# Patient Record
Sex: Female | Born: 1945 | Race: White | Hispanic: No | Marital: Married | Smoking: Current every day smoker
Health system: Southern US, Community
[De-identification: ages and names within clinical notes are randomized; demographics above are authoritative.]

## PROBLEM LIST (undated history)

## (undated) DIAGNOSIS — Z720 Tobacco use: Secondary | ICD-10-CM

## (undated) DIAGNOSIS — M1710 Unilateral primary osteoarthritis, unspecified knee: Secondary | ICD-10-CM

## (undated) DIAGNOSIS — I10 Essential (primary) hypertension: Secondary | ICD-10-CM

## (undated) DIAGNOSIS — R945 Abnormal results of liver function studies: Secondary | ICD-10-CM

## (undated) DIAGNOSIS — I2510 Atherosclerotic heart disease of native coronary artery without angina pectoris: Secondary | ICD-10-CM

## (undated) DIAGNOSIS — Z8701 Personal history of pneumonia (recurrent): Secondary | ICD-10-CM

## (undated) DIAGNOSIS — F1010 Alcohol abuse, uncomplicated: Secondary | ICD-10-CM

## (undated) DIAGNOSIS — I219 Acute myocardial infarction, unspecified: Secondary | ICD-10-CM

## (undated) DIAGNOSIS — M179 Osteoarthritis of knee, unspecified: Secondary | ICD-10-CM

## (undated) DIAGNOSIS — Q396 Congenital diverticulum of esophagus: Secondary | ICD-10-CM

## (undated) DIAGNOSIS — E785 Hyperlipidemia, unspecified: Secondary | ICD-10-CM

## (undated) DIAGNOSIS — J439 Emphysema, unspecified: Secondary | ICD-10-CM

## (undated) DIAGNOSIS — J449 Chronic obstructive pulmonary disease, unspecified: Secondary | ICD-10-CM

## (undated) DIAGNOSIS — M1990 Unspecified osteoarthritis, unspecified site: Secondary | ICD-10-CM

## (undated) DIAGNOSIS — R7989 Other specified abnormal findings of blood chemistry: Secondary | ICD-10-CM

## (undated) DIAGNOSIS — Z8679 Personal history of other diseases of the circulatory system: Secondary | ICD-10-CM

## (undated) DIAGNOSIS — E119 Type 2 diabetes mellitus without complications: Secondary | ICD-10-CM

## (undated) DIAGNOSIS — C50919 Malignant neoplasm of unspecified site of unspecified female breast: Secondary | ICD-10-CM

## (undated) DIAGNOSIS — G459 Transient cerebral ischemic attack, unspecified: Secondary | ICD-10-CM

## (undated) HISTORY — DX: Malignant neoplasm of unspecified site of unspecified female breast: C50.919

## (undated) HISTORY — DX: Hyperlipidemia, unspecified: E78.5

## (undated) HISTORY — DX: Acute myocardial infarction, unspecified: I21.9

## (undated) HISTORY — PX: MASTECTOMY: SHX3

## (undated) HISTORY — DX: Abnormal results of liver function studies: R94.5

## (undated) HISTORY — DX: Osteoarthritis of knee, unspecified: M17.9

## (undated) HISTORY — PX: EYE SURGERY: SHX253

## (undated) HISTORY — DX: Alcohol abuse, uncomplicated: F10.10

## (undated) HISTORY — DX: Essential (primary) hypertension: I10

## (undated) HISTORY — DX: Unspecified osteoarthritis, unspecified site: M19.90

## (undated) HISTORY — DX: Atherosclerotic heart disease of native coronary artery without angina pectoris: I25.10

## (undated) HISTORY — DX: Chronic obstructive pulmonary disease, unspecified: J44.9

## (undated) HISTORY — PX: TONSILLECTOMY AND ADENOIDECTOMY: SUR1326

## (undated) HISTORY — PX: HYSTEROSCOPY: SHX211

## (undated) HISTORY — DX: Tobacco use: Z72.0

## (undated) HISTORY — DX: Other specified abnormal findings of blood chemistry: R79.89

## (undated) HISTORY — DX: Unilateral primary osteoarthritis, unspecified knee: M17.10

---

## 1979-09-16 HISTORY — PX: VULVECTOMY: SHX1086

## 1989-09-15 HISTORY — PX: BREAST SURGERY: SHX581

## 1998-08-15 HISTORY — PX: CORONARY ANGIOPLASTY: SHX604

## 1998-08-19 ENCOUNTER — Encounter: Payer: Self-pay | Admitting: Emergency Medicine

## 1998-08-19 ENCOUNTER — Inpatient Hospital Stay (HOSPITAL_COMMUNITY): Admission: EM | Admit: 1998-08-19 | Discharge: 1998-08-22 | Payer: Self-pay | Admitting: Emergency Medicine

## 1998-09-03 ENCOUNTER — Ambulatory Visit (HOSPITAL_COMMUNITY): Admission: RE | Admit: 1998-09-03 | Discharge: 1998-09-03 | Payer: Self-pay | Admitting: Cardiology

## 1998-09-15 DIAGNOSIS — I219 Acute myocardial infarction, unspecified: Secondary | ICD-10-CM

## 1998-09-15 HISTORY — DX: Acute myocardial infarction, unspecified: I21.9

## 1998-10-19 ENCOUNTER — Inpatient Hospital Stay (HOSPITAL_COMMUNITY): Admission: EM | Admit: 1998-10-19 | Discharge: 1998-10-23 | Payer: Self-pay | Admitting: Emergency Medicine

## 1998-10-19 ENCOUNTER — Encounter: Payer: Self-pay | Admitting: Cardiology

## 1999-01-14 HISTORY — PX: CORONARY ANGIOPLASTY WITH STENT PLACEMENT: SHX49

## 1999-01-25 ENCOUNTER — Observation Stay (HOSPITAL_COMMUNITY): Admission: AD | Admit: 1999-01-25 | Discharge: 1999-01-26 | Payer: Self-pay | Admitting: Cardiovascular Disease

## 1999-07-17 HISTORY — PX: CORONARY ANGIOPLASTY WITH STENT PLACEMENT: SHX49

## 1999-07-19 ENCOUNTER — Ambulatory Visit (HOSPITAL_COMMUNITY): Admission: RE | Admit: 1999-07-19 | Discharge: 1999-07-20 | Payer: Self-pay | Admitting: Cardiology

## 1999-08-27 ENCOUNTER — Encounter (HOSPITAL_COMMUNITY): Admission: RE | Admit: 1999-08-27 | Discharge: 1999-11-25 | Payer: Self-pay | Admitting: Cardiology

## 1999-12-13 ENCOUNTER — Encounter: Admission: RE | Admit: 1999-12-13 | Discharge: 1999-12-13 | Payer: Self-pay | Admitting: Family Medicine

## 1999-12-13 ENCOUNTER — Encounter: Payer: Self-pay | Admitting: Family Medicine

## 2000-02-10 ENCOUNTER — Inpatient Hospital Stay (HOSPITAL_COMMUNITY): Admission: EM | Admit: 2000-02-10 | Discharge: 2000-02-12 | Payer: Self-pay | Admitting: *Deleted

## 2000-02-10 ENCOUNTER — Encounter: Payer: Self-pay | Admitting: *Deleted

## 2000-05-25 ENCOUNTER — Encounter (INDEPENDENT_AMBULATORY_CARE_PROVIDER_SITE_OTHER): Payer: Self-pay

## 2000-05-25 ENCOUNTER — Other Ambulatory Visit: Admission: RE | Admit: 2000-05-25 | Discharge: 2000-05-25 | Payer: Self-pay | Admitting: Obstetrics and Gynecology

## 2000-09-15 HISTORY — PX: PELVIC LAPAROSCOPY: SHX162

## 2001-08-05 ENCOUNTER — Encounter: Payer: Self-pay | Admitting: Family Medicine

## 2001-08-05 ENCOUNTER — Encounter: Admission: RE | Admit: 2001-08-05 | Discharge: 2001-08-05 | Payer: Self-pay | Admitting: Family Medicine

## 2001-08-09 ENCOUNTER — Other Ambulatory Visit: Admission: RE | Admit: 2001-08-09 | Discharge: 2001-08-09 | Payer: Self-pay | Admitting: Obstetrics and Gynecology

## 2001-09-03 ENCOUNTER — Encounter (INDEPENDENT_AMBULATORY_CARE_PROVIDER_SITE_OTHER): Payer: Self-pay

## 2001-09-03 ENCOUNTER — Ambulatory Visit (HOSPITAL_COMMUNITY): Admission: RE | Admit: 2001-09-03 | Discharge: 2001-09-03 | Payer: Self-pay | Admitting: Obstetrics and Gynecology

## 2002-08-18 ENCOUNTER — Inpatient Hospital Stay (HOSPITAL_COMMUNITY): Admission: EM | Admit: 2002-08-18 | Discharge: 2002-08-19 | Payer: Self-pay

## 2002-08-18 ENCOUNTER — Encounter: Payer: Self-pay | Admitting: Emergency Medicine

## 2002-09-29 ENCOUNTER — Encounter: Admission: RE | Admit: 2002-09-29 | Discharge: 2002-09-29 | Payer: Self-pay | Admitting: Obstetrics and Gynecology

## 2002-09-29 ENCOUNTER — Encounter: Payer: Self-pay | Admitting: Obstetrics and Gynecology

## 2004-06-13 ENCOUNTER — Inpatient Hospital Stay (HOSPITAL_COMMUNITY): Admission: EM | Admit: 2004-06-13 | Discharge: 2004-06-17 | Payer: Self-pay | Admitting: Emergency Medicine

## 2004-11-29 ENCOUNTER — Ambulatory Visit (HOSPITAL_COMMUNITY): Admission: RE | Admit: 2004-11-29 | Discharge: 2004-11-29 | Payer: Self-pay | Admitting: Family Medicine

## 2005-01-31 HISTORY — PX: CARDIOVASCULAR STRESS TEST: SHX262

## 2005-04-11 ENCOUNTER — Encounter: Admission: RE | Admit: 2005-04-11 | Discharge: 2005-04-11 | Payer: Self-pay | Admitting: Obstetrics and Gynecology

## 2008-10-20 ENCOUNTER — Inpatient Hospital Stay (HOSPITAL_COMMUNITY): Admission: EM | Admit: 2008-10-20 | Discharge: 2008-10-22 | Payer: Self-pay | Admitting: Emergency Medicine

## 2009-04-04 ENCOUNTER — Inpatient Hospital Stay (HOSPITAL_COMMUNITY): Admission: EM | Admit: 2009-04-04 | Discharge: 2009-04-05 | Payer: Self-pay | Admitting: Emergency Medicine

## 2009-04-05 HISTORY — PX: CARDIAC CATHETERIZATION: SHX172

## 2009-05-30 ENCOUNTER — Inpatient Hospital Stay (HOSPITAL_COMMUNITY): Admission: RE | Admit: 2009-05-30 | Discharge: 2009-05-31 | Payer: Self-pay | Admitting: Orthopedic Surgery

## 2009-09-13 ENCOUNTER — Encounter: Admission: RE | Admit: 2009-09-13 | Discharge: 2009-09-13 | Payer: Self-pay | Admitting: Orthopedic Surgery

## 2009-09-15 HISTORY — PX: OTHER SURGICAL HISTORY: SHX169

## 2010-04-02 ENCOUNTER — Ambulatory Visit: Payer: Self-pay | Admitting: Gynecology

## 2010-04-02 ENCOUNTER — Other Ambulatory Visit: Admission: RE | Admit: 2010-04-02 | Discharge: 2010-04-02 | Payer: Self-pay | Admitting: Gynecology

## 2010-04-02 ENCOUNTER — Encounter: Admission: RE | Admit: 2010-04-02 | Discharge: 2010-04-02 | Payer: Self-pay | Admitting: Family Medicine

## 2010-08-15 HISTORY — PX: TOTAL HIP ARTHROPLASTY: SHX124

## 2010-08-27 ENCOUNTER — Inpatient Hospital Stay (HOSPITAL_COMMUNITY)
Admission: RE | Admit: 2010-08-27 | Discharge: 2010-08-31 | Payer: Self-pay | Source: Home / Self Care | Attending: Orthopedic Surgery | Admitting: Orthopedic Surgery

## 2010-09-24 ENCOUNTER — Inpatient Hospital Stay (HOSPITAL_COMMUNITY)
Admission: EM | Admit: 2010-09-24 | Discharge: 2010-09-25 | Payer: Self-pay | Source: Home / Self Care | Attending: Orthopedic Surgery | Admitting: Orthopedic Surgery

## 2010-09-30 LAB — DIFFERENTIAL
Basophils Absolute: 0.1 10*3/uL (ref 0.0–0.1)
Basophils Relative: 1 % (ref 0–1)
Eosinophils Absolute: 0.1 10*3/uL (ref 0.0–0.7)
Eosinophils Relative: 1 % (ref 0–5)
Lymphocytes Relative: 24 % (ref 12–46)
Lymphs Abs: 2.3 10*3/uL (ref 0.7–4.0)
Monocytes Absolute: 0.9 10*3/uL (ref 0.1–1.0)
Monocytes Relative: 9 % (ref 3–12)
Neutro Abs: 6.2 10*3/uL (ref 1.7–7.7)
Neutrophils Relative %: 65 % (ref 43–77)

## 2010-09-30 LAB — BASIC METABOLIC PANEL
BUN: 6 mg/dL (ref 6–23)
CO2: 25 mEq/L (ref 19–32)
Calcium: 9.5 mg/dL (ref 8.4–10.5)
Chloride: 103 mEq/L (ref 96–112)
Creatinine, Ser: 0.49 mg/dL (ref 0.4–1.2)
GFR calc Af Amer: >60 mL/min (ref >60)
GFR calc non Af Amer: >60 mL/min (ref >60)
Glucose, Bld: 108 mg/dL — ABNORMAL HIGH (ref 70–99)
Potassium: 3.7 mEq/L (ref 3.5–5.1)
Sodium: 137 mEq/L (ref 135–145)

## 2010-09-30 LAB — GLUCOSE, CAPILLARY
Glucose-Capillary: 105 mg/dL — ABNORMAL HIGH (ref 70–99)
Glucose-Capillary: 144 mg/dL — ABNORMAL HIGH (ref 70–99)
Glucose-Capillary: 129 mg/dL — ABNORMAL HIGH (ref 70–99)

## 2010-09-30 LAB — CBC
HCT: 37.9 % (ref 36.0–46.0)
Hemoglobin: 13 g/dL (ref 12.0–15.0)
MCH: 32.3 pg (ref 26.0–34.0)
MCHC: 34.3 g/dL (ref 30.0–36.0)
MCV: 94 fL (ref 78.0–100.0)
Platelets: 259 10*3/uL (ref 150–400)
RBC: 4.03 MIL/uL (ref 3.87–5.11)
RDW: 14 % (ref 11.5–15.5)
WBC: 9.5 10*3/uL (ref 4.0–10.5)

## 2010-09-30 LAB — TYPE AND SCREEN
ABO/RH(D): O POS
Antibody Screen: NEGATIVE

## 2010-10-04 NOTE — Op Note (Signed)
[name], [name]                ACCOUNT NO.:  [id number]  MEDICAL RECORD NO.:  [id number]          PATIENT TYPE:  INP  LOCATION:  5031                         FACILITY:  [facility]  PHYSICIAN:  Alvy Beal, MD    DATE OF BIRTH:  27-Jun-1946  DATE OF PROCEDURE:  05/30/2009 DATE OF DISCHARGE:                              OPERATIVE REPORT  PREOPERATIVE DIAGNOSIS:  Cervical spondylotic radiculopathy.  POSTOPERATIVE DIAGNOSIS:  Cervical spondylotic radiculopathy.  OPERATIVE PROCEDURE:  Anterior cervical corpectomy of C5 with anterior cervical strut graft fusion using fibular allograft and an anterior cervical Synthes vector plate for D6-U4 fusion.  COMPLICATIONS:  None.  Evoked motor and sensory potentials showed some improvement on the left side.  Post decompression, no adverse intraoperative neuro monitoring events with evoked motor sensory and potentials as well as free running EMGs.  FIRST ASSISTANT:  Marlaine Hind.  HISTORY:  [name] is a very pleasant 65 year old woman who has been having progressive neck and radicular right arm pain and weakness.  Clinical and radiographic analysis confirmed the diagnosis of 2-level degenerative disk disease with significant posterior osteophyte behind the vertebral body of C5.  After discussing treatment options, the decision was made to be taken to the operating room for corpectomy. Because of the posterior vertebral location of the bone spur causing the right radicular arm pain, the decision was made to taken for corpectomy rather than diskectomy as I did not feel as though I could adequately decompress the nerve by doing just diskectomies alone.  This was explained to the patient and her family.  Consent was obtained and I did review all appropriate risks, benefits, and alternatives.  OPERATIVE NOTE:  The patient was brought to the operating room, placed supine on the operating table.  After successful induction of  general anesthesia, and an endotracheal intubation, TEDs, SCDs, and Foley were inserted, and all appropriate neuro monitoring devices were applied. The arms were tucked at the side and taped down.  A chin strap was applied.  The neck was prepped and draped in a standard fashion.  An appropriate pre-incision time-out was done confirming patient, procedure, and extremity of pain.  Once this was done, an incision was made.  The left lateral longitudinal incision was made along the medial border of the sternocleidomastoid.  Sharp dissection was carried out down to and through the platysma.  I continued to sharply dissect in the deep cervical fascia, sweeping the omohyoid medially as well as the trachea and esophagus.  I was able to visualize the carotid sheath, protected with finger and continued my sharp dissection to completely expose the prevertebral fascia.  At this point, an appendiceal retractor was placed and retracted the trachea and esophagus medially and I used a Pension scheme manager to remove the remaining prevertebral fascia to expose the anterior longitudinal ligament from the midbody of C4 down to the midbody of C6.  I then placed a needle into the 4-5 disk space, took an intraoperative x-ray and confirmed I was at the appropriate level.  Once this was confirmed, I used a bipolar electrocautery to begin to mobilize the longus colli muscles.  I mobilized them out lateral until I could see the uncovertebral joints at C4-5 and 5-6.  I then placed self- retaining Caspar retractor underneath the longus colli muscles and deflated the endotracheal cuff.  I expanded them to the appropriate width and reinflated the endotracheal cuff.  Distraction pins were placed into the bodies of C4 and C6 and I distracted the overall construct.  I incised the 4-5 disk with a 15- blade scalpel to proceed with my diskectomy.  Using pituitary rongeurs and curettes, and Kerrison rongeurs, I removed the  entire 4-5 disk material.  Once I had the majority of the diskectomy complete, I repeated the same diskectomy at C5-6.  This allowed me to complete the diskectomies above and below my corpectomy level.  I used a double-action Leksell rongeur to remove the bulk of the C5 vertebral body.  Once this was done, I used a high-speed bur to continue to create my channel corpectomy.  I measured using a neuro pattern to ensure I had an adequate width.  Once I had the width, I continued to resect the central portion of the vertebral body creating my channel corpectomy.  Once I was down to the posterior cortex, I proceeded with using a Kerrison rongeur.  At the disk 4-5 disk space, I swept a neuro-hook underneath the endplate of C5 and then placed my #1 Kerrison and began removing the posterior cortex of the C5.  Using technique of sweeping underneath the cortex with a nerve hook and then resecting with a 1-mm Kerrison, I removed the entire posterior cortex of C5.  I then developed a plane underneath the posterior longitudinal ligament and began resecting the posterior longitudinal ligament.  There was significant osteophyte along the right lateral gutter, starting about the midportion of C5 and proceeding inferiorly towards C6.  I resected all of this.  I then visualized the uncovertebral joints and undercut the C5 corpectomy channel, so that I decompressed out laterally as well.  At this point, I could freely sweep a micro nerve hook along the anterior aspect of the thecal sac in the right lateral gutter from C4 down to C6.  Using a similar technique, I resected the posterior longitudinal ligament with 1-mm Kerrison.  I decompressed the left side as well.  At this point, I had a complete decompression from C4-C6 both centrally and in the lateral recess.  I then ran another set of evoked motor sensory potentials as well as EMGs and they were satisfactory.  I then cut and contoured a 22-mm  length fibular strut graft, packed it with the local bone I had harvested from the decompression and contoured it using a high-speed bur.  Once it was properly fashioned, I gently tapped it down into the corpectomy channel. I copiously irrigated with normal saline.  It should be noted that prior to placing the graft, I did ensure had an adequate hemostasis and there was no active bleeding.  Once I had the graft secured, I rechecked my neuro potentials, confirmed satisfactory, no adverse events.  I then removed the distraction as well as the chin strap distraction, and then measured and contoured the anterior cervical plate.  Using 16-mm variable angle screws, I secured the plate to the C4 and C6 vertebral bodies without complication.  I then checked to ensure that the esophagus was not entrapped beneath the plate and it was not.  I then irrigated copiously, returns trachea esophagus to midline, and took final intraoperative AP and lateral fluoro views.  I had an adequate corpectomy.  The graft was well positioned and it was secured.  The hardware was also in adequate position.  At this point with the graft and hardware positioned appropriately, I then closed the platysma using interrupted 2-0 Vicryl sutures and the skin with 3-0 Monocryl.  Steri-Strips and a dry dressing were applied. The patient was extubated, transferred to the PACU without incident.  At the end of the case, all needle and sponge counts were correct.  The patient tolerated the procedure well.    Alvy Beal, MD Electronically Signed   DDB/MEDQ  D:  05/30/2009  T:  05/31/2009  Job:  [id number]  Electronically Signed by Venita Lick MD on 10/03/2010 08:45:48 PM

## 2010-10-06 ENCOUNTER — Encounter: Payer: Self-pay | Admitting: Physical Medicine and Rehabilitation

## 2010-10-07 ENCOUNTER — Ambulatory Visit (HOSPITAL_COMMUNITY)
Admission: EM | Admit: 2010-10-07 | Discharge: 2010-10-08 | Payer: Self-pay | Source: Home / Self Care | Attending: Orthopedic Surgery | Admitting: Orthopedic Surgery

## 2010-10-08 LAB — BASIC METABOLIC PANEL
BUN: 7 mg/dL (ref 6–23)
Chloride: 102 mEq/L (ref 96–112)
Creatinine, Ser: 0.8 mg/dL (ref 0.4–1.2)
GFR calc Af Amer: >60 mL/min (ref >60)
GFR calc non Af Amer: >60 mL/min (ref >60)
Potassium: 3.9 mEq/L (ref 3.5–5.1)

## 2010-10-08 LAB — CBC
MCH: 31.5 pg (ref 26.0–34.0)
MCV: 89.7 fL (ref 78.0–100.0)
Platelets: 321 10*3/uL (ref 150–400)
RBC: 4.45 MIL/uL (ref 3.87–5.11)
RDW: 13.8 % (ref 11.5–15.5)
WBC: 10.2 10*3/uL (ref 4.0–10.5)

## 2010-10-08 LAB — PROTIME-INR
INR: 1.05 (ref 0.00–1.49)
Prothrombin Time: 13.9 seconds (ref 11.6–15.2)

## 2010-10-08 LAB — DIFFERENTIAL
Basophils Relative: 0 % (ref 0–1)
Eosinophils Absolute: 0.1 10*3/uL (ref 0.0–0.7)
Eosinophils Relative: 1 % (ref 0–5)
Lymphs Abs: 1.5 10*3/uL (ref 0.7–4.0)
Neutrophils Relative %: 74 % (ref 43–77)

## 2010-10-14 NOTE — Op Note (Signed)
NAMESONTEE, ROUNDTREE                ACCOUNT NO.:  [id number]  MEDICAL RECORD NO.:  [id number]          PATIENT TYPE:  INP  LOCATION:  1618                         FACILITY:  [facility]  PHYSICIAN:  Georges Lynch. Angelyse Heslin, M.D.DATE OF BIRTH:  [birth year]  DATE OF PROCEDURE: DATE OF DISCHARGE:                              OPERATIVE REPORT   ADDENDUM:  ASSISTANT:  Jene Every, M.D.          ______________________________ Georges Lynch. Darrelyn Hillock, M.D.     RAG/MEDQ  D:  10/07/2010  T:  10/08/2010  Job:  [id number]  Electronically Signed by Ranee Gosselin M.D. on 10/14/2010 08:06:03 AM

## 2010-10-14 NOTE — Op Note (Signed)
NAMEMAYDEAN, [name]                ACCOUNT NO.:  [id number]  MEDICAL RECORD NO.:  [id number]          PATIENT TYPE:  INP  LOCATION:  1618                         FACILITY:  [facility]  PHYSICIAN:  Georges Lynch. 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  OPERATION:  Closed reduction of a dislocated right total hip.  PROCEDURE:  Under general anesthesia, appropriate time-out was carried out.  I marked the appropriate leg in the holding area.  Once we went through the time-out procedure, I did a gentle closed manipulation of the right anterior, dislocated hip.  The hip came back in with ease.  I then placed her knee immobilizer and extension.  AP x-ray of the hip was taken post reduction and showed excellent position.  There were no fractures.  No disturbances of the prosthesis per se.  The patient will be admitted and kept overnight.          ______________________________ Georges Lynch. Darrelyn Hillock, M.D.     RAG/MEDQ  D:  10/07/2010  T:  10/07/2010  Job:  [id number]  cc:   Madlyn Frankel Charlann Boxer, M.D. Fax: [phone]  Electronically Signed by Ranee Gosselin M.D. on 10/14/2010 08:06:01 AM

## 2010-11-25 LAB — SURGICAL PCR SCREEN: MRSA, PCR: NEGATIVE

## 2010-11-25 LAB — CBC
HCT: 25.4 % — ABNORMAL LOW (ref 36.0–46.0)
Hemoglobin: 8.6 g/dL — ABNORMAL LOW (ref 12.0–15.0)
MCH: 31 pg (ref 26.0–34.0)
MCH: 31.7 pg (ref 26.0–34.0)
MCHC: 33.9 g/dL (ref 30.0–36.0)
MCHC: 34.8 g/dL (ref 30.0–36.0)
MCHC: 35.1 g/dL (ref 30.0–36.0)
MCV: 91.7 fL (ref 78.0–100.0)
MCV: 90.3 fL (ref 78.0–100.0)
Platelets: 244 10*3/uL (ref 150–400)
Platelets: 259 10*3/uL (ref 150–400)
RBC: 2.77 MIL/uL — ABNORMAL LOW (ref 3.87–5.11)
RDW: 13.4 % (ref 11.5–15.5)
RDW: 13.4 % (ref 11.5–15.5)

## 2010-11-25 LAB — BASIC METABOLIC PANEL
BUN: 6 mg/dL (ref 6–23)
BUN: 7 mg/dL (ref 6–23)
CO2: 28 mEq/L (ref 19–32)
CO2: 30 mEq/L (ref 19–32)
Calcium: 7.9 mg/dL — ABNORMAL LOW (ref 8.4–10.5)
Calcium: 9.5 mg/dL (ref 8.4–10.5)
Chloride: 97 mEq/L (ref 96–112)
Chloride: 99 mEq/L (ref 96–112)
Creatinine, Ser: 0.64 mg/dL (ref 0.4–1.2)
GFR calc Af Amer: >60 mL/min (ref >60)
GFR calc Af Amer: >60 mL/min (ref >60)
GFR calc non Af Amer: >60 mL/min (ref >60)
Glucose, Bld: 160 mg/dL — ABNORMAL HIGH (ref 70–99)
Glucose, Bld: 157 mg/dL — ABNORMAL HIGH (ref 70–99)
Glucose, Bld: 94 mg/dL (ref 70–99)
Sodium: 130 mEq/L — ABNORMAL LOW (ref 135–145)

## 2010-11-25 LAB — DIFFERENTIAL
Basophils Absolute: 0 10*3/uL (ref 0.0–0.1)
Basophils Relative: 1 % (ref 0–1)
Eosinophils Absolute: 0.2 10*3/uL (ref 0.0–0.7)
Eosinophils Relative: 2 % (ref 0–5)
Lymphs Abs: 2.3 10*3/uL (ref 0.7–4.0)
Neutrophils Relative %: 59 % (ref 43–77)

## 2010-11-25 LAB — PROTIME-INR
INR: 1 (ref 0.00–1.49)
Prothrombin Time: 13.4 seconds (ref 11.6–15.2)

## 2010-11-25 LAB — GLUCOSE, CAPILLARY
Glucose-Capillary: 143 mg/dL — ABNORMAL HIGH (ref 70–99)
Glucose-Capillary: 149 mg/dL — ABNORMAL HIGH (ref 70–99)
Glucose-Capillary: 152 mg/dL — ABNORMAL HIGH (ref 70–99)
Glucose-Capillary: 173 mg/dL — ABNORMAL HIGH (ref 70–99)
Glucose-Capillary: 157 mg/dL — ABNORMAL HIGH (ref 70–99)
Glucose-Capillary: 162 mg/dL — ABNORMAL HIGH (ref 70–99)
Glucose-Capillary: 195 mg/dL — ABNORMAL HIGH (ref 70–99)

## 2010-11-25 LAB — URINE MICROSCOPIC-ADD ON

## 2010-11-25 LAB — URINALYSIS, ROUTINE W REFLEX MICROSCOPIC
Bilirubin Urine: NEGATIVE
Glucose, UA: NEGATIVE mg/dL
Nitrite: NEGATIVE
Specific Gravity, Urine: 1.016 (ref 1.005–1.030)
pH: 7 (ref 5.0–8.0)

## 2010-11-25 LAB — ABO/RH: ABO/RH(D): O POS

## 2010-11-25 LAB — TYPE AND SCREEN: Antibody Screen: NEGATIVE

## 2010-12-09 NOTE — Discharge Summary (Signed)
NAMESHAMARRA, [name]                ACCOUNT NO.:  [id number]  MEDICAL RECORD NO.:  [id number]          PATIENT TYPE:  INP  LOCATION:  1338                         [facility]ACILITY:  [facility]  PHYSICIAN:  Madlyn Frankel. Charlann Boxer, M.D.  DATE OF BIRTH:  1946/06/03  DATE OF ADMISSION:  09/24/2010 DATE OF DISCHARGE:  09/25/2010                              DISCHARGE SUMMARY   ADMITTING HISTORY:  Right hip hematoma with wound drainage.  POSTOPERATIVE DIAGNOSES: 1. Right hip hematoma, following right total hip replacement,     performed on December 13th where she underwent bilateral total     hips. 2. Chronic bronchitis and pneumonia history. 3. History of chronic obstructive pulmonary disease. 4. Hypertension. 5. Coronary artery disease 6. History of myocardial infarction in 2000. 7. History of hypercholesterolemia. 8. Coronary stents. 9. Urinary incontinence. 10.Diabetes. 11.Degenerative disk disease. 12.History of staph and strep infection. 13.History of chronic pain issues.  BRIEF HISTORY:  [name] is a 65 year old female who underwent a bilateral total hip replacements on August 27, 2010.  She had initially done well, but developed some swelling of right thigh with some wound drainage.  She was seen and evaluated in the office and I felt it was important to take her to the operating room for a formal I and D and washing this out to prevent further concerns of infection after reviewing these wrist surgery was scheduled.  HOSPITAL COURSE:  The patient admitted for same-day surgery on September 24, 2010.  She underwent an I and D of the right hip.  Please see dictated operative note for details of the procedure as well as findings.  Postprocedure, she was transferred to the recovery room and then to the orthopedic ward.  On postop day #1, she was already begun to go home.  Her Hemovac and Foley catheter were removed.  Her labs and vital signs were stable.  Her dressing was dry.  She expressed  the desire to go home.  DISCHARGE INSTRUCTIONS:  She should keep her wound dry until followup in 2 weeks.  She will follow up with Dr. Durene Romans, [facility] in 2 weeks for wound check and evaluation.  If any other questions were to come up, she can contact the office.  DISCHARGE MEDICATIONS: 1. Bactrim DS 1 tablet b.i.d. for 2 weeks until followup. 2. Colace 100 mg p.o. b.i.d. for constipation. 3. Oxycodone 5 mg 1-3 tablets every 4 hours as needed for pain. 4. Albuterol 2 puffs every 6 h. as needed. 5. Aspirin 325 mg q.a.m. 6. Diltiazem 180 mg q.a.m. 7. Furosemide 40 mg q.a.m. 8. Gabapentin 500 mg q.6 h. 9. Lantus insulin 25 units subcu daily at bedtime. 10.Losartan 25 mg q.a.m. 11.Metformin 500 mg b.i.d. 12.Metoprolol 100 mg p.o. daily. 13.Morphine sulfate 30 mg q.i.d. as needed. 14.Robaxin 500 mg q.6 as needed for muscle spasm pain. 15.Simvastatin 40 mg nightly. 16.Trilipix 135 mg daily. 17.Xanax 0.25 mg 1 tablet every 8 hours as needed for pain.  Questions were encouraged and was reviewed at the time of her discharge.     Madlyn Frankel Charlann Boxer, M.D.     MDO/MEDQ  D:  12/08/2010  T:  12/09/2010  Job:  [id number]  Electronically Signed by Durene Romans M.D. on 12/09/2010 10:34:00 AM

## 2010-12-20 LAB — BASIC METABOLIC PANEL
BUN: 7 mg/dL (ref 6–23)
Creatinine, Ser: 0.53 mg/dL (ref 0.4–1.2)
GFR calc non Af Amer: >60 mL/min (ref >60)
Glucose, Bld: 126 mg/dL — ABNORMAL HIGH (ref 70–99)
Potassium: 4.5 mEq/L (ref 3.5–5.1)

## 2010-12-20 LAB — GLUCOSE, CAPILLARY
Glucose-Capillary: 148 mg/dL — ABNORMAL HIGH (ref 70–99)
Glucose-Capillary: 162 mg/dL — ABNORMAL HIGH (ref 70–99)
Glucose-Capillary: 114 mg/dL — ABNORMAL HIGH (ref 70–99)
Glucose-Capillary: 132 mg/dL — ABNORMAL HIGH (ref 70–99)

## 2010-12-20 LAB — CBC
HCT: 44.3 % (ref 36.0–46.0)
Platelets: 232 10*3/uL (ref 150–400)
RDW: 13.5 % (ref 11.5–15.5)

## 2010-12-22 LAB — CBC
RBC: 4.93 MIL/uL (ref 3.87–5.11)
WBC: 8.8 10*3/uL (ref 4.0–10.5)

## 2010-12-22 LAB — BASIC METABOLIC PANEL
Calcium: 9.5 mg/dL (ref 8.4–10.5)
Creatinine, Ser: 0.53 mg/dL (ref 0.4–1.2)
GFR calc Af Amer: >60 mL/min (ref >60)
Sodium: 139 mEq/L (ref 135–145)

## 2010-12-22 LAB — POCT CARDIAC MARKERS
CKMB, poc: <1 ng/mL — ABNORMAL LOW (ref 1.0–8.0)
Myoglobin, poc: 36.9 ng/mL (ref 12–200)
Myoglobin, poc: 52.9 ng/mL (ref 12–200)
Troponin i, poc: <0.05 ng/mL (ref 0.00–0.09)

## 2010-12-22 LAB — DIFFERENTIAL
Lymphocytes Relative: 24 % (ref 12–46)
Lymphs Abs: 2.1 10*3/uL (ref 0.7–4.0)
Monocytes Relative: 7 % (ref 3–12)
Neutro Abs: 6 10*3/uL (ref 1.7–7.7)
Neutrophils Relative %: 68 % (ref 43–77)

## 2010-12-22 LAB — GLUCOSE, CAPILLARY
Glucose-Capillary: 133 mg/dL — ABNORMAL HIGH (ref 70–99)
Glucose-Capillary: 145 mg/dL — ABNORMAL HIGH (ref 70–99)
Glucose-Capillary: 175 mg/dL — ABNORMAL HIGH (ref 70–99)

## 2010-12-22 LAB — CK TOTAL AND CKMB (NOT AT ARMC)
CK, MB: 1.1 ng/mL (ref 0.3–4.0)
Relative Index: INVALID (ref 0.0–2.5)
Relative Index: INVALID (ref 0.0–2.5)
Total CK: 87 U/L (ref 7–177)

## 2010-12-22 LAB — APTT: aPTT: 28 seconds (ref 24–37)

## 2010-12-22 LAB — TROPONIN I: Troponin I: 0.01 ng/mL (ref 0.00–0.06)

## 2010-12-22 LAB — PROTIME-INR: INR: 1 (ref 0.00–1.49)

## 2010-12-31 LAB — URINALYSIS, ROUTINE W REFLEX MICROSCOPIC
Bilirubin Urine: NEGATIVE
Glucose, UA: NEGATIVE mg/dL
Ketones, ur: NEGATIVE mg/dL
pH: 6 (ref 5.0–8.0)

## 2010-12-31 LAB — BASIC METABOLIC PANEL
CO2: 25 mEq/L (ref 19–32)
Chloride: 97 mEq/L (ref 96–112)
GFR calc Af Amer: >60 mL/min (ref >60)
Potassium: 3.5 mEq/L (ref 3.5–5.1)
Sodium: 133 mEq/L — ABNORMAL LOW (ref 135–145)

## 2010-12-31 LAB — COMPREHENSIVE METABOLIC PANEL
ALT: 38 U/L — ABNORMAL HIGH (ref 0–35)
AST: 34 U/L (ref 0–37)
Albumin: 3.6 g/dL (ref 3.5–5.2)
CO2: 24 mEq/L (ref 19–32)
Calcium: 9 mg/dL (ref 8.4–10.5)
GFR calc Af Amer: >60 mL/min (ref >60)
Sodium: 136 mEq/L (ref 135–145)
Total Protein: 7 g/dL (ref 6.0–8.3)

## 2010-12-31 LAB — GLUCOSE, CAPILLARY
Glucose-Capillary: 110 mg/dL — ABNORMAL HIGH (ref 70–99)
Glucose-Capillary: 139 mg/dL — ABNORMAL HIGH (ref 70–99)
Glucose-Capillary: 164 mg/dL — ABNORMAL HIGH (ref 70–99)

## 2010-12-31 LAB — HEPATIC FUNCTION PANEL
ALT: 48 U/L — ABNORMAL HIGH (ref 0–35)
AST: 35 U/L (ref 0–37)
Bilirubin, Direct: 0.2 mg/dL (ref 0.0–0.3)

## 2010-12-31 LAB — DIFFERENTIAL
Basophils Relative: 1 % (ref 0–1)
Eosinophils Absolute: 0.3 10*3/uL (ref 0.0–0.7)
Monocytes Absolute: 1.1 10*3/uL — ABNORMAL HIGH (ref 0.1–1.0)
Monocytes Relative: 10 % (ref 3–12)
Neutro Abs: 7.7 10*3/uL (ref 1.7–7.7)

## 2010-12-31 LAB — CBC
Hemoglobin: 14.6 g/dL (ref 12.0–15.0)
MCHC: 34.2 g/dL (ref 30.0–36.0)
MCV: 94.5 fL (ref 78.0–100.0)
RBC: 4.5 MIL/uL (ref 3.87–5.11)

## 2010-12-31 LAB — CK TOTAL AND CKMB (NOT AT ARMC): Relative Index: 1.3 (ref 0.0–2.5)

## 2010-12-31 LAB — CARDIAC PANEL(CRET KIN+CKTOT+MB+TROPI): Relative Index: 1 (ref 0.0–2.5)

## 2011-01-05 ENCOUNTER — Observation Stay (HOSPITAL_COMMUNITY)
Admission: EM | Admit: 2011-01-05 | Discharge: 2011-01-06 | DRG: 561 | Disposition: A | Discharge status: Home / Self Care | Payer: Managed Care, Other (non HMO) | Attending: Orthopedic Surgery | Admitting: Orthopedic Surgery

## 2011-01-05 ENCOUNTER — Emergency Department (HOSPITAL_COMMUNITY): Payer: Managed Care, Other (non HMO)

## 2011-01-05 DIAGNOSIS — I10 Essential (primary) hypertension: Secondary | ICD-10-CM | POA: Diagnosis present

## 2011-01-05 DIAGNOSIS — Y92009 Unspecified place in unspecified non-institutional (private) residence as the place of occurrence of the external cause: Secondary | ICD-10-CM

## 2011-01-05 DIAGNOSIS — T84029A Dislocation of unspecified internal joint prosthesis, initial encounter: Principal | ICD-10-CM | POA: Diagnosis present

## 2011-01-05 DIAGNOSIS — I2510 Atherosclerotic heart disease of native coronary artery without angina pectoris: Secondary | ICD-10-CM | POA: Diagnosis present

## 2011-01-05 DIAGNOSIS — I252 Old myocardial infarction: Secondary | ICD-10-CM

## 2011-01-05 DIAGNOSIS — X500XXA Overexertion from strenuous movement or load, initial encounter: Secondary | ICD-10-CM | POA: Diagnosis present

## 2011-01-05 DIAGNOSIS — J4489 Other specified chronic obstructive pulmonary disease: Secondary | ICD-10-CM | POA: Diagnosis present

## 2011-01-05 DIAGNOSIS — Z79899 Other long term (current) drug therapy: Secondary | ICD-10-CM | POA: Insufficient documentation

## 2011-01-05 DIAGNOSIS — E119 Type 2 diabetes mellitus without complications: Secondary | ICD-10-CM | POA: Diagnosis present

## 2011-01-05 DIAGNOSIS — Z96649 Presence of unspecified artificial hip joint: Secondary | ICD-10-CM

## 2011-01-05 DIAGNOSIS — J449 Chronic obstructive pulmonary disease, unspecified: Secondary | ICD-10-CM | POA: Diagnosis present

## 2011-01-06 ENCOUNTER — Emergency Department (HOSPITAL_COMMUNITY): Payer: Managed Care, Other (non HMO)

## 2011-01-06 LAB — GLUCOSE, CAPILLARY

## 2011-01-21 ENCOUNTER — Other Ambulatory Visit: Payer: Self-pay | Admitting: Cardiology

## 2011-01-21 NOTE — Telephone Encounter (Signed)
escribe medication per fax request  

## 2011-01-24 ENCOUNTER — Telehealth: Payer: Self-pay | Admitting: Cardiology

## 2011-01-24 NOTE — Telephone Encounter (Signed)
Fax: [phone] Ov, cath, stress, echo, ekg

## 2011-01-28 ENCOUNTER — Other Ambulatory Visit: Payer: Self-pay | Admitting: *Deleted

## 2011-01-28 ENCOUNTER — Other Ambulatory Visit: Payer: Self-pay | Admitting: Cardiology

## 2011-01-28 MED ORDER — SIMVASTATIN 10 MG PO TABS: 10.0000 mg | ORAL_TABLET | Freq: Every evening | ORAL | Status: DC

## 2011-01-28 NOTE — Telephone Encounter (Signed)
escribe medication per fax request  

## 2011-01-28 NOTE — H&P (Signed)
NAMEOLEVA, KIPP                ACCOUNT NO.:  [id number]   MEDICAL RECORD NO.:  [id number]          PATIENT TYPE:  INP   LOCATION:  1425                         FACILITY:  [facility]   PHYSICIAN:  Corinna L. Lendell Caprice, MDDATE OF BIRTH:  03/24/1946   DATE OF ADMISSION:  10/20/2008  DATE OF DISCHARGE:                              HISTORY & PHYSICAL   CHIEF COMPLAINT:  Cough, shortness of breath.   HISTORY OF PRESENT ILLNESS:  Ms. [name] is a 65 year old white female  with multiple medical problems including tobacco abuse and COPD who was  sent to the emergency room by Dr. Corliss Blacker with concerns of worsening  pneumonia, hypoxia and possibly unstable angina.  Her oxygen saturations  were 87% in the office.  The patient has been on Avelox for about 5 days  for pneumonia.  She has gotten no better.  She has been using her  nebulizer treatments.  She was still having productive cough.  She also  has been having bilateral arm aching which is similar to what she felt  with previous angina.  She has not taken any nitroglycerin.  She has had  sick contacts.  She has a lot of sinus congestion.  She has already been  seen by Dr. Swazil[place] who has written orders.   PAST MEDICAL HISTORY:  1. Recurrent pneumonia.  2. Continued tobacco abuse.  3. History of coronary artery disease with stent and angioplasty.      Last Cardiolite 2006 normal.  4. COPD.  5. Type 2 diabetes.  6. Dyslipidemia.  7. Anxiety.  8. Chronic pain.  9. History of lobectomy.  10.Status post right mastectomy for cancer.  11.Remote duodenal ulcer.  12.Hypertension.   MEDICATIONS:  1. Lantus 20 units subcutaneously nightly.  2. OxyContin 40 mg 4 times daily.  3. Gabapentin 600 mg 4 times daily.  4. Diltiazem ER 180 mg daily.  5. Metformin 500 mg daily.  6. Metoprolol XL 100 mg daily.  7. Simvastatin 40 mg daily.  8. Albuterol, Atrovent nebulizers as needed.  9. Combivent as needed.  10,  Aspirin 325 mg daily.  1. Avelox  400 mg daily.  2. Advair 250/50 twice daily.   SOCIAL HISTORY:  The patient smokes a pack of cigarettes a day.  She  drinks about three drinks daily.  She is an Airline pilot.  She is married.   FAMILY HISTORY:  Reviewed and as per previous.   REVIEW OF SYSTEMS:  As above, otherwise negative.   PHYSICAL EXAMINATION:  Oxygen saturation 91% on room air, respiratory  rate 18, pulse 75, blood pressure 152/92.  IN GENERAL:  The patient appears in mild to moderate respiratory  distress with frequent wet cough.  HEENT:  Normocephalic, atraumatic.  Pupils equal, round, reactive to  light.  Sclera nonicteric.  Moist mucous membranes.  Oropharynx without  erythema or exudate.  NECK:  Is supple.  No JVD, adenopathy, thyromegaly.  LUNGS:  She has bilateral rhonchi, expiratory wheeze, prolonged  expiratory phase.  CARDIOVASCULAR:  Regular rate and rhythm without murmurs, gallops or  rubs.  ABDOMEN:  Obese, soft, nontender.  GU/RECTAL:  Deferred.  EXTREMITIES:  Trace edema.  NEUROLOGIC:  Alert and oriented.  Cranial nerves and sensorimotor exam  are intact.  PSYCHIATRIC:  The patient is cooperative.  Normal affect.   LABORATORIES:  Her white blood cell count is 10,800 with a normal  differential.  The rest of her CBC is unremarkable.  Complete metabolic  panel significant for an SGOT of 48, alkaline phosphatase 135, otherwise  unremarkable.  Cardiac enzymes negative.  Urinalysis trace blood, 100  proteins, 3-6 red cells, otherwise negative.  EKG shows normal sinus  rhythm with flipped T-waves inferolaterally unchanged from previous,  prolonged QT.  Chest x-ray today shows pneumonia left lower lobe, mild  bronchitic changes, stable mild cardiomegaly.   ASSESSMENT/PLAN:  1. Pneumonia, worsening on outpatient therapy, now with hypoxia:  The      patient will be admitted.  She will get intravenous vancomycin and      cefepime.  Supportive care, nebulizers.  She refuses steroids at      this time  due to swelling and weight gain.  She will get      supplemental oxygen.  Check sputum culture.  2. Chronic obstructive pulmonary disease exacerbation.  See above.  3. Possible unstable angina:  Per Dr. Swazil[place].  Certainly this could be      worsened by the patient's pulmonary issues.  4. Continued tobacco abuse, counseled against.  5. Type 2 diabetes.  Continue outpatient medications.  6. Anxiety give p.r.n. Ativan.  7. Chronic pain.  Continue outpatient regimen.  8. Dyslipidemia.  9. History of right-sided mastectomy.  10.History of lobectomy.  11.History of duodenal ulcer.  12.Hypertension.  Continue outpatient medication.      Corinna L. Lendell Caprice, MD  Electronically Signed     CLS/MEDQ  D:  10/22/2008  T:  10/22/2008  Job:  [id number]   cc:   Pam Drown, M.D.  Fax: [phone]

## 2011-01-28 NOTE — H&P (Signed)
[name], [name]                ACCOUNT NO.:  [id number]   MEDICAL RECORD NO.:  [id number]          PATIENT TYPE:  INP   LOCATION:  3728                         FACILITY:  [facility]   PHYSICIAN:  Vesta Mixer, M.D. DATE OF BIRTH:  21-Nov-1945   DATE OF ADMISSION:  04/04/2009  DATE OF DISCHARGE:                              HISTORY & PHYSICAL   [name] is a 65 year old female with a history of coronary artery  disease, COPD, hyperlipidemia, diabetes mellitus, and chronic back pain.  She presents to the emergency room with 5-day history of right arm pain.   [name] has a history of coronary artery disease.  She had a non-Q-  wave myocardial infarction in 2000.  She presented with bilateral arm  pain at that time.  She had successful PTCA and stenting of her right  coronary artery.  She has overall done fairly well but has had  occasional hospitalizations for chest pain.  She had a similar  hospitalization for chest pain several months ago.  She was supposed to  come to the office for a stress nuclear study, but she failed to call to  make the appointment.   She developed bilateral arm pain last Friday (5 days ago).  The pain  eventually settled into her right arm.  It has been fairly constant.  It  is not worsened with walking up stairs or carrying anything.  It  typically is worsened when she twist her torso and pulls her arm back.  There is some associated diaphoresis and some associated heartburn.  She  denies any syncope.  She has chronic shortness breath, but the shortness  breath is no worse.  The pain has been relatively constant.  It has not  been helped by nitroglycerin.  She has tried several nitroglycerin but  without any significant help.  She has gotten a little bit relief from  morphine here in the emergency room.  The pain is described as a fairly  sharp pain.   CURRENT MEDICATIONS:  1. Zocor 40 mg a day.  2. Lopressor 100 mg a day.  3. Aspirin 81 mg a  day.  4. Nitroglycerin 0.4 mg as needed.  5. Cardizem CD 180 mg a day.  6. Neurontin 600 mg a day.  7. OxyContin 40 mg a day.  8. Insulin (Lantus) 22 units a day, presumably at night.  9. Alprazolam 0.25 mg as needed.  10.Metformin 500 mg twice a day.  11.Lasix 40 mg a day.  12.Potassium chloride 10 mEq a day.   ALLERGIES:  PENICILLIN.   PAST MEDICAL HISTORY:  1. Coronary artery disease.  She is status post PTCA and stenting of      right coronary artery using a 3.0 x 18-mm Tetra stent.  It was      expanded up to 12 atmospheres with very good result.  2. Hyperlipidemia.  3. Diabetes mellitus.  4. COPD.  5. Hypertension.  6. Chronic back pain.  She sees Dr. Ethelene Hal for pain control.   SOCIAL HISTORY:  The patient smokes a pack of cigarettes a day.  She  drinks about 3 alcoholic drinks a night.  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   FAMILY HISTORY:  Positive for cardiac disease.   REVIEW OF SYSTEMS:  Reviewed in the HPI.  All systems were reviewed.  Specifically, she denied any heat or cold intolerance.  She has had a  slow weight gain.  She denies any change of her bowel habits.  She  denies any blood in her urine or blood in her stool.  She denies any  rash or skin nodules.  She denies any problems with her eyes, ears,  nose, and throat.  She denies any problems with her breast or skin.  She  has some chronic shortness of breath.  She denies any dizziness.  She  denies any depression.  She denies any thrush.  She denies any  confusion.  All other systems were reviewed and are negative.   PHYSICAL EXAMINATION:  GENERAL:  She is a middle-aged female in no acute  distress.  She is alert and oriented x3.  Her mood and affect are  normal.  HEENT:  Her heart rate is 76 and blood pressure is 150/80.  HEENT:  Her sclerae are nonicteric.  Her mucous membranes are moist.  NECK:  Supple.  Her carotids are 2+ without bruits.  There is no JVD.  BACK:  Nontender.  EXTREMITIES:   Right shoulder is not particularly tender.  LUNGS:  Clear.  HEART:  Regular rate, S1 and S2.  She has no murmurs, gallops, or rubs.  Her PMI is nondisplaced.  CHEST WALL:  Nontender.  ABDOMEN:  Good bowel sounds.  There is no hepatosplenomegaly.  She has  no guarding or rebound.  There are no bruits.  EXTREMITIES:  No clubbing, cyanosis, or edema.  There is no rash or skin  nodules.  She has good pulses in her legs and arms.  There are no  palpable cords.  NEURO:  Cranial nerves II through XII are intact and motor and sensory  function are intact.  Gait was not assessed.  GYN:  Not performed.   Her EKG reveals normal sinus rhythm.  She has mild T-wave inversions in  the anterolateral leads which is unchanged from her previous EKG earlier  this year.   LABORATORY DATA:  Her CPK-MB is less than 0.01, her troponin is less  than 0.05, and her myoglobin is 52.9.  Her white blood cell count 8.8  and her hemoglobin is 15.8.  Her sodium is 139, potassium is 3.8,  chloride is 104, CO2 is 25, creatinine 0.53, and BUN 6.   Her chest x-ray reveals normal cardiac silhouette.  Her lungs are clear.   [name] presents with right arm pain for the past 5 days.  I have  discussed the case with Dr. Swaz[place] who will prefer that we proceed with  heart catheterization given her history.  In addition, she had a similar  presentation several months ago, but she failed to call to follow up  with her stress test.  We will go and schedule her for heart  catheterization tomorrow.   If the heart catheterization turns up to be unremarkable, then I think  she needs to further evaluation of her arm and shoulder pain from an  orthopedic standpoint.  She will likely need an MRI or similar study to  evaluate her musculoskeletal system.  We will give her morphine and  Percocet for pain.  We will continue with a low-dose  nitroglycerin.  Her enzymes are negative and so thus far I do not think  that there is any  indication to take her urgently to the lab.  Her EKG  certainly is unchanged from previous tracings.  All of her other medical  problems remain fairly stable.      Vesta Mixer, M.D.  Electronically Signed     PJN/MEDQ  D:  04/04/2009  T:  04/05/2009  Job:  [id number]   cc:   Pam Drown, M.D.  Peter M. Swaziland, M.D.

## 2011-01-28 NOTE — Discharge Summary (Signed)
[name], [name]                ACCOUNT NO.:  [id number]   MEDICAL RECORD NO.:  [id number]          PATIENT TYPE:  INP   LOCATION:  1425                         FACILITY:  [facility]   PHYSICIAN:  [name], MDDATE OF BIRTH:  1945/09/22   DATE OF ADMISSION:  10/20/2008  DATE OF DISCHARGE:  10/22/2008                               DISCHARGE SUMMARY   DISCHARGE DIAGNOSES:  1. Left lower lobe pneumonia.  2. Chronic obstructive pulmonary disease exacerbation.  3. Coronary artery disease with stent and multiple interventions.  4. Tobacco abuse, counseled against.  5. Diabetes type 2.  6. Hypertension.  7. Anxiety.  8. Chronic pain.  9. History of right mastectomy secondary to cancer.   DISCHARGE MEDICATIONS:  Stop Avelox. Start doxycycline 100 mg p.o.  b.i.d. until gone.  Suprax 400 mg daily until gone. Tessalon Perles 200  mg every 8 hours as needed for cough. Continue Lantus 20 units  subcutaneously nightly. OxyContin 40 mg four times a day. Gabapentin 600  mg four times a day. Diltiazem ER 180 mg a day, metformin 500 mg a day,  metoprolol XL 100 mg a day.  Simvastatin 40 mg a day. Atrovent,  albuterol nebulizers or inhaler as needed. Aspirin 325 mg a day, Advair  250/50 every 12 hours.   ACTIVITY:  Ad lib.   CONDITION:  Stable.   CONSULTATIONS:  [name].   PROCEDURES:  None.   DIET:  Should be diabetic cardiac.   LABS:  Initial white count was 10,800. Otherwise unremarkable CBC.  Complete metabolic panel significant for a alkaline phosphatase of 135,  SGOT of 48.  Serial cardiac enzymes negative.  Urinalysis showed trace  blood, negative protein, 3-6 white cells.   SPECIAL STUDIES/RADIOLOGY:  Chest x-ray showed left lower lobe  pneumonia, chronic bronchitic changes.  EKG showed normal sinus rhythm  with flipped T-waves inferolaterally, unchanged from previous.   HISTORY AND HOSPITAL COURSE:  [name] is a 65 year old white female  with multiple medical  problems who was sent to the emergency room by Dr.  Corliss [name] who had evaluated the patient in her office.  [name] had  been on Avelox for 5 days, being treated for outpatient pneumonia.  Her  COPD had worsened. She refused steroids.  She was hypoxic in the office  and was having bilateral arm pain which was consistent with anginal  equivalent and felt similar to her previous episodes.  In the emergency  room she had oxygen saturations in the 90's on 2 liters nasal cannula.  She had been seen by [name]. She ruled out for MI. She was started  on vancomycin and cefepime as well as supportive care.  She was also  given nebulizers.  Her diabetes was monitored.  She had no further arm  pain.  At the time of discharge her wheezing is much improved.  She is  no longer hypoxic.  She feels better.  She still has a cough but it is  improved.  She wishes to stop smoking and plans on doing so with  patches. She requests to go back  to work tomorrow which I advised  against.  I have written her a note to release her.   Follow up with [name] or [name], nurse practitioner, in  4 weeks.  She will need a repeat chest x-ray in 4-6 weeks to ensure  resolution.   Total time on the day of discharge is 40 minutes.      Rachel L. Lendell Caprice, MD  Electronically Signed     CLS/MEDQ  D:  10/22/2008  T:  10/22/2008  Job:  [id number]   cc:   [name], M.D.  Fax: [phone]   [name], M.D.  Fax: [phone]

## 2011-01-28 NOTE — Cardiovascular Report (Signed)
[name], [name]                ACCOUNT NO.:  [id number]   MEDICAL RECORD NO.:  [id number]          PATIENT TYPE:  INP   LOCATION:  3728                         FACILITY:  [facility]   PHYSICIAN:  Peter M. Swaziland, M.D.  DATE OF BIRTH:  September 05, 1946   DATE OF PROCEDURE:  04/05/2009  DATE OF DISCHARGE:  04/05/2009                            CARDIAC CATHETERIZATION   INDICATIONS FOR PROCEDURE:  A 65 year old white female with history of  coronary artery disease status post multiple angioplasty procedures of  obtuse marginal branch in 1999.  She has had prior stenting of the right  coronary artery in 2000.  She presents with refractory right shoulder  pain.  She has continued to smoke.   PROCEDURE:  Left heart catheterization, coronary and left ventricular  angiography.   ACCESS:  Via the right femoral artery using standard Seldinger  technique.   EQUIPMENT:  A 6-French 4-cm right and left Judkins catheter, 6-French  pigtail catheter, and 6-French arterial sheath.   MEDICATIONS:  Local anesthesia 1% Xylocaine, Versed 2 mg IV, fentanyl 25  mcg IV.   CONTRAST:  100 mL of Omnipaque.   HEMODYNAMIC DATA:  Aortic pressure is 167/80 with a mean of 114.  Left  ventricular pressure is 165 with an EDP of 14 mmHg.   ANGIOGRAPHIC DATA:  The left coronary artery arises and distributes  normally.  The left main coronary artery is normal.   The left anterior descending artery has minimal irregularities less than  10%.  The first diagonal is also normal.   The left circumflex coronary artery gives rise to a single bifurcating  obtuse marginal branch.  This vessel is widely patent at the prior  angioplasty site with less than 10% irregularity.   The right coronary artery arises and distributes normally.  It is a  dominant vessel.  It has 30% narrowing in the proximal vessel and in the  distal vessel.  There is a stent in the crux which has minor  irregularities less than 20%.   Left  ventricular angiography was performed in the RAO view.  This  demonstrates normal left ventricular size and contractility with normal  systolic function.  Ejection fraction is estimated at 60%.   FINAL INTERPRETATION:  1. Nonobstructive atherosclerotic coronary artery disease.  Continued      excellent patency of prior intervention      sites.  2. Normal left ventricular function.   PLAN:  We would recommend orthopedic evaluation for her ongoing neck and  shoulder pain.           ______________________________  Peter M. Swaziland, M.D.     PMJ/MEDQ  D:  04/05/2009  T:  04/06/2009  Job:  [id number]   cc:   Clovis Riley, PA  Richard D. Ethelene Hal, M.D.

## 2011-01-28 NOTE — Consult Note (Signed)
NAMESTACI, [name] NO.:  [id number]   MEDICAL RECORD NO.:  [id number]          PATIENT TYPE:  INP   LOCATION:  0106                         FACILITY:  [facility]   PHYSICIAN:  Peter M. Swaziland, M.D.  DATE OF BIRTH:  [birth year]   DATE OF CONSULTATION:  10/20/2008  DATE OF DISCHARGE:                                 CONSULTATION   HISTORY OF PRESENT ILLNESS:  Ms. Clendenning is a pleasant 65 year old white  female who is well-known to me.  She is seen at the request of Dr.  Corliss Blacker for evaluation of arm pain consistent with angina pectoris.  The  patient states she became acutely ill this past Thursday with marked  sinus congestion and drainage along with a cough.  She was seen Monday  and was diagnosed with bilateral pneumonia.  She refused hospital  treatment and was given Avelox p.o.  She had been using home nebulizer  treatment as well.  Today, she was seen back in Dr. Darrell Jewel office and  still had a very productive cough, she was hypoxic with a sat of 87% on  room air and it was recommended she be admitted for inpatient therapy.  From a cardiac standpoint, the patient has had recent complaints of arm  pain.  Approximately 10 days ago she had a very stressful situation with  her family and developed acute severe bilateral arm pain; since then she  has had intermittent left arm pain that appears more constant and dull,  she has had no significant chest pain, no nausea, vomiting.  She is  currently pain-free.   PAST MEDICAL HISTORY:  1. She has a history of coronary artery disease and has had multiple      coronary interventions in the past.  She had angioplasty of a      bifurcation OM-1, OM-2 lesion in December 1999 and February 2000.      She had angioplasty of the mid first obtuse marginal vessel in May      2000 and stenting at the crux of the right coronary artery in      November 2000 with a 3.8-mm x 18-mm Tetra stent.  Her last      adenosine Cardiolite study  in May 2006 was normal.  2. COPD.  3. Chronic tobacco abuse.  4. Diabetes mellitus type 2.  5. Dyslipidemia.  6. Anxiety.  7. Status post right mastectomy for cancer in 1993.  8. History of lobectomy.  9. Remote duodenal ulcer.  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   MEDICATIONS:  1. Lantus 20 units subcu daily.  2. OxyContin 40 mg q.i.d.  3. Gabapentin 600 mg q.i.d.  4. Diltiazem ER 180 mg per day.  5. Metformin 500 mg b.i.d.  6. Metoprolol XL 100 mg per day.  7. Simvastatin 40 mg per day.  8. Atrovent and Proventil nebulizer therapy four times daily.  9. Aspirin 325 mg per day.  10.Avelox 400 mg per day.  11.Advair 250/50 q.12 h.   She is allergic to PENICILLIN.   SOCIAL HISTORY:  She is married.  She works as  an Airline pilot.  She has 3  children.  She has been a smoker of 1 pack per day.  She has a history  of moderate alcohol abuse.   FAMILY HISTORY:  Father died at age 98 with myocardial infarction and  stroke.  Mother died of old age.   REVIEW OF SYSTEMS:  The patient denies any recent increase in edema or  orthopnea.  She does have a history of significant edema on prednisone  in the past.  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  She denies any orthopnea or PND.  Her cough has  been productive.  She denies any significant chest pain.  No change in  bowel or bladder habits.  All other systems are reviewed and are  negative.   PHYSICAL EXAMINATION:  The patient is a well-developed white female in  mild respiratory distress.  Blood pressure is 152/92, pulse is 75 and  regular, respirations are 18, sats are 91% on 2 liters nasal cannula.  HEENT:  She is normocephalic, atraumatic.  She wears glasses.  Her  pupils are equal, round and reactive.  Sclerae are clear.  Oropharynx is  clear.  NECK:  Supple without JVD, adenopathy, thyromegaly or bruits.  LUNGS:  Diffuse bilateral coarse rhonchi.  CARDIAC:  A regular rate  and rhythm without murmur, rub, gallop or  click.  ABDOMEN:  Soft, nontender.  EXTREMITIES:  Trace edema.  Pedal pulses are palpable.  NEUROLOGIC:  She is alert and oriented x4.  Cranial nerves II-XII are  intact.  She has no focal findings.   LABORATORY DATA:  Echocardiogram shows normal sinus rhythm with  nonspecific ST abnormality.  Chest x-ray shows left lower lobe  pneumonia.  Urinalysis is negative.  White count 10,800, hemoglobin  14.6, hematocrit 42.5, platelets 248,000.   IMPRESSION:  1. Recent arm pain, consistent with angina pectoris.  Probably      exacerbated by pneumonia and hypoxemia.  2. Left lower lobe pneumonia.  3. Tobacco abuse.  4. Chronic obstructive pulmonary disease.  5. History of alcohol abuse.  6. Diabetes mellitus type 2.  7. Dyslipidemia.   PLAN:  Will obtain serial cardiac enzymes and echocardiogram.  Will need  inpatient therapy to treat her pneumonia and hypoxemia including IV  antibiotics, nebulizer therapy, etc.  Would hold steroids at this point  given her reaction  in the past.  If she rules out for myocardial infarction, I would  postpone further cardiac workup until her pneumonia and hypoxemia have  resolved, but I feel she will require cardiac evaluation in the near  future once her condition is stabilized.           ______________________________  Peter M. Swaziland, M.D.     PMJ/MEDQ  D:  10/20/2008  T:  10/20/2008  Job:  [id number]   cc:   Pam Drown, M.D.  Fax: [phone]   Corinna L. Lendell Caprice, MD

## 2011-01-28 NOTE — Telephone Encounter (Signed)
NOW NEEDS A SCRIPT FOR WHAT EVER YOU WANT HER TO TAKE IN PLACE LIPATOR. [facility] [phone]

## 2011-01-31 NOTE — Discharge Summary (Signed)
NAME:  [name], [name] NO.:  [id number]   MEDICAL RECORD NO.:  [id number]                   PATIENT TYPE:  INP   LOCATION:  2003                                 FACILITY:  [facility]   PHYSICIAN:  Peter M. Swaziland, M.D.               DATE OF BIRTH:  [birth year]   DATE OF ADMISSION:  08/18/2002  DATE OF DISCHARGE:  08/19/2002                                 DISCHARGE SUMMARY   HISTORY OF PRESENT ILLNESS:  The patient is a 65 year old white female with  a history of coronary disease who presents with acute upper respiratory  infection associated with chest pain.  She is admitted to rule out  myocardial infarction.   For details of her Past Medical History, Social History, Family History, and  Physical Examination, please see admission History and Physical.   LABORATORY DATA:  ECG showed normal sinus rhythm.  There was slight ST  segment depression inferolaterally.   Chest x-ray showed no active disease with no acute infiltrates.   White count was 7900, hemoglobin 14.7, hematocrit 43.1, platelets 232,000.  Coags were normal.  Sodium 136, potassium 4.1, chloride 100, CO2 26, BUN 9,  creatinine 0.7, glucose 103, total bilirubin 1.4, SGOT 44, SGPT 41.  All  other chemistries were normal.  CPK was 167 with 1.4 MB, troponin 0.01.  Urinalysis was normal.   HOSPITAL COURSE:  The patient was admitted to telemetry.  She was given a  dose of IV Rocephin 1 g IV.  She was started on Zithromax.  She subsequently  ruled out for myocardial infarction by serial cardiac enzymes.  She had no  further chest pain.  She remained afebrile and hemodynamically stable.  It  was felt that she was stable for discharge the following morning with  continued outpatient antibiotic therapy.   DISCHARGE DIAGNOSES:  1. Acute bronchitis.  2. Chest pain secondary to #1.  3. Atherosclerotic coronary artery disease status post multiple prior     coronary interventions.  4.  Hyperlipidemia.  5. Tobacco abuse.   DISCHARGE MEDICATIONS:  The patient will continue with her prior medications  including:  1. Aspirin 325 mg daily.  2. Lipitor 10 mg per day.  3. Lopressor 50 mg daily.  4. Ranitidine 150 mg daily.  5. Cardizem CD 180 mg daily.  6. Wellbutrin 150 mg daily.  7. Xanax p.r.n.  8. Nitroglycerin p.r.n.  9. Neurontin 300 mg t.i.d.  10.      Zithromax 250 mg daily for the next 4 days.  11.      Humibid LA for cough.   FOLLOW UP:  The patient already has a scheduled appointment with Dr. Swaziland  in one week and will keep that appointment.   DISCHARGE STATUS:  Improved.  Peter M. Swaziland, M.D.    PMJ/MEDQ  D:  08/19/2002  T:  08/19/2002  Job:  [id number]   cc:   Thelma Barge P. Modesto Charon, M.D.  [address]  [place]  [place] [address]  Fax: [phone]

## 2011-01-31 NOTE — H&P (Signed)
[name], [name]                ACCOUNT NO.:  [id number]   MEDICAL RECORD NO.:  [id number]          PATIENT TYPE:  EMS   LOCATION:  ED                           FACILITY:  [facility]   PHYSICIAN:  Deirdre Peer. Polite, M.D. DATE OF BIRTH:  November 19, 1945   DATE OF ADMISSION:  06/13/2004  DATE OF DISCHARGE:                                HISTORY & PHYSICAL   CHIEF COMPLAINT:  Shortness of breath.   HISTORY OF PRESENT ILLNESS:  [name] is a 65 year old female with  extensive past medical history for COPD, coronary artery disease, and  chronic pain who presents to the ED for evaluation of productive cough for  greater than one week and shortness of breath.  The patient had symptoms  greater than one week, as stated above, of shortness of breath and  productive cough, thick sputum, which she could not characterize any better  because she did not examine it.  She did admit to some low-grade temps and  occasional chills and sweats.  Patient has had energy decline since then,  decreased po intake, malaise, and some nondescript chest pain which she took  nitroglycerin for.  Patient saw her primary MD in the walk-in clinic on  Saturday and was recommended to be admitted to the hospital; however, the  patient refused.  The patient was given outpatient antibiotics; however, she  did not improve.  Patient was seen by her primary MD today, was found to  still be short of breath with productive cough and wheeze on exam with  hypoxia, 86% on room air.  It was recommended for the patient to be admitted  to the hospital for further evaluation and treatment.  At the time of my  evaluation, the patient has been seen in [facility] ED with mild-to-  moderate respiratory difficulty and audible expiratory wheeze.  Patient  states that the symptoms have been going on for a week, as stated above, and  just not getting better.  Patient did mention some nondescript complaint of  chest discomfort at rest as well as  exertion.  Because of this discomfort,  she is also taking some sublingual nitroglycerin with some improvement.  The  patient states that she has been compliant with all of her medications.  Does admit to some sick contacts at work.  Admission is deemed necessary for  further evaluation and treatment.   PAST MEDICAL HISTORY:  Significant for COPD, tobacco abuse, coronary artery  disease, status post MI in 1998.  Her cardiologist is Dr. Peter Swaziland.  Patient states that she has had multiple angioplasties, approximately five,  and a stent x1.  Also, past medical history includes hypertension, high  cholesterol, borderline diabetes, tobacco use, breast CA, status post right  mastectomy at age 15.  Denies any chemo or radiation treatments.  Patient  also admits to partial lobectomy in her 30s.  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  Patient also has intermittent  chronic diarrhea, which she states is related to stress.   MEDICATIONS:  1.  OxyContin 20 mg  t.i.d.  2.  Oxycodone 15 mg q.4h. p.r.n.  3.  Xanax p.r.n.  4.  Biaxin, which is a new medicine since last Saturday.  5.  Lipitor 10 mg q.d.  6.  Neurontin 300 mg t.i.d.  7.  Cardia 180 mg q.d.  8.  Zantac 150 mg q.d.  9.  Xanax 0.25 p.r.n.  10. Aspirin 325 mg q.d.  11. Lopressor 50 mg q.d.  12. Sublingual nitroglycerin p.r.n.   SOCIAL HISTORY:  Positive for tobacco one pack per day since age 11.  Positive alcohol, two drinks per night, bourbon and Coke.  Denies any drugs.   PAST SURGICAL HISTORY:  As stated in the past medical history.  In addition,  it includes three C-sections, tonsillectomy at age 37.   ALLERGIES:  The patient describes an allergy to penicillin, which causes a  rash.   REVIEW OF SYSTEMS:  Positive for diarrhea, which she thinks is stress-  related.  Fever x3 days.  Positive productive cough x1 week.  Occasional  sweats.  Please see HPI for further review of systems.    FAMILY HISTORY:  Mother with diabetes.  Father deceased with MI and CVA.  Patient is without brothers or sisters.   PHYSICAL EXAMINATION:  VITAL SIGNS:  Temp 99.5, BP 155/85, pulse 104,  respiratory rate 18, satting 89%.  GENERAL:  Patient is in mild-to-moderate respiratory distress with audible  expiratory wheeze with sats fluctuating from 88-89% on 2 liters.  HEENT:  Anicteric sclerae.  Dry oral mucosa.  NECK:  No nodes.  Positive JVD to the angle of the jaw at 30 degrees.  LUNGS:  Clear air movement bilaterally with expiratory wheeze and bilateral  rhonchi.  HEART:  Regular S1 and S2.  No S3 appreciated.  ABDOMEN:  Soft and nontender.  No hepatosplenomegaly.  EXTREMITIES:  No clubbing, cyanosis or edema.  RECTAL:  Deferred.  NEUROLOGIC:  Nonfocal.   DATA:  Chest x-ray shows cardiomegaly with no edema.   CBC:  White count 10.8, hemoglobin 13.6, platelets 278, neutrophil count  80%.  BMET essentially within normal limits.  Potassium 3.6, creatinine 0.7.  UA shows rare bacteria.  Leukocyte esterase negative.  Nitrite negative.  Specific gravity 1.007.  CK, troponin I, and BNP are pending at the time of  this dictation.   EKG:  Normal sinus rhythm with diffuse T wave changes.   ABG pending at the time of this dictation.   ASSESSMENT:  1.  Chronic obstructive pulmonary disease exacerbation.  2.  Hypoxia. The differential diagnosis includes edema versus      hypoventilation secondary to narcotics versus chronic obstructive      pulmonary disease exacerbation, most likely a combination of all.  3.  Chest pain, nonspecific with occasional nitroglycerin use.  4.  History of coronary artery disease, status post myocardial infarction in      the past.  Status post angioplasty x5.  Status post stent x1.  5.  Hypertension.  6.  High cholesterol.  7.  Borderline diabetes.  8.  Breast carcinoma, status post right mastectomy.  Patient denies any     radiation therapy or chemotherapy.  Had  a mastectomy at age 28.  19.  Tobacco use.  10. Status post lobectomy for cancer.  11. Chronic back pain.  12. Chronic diarrhea.  13. Daily alcohol use.   Recommend patient be admitted to the stepdown unit for evaluation and  treatment of COPD exacerbation.  Patient will be treated with antibiotics,  O2,  nebs, steroids, and will obtain an ABG and follow with a chest x-ray.  As the patient had nonspecific chest pain, will order serial cardiac  enzymes.  Most likely her symptoms are on the basis of pain related to  excessive coughing from COPD, +/- the hypoxia aggravating her underlying  angina.  Patient currently is on Lopressor; however, since she has an  expiratory wheeze, will hold her beta blocker at this time.  Patient also  has daily alcohol consumption; therefore, will provide DVT prophylaxis.  Will make further recommendations at the review of the above studies.     Joen Laura   RDP/MEDQ  D:  06/13/2004  T:  06/13/2004  Job:  [id number]   cc:   Maryla Morrow. Modesto Charon, M.D.  [address]  [place]  [place] [address]  Fax: [phone]

## 2011-01-31 NOTE — H&P (Signed)
NAME:  [name], [name] NO.:  [id number]   MEDICAL RECORD NO.:  [id number]                   PATIENT TYPE:  INP   LOCATION:  2003                                 FACILITY:  [facility]   PHYSICIAN:  Peter M. Swaziland, M.D.               DATE OF BIRTH:  03-06-1946   DATE OF ADMISSION:  08/18/2002  DATE OF DISCHARGE:                                HISTORY & PHYSICAL   HISTORY OF PRESENT ILLNESS:  The patient is a 65 year old white female with  a history of coronary artery disease, chronic tobacco abuse who presents  with an upper respiratory infection and chest pain. The patient has had  infrequent symptoms of angina over the past six months. These are described  as an aching in her arms and relieved with nitroglycerin. Since this Monday,  she has developed an upper respiratory infection with severe cough,  productive of thick sputum, sinus congestion, headache and chills. Yesterday  evening she  developed severe substernal chest pain described as mid  substernal pain with heaviness. She took nitroglycerin with some relief, but  her pain persisted for four to five hours. Today she has had some very  minimal chest pain but still has a severe cough. She is also upset that a  coworker died unexpectedly at their office this morning.   PAST MEDICAL HISTORY:  1. Atherosclerotic coronary artery disease, status post multiple coronary     interventions. She is status post angioplasty of the left circumflex     coronary artery x 3. She also had a stent to the right coronary artery     last in November 2000.  2. History of breast cancer, status post right mastectomy.  3. Status post lobectomy.  4. Hyperlipidemia.  5. Remote duodenal ulcer.  6. Status post tonsillectomy and adenoidectomy.   ALLERGIES:  PENICILLIN.   MEDICATIONS:  1. Lipitor 10 mg q.d.  2. Lopressor 50 mg q.d.  3. Aspirin q.d.  4. Wellbutrin 150 mg q.d.  5. Ranitidine 150 mg q.d.  6. Cardizem CD  180 mg q.d.  7. Neurontin 300 mg t.i.d.   SOCIAL HISTORY:  The patient is married. She has children. She smokes one  and a half packs per day. 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   FAMILY HISTORY:  Her father died at age 7 of myocardial infarction and  stroke. Her mother had a history of diabetes. She has no siblings.   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 Other review of systems is negative.   PHYSICAL EXAMINATION:  GENERAL:  The patient is an acutely ill appearing  white female.  VITAL SIGNS:  Blood pressure 168/78, pulse 102, respirations 20, temperature  97.1.  HEENT:  Reveals clear sinus drainage. Oropharynx is clear. She has no  jugular venous  distention or  bruits.  LUNGS:  Reveal coarse rhonchi on the left.  CARDIAC:  Reveals regular rate and rhythm without murmurs, rubs, gallops.  ABDOMEN:  Soft, nontender without masses or bruits.  EXTREMITIES:  Without edema or phlebitis.  NEUROLOGIC:  Intact.   LABORATORY DATA:  An electrocardiogram shows normal sinus rhythm with mild  ST depression inferolaterally.   IMPRESSION:  1. Upper respiratory infection, rule out pneumonia.  2. Chest pain, rule out myocardial infarction, probably precipitated by     upper respiratory infection.  3. Atherosclerotic coronary artery disease, status post multiple coronary     interventions.  4. Hyperlipidemia.  5. History of breast cancer.   PLAN:  Obtain routine labs and a chest x-ray. The patient will be admitted  to telemetry. Will obtain serial cardiac enzymes. The patient will be begun  on antibiotic therapy for a URI.                                                Peter M. Swaziland, M.D.    PMJ/MEDQ  D:  08/18/2002  T:  08/18/2002  Job:  [id number]   cc:   Thelma Barge P. Modesto Charon, M.D.  [address]  [place]  [place] [address]  Fax: [phone]

## 2011-01-31 NOTE — H&P (Signed)
[place]  Patient:    [name]                        MRN: [id number] Adm. Date:  [id number] Attending:  [place], Peter Manning CC:         Redmond Baseman, M.D.                         History and Physical  CHIEF COMPLAINT:  Arm pain.  HISTORY OF PRESENT ILLNESS:  [name] is a 65 year old white female with a history of tobacco abuse, hypercholesterolemia, and a family history of coronary disease.  She was admitted in 12/99 with a non-Q-wave myocardial infarction.  A  subsequent cardiac catheterization demonstrated two-vessel obstructive coronary  artery disease.  There was a complex high-grade stenosis in the left circumflex  with bifurcation in the first marginal vessel.  The right coronary artery was also a small-caliber vessel and had 50-60% stenosis.  We proceeded with complex angioplasty of the left circumflex marginal bifurcation with a good result. The patient has been angina-free until last night when she developed bilateral arm pain.  This was relieved incompletely with nitroglycerin and she has taken multiple nitroglycerins since last night without complete relief.  She denies any chest ain or shortness of breath.  She denies any diaphoresis.  Her ECG today demonstrates inferolateral ST depression consistent with ischemia and she is admitted for unstable angina pectoris.  PAST SURGICAL HISTORY:  She has had a previous right mastectomy for breast cancer in 1993.  Previous vulvectomy.  Status post T&A.  PAST MEDICAL HISTORY:  She has a history of hypercholesterolemia and asthma. She has a history of rosacea.  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  She has a remote history of duodenal ulcer.  She had gestational diabetes with one pregnancy and toxemia with another.  CURRENT MEDICATIONS: 1. Tetracycline 500 mg daily. 2. Lipitor 10 mg daily. 3. Lopressor 50 mg b.i.d. 4. Aspirin daily. 5. Nitroglycerin  p.r.n. 6. Wellbutrin 150 mg twice a day.  ALLERGIES:  She is allergic to penicillin.  SOCIAL HISTORY:  The patient is married.  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  She has three children.  She continues to smoke, but is down to one-half  pack per day.  She denies alcohol use currently, but has a remote history of alcohol abuse.  FAMILY HISTORY:  Her father died at age 87 with myocardial infarction and stroke. her mother is age 46 and has diabetes.  She has no siblings.  REVIEW OF SYSTEMS:  Otherwise as per HPI.  PHYSICAL EXAMINATION:  The patient is a well-developed white female in mild distress.  WEIGHT:  193 pounds.  VITAL SIGNS:  The blood pressure is 130/84 and pulse 70 and regular.  HEENT:  Unremarkable.  She wears glasses.  Pupils equal, round, and reactive. he conjunctivae are clear.  The oropharynx is clear.  NECK:  Supple without JVD, adenopathy, thyromegaly, or bruits.  LUNGS:  Clear.  CARDIAC:  Exam reveals a regular rate and rhythm without murmurs, rubs, gallops, or clicks.  CHEST:  Old right mastectomy scar.  ABDOMEN:  Soft and nontender without hepatosplenomegaly, masses, or bruits.  EXTREMITIES:  Without edema.  Pulses are 2+ and symmetric.  There are no bruits.  NEUROLOGIC:  Exam is intact.  LABORATORY DATA:  The ECG shows a normal sinus rhythm with LVH.  There is ST depression inferolaterally consistent with ischemia.  IMPRESSION: 1. Unstable angina, probable restenosis, status post previous angioplasty of the    left circumflex obtuse marginal bifurcation. 2. Tobacco abuse. 3. Previous non-Q-wave myocardial infarction. 4. Hypercholesterolemia. 5. Family history of coronary artery disease.  PLAN:  The patient will be admitted and started on IV nitroglycerin and heparin. She will continue aspirin and Lopressor.  Will rule out for myocardial infarction. Repeat cardiac catheterization on Monday. DD:  10/19/98 TD:  10/19/98 Job:  [id number]

## 2011-01-31 NOTE — H&P (Signed)
[place]. [facility]  Patient:    [name], LONG                       MRN: [id number] Adm. Date:  [id number] Attending:  [place], Peter Manning CC:         Peter M. [place], M.D.                         History and Physical  REASON FOR ADMISSION:  Chest pain.  HISTORY:  This 65 year old female has a prior history of known coronary artery disease.  She has had previous subendocardial infarctions and had three angioplasties in the circumflex system in December 1999, February 2000 and May of 2000.  In November of 2000, she had recurrent chest discomfort and was found to have a patent circumflex site but a progression of stenosis in the right coronary artery which was stented with a 3.0 x 18.0-mm Tetra stent.  She reportedly had done well since then but reported that she continued to smoke and was not exercising.  On Friday, she developed recurrent chest discomfort described as sharp, but also described as pressure-type pain and took several nitroglycerin.  She was urged to seek care and actually called Dr. Mervyn Gay nurse but did not come in.  On Saturday, she had recurrent pain while working in the [employer].  One of her colleagues did an EKG and urged her to come in but she did not seek attention, had recurrent pain over the weekend requiring nitroglycerin.  She had recurrent chest discomfort this afternoon, late in the afternoon, and called after hours and was advised to come to the emergency room.  She describes it as pressure-type chest discomfort with radiation down the arm.  She is admitted for cardiac evaluation and to rule out unstable angina or MI.  PAST HISTORY:  Remarkable for hyperlipidemia, rosacea, history of duodenal ulcer in the remote past, history of gestational diabetes.  There is no history of hypertension.  PREVIOUS SURGERY:  Right mastectomy for breast cancer in 1993.  Previous lobectomy.  Previous  tonsillectomy.  ALLERGIES:  PENICILLIN.  CURRENT MEDICATIONS: 1. Lipitor 10 mg daily. 2. Lopressor 50 mg b.i.d. 3. Aspirin daily. 4. Wellbutrin 150 mg daily. 5. Cardizem CD 180 mg daily.  FAMILY HISTORY:  Her family history is recorded in old records; it is reviewed and is unchanged.  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  She is currently now smoking a pack of cigarettes per day and denies alcohol.  REVIEW OF SYSTEMS:  No significant situational stress particularly involved her work.  She has had right upper quadrant and flank pain recently and has had abdominal ultrasounds and has been told that she does not have gallstones. She has a remote history of GI bleeding.  She has known rosacea in the past. She has not had TIAs or claudication.  PHYSICAL EXAMINATION:  GENERAL:  She is an anxious female who is slightly obese.  VITAL SIGNS:  Her blood pressure is 130/80.  Her pulse was 70.  SKIN:  Warm and dry.  HEENT:  She wears glasses and is unremarkable.  NECK:  There is no thyromegaly or carotid bruits.  LUNGS:  Clear to A&P.  CARDIOVASCULAR:  Normal S1 and S2.  No S3 or murmur.  ABDOMEN:  Obese, soft and nontender.  There is no mass or  organomegaly noted.  EXTREMITIES:  Femoral pulses are 2+ bilaterally without bruit.  Posterior tibials are 2+.  There is no edema present.  LABORATORY AND X-RAY FINDINGS:  Twelve-lead ECG shows ST depression in the lateral leads which is worse since the last ECG in November of 2000.  Chest x-ray is unremarkable.  IMPRESSION: 1. Unstable angina pectoris in a patient with known coronary artery disease. 2. Multiple percutaneous interventions, with three interventions in the    circumflex and one in the right coronary artery in the past year and a    half. 3. Ongoing tobacco abuse. 4. Hyperlipidemia, under treatment. 5. Anxiety and depression. 6. Previous history of  breast cancer with mastectomy.  RECOMMENDATIONS:  Admit and begin IV heparin and IV nitroglycerin, begin Plavix.  Likely will require repeat catheterization and possible angioplasty by Dr. Swaziland and will keep n.p.o. after midnight for this. DD:  02/10/00 TD:  02/11/00 Job: [id number]

## 2011-01-31 NOTE — Discharge Summary (Signed)
NAMEHADLI, PLETCHER                ACCOUNT NO.:  [id number]   MEDICAL RECORD NO.:  [id number]          PATIENT TYPE:  INP   LOCATION:  3728                         FACILITY:  [facility]   PHYSICIAN:  Peter M. Swaziland, M.D.  DATE OF BIRTH:  1946/01/20   DATE OF ADMISSION:  04/04/2009  DATE OF DISCHARGE:  04/05/2009                               DISCHARGE SUMMARY   HISTORY OF PRESENT ILLNESS:  [name] is a 65 year old white female  with known history of coronary artery disease, hyperlipidemia, diabetes,  and COPD.  She was admitted with refractory right shoulder pain, was  somewhat similar to prior anginal symptoms.  She had some indigestion  symptoms.  She had no shortness of breath.  She did not get relief with  sublingual nitroglycerin.  She was admitted for further evaluation and  management.   For details of her past medical history, social history, family history,  and physical exam, please see admission history and physical.   LABORATORY DATA:  Her ECG showed normal sinus rhythm with T-wave  inversion in the anterior leads that was unchanged from prior ECGs.  Chest x-ray showed no active disease.  White count was 8800, hemoglobin  15.8, hematocrit 45.6, platelets 227,000.  Coags were normal.  Glucose  was 160.  Sodium 139, potassium 3.8, chloride 104, CO2 25, BUN of 6,  creatinine of 0.53.  Serial cardiac enzymes were negative x4.   HOSPITAL COURSE:  The patient was admitted to telemetry monitoring.  She  was treated with analgesics with IV morphine and then oral Percocet.  As  noted, she ruled out for myocardial infarction by serial cardiac  enzymes.  Given the uncertainty of her diagnosis, we proceeded with a  diagnostic cardiac catheterization on April 05, 2009.  This demonstrated  mild nonobstructive coronary disease.  Previous intervention sites were  still widely patent.  Ejection fraction was 60%.  Based on these  findings, it was felt that her symptoms were more  musculoskeletal and  probably related to cervical disk disease.  It was recommended that she  have a followup with Orthopedics as an outpatient.  She was continued on  her prior medical therapy.   DISCHARGE DIAGNOSES:  1. Refractory right shoulder pain, musculoskeletal.  2. Coronary artery disease status post multiple interventions in the      past, now with continued patency.  3. Insulin-dependent diabetes mellitus.  4. Hypertension.  5. Chronic back pain.  6. Hypercholesterolemia.  7. Tobacco abuse.   DISCHARGE MEDICATIONS:  1. Potassium 10 mEq daily.  2. Lasix 40 mEq daily.  3. Metoprolol 100 mg daily.  4. Simvastatin 40 mg daily.  5. Cartia XT 180 mg daily.  6. Gabapentin 600 mg q.i.d.  7. Metformin 500 mg 2 tablets daily.  This will be held for 48 hours      post cath.  8. Oxycodone ER 40 mg 4 times daily.  9. Alprazolam 0.25 mg q.8 h. as needed.  10.Lantus insulin 22 units subcu daily.   DISCHARGE STATUS:  Improved.           ______________________________  Peter M. [place]il[place], M.D.     PMJ/MEDQ  D:  04/26/2009  T:  04/26/2009  Job:  [id number]   cc:   Pam Drown, M.D.  Richard D. Ethelene Hal, M.D.

## 2011-01-31 NOTE — Op Note (Signed)
[facility]  Patient:    [name], [name] Visit Number: [id number] MRN: [id number]          Service Type: DSU Location: [facility] Attending Physician:  Wandalee Ferdinand Dictated by:   Rudy Jew Ashley Royalty, M.D. Proc. Date: 09/03/01 Admit Date:  09/03/2001 Discharge Date: 09/03/2001                             Operative Report  PREOPERATIVE DIAGNOSES:       1. Right adnexal mass.                               2. Intrauterine polyp versus fibroid.                               3. Abnormal uterine bleeding.  POSTOPERATIVE DIAGNOSES:      1. Right adnexal mass, pathology pending.                               2. Extensive intra-abdominal adhesions.                               3. Uterine polyp.                               4. Possible uterine fibroids, pathology pending.  PROCEDURES:                   1. Diagnostic/operative hysteroscopy.                               2. Dilatation and curettage.                               3. Lysis of adhesions.                               4. Oophorectomy.                               5. Subtotal salpingectomy.  SURGEON:                      Rudy Jew. Ashley Royalty, M.D.  ANESTHESIA:                   General.  ESTIMATED BLOOD LOSS:         50 cc.  COMPLICATIONS:                None.  PACKS AND DRAINS:             None.  DESCRIPTION OF PROCEDURE:     The patient was taken to the operating room and placed in the dorsal supine position.  After adequate general anesthesia was administered, she was placed in the lithotomy position, prepped and draped in the usual manner for abdominal and vaginal surgery.  A posterior weighted retractor was placed per vagina.  The anterior lip of  the cervix was grasped with a single-tooth tenaculum.  The uterus was gently sounded to approximately 8 cm.  The cervix was then serially dilated with Shawnie Pons dilators to size #29 [place].  The resectoscope was then placed into the uterine  cavity using sorbitol as the distention medium.  Immediately upon entry into the uterine cavity, there was a pedunculated fibroid versus polyp originating from the anterior inferior uterine surface.  Using the resectoscope at 90 watts cutting wave form, this was easily excised.  The remainder of the uterine cavity was thoroughly inspected.  There were some apparent submucosal fibroids in the left uterine cornu.  These were excised with the resectoscope and submitted separately to pathology for histologic studies.  Curettage was then performed.  First, a four-quadrant technique was used with a medium-sized curet.  Then, a therapeutic technique was used.  The uterine curettings were submitted as a third specimen to histology.  The resectoscope was once again placed.  Any small bleeders were easily controlled with the coagulation wave form at 70 watts of power.  Hemostasis was noted and this portion of the procedure terminated.  A Jelcho uterine manipulator was then placed per cervix and held in place with a tenaculum. The bladder was drained with a red rubber catheter.  Attention was then turned to the laparoscopic portion of the procedure.  A 1.5 cm infraumbilical incision was made in the longitudinal plane.  The size 10/11 disposable laparoscopic trocar was then placed into the abdominal cavity.  Placement of the laparoscope immediately revealed numerous adhesions, apparently from the omentum to the anterior abdominal wall.  Once the operator was sure we were in the abdominal cavity, CO2 pneumoperitoneum was created and maintained throughout the procedure.  The lesions were sufficiently extensive that the pelvis could not consistently be visualized until numerous adhesiolysis had been performed, which required approximately 30-40 minutes. Each pedicle was controlled with bipolar cautery and lysed with the scissors, taking careful attention to avoid injury to any bowel or other vital  organs. Once the pelvis was visualized, a thorough survey was performed.  The uterus was normal size, shape and contour without evidence of any fibroids or endometriosis.  The left ovary and fallopian tube were normal.  The right ovary was quite enlarged relative to the left.  It was probably 4-5 cm in diameter.  The fallopian tube appeared partially interrupted in appearance, consistent with previous tubal sterilization procedure.  Peritoneal washings were obtained using the Nezhat suction irrigator.  It should be mentioned that 5 mm ports were placed in the left and right lower quadrants respectively in order to aid in the diagnostic and operative portion of the procedure.  Next, using the tripolar coagulation device, the right ovary and distal portion of the fallopian tube were removed from their blood supply (infundibulopelvic ligament).  The right adnexa was then cut free and allowed to fall into the cul-de-sac for later removal.  There was still a small segment of proximal fallopian tube left.  However, as mentioned above, it appeared to have been previously ligated and, thus, was not felt to be worthy of removal.  The incision in the right lower quadrant was extended in order to accommodate the surgical specimen.  It was placed in an Endopouch bag.  Part of the cystic contents were aspirated in order to diminish the volume.  This was accomplished while it was in the bag and resulted in no spillage intraperitoneally.  The specimen was then successful removed through the anterior abdominal  wall.  It was submitted to pathology for histologic studies.  Hemostasis was noted.  At this point, the patient was felt to have benefited maximally from the surgical procedure.  The abdominal instruments were removed.  The pneumoperitoneum was evacuated.  The fascia in the right lower quadrant was closed with 0 Vicryl in a running fashion.  The fascia elsewhere was closed with 0 Vicryl in  interrupted fashion.  The skin was closed with 3-0 chromic in subcuticular fashion.  The patient was then taken to the recovery room in satisfactory condition.  Dictated by:   Rudy Jew Ashley Royalty, M.D. Attending Physician:  Wandalee Ferdinand DD:  09/06/01 TD:  09/07/01 Job: [id number]

## 2011-01-31 NOTE — Cardiovascular Report (Signed)
[place]  Patient:    [name], [name]                       MRN: [id number] Proc. Date: 02/11/00 Adm. Date:  [id number] Disc. Date: [id number] Attending:  [place], Peter Manning CC:         Redmond Baseman, M.D.                        Cardiac Catheterization  INDICATIONS FOR PROCEDURE:  The patient is a ______-year-old white female with a history of coronary artery disease, status post previous angioplasty of an obtuse marginal bifurcation lesion and previous stenting of the crux in the right coronary artery.  She presents with refractory chest pain which appears to largely be stress related.  ACCESS:  Via the right femoral artery using the standard Seldinger technique.  EQUIPMENT:  A 6 French 4 cm right and left Judkins catheter, 6 French pigtail catheter, 6 French arterial sheath.  MEDICATIONS:  Local anesthesia with 1% Xylocaine.  CONTRAST:  Omnipaque 110 cc.  HEMODYNAMIC DATA:  Aortic pressure is 148/79 with a mean of 101 mmHg.  Left ventricular pressure is 146 with an EDP of 18 mmHg.  ANGIOGRAPHIC DATA:  Left coronary artery:  The left coronary artery arises and distributes normally.  Left main:  The left main coronary artery is normal.  Left anterior descending:  The left anterior descending artery has minor wall irregularities in the mid vessel, less than 20%.  Left circumflex:  The left circumflex coronary artery has minor wall irregularities.  At the bifurcation of the first and second obtuse marginal vessels there is less than 20% residual stenosis at the site of the previous angioplasty.  Right coronary artery:  The right coronary artery arises and distributes normally.  There are diffuse wall irregularities up to 20% with a 30% narrowing just proximal to the prior stent.  The stented segment at the crux is widely patent.  LEFT VENTRICULAR ANGIOGRAPHY:  The left ventricular angiography demonstrates normal left  ventricular size and contractility with no segmental wall motion abnormalities.  Ejection fraction is estimated at 65%.  There is mild mitral valve prolapse without regurgitation.  FINAL INTERPRETATION: 1. Nonobstructive atherosclerotic coronary artery disease.  There is continued    long-term patency at the previously angioplastied site at the first and    second obtuse marginal vessel bifurcation.  There is also continued    long-term patency of the stent in the right coronary artery. 2. Normal left ventricular function. 3. Mild mitral valve prolapse.  PLAN:  Continue medical therapy. DD:  02/12/00 TD:  02/13/00 Job: [id number]

## 2011-01-31 NOTE — H&P (Signed)
[facility]  Patient:    [name], [name] Visit Number: [id number] MRN: [id number]          Service Type: DSU Location: [facility] Attending Physician:  Wandalee Ferdinand Dictated by:   Rudy Jew Ashley Royalty, M.D. Admit Date:  09/03/2001 Discharge Date: 09/03/2001                           History and Physical  HISTORY OF PRESENT ILLNESS:   This is a 65 year old gravida 3, para 3 with a long history of an apparent uterine fibroid and recent abnormal uterine bleeding.  She has also been found on recent ultrasound to have a right adnexal cyst, which has been persistent.  She presents for diagnostic/operative hysteroscopy and diagnostic/operative laparoscopy with removal of right ovarian cyst.  MEDICATIONS:                  Tetracycline, ______ Lipitor, ______, Wellbutrin, aspirin, and nitroglycerin p.r.n.  PAST MEDICAL HISTORY:         Atherosclerotic heart disease, hypertension, hypercholesterolemia, depression, rosacea, history of right breast cancer, and right mastectomy.  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  ALLERGY - PENICILLIN.  FAMILY HISTORY:               Positive for hypertension, diabetes and breast cancer.  SOCIAL HISTORY:               The patient denies use of significant alcohol. She is a smoker.  REVIEW OF SYSTEMS:            Noncontributory.  PHYSICAL EXAMINATION:  GENERAL:                      Well-developed, well-nourished pleasant white female in no acute distress.  Afebrile, vital signs stable.  CHEST/LUNGS:                  Clear.  CARDIAC:                      Regular rate and rhythm without murmurs, gallops or rubs.  BREAST EXAMINATION:           Deferred.  ABDOMEN:                      Soft, nontender, without masses or organomegaly. Bowel sounds were active.  MUSCULOSKELETAL:              Reveals  full range of motion without edema, cyanosis or CVA tenderness.  PELVIC EXAMINATION:           Deferred until examination under anesthesia.  IMPRESSION:                    1. Right adnexal cyst.                                2. Intrauterine polyp versus fibroid.                                3. Abnormal uterine bleeding.  4. Atherosclerotic heart disease.                                5. History of right breast carcinoma.                                6. Status post right mastectomy.                                7. History of CIS of the vulva.                                8. Rosacea.                                9. Hypertension.                               10. Hypercholesterolemia.                               11. Depression.                               12. History of noncompliant with GYN care.  PLAN:                          1. Diagnostic/operative laparoscopy with                                   ovarian cystectomy or right                                   salpingo-oophorectomy.                                2. Diagnostic/operative hysteroscopy.                                3. Dilatation and curettage.                                Risks, benefits, complications, and the alternatives were fully discussed with the patient.  She states, she understands and accepts.  Questions were invited and answered. Dictated by:   Rudy Jew Ashley Royalty, M.D. Attending Physician:  Wandalee Ferdinand DD:  09/06/01 TD:  09/06/01 Job: [id number]

## 2011-01-31 NOTE — Discharge Summary (Signed)
[name], [name]                ACCOUNT NO.:  [id number]   MEDICAL RECORD NO.:  [id number]          PATIENT TYPE:  INP   LOCATION:  0372                         FACILITY:  [facility]   PHYSICIAN:  Melissa L. Ladona Ridgel, MD  DATE OF BIRTH:  Aug 02, 1946   DATE OF ADMISSION:  06/13/2004  DATE OF DISCHARGE:  06/17/2004                                 DISCHARGE SUMMARY   DISCHARGE DIAGNOSES:  1.  Exacerbation of chronic obstructive pulmonary disease, with pneumonia      bilaterally in the upper lung fields.  The patient is improved, but not      over the acute phase of her illness at this time.  However, she is      insistent on discharging to home.  We will therefore attempt to      accommodate her with the provision that she follow closely with her      primary care physician and that she continue with home nebulizer      treatments and home oxygen until cleared by her family physician.  2.  Diabetes.  The patient is having steroid-induced hyperglycemia, but at      baseline is showing an elevated hemoglobin A1c suggestive of glucose      intolerance.  Her hemoglobin A1c during this admission was 6.5.  She      will be discharged to home on Glucotrol 5 mg daily; metformin 500 mg      daily; which needs to be restarted on June 19, 2004.  The Glucophage      was held because of intravenous contrast given for her computed      tomography scan yesterday.  She also will be provided with Lantus 15      units subcutaneous at bedtime to be self-administered by the patient,      and she will be provided with a glucose meter form so that she can      purchase her Glucometer.  She is to check her blood sugars before meals      and at bedtime, and to record those results for Dr. Modesto Charon.  She is to      call Dr. Nash Dimmer office if her blood sugars are greater than 300 or less      than 80.  She will be given instructions that if her blood sugar is less      than 70 to hold her Glucotrol and Lantus and drink  some juice and eat a      snack as well as recheck her sugars after the snack to ensure that she      has adequately responded.  3.  Cardiovascular.  The patient has a history of coronary artery disease,      treated by Dr. Peter Swaziland.  At present, she continues on her Cardizem      180 mg daily.  I have resumed her Lopressor at 12.5 mg b.i.d. out of      concern for exacerbation of her underlying lung disease.  She appears no      worse on the low-dose  Lopressor.  The patient can have Dr. Swaziland up-      titrate her Lopressor at a later time when she is feeling improved.  She      will continue on her Lipitor 10 mg at bedtime and her aspirin 325 mg      once daily.  4.  Chronic pain.  The patient will continue with her home doses of      OxyContin 20 mg t.i.d. and 15 mg of oxycodone immediate release q.4 h.      p.r.n., as well as Neurontin 600 mg t.i.d.  5.  Gastrointestinal prophylaxis.  Will resume with Zantac 150 mg daily      while on prednisone, and thereafter according to her previous schedule.  6.  Oral yeast infection.  Has resolved status post Diflucan 100 mg p.o.      daily x 3 days.  We will discontinue this at this time and have her      follow up with Dr. Modesto Charon for any further symptomatology.  7.  Anxiety.  The patient will resume her Xanax p.r.n.   MEDICATIONS AT THE TIME OF DISCHARGE:  1.  The patient will be instructed to discontinue the Biaxin started as an      outpatient.  2.  Moxifloxacin 400 mg p.o. daily x10 more days.  3.  Prednisone 60 mg p.o. daily until decreased by Dr. Modesto Charon.  Dr. Modesto Charon will      need to schedule a taper of this after seeing her.  I would recommend a      very slow taper.  4.  Albuterol 2.5 mg nebulizer q.4 h. and q.2 h. p.r.n.  5.  Atrovent 0.5 mg nebulizer q.4 h.  6.  Oxygen 2 liters continuous until discontinued by Dr. Modesto Charon.  7.  Lantus 15 units subcutaneous at bedtime.  8.  Glucotrol 5 mg p.o. q.a.m.  9.  Metformin 500 mg p.o. daily at  dinnertime, and this should be held until      June 19, 2004, at which time she can restart that, since she has had      IV contrast with her CT.  10. Lipitor 10 mg at bedtime.  11. Neurontin 600 mg p.o. t.i.d.  12. Lopressor 12.5 mg b.i.d.  13. Zantac 150 mg p.o. daily.  14. Aspirin 325 mg daily.  15. Cardia 180 mg daily.  16. Humibid 600 mg p.o. b.i.d.  17. Oxycodone 20 mg t.i.d.  18. Oxycodone immediate release 15 mg q.4 h. p.r.n.  19. Pain management as stated will be with her Neurontin, OxyContin, and      oxycodone, as previously prescribed by Dr. Ethelene Hal.   ACTIVITY:  The patient will not be cleared to return to work until she sees  Dr. Modesto Charon.  She is to use continuous O2 until discontinued by Dr. Modesto Charon, and  follow the prescribed nebulizer treatments as well as continue with her  prednisone and antibiotic therapy.   DIET:  The patient will be given information on a carb-modified medium diet,  and will be given an outpatient diabetic management consult.   WOUND CARE:  Not applicable.   SPECIAL INSTRUCTIONS:  She is to call Dr. Modesto Charon if her blood sugars go  greater than 300 or less than 80.  If they are less than 70, she is to eat a  snack and drink a glass of juice.   FOLLOWUP:  With Dr. Modesto Charon on Wednesday to check her diabetes, follow up her  prednisone, nebulizers, and oxygen therapy, and potentially clear her for  work.   HISTORY OF PRESENT ILLNESS:  The patient is a 65 year old white female with  an extensive past medical history for COPD, coronary artery disease, and  chronic pain, who presented to the emergency room with a productive cough  for greater than a week and an increase in shortness of breath.  The patient  had been evaluated as an outpatient and started on Biaxin, but her symptoms  became more prominent to the point where she could not breathe.  She was admitted to the telemetry floor, where she was found to have a saturation of  86% on room air.   She  was started on O2, steroids, and antibiotics.  It was noted that she had  hyperglycemia likely related to her steroids, but a hemoglobin A1c confirmed  probably glucose intolerance at baseline.  She was therefore started on oral  hypoglycemics as well as Lantus insulin.  The plan is for hopefully the  Lantus to be discontinued when she is off the steroids, with further diet  management and possibly just oral agents at baseline.  During the course of  the hospital stay, the patient continued to exhibit significant wheezing and  rhonchi.  She was found to be desaturated into the low 80s with exertion on  the day prior to discharge and was convinced to stay in the hospital for  further treatment.  A CT of the chest was obtained in order to rule out  possible PE since she did continue to have significant hypoxia and shortness  of breath.  The chest CT revealed bilateral upper lobe infiltrates and  bilateral lower lobe atelectasis, but no evidence for PE.  She therefore was  continued on the current regime of steroids, antibiotics, and pulmonary  toilette, with nebulizers q.4 h.  Despite continued dyspnea on exertion and  persistent wheezing, the patient is insistent on being discharged to home.  She has been quite unrealistic about her current pulmonary symptoms and is  bound and determined to return to work as soon as possible despite the  request to allow her lungs to heal and her symptoms to improve before  returning.  Educational materials were provided to the patient with regard  to her COPD and diabetes, and smoking cessation information was provided, as  was a stern warning that if she continued to smoke that she would most  likely return to the hospital emergently.   On the day of discharge, the patient's vital signs were stable.  Her  temperature was 97.3; blood pressure 131/69; pulse 69; respiratory rate 20.  She is 93% on 4 liters.  Her glucoses have ranged from 186 to a high of  283.  Generally, she is well developed, well nourished, in no acute distress at  rest, but with significant dyspnea on exertion.  Normocephalic, atraumatic.  Pupils are equal, round, reactive to light.  Extraocular muscles are intact.  Mucous membranes are moist.  Her neck is supple.  There is no JVD, no lymph  nodes.  Her chest reveals scattered rhonchi and end-expiratory wheezes, with  decreased air entry.  Cardiovascular is regular rate and rhythm.  Positive  S1, S2.  No S3, S4.  No murmurs, rubs, or gallops.  Abdomen is soft,  nontender, nondistended, with positive bowel sounds.  Extremities showed 2+  pulses, with no clubbing, cyanosis, or edema.  Neurologically, she is  nonfocal.  However, her judgment is at times impaired  by her desire to be  independent.   PERTINENT LABORATORY VALUES DURING THE COURSE OF THIS HOSPITAL STAY: Negative blood cultures x2 sets.  Mild oropharyngeal candidiasis has been  treated with oral Diflucan.  Her glycosylated hemoglobin was 6.5, slightly  elevated.  Her BNP during the hospital stay was 50.3.  Her last CBC revealed  a white count of 15.6, with a hemoglobin of 13.2, hematocrit 37.7, and  platelets 341.  Her most recent chemistry revealed BUN 12, creatinine 0.6.  Her LFTs revealed AST 23, ALT 18, alk phos 158, and total bilirubin 0.8.  Urinalysis was negative.  Cardiac enzymes x 1 were also negative.   At this time, this patient is deemed in fair condition, insisting upon  discharge to home.  We have therefore provided her with the most aggressive  home therapy that can be provided as an outpatient in order to assist her in  recovering.  We have warned her about the risks for possible return and have  educated her on her disease processes and the signs and symptoms that should  alert her to call her primary care physician.  At this time, the patient  will be discharged to home in fair condition, with followup to Dr. Modesto Charon on  Wednesday.      Meli   MLT/MEDQ  D:  06/17/2004  T:  06/18/2004  Job:  [id number]   cc:   Maryla Morrow. Modesto Charon, M.D.  [address]  [place]  [place] [address]  Fax: [phone]   Peter M. Swaziland, M.D.  [address], [address]  [place], [place] [address]  Fax: [phone]

## 2011-02-04 ENCOUNTER — Telehealth: Payer: Self-pay | Admitting: Cardiology

## 2011-02-04 ENCOUNTER — Other Ambulatory Visit: Payer: Self-pay | Admitting: Orthopedic Surgery

## 2011-02-04 ENCOUNTER — Encounter (HOSPITAL_COMMUNITY): Payer: Managed Care, Other (non HMO)

## 2011-02-04 LAB — PROTIME-INR: Prothrombin Time: 13.5 seconds (ref 11.6–15.2)

## 2011-02-04 LAB — DIFFERENTIAL
Basophils Relative: 0 % (ref 0–1)
Basophils Relative: 0 % (ref 0–1)
Eosinophils Absolute: 0.2 10*3/uL (ref 0.0–0.7)
Eosinophils Absolute: 0.2 10*3/uL (ref 0.0–0.7)
Eosinophils Relative: 2 % (ref 0–5)
Lymphs Abs: 2.5 10*3/uL (ref 0.7–4.0)
Lymphs Abs: 2.5 10*3/uL (ref 0.7–4.0)
Monocytes Absolute: 1 10*3/uL (ref 0.1–1.0)
Monocytes Relative: 11 % (ref 3–12)
Monocytes Relative: 11 % (ref 3–12)
Neutro Abs: 5.5 10*3/uL (ref 1.7–7.7)
Neutrophils Relative %: 59 % (ref 43–77)

## 2011-02-04 LAB — CBC
Hemoglobin: 14.2 g/dL (ref 12.0–15.0)
MCH: 28.9 pg (ref 26.0–34.0)
MCHC: 33.6 g/dL (ref 30.0–36.0)
MCV: 85.9 fL (ref 78.0–100.0)

## 2011-02-04 LAB — URINALYSIS, ROUTINE W REFLEX MICROSCOPIC
Bilirubin Urine: NEGATIVE
Glucose, UA: NEGATIVE mg/dL
Hgb urine dipstick: NEGATIVE
Ketones, ur: NEGATIVE mg/dL
Protein, ur: NEGATIVE mg/dL
Urobilinogen, UA: 0.2 mg/dL (ref 0.0–1.0)

## 2011-02-04 LAB — BASIC METABOLIC PANEL
CO2: 30 mEq/L (ref 19–32)
Chloride: 97 mEq/L (ref 96–112)
GFR calc Af Amer: >60 mL/min (ref >60)
Glucose, Bld: 69 mg/dL — ABNORMAL LOW (ref 70–99)
Potassium: 3.8 mEq/L (ref 3.5–5.1)
Sodium: 137 mEq/L (ref 135–145)

## 2011-02-04 LAB — SURGICAL PCR SCREEN: MRSA, PCR: NEGATIVE

## 2011-02-11 ENCOUNTER — Inpatient Hospital Stay (HOSPITAL_COMMUNITY): Payer: Managed Care, Other (non HMO)

## 2011-02-11 ENCOUNTER — Inpatient Hospital Stay (HOSPITAL_COMMUNITY)
Admission: RE | Admit: 2011-02-11 | Discharge: 2011-02-13 | DRG: 468 | Disposition: A | Discharge status: Home Health | Payer: Managed Care, Other (non HMO) | Source: Ambulatory Visit | Attending: Orthopedic Surgery | Admitting: Orthopedic Surgery

## 2011-02-11 DIAGNOSIS — I2510 Atherosclerotic heart disease of native coronary artery without angina pectoris: Secondary | ICD-10-CM | POA: Diagnosis present

## 2011-02-11 DIAGNOSIS — Z01812 Encounter for preprocedural laboratory examination: Secondary | ICD-10-CM

## 2011-02-11 DIAGNOSIS — G8929 Other chronic pain: Secondary | ICD-10-CM | POA: Diagnosis present

## 2011-02-11 DIAGNOSIS — I10 Essential (primary) hypertension: Secondary | ICD-10-CM | POA: Diagnosis present

## 2011-02-11 DIAGNOSIS — E119 Type 2 diabetes mellitus without complications: Secondary | ICD-10-CM | POA: Diagnosis present

## 2011-02-11 DIAGNOSIS — J449 Chronic obstructive pulmonary disease, unspecified: Secondary | ICD-10-CM | POA: Diagnosis present

## 2011-02-11 DIAGNOSIS — M549 Dorsalgia, unspecified: Secondary | ICD-10-CM | POA: Diagnosis present

## 2011-02-11 DIAGNOSIS — Z96649 Presence of unspecified artificial hip joint: Secondary | ICD-10-CM

## 2011-02-11 DIAGNOSIS — T84029A Dislocation of unspecified internal joint prosthesis, initial encounter: Principal | ICD-10-CM | POA: Diagnosis present

## 2011-02-11 DIAGNOSIS — J4489 Other specified chronic obstructive pulmonary disease: Secondary | ICD-10-CM | POA: Diagnosis present

## 2011-02-11 DIAGNOSIS — F17200 Nicotine dependence, unspecified, uncomplicated: Secondary | ICD-10-CM | POA: Diagnosis present

## 2011-02-11 DIAGNOSIS — F341 Dysthymic disorder: Secondary | ICD-10-CM | POA: Diagnosis present

## 2011-02-11 DIAGNOSIS — Z9861 Coronary angioplasty status: Secondary | ICD-10-CM

## 2011-02-11 DIAGNOSIS — Y831 Surgical operation with implant of artificial internal device as the cause of abnormal reaction of the patient, or of later complication, without mention of misadventure at the time of the procedure: Secondary | ICD-10-CM | POA: Diagnosis present

## 2011-02-11 LAB — GLUCOSE, CAPILLARY
Glucose-Capillary: 103 mg/dL — ABNORMAL HIGH (ref 70–99)
Glucose-Capillary: 104 mg/dL — ABNORMAL HIGH (ref 70–99)
Glucose-Capillary: 132 mg/dL — ABNORMAL HIGH (ref 70–99)

## 2011-02-11 LAB — TYPE AND SCREEN: ABO/RH(D): O POS

## 2011-02-11 NOTE — H&P (Signed)
[name], [name]                ACCOUNT NO.:  [id number]  MEDICAL RECORD NO.:  [id number]           PATIENT TYPE:  O  LOCATION:  PADM                         FACILITY:  [facility]  PHYSICIAN:  Madlyn Frankel. Charlann Boxer, M.D.  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  HISTORY OF PRESENT ILLNESS:  This a 65 year old lady with a history of bilateral total hip arthroplasty with the right hip having multiple dislocations since the surgery.  After discussion of treatment, benefits, risks and options with Dr. Charlann Boxer, the patient is now scheduled for total hip arthroplasty revision on the right.  Note that she is not a candidate for tranexamic acid, will not receive that in preop and that she will be given her prescriptions for aspirin 325, 1 p.o. b.i.d. postop for DVT prophylaxis, Robaxin, iron, MiraLax, and Colace.  PAST MEDICAL HISTORY:  Drug allergies to PENICILLIN with rash.  CURRENT MEDICATIONS: 1. Nitroglycerin 0.4 mg as needed p.r.n. 2. Alprazolam 0.25 mg as needed q.6 h. p.r.n. 3. Metoprolol 100 mg 1 p.o. daily. 4. Losartan 25 mg 1 p.o. daily. 5. Lasix 40 mg 1 p.o. daily. 6. Simvastatin 10 mg 1 p.o. daily. 7. Trilipix DR 135 mg 1 p.o. daily. 8. Diltiazem 24-hour ER 180 mg 1 daily. 9. Gabapentin 600 mg 1 q.i.d. 10.Metformin 500 mg 1 b.i.d. 11.Aspirin 325 mg 1 daily. 12.Lantus insulin sliding scale. 13.Oxycodone 15 mg. 14.Morphine 30 mg.  SERIOUS MEDICAL ILLNESSES:  Coronary artery disease, hypertension, hyperlipidemia, diabetes, and chronic pain from her back.  PREVIOUS SURGERIES:  Tonsillectomy, vulvectomy, C-section x3, heart cath, heart stent, cervical fusion, bilateral total hips, thyroid excision, and right ovary excision.  FAMILY HISTORY:  Positive for diabetes.  SOCIAL HISTORY:  The patient  is married.  She is an Airline pilot.  She has 3 drinks a day and smokes 1 pack per day.  REVIEW OF SYSTEMS:  CENTRAL NERVOUS SYSTEM:  Negative for headache, blurred vision, or dizziness.  Positive for chronic pain secondary to her back.  PULMONARY: Positive for COPD with emphysema and chronic bronchitis.  CARDIOVASCULAR:  Positive for coronary artery disease, hypercholesterolemia, history of MI in 1998, and a history of congestive heart failure.  GU:  Positive for urinary tract difficulty.  GI: Negative for ulcers or hepatitis.  MUSCULOSKELETAL:  Positive as in HPI.  PHYSICAL EXAMINATION:  VITAL SIGNS:  BP 130/78, respirations 16, pulse 78 and regular. GENERAL APPEARANCE:  This is a well-developed, well-nourished lady in no acute distress. HEENT:  Head normocephalic.  Nose patent.  Ears patent.  Pupils are equal, round, and reactive to light.  Throat without injection. NECK:  Supple without adenopathy.  Carotids 2+ without bruit. CHEST:  Clear to auscultation.  No rales or rhonchi.  Respirations 16. HEART:  Regular rate and rhythm at 68 beats per minute without murmur. ABDOMEN:  Soft with active bowel sounds.  No masses or organomegaly. NEUROLOGIC:  The patient is alert and oriented to time, place, and person.  Cranial nerves II-XII grossly intact. EXTREMITIES:  Right hip with decreased range of motion secondary to history of dislocations.  Neurovascular status intact.  Note that the patient had a sebaceous cyst on her right posterior thorax.  This was I&D'd by her family doctor.  It is currently healing. There is no evidence of infection.  The wound is not closed completely. We will need to check this prior to surgery.  ASSESSMENT:  Status post right total hip arthroplasty with multiple dislocations.  Plan is total hip revision on the right.     Jaquelyn Bitter. Chabon, P.A.   ______________________________ Madlyn Frankel Charlann Boxer, M.D.    SJC/MEDQ  D:  02/05/2011  T:  02/05/2011  Job:   [id number]  Electronically Signed by Jodene Nam P.A. on 02/07/2011 08:07:13 AM Electronically Signed by Durene Romans M.D. on 02/11/2011 08:53:42 AM

## 2011-02-12 LAB — BASIC METABOLIC PANEL
BUN: 5 mg/dL — ABNORMAL LOW (ref 6–23)
CO2: 26 mEq/L (ref 19–32)
Calcium: 8.3 mg/dL — ABNORMAL LOW (ref 8.4–10.5)
Creatinine, Ser: <0.47 mg/dL (ref 0.4–1.2)
Glucose, Bld: 110 mg/dL — ABNORMAL HIGH (ref 70–99)
Sodium: 137 mEq/L (ref 135–145)

## 2011-02-12 LAB — GLUCOSE, CAPILLARY: Glucose-Capillary: 123 mg/dL — ABNORMAL HIGH (ref 70–99)

## 2011-02-12 LAB — CBC
HCT: 38.6 % (ref 36.0–46.0)
Hemoglobin: 12.6 g/dL (ref 12.0–15.0)
MCH: 28.7 pg (ref 26.0–34.0)
MCHC: 32.6 g/dL (ref 30.0–36.0)
MCV: 87.9 fL (ref 78.0–100.0)
RDW: 16.4 % — ABNORMAL HIGH (ref 11.5–15.5)

## 2011-02-13 LAB — BASIC METABOLIC PANEL
BUN: 4 mg/dL — ABNORMAL LOW (ref 6–23)
Chloride: 99 mEq/L (ref 96–112)
GFR calc Af Amer: >60 mL/min (ref >60)
GFR calc non Af Amer: >60 mL/min (ref >60)
Glucose, Bld: 101 mg/dL — ABNORMAL HIGH (ref 70–99)
Potassium: 4.1 mEq/L (ref 3.5–5.1)

## 2011-02-13 LAB — CBC
MCHC: 32.9 g/dL (ref 30.0–36.0)
MCV: 88.3 fL (ref 78.0–100.0)
Platelets: 221 10*3/uL (ref 150–400)
RBC: 4.2 MIL/uL (ref 3.87–5.11)
RDW: 16.5 % — ABNORMAL HIGH (ref 11.5–15.5)
WBC: 10.5 10*3/uL (ref 4.0–10.5)

## 2011-02-13 LAB — GLUCOSE, CAPILLARY: Glucose-Capillary: 111 mg/dL — ABNORMAL HIGH (ref 70–99)

## 2011-02-16 NOTE — Op Note (Signed)
[name], [name]                ACCOUNT NO.:  [id number]  MEDICAL RECORD NO.:  [id number]           PATIENT TYPE:  I  LOCATION:  1617                         FACILITY:  [facility]  PHYSICIAN:  Madlyn Frankel. Charlann Boxer, M.D.  DATE OF BIRTH:  [birth year]  DATE OF PROCEDURE:  02/11/2011 DATE OF DISCHARGE:                              OPERATIVE REPORT   PREOPERATIVE DIAGNOSIS:  Right total hip replacement instability.  POSTOPERATIVE DIAGNOSIS:  Right total hip replacement instability.  PROCEDURE:  Revision right total hip with conversion to a size 56 Pinnacle Gription cup with a 36 +4 neutral liner and a 36 +5 Delta ceramic ball with inner sleeve.  SURGEON:  Madlyn Frankel. Charlann Boxer, M.D.  ASSISTANT:  Jaquelyn Bitter. Chabon, P.A.C.  ANESTHESIA:  General.  BLOOD LOSS:  About 200 cc.  DRAINS:  None.  COMPLICATIONS:  None.  SPECIMENS:  None.  INDICATIONS FOR PROCEDURE:  [name] is a 65 year old female who underwent bilateral total hip replacement back in 2011.  Unfortunately in the early postop, she was noted to have a dislocation and had one other dislocation.  After reviewing her current situation, it was uncertain, she appeared to have a good abduction, however, and forward flexion cup appeared to be changed in the intraoperative observation.  Given her instability and her age, I wished to go back and did primary revision as oppose to any other alternatives particularly focussed on the acetabular shell.  After reviewing risks and benefits, consent was obtained for benefit of improvement of stability, pain relief and functional improvement.  PROCEDURE IN DETAIL:  The patient was brought to operative theater. Once adequate anesthesia, preoperative antibiotics, vancomycin administered due to recent sebaceous cyst excision with diagnosis of MRSA plan.  The patient was positioned into the left lateral decubitus position with the right side up.  The right hip was prepped and draped in  sterile fashion.  Time-out was performed identifying the patient, planned procedure and the extremity.  An incision was made over the old incision excising old scar.  Sharp dissection was carried down to the iliotibial band.  There were no signs infection in the superficial layers.  As I entered the joint capsule and pseudocapsule, it appeared to be significantly scarred in with normal synovial fluid upon entry of the joint space.  Following exposure, initial debridement of pseudocapsular tissues allowed for exposure, the hip was dislocated.  It appeared to be a definite change in cup orientation from what I could remember as the cup at this point was no longer flexed beneath the anterior rim but instead was relatively neutral with regards to its forward flexion.  The hip was dislocated, the femoral head was removed after elevating soft tissues off the ilium, the trunnion was placed proximal and retractors placed.  The previous liner was removed followed by removal of the cancellous iliac screw.  There was no evidence any metal wear. At this point, I reapplied the liner and then used a cup extractor set from in [employer] and removed the old shell which is a  54 mm shell.  After visually inspecting the cup, there was very little  bone loss, however, I reamed up to 55 reamer to place the 56 cup.  I was able to ream a little bit more medial.  A 56 Gription cup was chosen, it was impacted with a curve impactor.  At this point, it was definitely beneath the anterior rim of wall anteriorly about 5 mm, a portion of the cup superolateral.  I placed 2 cancellous screws to initially support this Gription cup and then chose a 36 neutral +4 liner.  I removed the 36 +1.5 ball in an effort to provide stability.  I did trial with 36 +5 ball, finding combined anteversion at this time and restored back to normal which was about 45-50 degrees.  There was no evidence of any impingement or subluxation.   Given these findings, I chose a 36 +5 Delta ceramic ball with TS inner sleeve liner.  The 36 ball was impacted on to clean and dry trunnion and the hip was reduced.  We irrigated the hip throughout the case and again at this point.  There was no real capsular tissue to reapproximate, so I just laid it over the top, placed a medium Hemovac drain deep and then reapproximated the iliotibial band and gluteal fascia using #1 Vicryl. The remainder of the wound was closed with 2-0 Vicryl and running 4-0 Monocryl.  Hip was cleaned, dried and dressed sterilely with Dermabond and Aquacel dressing, drain site dressed separately.  She was then brought to recovery room, extubated in stable condition with a knee immobilizer placed.     Madlyn Frankel Charlann Boxer, M.D.     MDO/MEDQ  D:  02/11/2011  T:  02/11/2011  Job:  [id number]  Electronically Signed by Durene Romans M.D. on 02/16/2011 04:25:52 PM

## 2011-02-16 NOTE — Discharge Summary (Signed)
[name], [name]                ACCOUNT NO.:  [id number]  MEDICAL RECORD NO.:  [id number]           PATIENT TYPE:  I  LOCATION:  1617                         FACILITY:  [facility]  PHYSICIAN:  Madlyn Frankel. Charlann Boxer, M.D.  DATE OF BIRTH:  [birth year]  DATE OF ADMISSION:  02/11/2011 DATE OF DISCHARGE:  02/13/2011                              DISCHARGE SUMMARY   ADMITTING DIAGNOSIS:  Status post right total hip replacement and instability.  DISCHARGE DIAGNOSES: 1. Instability right total hip replacement, status post revision right     total hip replacement, Feb 11, 2011. 2. Anxiety, depression. 3. Hypertension. 4. Chronic back pain. 5. Hyperlipidemia. 6. Coronary artery disease. 7. Diabetes, on multiple medications.  ADMITTING HISTORY:  [name] is a 65 year old female with a history of bilateral total hip replacement, done in December 2011.  She, unfortunately, had early dislocation.  She dislocated at the top and based on this history and discussion, did not want to have it occur any more.  Her radiographs revealed concerns of potential movement of her acetabular shell in relation to the initial surgical procedure.  Her left hip remained stable.  After reviewing risks and benefits of the planned procedure which was going to be an isolated acetabular revision, consent was obtained.  HOSPITAL COURSE:  The patient admitted for same-day surgery on Feb 11, 2011.  Please see dictated operative note for full details of the procedure.  She underwent a successful right total hip revision for acetabular cup orientation.  After routine stay in the recovery room, she was transferred to orthopedic ward.  Postoperative day #1, her Foley catheter and Hemovac drain was removed.  Actually, her IV was removed at that time as well. Other than some pain issues initially, she settled down about day #2, actually ready for discharge.  On postoperative day 1, she was noted have a hematocrit of 38.6.   On day #2, it was 37.1.  Her electrolytes remained stable.  Her right hip wound was clean and dry.  She was seen and evaluated by Physical Therapy, designated to be weightbearing as tolerated with posterior hip precautions. Postoperative radiographs appeared good.  DISCHARGE CONDITION:  Stable.  DISCHARGE INSTRUCTIONS:  The patient will be discharged to home health physical therapy to work on range of motion, strengthening, gait training, posterior hip precautions to be mindful.  We will see her back in the office in 2 to 3 weeks for clinical followup.  She may shower as long as she keeps her wound dry.  She can have regular diet other than her diabetic management.  DISCHARGE MEDICATIONS: 1. Aspirin 325 mg b.i.d. for 30 days. 2. Iron 325 mg 2 to 3 times a day for couple of weeks only. 3. Oxycodone 10 mg 1 to 2 tablets every 4 to 6 hours as needed for     pain. 4. MiraLax 17 g p.o. daily as needed for constipation while on pain     medicine. 5. Albuterol 2 puffs every 6 hours as needed for shortness of breath. 6. Biotene 1 tablet daily. 7. Colace 100 mg b.i.d. as noted. 8. Diltiazem 180 mg  q.a.m. 9. Doxycycline 100 mg which she finished preoperatively. 10.Furosemide 40 mg q.a.m. 11.Gabapentin 600 mg 4 times daily. 12.Lantus 25 mg subcutaneous q.h.s. 13.Losartan 25 mg q.a.m. 14.Metformin 500 mg b.i.d. 15.Robaxin 500 mg q.6h. as needed for muscle spasm pain. 16.Metoprolol 100 mg daily. 17.Morphine 30 mg q.i.d. as needed. 18.Nitroglycerin sublingual 0.4 mg as needed. 19.Trilipix 135 mg p.o. q.a.m. 20.Xanax 0.25 mg q.8h. as needed for anxiety.     Madlyn Frankel Charlann Boxer, M.D.     MDO/MEDQ  D:  02/13/2011  T:  02/13/2011  Job:  [id number]  Electronically Signed by Durene Romans M.D. on 02/16/2011 04:25:55 PM

## 2011-04-03 ENCOUNTER — Other Ambulatory Visit: Payer: Self-pay | Admitting: Cardiology

## 2011-04-03 DIAGNOSIS — E7800 Pure hypercholesterolemia, unspecified: Secondary | ICD-10-CM

## 2011-04-03 MED ORDER — ALPRAZOLAM 0.25 MG PO TABS: ORAL_TABLET | ORAL | Status: DC

## 2011-04-03 NOTE — Telephone Encounter (Signed)
Called needing a refill of Alprazolam .25mg  filled at [facility] at [facility] [phone]. Please call back feel free to leave a message, or, send an email. I have pulled the chart.

## 2011-04-03 NOTE — Telephone Encounter (Signed)
Called requesting refill on alprazolam. Also wants to get lab work when comes in in Sept. Scheduled her labs for 05/30/11

## 2011-04-03 NOTE — Telephone Encounter (Signed)
escribe medication per fax request  

## 2011-04-15 NOTE — Telephone Encounter (Signed)
No note necessary for this encounter. °

## 2011-04-23 ENCOUNTER — Other Ambulatory Visit: Payer: Self-pay | Admitting: Cardiology

## 2011-04-23 NOTE — Telephone Encounter (Signed)
escribe medication per fax request  

## 2011-05-14 ENCOUNTER — Other Ambulatory Visit: Payer: Self-pay | Admitting: Family Medicine

## 2011-05-14 ENCOUNTER — Telehealth: Payer: Self-pay | Admitting: Cardiology

## 2011-05-14 DIAGNOSIS — Z9011 Acquired absence of right breast and nipple: Secondary | ICD-10-CM

## 2011-05-14 DIAGNOSIS — Z1231 Encounter for screening mammogram for malignant neoplasm of breast: Secondary | ICD-10-CM

## 2011-05-14 MED ORDER — ALPRAZOLAM 0.25 MG PO TABS: ORAL_TABLET | ORAL | Status: DC

## 2011-05-14 NOTE — Telephone Encounter (Signed)
Left leg and foot are swelling and pt is at work.  Pt would like to speak to nurse.  Chart in box.  Please call pt back.

## 2011-05-14 NOTE — Telephone Encounter (Signed)
Called stating that (L) foot and ankle are swelling; only slightly in (R). Is taking Lasix 40 mg daily. Per Dr. [place] may take 80 mg  X 3 days. Is scheduled to see Dr. [place] 9/14. Also wants 90 day supply on Alprazolam 0/25 sent to [facility]. Will call in.

## 2011-05-20 ENCOUNTER — Encounter: Payer: Self-pay | Admitting: Cardiology

## 2011-05-30 ENCOUNTER — Other Ambulatory Visit (INDEPENDENT_AMBULATORY_CARE_PROVIDER_SITE_OTHER): Payer: Managed Care, Other (non HMO) | Admitting: *Deleted

## 2011-05-30 ENCOUNTER — Ambulatory Visit
Admission: RE | Admit: 2011-05-30 | Discharge: 2011-05-30 | Disposition: A | Discharge status: Home / Self Care | Payer: Managed Care, Other (non HMO) | Source: Ambulatory Visit | Attending: Family Medicine | Admitting: Family Medicine

## 2011-05-30 ENCOUNTER — Ambulatory Visit (INDEPENDENT_AMBULATORY_CARE_PROVIDER_SITE_OTHER): Payer: Managed Care, Other (non HMO) | Admitting: Cardiology

## 2011-05-30 ENCOUNTER — Encounter: Payer: Self-pay | Admitting: Cardiology

## 2011-05-30 DIAGNOSIS — E119 Type 2 diabetes mellitus without complications: Secondary | ICD-10-CM

## 2011-05-30 DIAGNOSIS — I10 Essential (primary) hypertension: Secondary | ICD-10-CM

## 2011-05-30 DIAGNOSIS — E7800 Pure hypercholesterolemia, unspecified: Secondary | ICD-10-CM

## 2011-05-30 DIAGNOSIS — J441 Chronic obstructive pulmonary disease with (acute) exacerbation: Secondary | ICD-10-CM | POA: Insufficient documentation

## 2011-05-30 DIAGNOSIS — J449 Chronic obstructive pulmonary disease, unspecified: Secondary | ICD-10-CM | POA: Insufficient documentation

## 2011-05-30 DIAGNOSIS — I2510 Atherosclerotic heart disease of native coronary artery without angina pectoris: Secondary | ICD-10-CM

## 2011-05-30 DIAGNOSIS — Z9011 Acquired absence of right breast and nipple: Secondary | ICD-10-CM

## 2011-05-30 DIAGNOSIS — Z1231 Encounter for screening mammogram for malignant neoplasm of breast: Secondary | ICD-10-CM

## 2011-05-30 DIAGNOSIS — F17200 Nicotine dependence, unspecified, uncomplicated: Secondary | ICD-10-CM | POA: Insufficient documentation

## 2011-05-30 DIAGNOSIS — R079 Chest pain, unspecified: Secondary | ICD-10-CM

## 2011-05-30 DIAGNOSIS — Z794 Long term (current) use of insulin: Secondary | ICD-10-CM | POA: Insufficient documentation

## 2011-05-30 DIAGNOSIS — E785 Hyperlipidemia, unspecified: Secondary | ICD-10-CM | POA: Insufficient documentation

## 2011-05-30 LAB — HEPATIC FUNCTION PANEL
ALT: 10 U/L (ref 0–35)
AST: 17 U/L (ref 0–37)
Bilirubin, Direct: 0.1 mg/dL (ref 0.0–0.3)
Total Bilirubin: 0.6 mg/dL (ref 0.3–1.2)
Total Protein: 7.1 g/dL (ref 6.0–8.3)

## 2011-05-30 LAB — LIPID PANEL
Cholesterol: 164 mg/dL (ref 0–200)
LDL Cholesterol: 90 mg/dL (ref 0–99)
Total CHOL/HDL Ratio: 3
Triglycerides: 86 mg/dL (ref 0.0–149.0)

## 2011-05-30 LAB — BASIC METABOLIC PANEL
CO2: 30 mEq/L (ref 19–32)
Chloride: 97 mEq/L (ref 96–112)
Potassium: 4 mEq/L (ref 3.5–5.1)

## 2011-05-30 MED ORDER — NITROGLYCERIN 0.4 MG SL SUBL: 0.4000 mg | SUBLINGUAL_TABLET | SUBLINGUAL | Status: DC | PRN

## 2011-05-30 NOTE — Assessment & Plan Note (Signed)
Chemistries today are normal. Her lipid parameters look fairly good. We will continue with her current medical therapy. Encouraged a heart healthy diet.

## 2011-05-30 NOTE — Assessment & Plan Note (Signed)
We discussed smoking cessation strategies for 5-10 minutes. She reports that her husband is willing to quit if she does. She feels that she is under too much stress to quit at this time.

## 2011-05-30 NOTE — Assessment & Plan Note (Signed)
Blood pressure control is good on her current therapy.

## 2011-05-30 NOTE — Progress Notes (Signed)
[name] Date of Birth: Jan 07, 1946   History of Present Illness: [name] is seen today for yearly followup. She has a history of coronary disease and has had multiple angioplasty procedures of the first obtuse marginal vessel. She is status post stenting of the right coronary using a 3.0 x 18 mm Tetra stent in 2000. Her last cardiac catheterization in July of 2010 showed nonobstructive disease. She has been hospitalized numerous times over the past year for hip problems. She underwent bilateral total hip replacements last December. She then developed a severe hematoma in her right hip that required evacuation. Her hip then dislocated and she had to have revision of her right hip in May of 2012. She still has significant back pain from severe osteoarthritis. She continues to smoke. This past Thursday while driving she developed an episode of severe chest pain. As is described as a sharp midsternal pain associated with shortness of breath. She took 4 sublingual nitroglycerin and I nerve pill and her pain resolved in about 30 minutes. Yesterday again she was driving at the end of the day and had similar pain. She has had some chronic swelling in her feet which has improved recently.  Current Outpatient Prescriptions on File Prior to Visit  Medication Sig Dispense Refill  . albuterol (PROVENTIL) (2.5 MG/3ML) 0.083% nebulizer solution Take 2.5 mg by nebulization every 6 (six) hours as needed.        . ALPRAZolam (XANAX) 0.25 MG tablet Take one tablet daily or as needed  90 tablet  3  . aspirin 81 MG tablet Take 325 mg by mouth daily.       Marland Kitchen BIOTIN PO Take by mouth.        . diltiazem (CARDIZEM CD) 180 MG 24 hr capsule TAKE ONE CAPSULE EVERY DAY  90 capsule  3  . furosemide (LASIX) 40 MG tablet TAKE 1 TABLET EVERY DAY  90 tablet  3  . gabapentin (NEURONTIN) 600 MG tablet Take 600 mg by mouth daily.       . insulin glargine (LANTUS) 100 UNIT/ML injection Inject 100 Units into the skin at bedtime.         Marland Kitchen losartan (COZAAR) 25 MG tablet TAKE 1 TABLET EVERY DAY  90 tablet  3  . metFORMIN (GLUCOPHAGE) 500 MG tablet Take 500 mg by mouth 2 (two) times daily with a meal.        . metoprolol (LOPRESSOR) 100 MG tablet TAKE 1 TABLET EVERY DAY  90 tablet  3  . morphine (KADIAN) 30 MG 24 hr capsule Take 30 mg by mouth daily.        . potassium chloride (KLOR-CON) 10 MEQ CR tablet Take 10 mEq by mouth daily.        . simvastatin (ZOCOR) 10 MG tablet Take 1 tablet (10 mg total) by mouth every evening.  90 tablet  3  . TRILIPIX 135 MG capsule TAKE ONE CAPSULE AT BEDTIME  90 capsule  3  . DISCONTD: nitroGLYCERIN (NITROSTAT) 0.4 MG SL tablet Place 0.4 mg under the tongue as needed.          Allergies  Allergen Reactions  . Penicillins     Past Medical History  Diagnosis Date  . Heart attack 2000  . Diabetes mellitus   . COPD (chronic obstructive pulmonary disease)   . Tobacco dependence   . Hyperlipidemia   . Breast cancer   . OA (osteoarthritis of spine)   . Hypertension   .  Coronary artery disease   . Asthma     Past Surgical History  Procedure Date  . Breast surgery 1991    right mastectomy  . Vulvectomy 1981    partial  . Neck fusion   . Pelvic laparoscopy 2002    RSO  . Cesarean section '78, '80, '81    x 3  . Hysteroscopy     D & C  . Cardiac catheterization 04/05/2009    EF 60%  . Lobectomy   . Tonsillectomy and adenoidectomy   . Mastectomy   . Coronary angioplasty 08/1998    x2 OF A BIFURCATION OM-1, OM-2 LESION  . Coronary angioplasty with stent placement 01/1999    MID FIRST OBTUSE MARGINAL VESSEL  . Coronary angioplasty with stent placement 07/1999    STENTING AT THE CRUX OF THE RIGHT CORONARY ARTERY WITH A 3.8MM X TETRA STENT  . Cardiovascular stress test 01/31/2005    EF 58%    History  Smoking status  . Current Everyday Smoker  Smokeless tobacco  . Not on file    History  Alcohol Use  . Yes    Family History  Problem Relation Age of Onset    . Diabetes Mother   . Hypertension Father   . Heart disease Father   . Heart attack Father   . Stroke Father     Review of Systems: The review of systems is positive for right hip discomfort. She has chronic back pain. She has a chronic cough and dyspnea related to COPD. She uses an inhaler fairly frequently. All other systems were reviewed and are negative.  Physical Exam: BP 126/78  Pulse 68  Ht 5\' 5"  (1.651 m)  Wt 163 lb 12.8 oz (74.299 kg)  BMI 27.26 kg/m2 She is a pleasant white female who appears much older than her stated age. She is normocephalic, atraumatic. Pupils are equal round and reactive to light and accommodation. Oropharynx is clear. Neck is supple without JVD, adenopathy, thyromegaly, or bruits. Lungs reveal scattered rhonchi which clear somewhat with coughing. Cardiac exam reveals a regular rate and rhythm without significant gallop, murmur, or click. Abdomen is obese and nontender. Her pedal pulses are good. She has 1+ edema. Skin is warm and dry. She is alert and oriented x3. Cranial nerves II through XII are intact. LABORATORY DATA: ECG today demonstrates normal sinus rhythm with T-wave inversions in leads 3, aVF, and V1 through V4. These changes are actually less pronounced compared to her old ECG.  Assessment / Plan:

## 2011-05-30 NOTE — Patient Instructions (Signed)
We will schedule you for a Lexiscan cardiolite (nuclear stress test)  Continue your current medications.  We will call with the results of your lab work.  Stop smoking.

## 2011-05-30 NOTE — Assessment & Plan Note (Signed)
She has experienced 2 episodes of chest pain recently with atypical features. Her ECG today shows no acute changes. It has been 2 years since her cardiac catheterization which showed nonobstructive disease. She does have a history of multiple interventions in the past. We'll schedule her for a Lexiscan Myoview study to further evaluate her discomfort.

## 2011-06-03 ENCOUNTER — Encounter: Payer: Self-pay | Admitting: Cardiology

## 2011-06-19 ENCOUNTER — Telehealth: Payer: Self-pay | Admitting: *Deleted

## 2011-06-19 ENCOUNTER — Telehealth: Payer: Self-pay | Admitting: Cardiology

## 2011-06-19 NOTE — Telephone Encounter (Signed)
Dr. Zachery Dauer spoke w/Dr. [place] today re: abn EKG. Ms. Stolar is in her office c/o chest pain. When seen by [place] 9/14 she was to call back to schedule myoview stress but she has not called. Dr. Swaziland advised Dr. Zachery Dauer to tell her to call back for [place] to schedule Myoview.

## 2011-06-19 NOTE — Telephone Encounter (Signed)
Pt is calling about stress test

## 2011-06-20 ENCOUNTER — Telehealth: Payer: Self-pay | Admitting: *Deleted

## 2011-06-20 NOTE — Telephone Encounter (Signed)
Called wanting to schedule stress test, no order in system,  please call as soon as possible.

## 2011-06-23 ENCOUNTER — Telehealth: Payer: Self-pay | Admitting: Cardiology

## 2011-06-23 NOTE — Telephone Encounter (Signed)
Was seen in Dr. Tommas Olp office Friday w/Chest pain. Dr. Zachery Dauer sent EKG over for Dr. [place] to evaluate. He spoke w/Dr. Zachery Dauer and was decided that she needs to get stress test. Lm on Friday for Ms.  Maule to call. She called back today and will schedule.

## 2011-06-23 NOTE — Telephone Encounter (Signed)
Pt returning your call

## 2011-06-24 ENCOUNTER — Other Ambulatory Visit (HOSPITAL_COMMUNITY): Payer: Managed Care, Other (non HMO) | Admitting: Radiology

## 2011-06-24 ENCOUNTER — Ambulatory Visit (HOSPITAL_COMMUNITY): Payer: Managed Care, Other (non HMO) | Attending: Cardiology | Admitting: Radiology

## 2011-06-24 DIAGNOSIS — R079 Chest pain, unspecified: Secondary | ICD-10-CM | POA: Insufficient documentation

## 2011-06-24 DIAGNOSIS — E119 Type 2 diabetes mellitus without complications: Secondary | ICD-10-CM

## 2011-06-24 DIAGNOSIS — I2510 Atherosclerotic heart disease of native coronary artery without angina pectoris: Secondary | ICD-10-CM

## 2011-06-24 DIAGNOSIS — R0609 Other forms of dyspnea: Secondary | ICD-10-CM

## 2011-06-24 MED ORDER — TECHNETIUM TC 99M TETROFOSMIN IV KIT
11.0000 | PACK | Freq: Once | INTRAVENOUS | Status: AC | PRN
  Administered 2011-06-24: 11 via INTRAVENOUS

## 2011-06-24 MED ORDER — REGADENOSON 0.4 MG/5ML IV SOLN
0.4000 mg | Freq: Once | INTRAVENOUS | Status: AC
  Administered 2011-06-24: 0.4 mg via INTRAVENOUS

## 2011-06-24 MED ORDER — TECHNETIUM TC 99M TETROFOSMIN IV KIT
33.0000 | PACK | Freq: Once | INTRAVENOUS | Status: AC | PRN
  Administered 2011-06-24: 33 via INTRAVENOUS

## 2011-06-24 NOTE — Progress Notes (Signed)
[facility] SITE 3 NUCLEAR MED [address] [place] [address] [phone]  Cardiology Nuclear Med Study  [name] is a 65 y.o. female [id number] 04-10-1946   Nuclear Med Background Indication for Stress Test:  Evaluation for Ischemia, PTCA/Stent Patency  History:  COPD, Emphysema and '99 PTCA-OM; '00 MI>Stent-RCA; '06 GEX:BMWUXL, EF=58%; '10 Cath:Patent PCI's with N/O CAD, EF=60% Cardiac Risk Factors: Family History - CAD, Hypertension, IDDM Type 2, Lipids, Smoker and TIA  Symptoms:  Chest Pain (last episode of chest discomfort was last week), Diaphoresis, DOE?SOB, Palpitations, Rapid HR    Nuclear Pre-Procedure Caffeine/Decaff Intake:  None NPO After: 9:00pm   Lungs:  Clear.  O2 SAT 95% on RA. IV 0.9% NS with Angio Cath:  20g  IV Site: L Antecubital x 1, tolerated well IV Started by:  Irean Hong, RN  Chest Size (in):  44 Cup Size: C  Height: 5\' 5"  (1.651 m)  Weight:  169 lb (76.658 kg)  BMI:  Body mass index is 28.12 kg/(m^2). Tech Comments:  Metoprolol held this a.m.m.  No insulin or metformin today    Nuclear Med Study 1 or 2 day study: 1 day  Stress Test Type:  Eugenie Birks  Reading MD: Arvilla Meres, MD  Order Authorizing Provider:  Peter [place], MD  Resting Radionuclide: Technetium 66m Tetrofosmin  Resting Radionuclide Dose: 11 mCi   Stress Radionuclide:  Technetium 76m Tetrofosmin  Stress Radionuclide Dose: 33 mCi           Stress Protocol Rest HR: 82 Stress HR: 109  Rest BP: 129/77 Stress BP: 145/73  Exercise Time (min): n/a METS: n/a   Predicted Max HR: 157 bpm % Max HR: 69.43 bpm Rate Pressure Product: 24401   Dose of Adenosine (mg):  n/a Dose of Lexiscan: 0.4 mg  Dose of Atropine (mg): n/a Dose of Dobutamine: n/a mcg/kg/min (at max HR)  Stress Test Technologist: Smiley Houseman, CMA-N  Nuclear Technologist:  Domenic Polite, CNMT     Rest Procedure:  Myocardial perfusion imaging was performed at rest 45 minutes following  the intravenous administration of Technetium 63m Tetrofosmin.  Rest ECG: Nonspecific ST-T wave changes.  Stress Procedure:  The patient received IV Lexiscan 0.4 mg over 15-seconds.  Technetium 65m Tetrofosmin injected at 30-seconds.  There were more diffuse T-wave changes with Lexiscan.  She did c/o chest pressure with infusion.  Quantitative spect images were obtained after a 45 minute delay.  Stress ECG: No significant change from baseline ECG  QPS Raw Data Images:  Normal; no motion artifact; normal heart/lung ratio. Stress Images:  Normal homogeneous uptake in all areas of the myocardium. Rest Images:  Normal homogeneous uptake in all areas of the myocardium. Subtraction (SDS):  Normal Transient Ischemic Dilatation (Normal <1.22):  1.04 Lung/Heart Ratio (Normal <0.45):  .31  Quantitative Gated Spect Images QGS EDV:  90 ml QGS ESV:  27 ml QGS cine images:  NL LV Function; NL Wall Motion QGS EF: 69%  Impression Exercise Capacity:  Lexiscan with no exercise. BP Response:  n/a Clinical Symptoms:  n/a ECG Impression:  No significant ECG changes with Lexiscan. Comparison with Prior Nuclear Study: No images to compare  Overall Impression:  Normal stress nuclear study.    Arvilla Meres

## 2011-06-26 ENCOUNTER — Telehealth: Payer: Self-pay | Admitting: *Deleted

## 2011-06-26 NOTE — Telephone Encounter (Signed)
Lm that Myoview stress was nml. Will send to Dr. Zachery Dauer

## 2011-06-26 NOTE — Telephone Encounter (Signed)
Message copied by Lorayne Bender on Thu Jun 26, 2011 10:30 AM ------      Message from: [place], PETER M      Created: Wed Jun 25, 2011 12:32 PM       Normal nuclear study. Good news. Cc: Dr. Zachery Dauer.      Theron Arista [place]

## 2011-08-01 ENCOUNTER — Other Ambulatory Visit: Payer: Self-pay | Admitting: Family Medicine

## 2011-08-01 DIAGNOSIS — R918 Other nonspecific abnormal finding of lung field: Secondary | ICD-10-CM

## 2011-08-06 ENCOUNTER — Ambulatory Visit
Admission: RE | Admit: 2011-08-06 | Discharge: 2011-08-06 | Disposition: A | Discharge status: Home / Self Care | Payer: Managed Care, Other (non HMO) | Source: Ambulatory Visit | Attending: Family Medicine | Admitting: Family Medicine

## 2011-08-06 DIAGNOSIS — R918 Other nonspecific abnormal finding of lung field: Secondary | ICD-10-CM

## 2011-08-06 MED ORDER — IOHEXOL 300 MG/ML  SOLN
75.0000 mL | Freq: Once | INTRAMUSCULAR | Status: AC | PRN
  Administered 2011-08-06: 75 mL via INTRAVENOUS

## 2011-08-14 ENCOUNTER — Encounter: Payer: Self-pay | Admitting: Internal Medicine

## 2011-08-14 ENCOUNTER — Encounter: Payer: Self-pay | Admitting: Cardiology

## 2011-08-15 ENCOUNTER — Ambulatory Visit (INDEPENDENT_AMBULATORY_CARE_PROVIDER_SITE_OTHER): Payer: Managed Care, Other (non HMO) | Admitting: Internal Medicine

## 2011-08-15 ENCOUNTER — Encounter: Payer: Self-pay | Admitting: Internal Medicine

## 2011-08-15 DIAGNOSIS — I10 Essential (primary) hypertension: Secondary | ICD-10-CM

## 2011-08-15 DIAGNOSIS — F17200 Nicotine dependence, unspecified, uncomplicated: Secondary | ICD-10-CM

## 2011-08-15 DIAGNOSIS — J9819 Other pulmonary collapse: Secondary | ICD-10-CM

## 2011-08-15 DIAGNOSIS — J449 Chronic obstructive pulmonary disease, unspecified: Secondary | ICD-10-CM

## 2011-08-15 DIAGNOSIS — J9811 Atelectasis: Secondary | ICD-10-CM

## 2011-08-15 MED ORDER — BUDESONIDE-FORMOTEROL FUMARATE 160-4.5 MCG/ACT IN AERO: INHALATION_SPRAY | RESPIRATORY_TRACT | Status: DC

## 2011-08-15 MED ORDER — NEBIVOLOL HCL 20 MG PO TABS: ORAL_TABLET | ORAL | Status: DC

## 2011-08-15 NOTE — Progress Notes (Signed)
Subjective:    Patient ID: [name], female    DOB: Jul 19, 1946, 65 y.o.   MRN: [id number]  HPI  36 yowf smoker with dx copd underwent bilateral  hip surgery Dec 2011  and did so well able to do rehab at home and recovered x not able to lie flat because of chronic "wheezing" and hip pain  Then much worse since  06/2011 with persistent cough and congestion so referred to pulmonary clinic 08/15/2011  by Dr Narda Rutherford  08/15/2011 1st pulmonary eval in [facility] persisent"worse than usual congestion" with  cough x around 6 weeks abrupt onset/ persistent initially with chills,  Clear thick mucus assoc with sob to point where can't do steps without stopping at top started neb  budesonide but stopped duoneb.  rx with levaquin.  No overt sinus symptoms or HB or h/o asthma/ allergies - confused with names of meds and instructions re start/ stopping meds.  Not sure saba hfa helping  Also denies any obvious fluctuation of symptoms with weather or environmental changes or other aggravating or alleviating factors except as outlined above     Review of Systems  Constitutional: Positive for unexpected weight change. Negative for fever.  HENT: Negative for ear pain, nosebleeds, congestion, sore throat, rhinorrhea, sneezing, trouble swallowing, dental problem, postnasal drip and sinus pressure.   Eyes: Negative for redness and itching.  Respiratory: Positive for cough and shortness of breath. Negative for chest tightness and wheezing.   Cardiovascular: Positive for chest pain and palpitations. Negative for leg swelling.  Gastrointestinal: Negative for nausea and vomiting.  Genitourinary: Negative for dysuria.  Musculoskeletal: Negative for joint swelling.  Skin: Negative for rash.  Neurological: Negative for headaches.  Hematological: Does not bruise/bleed easily.  Psychiatric/Behavioral: Negative for dysphoric mood. The patient is not nervous/anxious.        Objective:   Physical Exam 08/15/2011   168  HEENT mild turbinate edema.  Oropharynx no thrush or excess pnd or cobblestoning.  No JVD or cervical adenopathy. Mild accessory muscle hypertrophy. Trachea midline, nl thryroid. Chest was hyperinflated by percussion with diminished breath sounds and moderate increased exp time without wheeze. Hoover sign positive at mid inspiration. Regular rate and rhythm without murmur gallop or rub or increase P2 or edema.  Abd: no hsm, nl excursion. Ext warm without cyanosis or clubbing.    Ct chest 08/06/11 Left lobe air space disease is associated with volume loss and  inspissated material within the left lower lobe bronchus. Findings  suggest possible aspiration pneumonia. No definite central lung  mass is demonstrated.  2. Solitary irregular 5 mm right upper lobe nodule was not clearly  demonstrated on the prior CT but may have been obscured by  multifocal ground-glass opacities.  3. No evidence metastatic breast cancer.      Assessment & Plan:

## 2011-08-15 NOTE — Patient Instructions (Addendum)
Symbicort 160 Take 2 puffs first thing in am and then another 2 puffs about 12 hours later.   If needed for short of breath ok to use the nebulizer at home and proventil and atrovent as needed out of the house up to every 4 hours  Work on inhaler technique:  relax and gently blow all the way out then take a nice smooth deep breath back in, triggering the inhaler at same time you start breathing in.  Hold for up to 5 seconds if you can.  Rinse and gargle with water when done   If your mouth or throat starts to bother you,   I suggest you time the inhaler to your dental care and after using the inhaler(s) brush teeth and tongue with a baking soda containing toothpaste and when you rinse this out, gargle with it first to see if this helps your mouth and throat.     Mucinex 600 mg 2 every 12 hours as needed for cough and congestion  Ok to stop budesonide   Stop lopressor and start bystolic 20 mg one daily and if too strong stop cardizem (option to cut the bystolic in half)   Please schedule a follow up office visit in 2 weeks, sooner if needed  With CXR

## 2011-08-16 DIAGNOSIS — J9811 Atelectasis: Secondary | ICD-10-CM | POA: Insufficient documentation

## 2011-08-16 HISTORY — PX: JOINT REPLACEMENT: SHX530

## 2011-08-16 NOTE — Assessment & Plan Note (Signed)
Strongly prefer in this setting: Bystolic, the most beta -1  selective Beta blocker available in sample form, with bisoprolol the most selective generic choice  on the market.  

## 2011-08-16 NOTE — Assessment & Plan Note (Addendum)
DDX of  difficult airways managment all start with A and  include Adherence, Ace Inhibitors, Acid Reflux, Active Sinus Disease, Alpha 1 Antitripsin deficiency, Anxiety masquerading as Airways dz,  ABPA,  allergy(esp in young), Aspiration (esp in elderly), Adverse effects of DPI,  Active smokers, plus two Bs  = Bronchiectasis and Beta blocker use..and one C= CHF   Adherence is always the initial "prime suspect" and is a multilayered concern that requires a "trust but verify" approach in every patient - starting with knowing how to use medications, especially inhalers, correctly, keeping up with refills and understanding the fundamental difference between maintenance and prns vs those medications only taken for a very short course and then stopped and not refilled. She really is struggling with relatively simple recs which I reviewed in writing   The proper method of use, as well as anticipated side effects, of this metered-dose inhaler are discussed and demonstrated to the patient. Improved to 75% with coaching  Active smoking a major concern, discussed separately.  ? Beta blocker an effect > may be an issue as she is on very high doses. Strongly prefer in this setting: Bystolic, the most beta -1  selective Beta blocker available in sample form, with bisoprolol the most selective generic choice  on the market.

## 2011-08-16 NOTE — Assessment & Plan Note (Signed)
Clearly also seen on plain cxr s def lung mass so ok to rx by treating the airway mechanics first with close f/u to r/o endobronchial tumor  Discussed in detail all the  indications, usual  risks and alternatives  relative to the benefits with patient who agrees to proceed with conservative f/u for now (placed in tickle file)

## 2011-08-16 NOTE — Assessment & Plan Note (Signed)
I emphasized that although we never turn away smokers from the pulmonary clinic, we do ask that they understand that the recommendations that we make  won't work nearly as well in the presence of continued cigarette exposure.  In fact, we may very well  reach a point where we can't promise to help the patient if he/she can't quit smoking. (We can and will promise to try to help, we just can't promise what we recommend will really work)  

## 2011-09-05 ENCOUNTER — Ambulatory Visit (INDEPENDENT_AMBULATORY_CARE_PROVIDER_SITE_OTHER): Payer: Managed Care, Other (non HMO) | Admitting: Internal Medicine

## 2011-09-05 ENCOUNTER — Ambulatory Visit (INDEPENDENT_AMBULATORY_CARE_PROVIDER_SITE_OTHER)
Admission: RE | Admit: 2011-09-05 | Discharge: 2011-09-05 | Disposition: A | Discharge status: Home / Self Care | Payer: Managed Care, Other (non HMO) | Source: Ambulatory Visit | Attending: Internal Medicine | Admitting: Internal Medicine

## 2011-09-05 ENCOUNTER — Encounter: Payer: Self-pay | Admitting: Internal Medicine

## 2011-09-05 VITALS — BP 120/70 | HR 64 | Temp 98.1°F | Ht 60.0 in | Wt 170.4 lb

## 2011-09-05 DIAGNOSIS — J9819 Other pulmonary collapse: Secondary | ICD-10-CM

## 2011-09-05 DIAGNOSIS — F17200 Nicotine dependence, unspecified, uncomplicated: Secondary | ICD-10-CM

## 2011-09-05 DIAGNOSIS — J9811 Atelectasis: Secondary | ICD-10-CM

## 2011-09-05 DIAGNOSIS — J449 Chronic obstructive pulmonary disease, unspecified: Secondary | ICD-10-CM

## 2011-09-05 DIAGNOSIS — I10 Essential (primary) hypertension: Secondary | ICD-10-CM

## 2011-09-05 MED ORDER — CLONIDINE HCL 0.2 MG PO TABS: 0.2000 mg | ORAL_TABLET | Freq: Two times a day (BID) | ORAL | Status: DC

## 2011-09-05 MED ORDER — BISOPROLOL FUMARATE 10 MG PO TABS: 10.0000 mg | ORAL_TABLET | Freq: Every day | ORAL | Status: DC

## 2011-09-05 NOTE — Progress Notes (Signed)
Patient ID: [name], female   DOB: [birth year], 65 y.o.   MRN: [id number]  Subjective:    Patient ID: [name], female    DOB: 1946-06-07, 65 y.o.   MRN: [id number]  HPI  66 yowf smoker with dx copd underwent bilateral  hip surgery Dec 2011  and did so well able to do rehab at home and recovered x not able to lie flat because of chronic "wheezing" and hip pain  Then much worse since  06/2011 with persistent cough and congestion so referred to pulmonary clinic 08/15/2011  by Dr Narda Rutherford  08/15/2011 1st pulmonary eval in [facility] persisent"worse than usual congestion" with  cough x around 6 weeks abrupt onset/ persistent initially with chills,  Clear thick mucus assoc with sob to point where can't do steps without stopping at top started neb  budesonide but stopped duoneb.  rx with levaquin.  No overt sinus symptoms or HB or h/o asthma/ allergies - confused with names of meds and instructions re start/ stopping meds.  Not sure saba hfa helping rec Symbicort 160 Take 2 puffs first thing in am and then another 2 puffs about 12 hours later.  If needed for short of breath ok to use the nebulizer at home and proventil and atrovent as needed out of the house up to every 4 hours Work on inhaler technique:    Mucinex 600 mg 2 every 12 hours as needed for cough and congestion Ok to stop budesonide  Stop lopressor and start bystolic 20 mg one daily and if too strong stop cardizem (option to cut the bystolic in half)   follow up office visit in 2 weeks, sooner if needed  With CXR    09/05/2011 f/u ov/Wert cc  Mucus white thinner, much better breathing, no purulent sputum not using any neb and rarely hfa saba daytime.  Sleeping ok without nocturnal  or early am exacerbation  of respiratory  c/o's or need for noct saba. Also denies any obvious fluctuation of symptoms with weather or environmental changes or other aggravating or alleviating factors except as outlined above   ROS  At present neg for   any significant sore throat, dysphagia, itching, sneezing,  nasal congestion or excess/ purulent secretions,  fever, chills, sweats, unintended wt loss, pleuritic or exertional cp, hempoptysis, orthopnea pnd or leg swelling.  Also denies presyncope, palpitations, heartburn, abdominal pain, nausea, vomiting, diarrhea  or change in bowel or urinary habits, dysuria,hematuria,  rash, arthralgias, visual complaints, headache, numbness weakness or ataxia.                  Objective:   Physical Exam 08/15/2011  168  > 09/05/2011  170 HEENT mild turbinate edema.  Oropharynx no thrush or excess pnd or cobblestoning.  No JVD or cervical adenopathy. Mild accessory muscle hypertrophy. Trachea midline, nl thryroid. Chest was hyperinflated by percussion with diminished breath sounds and moderate increased exp time without wheeze. Aeration L base appears good, no localized wheezes or rhonchi. Hoover sign positive at end inspiration. Regular rate and rhythm without murmur gallop or rub or increase P2 or edema.  Abd: no hsm, nl excursion. Ext warm without cyanosis or clubbing.    Ct chest 08/06/11 Left lobe air space disease is associated with volume loss and  inspissated material within the left lower lobe bronchus. Findings  suggest possible aspiration pneumonia. No definite central lung  mass is demonstrated.  2. Solitary irregular 5 mm right upper lobe nodule was  not clearly  demonstrated on the prior CT but may have been obscured by  multifocal ground-glass opacities.  3. No evidence metastatic breast cancer.  CXR  09/05/2011 :  Comparison: Chest CT 08/06/2011 and earlier.  Findings: Since 07/17/2011, bibasilar ventilation has significantly improved. There is minimal residual streaky opacity at the left base. Stable cardiomegaly and mediastinal contours. No pneumothorax, pulmonary edema, pleural effusion, or areas of worsening opacity. Postoperative changes to the right axilla and chest wall.  Stable visualized osseous structures.  IMPRESSION: Mild residual basilar opacity, otherwise significantly improved radiographically since 07/17/2011.       Assessment & Plan:

## 2011-09-05 NOTE — Patient Instructions (Signed)
Stop bystolic   Start clonidine 0.2 mg twice daily and see if you notice better pain control and less need for oxycodone  Bisoprolol 10 mg once daily  If strong stop the cardizem  Stop smoking before it stops you  Please schedule a follow up office visit in 4 weeks, sooner if needed with pft's

## 2011-09-06 NOTE — Assessment & Plan Note (Signed)
No localized rhonchi in LLL and almost complete resolution by plain film so likely this was a mucus plug

## 2011-09-06 NOTE — Assessment & Plan Note (Signed)
Complicated by acquired mucociliary dysfunction and recent LLL atx in setting of exacerbation, resolved  Using symbicort better, needs pft's next

## 2011-09-06 NOTE — Assessment & Plan Note (Signed)
COPD appears Better on more specific beta blocker but concerned re cost of bystolic so try combination of clonidine and bisoprolol, both generics

## 2011-09-06 NOTE — Assessment & Plan Note (Signed)
>   3 min devoted to effects of smoking  I reviewed the Flethcher curve with patient that basically indicates  if you quit smoking when your best day FEV1 is still well preserved (which I hope to prove is the case with her) it is highly unlikely you will progress to severe disease and informed the patient there was no medication on the market that has proven to change the curve or the likelihood of progression.  Therefore stopping smoking and maintaining abstinence is the most important aspect of care, not choice of inhalers or for that matter, doctors.

## 2011-09-17 NOTE — Progress Notes (Signed)
Quick Note:  Spoke with pt and notified of results per Dr. Wert. Pt verbalized understanding and denied any questions.  ______ 

## 2011-11-05 ENCOUNTER — Other Ambulatory Visit: Payer: Self-pay | Admitting: Physical Medicine and Rehabilitation

## 2011-11-05 DIAGNOSIS — E041 Nontoxic single thyroid nodule: Secondary | ICD-10-CM

## 2011-11-07 ENCOUNTER — Ambulatory Visit
Admission: RE | Admit: 2011-11-07 | Discharge: 2011-11-07 | Disposition: A | Discharge status: Home / Self Care | Payer: Managed Care, Other (non HMO) | Source: Ambulatory Visit | Attending: Physical Medicine and Rehabilitation | Admitting: Physical Medicine and Rehabilitation

## 2011-11-07 DIAGNOSIS — E041 Nontoxic single thyroid nodule: Secondary | ICD-10-CM

## 2012-01-14 HISTORY — PX: JOINT REPLACEMENT: SHX530

## 2012-01-20 ENCOUNTER — Other Ambulatory Visit: Payer: Self-pay | Admitting: *Deleted

## 2012-01-20 MED ORDER — SIMVASTATIN 10 MG PO TABS: 10.0000 mg | ORAL_TABLET | Freq: Every evening | ORAL | Status: DC

## 2012-04-09 ENCOUNTER — Other Ambulatory Visit: Payer: Self-pay | Admitting: *Deleted

## 2012-04-09 MED ORDER — DILTIAZEM HCL ER COATED BEADS 180 MG PO CP24: 180.0000 mg | ORAL_CAPSULE | Freq: Every day | ORAL | Status: DC

## 2012-06-22 ENCOUNTER — Other Ambulatory Visit: Payer: Self-pay

## 2012-06-22 MED ORDER — LOSARTAN POTASSIUM 25 MG PO TABS: 25.0000 mg | ORAL_TABLET | Freq: Every day | ORAL | Status: DC

## 2012-12-02 ENCOUNTER — Telehealth: Payer: Self-pay | Admitting: Internal Medicine

## 2012-12-02 NOTE — Telephone Encounter (Signed)
I did not call her We have not even seen her since 2012!!! ? Is she needing something?? LMTCB

## 2012-12-03 NOTE — Telephone Encounter (Signed)
I spoke with pt. She stated she needed nothing and she was fine. She stated she had VM to call leslie. But since we didn't call her then nothing was needed. Will sign off message

## 2014-07-12 ENCOUNTER — Ambulatory Visit (INDEPENDENT_AMBULATORY_CARE_PROVIDER_SITE_OTHER): Payer: BC Managed Care – PPO | Admitting: Cardiology

## 2014-07-12 ENCOUNTER — Ambulatory Visit: Payer: Managed Care, Other (non HMO) | Admitting: Cardiology

## 2014-07-12 ENCOUNTER — Encounter: Payer: Self-pay | Admitting: Cardiology

## 2014-07-12 VITALS — BP 106/64 | HR 59 | Ht 62.0 in | Wt 171.6 lb

## 2014-07-12 DIAGNOSIS — F17200 Nicotine dependence, unspecified, uncomplicated: Secondary | ICD-10-CM

## 2014-07-12 DIAGNOSIS — I10 Essential (primary) hypertension: Secondary | ICD-10-CM

## 2014-07-12 DIAGNOSIS — I25118 Atherosclerotic heart disease of native coronary artery with other forms of angina pectoris: Secondary | ICD-10-CM

## 2014-07-12 DIAGNOSIS — J449 Chronic obstructive pulmonary disease, unspecified: Secondary | ICD-10-CM

## 2014-07-12 DIAGNOSIS — R600 Localized edema: Secondary | ICD-10-CM | POA: Insufficient documentation

## 2014-07-12 DIAGNOSIS — R609 Edema, unspecified: Secondary | ICD-10-CM

## 2014-07-12 MED ORDER — CLONIDINE HCL 0.2 MG PO TABS: 0.1000 mg | ORAL_TABLET | Freq: Every day | ORAL | Status: DC

## 2014-07-12 MED ORDER — FUROSEMIDE 40 MG PO TABS: 40.0000 mg | ORAL_TABLET | Freq: Two times a day (BID) | ORAL | Status: DC

## 2014-07-12 NOTE — Progress Notes (Signed)
[name] Date of Birth: 1946/04/27   History of Present Illness: [name] is seen to reestablish cardiac care at the request of Dr. Modesto Charon. She was last seen in September 2012. She is a 68 yo WF with a history of coronary disease and has had multiple angioplasty procedures of the first obtuse marginal vessel. She is status post stenting of the right coronary using a 3.0 x 18 mm Tetra stent in 2000. Her last cardiac catheterization in July of 2010 showed nonobstructive disease. Her last myoview study in 2012 was normal. She has a history of tobacco and Etoh abuse. She has chronic depression.   Her major complaint today is of swelling in her legs. This began following complex bilateral hip surgery in 2012. It has progressed this past year. She is on lasix 40 mg daily. She wears support hose. She has worn Una boots and had ACE wraps as well with only transient improvement. She has chronic SOB. She does complain of chest pain monthly when she is under stress. She does complain of intermittent blacking out and falls. She has difficulty walking. She reports she is smoking one PPD and drinking 2 half gallons of hard liquor a week.   Current Outpatient Prescriptions on File Prior to Visit  Medication Sig Dispense Refill  . albuterol (PROVENTIL HFA;VENTOLIN HFA) 108 (90 BASE) MCG/ACT inhaler Inhale 2 puffs into the lungs every 6 (six) hours as needed.        Marland Kitchen albuterol (PROVENTIL) (2.5 MG/3ML) 0.083% nebulizer solution Take 2.5 mg by nebulization every 6 (six) hours as needed.        . ALPRAZolam (XANAX) 0.25 MG tablet Take one tablet daily or as needed  90 tablet  3  . BIOTIN PO Take 1 tablet by mouth daily.       Marland Kitchen gabapentin (NEURONTIN) 600 MG tablet Take 600 mg by mouth 3 (three) times daily.       . Ipratropium Bromide HFA (ATROVENT HFA IN) Inhale 17 mcg into the lungs. At bedtime      . losartan (COZAAR) 25 MG tablet Take 1 tablet (25 mg total) by mouth daily.  30 tablet  0  . morphine (KADIAN) 30  MG 24 hr capsule Take 30 mg by mouth daily.        . nicotine (NICODERM CQ - DOSED IN MG/24 HOURS) 21 mg/24hr patch Place 1 patch onto the skin daily.        . nitroGLYCERIN (NITROSTAT) 0.4 MG SL tablet Place 1 tablet (0.4 mg total) under the tongue as needed.  90 tablet  6  . oxyCODONE (ROXICODONE) 15 MG immediate release tablet Take 15 mg by mouth every 6 (six) hours as needed.        . potassium chloride (KLOR-CON) 10 MEQ CR tablet Take 10 mEq by mouth daily.        . bisoprolol (ZEBETA) 10 MG tablet Take 1 tablet (10 mg total) by mouth daily.  30 tablet  11  . simvastatin (ZOCOR) 10 MG tablet Take 1 tablet (10 mg total) by mouth every evening.  90 tablet  1   No current facility-administered medications on file prior to visit.    Allergies  Allergen Reactions  . Penicillins   . Zithromax [Azithromycin Dihydrate]     ORAL ULCERS     Past Medical History  Diagnosis Date  . Myocardial infarction 2000  . Diabetes mellitus type 2, insulin dependent   . COPD (chronic obstructive pulmonary disease)   .  Hyperlipidemia   . Breast cancer   . OA (osteoarthritis) of knee   . Hypertension   . Coronary artery disease   . Asthma   . Elevated LFTs   . ETOH abuse   . Tobacco abuse   . Osteoarthritis     Past Surgical History  Procedure Laterality Date  . Breast surgery  1991    right mastectomy  . Vulvectomy  1981    partial  . Neck fusion    . Pelvic laparoscopy  2002    RSO  . Cesarean section  '78, '80, '81    x 3  . Hysteroscopy      D & C  . Cardiac catheterization  04/05/2009    EF 60%  . Lobectomy    . Tonsillectomy and adenoidectomy    . Mastectomy    . Coronary angioplasty  08/1998    x2 OF A BIFURCATION OM-1, OM-2 LESION  . Coronary angioplasty with stent placement  01/1999    MID FIRST OBTUSE MARGINAL VESSEL  . Coronary angioplasty with stent placement  07/1999    STENTING AT THE CRUX OF THE RIGHT CORONARY ARTERY WITH A 3.8MM X TETRA STENT  .  Cardiovascular stress test  01/31/2005    EF 58%  . Total hip arthroplasty  08/2010    bilat    History  Smoking status  . Current Every Day Smoker -- 1.00 packs/day for 15 years  . Types: Cigarettes  Smokeless tobacco  . Never Used    History  Alcohol Use  . Yes    Comment: 1 gallon/week    Family History  Problem Relation Age of Onset  . Diabetes Mother   . Hypertension Father   . Heart disease Father   . Heart attack Father   . Stroke Father     Review of Systems: The review of systems is positive for  chronic back pain. She has  chronic  COPD. She is upset over being laid off at work.  All other systems were reviewed and are negative.  Physical Exam: BP 106/64  Pulse 59  Ht 5\' 2"  (1.575 m)  Wt 171 lb 9.6 oz (77.837 kg)  BMI 31.38 kg/m2 She is a depressed white female who appears much older than her stated age. She is normocephalic, atraumatic. Pupils are equal round and reactive to light and accommodation. Oropharynx is clear. Neck is supple without JVD, adenopathy, thyromegaly, or bruits. Lungs are clear.  Cardiac exam reveals a regular rate and rhythm without significant gallop, murmur, or click. Abdomen is obese and nontender. Her pedal pulses are good. She has 2-3+ edema to the upper thighs with support hose in place. Skin is warm and dry. She is alert and oriented x3. Cranial nerves II through XII are intact. Mood is depressed.  LABORATORY DATA: ECG today demonstrates normal sinus rhythm with ST-T wave changes consistent with inferior and anterior ischemia. These changes are increased from Sept. 2012.I have personally reviewed and interpreted this study.  Labs dated 06/23/14 reviewed and scanned into chart.  BNP 208 BUN 8. Creatinine 0.57. Electrolytes normal. ALP-338, AST-125, ALT 49 TSH 2.13 Chol-147, trig-234, HDL 43, LDL 57   Assessment / Plan: 1. Chronic lower extremity edema. Since this began following bilateral hip surgery this likely represents  chronic venous insufficiency. With her COPD and chronic tobacco abuse she may also have right heart failure with cor pulmonale. She has chronic Etoh abuse with elevated LFT so is at increased risk  of cirrhosis. Protein stores are adequate so she probably does not have nephrotic syndrome. I recommend increasing lasix to 40 mg bid. Sodium restriction. Continue support hose and elevation of feet when possible. May need intermittent ACE wraps when support hose fail. Will obtain Echo to assess LV/RV function and estimate pulmonary pressures.   2. CAD s/p multiple interventions as noted above. Current class 2 angina. Continue beta blocker and statin. Recommend she resume ASA 81 mg daily. Will update a Lexiscan myoview.  3. Tobacco abuse. Recommend cessation  4. Etoh abuse. Recommend cessation.   5. COPD  6. HTN. BP is low now. Since we are increasing lasix will reduce Clonidine to 0.1 mg daily. May need to adjust meds further.   7. Hyperlipidemia. Cholesterol under good control but elevated triglycerides. Needs to eat a healthier diet and lose weight.  We will follow up in 4 weeks.

## 2014-07-12 NOTE — Patient Instructions (Addendum)
Reduce clonidine to 0.1 mg daily  Increase lasix (furosemide) to 40 mg twice a day   You should take a baby ASA daily  We will schedule you for a nuclear stress test and an Echocardiogram  You need to quit smoking and quit drinking alcohol.

## 2014-07-20 ENCOUNTER — Other Ambulatory Visit: Payer: Self-pay | Admitting: Cardiology

## 2014-07-20 MED ORDER — NITROGLYCERIN 0.4 MG SL SUBL: 0.4000 mg | SUBLINGUAL_TABLET | SUBLINGUAL | Status: DC | PRN

## 2014-07-20 NOTE — Telephone Encounter (Signed)
Pt called in stating that she needs a new prescription for  NTG called in to the [facility] on [address]. Please call  Thanks

## 2014-07-24 ENCOUNTER — Ambulatory Visit: Payer: Managed Care, Other (non HMO) | Admitting: Cardiology

## 2014-07-25 ENCOUNTER — Telehealth (HOSPITAL_COMMUNITY): Payer: Self-pay

## 2014-07-25 NOTE — Telephone Encounter (Signed)
Encounter complete. 

## 2014-07-27 ENCOUNTER — Ambulatory Visit (HOSPITAL_BASED_OUTPATIENT_CLINIC_OR_DEPARTMENT_OTHER)
Admission: RE | Admit: 2014-07-27 | Discharge: 2014-07-27 | Disposition: A | Discharge status: Home / Self Care | Payer: BC Managed Care – PPO | Source: Ambulatory Visit | Attending: Cardiology | Admitting: Cardiology

## 2014-07-27 ENCOUNTER — Ambulatory Visit (HOSPITAL_COMMUNITY)
Admission: RE | Admit: 2014-07-27 | Discharge: 2014-07-27 | Disposition: A | Discharge status: Home / Self Care | Payer: BC Managed Care – PPO | Source: Ambulatory Visit | Attending: Cardiology | Admitting: Cardiology

## 2014-07-27 DIAGNOSIS — J45909 Unspecified asthma, uncomplicated: Secondary | ICD-10-CM | POA: Diagnosis not present

## 2014-07-27 DIAGNOSIS — E669 Obesity, unspecified: Secondary | ICD-10-CM | POA: Diagnosis not present

## 2014-07-27 DIAGNOSIS — F17210 Nicotine dependence, cigarettes, uncomplicated: Secondary | ICD-10-CM | POA: Diagnosis not present

## 2014-07-27 DIAGNOSIS — Z8673 Personal history of transient ischemic attack (TIA), and cerebral infarction without residual deficits: Secondary | ICD-10-CM | POA: Insufficient documentation

## 2014-07-27 DIAGNOSIS — I252 Old myocardial infarction: Secondary | ICD-10-CM | POA: Insufficient documentation

## 2014-07-27 DIAGNOSIS — J439 Emphysema, unspecified: Secondary | ICD-10-CM | POA: Insufficient documentation

## 2014-07-27 DIAGNOSIS — R42 Dizziness and giddiness: Secondary | ICD-10-CM | POA: Diagnosis present

## 2014-07-27 DIAGNOSIS — J449 Chronic obstructive pulmonary disease, unspecified: Secondary | ICD-10-CM | POA: Diagnosis not present

## 2014-07-27 DIAGNOSIS — F17200 Nicotine dependence, unspecified, uncomplicated: Secondary | ICD-10-CM

## 2014-07-27 DIAGNOSIS — E119 Type 2 diabetes mellitus without complications: Secondary | ICD-10-CM | POA: Insufficient documentation

## 2014-07-27 DIAGNOSIS — Z95818 Presence of other cardiac implants and grafts: Secondary | ICD-10-CM | POA: Insufficient documentation

## 2014-07-27 DIAGNOSIS — I10 Essential (primary) hypertension: Secondary | ICD-10-CM | POA: Diagnosis present

## 2014-07-27 DIAGNOSIS — R609 Edema, unspecified: Secondary | ICD-10-CM

## 2014-07-27 DIAGNOSIS — R600 Localized edema: Secondary | ICD-10-CM

## 2014-07-27 DIAGNOSIS — I059 Rheumatic mitral valve disease, unspecified: Secondary | ICD-10-CM

## 2014-07-27 DIAGNOSIS — I2510 Atherosclerotic heart disease of native coronary artery without angina pectoris: Secondary | ICD-10-CM | POA: Insufficient documentation

## 2014-07-27 DIAGNOSIS — R55 Syncope and collapse: Secondary | ICD-10-CM | POA: Diagnosis present

## 2014-07-27 DIAGNOSIS — Z794 Long term (current) use of insulin: Secondary | ICD-10-CM | POA: Insufficient documentation

## 2014-07-27 DIAGNOSIS — I25118 Atherosclerotic heart disease of native coronary artery with other forms of angina pectoris: Secondary | ICD-10-CM

## 2014-07-27 MED ORDER — TECHNETIUM TC 99M SESTAMIBI GENERIC - CARDIOLITE
30.0000 | Freq: Once | INTRAVENOUS | Status: AC | PRN
  Administered 2014-07-27: 30 via INTRAVENOUS

## 2014-07-27 MED ORDER — AMINOPHYLLINE 25 MG/ML IV SOLN
75.0000 mg | Freq: Once | INTRAVENOUS | Status: AC
  Administered 2014-07-27: 75 mg via INTRAVENOUS

## 2014-07-27 MED ORDER — REGADENOSON 0.4 MG/5ML IV SOLN
0.4000 mg | Freq: Once | INTRAVENOUS | Status: AC
  Administered 2014-07-27: 0.4 mg via INTRAVENOUS

## 2014-07-27 MED ORDER — TECHNETIUM TC 99M SESTAMIBI GENERIC - CARDIOLITE
10.0000 | Freq: Once | INTRAVENOUS | Status: AC | PRN
  Administered 2014-07-27: 10 via INTRAVENOUS

## 2014-07-27 NOTE — Progress Notes (Signed)
2D Echo Performed 07/27/2014    [name], RCS

## 2014-07-27 NOTE — Procedures (Addendum)
[facility] [address] [place] [address] [phone]  Cardiology Nuclear Med Study  [name] is a 68 y.o. female     MRN : [id number]     DOB: Jan 18, 1946  Procedure Date: 07/27/2014  Nuclear Med Background Indication for Stress Test:  Stent Patency, PTCA Patency and Abnormal EKG History:  Asthma, COPD, Emphysema and CAD;MI-2000;STENT/PTCA-1999 AND 2000;CATH IN 2010-NONOBSTRUCTIVE DISEASE;No prior NUC MPI for comparison. Cardiac Risk Factors: Family History - CAD, Hypertension, IDDM Type 2, Lipids, Obesity, Smoker and TIA  Symptoms:  Dizziness, DOE, Fatigue, Light-Headedness, Palpitations, SOB and Syncope   Nuclear Pre-Procedure Caffeine/Decaff Intake:  9:00pm NPO After: 7:00am   IV Site: L Forearm  IV 0.9% NS with Angio Cath:  22g  Chest Size (in):  n/a IV Started by: Rolene Course, RN  Height: 5\' 2"  (1.575 m)  Cup Size: C;Left breast only;Pt is s/p R mastectomy;NO BP and NO STICK in Right arm.  BMI:  Body mass index is 31.27 kg/(m^2). Weight:  171 lb (77.565 kg)   Tech Comments:  n/a    Nuclear Med Study 1 or 2 day study: 1 day  Stress Test Type:  Pat[place]  Order Authorizing Provider:  Peter Martinique, MD   Resting Radionuclide: Technetium 37m Sestamibi  Resting Radionuclide Dose: 10.3 mCi   Stress Radionuclide:  Technetium 52m Sestamibi  Stress Radionuclide Dose: 30.1 mCi           Stress Protocol Rest HR:61 Stress HR:76  Rest BP:112/60 Stress BP: 135/72  Exercise Time (min): n/a METS: n/a          Dose of Adenosine (mg):  n/a Dose of Lexiscan: 0.4 mg  Dose of Atropine (mg): n/a Dose of Dobutamine: n/a mcg/kg/min (at max HR)  Stress Test Technologist: Mellody Memos, CCT Nuclear Technologist: Otho Perl, CNMT   Rest Procedure:  Myocardial perfusion imaging was performed at rest 45 minutes following the intravenous administration of Technetium 45m Sestamibi. Stress Procedure:  The patient received  IV Lexiscan 0.4 mg over 15-seconds.  Technetium 64m Sestamibi injected IV at 30-seconds.  Patient experienced shortness of breath and was administered 75 mg of Aminophylline IV. There were no significant changes with Lexiscan.  Quantitative spect images were obtained after a 45 minute delay.  Transient Ischemic Dilatation (Normal <1.22):  0.96 QGS EDV:  76 ml QGS ESV:  26 ml LV Ejection Fraction: 66%        Rest ECG: NSR, inferolateral TWI, prolonged QT.  Stress ECG: Uninteretable due to baseline changes  QPS Raw Data Images:  There is interference from nuclear activity from structures below the diaphragm. This does not affect the ability to read the study. Stress Images:  Normal homogeneous uptake in all areas of the myocardium. Rest Images:  Normal homogeneous uptake in all areas of the myocardium. Subtraction (SDS):  No evidence of ischemia.  Impression Exercise Capacity:  Lexiscan with no exercise. BP Response:  Normal blood pressure response. Clinical Symptoms:  There is dyspnea. ECG Impression:  Uninterpretable due to baseline changes. Comparison with Prior Nuclear Study: Compared to 06/24/11, no significant change.  Overall Impression:  Normal stress nuclear study.  LV Wall Motion:  NL LV Function; NL Wall Motion   Kirk Ruths, MD  07/27/2014 12:14 PM

## 2014-08-07 ENCOUNTER — Encounter: Payer: Self-pay | Admitting: Cardiology

## 2014-08-07 ENCOUNTER — Ambulatory Visit (INDEPENDENT_AMBULATORY_CARE_PROVIDER_SITE_OTHER): Payer: BC Managed Care – PPO | Admitting: Cardiology

## 2014-08-07 VITALS — BP 97/70 | HR 83 | Ht 62.0 in | Wt 150.9 lb

## 2014-08-07 DIAGNOSIS — F1010 Alcohol abuse, uncomplicated: Secondary | ICD-10-CM | POA: Insufficient documentation

## 2014-08-07 DIAGNOSIS — I2510 Atherosclerotic heart disease of native coronary artery without angina pectoris: Secondary | ICD-10-CM

## 2014-08-07 DIAGNOSIS — R609 Edema, unspecified: Secondary | ICD-10-CM

## 2014-08-07 DIAGNOSIS — J449 Chronic obstructive pulmonary disease, unspecified: Secondary | ICD-10-CM

## 2014-08-07 DIAGNOSIS — I10 Essential (primary) hypertension: Secondary | ICD-10-CM

## 2014-08-07 DIAGNOSIS — F17200 Nicotine dependence, unspecified, uncomplicated: Secondary | ICD-10-CM

## 2014-08-07 DIAGNOSIS — R600 Localized edema: Secondary | ICD-10-CM

## 2014-08-07 MED ORDER — METOLAZONE 2.5 MG PO TABS: 2.5000 mg | ORAL_TABLET | Freq: Every day | ORAL | Status: DC

## 2014-08-07 NOTE — Assessment & Plan Note (Signed)
Coronary disease with multiple interventions. Class II angina though no current pain her Lexiscan Myoview was negative for ischemia and normal LV function.

## 2014-08-07 NOTE — Patient Instructions (Signed)
DECREASE Zebeta to 5mg  daily.  If BP stays below 105/70 then stop.  STOP Clonidine.  Add Metalozone 2.5mg  30 minute prior to Furosemide in the morning x 4 days then take on Mondays/Wednesdays/Fridays only.  INCREASE Potassium to 24meq on the days you take Metalozone.  Your physician recommends that you schedule a follow-up appointment in: Rachel Kicks, NP in December after seeing Dr. Jacelyn Grip.

## 2014-08-07 NOTE — Assessment & Plan Note (Signed)
Have asked her to stop drinking concerned this edema is related to her drinking.

## 2014-08-07 NOTE — Assessment & Plan Note (Signed)
Patient is hypotensive currently I am stopping her clonidine, and decreased zebeta to 5 mg daily.

## 2014-08-07 NOTE — Assessment & Plan Note (Signed)
As previously she's been instructed to stop smoking.

## 2014-08-07 NOTE — Assessment & Plan Note (Signed)
Wheezes on exam today otherwise stable.

## 2014-08-07 NOTE — Assessment & Plan Note (Addendum)
Chronic and increasing, Lasix may have helped some at twice a day. She does not stick to a low salt diet which she admits to again asked to decrease her salt intake in the meantime I'll add Zaroxolyn 2.5 mg prior to her morning Lasix for 4 days to see if this helps or edema.  Concerned this is related to possible cirrhosis. She'll follow-up with her primary care for further instructions on the edema. She is wearing her support stockings today

## 2014-08-07 NOTE — Progress Notes (Signed)
08/07/2014   PCP: Anthoney Harada, MD   Chief Complaint  Patient presents with  . Follow-up    pt denies chest pain and sob. pt states that she does experience swelling in her hands and feet.    Primary Cardiologist:Dr. P. Martinique   HPI:  68 yo WF with a history of coronary disease and has had multiple angioplasty procedures of the first obtuse marginal vessel. She is status post stenting of the right coronary using a 3.0 x 18 mm Tetra stent in 2000. Her last cardiac catheterization in July of 2010 showed nonobstructive disease. Her myoview study in 2012 was normal. She has a history of tobacco and Etoh abuse. She has chronic depression.  Her major complaint in OCt.was of swelling in her legs. This began following complex bilateral hip surgery in 2012. It has progressed this past year. She is on lasix 40 mg daily. She wears support hose. She has worn Una boots and had ACE wraps as well with only transient improvement. She has chronic SOB. She does complain of chest pain monthly when she is under stress. She does complain of intermittent blacking out and falls. She has difficulty walking. She reports she is smoking one PPD and drinking 2 half gallons of hard liquor a week.   Dr. P. Martinique increased her dose of lasix decreased her clonidine and today her BP is low at 97 systolic.  She continues with swelling.  Her echo was normal with EF of 65-70% and her nuc study was normal.   She is to see her PCP in a week.      Allergies  Allergen Reactions  . Penicillins   . Zithromax [Azithromycin Dihydrate]     ORAL ULCERS     Current Outpatient Prescriptions  Medication Sig Dispense Refill  . albuterol (PROVENTIL HFA;VENTOLIN HFA) 108 (90 BASE) MCG/ACT inhaler Inhale 2 puffs into the lungs every 6 (six) hours as needed.      Marland Kitchen albuterol (PROVENTIL) (2.5 MG/3ML) 0.083% nebulizer solution Take 2.5 mg by nebulization every 6 (six) hours as needed.      . ALPRAZolam  (XANAX) 0.25 MG tablet Take one tablet daily or as needed 90 tablet 3  . BIOTIN PO Take 1 tablet by mouth daily.     . bisoprolol (ZEBETA) 10 MG tablet Take 5 mg by mouth daily.  0  . diclofenac (VOLTAREN) 75 MG EC tablet Take 1 tablet by mouth 2 (two) times daily as needed.     . furosemide (LASIX) 40 MG tablet Take 1 tablet (40 mg total) by mouth 2 (two) times daily. 180 tablet 3  . gabapentin (NEURONTIN) 600 MG tablet Take 600 mg by mouth 3 (three) times daily.     . Ipratropium Bromide HFA (ATROVENT HFA IN) Inhale 17 mcg into the lungs. At bedtime    . JANUVIA 25 MG tablet Take 1 tablet by mouth daily.    Marland Kitchen losartan (COZAAR) 25 MG tablet Take 1 tablet (25 mg total) by mouth daily. 30 tablet 0  . morphine (KADIAN) 30 MG 24 hr capsule Take 30 mg by mouth daily.      Marland Kitchen morphine (MS CONTIN) 30 MG 12 hr tablet   0  . nicotine (NICODERM CQ - DOSED IN MG/24 HOURS) 21 mg/24hr patch Place 1 patch onto the skin daily.      . nitroGLYCERIN (NITROSTAT) 0.4 MG SL tablet Place 1 tablet (0.4 mg total) under the tongue  as needed. 90 tablet 6  . oxyCODONE (ROXICODONE) 15 MG immediate release tablet Take 15 mg by mouth every 6 (six) hours as needed.      . potassium chloride (KLOR-CON) 10 MEQ CR tablet Take 10-20 mEq by mouth daily. Take 26meq on the days you take Metolazone.    . metolazone (ZAROXOLYN) 2.5 MG tablet Take 1 tablet (2.5 mg total) by mouth daily. 30 tablet 5  . simvastatin (ZOCOR) 10 MG tablet Take 1 tablet (10 mg total) by mouth every evening. 90 tablet 1   No current facility-administered medications for this visit.    Past Medical History  Diagnosis Date  . Myocardial infarction 2000  . Diabetes mellitus type 2, insulin dependent   . COPD (chronic obstructive pulmonary disease)   . Hyperlipidemia   . Breast cancer   . OA (osteoarthritis) of knee   . Hypertension   . Coronary artery disease   . Asthma   . Elevated LFTs   . ETOH abuse   . Tobacco abuse   . Osteoarthritis      Past Surgical History  Procedure Laterality Date  . Breast surgery  1991    right mastectomy  . Vulvectomy  1981    partial  . Neck fusion    . Pelvic laparoscopy  2002    RSO  . Cesarean section  '78, '80, '81    x 3  . Hysteroscopy      D & C  . Cardiac catheterization  04/05/2009    EF 60%  . Lobectomy    . Tonsillectomy and adenoidectomy    . Mastectomy    . Coronary angioplasty  08/1998    x2 OF A BIFURCATION OM-1, OM-2 LESION  . Coronary angioplasty with stent placement  01/1999    MID FIRST OBTUSE MARGINAL VESSEL  . Coronary angioplasty with stent placement  07/1999    STENTING AT THE CRUX OF THE RIGHT CORONARY ARTERY WITH A 3.8MM X 18MM TETRA STENT  . Cardiovascular stress test  01/31/2005    EF 58%  . Total hip arthroplasty  08/2010    bilat    MAY:OKHTXHF:SF colds or fevers, + weight loss Skin:no rashes + ulcers and petechial bruising on her arms and legs HEENT:no blurred vision, no congestion CV:see HPI PUL:see HPI GI:no diarrhea constipation or melena, no indigestion GU:no hematuria, no dysuria MS:no joint pain, no claudication Neuro:no syncope, no lightheadedness Endo:+ diabetes -stable , no thyroid disease  Wt Readings from Last 3 Encounters:  08/07/14 150 lb 14.4 oz (68.448 kg)  07/27/14 171 lb (77.565 kg)  07/12/14 171 lb 9.6 oz (77.837 kg)    PHYSICAL EXAM BP 97/70 mmHg  Pulse 83  Ht 5\' 2"  (1.575 m)  Wt 150 lb 14.4 oz (68.448 kg)  BMI 27.59 kg/m2 General:Pleasant affect, NAD Skin:Warm and dry, brisk capillary refill HEENT:normocephalic, sclera clear, mucus membranes moist Neck:supple, no JVD, no bruits  Heart:S1S2 RRR without murmur, gallup, rub or click Lungs:without rales, rhonchi, Occ. wheezes SEL:TRVU, non tender, + BS, do not palpate liver spleen or masses Ext: 3+ lower ext edema,  2+ radial pulses Neuro:alert and oriented X 3, MAE, follows commands, + facial symmetry   ASSESSMENT AND PLAN Edema extremities Chronic and  increasing, Lasix may have helped some at twice a day. She does not stick to a low salt diet which she admits to again asked to decrease her salt intake in the meantime I'll add Zaroxolyn 2.5 mg prior to her morning Lasix  for 4 days to see if this helps or edema.  Concerned this is related to possible cirrhosis. She'll follow-up with her primary care for further instructions on the edema. She is wearing her support stockings today  Hypertension Patient is hypotensive currently I am stopping her clonidine, and decreased zebeta to 5 mg daily.    Coronary artery disease Coronary disease with multiple interventions. Class II angina though no current pain her Lexiscan Myoview was negative for ischemia and normal LV function.  COPD (chronic obstructive pulmonary disease) Wheezes on exam today otherwise stable.  ETOH abuse Have asked her to stop drinking concerned this edema is related to her drinking.  Tobacco dependence As previously she's been instructed to stop smoking.  pt to follow up in 3 weeks.    I would like to have had patient get a basic metabolic panel but she stated she recently had through  Dr. Jacelyn Grip we have called his office for labs.

## 2014-08-09 ENCOUNTER — Other Ambulatory Visit: Payer: Self-pay | Admitting: *Deleted

## 2014-08-09 MED ORDER — METOLAZONE 2.5 MG PO TABS: 2.5000 mg | ORAL_TABLET | Freq: Every day | ORAL | Status: DC

## 2014-08-16 ENCOUNTER — Telehealth: Payer: Self-pay | Admitting: Cardiology

## 2014-08-16 NOTE — Telephone Encounter (Signed)
Close encounter 

## 2014-08-26 ENCOUNTER — Emergency Department (HOSPITAL_COMMUNITY): Payer: BC Managed Care – PPO

## 2014-08-26 ENCOUNTER — Inpatient Hospital Stay (HOSPITAL_COMMUNITY)
Admission: EM | Admit: 2014-08-26 | Discharge: 2014-09-02 | DRG: 871 | Disposition: A | Discharge status: Home Health | Payer: BC Managed Care – PPO | Attending: Internal Medicine | Admitting: Internal Medicine

## 2014-08-26 ENCOUNTER — Encounter (HOSPITAL_COMMUNITY): Payer: Self-pay | Admitting: *Deleted

## 2014-08-26 DIAGNOSIS — N390 Urinary tract infection, site not specified: Secondary | ICD-10-CM | POA: Diagnosis present

## 2014-08-26 DIAGNOSIS — F1120 Opioid dependence, uncomplicated: Secondary | ICD-10-CM | POA: Diagnosis present

## 2014-08-26 DIAGNOSIS — J90 Pleural effusion, not elsewhere classified: Secondary | ICD-10-CM | POA: Diagnosis not present

## 2014-08-26 DIAGNOSIS — F329 Major depressive disorder, single episode, unspecified: Secondary | ICD-10-CM | POA: Diagnosis present

## 2014-08-26 DIAGNOSIS — B9620 Unspecified Escherichia coli [E. coli] as the cause of diseases classified elsewhere: Secondary | ICD-10-CM | POA: Diagnosis present

## 2014-08-26 DIAGNOSIS — I248 Other forms of acute ischemic heart disease: Secondary | ICD-10-CM | POA: Diagnosis present

## 2014-08-26 DIAGNOSIS — J189 Pneumonia, unspecified organism: Secondary | ICD-10-CM | POA: Diagnosis not present

## 2014-08-26 DIAGNOSIS — J811 Chronic pulmonary edema: Secondary | ICD-10-CM | POA: Diagnosis not present

## 2014-08-26 DIAGNOSIS — R6521 Severe sepsis with septic shock: Secondary | ICD-10-CM | POA: Diagnosis present

## 2014-08-26 DIAGNOSIS — Z853 Personal history of malignant neoplasm of breast: Secondary | ICD-10-CM

## 2014-08-26 DIAGNOSIS — E440 Moderate protein-calorie malnutrition: Secondary | ICD-10-CM | POA: Insufficient documentation

## 2014-08-26 DIAGNOSIS — R579 Shock, unspecified: Secondary | ICD-10-CM

## 2014-08-26 DIAGNOSIS — Z955 Presence of coronary angioplasty implant and graft: Secondary | ICD-10-CM | POA: Diagnosis not present

## 2014-08-26 DIAGNOSIS — I2510 Atherosclerotic heart disease of native coronary artery without angina pectoris: Secondary | ICD-10-CM | POA: Diagnosis present

## 2014-08-26 DIAGNOSIS — Z96643 Presence of artificial hip joint, bilateral: Secondary | ICD-10-CM | POA: Diagnosis present

## 2014-08-26 DIAGNOSIS — I5033 Acute on chronic diastolic (congestive) heart failure: Secondary | ICD-10-CM | POA: Diagnosis present

## 2014-08-26 DIAGNOSIS — Z6828 Body mass index (BMI) 28.0-28.9, adult: Secondary | ICD-10-CM | POA: Diagnosis not present

## 2014-08-26 DIAGNOSIS — Z794 Long term (current) use of insulin: Secondary | ICD-10-CM | POA: Diagnosis not present

## 2014-08-26 DIAGNOSIS — F17210 Nicotine dependence, cigarettes, uncomplicated: Secondary | ICD-10-CM | POA: Diagnosis present

## 2014-08-26 DIAGNOSIS — Z452 Encounter for adjustment and management of vascular access device: Secondary | ICD-10-CM | POA: Diagnosis not present

## 2014-08-26 DIAGNOSIS — R5383 Other fatigue: Secondary | ICD-10-CM | POA: Diagnosis not present

## 2014-08-26 DIAGNOSIS — E876 Hypokalemia: Secondary | ICD-10-CM | POA: Diagnosis present

## 2014-08-26 DIAGNOSIS — F1020 Alcohol dependence, uncomplicated: Secondary | ICD-10-CM | POA: Diagnosis present

## 2014-08-26 DIAGNOSIS — I4891 Unspecified atrial fibrillation: Secondary | ICD-10-CM

## 2014-08-26 DIAGNOSIS — R531 Weakness: Secondary | ICD-10-CM

## 2014-08-26 DIAGNOSIS — J9601 Acute respiratory failure with hypoxia: Secondary | ICD-10-CM | POA: Diagnosis present

## 2014-08-26 DIAGNOSIS — E785 Hyperlipidemia, unspecified: Secondary | ICD-10-CM | POA: Diagnosis present

## 2014-08-26 DIAGNOSIS — R918 Other nonspecific abnormal finding of lung field: Secondary | ICD-10-CM | POA: Diagnosis not present

## 2014-08-26 DIAGNOSIS — I517 Cardiomegaly: Secondary | ICD-10-CM | POA: Diagnosis not present

## 2014-08-26 DIAGNOSIS — D638 Anemia in other chronic diseases classified elsewhere: Secondary | ICD-10-CM | POA: Diagnosis present

## 2014-08-26 DIAGNOSIS — E1165 Type 2 diabetes mellitus with hyperglycemia: Secondary | ICD-10-CM | POA: Diagnosis present

## 2014-08-26 DIAGNOSIS — Z981 Arthrodesis status: Secondary | ICD-10-CM | POA: Diagnosis not present

## 2014-08-26 DIAGNOSIS — E873 Alkalosis: Secondary | ICD-10-CM | POA: Diagnosis present

## 2014-08-26 DIAGNOSIS — C50919 Malignant neoplasm of unspecified site of unspecified female breast: Secondary | ICD-10-CM | POA: Diagnosis not present

## 2014-08-26 DIAGNOSIS — Z7982 Long term (current) use of aspirin: Secondary | ICD-10-CM | POA: Diagnosis not present

## 2014-08-26 DIAGNOSIS — J9690 Respiratory failure, unspecified, unspecified whether with hypoxia or hypercapnia: Secondary | ICD-10-CM | POA: Diagnosis not present

## 2014-08-26 DIAGNOSIS — R29898 Other symptoms and signs involving the musculoskeletal system: Secondary | ICD-10-CM

## 2014-08-26 DIAGNOSIS — E119 Type 2 diabetes mellitus without complications: Secondary | ICD-10-CM

## 2014-08-26 DIAGNOSIS — E871 Hypo-osmolality and hyponatremia: Secondary | ICD-10-CM | POA: Diagnosis not present

## 2014-08-26 DIAGNOSIS — J449 Chronic obstructive pulmonary disease, unspecified: Secondary | ICD-10-CM | POA: Diagnosis present

## 2014-08-26 DIAGNOSIS — A419 Sepsis, unspecified organism: Secondary | ICD-10-CM

## 2014-08-26 DIAGNOSIS — I480 Paroxysmal atrial fibrillation: Secondary | ICD-10-CM | POA: Diagnosis present

## 2014-08-26 DIAGNOSIS — M6281 Muscle weakness (generalized): Secondary | ICD-10-CM | POA: Diagnosis not present

## 2014-08-26 DIAGNOSIS — N179 Acute kidney failure, unspecified: Secondary | ICD-10-CM | POA: Diagnosis not present

## 2014-08-26 DIAGNOSIS — G9340 Encephalopathy, unspecified: Secondary | ICD-10-CM

## 2014-08-26 DIAGNOSIS — G9341 Metabolic encephalopathy: Secondary | ICD-10-CM | POA: Diagnosis present

## 2014-08-26 DIAGNOSIS — I10 Essential (primary) hypertension: Secondary | ICD-10-CM | POA: Diagnosis present

## 2014-08-26 DIAGNOSIS — G8929 Other chronic pain: Secondary | ICD-10-CM | POA: Diagnosis present

## 2014-08-26 DIAGNOSIS — J9811 Atelectasis: Secondary | ICD-10-CM | POA: Diagnosis not present

## 2014-08-26 DIAGNOSIS — Z880 Allergy status to penicillin: Secondary | ICD-10-CM

## 2014-08-26 LAB — COMPREHENSIVE METABOLIC PANEL
ALBUMIN: 2.5 g/dL — AB (ref 3.5–5.2)
ALT: 28 U/L (ref 0–35)
ANION GAP: 14 (ref 5–15)
AST: 90 U/L — ABNORMAL HIGH (ref 0–37)
Alkaline Phosphatase: 368 U/L — ABNORMAL HIGH (ref 39–117)
BUN: 26 mg/dL — AB (ref 6–23)
CO2: 41 mEq/L (ref 19–32)
CREATININE: 1.82 mg/dL — AB (ref 0.50–1.10)
Calcium: 8.5 mg/dL (ref 8.4–10.5)
Chloride: 66 mEq/L — ABNORMAL LOW (ref 96–112)
GFR calc Af Amer: 32 mL/min — ABNORMAL LOW (ref >90)
GFR calc non Af Amer: 28 mL/min — ABNORMAL LOW (ref >90)
Glucose, Bld: 138 mg/dL — ABNORMAL HIGH (ref 70–99)
Sodium: 121 mEq/L — CL (ref 137–147)
Total Bilirubin: 0.7 mg/dL (ref 0.3–1.2)
Total Protein: 7 g/dL (ref 6.0–8.3)

## 2014-08-26 LAB — CBC WITH DIFFERENTIAL/PLATELET
BASOS ABS: 0 10*3/uL (ref 0.0–0.1)
BASOS PCT: 0 % (ref 0–1)
Eosinophils Absolute: 0.2 10*3/uL (ref 0.0–0.7)
Eosinophils Relative: 1 % (ref 0–5)
HCT: 31.1 % — ABNORMAL LOW (ref 36.0–46.0)
HEMOGLOBIN: 11.1 g/dL — AB (ref 12.0–15.0)
Lymphocytes Relative: 9 % — ABNORMAL LOW (ref 12–46)
Lymphs Abs: 1.3 10*3/uL (ref 0.7–4.0)
MCH: 36.2 pg — ABNORMAL HIGH (ref 26.0–34.0)
MCHC: 35.7 g/dL (ref 30.0–36.0)
MCV: 101.3 fL — ABNORMAL HIGH (ref 78.0–100.0)
MONO ABS: 1.1 10*3/uL — AB (ref 0.1–1.0)
Monocytes Relative: 8 % (ref 3–12)
NEUTROS ABS: 11.5 10*3/uL — AB (ref 1.7–7.7)
NEUTROS PCT: 82 % — AB (ref 43–77)
Platelets: 265 10*3/uL (ref 150–400)
RBC: 3.07 MIL/uL — ABNORMAL LOW (ref 3.87–5.11)
RDW: 13 % (ref 11.5–15.5)
WBC: 14.1 10*3/uL — ABNORMAL HIGH (ref 4.0–10.5)

## 2014-08-26 LAB — URINALYSIS, ROUTINE W REFLEX MICROSCOPIC
Bilirubin Urine: NEGATIVE
Glucose, UA: NEGATIVE mg/dL
Ketones, ur: NEGATIVE mg/dL
NITRITE: NEGATIVE
PROTEIN: NEGATIVE mg/dL
Specific Gravity, Urine: 1.007 (ref 1.005–1.030)
Urobilinogen, UA: 0.2 mg/dL (ref 0.0–1.0)
pH: 6.5 (ref 5.0–8.0)

## 2014-08-26 LAB — PROTIME-INR
INR: 0.94 (ref 0.00–1.49)
PROTHROMBIN TIME: 12.6 s (ref 11.6–15.2)

## 2014-08-26 LAB — URINE MICROSCOPIC-ADD ON

## 2014-08-26 LAB — MRSA PCR SCREENING: MRSA BY PCR: NEGATIVE

## 2014-08-26 LAB — MAGNESIUM: MAGNESIUM: 1.7 mg/dL (ref 1.5–2.5)

## 2014-08-26 MED ORDER — POTASSIUM CHLORIDE 10 MEQ/100ML IV SOLN
10.0000 meq | INTRAVENOUS | Status: AC
  Administered 2014-08-26 (×4): 10 meq via INTRAVENOUS
  Filled 2014-08-26 (×4): qty 100

## 2014-08-26 MED ORDER — MORPHINE SULFATE ER 30 MG PO CP24: 30.0000 mg | ORAL_CAPSULE | Freq: Every day | ORAL | Status: DC

## 2014-08-26 MED ORDER — SIMVASTATIN 10 MG PO TABS
10.0000 mg | ORAL_TABLET | Freq: Every evening | ORAL | Status: DC
  Administered 2014-08-26 – 2014-09-01 (×7): 10 mg via ORAL
  Filled 2014-08-26 (×8): qty 1

## 2014-08-26 MED ORDER — OXYCODONE HCL 5 MG PO TABS
15.0000 mg | ORAL_TABLET | Freq: Four times a day (QID) | ORAL | Status: DC | PRN
  Administered 2014-08-26: 15 mg via ORAL
  Filled 2014-08-26: qty 3

## 2014-08-26 MED ORDER — SODIUM CHLORIDE 0.9 % IJ SOLN
3.0000 mL | Freq: Two times a day (BID) | INTRAMUSCULAR | Status: DC
  Administered 2014-08-29: 22:00:00 via INTRAVENOUS
  Administered 2014-08-30: 3 mL via INTRAVENOUS
  Administered 2014-08-31: 11:00:00 via INTRAVENOUS
  Administered 2014-08-31 – 2014-09-01 (×2): 3 mL via INTRAVENOUS

## 2014-08-26 MED ORDER — POTASSIUM CHLORIDE IN NACL 20-0.9 MEQ/L-% IV SOLN
INTRAVENOUS | Status: DC
  Administered 2014-08-26: 23:00:00 via INTRAVENOUS
  Filled 2014-08-26: qty 1000

## 2014-08-26 MED ORDER — INSULIN ASPART 100 UNIT/ML ~~LOC~~ SOLN
0.0000 [IU] | Freq: Three times a day (TID) | SUBCUTANEOUS | Status: DC
  Administered 2014-08-27: 3 [IU] via SUBCUTANEOUS

## 2014-08-26 MED ORDER — IPRATROPIUM BROMIDE 0.02 % IN SOLN: 0.5000 mg | Freq: Four times a day (QID) | RESPIRATORY_TRACT | Status: DC | PRN

## 2014-08-26 MED ORDER — ALPRAZOLAM 0.25 MG PO TABS: 0.2500 mg | ORAL_TABLET | Freq: Every day | ORAL | Status: DC | PRN

## 2014-08-26 MED ORDER — SODIUM CHLORIDE 0.9 % IV SOLN
Freq: Once | INTRAVENOUS | Status: AC
  Administered 2014-08-26: 20:00:00 via INTRAVENOUS

## 2014-08-26 MED ORDER — BISOPROLOL FUMARATE 5 MG PO TABS
5.0000 mg | ORAL_TABLET | Freq: Every day | ORAL | Status: DC
  Filled 2014-08-26: qty 1

## 2014-08-26 MED ORDER — POTASSIUM CHLORIDE CRYS ER 20 MEQ PO TBCR
40.0000 meq | EXTENDED_RELEASE_TABLET | Freq: Once | ORAL | Status: DC
  Filled 2014-08-26: qty 2

## 2014-08-26 MED ORDER — LINAGLIPTIN 5 MG PO TABS
5.0000 mg | ORAL_TABLET | Freq: Every day | ORAL | Status: DC
  Filled 2014-08-26: qty 1

## 2014-08-26 MED ORDER — ALBUTEROL SULFATE (2.5 MG/3ML) 0.083% IN NEBU: 2.5000 mg | INHALATION_SOLUTION | Freq: Four times a day (QID) | RESPIRATORY_TRACT | Status: DC | PRN

## 2014-08-26 MED ORDER — POTASSIUM CHLORIDE IN NACL 20-0.9 MEQ/L-% IV SOLN
Freq: Once | INTRAVENOUS | Status: DC
  Filled 2014-08-26: qty 1000

## 2014-08-26 MED ORDER — INSULIN ASPART 100 UNIT/ML ~~LOC~~ SOLN: 0.0000 [IU] | Freq: Every day | SUBCUTANEOUS | Status: DC

## 2014-08-26 MED ORDER — ACETAMINOPHEN 650 MG RE SUPP
650.0000 mg | Freq: Four times a day (QID) | RECTAL | Status: DC | PRN
Start: 2014-08-26 — End: 2014-08-27

## 2014-08-26 MED ORDER — NITROGLYCERIN 0.4 MG SL SUBL: 0.4000 mg | SUBLINGUAL_TABLET | SUBLINGUAL | Status: DC | PRN

## 2014-08-26 MED ORDER — IPRATROPIUM BROMIDE HFA 17 MCG/ACT IN AERS: 2.0000 | INHALATION_SPRAY | Freq: Four times a day (QID) | RESPIRATORY_TRACT | Status: DC | PRN

## 2014-08-26 MED ORDER — ALBUTEROL SULFATE HFA 108 (90 BASE) MCG/ACT IN AERS: 2.0000 | INHALATION_SPRAY | Freq: Four times a day (QID) | RESPIRATORY_TRACT | Status: DC | PRN

## 2014-08-26 MED ORDER — LEVOFLOXACIN IN D5W 500 MG/100ML IV SOLN
500.0000 mg | INTRAVENOUS | Status: DC
Start: 2014-08-26 — End: 2014-08-27
  Administered 2014-08-26: 500 mg via INTRAVENOUS
  Filled 2014-08-26: qty 100

## 2014-08-26 MED ORDER — NICOTINE 21 MG/24HR TD PT24
21.0000 mg | MEDICATED_PATCH | TRANSDERMAL | Status: DC
  Administered 2014-08-27 – 2014-09-01 (×6): 21 mg via TRANSDERMAL
  Filled 2014-08-26 (×8): qty 1

## 2014-08-26 MED ORDER — GABAPENTIN 300 MG PO CAPS
600.0000 mg | ORAL_CAPSULE | Freq: Three times a day (TID) | ORAL | Status: DC
  Administered 2014-08-26: 600 mg via ORAL
  Filled 2014-08-26: qty 2

## 2014-08-26 MED ORDER — MORPHINE SULFATE ER 30 MG PO TBCR
30.0000 mg | EXTENDED_RELEASE_TABLET | Freq: Four times a day (QID) | ORAL | Status: DC
  Administered 2014-08-26: 30 mg via ORAL
  Filled 2014-08-26: qty 1

## 2014-08-26 MED ORDER — ACETAMINOPHEN 325 MG PO TABS: 650.0000 mg | ORAL_TABLET | Freq: Four times a day (QID) | ORAL | Status: DC | PRN

## 2014-08-26 NOTE — ED Notes (Signed)
Pt in from home. Progressive weakening of L hand and arm over last week. Sts this morning she woke up and she had almost no use of her L hand. Pt is weaker in her L hand with grips, but able to lift arm, some drift present. Legs equal, face equal. No slurred speech.

## 2014-08-26 NOTE — ED Notes (Signed)
Pt is aware that a urine sample is needed.  

## 2014-08-26 NOTE — H&P (Signed)
[name] is an 68 y.o. female.    Yaakov Guthrie (pcp)  Chief Complaint: hypokalemia HPI:  68 yo female with dm2, hypertension, Copd, not on home o2 apparently c/o weakness in the left arm for about the past week,  But was worse today.  Pt therefore presented to the ED and was noted to have severe hypokalemia.  CT scan brain negative.  Pt also noted to be hyponatremic and with acute renal failure. Pt notes slight numbness, tingling in the 1st and 2nd finger left hand.  Pt notes that her bp medication was adjusted somewhat recently.  Pt taken off clonidine and placed on another medication.  Pt denies nsaid use. Pt will be admitted for hypokalemia, hyponatremia, and acute renal failure.   Past Medical History  Diagnosis Date  . Myocardial infarction 2000  . Diabetes mellitus type 2, insulin dependent   . COPD (chronic obstructive pulmonary disease)   . Hyperlipidemia   . Breast cancer   . OA (osteoarthritis) of knee   . Hypertension   . Coronary artery disease   . Asthma   . Elevated LFTs   . ETOH abuse   . Tobacco abuse   . Osteoarthritis     Past Surgical History  Procedure Laterality Date  . Breast surgery  1991    right mastectomy  . Vulvectomy  1981    partial  . Neck fusion    . Pelvic laparoscopy  2002    RSO  . Cesarean section  '78, '80, '81    x 3  . Hysteroscopy      D & C  . Cardiac catheterization  04/05/2009    EF 60%  . Lobectomy    . Tonsillectomy and adenoidectomy    . Mastectomy    . Coronary angioplasty  08/1998    x2 OF A BIFURCATION OM-1, OM-2 LESION  . Coronary angioplasty with stent placement  01/1999    MID FIRST OBTUSE MARGINAL VESSEL  . Coronary angioplasty with stent placement  07/1999    STENTING AT THE CRUX OF THE RIGHT CORONARY ARTERY WITH A 3.8MM X 18MM TETRA STENT  . Cardiovascular stress test  01/31/2005    EF 58%  . Total hip arthroplasty  08/2010    bilat    Family History  Problem Relation Age of Onset  . Diabetes Mother   .  Hypertension Father   . Heart disease Father   . Heart attack Father   . Stroke Father    Social History:  reports that she has been smoking Cigarettes.  She has a 15 pack-year smoking history. She has never used smokeless tobacco. She reports that she drinks alcohol. She reports that she does not use illicit drugs.  Allergies:  Allergies  Allergen Reactions  . Penicillins   . Zithromax [Azithromycin Dihydrate]     ORAL ULCERS      (Not in a hospital admission)  Results for orders placed or performed during the hospital encounter of 08/26/14 (from the past 48 hour(s))  CBC WITH DIFFERENTIAL     Status: Abnormal   Collection Time: 08/26/14  4:25 PM  Result Value Ref Range   WBC 14.1 (H) 4.0 - 10.5 K/uL   RBC 3.07 (L) 3.87 - 5.11 MIL/uL   Hemoglobin 11.1 (L) 12.0 - 15.0 g/dL   HCT 31.1 (L) 36.0 - 46.0 %   MCV 101.3 (H) 78.0 - 100.0 fL   MCH 36.2 (H) 26.0 - 34.0 pg   MCHC  35.7 30.0 - 36.0 g/dL   RDW 13.0 11.5 - 15.5 %   Platelets 265 150 - 400 K/uL   Neutrophils Relative % 82 (H) 43 - 77 %   Neutro Abs 11.5 (H) 1.7 - 7.7 K/uL   Lymphocytes Relative 9 (L) 12 - 46 %   Lymphs Abs 1.3 0.7 - 4.0 K/uL   Monocytes Relative 8 3 - 12 %   Monocytes Absolute 1.1 (H) 0.1 - 1.0 K/uL   Eosinophils Relative 1 0 - 5 %   Eosinophils Absolute 0.2 0.0 - 0.7 K/uL   Basophils Relative 0 0 - 1 %   Basophils Absolute 0.0 0.0 - 0.1 K/uL  Comprehensive metabolic panel     Status: Abnormal   Collection Time: 08/26/14  4:25 PM  Result Value Ref Range   Sodium 121 (LL) 137 - 147 mEq/L    Comment: CRITICAL RESULT CALLED TO, READ BACK BY AND VERIFIED WITH: H WORKMAN 1723 ON 12.12.2015 BY NBROOKS    Potassium <2.2 (LL) 3.7 - 5.3 mEq/L    Comment: REPEATED TO VERIFY CRITICAL RESULT CALLED TO, READ BACK BY AND VERIFIED WITH: H WORKMAN AT 1723 ON 12.12.2015 BY NBROOKS    Chloride 66 (L) 96 - 112 mEq/L   CO2 41 (HH) 19 - 32 mEq/L    Comment: CRITICAL RESULT CALLED TO, READ BACK BY AND VERIFIED  WITH: H WORKMAN AT 1723 ON 12.12.2015 BY NBROOKS    Glucose, Bld 138 (H) 70 - 99 mg/dL   BUN 26 (H) 6 - 23 mg/dL   Creatinine, Ser 1.82 (H) 0.50 - 1.10 mg/dL   Calcium 8.5 8.4 - 10.5 mg/dL   Total Protein 7.0 6.0 - 8.3 g/dL   Albumin 2.5 (L) 3.5 - 5.2 g/dL   AST 90 (H) 0 - 37 U/L   ALT 28 0 - 35 U/L   Alkaline Phosphatase 368 (H) 39 - 117 U/L   Total Bilirubin 0.7 0.3 - 1.2 mg/dL   GFR calc non Af Amer 28 (L) >90 mL/min   GFR calc Af Amer 32 (L) >90 mL/min    Comment: (NOTE) The eGFR has been calculated using the CKD EPI equation. This calculation has not been validated in all clinical situations. eGFR's persistently <90 mL/min signify possible Chronic Kidney Disease.    Anion gap 14 5 - 15  Protime-INR     Status: None   Collection Time: 08/26/14  4:25 PM  Result Value Ref Range   Prothrombin Time 12.6 11.6 - 15.2 seconds   INR 0.94 0.00 - 1.49  Magnesium     Status: None   Collection Time: 08/26/14  4:25 PM  Result Value Ref Range   Magnesium 1.7 1.5 - 2.5 mg/dL  Urinalysis, Routine w reflex microscopic     Status: Abnormal   Collection Time: 08/26/14  6:36 PM  Result Value Ref Range   Color, Urine YELLOW YELLOW   APPearance CLOUDY (A) CLEAR   Specific Gravity, Urine 1.007 1.005 - 1.030   pH 6.5 5.0 - 8.0   Glucose, UA NEGATIVE NEGATIVE mg/dL   Hgb urine dipstick LARGE (A) NEGATIVE   Bilirubin Urine NEGATIVE NEGATIVE   Ketones, ur NEGATIVE NEGATIVE mg/dL   Protein, ur NEGATIVE NEGATIVE mg/dL   Urobilinogen, UA 0.2 0.0 - 1.0 mg/dL   Nitrite NEGATIVE NEGATIVE   Leukocytes, UA MODERATE (A) NEGATIVE  Urine microscopic-add on     Status: Abnormal   Collection Time: 08/26/14  6:36 PM  Result Value Ref Range     Squamous Epithelial / LPF RARE RARE   WBC, UA TOO NUMEROUS TO COUNT <3 WBC/hpf   RBC / HPF 0-2 <3 RBC/hpf   Bacteria, UA MANY (A) RARE   Ct Head Wo Contrast  08/26/2014   CLINICAL DATA:  Progressive left upper extremity weakness over the past 1 week.  EXAM: CT  HEAD WITHOUT CONTRAST  TECHNIQUE: Contiguous axial images were obtained from the base of the skull through the vertex without intravenous contrast.  COMPARISON:  None.  FINDINGS: Mild age related cortical atrophy. Ventricular system normal in size and appearance for age. No mass lesion. No midline shift. No acute hemorrhage or hematoma. No extra-axial fluid collections. No evidence of acute infarction.  No skull fracture or other focal osseous abnormality involving the skull. Visualized paranasal sinuses, bilateral mastoid air cells and bilateral middle ear cavities well-aerated. Mild bilateral carotid siphon atherosclerosis.  IMPRESSION: 1. No acute intracranial abnormality. 2. Mild age related cortical atrophy.   Electronically Signed   By: Evangeline Dakin M.D.   On: 08/26/2014 17:04    Review of Systems  Constitutional: Negative for fever, chills, weight loss, malaise/fatigue and diaphoresis.  HENT: Negative for congestion, ear discharge, ear pain, hearing loss, nosebleeds, sore throat and tinnitus.   Eyes: Negative for blurred vision, double vision, photophobia, pain, discharge and redness.  Respiratory: Negative for cough, hemoptysis, sputum production, shortness of breath, wheezing and stridor.   Cardiovascular: Negative for chest pain, palpitations, orthopnea, claudication, leg swelling and PND.  Gastrointestinal: Negative for heartburn, nausea, vomiting, abdominal pain, diarrhea, constipation, blood in stool and melena.  Genitourinary: Negative for dysuria, urgency, frequency, hematuria and flank pain.  Musculoskeletal: Negative for myalgias, back pain, joint pain, falls and neck pain.  Skin: Negative for itching and rash.  Neurological: Negative for dizziness, tingling, tremors, sensory change, speech change, focal weakness, seizures, weakness and headaches.  Endo/Heme/Allergies: Negative for environmental allergies and polydipsia. Does not bruise/bleed easily.  Psychiatric/Behavioral:  Negative for depression, suicidal ideas, hallucinations and substance abuse. The patient is not nervous/anxious and does not have insomnia.     Blood pressure 96/58, pulse 61, temperature 98.6 F (37 C), temperature source Oral, resp. rate 16, SpO2 94 %. Physical Exam  Constitutional: She is oriented to person, place, and time. She appears well-developed and well-nourished.  HENT:  Head: Normocephalic and atraumatic.  Eyes: Conjunctivae and EOM are normal. Pupils are equal, round, and reactive to light.  Neck: Normal range of motion. Neck supple. No JVD present. No tracheal deviation present. No thyromegaly present.  Cardiovascular: Normal rate and regular rhythm.  Exam reveals no gallop and no friction rub.   No murmur heard. Respiratory: Effort normal and breath sounds normal. No respiratory distress. She has no wheezes. She has no rales.  GI: Soft. Bowel sounds are normal. She exhibits no distension and no mass. There is no tenderness. There is no rebound and no guarding.  Musculoskeletal: Normal range of motion. She exhibits no edema or tenderness.  Lymphadenopathy:    She has no cervical adenopathy.  Neurological: She is alert and oriented to person, place, and time. She has normal reflexes. No cranial nerve deficit. Coordination normal.  Skin: Skin is warm and dry. No rash noted. No erythema. No pallor.  Psychiatric: She has a normal mood and affect. Her behavior is normal. Judgment and thought content normal.     Assessment/Plan Weakness secondary to hypokalemia,  hyponatremia Check  MRI brain r/o cva Hydrate gently with normal saline Replete potassium  Hyponatremia Check cortisol,  tsh, serum osm, urine sodium urine osm  Hypokalemia Check magnesium Replete  ARF Urine sodium, urine eosinophils, urine creatinine Urinalysis hdyrate with normal saline Stop nsaids Stop arb Stop diuretic  Dm2 fsbs ac and qhs,  iss    Verdie Barrows 08/26/2014, 7:29 PM

## 2014-08-26 NOTE — ED Provider Notes (Signed)
CSN: [id number]     Arrival date & time 08/26/14  1532 History   First MD Initiated Contact with Patient 08/26/14 1544     Chief Complaint  Patient presents with  . Weakness      Patient is a 68 y.o. female presenting with weakness. The history is provided by the patient.  Weakness This is a new problem. Episode onset: One week ago. The problem occurs constantly. The problem has been gradually worsening. Pertinent negatives include no chest pain and no headaches. Nothing aggravates the symptoms. Nothing relieves the symptoms.   Ms. Whitson presents for evaluation of left upper extremity weakness. She has had about a week of progressive weakness in the left upper extremity and hands. This morning when she woke up she's been unable to use the left hand. She also feels numb in that hand. She reports a week of tinnitus. She also has weight loss over the last few weeks with decreased appetite and difficulty with eating. She denies any fevers, vomiting, dysuria. She has no history of similar symptoms previously. Symptoms are moderate, constant, worsening.  Past Medical History  Diagnosis Date  . Myocardial infarction 2000  . Diabetes mellitus type 2, insulin dependent   . COPD (chronic obstructive pulmonary disease)   . Hyperlipidemia   . Breast cancer   . OA (osteoarthritis) of knee   . Hypertension   . Coronary artery disease   . Asthma   . Elevated LFTs   . ETOH abuse   . Tobacco abuse   . Osteoarthritis    Past Surgical History  Procedure Laterality Date  . Breast surgery  1991    right mastectomy  . Vulvectomy  1981    partial  . Neck fusion    . Pelvic laparoscopy  2002    RSO  . Cesarean section  '78, '80, '81    x 3  . Hysteroscopy      D & C  . Cardiac catheterization  04/05/2009    EF 60%  . Lobectomy    . Tonsillectomy and adenoidectomy    . Mastectomy    . Coronary angioplasty  08/1998    x2 OF A BIFURCATION OM-1, OM-2 LESION  . Coronary angioplasty with stent  placement  01/1999    MID FIRST OBTUSE MARGINAL VESSEL  . Coronary angioplasty with stent placement  07/1999    STENTING AT THE CRUX OF THE RIGHT CORONARY ARTERY WITH A 3.8MM X 18MM TETRA STENT  . Cardiovascular stress test  01/31/2005    EF 58%  . Total hip arthroplasty  08/2010    bilat   Family History  Problem Relation Age of Onset  . Diabetes Mother   . Hypertension Father   . Heart disease Father   . Heart attack Father   . Stroke Father    History  Substance Use Topics  . Smoking status: Current Every Day Smoker -- 1.00 packs/day for 15 years    Types: Cigarettes  . Smokeless tobacco: Never Used  . Alcohol Use: Yes     Comment: 1 gallon/week   OB History    No data available     Review of Systems  Cardiovascular: Negative for chest pain.       Chronic lower extremity edema  Neurological: Positive for weakness. Negative for headaches.  All other systems reviewed and are negative.     Allergies  Penicillins and Zithromax  Home Medications   Prior to Admission medications   Medication Sig  Start Date End Date Taking? Authorizing Provider  albuterol (PROVENTIL HFA;VENTOLIN HFA) 108 (90 BASE) MCG/ACT inhaler Inhale 2 puffs into the lungs every 6 (six) hours as needed.      Historical Provider, MD  albuterol (PROVENTIL) (2.5 MG/3ML) 0.083% nebulizer solution Take 2.5 mg by nebulization every 6 (six) hours as needed.      Historical Provider, MD  ALPRAZolam Duanne Moron) 0.25 MG tablet Take one tablet daily or as needed 05/14/11   Peter M Martinique, MD  BIOTIN PO Take 1 tablet by mouth daily.     Historical Provider, MD  bisoprolol (ZEBETA) 10 MG tablet Take 5 mg by mouth daily. 05/24/14   Historical Provider, MD  diclofenac (VOLTAREN) 75 MG EC tablet Take 1 tablet by mouth 2 (two) times daily as needed.  06/26/14   Historical Provider, MD  furosemide (LASIX) 40 MG tablet Take 1 tablet (40 mg total) by mouth 2 (two) times daily. 07/12/14   Peter M Martinique, MD  gabapentin  (NEURONTIN) 600 MG tablet Take 600 mg by mouth 3 (three) times daily.     Historical Provider, MD  Ipratropium Bromide HFA (ATROVENT HFA IN) Inhale 17 mcg into the lungs. At bedtime    Historical Provider, MD  JANUVIA 25 MG tablet Take 1 tablet by mouth daily. 06/12/14   Historical Provider, MD  losartan (COZAAR) 25 MG tablet Take 1 tablet (25 mg total) by mouth daily. 06/22/12   Peter M Martinique, MD  metolazone (ZAROXOLYN) 2.5 MG tablet Take 1 tablet (2.5 mg total) by mouth daily. 08/09/14   Isaiah Serge, NP  morphine (KADIAN) 30 MG 24 hr capsule Take 30 mg by mouth daily.      Historical Provider, MD  morphine (MS CONTIN) 30 MG 12 hr tablet  06/26/14   Historical Provider, MD  nicotine (NICODERM CQ - DOSED IN MG/24 HOURS) 21 mg/24hr patch Place 1 patch onto the skin daily.      Historical Provider, MD  nitroGLYCERIN (NITROSTAT) 0.4 MG SL tablet Place 1 tablet (0.4 mg total) under the tongue as needed. 07/20/14   Peter M Martinique, MD  oxyCODONE (ROXICODONE) 15 MG immediate release tablet Take 15 mg by mouth every 6 (six) hours as needed.      Historical Provider, MD  potassium chloride (KLOR-CON) 10 MEQ CR tablet Take 10-20 mEq by mouth daily. Take 72meq on the days you take Metolazone.    Historical Provider, MD  simvastatin (ZOCOR) 10 MG tablet Take 1 tablet (10 mg total) by mouth every evening. 01/20/12 01/19/13  Peter M Martinique, MD   BP 98/64 mmHg  Pulse 70  Temp(Src) 98.6 F (37 C) (Oral)  Resp 18  SpO2 90% Physical Exam  Constitutional: She is oriented to person, place, and time. She appears well-developed and well-nourished.  HENT:  Head: Normocephalic and atraumatic.  Cardiovascular: Normal rate and regular rhythm.   No murmur heard. Pulmonary/Chest: Effort normal.  Rhonchi bilaterally no respiratory distress  Abdominal: Soft. There is no tenderness. There is no rebound and no guarding.  Musculoskeletal: She exhibits no tenderness.  3-4+ pitting edema in bilateral lower extremities   Neurological: She is alert and oriented to person, place, and time.  No facial asymmetry. 3-4/5 grip strength in LUE. 5/5 grip strength in RUE.  5/5 strength of proximal upper extremity muscles.  Weakness on extension of left wrist.  5/5 strength in BLE. Sensation to light touch intact in all four extremities.    Skin: Skin is warm and dry.  Psychiatric: She has a normal mood and affect. Her behavior is normal.  Nursing note and vitals reviewed.   ED Course  Procedures (including critical care time) Labs Review Labs Reviewed  URINALYSIS, ROUTINE W REFLEX MICROSCOPIC - Abnormal; Notable for the following:    APPearance CLOUDY (*)    Hgb urine dipstick LARGE (*)    Leukocytes, UA MODERATE (*)    All other components within normal limits  CBC WITH DIFFERENTIAL - Abnormal; Notable for the following:    WBC 14.1 (*)    RBC 3.07 (*)    Hemoglobin 11.1 (*)    HCT 31.1 (*)    MCV 101.3 (*)    MCH 36.2 (*)    Neutrophils Relative % 82 (*)    Neutro Abs 11.5 (*)    Lymphocytes Relative 9 (*)    Monocytes Absolute 1.1 (*)    All other components within normal limits  COMPREHENSIVE METABOLIC PANEL - Abnormal; Notable for the following:    Sodium 121 (*)    Potassium <2.2 (*)    Chloride 66 (*)    CO2 41 (*)    Glucose, Bld 138 (*)    BUN 26 (*)    Creatinine, Ser 1.82 (*)    Albumin 2.5 (*)    AST 90 (*)    Alkaline Phosphatase 368 (*)    GFR calc non Af Amer 28 (*)    GFR calc Af Amer 32 (*)    All other components within normal limits  URINE MICROSCOPIC-ADD ON - Abnormal; Notable for the following:    Bacteria, UA MANY (*)    All other components within normal limits  MRSA PCR SCREENING  PROTIME-INR  MAGNESIUM  HEMOGLOBIN A1C  TSH  CORTISOL  OSMOLALITY  SODIUM, URINE, RANDOM  CALCIUM / CREATININE RATIO, URINE  OSMOLALITY, URINE  CBC WITH DIFFERENTIAL  COMPREHENSIVE METABOLIC PANEL  BASIC METABOLIC PANEL  CYTOLOGY - NON PAP    Imaging Review Ct Head Wo  Contrast  08/26/2014   CLINICAL DATA:  Progressive left upper extremity weakness over the past 1 week.  EXAM: CT HEAD WITHOUT CONTRAST  TECHNIQUE: Contiguous axial images were obtained from the base of the skull through the vertex without intravenous contrast.  COMPARISON:  None.  FINDINGS: Mild age related cortical atrophy. Ventricular system normal in size and appearance for age. No mass lesion. No midline shift. No acute hemorrhage or hematoma. No extra-axial fluid collections. No evidence of acute infarction.  No skull fracture or other focal osseous abnormality involving the skull. Visualized paranasal sinuses, bilateral mastoid air cells and bilateral middle ear cavities well-aerated. Mild bilateral carotid siphon atherosclerosis.  IMPRESSION: 1. No acute intracranial abnormality. 2. Mild age related cortical atrophy.   Electronically Signed   By: Evangeline Dakin M.D.   On: 08/26/2014 17:04     EKG Interpretation   Date/Time:  Saturday August 26 2014 16:06:34 EST Ventricular Rate:  64 PR Interval:  137 QRS Duration: 102 QT Interval:  499 QTC Calculation: 515 R Axis:   60 Text Interpretation:  Sinus rhythm Atrial premature complex Low voltage,  precordial leads Repol abnrm suggests ischemia, diffuse leads Prolonged QT  interval Baseline wander in lead(s) V6 Confirmed by Hazle Coca [phone]) on  08/26/2014 4:17:21 PM      MDM   Final diagnoses:  Hypokalemia  Hyponatremia  Acute renal failure, unspecified acute renal failure type  Left arm weakness    Patient presents for evaluation of progressive left upper extremity weakness.  She also endorses decreased by mouth intake as well as weight loss. Pt with significant leg edema, but concerned for dehydration based on hx.  Clinical picture is not consistent with acute CVA, symptoms have been ongoing for a week. BMP is significant for acute renal insufficiency with hyponatremia hypokalemia and hypochloremia. Patient started on gentle  fluid hydration and potassium supplementation. Discussed with hospitalist regarding admission for further management.    Quintella Reichert, MD 08/26/14 2159

## 2014-08-27 ENCOUNTER — Inpatient Hospital Stay (HOSPITAL_COMMUNITY): Payer: BC Managed Care – PPO

## 2014-08-27 DIAGNOSIS — E119 Type 2 diabetes mellitus without complications: Secondary | ICD-10-CM

## 2014-08-27 DIAGNOSIS — N179 Acute kidney failure, unspecified: Secondary | ICD-10-CM

## 2014-08-27 DIAGNOSIS — Z794 Long term (current) use of insulin: Secondary | ICD-10-CM

## 2014-08-27 DIAGNOSIS — A419 Sepsis, unspecified organism: Secondary | ICD-10-CM

## 2014-08-27 DIAGNOSIS — I10 Essential (primary) hypertension: Secondary | ICD-10-CM

## 2014-08-27 DIAGNOSIS — I4891 Unspecified atrial fibrillation: Secondary | ICD-10-CM

## 2014-08-27 DIAGNOSIS — N390 Urinary tract infection, site not specified: Secondary | ICD-10-CM | POA: Diagnosis present

## 2014-08-27 DIAGNOSIS — I480 Paroxysmal atrial fibrillation: Secondary | ICD-10-CM

## 2014-08-27 DIAGNOSIS — R579 Shock, unspecified: Secondary | ICD-10-CM

## 2014-08-27 DIAGNOSIS — G9340 Encephalopathy, unspecified: Secondary | ICD-10-CM

## 2014-08-27 LAB — COMPREHENSIVE METABOLIC PANEL
ALK PHOS: 297 U/L — AB (ref 39–117)
ALT: 23 U/L (ref 0–35)
ANION GAP: 13 (ref 5–15)
AST: 75 U/L — ABNORMAL HIGH (ref 0–37)
Albumin: 1.9 g/dL — ABNORMAL LOW (ref 3.5–5.2)
BILIRUBIN TOTAL: 0.5 mg/dL (ref 0.3–1.2)
BUN: 23 mg/dL (ref 6–23)
CO2: 41 meq/L — AB (ref 19–32)
Calcium: 8 mg/dL — ABNORMAL LOW (ref 8.4–10.5)
Chloride: 73 mEq/L — ABNORMAL LOW (ref 96–112)
Creatinine, Ser: 1.3 mg/dL — ABNORMAL HIGH (ref 0.50–1.10)
GFR, EST AFRICAN AMERICAN: 48 mL/min — AB (ref >90)
GFR, EST NON AFRICAN AMERICAN: 42 mL/min — AB (ref >90)
GLUCOSE: 90 mg/dL (ref 70–99)
POTASSIUM: 2.4 meq/L — AB (ref 3.7–5.3)
SODIUM: 127 meq/L — AB (ref 137–147)
Total Protein: 5.8 g/dL — ABNORMAL LOW (ref 6.0–8.3)

## 2014-08-27 LAB — BASIC METABOLIC PANEL
ANION GAP: 21 — AB (ref 5–15)
ANION GAP: 12 (ref 5–15)
Anion gap: 9 (ref 5–15)
BUN: 21 mg/dL (ref 6–23)
BUN: 15 mg/dL (ref 6–23)
BUN: 11 mg/dL (ref 6–23)
CALCIUM: 7.7 mg/dL — AB (ref 8.4–10.5)
CHLORIDE: 77 meq/L — AB (ref 96–112)
CHLORIDE: 81 meq/L — AB (ref 96–112)
CO2: 33 mEq/L — ABNORMAL HIGH (ref 19–32)
CO2: 39 meq/L — AB (ref 19–32)
CO2: 35 mEq/L — ABNORMAL HIGH (ref 19–32)
CREATININE: 0.74 mg/dL (ref 0.50–1.10)
Calcium: 7.7 mg/dL — ABNORMAL LOW (ref 8.4–10.5)
Calcium: 8 mg/dL — ABNORMAL LOW (ref 8.4–10.5)
Chloride: 82 mEq/L — ABNORMAL LOW (ref 96–112)
Creatinine, Ser: 1.12 mg/dL — ABNORMAL HIGH (ref 0.50–1.10)
Creatinine, Ser: 0.6 mg/dL (ref 0.50–1.10)
GFR calc Af Amer: 58 mL/min — ABNORMAL LOW (ref >90)
GFR calc Af Amer: >90 mL/min (ref >90)
GFR calc Af Amer: >90 mL/min (ref >90)
GFR, EST NON AFRICAN AMERICAN: 50 mL/min — AB (ref >90)
GFR, EST NON AFRICAN AMERICAN: 87 mL/min — AB (ref >90)
GLUCOSE: 147 mg/dL — AB (ref 70–99)
Glucose, Bld: 84 mg/dL (ref 70–99)
Glucose, Bld: 220 mg/dL — ABNORMAL HIGH (ref 70–99)
POTASSIUM: 2.5 meq/L — AB (ref 3.7–5.3)
POTASSIUM: 3 meq/L — AB (ref 3.7–5.3)
Potassium: 2.6 mEq/L — CL (ref 3.7–5.3)
SODIUM: 131 meq/L — AB (ref 137–147)
SODIUM: 128 meq/L — AB (ref 137–147)
Sodium: 130 mEq/L — ABNORMAL LOW (ref 137–147)

## 2014-08-27 LAB — HEMOGLOBIN A1C
Hgb A1c MFr Bld: 5.5 % (ref <5.7)
MEAN PLASMA GLUCOSE: 111 mg/dL (ref <117)

## 2014-08-27 LAB — CBC WITH DIFFERENTIAL/PLATELET
Basophils Absolute: 0 10*3/uL (ref 0.0–0.1)
Basophils Relative: 0 % (ref 0–1)
EOS ABS: 0.2 10*3/uL (ref 0.0–0.7)
Eosinophils Relative: 2 % (ref 0–5)
HCT: 27.5 % — ABNORMAL LOW (ref 36.0–46.0)
HEMOGLOBIN: 9.7 g/dL — AB (ref 12.0–15.0)
LYMPHS ABS: 1.5 10*3/uL (ref 0.7–4.0)
LYMPHS PCT: 15 % (ref 12–46)
MCH: 35.5 pg — AB (ref 26.0–34.0)
MCHC: 35.3 g/dL (ref 30.0–36.0)
MCV: 100.7 fL — AB (ref 78.0–100.0)
Monocytes Absolute: 0.7 10*3/uL (ref 0.1–1.0)
Monocytes Relative: 7 % (ref 3–12)
NEUTROS PCT: 76 % (ref 43–77)
Neutro Abs: 7.4 10*3/uL (ref 1.7–7.7)
PLATELETS: 212 10*3/uL (ref 150–400)
RBC: 2.73 MIL/uL — ABNORMAL LOW (ref 3.87–5.11)
RDW: 13.1 % (ref 11.5–15.5)
WBC: 9.8 10*3/uL (ref 4.0–10.5)

## 2014-08-27 LAB — GLUCOSE, CAPILLARY
GLUCOSE-CAPILLARY: 46 mg/dL — AB (ref 70–99)
Glucose-Capillary: 78 mg/dL (ref 70–99)
Glucose-Capillary: 112 mg/dL — ABNORMAL HIGH (ref 70–99)
Glucose-Capillary: 119 mg/dL — ABNORMAL HIGH (ref 70–99)
Glucose-Capillary: 225 mg/dL — ABNORMAL HIGH (ref 70–99)
Glucose-Capillary: 220 mg/dL — ABNORMAL HIGH (ref 70–99)

## 2014-08-27 LAB — BLOOD GAS, ARTERIAL
ACID-BASE EXCESS: 12.4 mmol/L — AB (ref 0.0–2.0)
Acid-Base Excess: 14.7 mmol/L — ABNORMAL HIGH (ref 0.0–2.0)
BICARBONATE: 39.5 meq/L — AB (ref 20.0–24.0)
Bicarbonate: 36.7 mEq/L — ABNORMAL HIGH (ref 20.0–24.0)
Drawn by: 257701
Drawn by: 257701
FIO2: 1 %
FIO2: 1 %
O2 Saturation: 93.8 %
O2 Saturation: 89.3 %
PATIENT TEMPERATURE: 98.6
PH ART: 7.52 — AB (ref 7.350–7.450)
Patient temperature: 98.6
TCO2: 35.9 mmol/L (ref 0–100)
TCO2: 32 mmol/L (ref 0–100)
pCO2 arterial: 48.7 mmHg — ABNORMAL HIGH (ref 35.0–45.0)
pCO2 arterial: 44.3 mmHg (ref 35.0–45.0)
pH, Arterial: 7.529 — ABNORMAL HIGH (ref 7.350–7.450)
pO2, Arterial: 68.9 mmHg — ABNORMAL LOW (ref 80.0–100.0)
pO2, Arterial: 56.7 mmHg — ABNORMAL LOW (ref 80.0–100.0)

## 2014-08-27 LAB — TROPONIN I
TROPONIN I: 0.53 ng/mL — AB (ref <0.30)
TROPONIN I: 1.2 ng/mL — AB (ref <0.30)

## 2014-08-27 LAB — CALCIUM / CREATININE RATIO, URINE
Calcium, Ur: <1 mg/dL
Creatinine, Urine: 29.4 mg/dL

## 2014-08-27 LAB — OSMOLALITY, URINE: Osmolality, Ur: 213 mOsm/kg — ABNORMAL LOW (ref 390–1090)

## 2014-08-27 LAB — CORTISOL: CORTISOL PLASMA: 16.3 ug/dL

## 2014-08-27 LAB — PROCALCITONIN: Procalcitonin: 0.57 ng/mL

## 2014-08-27 LAB — LACTIC ACID, PLASMA
LACTIC ACID, VENOUS: 1.5 mmol/L (ref 0.5–2.2)
Lactic Acid, Venous: 2.7 mmol/L — ABNORMAL HIGH (ref 0.5–2.2)

## 2014-08-27 LAB — STREP PNEUMONIAE URINARY ANTIGEN: STREP PNEUMO URINARY ANTIGEN: NEGATIVE

## 2014-08-27 LAB — TSH: TSH: 2.12 u[IU]/mL (ref 0.350–4.500)

## 2014-08-27 LAB — SODIUM, URINE, RANDOM: Sodium, Ur: 65 mEq/L

## 2014-08-27 LAB — OSMOLALITY: OSMOLALITY: 257 mosm/kg — AB (ref 275–300)

## 2014-08-27 LAB — PRO B NATRIURETIC PEPTIDE: PRO B NATRI PEPTIDE: 5589 pg/mL — AB (ref 0–125)

## 2014-08-27 LAB — MAGNESIUM
Magnesium: 1.5 mg/dL (ref 1.5–2.5)
Magnesium: 1.8 mg/dL (ref 1.5–2.5)

## 2014-08-27 LAB — PHOSPHORUS: Phosphorus: 3.4 mg/dL (ref 2.3–4.6)

## 2014-08-27 MED ORDER — POTASSIUM CHLORIDE 10 MEQ/100ML IV SOLN: 10.0000 meq | INTRAVENOUS | Status: DC

## 2014-08-27 MED ORDER — MAGNESIUM SULFATE 2 GM/50ML IV SOLN
2.0000 g | Freq: Once | INTRAVENOUS | Status: AC
  Administered 2014-08-27: 2 g via INTRAVENOUS
  Filled 2014-08-27: qty 50

## 2014-08-27 MED ORDER — POTASSIUM CHLORIDE CRYS ER 20 MEQ PO TBCR
40.0000 meq | EXTENDED_RELEASE_TABLET | Freq: Once | ORAL | Status: AC
  Administered 2014-08-27: 40 meq via ORAL
  Filled 2014-08-27: qty 2

## 2014-08-27 MED ORDER — POTASSIUM CHLORIDE CRYS ER 20 MEQ PO TBCR
40.0000 meq | EXTENDED_RELEASE_TABLET | ORAL | Status: AC
Start: 2014-08-27 — End: 2014-08-27
  Administered 2014-08-27 (×3): 40 meq via ORAL
  Filled 2014-08-27 (×3): qty 2

## 2014-08-27 MED ORDER — VITAMINS A & D EX OINT
TOPICAL_OINTMENT | CUTANEOUS | Status: AC
  Administered 2014-08-27: 20:00:00
  Filled 2014-08-27: qty 5

## 2014-08-27 MED ORDER — LEVOFLOXACIN IN D5W 500 MG/100ML IV SOLN
500.0000 mg | INTRAVENOUS | Status: DC
  Administered 2014-08-27 – 2014-08-28 (×2): 500 mg via INTRAVENOUS
  Filled 2014-08-27 (×2): qty 100

## 2014-08-27 MED ORDER — INSULIN ASPART 100 UNIT/ML ~~LOC~~ SOLN
0.0000 [IU] | SUBCUTANEOUS | Status: DC
  Administered 2014-08-27: 5 [IU] via SUBCUTANEOUS
  Administered 2014-08-28: 3 [IU] via SUBCUTANEOUS
  Administered 2014-08-28: 5 [IU] via SUBCUTANEOUS
  Administered 2014-08-28: 3 [IU] via SUBCUTANEOUS
  Administered 2014-08-28 – 2014-08-29 (×3): 2 [IU] via SUBCUTANEOUS
  Administered 2014-08-29: 3 [IU] via SUBCUTANEOUS

## 2014-08-27 MED ORDER — FENTANYL CITRATE 0.05 MG/ML IJ SOLN: 12.5000 ug | INTRAMUSCULAR | Status: DC | PRN

## 2014-08-27 MED ORDER — SODIUM CHLORIDE 0.9 % IV BOLUS (SEPSIS)
1000.0000 mL | Freq: Once | INTRAVENOUS | Status: AC
  Administered 2014-08-27: 1000 mL via INTRAVENOUS

## 2014-08-27 MED ORDER — POTASSIUM CHLORIDE 10 MEQ/50ML IV SOLN
10.0000 meq | INTRAVENOUS | Status: AC
  Administered 2014-08-27 (×3): 10 meq via INTRAVENOUS
  Filled 2014-08-27 (×3): qty 50

## 2014-08-27 MED ORDER — POTASSIUM CHLORIDE 10 MEQ/100ML IV SOLN
10.0000 meq | INTRAVENOUS | Status: DC
  Administered 2014-08-27: 10 meq via INTRAVENOUS
  Filled 2014-08-27: qty 100

## 2014-08-27 MED ORDER — CETYLPYRIDINIUM CHLORIDE 0.05 % MT LIQD
7.0000 mL | Freq: Two times a day (BID) | OROMUCOSAL | Status: DC
  Administered 2014-08-27 – 2014-09-02 (×11): 7 mL via OROMUCOSAL

## 2014-08-27 MED ORDER — POTASSIUM CHLORIDE 10 MEQ/50ML IV SOLN
10.0000 meq | INTRAVENOUS | Status: AC
  Administered 2014-08-27 – 2014-08-28 (×6): 10 meq via INTRAVENOUS
  Filled 2014-08-27 (×4): qty 50

## 2014-08-27 MED ORDER — MORPHINE SULFATE ER 30 MG PO TBCR: 30.0000 mg | EXTENDED_RELEASE_TABLET | Freq: Two times a day (BID) | ORAL | Status: DC

## 2014-08-27 MED ORDER — SODIUM CHLORIDE 0.9 % IV BOLUS (SEPSIS)
500.0000 mL | Freq: Once | INTRAVENOUS | Status: AC
Start: 2014-08-27 — End: 2014-08-27
  Administered 2014-08-27: 500 mL via INTRAVENOUS

## 2014-08-27 MED ORDER — INFLUENZA VAC SPLIT QUAD 0.5 ML IM SUSY
0.5000 mL | PREFILLED_SYRINGE | INTRAMUSCULAR | Status: AC
  Administered 2014-08-28: 0.5 mL via INTRAMUSCULAR
  Filled 2014-08-27 (×2): qty 0.5

## 2014-08-27 MED ORDER — PHENYLEPHRINE HCL 10 MG/ML IJ SOLN
0.0000 ug/min | INTRAVENOUS | Status: DC
  Administered 2014-08-27: 120 ug/min via INTRAVENOUS
  Filled 2014-08-27: qty 4

## 2014-08-27 MED ORDER — POTASSIUM CHLORIDE 10 MEQ/50ML IV SOLN
INTRAVENOUS | Status: AC
  Filled 2014-08-27: qty 50

## 2014-08-27 MED ORDER — POTASSIUM CHLORIDE IN NACL 40-0.9 MEQ/L-% IV SOLN
INTRAVENOUS | Status: DC
  Administered 2014-08-27: 100 mL/h via INTRAVENOUS
  Filled 2014-08-27 (×2): qty 1000

## 2014-08-27 MED ORDER — NOREPINEPHRINE BITARTRATE 1 MG/ML IV SOLN
2.0000 ug/min | INTRAVENOUS | Status: DC
  Administered 2014-08-27: 6 ug/min via INTRAVENOUS
  Administered 2014-08-27: 12 ug/min via INTRAVENOUS
  Administered 2014-08-28: 10 ug/min via INTRAVENOUS
  Filled 2014-08-27 (×4): qty 4

## 2014-08-27 MED ORDER — PNEUMOCOCCAL VAC POLYVALENT 25 MCG/0.5ML IJ INJ
0.5000 mL | INJECTION | INTRAMUSCULAR | Status: DC
  Filled 2014-08-27 (×2): qty 0.5

## 2014-08-27 MED ORDER — SODIUM CHLORIDE 0.9 % IV BOLUS (SEPSIS): 1000.0000 mL | Freq: Once | INTRAVENOUS | Status: AC

## 2014-08-27 MED ORDER — SODIUM CHLORIDE 0.9 % IJ SOLN
10.0000 mL | Freq: Two times a day (BID) | INTRAMUSCULAR | Status: DC
  Administered 2014-08-29 – 2014-09-01 (×6): 10 mL

## 2014-08-27 MED ORDER — PHENYLEPHRINE HCL 10 MG/ML IJ SOLN
0.0000 ug/min | INTRAMUSCULAR | Status: DC
  Administered 2014-08-27: 60 ug/min via INTRAVENOUS
  Administered 2014-08-27: 20 ug/min via INTRAVENOUS
  Administered 2014-08-27: 40 ug/min via INTRAVENOUS
  Filled 2014-08-27 (×2): qty 1

## 2014-08-27 MED ORDER — FENTANYL CITRATE 0.05 MG/ML IJ SOLN: 25.0000 ug | INTRAMUSCULAR | Status: DC | PRN

## 2014-08-27 MED ORDER — FENTANYL CITRATE 0.05 MG/ML IJ SOLN
12.5000 ug | INTRAMUSCULAR | Status: DC | PRN
  Administered 2014-08-27: 12.5 ug via INTRAVENOUS
  Administered 2014-08-28 – 2014-08-30 (×18): 50 ug via INTRAVENOUS
  Administered 2014-08-30 (×2): 12.5 ug via INTRAVENOUS
  Administered 2014-08-30 (×2): 50 ug via INTRAVENOUS
  Filled 2014-08-27 (×24): qty 2

## 2014-08-27 MED ORDER — SODIUM CHLORIDE 0.9 % IJ SOLN
10.0000 mL | INTRAMUSCULAR | Status: DC | PRN
  Administered 2014-09-02: 10 mL
  Filled 2014-08-27: qty 40

## 2014-08-27 MED ORDER — SODIUM CHLORIDE 0.9 % IV SOLN: INTRAVENOUS | Status: DC

## 2014-08-27 MED ORDER — IPRATROPIUM-ALBUTEROL 0.5-2.5 (3) MG/3ML IN SOLN
3.0000 mL | Freq: Four times a day (QID) | RESPIRATORY_TRACT | Status: DC
  Administered 2014-08-27 – 2014-08-30 (×14): 3 mL via RESPIRATORY_TRACT
  Filled 2014-08-27 (×14): qty 3

## 2014-08-27 MED ORDER — HYDROCORTISONE NA SUCCINATE PF 100 MG IJ SOLR
50.0000 mg | Freq: Four times a day (QID) | INTRAMUSCULAR | Status: DC
  Administered 2014-08-27 – 2014-08-28 (×3): 50 mg via INTRAVENOUS
  Filled 2014-08-27 (×3): qty 2

## 2014-08-27 MED ORDER — VANCOMYCIN HCL IN DEXTROSE 1-5 GM/200ML-% IV SOLN
1000.0000 mg | Freq: Two times a day (BID) | INTRAVENOUS | Status: DC
  Administered 2014-08-27 – 2014-08-29 (×5): 1000 mg via INTRAVENOUS
  Filled 2014-08-27 (×5): qty 200

## 2014-08-27 MED ORDER — SODIUM CHLORIDE 0.9 % IV SOLN
500.0000 mg | Freq: Three times a day (TID) | INTRAVENOUS | Status: AC
  Administered 2014-08-27 – 2014-08-30 (×11): 500 mg via INTRAVENOUS
  Filled 2014-08-27 (×11): qty 500

## 2014-08-27 NOTE — Consult Note (Addendum)
PULMONARY / CRITICAL CARE MEDICINE   Name: ERNELL SKALSKI MRN: [id number] DOB: [birth year]   PCP Redmond Baseman, MD LOS 1 days  ADMISSION DATE:  08/26/2014 CONSULTATION DATE:  08/27/14  REFERRING MD :  Dr Onalee Hua Tat of [facility]  CHIEF COMPLAINT:  Circulatory shock  INITIAL PRESENTATION: see HPI   SIGNIFICANT EVENTS: 08/26/2014 - admit 08/27/14 - PCCM consult for pressor need    HISTORY OF PRESENT ILLNESS:  Hx no avaialble from patient and discerned from chart  68 yo female with dm2, hypertension, Copd, chronic pain (MS contin 30mg  bid, neurotiin 600 tid, diclofenac, oxycodone prn) not on home o2 apparently c/o weakness in the left arm for about one week prior to admission but worse on day of admit 08/26/2014.  Pt therefore presented to the ED and was noted to have severe hypokalemia. CT scan brain negative. Pt also noted to be hyponatremic and with acute renal failure. Pt notes slight numbness, tingling in the 1st and 2nd finger left hand. Pt notes that her bp medication was adjusted somewhat recently. Pt taken off clonidine and placed on another medication. Pt denies nsaid use. Pt  admitted for hypokalemia, hyponatremia, and acute renal failure on 08/26/2014. Subsequently becamse hypotensive needing Neosynephrine (despite 4L fluids)and also hypoxemic needing NRB with CXR showing LLL consolidation and UA suggesting UTI. She also had transint A Fib. On 08/27/14 due to continued pressors PCCM consulted   PAST MEDICAL HISTORY :   has a past medical history of Myocardial infarction (2000); Diabetes mellitus type 2, insulin dependent; COPD (chronic obstructive pulmonary disease); Hyperlipidemia; Breast cancer; OA (osteoarthritis) of knee; Hypertension; Coronary artery disease; Asthma; Elevated LFTs; ETOH abuse; Tobacco abuse; and Osteoarthritis.  has past surgical history that includes Breast surgery (1991); Vulvectomy (1981); neck fusion; Pelvic laparoscopy (2002); Cesarean section  ('78, '80, '81); Hysteroscopy; Cardiac catheterization (04/05/2009); Lobectomy; Tonsillectomy and adenoidectomy; Mastectomy; Coronary angioplasty (08/1998); Coronary angioplasty with stent (01/1999); Coronary angioplasty with stent (07/1999); Cardiovascular stress test (01/31/2005); and Total hip arthroplasty (08/2010). Prior to Admission medications   Medication Sig Start Date End Date Taking? Authorizing Provider  albuterol (PROVENTIL HFA;VENTOLIN HFA) 108 (90 BASE) MCG/ACT inhaler Inhale 2-4 puffs into the lungs every 6 (six) hours as needed for wheezing (wheezing).    Yes Historical Provider, MD  BIOTIN PO Take 1 tablet by mouth daily.    Yes Historical Provider, MD  bisoprolol (ZEBETA) 10 MG tablet Take 5 mg by mouth daily. 05/24/14  Yes Historical Provider, MD  diclofenac (VOLTAREN) 75 MG EC tablet Take 1 tablet by mouth 2 (two) times daily as needed for mild pain or moderate pain (pain).  06/26/14  Yes Historical Provider, MD  furosemide (LASIX) 40 MG tablet Take 1 tablet (40 mg total) by mouth 2 (two) times daily. 07/12/14  Yes Peter M [place], MD  gabapentin (NEURONTIN) 600 MG tablet Take 600 mg by mouth 3 (three) times daily.    Yes Historical Provider, MD  Ipratropium Bromide HFA (ATROVENT HFA IN) Inhale 17 mcg into the lungs at bedtime.    Yes Historical Provider, MD  JANUVIA 25 MG tablet Take 1 tablet by mouth daily. 06/12/14  Yes Historical Provider, MD  losartan (COZAAR) 25 MG tablet Take 1 tablet (25 mg total) by mouth daily. 06/22/12  Yes Peter M [place], MD  metolazone (ZAROXOLYN) 2.5 MG tablet Take 1 tablet (2.5 mg total) by mouth daily. Patient taking differently: Take 2.5 mg by mouth every other day.  08/09/14  Yes Leone Brand, NP  morphine (MS CONTIN) 30 MG 12 hr tablet Take 30 mg by mouth 4 (four) times daily.  06/26/14  Yes Historical Provider, MD  nicotine (NICODERM CQ - DOSED IN MG/24 HOURS) 21 mg/24hr patch Place 1 patch onto the skin daily.     Yes Historical Provider, MD   oxyCODONE (ROXICODONE) 15 MG immediate release tablet Take 15 mg by mouth every 6 (six) hours as needed.     Yes Historical Provider, MD  simvastatin (ZOCOR) 10 MG tablet Take 10 mg by mouth every evening.   Yes Historical Provider, MD  albuterol (PROVENTIL) (2.5 MG/3ML) 0.083% nebulizer solution Take 2.5 mg by nebulization every 6 (six) hours as needed.      Historical Provider, MD  ALPRAZolam Prudy Feeler) 0.25 MG tablet Take one tablet daily or as needed 05/14/11   Peter M [place], MD  morphine (KADIAN) 30 MG 24 hr capsule Take 30 mg by mouth daily.      Historical Provider, MD  nitroGLYCERIN (NITROSTAT) 0.4 MG SL tablet Place 1 tablet (0.4 mg total) under the tongue as needed. 07/20/14   Peter M [place], MD  simvastatin (ZOCOR) 10 MG tablet Take 1 tablet (10 mg total) by mouth every evening. Patient not taking: Reported on 08/26/2014 01/20/12 01/19/13  Peter M [place], MD   Allergies  Allergen Reactions  . Penicillins   . Zithromax [Azithromycin Dihydrate]     ORAL ULCERS     FAMILY HISTORY:  indicated that her mother is alive. She indicated that her father is deceased.  SOCIAL HISTORY:  reports that she has been smoking Cigarettes.  She has a 15 pack-year smoking history. She has never used smokeless tobacco. She reports that she drinks alcohol. She reports that she does not use illicit drugs.  REVIEW OF SYSTEMS:   unelicitable due to circulatory shock and altered mental status.   SUBJECTIVE:   VITAL SIGNS: Temp:  [97.6 F (36.4 C)-98.6 F (37 C)] 98.3 F (36.8 C) (12/13 1130) Pulse Rate:  [40-128] 72 (12/13 1342) Resp:  [12-29] 22 (12/13 1342) BP: (61-114)/(28-69) 114/69 mmHg (12/13 1331) SpO2:  [84 %-100 %] 91 % (12/13 1342) FiO2 (%):  [100 %] 100 % (12/13 0930) Weight:  [69.1 kg (152 lb 5.4 oz)-72.8 kg (160 lb 7.9 oz)] 72.8 kg (160 lb 7.9 oz) (12/13 0400) HEMODYNAMICS:   VENTILATOR SETTINGS: Vent Mode:  [-]  FiO2 (%):  [100 %] 100 % INTAKE / OUTPUT:  Intake/Output Summary (Last  24 hours) at 08/27/14 1355 Last data filed at 08/27/14 1328  Gross per 24 hour  Intake 5248.9 ml  Output   5900 ml  Net -651.1 ml    PHYSICAL EXAMINATION: General:  Chronically ill looking deconditioned female. Looks much older than stated age Neuro:  Currently drowsy. RASS -2 equivalent. RN says she is sleeping HEENT:  Supple neck Cardiovascular:  S1S2+. No murmurs Lungs:  On O2. Mild Increased WOB Abdomen:  Soft, non tender, no organomegaly Musculoskeletal:  Chronic pedal edema + Skin:  Left forearm bruised +  LABS:  PULMONARY  Recent Labs Lab 08/27/14 0844  PHART 7.520*  PCO2ART 48.7*  PO2ART 68.9*  HCO3 39.5*  TCO2 35.9  O2SAT 93.8    CBC  Recent Labs Lab 08/26/14 1625 08/27/14 0134  HGB 11.1* 9.7*  HCT 31.1* 27.5*  WBC 14.1* 9.8  PLT 265 212    COAGULATION  Recent Labs Lab 08/26/14 1625  INR 0.94    CARDIAC    Recent Labs Lab 08/27/14 0445 08/27/14 1011  TROPONINI <0.30 0.53*   No results for input(s): PROBNP in the last 168 hours.   CHEMISTRY  Recent Labs Lab 08/26/14 1625 08/27/14 0134 08/27/14 0410 08/27/14 1011 08/27/14 1200  NA 121* 127* 131* 130*  --   K <2.2* 2.4* 2.5* 2.6*  --   CL 66* 73* 77* 82*  --   CO2 41* 41* 33* 39*  --   GLUCOSE 138* 90 84 147*  --   BUN 26* 23 21 15   --   CREATININE 1.82* 1.30* 1.12* 0.74  --   CALCIUM 8.5 8.0* 7.7* 7.7*  --   MG 1.7 1.5  --  1.8  --   PHOS  --   --   --   --  3.4   Estimated Creatinine Clearance: 66.2 mL/min (by C-G formula based on Cr of 0.74).   LIVER  Recent Labs Lab 08/26/14 1625 08/27/14 0134  AST 90* 75*  ALT 28 23  ALKPHOS 368* 297*  BILITOT 0.7 0.5  PROT 7.0 5.8*  ALBUMIN 2.5* 1.9*  INR 0.94  --      INFECTIOUS  Recent Labs Lab 08/27/14 0445 08/27/14 1315  LATICACIDVEN 2.7* 1.5  PROCALCITON 0.57  --      ENDOCRINE CBG (last 3)   Recent Labs  08/26/14 2330 08/27/14 0844 08/27/14 1204  GLUCAP 78 112* 119*         IMAGING  x48h Ct Head Wo Contrast   08/26/2014   CLINICAL DATA:  Progressive left upper extremity weakness over the past 1 week.  EXAM: CT HEAD WITHOUT CONTRAST  TECHNIQUE: Contiguous axial images were obtained from the base of the skull through the vertex without intravenous contrast.  COMPARISON:  None.  FINDINGS: Mild age related cortical atrophy. Ventricular system normal in size and appearance for age. No mass lesion. No midline shift. No acute hemorrhage or hematoma. No extra-axial fluid collections. No evidence of acute infarction.  No skull fracture or other focal osseous abnormality involving the skull. Visualized paranasal sinuses, bilateral mastoid air cells and bilateral middle ear cavities well-aerated. Mild bilateral carotid siphon atherosclerosis.  IMPRESSION: 1. No acute intracranial abnormality. 2. Mild age related cortical atrophy.   Electronically Signed   By: Hulan Saas M.D.   On: 08/26/2014 17:04   Dg Chest Port 1 View  08/27/2014   CLINICAL DATA:  New left-sided PICC line.  EXAM: PORTABLE CHEST - 1 VIEW  COMPARISON:  08/27/2014  FINDINGS: Left-sided PICC line has been placed, tip overlying the level of the superior vena cava. There is increasing infiltrate in the left lung base. No evidence for pneumothorax. Persistent interstitial edema. Again noted is left posterior ninth rib fracture. No pneumothorax.  IMPRESSION: 1. Interval placement of left-sided PICC line. 2. Developing left lower lobe infiltrate.   Electronically Signed   By: Rosalie Gums M.D.   On: 08/27/2014 10:43   Dg Chest Port 1 View  08/27/2014   CLINICAL DATA:  68 year old female with respiratory failure  EXAM: PORTABLE CHEST - 1 VIEW  COMPARISON:  Prior chest x-ray 06/23/2014  FINDINGS: Patchy airspace infiltrate in the left mid lung and left retrocardiac region concerning for multifocal pneumonia. Stable mild cardiomegaly. Atherosclerotic calcification present in the transverse aorta. Difficult to exclude a small left  pleural effusion. No pneumothorax. Background of mild pulmonary vascular congestion bordering on edema. Surgical clips noted in the right axilla. No acute osseous abnormality.  IMPRESSION: 1. Patchy interstitial and airspace opacities in the left mid and lower lung  concerning for pneumonia. 2. Background of superimposed pulmonary vascular congestion bordering on mild interstitial edema. 3. Stable cardiomegaly. 4. Aortic atherosclerosis.   Electronically Signed   By: Malachy Moan M.D.   On: 08/27/2014 08:52        ASSESSMENT / PLAN:  PULMONARY OETT  A: Acute Hypoxemic REspioratory Failure in setting of LLL consolidation, sepsis picture and also Chronic opioid intake P:   o2 for pulse ox > 88% Bipap if awake enough Intubate if worse Check abg stat NEbs  CARDIOVASCULAR CVL LUE PICC + A: Circulatory shock, Likely septic Possible steress NSTEMI P:  Abx per ID section Chagne neo to levophed - map goal > 65 Hydrocort stress dose steroids Rule out MI - get ECHO and cycle enzymes  RENAL A:  AKI at admit 08/26/2014 in settin of ARB, NSAID, gabapentin, MS contin (latter 2 contribue to worsening encephalopathy) - resolved 08/27/14 with fluids  P:   No NSAID No ARB Hold off neurontin and MS Contin  GASTROINTESTINAL A:  Need for NPO due to mental status P:   NPO  HEMATOLOGIC A:   Anemia of chronic and critical illlness   P:  - PRBC for hgb </= 6.9gm%    - exceptions are   -  if ACS susepcted/confirmed then transfuse for hgb </= 8.0gm%,  or    -  active bleeding with hemodynamic instability, then transfuse regardless of hemoglobin value   At at all times try to transfuse 1 unit prbc as possible with exception of active hemorrhage    INFECTIOUS  A: High Pre-test prob septic shock due to PNA and /or UTI P:   Anti-infectives    Start     Dose/Rate Route Frequency Ordered Stop   08/27/14 2200  levofloxacin (LEVAQUIN) IVPB 500 mg     500 mg100 mL/hr over 60 Minutes  Intravenous Every 24 hours 08/27/14 0847     08/27/14 1000  vancomycin (VANCOCIN) IVPB 1000 mg/200 mL premix     1,000 mg200 mL/hr over 60 Minutes Intravenous Every 12 hours 08/27/14 0847     08/27/14 0900  imipenem-cilastatin (PRIMAXIN) 500 mg in sodium chloride 0.9 % 100 mL IVPB     500 mg200 mL/hr over 30 Minutes Intravenous Every 8 hours 08/27/14 0824     08/26/14 2300  levofloxacin (LEVAQUIN) IVPB 500 mg  Status:  Discontinued     500 mg100 mL/hr over 60 Minutes Intravenous Every 48 hours 08/26/14 2241 08/27/14 0847       ENDOCRINE A:  *Dm-2  P:   ICU hyperglycemia protocl  NEUROLOGIC A:  Acute Encephalopathy - currently RASS -2, Compounded by renal failure, circulatory shock, MS contin/gabapentin in setting of renal failure  P:   Hold off ambien, MS contin, neurontin Fentanyl prn only  RASS goal: 0 to -2 IF needed benzo IV prn Avoid benzo/opioid withdrawal MRI brain per [facility]   FAMILY  - Updates: none at bedside 08/27/14  - Inter-disciplinary family meet or Palliative Care meeting due by:  09/02/14   CODE    Code Status Orders        Start     Ordered   08/26/14 2146  Full code   Continuous     08/26/14 2145      GLOBAL PCCM and [facility] will co-manage through 08/28/14 atleast     The patient is critically ill with multiple organ systems failure and requires high complexity decision making for assessment and support, frequent evaluation and titration of therapies, application  of advanced monitoring technologies and extensive interpretation of multiple databases.   Critical Care Time devoted to patient care services described in this note is  60  Minutes. This time reflects time of care of this signee Dr Kalman Shan. This critical care time does not reflect procedure time, or teaching time or supervisory time of PA/NP/Med student/Med Resident etc but could involve care discussion time    Dr. Kalman Shan, M.D., [facility].C.P Pulmonary and Critical Care  Medicine Staff Physician [facility] Pager: [phone], If no answer or between  15:00h - 7:00h: call [phone]  08/27/2014 2:20 PM

## 2014-08-27 NOTE — Progress Notes (Signed)
RN requesting to change phenylephrine drip from single to quad strength and she's aware to change Alaris pump settings.  Thanks, Garnet Sierras, PharmD 08/27/2014

## 2014-08-27 NOTE — Progress Notes (Signed)
eLink Physician-Brief Progress Note Patient Name: [name] DOB: Feb 04, 1946 MRN: [id number]   Date of Service  08/27/2014  HPI/Events of Note  hypokalemia  eICU Interventions  replaced     Intervention Category Minor Interventions: Electrolytes abnormality - evaluation and management  Mauri Brooklyn, P 08/27/2014, 6:46 PM

## 2014-08-27 NOTE — Progress Notes (Signed)
Pt comfortable.  Resting, awakens easily.  Denies chest pain, shortness breath, nausea, vomiting  Current vitals--HR 90-110, RR 20--BP101/68--100% on NRB CV IRRR Lung--scattered rales Abd--soft/+BS  ABG--7.52/48/68/39 on NRB (was on it approx 5-10 min prior to ABG) CXR--LLL, LML opacity  vanco and imipenem added Blood cultures done as ordered  Suspect pnemonia, UTI both contributing to sepsis  DTat

## 2014-08-27 NOTE — Consult Note (Signed)
Reason for Consult: atrial fibrillation  Requesting Physician: Tat  Cardiologist: [place]  HPI: This is a 68 y.o. female with a past medical history significant for coronary disease (multiple angioplasty procedures of the first OM, stent RCA in 2000, cath 2010 with nonobstructive disease, normal perfusion study 11/15, normal LV systolic and diastolic function echo 35/59). Mild type 2 DM. She has a history of tobacco and Etoh abuse. She has chronic depression.   She has had problems with progressively worsening leg edema for the last three years, recently severe, despite diuretics (furosemide + metolazone) and support hose. Despite her negative cardiac workup, she has periodic chest discomfort related to emotional stress and chronic dyspnea. Recently her BP has been lower, leading to reduction in chronic clonidine therapy.   She presented with profound hypokalemia (K<2.2), hyponatremia (Na 121), acute renal failure (creat 1.8) and hypotension, still with marked bilateral leg edema to the thighs. She is being treating for sepsis presumed secondary to UTI and is on two IV pressors. She has received lots of IV fluids, but is only 2.7 L positive since admission due to abundant urine output. She reports marked dyspnea at rest, but is able to speak in uninterrupted sentences. Her CXR shows interstitial edema and a developing left lung infiltrate. ABG shows hypoxemia, hypercapnia and metabolic alkalosis.  At 3 AM she had abrupt onset of paroxysmal atrial fibrillation, with mildly elevated ventricular rate, that resolved spontaneously around 9AM. She has no history of arrhythmia and was oblivious to it while it was ongoing. Her K was 2.6 this morning. Mg 1.8.  Creatinine is back down to 0.74. Albumin is 1.5-1.9. Normal PT and bilirubin.  Her ECG shows marked widespread ST depression (present on 07/12/14 ECG as well) and marked QT prolongation. Cardiac troponin was normal on admission and has  risen a small amount this AM.  PMHx:  Past Medical History  Diagnosis Date  . Myocardial infarction 2000  . Diabetes mellitus type 2, insulin dependent   . COPD (chronic obstructive pulmonary disease)   . Hyperlipidemia   . Breast cancer   . OA (osteoarthritis) of knee   . Hypertension   . Coronary artery disease   . Asthma   . Elevated LFTs   . ETOH abuse   . Tobacco abuse   . Osteoarthritis    Past Surgical History  Procedure Laterality Date  . Breast surgery  1991    right mastectomy  . Vulvectomy  1981    partial  . Neck fusion    . Pelvic laparoscopy  2002    RSO  . Cesarean section  '78, '80, '81    x 3  . Hysteroscopy      D & C  . Cardiac catheterization  04/05/2009    EF 60%  . Lobectomy    . Tonsillectomy and adenoidectomy    . Mastectomy    . Coronary angioplasty  08/1998    x2 OF A BIFURCATION OM-1, OM-2 LESION  . Coronary angioplasty with stent placement  01/1999    MID FIRST OBTUSE MARGINAL VESSEL  . Coronary angioplasty with stent placement  07/1999    STENTING AT THE CRUX OF THE RIGHT CORONARY ARTERY WITH A 3.8MM X 18MM TETRA STENT  . Cardiovascular stress test  01/31/2005    EF 58%  . Total hip arthroplasty  08/2010    bilat    FAMHx: Family History  Problem Relation Age of Onset  . Diabetes Mother   .  Hypertension Father   . Heart disease Father   . Heart attack Father   . Stroke Father     SOCHx:  reports that she has been smoking Cigarettes.  She has a 15 pack-year smoking history. She has never used smokeless tobacco. She reports that she drinks alcohol. She reports that she does not use illicit drugs.  ALLERGIES: Allergies  Allergen Reactions  . Penicillins   . Zithromax [Azithromycin Dihydrate]     ORAL ULCERS     ROS: The patient specifically denies any chest pain at rest or with exertion, syncope, palpitations, focal neurological deficits, intermittent claudication, lower extremity edema, unexplained weight gain, cough,  hemoptysis or wheezing.  The patient also denies abdominal pain, nausea, vomiting, dysphagia, diarrhea, constipation, polyuria, polydipsia, dysuria, hematuria, frequency, urgency, abnormal bleeding or bruising, fever, chills, unexpected weight changes, mood swings, change in skin or hair texture, change in voice quality, auditory or visual problems, allergic reactions or rashes, new musculoskeletal complaints other than usual "aches and pains".   HOME MEDICATIONS: Prescriptions prior to admission  Medication Sig Dispense Refill Last Dose  . albuterol (PROVENTIL HFA;VENTOLIN HFA) 108 (90 BASE) MCG/ACT inhaler Inhale 2-4 puffs into the lungs every 6 (six) hours as needed for wheezing (wheezing).    08/25/2014 at Unknown time  . BIOTIN PO Take 1 tablet by mouth daily.    Past Month at Unknown time  . bisoprolol (ZEBETA) 10 MG tablet Take 5 mg by mouth daily.  0 08/26/2014 at 0930  . diclofenac (VOLTAREN) 75 MG EC tablet Take 1 tablet by mouth 2 (two) times daily as needed for mild pain or moderate pain (pain).    08/26/2014 at Unknown time  . furosemide (LASIX) 40 MG tablet Take 1 tablet (40 mg total) by mouth 2 (two) times daily. 180 tablet 3 08/26/2014 at Unknown time  . gabapentin (NEURONTIN) 600 MG tablet Take 600 mg by mouth 3 (three) times daily.    08/26/2014 at Unknown time  . Ipratropium Bromide HFA (ATROVENT HFA IN) Inhale 17 mcg into the lungs at bedtime.    08/25/2014 at Unknown time  . JANUVIA 25 MG tablet Take 1 tablet by mouth daily.   08/26/2014 at Unknown time  . losartan (COZAAR) 25 MG tablet Take 1 tablet (25 mg total) by mouth daily. 30 tablet 0 08/26/2014 at Unknown time  . metolazone (ZAROXOLYN) 2.5 MG tablet Take 1 tablet (2.5 mg total) by mouth daily. (Patient taking differently: Take 2.5 mg by mouth every other day. ) 90 tablet 2 08/25/2014 at Unknown time  . morphine (MS CONTIN) 30 MG 12 hr tablet Take 30 mg by mouth 4 (four) times daily.   0 08/26/2014 at Unknown time  .  nicotine (NICODERM CQ - DOSED IN MG/24 HOURS) 21 mg/24hr patch Place 1 patch onto the skin daily.     08/26/2014 at Unknown time  . oxyCODONE (ROXICODONE) 15 MG immediate release tablet Take 15 mg by mouth every 6 (six) hours as needed.     08/26/2014 at Unknown time  . simvastatin (ZOCOR) 10 MG tablet Take 10 mg by mouth every evening.   08/26/2014 at Unknown time  . albuterol (PROVENTIL) (2.5 MG/3ML) 0.083% nebulizer solution Take 2.5 mg by nebulization every 6 (six) hours as needed.     unknown at unknown time  . ALPRAZolam (XANAX) 0.25 MG tablet Take one tablet daily or as needed 90 tablet 3 unknown at unknown time  . morphine (KADIAN) 30 MG 24 hr capsule Take  30 mg by mouth daily.     unknown at unknown time  . nitroGLYCERIN (NITROSTAT) 0.4 MG SL tablet Place 1 tablet (0.4 mg total) under the tongue as needed. 90 tablet 6 unknown at unknown time  . simvastatin (ZOCOR) 10 MG tablet Take 1 tablet (10 mg total) by mouth every evening. (Patient not taking: Reported on 08/26/2014) 90 tablet 1     HOSPITAL MEDICATIONS: I have reviewed the patient's current medications. Prior to Admission:  Prescriptions prior to admission  Medication Sig Dispense Refill Last Dose  . albuterol (PROVENTIL HFA;VENTOLIN HFA) 108 (90 BASE) MCG/ACT inhaler Inhale 2-4 puffs into the lungs every 6 (six) hours as needed for wheezing (wheezing).    08/25/2014 at Unknown time  . BIOTIN PO Take 1 tablet by mouth daily.    Past Month at Unknown time  . bisoprolol (ZEBETA) 10 MG tablet Take 5 mg by mouth daily.  0 08/26/2014 at 0930  . diclofenac (VOLTAREN) 75 MG EC tablet Take 1 tablet by mouth 2 (two) times daily as needed for mild pain or moderate pain (pain).    08/26/2014 at Unknown time  . furosemide (LASIX) 40 MG tablet Take 1 tablet (40 mg total) by mouth 2 (two) times daily. 180 tablet 3 08/26/2014 at Unknown time  . gabapentin (NEURONTIN) 600 MG tablet Take 600 mg by mouth 3 (three) times daily.    08/26/2014 at  Unknown time  . Ipratropium Bromide HFA (ATROVENT HFA IN) Inhale 17 mcg into the lungs at bedtime.    08/25/2014 at Unknown time  . JANUVIA 25 MG tablet Take 1 tablet by mouth daily.   08/26/2014 at Unknown time  . losartan (COZAAR) 25 MG tablet Take 1 tablet (25 mg total) by mouth daily. 30 tablet 0 08/26/2014 at Unknown time  . metolazone (ZAROXOLYN) 2.5 MG tablet Take 1 tablet (2.5 mg total) by mouth daily. (Patient taking differently: Take 2.5 mg by mouth every other day. ) 90 tablet 2 08/25/2014 at Unknown time  . morphine (MS CONTIN) 30 MG 12 hr tablet Take 30 mg by mouth 4 (four) times daily.   0 08/26/2014 at Unknown time  . nicotine (NICODERM CQ - DOSED IN MG/24 HOURS) 21 mg/24hr patch Place 1 patch onto the skin daily.     08/26/2014 at Unknown time  . oxyCODONE (ROXICODONE) 15 MG immediate release tablet Take 15 mg by mouth every 6 (six) hours as needed.     08/26/2014 at Unknown time  . simvastatin (ZOCOR) 10 MG tablet Take 10 mg by mouth every evening.   08/26/2014 at Unknown time  . albuterol (PROVENTIL) (2.5 MG/3ML) 0.083% nebulizer solution Take 2.5 mg by nebulization every 6 (six) hours as needed.     unknown at unknown time  . ALPRAZolam (XANAX) 0.25 MG tablet Take one tablet daily or as needed 90 tablet 3 unknown at unknown time  . morphine (KADIAN) 30 MG 24 hr capsule Take 30 mg by mouth daily.     unknown at unknown time  . nitroGLYCERIN (NITROSTAT) 0.4 MG SL tablet Place 1 tablet (0.4 mg total) under the tongue as needed. 90 tablet 6 unknown at unknown time  . simvastatin (ZOCOR) 10 MG tablet Take 1 tablet (10 mg total) by mouth every evening. (Patient not taking: Reported on 08/26/2014) 90 tablet 1    Scheduled: . antiseptic oral rinse  7 mL Mouth Rinse BID  . hydrocortisone sod succinate (SOLU-CORTEF) inj  50 mg Intravenous Q6H  . imipenem-cilastatin  500 mg Intravenous Q8H  . [START ON 08/28/2014] Influenza vac split quadrivalent PF  0.5 mL Intramuscular Tomorrow-1000  .  insulin aspart  0-5 Units Subcutaneous QHS  . insulin aspart  0-9 Units Subcutaneous TID WC  . ipratropium-albuterol  3 mL Nebulization Q6H  . levofloxacin (LEVAQUIN) IV  500 mg Intravenous Q24H  . nicotine  21 mg Transdermal Q24H  . [START ON 08/28/2014] pneumococcal 23 valent vaccine  0.5 mL Intramuscular Tomorrow-1000  . simvastatin  10 mg Oral QPM  . sodium chloride  10-40 mL Intracatheter Q12H  . sodium chloride  3 mL Intravenous Q12H  . vancomycin  1,000 mg Intravenous Q12H   Continuous: . norepinephrine (LEVOPHED) Adult infusion 10 mcg/min (08/27/14 1436)    VITALS: Blood pressure 128/87, pulse 68, temperature 98.3 F (36.8 C), temperature source Oral, resp. rate 18, height 5\' 3"  (1.6 m), weight 160 lb 7.9 oz (72.8 kg), SpO2 94 %.  PHYSICAL EXAM:  General: Alert, oriented x3, no distress Head: no evidence of trauma, PERRL, EOMI, no exophtalmos or lid lag, no myxedema, no xanthelasma; normal ears, nose and oropharynx Neck: 4-5 cm jugular venous pulsations and no hepatojugular reflux; brisk carotid pulses without delay and no carotid bruits Chest: clear to auscultation, no signs of consolidation by percussion or palpation, normal fremitus, symmetrical and full respiratory excursions Cardiovascular: normal position and quality of the apical impulse, regular rhythm, normal first heart sound and normal second heart sound, no rubs or gallops, no murmur Abdomen: no tenderness or distention, no masses by palpation, no abnormal pulsatility or arterial bruits, normal bowel sounds, no hepatosplenomegaly Extremities: no clubbing, cyanosis;  3-4+ pitting edema to the thighs; 2+ radial, ulnar and brachial pulses bilaterally; 2+ right femoral, posterior tibial and dorsalis pedis pulses; 2+ left femoral, posterior tibial and dorsalis pedis pulses; no subclavian or femoral bruits Neurological: grossly nonfocal   LABS  CBC  Recent Labs  08/26/14 1625 08/27/14 0134  WBC 14.1* 9.8    NEUTROABS 11.5* 7.4  HGB 11.1* 9.7*  HCT 31.1* 27.5*  MCV 101.3* 100.7*  PLT 265 867   Basic Metabolic Panel  Recent Labs  08/27/14 0134 08/27/14 0410 08/27/14 1011 08/27/14 1200  NA 127* 131* 130*  --   K 2.4* 2.5* 2.6*  --   CL 73* 77* 82*  --   CO2 41* 33* 39*  --   GLUCOSE 90 84 147*  --   BUN 23 21 15   --   CREATININE 1.30* 1.12* 0.74  --   CALCIUM 8.0* 7.7* 7.7*  --   MG 1.5  --  1.8  --   PHOS  --   --   --  3.4   Liver Function Tests  Recent Labs  08/26/14 1625 08/27/14 0134  AST 90* 75*  ALT 28 23  ALKPHOS 368* 297*  BILITOT 0.7 0.5  PROT 7.0 5.8*  ALBUMIN 2.5* 1.9*   No results for input(s): LIPASE, AMYLASE in the last 72 hours. Cardiac Enzymes  Recent Labs  08/27/14 0445 08/27/14 1011  TROPONINI <0.30 0.53*     Recent Labs  08/26/14 2256  TSH 2.120      IMAGING: Ct Head Wo Contrast  08/26/2014   CLINICAL DATA:  Progressive left upper extremity weakness over the past 1 week.  EXAM: CT HEAD WITHOUT CONTRAST  TECHNIQUE: Contiguous axial images were obtained from the base of the skull through the vertex without intravenous contrast.  COMPARISON:  None.  FINDINGS: Mild age related cortical atrophy.  Ventricular system normal in size and appearance for age. No mass lesion. No midline shift. No acute hemorrhage or hematoma. No extra-axial fluid collections. No evidence of acute infarction.  No skull fracture or other focal osseous abnormality involving the skull. Visualized paranasal sinuses, bilateral mastoid air cells and bilateral middle ear cavities well-aerated. Mild bilateral carotid siphon atherosclerosis.  IMPRESSION: 1. No acute intracranial abnormality. 2. Mild age related cortical atrophy.   Electronically Signed   By: Evangeline Dakin M.D.   On: 08/26/2014 17:04   Dg Chest Port 1 View  08/27/2014   CLINICAL DATA:  New left-sided PICC line.  EXAM: PORTABLE CHEST - 1 VIEW  COMPARISON:  08/27/2014  FINDINGS: Left-sided PICC line has been  placed, tip overlying the level of the superior vena cava. There is increasing infiltrate in the left lung base. No evidence for pneumothorax. Persistent interstitial edema. Again noted is left posterior ninth rib fracture. No pneumothorax.  IMPRESSION: 1. Interval placement of left-sided PICC line. 2. Developing left lower lobe infiltrate.   Electronically Signed   By: Shon Hale M.D.   On: 08/27/2014 10:43   Dg Chest Port 1 View  08/27/2014   CLINICAL DATA:  68 year old female with respiratory failure  EXAM: PORTABLE CHEST - 1 VIEW  COMPARISON:  Prior chest x-ray 06/23/2014  FINDINGS: Patchy airspace infiltrate in the left mid lung and left retrocardiac region concerning for multifocal pneumonia. Stable mild cardiomegaly. Atherosclerotic calcification present in the transverse aorta. Difficult to exclude a small left pleural effusion. No pneumothorax. Background of mild pulmonary vascular congestion bordering on edema. Surgical clips noted in the right axilla. No acute osseous abnormality.  IMPRESSION: 1. Patchy interstitial and airspace opacities in the left mid and lower lung concerning for pneumonia. 2. Background of superimposed pulmonary vascular congestion bordering on mild interstitial edema. 3. Stable cardiomegaly. 4. Aortic atherosclerosis.   Electronically Signed   By: Jacqulynn Cadet M.D.   On: 08/27/2014 08:52    ECG: NSR to AF, back to NSR  marked widespread ST depression (present on 07/12/14 ECG as well) and marked QT prolongation  TELEMETRY: AF with mild RVR 3-9 AM today, otherwise NSR, occasional PACs  IMPRESSION/RECOMMENDATION: 1. PAF - During critical illness and severe electrolyte abnormalities, not necessarily likely to become a chronic problem. Episode was brief and self terminated. Would not start anticoagulants unless it returns. Recent workup negative for structural heart disease 2. Abnormal cTrop I - marginal increase, not surprising in a patient with CAD and profound  hypotension requiring pressors. Suspect demand ischemia 3. Severe edema - appears to be noncardiac. Consider further w/u for liver disease. 4. CAD s/p remote PCI, normal recent nuclear stress test 5. COPD - ABG suggests chronic hypoventilation with metabolic compensation; possible left lung pneumonia, on broad antibiotics. This appears a more likely cause of sepsis     Time Spent Directly with Patient: 60 minutes  Sanda Klein, MD, [facility] [phone] office [phone] pager   08/27/2014, 2:57 PM

## 2014-08-27 NOTE — Progress Notes (Signed)
CRITICAL VALUE ALERT  Critical value received: troponin 0.53, K+ 2.6  Date of notification:  08/27/2014  Time of notification:  11:44  Critical value read back:yes  Nurse who received alert: Lacinda Axon RN  MD notified (1st page):  Dr. Carles Collet  Time of first page:  11:45    Responding MD:  Dr. Carles Collet  Time MD responded:  11:49

## 2014-08-27 NOTE — Progress Notes (Signed)
ANTIBIOTIC CONSULT NOTE - INITIAL  Pharmacy Consult for levofloxacin Indication: UTI  Allergies  Allergen Reactions  . Penicillins   . Zithromax [Azithromycin Dihydrate]     ORAL ULCERS     Patient Measurements: Height: 5\' 3"  (160 cm) Weight: 160 lb 7.9 oz (72.8 kg) IBW/kg (Calculated) : 52.4 Adjusted Body Weight:   Vital Signs: Temp: 97.6 F (36.4 C) (12/13 0400) Temp Source: Oral (12/13 0400) BP: 114/46 mmHg (12/13 0530) Pulse Rate: 47 (12/13 0530) Intake/Output from previous day: 12/12 0701 - 12/13 0700 In: 2900 [I.V.:800; IV Piggyback:2100] Out: 2550 [Urine:2550] Intake/Output from this shift: Total I/O In: 2900 [I.V.:800; IV Piggyback:2100] Out: 2050 [Urine:2050]  Labs:  Recent Labs  08/26/14 1625 08/27/14 0134  WBC 14.1* 9.8  HGB 11.1* 9.7*  PLT 265 212  CREATININE 1.82* 1.30*   Estimated Creatinine Clearance: 40.7 mL/min (by C-G formula based on Cr of 1.3). No results for input(s): VANCOTROUGH, VANCOPEAK, VANCORANDOM, GENTTROUGH, GENTPEAK, GENTRANDOM, TOBRATROUGH, TOBRAPEAK, TOBRARND, AMIKACINPEAK, AMIKACINTROU, AMIKACIN in the last 72 hours.   Microbiology: Recent Results (from the past 720 hour(s))  MRSA PCR Screening     Status: None   Collection Time: 08/26/14  9:40 PM  Result Value Ref Range Status   MRSA by PCR NEGATIVE NEGATIVE Final    Comment:        The GeneXpert MRSA Assay (FDA approved for NASAL specimens only), is one component of a comprehensive MRSA colonization surveillance program. It is not intended to diagnose MRSA infection nor to guide or monitor treatment for MRSA infections.     Medical History: Past Medical History  Diagnosis Date  . Myocardial infarction 2000  . Diabetes mellitus type 2, insulin dependent   . COPD (chronic obstructive pulmonary disease)   . Hyperlipidemia   . Breast cancer   . OA (osteoarthritis) of knee   . Hypertension   . Coronary artery disease   . Asthma   . Elevated LFTs   . ETOH  abuse   . Tobacco abuse   . Osteoarthritis     Medications:  Anti-infectives    Start     Dose/Rate Route Frequency Ordered Stop   08/26/14 2300  levofloxacin (LEVAQUIN) IVPB 500 mg     500 mg100 mL/hr over 60 Minutes Intravenous Every 48 hours 08/26/14 2241       Assessment: Patient with UTI.  Patient with poor renal function.  Goal of Therapy:  Levofloxacin dosed based on patient weight and renal function   Plan:  Follow up culture results  Levofloxacin 500mg  iv q48hr  [name] 08/27/2014,6:02 AM

## 2014-08-27 NOTE — Progress Notes (Signed)
ANTIBIOTIC CONSULT NOTE - INITIAL  Pharmacy Consult for Levofloxacin, Vancomycin Indication: UTI, sepsis  Allergies  Allergen Reactions  . Penicillins   . Zithromax [Azithromycin Dihydrate]     ORAL ULCERS     Patient Measurements: Height: 5\' 3"  (160 cm) Weight: 160 lb 7.9 oz (72.8 kg) IBW/kg (Calculated) : 52.4   Vital Signs: Temp: 97.6 F (36.4 C) (12/13 0400) Temp Source: Oral (12/13 0400) BP: 85/51 mmHg (12/13 0800) Pulse Rate: 128 (12/13 0800) Intake/Output from previous day: 12/12 0701 - 12/13 0700 In: 2426 [I.V.:1155; IV Piggyback:2100] Out: 2550 [Urine:2550] Intake/Output from this shift: Total I/O In: 245 [I.V.:245] Out: -   Labs:  Recent Labs  08/26/14 1625 08/27/14 0134 08/27/14 0410  WBC 14.1* 9.8  --   HGB 11.1* 9.7*  --   PLT 265 212  --   CREATININE 1.82* 1.30* 1.12*   Estimated Creatinine Clearance: 47.3 mL/min (by C-G formula based on Cr of 1.12). No results for input(s): VANCOTROUGH, VANCOPEAK, VANCORANDOM, GENTTROUGH, GENTPEAK, GENTRANDOM, TOBRATROUGH, TOBRAPEAK, TOBRARND, AMIKACINPEAK, AMIKACINTROU, AMIKACIN in the last 72 hours.   Microbiology: Recent Results (from the past 720 hour(s))  MRSA PCR Screening     Status: None   Collection Time: 08/26/14  9:40 PM  Result Value Ref Range Status   MRSA by PCR NEGATIVE NEGATIVE Final    Comment:        The GeneXpert MRSA Assay (FDA approved for NASAL specimens only), is one component of a comprehensive MRSA colonization surveillance program. It is not intended to diagnose MRSA infection nor to guide or monitor treatment for MRSA infections.     Medical History: Past Medical History  Diagnosis Date  . Myocardial infarction 2000  . Diabetes mellitus type 2, insulin dependent   . COPD (chronic obstructive pulmonary disease)   . Hyperlipidemia   . Breast cancer   . OA (osteoarthritis) of knee   . Hypertension   . Coronary artery disease   . Asthma   . Elevated LFTs   . ETOH  abuse   . Tobacco abuse   . Osteoarthritis     Medications:  Anti-infectives    Start     Dose/Rate Route Frequency Ordered Stop   08/27/14 0900  imipenem-cilastatin (PRIMAXIN) 500 mg in sodium chloride 0.9 % 100 mL IVPB     500 mg200 mL/hr over 30 Minutes Intravenous Every 8 hours 08/27/14 0824     08/26/14 2300  levofloxacin (LEVAQUIN) IVPB 500 mg     500 mg100 mL/hr over 60 Minutes Intravenous Every 48 hours 08/26/14 2241       Assessment: 68yo F admitted w/ L arm weakness, numbness and tingling in fingers. Found to have severe hypokalemia, hyponatremia and ARF. Started on Levaquin for possible UTI per pharmacy protocol. Hypotension requiring pressors. Antibiotics broadened for suspected sepsis.  12/13 >> Levofloxacin >> 12/13 >> Primaxin>> 12/13 >> Vanc >>  Tmax: AF WBCs: improved overnight to wnl Renal: SCr 1.12, improving with hydration, CrCl 47CG, 56N  / blood x 2:  12/12 urine: sent  Goal of Therapy:  Vanc trough 15-20mg /L Appropriate antibiotic dosing for renal function; eradication of infection  Plan:  Start Vancomycin 1g IV q12h. Change Levaquin to 500mg  IV q24h. Agree with Primaxin 500mg  IV q8h. Measure Vanc trough at steady state. Follow up renal fxn and culture results.  Romeo Rabon, PharmD, pager [phone]. 08/27/2014,8:46 AM.

## 2014-08-27 NOTE — Progress Notes (Signed)
Peripherally Inserted Central Catheter/Midline Placement  The IV Nurse has discussed with the patient and/or persons authorized to consent for the patient, the purpose of this procedure and the potential benefits and risks involved with this procedure.  The benefits include less needle sticks, lab draws from the catheter and patient may be discharged home with the catheter.  Risks include, but not limited to, infection, bleeding, blood clot (thrombus formation), and puncture of an artery; nerve damage and irregular heat beat.  Alternatives to this procedure were also discussed.  PICC/Midline Placement Documentation  PICC Triple Lumen 08/27/14 PICC Left Brachial 39 cm 0 cm (Active)  Indication for Insertion or Continuance of Line Vasoactive infusions 08/27/2014 10:07 AM  Exposed Catheter (cm) 0 cm 08/27/2014 10:07 AM  Site Assessment Clean;Intact;Dry 08/27/2014 10:07 AM  Lumen #1 Status Flushed;Saline locked;Blood return noted 08/27/2014 10:07 AM  Lumen #2 Status Flushed;Saline locked;Blood return noted 08/27/2014 10:07 AM  Lumen #3 Status Flushed;Saline locked;Blood return noted 08/27/2014 10:07 AM  Dressing Type Transparent 08/27/2014 10:07 AM  Dressing Status Clean;Dry;Intact;Antimicrobial disc in place 08/27/2014 10:07 AM  Line Care Connections checked and tightened 08/27/2014 10:07 AM  Dressing Intervention New dressing 08/27/2014 10:07 AM  Dressing Change Due 09/03/14 08/27/2014 10:07 AM       Rolena Infante 08/27/2014, 10:08 AM

## 2014-08-27 NOTE — Progress Notes (Signed)
eLink Physician-Brief Progress Note Patient Name: [name] DOB: 1946/07/16 MRN: [id number]   Date of Service  08/27/2014  HPI/Events of Note  Shock with presumed sepsis. Will give more volume,  Start phenylephrine via piv if necessary  eICU Interventions       Intervention Category Major Interventions: Shock - evaluation and management  Macgregor Aeschliman S. 08/27/2014, 3:54 AM

## 2014-08-27 NOTE — Progress Notes (Signed)
PROGRESS NOTE  [name] WJX:[id number] DOB: [birth year] DOA: 08/26/2014 PCP: Redmond Baseman, MD  Brief history 68 year old female with history of coronary artery disease, diabetes mellitus type 2, COPD, hyperlipidemia, tobacco use presented to the emergency department on 08/26/2014 because of left approximate weakness and dysesthesias. The patient was found to be hypotensive with acute kidney injury. In addition, she was hyponatremic with a sodium of 121 and hypokalemic with potassium of less than 2.2. The patient was admitted to the ICU where the patient continued to be hypotensive. The patient was started on Neo-Synephrine after she received over 4 L of normal saline. The patient subsequently developed hypoxemia and was placed on a nonrebreather. Assessment/Plan: Sepsis -Likely due to urinary source -The patient received a dose of levofloxacin on 08/26/2014 @2340  -Unfortunately, blood cultures were not previously obtained -Blood cultures 2 sets -Broaden antibiotic coverage with vancomycin and imipenem as the patient has penicillin allergy -I have consulted critical care--spoke with Dr. Marchelle Gearing -Procalcitonin 0.57 Acute respiratory failure -Suspect due to volume overload -I have ordered ABG and chest x-ray -Patient was placed on nonrebreather -d/c IVF Atrial fibrillation with RVR -Likely due to the patient's sepsis -07/27/2014 Lexiscan was normal with normal EF -07/27/2014 echocardiogram EF 65-70 percent -HR 120-130s -I have consulted cardiology--spoke with Dr. Johney Frame AKI -due to sepsis and hemodynamic changes -improved with IVF -monitor electrolyes Hyponatremia -due to volume depletion and zaroxolyn -pt was also on lasix -all diuretics on hold -stop NS for now to avoid risk of osmotic demyelination syndrome as the patient's sodium has increased from 121-131 -recheck BMP later today Hypokalemia -Replete Diabetes mellitus type II -NovoLog sliding  scale Left arm weakness -MRI brain when the patient is clinically stable -This has been present for one week prior to admission Chronic pain with chronic opioid dependence -Decrease opioids, but were not discontinued altogether to avoid withdrawal   Family Communication:   Pt at beside Disposition Plan:   Home when medically stable     Antibiotics:  Levofloxacin 08/26/2014>>>  Vancomycin 08/27/2014  Imipenem 08/27/2014>>>    Procedures/Studies: Ct Head Wo Contrast  08/26/2014   CLINICAL DATA:  Progressive left upper extremity weakness over the past 1 week.  EXAM: CT HEAD WITHOUT CONTRAST  TECHNIQUE: Contiguous axial images were obtained from the base of the skull through the vertex without intravenous contrast.  COMPARISON:  None.  FINDINGS: Mild age related cortical atrophy. Ventricular system normal in size and appearance for age. No mass lesion. No midline shift. No acute hemorrhage or hematoma. No extra-axial fluid collections. No evidence of acute infarction.  No skull fracture or other focal osseous abnormality involving the skull. Visualized paranasal sinuses, bilateral mastoid air cells and bilateral middle ear cavities well-aerated. Mild bilateral carotid siphon atherosclerosis.  IMPRESSION: 1. No acute intracranial abnormality. 2. Mild age related cortical atrophy.   Electronically Signed   By: Hulan Saas M.D.   On: 08/26/2014 17:04         Subjective: Patient is awake and alert but pleasantly confused. She is not aware that she is in the hospital and does not know why she is in the hospital. She presently denies any fevers, chills, chest pain, short of breath, nausea, vomiting, abdominal pain. No diarrhea.  Objective: Filed Vitals:   08/27/14 0530 08/27/14 0600 08/27/14 0700 08/27/14 0800  BP: 114/46 103/48 94/59 85/51   Pulse: 47 76 94 128  Temp:      TempSrc:  Resp: 18 15 14 15   Height:      Weight:      SpO2: 84% 98% 91% 94%    Intake/Output  Summary (Last 24 hours) at 08/27/14 0830 Last data filed at 08/27/14 0800  Gross per 24 hour  Intake   3500 ml  Output   2550 ml  Net    950 ml   Weight change:  Exam:   General:  Pt is alert, follows commands appropriately  HEENT: No icterus, No thrush,  [place]/AT  Cardiovascular: IRRR, S1/S2, no rubs, no gallops  Respiratory: Bibasilar rales. No wheezing.  Abdomen: Soft/+BS, non tender, non distended, no guarding  Extremities: 1+LE edema, No lymphangitis, No petechiae, No rashes, no synovitis  Data Reviewed: Basic Metabolic Panel:  Recent Labs Lab 08/26/14 1625 08/27/14 0134  NA 121* 127*  K <2.2* 2.4*  CL 66* 73*  CO2 41* 41*  GLUCOSE 138* 90  BUN 26* 23  CREATININE 1.82* 1.30*  CALCIUM 8.5 8.0*  MG 1.7 1.5   Liver Function Tests:  Recent Labs Lab 08/26/14 1625 08/27/14 0134  AST 90* 75*  ALT 28 23  ALKPHOS 368* 297*  BILITOT 0.7 0.5  PROT 7.0 5.8*  ALBUMIN 2.5* 1.9*   No results for input(s): LIPASE, AMYLASE in the last 168 hours. No results for input(s): AMMONIA in the last 168 hours. CBC:  Recent Labs Lab 08/26/14 1625 08/27/14 0134  WBC 14.1* 9.8  NEUTROABS 11.5* 7.4  HGB 11.1* 9.7*  HCT 31.1* 27.5*  MCV 101.3* 100.7*  PLT 265 212   Cardiac Enzymes:  Recent Labs Lab 08/27/14 0445  TROPONINI <0.30   BNP: Invalid input(s): POCBNP CBG:  Recent Labs Lab 08/26/14 2328 08/26/14 2330  GLUCAP 46* 78    Recent Results (from the past 240 hour(s))  MRSA PCR Screening     Status: None   Collection Time: 08/26/14  9:40 PM  Result Value Ref Range Status   MRSA by PCR NEGATIVE NEGATIVE Final    Comment:        The GeneXpert MRSA Assay (FDA approved for NASAL specimens only), is one component of a comprehensive MRSA colonization surveillance program. It is not intended to diagnose MRSA infection nor to guide or monitor treatment for MRSA infections.      Scheduled Meds: . antiseptic oral rinse  7 mL Mouth Rinse BID  .  imipenem-cilastatin  500 mg Intravenous Q8H  . [START ON 08/28/2014] Influenza vac split quadrivalent PF  0.5 mL Intramuscular Tomorrow-1000  . insulin aspart  0-5 Units Subcutaneous QHS  . insulin aspart  0-9 Units Subcutaneous TID WC  . levofloxacin (LEVAQUIN) IV  500 mg Intravenous Q48H  . morphine  30 mg Oral 4 times per day  . nicotine  21 mg Transdermal Q24H  . [START ON 08/28/2014] pneumococcal 23 valent vaccine  0.5 mL Intramuscular Tomorrow-1000  . potassium chloride  40 mEq Oral Q4H  . simvastatin  10 mg Oral QPM  . sodium chloride  3 mL Intravenous Q12H   Continuous Infusions: . phenylephrine (NEO-SYNEPHRINE) Adult infusion 80 mcg/min (08/27/14 0800)     Chiann Goffredo, DO  [facility] Pager [phone]  If 7PM-7AM, please contact night-coverage [email or web address] Password [email or web address] 08/27/2014, 8:30 AM   LOS: 1 day

## 2014-08-28 ENCOUNTER — Inpatient Hospital Stay (HOSPITAL_COMMUNITY): Payer: BC Managed Care – PPO

## 2014-08-28 DIAGNOSIS — I5031 Acute diastolic (congestive) heart failure: Secondary | ICD-10-CM

## 2014-08-28 DIAGNOSIS — I379 Nonrheumatic pulmonary valve disorder, unspecified: Secondary | ICD-10-CM

## 2014-08-28 DIAGNOSIS — J9690 Respiratory failure, unspecified, unspecified whether with hypoxia or hypercapnia: Secondary | ICD-10-CM | POA: Insufficient documentation

## 2014-08-28 DIAGNOSIS — J9601 Acute respiratory failure with hypoxia: Secondary | ICD-10-CM

## 2014-08-28 LAB — GLUCOSE, CAPILLARY
GLUCOSE-CAPILLARY: 168 mg/dL — AB (ref 70–99)
GLUCOSE-CAPILLARY: 139 mg/dL — AB (ref 70–99)
GLUCOSE-CAPILLARY: 107 mg/dL — AB (ref 70–99)
Glucose-Capillary: 176 mg/dL — ABNORMAL HIGH (ref 70–99)
Glucose-Capillary: 223 mg/dL — ABNORMAL HIGH (ref 70–99)
Glucose-Capillary: 97 mg/dL (ref 70–99)

## 2014-08-28 LAB — CBC WITH DIFFERENTIAL/PLATELET
BASOS ABS: 0 10*3/uL (ref 0.0–0.1)
Basophils Relative: 0 % (ref 0–1)
EOS PCT: 0 % (ref 0–5)
Eosinophils Absolute: 0 10*3/uL (ref 0.0–0.7)
HCT: 28 % — ABNORMAL LOW (ref 36.0–46.0)
Hemoglobin: 9.8 g/dL — ABNORMAL LOW (ref 12.0–15.0)
Lymphocytes Relative: 6 % — ABNORMAL LOW (ref 12–46)
Lymphs Abs: 0.7 10*3/uL (ref 0.7–4.0)
MCH: 36 pg — ABNORMAL HIGH (ref 26.0–34.0)
MCHC: 35 g/dL (ref 30.0–36.0)
MCV: 102.9 fL — AB (ref 78.0–100.0)
Monocytes Absolute: 0.6 10*3/uL (ref 0.1–1.0)
Monocytes Relative: 5 % (ref 3–12)
NEUTROS ABS: 11.3 10*3/uL — AB (ref 1.7–7.7)
Neutrophils Relative %: 89 % — ABNORMAL HIGH (ref 43–77)
Platelets: 267 10*3/uL (ref 150–400)
RBC: 2.72 MIL/uL — ABNORMAL LOW (ref 3.87–5.11)
RDW: 13.3 % (ref 11.5–15.5)
WBC: 12.7 10*3/uL — AB (ref 4.0–10.5)

## 2014-08-28 LAB — CORTISOL: CORTISOL PLASMA: 20.7 ug/dL

## 2014-08-28 LAB — BASIC METABOLIC PANEL
Anion gap: 8 (ref 5–15)
BUN: 8 mg/dL (ref 6–23)
CALCIUM: 8.5 mg/dL (ref 8.4–10.5)
CO2: 36 meq/L — AB (ref 19–32)
Chloride: 87 mEq/L — ABNORMAL LOW (ref 96–112)
Creatinine, Ser: 0.54 mg/dL (ref 0.50–1.10)
GFR calc Af Amer: >90 mL/min (ref >90)
GFR calc non Af Amer: >90 mL/min (ref >90)
Glucose, Bld: 162 mg/dL — ABNORMAL HIGH (ref 70–99)
Potassium: 3.5 mEq/L — ABNORMAL LOW (ref 3.7–5.3)
SODIUM: 131 meq/L — AB (ref 137–147)

## 2014-08-28 LAB — PRO B NATRIURETIC PEPTIDE: PRO B NATRI PEPTIDE: 8165 pg/mL — AB (ref 0–125)

## 2014-08-28 LAB — PHOSPHORUS: PHOSPHORUS: 1.9 mg/dL — AB (ref 2.3–4.6)

## 2014-08-28 LAB — MAGNESIUM: MAGNESIUM: 1.9 mg/dL (ref 1.5–2.5)

## 2014-08-28 LAB — PROCALCITONIN: PROCALCITONIN: 0.28 ng/mL

## 2014-08-28 LAB — LACTIC ACID, PLASMA: Lactic Acid, Venous: 1.8 mmol/L (ref 0.5–2.2)

## 2014-08-28 MED ORDER — FUROSEMIDE 10 MG/ML IJ SOLN
40.0000 mg | Freq: Two times a day (BID) | INTRAMUSCULAR | Status: DC
  Administered 2014-08-28 – 2014-08-29 (×4): 40 mg via INTRAVENOUS
  Filled 2014-08-28 (×4): qty 4

## 2014-08-28 MED ORDER — MORPHINE SULFATE ER 30 MG PO TBCR
30.0000 mg | EXTENDED_RELEASE_TABLET | Freq: Two times a day (BID) | ORAL | Status: DC
Start: 2014-08-28 — End: 2014-08-29
  Administered 2014-08-28 (×2): 30 mg via ORAL
  Filled 2014-08-28 (×2): qty 1

## 2014-08-28 MED ORDER — POTASSIUM CHLORIDE CRYS ER 20 MEQ PO TBCR
20.0000 meq | EXTENDED_RELEASE_TABLET | Freq: Three times a day (TID) | ORAL | Status: DC
  Administered 2014-08-28 – 2014-08-29 (×6): 20 meq via ORAL
  Filled 2014-08-28 (×6): qty 1

## 2014-08-28 MED ORDER — OXYCODONE HCL 5 MG PO TABS
5.0000 mg | ORAL_TABLET | Freq: Four times a day (QID) | ORAL | Status: DC | PRN
  Administered 2014-08-28 – 2014-08-30 (×4): 5 mg via ORAL
  Filled 2014-08-28 (×4): qty 1

## 2014-08-28 MED ORDER — ASPIRIN 325 MG PO TABS
325.0000 mg | ORAL_TABLET | Freq: Every day | ORAL | Status: DC
  Administered 2014-08-28 – 2014-09-02 (×6): 325 mg via ORAL
  Filled 2014-08-28 (×6): qty 1

## 2014-08-28 NOTE — Progress Notes (Signed)
PROGRESS NOTE  [name]:[id number] DOB: 1945-12-03 DOA: 08/26/2014 PCP: Redmond Baseman, MD   Brief history 68 year old female with history of coronary artery disease, diabetes mellitus type 2, COPD, hyperlipidemia, tobacco use presented to the emergency department on 08/26/2014 because of left approximate weakness and dysesthesias. The patient's husband states that the patient has been confused and having hallucinations for 2 weeks prior to admission with associated generalized weakness and worsening dyspnea on exertion for the period of time.  He stated that the patient was very resistant to see a physician or go to the hospital, until she developed left upper extremity weakness. The patient was found to be hypotensive with acute kidney injury. In addition, she was hyponatremic with a sodium of 121 and hypokalemic with potassium of less than 2.2. The patient was admitted to the ICU where the patient continued to be hypotensive. The patient was started on Neo-Synephrine after she received over 4 L of normal saline. The patient subsequently developed hypoxemia and was placed on a nonrebreather.  Critical care medicine due to the patient's acute medical illness and need for pressor support. Cardiology was consulted due to the patient's history of ischemic heart disease and paroxysmal mitral fibrillation. Assessment/Plan: Sepsis -Likely due to pneumonia and urinary source -The patient received a dose of levofloxacin on 08/26/2014 @2340  -Unfortunately, blood cultures were not previously obtained -08/27/2014 Blood cultures 2 sets--negative so far -08/27/2014 Broaden antibiotic coverage with vancomycin and imipenem as the patient has penicillin allergy -Follow up urine culture -appreciate CCM -Procalcitonin 0.57-->0.28 -Patient remains on Levophed Acute respiratory failure -due to pneumonia -Concerned about volume overload now-->start lasix -she had received >4L  fluid -Patient remains nonrebreather -d/c IVF Pneumonia -IV abx as discussed, narrow coverage pending culture data -pulm hygiene -bronchodilators Atrial fibrillation with RVR -Likely due to the patient's sepsis -07/27/2014 Lexiscan was normal with normal EF -07/27/2014 echocardiogram EF 65-70 percent -Appreciate cardiology followup -Plans noted for repeat echocardiogram Elevated Troponin -likely demand ischemia -cardiology following AKI -due to sepsis and hemodynamic changes -improved with IVF -monitor electrolyes Hyponatremia -due to volume depletion and zaroxolyn -pt was also on lasix -all diuretics on hold -stop NS for now to avoid risk of osmotic demyelination syndrome as the patient's sodium has increased from 121-131 Hypokalemia -Repleted Diabetes mellitus type II -NovoLog sliding scale -08/26/2014 hemoglobin A1c 5.5 Left arm weakness -MRI brain when the patient is clinically stable -This has been present for one week prior to admission -Start aspirin Chronic pain with chronic opioid dependence -Decrease opioids, but were not discontinued altogether to avoid withdrawal and pt still with poorly controlled pain -restart MS Contin at lower dose and oxyIR at lower dose--discussed with husband and pt that BP is limiting opioid use -Changed to fentanyl IV Leukocytosis -Patient is presently on stress steroids  Family Communication: Pt at beside Disposition Plan: Home when medically stable     Antibiotics:  Levofloxacin 08/26/2014>>>  Vancomycin 08/27/2014  Imipenem 08/27/2014>>>      Procedures/Studies: Ct Head Wo Contrast  08/26/2014   CLINICAL DATA:  Progressive left upper extremity weakness over the past 1 week.  EXAM: CT HEAD WITHOUT CONTRAST  TECHNIQUE: Contiguous axial images were obtained from the base of the skull through the vertex without intravenous contrast.  COMPARISON:  None.  FINDINGS: Mild age related cortical atrophy. Ventricular  system normal in size and appearance for age. No mass lesion. No midline shift. No acute hemorrhage or hematoma. No extra-axial fluid  collections. No evidence of acute infarction.  No skull fracture or other focal osseous abnormality involving the skull. Visualized paranasal sinuses, bilateral mastoid air cells and bilateral middle ear cavities well-aerated. Mild bilateral carotid siphon atherosclerosis.  IMPRESSION: 1. No acute intracranial abnormality. 2. Mild age related cortical atrophy.   Electronically Signed   By: Hulan Saas M.D.   On: 08/26/2014 17:04   Dg Chest Port 1 View  08/27/2014   CLINICAL DATA:  New left-sided PICC line.  EXAM: PORTABLE CHEST - 1 VIEW  COMPARISON:  08/27/2014  FINDINGS: Left-sided PICC line has been placed, tip overlying the level of the superior vena cava. There is increasing infiltrate in the left lung base. No evidence for pneumothorax. Persistent interstitial edema. Again noted is left posterior ninth rib fracture. No pneumothorax.  IMPRESSION: 1. Interval placement of left-sided PICC line. 2. Developing left lower lobe infiltrate.   Electronically Signed   By: Rosalie Gums M.D.   On: 08/27/2014 10:43   Dg Chest Port 1 View  08/27/2014   CLINICAL DATA:  68 year old female with respiratory failure  EXAM: PORTABLE CHEST - 1 VIEW  COMPARISON:  Prior chest x-ray 06/23/2014  FINDINGS: Patchy airspace infiltrate in the left mid lung and left retrocardiac region concerning for multifocal pneumonia. Stable mild cardiomegaly. Atherosclerotic calcification present in the transverse aorta. Difficult to exclude a small left pleural effusion. No pneumothorax. Background of mild pulmonary vascular congestion bordering on edema. Surgical clips noted in the right axilla. No acute osseous abnormality.  IMPRESSION: 1. Patchy interstitial and airspace opacities in the left mid and lower lung concerning for pneumonia. 2. Background of superimposed pulmonary vascular congestion bordering  on mild interstitial edema. 3. Stable cardiomegaly. 4. Aortic atherosclerosis.   Electronically Signed   By: Malachy Moan M.D.   On: 08/27/2014 08:52         Subjective: Patient complains of chest discomfort or shortness of breath. Breathing is about same as yesterday. Denies any headache, nausea, vomiting, diarrhea, abdominal pain, fevers, chills  Objective: Filed Vitals:   08/28/14 0600 08/28/14 0630 08/28/14 0700 08/28/14 0800  BP: 116/66 109/60 115/79 116/71  Pulse: 70 66 70 79  Temp:      TempSrc:      Resp: 26 17 21 31   Height:      Weight:      SpO2: 100% 96% 99% 93%    Intake/Output Summary (Last 24 hours) at 08/28/14 2725 Last data filed at 08/28/14 0800  Gross per 24 hour  Intake 3492.11 ml  Output   3630 ml  Net -137.89 ml   Weight change:  Exam:   General:  Pt is alert, follows commands appropriately, not in acute distress  HEENT: No icterus, No thrush,  [place]/AT  Cardiovascular: RRR, S1/S2, no rubs, no gallops  Respiratory: Bilateral crackles, left greater than right. No wheezing. Good air movement  Abdomen: Soft/+BS, non tender, non distended, no guarding  Extremities: 1+LE edema, No lymphangitis, No petechiae, No rashes, no synovitis  Data Reviewed: Basic Metabolic Panel:  Recent Labs Lab 08/26/14 1625 08/27/14 0134 08/27/14 0410 08/27/14 1011 08/27/14 1200 08/27/14 1739 08/28/14 0630  NA 121* 127* 131* 130*  --  128* 131*  K <2.2* 2.4* 2.5* 2.6*  --  3.0* 3.5*  CL 66* 73* 77* 82*  --  81* 87*  CO2 41* 41* 33* 39*  --  35* 36*  GLUCOSE 138* 90 84 147*  --  220* 162*  BUN 26* 23 21 15   --  11 8  CREATININE 1.82* 1.30* 1.12* 0.74  --  0.60 0.54  CALCIUM 8.5 8.0* 7.7* 7.7*  --  8.0* 8.5  MG 1.7 1.5  --  1.8  --   --  1.9  PHOS  --   --   --   --  3.4  --  1.9*   Liver Function Tests:  Recent Labs Lab 08/26/14 1625 08/27/14 0134  AST 90* 75*  ALT 28 23  ALKPHOS 368* 297*  BILITOT 0.7 0.5  PROT 7.0 5.8*  ALBUMIN 2.5* 1.9*    No results for input(s): LIPASE, AMYLASE in the last 168 hours. No results for input(s): AMMONIA in the last 168 hours. CBC:  Recent Labs Lab 08/26/14 1625 08/27/14 0134 08/28/14 0530  WBC 14.1* 9.8 12.7*  NEUTROABS 11.5* 7.4 11.3*  HGB 11.1* 9.7* 9.8*  HCT 31.1* 27.5* 28.0*  MCV 101.3* 100.7* 102.9*  PLT 265 212 267   Cardiac Enzymes:  Recent Labs Lab 08/27/14 0445 08/27/14 1011 08/27/14 1611  TROPONINI <0.30 0.53* 1.20*   BNP: Invalid input(s): POCBNP CBG:  Recent Labs Lab 08/27/14 1715 08/27/14 2100 08/28/14 0029 08/28/14 0519 08/28/14 0814  GLUCAP 225* 220* 223* 176* 168*    Recent Results (from the past 240 hour(s))  MRSA PCR Screening     Status: None   Collection Time: 08/26/14  9:40 PM  Result Value Ref Range Status   MRSA by PCR NEGATIVE NEGATIVE Final    Comment:        The GeneXpert MRSA Assay (FDA approved for NASAL specimens only), is one component of a comprehensive MRSA colonization surveillance program. It is not intended to diagnose MRSA infection nor to guide or monitor treatment for MRSA infections.      Scheduled Meds: . antiseptic oral rinse  7 mL Mouth Rinse BID  . furosemide  40 mg Intravenous BID  . hydrocortisone sod succinate (SOLU-CORTEF) inj  50 mg Intravenous Q6H  . imipenem-cilastatin  500 mg Intravenous Q8H  . Influenza vac split quadrivalent PF  0.5 mL Intramuscular Tomorrow-1000  . insulin aspart  0-15 Units Subcutaneous 6 times per day  . ipratropium-albuterol  3 mL Nebulization Q6H  . levofloxacin (LEVAQUIN) IV  500 mg Intravenous Q24H  . nicotine  21 mg Transdermal Q24H  . pneumococcal 23 valent vaccine  0.5 mL Intramuscular Tomorrow-1000  . potassium chloride  20 mEq Oral TID  . simvastatin  10 mg Oral QPM  . sodium chloride  10-40 mL Intracatheter Q12H  . sodium chloride  3 mL Intravenous Q12H  . vancomycin  1,000 mg Intravenous Q12H   Continuous Infusions: . norepinephrine (LEVOPHED) Adult infusion 5  mcg/min (08/28/14 0800)     Lizbett Garciagarcia, DO  [facility] Pager [phone]  If 7PM-7AM, please contact night-coverage [email or web address] Password [email or web address] 08/28/2014, 9:24 AM   LOS: 2 days

## 2014-08-28 NOTE — Progress Notes (Signed)
  Echocardiogram 2D Echocardiogram has been performed.  [name] FRANCES 08/28/2014, 2:10 PM

## 2014-08-28 NOTE — Progress Notes (Signed)
PULMONARY / CRITICAL CARE MEDICINE   Name: [name] MRN: [id number] DOB: 09/30/1945   PCP Redmond Baseman, MD LOS 2 days  ADMISSION DATE:  08/26/2014 CONSULTATION DATE:  08/27/14  REFERRING MD :  Dr Onalee Hua Tat of [facility]  CHIEF COMPLAINT:  Circulatory shock  INITIAL PRESENTATION:  68 yo female with dm2, hypertension, Copd, chronic pain (MS contin 30mg  bid, neurotiin 600 tid, diclofenac, oxycodone prn) not on home o2 apparently c/o weakness in the left arm for about one week prior to admission but worse on day of admit 08/26/2014.  Pt therefore presented to the ED and was noted to have severe hypokalemia. CT scan brain negative. Pt also noted to be hyponatremic and with acute renal failure. Pt notes slight numbness, tingling in the 1st and 2nd finger left hand. Pt notes that her bp medication was adjusted somewhat recently. Pt taken off clonidine and placed on another medication. Pt denies nsaid use. Pt  admitted for hypokalemia, hyponatremia, and acute renal failure on 08/26/2014. Subsequently becamse hypotensive needing Neosynephrine (despite 4L fluids)and also hypoxemic needing NRB with CXR showing LLL consolidation and UA suggesting UTI. She also had transint A Fib. On 08/27/14 due to continued pressors PCCM consulted   SIGNIFICANT EVENTS: 08/26/2014 - admit 08/27/14 - PCCM consult for pressor need   SUBJECTIVE:  Feels a little better  VITAL SIGNS: Temp:  [97.9 F (36.6 C)-98.7 F (37.1 C)] 97.9 F (36.6 C) (12/14 0400) Pulse Rate:  [64-99] 79 (12/14 0800) Resp:  [13-34] 31 (12/14 0800) BP: (81-141)/(44-110) 116/71 mmHg (12/14 0800) SpO2:  [84 %-100 %] 93 % (12/14 0800) FiO2 (%):  [75 %-100 %] 100 % (12/14 0800) HEMODYNAMICS:   VENTILATOR SETTINGS: Vent Mode:  [-]  FiO2 (%):  [75 %-100 %] 100 % INTAKE / OUTPUT:  Intake/Output Summary (Last 24 hours) at 08/28/14 0936 Last data filed at 08/28/14 0800  Gross per 24 hour  Intake 3492.11 ml  Output   3630 ml   Net -137.89 ml    PHYSICAL EXAMINATION: General:  Chronically ill looking deconditioned female. Looks much older than stated age Neuro:  Currently awake and appropriate. C/o chronic pain HEENT:  Supple neck Cardiovascular:  S1S2+. No murmurs Lungs:  On high flow O2. Decreased on left, w/ L>R rhonchi  Abdomen:  Soft, non tender, no organomegaly Musculoskeletal:  Chronic pedal edema + Skin:  Left forearm bruised +  LABS:  PULMONARY  Recent Labs Lab 08/27/14 0844 08/27/14 1500  PHART 7.520* 7.529*  PCO2ART 48.7* 44.3  PO2ART 68.9* 56.7*  HCO3 39.5* 36.7*  TCO2 35.9 32.0  O2SAT 93.8 89.3    CBC  Recent Labs Lab 08/26/14 1625 08/27/14 0134 08/28/14 0530  HGB 11.1* 9.7* 9.8*  HCT 31.1* 27.5* 28.0*  WBC 14.1* 9.8 12.7*  PLT 265 212 267    COAGULATION  Recent Labs Lab 08/26/14 1625  INR 0.94    CARDIAC    Recent Labs Lab 08/27/14 0445 08/27/14 1011 08/27/14 1611  TROPONINI <0.30 0.53* 1.20*    Recent Labs Lab 08/27/14 1611 08/28/14 0530  PROBNP 5589.0* 8165.0*   CHEMISTRY  Recent Labs Lab 08/26/14 1625 08/27/14 0134 08/27/14 0410 08/27/14 1011 08/27/14 1200 08/27/14 1739 08/28/14 0630  NA 121* 127* 131* 130*  --  128* 131*  K <2.2* 2.4* 2.5* 2.6*  --  3.0* 3.5*  CL 66* 73* 77* 82*  --  81* 87*  CO2 41* 41* 33* 39*  --  35* 36*  GLUCOSE 138* 90 84 147*  --  220* 162*  BUN 26* 23 21 15   --  11 8  CREATININE 1.82* 1.30* 1.12* 0.74  --  0.60 0.54  CALCIUM 8.5 8.0* 7.7* 7.7*  --  8.0* 8.5  MG 1.7 1.5  --  1.8  --   --  1.9  PHOS  --   --   --   --  3.4  --  1.9*   Estimated Creatinine Clearance: 66.2 mL/min (by C-G formula based on Cr of 0.54).   LIVER  Recent Labs Lab 08/26/14 1625 08/27/14 0134  AST 90* 75*  ALT 28 23  ALKPHOS 368* 297*  BILITOT 0.7 0.5  PROT 7.0 5.8*  ALBUMIN 2.5* 1.9*  INR 0.94  --     INFECTIOUS  Recent Labs Lab 08/27/14 0445 08/27/14 1315 08/28/14 0530  LATICACIDVEN 2.7* 1.5 1.8  PROCALCITON  0.57  --  0.28   ENDOCRINE CBG (last 3)   Recent Labs  08/28/14 0029 08/28/14 0519 08/28/14 0814  GLUCAP 223* 176* 168*    IMAGING x48h Ct Head Wo Contrast  08/26/2014   CLINICAL DATA:  Progressive left upper extremity weakness over the past 1 week.  EXAM: CT HEAD WITHOUT CONTRAST  TECHNIQUE: Contiguous axial images were obtained from the base of the skull through the vertex without intravenous contrast.  COMPARISON:  None.  FINDINGS: Mild age related cortical atrophy. Ventricular system normal in size and appearance for age. No mass lesion. No midline shift. No acute hemorrhage or hematoma. No extra-axial fluid collections. No evidence of acute infarction.  No skull fracture or other focal osseous abnormality involving the skull. Visualized paranasal sinuses, bilateral mastoid air cells and bilateral middle ear cavities well-aerated. Mild bilateral carotid siphon atherosclerosis.  IMPRESSION: 1. No acute intracranial abnormality. 2. Mild age related cortical atrophy.   Electronically Signed   By: Hulan Saas M.D.   On: 08/26/2014 17:04   Dg Chest Port 1 View  08/27/2014   CLINICAL DATA:  New left-sided PICC line.  EXAM: PORTABLE CHEST - 1 VIEW  COMPARISON:  08/27/2014  FINDINGS: Left-sided PICC line has been placed, tip overlying the level of the superior vena cava. There is increasing infiltrate in the left lung base. No evidence for pneumothorax. Persistent interstitial edema. Again noted is left posterior ninth rib fracture. No pneumothorax.  IMPRESSION: 1. Interval placement of left-sided PICC line. 2. Developing left lower lobe infiltrate.   Electronically Signed   By: Rosalie Gums M.D.   On: 08/27/2014 10:43   Dg Chest Port 1 View  08/27/2014   CLINICAL DATA:  68 year old female with respiratory failure  EXAM: PORTABLE CHEST - 1 VIEW  COMPARISON:  Prior chest x-ray 06/23/2014  FINDINGS: Patchy airspace infiltrate in the left mid lung and left retrocardiac region concerning for  multifocal pneumonia. Stable mild cardiomegaly. Atherosclerotic calcification present in the transverse aorta. Difficult to exclude a small left pleural effusion. No pneumothorax. Background of mild pulmonary vascular congestion bordering on edema. Surgical clips noted in the right axilla. No acute osseous abnormality.  IMPRESSION: 1. Patchy interstitial and airspace opacities in the left mid and lower lung concerning for pneumonia. 2. Background of superimposed pulmonary vascular congestion bordering on mild interstitial edema. 3. Stable cardiomegaly. 4. Aortic atherosclerosis.   Electronically Signed   By: Malachy Moan M.D.   On: 08/27/2014 08:52     ASSESSMENT / PLAN:  PULMONARY OETT  A:  Acute Hypoxemic Respioratory Failure in setting of LLL consolidation, sepsis picture and also Chronic opioid intake  P:   o2 for pulse ox > 88% Add flutter valve/IS and mobilize Bipap PRN Careful w/ narcs See ID section  Repeat CXR 12/15  CARDIOVASCULAR CVL LUE PICC + A:  Circulatory shock, Likely septic-->off pressors now as of 12/14 Possible steress NSTEMI P:  Abx per ID section Wean levophed for SBP goal > 90  Hydrocort stress dose steroids; d/c as cort > 20  Cards following  Get CVP from PICC Careful w/ diuretics-->will get CVP if low will d/c current lasix order.   RENAL A:   AKI at admit 08/26/2014 in settin of ARB, NSAID, gabapentin, MS contin (latter 2 contribue to worsening encephalopathy) - resolved 08/27/14 with fluids Hypokalemia Hypophosphatemia  P:   No NSAID No ARB Hold off neurontin and MS Contin Replaced K Will replace PO4  GASTROINTESTINAL A:  Need for NPO due to mental status P:   NPO  HEMATOLOGIC A:   Anemia of chronic and critical illlness   P:  - PRBC for hgb </= 6.9gm%   - exceptions are  if ACS susepcted/confirmed then transfuse for hgb </= 8.0gm%,  or   active bleeding with hemodynamic instability, then transfuse regardless of hemoglobin value  At at all times try to transfuse 1 unit prbc as possible with exception of active hemorrhage    INFECTIOUS A:  septic shock due to PNA and +/- UTI P:    primaxin 12/13>>> levaquin 12/12>>> vanc 12/13>>> BCX2 12/13>>> UC 12/2>>>  ENDOCRINE A:  *Dm-2  P:   ICU hyperglycemia protocl  NEUROLOGIC A:   Acute Encephalopathy - currently RASS -2, Compounded by renal failure, circulatory shock, MS contin/gabapentin in setting of renal failure  P:   Hold off ambien, MS contin, neurontin Fentanyl prn only  RASS goal: 0 to -2 IF needed benzo IV prn Avoid benzo/opioid withdrawal MRI brain per [facility]   FAMILY  - Updates: none at bedside 08/27/14  - Inter-disciplinary family meet or Palliative Care meeting due by:  09/02/14   CODE    Code Status Orders        Start     Ordered   08/26/14 2146  Full code   Continuous     08/26/14 2145       NP SUMMARY STATEMENT Feels better. She is off pressors. Still remarkably decreased on the left and requiring high FIO2. Will push pulmonary hygiene, cont current abx and repeat CXR in am. Might need to consider CT of chest at some point if high O2 requirements persist. Will check CVP now. If CVP low, risk of going back on pressors would be increased and chance of sig resp improvement low.   30 minutes critical care time   Simonne Martinet ACNP-BC [facility] Pulmonary/Critical Care Pager # [phone] OR # [phone] if no answer    08/28/2014 9:36 AM

## 2014-08-28 NOTE — Progress Notes (Signed)
    Subjective:  Continues to complain of constant chest discomfort and shortness of breath.  Objective:  Vital Signs in the last 24 hours: Temp:  [97.9 F (36.6 C)-98.7 F (37.1 C)] 97.9 F (36.6 C) (12/14 0400) Pulse Rate:  [64-123] 66 (12/14 0630) Resp:  [13-34] 17 (12/14 0630) BP: (81-141)/(44-110) 109/60 mmHg (12/14 0630) SpO2:  [84 %-100 %] 96 % (12/14 0630) FiO2 (%):  [75 %-100 %] 75 % (12/13 1607)  Intake/Output from previous day: 12/13 0701 - 12/14 0700 In: 3890.7 [P.O.:120; I.V.:1870.7; IV Piggyback:1900] Out: 4630 [Urine:4630]  Physical Exam: Pt is alert and oriented, NAD, on oxygen nonrebreather HEENT: normal Neck: JVP - normal Lungs: Coarse breath sounds bilaterally CV: RRR without murmur or gallop Abd: soft, NT, Positive BS Ext: Diffuse edema Skin: warm/dry no rash   Lab Results:  Recent Labs  08/27/14 0134 08/28/14 0530  WBC 9.8 12.7*  HGB 9.7* 9.8*  PLT 212 267    Recent Labs  08/27/14 1739 08/28/14 0630  NA 128* 131*  K 3.0* 3.5*  CL 81* 87*  CO2 35* 36*  GLUCOSE 220* 162*  BUN 11 8  CREATININE 0.60 0.54    Recent Labs  08/27/14 1011 08/27/14 1611  TROPONINI 0.53* 1.20*    Cardiac Studies: 2-D echocardiogram: Study Conclusions  - Left ventricle: Systolic function was vigorous. The estimated ejection fraction was in the range of 65% to 70%. Wall motion was normal; there were no regional wall motion abnormalities. The deceleration time of the early transmitral flow velocity was normal. The pulmonary vein flow pattern was normal. Left ventricular diastolic function parameters were normal. - Aortic valve: Structurally normal valve. Trileaflet. Transvalvular velocity was within the normal range. There was no stenosis. There was no significant regurgitation. - Aortic root: The aortic root was normal in size. - Ascending aorta: The ascending aorta was normal in size. - Mitral valve: Calcified annulus. Mildly  thickened leaflets . There was mild regurgitation. Valve area by continuity equation (using LVOT flow): 2.75 cm^2. - Left atrium: The atrium was mildly dilated. - Tricuspid valve: Structurally normal valve. There was mild regurgitation. - Pulmonic valve: There was trivial regurgitation. - Inferior vena cava: The vessel was normal in size. The respirophasic diameter changes were in the normal range (= 50%), consistent with normal central venous pressure.   Tele: Normal sinus rhythm without significant arrhythmia  Assessment/Plan:  1. Acute hypoxemic respiratory failure, likely secondary to pneumonia 2. Shock, septic 3. Coronary artery disease status post multiple PCI procedures with elevated troponin in the setting of acute medical illness, likely demand ischemia 4. Paroxysmal atrial fibrillation, now maintaining sinus rhythm. 5. Acute diastolic heart failure  Lab data and radiographic data reviewed. Yesterday's chest x-ray demonstrated a developing pulmonary and full trait concerning for pneumonia. Will order a repeat chest x-ray today in the setting of continued hypoxemia. Will also review her echocardiogram when done. An echo last month was essentially within normal limits, but now with evidence of demand ischemia and elevated troponin this should be rechecked. I think she should be started back on diuretic therapy with her marginal respiratory situation and elevated proBNP. Electrolytes will have to be watched carefully. Will continue to follow.  Sherren Mocha, M.D. 08/28/2014, 8:14 AM

## 2014-08-28 NOTE — Progress Notes (Signed)
Nutrition Brief Note  Malnutrition Screening Tool result is inaccurate.  RD corrected screen. Please consult if nutrition needs are identified.  Dalesha Stanback F Danyela Posas MS RD LDN Clinical Dietitian Pager:[phone]  

## 2014-08-29 ENCOUNTER — Inpatient Hospital Stay (HOSPITAL_COMMUNITY): Payer: BC Managed Care – PPO

## 2014-08-29 DIAGNOSIS — J189 Pneumonia, unspecified organism: Secondary | ICD-10-CM

## 2014-08-29 DIAGNOSIS — E440 Moderate protein-calorie malnutrition: Secondary | ICD-10-CM | POA: Insufficient documentation

## 2014-08-29 DIAGNOSIS — A4151 Sepsis due to Escherichia coli [E. coli]: Secondary | ICD-10-CM

## 2014-08-29 DIAGNOSIS — N390 Urinary tract infection, site not specified: Secondary | ICD-10-CM

## 2014-08-29 LAB — BASIC METABOLIC PANEL
Anion gap: 11 (ref 5–15)
BUN: 5 mg/dL — ABNORMAL LOW (ref 6–23)
CO2: 36 mEq/L — ABNORMAL HIGH (ref 19–32)
Calcium: 8.8 mg/dL (ref 8.4–10.5)
Chloride: 86 mEq/L — ABNORMAL LOW (ref 96–112)
Creatinine, Ser: 0.44 mg/dL — ABNORMAL LOW (ref 0.50–1.10)
Glucose, Bld: 84 mg/dL (ref 70–99)
POTASSIUM: 3 meq/L — AB (ref 3.7–5.3)
SODIUM: 133 meq/L — AB (ref 137–147)

## 2014-08-29 LAB — GLUCOSE, CAPILLARY
GLUCOSE-CAPILLARY: 149 mg/dL — AB (ref 70–99)
GLUCOSE-CAPILLARY: 107 mg/dL — AB (ref 70–99)
GLUCOSE-CAPILLARY: 122 mg/dL — AB (ref 70–99)
GLUCOSE-CAPILLARY: 91 mg/dL (ref 70–99)
Glucose-Capillary: 178 mg/dL — ABNORMAL HIGH (ref 70–99)
Glucose-Capillary: 86 mg/dL (ref 70–99)
Glucose-Capillary: 93 mg/dL (ref 70–99)

## 2014-08-29 LAB — CBC WITH DIFFERENTIAL/PLATELET
Basophils Absolute: 0 10*3/uL (ref 0.0–0.1)
Basophils Relative: 0 % (ref 0–1)
Eosinophils Absolute: 0 10*3/uL (ref 0.0–0.7)
Eosinophils Relative: 0 % (ref 0–5)
HEMATOCRIT: 28 % — AB (ref 36.0–46.0)
Hemoglobin: 9.5 g/dL — ABNORMAL LOW (ref 12.0–15.0)
Lymphocytes Relative: 10 % — ABNORMAL LOW (ref 12–46)
Lymphs Abs: 1.2 10*3/uL (ref 0.7–4.0)
MCH: 35.2 pg — ABNORMAL HIGH (ref 26.0–34.0)
MCHC: 33.9 g/dL (ref 30.0–36.0)
MCV: 103.7 fL — ABNORMAL HIGH (ref 78.0–100.0)
MONOS PCT: 8 % (ref 3–12)
Monocytes Absolute: 1 10*3/uL (ref 0.1–1.0)
NEUTROS PCT: 82 % — AB (ref 43–77)
Neutro Abs: 9.7 10*3/uL — ABNORMAL HIGH (ref 1.7–7.7)
Platelets: 257 10*3/uL (ref 150–400)
RBC: 2.7 MIL/uL — ABNORMAL LOW (ref 3.87–5.11)
RDW: 13.5 % (ref 11.5–15.5)
WBC: 12 10*3/uL — ABNORMAL HIGH (ref 4.0–10.5)

## 2014-08-29 LAB — URINE CULTURE: Colony Count: >=100000

## 2014-08-29 LAB — LEGIONELLA ANTIGEN, URINE

## 2014-08-29 LAB — PROCALCITONIN: Procalcitonin: <0.1 ng/mL

## 2014-08-29 LAB — PHOSPHORUS: PHOSPHORUS: 2.4 mg/dL (ref 2.3–4.6)

## 2014-08-29 LAB — VANCOMYCIN, TROUGH: VANCOMYCIN TR: 14.9 ug/mL (ref 10.0–20.0)

## 2014-08-29 LAB — MAGNESIUM: Magnesium: 1.4 mg/dL — ABNORMAL LOW (ref 1.5–2.5)

## 2014-08-29 MED ORDER — LORAZEPAM 2 MG/ML IJ SOLN: 1.0000 mg | Freq: Four times a day (QID) | INTRAMUSCULAR | Status: DC | PRN

## 2014-08-29 MED ORDER — LORAZEPAM 1 MG PO TABS
1.0000 mg | ORAL_TABLET | Freq: Four times a day (QID) | ORAL | Status: DC | PRN
  Administered 2014-08-29: 1 mg via ORAL
  Filled 2014-08-29: qty 1

## 2014-08-29 MED ORDER — VANCOMYCIN HCL 10 G IV SOLR
1250.0000 mg | Freq: Two times a day (BID) | INTRAVENOUS | Status: DC
  Administered 2014-08-29: 1250 mg via INTRAVENOUS
  Filled 2014-08-29 (×2): qty 1250

## 2014-08-29 MED ORDER — ADULT MULTIVITAMIN W/MINERALS CH
1.0000 | ORAL_TABLET | Freq: Every day | ORAL | Status: DC
  Administered 2014-08-29 – 2014-09-02 (×5): 1 via ORAL
  Filled 2014-08-29 (×5): qty 1

## 2014-08-29 MED ORDER — MAGNESIUM SULFATE 2 GM/50ML IV SOLN
2.0000 g | Freq: Once | INTRAVENOUS | Status: AC
  Administered 2014-08-29: 2 g via INTRAVENOUS
  Filled 2014-08-29: qty 50

## 2014-08-29 MED ORDER — THIAMINE HCL 100 MG/ML IJ SOLN
100.0000 mg | Freq: Every day | INTRAMUSCULAR | Status: DC
  Administered 2014-08-30: 100 mg via INTRAVENOUS
  Filled 2014-08-29: qty 1
  Filled 2014-08-29: qty 2

## 2014-08-29 MED ORDER — MORPHINE SULFATE ER 30 MG PO TBCR
30.0000 mg | EXTENDED_RELEASE_TABLET | Freq: Three times a day (TID) | ORAL | Status: DC
  Administered 2014-08-29 – 2014-09-02 (×12): 30 mg via ORAL
  Filled 2014-08-29 (×12): qty 1

## 2014-08-29 MED ORDER — LEVOFLOXACIN IN D5W 750 MG/150ML IV SOLN
750.0000 mg | INTRAVENOUS | Status: DC
  Administered 2014-08-29 – 2014-08-31 (×3): 750 mg via INTRAVENOUS
  Filled 2014-08-29 (×4): qty 150

## 2014-08-29 MED ORDER — VITAMIN B-1 100 MG PO TABS
100.0000 mg | ORAL_TABLET | Freq: Every day | ORAL | Status: DC
  Administered 2014-08-29 – 2014-09-02 (×4): 100 mg via ORAL
  Filled 2014-08-29 (×4): qty 1

## 2014-08-29 MED ORDER — FOLIC ACID 1 MG PO TABS
1.0000 mg | ORAL_TABLET | Freq: Every day | ORAL | Status: DC
  Administered 2014-08-29 – 2014-09-02 (×5): 1 mg via ORAL
  Filled 2014-08-29 (×5): qty 1

## 2014-08-29 NOTE — Progress Notes (Signed)
PULMONARY / CRITICAL CARE MEDICINE   Name: [name] MRN: [id number] DOB: 04-Jun-1946   PCP Redmond Baseman, MD LOS 3 days  ADMISSION DATE:  08/26/2014 CONSULTATION DATE:  08/27/14  REFERRING MD :  Dr Onalee Hua Tat of [facility]  CHIEF COMPLAINT:  Circulatory shock  INITIAL PRESENTATION:  68 yo female with dm2, hypertension, Copd, chronic pain (MS contin 30mg  bid, neurotiin 600 tid, diclofenac, oxycodone prn) not on home o2 apparently c/o weakness in the left arm for about one week prior to admission but worse on day of admit 08/26/2014.  Pt therefore presented to the ED and was noted to have severe hypokalemia. CT scan brain negative. Pt also noted to be hyponatremic and with acute renal failure. Pt notes slight numbness, tingling in the 1st and 2nd finger left hand. Pt notes that her bp medication was adjusted somewhat recently. Pt taken off clonidine and placed on another medication. Pt denies nsaid use. Pt  admitted for hypokalemia, hyponatremia, and acute renal failure on 08/26/2014. Subsequently becamse hypotensive needing Neosynephrine (despite 4L fluids)and also hypoxemic needing NRB with CXR showing LLL consolidation and UA suggesting UTI. She also had transint A Fib. On 08/27/14 due to continued pressors PCCM consulted   SIGNIFICANT EVENTS: 08/26/2014 - admit 08/27/14 - PCCM consult for pressor need   SUBJECTIVE:  Feels a little better  VITAL SIGNS: Temp:  [97.4 F (36.3 C)-99.9 F (37.7 C)] 98.1 F (36.7 C) (12/15 0800) Pulse Rate:  [70-94] 92 (12/15 1000) Resp:  [17-28] 23 (12/15 1000) BP: (79-123)/(52-106) 115/93 mmHg (12/15 0900) SpO2:  [88 %-100 %] 88 % (12/15 1001) FiO2 (%):  [100 %] 100 % (12/15 1001) Weight:  [72.576 kg (160 lb)] 72.576 kg (160 lb) (12/14 1140) HEMODYNAMICS: CVP:  [3 mmHg-20 mmHg] 3 mmHg VENTILATOR SETTINGS: Vent Mode:  [-]  FiO2 (%):  [100 %] 100 % INTAKE / OUTPUT:  Intake/Output Summary (Last 24 hours) at 08/29/14 1133 Last data  filed at 08/29/14 1044  Gross per 24 hour  Intake   1160 ml  Output   1425 ml  Net   -265 ml    PHYSICAL EXAMINATION: General:  Chronically ill looking deconditioned female. Looks much older than stated age Neuro:  Currently awake and appropriate. C/o chronic pain HEENT:  Supple neck Cardiovascular:  S1S2+. No murmurs Lungs:  On high flow O2. Decreased on left, w/ L>R rhonchi  Abdomen:  Soft, non tender, no organomegaly Musculoskeletal:  Chronic pedal edema + Skin:  Left forearm bruised +  LABS:  PULMONARY  Recent Labs Lab 08/27/14 0844 08/27/14 1500  PHART 7.520* 7.529*  PCO2ART 48.7* 44.3  PO2ART 68.9* 56.7*  HCO3 39.5* 36.7*  TCO2 35.9 32.0  O2SAT 93.8 89.3    CBC  Recent Labs Lab 08/27/14 0134 08/28/14 0530 08/29/14 0510  HGB 9.7* 9.8* 9.5*  HCT 27.5* 28.0* 28.0*  WBC 9.8 12.7* 12.0*  PLT 212 267 257    COAGULATION  Recent Labs Lab 08/26/14 1625  INR 0.94    CARDIAC    Recent Labs Lab 08/27/14 0445 08/27/14 1011 08/27/14 1611  TROPONINI <0.30 0.53* 1.20*    Recent Labs Lab 08/27/14 1611 08/28/14 0530  PROBNP 5589.0* 8165.0*   CHEMISTRY  Recent Labs Lab 08/26/14 1625 08/27/14 0134 08/27/14 0410 08/27/14 1011 08/27/14 1200 08/27/14 1739 08/28/14 0630 08/29/14 0510  NA 121* 127* 131* 130*  --  128* 131* 133*  K <2.2* 2.4* 2.5* 2.6*  --  3.0* 3.5* 3.0*  CL 66* 73* 77*  82*  --  81* 87* 86*  CO2 41* 41* 33* 39*  --  35* 36* 36*  GLUCOSE 138* 90 84 147*  --  220* 162* 84  BUN 26* 23 21 15   --  11 8 5*  CREATININE 1.82* 1.30* 1.12* 0.74  --  0.60 0.54 0.44*  CALCIUM 8.5 8.0* 7.7* 7.7*  --  8.0* 8.5 8.8  MG 1.7 1.5  --  1.8  --   --  1.9 1.4*  PHOS  --   --   --   --  3.4  --  1.9* 2.4   Estimated Creatinine Clearance: 66.2 mL/min (by C-G formula based on Cr of 0.44).   LIVER  Recent Labs Lab 08/26/14 1625 08/27/14 0134  AST 90* 75*  ALT 28 23  ALKPHOS 368* 297*  BILITOT 0.7 0.5  PROT 7.0 5.8*  ALBUMIN 2.5* 1.9*   INR 0.94  --     INFECTIOUS  Recent Labs Lab 08/27/14 0445 08/27/14 1315 08/28/14 0530 08/29/14 0510  LATICACIDVEN 2.7* 1.5 1.8  --   PROCALCITON 0.57  --  0.28 <0.10   ENDOCRINE CBG (last 3)   Recent Labs  08/28/14 2344 08/29/14 0523 08/29/14 0821  GLUCAP 178* 86 93    IMAGING x48h Mr Brain Wo Contrast  08/28/2014   CLINICAL DATA:  Breast cancer. Increased lethargy. Difficulty holding still. Weakness of the left arm.  EXAM: MRI HEAD WITHOUT CONTRAST  TECHNIQUE: Multiplanar, multiecho pulse sequences of the brain and surrounding structures were obtained without intravenous contrast.  COMPARISON:  CT head without contrast to 08/26/2014.  FINDINGS: The study is mildly degraded by patient motion. No acute cortical infarct, hemorrhage, or mass lesion is present. Mild generalized atrophy is within normal limits for age. No significant white matter disease is evident. Flow is present in the major intracranial arteries.  No significant extraaxial fluid collection is present. The patient is status post bilateral lens replacements. The paranasal sinuses and mastoid air cells are clear.  IMPRESSION: 1. Normal MRI appearance of the brain for age.   Electronically Signed   By: Gennette Pac M.D.   On: 08/28/2014 12:39   Dg Chest Port 1 View  08/29/2014   CLINICAL DATA:  Pneumonia  EXAM: PORTABLE CHEST - 1 VIEW  COMPARISON:  Portable chest x-ray of August 27, 2014 at 10:11 a.m.  FINDINGS: There has been progressive opacification of the inferior 1/2 of the left hemi thorax with opacification of the left heart border and the left hemidiaphragm. Patchy interstitial densities in the mid and lower right lung are more conspicuous today. The pulmonary vascularity is not engorged. The left sided PICC line tip projects over the midportion of the SVC. There is no pneumothorax. Pleural fluid is likely present on the left. There is an un healed fracture of the posterior aspect of the left eighth rib.  There are surgical clips in the right axillary region.  IMPRESSION: Worsening of left lower lobe atelectasis or pneumonia over the past 2 days. Patchy increased density has developed in the right lung as well.   Electronically Signed   By: David  [place]   On: 08/29/2014 07:47     ASSESSMENT / PLAN: RESOLVED ISSUES Resolved septic shock Resolved AKI  ACTIVE ISSUES Acute Hypoxemic Respioratory Failure in setting of LLL PNA (NOS) E coli UTI  Possible stress NSTEMI Hypokalemia Anemia of chronic and critical illlness Acute Encephalopathy - currently RASS -2, Compounded by renal failure, circulatory shock, MS contin/gabapentin in setting  of renal failure. Now concerned about w/d  Dm-2  Discussion  Still requiring high FIO2. Has persistent dense LLL changes c/w PNA. On bedside US there is small effusion, but don't think this is large enough to give therapeutic benefit from a breathing stand-point for thora.  Plan:   o2 for pulse ox > 88% cont flutter valve/IS and mobilize Bipap PRN Careful w/ narcs Cont current abx;   primaxin 12/13>>>  levaquin 12/12>>>  vanc 12/13>>> Consider CT imaging of chest. Will d/w Dr Marchelle Gearing Other issues re: encephalopathy, electrolyte derangements, and NSTEMI per primary and cardiology services   Simonne Martinet ACNP-BC [facility] Pulmonary/Critical Care Pager # [phone] OR # [phone] if no answer    08/29/2014 11:33 AM

## 2014-08-29 NOTE — Progress Notes (Signed)
    Subjective:  Feeling a little better today. Still short of breath but improving. Chest pain with coughing.  Objective:  Vital Signs in the last 24 hours: Temp:  [96.9 F (36.1 C)-99.9 F (37.7 C)] 97.4 F (36.3 C) (12/15 0000) Pulse Rate:  [66-91] 91 (12/15 0100) Resp:  [17-31] 26 (12/15 0100) BP: (79-123)/(52-106) 122/106 mmHg (12/15 0100) SpO2:  [93 %-100 %] 99 % (12/15 0137) FiO2 (%):  [100 %] 100 % (12/15 0137) Weight:  [160 lb (72.576 kg)] 160 lb (72.576 kg) (12/14 1140)  Intake/Output from previous day: 12/14 0701 - 12/15 0700 In: 1224 [I.V.:424; IV Piggyback:800] Out: 975 [Urine:975]  Physical Exam: Pt is alert and oriented, in mild respiratory distress on a nonrebreather HEENT: normal Neck: JVP - elevated Lungs: Markedly diminished on the left, good air movement on the right CV: RRR without murmur or gallop Abd: soft, NT, Positive BS, no hepatomegaly Ext: 1+ pretibial edema Skin: warm/dry no rash   Lab Results:  Recent Labs  08/28/14 0530 08/29/14 0510  WBC 12.7* 12.0*  HGB 9.8* 9.5*  PLT 267 257    Recent Labs  08/28/14 0630 08/29/14 0510  NA 131* 133*  K 3.5* 3.0*  CL 87* 86*  CO2 36* 36*  GLUCOSE 162* 84  BUN 8 5*  CREATININE 0.54 0.44*    Recent Labs  08/27/14 1011 08/27/14 1611  TROPONINI 0.53* 1.20*    Tele: Personally reviewed, normal sinus rhythm  2-D echocardiogram: Study Conclusions - Left ventricle: The cavity size was normal. Wall thickness was increased in a pattern of mild LVH. Systolic function was normal. The estimated ejection fraction was in the range of 60% to 65%. - Mitral valve: Poorly visualized in apical views. MAC wtih restricted posterior leaflet motion and likely moderate MR. - Left atrium: The atrium was mildly dilated. - Pulmonary arteries: PA peak pressure: 39 mm Hg (S). - Pericardium, extracardiac: Likely large epicardial fat pad and small apical effusion Consider CT to further assess  pericardial space. - Impressions: Overall image quality is poor with non diagnostic apical views.  Impressions:  - Overall image quality is poor with non diagnostic apical views.  Assessment/Plan:  1. Acute hypoxemic respiratory failure, likely secondary to pneumonia 2. Shock, septic 3. Coronary artery disease status post multiple PCI procedures with elevated troponin in the setting of acute medical illness, likely demand ischemia 4. Paroxysmal atrial fibrillation, now maintaining sinus rhythm. 5. Acute diastolic heart failure 6. Hypokalemia 7. Hypomagnesemia  The patient shows modest improvement today. Still with markedly diminished breath sounds on the left. May be best to hold diuretics considering her electrolyte abnormalities and change diuretics to when necessary use to aim for net even fluid balance. Otherwise management per medicine and CCM teams. Replete electrolytes per protocol.  Sherren Mocha, M.D. 08/29/2014, 6:27 AM

## 2014-08-29 NOTE — Progress Notes (Addendum)
ANTIBIOTIC CONSULT NOTE - FOLLOW-UP  Pharmacy Consult for Levofloxacin, Vancomycin Indication: UTI, Sepsis, Pneumonia  Allergies  Allergen Reactions  . Penicillins   . Zithromax [Azithromycin Dihydrate]     ORAL ULCERS     Patient Measurements: Height: 5\' 3"  (160 cm) Weight: 160 lb 7.9 oz (72.8 kg) IBW/kg (Calculated) : 52.4   Vital Signs: Temp: 98.1 F (36.7 C) (12/15 0800) Temp Source: Axillary (12/15 0800) BP: 115/93 mmHg (12/15 0900) Pulse Rate: 92 (12/15 1000) Intake/Output from previous day: 12/14 0701 - 12/15 0700 In: 1244 [I.V.:444; IV Piggyback:800] Out: 975 [Urine:975] Intake/Output from this shift: Total I/O In: 360 [I.V.:60; IV Piggyback:300] Out: 450 [Urine:450]  Labs:  Recent Labs  08/26/14 2334 08/27/14 0134  08/27/14 1739 08/28/14 0530 08/28/14 0630 08/29/14 0510  WBC  --  9.8  --   --  12.7*  --  12.0*  HGB  --  9.7*  --   --  9.8*  --  9.5*  PLT  --  212  --   --  267  --  257  LABCREA 29.4  --   --   --   --   --   --   CREATININE  --  1.30*  < > 0.60  --  0.54 0.44*  < > = values in this interval not displayed. Estimated Creatinine Clearance: 66.2 mL/min (by C-G formula based on Cr of 0.44).  Recent Labs  08/29/14 0930  [place] 14.9     Microbiology: Recent Results (from the past 720 hour(s))  MRSA PCR Screening     Status: None   Collection Time: 08/26/14  9:40 PM  Result Value Ref Range Status   MRSA by PCR NEGATIVE NEGATIVE Final    Comment:        The GeneXpert MRSA Assay (FDA approved for NASAL specimens only), is one component of a comprehensive MRSA colonization surveillance program. It is not intended to diagnose MRSA infection nor to guide or monitor treatment for MRSA infections.   Culture, Urine     Status: None (Preliminary result)   Collection Time: 08/26/14 11:34 PM  Result Value Ref Range Status   Specimen Description URINE, RANDOM  Final   Special Requests NONE  Final   Culture  Setup Time   Final     08/27/2014 11:19 Performed at [place]   Final    >=100,000 COLONIES/ML Performed at [facility]    Culture   Final    ESCHERICHIA COLI Performed at [facility]    Report Status PENDING  Incomplete  Culture, blood (routine x 2)     Status: None (Preliminary result)   Collection Time: 08/27/14  8:30 AM  Result Value Ref Range Status   Specimen Description BLOOD LEFT HAND  Final   Special Requests BOTTLES DRAWN AEROBIC ONLY 1 CC  Final   Culture  Setup Time   Final    08/27/2014 19:25 Performed at [facility]    Culture   Final           BLOOD CULTURE RECEIVED NO GROWTH TO DATE CULTURE WILL BE HELD FOR 5 DAYS BEFORE ISSUING A FINAL NEGATIVE REPORT Note: Culture results may be compromised due to an inadequate volume of blood received in culture bottles. Performed at [facility]    Report Status PENDING  Incomplete  Culture, blood (routine x 2)     Status: None (Preliminary result)   Collection Time:  08/27/14  8:37 AM  Result Value Ref Range Status   Specimen Description BLOOD LEFT HAND  Final   Special Requests BOTTLES DRAWN AEROBIC ONLY 4CC  Final   Culture  Setup Time   Final    08/27/2014 19:25 Performed at [facility]    Culture   Final           BLOOD CULTURE RECEIVED NO GROWTH TO DATE CULTURE WILL BE HELD FOR 5 DAYS BEFORE ISSUING A FINAL NEGATIVE REPORT Performed at [facility]    Report Status PENDING  Incomplete    Medical History: Past Medical History  Diagnosis Date  . Myocardial infarction 2000  . Diabetes mellitus type 2, insulin dependent   . COPD (chronic obstructive pulmonary disease)   . Hyperlipidemia   . Breast cancer   . OA (osteoarthritis) of knee   . Hypertension   . Coronary artery disease   . Asthma   . Elevated LFTs   . ETOH abuse   . Tobacco abuse   . Osteoarthritis     Medications:  Assessment: 68yo F admitted w/ L arm weakness, numbness and  tingling in fingers. Found to have severe hypokalemia, hyponatremia and ARF. Started on Levaquin for possible UTI per pharmacy protocol. Hypotension requiring pressors. Antibiotics broadened for suspected sepsis and pneumonia, with Vancomycin per pharmacy and Primaxin per MD.  12/12 >> Levofloxacin >> 12/13 >> Primaxin>> 12/13 >> Vancomycin >>  Tmax: 99.9 WBCs: elevated, 12K Renal: SCr improved to 0.44, CrCl 66 mL/min Vancomycin trough level 14.9 mcg/mL  12/12 urine: >=100,000 colonies/mL E.coli, pansensitive 12/12 MRSA PCR: negative 12/13 blood x 2: NGTD  Goal of Therapy:  Vancomycin trough level 15-20 mcg/mL Appropriate antibiotic dosing for renal function Eradication of infection  Plan:   Vancomycin trough level just slightly below goal, so will increase Vancomycin dose slightly to 1250 mg IV q12h.  Plan for Vancomycin trough level at new steady state, pending duration of therapy.  Change Levaquin to 750mg  IV q24h for PNA dosing, improved renal function.  Continue Primaxin dosing per MD.  Follow renal function, cultures, clinical course.   Lindell Spar, PharmD, BCPS Pager: [phone] 08/29/2014 12:58 PM

## 2014-08-29 NOTE — Progress Notes (Signed)
INITIAL NUTRITION ASSESSMENT  DOCUMENTATION CODES Per approved criteria  -Non-severe (moderate) malnutrition in the context of chronic illness  Pt meets criteria for moderate MALNUTRITION in the context of chronic illlness as evidenced by PO intake < 75% for one month, ~5% body weight loss in one month.   INTERVENTION: -Recommend Ensure Complete once daily -Recommend multivitamin -Encourage intake as tolerated -RD to continue to monitor  NUTRITION DIAGNOSIS: Increased nutrient needs (protein) related to inadequate oral intake as evidenced by decub ulcers.   Goal: Pt to meet >/= 90% of their estimated nutrition needs    Monitor:  Total protein/energy intake, labs, weight, skin integerity  Reason for Assessment: Low Braden  68 y.o. female  Admitting Dx: <principal problem not specified>  ASSESSMENT: 68 yo female with dm2, hypertension, Copd, not on home o2 apparently c/o weakness in the left arm for about the past week, But was worse today. Pt therefore presented to the ED and was noted to have severe hypokalemia  -Pt reported decreased appetite for past several weeks. Diet recall indicated pt consuming one meal daily  + one Ensure supplement. Endorsed 10 lb unintentional wt loss in past one month (5.8% body weight loss, significant for time frame) -Has hx of ETOH abuse, per MD, pt consuming 4 cups of vodka daily. Recommend multivitamin -Pt NPO upon admit d/t respiratory distress, advanced to Heart healthy on 12/15. Currently tolerating non-rebreather mask. Declined breakfast or lunch when RD offered to assist in ordering meals -Provided pt with Ensure supplement with RN approval -No muscle wasting or subcutaneous fat loss noted, pt with +3 edema in RLE and LLE extremities -Low K on admit, improving with replacement -Phos WNL, Mg low; receiving replacement -CBGs WNL   Height: Ht Readings from Last 1 Encounters:  08/26/14 5\' 3"  (1.6 m)    Weight: Wt Readings from Last  1 Encounters:  08/27/14 160 lb 7.9 oz (72.8 kg)    Ideal Body Weight: 115 lb  % Ideal Body Weight: 72%  Wt Readings from Last 10 Encounters:  08/27/14 160 lb 7.9 oz (72.8 kg)  08/28/14 160 lb (72.576 kg)  08/07/14 150 lb 14.4 oz (68.448 kg)  07/27/14 171 lb (77.565 kg)  07/12/14 171 lb 9.6 oz (77.837 kg)  09/05/11 170 lb 6.4 oz (77.293 kg)  08/15/11 168 lb 9.6 oz (76.476 kg)  06/24/11 169 lb (76.658 kg)  05/30/11 163 lb 12.8 oz (74.299 kg)    Usual Body Weight: 145 lb per pt report, previous med record indicate weight ~ 170 lb  % Usual Body Weight: 94%  BMI:  Body mass index is 28.44 kg/(m^2).  Estimated Nutritional Needs: Kcal: 1450-1650 Protein: 70-80 gram Fluid: >/= 1500 ml daily  Skin:  stg 2 pressure ulcers on elbow and sacrum  Diet Order: Diet Heart  EDUCATION NEEDS: -No education needs identified at this time   Intake/Output Summary (Last 24 hours) at 08/29/14 1113 Last data filed at 08/29/14 1044  Gross per 24 hour  Intake   1160 ml  Output   1425 ml  Net   -265 ml    Last BM: PTA   Labs:   Recent Labs Lab 08/27/14 1011 08/27/14 1200 08/27/14 1739 08/28/14 0630 08/29/14 0510  NA 130*  --  128* 131* 133*  K 2.6*  --  3.0* 3.5* 3.0*  CL 82*  --  81* 87* 86*  CO2 39*  --  35* 36* 36*  BUN 15  --  11 8 5*  CREATININE 0.74  --  0.60 0.54 0.44*  CALCIUM 7.7*  --  8.0* 8.5 8.8  MG 1.8  --   --  1.9 1.4*  PHOS  --  3.4  --  1.9* 2.4  GLUCOSE 147*  --  220* 162* 84    CBG (last 3)   Recent Labs  08/28/14 2344 08/29/14 0523 08/29/14 0821  GLUCAP 178* 86 93    Scheduled Meds: . antiseptic oral rinse  7 mL Mouth Rinse BID  . aspirin  325 mg Oral Daily  . folic acid  1 mg Oral Daily  . furosemide  40 mg Intravenous BID  . imipenem-cilastatin  500 mg Intravenous Q8H  . insulin aspart  0-15 Units Subcutaneous 6 times per day  . ipratropium-albuterol  3 mL Nebulization Q6H  . levofloxacin (LEVAQUIN) IV  500 mg Intravenous Q24H  .  magnesium sulfate 1 - 4 g bolus IVPB  2 g Intravenous Once  . morphine  30 mg Oral 3 times per day  . multivitamin with minerals  1 tablet Oral Daily  . nicotine  21 mg Transdermal Q24H  . pneumococcal 23 valent vaccine  0.5 mL Intramuscular Tomorrow-1000  . potassium chloride  20 mEq Oral TID  . simvastatin  10 mg Oral QPM  . sodium chloride  10-40 mL Intracatheter Q12H  . sodium chloride  3 mL Intravenous Q12H  . thiamine  100 mg Oral Daily   Or  . thiamine  100 mg Intravenous Daily  . vancomycin  1,000 mg Intravenous Q12H    Continuous Infusions: . norepinephrine (LEVOPHED) Adult infusion Stopped (08/28/14 1000)    Past Medical History  Diagnosis Date  . Myocardial infarction 2000  . Diabetes mellitus type 2, insulin dependent   . COPD (chronic obstructive pulmonary disease)   . Hyperlipidemia   . Breast cancer   . OA (osteoarthritis) of knee   . Hypertension   . Coronary artery disease   . Asthma   . Elevated LFTs   . ETOH abuse   . Tobacco abuse   . Osteoarthritis     Past Surgical History  Procedure Laterality Date  . Breast surgery  1991    right mastectomy  . Vulvectomy  1981    partial  . Neck fusion    . Pelvic laparoscopy  2002    RSO  . Cesarean section  '78, '80, '81    x 3  . Hysteroscopy      D & C  . Cardiac catheterization  04/05/2009    EF 60%  . Lobectomy    . Tonsillectomy and adenoidectomy    . Mastectomy    . Coronary angioplasty  08/1998    x2 OF A BIFURCATION OM-1, OM-2 LESION  . Coronary angioplasty with stent placement  01/1999    MID FIRST OBTUSE MARGINAL VESSEL  . Coronary angioplasty with stent placement  07/1999    STENTING AT THE CRUX OF THE RIGHT CORONARY ARTERY WITH A 3.8MM X 18MM TETRA STENT  . Cardiovascular stress test  01/31/2005    EF 58%  . Total hip arthroplasty  08/2010    bilat    Atlee Abide MS RD LDN Clinical Dietitian [phone]

## 2014-08-29 NOTE — Progress Notes (Signed)
PROGRESS NOTE  [name] AOZ:[id number] DOB: October 06, 1945 DOA: 08/26/2014 PCP: Redmond Baseman, MD  Brief history 68 year old female with history of coronary artery disease, diabetes mellitus type 2, COPD, hyperlipidemia, tobacco use presented to the emergency department on 08/26/2014 because of left approximate weakness and dysesthesias. The patient's husband states that the patient has been confused and having hallucinations for 2 weeks prior to admission with associated generalized weakness and worsening dyspnea on exertion for the period of time. He stated that the patient was very resistant to see a physician or go to the hospital, until she developed left upper extremity weakness. The patient was found to be hypotensive with acute kidney injury. In addition, she was hyponatremic with a sodium of 121 and hypokalemic with potassium of less than 2.2. The patient was admitted to the ICU where the patient continued to be hypotensive. The patient was started on Neo-Synephrine after she received over 4 L of normal saline. The patient subsequently developed hypoxemia and was placed on a nonrebreather. Critical care medicine due to the patient's acute medical illness and need for pressor support. Cardiology was consulted due to the patient's history of ischemic heart disease and paroxysmal mitral fibrillation. Assessment/Plan: Sepsis -Likely due to pneumonia and urinary source -The patient received a dose of levofloxacin on 08/26/2014 @2340  -Unfortunately, blood cultures were not previously obtained -08/27/2014 Blood cultures 2 sets--negative so far -08/27/2014 Broaden antibiotic coverage with vancomycin and imipenem as the patient has penicillin allergy -urine culture-->EColi -appreciate CCM -Procalcitonin 0.57-->0.28--><0.10 -Patient remains on Levophed Acute respiratory failure -due to pneumonia and CHF -Concerned about volume overload now-->started lasix 08/28/14-->neg 4.6L  in past 48 hours -she had received >4L fluid -Patient remains nonrebreather -d/c IVF Pneumonia -IV abx as discussed, narrow coverage pending culture data -pulm hygiene -bronchodilators Acute Diastolic CHF -neg 4.6L in past 24 hours -daily weights - 08/28/2014 echo EF 60-65% - continue IV furosemide, monitor BMP Atrial fibrillation with RVR -Likely due to the patient's sepsis -now back in sinus -07/27/2014 Lexiscan was normal with normal EF -07/27/2014 echocardiogram EF 65-70 percent -Appreciate cardiology followup -Plans noted for repeat echocardiogram Elevated Troponin -likely demand ischemia -cardiology following AKI -due to sepsis and hemodynamic changes -improved with IVF -monitor electrolyes Alcohol Dependence - patient drank "half gallon" vodka every week x 1-80yrs -past few weeks--"4 cups" vodka daily -CIWA protocol Hyponatremia -due to volume depletion and zaroxolyn initially -pt was also on lasix prior to admission -stopped NS for now to avoid risk of osmotic demyelination syndrome as the patient's sodium has increased from 121-131 Hypokalemia -Repleted Diabetes mellitus type II -NovoLog sliding scale -08/26/2014 hemoglobin A1c 5.5 Left arm weakness -MRI brain--negative for acute findings -This has been present for one week prior to admission -Start aspirin Chronic pain with chronic opioid dependence -Decrease opioids, but were not discontinued altogether to avoid withdrawal and pt still with poorly controlled pain -restart MS Contin at lower dose and oxyIR at lower dose--discussed with husband and pt that BP is limiting opioid use -Changed to fentanyl IV Leukocytosis -Patient is presently on stress steroids  Family Communication: Husband updated at beside Disposition Plan: Home when medically stable   Procedures/Studies: Ct Head Wo Contrast  08/26/2014   CLINICAL DATA:  Progressive left upper extremity weakness over the past 1 week.  EXAM: CT  HEAD WITHOUT CONTRAST  TECHNIQUE: Contiguous axial images were obtained from the base of the skull through the vertex without intravenous contrast.  COMPARISON:  None.  FINDINGS: Mild age related cortical atrophy. Ventricular system normal in size and appearance for age. No mass lesion. No midline shift. No acute hemorrhage or hematoma. No extra-axial fluid collections. No evidence of acute infarction.  No skull fracture or other focal osseous abnormality involving the skull. Visualized paranasal sinuses, bilateral mastoid air cells and bilateral middle ear cavities well-aerated. Mild bilateral carotid siphon atherosclerosis.  IMPRESSION: 1. No acute intracranial abnormality. 2. Mild age related cortical atrophy.   Electronically Signed   By: Hulan Saas M.D.   On: 08/26/2014 17:04   Mr Brain Wo Contrast  08/28/2014   CLINICAL DATA:  Breast cancer. Increased lethargy. Difficulty holding still. Weakness of the left arm.  EXAM: MRI HEAD WITHOUT CONTRAST  TECHNIQUE: Multiplanar, multiecho pulse sequences of the brain and surrounding structures were obtained without intravenous contrast.  COMPARISON:  CT head without contrast to 08/26/2014.  FINDINGS: The study is mildly degraded by patient motion. No acute cortical infarct, hemorrhage, or mass lesion is present. Mild generalized atrophy is within normal limits for age. No significant white matter disease is evident. Flow is present in the major intracranial arteries.  No significant extraaxial fluid collection is present. The patient is status post bilateral lens replacements. The paranasal sinuses and mastoid air cells are clear.  IMPRESSION: 1. Normal MRI appearance of the brain for age.   Electronically Signed   By: Gennette Pac M.D.   On: 08/28/2014 12:39   Dg Chest Port 1 View  08/29/2014   CLINICAL DATA:  Pneumonia  EXAM: PORTABLE CHEST - 1 VIEW  COMPARISON:  Portable chest x-ray of August 27, 2014 at 10:11 a.m.  FINDINGS: There has been  progressive opacification of the inferior 1/2 of the left hemi thorax with opacification of the left heart border and the left hemidiaphragm. Patchy interstitial densities in the mid and lower right lung are more conspicuous today. The pulmonary vascularity is not engorged. The left sided PICC line tip projects over the midportion of the SVC. There is no pneumothorax. Pleural fluid is likely present on the left. There is an un healed fracture of the posterior aspect of the left eighth rib. There are surgical clips in the right axillary region.  IMPRESSION: Worsening of left lower lobe atelectasis or pneumonia over the past 2 days. Patchy increased density has developed in the right lung as well.   Electronically Signed   By: Aadil Sur  [place]   On: 08/29/2014 07:47   Dg Chest Port 1 View  08/27/2014   CLINICAL DATA:  New left-sided PICC line.  EXAM: PORTABLE CHEST - 1 VIEW  COMPARISON:  08/27/2014  FINDINGS: Left-sided PICC line has been placed, tip overlying the level of the superior vena cava. There is increasing infiltrate in the left lung base. No evidence for pneumothorax. Persistent interstitial edema. Again noted is left posterior ninth rib fracture. No pneumothorax.  IMPRESSION: 1. Interval placement of left-sided PICC line. 2. Developing left lower lobe infiltrate.   Electronically Signed   By: Rosalie Gums M.D.   On: 08/27/2014 10:43   Dg Chest Port 1 View  08/27/2014   CLINICAL DATA:  68 year old female with respiratory failure  EXAM: PORTABLE CHEST - 1 VIEW  COMPARISON:  Prior chest x-ray 06/23/2014  FINDINGS: Patchy airspace infiltrate in the left mid lung and left retrocardiac region concerning for multifocal pneumonia. Stable mild cardiomegaly. Atherosclerotic calcification present in the transverse aorta. Difficult to exclude a small left pleural effusion. No pneumothorax. Background of mild pulmonary vascular congestion  bordering on edema. Surgical clips noted in the right axilla. No acute  osseous abnormality.  IMPRESSION: 1. Patchy interstitial and airspace opacities in the left mid and lower lung concerning for pneumonia. 2. Background of superimposed pulmonary vascular congestion bordering on mild interstitial edema. 3. Stable cardiomegaly. 4. Aortic atherosclerosis.   Electronically Signed   By: Malachy Moan M.D.   On: 08/27/2014 08:52         Subjective:  patient is breathing better. Denies any fevers, chills, chest pain, nausea, vomiting, diarrhea. She complains of chronic back pain.  Objective: Filed Vitals:   08/29/14 0700 08/29/14 0800 08/29/14 0831 08/29/14 0900  BP: 123/68 116/79  115/93  Pulse: 88 74  94  Temp:  98.1 F (36.7 C)    TempSrc:  Axillary    Resp: 28 19  17   Height:      Weight:      SpO2: 95% 100% 98% 91%    Intake/Output Summary (Last 24 hours) at 08/29/14 1022 Last data filed at 08/29/14 0900  Gross per 24 hour  Intake   1140 ml  Output   1225 ml  Net    -85 ml   Weight change:  Exam:   General:  Pt is alert, follows commands appropriately, not in acute distress  HEENT: No icterus, No thrush,  [place]/AT  Cardiovascular: RRR, S1/S2, no rubs, no gallops  Respiratory:  Bilateral rales, right greater than left. No wheezing.  Abdomen: Soft/+BS, non tender, non distended, no guarding  Extremities: 1+LE edema, No lymphangitis, No petechiae, No rashes, no synovitis  Data Reviewed: Basic Metabolic Panel:  Recent Labs Lab 08/26/14 1625 08/27/14 0134 08/27/14 0410 08/27/14 1011 08/27/14 1200 08/27/14 1739 08/28/14 0630 08/29/14 0510  NA 121* 127* 131* 130*  --  128* 131* 133*  K <2.2* 2.4* 2.5* 2.6*  --  3.0* 3.5* 3.0*  CL 66* 73* 77* 82*  --  81* 87* 86*  CO2 41* 41* 33* 39*  --  35* 36* 36*  GLUCOSE 138* 90 84 147*  --  220* 162* 84  BUN 26* 23 21 15   --  11 8 5*  CREATININE 1.82* 1.30* 1.12* 0.74  --  0.60 0.54 0.44*  CALCIUM 8.5 8.0* 7.7* 7.7*  --  8.0* 8.5 8.8  MG 1.7 1.5  --  1.8  --   --  1.9 1.4*  PHOS  --    --   --   --  3.4  --  1.9* 2.4   Liver Function Tests:  Recent Labs Lab 08/26/14 1625 08/27/14 0134  AST 90* 75*  ALT 28 23  ALKPHOS 368* 297*  BILITOT 0.7 0.5  PROT 7.0 5.8*  ALBUMIN 2.5* 1.9*   No results for input(s): LIPASE, AMYLASE in the last 168 hours. No results for input(s): AMMONIA in the last 168 hours. CBC:  Recent Labs Lab 08/26/14 1625 08/27/14 0134 08/28/14 0530 08/29/14 0510  WBC 14.1* 9.8 12.7* 12.0*  NEUTROABS 11.5* 7.4 11.3* 9.7*  HGB 11.1* 9.7* 9.8* 9.5*  HCT 31.1* 27.5* 28.0* 28.0*  MCV 101.3* 100.7* 102.9* 103.7*  PLT 265 212 267 257   Cardiac Enzymes:  Recent Labs Lab 08/27/14 0445 08/27/14 1011 08/27/14 1611  TROPONINI <0.30 0.53* 1.20*   BNP: Invalid input(s): POCBNP CBG:  Recent Labs Lab 08/28/14 1709 08/28/14 2025 08/28/14 2344 08/29/14 0523 08/29/14 0821  GLUCAP 97 107* 178* 86 93    Recent Results (from the past 240 hour(s))  MRSA PCR Screening  Status: None   Collection Time: 08/26/14  9:40 PM  Result Value Ref Range Status   MRSA by PCR NEGATIVE NEGATIVE Final    Comment:        The GeneXpert MRSA Assay (FDA approved for NASAL specimens only), is one component of a comprehensive MRSA colonization surveillance program. It is not intended to diagnose MRSA infection nor to guide or monitor treatment for MRSA infections.   Culture, Urine     Status: None (Preliminary result)   Collection Time: 08/26/14 11:34 PM  Result Value Ref Range Status   Specimen Description URINE, RANDOM  Final   Special Requests NONE  Final   Culture  Setup Time   Final    08/27/2014 11:19 Performed at [facility] Count   Final    >=100,000 COLONIES/ML Performed at [facility]    Culture   Final    ESCHERICHIA COLI Performed at [facility]    Report Status PENDING  Incomplete  Culture, blood (routine x 2)     Status: None (Preliminary result)   Collection Time: 08/27/14  8:30 AM    Result Value Ref Range Status   Specimen Description BLOOD LEFT HAND  Final   Special Requests BOTTLES DRAWN AEROBIC ONLY 1 CC  Final   Culture  Setup Time   Final    08/27/2014 19:25 Performed at [facility]    Culture   Final           BLOOD CULTURE RECEIVED NO GROWTH TO DATE CULTURE WILL BE HELD FOR 5 DAYS BEFORE ISSUING A FINAL NEGATIVE REPORT Note: Culture results may be compromised due to an inadequate volume of blood received in culture bottles. Performed at [facility]    Report Status PENDING  Incomplete  Culture, blood (routine x 2)     Status: None (Preliminary result)   Collection Time: 08/27/14  8:37 AM  Result Value Ref Range Status   Specimen Description BLOOD LEFT HAND  Final   Special Requests BOTTLES DRAWN AEROBIC ONLY 4CC  Final   Culture  Setup Time   Final    08/27/2014 19:25 Performed at [facility]    Culture   Final           BLOOD CULTURE RECEIVED NO GROWTH TO DATE CULTURE WILL BE HELD FOR 5 DAYS BEFORE ISSUING A FINAL NEGATIVE REPORT Performed at [facility]    Report Status PENDING  Incomplete     Scheduled Meds: . antiseptic oral rinse  7 mL Mouth Rinse BID  . aspirin  325 mg Oral Daily  . furosemide  40 mg Intravenous BID  . imipenem-cilastatin  500 mg Intravenous Q8H  . insulin aspart  0-15 Units Subcutaneous 6 times per day  . ipratropium-albuterol  3 mL Nebulization Q6H  . levofloxacin (LEVAQUIN) IV  500 mg Intravenous Q24H  . morphine  30 mg Oral Q12H  . nicotine  21 mg Transdermal Q24H  . pneumococcal 23 valent vaccine  0.5 mL Intramuscular Tomorrow-1000  . potassium chloride  20 mEq Oral TID  . simvastatin  10 mg Oral QPM  . sodium chloride  10-40 mL Intracatheter Q12H  . sodium chloride  3 mL Intravenous Q12H  . vancomycin  1,000 mg Intravenous Q12H   Continuous Infusions: . norepinephrine (LEVOPHED) Adult infusion Stopped (08/28/14 1000)     Izamar Linden, DO  [facility] Pager  [phone]  If 7PM-7AM, please contact night-coverage [email or web address]  Password [email or web address] 08/29/2014, 10:22 AM   LOS: 3 days

## 2014-08-30 DIAGNOSIS — N179 Acute kidney failure, unspecified: Secondary | ICD-10-CM | POA: Insufficient documentation

## 2014-08-30 DIAGNOSIS — E440 Moderate protein-calorie malnutrition: Secondary | ICD-10-CM

## 2014-08-30 LAB — GLUCOSE, CAPILLARY
GLUCOSE-CAPILLARY: 89 mg/dL (ref 70–99)
GLUCOSE-CAPILLARY: 96 mg/dL (ref 70–99)
Glucose-Capillary: 99 mg/dL (ref 70–99)
Glucose-Capillary: 105 mg/dL — ABNORMAL HIGH (ref 70–99)
Glucose-Capillary: 109 mg/dL — ABNORMAL HIGH (ref 70–99)

## 2014-08-30 LAB — CBC WITH DIFFERENTIAL/PLATELET
BASOS ABS: 0 10*3/uL (ref 0.0–0.1)
Basophils Relative: 0 % (ref 0–1)
Eosinophils Absolute: 0.1 10*3/uL (ref 0.0–0.7)
Eosinophils Relative: 1 % (ref 0–5)
HCT: 24.3 % — ABNORMAL LOW (ref 36.0–46.0)
Hemoglobin: 8.6 g/dL — ABNORMAL LOW (ref 12.0–15.0)
LYMPHS ABS: 1.3 10*3/uL (ref 0.7–4.0)
Lymphocytes Relative: 14 % (ref 12–46)
MCH: 36.3 pg — ABNORMAL HIGH (ref 26.0–34.0)
MCHC: 35.4 g/dL (ref 30.0–36.0)
MCV: 102.5 fL — ABNORMAL HIGH (ref 78.0–100.0)
Monocytes Absolute: 0.8 10*3/uL (ref 0.1–1.0)
Monocytes Relative: 9 % (ref 3–12)
NEUTROS ABS: 7.1 10*3/uL (ref 1.7–7.7)
NEUTROS PCT: 76 % (ref 43–77)
PLATELETS: 205 10*3/uL (ref 150–400)
RBC: 2.37 MIL/uL — ABNORMAL LOW (ref 3.87–5.11)
RDW: 13.5 % (ref 11.5–15.5)
WBC: 9.3 10*3/uL (ref 4.0–10.5)

## 2014-08-30 LAB — BASIC METABOLIC PANEL
ANION GAP: 9 (ref 5–15)
BUN: 5 mg/dL — ABNORMAL LOW (ref 6–23)
CHLORIDE: 88 meq/L — AB (ref 96–112)
CO2: 36 mEq/L — ABNORMAL HIGH (ref 19–32)
Calcium: 8.1 mg/dL — ABNORMAL LOW (ref 8.4–10.5)
Creatinine, Ser: 0.42 mg/dL — ABNORMAL LOW (ref 0.50–1.10)
GFR calc non Af Amer: >90 mL/min (ref >90)
Glucose, Bld: 89 mg/dL (ref 70–99)
Potassium: 2.3 mEq/L — CL (ref 3.7–5.3)
Sodium: 133 mEq/L — ABNORMAL LOW (ref 137–147)

## 2014-08-30 LAB — POTASSIUM: Potassium: 2.8 mEq/L — CL (ref 3.7–5.3)

## 2014-08-30 LAB — PHOSPHORUS: Phosphorus: 3.3 mg/dL (ref 2.3–4.6)

## 2014-08-30 LAB — MAGNESIUM: MAGNESIUM: 1.5 mg/dL (ref 1.5–2.5)

## 2014-08-30 MED ORDER — IPRATROPIUM-ALBUTEROL 0.5-2.5 (3) MG/3ML IN SOLN
3.0000 mL | Freq: Three times a day (TID) | RESPIRATORY_TRACT | Status: DC
  Administered 2014-08-31 – 2014-09-01 (×6): 3 mL via RESPIRATORY_TRACT
  Filled 2014-08-30 (×6): qty 3

## 2014-08-30 MED ORDER — POTASSIUM CHLORIDE 10 MEQ/100ML IV SOLN
10.0000 meq | INTRAVENOUS | Status: DC
  Administered 2014-08-30 (×4): 10 meq via INTRAVENOUS
  Filled 2014-08-30: qty 100

## 2014-08-30 MED ORDER — POTASSIUM CHLORIDE CRYS ER 20 MEQ PO TBCR
40.0000 meq | EXTENDED_RELEASE_TABLET | Freq: Three times a day (TID) | ORAL | Status: DC
  Administered 2014-08-30 (×3): 40 meq via ORAL
  Filled 2014-08-30 (×3): qty 2

## 2014-08-30 MED ORDER — POTASSIUM CHLORIDE 10 MEQ/100ML IV SOLN
10.0000 meq | Freq: Once | INTRAVENOUS | Status: AC
  Administered 2014-08-30: 10 meq via INTRAVENOUS

## 2014-08-30 MED ORDER — OXYCODONE HCL 5 MG PO TABS
15.0000 mg | ORAL_TABLET | Freq: Four times a day (QID) | ORAL | Status: DC | PRN
  Administered 2014-08-30 – 2014-09-02 (×12): 15 mg via ORAL
  Filled 2014-08-30 (×12): qty 3

## 2014-08-30 MED ORDER — MAGNESIUM SULFATE 2 GM/50ML IV SOLN
2.0000 g | Freq: Once | INTRAVENOUS | Status: AC
  Administered 2014-08-30: 2 g via INTRAVENOUS
  Filled 2014-08-30: qty 50

## 2014-08-30 MED ORDER — POTASSIUM CHLORIDE 10 MEQ/100ML IV SOLN: 10.0000 meq | INTRAVENOUS | Status: DC

## 2014-08-30 MED ORDER — POTASSIUM CHLORIDE 10 MEQ/100ML IV SOLN
10.0000 meq | INTRAVENOUS | Status: AC
Start: 2014-08-30 — End: 2014-08-30
  Administered 2014-08-30 (×4): 10 meq via INTRAVENOUS
  Filled 2014-08-30 (×4): qty 100

## 2014-08-30 NOTE — Progress Notes (Signed)
    Subjective:  Feeling better. Breathing improved. No chest pain.  Objective:  Vital Signs in the last 24 hours: Temp:  [98.1 F (36.7 C)-98.8 F (37.1 C)] 98.8 F (37.1 C) (12/16 0400) Pulse Rate:  [60-100] 68 (12/16 0600) Resp:  [14-34] 17 (12/16 0600) BP: (103-125)/(49-93) 112/57 mmHg (12/16 0600) SpO2:  [83 %-100 %] 100 % (12/16 0600) FiO2 (%):  [100 %] 100 % (12/15 1100) Weight:  [146 lb 6.2 oz (66.4 kg)] 146 lb 6.2 oz (66.4 kg) (12/16 0400)  Intake/Output from previous day: 12/15 0701 - 12/16 0700 In: 1590 [P.O.:50; I.V.:440; IV Piggyback:1100] Out: 4290 [Urine:4290]  Physical Exam: Pt is alert and oriented, NAD HEENT: normal Neck: JVP - normal Lungs: decreased BS on left CV: RRR without murmur or gallop Abd: soft, NT, Positive BS, no hepatomegaly Ext: 1+ edema, improved from previous exams Skin: warm/dry no rash   Lab Results:  Recent Labs  08/29/14 0510 08/30/14 0400  WBC 12.0* 9.3  HGB 9.5* 8.6*  PLT 257 205    Recent Labs  08/29/14 0510 08/30/14 0400  NA 133* 133*  K 3.0* 2.3*  CL 86* 88*  CO2 36* 36*  GLUCOSE 84 89  BUN 5* 5*  CREATININE 0.44* 0.42*    Recent Labs  08/27/14 1011 08/27/14 1611  TROPONINI 0.53* 1.20*   Tele: Sinus rhythm, personally reviewed.   Assessment/Plan:  1. Acute hypoxemic respiratory failure, likely secondary to pneumonia and diastoilc CHF - improving 2. Shock, septic 3. Coronary artery disease status post multiple PCI procedures with elevated troponin in the setting of acute medical illness, likely demand ischemia 4. Paroxysmal atrial fibrillation, now maintaining sinus rhythm. 5. Acute diastolic heart failure, improved 6. Hypokalemia 7. Hypomagnesemia  Pt's volume status improved, diuresed over last 24 hours with > 4L urine output. However, now with marked hypokalemia. Would hold lasix today and resume 40 mg oral lasix daily tomorrow. Replete K and Mg. Otherwise appears stable and seems to be improving.  Much more comfortable now on O2 per nasal cannula.   Sherren Mocha, M.D. 08/30/2014, 7:25 AM

## 2014-08-30 NOTE — Plan of Care (Signed)
Problem: Phase I Progression Outcomes Goal: Voiding-avoid urinary catheter unless indicated Outcome: Progressing Pt refused to have foley d/c ,reminded of increased risk for UTI . Dr Dyann Kief notifed.

## 2014-08-30 NOTE — Progress Notes (Signed)
PROGRESS NOTE  [name] ION:[id number] DOB: 13-Dec-1945 DOA: 08/26/2014 PCP: Redmond Baseman, MD  Brief history 68 year old female with history of coronary artery disease, diabetes mellitus type 2, COPD, hyperlipidemia, tobacco use presented to the emergency department on 08/26/2014 because of left approximate weakness and dysesthesias. The patient's husband states that the patient has been confused and having hallucinations for 2 weeks prior to admission with associated generalized weakness and worsening dyspnea on exertion for the period of time. He stated that the patient was very resistant to see a physician or go to the hospital, until she developed left upper extremity weakness. The patient was found to be hypotensive with acute kidney injury. In addition, she was hyponatremic with a sodium of 121 and hypokalemic with potassium of less than 2.2. The patient was admitted to the ICU where the patient continued to be hypotensive. The patient was started on Neo-Synephrine after she received over 4 L of normal saline. The patient subsequently developed hypoxemia and was placed on a nonrebreather. Critical care medicine due to the patient's acute medical illness and need for pressor support. Cardiology was consulted due to the patient's history of ischemic heart disease and paroxysmal mitral fibrillation.  Assessment/Plan: Sepsis -Likely due to pneumonia and UTI -The patient received a dose of levofloxacin on 08/26/2014 @2340  -Unfortunately, blood cultures were not previously obtained -08/27/2014 Blood cultures 2 sets--negative so far -urine culture-->positive for E. Coli -appreciate CCM inputs and recommendations  -Procalcitonin 0.57-->0.28--><0.10 -Patient is now off Levophed and with good BP -will narrow antibiotics to levaquin now base on culture data   Acute respiratory failure -due to pneumonia and acute on chronic CHF -significantly improved with  diuresis -continue pulmonary hygiene and oxygen supplementation -mild effusion on CXR but not enough for thoracentesis to be done  Pneumonia -IV abx as discussed, narrow coverage pending culture data -continue pulm hygiene -continue bronchodilators  Acute on chronic Diastolic CHF -neg >4.6L in past 36 hours -continue daily weights; strict I's and O's and low sodium diet -will continue diuresis as recommended by cardiology -replete electrolytes as needed  Atrial fibrillation with RVR -Likely due to the patient's sepsis -now back in sinus -07/27/2014 Lexiscan was normal with normal EF -07/27/2014 echocardiogram EF 65-70 percent -Appreciate cardiology followup -will follow results of repeated echocardiogram  Elevated Troponin -likely demand ischemia -cardiology following; will follow rec's  AKI -due to UTI, hypotension associated with sepsis and hemodynamic changes -improved with IVF -will monitor electrolyes  Alcohol Dependence - patient drank "half gallon" vodka every week x 1-53yrs -past few weeks--"4 cups" vodka daily -continue CIWA protocol  Hyponatremia -due to volume depletion and zaroxolyn initially -pt was also on lasix prior to admission -will monitor  Hypokalemia -will replete as needed  Diabetes mellitus type II -NovoLog sliding scale -08/26/2014 hemoglobin A1c 5.5  Left arm weakness -MRI brain--negative for acute findings -continue ASA  Chronic pain with chronic opioid dependence -Continue MS Contin at lower dose and oxyIR at lower dose--discussed with husband and pt that BP is limiting opioid use -fentanyl disocntinued  Leukocytosis -WBC's down to normal range now -no fever -will monitor  Family Communication: Husband updated at beside Disposition Plan: Home when medically stable   Procedures/Studies: Ct Head Wo Contrast  08/26/2014   CLINICAL DATA:  Progressive left upper extremity weakness over the past 1 week.  EXAM: CT HEAD  WITHOUT CONTRAST  TECHNIQUE: Contiguous axial images were obtained from the base of the skull through  the vertex without intravenous contrast.  COMPARISON:  None.  FINDINGS: Mild age related cortical atrophy. Ventricular system normal in size and appearance for age. No mass lesion. No midline shift. No acute hemorrhage or hematoma. No extra-axial fluid collections. No evidence of acute infarction.  No skull fracture or other focal osseous abnormality involving the skull. Visualized paranasal sinuses, bilateral mastoid air cells and bilateral middle ear cavities well-aerated. Mild bilateral carotid siphon atherosclerosis.  IMPRESSION: 1. No acute intracranial abnormality. 2. Mild age related cortical atrophy.   Electronically Signed   By: Hulan Saas M.D.   On: 08/26/2014 17:04   Ct Chest Wo Contrast  08/29/2014   CLINICAL DATA:  Persistent cough, shortness of breath.  EXAM: CT CHEST WITHOUT CONTRAST  TECHNIQUE: Multidetector CT imaging of the chest was performed following the standard protocol without IV contrast.  COMPARISON:  Radiographs of same date ; CT scan of August 06, 2011.  FINDINGS: No pneumothorax is noted. Moderate left pleural effusion is noted with associated atelectasis of the left lower lobe. Multiple focal airspace opacities are noted throughout both lungs most consistent with multifocal pneumonia. This is most prominently seen posteriorly in the right lower lobe. Left perihilar airspace opacity is noted in the left upper lobe with associated air bronchograms most consistent with perihilar pneumonia. Coronary artery calcifications are noted. Minimal pericardial effusion is noted. Minimally enlarged precarinal lymph node is noted which is not significantly changed compared to prior exam and therefore most likely reactive in etiology. Atherosclerotic calcifications of thoracic aorta are noted. No definite aneurysm is noted. Left-sided PICC line is noted with distal tip at the cavoatrial  junction. Old left rib fractures are noted.  Abnormal low density is noted in portions of the right hepatic lobe which may represent fatty infiltration, but CT scan of the liver with contrast administration is recommended for further evaluation. The remaining portion of the visualized upper abdomen appears normal.  IMPRESSION: Moderate left pleural effusion is noted with associated atelectasis of the left lower lobe.  Multiple focal airspace opacities are noted throughout both lungs most consistent with multifocal pneumonia. This is most prominently seen in the right lower lobe. Also noted is perihilar airspace opacity in the left upper lobe with associated air bronchograms also consistent with pneumonia. Followup CT scan in 2-3 weeks following antibiotic treatment for pneumonia is recommended to ensure resolution and rule out underlying neoplasm.  Abnormal segmental low density is noted in the right hepatic lobe which may simply represent fatty infiltration, but further evaluation with CT scan with contrast is recommended to rule out neoplasm or other pathology.   Electronically Signed   By: Roque Lias M.D.   On: 08/29/2014 18:06   Mr Brain Wo Contrast  08/28/2014   CLINICAL DATA:  Breast cancer. Increased lethargy. Difficulty holding still. Weakness of the left arm.  EXAM: MRI HEAD WITHOUT CONTRAST  TECHNIQUE: Multiplanar, multiecho pulse sequences of the brain and surrounding structures were obtained without intravenous contrast.  COMPARISON:  CT head without contrast to 08/26/2014.  FINDINGS: The study is mildly degraded by patient motion. No acute cortical infarct, hemorrhage, or mass lesion is present. Mild generalized atrophy is within normal limits for age. No significant white matter disease is evident. Flow is present in the major intracranial arteries.  No significant extraaxial fluid collection is present. The patient is status post bilateral lens replacements. The paranasal sinuses and mastoid air  cells are clear.  IMPRESSION: 1. Normal MRI appearance of the brain for age.  Electronically Signed   By: Gennette Pac M.D.   On: 08/28/2014 12:39   Dg Chest Port 1 View  08/29/2014   CLINICAL DATA:  Pneumonia  EXAM: PORTABLE CHEST - 1 VIEW  COMPARISON:  Portable chest x-ray of August 27, 2014 at 10:11 a.m.  FINDINGS: There has been progressive opacification of the inferior 1/2 of the left hemi thorax with opacification of the left heart border and the left hemidiaphragm. Patchy interstitial densities in the mid and lower right lung are more conspicuous today. The pulmonary vascularity is not engorged. The left sided PICC line tip projects over the midportion of the SVC. There is no pneumothorax. Pleural fluid is likely present on the left. There is an un healed fracture of the posterior aspect of the left eighth rib. There are surgical clips in the right axillary region.  IMPRESSION: Worsening of left lower lobe atelectasis or pneumonia over the past 2 days. Patchy increased density has developed in the right lung as well.   Electronically Signed   By: David  [place]   On: 08/29/2014 07:47   Dg Chest Port 1 View  08/27/2014   CLINICAL DATA:  New left-sided PICC line.  EXAM: PORTABLE CHEST - 1 VIEW  COMPARISON:  08/27/2014  FINDINGS: Left-sided PICC line has been placed, tip overlying the level of the superior vena cava. There is increasing infiltrate in the left lung base. No evidence for pneumothorax. Persistent interstitial edema. Again noted is left posterior ninth rib fracture. No pneumothorax.  IMPRESSION: 1. Interval placement of left-sided PICC line. 2. Developing left lower lobe infiltrate.   Electronically Signed   By: Rosalie Gums M.D.   On: 08/27/2014 10:43   Dg Chest Port 1 View  08/27/2014   CLINICAL DATA:  68 year old female with respiratory failure  EXAM: PORTABLE CHEST - 1 VIEW  COMPARISON:  Prior chest x-ray 06/23/2014  FINDINGS: Patchy airspace infiltrate in the left mid lung and  left retrocardiac region concerning for multifocal pneumonia. Stable mild cardiomegaly. Atherosclerotic calcification present in the transverse aorta. Difficult to exclude a small left pleural effusion. No pneumothorax. Background of mild pulmonary vascular congestion bordering on edema. Surgical clips noted in the right axilla. No acute osseous abnormality.  IMPRESSION: 1. Patchy interstitial and airspace opacities in the left mid and lower lung concerning for pneumonia. 2. Background of superimposed pulmonary vascular congestion bordering on mild interstitial edema. 3. Stable cardiomegaly. 4. Aortic atherosclerosis.   Electronically Signed   By: Malachy Moan M.D.   On: 08/27/2014 08:52     Subjective: Patient is breathing better. Denies any fevers and CP.  Objective: Filed Vitals:   08/30/14 0800 08/30/14 0834 08/30/14 0856 08/30/14 1000  BP: 118/70   100/75  Pulse: 79  83 82  Temp:      TempSrc:      Resp: 16  19 17   Height:      Weight:      SpO2: 99% 100% 100% 98%    Intake/Output Summary (Last 24 hours) at 08/30/14 1049 Last data filed at 08/30/14 1000  Gross per 24 hour  Intake   1700 ml  Output   3140 ml  Net  -1440 ml   Weight change:  Exam:   General:  Pt is alert, follows commands appropriately, not in acute distress; breathing easier. No fever  HEENT: No icterus, No thrush,  [place]/AT  Cardiovascular: RRR, S1/S2, no rubs, no gallops  Respiratory:  Bilateral rales, right greater than left. No wheezing.  Abdomen: Soft/+BS, non tender, non distended, no guarding  Extremities: trace to 1+LE edema, No lymphangitis, No petechiae, No rashes, no synovitis  Data Reviewed: Basic Metabolic Panel:  Recent Labs Lab 08/27/14 0134  08/27/14 1011 08/27/14 1200 08/27/14 1739 08/28/14 0630 08/29/14 0510 08/30/14 0400  NA 127*  < > 130*  --  128* 131* 133* 133*  K 2.4*  < > 2.6*  --  3.0* 3.5* 3.0* 2.3*  CL 73*  < > 82*  --  81* 87* 86* 88*  CO2 41*  < > 39*  --   35* 36* 36* 36*  GLUCOSE 90  < > 147*  --  220* 162* 84 89  BUN 23  < > 15  --  11 8 5* 5*  CREATININE 1.30*  < > 0.74  --  0.60 0.54 0.44* 0.42*  CALCIUM 8.0*  < > 7.7*  --  8.0* 8.5 8.8 8.1*  MG 1.5  --  1.8  --   --  1.9 1.4* 1.5  PHOS  --   --   --  3.4  --  1.9* 2.4 3.3  < > = values in this interval not displayed. Liver Function Tests:  Recent Labs Lab 08/26/14 1625 08/27/14 0134  AST 90* 75*  ALT 28 23  ALKPHOS 368* 297*  BILITOT 0.7 0.5  PROT 7.0 5.8*  ALBUMIN 2.5* 1.9*   CBC:  Recent Labs Lab 08/26/14 1625 08/27/14 0134 08/28/14 0530 08/29/14 0510 08/30/14 0400  WBC 14.1* 9.8 12.7* 12.0* 9.3  NEUTROABS 11.5* 7.4 11.3* 9.7* 7.1  HGB 11.1* 9.7* 9.8* 9.5* 8.6*  HCT 31.1* 27.5* 28.0* 28.0* 24.3*  MCV 101.3* 100.7* 102.9* 103.7* 102.5*  PLT 265 212 267 257 205   Cardiac Enzymes:  Recent Labs Lab 08/27/14 0445 08/27/14 1011 08/27/14 1611  TROPONINI <0.30 0.53* 1.20*   BNP: Invalid input(s): POCBNP CBG:  Recent Labs Lab 08/29/14 1604 08/29/14 1953 08/29/14 2318 08/30/14 0315 08/30/14 0756  GLUCAP 107* 122* 91 89 96    Recent Results (from the past 240 hour(s))  MRSA PCR Screening     Status: None   Collection Time: 08/26/14  9:40 PM  Result Value Ref Range Status   MRSA by PCR NEGATIVE NEGATIVE Final    Comment:        The GeneXpert MRSA Assay (FDA approved for NASAL specimens only), is one component of a comprehensive MRSA colonization surveillance program. It is not intended to diagnose MRSA infection nor to guide or monitor treatment for MRSA infections.   Culture, Urine     Status: None   Collection Time: 08/26/14 11:34 PM  Result Value Ref Range Status   Specimen Description URINE, RANDOM  Final   Special Requests NONE  Final   Culture  Setup Time   Final    08/27/2014 11:19 Performed at [facility] Count   Final    >=100,000 COLONIES/ML Performed at [facility]    Culture   Final     ESCHERICHIA COLI Performed at [facility]    Report Status 08/29/2014 FINAL  Final   Organism ID, Bacteria ESCHERICHIA COLI  Final      Susceptibility   Escherichia coli - MIC*    AMPICILLIN 8 SENSITIVE Sensitive     CEFAZOLIN <=4 SENSITIVE Sensitive     CEFTRIAXONE <=1 SENSITIVE Sensitive     CIPROFLOXACIN <=0.25 SENSITIVE Sensitive     GENTAMICIN <=1 SENSITIVE Sensitive  LEVOFLOXACIN <=0.12 SENSITIVE Sensitive     NITROFURANTOIN <=16 SENSITIVE Sensitive     TOBRAMYCIN <=1 SENSITIVE Sensitive     TRIMETH/SULFA <=20 SENSITIVE Sensitive     PIP/TAZO <=4 SENSITIVE Sensitive     * ESCHERICHIA COLI  Culture, blood (routine x 2)     Status: None (Preliminary result)   Collection Time: 08/27/14  8:30 AM  Result Value Ref Range Status   Specimen Description BLOOD LEFT HAND  Final   Special Requests BOTTLES DRAWN AEROBIC ONLY 1 CC  Final   Culture  Setup Time   Final    08/27/2014 19:25 Performed at [facility]    Culture   Final           BLOOD CULTURE RECEIVED NO GROWTH TO DATE CULTURE WILL BE HELD FOR 5 DAYS BEFORE ISSUING A FINAL NEGATIVE REPORT Note: Culture results may be compromised due to an inadequate volume of blood received in culture bottles. Performed at [facility]    Report Status PENDING  Incomplete  Culture, blood (routine x 2)     Status: None (Preliminary result)   Collection Time: 08/27/14  8:37 AM  Result Value Ref Range Status   Specimen Description BLOOD LEFT HAND  Final   Special Requests BOTTLES DRAWN AEROBIC ONLY 4CC  Final   Culture  Setup Time   Final    08/27/2014 19:25 Performed at [facility]    Culture   Final           BLOOD CULTURE RECEIVED NO GROWTH TO DATE CULTURE WILL BE HELD FOR 5 DAYS BEFORE ISSUING A FINAL NEGATIVE REPORT Performed at [facility]    Report Status PENDING  Incomplete     Scheduled Meds: . antiseptic oral rinse  7 mL Mouth Rinse BID  . aspirin  325 mg Oral Daily  . folic  acid  1 mg Oral Daily  . imipenem-cilastatin  500 mg Intravenous Q8H  . insulin aspart  0-15 Units Subcutaneous 6 times per day  . ipratropium-albuterol  3 mL Nebulization Q6H  . levofloxacin (LEVAQUIN) IV  750 mg Intravenous Q24H  . morphine  30 mg Oral 3 times per day  . multivitamin with minerals  1 tablet Oral Daily  . nicotine  21 mg Transdermal Q24H  . pneumococcal 23 valent vaccine  0.5 mL Intramuscular Tomorrow-1000  . potassium chloride  40 mEq Oral TID  . simvastatin  10 mg Oral QPM  . sodium chloride  10-40 mL Intracatheter Q12H  . sodium chloride  3 mL Intravenous Q12H  . thiamine  100 mg Oral Daily   Or  . thiamine  100 mg Intravenous Daily   Continuous Infusions: . norepinephrine (LEVOPHED) Adult infusion Stopped (08/28/14 1000)     Time: >30 minutes   Vassie Loll, MD [facility] Pager [phone]  If 7PM-7AM, please contact night-coverage [email or web address] Password [email or web address] 08/30/2014, 10:49 AM   LOS: 4 days

## 2014-08-30 NOTE — Progress Notes (Signed)
PULMONARY / CRITICAL CARE MEDICINE   Name: [name] MRN: [id number] DOB: 1946-03-06   PCP Redmond Baseman, MD LOS 4 days  ADMISSION DATE:  08/26/2014 CONSULTATION DATE:  08/27/14  REFERRING MD :  Dr Onalee Hua Tat of [facility]  CHIEF COMPLAINT:  Circulatory shock  INITIAL PRESENTATION:  68 yo female with dm2, hypertension, Copd, chronic pain (MS contin 30mg  bid, neurotiin 600 tid, diclofenac, oxycodone prn) not on home o2 apparently c/o weakness in the left arm for about one week prior to admission but worse on day of admit 08/26/2014.  Pt therefore presented to the ED and was noted to have severe hypokalemia. CT scan brain negative. Pt also noted to be hyponatremic and with acute renal failure. Pt notes slight numbness, tingling in the 1st and 2nd finger left hand. Pt notes that her bp medication was adjusted somewhat recently. Pt taken off clonidine and placed on another medication. Pt denies nsaid use. Pt  admitted for hypokalemia, hyponatremia, and acute renal failure on 08/26/2014. Subsequently becamse hypotensive needing Neosynephrine (despite 4L fluids)and also hypoxemic needing NRB with CXR showing LLL consolidation and UA suggesting UTI. She also had transint A Fib. On 08/27/14 due to continued pressors PCCM consulted   SIGNIFICANT EVENTS: 08/26/2014 - admit 08/27/14 - PCCM consult for pressor need   SUBJECTIVE:  Feels  better  VITAL SIGNS: Temp:  [97.8 F (36.6 C)-98.8 F (37.1 C)] 97.8 F (36.6 C) (12/16 0700) Pulse Rate:  [60-100] 83 (12/16 0856) Resp:  [14-34] 19 (12/16 0856) BP: (103-125)/(49-83) 118/70 mmHg (12/16 0800) SpO2:  [83 %-100 %] 100 % (12/16 0856) FiO2 (%):  [100 %] 100 % (12/15 1100) Weight:  [66.4 kg (146 lb 6.2 oz)] 66.4 kg (146 lb 6.2 oz) (12/16 0400) 4 liters   CVP:  [0 mmHg-9 mmHg] 0 mmHg INTAKE / OUTPUT:  Intake/Output Summary (Last 24 hours) at 08/30/14 6440 Last data filed at 08/30/14 0800  Gross per 24 hour  Intake   1840 ml   Output   3240 ml  Net  -1400 ml    PHYSICAL EXAMINATION: General:  Chronically ill looking deconditioned female. Looks much older than stated age, looks better,. No distress  Neuro:  Currently awake and appropriate. C/o chronic pain HEENT:  Supple neck Cardiovascular:  S1S2+. No murmurs Lungs:  No accessory muscle use. Decreased on left Abdomen:  Soft, non tender, no organomegaly Musculoskeletal:  Chronic pedal edema + Skin:  Left forearm bruised +  LABS:  PULMONARY  Recent Labs Lab 08/27/14 0844 08/27/14 1500  PHART 7.520* 7.529*  PCO2ART 48.7* 44.3  PO2ART 68.9* 56.7*  HCO3 39.5* 36.7*  TCO2 35.9 32.0  O2SAT 93.8 89.3    CBC  Recent Labs Lab 08/28/14 0530 08/29/14 0510 08/30/14 0400  HGB 9.8* 9.5* 8.6*  HCT 28.0* 28.0* 24.3*  WBC 12.7* 12.0* 9.3  PLT 267 257 205    COAGULATION  Recent Labs Lab 08/26/14 1625  INR 0.94    CARDIAC    Recent Labs Lab 08/27/14 0445 08/27/14 1011 08/27/14 1611  TROPONINI <0.30 0.53* 1.20*    Recent Labs Lab 08/27/14 1611 08/28/14 0530  PROBNP 5589.0* 8165.0*   CHEMISTRY  Recent Labs Lab 08/27/14 0134  08/27/14 1011 08/27/14 1200 08/27/14 1739 08/28/14 0630 08/29/14 0510 08/30/14 0400  NA 127*  < > 130*  --  128* 131* 133* 133*  K 2.4*  < > 2.6*  --  3.0* 3.5* 3.0* 2.3*  CL 73*  < > 82*  --  81* 87* 86* 88*  CO2 41*  < > 39*  --  35* 36* 36* 36*  GLUCOSE 90  < > 147*  --  220* 162* 84 89  BUN 23  < > 15  --  11 8 5* 5*  CREATININE 1.30*  < > 0.74  --  0.60 0.54 0.44* 0.42*  CALCIUM 8.0*  < > 7.7*  --  8.0* 8.5 8.8 8.1*  MG 1.5  --  1.8  --   --  1.9 1.4* 1.5  PHOS  --   --   --  3.4  --  1.9* 2.4 3.3  < > = values in this interval not displayed. Estimated Creatinine Clearance: 63.3 mL/min (by C-G formula based on Cr of 0.42).   LIVER  Recent Labs Lab 08/26/14 1625 08/27/14 0134  AST 90* 75*  ALT 28 23  ALKPHOS 368* 297*  BILITOT 0.7 0.5  PROT 7.0 5.8*  ALBUMIN 2.5* 1.9*  INR 0.94   --     INFECTIOUS  Recent Labs Lab 08/27/14 0445 08/27/14 1315 08/28/14 0530 08/29/14 0510  LATICACIDVEN 2.7* 1.5 1.8  --   PROCALCITON 0.57  --  0.28 <0.10   ENDOCRINE CBG (last 3)   Recent Labs  08/29/14 2318 08/30/14 0315 08/30/14 0756  GLUCAP 91 89 96    IMAGING x48h Ct Chest Wo Contrast  08/29/2014   CLINICAL DATA:  Persistent cough, shortness of breath.  EXAM: CT CHEST WITHOUT CONTRAST  TECHNIQUE: Multidetector CT imaging of the chest was performed following the standard protocol without IV contrast.  COMPARISON:  Radiographs of same date ; CT scan of August 06, 2011.  FINDINGS: No pneumothorax is noted. Moderate left pleural effusion is noted with associated atelectasis of the left lower lobe. Multiple focal airspace opacities are noted throughout both lungs most consistent with multifocal pneumonia. This is most prominently seen posteriorly in the right lower lobe. Left perihilar airspace opacity is noted in the left upper lobe with associated air bronchograms most consistent with perihilar pneumonia. Coronary artery calcifications are noted. Minimal pericardial effusion is noted. Minimally enlarged precarinal lymph node is noted which is not significantly changed compared to prior exam and therefore most likely reactive in etiology. Atherosclerotic calcifications of thoracic aorta are noted. No definite aneurysm is noted. Left-sided PICC line is noted with distal tip at the cavoatrial junction. Old left rib fractures are noted.  Abnormal low density is noted in portions of the right hepatic lobe which may represent fatty infiltration, but CT scan of the liver with contrast administration is recommended for further evaluation. The remaining portion of the visualized upper abdomen appears normal.  IMPRESSION: Moderate left pleural effusion is noted with associated atelectasis of the left lower lobe.  Multiple focal airspace opacities are noted throughout both lungs most  consistent with multifocal pneumonia. This is most prominently seen in the right lower lobe. Also noted is perihilar airspace opacity in the left upper lobe with associated air bronchograms also consistent with pneumonia. Followup CT scan in 2-3 weeks following antibiotic treatment for pneumonia is recommended to ensure resolution and rule out underlying neoplasm.  Abnormal segmental low density is noted in the right hepatic lobe which may simply represent fatty infiltration, but further evaluation with CT scan with contrast is recommended to rule out neoplasm or other pathology.   Electronically Signed   By: Roque Lias M.D.   On: 08/29/2014 18:06   Mr Brain Wo Contrast  08/28/2014   CLINICAL DATA:  Breast cancer. Increased lethargy. Difficulty holding still. Weakness of the left arm.  EXAM: MRI HEAD WITHOUT CONTRAST  TECHNIQUE: Multiplanar, multiecho pulse sequences of the brain and surrounding structures were obtained without intravenous contrast.  COMPARISON:  CT head without contrast to 08/26/2014.  FINDINGS: The study is mildly degraded by patient motion. No acute cortical infarct, hemorrhage, or mass lesion is present. Mild generalized atrophy is within normal limits for age. No significant white matter disease is evident. Flow is present in the major intracranial arteries.  No significant extraaxial fluid collection is present. The patient is status post bilateral lens replacements. The paranasal sinuses and mastoid air cells are clear.  IMPRESSION: 1. Normal MRI appearance of the brain for age.   Electronically Signed   By: Gennette Pac M.D.   On: 08/28/2014 12:39   Dg Chest Port 1 View  08/29/2014   CLINICAL DATA:  Pneumonia  EXAM: PORTABLE CHEST - 1 VIEW  COMPARISON:  Portable chest x-ray of August 27, 2014 at 10:11 a.m.  FINDINGS: There has been progressive opacification of the inferior 1/2 of the left hemi thorax with opacification of the left heart border and the left hemidiaphragm. Patchy  interstitial densities in the mid and lower right lung are more conspicuous today. The pulmonary vascularity is not engorged. The left sided PICC line tip projects over the midportion of the SVC. There is no pneumothorax. Pleural fluid is likely present on the left. There is an un healed fracture of the posterior aspect of the left eighth rib. There are surgical clips in the right axillary region.  IMPRESSION: Worsening of left lower lobe atelectasis or pneumonia over the past 2 days. Patchy increased density has developed in the right lung as well.   Electronically Signed   By: David  [place]   On: 08/29/2014 07:47     ASSESSMENT / PLAN: RESOLVED ISSUES Resolved septic shock Resolved AKI  ACTIVE ISSUES Acute Hypoxemic Respioratory Failure in setting of LLL PNA (NOS) E coli UTI  Possible stress NSTEMI Hypokalemia Anemia of chronic and critical illlness Acute Encephalopathy: improved Dm-2  Discussion  Persistent dense LLL changes c/w PNA and ATX w/ small effusion. She is turing the corner Plan:   o2 for pulse ox > 88% cont flutter valve/IS and mobilize Bipap PRN Careful w/ narcs Cont current abx;   primaxin 12/13>>>  levaquin 12/12>>>  vanc 12/13>>>12/16 Other issues re: encephalopathy, electrolyte derangements, and NSTEMI per primary and cardiology services   PCCM will s/o   Simonne Martinet ACNP-BC [facility] Pulmonary/Critical Care Pager # [phone] OR # [phone] if no answer    08/30/2014 9:03 AM

## 2014-08-31 LAB — BASIC METABOLIC PANEL
Anion gap: 11 (ref 5–15)
BUN: 6 mg/dL (ref 6–23)
CALCIUM: 8.1 mg/dL — AB (ref 8.4–10.5)
CO2: 30 mEq/L (ref 19–32)
Chloride: 90 mEq/L — ABNORMAL LOW (ref 96–112)
Creatinine, Ser: 0.41 mg/dL — ABNORMAL LOW (ref 0.50–1.10)
GFR calc Af Amer: >90 mL/min (ref >90)
GFR calc non Af Amer: >90 mL/min (ref >90)
GLUCOSE: 97 mg/dL (ref 70–99)
Potassium: 3.3 mEq/L — ABNORMAL LOW (ref 3.7–5.3)
Sodium: 131 mEq/L — ABNORMAL LOW (ref 137–147)

## 2014-08-31 LAB — GLUCOSE, CAPILLARY
GLUCOSE-CAPILLARY: 109 mg/dL — AB (ref 70–99)
GLUCOSE-CAPILLARY: 91 mg/dL (ref 70–99)
GLUCOSE-CAPILLARY: 116 mg/dL — AB (ref 70–99)
Glucose-Capillary: 104 mg/dL — ABNORMAL HIGH (ref 70–99)
Glucose-Capillary: 140 mg/dL — ABNORMAL HIGH (ref 70–99)

## 2014-08-31 LAB — MAGNESIUM: Magnesium: 1.7 mg/dL (ref 1.5–2.5)

## 2014-08-31 LAB — PHOSPHORUS: Phosphorus: 3 mg/dL (ref 2.3–4.6)

## 2014-08-31 MED ORDER — POTASSIUM CHLORIDE CRYS ER 20 MEQ PO TBCR
40.0000 meq | EXTENDED_RELEASE_TABLET | Freq: Four times a day (QID) | ORAL | Status: DC
  Administered 2014-08-31 – 2014-09-02 (×8): 40 meq via ORAL
  Filled 2014-08-31 (×9): qty 2

## 2014-08-31 MED ORDER — INSULIN ASPART 100 UNIT/ML ~~LOC~~ SOLN
0.0000 [IU] | Freq: Three times a day (TID) | SUBCUTANEOUS | Status: DC
  Administered 2014-08-31 – 2014-09-01 (×3): 2 [IU] via SUBCUTANEOUS

## 2014-08-31 MED ORDER — MAGNESIUM OXIDE 400 (241.3 MG) MG PO TABS
400.0000 mg | ORAL_TABLET | Freq: Every day | ORAL | Status: DC
  Administered 2014-08-31 – 2014-09-02 (×3): 400 mg via ORAL
  Filled 2014-08-31 (×3): qty 1

## 2014-08-31 MED ORDER — FUROSEMIDE 40 MG PO TABS
40.0000 mg | ORAL_TABLET | Freq: Two times a day (BID) | ORAL | Status: DC
  Administered 2014-08-31 – 2014-09-02 (×5): 40 mg via ORAL
  Filled 2014-08-31 (×5): qty 1

## 2014-08-31 NOTE — Progress Notes (Signed)
    Subjective:  No chest pain. Breathing continues to improve.   Objective:  Vital Signs in the last 24 hours: Temp:  [98.1 F (36.7 C)-98.4 F (36.9 C)] 98.3 F (36.8 C) (12/17 0421) Pulse Rate:  [69-83] 79 (12/16 2001) Resp:  [14-19] 19 (12/17 0600) BP: (100-121)/(50-97) 121/64 mmHg (12/17 0600) SpO2:  [96 %-100 %] 99 % (12/17 0757)  Intake/Output from previous day: 12/16 0701 - 12/17 0700 In: 1410 [P.O.:300; IV Piggyback:900] Out: 1945 [Urine:1945]  Physical Exam: Pt is alert and oriented, chronically ill appearing in NAD HEENT: normal Neck: JVP - normal Lungs: diminished on the left CV: RRR without murmur or gallop Abd: soft, NT, Positive BS, no hepatomegaly Ext: trace edema Skin: warm/dry no rash   Lab Results:  Recent Labs  08/29/14 0510 08/30/14 0400  WBC 12.0* 9.3  HGB 9.5* 8.6*  PLT 257 205    Recent Labs  08/30/14 0400 08/30/14 1345 08/31/14 0430  NA 133*  --  131*  K 2.3* 2.8* 3.3*  CL 88*  --  90*  CO2 36*  --  30  GLUCOSE 89  --  97  BUN 5*  --  6  CREATININE 0.42*  --  0.41*   No results for input(s): TROPONINI in the last 72 hours.  Invalid input(s): CK, MB  Tele: Sinus rhythm  Assessment/Plan:  1. Acute hypoxemic respiratory failure, likely secondary to pneumonia and diastoilc CHF - improving 2. Shock, septic - resolved 3. Coronary artery disease status post multiple PCI procedures with elevated troponin in the setting of acute medical illness, likely demand ischemia 4. Paroxysmal atrial fibrillation, now maintaining sinus rhythm. 5. Acute diastolic heart failure, improved 6. Hypokalemia 7. Hypomagnesemia  Discussed plan with patient and Dr Dyann Kief. She is going to transfer out of ICU today. Agree that it is reasonable to start her back on oral furosemide today. Appears stable from cardiac perspective. Will sign off please call if questions. thx  Sherren Mocha, M.D. 08/31/2014, 8:17 AM

## 2014-08-31 NOTE — Progress Notes (Addendum)
Pt stated she is missing a gray henley shirt and a prosthetic bra worth $500. ED CN called and made aware to search for any belongings left behind of the above items. [employer] and bedroom slippers in belonging bag at bedside.Husband called pt, could not locate the above items at home. Also called security to check for items and informed they do not accept soiled linens/clothes if any left behind.

## 2014-08-31 NOTE — Evaluation (Signed)
Physical Therapy Evaluation Patient Details Name: KARISA HACKBARTH MRN: [id number] DOB: 10/23/1945 Today's Date: 08/31/2014   History of Present Illness  68 year old female with history of coronary artery disease, diabetes mellitus type 2, COPD, hyperlipidemia, tobacco use admitted on 08/26/2014 with left approximate weakness and dysesthesias. Pt admitted for sepsis likely due to pneumonia and UTI and acute respiratory failure.    Clinical Impression  Pt admitted with above diagnosis. Pt currently with functional limitations due to the deficits listed below (see PT Problem List).  Pt only felt able to tolerate short distance ambulation today however anticipate pt to progress to d/c home with spouse.  Pt reports hx of THR with a couple dislocations thereafter as well as back surgery. Pt will benefit from skilled PT to increase their independence and safety with mobility to allow discharge to the venue listed below.       Follow Up Recommendations Home health PT;Supervision for mobility/OOB    Equipment Recommendations  None recommended by PT    Recommendations for Other Services       Precautions / Restrictions Precautions Precautions: Fall Restrictions Weight Bearing Restrictions: No      Mobility  Bed Mobility Overal bed mobility: Needs Assistance Bed Mobility: Supine to Sit;Sit to Supine     Supine to sit: Supervision Sit to supine: Supervision   General bed mobility comments: supervision for lines  Transfers Overall transfer level: Needs assistance Equipment used: Rolling walker (2 wheeled) Transfers: Sit to/from Stand Sit to Stand: Min assist         General transfer comment: assist to rise and steady  Ambulation/Gait Ambulation/Gait assistance: Min assist Ambulation Distance (Feet): 20 Feet Assistive device: Rolling walker (2 wheeled) Gait Pattern/deviations: Step-through pattern;Trunk flexed;Decreased stride length     General Gait Details: assist due to  weakness, pt reports fatigue and back pain limiting distance at this time  Stairs            Wheelchair Mobility    Modified Rankin (Stroke Patients Only)       Balance                                             Pertinent Vitals/Pain Pain Assessment: 0-10 Pain Score: 7  Pain Location: back Pain Descriptors / Indicators: Sore Pain Intervention(s): Repositioned;Monitored during session;Limited activity within patient's tolerance (has kpad in bed)    Home Living Family/patient expects to be discharged to:: Private residence Living Arrangements: Spouse/significant other   Type of Home: House       Home Layout: One level Home Equipment: Environmental consultant - 2 wheels;Cane - single point      Prior Function Level of Independence: Independent               Hand Dominance        Extremity/Trunk Assessment   Upper Extremity Assessment: LUE deficits/detail       LUE Deficits / Details: observed functional L UE weakness   Lower Extremity Assessment: Generalized weakness         Communication   Communication: No difficulties  Cognition Arousal/Alertness: Awake/alert Behavior During Therapy: WFL for tasks assessed/performed Overall Cognitive Status: Within Functional Limits for tasks assessed                      General Comments      Exercises  Assessment/Plan    PT Assessment    PT Diagnosis Difficulty walking;Generalized weakness   PT Problem List    PT Treatment Interventions     PT Goals (Current goals can be found in the Care Plan section) Acute Rehab PT Goals PT Goal Formulation: With patient Time For Goal Achievement: 09/14/14 Potential to Achieve Goals: Good    Frequency     Barriers to discharge        Co-evaluation               End of Session Equipment Utilized During Treatment: Gait belt Activity Tolerance: Patient limited by fatigue;Patient limited by pain Patient left: in bed;with call  bell/phone within reach           Time: 0920-0935 PT Time Calculation (min) (ACUTE ONLY): 15 min   Charges:   PT Evaluation $Initial PT Evaluation Tier I: 1 Procedure PT Treatments $Gait Training: 8-22 mins   PT G Codes:          Mieczyslaw Stamas,KATHrine E 08/31/2014, 11:36 AM Zenovia Jarred, PT, DPT 08/31/2014 Pager: [phone]

## 2014-08-31 NOTE — Progress Notes (Signed)
PROGRESS NOTE  [name] ZOX:[id number] DOB: September 13, 1946 DOA: 08/26/2014 PCP: Redmond Baseman, MD  Brief history 68 year old female with history of coronary artery disease, diabetes mellitus type 2, COPD, hyperlipidemia, tobacco use presented to the emergency department on 08/26/2014 because of left approximate weakness and dysesthesias. The patient's husband states that the patient has been confused and having hallucinations for 2 weeks prior to admission with associated generalized weakness and worsening dyspnea on exertion for the period of time. He stated that the patient was very resistant to see a physician or go to the hospital, until she developed left upper extremity weakness. The patient was found to be hypotensive with acute kidney injury. In addition, she was hyponatremic with a sodium of 121 and hypokalemic with potassium of less than 2.2. The patient was admitted to the ICU where the patient continued to be hypotensive. The patient was started on Neo-Synephrine after she received over 4 L of normal saline. The patient subsequently developed hypoxemia and was placed on a nonrebreather. Critical care medicine due to the patient's acute medical illness and need for pressor support. Cardiology was consulted due to the patient's history of ischemic heart disease and paroxysmal mitral fibrillation.  Assessment/Plan: Sepsis -Likely due to pneumonia and UTI -The patient received a dose of levofloxacin on 08/26/2014 @2340  -Unfortunately, blood cultures were not previously obtained -08/27/2014 Blood cultures 2 sets--negative so far -urine culture-->positive for E. Coli -appreciate CCM inputs and recommendations  -Procalcitonin 0.57-->0.28--><0.10 -Patient is BP remains stable despite no pressors -will continue narrow antibiotics to levaquin now base on culture data  -will transfer to telemetry bed  Acute respiratory failure -due to pneumonia and acute on chronic  CHF -significantly improved with diuresis -continue pulmonary hygiene and oxygen supplementation -mild effusion on CXR but not enough for thoracentesis to be done  Pneumonia -IV abx as discussed, narrow coverage pending culture data -continue pulm hygiene -continue bronchodilators  Acute on chronic Diastolic CHF -neg >4.6L in past 36 hours -continue daily weights; strict I's and O's and low sodium diet -will continue diuresis as recommended by cardiology -replete electrolytes as needed  Atrial fibrillation with RVR -Likely due to the patient's sepsis -now back in sinus -07/27/2014 Lexiscan was normal with normal EF -07/27/2014 echocardiogram EF 65-70 percent -Appreciate cardiology followup -will follow results of repeated echocardiogram  Elevated Troponin -likely demand ischemia -cardiology following; will follow rec's  AKI -due to UTI, hypotension associated with sepsis and hemodynamic changes -improved with IVF and stable with diuretics -will monitor electrolyes  Alcohol Dependence - patient drank "half gallon" vodka every week x 1-85yrs -past few weeks--"4 cups" vodka daily -continue CIWA protocol  Hyponatremia -due to volume depletion and diuretics initially -will monitor  Hypokalemia -will continue repletion as needed  Diabetes mellitus type II -continue NovoLog sliding scale -08/26/2014 hemoglobin A1c 5.5  Left arm weakness -MRI brain--negative for acute findings -continue ASA  Chronic pain with chronic opioid dependence -Continue MS Contin q8h TID and oxyIR for breakthrough -fentanyl discontinued; will maximize oral agents for pain control  Leukocytosis -WBC's down to normal range now -no fever -will continue monitoring  Family Communication: Husband updated at beside Disposition Plan: Home when medically stable   Procedures/Studies: Ct Head Wo Contrast  08/26/2014   CLINICAL DATA:  Progressive left upper extremity weakness over the past  1 week.  EXAM: CT HEAD WITHOUT CONTRAST  TECHNIQUE: Contiguous axial images were obtained from the base of the skull through the  vertex without intravenous contrast.  COMPARISON:  None.  FINDINGS: Mild age related cortical atrophy. Ventricular system normal in size and appearance for age. No mass lesion. No midline shift. No acute hemorrhage or hematoma. No extra-axial fluid collections. No evidence of acute infarction.  No skull fracture or other focal osseous abnormality involving the skull. Visualized paranasal sinuses, bilateral mastoid air cells and bilateral middle ear cavities well-aerated. Mild bilateral carotid siphon atherosclerosis.  IMPRESSION: 1. No acute intracranial abnormality. 2. Mild age related cortical atrophy.   Electronically Signed   By: Hulan Saas M.D.   On: 08/26/2014 17:04   Ct Chest Wo Contrast  08/29/2014   CLINICAL DATA:  Persistent cough, shortness of breath.  EXAM: CT CHEST WITHOUT CONTRAST  TECHNIQUE: Multidetector CT imaging of the chest was performed following the standard protocol without IV contrast.  COMPARISON:  Radiographs of same date ; CT scan of August 06, 2011.  FINDINGS: No pneumothorax is noted. Moderate left pleural effusion is noted with associated atelectasis of the left lower lobe. Multiple focal airspace opacities are noted throughout both lungs most consistent with multifocal pneumonia. This is most prominently seen posteriorly in the right lower lobe. Left perihilar airspace opacity is noted in the left upper lobe with associated air bronchograms most consistent with perihilar pneumonia. Coronary artery calcifications are noted. Minimal pericardial effusion is noted. Minimally enlarged precarinal lymph node is noted which is not significantly changed compared to prior exam and therefore most likely reactive in etiology. Atherosclerotic calcifications of thoracic aorta are noted. No definite aneurysm is noted. Left-sided PICC line is noted with distal  tip at the cavoatrial junction. Old left rib fractures are noted.  Abnormal low density is noted in portions of the right hepatic lobe which may represent fatty infiltration, but CT scan of the liver with contrast administration is recommended for further evaluation. The remaining portion of the visualized upper abdomen appears normal.  IMPRESSION: Moderate left pleural effusion is noted with associated atelectasis of the left lower lobe.  Multiple focal airspace opacities are noted throughout both lungs most consistent with multifocal pneumonia. This is most prominently seen in the right lower lobe. Also noted is perihilar airspace opacity in the left upper lobe with associated air bronchograms also consistent with pneumonia. Followup CT scan in 2-3 weeks following antibiotic treatment for pneumonia is recommended to ensure resolution and rule out underlying neoplasm.  Abnormal segmental low density is noted in the right hepatic lobe which may simply represent fatty infiltration, but further evaluation with CT scan with contrast is recommended to rule out neoplasm or other pathology.   Electronically Signed   By: Roque Lias M.D.   On: 08/29/2014 18:06   Mr Brain Wo Contrast  08/28/2014   CLINICAL DATA:  Breast cancer. Increased lethargy. Difficulty holding still. Weakness of the left arm.  EXAM: MRI HEAD WITHOUT CONTRAST  TECHNIQUE: Multiplanar, multiecho pulse sequences of the brain and surrounding structures were obtained without intravenous contrast.  COMPARISON:  CT head without contrast to 08/26/2014.  FINDINGS: The study is mildly degraded by patient motion. No acute cortical infarct, hemorrhage, or mass lesion is present. Mild generalized atrophy is within normal limits for age. No significant white matter disease is evident. Flow is present in the major intracranial arteries.  No significant extraaxial fluid collection is present. The patient is status post bilateral lens replacements. The paranasal  sinuses and mastoid air cells are clear.  IMPRESSION: 1. Normal MRI appearance of the brain for age.  Electronically Signed   By: Gennette Pac M.D.   On: 08/28/2014 12:39   Dg Chest Port 1 View  08/29/2014   CLINICAL DATA:  Pneumonia  EXAM: PORTABLE CHEST - 1 VIEW  COMPARISON:  Portable chest x-ray of August 27, 2014 at 10:11 a.m.  FINDINGS: There has been progressive opacification of the inferior 1/2 of the left hemi thorax with opacification of the left heart border and the left hemidiaphragm. Patchy interstitial densities in the mid and lower right lung are more conspicuous today. The pulmonary vascularity is not engorged. The left sided PICC line tip projects over the midportion of the SVC. There is no pneumothorax. Pleural fluid is likely present on the left. There is an un healed fracture of the posterior aspect of the left eighth rib. There are surgical clips in the right axillary region.  IMPRESSION: Worsening of left lower lobe atelectasis or pneumonia over the past 2 days. Patchy increased density has developed in the right lung as well.   Electronically Signed   By: David  [place]   On: 08/29/2014 07:47   Dg Chest Port 1 View  08/27/2014   CLINICAL DATA:  New left-sided PICC line.  EXAM: PORTABLE CHEST - 1 VIEW  COMPARISON:  08/27/2014  FINDINGS: Left-sided PICC line has been placed, tip overlying the level of the superior vena cava. There is increasing infiltrate in the left lung base. No evidence for pneumothorax. Persistent interstitial edema. Again noted is left posterior ninth rib fracture. No pneumothorax.  IMPRESSION: 1. Interval placement of left-sided PICC line. 2. Developing left lower lobe infiltrate.   Electronically Signed   By: Rosalie Gums M.D.   On: 08/27/2014 10:43   Dg Chest Port 1 View  08/27/2014   CLINICAL DATA:  68 year old female with respiratory failure  EXAM: PORTABLE CHEST - 1 VIEW  COMPARISON:  Prior chest x-ray 06/23/2014  FINDINGS: Patchy airspace infiltrate in  the left mid lung and left retrocardiac region concerning for multifocal pneumonia. Stable mild cardiomegaly. Atherosclerotic calcification present in the transverse aorta. Difficult to exclude a small left pleural effusion. No pneumothorax. Background of mild pulmonary vascular congestion bordering on edema. Surgical clips noted in the right axilla. No acute osseous abnormality.  IMPRESSION: 1. Patchy interstitial and airspace opacities in the left mid and lower lung concerning for pneumonia. 2. Background of superimposed pulmonary vascular congestion bordering on mild interstitial edema. 3. Stable cardiomegaly. 4. Aortic atherosclerosis.   Electronically Signed   By: Malachy Moan M.D.   On: 08/27/2014 08:52     Subjective: Patient is afebrile, no CP, no palpitations. Endorses breathing is much better.  Objective: Filed Vitals:   08/31/14 0421 08/31/14 0600 08/31/14 0757 08/31/14 0800  BP:  121/64  129/67  Pulse:      Temp: 98.3 F (36.8 C)   97.4 F (36.3 C)  TempSrc: Oral   Oral  Resp:  19  23  Height:      Weight:      SpO2:  100% 99% 100%    Intake/Output Summary (Last 24 hours) at 08/31/14 1022 Last data filed at 08/31/14 0700  Gross per 24 hour  Intake   1040 ml  Output   1845 ml  Net   -805 ml   Weight change:  Exam:   General:  Pt is alert, follows commands appropriately, not in acute distress; breathing a lot easier. No fever  HEENT: No icterus, No thrush,  [place]/AT  Cardiovascular: RRR, S1/S2, no rubs, no gallops  Respiratory:  Bilateral rales, right greater than left. No wheezing.  Abdomen: Soft/+BS, non tender, non distended, no guarding  Extremities: trace to 1+LE edema, No lymphangitis, No petechiae, No rashes, no synovitis  Data Reviewed: Basic Metabolic Panel:  Recent Labs Lab 08/27/14 1011 08/27/14 1200 08/27/14 1739 08/28/14 0630 08/29/14 0510 08/30/14 0400 08/30/14 1345 08/31/14 0430  NA 130*  --  128* 131* 133* 133*  --  131*  K 2.6*   --  3.0* 3.5* 3.0* 2.3* 2.8* 3.3*  CL 82*  --  81* 87* 86* 88*  --  90*  CO2 39*  --  35* 36* 36* 36*  --  30  GLUCOSE 147*  --  220* 162* 84 89  --  97  BUN 15  --  11 8 5* 5*  --  6  CREATININE 0.74  --  0.60 0.54 0.44* 0.42*  --  0.41*  CALCIUM 7.7*  --  8.0* 8.5 8.8 8.1*  --  8.1*  MG 1.8  --   --  1.9 1.4* 1.5  --  1.7  PHOS  --  3.4  --  1.9* 2.4 3.3  --  3.0   Liver Function Tests:  Recent Labs Lab 08/26/14 1625 08/27/14 0134  AST 90* 75*  ALT 28 23  ALKPHOS 368* 297*  BILITOT 0.7 0.5  PROT 7.0 5.8*  ALBUMIN 2.5* 1.9*   CBC:  Recent Labs Lab 08/26/14 1625 08/27/14 0134 08/28/14 0530 08/29/14 0510 08/30/14 0400  WBC 14.1* 9.8 12.7* 12.0* 9.3  NEUTROABS 11.5* 7.4 11.3* 9.7* 7.1  HGB 11.1* 9.7* 9.8* 9.5* 8.6*  HCT 31.1* 27.5* 28.0* 28.0* 24.3*  MCV 101.3* 100.7* 102.9* 103.7* 102.5*  PLT 265 212 267 257 205   Cardiac Enzymes:  Recent Labs Lab 08/27/14 0445 08/27/14 1011 08/27/14 1611  TROPONINI <0.30 0.53* 1.20*   BNP: Invalid input(s): POCBNP CBG:  Recent Labs Lab 08/30/14 1221 08/30/14 1647 08/30/14 2014 08/30/14 2315 08/31/14 0419  GLUCAP 99 105* 109* 109* 91    Recent Results (from the past 240 hour(s))  MRSA PCR Screening     Status: None   Collection Time: 08/26/14  9:40 PM  Result Value Ref Range Status   MRSA by PCR NEGATIVE NEGATIVE Final    Comment:        The GeneXpert MRSA Assay (FDA approved for NASAL specimens only), is one component of a comprehensive MRSA colonization surveillance program. It is not intended to diagnose MRSA infection nor to guide or monitor treatment for MRSA infections.   Culture, Urine     Status: None   Collection Time: 08/26/14 11:34 PM  Result Value Ref Range Status   Specimen Description URINE, RANDOM  Final   Special Requests NONE  Final   Culture  Setup Time   Final    08/27/2014 11:19 Performed at [facility] Count   Final    >=100,000 COLONIES/ML Performed at  [facility]    Culture   Final    ESCHERICHIA COLI Performed at [facility]    Report Status 08/29/2014 FINAL  Final   Organism ID, Bacteria ESCHERICHIA COLI  Final      Susceptibility   Escherichia coli - MIC*    AMPICILLIN 8 SENSITIVE Sensitive     CEFAZOLIN <=4 SENSITIVE Sensitive     CEFTRIAXONE <=1 SENSITIVE Sensitive     CIPROFLOXACIN <=0.25 SENSITIVE Sensitive     GENTAMICIN <=1 SENSITIVE Sensitive  LEVOFLOXACIN <=0.12 SENSITIVE Sensitive     NITROFURANTOIN <=16 SENSITIVE Sensitive     TOBRAMYCIN <=1 SENSITIVE Sensitive     TRIMETH/SULFA <=20 SENSITIVE Sensitive     PIP/TAZO <=4 SENSITIVE Sensitive     * ESCHERICHIA COLI  Culture, blood (routine x 2)     Status: None (Preliminary result)   Collection Time: 08/27/14  8:30 AM  Result Value Ref Range Status   Specimen Description BLOOD LEFT HAND  Final   Special Requests BOTTLES DRAWN AEROBIC ONLY 1 CC  Final   Culture  Setup Time   Final    08/27/2014 19:25 Performed at [facility]    Culture   Final           BLOOD CULTURE RECEIVED NO GROWTH TO DATE CULTURE WILL BE HELD FOR 5 DAYS BEFORE ISSUING A FINAL NEGATIVE REPORT Note: Culture results may be compromised due to an inadequate volume of blood received in culture bottles. Performed at [facility]    Report Status PENDING  Incomplete  Culture, blood (routine x 2)     Status: None (Preliminary result)   Collection Time: 08/27/14  8:37 AM  Result Value Ref Range Status   Specimen Description BLOOD LEFT HAND  Final   Special Requests BOTTLES DRAWN AEROBIC ONLY 4CC  Final   Culture  Setup Time   Final    08/27/2014 19:25 Performed at [facility]    Culture   Final           BLOOD CULTURE RECEIVED NO GROWTH TO DATE CULTURE WILL BE HELD FOR 5 DAYS BEFORE ISSUING A FINAL NEGATIVE REPORT Performed at [facility]    Report Status PENDING  Incomplete     Scheduled Meds: . antiseptic oral rinse  7 mL Mouth Rinse  BID  . aspirin  325 mg Oral Daily  . folic acid  1 mg Oral Daily  . furosemide  40 mg Oral BID  . insulin aspart  0-15 Units Subcutaneous TID AC & HS  . ipratropium-albuterol  3 mL Nebulization TID  . levofloxacin (LEVAQUIN) IV  750 mg Intravenous Q24H  . magnesium oxide  400 mg Oral Daily  . morphine  30 mg Oral 3 times per day  . multivitamin with minerals  1 tablet Oral Daily  . nicotine  21 mg Transdermal Q24H  . pneumococcal 23 valent vaccine  0.5 mL Intramuscular Tomorrow-1000  . potassium chloride  40 mEq Oral QID  . simvastatin  10 mg Oral QPM  . sodium chloride  10-40 mL Intracatheter Q12H  . sodium chloride  3 mL Intravenous Q12H  . thiamine  100 mg Oral Daily   Or  . thiamine  100 mg Intravenous Daily   Continuous Infusions:     Time: >30 minutes   Vassie Loll, MD [facility] Pager [phone]  If 7PM-7AM, please contact night-coverage [email or web address] Password [email or web address] 08/31/2014, 10:22 AM   LOS: 5 days

## 2014-09-01 LAB — BASIC METABOLIC PANEL
ANION GAP: 12 (ref 5–15)
BUN: 6 mg/dL (ref 6–23)
CHLORIDE: 94 meq/L — AB (ref 96–112)
CO2: 27 meq/L (ref 19–32)
Calcium: 7.9 mg/dL — ABNORMAL LOW (ref 8.4–10.5)
Creatinine, Ser: 0.44 mg/dL — ABNORMAL LOW (ref 0.50–1.10)
GFR calc non Af Amer: >90 mL/min (ref >90)
Glucose, Bld: 105 mg/dL — ABNORMAL HIGH (ref 70–99)
POTASSIUM: 3.4 meq/L — AB (ref 3.7–5.3)
Sodium: 133 mEq/L — ABNORMAL LOW (ref 137–147)

## 2014-09-01 LAB — GLUCOSE, CAPILLARY
GLUCOSE-CAPILLARY: 126 mg/dL — AB (ref 70–99)
Glucose-Capillary: 120 mg/dL — ABNORMAL HIGH (ref 70–99)
Glucose-Capillary: 93 mg/dL (ref 70–99)
Glucose-Capillary: 105 mg/dL — ABNORMAL HIGH (ref 70–99)

## 2014-09-01 LAB — MAGNESIUM: MAGNESIUM: 1.5 mg/dL (ref 1.5–2.5)

## 2014-09-01 MED ORDER — LEVOFLOXACIN 750 MG PO TABS
750.0000 mg | ORAL_TABLET | Freq: Every day | ORAL | Status: DC
  Administered 2014-09-01: 750 mg via ORAL
  Filled 2014-09-01 (×2): qty 1

## 2014-09-01 MED ORDER — GABAPENTIN 100 MG PO CAPS
100.0000 mg | ORAL_CAPSULE | Freq: Three times a day (TID) | ORAL | Status: DC
  Administered 2014-09-01 – 2014-09-02 (×2): 100 mg via ORAL
  Filled 2014-09-01 (×4): qty 1

## 2014-09-01 MED ORDER — IPRATROPIUM-ALBUTEROL 0.5-2.5 (3) MG/3ML IN SOLN: 3.0000 mL | Freq: Four times a day (QID) | RESPIRATORY_TRACT | Status: DC | PRN

## 2014-09-01 MED ORDER — IPRATROPIUM BROMIDE 0.02 % IN SOLN: 0.5000 mg | Freq: Every day | RESPIRATORY_TRACT | Status: DC

## 2014-09-01 NOTE — Progress Notes (Signed)
ANTIBIOTIC CONSULT NOTE - FOLLOW-UP  Pharmacy Consult for Levofloxacin, Vancomycin Indication: Sepsis, Pneumonia, UTI  Allergies  Allergen Reactions  . Penicillins   . Zithromax [Azithromycin Dihydrate]     ORAL ULCERS     Patient Measurements: Height: 5\' 3"  (160 cm) Weight: 146 lb 6.2 oz (66.4 kg) IBW/kg (Calculated) : 52.4   Vital Signs: Temp: 97.7 F (36.5 C) (12/18 0800) Temp Source: Oral (12/18 0800) BP: 105/68 mmHg (12/18 0600) Intake/Output from previous day: 12/17 0701 - 12/18 0700 In: 970 [P.O.:550; IV Piggyback:150] Out: 8127 [Urine:1815] Intake/Output from this shift:    Labs:  Recent Labs  08/30/14 0400 08/31/14 0430 09/01/14 0409  WBC 9.3  --   --   HGB 8.6*  --   --   PLT 205  --   --   CREATININE 0.42* 0.41* 0.44*   Estimated Creatinine Clearance: 63.3 mL/min (by C-G formula based on Cr of 0.44).  Recent Labs  08/29/14 0930  VANCOTROUGH 14.9      Medical History: Past Medical History  Diagnosis Date  . Myocardial infarction 2000  . Diabetes mellitus type 2, insulin dependent   . COPD (chronic obstructive pulmonary disease)   . Hyperlipidemia   . Breast cancer   . OA (osteoarthritis) of knee   . Hypertension   . Coronary artery disease   . Asthma   . Elevated LFTs   . ETOH abuse   . Tobacco abuse   . Osteoarthritis    Microbiology: 12/12 urine: >=100,000 colonies/mL E.coli, pansensitive 12/12 MRSA PCR: negative 12/13 blood x 2: NGTD    Anti-infectives:  12/12 >> Levofloxacin >> 12/13 >> Primaxin>> 12/16 12/13 >> Vancomycin >> 12/16  Assessment: 68yo F admitted w/ L arm weakness, numbness and tingling in fingers. Found to have severe hypokalemia, hyponatremia and ARF. Started on Levaquin for possible UTI per pharmacy protocol. Hypotension requiring pressors. Antibiotics broadened for suspected sepsis and pneumonia, with Vancomycin per pharmacy and Primaxin per MD, subsequently de-escalated to Levaquin monotherapy after  urine culture grew E.Coli, pan-susceptible.  Today, 12/18: D#7 Levaquin 750 mg IV q24h Afebrile WBC improved to WNL on 12/16 SCr low, stable. Est CrCl > 50 mL/min.  Goal of Therapy:  Appropriate antibiotic dosing for indication and renal function; eradication of infection.   Plan:  1. Continue present Levaquin dosage. 2. Await word on planned duration of therapy. 3. Consider switch to PO route.  Clayburn Pert, PharmD, BCPS Pager: [phone] 09/01/2014  8:57 AM

## 2014-09-01 NOTE — Progress Notes (Addendum)
PROGRESS NOTE  [name] BJY:[id number] DOB: [birth year] DOA: 08/26/2014 PCP: Redmond Baseman, MD  Brief history 68 year old female with history of coronary artery disease, diabetes mellitus type 2, COPD, hyperlipidemia, tobacco use presented to the emergency department on 08/26/2014 because of left approximate weakness and dysesthesias. The patient's husband states that the patient has been confused and having hallucinations for 2 weeks prior to admission with associated generalized weakness and worsening dyspnea on exertion for the period of time. He stated that the patient was very resistant to see a physician or go to the hospital, until she developed left upper extremity weakness. The patient was found to be hypotensive with acute kidney injury. In addition, she was hyponatremic with a sodium of 121 and hypokalemic with potassium of less than 2.2. The patient was admitted to the ICU where the patient continued to be hypotensive. The patient was started on Neo-Synephrine after she received over 4 L of normal saline. The patient subsequently developed hypoxemia and was placed on a nonrebreather. Critical care medicine due to the patient's acute medical illness and need for pressor support. Cardiology was consulted due to the patient's history of ischemic heart disease and paroxysmal mitral fibrillation with elevated troponin. Significant improvement with diuresis, abx's and pulmonary hygiene. Will transfer to floor and transition treatment to PO. If remains stable will d/c home with [facility] services.  Assessment/Plan: Sepsis -Likely due to pneumonia and UTI -08/27/2014 Blood cultures 2 sets--negative so far -urine culture-->positive for E. Coli -appreciate CCM inputs and recommendations  -no fever and WBC's WNL -Patient is BP remains stable despite no pressors -will continue narrowed antibiotic therapy and complete 10 days total (on levaquin; day 6/10 -will transfer to telemetry  bed, transition abx's and rest of treatment to PO if well tolerated will d/c home tomorrow (12/19)  Acute respiratory failure -due to pneumonia and acute on chronic CHF -significantly improved in her breathing with diuresis -continue pulmonary hygiene and oxygen supplementation -mild effusion on CXR but not enough for thoracentesis to be done -now off oxygen supplementation  Pneumonia -continue pulm hygiene -continue bronchodilators -will complete a total of 10 days of abx's with levaquin  Acute on chronic Diastolic CHF -good urine output with oral agents -continue daily weights; strict I's and O's and low sodium diet -will continue diuresis as recommended by cardiology; lasix 40mg  po BID -replete electrolytes as needed  Atrial fibrillation with RVR -Likely due to patient's sepsis -now back in sinus -07/27/2014 Lexiscan was normal with normal EF -07/27/2014 echocardiogram EF 65-70 percent -Appreciate cardiology assistance and recommendations -repeated 2-D echo: with preserved EF; poor apical views, mild LVH and moderate MR  Elevated Troponin -likely demand ischemia -cardiology has now sign off and will follow as an outpatient (plan is for follow up 2 weeks after discharge)  AKI -due to UTI, hypotension associated with sepsis and hemodynamic changes -improved with IVF and stable with ongoing diuretic therapy -will monitor electrolytes -BMET in am  Alcohol Dependence - patient drank "half gallon" vodka every week x 1-56yrs -past few weeks--"4 cups" vodka daily -CIWA to be discontinued today -no withdrawal appreciated -continue thiamine and folic acid  Hyponatremia -due to volume depletion and diuretics initially -will monitor  Hypokalemia/hypomagnesemia -will continue repletion as needed -will benefit of daily supplementation at discharge -potassium and Magox  Diabetes mellitus type II -continue NovoLog sliding scale while inpatient -hemoglobin A1c 5.5 during this  admission -low carbohydrates diet at discharge to control diabetes  Left arm weakness/TIA -MRI brain--negative for acute findings -continue ASA for secondary prevention of potential TIA  Chronic pain with chronic opioid dependence -Continue MS Contin q8h TID and oxyIR for breakthrough -will maximize oral agents for pain control -will start neurontin 100mg  TID -slowly resume home analgesic regimen (the less the better)  Leukocytosis -WBC's down to normal range now -no fever -will continue monitoring  Protein calorie malnutrition (moderate) -will continue feeding supplements -patient encourage to increase PO intake  Physical deconditioning/weakness: -Will arrange for [facility] services at discharge -patient refusing any inpatient rehabilitation service.  Family Communication: no family at bedside during this visit Disposition Plan: Home when medically stable (most likely on 12/19)   Procedures/Studies: Ct Head Wo Contrast  08/26/2014   CLINICAL DATA:  Progressive left upper extremity weakness over the past 1 week.  EXAM: CT HEAD WITHOUT CONTRAST  TECHNIQUE: Contiguous axial images were obtained from the base of the skull through the vertex without intravenous contrast.  COMPARISON:  None.  FINDINGS: Mild age related cortical atrophy. Ventricular system normal in size and appearance for age. No mass lesion. No midline shift. No acute hemorrhage or hematoma. No extra-axial fluid collections. No evidence of acute infarction.  No skull fracture or other focal osseous abnormality involving the skull. Visualized paranasal sinuses, bilateral mastoid air cells and bilateral middle ear cavities well-aerated. Mild bilateral carotid siphon atherosclerosis.  IMPRESSION: 1. No acute intracranial abnormality. 2. Mild age related cortical atrophy.   Electronically Signed   By: Hulan Saas M.D.   On: 08/26/2014 17:04   Ct Chest Wo Contrast  08/29/2014   CLINICAL DATA:  Persistent cough,  shortness of breath.  EXAM: CT CHEST WITHOUT CONTRAST  TECHNIQUE: Multidetector CT imaging of the chest was performed following the standard protocol without IV contrast.  COMPARISON:  Radiographs of same date ; CT scan of August 06, 2011.  FINDINGS: No pneumothorax is noted. Moderate left pleural effusion is noted with associated atelectasis of the left lower lobe. Multiple focal airspace opacities are noted throughout both lungs most consistent with multifocal pneumonia. This is most prominently seen posteriorly in the right lower lobe. Left perihilar airspace opacity is noted in the left upper lobe with associated air bronchograms most consistent with perihilar pneumonia. Coronary artery calcifications are noted. Minimal pericardial effusion is noted. Minimally enlarged precarinal lymph node is noted which is not significantly changed compared to prior exam and therefore most likely reactive in etiology. Atherosclerotic calcifications of thoracic aorta are noted. No definite aneurysm is noted. Left-sided PICC line is noted with distal tip at the cavoatrial junction. Old left rib fractures are noted.  Abnormal low density is noted in portions of the right hepatic lobe which may represent fatty infiltration, but CT scan of the liver with contrast administration is recommended for further evaluation. The remaining portion of the visualized upper abdomen appears normal.  IMPRESSION: Moderate left pleural effusion is noted with associated atelectasis of the left lower lobe.  Multiple focal airspace opacities are noted throughout both lungs most consistent with multifocal pneumonia. This is most prominently seen in the right lower lobe. Also noted is perihilar airspace opacity in the left upper lobe with associated air bronchograms also consistent with pneumonia. Followup CT scan in 2-3 weeks following antibiotic treatment for pneumonia is recommended to ensure resolution and rule out underlying neoplasm.  Abnormal  segmental low density is noted in the right hepatic lobe which may simply represent fatty infiltration, but further evaluation with CT scan with contrast is  recommended to rule out neoplasm or other pathology.   Electronically Signed   By: Roque Lias M.D.   On: 08/29/2014 18:06   Mr Brain Wo Contrast  08/28/2014   CLINICAL DATA:  Breast cancer. Increased lethargy. Difficulty holding still. Weakness of the left arm.  EXAM: MRI HEAD WITHOUT CONTRAST  TECHNIQUE: Multiplanar, multiecho pulse sequences of the brain and surrounding structures were obtained without intravenous contrast.  COMPARISON:  CT head without contrast to 08/26/2014.  FINDINGS: The study is mildly degraded by patient motion. No acute cortical infarct, hemorrhage, or mass lesion is present. Mild generalized atrophy is within normal limits for age. No significant white matter disease is evident. Flow is present in the major intracranial arteries.  No significant extraaxial fluid collection is present. The patient is status post bilateral lens replacements. The paranasal sinuses and mastoid air cells are clear.  IMPRESSION: 1. Normal MRI appearance of the brain for age.   Electronically Signed   By: Gennette Pac M.D.   On: 08/28/2014 12:39   Dg Chest Port 1 View  08/29/2014   CLINICAL DATA:  Pneumonia  EXAM: PORTABLE CHEST - 1 VIEW  COMPARISON:  Portable chest x-ray of August 27, 2014 at 10:11 a.m.  FINDINGS: There has been progressive opacification of the inferior 1/2 of the left hemi thorax with opacification of the left heart border and the left hemidiaphragm. Patchy interstitial densities in the mid and lower right lung are more conspicuous today. The pulmonary vascularity is not engorged. The left sided PICC line tip projects over the midportion of the SVC. There is no pneumothorax. Pleural fluid is likely present on the left. There is an un healed fracture of the posterior aspect of the left eighth rib. There are surgical clips in  the right axillary region.  IMPRESSION: Worsening of left lower lobe atelectasis or pneumonia over the past 2 days. Patchy increased density has developed in the right lung as well.   Electronically Signed   By: David  [place]   On: 08/29/2014 07:47   Dg Chest Port 1 View  08/27/2014   CLINICAL DATA:  New left-sided PICC line.  EXAM: PORTABLE CHEST - 1 VIEW  COMPARISON:  08/27/2014  FINDINGS: Left-sided PICC line has been placed, tip overlying the level of the superior vena cava. There is increasing infiltrate in the left lung base. No evidence for pneumothorax. Persistent interstitial edema. Again noted is left posterior ninth rib fracture. No pneumothorax.  IMPRESSION: 1. Interval placement of left-sided PICC line. 2. Developing left lower lobe infiltrate.   Electronically Signed   By: Rosalie Gums M.D.   On: 08/27/2014 10:43   Dg Chest Port 1 View  08/27/2014   CLINICAL DATA:  68 year old female with respiratory failure  EXAM: PORTABLE CHEST - 1 VIEW  COMPARISON:  Prior chest x-ray 06/23/2014  FINDINGS: Patchy airspace infiltrate in the left mid lung and left retrocardiac region concerning for multifocal pneumonia. Stable mild cardiomegaly. Atherosclerotic calcification present in the transverse aorta. Difficult to exclude a small left pleural effusion. No pneumothorax. Background of mild pulmonary vascular congestion bordering on edema. Surgical clips noted in the right axilla. No acute osseous abnormality.  IMPRESSION: 1. Patchy interstitial and airspace opacities in the left mid and lower lung concerning for pneumonia. 2. Background of superimposed pulmonary vascular congestion bordering on mild interstitial edema. 3. Stable cardiomegaly. 4. Aortic atherosclerosis.   Electronically Signed   By: Malachy Moan M.D.   On: 08/27/2014 08:52  Subjective: Patient feeling at her best today. Denies CP, SOB, nausea, vomiting and fever/chills. No oxygen supplementation. AAOX3  Objective: Filed  Vitals:   09/01/14 1348 09/01/14 1527 09/01/14 2037 09/01/14 2120  BP: 113/81   101/57  Pulse: 113   97  Temp: 98.2 F (36.8 C)   99 F (37.2 C)  TempSrc: Oral   Oral  Resp: 20   19  Height: 5\' 2"  (1.575 m)     Weight: 69.4 kg (153 lb)     SpO2: 91% 91% 92% 94%    Intake/Output Summary (Last 24 hours) at 09/01/14 2137 Last data filed at 09/01/14 1700  Gross per 24 hour  Intake    540 ml  Output    865 ml  Net   -325 ml   Weight change:  Exam:   General:  Pt is alert, follows commands appropriately, not in acute distress; great improvement in breathing. No requiring oxygen supplementation  HEENT: No icterus, No thrush,  [place]/AT; no JVD  Cardiovascular: RRR, S1/S2, no rubs, no gallops  Respiratory:  Good air movement, no frank crackles; no wheezing. Positive Bilateral rhonchi   Abdomen: Soft/+BS, non tender, non distended, no guarding  Extremities: trace LE edema, No lymphangitis, No petechiae, No rashes, no synovitis  Data Reviewed: Basic Metabolic Panel:  Recent Labs Lab 08/27/14 1200  08/28/14 0630 08/29/14 0510 08/30/14 0400 08/30/14 1345 08/31/14 0430 09/01/14 0409  NA  --   < > 131* 133* 133*  --  131* 133*  K  --   < > 3.5* 3.0* 2.3* 2.8* 3.3* 3.4*  CL  --   < > 87* 86* 88*  --  90* 94*  CO2  --   < > 36* 36* 36*  --  30 27  GLUCOSE  --   < > 162* 84 89  --  97 105*  BUN  --   < > 8 5* 5*  --  6 6  CREATININE  --   < > 0.54 0.44* 0.42*  --  0.41* 0.44*  CALCIUM  --   < > 8.5 8.8 8.1*  --  8.1* 7.9*  MG  --   --  1.9 1.4* 1.5  --  1.7 1.5  PHOS 3.4  --  1.9* 2.4 3.3  --  3.0  --   < > = values in this interval not displayed. Liver Function Tests:  Recent Labs Lab 08/26/14 1625 08/27/14 0134  AST 90* 75*  ALT 28 23  ALKPHOS 368* 297*  BILITOT 0.7 0.5  PROT 7.0 5.8*  ALBUMIN 2.5* 1.9*   CBC:  Recent Labs Lab 08/26/14 1625 08/27/14 0134 08/28/14 0530 08/29/14 0510 08/30/14 0400  WBC 14.1* 9.8 12.7* 12.0* 9.3  NEUTROABS 11.5* 7.4 11.3*  9.7* 7.1  HGB 11.1* 9.7* 9.8* 9.5* 8.6*  HCT 31.1* 27.5* 28.0* 28.0* 24.3*  MCV 101.3* 100.7* 102.9* 103.7* 102.5*  PLT 265 212 267 257 205   Cardiac Enzymes:  Recent Labs Lab 08/27/14 0445 08/27/14 1011 08/27/14 1611  TROPONINI <0.30 0.53* 1.20*   BNP: Invalid input(s): POCBNP CBG:  Recent Labs Lab 08/31/14 1942 09/01/14 0939 09/01/14 1200 09/01/14 1715 09/01/14 2118  GLUCAP 140* 120* 93 105* 126*    Recent Results (from the past 240 hour(s))  MRSA PCR Screening     Status: None   Collection Time: 08/26/14  9:40 PM  Result Value Ref Range Status   MRSA by PCR NEGATIVE NEGATIVE Final    Comment:  The GeneXpert MRSA Assay (FDA approved for NASAL specimens only), is one component of a comprehensive MRSA colonization surveillance program. It is not intended to diagnose MRSA infection nor to guide or monitor treatment for MRSA infections.   Culture, Urine     Status: None   Collection Time: 08/26/14 11:34 PM  Result Value Ref Range Status   Specimen Description URINE, RANDOM  Final   Special Requests NONE  Final   Culture  Setup Time   Final    08/27/2014 11:19 Performed at [facility] Count   Final    >=100,000 COLONIES/ML Performed at [facility]    Culture   Final    ESCHERICHIA COLI Performed at [facility]    Report Status 08/29/2014 FINAL  Final   Organism ID, Bacteria ESCHERICHIA COLI  Final      Susceptibility   Escherichia coli - MIC*    AMPICILLIN 8 SENSITIVE Sensitive     CEFAZOLIN <=4 SENSITIVE Sensitive     CEFTRIAXONE <=1 SENSITIVE Sensitive     CIPROFLOXACIN <=0.25 SENSITIVE Sensitive     GENTAMICIN <=1 SENSITIVE Sensitive     LEVOFLOXACIN <=0.12 SENSITIVE Sensitive     NITROFURANTOIN <=16 SENSITIVE Sensitive     TOBRAMYCIN <=1 SENSITIVE Sensitive     TRIMETH/SULFA <=20 SENSITIVE Sensitive     PIP/TAZO <=4 SENSITIVE Sensitive     * ESCHERICHIA COLI  Culture, blood (routine x 2)      Status: None (Preliminary result)   Collection Time: 08/27/14  8:30 AM  Result Value Ref Range Status   Specimen Description BLOOD LEFT HAND  Final   Special Requests BOTTLES DRAWN AEROBIC ONLY 1 CC  Final   Culture  Setup Time   Final    08/27/2014 19:25 Performed at [facility]    Culture   Final           BLOOD CULTURE RECEIVED NO GROWTH TO DATE CULTURE WILL BE HELD FOR 5 DAYS BEFORE ISSUING A FINAL NEGATIVE REPORT Note: Culture results may be compromised due to an inadequate volume of blood received in culture bottles. Performed at [facility]    Report Status PENDING  Incomplete  Culture, blood (routine x 2)     Status: None (Preliminary result)   Collection Time: 08/27/14  8:37 AM  Result Value Ref Range Status   Specimen Description BLOOD LEFT HAND  Final   Special Requests BOTTLES DRAWN AEROBIC ONLY 4CC  Final   Culture  Setup Time   Final    08/27/2014 19:25 Performed at [facility]    Culture   Final           BLOOD CULTURE RECEIVED NO GROWTH TO DATE CULTURE WILL BE HELD FOR 5 DAYS BEFORE ISSUING A FINAL NEGATIVE REPORT Performed at [facility]    Report Status PENDING  Incomplete     Scheduled Meds: . antiseptic oral rinse  7 mL Mouth Rinse BID  . aspirin  325 mg Oral Daily  . folic acid  1 mg Oral Daily  . furosemide  40 mg Oral BID  . insulin aspart  0-15 Units Subcutaneous TID AC & HS  . ipratropium-albuterol  3 mL Nebulization TID  . levofloxacin  750 mg Oral Daily  . magnesium oxide  400 mg Oral Daily  . morphine  30 mg Oral 3 times per day  . multivitamin with minerals  1 tablet Oral Daily  . nicotine  21 mg Transdermal Q24H  . potassium chloride  40 mEq Oral QID  . simvastatin  10 mg Oral QPM  . sodium chloride  10-40 mL Intracatheter Q12H  . sodium chloride  3 mL Intravenous Q12H  . thiamine  100 mg Oral Daily   Or  . thiamine  100 mg Intravenous Daily   Continuous Infusions:     Time: 30 minutes   Vassie Loll, MD [facility] Pager [phone]  If 7PM-7AM, please contact night-coverage [email or web address] Password [email or web address] 09/01/2014, 9:37 PM   LOS: 6 days

## 2014-09-01 NOTE — Progress Notes (Signed)
UR COMPLETED  

## 2014-09-02 LAB — CULTURE, BLOOD (ROUTINE X 2)
Culture: NO GROWTH
Culture: NO GROWTH

## 2014-09-02 LAB — CBC
HEMATOCRIT: 27.7 % — AB (ref 36.0–46.0)
HEMOGLOBIN: 9.4 g/dL — AB (ref 12.0–15.0)
MCH: 35.3 pg — ABNORMAL HIGH (ref 26.0–34.0)
MCHC: 33.9 g/dL (ref 30.0–36.0)
MCV: 104.1 fL — AB (ref 78.0–100.0)
Platelets: 263 10*3/uL (ref 150–400)
RBC: 2.66 MIL/uL — AB (ref 3.87–5.11)
RDW: 13.5 % (ref 11.5–15.5)
WBC: 9.5 10*3/uL (ref 4.0–10.5)

## 2014-09-02 LAB — BASIC METABOLIC PANEL
Anion gap: 10 (ref 5–15)
BUN: 7 mg/dL (ref 6–23)
CHLORIDE: 95 meq/L — AB (ref 96–112)
CO2: 24 meq/L (ref 19–32)
Calcium: 8.2 mg/dL — ABNORMAL LOW (ref 8.4–10.5)
Creatinine, Ser: 0.43 mg/dL — ABNORMAL LOW (ref 0.50–1.10)
GFR calc Af Amer: >90 mL/min (ref >90)
GFR calc non Af Amer: >90 mL/min (ref >90)
Glucose, Bld: 128 mg/dL — ABNORMAL HIGH (ref 70–99)
Potassium: 4.3 mEq/L (ref 3.7–5.3)
Sodium: 129 mEq/L — ABNORMAL LOW (ref 137–147)

## 2014-09-02 LAB — GLUCOSE, CAPILLARY: Glucose-Capillary: 116 mg/dL — ABNORMAL HIGH (ref 70–99)

## 2014-09-02 MED ORDER — LEVOFLOXACIN 750 MG PO TABS: 750.0000 mg | ORAL_TABLET | Freq: Every day | ORAL | Status: DC

## 2014-09-02 MED ORDER — ASPIRIN EC 81 MG PO TBEC: 81.0000 mg | DELAYED_RELEASE_TABLET | Freq: Every day | ORAL | Status: DC

## 2014-09-02 NOTE — Progress Notes (Addendum)
CARE MANAGEMENT NOTE 09/02/2014  Patient:  [name], [name]   Account Number:  [id number]  Date Initiated:  08/31/2014  Documentation initiated by:  Edwyna Shell  Subjective/Objective Assessment:   68 yo female admitted with sepsis secondary to UTI and Pna     Action/Plan:   discharge planning   Anticipated DC Date:  09/03/2014   Anticipated DC Plan:  [place]  CM consult      [facility] Choice  HOME HEALTH   Choice offered to / List presented to:  C-1 Patient        [place] arranged  HH-1 RN  [place] PT      [place].   Status of service:  Completed, signed off Medicare Important Message given?  YES (If response is "NO", the following Medicare IM given date fields will be blank) Date Medicare IM given:  09/01/2014 Medicare IM given by:  Olga Coaster Date Additional Medicare IM given:  09/02/2014 Additional Medicare IM given by:  Jonnie Finner  Discharge Disposition:  [place]  Per UR Regulation:    If discussed at Long Length of Stay Meetings, dates discussed:    Comments:   09/02/2014 11:36 AM  NCM spoke to pt and offered choice for [facility]. Pt requested [facility] for HH. States she has RW at home.Jonnie Finner RN CCM Case Mgmt phone [phone]      08/31/14 Edwyna Shell RN BSN CM [phone] CM will continue to follow

## 2014-09-02 NOTE — Progress Notes (Signed)
Medicare Important Message given? YES  (If response is "NO", the following Medicare IM given date fields will be blank)  Date Medicare IM given:  09/02/2014 Medicare IM given by: Tru Leopard  

## 2014-09-02 NOTE — Discharge Summary (Signed)
Physician Discharge Summary  [name] YQM:[id number] DOB: 1946-08-29 DOA: 08/26/2014  PCP: Redmond Baseman, MD  Admit date: 08/26/2014 Discharge date: 09/02/2014  Recommendations for Outpatient Follow-up:  1. Pt will need to follow up with PCP in 2 weeks post discharge 2. Please obtain BMP on 09/05/14  Discharge Diagnoses:  Sepsis -Ldue to pneumonia and UTI -The patient received a dose of levofloxacin on 08/26/2014 @2340  -Unfortunately, blood cultures were not previously obtained -08/27/2014 Blood cultures 2 sets--negative so far -urine culture-->positive for E. Coli -appreciate CCM inputs and recommendations  -Procalcitonin 0.57-->0.28--><0.10 -Patient is BP remains stable despite no pressors -will continue narrow antibiotics to levaquin now base on culture data  -will transfer to telemetry bed  Acute respiratory failure -due to pneumonia and acute on chronic CHF -significantly improved with diuresis -continue pulmonary hygiene and oxygen supplementation -mild effusion on CXR but not enough for thoracentesis to be done -The patient was weaned  to room air and remained stable  Pneumonia -IV abx as discussed, narrow coverage pending culture data -continue pulm hygiene -continue bronchodilators -Intravenous vancomycin and imipenem were changed to po levofloxacin monotherapy and pt remained clinically stablepatient will go home with 4 additional days of oral levofloxacin which will complete 10 days of antibiotics total.    Acute on chronic Diastolic CHF -neg >4.6L in past 36 hours, thereafter, the patient was maintained on a negative fluid balance  -continue daily weights; strict I's and O's and low sodium diet -will continue diuresis as recommended by cardiology -replete electrolytes as needed -Repeat BMP 09/05/2014   Atrial fibrillation with RVR -Likely due to the patient's sepsis -now back in sinus -07/27/2014 Lexiscan was normal with normal  EF -07/27/2014 echocardiogram EF 65-70 percent -Appreciate cardiology followup -08/28/2014 echocardiogram EF 60-65% -Continue aspirin 81 mg daily after d/c  hypertension -The patient remained off of her Zebeta and losartan during the entire hospitalization -Her blood pressure and heart rate remained stable off of the medications  -She will not be discharged on any antihypertensive medications  -She will follow-up with her primary care provider for a follow-up to determine the  future need for antihypertensive meds   Elevated Troponin -likely demand ischemia -cardiology following; will follow rec's  AKI -due to UTI +hypotension associated with sepsis and hemodynamic changes -improved with IVF and stable with diuretics -Serum creatinine 0.43 on the day of discharge   Alcohol Dependence - patient drank "half gallon" vodka every week x 1-66yrs -past few weeks--"4 cups" vodka daily -continue CIWA protocol  Hyponatremia -due to volume depletion and diuretics initially -will monitor -BMP within one week after d/c  Hypokalemia -will continue repletion as needed  Diabetes mellitus type II -continue NovoLog sliding scale -08/26/2014 hemoglobin A1c 5.5 -She will go home on her previous regimen of  Januvia  Left arm weakness -MRI brain--negative for acute findings -continue ASA  Chronic pain with chronic opioid dependence -Continue MS Contin q8h TID and oxyIR for breakthrough -fentanyl discontinued; will maximize oral agents for pain control -follow up with pain management physician after d/c  Leukocytosis -WBC's down to normal range now -no fever -BP stable  Discharge Condition: stable  Disposition: home  Diet:carb modified Wt Readings from Last 3 Encounters:  09/02/14 69 kg (152 lb 1.9 oz)  08/28/14 72.576 kg (160 lb)  08/07/14 68.448 kg (150 lb 14.4 oz)    History of present illness:  68 year old female with history of coronary artery disease, diabetes mellitus  type 2, COPD, hyperlipidemia, tobacco use presented to the emergency  department on 08/26/2014 because of left approximate weakness and dysesthesias. The patient's husband states that the patient has been confused and having hallucinations for 2 weeks prior to admission with associated generalized weakness and worsening dyspnea on exertion for the period of time. He stated that the patient was very resistant to see a physician or go to the hospital, until she developed left upper extremity weakness. The patient was found to be hypotensive with acute kidney injury. In addition, she was hyponatremic with a sodium of 121 and hypokalemic with potassium of less than 2.2. The patient was admitted to the ICU where the patient continued to be hypotensive. The patient was started on Neo-Synephrine after she received over 4 L of normal saline. The patient subsequently developed hypoxemia and was placed on a nonrebreather. Critical care medicine due to the patient's acute medical illness and need for pressor support. Cardiology was consulted due to the patient's history of ischemic heart disease and paroxysmal atriall fibrillation. Repeat echocardiogram was obtained and showed EF 60-65%. The patient's atrial fibrillation improved with treatment of her sepsis. The patient was started on intravenous vancomycin and imipenem along with levofloxacin. She was de-escalated to po levofloxacin. The patient remained hemodynamically stable. She was weaned to room air. The patient will go home with levofloxacin for 4 more days which will complete 10 days of antibiotic therapy.    Consultants: CCM Cardiology  Discharge Exam: Filed Vitals:   09/02/14 0608  BP: 101/64  Pulse: 84  Temp: 98.6 F (37 C)  Resp: 18   Filed Vitals:   09/01/14 1527 09/01/14 2037 09/01/14 2120 09/02/14 0608  BP:   101/57 101/64  Pulse:   97 84  Temp:   99 F (37.2 C) 98.6 F (37 C)  TempSrc:   Oral Oral  Resp:   19 18  Height:        Weight:    69 kg (152 lb 1.9 oz)  SpO2: 91% 92% 94% 97%   General: A&O x 3, NAD, pleasant, cooperative Cardiovascular: RRR, no rub, no gallop, no S3 Respiratory: left basilar crackles greater than right basilar crackles. No wheezing. There are movement Abdomen:soft, nontender, nondistended, positive bowel sounds Extremities: 1+LE  edema, No lymphangitis, no petechiae  Discharge Instructions      Discharge Instructions    Diet - low sodium heart healthy    Complete by:  As directed      Increase activity slowly    Complete by:  As directed             Medication List    STOP taking these medications        bisoprolol 10 MG tablet  Commonly known as:  ZEBETA     losartan 25 MG tablet  Commonly known as:  COZAAR      TAKE these medications        albuterol (2.5 MG/3ML) 0.083% nebulizer solution  Commonly known as:  PROVENTIL  Take 2.5 mg by nebulization every 6 (six) hours as needed.     albuterol 108 (90 BASE) MCG/ACT inhaler  Commonly known as:  PROVENTIL HFA;VENTOLIN HFA  Inhale 2-4 puffs into the lungs every 6 (six) hours as needed for wheezing (wheezing).     ALPRAZolam 0.25 MG tablet  Commonly known as:  XANAX  Take one tablet daily or as needed     aspirin EC 81 MG tablet  Take 1 tablet (81 mg total) by mouth daily.     ATROVENT HFA IN  Inhale 17 mcg into the lungs at bedtime.     BIOTIN PO  Take 1 tablet by mouth daily.     diclofenac 75 MG EC tablet  Commonly known as:  VOLTAREN  Take 1 tablet by mouth 2 (two) times daily as needed for mild pain or moderate pain (pain).     furosemide 40 MG tablet  Commonly known as:  LASIX  Take 1 tablet (40 mg total) by mouth 2 (two) times daily.     gabapentin 600 MG tablet  Commonly known as:  NEURONTIN  Take 600 mg by mouth 3 (three) times daily.     JANUVIA 25 MG tablet  Generic drug:  sitaGLIPtin  Take 1 tablet by mouth daily.     levofloxacin 750 MG tablet  Commonly known as:  LEVAQUIN  Take 1  tablet (750 mg total) by mouth daily.     metolazone 2.5 MG tablet  Commonly known as:  ZAROXOLYN  Take 1 tablet (2.5 mg total) by mouth daily.     morphine 30 MG 24 hr capsule  Commonly known as:  KADIAN  Take 30 mg by mouth daily.     morphine 30 MG 12 hr tablet  Commonly known as:  MS CONTIN  Take 30 mg by mouth 4 (four) times daily.     nicotine 21 mg/24hr patch  Commonly known as:  NICODERM CQ - dosed in mg/24 hours  Place 1 patch onto the skin daily.     nitroGLYCERIN 0.4 MG SL tablet  Commonly known as:  NITROSTAT  Place 1 tablet (0.4 mg total) under the tongue as needed.     oxyCODONE 15 MG immediate release tablet  Commonly known as:  ROXICODONE  Take 15 mg by mouth every 6 (six) hours as needed.     simvastatin 10 MG tablet  Commonly known as:  ZOCOR  Take 10 mg by mouth every evening.     simvastatin 10 MG tablet  Commonly known as:  ZOCOR  Take 1 tablet (10 mg total) by mouth every evening.         The results of significant diagnostics from this hospitalization (including imaging, microbiology, ancillary and laboratory) are listed below for reference.    Significant Diagnostic Studies: Ct Head Wo Contrast  08/26/2014   CLINICAL DATA:  Progressive left upper extremity weakness over the past 1 week.  EXAM: CT HEAD WITHOUT CONTRAST  TECHNIQUE: Contiguous axial images were obtained from the base of the skull through the vertex without intravenous contrast.  COMPARISON:  None.  FINDINGS: Mild age related cortical atrophy. Ventricular system normal in size and appearance for age. No mass lesion. No midline shift. No acute hemorrhage or hematoma. No extra-axial fluid collections. No evidence of acute infarction.  No skull fracture or other focal osseous abnormality involving the skull. Visualized paranasal sinuses, bilateral mastoid air cells and bilateral middle ear cavities well-aerated. Mild bilateral carotid siphon atherosclerosis.  IMPRESSION: 1. No acute  intracranial abnormality. 2. Mild age related cortical atrophy.   Electronically Signed   By: Hulan Saas M.D.   On: 08/26/2014 17:04   Ct Chest Wo Contrast  08/29/2014   CLINICAL DATA:  Persistent cough, shortness of breath.  EXAM: CT CHEST WITHOUT CONTRAST  TECHNIQUE: Multidetector CT imaging of the chest was performed following the standard protocol without IV contrast.  COMPARISON:  Radiographs of same date ; CT scan of August 06, 2011.  FINDINGS: No pneumothorax is noted. Moderate left pleural effusion is  noted with associated atelectasis of the left lower lobe. Multiple focal airspace opacities are noted throughout both lungs most consistent with multifocal pneumonia. This is most prominently seen posteriorly in the right lower lobe. Left perihilar airspace opacity is noted in the left upper lobe with associated air bronchograms most consistent with perihilar pneumonia. Coronary artery calcifications are noted. Minimal pericardial effusion is noted. Minimally enlarged precarinal lymph node is noted which is not significantly changed compared to prior exam and therefore most likely reactive in etiology. Atherosclerotic calcifications of thoracic aorta are noted. No definite aneurysm is noted. Left-sided PICC line is noted with distal tip at the cavoatrial junction. Old left rib fractures are noted.  Abnormal low density is noted in portions of the right hepatic lobe which may represent fatty infiltration, but CT scan of the liver with contrast administration is recommended for further evaluation. The remaining portion of the visualized upper abdomen appears normal.  IMPRESSION: Moderate left pleural effusion is noted with associated atelectasis of the left lower lobe.  Multiple focal airspace opacities are noted throughout both lungs most consistent with multifocal pneumonia. This is most prominently seen in the right lower lobe. Also noted is perihilar airspace opacity in the left upper lobe with  associated air bronchograms also consistent with pneumonia. Followup CT scan in 2-3 weeks following antibiotic treatment for pneumonia is recommended to ensure resolution and rule out underlying neoplasm.  Abnormal segmental low density is noted in the right hepatic lobe which may simply represent fatty infiltration, but further evaluation with CT scan with contrast is recommended to rule out neoplasm or other pathology.   Electronically Signed   By: Roque Lias M.D.   On: 08/29/2014 18:06   Mr Brain Wo Contrast  08/28/2014   CLINICAL DATA:  Breast cancer. Increased lethargy. Difficulty holding still. Weakness of the left arm.  EXAM: MRI HEAD WITHOUT CONTRAST  TECHNIQUE: Multiplanar, multiecho pulse sequences of the brain and surrounding structures were obtained without intravenous contrast.  COMPARISON:  CT head without contrast to 08/26/2014.  FINDINGS: The study is mildly degraded by patient motion. No acute cortical infarct, hemorrhage, or mass lesion is present. Mild generalized atrophy is within normal limits for age. No significant white matter disease is evident. Flow is present in the major intracranial arteries.  No significant extraaxial fluid collection is present. The patient is status post bilateral lens replacements. The paranasal sinuses and mastoid air cells are clear.  IMPRESSION: 1. Normal MRI appearance of the brain for age.   Electronically Signed   By: Gennette Pac M.D.   On: 08/28/2014 12:39   Dg Chest Port 1 View  08/29/2014   CLINICAL DATA:  Pneumonia  EXAM: PORTABLE CHEST - 1 VIEW  COMPARISON:  Portable chest x-ray of August 27, 2014 at 10:11 a.m.  FINDINGS: There has been progressive opacification of the inferior 1/2 of the left hemi thorax with opacification of the left heart border and the left hemidiaphragm. Patchy interstitial densities in the mid and lower right lung are more conspicuous today. The pulmonary vascularity is not engorged. The left sided PICC line tip  projects over the midportion of the SVC. There is no pneumothorax. Pleural fluid is likely present on the left. There is an un healed fracture of the posterior aspect of the left eighth rib. There are surgical clips in the right axillary region.  IMPRESSION: Worsening of left lower lobe atelectasis or pneumonia over the past 2 days. Patchy increased density has developed in the right  lung as well.   Electronically Signed   By: Emit Kuenzel  [place]   On: 08/29/2014 07:47   Dg Chest Port 1 View  08/27/2014   CLINICAL DATA:  New left-sided PICC line.  EXAM: PORTABLE CHEST - 1 VIEW  COMPARISON:  08/27/2014  FINDINGS: Left-sided PICC line has been placed, tip overlying the level of the superior vena cava. There is increasing infiltrate in the left lung base. No evidence for pneumothorax. Persistent interstitial edema. Again noted is left posterior ninth rib fracture. No pneumothorax.  IMPRESSION: 1. Interval placement of left-sided PICC line. 2. Developing left lower lobe infiltrate.   Electronically Signed   By: Rosalie Gums M.D.   On: 08/27/2014 10:43   Dg Chest Port 1 View  08/27/2014   CLINICAL DATA:  68 year old female with respiratory failure  EXAM: PORTABLE CHEST - 1 VIEW  COMPARISON:  Prior chest x-ray 06/23/2014  FINDINGS: Patchy airspace infiltrate in the left mid lung and left retrocardiac region concerning for multifocal pneumonia. Stable mild cardiomegaly. Atherosclerotic calcification present in the transverse aorta. Difficult to exclude a small left pleural effusion. No pneumothorax. Background of mild pulmonary vascular congestion bordering on edema. Surgical clips noted in the right axilla. No acute osseous abnormality.  IMPRESSION: 1. Patchy interstitial and airspace opacities in the left mid and lower lung concerning for pneumonia. 2. Background of superimposed pulmonary vascular congestion bordering on mild interstitial edema. 3. Stable cardiomegaly. 4. Aortic atherosclerosis.   Electronically Signed    By: Malachy Moan M.D.   On: 08/27/2014 08:52     Microbiology: Recent Results (from the past 240 hour(s))  MRSA PCR Screening     Status: None   Collection Time: 08/26/14  9:40 PM  Result Value Ref Range Status   MRSA by PCR NEGATIVE NEGATIVE Final    Comment:        The GeneXpert MRSA Assay (FDA approved for NASAL specimens only), is one component of a comprehensive MRSA colonization surveillance program. It is not intended to diagnose MRSA infection nor to guide or monitor treatment for MRSA infections.   Culture, Urine     Status: None   Collection Time: 08/26/14 11:34 PM  Result Value Ref Range Status   Specimen Description URINE, RANDOM  Final   Special Requests NONE  Final   Culture  Setup Time   Final    08/27/2014 11:19 Performed at [facility] Count   Final    >=100,000 COLONIES/ML Performed at [facility]    Culture   Final    ESCHERICHIA COLI Performed at [facility]    Report Status 08/29/2014 FINAL  Final   Organism ID, Bacteria ESCHERICHIA COLI  Final      Susceptibility   Escherichia coli - MIC*    AMPICILLIN 8 SENSITIVE Sensitive     CEFAZOLIN <=4 SENSITIVE Sensitive     CEFTRIAXONE <=1 SENSITIVE Sensitive     CIPROFLOXACIN <=0.25 SENSITIVE Sensitive     GENTAMICIN <=1 SENSITIVE Sensitive     LEVOFLOXACIN <=0.12 SENSITIVE Sensitive     NITROFURANTOIN <=16 SENSITIVE Sensitive     TOBRAMYCIN <=1 SENSITIVE Sensitive     TRIMETH/SULFA <=20 SENSITIVE Sensitive     PIP/TAZO <=4 SENSITIVE Sensitive     * ESCHERICHIA COLI  Culture, blood (routine x 2)     Status: None (Preliminary result)   Collection Time: 08/27/14  8:30 AM  Result Value Ref Range Status   Specimen Description BLOOD LEFT HAND  Final   Special Requests BOTTLES DRAWN AEROBIC ONLY 1 CC  Final   Culture  Setup Time   Final    08/27/2014 19:25 Performed at [facility]    Culture   Final           BLOOD CULTURE RECEIVED NO GROWTH TO  DATE CULTURE WILL BE HELD FOR 5 DAYS BEFORE ISSUING A FINAL NEGATIVE REPORT Note: Culture results may be compromised due to an inadequate volume of blood received in culture bottles. Performed at [facility]    Report Status PENDING  Incomplete  Culture, blood (routine x 2)     Status: None (Preliminary result)   Collection Time: 08/27/14  8:37 AM  Result Value Ref Range Status   Specimen Description BLOOD LEFT HAND  Final   Special Requests BOTTLES DRAWN AEROBIC ONLY 4CC  Final   Culture  Setup Time   Final    08/27/2014 19:25 Performed at [facility]    Culture   Final           BLOOD CULTURE RECEIVED NO GROWTH TO DATE CULTURE WILL BE HELD FOR 5 DAYS BEFORE ISSUING A FINAL NEGATIVE REPORT Performed at [facility]    Report Status PENDING  Incomplete     Labs: Basic Metabolic Panel:  Recent Labs Lab 08/27/14 1200  08/28/14 0630 08/29/14 0510 08/30/14 0400  08/31/14 0430 09/01/14 0409 09/02/14 0538  NA  --   < > 131* 133* 133*  --  131* 133* 129*  K  --   < > 3.5* 3.0* 2.3*  < > 3.3* 3.4* 4.3  CL  --   < > 87* 86* 88*  --  90* 94* 95*  CO2  --   < > 36* 36* 36*  --  30 27 24   GLUCOSE  --   < > 162* 84 89  --  97 105* 128*  BUN  --   < > 8 5* 5*  --  6 6 7   CREATININE  --   < > 0.54 0.44* 0.42*  --  0.41* 0.44* 0.43*  CALCIUM  --   < > 8.5 8.8 8.1*  --  8.1* 7.9* 8.2*  MG  --   --  1.9 1.4* 1.5  --  1.7 1.5  --   PHOS 3.4  --  1.9* 2.4 3.3  --  3.0  --   --   < > = values in this interval not displayed. Liver Function Tests:  Recent Labs Lab 08/26/14 1625 08/27/14 0134  AST 90* 75*  ALT 28 23  ALKPHOS 368* 297*  BILITOT 0.7 0.5  PROT 7.0 5.8*  ALBUMIN 2.5* 1.9*   No results for input(s): LIPASE, AMYLASE in the last 168 hours. No results for input(s): AMMONIA in the last 168 hours. CBC:  Recent Labs Lab 08/26/14 1625 08/27/14 0134 08/28/14 0530 08/29/14 0510 08/30/14 0400 09/02/14 0538  WBC 14.1* 9.8 12.7* 12.0* 9.3 9.5    NEUTROABS 11.5* 7.4 11.3* 9.7* 7.1  --   HGB 11.1* 9.7* 9.8* 9.5* 8.6* 9.4*  HCT 31.1* 27.5* 28.0* 28.0* 24.3* 27.7*  MCV 101.3* 100.7* 102.9* 103.7* 102.5* 104.1*  PLT 265 212 267 257 205 263   Cardiac Enzymes:  Recent Labs Lab 08/27/14 0445 08/27/14 1011 08/27/14 1611  TROPONINI <0.30 0.53* 1.20*   BNP: Invalid input(s): POCBNP CBG:  Recent Labs Lab 09/01/14 0939 09/01/14 1200 09/01/14 1715 09/01/14 2118 09/02/14 0724  GLUCAP 120* 93  105* 126* 116*    Time coordinating discharge:  Greater than 30 minutes  Signed:  Chrisie Jankovich, DO [facility] Pager: [phone] 09/02/2014, 10:31 AM

## 2014-09-05 DIAGNOSIS — L89152 Pressure ulcer of sacral region, stage 2: Secondary | ICD-10-CM | POA: Diagnosis not present

## 2014-09-05 DIAGNOSIS — L97429 Non-pressure chronic ulcer of left heel and midfoot with unspecified severity: Secondary | ICD-10-CM | POA: Diagnosis not present

## 2014-09-05 DIAGNOSIS — E119 Type 2 diabetes mellitus without complications: Secondary | ICD-10-CM | POA: Diagnosis not present

## 2014-09-05 DIAGNOSIS — J189 Pneumonia, unspecified organism: Secondary | ICD-10-CM | POA: Diagnosis not present

## 2014-09-05 DIAGNOSIS — I10 Essential (primary) hypertension: Secondary | ICD-10-CM | POA: Diagnosis not present

## 2014-09-05 DIAGNOSIS — Z4800 Encounter for change or removal of nonsurgical wound dressing: Secondary | ICD-10-CM | POA: Diagnosis not present

## 2014-09-05 DIAGNOSIS — I5033 Acute on chronic diastolic (congestive) heart failure: Secondary | ICD-10-CM | POA: Diagnosis not present

## 2014-09-05 DIAGNOSIS — I4891 Unspecified atrial fibrillation: Secondary | ICD-10-CM | POA: Diagnosis not present

## 2014-09-06 DIAGNOSIS — E119 Type 2 diabetes mellitus without complications: Secondary | ICD-10-CM | POA: Diagnosis not present

## 2014-09-06 DIAGNOSIS — I5033 Acute on chronic diastolic (congestive) heart failure: Secondary | ICD-10-CM | POA: Diagnosis not present

## 2014-09-06 DIAGNOSIS — L89152 Pressure ulcer of sacral region, stage 2: Secondary | ICD-10-CM | POA: Diagnosis not present

## 2014-09-06 DIAGNOSIS — J189 Pneumonia, unspecified organism: Secondary | ICD-10-CM | POA: Diagnosis not present

## 2014-09-06 DIAGNOSIS — I4891 Unspecified atrial fibrillation: Secondary | ICD-10-CM | POA: Diagnosis not present

## 2014-09-06 DIAGNOSIS — I10 Essential (primary) hypertension: Secondary | ICD-10-CM | POA: Diagnosis not present

## 2014-09-12 DIAGNOSIS — I10 Essential (primary) hypertension: Secondary | ICD-10-CM | POA: Diagnosis not present

## 2014-09-12 DIAGNOSIS — I5033 Acute on chronic diastolic (congestive) heart failure: Secondary | ICD-10-CM | POA: Diagnosis not present

## 2014-09-12 DIAGNOSIS — E119 Type 2 diabetes mellitus without complications: Secondary | ICD-10-CM | POA: Diagnosis not present

## 2014-09-12 DIAGNOSIS — I4891 Unspecified atrial fibrillation: Secondary | ICD-10-CM | POA: Diagnosis not present

## 2014-09-12 DIAGNOSIS — J189 Pneumonia, unspecified organism: Secondary | ICD-10-CM | POA: Diagnosis not present

## 2014-09-12 DIAGNOSIS — L89152 Pressure ulcer of sacral region, stage 2: Secondary | ICD-10-CM | POA: Diagnosis not present

## 2014-09-13 ENCOUNTER — Ambulatory Visit: Payer: BC Managed Care – PPO | Admitting: Cardiology

## 2014-09-14 DIAGNOSIS — L89152 Pressure ulcer of sacral region, stage 2: Secondary | ICD-10-CM | POA: Diagnosis not present

## 2014-09-14 DIAGNOSIS — I10 Essential (primary) hypertension: Secondary | ICD-10-CM | POA: Diagnosis not present

## 2014-09-14 DIAGNOSIS — I4891 Unspecified atrial fibrillation: Secondary | ICD-10-CM | POA: Diagnosis not present

## 2014-09-14 DIAGNOSIS — I5033 Acute on chronic diastolic (congestive) heart failure: Secondary | ICD-10-CM | POA: Diagnosis not present

## 2014-09-14 DIAGNOSIS — E119 Type 2 diabetes mellitus without complications: Secondary | ICD-10-CM | POA: Diagnosis not present

## 2014-09-14 DIAGNOSIS — J189 Pneumonia, unspecified organism: Secondary | ICD-10-CM | POA: Diagnosis not present

## 2014-09-18 ENCOUNTER — Ambulatory Visit: Payer: Managed Care, Other (non HMO) | Admitting: Cardiology

## 2014-09-18 DIAGNOSIS — E119 Type 2 diabetes mellitus without complications: Secondary | ICD-10-CM | POA: Diagnosis not present

## 2014-09-18 DIAGNOSIS — I5033 Acute on chronic diastolic (congestive) heart failure: Secondary | ICD-10-CM | POA: Diagnosis not present

## 2014-09-18 DIAGNOSIS — I4891 Unspecified atrial fibrillation: Secondary | ICD-10-CM | POA: Diagnosis not present

## 2014-09-18 DIAGNOSIS — I10 Essential (primary) hypertension: Secondary | ICD-10-CM | POA: Diagnosis not present

## 2014-09-18 DIAGNOSIS — L89152 Pressure ulcer of sacral region, stage 2: Secondary | ICD-10-CM | POA: Diagnosis not present

## 2014-09-18 DIAGNOSIS — J189 Pneumonia, unspecified organism: Secondary | ICD-10-CM | POA: Diagnosis not present

## 2014-09-19 ENCOUNTER — Ambulatory Visit: Payer: BC Managed Care – PPO | Admitting: Cardiology

## 2014-09-20 ENCOUNTER — Ambulatory Visit (INDEPENDENT_AMBULATORY_CARE_PROVIDER_SITE_OTHER): Payer: BLUE CROSS/BLUE SHIELD | Admitting: Cardiology

## 2014-09-20 ENCOUNTER — Encounter: Payer: Self-pay | Admitting: Cardiology

## 2014-09-20 VITALS — BP 94/67 | HR 94 | Ht 62.0 in | Wt 139.0 lb

## 2014-09-20 DIAGNOSIS — Z79899 Other long term (current) drug therapy: Secondary | ICD-10-CM

## 2014-09-20 DIAGNOSIS — A4151 Sepsis due to Escherichia coli [E. coli]: Secondary | ICD-10-CM

## 2014-09-20 DIAGNOSIS — R609 Edema, unspecified: Secondary | ICD-10-CM

## 2014-09-20 DIAGNOSIS — I480 Paroxysmal atrial fibrillation: Secondary | ICD-10-CM | POA: Insufficient documentation

## 2014-09-20 DIAGNOSIS — I10 Essential (primary) hypertension: Secondary | ICD-10-CM

## 2014-09-20 DIAGNOSIS — R778 Other specified abnormalities of plasma proteins: Secondary | ICD-10-CM | POA: Insufficient documentation

## 2014-09-20 DIAGNOSIS — J189 Pneumonia, unspecified organism: Secondary | ICD-10-CM

## 2014-09-20 DIAGNOSIS — F17200 Nicotine dependence, unspecified, uncomplicated: Secondary | ICD-10-CM

## 2014-09-20 DIAGNOSIS — R7989 Other specified abnormal findings of blood chemistry: Secondary | ICD-10-CM

## 2014-09-20 DIAGNOSIS — R600 Localized edema: Secondary | ICD-10-CM

## 2014-09-20 NOTE — Assessment & Plan Note (Signed)
Has decreased to half a ppd and is to stop in 2 days.

## 2014-09-20 NOTE — Assessment & Plan Note (Addendum)
Secondary to demand ischemia during hospitalization for sepsis with episode of PAF, neg. Stress test in 07/2014 with normal EF.  Monitor chest pain.

## 2014-09-20 NOTE — Assessment & Plan Note (Signed)
Recently treated -CT of chest- radiology recommends follow up with contrast to rule out neoplasm and ensure resolution of PNA, also she had low density in Rt hepatic lobe which may be fatty infiltrate but radiology recommended follow up to rule out neoplasm.  Will leave to Dr. Jacelyn Grip.

## 2014-09-20 NOTE — Assessment & Plan Note (Signed)
Post hospital visit, with PNA and UTI.  Now much improved.  To see PCP on Friday.

## 2014-09-20 NOTE — Progress Notes (Signed)
09/20/2014   PCP: Anthoney Harada, MD   Chief Complaint  Patient presents with  . Hospitalization Follow-up    Hosp. follow up, Pt. experiencing chest pain and SOB, Pt. denies all other symptoms.    Primary Cardiologist:Dr. P. Martinique    HPI:  69 yo WF with a history of coronary disease and has had multiple angioplasty procedures of the first obtuse marginal vessel. She is status post stenting of the right coronary using a 3.0 x 18 mm Tetra stent in 2000. Her last cardiac catheterization in July of 2010 showed nonobstructive disease. Her myoview study in 07/2014 was normal. She has a history of tobacco and Etoh abuse. She has chronic depression. Her last Echo 08/2014 with EF 60-65% moderate MR, PA peak pressure 39 mm Hg overall poor quality.  Her major complaint in Oct.was of swelling in her legs. This began following complex bilateral hip surgery in 2012. It has progressed this past year. She is on lasix 40 mg daily. She wears support hose. She has worn Una boots and had ACE wraps as well with only transient improvement. She has chronic SOB. She had  complained of chest pain monthly when she is under stress. She had difficulty walking prior to hospitalization and this continues.   Pt is here today for follow up after hospitalization in Dec. 2015 for sepsis due to PNA and UTI.  During this acute episode she had rapid atrial fib and mild elevation of troponin at 1.20.  Prior to discharge she was in SR, it was felt no anticoagulation was warranted for brief episode of PAF with acute illness.  Her troponin elevation was felt to be due to demand ischemia.   Today she reports one episode of chest pain relief with NTG since discharge.  No SOB and her edema is much improved.  At discharge her Na was 129. She is to see PCP on Friday and plans to have labs checked at that time.   Her LFTs at least AST was 75 during hospitalization.  Alk phos 297. Her K+ was 2.4.   She reports she  is smoking half PPD with plans to stop in 2 days.  She does not use nicoderm patch and smoke at same time.  She has stopped ETOH down from 2 half gallons of hard liquor a week.   She was congratulated. And encouraged to completley stop smoking in 2 days as planned.  She is back on most meds she was on prior to hospitalization.  He BP is low today though no lightheadedness or dizziness.    Allergies  Allergen Reactions  . Penicillins   . Zithromax [Azithromycin Dihydrate]     ORAL ULCERS     Current Outpatient Prescriptions  Medication Sig Dispense Refill  . albuterol (PROVENTIL HFA;VENTOLIN HFA) 108 (90 BASE) MCG/ACT inhaler Inhale 2-4 puffs into the lungs every 6 (six) hours as needed for wheezing (wheezing).     Marland Kitchen albuterol (PROVENTIL) (2.5 MG/3ML) 0.083% nebulizer solution Take 2.5 mg by nebulization every 6 (six) hours as needed.      . ALPRAZolam (XANAX) 0.25 MG tablet Take one tablet daily or as needed 90 tablet 3  . aspirin EC 81 MG tablet Take 1 tablet (81 mg total) by mouth daily. 30 tablet 0  . bisoprolol (ZEBETA) 10 MG tablet Take 10 mg by mouth every other day.     . diclofenac (VOLTAREN) 75 MG EC tablet Take 1 tablet by  mouth 2 (two) times daily as needed for mild pain or moderate pain (pain).     . furosemide (LASIX) 40 MG tablet Take 1 tablet (40 mg total) by mouth 2 (two) times daily. 180 tablet 3  . gabapentin (NEURONTIN) 600 MG tablet Take 600 mg by mouth 3 (three) times daily.     . Ipratropium Bromide HFA (ATROVENT HFA IN) Inhale 17 mcg into the lungs at bedtime.     Marland Kitchen JANUVIA 25 MG tablet Take 1 tablet by mouth daily.    Marland Kitchen losartan (COZAAR) 25 MG tablet Take 25 mg by mouth daily.  1  . morphine (KADIAN) 30 MG 24 hr capsule Take 30 mg by mouth daily.      Marland Kitchen morphine (MS CONTIN) 30 MG 12 hr tablet Take 30 mg by mouth 4 (four) times daily.   0  . nicotine (NICODERM CQ - DOSED IN MG/24 HOURS) 21 mg/24hr patch Place 1 patch onto the skin daily.      . nitroGLYCERIN  (NITROSTAT) 0.4 MG SL tablet Place 1 tablet (0.4 mg total) under the tongue as needed. 90 tablet 6  . oxyCODONE (ROXICODONE) 15 MG immediate release tablet Take 15 mg by mouth every 6 (six) hours as needed.      . simvastatin (ZOCOR) 10 MG tablet Take 10 mg by mouth every evening.     No current facility-administered medications for this visit.    Past Medical History  Diagnosis Date  . Myocardial infarction 2000  . Diabetes mellitus type 2, insulin dependent   . COPD (chronic obstructive pulmonary disease)   . Hyperlipidemia   . Breast cancer   . OA (osteoarthritis) of knee   . Hypertension   . Coronary artery disease   . Asthma   . Elevated LFTs   . ETOH abuse   . Tobacco abuse   . Osteoarthritis     Past Surgical History  Procedure Laterality Date  . Breast surgery  1991    right mastectomy  . Vulvectomy  1981    partial  . Neck fusion    . Pelvic laparoscopy  2002    RSO  . Cesarean section  '78, '80, '81    x 3  . Hysteroscopy      D & C  . Cardiac catheterization  04/05/2009    EF 60%  . Lobectomy    . Tonsillectomy and adenoidectomy    . Mastectomy    . Coronary angioplasty  08/1998    x2 OF A BIFURCATION OM-1, OM-2 LESION  . Coronary angioplasty with stent placement  01/1999    MID FIRST OBTUSE MARGINAL VESSEL  . Coronary angioplasty with stent placement  07/1999    STENTING AT THE CRUX OF THE RIGHT CORONARY ARTERY WITH A 3.8MM X 18MM TETRA STENT  . Cardiovascular stress test  01/31/2005    EF 58%  . Total hip arthroplasty  08/2010    bilat    GYI:RSWNIOE:VO colds or fevers,  weight loss of 13 pounds at least -with the sepsis Skin:no rashes or ulcers HEENT:no blurred vision, no congestion CV:see HPI PUL:see HPI GI:no diarrhea constipation or melena, no indigestion GU:no hematuria, no dysuria MS:no joint pain, no claudication Neuro:no syncope, no lightheadedness Endo:+ diabetes, no thyroid disease  Wt Readings from Last 3 Encounters:  09/20/14 139  lb (63.05 kg)  09/02/14 152 lb 1.9 oz (69 kg)  08/28/14 160 lb (72.576 kg)    PHYSICAL EXAM BP 94/67 mmHg  Pulse 94  Ht _0  (1.575 m)  Wt 139 lb (63.05 kg)  BMI 25.42 kg/m2 General:Pleasant affect, NAD Skin:Warm and dry, brisk capillary refill HEENT:normocephalic, sclera clear, mucus membranes moist Neck:supple, no JVD, no bruits  Heart:S1S2 RRR without murmur, gallup, rub or click Lungs:clear without rales, rhonchi, or wheezes QWQ:VLDK, non tender, + BS, do not palpate liver spleen or masses Ext:1+ lower ext edema mid calf down -support stockings in place, 2+ radial pulses Neuro:alert and oriented X 3, MAE, follows commands, + facial symmetry   ASSESSMENT AND PLAN Sepsis Post hospital visit, with PNA and UTI.  Now much improved.  To see PCP on Friday.  PAF (paroxysmal atrial fibrillation) During acute illness, maintaining SR, no need for anticoagulation at this point.  Will follow up with Dr. P. Martinique in 6 weeks.  Elevated troponin Secondary to demand ischemia during hospitalization for sepsis with episode of PAF, neg. Stress test in 07/2014 with normal EF.  Monitor chest pain.  Hypertension Now on zebeta, pt take 10 mg every other day.  Her BP is low today and I am stopping metolazone- recent hypokalemia with metolazone.  She is on cozaar 25 mg daily as well- this may need to be stopped.       Tobacco dependence Has decreased to half a ppd and is to stop in 2 days.  Edema extremities Continues but much improved  Pneumonia Recently treated -CT of chest- radiology recommends follow up with contrast to rule out neoplasm and ensure resolution of PNA, also she had low density in Rt hepatic lobe which may be fatty infiltrate but radiology recommended follow up to rule out neoplasm.  Will leave to Dr. Jacelyn Grip.

## 2014-09-20 NOTE — Assessment & Plan Note (Signed)
Continues but much improved

## 2014-09-20 NOTE — Patient Instructions (Addendum)
Weigh daily Call [phone] if weight climbs more than 3 pounds in a day or 5 pounds in a week. No salt to very little salt in your diet.  No more than 2000 mg in a day. Call if increased shortness of breath or increased swelling.   stop smoking as planned  Your physician has recommended you make the following change in your medication: Stop Zaroxolyn  Your physician has recommended you make the following change in your medication: [place] recommends that you schedule a follow-up appointment in: Keep appointment with Dr Martinique 11/02/2014

## 2014-09-20 NOTE — Assessment & Plan Note (Signed)
Now on zebeta, pt take 10 mg every other day.  Her BP is low today and I am stopping metolazone- recent hypokalemia with metolazone.  She is on cozaar 25 mg daily as well- this may need to be stopped.

## 2014-09-20 NOTE — Assessment & Plan Note (Signed)
During acute illness, maintaining SR, no need for anticoagulation at this point.  Will follow up with Dr. P. Martinique in 6 weeks.

## 2014-09-21 DIAGNOSIS — E119 Type 2 diabetes mellitus without complications: Secondary | ICD-10-CM | POA: Diagnosis not present

## 2014-09-21 DIAGNOSIS — L89152 Pressure ulcer of sacral region, stage 2: Secondary | ICD-10-CM | POA: Diagnosis not present

## 2014-09-21 DIAGNOSIS — I4891 Unspecified atrial fibrillation: Secondary | ICD-10-CM | POA: Diagnosis not present

## 2014-09-21 DIAGNOSIS — I5033 Acute on chronic diastolic (congestive) heart failure: Secondary | ICD-10-CM | POA: Diagnosis not present

## 2014-09-21 DIAGNOSIS — I10 Essential (primary) hypertension: Secondary | ICD-10-CM | POA: Diagnosis not present

## 2014-09-21 DIAGNOSIS — J189 Pneumonia, unspecified organism: Secondary | ICD-10-CM | POA: Diagnosis not present

## 2014-09-29 ENCOUNTER — Other Ambulatory Visit: Payer: Self-pay

## 2014-09-29 DIAGNOSIS — Z1231 Encounter for screening mammogram for malignant neoplasm of breast: Secondary | ICD-10-CM

## 2014-10-10 ENCOUNTER — Ambulatory Visit
Admission: RE | Admit: 2014-10-10 | Discharge: 2014-10-10 | Disposition: A | Discharge status: Home / Self Care | Payer: BLUE CROSS/BLUE SHIELD | Source: Ambulatory Visit

## 2014-10-10 DIAGNOSIS — Z1231 Encounter for screening mammogram for malignant neoplasm of breast: Secondary | ICD-10-CM

## 2014-10-11 ENCOUNTER — Encounter: Payer: Self-pay | Admitting: Cardiology

## 2014-11-02 ENCOUNTER — Ambulatory Visit: Payer: Medicare Other | Admitting: Cardiology

## 2014-11-28 DIAGNOSIS — G894 Chronic pain syndrome: Secondary | ICD-10-CM | POA: Diagnosis not present

## 2014-11-28 DIAGNOSIS — M5136 Other intervertebral disc degeneration, lumbar region: Secondary | ICD-10-CM | POA: Diagnosis not present

## 2014-12-28 DIAGNOSIS — D2372 Other benign neoplasm of skin of left lower limb, including hip: Secondary | ICD-10-CM | POA: Diagnosis not present

## 2014-12-28 DIAGNOSIS — M2012 Hallux valgus (acquired), left foot: Secondary | ICD-10-CM | POA: Diagnosis not present

## 2014-12-28 DIAGNOSIS — M79672 Pain in left foot: Secondary | ICD-10-CM | POA: Diagnosis not present

## 2014-12-28 DIAGNOSIS — M79671 Pain in right foot: Secondary | ICD-10-CM | POA: Diagnosis not present

## 2014-12-28 DIAGNOSIS — M2011 Hallux valgus (acquired), right foot: Secondary | ICD-10-CM | POA: Diagnosis not present

## 2014-12-28 DIAGNOSIS — L603 Nail dystrophy: Secondary | ICD-10-CM | POA: Diagnosis not present

## 2014-12-28 DIAGNOSIS — E1051 Type 1 diabetes mellitus with diabetic peripheral angiopathy without gangrene: Secondary | ICD-10-CM | POA: Diagnosis not present

## 2014-12-28 DIAGNOSIS — I739 Peripheral vascular disease, unspecified: Secondary | ICD-10-CM | POA: Diagnosis not present

## 2015-01-04 DIAGNOSIS — E119 Type 2 diabetes mellitus without complications: Secondary | ICD-10-CM | POA: Diagnosis not present

## 2015-01-04 DIAGNOSIS — I10 Essential (primary) hypertension: Secondary | ICD-10-CM | POA: Diagnosis not present

## 2015-01-04 DIAGNOSIS — F17200 Nicotine dependence, unspecified, uncomplicated: Secondary | ICD-10-CM | POA: Diagnosis not present

## 2015-01-04 DIAGNOSIS — F419 Anxiety disorder, unspecified: Secondary | ICD-10-CM | POA: Diagnosis not present

## 2015-01-04 DIAGNOSIS — M2042 Other hammer toe(s) (acquired), left foot: Secondary | ICD-10-CM | POA: Diagnosis not present

## 2015-01-04 DIAGNOSIS — M40209 Unspecified kyphosis, site unspecified: Secondary | ICD-10-CM | POA: Diagnosis not present

## 2015-01-04 DIAGNOSIS — I2510 Atherosclerotic heart disease of native coronary artery without angina pectoris: Secondary | ICD-10-CM | POA: Diagnosis not present

## 2015-01-04 DIAGNOSIS — R600 Localized edema: Secondary | ICD-10-CM | POA: Diagnosis not present

## 2015-01-04 DIAGNOSIS — J449 Chronic obstructive pulmonary disease, unspecified: Secondary | ICD-10-CM | POA: Diagnosis not present

## 2015-01-11 DIAGNOSIS — M2011 Hallux valgus (acquired), right foot: Secondary | ICD-10-CM | POA: Diagnosis not present

## 2015-01-16 DIAGNOSIS — M5137 Other intervertebral disc degeneration, lumbosacral region: Secondary | ICD-10-CM | POA: Diagnosis not present

## 2015-01-16 DIAGNOSIS — M4184 Other forms of scoliosis, thoracic region: Secondary | ICD-10-CM | POA: Diagnosis not present

## 2015-01-16 DIAGNOSIS — M545 Low back pain: Secondary | ICD-10-CM | POA: Diagnosis not present

## 2015-01-16 DIAGNOSIS — M546 Pain in thoracic spine: Secondary | ICD-10-CM | POA: Diagnosis not present

## 2015-01-23 ENCOUNTER — Ambulatory Visit (INDEPENDENT_AMBULATORY_CARE_PROVIDER_SITE_OTHER): Payer: Medicare Other | Admitting: Cardiology

## 2015-01-23 ENCOUNTER — Encounter: Payer: Self-pay | Admitting: Cardiology

## 2015-01-23 VITALS — BP 138/68 | HR 66 | Ht 62.0 in | Wt 138.3 lb

## 2015-01-23 DIAGNOSIS — I2510 Atherosclerotic heart disease of native coronary artery without angina pectoris: Secondary | ICD-10-CM

## 2015-01-23 DIAGNOSIS — I10 Essential (primary) hypertension: Secondary | ICD-10-CM | POA: Diagnosis not present

## 2015-01-23 DIAGNOSIS — J449 Chronic obstructive pulmonary disease, unspecified: Secondary | ICD-10-CM

## 2015-01-23 DIAGNOSIS — I480 Paroxysmal atrial fibrillation: Secondary | ICD-10-CM | POA: Diagnosis not present

## 2015-01-23 NOTE — Patient Instructions (Signed)
Continue your current therapy  I encourage you to quit smoking  I will see you in 6 months.

## 2015-01-23 NOTE — Progress Notes (Signed)
01/23/2015   PCP: Anthoney Harada, MD   Chief Complaint  Patient presents with  . Follow-up    took 2 nitro5/8/16 arm pain,bilateral leg swelling    Primary Cardiologist:Dr. P. Martinique    HPI:  69 yo WF with a history of coronary disease and has had multiple angioplasty procedures of the first obtuse marginal vessel. She is status post stenting of the right coronary using a 3.0 x 18 mm Tetra stent in 2000. Her last cardiac catheterization in July of 2010 showed nonobstructive disease. Her myoview study in 07/2014 was normal. She has a history of tobacco and Etoh abuse. She has chronic depression. Her last Echo 08/2014 with EF 60-65% moderate MR, PA peak pressure 39 mm Hg overall poor quality.  Her major complaint in Oct.was of swelling in her legs. This began following complex bilateral hip surgery in 2012. It had progressed this past year. She is on lasix  daily. She wears support hose. She has worn Una boots and had ACE wraps as well with some improvement. She has chronic SOB.   She was hospitalized  in Dec. 2015 for sepsis due to PNA and UTI.  During this acute episode she had rapid atrial fib and mild elevation of troponin at 1.20.  Prior to discharge she was in SR, it was felt no anticoagulation was warranted for brief episode of PAF with acute illness.  Her troponin elevation was felt to be due to demand ischemia.   Since that time she has done much better. She reports complete abstinence from Etoh. She lost her job and her stress level is lower. She is still smoking. Recently her daughter passed away from illness related to drug abuse. Jeani Hawking is still depressed from all this. Notes " blank spaces" in her memory from when she was septic. Edema is doing pretty well now. Occasional aching in her arms unrelated to activity.   Allergies  Allergen Reactions  . Penicillins   . Zithromax [Azithromycin Dihydrate]     ORAL ULCERS     Current Outpatient Prescriptions    Medication Sig Dispense Refill  . albuterol (PROVENTIL HFA;VENTOLIN HFA) 108 (90 BASE) MCG/ACT inhaler Inhale 2-4 puffs into the lungs every 6 (six) hours as needed for wheezing (wheezing).     Marland Kitchen albuterol (PROVENTIL) (2.5 MG/3ML) 0.083% nebulizer solution Take 2.5 mg by nebulization every 6 (six) hours as needed.      . ALPRAZolam (XANAX) 0.25 MG tablet Take one tablet daily or as needed 90 tablet 3  . aspirin EC 81 MG tablet Take 1 tablet (81 mg total) by mouth daily. 30 tablet 0  . bisoprolol (ZEBETA) 10 MG tablet Take 5 mg by mouth daily.     . diclofenac (VOLTAREN) 75 MG EC tablet Take 1 tablet by mouth 2 (two) times daily as needed for mild pain or moderate pain (pain).     . furosemide (LASIX) 40 MG tablet Take 1 tablet (40 mg total) by mouth 2 (two) times daily. 180 tablet 3  . gabapentin (NEURONTIN) 600 MG tablet Take 600 mg by mouth 3 (three) times daily.     . Ipratropium Bromide HFA (ATROVENT HFA IN) Inhale 17 mcg into the lungs at bedtime.     Marland Kitchen JANUVIA 25 MG tablet Take 1 tablet by mouth daily.    Marland Kitchen losartan (COZAAR) 25 MG tablet Take 25 mg by mouth daily.  1  . morphine (KADIAN) 30 MG 24 hr capsule  Take 30 mg by mouth daily.      Marland Kitchen morphine (MS CONTIN) 30 MG 12 hr tablet Take 30 mg by mouth 4 (four) times daily.   0  . nicotine (NICODERM CQ - DOSED IN MG/24 HOURS) 21 mg/24hr patch Place 1 patch onto the skin daily.      . nitroGLYCERIN (NITROSTAT) 0.4 MG SL tablet Place 1 tablet (0.4 mg total) under the tongue as needed. 90 tablet 6  . oxyCODONE (ROXICODONE) 15 MG immediate release tablet Take 15 mg by mouth every 6 (six) hours as needed.      . simvastatin (ZOCOR) 10 MG tablet Take 10 mg by mouth every evening.     No current facility-administered medications for this visit.    Past Medical History  Diagnosis Date  . Myocardial infarction 2000  . Diabetes mellitus type 2, insulin dependent   . COPD (chronic obstructive pulmonary disease)   . Hyperlipidemia   . Breast  cancer   . OA (osteoarthritis) of knee   . Hypertension   . Coronary artery disease   . Asthma   . Elevated LFTs   . ETOH abuse   . Tobacco abuse   . Osteoarthritis     Past Surgical History  Procedure Laterality Date  . Breast surgery  1991    right mastectomy  . Vulvectomy  1981    partial  . Neck fusion    . Pelvic laparoscopy  2002    RSO  . Cesarean section  '78, '80, '81    x 3  . Hysteroscopy      D & C  . Cardiac catheterization  04/05/2009    EF 60%  . Lobectomy    . Tonsillectomy and adenoidectomy    . Mastectomy    . Coronary angioplasty  08/1998    x2 OF A BIFURCATION OM-1, OM-2 LESION  . Coronary angioplasty with stent placement  01/1999    MID FIRST OBTUSE MARGINAL VESSEL  . Coronary angioplasty with stent placement  07/1999    STENTING AT THE CRUX OF THE RIGHT CORONARY ARTERY WITH A 3.8MM X 18MM TETRA STENT  . Cardiovascular stress test  01/31/2005    EF 58%  . Total hip arthroplasty  08/2010    bilat    ROS:As noted in HPI. All other systems are reviewed and are negative.   Wt Readings from Last 3 Encounters:  01/23/15 138 lb 4.8 oz (62.732 kg)  09/20/14 139 lb (63.05 kg)  09/02/14 152 lb 1.9 oz (69 kg)    PHYSICAL EXAM BP 138/68 mmHg  Pulse 66  Ht 5\' 2"  (1.575 m)  Wt 138 lb 4.8 oz (62.732 kg)  BMI 25.29 kg/m2 General:Pleasant affect, NAD Skin:Warm and dry, brisk capillary refill HEENT:normocephalic, sclera clear, mucus membranes moist Neck:supple, no JVD, no bruits  Heart:S1S2 RRR without murmur, gallup, rub or click Lungs:clear without rales, rhonchi, or wheezes WYO:VZCH, non tender, + BS, do not palpate liver spleen or masses Ext:1+ lower ext edema mid calf down  2+ radial pulses  Neuro:alert and oriented X 3, MAE, follows commands, + facial symmetry   ASSESSMENT AND PLAN 1. Lower extremity edema. Stable on current lasix dose. Will continue. Recommend sodium restriction. Wear support hose.  2. CAD- normal myoview in October 2015. No  active angina. Continue medical therapy  3. HTN well controlled  4. Paroxysmal Afib- only noted during episode of sepsis. Will follow for recurrence. Would not commit to long term anticoagulation at this time.  5. Tobacco abuse. Encourage smoking cessation.  6. Etoh abuse. Encourage continued abstinence.   7. Depression  8. COPD  9. Hyperlipidemia

## 2015-02-08 DIAGNOSIS — D2371 Other benign neoplasm of skin of right lower limb, including hip: Secondary | ICD-10-CM | POA: Diagnosis not present

## 2015-03-06 DIAGNOSIS — J449 Chronic obstructive pulmonary disease, unspecified: Secondary | ICD-10-CM | POA: Diagnosis not present

## 2015-03-06 DIAGNOSIS — R600 Localized edema: Secondary | ICD-10-CM | POA: Diagnosis not present

## 2015-03-06 DIAGNOSIS — I2510 Atherosclerotic heart disease of native coronary artery without angina pectoris: Secondary | ICD-10-CM | POA: Diagnosis not present

## 2015-03-06 DIAGNOSIS — M40209 Unspecified kyphosis, site unspecified: Secondary | ICD-10-CM | POA: Diagnosis not present

## 2015-03-06 DIAGNOSIS — E119 Type 2 diabetes mellitus without complications: Secondary | ICD-10-CM | POA: Diagnosis not present

## 2015-03-06 DIAGNOSIS — I10 Essential (primary) hypertension: Secondary | ICD-10-CM | POA: Diagnosis not present

## 2015-03-06 DIAGNOSIS — J209 Acute bronchitis, unspecified: Secondary | ICD-10-CM | POA: Diagnosis not present

## 2015-03-06 DIAGNOSIS — R0989 Other specified symptoms and signs involving the circulatory and respiratory systems: Secondary | ICD-10-CM | POA: Diagnosis not present

## 2015-03-06 DIAGNOSIS — M542 Cervicalgia: Secondary | ICD-10-CM | POA: Diagnosis not present

## 2015-03-06 DIAGNOSIS — Z794 Long term (current) use of insulin: Secondary | ICD-10-CM | POA: Diagnosis not present

## 2015-03-08 DIAGNOSIS — L603 Nail dystrophy: Secondary | ICD-10-CM | POA: Diagnosis not present

## 2015-03-08 DIAGNOSIS — E1051 Type 1 diabetes mellitus with diabetic peripheral angiopathy without gangrene: Secondary | ICD-10-CM | POA: Diagnosis not present

## 2015-03-08 DIAGNOSIS — L84 Corns and callosities: Secondary | ICD-10-CM | POA: Diagnosis not present

## 2015-03-08 DIAGNOSIS — I739 Peripheral vascular disease, unspecified: Secondary | ICD-10-CM | POA: Diagnosis not present

## 2015-03-09 DIAGNOSIS — G894 Chronic pain syndrome: Secondary | ICD-10-CM | POA: Diagnosis not present

## 2015-03-09 DIAGNOSIS — M5136 Other intervertebral disc degeneration, lumbar region: Secondary | ICD-10-CM | POA: Diagnosis not present

## 2015-03-09 DIAGNOSIS — M546 Pain in thoracic spine: Secondary | ICD-10-CM | POA: Diagnosis not present

## 2015-03-09 DIAGNOSIS — M542 Cervicalgia: Secondary | ICD-10-CM | POA: Diagnosis not present

## 2015-03-12 ENCOUNTER — Other Ambulatory Visit: Payer: Self-pay

## 2015-03-13 ENCOUNTER — Emergency Department (HOSPITAL_COMMUNITY): Payer: Medicare Other

## 2015-03-13 ENCOUNTER — Encounter (HOSPITAL_COMMUNITY): Payer: Self-pay | Admitting: Emergency Medicine

## 2015-03-13 ENCOUNTER — Emergency Department (HOSPITAL_COMMUNITY)
Admission: EM | Admit: 2015-03-13 | Discharge: 2015-03-13 | Disposition: A | Discharge status: Home / Self Care | Payer: Medicare Other | Attending: Emergency Medicine | Admitting: Emergency Medicine

## 2015-03-13 DIAGNOSIS — M25551 Pain in right hip: Secondary | ICD-10-CM | POA: Diagnosis not present

## 2015-03-13 DIAGNOSIS — S73004A Unspecified dislocation of right hip, initial encounter: Secondary | ICD-10-CM

## 2015-03-13 DIAGNOSIS — Z9861 Coronary angioplasty status: Secondary | ICD-10-CM | POA: Diagnosis not present

## 2015-03-13 DIAGNOSIS — Z79899 Other long term (current) drug therapy: Secondary | ICD-10-CM | POA: Insufficient documentation

## 2015-03-13 DIAGNOSIS — I252 Old myocardial infarction: Secondary | ICD-10-CM | POA: Insufficient documentation

## 2015-03-13 DIAGNOSIS — J449 Chronic obstructive pulmonary disease, unspecified: Secondary | ICD-10-CM | POA: Diagnosis not present

## 2015-03-13 DIAGNOSIS — Z720 Tobacco use: Secondary | ICD-10-CM | POA: Diagnosis not present

## 2015-03-13 DIAGNOSIS — E785 Hyperlipidemia, unspecified: Secondary | ICD-10-CM | POA: Diagnosis not present

## 2015-03-13 DIAGNOSIS — Z7982 Long term (current) use of aspirin: Secondary | ICD-10-CM | POA: Diagnosis not present

## 2015-03-13 DIAGNOSIS — T84020A Dislocation of internal right hip prosthesis, initial encounter: Secondary | ICD-10-CM | POA: Diagnosis present

## 2015-03-13 DIAGNOSIS — M179 Osteoarthritis of knee, unspecified: Secondary | ICD-10-CM | POA: Diagnosis not present

## 2015-03-13 DIAGNOSIS — Z96641 Presence of right artificial hip joint: Secondary | ICD-10-CM | POA: Diagnosis not present

## 2015-03-13 DIAGNOSIS — I2510 Atherosclerotic heart disease of native coronary artery without angina pectoris: Secondary | ICD-10-CM | POA: Diagnosis not present

## 2015-03-13 DIAGNOSIS — Z880 Allergy status to penicillin: Secondary | ICD-10-CM | POA: Diagnosis not present

## 2015-03-13 DIAGNOSIS — T148 Other injury of unspecified body region: Secondary | ICD-10-CM | POA: Diagnosis not present

## 2015-03-13 DIAGNOSIS — Y831 Surgical operation with implant of artificial internal device as the cause of abnormal reaction of the patient, or of later complication, without mention of misadventure at the time of the procedure: Secondary | ICD-10-CM | POA: Diagnosis not present

## 2015-03-13 DIAGNOSIS — W19XXXA Unspecified fall, initial encounter: Secondary | ICD-10-CM

## 2015-03-13 DIAGNOSIS — Z853 Personal history of malignant neoplasm of breast: Secondary | ICD-10-CM | POA: Insufficient documentation

## 2015-03-13 DIAGNOSIS — T84020D Dislocation of internal right hip prosthesis, subsequent encounter: Secondary | ICD-10-CM | POA: Diagnosis not present

## 2015-03-13 DIAGNOSIS — I10 Essential (primary) hypertension: Secondary | ICD-10-CM | POA: Insufficient documentation

## 2015-03-13 MED ORDER — PROPOFOL 10 MG/ML IV BOLUS
0.5000 mg/kg | Freq: Once | INTRAVENOUS | Status: AC
  Administered 2015-03-13: 80 mg via INTRAVENOUS
  Filled 2015-03-13: qty 1

## 2015-03-13 MED ORDER — FENTANYL CITRATE (PF) 100 MCG/2ML IJ SOLN
50.0000 ug | INTRAMUSCULAR | Status: DC | PRN
  Administered 2015-03-13: 50 ug via INTRAVENOUS
  Filled 2015-03-13: qty 2

## 2015-03-13 MED ORDER — ONDANSETRON HCL 4 MG/2ML IJ SOLN
4.0000 mg | Freq: Once | INTRAMUSCULAR | Status: AC
  Administered 2015-03-13: 4 mg via INTRAVENOUS
  Filled 2015-03-13: qty 2

## 2015-03-13 NOTE — Discharge Instructions (Signed)
Try to avoid flexing your hip.   Follow up with Dr. Alvan Dame.

## 2015-03-13 NOTE — ED Notes (Signed)
Per EMS: pt has right hip deformity, pt has artificial hip(head of femur), has previous dislocation due to size.

## 2015-03-13 NOTE — ED Provider Notes (Signed)
CSN: [id number]     Arrival date & time 03/13/15  1233 History   First MD Initiated Contact with Patient 03/13/15 1257     Chief Complaint  Patient presents with  . 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 She is happened to her twice before. Last time was 2 years ago. States she was using a manicure device to remove skin off of her foot. She her hip flexed up onto a table. When she went to put it down it would not go when she felt pain. She presents here with her hip flexed internally rotated and shortened consistent with a posterior hip dislocation.  Past Medical History  Diagnosis Date  . Myocardial infarction 2000  . Diabetes mellitus type 2, insulin dependent   . COPD (chronic obstructive pulmonary disease)   . Hyperlipidemia   . Breast cancer   . OA (osteoarthritis) of knee   . Hypertension   . Coronary artery disease   . Asthma   . Elevated LFTs   . ETOH abuse   . Tobacco abuse   . Osteoarthritis    Past Surgical History  Procedure Laterality Date  . Breast surgery  1991    right mastectomy  . Vulvectomy  1981    partial  . Neck fusion    . Pelvic laparoscopy  2002    RSO  . Cesarean section  '78, '80, '81    x 3  . Hysteroscopy      D & C  . Cardiac catheterization  04/05/2009    EF 60%  . Lobectomy    . Tonsillectomy and adenoidectomy    . Mastectomy    . Coronary angioplasty  08/1998    x2 OF A BIFURCATION OM-1, OM-2 LESION  . Coronary angioplasty with stent placement  01/1999    MID FIRST OBTUSE MARGINAL VESSEL  . Coronary angioplasty with stent placement  07/1999    STENTING AT THE CRUX OF THE RIGHT CORONARY ARTERY WITH A 3.8MM X 18MM TETRA STENT  . Cardiovascular stress test  01/31/2005    EF 58%  . Total hip arthroplasty  08/2010    bilat   Family History  Problem Relation Age of Onset  . Diabetes Mother   . Hypertension Father   . Heart disease Father   . Heart attack Father   . Stroke Father     History  Substance Use Topics  . Smoking status: Current Every Day Smoker -- 1.00 packs/day for 15 years    Types: Cigarettes  . Smokeless tobacco: Never Used  . Alcohol Use: Yes     Comment: 1 gallon/week   OB History    No data available     Review of Systems  Constitutional: Negative for fever, chills, diaphoresis, appetite change and fatigue.  HENT: Negative for mouth sores, sore throat and trouble swallowing.   Eyes: Negative for visual disturbance.  Respiratory: Negative for cough, chest tightness, shortness of breath and wheezing.   Cardiovascular: Negative for chest pain.  Gastrointestinal: Negative for nausea, vomiting, abdominal pain, diarrhea and abdominal distention.  Endocrine: Negative for polydipsia, polyphagia and polyuria.  Genitourinary: Negative for dysuria, frequency and hematuria.  Musculoskeletal: Negative for gait problem.       Right hip pain.  Skin: Negative for color change, pallor and rash.  Neurological: Negative for dizziness, syncope, light-headedness and headaches.  Hematological: Does not bruise/bleed easily.  Psychiatric/Behavioral: Negative for behavioral  problems and confusion.      Allergies  Penicillins and Zithromax  Home Medications   Prior to Admission medications   Medication Sig Start Date End Date Taking? Authorizing Provider  albuterol (PROVENTIL HFA;VENTOLIN HFA) 108 (90 BASE) MCG/ACT inhaler Inhale 2-4 puffs into the lungs every 6 (six) hours as needed for wheezing (wheezing).     Historical Provider, MD  albuterol (PROVENTIL) (2.5 MG/3ML) 0.083% nebulizer solution Take 2.5 mg by nebulization every 6 (six) hours as needed.      Historical Provider, MD  ALPRAZolam Duanne Moron) 0.25 MG tablet Take one tablet daily or as needed 05/14/11   Peter M Martinique, MD  aspirin EC 81 MG tablet Take 1 tablet (81 mg total) by mouth daily. 09/02/14   Orson Eva, MD  bisoprolol (ZEBETA) 10 MG tablet Take 5 mg by mouth daily.  08/25/14   Historical  Provider, MD  diclofenac (VOLTAREN) 75 MG EC tablet Take 1 tablet by mouth 2 (two) times daily as needed for mild pain or moderate pain (pain).  06/26/14   Historical Provider, MD  furosemide (LASIX) 40 MG tablet Take 1 tablet (40 mg total) by mouth 2 (two) times daily. 07/12/14   Peter M Martinique, MD  gabapentin (NEURONTIN) 600 MG tablet Take 600 mg by mouth 3 (three) times daily.     Historical Provider, MD  Ipratropium Bromide HFA (ATROVENT HFA IN) Inhale 17 mcg into the lungs at bedtime.     Historical Provider, MD  JANUVIA 25 MG tablet Take 1 tablet by mouth daily. 06/12/14   Historical Provider, MD  losartan (COZAAR) 25 MG tablet Take 25 mg by mouth daily. 08/09/14   Historical Provider, MD  morphine (KADIAN) 30 MG 24 hr capsule Take 30 mg by mouth daily.      Historical Provider, MD  morphine (MS CONTIN) 30 MG 12 hr tablet Take 30 mg by mouth 4 (four) times daily.  06/26/14   Historical Provider, MD  nicotine (NICODERM CQ - DOSED IN MG/24 HOURS) 21 mg/24hr patch Place 1 patch onto the skin daily.      Historical Provider, MD  nitroGLYCERIN (NITROSTAT) 0.4 MG SL tablet Place 1 tablet (0.4 mg total) under the tongue as needed. 07/20/14   Peter M Martinique, MD  oxyCODONE (ROXICODONE) 15 MG immediate release tablet Take 15 mg by mouth every 6 (six) hours as needed.      Historical Provider, MD  simvastatin (ZOCOR) 10 MG tablet Take 10 mg by mouth every evening.    Historical Provider, MD   BP 109/55 mmHg  Pulse 75  Temp(Src) 98.4 F (36.9 C) (Oral)  Resp 13  Wt 138 lb 9.7 oz (62.87 kg)  SpO2 96% Physical Exam  Constitutional: She is oriented to person, place, and time. She appears well-developed and well-nourished. No distress.  HENT:  Head: Normocephalic.  Eyes: Conjunctivae are normal. Pupils are equal, round, and reactive to light. No scleral icterus.  Neck: Normal range of motion. Neck supple. No thyromegaly present.  Cardiovascular: Normal rate and regular rhythm.  Exam reveals no gallop  and no friction rub.   No murmur heard. Pulmonary/Chest: Effort normal and breath sounds normal. No respiratory distress. She has no wheezes. She has no rales.  Abdominal: Soft. Bowel sounds are normal. She exhibits no distension. There is no tenderness. There is no rebound.  Musculoskeletal: Normal range of motion.       Legs: Neurological: She is alert and oriented to person, place, and time.  Skin: Skin  is warm and dry. No rash noted.  Psychiatric: She has a normal mood and affect. Her behavior is normal.    ED Course  Procedures (including critical care time) Labs Review Labs Reviewed - No data to display  Imaging Review Dg Hip Port Unilat With Pelvis 1v Right  03/13/2015   CLINICAL DATA:  Reduction of total hip prosthesis dislocation on right  EXAM: RIGHT HIP  1 VIEW PORTABLE  COMPARISON:  Study obtained earlier in the day  FINDINGS: Frontal right hip image obtained. The prosthetic components of the right total hip prosthesis appear well seated on this single view. The dislocation appears to have been reduced successfully. No fracture or dislocation apparent on this single view.  IMPRESSION: Apparent reduction of total hip prosthesis dislocation with prosthesis appearing intact on this single view. No acute fracture or dislocation apparent currently.   Electronically Signed   By: Lowella Grip III M.D.   On: 03/13/2015 14:49   Dg Hip Unilat  With Pelvis 2-3 Views Right  03/13/2015   CLINICAL DATA:  Right hip injury.  Pain.  Prior dislocations.  EXAM: RIGHT HIP (WITH PELVIS) 2-3 VIEWS  COMPARISON:  1. 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  The femoral head component of the right total hip prosthesis is proximally dislocated 5.5 cm with respect to the acetabular shell.  I do not observe an associated fracture.  IMPRESSION: 1. Dislocated right total hip prosthesis, with femoral head component 5.5 cm proximal to the acetabular shell.   Electronically Signed   By:  Van Clines M.D.   On: 03/13/2015 13:50     EKG Interpretation None      MDM   Final diagnoses:  Fall  Hip dislocation, right    Procedural sedation Performed by: Lolita Patella Consent: Verbal consent obtained. Risks and benefits: risks, benefits and alternatives were discussed Required items: required blood products, implants, devices, and special equipment available Patient identity confirmed: arm band and provided demographic data Time out: Immediately prior to procedure a "time out" was called to verify the correct patient, procedure, equipment, support staff and site/side marked as required.  Sedation type: moderate (conscious) sedation NPO time confirmed and considedered  Sedatives: PROPOFOL  Physician Time at Bedside: 20  Vitals: Vital signs were monitored during sedation. Cardiac Monitor, pulse oximeter Patient tolerance: Patient tolerated the procedure well with no immediate complications. Comments: Pt with uneventful recovered. Returned to pre-procedural sedation baseline  Reduction of dislocation Date/Time: 3:02 PM Performed by: Lolita Patella Authorized by: Lolita Patella Consent: Verbal consent obtained. Risks and benefits: risks, benefits and alternatives were discussed Consent given by: patient Required items: required blood products, implants, devices, and special equipment available Time out: Immediately prior to procedure a "time out" was called to verify the correct patient, procedure, equipment, support staff and site/side marked as required.  Patient sedated:   Vitals: Vital signs were monitored during sedation. Patient tolerance: Patient tolerated the procedure well with no immediate complications. Joint: Right hip Reduction technique: Flexion, adduction.     Patient awakens quickly from sedation. Actually is ambulated in the emergency room. Plan is discharged home. Follow with her orthopedic surgeon Dr. Alvan Dame.    Tanna Furry, MD 03/13/15 [phone]

## 2015-03-16 DIAGNOSIS — S73004A Unspecified dislocation of right hip, initial encounter: Secondary | ICD-10-CM | POA: Diagnosis not present

## 2015-03-16 DIAGNOSIS — M25551 Pain in right hip: Secondary | ICD-10-CM | POA: Diagnosis not present

## 2015-03-16 DIAGNOSIS — Z96641 Presence of right artificial hip joint: Secondary | ICD-10-CM | POA: Diagnosis not present

## 2015-03-20 DIAGNOSIS — F17200 Nicotine dependence, unspecified, uncomplicated: Secondary | ICD-10-CM | POA: Diagnosis not present

## 2015-03-20 DIAGNOSIS — J449 Chronic obstructive pulmonary disease, unspecified: Secondary | ICD-10-CM | POA: Diagnosis not present

## 2015-03-20 DIAGNOSIS — I2510 Atherosclerotic heart disease of native coronary artery without angina pectoris: Secondary | ICD-10-CM | POA: Diagnosis not present

## 2015-03-20 DIAGNOSIS — E119 Type 2 diabetes mellitus without complications: Secondary | ICD-10-CM | POA: Diagnosis not present

## 2015-03-20 DIAGNOSIS — Z794 Long term (current) use of insulin: Secondary | ICD-10-CM | POA: Diagnosis not present

## 2015-03-20 DIAGNOSIS — J189 Pneumonia, unspecified organism: Secondary | ICD-10-CM | POA: Diagnosis not present

## 2015-03-20 DIAGNOSIS — M542 Cervicalgia: Secondary | ICD-10-CM | POA: Diagnosis not present

## 2015-03-20 DIAGNOSIS — I10 Essential (primary) hypertension: Secondary | ICD-10-CM | POA: Diagnosis not present

## 2015-03-21 ENCOUNTER — Ambulatory Visit
Admission: RE | Admit: 2015-03-21 | Discharge: 2015-03-21 | Disposition: A | Discharge status: Home / Self Care | Payer: Medicare Other | Source: Ambulatory Visit | Attending: Family Medicine | Admitting: Family Medicine

## 2015-03-21 ENCOUNTER — Other Ambulatory Visit: Payer: Self-pay | Admitting: Family Medicine

## 2015-03-21 DIAGNOSIS — J189 Pneumonia, unspecified organism: Secondary | ICD-10-CM | POA: Diagnosis not present

## 2015-03-21 DIAGNOSIS — R059 Cough, unspecified: Secondary | ICD-10-CM

## 2015-03-21 DIAGNOSIS — R05 Cough: Secondary | ICD-10-CM

## 2015-03-26 ENCOUNTER — Emergency Department (HOSPITAL_COMMUNITY): Payer: Medicare Other

## 2015-03-26 ENCOUNTER — Emergency Department (HOSPITAL_COMMUNITY)
Admission: EM | Admit: 2015-03-26 | Discharge: 2015-03-26 | Disposition: A | Discharge status: Home / Self Care | Payer: Medicare Other | Attending: Emergency Medicine | Admitting: Emergency Medicine

## 2015-03-26 ENCOUNTER — Encounter (HOSPITAL_COMMUNITY): Payer: Self-pay | Admitting: Family Medicine

## 2015-03-26 DIAGNOSIS — Z792 Long term (current) use of antibiotics: Secondary | ICD-10-CM | POA: Diagnosis not present

## 2015-03-26 DIAGNOSIS — Y831 Surgical operation with implant of artificial internal device as the cause of abnormal reaction of the patient, or of later complication, without mention of misadventure at the time of the procedure: Secondary | ICD-10-CM | POA: Insufficient documentation

## 2015-03-26 DIAGNOSIS — M1710 Unilateral primary osteoarthritis, unspecified knee: Secondary | ICD-10-CM | POA: Diagnosis not present

## 2015-03-26 DIAGNOSIS — Z853 Personal history of malignant neoplasm of breast: Secondary | ICD-10-CM | POA: Diagnosis not present

## 2015-03-26 DIAGNOSIS — Y9289 Other specified places as the place of occurrence of the external cause: Secondary | ICD-10-CM | POA: Insufficient documentation

## 2015-03-26 DIAGNOSIS — E119 Type 2 diabetes mellitus without complications: Secondary | ICD-10-CM | POA: Insufficient documentation

## 2015-03-26 DIAGNOSIS — S79911A Unspecified injury of right hip, initial encounter: Secondary | ICD-10-CM | POA: Diagnosis present

## 2015-03-26 DIAGNOSIS — Z880 Allergy status to penicillin: Secondary | ICD-10-CM | POA: Insufficient documentation

## 2015-03-26 DIAGNOSIS — T84020A Dislocation of internal right hip prosthesis, initial encounter: Secondary | ICD-10-CM | POA: Diagnosis not present

## 2015-03-26 DIAGNOSIS — Y9389 Activity, other specified: Secondary | ICD-10-CM | POA: Diagnosis not present

## 2015-03-26 DIAGNOSIS — Y998 Other external cause status: Secondary | ICD-10-CM | POA: Diagnosis not present

## 2015-03-26 DIAGNOSIS — Z79899 Other long term (current) drug therapy: Secondary | ICD-10-CM | POA: Insufficient documentation

## 2015-03-26 DIAGNOSIS — J449 Chronic obstructive pulmonary disease, unspecified: Secondary | ICD-10-CM | POA: Diagnosis not present

## 2015-03-26 DIAGNOSIS — S73004A Unspecified dislocation of right hip, initial encounter: Secondary | ICD-10-CM

## 2015-03-26 DIAGNOSIS — Z96641 Presence of right artificial hip joint: Secondary | ICD-10-CM | POA: Diagnosis not present

## 2015-03-26 DIAGNOSIS — Z720 Tobacco use: Secondary | ICD-10-CM | POA: Diagnosis not present

## 2015-03-26 DIAGNOSIS — I10 Essential (primary) hypertension: Secondary | ICD-10-CM | POA: Diagnosis not present

## 2015-03-26 DIAGNOSIS — I252 Old myocardial infarction: Secondary | ICD-10-CM | POA: Diagnosis not present

## 2015-03-26 DIAGNOSIS — Z9861 Coronary angioplasty status: Secondary | ICD-10-CM | POA: Diagnosis not present

## 2015-03-26 DIAGNOSIS — I2510 Atherosclerotic heart disease of native coronary artery without angina pectoris: Secondary | ICD-10-CM | POA: Diagnosis not present

## 2015-03-26 DIAGNOSIS — T148 Other injury of unspecified body region: Secondary | ICD-10-CM | POA: Diagnosis not present

## 2015-03-26 DIAGNOSIS — X58XXXA Exposure to other specified factors, initial encounter: Secondary | ICD-10-CM | POA: Diagnosis not present

## 2015-03-26 DIAGNOSIS — Z9889 Other specified postprocedural states: Secondary | ICD-10-CM | POA: Diagnosis not present

## 2015-03-26 DIAGNOSIS — Z7982 Long term (current) use of aspirin: Secondary | ICD-10-CM | POA: Insufficient documentation

## 2015-03-26 DIAGNOSIS — M25551 Pain in right hip: Secondary | ICD-10-CM | POA: Diagnosis not present

## 2015-03-26 DIAGNOSIS — E785 Hyperlipidemia, unspecified: Secondary | ICD-10-CM | POA: Diagnosis not present

## 2015-03-26 DIAGNOSIS — Z471 Aftercare following joint replacement surgery: Secondary | ICD-10-CM | POA: Diagnosis not present

## 2015-03-26 DIAGNOSIS — Z96642 Presence of left artificial hip joint: Secondary | ICD-10-CM | POA: Diagnosis not present

## 2015-03-26 LAB — CBC WITH DIFFERENTIAL/PLATELET
BASOS PCT: 1 % (ref 0–1)
Basophils Absolute: 0.1 10*3/uL (ref 0.0–0.1)
EOS PCT: 3 % (ref 0–5)
Eosinophils Absolute: 0.3 10*3/uL (ref 0.0–0.7)
HCT: 32.1 % — ABNORMAL LOW (ref 36.0–46.0)
Hemoglobin: 11.1 g/dL — ABNORMAL LOW (ref 12.0–15.0)
LYMPHS ABS: 1.7 10*3/uL (ref 0.7–4.0)
Lymphocytes Relative: 18 % (ref 12–46)
MCH: 28.4 pg (ref 26.0–34.0)
MCHC: 34.6 g/dL (ref 30.0–36.0)
MCV: 82.1 fL (ref 78.0–100.0)
MONO ABS: 1.1 10*3/uL — AB (ref 0.1–1.0)
MONOS PCT: 11 % (ref 3–12)
NEUTROS ABS: 6.5 10*3/uL (ref 1.7–7.7)
Neutrophils Relative %: 67 % (ref 43–77)
PLATELETS: 355 10*3/uL (ref 150–400)
RBC: 3.91 MIL/uL (ref 3.87–5.11)
RDW: 16 % — ABNORMAL HIGH (ref 11.5–15.5)
WBC: 9.7 10*3/uL (ref 4.0–10.5)

## 2015-03-26 LAB — BASIC METABOLIC PANEL
Anion gap: 9 (ref 5–15)
BUN: 16 mg/dL (ref 6–20)
CO2: 27 mmol/L (ref 22–32)
Calcium: 8.6 mg/dL — ABNORMAL LOW (ref 8.9–10.3)
Chloride: 93 mmol/L — ABNORMAL LOW (ref 101–111)
Creatinine, Ser: 0.73 mg/dL (ref 0.44–1.00)
GFR calc Af Amer: >60 mL/min (ref >60)
Glucose, Bld: 105 mg/dL — ABNORMAL HIGH (ref 65–99)
POTASSIUM: 4 mmol/L (ref 3.5–5.1)
SODIUM: 129 mmol/L — AB (ref 135–145)

## 2015-03-26 MED ORDER — PROPOFOL 10 MG/ML IV BOLUS
30.0000 mg | INTRAVENOUS | Status: AC | PRN
  Administered 2015-03-26 (×3): 30 mg via INTRAVENOUS
  Filled 2015-03-26: qty 1

## 2015-03-26 MED ORDER — HYDROMORPHONE HCL 1 MG/ML IJ SOLN
0.5000 mg | INTRAMUSCULAR | Status: DC | PRN
  Administered 2015-03-26: 0.5 mg via INTRAVENOUS
  Filled 2015-03-26: qty 1

## 2015-03-26 NOTE — ED Notes (Signed)
Patient transported to X-ray 

## 2015-03-26 NOTE — Discharge Instructions (Signed)
Hip Dislocation A hip dislocation happens when your thigh bone (the ball) separates from your hip bone (the socket). Hip dislocation is an emergency. If you think you have a hip dislocation and cannot move your leg, get help right away. Do not try to move.  Your doctor will put your thigh bone back in the joint (the ball back in the socket). Sometimes this can be done without surgery. Surgery may be needed if blood vessels or nerves are damaged, or if the ball cannot be put back into the socket by hand. HOME CARE  Rest your injured joint. Do not move it.  Avoid the activity that caused your injury.  Put ice on your injured joint for 1 to 2 days or as told by your doctor.  Put ice in a plastic bag.  Place a towel between your skin and the bag.  Leave the ice on for 15 to 20 minutes, every 2 hours while you are awake.  Use crutches or a walker as told by your doctor.  Exercise your hip and leg as told by your doctor.  Only take medicines as told by your doctor. GET HELP RIGHT AWAY IF:  Your pain gets worse, not better.  You feel like your hip has dislocated again. MAKE SURE YOU:  Understand these instructions.  Will watch your condition.  Will get help right away if you are not doing well or get worse. Document Released: 04/30/2011 Document Revised: 11/24/2011 Document Reviewed: 04/30/2011 [facility] Patient Information 2015 [place], [place]. This information is not intended to replace advice given to you by your health care provider. Make sure you discuss any questions you have with your health care provider.

## 2015-03-26 NOTE — ED Notes (Signed)
Patient is from home and transported via [facility]. Pt has a history of bilateral hip replacements. Pt was in her bathroom, turned, and felt her right hip "pop" out. Per EMS, patients right extremity is internally rotated with great distal pulses. Pt has been given FENTANYL 250 mcq via IV en route for pain.

## 2015-03-26 NOTE — ED Provider Notes (Signed)
CSN: [id number]     Arrival date & time 03/26/15  2024 History   First MD Initiated Contact with Patient 03/26/15 2029     Chief Complaint  Patient presents with  . Hip Pain   Patient is a 69 y.o. female presenting with hip pain. The history is provided by the patient.  Hip Pain This is a recurrent problem. The current episode started less than 1 hour ago. The problem occurs constantly. The problem has not changed since onset.Pertinent negatives include no abdominal pain, no headaches and no shortness of breath. Exacerbated by: any movement. Nothing relieves the symptoms.   patient has a history of recurrent hip dislocation. Patient was actually seen in the emergency room on June 28 for a similar episode. She was diagnosed with a hip dislocation and had the hip reduced in the emergency department. Patient states today she was just bending over putting some cream on her leg when she felt her right hip pop out. The patient is having severe pain in her hip. She is unable to stand. EMS was called. She was given 250 g of fentanyl IV during transport to the emergency department. She continues to have pain.  Past Medical History  Diagnosis Date  . Myocardial infarction 2000  . Diabetes mellitus type 2, insulin dependent   . COPD (chronic obstructive pulmonary disease)   . Hyperlipidemia   . Breast cancer   . OA (osteoarthritis) of knee   . Hypertension   . Coronary artery disease   . Asthma   . Elevated LFTs   . ETOH abuse   . Tobacco abuse   . Osteoarthritis    Past Surgical History  Procedure Laterality Date  . Breast surgery  1991    right mastectomy  . Vulvectomy  1981    partial  . Neck fusion    . Pelvic laparoscopy  2002    RSO  . Cesarean section  '78, '80, '81    x 3  . Hysteroscopy      D & C  . Cardiac catheterization  04/05/2009    EF 60%  . Lobectomy    . Tonsillectomy and adenoidectomy    . Mastectomy    . Coronary angioplasty  08/1998    x2 OF A BIFURCATION OM-1,  OM-2 LESION  . Coronary angioplasty with stent placement  01/1999    MID FIRST OBTUSE MARGINAL VESSEL  . Coronary angioplasty with stent placement  07/1999    STENTING AT THE CRUX OF THE RIGHT CORONARY ARTERY WITH A 3.8MM X 18MM TETRA STENT  . Cardiovascular stress test  01/31/2005    EF 58%  . Total hip arthroplasty  08/2010    bilat   Family History  Problem Relation Age of Onset  . Diabetes Mother   . Hypertension Father   . Heart disease Father   . Heart attack Father   . Stroke Father    History  Substance Use Topics  . Smoking status: Current Every Day Smoker -- 0.75 packs/day for 15 years    Types: Cigarettes  . Smokeless tobacco: Never Used  . Alcohol Use: No   OB History    No data available     Review of Systems  Respiratory: Negative for shortness of breath.   Gastrointestinal: Negative for abdominal pain.  Neurological: Negative for headaches.  All other systems reviewed and are negative.     Allergies  Zithromax and Penicillins  Home Medications   Prior to Admission medications  Medication Sig Start Date End Date Taking? Authorizing Provider  albuterol (PROVENTIL HFA;VENTOLIN HFA) 108 (90 BASE) MCG/ACT inhaler Inhale 2-4 puffs into the lungs every 6 (six) hours as needed for wheezing (wheezing).    Yes Historical Provider, MD  albuterol (PROVENTIL) (2.5 MG/3ML) 0.083% nebulizer solution Take 2.5 mg by nebulization every 6 (six) hours as needed.     Yes Historical Provider, MD  ALPRAZolam Duanne Moron) 0.25 MG tablet Take one tablet daily or as needed 05/14/11  Yes Peter M Martinique, MD  aspirin EC 81 MG tablet Take 1 tablet (81 mg total) by mouth daily. 09/02/14  Yes Orson Eva, MD  diclofenac (VOLTAREN) 75 MG EC tablet Take 1 tablet by mouth 2 (two) times daily as needed for mild pain or moderate pain (pain).  06/26/14  Yes Historical Provider, MD  furosemide (LASIX) 40 MG tablet Take 1 tablet (40 mg total) by mouth 2 (two) times daily. Patient taking differently:  Take 40 mg by mouth daily.  07/12/14  Yes Peter M Martinique, MD  gabapentin (NEURONTIN) 600 MG tablet Take 600 mg by mouth 3 (three) times daily.    Yes Historical Provider, MD  JANUVIA 25 MG tablet Take 12.5 mg by mouth daily.  06/12/14  Yes Historical Provider, MD  levofloxacin (LEVAQUIN) 500 MG tablet Take 500 mg by mouth daily. 03/20/15  Yes Historical Provider, MD  morphine (MS CONTIN) 30 MG 12 hr tablet Take 30 mg by mouth 4 (four) times daily.  06/26/14  Yes Historical Provider, MD  nitroGLYCERIN (NITROSTAT) 0.4 MG SL tablet Place 1 tablet (0.4 mg total) under the tongue as needed. 07/20/14  Yes Peter M Martinique, MD  oxyCODONE (ROXICODONE) 15 MG immediate release tablet Take 15 mg by mouth every 6 (six) hours as needed.     Yes Historical Provider, MD  simvastatin (ZOCOR) 10 MG tablet Take 10 mg by mouth every evening.   Yes Historical Provider, MD  Ipratropium Bromide HFA (ATROVENT HFA IN) Inhale 17 mcg into the lungs at bedtime.     Historical Provider, MD   BP 113/52 mmHg  Pulse 80  Temp(Src) 98.3 F (36.8 C) (Oral)  Resp 21  Ht 5\' 2"  (1.575 m)  Wt 133 lb (60.328 kg)  BMI 24.32 kg/m2  SpO2 100% Physical Exam  Constitutional: She appears well-developed and well-nourished. No distress.  HENT:  Head: Normocephalic and atraumatic.  Right Ear: External ear normal.  Left Ear: External ear normal.  Mouth/Throat: No oropharyngeal exudate.  Eyes: Conjunctivae are normal. Right eye exhibits no discharge. Left eye exhibits no discharge. No scleral icterus.  Neck: Neck supple. No tracheal deviation present.  Cardiovascular: Normal rate, regular rhythm and normal heart sounds.   Pulmonary/Chest: Effort normal and breath sounds normal. No stridor. No respiratory distress.  Musculoskeletal: She exhibits tenderness. She exhibits no edema.       Right hip: She exhibits tenderness, bony tenderness and deformity.  Neurological: She is alert. Cranial nerve deficit: no gross deficits.  Skin: Skin is  warm and dry. No rash noted.  Psychiatric: She has a normal mood and affect.  Nursing note and vitals reviewed.   ED Course  ORTHOPEDIC INJURY TREATMENT Date/Time: 03/26/2015 10:58 PM Performed by: Dorie Rank Authorized by: Dorie Rank Consent: Verbal consent obtained. Written consent obtained. Risks and benefits: risks, benefits and alternatives were discussed Consent given by: patient Patient identity confirmed: verbally with patient Time out: Immediately prior to procedure a "time out" was called to verify the correct patient, procedure, equipment,  support staff and site/side marked as required. Injury location: upper leg Location details: right upper leg Injury type: dislocation Pre-procedure neurovascular assessment: neurovascularly intact Pre-procedure distal perfusion: normal Pre-procedure neurological function: normal Pre-procedure range of motion: reduced Patient sedated: yes Sedation type: moderate (conscious) sedation Sedatives: propofol Sedation start date/time: 06/30/2015 10:59 PM Sedation end date/time: 03/26/2015 10:45 AM Manipulation performed: yes Immobilization: knee imobilizer. Post-procedure neurovascular assessment: post-procedure neurovascularly intact Post-procedure distal perfusion: normal Post-procedure neurological function: normal Post-procedure range of motion: normal Patient tolerance: Patient tolerated the procedure well with no immediate complications Comments: Dr Mingo Amber assisted with the procedure.   (including critical care time) Labs Review Labs Reviewed  BASIC METABOLIC PANEL - Abnormal; Notable for the following:    Sodium 129 (*)    Chloride 93 (*)    Glucose, Bld 105 (*)    Calcium 8.6 (*)    All other components within normal limits  CBC WITH DIFFERENTIAL/PLATELET - Abnormal; Notable for the following:    Hemoglobin 11.1 (*)    HCT 32.1 (*)    RDW 16.0 (*)    Monocytes Absolute 1.1 (*)    All other components within normal limits     Imaging Review Dg Pelvis Portable  03/26/2015   CLINICAL DATA:  69 year old female with possible right hip dislocation.  EXAM: PORTABLE PELVIS 1-2 VIEWS  COMPARISON:  Radiograph dated 02/11/2011  FINDINGS: There bilateral total hip arthroplasties. There is no evidence of acute fracture or dislocation. There is degenerative changes of the lower lumbar spine. Constipation.  IMPRESSION: Bilateral hip arthroplasties.  No acute fracture or dislocation.   Electronically Signed   By: Anner Crete M.D.   On: 03/26/2015 22:39   Dg Hip Unilat With Pelvis 2-3 Views Right  03/26/2015   CLINICAL DATA:  Right hip pain and deformity. Right hip prosthesis with previous dislocations.  EXAM: DG HIP (WITH OR WITHOUT PELVIS) 2-3V RIGHT  COMPARISON:  03/13/2015  FINDINGS: Right total hip arthroplasty using non cemented femoral component. Screw fixations of the acetabular component. There is superior and lateral dislocation of the femoral prosthesis with respect to the acetabular cup. Steep appearing acetabular angle. No acute fracture or dislocation is appreciated. Left total hip arthroplasty is also present and appears intact.  IMPRESSION: Dislocation of right hip arthroplasty.   Electronically Signed   By: Lucienne Capers M.D.   On: 03/26/2015 21:30      MDM   Final diagnoses:  Hip dislocation, right, initial encounter   Pt successfully reduced.  Feels much better.  Ready to go home. Dc home with knee imobilizer.  Follow up with Dr Jacqulyn Bath, MD 03/26/15 [phone]

## 2015-03-28 DIAGNOSIS — S73004A Unspecified dislocation of right hip, initial encounter: Secondary | ICD-10-CM | POA: Diagnosis not present

## 2015-04-04 DIAGNOSIS — F17200 Nicotine dependence, unspecified, uncomplicated: Secondary | ICD-10-CM | POA: Diagnosis not present

## 2015-04-04 DIAGNOSIS — M542 Cervicalgia: Secondary | ICD-10-CM | POA: Diagnosis not present

## 2015-04-04 DIAGNOSIS — J189 Pneumonia, unspecified organism: Secondary | ICD-10-CM | POA: Diagnosis not present

## 2015-04-04 DIAGNOSIS — J449 Chronic obstructive pulmonary disease, unspecified: Secondary | ICD-10-CM | POA: Diagnosis not present

## 2015-04-04 DIAGNOSIS — M40209 Unspecified kyphosis, site unspecified: Secondary | ICD-10-CM | POA: Diagnosis not present

## 2015-04-04 DIAGNOSIS — I10 Essential (primary) hypertension: Secondary | ICD-10-CM | POA: Diagnosis not present

## 2015-04-04 DIAGNOSIS — E119 Type 2 diabetes mellitus without complications: Secondary | ICD-10-CM | POA: Diagnosis not present

## 2015-04-04 DIAGNOSIS — I2510 Atherosclerotic heart disease of native coronary artery without angina pectoris: Secondary | ICD-10-CM | POA: Diagnosis not present

## 2015-04-17 DIAGNOSIS — R938 Abnormal findings on diagnostic imaging of other specified body structures: Secondary | ICD-10-CM | POA: Diagnosis not present

## 2015-04-17 DIAGNOSIS — R1310 Dysphagia, unspecified: Secondary | ICD-10-CM | POA: Diagnosis not present

## 2015-04-17 DIAGNOSIS — M542 Cervicalgia: Secondary | ICD-10-CM | POA: Diagnosis not present

## 2015-04-17 DIAGNOSIS — I2510 Atherosclerotic heart disease of native coronary artery without angina pectoris: Secondary | ICD-10-CM | POA: Diagnosis not present

## 2015-04-17 DIAGNOSIS — J189 Pneumonia, unspecified organism: Secondary | ICD-10-CM | POA: Diagnosis not present

## 2015-04-17 DIAGNOSIS — F17200 Nicotine dependence, unspecified, uncomplicated: Secondary | ICD-10-CM | POA: Diagnosis not present

## 2015-04-17 DIAGNOSIS — R600 Localized edema: Secondary | ICD-10-CM | POA: Diagnosis not present

## 2015-04-17 DIAGNOSIS — I10 Essential (primary) hypertension: Secondary | ICD-10-CM | POA: Diagnosis not present

## 2015-04-17 DIAGNOSIS — E119 Type 2 diabetes mellitus without complications: Secondary | ICD-10-CM | POA: Diagnosis not present

## 2015-04-18 ENCOUNTER — Telehealth: Payer: Self-pay | Admitting: Cardiology

## 2015-04-19 ENCOUNTER — Other Ambulatory Visit: Payer: Self-pay | Admitting: Family Medicine

## 2015-04-19 DIAGNOSIS — J189 Pneumonia, unspecified organism: Secondary | ICD-10-CM

## 2015-04-19 NOTE — Telephone Encounter (Signed)
Close encounter 

## 2015-04-25 ENCOUNTER — Inpatient Hospital Stay: Admission: RE | Admit: 2015-04-25 | Payer: Medicare Other | Source: Ambulatory Visit

## 2015-04-26 ENCOUNTER — Emergency Department (HOSPITAL_COMMUNITY): Payer: Medicare Other | Admitting: Anesthesiology

## 2015-04-26 ENCOUNTER — Emergency Department (HOSPITAL_COMMUNITY): Payer: Medicare Other

## 2015-04-26 ENCOUNTER — Encounter (HOSPITAL_COMMUNITY): Payer: Self-pay | Admitting: Emergency Medicine

## 2015-04-26 ENCOUNTER — Inpatient Hospital Stay (HOSPITAL_COMMUNITY)
Admission: EM | Admit: 2015-04-26 | Discharge: 2015-05-06 | DRG: 495 | Disposition: A | Discharge status: Home Health | Payer: Medicare Other | Attending: Internal Medicine | Admitting: Internal Medicine

## 2015-04-26 ENCOUNTER — Encounter (HOSPITAL_COMMUNITY)
Admission: EM | Disposition: A | Discharge status: Home Health | Payer: Self-pay | Source: Home / Self Care | Attending: Internal Medicine

## 2015-04-26 ENCOUNTER — Inpatient Hospital Stay: Admit: 2015-04-26 | Payer: Self-pay | Admitting: Orthopedic Surgery

## 2015-04-26 DIAGNOSIS — K828 Other specified diseases of gallbladder: Secondary | ICD-10-CM | POA: Diagnosis not present

## 2015-04-26 DIAGNOSIS — E46 Unspecified protein-calorie malnutrition: Secondary | ICD-10-CM | POA: Diagnosis not present

## 2015-04-26 DIAGNOSIS — K223 Perforation of esophagus: Secondary | ICD-10-CM | POA: Insufficient documentation

## 2015-04-26 DIAGNOSIS — G894 Chronic pain syndrome: Secondary | ICD-10-CM | POA: Diagnosis present

## 2015-04-26 DIAGNOSIS — Z853 Personal history of malignant neoplasm of breast: Secondary | ICD-10-CM

## 2015-04-26 DIAGNOSIS — Z8249 Family history of ischemic heart disease and other diseases of the circulatory system: Secondary | ICD-10-CM | POA: Diagnosis not present

## 2015-04-26 DIAGNOSIS — I2510 Atherosclerotic heart disease of native coronary artery without angina pectoris: Secondary | ICD-10-CM | POA: Diagnosis present

## 2015-04-26 DIAGNOSIS — D649 Anemia, unspecified: Secondary | ICD-10-CM | POA: Diagnosis present

## 2015-04-26 DIAGNOSIS — E876 Hypokalemia: Secondary | ICD-10-CM | POA: Diagnosis present

## 2015-04-26 DIAGNOSIS — L0211 Cutaneous abscess of neck: Secondary | ICD-10-CM

## 2015-04-26 DIAGNOSIS — Z01811 Encounter for preprocedural respiratory examination: Secondary | ICD-10-CM

## 2015-04-26 DIAGNOSIS — Y95 Nosocomial condition: Secondary | ICD-10-CM | POA: Diagnosis present

## 2015-04-26 DIAGNOSIS — K2289 Other specified disease of esophagus: Secondary | ICD-10-CM

## 2015-04-26 DIAGNOSIS — M4642 Discitis, unspecified, cervical region: Secondary | ICD-10-CM | POA: Diagnosis present

## 2015-04-26 DIAGNOSIS — I252 Old myocardial infarction: Secondary | ICD-10-CM

## 2015-04-26 DIAGNOSIS — T84020A Dislocation of internal right hip prosthesis, initial encounter: Secondary | ICD-10-CM | POA: Diagnosis present

## 2015-04-26 DIAGNOSIS — M542 Cervicalgia: Secondary | ICD-10-CM

## 2015-04-26 DIAGNOSIS — Z794 Long term (current) use of insulin: Secondary | ICD-10-CM | POA: Diagnosis not present

## 2015-04-26 DIAGNOSIS — R1319 Other dysphagia: Secondary | ICD-10-CM

## 2015-04-26 DIAGNOSIS — E785 Hyperlipidemia, unspecified: Secondary | ICD-10-CM | POA: Diagnosis not present

## 2015-04-26 DIAGNOSIS — I10 Essential (primary) hypertension: Secondary | ICD-10-CM | POA: Diagnosis not present

## 2015-04-26 DIAGNOSIS — Z452 Encounter for adjustment and management of vascular access device: Secondary | ICD-10-CM | POA: Diagnosis not present

## 2015-04-26 DIAGNOSIS — J390 Retropharyngeal and parapharyngeal abscess: Secondary | ICD-10-CM | POA: Diagnosis not present

## 2015-04-26 DIAGNOSIS — Z418 Encounter for other procedures for purposes other than remedying health state: Secondary | ICD-10-CM

## 2015-04-26 DIAGNOSIS — Z434 Encounter for attention to other artificial openings of digestive tract: Secondary | ICD-10-CM | POA: Diagnosis not present

## 2015-04-26 DIAGNOSIS — K810 Acute cholecystitis: Secondary | ICD-10-CM

## 2015-04-26 DIAGNOSIS — K819 Cholecystitis, unspecified: Secondary | ICD-10-CM

## 2015-04-26 DIAGNOSIS — J449 Chronic obstructive pulmonary disease, unspecified: Secondary | ICD-10-CM | POA: Diagnosis present

## 2015-04-26 DIAGNOSIS — L0291 Cutaneous abscess, unspecified: Secondary | ICD-10-CM

## 2015-04-26 DIAGNOSIS — J441 Chronic obstructive pulmonary disease with (acute) exacerbation: Secondary | ICD-10-CM | POA: Diagnosis present

## 2015-04-26 DIAGNOSIS — E119 Type 2 diabetes mellitus without complications: Secondary | ICD-10-CM

## 2015-04-26 DIAGNOSIS — M25551 Pain in right hip: Secondary | ICD-10-CM | POA: Diagnosis not present

## 2015-04-26 DIAGNOSIS — E871 Hypo-osmolality and hyponatremia: Secondary | ICD-10-CM | POA: Diagnosis not present

## 2015-04-26 DIAGNOSIS — M4622 Osteomyelitis of vertebra, cervical region: Secondary | ICD-10-CM

## 2015-04-26 DIAGNOSIS — T8463XA Infection and inflammatory reaction due to internal fixation device of spine, initial encounter: Secondary | ICD-10-CM | POA: Diagnosis not present

## 2015-04-26 DIAGNOSIS — R634 Abnormal weight loss: Secondary | ICD-10-CM | POA: Insufficient documentation

## 2015-04-26 DIAGNOSIS — T84020D Dislocation of internal right hip prosthesis, subsequent encounter: Secondary | ICD-10-CM | POA: Diagnosis not present

## 2015-04-26 DIAGNOSIS — Z419 Encounter for procedure for purposes other than remedying health state, unspecified: Secondary | ICD-10-CM

## 2015-04-26 DIAGNOSIS — M47812 Spondylosis without myelopathy or radiculopathy, cervical region: Secondary | ICD-10-CM | POA: Diagnosis not present

## 2015-04-26 DIAGNOSIS — F17200 Nicotine dependence, unspecified, uncomplicated: Secondary | ICD-10-CM | POA: Diagnosis not present

## 2015-04-26 DIAGNOSIS — T84029D Dislocation of unspecified internal joint prosthesis, subsequent encounter: Secondary | ICD-10-CM | POA: Diagnosis not present

## 2015-04-26 DIAGNOSIS — S73004A Unspecified dislocation of right hip, initial encounter: Secondary | ICD-10-CM | POA: Diagnosis not present

## 2015-04-26 DIAGNOSIS — J853 Abscess of mediastinum: Secondary | ICD-10-CM

## 2015-04-26 DIAGNOSIS — K228 Other specified diseases of esophagus: Secondary | ICD-10-CM | POA: Diagnosis present

## 2015-04-26 DIAGNOSIS — R1314 Dysphagia, pharyngoesophageal phase: Secondary | ICD-10-CM | POA: Diagnosis present

## 2015-04-26 DIAGNOSIS — T148 Other injury of unspecified body region: Secondary | ICD-10-CM | POA: Diagnosis not present

## 2015-04-26 DIAGNOSIS — J189 Pneumonia, unspecified organism: Secondary | ICD-10-CM

## 2015-04-26 DIAGNOSIS — Z880 Allergy status to penicillin: Secondary | ICD-10-CM | POA: Diagnosis not present

## 2015-04-26 DIAGNOSIS — Z96641 Presence of right artificial hip joint: Secondary | ICD-10-CM | POA: Diagnosis not present

## 2015-04-26 DIAGNOSIS — Y838 Other surgical procedures as the cause of abnormal reaction of the patient, or of later complication, without mention of misadventure at the time of the procedure: Secondary | ICD-10-CM | POA: Diagnosis present

## 2015-04-26 DIAGNOSIS — Z472 Encounter for removal of internal fixation device: Secondary | ICD-10-CM | POA: Diagnosis not present

## 2015-04-26 DIAGNOSIS — K838 Other specified diseases of biliary tract: Secondary | ICD-10-CM | POA: Diagnosis not present

## 2015-04-26 DIAGNOSIS — K633 Ulcer of intestine: Secondary | ICD-10-CM | POA: Diagnosis not present

## 2015-04-26 DIAGNOSIS — R05 Cough: Secondary | ICD-10-CM | POA: Diagnosis not present

## 2015-04-26 DIAGNOSIS — R1310 Dysphagia, unspecified: Secondary | ICD-10-CM | POA: Diagnosis not present

## 2015-04-26 DIAGNOSIS — Z881 Allergy status to other antibiotic agents status: Secondary | ICD-10-CM | POA: Diagnosis not present

## 2015-04-26 DIAGNOSIS — M4802 Spinal stenosis, cervical region: Secondary | ICD-10-CM | POA: Diagnosis not present

## 2015-04-26 DIAGNOSIS — M4640 Discitis, unspecified, site unspecified: Secondary | ICD-10-CM

## 2015-04-26 DIAGNOSIS — T84029A Dislocation of unspecified internal joint prosthesis, initial encounter: Secondary | ICD-10-CM

## 2015-04-26 DIAGNOSIS — F17210 Nicotine dependence, cigarettes, uncomplicated: Secondary | ICD-10-CM | POA: Diagnosis present

## 2015-04-26 DIAGNOSIS — Z96649 Presence of unspecified artificial hip joint: Secondary | ICD-10-CM

## 2015-04-26 DIAGNOSIS — B9689 Other specified bacterial agents as the cause of diseases classified elsewhere: Secondary | ICD-10-CM | POA: Diagnosis not present

## 2015-04-26 DIAGNOSIS — G061 Intraspinal abscess and granuloma: Secondary | ICD-10-CM | POA: Diagnosis not present

## 2015-04-26 DIAGNOSIS — S73001A Unspecified subluxation of right hip, initial encounter: Secondary | ICD-10-CM | POA: Diagnosis not present

## 2015-04-26 DIAGNOSIS — T849XXD Unspecified complication of internal orthopedic prosthetic device, implant and graft, subsequent encounter: Secondary | ICD-10-CM | POA: Diagnosis not present

## 2015-04-26 DIAGNOSIS — R000 Tachycardia, unspecified: Secondary | ICD-10-CM | POA: Diagnosis not present

## 2015-04-26 DIAGNOSIS — Z9889 Other specified postprocedural states: Secondary | ICD-10-CM | POA: Diagnosis not present

## 2015-04-26 DIAGNOSIS — Z5181 Encounter for therapeutic drug level monitoring: Secondary | ICD-10-CM | POA: Diagnosis not present

## 2015-04-26 DIAGNOSIS — Z9011 Acquired absence of right breast and nipple: Secondary | ICD-10-CM | POA: Diagnosis not present

## 2015-04-26 HISTORY — PX: HIP CLOSED REDUCTION: SHX983

## 2015-04-26 LAB — CBC WITH DIFFERENTIAL/PLATELET
Basophils Absolute: 0 10*3/uL (ref 0.0–0.1)
Basophils Relative: 0 % (ref 0–1)
Eosinophils Absolute: 0 10*3/uL (ref 0.0–0.7)
Eosinophils Relative: 0 % (ref 0–5)
HCT: 30.6 % — ABNORMAL LOW (ref 36.0–46.0)
Hemoglobin: 10 g/dL — ABNORMAL LOW (ref 12.0–15.0)
Lymphocytes Relative: 4 % — ABNORMAL LOW (ref 12–46)
Lymphs Abs: 0.5 10*3/uL — ABNORMAL LOW (ref 0.7–4.0)
MCH: 27.4 pg (ref 26.0–34.0)
MCHC: 32.7 g/dL (ref 30.0–36.0)
MCV: 83.8 fL (ref 78.0–100.0)
Monocytes Absolute: 1.3 10*3/uL — ABNORMAL HIGH (ref 0.1–1.0)
Monocytes Relative: 10 % (ref 3–12)
Neutro Abs: 11 10*3/uL — ABNORMAL HIGH (ref 1.7–7.7)
Neutrophils Relative %: 86 % — ABNORMAL HIGH (ref 43–77)
Platelets: 232 10*3/uL (ref 150–400)
RBC: 3.65 MIL/uL — ABNORMAL LOW (ref 3.87–5.11)
RDW: 14.8 % (ref 11.5–15.5)
WBC: 12.8 10*3/uL — ABNORMAL HIGH (ref 4.0–10.5)

## 2015-04-26 LAB — URINALYSIS, ROUTINE W REFLEX MICROSCOPIC
Glucose, UA: NEGATIVE mg/dL
Ketones, ur: NEGATIVE mg/dL
Nitrite: NEGATIVE
PH: 6 (ref 5.0–8.0)
Protein, ur: 100 mg/dL — AB
Specific Gravity, Urine: 1.02 (ref 1.005–1.030)
Urobilinogen, UA: 1 mg/dL (ref 0.0–1.0)

## 2015-04-26 LAB — BASIC METABOLIC PANEL
ANION GAP: 10 (ref 5–15)
BUN: 18 mg/dL (ref 6–20)
CO2: 30 mmol/L (ref 22–32)
Calcium: 8.4 mg/dL — ABNORMAL LOW (ref 8.9–10.3)
Chloride: 89 mmol/L — ABNORMAL LOW (ref 101–111)
Creatinine, Ser: 1.12 mg/dL — ABNORMAL HIGH (ref 0.44–1.00)
GFR calc Af Amer: 58 mL/min — ABNORMAL LOW (ref >60)
GFR, EST NON AFRICAN AMERICAN: 50 mL/min — AB (ref >60)
Glucose, Bld: 154 mg/dL — ABNORMAL HIGH (ref 65–99)
Potassium: 3.2 mmol/L — ABNORMAL LOW (ref 3.5–5.1)
SODIUM: 129 mmol/L — AB (ref 135–145)

## 2015-04-26 LAB — GLUCOSE, CAPILLARY: Glucose-Capillary: 107 mg/dL — ABNORMAL HIGH (ref 65–99)

## 2015-04-26 LAB — URINE MICROSCOPIC-ADD ON

## 2015-04-26 LAB — I-STAT CG4 LACTIC ACID, ED: Lactic Acid, Venous: 1.3 mmol/L (ref 0.5–2.0)

## 2015-04-26 SURGERY — CLOSED REDUCTION, HIP
Anesthesia: General | Site: Hip | Laterality: Right

## 2015-04-26 MED ORDER — CIPROFLOXACIN IN D5W 200 MG/100ML IV SOLN
200.0000 mg | Freq: Two times a day (BID) | INTRAVENOUS | Status: DC
  Administered 2015-04-27: 200 mg via INTRAVENOUS
  Filled 2015-04-26 (×2): qty 100

## 2015-04-26 MED ORDER — PROPOFOL 10 MG/ML IV BOLUS
INTRAVENOUS | Status: DC | PRN
  Administered 2015-04-26: 140 mg via INTRAVENOUS

## 2015-04-26 MED ORDER — TIZANIDINE HCL 4 MG PO TABS
4.0000 mg | ORAL_TABLET | Freq: Three times a day (TID) | ORAL | Status: DC | PRN
  Administered 2015-04-27: 4 mg via ORAL
  Filled 2015-04-26 (×2): qty 1

## 2015-04-26 MED ORDER — LIDOCAINE HCL 4 % MT SOLN: OROMUCOSAL | Status: DC | PRN

## 2015-04-26 MED ORDER — ASPIRIN EC 81 MG PO TBEC
81.0000 mg | DELAYED_RELEASE_TABLET | Freq: Every day | ORAL | Status: DC
  Administered 2015-04-27: 81 mg via ORAL
  Filled 2015-04-26: qty 1

## 2015-04-26 MED ORDER — DEXTROSE 5 % IV SOLN
2.0000 g | Freq: Three times a day (TID) | INTRAVENOUS | Status: DC
  Administered 2015-04-27 – 2015-04-28 (×5): 2 g via INTRAVENOUS
  Filled 2015-04-26 (×6): qty 2

## 2015-04-26 MED ORDER — GABAPENTIN 300 MG PO CAPS
600.0000 mg | ORAL_CAPSULE | Freq: Three times a day (TID) | ORAL | Status: DC
  Administered 2015-04-27 – 2015-05-02 (×18): 600 mg via ORAL
  Filled 2015-04-26 (×32): qty 2

## 2015-04-26 MED ORDER — FUROSEMIDE 40 MG PO TABS: 40.0000 mg | ORAL_TABLET | Freq: Two times a day (BID) | ORAL | Status: DC

## 2015-04-26 MED ORDER — FENTANYL CITRATE (PF) 100 MCG/2ML IJ SOLN: 25.0000 ug | INTRAMUSCULAR | Status: DC | PRN

## 2015-04-26 MED ORDER — SUCCINYLCHOLINE CHLORIDE 20 MG/ML IJ SOLN
INTRAMUSCULAR | Status: DC | PRN
  Administered 2015-04-26: 100 mg via INTRAVENOUS

## 2015-04-26 MED ORDER — VANCOMYCIN HCL 500 MG IV SOLR
500.0000 mg | Freq: Two times a day (BID) | INTRAVENOUS | Status: DC
  Filled 2015-04-26: qty 500

## 2015-04-26 MED ORDER — SIMVASTATIN 10 MG PO TABS
10.0000 mg | ORAL_TABLET | Freq: Every evening | ORAL | Status: DC
  Administered 2015-04-27 – 2015-05-02 (×6): 10 mg via ORAL
  Filled 2015-04-26 (×7): qty 1

## 2015-04-26 MED ORDER — ALBUTEROL SULFATE (2.5 MG/3ML) 0.083% IN NEBU: 3.0000 mL | INHALATION_SOLUTION | Freq: Four times a day (QID) | RESPIRATORY_TRACT | Status: DC | PRN

## 2015-04-26 MED ORDER — METOCLOPRAMIDE HCL 5 MG PO TABS
5.0000 mg | ORAL_TABLET | Freq: Three times a day (TID) | ORAL | Status: DC | PRN
  Filled 2015-04-26: qty 2

## 2015-04-26 MED ORDER — PROPOFOL 10 MG/ML IV BOLUS
0.5000 mg/kg | Freq: Once | INTRAVENOUS | Status: DC
  Filled 2015-04-26: qty 1

## 2015-04-26 MED ORDER — OXYCODONE HCL 5 MG/5ML PO SOLN: 5.0000 mg | Freq: Once | ORAL | Status: DC | PRN

## 2015-04-26 MED ORDER — ALBUMIN HUMAN 5 % IV SOLN
12.5000 g | Freq: Once | INTRAVENOUS | Status: AC
  Administered 2015-04-26: 12.5 g via INTRAVENOUS

## 2015-04-26 MED ORDER — ACETAMINOPHEN 325 MG PO TABS
650.0000 mg | ORAL_TABLET | Freq: Four times a day (QID) | ORAL | Status: DC | PRN
  Administered 2015-05-01 – 2015-05-02 (×2): 650 mg via ORAL
  Filled 2015-04-26 (×2): qty 2

## 2015-04-26 MED ORDER — DEXTROSE 5 % IV SOLN
2.0000 g | Freq: Once | INTRAVENOUS | Status: AC
  Administered 2015-04-26: 2 g via INTRAVENOUS
  Filled 2015-04-26: qty 2

## 2015-04-26 MED ORDER — SODIUM CHLORIDE 0.9 % IV BOLUS (SEPSIS)
1000.0000 mL | INTRAVENOUS | Status: AC
  Administered 2015-04-26 (×2): 1000 mL via INTRAVENOUS

## 2015-04-26 MED ORDER — LIDOCAINE HCL (CARDIAC) 20 MG/ML IV SOLN
INTRAVENOUS | Status: AC
  Filled 2015-04-26: qty 10

## 2015-04-26 MED ORDER — ALBUMIN HUMAN 5 % IV SOLN
INTRAVENOUS | Status: AC
  Filled 2015-04-26: qty 250

## 2015-04-26 MED ORDER — ACETAMINOPHEN 650 MG RE SUPP: 650.0000 mg | Freq: Four times a day (QID) | RECTAL | Status: DC | PRN

## 2015-04-26 MED ORDER — DICLOFENAC SODIUM 75 MG PO TBEC
75.0000 mg | DELAYED_RELEASE_TABLET | Freq: Two times a day (BID) | ORAL | Status: DC | PRN
Start: 2015-04-26 — End: 2015-05-03
  Administered 2015-04-27 – 2015-04-29 (×2): 75 mg via ORAL
  Filled 2015-04-26 (×4): qty 1

## 2015-04-26 MED ORDER — LIDOCAINE VISCOUS 2 % MT SOLN
15.0000 mL | Freq: Once | OROMUCOSAL | Status: DC
Start: 2015-04-27 — End: 2015-05-03
  Filled 2015-04-26 (×2): qty 15

## 2015-04-26 MED ORDER — DEXTROSE 5 % IV SOLN
1.0000 g | Freq: Three times a day (TID) | INTRAVENOUS | Status: DC
  Filled 2015-04-26: qty 1

## 2015-04-26 MED ORDER — ACETAMINOPHEN 650 MG RE SUPP
650.0000 mg | Freq: Once | RECTAL | Status: AC
  Administered 2015-04-26: 650 mg via RECTAL
  Filled 2015-04-26: qty 1

## 2015-04-26 MED ORDER — POTASSIUM CHLORIDE IN NACL 20-0.9 MEQ/L-% IV SOLN
INTRAVENOUS | Status: AC
  Administered 2015-04-27 – 2015-04-28 (×2): via INTRAVENOUS
  Filled 2015-04-26 (×4): qty 1000

## 2015-04-26 MED ORDER — VANCOMYCIN HCL IN DEXTROSE 1-5 GM/200ML-% IV SOLN
1000.0000 mg | Freq: Once | INTRAVENOUS | Status: AC
  Administered 2015-04-26: 1000 mg via INTRAVENOUS
  Filled 2015-04-26: qty 200

## 2015-04-26 MED ORDER — OXYCODONE HCL 5 MG PO TABS: 15.0000 mg | ORAL_TABLET | Freq: Once | ORAL | Status: DC | PRN

## 2015-04-26 MED ORDER — PROPOFOL 10 MG/ML IV BOLUS
INTRAVENOUS | Status: AC
  Filled 2015-04-26: qty 20

## 2015-04-26 MED ORDER — PHENYLEPHRINE HCL 10 MG/ML IJ SOLN
INTRAMUSCULAR | Status: DC | PRN
  Administered 2015-04-26 (×2): 80 ug via INTRAVENOUS
  Administered 2015-04-26: 120 ug via INTRAVENOUS

## 2015-04-26 MED ORDER — ALBUTEROL SULFATE (2.5 MG/3ML) 0.083% IN NEBU: 2.5000 mg | INHALATION_SOLUTION | Freq: Four times a day (QID) | RESPIRATORY_TRACT | Status: DC | PRN

## 2015-04-26 MED ORDER — MORPHINE SULFATE ER 30 MG PO TBCR
30.0000 mg | EXTENDED_RELEASE_TABLET | Freq: Four times a day (QID) | ORAL | Status: DC
  Administered 2015-04-27: 30 mg via ORAL
  Filled 2015-04-26: qty 1

## 2015-04-26 MED ORDER — ONDANSETRON HCL 4 MG/2ML IJ SOLN
4.0000 mg | Freq: Four times a day (QID) | INTRAMUSCULAR | Status: DC | PRN
  Administered 2015-05-03: 4 mg via INTRAVENOUS

## 2015-04-26 MED ORDER — LIDOCAINE HCL (CARDIAC) 20 MG/ML IV SOLN
INTRAVENOUS | Status: DC | PRN
  Administered 2015-04-26: 100 mg via INTRATRACHEAL

## 2015-04-26 MED ORDER — IOHEXOL 300 MG/ML  SOLN
75.0000 mL | Freq: Once | INTRAMUSCULAR | Status: AC | PRN
  Administered 2015-04-26: 80 mL via INTRAVENOUS

## 2015-04-26 MED ORDER — ONDANSETRON HCL 4 MG PO TABS: 4.0000 mg | ORAL_TABLET | Freq: Four times a day (QID) | ORAL | Status: DC | PRN

## 2015-04-26 MED ORDER — METOCLOPRAMIDE HCL 5 MG/ML IJ SOLN: 5.0000 mg | Freq: Three times a day (TID) | INTRAMUSCULAR | Status: DC | PRN

## 2015-04-26 MED ORDER — ENOXAPARIN SODIUM 30 MG/0.3ML ~~LOC~~ SOLN
30.0000 mg | SUBCUTANEOUS | Status: DC
  Administered 2015-04-27 – 2015-04-28 (×2): 30 mg via SUBCUTANEOUS
  Filled 2015-04-26 (×2): qty 0.3

## 2015-04-26 MED ORDER — ALPRAZOLAM 0.25 MG PO TABS
0.2500 mg | ORAL_TABLET | Freq: Every day | ORAL | Status: DC | PRN
  Administered 2015-04-29 – 2015-05-01 (×3): 0.25 mg via ORAL
  Filled 2015-04-26 (×5): qty 1

## 2015-04-26 MED ORDER — IPRATROPIUM BROMIDE 0.02 % IN SOLN: 2.5000 mL | Freq: Every day | RESPIRATORY_TRACT | Status: DC

## 2015-04-26 MED ORDER — ONDANSETRON HCL 4 MG/2ML IJ SOLN: 4.0000 mg | Freq: Once | INTRAMUSCULAR | Status: DC | PRN

## 2015-04-26 MED ORDER — ONDANSETRON HCL 4 MG/2ML IJ SOLN: 4.0000 mg | Freq: Four times a day (QID) | INTRAMUSCULAR | Status: DC | PRN

## 2015-04-26 MED ORDER — LINAGLIPTIN 5 MG PO TABS
5.0000 mg | ORAL_TABLET | Freq: Every day | ORAL | Status: DC
  Administered 2015-04-27: 5 mg via ORAL
  Filled 2015-04-26 (×2): qty 1

## 2015-04-26 MED ORDER — LOSARTAN POTASSIUM 25 MG PO TABS
25.0000 mg | ORAL_TABLET | Freq: Every day | ORAL | Status: DC
  Administered 2015-04-27 – 2015-05-02 (×6): 25 mg via ORAL
  Filled 2015-04-26 (×7): qty 1

## 2015-04-26 MED ORDER — LACTATED RINGERS IV SOLN
INTRAVENOUS | Status: DC | PRN
  Administered 2015-04-26: 19:00:00 via INTRAVENOUS

## 2015-04-26 MED ORDER — OXYCODONE HCL 5 MG PO TABS
5.0000 mg | ORAL_TABLET | Freq: Four times a day (QID) | ORAL | Status: DC | PRN
  Administered 2015-04-27 – 2015-05-01 (×10): 5 mg via ORAL
  Filled 2015-04-26 (×12): qty 1

## 2015-04-26 SURGICAL SUPPLY — 3 items
IMMOBILIZER KNEE 20 (SOFTGOODS) ×3
IMMOBILIZER KNEE 20 THIGH 36 (SOFTGOODS) IMPLANT
PILLOW ABDUCTION HIP (SOFTGOODS) ×2 IMPLANT

## 2015-04-26 NOTE — ED Notes (Signed)
Patient to OR

## 2015-04-26 NOTE — ED Notes (Signed)
Bed: YG47 Expected date:  Expected time:  Means of arrival:  Comments: EMS/elderly/R hip dislocation

## 2015-04-26 NOTE — Progress Notes (Signed)
Dr Olevia Bowens spoke with pt's husband via phone.

## 2015-04-26 NOTE — Brief Op Note (Signed)
04/26/2015  8:06 PM  PATIENT:  Eunice Blase  69 y.o. female  PRE-OPERATIVE DIAGNOSIS:  Right hip dislocation  POST-OPERATIVE DIAGNOSIS:  Right hip dislocation  PROCEDURE:  Procedure(s): CLOSED REDUCTION HIP (Right)  SURGEON:  Surgeon(s) and Role:    * Melina Schools, MD - Primary  PHYSICIAN ASSISTANT:   ASSISTANTS: none   ANESTHESIA:   general  EBL:     BLOOD ADMINISTERED:none  DRAINS: none   LOCAL MEDICATIONS USED:  NONE  SPECIMEN:  No Specimen  DISPOSITION OF SPECIMEN:  N/A  COUNTS:  YES  TOURNIQUET:  * No tourniquets in log *  DICTATION: .Other Dictation: Dictation Number [id number]  PLAN OF CARE: Admit to inpatient   PATIENT DISPOSITION:  PACU - hemodynamically stable.

## 2015-04-26 NOTE — ED Provider Notes (Signed)
CSN: [id number]     Arrival date & time 04/26/15  1534 History   First MD Initiated Contact with Patient 04/26/15 1538     Chief Complaint  Patient presents with  . Hip Pain     (Consider location/radiation/quality/duration/timing/severity/associated sxs/prior Treatment) HPI   69 year old female with hx of recurrent R prosthetic hip dislocation, most recent was 03/26/2015 which was reduced successfully in ER.  Pt brought here via EMS from home for another R hip dislocation.  Hx difficult to obtain as pt is drowsy 2/2 receiving 176mcg of Fentanyl PTA.  According to EMS, pt bend over shaving when her R hip spontaneously dislocated.  This is her 3 dislocation this month.  She also has an elevated temp of 102.9.  Pt sts she recently diagnosed with PNA, currently taking Levaquin.  Still endorse cough.      Past Medical History  Diagnosis Date  . Myocardial infarction 2000  . Diabetes mellitus type 2, insulin dependent   . COPD (chronic obstructive pulmonary disease)   . Hyperlipidemia   . Breast cancer   . OA (osteoarthritis) of knee   . Hypertension   . Coronary artery disease   . Asthma   . Elevated LFTs   . ETOH abuse   . Tobacco abuse   . Osteoarthritis    Past Surgical History  Procedure Laterality Date  . Breast surgery  1991    right mastectomy  . Vulvectomy  1981    partial  . Neck fusion    . Pelvic laparoscopy  2002    RSO  . Cesarean section  '78, '80, '81    x 3  . Hysteroscopy      D & C  . Cardiac catheterization  04/05/2009    EF 60%  . Lobectomy    . Tonsillectomy and adenoidectomy    . Mastectomy    . Coronary angioplasty  08/1998    x2 OF A BIFURCATION OM-1, OM-2 LESION  . Coronary angioplasty with stent placement  01/1999    MID FIRST OBTUSE MARGINAL VESSEL  . Coronary angioplasty with stent placement  07/1999    STENTING AT THE CRUX OF THE RIGHT CORONARY ARTERY WITH A 3.8MM X 18MM TETRA STENT  . Cardiovascular stress test  01/31/2005    EF 58%  .  Total hip arthroplasty  08/2010    bilat   Family History  Problem Relation Age of Onset  . Diabetes Mother   . Hypertension Father   . Heart disease Father   . Heart attack Father   . Stroke Father    Social History  Substance Use Topics  . Smoking status: Current Every Day Smoker -- 0.75 packs/day for 15 years    Types: Cigarettes  . Smokeless tobacco: Never Used  . Alcohol Use: No   OB History    No data available     Review of Systems  Unable to perform ROS: Mental status change      Allergies  Zithromax and Penicillins  Home Medications   Prior to Admission medications   Medication Sig Start Date End Date Taking? Authorizing Provider  albuterol (PROVENTIL HFA;VENTOLIN HFA) 108 (90 BASE) MCG/ACT inhaler Inhale 2-4 puffs into the lungs every 6 (six) hours as needed for wheezing (wheezing).     Historical Provider, MD  albuterol (PROVENTIL) (2.5 MG/3ML) 0.083% nebulizer solution Take 2.5 mg by nebulization every 6 (six) hours as needed.      Historical Provider, MD  ALPRAZolam (  XANAX) 0.25 MG tablet Take one tablet daily or as needed 05/14/11   Peter M Martinique, MD  aspirin EC 81 MG tablet Take 1 tablet (81 mg total) by mouth daily. 09/02/14   Orson Eva, MD  diclofenac (VOLTAREN) 75 MG EC tablet Take 1 tablet by mouth 2 (two) times daily as needed for mild pain or moderate pain (pain).  06/26/14   Historical Provider, MD  furosemide (LASIX) 40 MG tablet Take 1 tablet (40 mg total) by mouth 2 (two) times daily. Patient taking differently: Take 40 mg by mouth daily.  07/12/14   Peter M Martinique, MD  gabapentin (NEURONTIN) 600 MG tablet Take 600 mg by mouth 3 (three) times daily.     Historical Provider, MD  Ipratropium Bromide HFA (ATROVENT HFA IN) Inhale 17 mcg into the lungs at bedtime.     Historical Provider, MD  JANUVIA 25 MG tablet Take 12.5 mg by mouth daily.  06/12/14   Historical Provider, MD  levofloxacin (LEVAQUIN) 500 MG tablet Take 500 mg by mouth daily. 03/20/15    Historical Provider, MD  morphine (MS CONTIN) 30 MG 12 hr tablet Take 30 mg by mouth 4 (four) times daily.  06/26/14   Historical Provider, MD  nitroGLYCERIN (NITROSTAT) 0.4 MG SL tablet Place 1 tablet (0.4 mg total) under the tongue as needed. 07/20/14   Peter M Martinique, MD  oxyCODONE (ROXICODONE) 15 MG immediate release tablet Take 15 mg by mouth every 6 (six) hours as needed.      Historical Provider, MD  simvastatin (ZOCOR) 10 MG tablet Take 10 mg by mouth every evening.    Historical Provider, MD   There were no vitals taken for this visit. Physical Exam  Constitutional: She appears well-developed and well-nourished. No distress.  Caucasian elderly female, somnolence but arousable.    HENT:  Head: Atraumatic.  Eyes: Conjunctivae are normal.  Neck: Neck supple.  Cardiovascular:  Tachycardia, no M/R/G  Pulmonary/Chest:  Poor respiratory effort, scattered rhonchi without obvious rales.  Difficult to assess due to positioning.    Musculoskeletal: She exhibits tenderness (R hip:  Internally rotated and leg is shortening.  exquisite tenderness to lateral hip on palpation.  Sensation intact throughout major nerve distribution.  cap refills brisk.  Pedal pulse intact.  2+ pitting edema to R leg.  ).  Neurological: She is alert.  Skin: No rash noted.  Psychiatric: She has a normal mood and affect.  Nursing note and vitals reviewed.   ED Course  Procedures (including critical care time)  R hip dislocation from bending over to shave approximately 1 hr ago.  Will benefit from reduction attempt in ER.  Pt however is mildly hypoxic and somnolence 2/2 current pna and also received Fentanyl PTA.  Supplemental O2 given, pt placed on monitor.  Work up initiated.  Care discussed with Dr. Mingo Amber.  4:36 PM Dr. Mingo Amber has seen and evaluate pt, and recommend obtain Chest CT for further evaluation of her lung infection.  Labs ordered.  Pt will also receive antipyretic for her fever once hip is relocate.     5:13 PM Sepsis protocol initiated as patient meets SIRS criteria. She has penicillin allergies, antibiotic of choice would be vancomycin and aztreonam, pharmacy to dose.  Will consult ortho  Husband is at bedside, who report that pt has been somnolence and labored breathing for the past week.  She also is more confused even prior to administration of Fentanyl by EMS PTA.  She is currently on Levaquin.  5:39 PM I have consulted with oncall orthopedist, Dr. Jerene Pitch who request for medicine to admit pt and he will see pt in the ER.    Sepsis - Repeat Assessment  Performed at:    1802  Vitals     Blood pressure 102/48, pulse 89, temperature 102.9 F (39.4 C), temperature source Oral, resp. rate 20, height 5\' 5"  (1.651 m), weight 133 lb (60.328 kg), SpO2 100 %.  Heart:     Tachycardic  Lungs:    Rhonchi  Capillary Refill:   <2 sec  Peripheral Pulse:   Radial pulse palpable  Skin:     Normal Color  6:33 PM Appreciate consultation from [facility] Dr. Olevia Bowens who agrees to see pt in ER and will admit for further care.  Pt likely benefit step down bed.    CRITICAL CARE Performed by: Domenic Moras Total critical care time: 1 hr Critical care time was exclusive of separately billable procedures and treating other patients. Critical care was necessary to treat or prevent imminent or life-threatening deterioration. Critical care was time spent personally by me on the following activities: development of treatment plan with patient and/or surrogate as well as nursing, discussions with consultants, evaluation of patient's response to treatment, examination of patient, obtaining history from patient or surrogate, ordering and performing treatments and interventions, ordering and review of laboratory studies, ordering and review of radiographic studies, pulse oximetry and re-evaluation of patient's condition.    Labs Review Labs Reviewed  CBC WITH DIFFERENTIAL/PLATELET - Abnormal;  Notable for the following:    WBC 12.8 (*)    RBC 3.65 (*)    Hemoglobin 10.0 (*)    HCT 30.6 (*)    Neutrophils Relative % 86 (*)    Neutro Abs 11.0 (*)    Lymphocytes Relative 4 (*)    Lymphs Abs 0.5 (*)    Monocytes Absolute 1.3 (*)    All other components within normal limits  BASIC METABOLIC PANEL - Abnormal; Notable for the following:    Sodium 129 (*)    Potassium 3.2 (*)    Chloride 89 (*)    Glucose, Bld 154 (*)    Creatinine, Ser 1.12 (*)    Calcium 8.4 (*)    GFR calc non Af Amer 50 (*)    GFR calc Af Amer 58 (*)    All other components within normal limits  URINALYSIS, ROUTINE W REFLEX MICROSCOPIC (NOT AT [place]) - Abnormal; Notable for the following:    Color, Urine AMBER (*)    APPearance CLOUDY (*)    Hgb urine dipstick MODERATE (*)    Bilirubin Urine SMALL (*)    Protein, ur 100 (*)    Leukocytes, UA TRACE (*)    All other components within normal limits  URINE MICROSCOPIC-ADD ON - Abnormal; Notable for the following:    Squamous Epithelial / LPF FEW (*)    Casts HYALINE CASTS (*)    All other components within normal limits  CULTURE, BLOOD (ROUTINE X 2)  CULTURE, BLOOD (ROUTINE X 2)  URINE CULTURE  I-STAT CG4 LACTIC ACID, ED    Imaging Review Dg Pelvis Portable  04/26/2015   CLINICAL DATA:  Right hip dislocation.  EXAM: PORTABLE PELVIS 1-2 VIEWS  COMPARISON:  03/26/2015  FINDINGS: The right hip prosthesis is dislocated with the femoral component displacing superior to the acetabular component. The prosthetic components remain well seated. There is no acute fracture.  Left hip prosthesis is well-seated and aligned.  Bones are demineralized.  IMPRESSION: Dislocated right hip prosthesis. No prosthetic loosening or evidence of a fracture.   Electronically Signed   By: Lajean Manes M.D.   On: 04/26/2015 17:00     EKG Interpretation   Date/Time:  Thursday April 26 2015 17:25:27 EDT Ventricular Rate:  91 PR Interval:  119 QRS Duration: 89 QT Interval:   411 QTC Calculation: 506 R Axis:   26 Text Interpretation:  Sinus rhythm Borderline short PR interval Borderline  low voltage, extremity leads Nonspecific T abnormalities, lateral leads  Prolonged QT interval No significant change since last tracing Confirmed  by Mingo Amber  MD, BLAIR ([id number]) on 04/26/2015 6:07:16 PM      MDM   Final diagnoses:  Hip dislocation, right, initial encounter  HCAP (healthcare-associated pneumonia)   BP 105/51 mmHg  Pulse 78  Temp(Src) 102.9 F (39.4 C) (Oral)  Resp 10  Ht 5\' 5"  (1.651 m)  Wt 133 lb (60.328 kg)  BMI 22.13 kg/m2  SpO2 99%  I have reviewed nursing notes and vital signs. I personally viewed the imaging tests through PACS system and agrees with radiologist's intepretation I reviewed available ER/hospitalization records through the EMR     Domenic Moras, PA-C 04/28/15 [place], MD 05/01/15 2314

## 2015-04-26 NOTE — Anesthesia Postprocedure Evaluation (Signed)
  Anesthesia Post-op Note  Patient: [name]  Procedure(s) Performed: Procedure(s) (LRB): CLOSED REDUCTION HIP (Right)  Patient Location: PACU  Anesthesia Type: General  Level of Consciousness: awake and alert   Airway and Oxygen Therapy: Patient Spontanous Breathing  Post-op Pain: mild  Post-op Assessment: Post-op Vital signs reviewed, Patient's Cardiovascular Status Stable, Respiratory Function Stable, Patent Airway and No signs of Nausea or vomiting  Last Vitals:  Filed Vitals:   04/26/15 2100  BP: 98/55  Pulse: 69  Temp:   Resp: 17    Post-op Vital Signs: stable   Complications: No apparent anesthesia complications

## 2015-04-26 NOTE — Progress Notes (Addendum)
Dr Olevia Bowens at bedside. States pt will need a step down bed

## 2015-04-26 NOTE — ED Notes (Signed)
Patient here from home with complaints of right hip dislocation. States that she was shaving her legs with right hip popped out of place. 142mcg Fent given. Hx of same. 3 dislocations this month.

## 2015-04-26 NOTE — H&P (Signed)
[facility] History and Physical  [name] IHK:[id number] DOB: [birth year] DOA: 04/26/2015  Referring physician: Fayrene Helper, PA-C PCP: Redmond Baseman, MD   Chief Complaint: Hip pain.  HPI: [name] is a 69 y.o. female with a past medical history of CAD, status post MI, insulin requiring type 2 diabetes, COPD, tobacco abuse disorder, EtOH abuse, hypertension, hyperlipidemia, history of breast cancer who was brought to the emergency department after having hip pain secondary to her third prosthetic hip joint dislocation in the recent past. As per husband, she was trying to shave her legs when the dislocation happened. He also states that for the past week or so she hasn't been feeling well, has had fatigue, chills, decreased appetite, cough and was noticed to have a picture of 102.54F. She has been recently treated for pneumonia and was taking Levaquin.  Workup in the ER has revealed also a mediastinal abscess, lung infiltrates, gallbladder and biliary tree distention, leukocytosis, hyponatremia, hypokalemia.   When seen in PACU patient was in no acute distress and mildly confused. She she stated that she couldn't remember all the details and she asked me to talk to her husband to provide details of her history.  Review of Systems:  Unable to fully evaluate due to the patient's sedation. History was taken via phone from her husband.  Past Medical History  Diagnosis Date  . Myocardial infarction 2000  . Diabetes mellitus type 2, insulin dependent   . COPD (chronic obstructive pulmonary disease)   . Hyperlipidemia   . OA (osteoarthritis) of knee   . Hypertension   . Coronary artery disease   . Asthma   . Elevated LFTs   . ETOH abuse   . Tobacco abuse   . Osteoarthritis   . Breast cancer    Past Surgical History  Procedure Laterality Date  . Breast surgery  1991    right mastectomy  . Vulvectomy  1981    partial  . Neck fusion    . Pelvic laparoscopy   2002    RSO  . Cesarean section  '78, '80, '81    x 3  . Hysteroscopy      D & C  . Cardiac catheterization  04/05/2009    EF 60%  . Lobectomy    . Tonsillectomy and adenoidectomy    . Mastectomy    . Coronary angioplasty  08/1998    x2 OF A BIFURCATION OM-1, OM-2 LESION  . Coronary angioplasty with stent placement  01/1999    MID FIRST OBTUSE MARGINAL VESSEL  . Coronary angioplasty with stent placement  07/1999    STENTING AT THE CRUX OF THE RIGHT CORONARY ARTERY WITH A 3.8MM X TETRA STENT  . Cardiovascular stress test  01/31/2005    EF 58%  . Total hip arthroplasty  08/2010    bilat   Social History:  reports that she has been smoking Cigarettes.  She has a 11.25 pack-year smoking history. She has never used smokeless tobacco. She reports that she does not drink alcohol or use illicit drugs.  Allergies  Allergen Reactions  . Zithromax [Azithromycin Dihydrate]     ORAL ULCERS   . Penicillins Rash    Family History  Problem Relation Age of Onset  . Diabetes Mother   . Hypertension Father   . Heart disease Father   . Heart attack Father   . Stroke Father     Prior to Admission medications   Medication Sig Start Date  End Date Taking? Authorizing Provider  albuterol (PROVENTIL HFA;VENTOLIN HFA) 108 (90 BASE) MCG/ACT inhaler Inhale 2-4 puffs into the lungs every 6 (six) hours as needed for wheezing (wheezing).    Yes Historical Provider, MD  albuterol (PROVENTIL) (2.5 MG/3ML) 0.083% nebulizer solution Take 2.5 mg by nebulization every 6 (six) hours as needed.     Yes Historical Provider, MD  ALPRAZolam Prudy Feeler) 0.25 MG tablet Take one tablet daily or as needed Patient taking differently: Take 0.25 mg by mouth daily as needed for anxiety. Take one tablet daily or as needed 05/14/11  Yes Peter M [place], MD  aspirin EC 81 MG tablet Take 1 tablet (81 mg total) by mouth daily. 09/02/14  Yes Catarina Hartshorn, MD  diclofenac (VOLTAREN) 75 MG EC tablet Take 75 mg by mouth 2 (two) times  daily as needed for mild pain or moderate pain (pain).  06/26/14  Yes Historical Provider, MD  furosemide (LASIX) 40 MG tablet Take 1 tablet (40 mg total) by mouth 2 (two) times daily. Patient taking differently: Take 40 mg by mouth daily.  07/12/14  Yes Peter M [place], MD  gabapentin (NEURONTIN) 600 MG tablet Take 600 mg by mouth 3 (three) times daily.    Yes Historical Provider, MD  Ipratropium Bromide HFA (ATROVENT HFA IN) Inhale 17 mcg into the lungs at bedtime.    Yes Historical Provider, MD  JANUVIA 25 MG tablet Take 12.5 mg by mouth daily.  06/12/14  Yes Historical Provider, MD  losartan (COZAAR) 25 MG tablet Take 25 mg by mouth daily. 04/04/15  Yes Historical Provider, MD  morphine (MS CONTIN) 30 MG 12 hr tablet Take 30 mg by mouth 4 (four) times daily.  06/26/14  Yes Historical Provider, MD  oxyCODONE (ROXICODONE) 15 MG immediate release tablet Take 15 mg by mouth every 6 (six) hours as needed for pain.    Yes Historical Provider, MD  simvastatin (ZOCOR) 10 MG tablet Take 10 mg by mouth every evening.   Yes Historical Provider, MD  tiZANidine (ZANAFLEX) 4 MG tablet Take 4 mg by mouth every 8 (eight) hours as needed for muscle spasms.  04/01/15  Yes Historical Provider, MD  nitroGLYCERIN (NITROSTAT) 0.4 MG SL tablet Place 1 tablet (0.4 mg total) under the tongue as needed. Patient not taking: Reported on 04/26/2015 07/20/14   Peter M [place], MD   Physical Exam: Filed Vitals:   04/26/15 2130 04/26/15 2145 04/26/15 2200 04/26/15 2215  BP: 84/43 87/42 79/44  87/41  Pulse: 74 66 68 70  Temp:      TempSrc:      Resp: 15 18 13 16   Height:      Weight:      SpO2: 91% 94% 96% 95%    Wt Readings from Last 3 Encounters:  04/26/15 60.328 kg (133 lb)  03/26/15 60.328 kg (133 lb)  03/13/15 62.87 kg (138 lb 9.7 oz)    General:  Appears calm and comfortable Eyes: PERRL, normal lids, irises & conjunctiva ENT: grossly normal hearing, lips & tongue are dry Neck: no LAD, masses or  thyromegaly Cardiovascular: RRR, no m/r/g. No LE edema. Telemetry: SR, no arrhythmias  Respiratory: Mild wheezing and rhonchi's bilaterally Abdomen: soft, ntnd Skin: no rash or induration seen on limited exam Musculoskeletal: Bandages and dressings in place. Psychiatric: Mildly sedated from anesthesia given for prosthetic hip joint dislocation reduction. Mildly confused. Neurologic: Seems to be non-focal, but unable to fully evaluate.           Labs on  Admission:  Basic Metabolic Panel:  Recent Labs Lab 04/26/15 1633  NA 129*  K 3.2*  CL 89*  CO2 30  GLUCOSE 154*  BUN 18  CREATININE 1.12*  CALCIUM 8.4*   Liver Function Tests: No results for input(s): AST, ALT, ALKPHOS, BILITOT, PROT, ALBUMIN in the last 168 hours. No results for input(s): LIPASE, AMYLASE in the last 168 hours. No results for input(s): AMMONIA in the last 168 hours. CBC:  Recent Labs Lab 04/26/15 1633  WBC 12.8*  NEUTROABS 11.0*  HGB 10.0*  HCT 30.6*  MCV 83.8  PLT 232    ProBNP (last 3 results)  Recent Labs  08/27/14 1611 08/28/14 0530  PROBNP 5589.0* 8165.0*    CBG:  Recent Labs Lab 04/26/15 2101  GLUCAP 107*    Radiological Exams on Admission: Ct Chest W Contrast  04/26/2015   CLINICAL DATA:  Fever and cough.  Evaluate for pneumonia.  EXAM: CT CHEST WITH CONTRAST  TECHNIQUE: Multidetector CT imaging of the chest was performed during intravenous contrast administration.  CONTRAST:  80mL OMNIPAQUE IOHEXOL 300 MG/ML  SOLN  COMPARISON:  Chest radiograph 04/04/2015.  Chest CT 08/29/2014  FINDINGS: There is abnormal soft tissue in the lower neck along the left side of the thyroid gland and surrounding the upper esophagus. There is a small focal low-density structure along the left anterior aspect of C7 and T1. This collection has a small focus of gas within it and measures 1.1 x 0.9 x 2.0 cm. This collection is compatible with a soft tissue abscess. There is no significant fat plane between  the proximal esophagus and the soft tissue abscess. Soft tissues in the left subcarinal region and distal esophageal region are indistinct. Difficult to exclude inflammatory changes around the distal esophageal region, particularly on sequence 2, image 31.  There are coronary artery calcifications. The gallbladder is distended and there is at least mild intrahepatic biliary dilatation. Limited evaluation of the anterior right liver due to artifact and motion. There may be abnormal enhancement along the anterior right hepatic lobe. In addition, there are slightly prominent lymph nodes in the upper abdomen and the left periaortic region. Some of these nodes were present on the exam from 2012.  The trachea and mainstem bronchi are patent. There appears chronic or recurrent filling defects in the left lower lobe bronchus and left lower lobe bronchi. There is chronic volume loss in the left lower lobe. Chronic patchy parenchymal densities in left lower lobe. Linear densities in medial right lower lobe could represent scar or atelectasis. There is a chronic of sub solid nodular structure in the anterior right upper lobe on sequence 4, image 18 measuring 5 mm. This has not significantly changed since 2012.  There is an anterior surgical plate in the cervical spine extending down to C6. There is no evidence for bone destruction involving the lower cervical spine or upper thoracic spine adjacent to the area of inflammation and abscess collection.  IMPRESSION: There is abnormal soft tissue in the lower neck and upper posterior mediastinum which likely represents edema and an inflammatory process. There is a small air-fluid collection along the left side of the cervical-thoracic junction which is compatible with an abscess. There is no clear evidence for bone destruction. The source for this inflammatory process is unclear. Upper esophagus is poorly characterized.  There are endobronchial filling defects in the left lower lobe  airways. Similar findings were present on prior examinations and this could represent an area of recurrent mucous plugging  or aspiration. There is some chronic volume loss in the left lower lobe.  The soft tissue along the mid and distal esophagus is indistinct. Difficult to exclude an inflammatory process in this region.  Gallbladder distension with at least mild intrahepatic biliary dilatation. Difficult to exclude abnormal enhancement along the anterior right hepatic lobe. This area could be further characterized with ultrasound.  Critical Value/emergent results were called by telephone at the time of interpretation on 04/26/2015 at 7:47 pm to Dr. Elwin Mocha , who verbally acknowledged these results.   Electronically Signed   By: Richarda Overlie M.D.   On: 04/26/2015 20:03   Dg Pelvis Portable  04/26/2015   CLINICAL DATA:  Right hip dislocation.  EXAM: PORTABLE PELVIS 1-2 VIEWS  COMPARISON:  03/26/2015  FINDINGS: The right hip prosthesis is dislocated with the femoral component displacing superior to the acetabular component. The prosthetic components remain well seated. There is no acute fracture.  Left hip prosthesis is well-seated and aligned.  Bones are demineralized.  IMPRESSION: Dislocated right hip prosthesis. No prosthetic loosening or evidence of a fracture.   Electronically Signed   By: Amie Portland M.D.   On: 04/26/2015 17:00   Dg Hip Port Unilat With Pelvis 1v Right  04/26/2015   CLINICAL DATA:  69 year old female with right hip arthroplasty complicated by recent right hip dislocation status post right hip reduction.  EXAM: DG HIP (WITH OR WITHOUT PELVIS) 1V PORT RIGHT  COMPARISON:  04/26/2015.  FINDINGS: Postoperative changes of right total hip arthroplasty again noted. The prosthetic femoral head has been successfully relocated within the prosthetic acetabulum. No periprosthetic fractures are identified. Iodinated contrast material in the urinary bladder incidentally noted, presumably related to  recent contrast enhanced chest CT.  IMPRESSION: 1. Successful relocation of right hip, without acute complicating features.   Electronically Signed   By: Trudie Reed M.D.   On: 04/26/2015 20:19    EKG: Independently reviewed. Vent. rate 91 BPM PR interval 119 ms QRS duration 89 ms QT/QTc 411/506 ms P-R-T axes 37 26 -60 Sinus rhythm Borderline short PR interval Borderline low voltage, extremity leads Nonspecific T abnormalities, lateral leads Prolonged QT interval  Assessment/Plan Principal Problem:   Healthcare-associated pneumonia Supplemental oxygen. Broad-spectrum IV antibiotics.  Follow-up blood and sputum cultures.  Active Problems:   COPD (chronic obstructive pulmonary disease) Continue bronchodilators, supplemental oxygen. Pulmicort via nebulizer twice a day.    Hyperlipidemia Continue simvastatin. Check LFTs.    Hypertension Continue losartan. Monitor blood pressure.    Hypokalemia Replacing. Follow-up potassium levels.   Hyponatremia Continue IV hydration. Monitor sodium levels. Hold furosemide.    Recurrent dislocation of hip joint prosthesis   Hip dislocation, right Continue care as per orthopedic surgery.    Cholecystitis, acute Check right upper quadrant ultrasound. Broad-spectrum antibiotic coverage.    Mediastinal abscess Continue broad-spectrum antibiotic coverage. I would like to thank Dr.Karol [facility] for his input.   Venita Lick, MD was consulted by the emergency department and perform reduction of her right hip dislocation.  Flo Shanks, MD was consulted by the emergency department. Please follow-up his official consultation.  Code Status: Full code. DVT Prophylaxis: Lovenox SQ. Family Communication:  Zelenak,William Spouse [phone] Preferred contact number.   [phone]  Disposition Plan: Admit to stepdown for closer monitoring and treatment of multiple infections.  Time spent: Over 100 minutes.  Bobette Mo [facility] Pager [phone].

## 2015-04-26 NOTE — Anesthesia Preprocedure Evaluation (Addendum)
Anesthesia Evaluation  Patient identified by MRN, date of birth, ID band Patient awake    Reviewed: Allergy & Precautions, H&P , NPO status , Patient's Chart, lab work & pertinent test results  History of Anesthesia Complications Negative for: history of anesthetic complications  Airway Mallampati: II  TM Distance: >3 FB Neck ROM: full    Dental no notable dental hx.    Pulmonary COPD COPD inhaler, Current Smoker,  breath sounds clear to auscultation  Pulmonary exam normal       Cardiovascular hypertension, + CAD, + Past MI and + Cardiac Stents (patient with multiple heart stents, all patent per cath 2010, stress test negative 07/2014, cardiology office visit note with no new changes done 5/16, echo with normal EF, mild-mod MR) Normal cardiovascular examRhythm:regular Rate:Normal     Neuro/Psych PSYCHIATRIC DISORDERS negative neurological ROS     GI/Hepatic negative GI ROS, (+)     substance abuse  ,   Endo/Other  negative endocrine ROSdiabetes  Renal/GU negative Renal ROS     Musculoskeletal   Abdominal   Peds  Hematology negative hematology ROS (+)   Anesthesia Other Findings Significant chronic pain history with MS Contin, oxycodone  NPO appropriate since yesterday, SaO2 100% on 3L [place] currently  Reproductive/Obstetrics negative OB ROS                            Anesthesia Physical Anesthesia Plan  ASA: III and emergent  Anesthesia Plan: General   Post-op Pain Management:    Induction: Intravenous, Rapid sequence and Cricoid pressure planned  Airway Management Planned: Oral ETT  Additional Equipment:   Intra-op Plan:   Post-operative Plan: Extubation in OR  Informed Consent: I have reviewed the patients History and Physical, chart, labs and discussed the procedure including the risks, benefits and alternatives for the proposed anesthesia with the patient or authorized  representative who has indicated his/her understanding and acceptance.     Plan Discussed with: Anesthesiologist, CRNA and Surgeon  Anesthesia Plan Comments: (Given urgent need of case per surgeon will proceed with case with ETT and muscle paralysis to facilitate closed reduction)        Anesthesia Quick Evaluation

## 2015-04-26 NOTE — Progress Notes (Signed)
ANTIBIOTIC CONSULT NOTE - INITIAL  Pharmacy Consult for vancomycin/aztreonam Indication: Pneumonia  Allergies  Allergen Reactions  . Zithromax [Azithromycin Dihydrate]     ORAL ULCERS   . Penicillins Rash    Patient Measurements: Height: 5\' 5"  (165.1 cm) Weight: 133 lb (60.328 kg) IBW/kg (Calculated) : 57 Adjusted Body Weight:   Vital Signs: Temp: 102.9 F (39.4 C) (08/11 1545) Temp Source: Oral (08/11 1545) BP: 107/39 mmHg (08/11 1804) Pulse Rate: 89 (08/11 1804) Intake/Output from previous day:   Intake/Output from this shift:    Labs:  Recent Labs  04/26/15 1633  WBC 12.8*  HGB 10.0*  PLT 232  CREATININE 1.12*   Estimated Creatinine Clearance: 43.9 mL/min (by C-G formula based on Cr of 1.12). No results for input(s): VANCOTROUGH, VANCOPEAK, VANCORANDOM, GENTTROUGH, GENTPEAK, GENTRANDOM, TOBRATROUGH, TOBRAPEAK, TOBRARND, AMIKACINPEAK, AMIKACINTROU, AMIKACIN in the last 72 hours.   Microbiology: No results found for this or any previous visit (from the past 720 hour(s)).  Medical History: Past Medical History  Diagnosis Date  . Myocardial infarction 2000  . Diabetes mellitus type 2, insulin dependent   . COPD (chronic obstructive pulmonary disease)   . Hyperlipidemia   . OA (osteoarthritis) of knee   . Hypertension   . Coronary artery disease   . Asthma   . Elevated LFTs   . ETOH abuse   . Tobacco abuse   . Osteoarthritis   . Breast cancer    Assessment: [name] presents with dislocated hip and fever.  Per ED notes taking levofloxacin for pneumonia.  Since she is remains febrile w/ labored breathing, antibiotics escalated to vancomycin/aztreonam although no obvious risk factors for HCAP.    8/11 >> vancomycin  >> 8/11 >> aztreonam >>    8/11 blood: 8/11 urine:   Renal: SCr is elevated above baseline  WBC mildly elevated + fever  Dose changes/levels:  Goal of Therapy:  Vancomycin trough level 15-20 mcg/ml  Plan:   Vancomycin 1gm IV x 1  then 500mg  IV q12h  Monitor renal function trend and adjust dose as indicated  Check trough if remains on vancomycin   Aztreonam 1gm IV q8h  De-escalate when appropriate per cultures and clinical status  Doreene Eland, PharmD, BCPS.   Pager: [phone] 04/26/2015,6:16 PM

## 2015-04-26 NOTE — Transfer of Care (Signed)
Immediate Anesthesia Transfer of Care Note  Patient: [name]  Procedure(s) Performed: Procedure(s): CLOSED REDUCTION HIP (Right)  Patient Location: PACU  Anesthesia Type:General  Level of Consciousness: awake, sedated and responds to stimulation  Airway & Oxygen Therapy: Patient Spontanous Breathing and Patient connected to face mask oxygen  Post-op Assessment: Report given to RN, Post -op Vital signs reviewed and stable and Blood pressure low (85 systolic) as noted preop.  Post vital signs: Reviewed  Last Vitals:  Filed Vitals:   04/26/15 1839  BP: 105/51  Pulse: 78  Temp:   Resp: 10    Complications: No apparent anesthesia complications

## 2015-04-26 NOTE — Consult Note (Signed)
Anthoney Harada, MD Chief Complaint: Right hip dislocation History: Patient with atraumatic right hip dislocation.  Previous episode 6/28 and 7/11.  No evidence of femur fracture.  Unable to ambulate with gross deformity of the right hip.    Past Medical History  Diagnosis Date  . Myocardial infarction 2000  . Diabetes mellitus type 2, insulin dependent   . COPD (chronic obstructive pulmonary disease)   . Hyperlipidemia   . OA (osteoarthritis) of knee   . Hypertension   . Coronary artery disease   . Asthma   . Elevated LFTs   . ETOH abuse   . Tobacco abuse   . Osteoarthritis   . Breast cancer     Allergies  Allergen Reactions  . Zithromax [Azithromycin Dihydrate]     ORAL ULCERS   . Penicillins Rash    No current facility-administered medications on file prior to encounter.   Current Outpatient Prescriptions on File Prior to Encounter  Medication Sig Dispense Refill  . albuterol (PROVENTIL HFA;VENTOLIN HFA) 108 (90 BASE) MCG/ACT inhaler Inhale 2-4 puffs into the lungs every 6 (six) hours as needed for wheezing (wheezing).     Marland Kitchen albuterol (PROVENTIL) (2.5 MG/3ML) 0.083% nebulizer solution Take 2.5 mg by nebulization every 6 (six) hours as needed.      . ALPRAZolam (XANAX) 0.25 MG tablet Take one tablet daily or as needed (Patient taking differently: Take 0.25 mg by mouth daily as needed for anxiety. Take one tablet daily or as needed) 90 tablet 3  . aspirin EC 81 MG tablet Take 1 tablet (81 mg total) by mouth daily. 30 tablet 0  . diclofenac (VOLTAREN) 75 MG EC tablet Take 75 mg by mouth 2 (two) times daily as needed for mild pain or moderate pain (pain).     . furosemide (LASIX) 40 MG tablet Take 1 tablet (40 mg total) by mouth 2 (two) times daily. (Patient taking differently: Take 40 mg by mouth daily. ) 180 tablet 3  . gabapentin (NEURONTIN) 600 MG tablet Take 600 mg by mouth 3 (three) times daily.     . Ipratropium Bromide HFA (ATROVENT HFA IN) Inhale 17 mcg into the  lungs at bedtime.     Marland Kitchen JANUVIA 25 MG tablet Take 12.5 mg by mouth daily.     Marland Kitchen morphine (MS CONTIN) 30 MG 12 hr tablet Take 30 mg by mouth 4 (four) times daily.   0  . oxyCODONE (ROXICODONE) 15 MG immediate release tablet Take 15 mg by mouth every 6 (six) hours as needed for pain.     . simvastatin (ZOCOR) 10 MG tablet Take 10 mg by mouth every evening.    . nitroGLYCERIN (NITROSTAT) 0.4 MG SL tablet Place 1 tablet (0.4 mg total) under the tongue as needed. (Patient not taking: Reported on 04/26/2015) 90 tablet 6    Physical Exam: Filed Vitals:   04/26/15 1839  BP: 105/51  Pulse: 78  Temp:   Resp: 10   Patient is A+OX3 - sleeping No CP/SOB - currently on nasal O2 4L Abd soft/NT 2+ DP/PT pulses Compartments soft/NT NO deformity of femur/tibia.  Knee -NT no swelling Obvious posterior superior dislocation of the right hip.  No laceration/abrasion noted EHL/TA/GA intact.  Sensation to LT intact thoughout  Image: Dg Pelvis Portable  04/26/2015   CLINICAL DATA:  Right hip dislocation.  EXAM: PORTABLE PELVIS 1-2 VIEWS  COMPARISON:  03/26/2015  FINDINGS: The right hip prosthesis is dislocated with the femoral component displacing superior to the acetabular component.  The prosthetic components remain well seated. There is no acute fracture.  Left hip prosthesis is well-seated and aligned.  Bones are demineralized.  IMPRESSION: Dislocated right hip prosthesis. No prosthetic loosening or evidence of a fracture.   Electronically Signed   By: Lajean Manes M.D.   On: 04/26/2015 17:00    A/P: Patient bent over today and felt a pop in the right hip.  Positive defomity and inability to ambulate.  Seen in ER and diagnosed with hip dislocation.  Patient with recurrent right hip dislocation.  Patient with COPD/empysema and SOB at present.   Neurologically intact with adequate pulses.  No gross deformity of the femur.  Compartments are soft and non-tender. Given pulmonary issues I think it is in her best  interest to perform reduction in the OR.  Patient can be adequately sedated and monitored.  Spoke with medical team for admission given pulmonary issues.   Explained risks/benefits to the patient and all questions addressed.   Will inform Dr Alvan Dame so he can evaluate her in the AM.

## 2015-04-26 NOTE — Anesthesia Procedure Notes (Signed)
Procedure Name: Intubation Date/Time: 04/26/2015 7:49 PM Performed by: Lollie Sails Pre-anesthesia Checklist: Patient identified, Emergency Drugs available, Suction available, Patient being monitored and Timeout performed Patient Re-evaluated:Patient Re-evaluated prior to inductionOxygen Delivery Method: Circle system utilized Preoxygenation: Pre-oxygenation with 100% oxygen Intubation Type: IV induction Ventilation: Mask ventilation without difficulty Laryngoscope Size: Miller and 2 Grade View: Grade I Tube type: Oral Tube size: 7.5 mm Number of attempts: 1 Airway Equipment and Method: Stylet Placement Confirmation: ETT inserted through vocal cords under direct vision,  positive ETCO2 and breath sounds checked- equal and bilateral Secured at: 21 cm Tube secured with: Tape Dental Injury: Teeth and Oropharynx as per pre-operative assessment

## 2015-04-26 NOTE — Progress Notes (Deleted)
Patient ID: [name], female   DOB: 1946/01/07, 69 y.o.   MRN: [id number] placeholder

## 2015-04-27 ENCOUNTER — Inpatient Hospital Stay (HOSPITAL_COMMUNITY): Payer: Medicare Other

## 2015-04-27 ENCOUNTER — Encounter (HOSPITAL_COMMUNITY): Payer: Self-pay | Admitting: Radiology

## 2015-04-27 DIAGNOSIS — J449 Chronic obstructive pulmonary disease, unspecified: Secondary | ICD-10-CM

## 2015-04-27 DIAGNOSIS — J189 Pneumonia, unspecified organism: Secondary | ICD-10-CM

## 2015-04-27 DIAGNOSIS — J853 Abscess of mediastinum: Secondary | ICD-10-CM

## 2015-04-27 LAB — CBC WITH DIFFERENTIAL/PLATELET
BASOS ABS: 0 10*3/uL (ref 0.0–0.1)
BASOS PCT: 0 % (ref 0–1)
Eosinophils Absolute: 0 10*3/uL (ref 0.0–0.7)
Eosinophils Relative: 0 % (ref 0–5)
HCT: 27.5 % — ABNORMAL LOW (ref 36.0–46.0)
Hemoglobin: 8.9 g/dL — ABNORMAL LOW (ref 12.0–15.0)
Lymphocytes Relative: 11 % — ABNORMAL LOW (ref 12–46)
Lymphs Abs: 1.1 10*3/uL (ref 0.7–4.0)
MCH: 27.1 pg (ref 26.0–34.0)
MCHC: 32.4 g/dL (ref 30.0–36.0)
MCV: 83.6 fL (ref 78.0–100.0)
MONO ABS: 1 10*3/uL (ref 0.1–1.0)
Monocytes Relative: 11 % (ref 3–12)
NEUTROS PCT: 78 % — AB (ref 43–77)
Neutro Abs: 7.3 10*3/uL (ref 1.7–7.7)
PLATELETS: 181 10*3/uL (ref 150–400)
RBC: 3.29 MIL/uL — AB (ref 3.87–5.11)
RDW: 14.7 % (ref 11.5–15.5)
WBC: 9.4 10*3/uL (ref 4.0–10.5)

## 2015-04-27 LAB — STREP PNEUMONIAE URINARY ANTIGEN: Strep Pneumo Urinary Antigen: NEGATIVE

## 2015-04-27 LAB — MAGNESIUM: Magnesium: 1.5 mg/dL — ABNORMAL LOW (ref 1.7–2.4)

## 2015-04-27 LAB — COMPREHENSIVE METABOLIC PANEL
ALBUMIN: 2.3 g/dL — AB (ref 3.5–5.0)
ALK PHOS: 87 U/L (ref 38–126)
ALT: 9 U/L — AB (ref 14–54)
AST: 16 U/L (ref 15–41)
Anion gap: 7 (ref 5–15)
BUN: 12 mg/dL (ref 6–20)
CO2: 29 mmol/L (ref 22–32)
Calcium: 7.9 mg/dL — ABNORMAL LOW (ref 8.9–10.3)
Chloride: 97 mmol/L — ABNORMAL LOW (ref 101–111)
Creatinine, Ser: 0.55 mg/dL (ref 0.44–1.00)
GFR calc non Af Amer: >60 mL/min (ref >60)
GLUCOSE: 110 mg/dL — AB (ref 65–99)
POTASSIUM: 2.9 mmol/L — AB (ref 3.5–5.1)
SODIUM: 133 mmol/L — AB (ref 135–145)
Total Bilirubin: 0.8 mg/dL (ref 0.3–1.2)
Total Protein: 5.8 g/dL — ABNORMAL LOW (ref 6.5–8.1)

## 2015-04-27 LAB — PHOSPHORUS: Phosphorus: 2.3 mg/dL — ABNORMAL LOW (ref 2.5–4.6)

## 2015-04-27 LAB — GLUCOSE, CAPILLARY
GLUCOSE-CAPILLARY: 102 mg/dL — AB (ref 65–99)
GLUCOSE-CAPILLARY: 85 mg/dL (ref 65–99)
Glucose-Capillary: 97 mg/dL (ref 65–99)
Glucose-Capillary: 101 mg/dL — ABNORMAL HIGH (ref 65–99)

## 2015-04-27 LAB — MRSA PCR SCREENING: MRSA BY PCR: NEGATIVE

## 2015-04-27 LAB — HIV ANTIBODY (ROUTINE TESTING W REFLEX): HIV Screen 4th Generation wRfx: NONREACTIVE

## 2015-04-27 MED ORDER — CIPROFLOXACIN IN D5W 400 MG/200ML IV SOLN
400.0000 mg | Freq: Two times a day (BID) | INTRAVENOUS | Status: DC
  Administered 2015-04-27 – 2015-04-28 (×2): 400 mg via INTRAVENOUS
  Filled 2015-04-27 (×2): qty 200

## 2015-04-27 MED ORDER — IPRATROPIUM-ALBUTEROL 0.5-2.5 (3) MG/3ML IN SOLN
3.0000 mL | Freq: Four times a day (QID) | RESPIRATORY_TRACT | Status: DC
  Administered 2015-04-27 – 2015-04-29 (×6): 3 mL via RESPIRATORY_TRACT
  Filled 2015-04-27 (×7): qty 3

## 2015-04-27 MED ORDER — IPRATROPIUM BROMIDE 0.02 % IN SOLN: 2.5000 mL | Freq: Four times a day (QID) | RESPIRATORY_TRACT | Status: DC | PRN

## 2015-04-27 MED ORDER — VANCOMYCIN HCL 500 MG IV SOLR
500.0000 mg | Freq: Two times a day (BID) | INTRAVENOUS | Status: DC
  Administered 2015-04-27 – 2015-04-28 (×4): 500 mg via INTRAVENOUS
  Filled 2015-04-27 (×4): qty 500

## 2015-04-27 MED ORDER — IOHEXOL 300 MG/ML  SOLN
100.0000 mL | Freq: Once | INTRAMUSCULAR | Status: AC | PRN
  Administered 2015-04-27: 100 mL via INTRAVENOUS

## 2015-04-27 MED ORDER — GADOBENATE DIMEGLUMINE 529 MG/ML IV SOLN
15.0000 mL | Freq: Once | INTRAVENOUS | Status: AC | PRN
  Administered 2015-04-27: 12 mL via INTRAVENOUS

## 2015-04-27 MED ORDER — MORPHINE SULFATE ER 30 MG PO TBCR
30.0000 mg | EXTENDED_RELEASE_TABLET | Freq: Two times a day (BID) | ORAL | Status: DC
  Administered 2015-04-28 – 2015-05-02 (×10): 30 mg via ORAL
  Filled 2015-04-27 (×11): qty 1

## 2015-04-27 MED ORDER — INSULIN ASPART 100 UNIT/ML ~~LOC~~ SOLN
0.0000 [IU] | SUBCUTANEOUS | Status: DC
  Administered 2015-04-30: 2 [IU] via SUBCUTANEOUS

## 2015-04-27 MED ORDER — POTASSIUM CHLORIDE 10 MEQ/100ML IV SOLN
10.0000 meq | INTRAVENOUS | Status: AC
  Administered 2015-04-27 (×4): 10 meq via INTRAVENOUS
  Filled 2015-04-27 (×4): qty 100

## 2015-04-27 MED ORDER — MAGNESIUM SULFATE 2 GM/50ML IV SOLN
2.0000 g | Freq: Once | INTRAVENOUS | Status: AC
  Administered 2015-04-27: 2 g via INTRAVENOUS
  Filled 2015-04-27: qty 50

## 2015-04-27 MED ORDER — IOHEXOL 300 MG/ML  SOLN: 150.0000 mL | Freq: Once | INTRAMUSCULAR | Status: DC | PRN

## 2015-04-27 NOTE — Progress Notes (Signed)
PT/OT  Cancellation Note  Patient Details Name: [name] MRN: [id number] DOB: 07-29-1946   Cancelled Treatment:    Reason Eval/Treat Not Completed: Patient not medically ready; Pt just in OR this am for closed reduction hip so will defer OT/PT evals until next day; Will also need clarification of WBing status and any hip precautions; Thank you.    Kenyon Ana, PT Pager: [phone] 04/27/2015  Kenyon Ana 04/27/2015, 11:10 AM  Lesle Chris, OTR/L [phone] 04/27/2015

## 2015-04-27 NOTE — Consult Note (Signed)
[name], G[place]hman 69 y.o., female [id number]     Chief Complaint: swallowing difficulty  HPI: 69 yo wf, DM, Smoker. Hx ant cervical fusion 2010.  Presented to ED yest with hip pain and a dislocated hip prosthesis which was replaced under anesthesia by Dr. Rolena Infante. CT chest at time of admission showed pneumonitis, possibly aspiration.  Also a small enhancing rim loculation lateral to T1 on LEFT c/w abscess.  Febrile, suspected gall bladder disease.    In f/u to CT findings, CT neck performed this AM showing fluid and gas in the prevertebral space from C3 down.  2 level fusion plate.  Severe degenerative arthritis of cervical spine.  Possible osteomyelitis of cervical spine.  Narrowing of spinal canal.  Ba swallow could not be performed based on her dysphagia.  On questioning, pt has had some neck pain, choking and difficulty eating ongoing 3-4 weeks.  No awareness of fever.  No trauma to neck or throat.  PMH: Past Medical History  Diagnosis Date  . Myocardial infarction 2000  . Diabetes mellitus type 2, insulin dependent   . COPD (chronic obstructive pulmonary disease)   . Hyperlipidemia   . OA (osteoarthritis) of knee   . Hypertension   . Coronary artery disease   . Asthma   . Elevated LFTs   . ETOH abuse   . Tobacco abuse   . Osteoarthritis   . Breast cancer     Surg Hx: Past Surgical History  Procedure Laterality Date  . Breast surgery  1991    right mastectomy  . Vulvectomy  1981    partial  . Neck fusion    . Pelvic laparoscopy  2002    RSO  . Cesarean section  '78, '80, '81    x 3  . Hysteroscopy      D & C  . Cardiac catheterization  04/05/2009    EF 60%  . Lobectomy    . Tonsillectomy and adenoidectomy    . Mastectomy    . Coronary angioplasty  08/1998    x2 OF A BIFURCATION OM-1, OM-2 LESION  . Coronary angioplasty with stent placement  01/1999    MID FIRST OBTUSE MARGINAL VESSEL  . Coronary angioplasty with stent placement  07/1999    STENTING AT THE CRUX OF  THE RIGHT CORONARY ARTERY WITH A 3.8MM X 18MM TETRA STENT  . Cardiovascular stress test  01/31/2005    EF 58%  . Total hip arthroplasty  08/2010    bilat    FHx:   Family History  Problem Relation Age of Onset  . Diabetes Mother   . Hypertension Father   . Heart disease Father   . Heart attack Father   . Stroke Father    SocHx:  reports that she has been smoking Cigarettes.  She has a 11.25 pack-year smoking history. She has never used smokeless tobacco. She reports that she does not drink alcohol or use illicit drugs.  ALLERGIES:  Allergies  Allergen Reactions  . Zithromax [Azithromycin Dihydrate]     ORAL ULCERS   . Penicillins Rash    Medications Prior to Admission  Medication Sig Dispense Refill  . albuterol (PROVENTIL HFA;VENTOLIN HFA) 108 (90 BASE) MCG/ACT inhaler Inhale 2-4 puffs into the lungs every 6 (six) hours as needed for wheezing (wheezing).     Marland Kitchen albuterol (PROVENTIL) (2.5 MG/3ML) 0.083% nebulizer solution Take 2.5 mg by nebulization every 6 (six) hours as needed.      . ALPRAZolam (XANAX) 0.25 MG  tablet Take one tablet daily or as needed (Patient taking differently: Take 0.25 mg by mouth daily as needed for anxiety. Take one tablet daily or as needed) 90 tablet 3  . aspirin EC 81 MG tablet Take 1 tablet (81 mg total) by mouth daily. 30 tablet 0  . diclofenac (VOLTAREN) 75 MG EC tablet Take 75 mg by mouth 2 (two) times daily as needed for mild pain or moderate pain (pain).     . furosemide (LASIX) 40 MG tablet Take 1 tablet (40 mg total) by mouth 2 (two) times daily. (Patient taking differently: Take 40 mg by mouth daily. ) 180 tablet 3  . gabapentin (NEURONTIN) 600 MG tablet Take 600 mg by mouth 3 (three) times daily.     . Ipratropium Bromide HFA (ATROVENT HFA IN) Inhale 17 mcg into the lungs at bedtime.     Marland Kitchen JANUVIA 25 MG tablet Take 12.5 mg by mouth daily.     Marland Kitchen losartan (COZAAR) 25 MG tablet Take 25 mg by mouth daily.  1  . morphine (MS CONTIN) 30 MG 12 hr  tablet Take 30 mg by mouth 4 (four) times daily.   0  . oxyCODONE (ROXICODONE) 15 MG immediate release tablet Take 15 mg by mouth every 6 (six) hours as needed for pain.     . simvastatin (ZOCOR) 10 MG tablet Take 10 mg by mouth every evening.    Marland Kitchen tiZANidine (ZANAFLEX) 4 MG tablet Take 4 mg by mouth every 8 (eight) hours as needed for muscle spasms.   0  . nitroGLYCERIN (NITROSTAT) 0.4 MG SL tablet Place 1 tablet (0.4 mg total) under the tongue as needed. (Patient not taking: Reported on 04/26/2015) 90 tablet 6    Results for orders placed or performed during the hospital encounter of 04/26/15 (from the past 48 hour(s))  CBC with Differential/Platelet     Status: Abnormal   Collection Time: 04/26/15  4:33 PM  Result Value Ref Range   WBC 12.8 (H) 4.0 - 10.5 K/uL   RBC 3.65 (L) 3.87 - 5.11 MIL/uL   Hemoglobin 10.0 (L) 12.0 - 15.0 g/dL   HCT 30.6 (L) 36.0 - 46.0 %   MCV 83.8 78.0 - 100.0 fL   MCH 27.4 26.0 - 34.0 pg   MCHC 32.7 30.0 - 36.0 g/dL   RDW 14.8 11.5 - 15.5 %   Platelets 232 150 - 400 K/uL   Neutrophils Relative % 86 (H) 43 - 77 %   Neutro Abs 11.0 (H) 1.7 - 7.7 K/uL   Lymphocytes Relative 4 (L) 12 - 46 %   Lymphs Abs 0.5 (L) 0.7 - 4.0 K/uL   Monocytes Relative 10 3 - 12 %   Monocytes Absolute 1.3 (H) 0.1 - 1.0 K/uL   Eosinophils Relative 0 0 - 5 %   Eosinophils Absolute 0.0 0.0 - 0.7 K/uL   Basophils Relative 0 0 - 1 %   Basophils Absolute 0.0 0.0 - 0.1 K/uL  Basic metabolic panel     Status: Abnormal   Collection Time: 04/26/15  4:33 PM  Result Value Ref Range   Sodium 129 (L) 135 - 145 mmol/L   Potassium 3.2 (L) 3.5 - 5.1 mmol/L   Chloride 89 (L) 101 - 111 mmol/L   CO2 30 22 - 32 mmol/L   Glucose, Bld 154 (H) 65 - 99 mg/dL   BUN 18 6 - 20 mg/dL   Creatinine, Ser 1.12 (H) 0.44 - 1.00 mg/dL   Calcium 8.4 (  L) 8.9 - 10.3 mg/dL   GFR calc non Af Amer 50 (L) >60 mL/min   GFR calc Af Amer 58 (L) >60 mL/min    Comment: (NOTE) The eGFR has been calculated using the CKD  EPI equation. This calculation has not been validated in all clinical situations. eGFR's persistently <60 mL/min signify possible Chronic Kidney Disease.    Anion gap 10 5 - 15  I-Stat CG4 Lactic Acid, ED  (not at  [facility])     Status: None   Collection Time: 04/26/15  5:40 PM  Result Value Ref Range   Lactic Acid, Venous 1.30 0.5 - 2.0 mmol/L  Urinalysis, Routine w reflex microscopic (not at [facility])     Status: Abnormal   Collection Time: 04/26/15  5:48 PM  Result Value Ref Range   Color, Urine AMBER (A) YELLOW    Comment: BIOCHEMICALS MAY BE AFFECTED BY COLOR   APPearance CLOUDY (A) CLEAR   Specific Gravity, Urine 1.020 1.005 - 1.030   pH 6.0 5.0 - 8.0   Glucose, UA NEGATIVE NEGATIVE mg/dL   Hgb urine dipstick MODERATE (A) NEGATIVE   Bilirubin Urine SMALL (A) NEGATIVE   Ketones, ur NEGATIVE NEGATIVE mg/dL   Protein, ur 100 (A) NEGATIVE mg/dL   Urobilinogen, UA 1.0 0.0 - 1.0 mg/dL   Nitrite NEGATIVE NEGATIVE   Leukocytes, UA TRACE (A) NEGATIVE  Urine culture     Status: None (Preliminary result)   Collection Time: 04/26/15  5:48 PM  Result Value Ref Range   Specimen Description URINE, CLEAN CATCH    Special Requests NONE    Culture      NO GROWTH < 24 HOURS Performed at [facility]    Report Status PENDING   Urine microscopic-add on     Status: Abnormal   Collection Time: 04/26/15  5:48 PM  Result Value Ref Range   Squamous Epithelial / LPF FEW (A) RARE   WBC, UA 0-2 <3 WBC/hpf   RBC / HPF 3-6 <3 RBC/hpf   Casts HYALINE CASTS (A) NEGATIVE   Urine-Other AMORPHOUS URATES/PHOSPHATES   Glucose, capillary     Status: Abnormal   Collection Time: 04/26/15  9:01 PM  Result Value Ref Range   Glucose-Capillary 107 (H) 65 - 99 mg/dL   Comment 1 Notify RN   MRSA PCR Screening     Status: None   Collection Time: 04/27/15 12:25 AM  Result Value Ref Range   MRSA by PCR NEGATIVE NEGATIVE    Comment:        The GeneXpert MRSA Assay (FDA approved for NASAL specimens only),  is one component of a comprehensive MRSA colonization surveillance program. It is not intended to diagnose MRSA infection nor to guide or monitor treatment for MRSA infections.   Strep pneumoniae urinary antigen     Status: None   Collection Time: 04/27/15 12:31 AM  Result Value Ref Range   Strep Pneumo Urinary Antigen NEGATIVE NEGATIVE    Comment:        Infection due to S. pneumoniae cannot be absolutely ruled out since the antigen present may be below the detection limit of the test. Performed at [facility]     Status: Abnormal   Collection Time: 04/27/15  3:20 AM  Result Value Ref Range   WBC 9.4 4.0 - 10.5 K/uL   RBC 3.29 (L) 3.87 - 5.11 MIL/uL   Hemoglobin 8.9 (L) 12.0 - 15.0 g/dL   HCT 27.5 (L)  36.0 - 46.0 %   MCV 83.6 78.0 - 100.0 fL   MCH 27.1 26.0 - 34.0 pg   MCHC 32.4 30.0 - 36.0 g/dL   RDW 14.7 11.5 - 15.5 %   Platelets 181 150 - 400 K/uL   Neutrophils Relative % 78 (H) 43 - 77 %   Neutro Abs 7.3 1.7 - 7.7 K/uL   Lymphocytes Relative 11 (L) 12 - 46 %   Lymphs Abs 1.1 0.7 - 4.0 K/uL   Monocytes Relative 11 3 - 12 %   Monocytes Absolute 1.0 0.1 - 1.0 K/uL   Eosinophils Relative 0 0 - 5 %   Eosinophils Absolute 0.0 0.0 - 0.7 K/uL   Basophils Relative 0 0 - 1 %   Basophils Absolute 0.0 0.0 - 0.1 K/uL  Comprehensive metabolic panel     Status: Abnormal   Collection Time: 04/27/15  3:20 AM  Result Value Ref Range   Sodium 133 (L) 135 - 145 mmol/L   Potassium 2.9 (L) 3.5 - 5.1 mmol/L   Chloride 97 (L) 101 - 111 mmol/L   CO2 29 22 - 32 mmol/L   Glucose, Bld 110 (H) 65 - 99 mg/dL   BUN 12 6 - 20 mg/dL   Creatinine, Ser 0.55 0.44 - 1.00 mg/dL   Calcium 7.9 (L) 8.9 - 10.3 mg/dL   Total Protein 5.8 (L) 6.5 - 8.1 g/dL   Albumin 2.3 (L) 3.5 - 5.0 g/dL   AST 16 15 - 41 U/L   ALT 9 (L) 14 - 54 U/L   Alkaline Phosphatase 87 38 - 126 U/L   Total Bilirubin 0.8 0.3 - 1.2 mg/dL   GFR calc non Af Amer >60 >60 mL/min   GFR calc Af Amer  >60 >60 mL/min    Comment: (NOTE) The eGFR has been calculated using the CKD EPI equation. This calculation has not been validated in all clinical situations. eGFR's persistently <60 mL/min signify possible Chronic Kidney Disease.    Anion gap 7 5 - 15  Magnesium     Status: Abnormal   Collection Time: 04/27/15  3:20 AM  Result Value Ref Range   Magnesium 1.5 (L) 1.7 - 2.4 mg/dL  Phosphorus     Status: Abnormal   Collection Time: 04/27/15  3:20 AM  Result Value Ref Range   Phosphorus 2.3 (L) 2.5 - 4.6 mg/dL  Glucose, capillary     Status: None   Collection Time: 04/27/15  8:06 AM  Result Value Ref Range   Glucose-Capillary 97 65 - 99 mg/dL   Comment 1 Notify RN    Comment 2 Document in Chart    Ct Soft Tissue Neck W Contrast  04/27/2015   CLINICAL DATA:  Paravertebral abscess. Abnormal CT chest yesterday. Cervical fusion 2010  EXAM: CT NECK WITH CONTRAST  TECHNIQUE: Multidetector CT imaging of the neck was performed using the standard protocol following the bolus administration of intravenous contrast.  CONTRAST:  156m OMNIPAQUE IOHEXOL 300 MG/ML  SOLN  COMPARISON:  CT chest 04/26/2015  FINDINGS: Pharynx and larynx: Normal tongue and oropharynx. Normal larynx. Extensive prevertebral soft tissue swelling and gas bubbles. Prevertebral swelling and fluid begins at approximately C2 and extends to T1. Rim enhancing fluid collection to the left of T1 compatible with abscess as noted on chest CT.  Salivary glands: Negative  Thyroid: Negative  Lymph nodes: Negative for adenopathy.  Vascular: Carotid artery patent bilaterally with mild atherosclerotic disease. Jugular vein patent bilaterally without thrombus.  Limited intracranial:  Negative  Visualized orbits: Not imaged  Mastoids and visualized paranasal sinuses: Clear  Skeleton: ACDF C4 through C6. Anterior plate in good position. Solid fusion with strut graft. No bony destruction at these levels. There is spondylosis at C4-5 causing spinal and  foraminal stenosis. There is also foraminal encroachment bilaterally at C5-6 due to spurring.  4 mm retrolisthesis C3-4 with disc degeneration and spurring. Severe spinal stenosis. Endplates are irregular. Osteomyelitis and discitis not excluded at this level as a cause for infection. Cervical spine MRI would be helpful to evaluate for spinal infection and to exclude epidural abscess.  Upper chest: 6 mm right upper lobe nodule again noted and unchanged. Small left pleural effusion.  IMPRESSION: Extensive prevertebral edema and fluid mixed with gas bubbles. This is anterior to anterior plate fusion of C4 through C7. This is most likely arising from the spine. The infection could be arising from an infected fusion plate or possibly from discitis and osteomyelitis at C3-4. There is endplate irregularity at C3-4 with prominent posterior osteophyte formation. There is severe spinal stenosis at C3-4 and moderate to severe spinal stenosis at C4-5 due to spurring. 15 mm abscess to the left of T1.  MRI cervical spine without and with contrast suggested to exclude osteomyelitis of the spine and rule out epidural abscess.  These results were called by telephone at the time of interpretation on 04/27/2015 at 9:18 am to Dr. Jodi Marble , who verbally acknowledged these results.   Electronically Signed   By: Franchot Gallo M.D.   On: 04/27/2015 09:08   Ct Chest W Contrast  04/26/2015   CLINICAL DATA:  Fever and cough.  Evaluate for pneumonia.  EXAM: CT CHEST WITH CONTRAST  TECHNIQUE: Multidetector CT imaging of the chest was performed during intravenous contrast administration.  CONTRAST:  68m OMNIPAQUE IOHEXOL 300 MG/ML  SOLN  COMPARISON:  Chest radiograph 04/04/2015.  Chest CT 08/29/2014  FINDINGS: There is abnormal soft tissue in the lower neck along the left side of the thyroid gland and surrounding the upper esophagus. There is a small focal low-density structure along the left anterior aspect of C7 and T1. This  collection has a small focus of gas within it and measures 1.1 x 0.9 x 2.0 cm. This collection is compatible with a soft tissue abscess. There is no significant fat plane between the proximal esophagus and the soft tissue abscess. Soft tissues in the left subcarinal region and distal esophageal region are indistinct. Difficult to exclude inflammatory changes around the distal esophageal region, particularly on sequence 2, image 31.  There are coronary artery calcifications. The gallbladder is distended and there is at least mild intrahepatic biliary dilatation. Limited evaluation of the anterior right liver due to artifact and motion. There may be abnormal enhancement along the anterior right hepatic lobe. In addition, there are slightly prominent lymph nodes in the upper abdomen and the left periaortic region. Some of these nodes were present on the exam from 2012.  The trachea and mainstem bronchi are patent. There appears chronic or recurrent filling defects in the left lower lobe bronchus and left lower lobe bronchi. There is chronic volume loss in the left lower lobe. Chronic patchy parenchymal densities in left lower lobe. Linear densities in medial right lower lobe could represent scar or atelectasis. There is a chronic of sub solid nodular structure in the anterior right upper lobe on sequence 4, image 18 measuring 5 mm. This has not significantly changed since 2012.  There is an anterior  surgical plate in the cervical spine extending down to C6. There is no evidence for bone destruction involving the lower cervical spine or upper thoracic spine adjacent to the area of inflammation and abscess collection.  IMPRESSION: There is abnormal soft tissue in the lower neck and upper posterior mediastinum which likely represents edema and an inflammatory process. There is a small air-fluid collection along the left side of the cervical-thoracic junction which is compatible with an abscess. There is no clear evidence  for bone destruction. The source for this inflammatory process is unclear. Upper esophagus is poorly characterized.  There are endobronchial filling defects in the left lower lobe airways. Similar findings were present on prior examinations and this could represent an area of recurrent mucous plugging or aspiration. There is some chronic volume loss in the left lower lobe.  The soft tissue along the mid and distal esophagus is indistinct. Difficult to exclude an inflammatory process in this region.  Gallbladder distension with at least mild intrahepatic biliary dilatation. Difficult to exclude abnormal enhancement along the anterior right hepatic lobe. This area could be further characterized with ultrasound.  Critical Value/emergent results were called by telephone at the time of interpretation on 04/26/2015 at 7:47 pm to Dr. Evelina Bucy , who verbally acknowledged these results.   Electronically Signed   By: Markus Daft M.D.   On: 04/26/2015 20:03   Dg Pelvis Portable  04/26/2015   CLINICAL DATA:  Right hip dislocation.  EXAM: PORTABLE PELVIS 1-2 VIEWS  COMPARISON:  03/26/2015  FINDINGS: The right hip prosthesis is dislocated with the femoral component displacing superior to the acetabular component. The prosthetic components remain well seated. There is no acute fracture.  Left hip prosthesis is well-seated and aligned.  Bones are demineralized.  IMPRESSION: Dislocated right hip prosthesis. No prosthetic loosening or evidence of a fracture.   Electronically Signed   By: Lajean Manes M.D.   On: 04/26/2015 17:00   Dg Hip Port Unilat With Pelvis 1v Right  04/26/2015   CLINICAL DATA:  69 year old female with right hip arthroplasty complicated by recent right hip dislocation status post right hip reduction.  EXAM: DG HIP (WITH OR WITHOUT PELVIS) 1V PORT RIGHT  COMPARISON:  04/26/2015.  FINDINGS: Postoperative changes of right total hip arthroplasty again noted. The prosthetic femoral head has been successfully  relocated within the prosthetic acetabulum. No periprosthetic fractures are identified. Iodinated contrast material in the urinary bladder incidentally noted, presumably related to recent contrast enhanced chest CT.  IMPRESSION: 1. Successful relocation of right hip, without acute complicating features.   Electronically Signed   By: Vinnie Langton M.D.   On: 04/26/2015 20:19   US Abdomen Limited Ruq  04/27/2015   CLINICAL DATA:  Cholecystitis, incidental finding of gallbladder distension on recent emergency room workup  EXAM: US ABDOMEN LIMITED - RIGHT UPPER QUADRANT  COMPARISON:  None.  FINDINGS: Gallbladder:  Distended gallbladder. Echogenic foci along the gallbladder wall without shadowing which may reflect gallbladder sludge. Unable to place the patient in decubitus position. No gallbladder wall thickening or pericholecystic fluid. No sonographic Murphy sign noted.  Common bile duct:  Diameter: 5.7 mm  Liver:  No focal hepatic mass. Mild intrahepatic biliary ductal dilatation. Small amount of perihepatic fluid. Otherwise normal hepatic echogenicity.  IMPRESSION: 1. Gallbladder distention and mild intrahepatic biliary ductal dilatation. No sonographic findings to suggest acute cholecystitis. 2. Small amount of echogenic material in the gallbladder dependently which may reflect gallbladder sludge. 3. Small amount of perihepatic fluid.  Electronically Signed   By: Kathreen Devoid   On: 04/27/2015 09:35      Blood pressure 95/45, pulse 59, temperature 98.1 F (36.7 C), temperature source Oral, resp. rate 15, height '5\' 2"'  (1.575 m), weight 59.3 kg (130 lb 11.7 oz), SpO2 98 %.  PHYSICAL EXAM: Overall appearance: pale, tired. Easily arousable.  Mentally somewhat slow and history difficult to obtain.  Breathing comfortable through nose.  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  Head:  NCAT  Ears: skin flakes in canal AU Nose: clear Oral Cavity:  Upper and lower plates.   Oral Pharynx/Hypopharynx/Larynx:   Oropharynx clear.  Per flexible laryngoscopy, NP clear.  OP clear.  Vocal cords mobile with intact supraglottis and OK airway.  Prominence of retropharyngeal tissue impinging on arytenoids. Neuro:  Grossly intact Neck:  Old well healed LEFT ACDF scar.  Abnormal absence of laryngeal crepitus.  No subQ emphysema.  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  Concern for possible osteomyelitis or even involvement of spinal canal.  Cannot assess for possible esophageal perforation.  This could certainly explain fever,  Recurrent aspiration.  I have discussed with Dr. Rolena Infante who performed the ACDF.  We have ordered a STAT MRI cervical spine.  Will need antibiotic management, and probably neck exploration with wide drainage and possibly removal of fusion plate.  We will be available to assist Orthopedists with neck exploration as needed.  Should have esophagoscopy at time of neck exploration to evaluate integrity of esophagus in absence of info from swallowing x-rays.  Jodi Marble 03/06/6332, 12:29 PM

## 2015-04-27 NOTE — Progress Notes (Signed)
Patient states she has not been using an mdi at home, but does use a nebulizer scheduled with both albuterol and ipatroprium bromide. However, she does not wish to take any breathing medication at this time. When asked if she feels the need for BD, she states "No, I just want to find my glasses so I can see and get some sleep". RT protocol assessment complete. Orders changed/placed according to patient home regimen and acuity score. RT will continue to follow.

## 2015-04-27 NOTE — Progress Notes (Signed)
[facility] PROGRESS NOTE  [name] ZDG:[id number] DOB: October 01, 1945 DOA: 04/26/2015  PCP: Redmond Baseman, MD  Brief HPI: 69 year old Caucasian female with a past medical history of coronary artery disease, insulin-dependent diabetes, COPD, history of breast cancer presented with right hip pain. She was noted to have a right hip prosthesis dislocation. She was also noted to have a fever. She was recently treated for pneumonia with Levaquin. CT scan of the chest revealed fluid collection in the lower neck and upper chest area. Patient was hospitalized for further management.  Past medical history:  Past Medical History  Diagnosis Date  . Myocardial infarction 2000  . Diabetes mellitus type 2, insulin dependent   . COPD (chronic obstructive pulmonary disease)   . Hyperlipidemia   . OA (osteoarthritis) of knee   . Hypertension   . Coronary artery disease   . Asthma   . Elevated LFTs   . ETOH abuse   . Tobacco abuse   . Osteoarthritis   . Breast cancer     Consultants: Orthopedics, ENT  Procedures: Closed reduction of the right hip dislocation 8/12  Antibiotics: Vancomycin, aztreonam, ciprofloxacin 8/11  Subjective: Patient complains of generalized weakness. Right hip pain is present but better. Denies any chest pain. No shortness of breath.  Objective: Vital Signs  Filed Vitals:   04/27/15 0400 04/27/15 0500 04/27/15 0600 04/27/15 0700  BP: 89/42 93/44 105/37 106/48  Pulse: 67 67 80 73  Temp:      TempSrc:      Resp: 17 16 21 21   Height:      Weight:      SpO2: 97% 98% 92% 88%    Intake/Output Summary (Last 24 hours) at 04/27/15 0753 Last data filed at 04/27/15 5956  Gross per 24 hour  Intake 2118.75 ml  Output    800 ml  Net 1318.75 ml   Filed Weights   04/26/15 1624 04/27/15 0000  Weight: 60.328 kg (133 lb) 59.3 kg (130 lb 11.7 oz)    General appearance: Sleepy but arousable..In no distress. Head: Normocephalic, without obvious  abnormality, atraumatic Resp: Diminished air entry at the bases. No definite crackles or wheezing. Cardio: regular rate and rhythm, S1, S2 normal, no murmur, click, rub or gallop GI: soft, non-tender; bowel sounds normal; no masses,  no organomegaly Extremities: extremities normal, atraumatic, no cyanosis or edema Pulses: 2+ and symmetric Neurologic: No focal deficits  Lab Results:  Basic Metabolic Panel:  Recent Labs Lab 04/26/15 1633 04/27/15 0320  NA 129* 133*  K 3.2* 2.9*  CL 89* 97*  CO2 30 29  GLUCOSE 154* 110*  BUN 18 12  CREATININE 1.12* 0.55  CALCIUM 8.4* 7.9*  MG  --  1.5*  PHOS  --  2.3*   Liver Function Tests:  Recent Labs Lab 04/27/15 0320  AST 16  ALT 9*  ALKPHOS 87  BILITOT 0.8  PROT 5.8*  ALBUMIN 2.3*   CBC:  Recent Labs Lab 04/26/15 1633 04/27/15 0320  WBC 12.8* 9.4  NEUTROABS 11.0* 7.3  HGB 10.0* 8.9*  HCT 30.6* 27.5*  MCV 83.8 83.6  PLT 232 181    CBG:  Recent Labs Lab 04/26/15 2101  GLUCAP 107*    Recent Results (from the past 240 hour(s))  MRSA PCR Screening     Status: None   Collection Time: 04/27/15 12:25 AM  Result Value Ref Range Status   MRSA by PCR NEGATIVE NEGATIVE Final    Comment:  The GeneXpert MRSA Assay (FDA approved for NASAL specimens only), is one component of a comprehensive MRSA colonization surveillance program. It is not intended to diagnose MRSA infection nor to guide or monitor treatment for MRSA infections.       Studies/Results: Ct Chest W Contrast  04/26/2015   CLINICAL DATA:  Fever and cough.  Evaluate for pneumonia.  EXAM: CT CHEST WITH CONTRAST  TECHNIQUE: Multidetector CT imaging of the chest was performed during intravenous contrast administration.  CONTRAST:  80mL OMNIPAQUE IOHEXOL 300 MG/ML  SOLN  COMPARISON:  Chest radiograph 04/04/2015.  Chest CT 08/29/2014  FINDINGS: There is abnormal soft tissue in the lower neck along the left side of the thyroid gland and surrounding the  upper esophagus. There is a small focal low-density structure along the left anterior aspect of C7 and T1. This collection has a small focus of gas within it and measures 1.1 x 0.9 x 2.0 cm. This collection is compatible with a soft tissue abscess. There is no significant fat plane between the proximal esophagus and the soft tissue abscess. Soft tissues in the left subcarinal region and distal esophageal region are indistinct. Difficult to exclude inflammatory changes around the distal esophageal region, particularly on sequence 2, image 31.  There are coronary artery calcifications. The gallbladder is distended and there is at least mild intrahepatic biliary dilatation. Limited evaluation of the anterior right liver due to artifact and motion. There may be abnormal enhancement along the anterior right hepatic lobe. In addition, there are slightly prominent lymph nodes in the upper abdomen and the left periaortic region. Some of these nodes were present on the exam from 2012.  The trachea and mainstem bronchi are patent. There appears chronic or recurrent filling defects in the left lower lobe bronchus and left lower lobe bronchi. There is chronic volume loss in the left lower lobe. Chronic patchy parenchymal densities in left lower lobe. Linear densities in medial right lower lobe could represent scar or atelectasis. There is a chronic of sub solid nodular structure in the anterior right upper lobe on sequence 4, image 18 measuring 5 mm. This has not significantly changed since 2012.  There is an anterior surgical plate in the cervical spine extending down to C6. There is no evidence for bone destruction involving the lower cervical spine or upper thoracic spine adjacent to the area of inflammation and abscess collection.  IMPRESSION: There is abnormal soft tissue in the lower neck and upper posterior mediastinum which likely represents edema and an inflammatory process. There is a small air-fluid collection along  the left side of the cervical-thoracic junction which is compatible with an abscess. There is no clear evidence for bone destruction. The source for this inflammatory process is unclear. Upper esophagus is poorly characterized.  There are endobronchial filling defects in the left lower lobe airways. Similar findings were present on prior examinations and this could represent an area of recurrent mucous plugging or aspiration. There is some chronic volume loss in the left lower lobe.  The soft tissue along the mid and distal esophagus is indistinct. Difficult to exclude an inflammatory process in this region.  Gallbladder distension with at least mild intrahepatic biliary dilatation. Difficult to exclude abnormal enhancement along the anterior right hepatic lobe. This area could be further characterized with ultrasound.  Critical Value/emergent results were called by telephone at the time of interpretation on 04/26/2015 at 7:47 pm to Dr. Elwin Mocha , who verbally acknowledged these results.   Electronically Signed  By: Richarda Overlie M.D.   On: 04/26/2015 20:03   Dg Pelvis Portable  04/26/2015   CLINICAL DATA:  Right hip dislocation.  EXAM: PORTABLE PELVIS 1-2 VIEWS  COMPARISON:  03/26/2015  FINDINGS: The right hip prosthesis is dislocated with the femoral component displacing superior to the acetabular component. The prosthetic components remain well seated. There is no acute fracture.  Left hip prosthesis is well-seated and aligned.  Bones are demineralized.  IMPRESSION: Dislocated right hip prosthesis. No prosthetic loosening or evidence of a fracture.   Electronically Signed   By: Amie Portland M.D.   On: 04/26/2015 17:00   Dg Hip Port Unilat With Pelvis 1v Right  04/26/2015   CLINICAL DATA:  69 year old female with right hip arthroplasty complicated by recent right hip dislocation status post right hip reduction.  EXAM: DG HIP (WITH OR WITHOUT PELVIS) 1V PORT RIGHT  COMPARISON:  04/26/2015.  FINDINGS:  Postoperative changes of right total hip arthroplasty again noted. The prosthetic femoral head has been successfully relocated within the prosthetic acetabulum. No periprosthetic fractures are identified. Iodinated contrast material in the urinary bladder incidentally noted, presumably related to recent contrast enhanced chest CT.  IMPRESSION: 1. Successful relocation of right hip, without acute complicating features.   Electronically Signed   By: Trudie Reed M.D.   On: 04/26/2015 20:19    Medications:  Scheduled: . aspirin EC  81 mg Oral Daily  . aztreonam  2 g Intravenous Q8H  . ciprofloxacin  400 mg Intravenous Q12H  . enoxaparin (LOVENOX) injection  30 mg Subcutaneous Q24H  . gabapentin  600 mg Oral TID  . ipratropium-albuterol  3 mL Nebulization QID  . lidocaine  15 mL Mouth/Throat Once  . linagliptin  5 mg Oral Daily  . losartan  25 mg Oral Daily  . morphine  30 mg Oral Q12H  . simvastatin  10 mg Oral QPM  . vancomycin  500 mg Intravenous Q12H   Continuous: . 0.9 % NaCl with KCl 20 mEq / L 125 mL/hr at 04/27/15 1610   RUE:[id number] **OR** acetaminophen, albuterol, ALPRAZolam, diclofenac, iohexol, ipratropium, metoCLOPramide **OR** metoCLOPramide (REGLAN) injection, ondansetron **OR** ondansetron (ZOFRAN) IV, ondansetron **OR** ondansetron (ZOFRAN) IV, oxyCODONE, tiZANidine  Assessment/Plan:  Principal Problem:   Healthcare-associated pneumonia Active Problems:   COPD (chronic obstructive pulmonary disease)   Hyperlipidemia   Hypertension   Hypokalemia   Hyponatremia   Recurrent dislocation of hip joint prosthesis   Hip dislocation, right   Cholecystitis, acute   Mediastinal abscess    Mediastinal abscess ENT has been consulted. CT scan of the neck has been ordered. Continue broad-spectrum antibiotics. Blood cultures are pending. Further management depending on imaging studies.  Possible healthcare associated pneumonia Continue broad-spectrum antibiotics.  Follow-up culture reports.  Dislocation of the right hip prosthesis Seen by orthopedics. She had a closed reduction this morning. Management per orthopedics.  Abnormal appearing gallbladder on CT scan LFTs are unremarkable. Patient is nontender in the right upper quadrant. Ultrasound is pending.  Hyponatremia, hypokalemia, hypomagnesemia Continue IV fluids. Replace potassium and magnesium. Repeat in the morning. Holding her diuretics.  Normocytic anemia Continue to monitor hemoglobin. Some drop in the hemoglobin is likely due to dilution.  History of coronary artery disease Stable. Continue to monitor.  History of COPD Continue nebulizer treatments. Inhalers.  Questionable history of chronic pain Noted to be on long-acting narcotics at home. Since she is drowsy. We will cut back on the dose.  History of diabetes mellitus type 2 Sliding scale insulin  coverage. Hold her oral agents.   DVT Prophylaxis: Lovenox    Code Status: Full code  Family Communication: Discussed with the patient. No family at bedside.  Disposition Plan: Will remain in step down for now.    LOS: 1 day   [facility]  [facility] Pager [phone] 04/27/2015, 7:53 AM  If 7PM-7AM, please contact night-coverage at [email or web address], password [facility]

## 2015-04-27 NOTE — Op Note (Signed)
[name], [name]                ACCOUNT NO.:  [id number]  MEDICAL RECORD NO.:  [id number]  LOCATION:  3825                         FACILITY:  [facility]  PHYSICIAN:  Kostas Marrow D. Rolena Infante, M.D. DATE OF BIRTH:  12-Mar-1946  DATE OF PROCEDURE:  04/26/2015 DATE OF DISCHARGE:                              OPERATIVE REPORT   PREOPERATIVE DIAGNOSIS:  Right hip prosthesis dislocation.  POSTOPERATIVE DIAGNOSIS:  Right hip prosthesis dislocation.  OPERATIVE PROCEDURE:  Closed reduction under anesthesia.  COMPLICATIONS:  None.  CONDITION:  Stable.  HISTORY:  This is a very pleasant 69 year old woman who presented to the ER after sustaining her third atraumatic hip dislocation.  It was consented and taken to the operating room for closed reduction.  OPERATIVE NOTE:  The patient was brought to the operating room and placed supine in the stretcher.  After successful induction of general anesthesia and endotracheal intubation, time-out was taken confirming patient, procedure and all other pertinent important data.  A gentle closed reduction maneuver was then performed by myself with an audible reduction.  The hip was then gently ranged and was noted to be stable. A knee immobilizer was then applied, and AP and lateral x-ray were taken in the OR.  These were both satisfactory for reduction.  The patient was then extubated and transferred to the PACU without incident.     Amylynn Fano D. Rolena Infante, M.D.     DDB/MEDQ  D:  04/26/2015  T:  04/27/2015  Job:  [id number]

## 2015-04-27 NOTE — Progress Notes (Signed)
Initial Nutrition Assessment  DOCUMENTATION CODES:   Not applicable  INTERVENTION:  - Will monitor for needs with diet advancement  NUTRITION DIAGNOSIS:   Inadequate oral intake related to inability to eat as evidenced by NPO status.  GOAL:   Patient will meet greater than or equal to 90% of their needs  MONITOR:   Diet advancement, Weight trends, Labs, I & O's  REASON FOR ASSESSMENT:   Malnutrition Screening Tool  ASSESSMENT:   69 y.o. female with a past medical history of CAD, status post MI, insulin requiring type 2 diabetes, COPD, tobacco abuse disorder, EtOH abuse, hypertension, hyperlipidemia, history of breast cancer who was brought to the emergency department after having hip pain secondary to her third prosthetic hip joint dislocation in the recent past. As per husband, she was trying to shave her legs when the dislocation happened. He also states that for the past week or so she hasn't been feeling well, has had fatigue, chills, decreased appetite, cough and was noticed to have a picture of 102.13F.   Pt seen for MST. BMI indicates normal weight status. Pt NPO s/p hip fixation last night. Pt sleeping at time of visit with no family present; did not feel it was necessary to awake pt at this time.  Per weight hx review, pt has lost 8 lbs (6% body weight) in the past 2.5-2 months which is significant for time frame. Unable to perform physical assessment at this time. H&P does state decreased appetite PTA so will need to ask pt about duration and severity of this at follow-up.  Unable to meet needs. Medications reviewed. Labs reviewed; CBGs: 97 and 107 mg/dL, Na: 133 mmol/L, K: 2.9 mmol/L, Cl: 97 mmol/L, Ca: 7.9 mg/dL, Phos: 2.3 mg/dL, Mg: 1.5 mg/dL.    Diet Order:  Diet NPO time specified Except for: Sips with Meds  Skin:  Reviewed, no issues  Last BM:  8/12  Height:   Ht Readings from Last 1 Encounters:  04/27/15 5\' 2"  (1.575 m)    Weight:   Wt Readings from  Last 1 Encounters:  04/27/15 130 lb 11.7 oz (59.3 kg)    Ideal Body Weight:  50 kg (kg)  BMI:  Body mass index is 23.91 kg/(m^2).  Estimated Nutritional Needs:   Kcal:  6761-9509  Protein:  70-80 grams  Fluid:  2-2.2 L/day  EDUCATION NEEDS:   No education needs identified at this time     Jarome Matin, RD, LDN Inpatient Clinical Dietitian Pager # [phone] After hours/weekend pager # [phone]

## 2015-04-27 NOTE — Progress Notes (Signed)
Subjective: Procedure(s) (LRB): CLOSED REDUCTION HIP (Right) 1 Day Post-Op  Patient reports pain as 3 on 0-10 scale.  Reports none arm pain  Objective: Vital signs in last 24 hours: Temp:  [97.8 F (36.6 C)-98.7 F (37.1 C)] 97.8 F (36.6 C) (08/12 1600) Pulse Rate:  [53-88] 57 (08/12 1300) Resp:  [10-25] 18 (08/12 1700) BP: (79-137)/(37-76) 100/46 mmHg (08/12 1700) SpO2:  [88 %-100 %] 95 % (08/12 1300) Weight:  [59.3 kg (130 lb 11.7 oz)] 59.3 kg (130 lb 11.7 oz) (08/12 0000)  Intake/Output from previous day: 08/11 0701 - 08/12 0700 In: 2218.8 [I.V.:1468.8; IV Piggyback:250] Out: 800 [Urine:800]  Labs:  Recent Labs  04/26/15 1633 04/27/15 0320  WBC 12.8* 9.4  RBC 3.65* 3.29*  HCT 30.6* 27.5*  PLT 232 181    Recent Labs  04/26/15 1633 04/27/15 0320  NA 129* 133*  K 3.2* 2.9*  CL 89* 97*  CO2 30 29  BUN 18 12  CREATININE 1.12* 0.55  GLUCOSE 154* 110*  CALCIUM 8.4* 7.9*   No results for input(s): LABPT, INR in the last 72 hours.  Physical Exam: Minimal neck pain with palpation and range of motion Difficulty swallowing which has worsened over last few weeks Well healed cervical scar from previous corpectomy in 2010 Neg. Babinski, hoffman, clonus Intact 5/5 EHL/TA/GA ( unable to ambulate second to hip dislocation and reduction) FROM UE - no focal motor weakness noted Sensation to LT intact in UE   Assessment/Plan: 1. CT cervical Spine noted: possible osteo of C3/4 with abscess running C2-T1.    Etiology of infection unknown.  Patient has solid fusion C4-6.   2. XHB:ZJIRCVELF prevertebral soft tissue edema with gas bubbles in the tissues consistent with infection. 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 Enhancement extends into the posterior elements of C3 which may also be infected. There is ventral epidural enhancement which may be due to abscess/ phlegmon. There is severe spinal stenosis and cord compression at C3-4. Cervical  fusion at C4-5 and C5-6. Edema and enhancement extends into the C4 vertebral body which could be infected. Evidence of abnormal enhancement at C5 or C6. Moderate to severe spinal stenosis at C4-5 and mild to moderate spinal stenosis at C5-6.  15 mm epidural abscess to the left of T1, better seen on the prior CT. Reviewed with radiologist.  Agree with findings  Plan:  Although patient has abscess she does not appear septic at this time.  I do agree that removal of the plate would aid in recovery as it can serve as a nidus for infection.  However I am concerned abut her ability to tolerate such an extensive surgical procedure.  She does not demonstrate evidence clinically for myelopathy or progressive neuro deficits that would warrant urgent decompression.   I think the best course of action is IV Abx's thru the weekend and repeat MRI on Monday.   If she decompensates she would need to be transferred to [facility] for surgery so I would recommend that she be transffered now to [facility] since she is stable. If surgery needed it would involve removal of hardware via anterior approach and drainage of the abscess.  At that time I would have ENT evaluate the esophagus  She may also require a posterior decompression and fusion to address the C3/4 discitis.  I would be hesitant to address the 3/4 disease with ACDF having removed a plate for abscess. Will discuss with medical team.   Melina Schools, MD  [place] [phone]

## 2015-04-28 DIAGNOSIS — B9689 Other specified bacterial agents as the cause of diseases classified elsewhere: Secondary | ICD-10-CM

## 2015-04-28 DIAGNOSIS — K223 Perforation of esophagus: Secondary | ICD-10-CM | POA: Insufficient documentation

## 2015-04-28 DIAGNOSIS — M4622 Osteomyelitis of vertebra, cervical region: Secondary | ICD-10-CM | POA: Diagnosis present

## 2015-04-28 DIAGNOSIS — L0211 Cutaneous abscess of neck: Secondary | ICD-10-CM

## 2015-04-28 DIAGNOSIS — E119 Type 2 diabetes mellitus without complications: Secondary | ICD-10-CM

## 2015-04-28 DIAGNOSIS — G061 Intraspinal abscess and granuloma: Secondary | ICD-10-CM

## 2015-04-28 DIAGNOSIS — M4802 Spinal stenosis, cervical region: Secondary | ICD-10-CM

## 2015-04-28 DIAGNOSIS — R1310 Dysphagia, unspecified: Secondary | ICD-10-CM

## 2015-04-28 DIAGNOSIS — R634 Abnormal weight loss: Secondary | ICD-10-CM | POA: Insufficient documentation

## 2015-04-28 DIAGNOSIS — Z794 Long term (current) use of insulin: Secondary | ICD-10-CM

## 2015-04-28 DIAGNOSIS — Z96641 Presence of right artificial hip joint: Secondary | ICD-10-CM

## 2015-04-28 DIAGNOSIS — L0291 Cutaneous abscess, unspecified: Secondary | ICD-10-CM | POA: Insufficient documentation

## 2015-04-28 DIAGNOSIS — M4642 Discitis, unspecified, cervical region: Secondary | ICD-10-CM

## 2015-04-28 DIAGNOSIS — M24451 Recurrent dislocation, right hip: Secondary | ICD-10-CM

## 2015-04-28 DIAGNOSIS — R1319 Other dysphagia: Secondary | ICD-10-CM

## 2015-04-28 LAB — COMPREHENSIVE METABOLIC PANEL
ALK PHOS: 111 U/L (ref 38–126)
ALT: 10 U/L — ABNORMAL LOW (ref 14–54)
ANION GAP: 9 (ref 5–15)
AST: 14 U/L — AB (ref 15–41)
Albumin: 2.1 g/dL — ABNORMAL LOW (ref 3.5–5.0)
BUN: 8 mg/dL (ref 6–20)
CALCIUM: 8.1 mg/dL — AB (ref 8.9–10.3)
CO2: 25 mmol/L (ref 22–32)
Chloride: 100 mmol/L — ABNORMAL LOW (ref 101–111)
Creatinine, Ser: 0.39 mg/dL — ABNORMAL LOW (ref 0.44–1.00)
GFR calc Af Amer: >60 mL/min (ref >60)
GFR calc non Af Amer: >60 mL/min (ref >60)
Glucose, Bld: 109 mg/dL — ABNORMAL HIGH (ref 65–99)
POTASSIUM: 3.2 mmol/L — AB (ref 3.5–5.1)
Sodium: 134 mmol/L — ABNORMAL LOW (ref 135–145)
Total Bilirubin: 0.9 mg/dL (ref 0.3–1.2)
Total Protein: 5.8 g/dL — ABNORMAL LOW (ref 6.5–8.1)

## 2015-04-28 LAB — CBC
HEMATOCRIT: 29.3 % — AB (ref 36.0–46.0)
Hemoglobin: 9.9 g/dL — ABNORMAL LOW (ref 12.0–15.0)
MCH: 28 pg (ref 26.0–34.0)
MCHC: 33.8 g/dL (ref 30.0–36.0)
MCV: 83 fL (ref 78.0–100.0)
PLATELETS: 231 10*3/uL (ref 150–400)
RBC: 3.53 MIL/uL — ABNORMAL LOW (ref 3.87–5.11)
RDW: 14.8 % (ref 11.5–15.5)
WBC: 7.8 10*3/uL (ref 4.0–10.5)

## 2015-04-28 LAB — C DIFFICILE QUICK SCREEN W PCR REFLEX
C DIFFICILE (CDIFF) TOXIN: NEGATIVE
C Diff antigen: NEGATIVE
C Diff interpretation: NEGATIVE

## 2015-04-28 LAB — GLUCOSE, CAPILLARY
GLUCOSE-CAPILLARY: 87 mg/dL (ref 65–99)
GLUCOSE-CAPILLARY: 87 mg/dL (ref 65–99)
Glucose-Capillary: 77 mg/dL (ref 65–99)
Glucose-Capillary: 84 mg/dL (ref 65–99)
Glucose-Capillary: 86 mg/dL (ref 65–99)
Glucose-Capillary: 87 mg/dL (ref 65–99)
Glucose-Capillary: 93 mg/dL (ref 65–99)

## 2015-04-28 LAB — URINE CULTURE: Culture: NO GROWTH

## 2015-04-28 LAB — VANCOMYCIN, TROUGH: Vancomycin Tr: 4 ug/mL — ABNORMAL LOW (ref 10.0–20.0)

## 2015-04-28 LAB — MAGNESIUM: MAGNESIUM: 1.7 mg/dL (ref 1.7–2.4)

## 2015-04-28 MED ORDER — VANCOMYCIN HCL IN DEXTROSE 750-5 MG/150ML-% IV SOLN
750.0000 mg | Freq: Three times a day (TID) | INTRAVENOUS | Status: DC
  Administered 2015-04-29 – 2015-05-01 (×7): 750 mg via INTRAVENOUS
  Filled 2015-04-28 (×11): qty 150

## 2015-04-28 MED ORDER — SODIUM CHLORIDE 0.9 % IV SOLN
1.0000 g | Freq: Three times a day (TID) | INTRAVENOUS | Status: DC
  Administered 2015-04-28 – 2015-05-01 (×9): 1 g via INTRAVENOUS
  Filled 2015-04-28 (×11): qty 1

## 2015-04-28 MED ORDER — POTASSIUM CHLORIDE IN NACL 20-0.9 MEQ/L-% IV SOLN
INTRAVENOUS | Status: DC
  Administered 2015-04-28 – 2015-04-30 (×4): via INTRAVENOUS
  Administered 2015-05-01: 75 mL/h via INTRAVENOUS
  Administered 2015-05-01: 09:00:00 via INTRAVENOUS
  Administered 2015-05-01: 50 mL/h via INTRAVENOUS
  Filled 2015-04-28 (×6): qty 1000

## 2015-04-28 MED ORDER — POTASSIUM CHLORIDE 10 MEQ/100ML IV SOLN
10.0000 meq | INTRAVENOUS | Status: AC
  Administered 2015-04-28 (×4): 10 meq via INTRAVENOUS
  Filled 2015-04-28 (×4): qty 100

## 2015-04-28 MED ORDER — VANCOMYCIN HCL 500 MG IV SOLR
500.0000 mg | Freq: Once | INTRAVENOUS | Status: AC
  Administered 2015-04-28: 500 mg via INTRAVENOUS
  Filled 2015-04-28: qty 500

## 2015-04-28 NOTE — Progress Notes (Signed)
[facility] PROGRESS NOTE  [name] YQM:[id number] DOB: Apr 27, 1946 DOA: 04/26/2015  PCP: Redmond Baseman, MD  Brief HPI: 69 year old Caucasian female with a past medical history of coronary artery disease, insulin-dependent diabetes, COPD, history of breast cancer presented with right hip pain. She was noted to have a right hip prosthesis dislocation. She was also noted to have a fever. She was recently treated for pneumonia with Levaquin. CT scan of the chest revealed fluid collection in the lower neck and upper chest area. Patient was hospitalized for further management. She had a closed reduction of her hip dislocation. MRI of the cervical spine revealed osteomyelitis of her cervical spine with some gas bubbles.  Past medical history:  Past Medical History  Diagnosis Date  . Myocardial infarction 2000  . Diabetes mellitus type 2, insulin dependent   . COPD (chronic obstructive pulmonary disease)   . Hyperlipidemia   . OA (osteoarthritis) of knee   . Hypertension   . Coronary artery disease   . Asthma   . Elevated LFTs   . ETOH abuse   . Tobacco abuse   . Osteoarthritis   . Breast cancer     Consultants: Orthopedics, ENT  Procedures: Closed reduction of the right hip dislocation 8/12  Antibiotics: Vancomycin, aztreonam, ciprofloxacin 8/11  Subjective: Patient feels about the same. Denies any worsening of her neck pain. She does have chronic low back pain. Able to move her extremities. Denies nausea, vomiting. No cough, no shortness of breath or chest pain currently.   Objective: Vital Signs  Filed Vitals:   04/28/15 0312 04/28/15 0400 04/28/15 0500 04/28/15 0600  BP:  135/52 133/56 130/56  Pulse:  59 64 66  Temp: 98.7 F (37.1 C)     TempSrc: Oral     Resp:  22 23 23   Height:      Weight:      SpO2:  92% 93% 92%    Intake/Output Summary (Last 24 hours) at 04/28/15 0742 Last data filed at 04/28/15 0700  Gross per 24 hour  Intake   3000 ml    Output   2660 ml  Net    340 ml   Filed Weights   04/26/15 1624 04/27/15 0000  Weight: 60.328 kg (133 lb) 59.3 kg (130 lb 11.7 oz)    General appearance: Awake and alert. In no distress. Resp: Diminished air entry at the bases. No definite crackles or wheezing. Cardio: regular rate and rhythm, S1, S2 normal, no murmur, click, rub or gallop GI: soft, non-tender; bowel sounds normal; no masses,  no organomegaly Extremities: extremities normal, atraumatic, no cyanosis or edema Pulses: 2+ and symmetric Neurologic: No focal deficits. Moving all her extremities.  Lab Results:  Basic Metabolic Panel:  Recent Labs Lab 04/26/15 1633 04/27/15 0320 04/28/15 0350  NA 129* 133* 134*  K 3.2* 2.9* 3.2*  CL 89* 97* 100*  CO2 30 29 25   GLUCOSE 154* 110* 109*  BUN 18 12 8   CREATININE 1.12* 0.55 0.39*  CALCIUM 8.4* 7.9* 8.1*  MG  --  1.5* 1.7  PHOS  --  2.3*  --    Liver Function Tests:  Recent Labs Lab 04/27/15 0320 04/28/15 0350  AST 16 14*  ALT 9* 10*  ALKPHOS 87 111  BILITOT 0.8 0.9  PROT 5.8* 5.8*  ALBUMIN 2.3* 2.1*   CBC:  Recent Labs Lab 04/26/15 1633 04/27/15 0320 04/28/15 0350  WBC 12.8* 9.4 7.8  NEUTROABS 11.0* 7.3  --   HGB  10.0* 8.9* 9.9*  HCT 30.6* 27.5* 29.3*  MCV 83.8 83.6 83.0  PLT 232 181 231    CBG:  Recent Labs Lab 04/27/15 1220 04/27/15 1609 04/27/15 1942 04/27/15 2351 04/28/15 0311  GLUCAP 102* 101* 85 77 87    Recent Results (from the past 240 hour(s))  Blood Culture (routine x 2)     Status: None (Preliminary result)   Collection Time: 04/26/15  5:27 PM  Result Value Ref Range Status   Specimen Description BLOOD LEFT FOREARM  Final   Special Requests BOTTLES DRAWN AEROBIC AND ANAEROBIC 5CC  Final   Culture   Final    NO GROWTH < 24 HOURS Performed at [facility]    Report Status PENDING  Incomplete  Blood Culture (routine x 2)     Status: None (Preliminary result)   Collection Time: 04/26/15  5:29 PM  Result Value  Ref Range Status   Specimen Description BLOOD LEFT ANTECUBITAL  Final   Special Requests BOTTLES DRAWN AEROBIC AND ANAEROBIC 5CC  Final   Culture   Final    NO GROWTH < 24 HOURS Performed at [facility]    Report Status PENDING  Incomplete  Urine culture     Status: None (Preliminary result)   Collection Time: 04/26/15  5:48 PM  Result Value Ref Range Status   Specimen Description URINE, CLEAN CATCH  Final   Special Requests NONE  Final   Culture   Final    NO GROWTH < 24 HOURS Performed at [facility]    Report Status PENDING  Incomplete  MRSA PCR Screening     Status: None   Collection Time: 04/27/15 12:25 AM  Result Value Ref Range Status   MRSA by PCR NEGATIVE NEGATIVE Final    Comment:        The GeneXpert MRSA Assay (FDA approved for NASAL specimens only), is one component of a comprehensive MRSA colonization surveillance program. It is not intended to diagnose MRSA infection nor to guide or monitor treatment for MRSA infections.   C difficile quick scan w PCR reflex     Status: None   Collection Time: 04/28/15  3:10 AM  Result Value Ref Range Status   C Diff antigen NEGATIVE NEGATIVE Final   C Diff toxin NEGATIVE NEGATIVE Final   C Diff interpretation Negative for toxigenic C. difficile  Final      Studies/Results: Ct Soft Tissue Neck W Contrast  04/27/2015   CLINICAL DATA:  Paravertebral abscess. Abnormal CT chest yesterday. Cervical fusion 2010  EXAM: CT NECK WITH CONTRAST  TECHNIQUE: Multidetector CT imaging of the neck was performed using the standard protocol following the bolus administration of intravenous contrast.  CONTRAST:  OMNIPAQUE IOHEXOL 300 MG/ML  SOLN  COMPARISON:  CT chest 04/26/2015  FINDINGS: Pharynx and larynx: Normal tongue and oropharynx. Normal larynx. Extensive prevertebral soft tissue swelling and gas bubbles. Prevertebral swelling and fluid begins at approximately C2 and extends to T1. Rim enhancing fluid collection  to the left of T1 compatible with abscess as noted on chest CT.  Salivary glands: Negative  Thyroid: Negative  Lymph nodes: Negative for adenopathy.  Vascular: Carotid artery patent bilaterally with mild atherosclerotic disease. Jugular vein patent bilaterally without thrombus.  Limited intracranial: Negative  Visualized orbits: Not imaged  Mastoids and visualized paranasal sinuses: Clear  Skeleton: ACDF C4 through C6. Anterior plate in good position. Solid fusion with strut graft. No bony destruction at these levels. There is spondylosis  at C4-5 causing spinal and foraminal stenosis. There is also foraminal encroachment bilaterally at C5-6 due to spurring.  4 mm retrolisthesis C3-4 with disc degeneration and spurring. Severe spinal stenosis. Endplates are irregular. Osteomyelitis and discitis not excluded at this level as a cause for infection. Cervical spine MRI would be helpful to evaluate for spinal infection and to exclude epidural abscess.  Upper chest: 6 mm right upper lobe nodule again noted and unchanged. Small left pleural effusion.  IMPRESSION: Extensive prevertebral edema and fluid mixed with gas bubbles. This is anterior to anterior plate fusion of C4 through C7. This is most likely arising from the spine. The infection could be arising from an infected fusion plate or possibly from discitis and osteomyelitis at C3-4. There is endplate irregularity at C3-4 with prominent posterior osteophyte formation. There is severe spinal stenosis at C3-4 and moderate to severe spinal stenosis at C4-5 due to spurring. 15 mm abscess to the left of T1.  MRI cervical spine without and with contrast suggested to exclude osteomyelitis of the spine and rule out epidural abscess.  These results were called by telephone at the time of interpretation on 04/27/2015 at 9:18 am to Dr. Flo Shanks , who verbally acknowledged these results.   Electronically Signed   By: Marlan Palau M.D.   On: 04/27/2015 09:08   Ct Chest W  Contrast  04/26/2015   CLINICAL DATA:  Fever and cough.  Evaluate for pneumonia.  EXAM: CT CHEST WITH CONTRAST  TECHNIQUE: Multidetector CT imaging of the chest was performed during intravenous contrast administration.  CONTRAST:  80mL OMNIPAQUE IOHEXOL 300 MG/ML  SOLN  COMPARISON:  Chest radiograph 04/04/2015.  Chest CT 08/29/2014  FINDINGS: There is abnormal soft tissue in the lower neck along the left side of the thyroid gland and surrounding the upper esophagus. There is a small focal low-density structure along the left anterior aspect of C7 and T1. This collection has a small focus of gas within it and measures 1.1 x 0.9 x 2.0 cm. This collection is compatible with a soft tissue abscess. There is no significant fat plane between the proximal esophagus and the soft tissue abscess. Soft tissues in the left subcarinal region and distal esophageal region are indistinct. Difficult to exclude inflammatory changes around the distal esophageal region, particularly on sequence 2, image 31.  There are coronary artery calcifications. The gallbladder is distended and there is at least mild intrahepatic biliary dilatation. Limited evaluation of the anterior right liver due to artifact and motion. There may be abnormal enhancement along the anterior right hepatic lobe. In addition, there are slightly prominent lymph nodes in the upper abdomen and the left periaortic region. Some of these nodes were present on the exam from 2012.  The trachea and mainstem bronchi are patent. There appears chronic or recurrent filling defects in the left lower lobe bronchus and left lower lobe bronchi. There is chronic volume loss in the left lower lobe. Chronic patchy parenchymal densities in left lower lobe. Linear densities in medial right lower lobe could represent scar or atelectasis. There is a chronic of sub solid nodular structure in the anterior right upper lobe on sequence 4, image 18 measuring 5 mm. This has not significantly  changed since 2012.  There is an anterior surgical plate in the cervical spine extending down to C6. There is no evidence for bone destruction involving the lower cervical spine or upper thoracic spine adjacent to the area of inflammation and abscess collection.  IMPRESSION: There  is abnormal soft tissue in the lower neck and upper posterior mediastinum which likely represents edema and an inflammatory process. There is a small air-fluid collection along the left side of the cervical-thoracic junction which is compatible with an abscess. There is no clear evidence for bone destruction. The source for this inflammatory process is unclear. Upper esophagus is poorly characterized.  There are endobronchial filling defects in the left lower lobe airways. Similar findings were present on prior examinations and this could represent an area of recurrent mucous plugging or aspiration. There is some chronic volume loss in the left lower lobe.  The soft tissue along the mid and distal esophagus is indistinct. Difficult to exclude an inflammatory process in this region.  Gallbladder distension with at least mild intrahepatic biliary dilatation. Difficult to exclude abnormal enhancement along the anterior right hepatic lobe. This area could be further characterized with ultrasound.  Critical Value/emergent results were called by telephone at the time of interpretation on 04/26/2015 at 7:47 pm to Dr. Elwin Mocha , who verbally acknowledged these results.   Electronically Signed   By: Richarda Overlie M.D.   On: 04/26/2015 20:03   Mr Cervical Spine W Wo Contrast  04/27/2015   CLINICAL DATA:  Neck abscess. Dysphagia. Rule out discitis and osteomyelitis. Cervical fusion 2010.  EXAM: MRI CERVICAL SPINE WITHOUT AND WITH CONTRAST  TECHNIQUE: Multiplanar and multiecho pulse sequences of the cervical spine, to include the craniocervical junction and cervicothoracic junction, were obtained according to standard protocol without and with  intravenous contrast.  CONTRAST:  12mL MULTIHANCE GADOBENATE DIMEGLUMINE 529 MG/ML IV SOLN  COMPARISON:  CT neck 04/27/2015  FINDINGS: The patient was not able to hold still. Image quality degraded by moderate motion.  ACDF with anterior plate C4 through C6. Fusion appears solid. No significant enhancement related to the fusion. Moderate to severe spinal stenosis at C4-5 due to spurring. Marked foraminal encroachment bilaterally at C4-5. Mild to moderate spinal and moderate foraminal stenosis bilaterally at C5-6.  There is abnormal bone marrow edema and enhancement throughout C3 and C4. The disc space is ill-defined and appears eroded. There is enhancement of the ventral epidural soft tissues which may represent infection/epidural abscess. There is posterior osteophyte formation and severe spinal stenosis with cord compression. Possible mild cord edema. There is also edema and enhancement involving the posterior elements of C3 which may be related to infection as well.  Extensive prevertebral soft tissue swelling from C2 through T1. Gas bubbles are noted in the prevertebral tissues on CT. 15 mm epidural abscess is present to the left of T1, better seen on the prior CT.  IMPRESSION: Extensive prevertebral soft tissue edema with gas bubbles in the tissues consistent with infection. 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 Enhancement extends into the posterior elements of C3 which may also be infected. There is ventral epidural enhancement which may be due to abscess/ phlegmon. There is severe spinal stenosis and cord compression at C3-4. Cervical fusion at C4-5 and C5-6. Edema and enhancement extends into the C4 vertebral body which could be infected. Evidence of abnormal enhancement at C5 or C6. Moderate to severe spinal stenosis at C4-5 and mild to moderate spinal stenosis at C5-6.  15 mm epidural abscess to the left of T1, better seen on the prior CT.  These results were called by telephone at the  time of interpretation on 04/27/2015 at 3:00 pm to Dr. Flo Shanks , who verbally acknowledged these results.   Electronically Signed  By: Marlan Palau M.D.   On: 04/27/2015 15:03   Dg Pelvis Portable  04/26/2015   CLINICAL DATA:  Right hip dislocation.  EXAM: PORTABLE PELVIS 1-2 VIEWS  COMPARISON:  03/26/2015  FINDINGS: The right hip prosthesis is dislocated with the femoral component displacing superior to the acetabular component. The prosthetic components remain well seated. There is no acute fracture.  Left hip prosthesis is well-seated and aligned.  Bones are demineralized.  IMPRESSION: Dislocated right hip prosthesis. No prosthetic loosening or evidence of a fracture.   Electronically Signed   By: Amie Portland M.D.   On: 04/26/2015 17:00   Dg Hip Port Unilat With Pelvis 1v Right  04/26/2015   CLINICAL DATA:  69 year old female with right hip arthroplasty complicated by recent right hip dislocation status post right hip reduction.  EXAM: DG HIP (WITH OR WITHOUT PELVIS) 1V PORT RIGHT  COMPARISON:  04/26/2015.  FINDINGS: Postoperative changes of right total hip arthroplasty again noted. The prosthetic femoral head has been successfully relocated within the prosthetic acetabulum. No periprosthetic fractures are identified. Iodinated contrast material in the urinary bladder incidentally noted, presumably related to recent contrast enhanced chest CT.  IMPRESSION: 1. Successful relocation of right hip, without acute complicating features.   Electronically Signed   By: Trudie Reed M.D.   On: 04/26/2015 20:19   US Abdomen Limited Ruq  04/27/2015   CLINICAL DATA:  Cholecystitis, incidental finding of gallbladder distension on recent emergency room workup  EXAM: US ABDOMEN LIMITED - RIGHT UPPER QUADRANT  COMPARISON:  None.  FINDINGS: Gallbladder:  Distended gallbladder. Echogenic foci along the gallbladder wall without shadowing which may reflect gallbladder sludge. Unable to place the patient in  decubitus position. No gallbladder wall thickening or pericholecystic fluid. No sonographic Murphy sign noted.  Common bile duct:  Diameter: 5.7 mm  Liver:  No focal hepatic mass. Mild intrahepatic biliary ductal dilatation. Small amount of perihepatic fluid. Otherwise normal hepatic echogenicity.  IMPRESSION: 1. Gallbladder distention and mild intrahepatic biliary ductal dilatation. No sonographic findings to suggest acute cholecystitis. 2. Small amount of echogenic material in the gallbladder dependently which may reflect gallbladder sludge. 3. Small amount of perihepatic fluid.   Electronically Signed   By: Elige Ko   On: 04/27/2015 09:35    Medications:  Scheduled: . aztreonam  2 g Intravenous Q8H  . ciprofloxacin  400 mg Intravenous Q12H  . enoxaparin (LOVENOX) injection  30 mg Subcutaneous Q24H  . gabapentin  600 mg Oral TID  . insulin aspart  0-15 Units Subcutaneous 6 times per day  . ipratropium-albuterol  3 mL Nebulization QID  . lidocaine  15 mL Mouth/Throat Once  . losartan  25 mg Oral Daily  . morphine  30 mg Oral Q12H  . potassium chloride  10 mEq Intravenous Q1 Hr x 4  . simvastatin  10 mg Oral QPM  . vancomycin  500 mg Intravenous Q12H   Continuous: . 0.9 % NaCl with KCl 20 mEq / L 125 mL/hr at 04/28/15 0700   ZOX:[id number] **OR** acetaminophen, albuterol, ALPRAZolam, diclofenac, iohexol, ipratropium, metoCLOPramide **OR** metoCLOPramide (REGLAN) injection, ondansetron **OR** ondansetron (ZOFRAN) IV, ondansetron **OR** ondansetron (ZOFRAN) IV, oxyCODONE, tiZANidine  Assessment/Plan:  Principal Problem:   Healthcare-associated pneumonia Active Problems:   Diabetes mellitus type 2, insulin dependent   COPD (chronic obstructive pulmonary disease)   Hyperlipidemia   Hypertension   Hypokalemia   Hyponatremia   Recurrent dislocation of hip joint prosthesis   Hip dislocation, right   Cholecystitis, acute  Mediastinal abscess    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 Dr. Shon Baton performed her cervical surgery in the past. Had a long discussion with him yesterday regarding further management. Please see his note from yesterday. He would like to avoid an extensive surgical procedure if patient improves with conservative management. There is no urgent indication for surgery, as the patient does not have any neurological deficits. Discussed with ID, Dr. Daiva Eves. He will consult on the patient today. Patient is hemodynamically stable at this time. Further management per orthopedics. Esophageal integrity will need to be addressed. If surgery is needed, this will need to be done at [facility].  Possible healthcare associated pneumonia Continue broad-spectrum antibiotics. Follow-up culture reports.  Dislocation of the right hip prosthesis Seen by orthopedics. She had a closed reduction on the morning of 8/12. Management per orthopedics. Denies any pain.  Abnormal appearing gallbladder on CT scan LFTs are unremarkable. Patient continues to be nontender in the right upper quadrant. Ultrasound is as above. Gallbladder distention and minimal ductal dilatation was appreciated. In view of the fact that her LFTs are unremarkable and the patient is asymptomatic, do not anticipate further evaluation currently. This can be addressed in the future with outpatient general surgery follow-up.  Hyponatremia, hypokalemia, hypomagnesemia Electrolytes have improved. Replace potassium today. Magnesium is better today. Holding her diuretics.  Normocytic anemia Continue to monitor hemoglobin. Some drop in the hemoglobin is likely due to dilution.  History of coronary artery disease Stable. Continue to monitor.  History of COPD Continue nebulizer treatments. Inhalers.  Questionable history of chronic pain Noted to be on long-acting narcotics at home. Since she was drowsy her dose of narcotics was  reduced.  History of diabetes mellitus type 2 Sliding scale insulin coverage. Hold her oral agents.   DVT Prophylaxis: Lovenox    Code Status: Full code  Family Communication: Discussed with the patient. No family at bedside.  Disposition Plan: Will remain in step down for now.    LOS: 2 days   [facility]  [facility] Pager [phone] 04/28/2015, 7:42 AM  If 7PM-7AM, please contact night-coverage at [email or web address], password [facility]

## 2015-04-28 NOTE — Evaluation (Signed)
Occupational Therapy Evaluation Patient Details Name: [name] MRN: [id number] DOB: October 30, 1945 Today's Date: 04/28/2015    History of Present Illness  [name] is a 69 y.o. 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 Shon Baton;  past medical history of CAD, status post MI, insulin requiring type 2 diabetes, COPD, tobacco abuse disorder, EtOH abuse, hypertension, hyperlipidemia, history of breast cancer    Clinical Impression   Pt sat up at EOB and encouraged to then transfer into chair to sit up but pt declined. She did stand to have periarea cleaned but declined to attempt wiping herself at this time. Will follow on acute to progress ADL independence for return home with spouse.     Follow Up Recommendations  Home health OT;Supervision/Assistance - 24 hour    Equipment Recommendations  3 in 1 bedside comode    Recommendations for Other Services       Precautions / Restrictions Precautions Precautions: Back;Posterior Hip Required Braces or Orthoses: Knee Immobilizer - Right Restrictions RLE Weight Bearing: Weight bearing as tolerated      Mobility Bed Mobility Overal bed mobility: Needs Assistance Bed Mobility: Supine to Sit;Sit to Supine     Supine to sit: +2 for safety/equipment;Min assist Sit to supine: +2 for safety/equipment;Min assist   General bed mobility comments: for trunk and RLE  Transfers Overall transfer level: Needs assistance Equipment used: Rolling walker (2 wheeled) Transfers: Sit to/from Stand Sit to Stand: Min assist;+2 safety/equipment         General transfer comment: multi-modal cues for safety, hand placement, stands with trunk flexed; pt declines getting to chair    Balance Overall balance assessment: Needs assistance           Standing  balance-Leahy Scale: Poor                              ADL Overall ADL's : Needs assistance/impaired Eating/Feeding: Independent;Bed level   Grooming: Wash/dry hands;Set up;Bed level   Upper Body Bathing: Min guard;Sitting   Lower Body Bathing: Maximal assistance;+2 for safety/equipment;Sit to/from stand   Upper Body Dressing : Minimal assistance;Sitting   Lower Body Dressing: +2 for safety/equipment;Total assistance;Sit to/from stand Lower Body Dressing Details (indicate cue type and reason): +2 min asisst for sit to stand component. Toilet Transfer: +2 for safety/equipment;Minimal Production designer, theatre/television/film Details (indicate cue type and reason): sit to stand only. pt declined getting up to chair despite multiple attempts to encourage up to chair.  Toileting- Clothing Manipulation and Hygiene: +2 for safety/equipment;Total assistance;Sit to/from stand         General ADL Comments: Pt stating she was hurting and declined up to the chair. Did sit EOB and started to stand without giving notice. She did want to have periarea cleaned so assisted pt with washing posterior periarea. Encouraged pt to try to wash some on her own but pt declined this so total assist for any clothing manipulation or hygiene. reviewed THPs with pt and pt able to verbalize them back to therapists. She does have a reacher and long handled sponge.      Vision     Perception     Praxis      Pertinent Vitals/Pain Pain Assessment: 0-10 Pain Score: 7  Pain Location: back Pain  Descriptors / Indicators: Sore Pain Intervention(s): Limited activity within patient's tolerance;Monitored during session;Premedicated before session;Repositioned     Hand Dominance     Extremity/Trunk Assessment Upper Extremity Assessment Upper Extremity Assessment: Overall WFL for tasks assessed          Communication Communication Communication: No difficulties   Cognition Arousal/Alertness:  Awake/alert Behavior During Therapy: WFL for tasks assessed/performed Overall Cognitive Status: Within Functional Limits for tasks assessed                     General Comments       Exercises       Shoulder Instructions      Home Living Family/patient expects to be discharged to:: Private residence Living Arrangements: Spouse/significant other   Type of Home: House Home Access: Stairs to enter [employer] of Steps: 1   Home Layout: One level     Bathroom Shower/Tub: Producer, television/film/video: Standard     Home Equipment: Environmental consultant - 2 wheels;Bedside commode;Shower seat - built Designer, fashion/clothing: Long-handled sponge;Reacher        Prior Functioning/Environment Level of Independence: Needs assistance    ADL's / Homemaking Assistance Needed: assist with cooking, laundry        OT Diagnosis: Generalized weakness   OT Problem List: Decreased strength;Decreased knowledge of use of DME or AE   OT Treatment/Interventions: Self-care/ADL training;Patient/family education;Therapeutic activities;DME and/or AE instruction    OT Goals(Current goals can be found in the care plan section) Acute Rehab OT Goals Patient Stated Goal: none stated. OT Goal Formulation: With patient Time For Goal Achievement: 05/12/15 Potential to Achieve Goals: Good  OT Frequency: Min 2X/week   Barriers to D/C:            Co-evaluation PT/OT/SLP Co-Evaluation/Treatment: Yes   PT goals addressed during session: Mobility/safety with mobility;Proper use of DME OT goals addressed during session: ADL's and self-care;Proper use of Adaptive equipment and DME      End of Session Equipment Utilized During Treatment: Rolling walker;Right knee immobilizer  Activity Tolerance: Patient limited by pain Patient left: in bed;with call bell/phone within reach   Time: 1145-1205 OT Time Calculation (min): 20 min Charges:  OT General Charges $OT Visit:  1 Procedure OT Evaluation $Initial OT Evaluation Tier I: 1 Procedure G-Codes:    Lennox Laity  [phone] 04/28/2015, 4:08 PM

## 2015-04-28 NOTE — Evaluation (Signed)
Physical Therapy Evaluation Patient Details Name: [name] MRN: [id number] DOB: 10/28/1945 Today's Date: 04/28/2015   History of Present Illness   [name] is a 69 y.o. 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 Shon Baton;  past medical history of CAD, status post MI, insulin requiring type 2 diabetes, COPD, tobacco abuse disorder, EtOH abuse, hypertension, hyperlipidemia, history of breast cancer   Clinical Impression  Pt admitted with above diagnosis. Pt currently with functional limitations due to the deficits listed below (see PT Problem List).  Pt will benefit from skilled PT to increase their independence and safety with mobility to allow discharge to the venue listed below. D/C plan Depending on LOS and hospital course, however at this time feel pt will be able to D/C home with HHPT and husband support      Follow Up Recommendations Home health PT    Equipment Recommendations  None recommended by PT    Recommendations for Other Services       Precautions / Restrictions Precautions Precautions: Back;Posterior Hip Required Braces or Orthoses: Knee Immobilizer - Right Restrictions Weight Bearing Restrictions: Yes RLE Weight Bearing: Weight bearing as tolerated      Mobility  Bed Mobility Overal bed mobility: Needs Assistance Bed Mobility: Supine to Sit;Sit to Supine     Supine to sit: +2 for safety/equipment;Min assist Sit to supine: +2 for safety/equipment;Min assist   General bed mobility comments: for trunk and RLE  Transfers Overall transfer level: Needs assistance Equipment used: Rolling walker (2 wheeled) Transfers: Sit to/from Stand Sit to Stand: Min assist;+2 safety/equipment         General transfer comment: multi-modal cues for safety, hand placement, stands  with trunk flexed; pt declines getting to chair  Ambulation/Gait                Stairs            Wheelchair Mobility    Modified Rankin (Stroke Patients Only)       Balance Overall balance assessment: Needs assistance           Standing balance-Leahy Scale: Poor                               Pertinent Vitals/Pain Pain Assessment: 0-10 Pain Score: 7  Pain Location: back Pain Descriptors / Indicators: Sore Pain Intervention(s): Limited activity within patient's tolerance;Monitored during session;Premedicated before session;Repositioned    Home Living Family/patient expects to be discharged to:: Private residence Living Arrangements: Spouse/significant other   Type of Home: House Home Access: Stairs to enter   Secretary/administrator of Steps: 1 Home Layout: One level Home Equipment: Environmental consultant - 2 wheels;Bedside commode;Shower seat - built in      Prior Function Level of Independence: Needs assistance      ADL's / Homemaking Assistance Needed: assist with cooking, Print production planner Dominance        Extremity/Trunk Assessment   Upper Extremity Assessment: Defer to OT evaluation           Lower Extremity Assessment: RLE deficits/detail RLE Deficits / Details: ankle WFL       Communication   Communication: No difficulties  Cognition Arousal/Alertness: Awake/alert Behavior During Therapy: WFL for tasks assessed/performed Overall  Cognitive Status: Within Functional Limits for tasks assessed                      General Comments      Exercises        Assessment/Plan    PT Assessment    PT Diagnosis Difficulty walking   PT Problem List    PT Treatment Interventions     PT Goals (Current goals can be found in the Care Plan section)      Frequency     Barriers to discharge        Co-evaluation PT/OT/SLP Co-Evaluation/Treatment: Yes   PT goals addressed during session: Mobility/safety with  mobility;Proper use of DME         End of Session     Patient left: in bed;with call bell/phone within reach Nurse Communication: Mobility status         Time: 1914-7829 PT Time Calculation (min) (ACUTE ONLY): 22 min   Charges:   PT Evaluation $Initial PT Evaluation Tier I: 1 Procedure     PT G CodesDrucilla Chalet May 02, 2015, 1:34 PM

## 2015-04-28 NOTE — Consult Note (Signed)
[place] [facility]    Date of Admission:  04/26/2015  Date of Consult:  04/28/2015  Reason for Consult: diskitis ane epidural abscess  Referring Physician: Dr. Maryland Pink   HPI: [name] is an 69 y.o. female. 69 y.o. female with a past medical history of CAD,insulin requiring type 2 diabetes, COPD,  breast cancer, Cervical spine fusion, THA on the left  who was brought to the emergency department after having hip pain secondary to her third prosthetic hip joint dislocation in the recent past. As per husband, she was trying to shave her legs when the dislocation happened. He also states that for the past week or so she hasn't been feeling well, has had fatigue, chills, decreased appetite, cough and was noticed to have a picture of 102.9  Since admission she has had a CT scan of the chest and MRI which shows:  Extensive prevertebral soft tissue edema with gas bubbles in the tissues consistent with infection. 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 Enhancement extends into the posterior elements of C3 which may also be infected. There is ventral epidural enhancement which may be due to abscess/ phlegmon. There is severe spinal stenosis and cord compression at C3-4. Cervical fusion at C4-5 and C5-6. Edema and enhancement extends into the C4 vertebral body which could be infected. Evidence of abnormal enhancement at C5 or C6. Moderate to severe spinal stenosis at C4-5 and mild to moderate spinal stenosis at C5-6.  15 mm epidural abscess to the left of T1, better seen on the prior CT.   She has had her hip His location reduced and she has been seen in consultation by Dr. Rolena Infante and Dr. Erik Obey. Joint ENT and Spinal surgery had been contemplated but is not currently being pursued.   There is legitimate concern that the patient may have an esophageal perforation.  She tells me that she has lost over 70 pounds in the last year and has had  dysphagia at going back to November having difficulty swallowing her gabapentin pills for example.     Past Medical History  Diagnosis Date  . Myocardial infarction 2000  . Diabetes mellitus type 2, insulin dependent   . COPD (chronic obstructive pulmonary disease)   . Hyperlipidemia   . OA (osteoarthritis) of knee   . Hypertension   . Coronary artery disease   . Asthma   . Elevated LFTs   . ETOH abuse   . Tobacco abuse   . Osteoarthritis   . Breast cancer     Past Surgical History  Procedure Laterality Date  . Breast surgery  1991    right mastectomy  . Vulvectomy  1981    partial  . Neck fusion  2011  . Pelvic laparoscopy  2002    RSO  . Cesarean section  '78, '80, '81    x 3  . Hysteroscopy      D & C  . Cardiac catheterization  04/05/2009    EF 60%  . Lobectomy    . Tonsillectomy and adenoidectomy    . Mastectomy    . Coronary angioplasty  08/1998    x2 OF A BIFURCATION OM-1, OM-2 LESION  . Coronary angioplasty with stent placement  01/1999    MID FIRST OBTUSE MARGINAL VESSEL  . Coronary angioplasty with stent placement  07/1999    STENTING AT THE CRUX OF THE RIGHT CORONARY ARTERY WITH A 3.8MM X 18MM TETRA STENT  . Cardiovascular stress  test  01/31/2005    EF 58%  . Total hip arthroplasty  08/2010    bilat  . Hip closed reduction Right 04/26/2015    Procedure: CLOSED REDUCTION HIP;  Surgeon: Melina Schools, MD;  Location: [facility];  Service: Orthopedics;  Laterality: Right;  ergies:   Allergies  Allergen Reactions  . Zithromax [Azithromycin Dihydrate]     ORAL ULCERS   . Penicillins Rash     Medications: I have reviewed patients current medications as documented in Epic Anti-infectives    Start     Dose/Rate Route Frequency Ordered Stop   04/28/15 1400  meropenem (MERREM) 1 g in sodium chloride 0.9 % 100 mL IVPB     1 g 200 mL/hr over 30 Minutes Intravenous Every 8 hours 04/28/15 1312     04/27/15 1400  ciprofloxacin (CIPRO) IVPB 400 mg  Status:   Discontinued     400 mg 200 mL/hr over 60 Minutes Intravenous Every 12 hours 04/27/15 0528 04/28/15 1302   04/27/15 0600  vancomycin (VANCOCIN) 500 mg in sodium chloride 0.9 % 100 mL IVPB  Status:  Discontinued     500 mg 100 mL/hr over 60 Minutes Intravenous Every 12 hours 04/26/15 1827 04/26/15 2358   04/27/15 0600  aztreonam (AZACTAM) 1 g in dextrose 5 % 50 mL IVPB  Status:  Discontinued     1 g 100 mL/hr over 30 Minutes Intravenous 3 times per day 04/26/15 1827 04/26/15 2358   04/27/15 0600  vancomycin (VANCOCIN) 500 mg in sodium chloride 0.9 % 100 mL IVPB     500 mg 100 mL/hr over 60 Minutes Intravenous Every 12 hours 04/27/15 0026     04/27/15 0200  aztreonam (AZACTAM) 2 g in dextrose 5 % 50 mL IVPB  Status:  Discontinued     2 g 100 mL/hr over 30 Minutes Intravenous Every 8 hours 04/26/15 2358 04/28/15 1302   04/27/15 0000  ciprofloxacin (CIPRO) IVPB 200 mg  Status:  Discontinued     200 mg 100 mL/hr over 60 Minutes Intravenous Every 12 hours 04/26/15 2358 04/27/15 0528   04/26/15 1715  aztreonam (AZACTAM) 2 g in dextrose 5 % 50 mL IVPB     2 g 100 mL/hr over 30 Minutes Intravenous  Once 04/26/15 1711 04/26/15 1838   04/26/15 1715  vancomycin (VANCOCIN) IVPB 1000 mg/200 mL premix     1,000 mg 200 mL/hr over 60 Minutes Intravenous  Once 04/26/15 1711 04/26/15 1838      Social History:  reports that she has been smoking Cigarettes.  She has a 11.25 pack-year smoking history. She has never used smokeless tobacco. She reports that she does not drink alcohol or use illicit drugs.  Family History  Problem Relation Age of Onset  . Diabetes Mother   . Hypertension Father   . Heart disease Father   . Heart attack Father   . Stroke Father     As in HPI and primary teams notes otherwise 12 point review of systems is negative  Blood pressure 156/79, pulse 80, temperature 98.2 F (36.8 C), temperature source Axillary, resp. rate 26, height _0  (1.575 m), weight 130 lb 11.7 oz  (59.3 kg), SpO2 95 %. General: Alert and awake,not in any acute distress. HEENT: anicteric sclera, pupils reactive to light and accommodation, EOMI, surgical scar is clean CVregular rate, normal r,  no murmur rubs or gallops Chest: clear to auscultation bilaterally, no wheezing, rales or rhonchi Abdomen: soft nontender, nondistended, normal bowel sounds,  Extremities: no  clubbing or edema noted bilaterally Skin: no rashes Neuro: nonfocal, strength and sensation intact   Results for orders placed or performed during the hospital encounter of 04/26/15 (from the past 48 hour(s))  CBC with Differential/Platelet     Status: Abnormal   Collection Time: 04/26/15  4:33 PM  Result Value Ref Range   WBC 12.8 (H) 4.0 - 10.5 K/uL   RBC 3.65 (L) 3.87 - 5.11 MIL/uL   Hemoglobin 10.0 (L) 12.0 - 15.0 g/dL   HCT 30.6 (L) 36.0 - 46.0 %   MCV 83.8 78.0 - 100.0 fL   MCH 27.4 26.0 - 34.0 pg   MCHC 32.7 30.0 - 36.0 g/dL   RDW 14.8 11.5 - 15.5 %   Platelets 232 150 - 400 K/uL   Neutrophils Relative % 86 (H) 43 - 77 %   Neutro Abs 11.0 (H) 1.7 - 7.7 K/uL   Lymphocytes Relative 4 (L) 12 - 46 %   Lymphs Abs 0.5 (L) 0.7 - 4.0 K/uL   Monocytes Relative 10 3 - 12 %   Monocytes Absolute 1.3 (H) 0.1 - 1.0 K/uL   Eosinophils Relative 0 0 - 5 %   Eosinophils Absolute 0.0 0.0 - 0.7 K/uL   Basophils Relative 0 0 - 1 %   Basophils Absolute 0.0 0.0 - 0.1 K/uL  Basic metabolic panel     Status: Abnormal   Collection Time: 04/26/15  4:33 PM  Result Value Ref Range   Sodium 129 (L) 135 - 145 mmol/L   Potassium 3.2 (L) 3.5 - 5.1 mmol/L   Chloride 89 (L) 101 - 111 mmol/L   CO2 30 22 - 32 mmol/L   Glucose, Bld 154 (H) 65 - 99 mg/dL   BUN 18 6 - 20 mg/dL   Creatinine, Ser 1.12 (H) 0.44 - 1.00 mg/dL   Calcium 8.4 (L) 8.9 - 10.3 mg/dL   GFR calc non Af Amer 50 (L) >60 mL/min   GFR calc Af Amer 58 (L) >60 mL/min    Comment: (NOTE) The eGFR has been calculated using the CKD EPI equation. This calculation has not  been validated in all clinical situations. eGFR's persistently <60 mL/min signify possible Chronic Kidney Disease.    Anion gap 10 5 - 15  Blood Culture (routine x 2)     Status: None (Preliminary result)   Collection Time: 04/26/15  5:27 PM  Result Value Ref Range   Specimen Description BLOOD LEFT FOREARM    Special Requests BOTTLES DRAWN AEROBIC AND ANAEROBIC 5CC    Culture      NO GROWTH 2 DAYS Performed at [facility]    Report Status PENDING   Blood Culture (routine x 2)     Status: None (Preliminary result)   Collection Time: 04/26/15  5:29 PM  Result Value Ref Range   Specimen Description BLOOD LEFT ANTECUBITAL    Special Requests BOTTLES DRAWN AEROBIC AND ANAEROBIC 5CC    Culture      NO GROWTH 2 DAYS Performed at [facility]    Report Status PENDING   I-Stat CG4 Lactic Acid, ED  (not at  [facility])     Status: None   Collection Time: 04/26/15  5:40 PM  Result Value Ref Range   Lactic Acid, Venous 1.30 0.5 - 2.0 mmol/L  Urinalysis, Routine w reflex microscopic (not at [facility])     Status: Abnormal   Collection Time: 04/26/15  5:48 PM  Result Value Ref Range  Color, Urine AMBER (A) YELLOW    Comment: BIOCHEMICALS MAY BE AFFECTED BY COLOR   APPearance CLOUDY (A) CLEAR   Specific Gravity, Urine 1.020 1.005 - 1.030   pH 6.0 5.0 - 8.0   Glucose, UA NEGATIVE NEGATIVE mg/dL   Hgb urine dipstick MODERATE (A) NEGATIVE   Bilirubin Urine SMALL (A) NEGATIVE   Ketones, ur NEGATIVE NEGATIVE mg/dL   Protein, ur 100 (A) NEGATIVE mg/dL   Urobilinogen, UA 1.0 0.0 - 1.0 mg/dL   Nitrite NEGATIVE NEGATIVE   Leukocytes, UA TRACE (A) NEGATIVE  Urine culture     Status: None   Collection Time: 04/26/15  5:48 PM  Result Value Ref Range   Specimen Description URINE, CLEAN CATCH    Special Requests NONE    Culture      NO GROWTH 2 DAYS Performed at [facility]    Report Status 04/28/2015 FINAL   Urine microscopic-add on     Status: Abnormal   Collection Time:  04/26/15  5:48 PM  Result Value Ref Range   Squamous Epithelial / LPF FEW (A) RARE   WBC, UA 0-2 <3 WBC/hpf   RBC / HPF 3-6 <3 RBC/hpf   Casts HYALINE CASTS (A) NEGATIVE   Urine-Other AMORPHOUS URATES/PHOSPHATES   Glucose, capillary     Status: Abnormal   Collection Time: 04/26/15  9:01 PM  Result Value Ref Range   Glucose-Capillary 107 (H) 65 - 99 mg/dL   Comment 1 Notify RN   MRSA PCR Screening     Status: None   Collection Time: 04/27/15 12:25 AM  Result Value Ref Range   MRSA by PCR NEGATIVE NEGATIVE    Comment:        The GeneXpert MRSA Assay (FDA approved for NASAL specimens only), is one component of a comprehensive MRSA colonization surveillance program. It is not intended to diagnose MRSA infection nor to guide or monitor treatment for MRSA infections.   Strep pneumoniae urinary antigen     Status: None   Collection Time: 04/27/15 12:31 AM  Result Value Ref Range   Strep Pneumo Urinary Antigen NEGATIVE NEGATIVE    Comment:        Infection due to S. pneumoniae cannot be absolutely ruled out since the antigen present may be below the detection limit of the test. Performed at [facility]   HIV antibody     Status: None   Collection Time: 04/27/15  3:20 AM  Result Value Ref Range   HIV Screen 4th Generation wRfx Non Reactive Non Reactive    Comment: (NOTE) Performed At: [facility] [place], [place] [id number] Lindon Romp MD NM:[id number]   CBC WITH DIFFERENTIAL     Status: Abnormal   Collection Time: 04/27/15  3:20 AM  Result Value Ref Range   WBC 9.4 4.0 - 10.5 K/uL   RBC 3.29 (L) 3.87 - 5.11 MIL/uL   Hemoglobin 8.9 (L) 12.0 - 15.0 g/dL   HCT 27.5 (L) 36.0 - 46.0 %   MCV 83.6 78.0 - 100.0 fL   MCH 27.1 26.0 - 34.0 pg   MCHC 32.4 30.0 - 36.0 g/dL   RDW 14.7 11.5 - 15.5 %   Platelets 181 150 - 400 K/uL   Neutrophils Relative % 78 (H) 43 - 77 %   Neutro Abs 7.3 1.7 - 7.7 K/uL   Lymphocytes Relative 11 (L) 12 - 46 %    Lymphs Abs 1.1 0.7 - 4.0 K/uL   Monocytes  Relative 11 3 - 12 %   Monocytes Absolute 1.0 0.1 - 1.0 K/uL   Eosinophils Relative 0 0 - 5 %   Eosinophils Absolute 0.0 0.0 - 0.7 K/uL   Basophils Relative 0 0 - 1 %   Basophils Absolute 0.0 0.0 - 0.1 K/uL  Comprehensive metabolic panel     Status: Abnormal   Collection Time: 04/27/15  3:20 AM  Result Value Ref Range   Sodium 133 (L) 135 - 145 mmol/L   Potassium 2.9 (L) 3.5 - 5.1 mmol/L   Chloride 97 (L) 101 - 111 mmol/L   CO2 29 22 - 32 mmol/L   Glucose, Bld 110 (H) 65 - 99 mg/dL   BUN 12 6 - 20 mg/dL   Creatinine, Ser 0.55 0.44 - 1.00 mg/dL   Calcium 7.9 (L) 8.9 - 10.3 mg/dL   Total Protein 5.8 (L) 6.5 - 8.1 g/dL   Albumin 2.3 (L) 3.5 - 5.0 g/dL   AST 16 15 - 41 U/L   ALT 9 (L) 14 - 54 U/L   Alkaline Phosphatase 87 38 - 126 U/L   Total Bilirubin 0.8 0.3 - 1.2 mg/dL   GFR calc non Af Amer >60 >60 mL/min   GFR calc Af Amer >60 >60 mL/min    Comment: (NOTE) The eGFR has been calculated using the CKD EPI equation. This calculation has not been validated in all clinical situations. eGFR's persistently <60 mL/min signify possible Chronic Kidney Disease.    Anion gap 7 5 - 15  Magnesium     Status: Abnormal   Collection Time: 04/27/15  3:20 AM  Result Value Ref Range   Magnesium 1.5 (L) 1.7 - 2.4 mg/dL  Phosphorus     Status: Abnormal   Collection Time: 04/27/15  3:20 AM  Result Value Ref Range   Phosphorus 2.3 (L) 2.5 - 4.6 mg/dL  Glucose, capillary     Status: None   Collection Time: 04/27/15  8:06 AM  Result Value Ref Range   Glucose-Capillary 97 65 - 99 mg/dL   Comment 1 Notify RN    Comment 2 Document in Chart   Glucose, capillary     Status: Abnormal   Collection Time: 04/27/15 12:20 PM  Result Value Ref Range   Glucose-Capillary 102 (H) 65 - 99 mg/dL  Glucose, capillary     Status: Abnormal   Collection Time: 04/27/15  4:09 PM  Result Value Ref Range   Glucose-Capillary 101 (H) 65 - 99 mg/dL   Comment 1 Notify RN      Comment 2 Document in Chart   Glucose, capillary     Status: None   Collection Time: 04/27/15  7:42 PM  Result Value Ref Range   Glucose-Capillary 85 65 - 99 mg/dL   Comment 1 Notify RN   Glucose, capillary     Status: None   Collection Time: 04/27/15 11:51 PM  Result Value Ref Range   Glucose-Capillary 77 65 - 99 mg/dL  C difficile quick scan w PCR reflex     Status: None   Collection Time: 04/28/15  3:10 AM  Result Value Ref Range   C Diff antigen NEGATIVE NEGATIVE   C Diff toxin NEGATIVE NEGATIVE   C Diff interpretation Negative for toxigenic C. difficile   Glucose, capillary     Status: None   Collection Time: 04/28/15  3:11 AM  Result Value Ref Range   Glucose-Capillary 87 65 - 99 mg/dL  CBC     Status: Abnormal  Collection Time: 04/28/15  3:50 AM  Result Value Ref Range   WBC 7.8 4.0 - 10.5 K/uL   RBC 3.53 (L) 3.87 - 5.11 MIL/uL   Hemoglobin 9.9 (L) 12.0 - 15.0 g/dL   HCT 29.3 (L) 36.0 - 46.0 %   MCV 83.0 78.0 - 100.0 fL   MCH 28.0 26.0 - 34.0 pg   MCHC 33.8 30.0 - 36.0 g/dL   RDW 14.8 11.5 - 15.5 %   Platelets 231 150 - 400 K/uL  Comprehensive metabolic panel     Status: Abnormal   Collection Time: 04/28/15  3:50 AM  Result Value Ref Range   Sodium 134 (L) 135 - 145 mmol/L   Potassium 3.2 (L) 3.5 - 5.1 mmol/L   Chloride 100 (L) 101 - 111 mmol/L   CO2 25 22 - 32 mmol/L   Glucose, Bld 109 (H) 65 - 99 mg/dL   BUN 8 6 - 20 mg/dL   Creatinine, Ser 0.39 (L) 0.44 - 1.00 mg/dL   Calcium 8.1 (L) 8.9 - 10.3 mg/dL   Total Protein 5.8 (L) 6.5 - 8.1 g/dL   Albumin 2.1 (L) 3.5 - 5.0 g/dL   AST 14 (L) 15 - 41 U/L   ALT 10 (L) 14 - 54 U/L   Alkaline Phosphatase 111 38 - 126 U/L   Total Bilirubin 0.9 0.3 - 1.2 mg/dL   GFR calc non Af Amer >60 >60 mL/min   GFR calc Af Amer >60 >60 mL/min    Comment: (NOTE) The eGFR has been calculated using the CKD EPI equation. This calculation has not been validated in all clinical situations. eGFR's persistently <60 mL/min signify  possible Chronic Kidney Disease.    Anion gap 9 5 - 15  Magnesium     Status: None   Collection Time: 04/28/15  3:50 AM  Result Value Ref Range   Magnesium 1.7 1.7 - 2.4 mg/dL  Glucose, capillary     Status: None   Collection Time: 04/28/15  7:37 AM  Result Value Ref Range   Glucose-Capillary 84 65 - 99 mg/dL   Comment 1 Notify RN    Comment 2 Document in Chart   Glucose, capillary     Status: None   Collection Time: 04/28/15 12:07 PM  Result Value Ref Range   Glucose-Capillary 93 65 - 99 mg/dL   Comment 1 Notify RN    Comment 2 Document in Chart    _0 (sdes,specrequest,cult,reptstatus)   ) Recent Results (from the past 720 hour(s))  Blood Culture (routine x 2)     Status: None (Preliminary result)   Collection Time: 04/26/15  5:27 PM  Result Value Ref Range Status   Specimen Description BLOOD LEFT FOREARM  Final   Special Requests BOTTLES DRAWN AEROBIC AND ANAEROBIC 5CC  Final   Culture   Final    NO GROWTH 2 DAYS Performed at [facility]    Report Status PENDING  Incomplete  Blood Culture (routine x 2)     Status: None (Preliminary result)   Collection Time: 04/26/15  5:29 PM  Result Value Ref Range Status   Specimen Description BLOOD LEFT ANTECUBITAL  Final   Special Requests BOTTLES DRAWN AEROBIC AND ANAEROBIC 5CC  Final   Culture   Final    NO GROWTH 2 DAYS Performed at [facility]    Report Status PENDING  Incomplete  Urine culture     Status: None   Collection Time: 04/26/15  5:48 PM  Result Value  Ref Range Status   Specimen Description URINE, CLEAN CATCH  Final   Special Requests NONE  Final   Culture   Final    NO GROWTH 2 DAYS Performed at [facility]    Report Status 04/28/2015 FINAL  Final  MRSA PCR Screening     Status: None   Collection Time: 04/27/15 12:25 AM  Result Value Ref Range Status   MRSA by PCR NEGATIVE NEGATIVE Final    Comment:        The GeneXpert MRSA Assay (FDA approved for NASAL  specimens only), is one component of a comprehensive MRSA colonization surveillance program. It is not intended to diagnose MRSA infection nor to guide or monitor treatment for MRSA infections.   C difficile quick scan w PCR reflex     Status: None   Collection Time: 04/28/15  3:10 AM  Result Value Ref Range Status   C Diff antigen NEGATIVE NEGATIVE Final   C Diff toxin NEGATIVE NEGATIVE Final   C Diff interpretation Negative for toxigenic C. difficile  Final     Impression/Recommendation  Principal Problem:   Osteomyelitis of cervical spine Active Problems:   Diabetes mellitus type 2, insulin dependent   COPD (chronic obstructive pulmonary disease)   Hyperlipidemia   Hypertension   Hypokalemia   Hyponatremia   Recurrent dislocation of hip joint prosthesis   Hip dislocation, right   Healthcare-associated pneumonia   Cholecystitis, acute   Mediastinal abscess   [name] is a 69 y.o. 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 Surgical approach is not currently being pursued and we have been asked to assist in providing an P By the therapy for this patient  #1 Cervical discitis with Stenosis and epidural abscess:  This is going to be a VERY challenging case to cure medically as well especially if she still has an esophageal perforation that can seed bacteria continuously into her cervical space  Is she going to not be allowed to eat to reduce risk of flora getting into this space?  I can try to treat her with broad spectrum antibiotics in hopes of improving her cervical discitis and epidural abscess but again worry about the possibility that she has a perforation that we'll continue to provide bacteria to this site.  She had his childhood allergy to penicillin which causes a rash but cannot recall she has had penicillin's or syphilis borne since then. She is currently on vancomycin and  aztreonam. I will change her to vancomycin and meropenem which will give [place] even broader coverage and improve her anaerobic coverage  I would plan on giving her 8 weeks of IV anti-biotic's with follow-up imaging but again I have concern that she is going to fail this approach  #2 70 pound weight loss: potentially due to esophageal problems given the fact that she has had dysphagia since November 2015.  #3 prosthetic hip with recurrent dislocations: Could this prosthetic hip be infected may want to consider imaging the hip with MRI versus CT scan to ensure that it is not also a nidus of infection or site that is become seeded through her blood.  #4 Goals of care: I would also consider a palliative care consult given the challenges that this patient is going to be facing.   04/28/2015, 3:10 PM   Thank you so much for this interesting consult  [place] for [place] [phone] (pager)  [phone] (office) 04/28/2015, 3:10 PM  Alcide Evener 04/28/2015, 3:10 PM

## 2015-04-28 NOTE — Progress Notes (Signed)
Subjective: [place] pain   Objective: Vital signs in last 24 hours: Temp:  [97.7 F (36.5 C)-98.7 F (37.1 C)] 98.7 F (37.1 C) (08/13 0312) Pulse Rate:  [52-74] 66 (08/13 0600) Resp:  [12-26] 23 (08/13 0600) BP: (80-135)/(40-74) 130/56 mmHg (08/13 0600) SpO2:  [89 %-98 %] 92 % (08/13 0600)  Intake/Output from previous day: 08/12 0701 - 08/13 0700 In: 3000 [I.V.:2250; IV Piggyback:750] Out: 2660 [Urine:2660] Intake/Output this shift:     Recent Labs  04/26/15 1633 04/27/15 0320 04/28/15 0350  HGB 10.0* 8.9* 9.9*    Recent Labs  04/27/15 0320 04/28/15 0350  WBC 9.4 7.8  RBC 3.29* 3.53*  HCT 27.5* 29.3*  PLT 181 231    Recent Labs  04/27/15 0320 04/28/15 0350  NA 133* 134*  K 2.9* 3.2*  CL 97* 100*  CO2 29 25  BUN 12 8  CREATININE 0.55 0.39*  GLUCOSE 110* 109*  CALCIUM 7.9* 8.1*   No results for input(s): LABPT, INR in the last 72 hours.  Neurologically intact Sensation intact distally Compartment soft Hip located. Assessment/Plan: Discitis C34 with abscess C2-T1 stable. Discussed with Dr. Rolena Infante. Continue IV Ab observation. Repeat MRI Monday.   Vienne Corcoran C 04/28/2015, 8:15 AM

## 2015-04-28 NOTE — Progress Notes (Signed)
PHARMACIST - PHYSICIAN COMMUNICATION CONCERNING:  Vancomycin  69 yoF on IV antibiotics for diskitis and epidural abscess. Please see pharmacy note written earlier today by Dia Sitter, PharmD for more details.    Vancomycin trough checked tonight, not quite at Css yet however trough is very subtherapeutic @ 4 mcg/ml (goal 15-20 mcg/ml) on vancomycin 500mg  IV q12h.  Verified trough with lab.  No issues per RN. RN already started next dose of 500mg .    RECOMMENDATION: Additional vancomycin 500mg  IV x 1 to make total dose tonight  = 1000mg .  Vancomycin 750mg  IV q8h starting with next dose.  Check another trough at C[facility].   Ralene Bathe, PharmD, BCPS 04/28/2015, 6:20 PM  Pager: [phone]

## 2015-04-28 NOTE — Progress Notes (Addendum)
ANTIBIOTIC CONSULT NOTE - INITIAL  Pharmacy Consult for vancomycin Indication: diskitis, epidural abscess, PNA  Allergies  Allergen Reactions  . Zithromax [Azithromycin Dihydrate]     ORAL ULCERS   . Penicillins Rash    Patient Measurements: Height: 5\' 2"  (157.5 cm) Weight: 130 lb 11.7 oz (59.3 kg) IBW/kg (Calculated) : 50.1 Adjusted Body Weight:   Vital Signs: Temp: 98.1 F (36.7 C) (08/13 0800) Temp Source: Oral (08/13 0800) BP: 144/54 mmHg (08/13 0800) Pulse Rate: 82 (08/13 0800) Intake/Output from previous day: 08/12 0701 - 08/13 0700 In: 3000 [I.V.:2250; IV Piggyback:750] Out: 2660 [Urine:2660] Intake/Output from this shift: Total I/O In: 125 [I.V.:125] Out: 350 [Urine:350]  Labs:  Recent Labs  04/26/15 1633 04/27/15 0320 04/28/15 0350  WBC 12.8* 9.4 7.8  HGB 10.0* 8.9* 9.9*  PLT 232 181 231  CREATININE 1.12* 0.55 0.39*   Estimated Creatinine Clearance: 54 mL/min (by C-G formula based on Cr of 0.39). No results for input(s): VANCOTROUGH, VANCOPEAK, VANCORANDOM, GENTTROUGH, GENTPEAK, GENTRANDOM, TOBRATROUGH, TOBRAPEAK, TOBRARND, AMIKACINPEAK, AMIKACINTROU, AMIKACIN in the last 72 hours.   Microbiology: Recent Results (from the past 720 hour(s))  Blood Culture (routine x 2)     Status: None (Preliminary result)   Collection Time: 04/26/15  5:27 PM  Result Value Ref Range Status   Specimen Description BLOOD LEFT FOREARM  Final   Special Requests BOTTLES DRAWN AEROBIC AND ANAEROBIC 5CC  Final   Culture   Final    NO GROWTH < 24 HOURS Performed at [facility]    Report Status PENDING  Incomplete  Blood Culture (routine x 2)     Status: None (Preliminary result)   Collection Time: 04/26/15  5:29 PM  Result Value Ref Range Status   Specimen Description BLOOD LEFT ANTECUBITAL  Final   Special Requests BOTTLES DRAWN AEROBIC AND ANAEROBIC 5CC  Final   Culture   Final    NO GROWTH < 24 HOURS Performed at [facility]    Report Status  PENDING  Incomplete  Urine culture     Status: None (Preliminary result)   Collection Time: 04/26/15  5:48 PM  Result Value Ref Range Status   Specimen Description URINE, CLEAN CATCH  Final   Special Requests NONE  Final   Culture   Final    NO GROWTH < 24 HOURS Performed at [facility]    Report Status PENDING  Incomplete  MRSA PCR Screening     Status: None   Collection Time: 04/27/15 12:25 AM  Result Value Ref Range Status   MRSA by PCR NEGATIVE NEGATIVE Final    Comment:        The GeneXpert MRSA Assay (FDA approved for NASAL specimens only), is one component of a comprehensive MRSA colonization surveillance program. It is not intended to diagnose MRSA infection nor to guide or monitor treatment for MRSA infections.   C difficile quick scan w PCR reflex     Status: None   Collection Time: 04/28/15  3:10 AM  Result Value Ref Range Status   C Diff antigen NEGATIVE NEGATIVE Final   C Diff toxin NEGATIVE NEGATIVE Final   C Diff interpretation Negative for toxigenic C. difficile  Final    Medical History: Past Medical History  Diagnosis Date  . Myocardial infarction 2000  . Diabetes mellitus type 2, insulin dependent   . COPD (chronic obstructive pulmonary disease)   . Hyperlipidemia   . OA (osteoarthritis) of knee   . Hypertension   .  Coronary artery disease   . Asthma   . Elevated LFTs   . ETOH abuse   . Tobacco abuse   . Osteoarthritis   . Breast cancer    Assessment: 39 YOF presents with dislocated hip and fever. S/p R hip closed reduction on 8/11.  Antibiotics were initiated an admit for PNA.  Neck MRI on 8/12 showed diskitis and epidural abscess.  Plan for possible transfer to [facility] in case surgery is needed.  To continue current abx per othro.  Today, 04/28/2015:  Afebrile  Wbc down wnl  Scr down 0.39 (crcl~54)  8/11 >> vancomycin  Rx>> 8/11 >> aztreonam >>   8/12 >> cipro >>  8/11 blood x2:ngtd 8/11 urine:  ngtd 8/12 strep urine:  neg 8/13 cdiff antigen and toxin both negative   Goal of Therapy:  Vancomycin trough level 15-20 mcg/ml  Plan:   Continue Vancomycin 500mg  IV q12h.  Will check vancomycin trough level with dose tonight to assess current regimen.  Continue Aztreonam 1gm IV q8h and cipro 400 mg IV q12h  f/u cultures, renal function and clinical status  Dia Sitter, PharmD, BCPS 04/28/2015 9:23 AM   Adden:  To start meropenem (per ID).  Will start meropenem 1gm IV q8h.  With rash documented with PCN, will monitor closely for s/s of allergic rxn to carbapenem. Dia Sitter, PharmD, BCPS 04/28/2015 1:10 PM

## 2015-04-29 LAB — GLUCOSE, CAPILLARY
GLUCOSE-CAPILLARY: 97 mg/dL (ref 65–99)
GLUCOSE-CAPILLARY: 120 mg/dL — AB (ref 65–99)
GLUCOSE-CAPILLARY: 108 mg/dL — AB (ref 65–99)
Glucose-Capillary: 89 mg/dL (ref 65–99)
Glucose-Capillary: 103 mg/dL — ABNORMAL HIGH (ref 65–99)

## 2015-04-29 LAB — CBC
HCT: 30.2 % — ABNORMAL LOW (ref 36.0–46.0)
HEMOGLOBIN: 10.2 g/dL — AB (ref 12.0–15.0)
MCH: 27.3 pg (ref 26.0–34.0)
MCHC: 33.8 g/dL (ref 30.0–36.0)
MCV: 81 fL (ref 78.0–100.0)
Platelets: 256 10*3/uL (ref 150–400)
RBC: 3.73 MIL/uL — ABNORMAL LOW (ref 3.87–5.11)
RDW: 14.6 % (ref 11.5–15.5)
WBC: 8.2 10*3/uL (ref 4.0–10.5)

## 2015-04-29 LAB — BASIC METABOLIC PANEL
ANION GAP: 10 (ref 5–15)
Anion gap: 12 (ref 5–15)
CO2: 23 mmol/L (ref 22–32)
CO2: 25 mmol/L (ref 22–32)
CREATININE: 0.31 mg/dL — AB (ref 0.44–1.00)
Calcium: 7.9 mg/dL — ABNORMAL LOW (ref 8.9–10.3)
Calcium: 7.9 mg/dL — ABNORMAL LOW (ref 8.9–10.3)
Chloride: 96 mmol/L — ABNORMAL LOW (ref 101–111)
Chloride: 97 mmol/L — ABNORMAL LOW (ref 101–111)
Creatinine, Ser: 0.33 mg/dL — ABNORMAL LOW (ref 0.44–1.00)
GFR calc Af Amer: >60 mL/min (ref >60)
GLUCOSE: 91 mg/dL (ref 65–99)
Glucose, Bld: 137 mg/dL — ABNORMAL HIGH (ref 65–99)
Potassium: 2.8 mmol/L — ABNORMAL LOW (ref 3.5–5.1)
Potassium: 3.6 mmol/L (ref 3.5–5.1)
SODIUM: 132 mmol/L — AB (ref 135–145)
Sodium: 131 mmol/L — ABNORMAL LOW (ref 135–145)

## 2015-04-29 LAB — C-REACTIVE PROTEIN: CRP: 18.3 mg/dL — AB (ref <1.0)

## 2015-04-29 LAB — PREALBUMIN: Prealbumin: 6.3 mg/dL — ABNORMAL LOW (ref 18–38)

## 2015-04-29 LAB — HIV ANTIBODY (ROUTINE TESTING W REFLEX): HIV SCREEN 4TH GENERATION: NONREACTIVE

## 2015-04-29 LAB — SEDIMENTATION RATE: SED RATE: 85 mm/h — AB (ref 0–22)

## 2015-04-29 MED ORDER — POTASSIUM CHLORIDE CRYS ER 20 MEQ PO TBCR
40.0000 meq | EXTENDED_RELEASE_TABLET | ORAL | Status: AC
  Administered 2015-04-29 (×2): 40 meq via ORAL
  Filled 2015-04-29 (×2): qty 2

## 2015-04-29 MED ORDER — SACCHAROMYCES BOULARDII 250 MG PO CAPS
250.0000 mg | ORAL_CAPSULE | Freq: Two times a day (BID) | ORAL | Status: DC
  Administered 2015-04-29 – 2015-05-02 (×8): 250 mg via ORAL
  Filled 2015-04-29 (×11): qty 1

## 2015-04-29 MED ORDER — LOPERAMIDE HCL 2 MG PO CAPS
4.0000 mg | ORAL_CAPSULE | Freq: Once | ORAL | Status: AC
  Administered 2015-04-29: 4 mg via ORAL
  Filled 2015-04-29: qty 2

## 2015-04-29 MED ORDER — POTASSIUM CHLORIDE 10 MEQ/100ML IV SOLN
10.0000 meq | INTRAVENOUS | Status: DC
  Administered 2015-04-29: 10 meq via INTRAVENOUS
  Filled 2015-04-29: qty 100

## 2015-04-29 MED ORDER — ENOXAPARIN SODIUM 40 MG/0.4ML ~~LOC~~ SOLN
40.0000 mg | SUBCUTANEOUS | Status: DC
  Administered 2015-04-29 – 2015-05-02 (×4): 40 mg via SUBCUTANEOUS
  Filled 2015-04-29 (×4): qty 0.4

## 2015-04-29 NOTE — Progress Notes (Addendum)
[place] for Infectious Disease    Subjective: No new complaints    Antibiotics:  Anti-infectives    Start     Dose/Rate Route Frequency Ordered Stop   04/29/15 0200  vancomycin (VANCOCIN) IVPB 750 mg/150 ml premix     750 mg 150 mL/hr over 60 Minutes Intravenous Every 8 hours 04/28/15 1816     04/28/15 1900  vancomycin (VANCOCIN) 500 mg in sodium chloride 0.9 % 100 mL IVPB     500 mg 100 mL/hr over 60 Minutes Intravenous  Once 04/28/15 1815 04/28/15 1940   04/28/15 1400  meropenem (MERREM) 1 g in sodium chloride 0.9 % 100 mL IVPB     1 g 200 mL/hr over 30 Minutes Intravenous Every 8 hours 04/28/15 1312     04/27/15 1400  ciprofloxacin (CIPRO) IVPB 400 mg  Status:  Discontinued     400 mg 200 mL/hr over 60 Minutes Intravenous Every 12 hours 04/27/15 0528 04/28/15 1302   04/27/15 0600  vancomycin (VANCOCIN) 500 mg in sodium chloride 0.9 % 100 mL IVPB  Status:  Discontinued     500 mg 100 mL/hr over 60 Minutes Intravenous Every 12 hours 04/26/15 1827 04/26/15 2358   04/27/15 0600  aztreonam (AZACTAM) 1 g in dextrose 5 % 50 mL IVPB  Status:  Discontinued     1 g 100 mL/hr over 30 Minutes Intravenous 3 times per day 04/26/15 1827 04/26/15 2358   04/27/15 0600  vancomycin (VANCOCIN) 500 mg in sodium chloride 0.9 % 100 mL IVPB  Status:  Discontinued     500 mg 100 mL/hr over 60 Minutes Intravenous Every 12 hours 04/27/15 0026 04/28/15 1815   04/27/15 0200  aztreonam (AZACTAM) 2 g in dextrose 5 % 50 mL IVPB  Status:  Discontinued     2 g 100 mL/hr over 30 Minutes Intravenous Every 8 hours 04/26/15 2358 04/28/15 1302   04/27/15 0000  ciprofloxacin (CIPRO) IVPB 200 mg  Status:  Discontinued     200 mg 100 mL/hr over 60 Minutes Intravenous Every 12 hours 04/26/15 2358 04/27/15 0528   04/26/15 1715  aztreonam (AZACTAM) 2 g in dextrose 5 % 50 mL IVPB     2 g 100 mL/hr over 30 Minutes Intravenous  Once 04/26/15 1711 04/26/15 1838   04/26/15 1715  vancomycin  (VANCOCIN) IVPB 1000 mg/200 mL premix     1,000 mg 200 mL/hr over 60 Minutes Intravenous  Once 04/26/15 1711 04/26/15 1838      Medications: Scheduled Meds: . enoxaparin (LOVENOX) injection  40 mg Subcutaneous Q24H  . gabapentin  600 mg Oral TID  . insulin aspart  0-15 Units Subcutaneous 6 times per day  . lidocaine  15 mL Mouth/Throat Once  . losartan  25 mg Oral Daily  . meropenem (MERREM) IV  1 g Intravenous Q8H  . morphine  30 mg Oral Q12H  . saccharomyces boulardii  250 mg Oral BID  . simvastatin  10 mg Oral QPM  . vancomycin  750 mg Intravenous Q8H   Continuous Infusions: . 0.9 % NaCl with KCl 20 mEq / L 75 mL/hr at 04/29/15 1450   PRN Meds:.acetaminophen **OR** acetaminophen, albuterol, ALPRAZolam, diclofenac, iohexol, ipratropium, metoCLOPramide **OR** metoCLOPramide (REGLAN) injection, ondansetron **OR** ondansetron (ZOFRAN) IV, ondansetron **OR** ondansetron (ZOFRAN) IV, oxyCODONE, tiZANidine    Objective: Weight change:   Intake/Output Summary (Last 24 hours) at 04/29/15 1922 Last data filed at 04/29/15 1900  Gross per 24 hour  Intake   2845 ml  Output   3700 ml  Net   -855 ml   Blood pressure 155/74, pulse 65, temperature 98.2 F (36.8 C), temperature source Oral, resp. rate 23, height 5\' 2"  (1.575 m), weight 130 lb 11.7 oz (59.3 kg), SpO2 96 %. Temp:  [97.7 F (36.5 C)-98.9 F (37.2 C)] 98.2 F (36.8 C) (08/14 1630) Pulse Rate:  [39-113] 65 (08/14 1900) Resp:  [15-27] 23 (08/14 1900) BP: (134-172)/(52-103) 155/74 mmHg (08/14 1900) SpO2:  [92 %-100 %] 96 % (08/14 1900)  Physical Exam: General: Alert and awake,not in any acute distress. HEENT: anicteric sclera, pupils reactive to light and accommodation, EOMI, surgical scar is clean CVregular rate, normal r, no murmur rubs or gallops Chest: clear to auscultation bilaterally, no wheezing, rales or rhonchi Abdomen: soft nontender, nondistended, normal bowel sounds, Extremities: no clubbing or edema  noted bilaterally Skin: no rashes Neuro: nonfocal, strength and sensation intact   CBC: CBC Latest Ref Rng 04/29/2015 04/28/2015 04/27/2015  WBC 4.0 - 10.5 K/uL 8.2 7.8 9.4  Hemoglobin 12.0 - 15.0 g/dL 10.2(L) 9.9(L) 8.9(L)  Hematocrit 36.0 - 46.0 % 30.2(L) 29.3(L) 27.5(L)  Platelets 150 - 400 K/uL 256 231 181      BMET  Recent Labs  04/29/15 0335 04/29/15 1810  NA 131* 132*  K 2.8* 3.6  CL 96* 97*  CO2 23 25  GLUCOSE 91 137*  BUN <5* <5*  CREATININE 0.31* 0.33*  CALCIUM 7.9* 7.9*     Liver Panel   Recent Labs  04/27/15 0320 04/28/15 0350  PROT 5.8* 5.8*  ALBUMIN 2.3* 2.1*  AST 16 14*  ALT 9* 10*  ALKPHOS 87 111  BILITOT 0.8 0.9       Sedimentation Rate  Recent Labs  04/29/15 0335  ESRSEDRATE 85*   C-Reactive Protein  Recent Labs  04/29/15 0310  CRP 18.3*    Micro Results: Recent Results (from the past 720 hour(s))  Blood Culture (routine x 2)     Status: None (Preliminary result)   Collection Time: 04/26/15  5:27 PM  Result Value Ref Range Status   Specimen Description BLOOD LEFT FOREARM  Final   Special Requests BOTTLES DRAWN AEROBIC AND ANAEROBIC 5CC  Final   Culture   Final    NO GROWTH 3 DAYS Performed at [facility]    Report Status PENDING  Incomplete  Blood Culture (routine x 2)     Status: None (Preliminary result)   Collection Time: 04/26/15  5:29 PM  Result Value Ref Range Status   Specimen Description BLOOD LEFT ANTECUBITAL  Final   Special Requests BOTTLES DRAWN AEROBIC AND ANAEROBIC 5CC  Final   Culture   Final    NO GROWTH 3 DAYS Performed at [facility]    Report Status PENDING  Incomplete  Urine culture     Status: None   Collection Time: 04/26/15  5:48 PM  Result Value Ref Range Status   Specimen Description URINE, CLEAN CATCH  Final   Special Requests NONE  Final   Culture   Final    NO GROWTH 2 DAYS Performed at [facility]    Report Status 04/28/2015 FINAL  Final  MRSA PCR  Screening     Status: None   Collection Time: 04/27/15 12:25 AM  Result Value Ref Range Status   MRSA by PCR NEGATIVE NEGATIVE Final    Comment:        The GeneXpert  MRSA Assay (FDA approved for NASAL specimens only), is one component of a comprehensive MRSA colonization surveillance program. It is not intended to diagnose MRSA infection nor to guide or monitor treatment for MRSA infections.   C difficile quick scan w PCR reflex     Status: None   Collection Time: 04/28/15  3:10 AM  Result Value Ref Range Status   C Diff antigen NEGATIVE NEGATIVE Final   C Diff toxin NEGATIVE NEGATIVE Final   C Diff interpretation Negative for toxigenic C. difficile  Final    Studies/Results: No results found.    Assessment/Plan:  Principal Problem:   Osteomyelitis of cervical spine Active Problems:   Diabetes mellitus type 2, insulin dependent   COPD (chronic obstructive pulmonary disease)   Hyperlipidemia   Hypertension   Hypokalemia   Hyponatremia   Recurrent dislocation of hip joint prosthesis   Hip dislocation, right   Healthcare-associated pneumonia   Cholecystitis, acute   Mediastinal abscess   Abscess   Abscess of neck   Esophageal perforation   Loss of weight   Dysphagia, pharyngoesophageal phase    [name] is a 69 y.o. 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 Surgical approach is not currently being pursued and we have been asked to assist in providing antimicrobial therapy for this patient  #1 Cervical discitis with Stenosis and epidural abscess:  This is going to be a VERY challenging case to cure medically as well especially if she still has an esophageal perforation that can seed bacteria continuously into her cervical space  MRI pending for tomorrow  I would plan on giving her 8 weeks of IV anti-biotic's with follow-up imaging but again I have concern that she is  going to fail this approach  #2 70 pound weight loss: potentially due to esophageal problems given the fact that she has had dysphagia since November 2015.  #3 prosthetic hip with recurrent dislocations: Could this prosthetic hip be infected may want to consider imaging the hip with MRI versus CT scan to ensure that it is not also a nidus of infection or site that is become seeded through her blood.  Would consider after obtaining the MRI the C spine  #4 Goals of care: I would also consider a palliative care consult given the challenges that this patient is going to be facing.  Dr. Megan Salon is here tomorrow and I will be back on Tuesday.   LOS: 3 days   Alcide Evener 04/29/2015, 7:22 PM

## 2015-04-29 NOTE — Progress Notes (Signed)
[facility] PROGRESS NOTE  UVA BORSETH ZOX:[id number] DOB: [birth year] DOA: 04/26/2015  PCP: Redmond Baseman, MD  Brief HPI: 69 year old Caucasian female with a past medical history of coronary artery disease, insulin-dependent diabetes, COPD, history of breast cancer presented with right hip pain. She was noted to have a right hip prosthesis dislocation. She was also noted to have a fever. She was recently treated for pneumonia with Levaquin. CT scan of the chest revealed fluid collection in the lower neck and upper chest area. Patient was hospitalized for further management. She had a closed reduction of her hip dislocation. MRI of the cervical spine revealed osteomyelitis of her cervical spine with some gas bubbles.  Past medical history:  Past Medical History  Diagnosis Date  . Myocardial infarction 2000  . Diabetes mellitus type 2, insulin dependent   . COPD (chronic obstructive pulmonary disease)   . Hyperlipidemia   . OA (osteoarthritis) of knee   . Hypertension   . Coronary artery disease   . Asthma   . Elevated LFTs   . ETOH abuse   . Tobacco abuse   . Osteoarthritis   . Breast cancer     Consultants: Orthopedics, ENT, infectious diseases  Procedures: Closed reduction of the right hip dislocation 8/12  Antibiotics: Vancomycin, aztreonam, ciprofloxacin 8/11--8/13 Vancomycin and Meropenem 8/14  Subjective: Patient feels well. No worsening of pain in the neck or lower back. Tolerating a liquid diet.   Objective: Vital Signs  Filed Vitals:   04/29/15 0300 04/29/15 0400 04/29/15 0500 04/29/15 0600  BP: 156/70 144/63 135/63 159/73  Pulse: 65 63 60 59  Temp:  98.9 F (37.2 C)    TempSrc:  Oral    Resp: 24 19 18 17   Height:      Weight:      SpO2: 97% 96% 98% 97%    Intake/Output Summary (Last 24 hours) at 04/29/15 0722 Last data filed at 04/29/15 0657  Gross per 24 hour  Intake 2783.75 ml  Output   2450 ml  Net 333.75 ml   Filed Weights   04/26/15 1624 04/27/15 0000  Weight: 60.328 kg (133 lb) 59.3 kg (130 lb 11.7 oz)    General appearance: Awake and alert. In no distress. Resp: Improved air entry bilaterally. No definite crackles or wheezing. Cardio: regular rate and rhythm, S1, S2 normal, no murmur, click, rub or gallop GI: soft, non-tender; bowel sounds normal; no masses,  no organomegaly Extremities: extremities normal, atraumatic, no cyanosis or edema Pulses: 2+ and symmetric Neurologic: No focal deficits. Moving all her extremities.   Lab Results:  Basic Metabolic Panel:  Recent Labs Lab 04/26/15 1633 04/27/15 0320 04/28/15 0350 04/29/15 0335  NA 129* 133* 134* 131*  K 3.2* 2.9* 3.2* 2.8*  CL 89* 97* 100* 96*  CO2 30 29 25 23   GLUCOSE 154* 110* 109* 91  BUN 18 12 8  <5*  CREATININE 1.12* 0.55 0.39* 0.31*  CALCIUM 8.4* 7.9* 8.1* 7.9*  MG  --  1.5* 1.7  --   PHOS  --  2.3*  --   --    Liver Function Tests:  Recent Labs Lab 04/27/15 0320 04/28/15 0350  AST 16 14*  ALT 9* 10*  ALKPHOS 87 111  BILITOT 0.8 0.9  PROT 5.8* 5.8*  ALBUMIN 2.3* 2.1*   CBC:  Recent Labs Lab 04/26/15 1633 04/27/15 0320 04/28/15 0350 04/29/15 0335  WBC 12.8* 9.4 7.8 8.2  NEUTROABS 11.0* 7.3  --   --   HGB  10.0* 8.9* 9.9* 10.2*  HCT 30.6* 27.5* 29.3* 30.2*  MCV 83.8 83.6 83.0 81.0  PLT 232 181 231 256    CBG:  Recent Labs Lab 04/28/15 1207 04/28/15 1556 04/28/15 2014 04/28/15 2330 04/29/15 0353  GLUCAP 93 87 86 87 89    Recent Results (from the past 240 hour(s))  Blood Culture (routine x 2)     Status: None (Preliminary result)   Collection Time: 04/26/15  5:27 PM  Result Value Ref Range Status   Specimen Description BLOOD LEFT FOREARM  Final   Special Requests BOTTLES DRAWN AEROBIC AND ANAEROBIC 5CC  Final   Culture   Final    NO GROWTH 2 DAYS Performed at [facility]    Report Status PENDING  Incomplete  Blood Culture (routine x 2)     Status: None (Preliminary result)   Collection  Time: 04/26/15  5:29 PM  Result Value Ref Range Status   Specimen Description BLOOD LEFT ANTECUBITAL  Final   Special Requests BOTTLES DRAWN AEROBIC AND ANAEROBIC 5CC  Final   Culture   Final    NO GROWTH 2 DAYS Performed at [facility]    Report Status PENDING  Incomplete  Urine culture     Status: None   Collection Time: 04/26/15  5:48 PM  Result Value Ref Range Status   Specimen Description URINE, CLEAN CATCH  Final   Special Requests NONE  Final   Culture   Final    NO GROWTH 2 DAYS Performed at [facility]    Report Status 04/28/2015 FINAL  Final  MRSA PCR Screening     Status: None   Collection Time: 04/27/15 12:25 AM  Result Value Ref Range Status   MRSA by PCR NEGATIVE NEGATIVE Final    Comment:        The GeneXpert MRSA Assay (FDA approved for NASAL specimens only), is one component of a comprehensive MRSA colonization surveillance program. It is not intended to diagnose MRSA infection nor to guide or monitor treatment for MRSA infections.   C difficile quick scan w PCR reflex     Status: None   Collection Time: 04/28/15  3:10 AM  Result Value Ref Range Status   C Diff antigen NEGATIVE NEGATIVE Final   C Diff toxin NEGATIVE NEGATIVE Final   C Diff interpretation Negative for toxigenic C. difficile  Final      Studies/Results: Ct Soft Tissue Neck W Contrast  04/27/2015   CLINICAL DATA:  Paravertebral abscess. Abnormal CT chest yesterday. Cervical fusion 2010  EXAM: CT NECK WITH CONTRAST  TECHNIQUE: Multidetector CT imaging of the neck was performed using the standard protocol following the bolus administration of intravenous contrast.  CONTRAST:  OMNIPAQUE IOHEXOL 300 MG/ML  SOLN  COMPARISON:  CT chest 04/26/2015  FINDINGS: Pharynx and larynx: Normal tongue and oropharynx. Normal larynx. Extensive prevertebral soft tissue swelling and gas bubbles. Prevertebral swelling and fluid begins at approximately C2 and extends to T1. Rim enhancing  fluid collection to the left of T1 compatible with abscess as noted on chest CT.  Salivary glands: Negative  Thyroid: Negative  Lymph nodes: Negative for adenopathy.  Vascular: Carotid artery patent bilaterally with mild atherosclerotic disease. Jugular vein patent bilaterally without thrombus.  Limited intracranial: Negative  Visualized orbits: Not imaged  Mastoids and visualized paranasal sinuses: Clear  Skeleton: ACDF C4 through C6. Anterior plate in good position. Solid fusion with strut graft. No bony destruction at these levels. There is spondylosis  at C4-5 causing spinal and foraminal stenosis. There is also foraminal encroachment bilaterally at C5-6 due to spurring.  4 mm retrolisthesis C3-4 with disc degeneration and spurring. Severe spinal stenosis. Endplates are irregular. Osteomyelitis and discitis not excluded at this level as a cause for infection. Cervical spine MRI would be helpful to evaluate for spinal infection and to exclude epidural abscess.  Upper chest: 6 mm right upper lobe nodule again noted and unchanged. Small left pleural effusion.  IMPRESSION: Extensive prevertebral edema and fluid mixed with gas bubbles. This is anterior to anterior plate fusion of C4 through C7. This is most likely arising from the spine. The infection could be arising from an infected fusion plate or possibly from discitis and osteomyelitis at C3-4. There is endplate irregularity at C3-4 with prominent posterior osteophyte formation. There is severe spinal stenosis at C3-4 and moderate to severe spinal stenosis at C4-5 due to spurring. 15 mm abscess to the left of T1.  MRI cervical spine without and with contrast suggested to exclude osteomyelitis of the spine and rule out epidural abscess.  These results were called by telephone at the time of interpretation on 04/27/2015 at 9:18 am to Dr. Flo Shanks , who verbally acknowledged these results.   Electronically Signed   By: Marlan Palau M.D.   On: 04/27/2015 09:08     Mr Cervical Spine W Wo Contrast  04/27/2015   CLINICAL DATA:  Neck abscess. Dysphagia. Rule out discitis and osteomyelitis. Cervical fusion 2010.  EXAM: MRI CERVICAL SPINE WITHOUT AND WITH CONTRAST  TECHNIQUE: Multiplanar and multiecho pulse sequences of the cervical spine, to include the craniocervical junction and cervicothoracic junction, were obtained according to standard protocol without and with intravenous contrast.  CONTRAST:  12mL MULTIHANCE GADOBENATE DIMEGLUMINE 529 MG/ML IV SOLN  COMPARISON:  CT neck 04/27/2015  FINDINGS: The patient was not able to hold still. Image quality degraded by moderate motion.  ACDF with anterior plate C4 through C6. Fusion appears solid. No significant enhancement related to the fusion. Moderate to severe spinal stenosis at C4-5 due to spurring. Marked foraminal encroachment bilaterally at C4-5. Mild to moderate spinal and moderate foraminal stenosis bilaterally at C5-6.  There is abnormal bone marrow edema and enhancement throughout C3 and C4. The disc space is ill-defined and appears eroded. There is enhancement of the ventral epidural soft tissues which may represent infection/epidural abscess. There is posterior osteophyte formation and severe spinal stenosis with cord compression. Possible mild cord edema. There is also edema and enhancement involving the posterior elements of C3 which may be related to infection as well.  Extensive prevertebral soft tissue swelling from C2 through T1. Gas bubbles are noted in the prevertebral tissues on CT. 15 mm epidural abscess is present to the left of T1, better seen on the prior CT.  IMPRESSION: Extensive prevertebral soft tissue edema with gas bubbles in the tissues consistent with infection. 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 Enhancement extends into the posterior elements of C3 which may also be infected. There is ventral epidural enhancement which may be due to abscess/ phlegmon. There is severe spinal  stenosis and cord compression at C3-4. Cervical fusion at C4-5 and C5-6. Edema and enhancement extends into the C4 vertebral body which could be infected. Evidence of abnormal enhancement at C5 or C6. Moderate to severe spinal stenosis at C4-5 and mild to moderate spinal stenosis at C5-6.  15 mm epidural abscess to the left of T1, better seen on the prior CT.  These results were called by telephone at the time of interpretation on 04/27/2015 at 3:00 pm to Dr. Flo Shanks , who verbally acknowledged these results.   Electronically Signed   By: Marlan Palau M.D.   On: 04/27/2015 15:03   US Abdomen Limited Ruq  04/27/2015   CLINICAL DATA:  Cholecystitis, incidental finding of gallbladder distension on recent emergency room workup  EXAM: US ABDOMEN LIMITED - RIGHT UPPER QUADRANT  COMPARISON:  None.  FINDINGS: Gallbladder:  Distended gallbladder. Echogenic foci along the gallbladder wall without shadowing which may reflect gallbladder sludge. Unable to place the patient in decubitus position. No gallbladder wall thickening or pericholecystic fluid. No sonographic Murphy sign noted.  Common bile duct:  Diameter: 5.7 mm  Liver:  No focal hepatic mass. Mild intrahepatic biliary ductal dilatation. Small amount of perihepatic fluid. Otherwise normal hepatic echogenicity.  IMPRESSION: 1. Gallbladder distention and mild intrahepatic biliary ductal dilatation. No sonographic findings to suggest acute cholecystitis. 2. Small amount of echogenic material in the gallbladder dependently which may reflect gallbladder sludge. 3. Small amount of perihepatic fluid.   Electronically Signed   By: Elige Ko   On: 04/27/2015 09:35    Medications:  Scheduled: . enoxaparin (LOVENOX) injection  30 mg Subcutaneous Q24H  . gabapentin  600 mg Oral TID  . insulin aspart  0-15 Units Subcutaneous 6 times per day  . ipratropium-albuterol  3 mL Nebulization QID  . lidocaine  15 mL Mouth/Throat Once  . loperamide  4 mg Oral Once  .  losartan  25 mg Oral Daily  . meropenem (MERREM) IV  1 g Intravenous Q8H  . morphine  30 mg Oral Q12H  . potassium chloride  10 mEq Intravenous Q1 Hr x 4  . saccharomyces boulardii  250 mg Oral BID  . simvastatin  10 mg Oral QPM  . vancomycin  750 mg Intravenous Q8H   Continuous: . 0.9 % NaCl with KCl 20 mEq / L 75 mL/hr at 04/29/15 0050   ONG:[id number] **OR** acetaminophen, albuterol, ALPRAZolam, diclofenac, iohexol, ipratropium, metoCLOPramide **OR** metoCLOPramide (REGLAN) injection, ondansetron **OR** ondansetron (ZOFRAN) IV, ondansetron **OR** ondansetron (ZOFRAN) IV, oxyCODONE, tiZANidine  Assessment/Plan:  Principal Problem:   Osteomyelitis of cervical spine Active Problems:   Diabetes mellitus type 2, insulin dependent   COPD (chronic obstructive pulmonary disease)   Hyperlipidemia   Hypertension   Hypokalemia   Hyponatremia   Recurrent dislocation of hip joint prosthesis   Hip dislocation, right   Healthcare-associated pneumonia   Cholecystitis, acute   Mediastinal abscess   Abscess   Abscess of neck   Esophageal perforation   Loss of weight   Dysphagia, pharyngoesophageal phase    Cervical spine osteomyelitis with prevertebral soft tissue edema and gas bubbles  Orthopedics is following. Dr. Shon Baton performed her cervical surgery in the past. He would like to avoid an extensive surgical procedure if patient improves with conservative management. Patient seen by infectious disease. Antibiotics have been adjusted. Patient is hemodynamically stable at this time. Further management per orthopedics. Esophageal integrity will need to be addressed. Esophagogram has been ordered for tomorrow. If surgery is needed, this will need to be done at [facility].  Possible healthcare associated pneumonia Continue broad-spectrum antibiotics. Follow-up culture reports.  Dislocation of the right hip prosthesis Seen by orthopedics. She had a closed reduction on the morning of 8/12.  Management per orthopedics. Denies any pain. ID has recommended to consider MRI of the hip in case that is the source of infection. Will  discuss with Dr. Shon Baton tomorrow. Her hip replacement was in 2013, per patient.  Abnormal appearing gallbladder on CT scan LFTs are unremarkable. Patient remains nontender in the right upper quadrant. Ultrasound is as above. Gallbladder distention and minimal ductal dilatation was appreciated. But no cholecystitis identified. In view of the fact that her LFTs are unremarkable and the patient is asymptomatic, do not anticipate further evaluation currently. This can be addressed in the future with outpatient general surgery follow-up.  Hyponatremia, hypokalemia, hypomagnesemia Electrolytes have improved. Replace potassium. Holding her diuretics.  Normocytic anemia Continue to monitor hemoglobin. Some drop in the hemoglobin is likely due to dilution.  History of coronary artery disease Stable. Continue to monitor.  History of COPD Continue nebulizer treatments. Inhalers.  Questionable history of chronic pain Noted to be on long-acting narcotics at home. Since she was drowsy initially, her dose of narcotics was reduced.  History of diabetes mellitus type 2 Sliding scale insulin coverage. Holding her oral agents.   DVT Prophylaxis: Lovenox    Code Status: Full code  Family Communication: Discussed with the patient. No family at bedside.  Disposition Plan: Will remain in step down for now.    LOS: 3 days   [facility]  [facility] Pager [phone] 04/29/2015, 7:22 AM  If 7PM-7AM, please contact night-coverage at [email or web address], password [email or web address]

## 2015-04-29 NOTE — Progress Notes (Addendum)
Pt aware of nebs being changed to PRN only. Pt on room air, cl/dim-bs, last chest x-ray 04/26/15 clear, pt has no wob, pt takes nebs prn only at home.

## 2015-04-29 NOTE — Progress Notes (Signed)
Subjective: Minimal pain   Objective: Vital signs in last 24 hours: Temp:  [97.7 F (36.5 C)-98.9 F (37.2 C)] 97.7 F (36.5 C) (08/14 0750) Pulse Rate:  [39-87] 39 (08/14 0800) Resp:  [15-31] 21 (08/14 0800) BP: (131-172)/(56-103) 172/103 mmHg (08/14 0800) SpO2:  [91 %-99 %] 92 % (08/14 0800)  Intake/Output from previous day: 08/13 0701 - 08/14 0700 In: 2783.8 [I.V.:1683.8; IV Piggyback:1100] Out: 2450 [Urine:2450] Intake/Output this shift: Total I/O In: 295 [P.O.:120; I.V.:75; IV Piggyback:100] Out: 800 [Urine:800]   Recent Labs  04/26/15 1633 04/27/15 0320 04/28/15 0350 04/29/15 0335  HGB 10.0* 8.9* 9.9* 10.2*    Recent Labs  04/28/15 0350 04/29/15 0335  WBC 7.8 8.2  RBC 3.53* 3.73*  HCT 29.3* 30.2*  PLT 231 256    Recent Labs  04/28/15 0350 04/29/15 0335  NA 134* 131*  K 3.2* 2.8*  CL 100* 96*  CO2 25 23  BUN 8 <5*  CREATININE 0.39* 0.31*  GLUCOSE 109* 91  CALCIUM 8.1* 7.9*   No results for input(s): LABPT, INR in the last 72 hours.  Neurologically intact Sensation intact distally Compartment soft Hip located. Assessment/Plan: Discitis C34 with abscess C2-T1 stable. Plan Dr. Rolena Infante. Continue IV Ab observation. Repeat MRI Monday.   Therisa Mennella, Horald Pollen 04/29/2015, 9:13 AM

## 2015-04-30 ENCOUNTER — Inpatient Hospital Stay (HOSPITAL_COMMUNITY): Payer: Medicare Other

## 2015-04-30 LAB — GLUCOSE, CAPILLARY
GLUCOSE-CAPILLARY: 113 mg/dL — AB (ref 65–99)
Glucose-Capillary: 124 mg/dL — ABNORMAL HIGH (ref 65–99)
Glucose-Capillary: 121 mg/dL — ABNORMAL HIGH (ref 65–99)
Glucose-Capillary: 109 mg/dL — ABNORMAL HIGH (ref 65–99)
Glucose-Capillary: 121 mg/dL — ABNORMAL HIGH (ref 65–99)

## 2015-04-30 LAB — LEGIONELLA ANTIGEN, URINE

## 2015-04-30 LAB — VANCOMYCIN, TROUGH: VANCOMYCIN TR: 18 ug/mL (ref 10.0–20.0)

## 2015-04-30 LAB — BASIC METABOLIC PANEL
Anion gap: 8 (ref 5–15)
CALCIUM: 8.3 mg/dL — AB (ref 8.9–10.3)
CO2: 26 mmol/L (ref 22–32)
CREATININE: 0.32 mg/dL — AB (ref 0.44–1.00)
Chloride: 99 mmol/L — ABNORMAL LOW (ref 101–111)
GFR calc Af Amer: >60 mL/min (ref >60)
GLUCOSE: 130 mg/dL — AB (ref 65–99)
POTASSIUM: 3.5 mmol/L (ref 3.5–5.1)
SODIUM: 133 mmol/L — AB (ref 135–145)

## 2015-04-30 LAB — HEMOGLOBIN A1C
HEMOGLOBIN A1C: 5.8 % — AB (ref 4.8–5.6)
Mean Plasma Glucose: 120 mg/dL

## 2015-04-30 LAB — MAGNESIUM: MAGNESIUM: 1.6 mg/dL — AB (ref 1.7–2.4)

## 2015-04-30 MED ORDER — IOHEXOL 300 MG/ML  SOLN
50.0000 mL | Freq: Once | INTRAMUSCULAR | Status: DC | PRN
  Administered 2015-04-30: 50 mL via ORAL
  Filled 2015-04-30: qty 50

## 2015-04-30 MED ORDER — INSULIN ASPART 100 UNIT/ML ~~LOC~~ SOLN
0.0000 [IU] | Freq: Three times a day (TID) | SUBCUTANEOUS | Status: DC
  Administered 2015-04-30 – 2015-05-02 (×4): 2 [IU] via SUBCUTANEOUS

## 2015-04-30 MED ORDER — GADOBENATE DIMEGLUMINE 529 MG/ML IV SOLN
15.0000 mL | Freq: Once | INTRAVENOUS | Status: AC | PRN
  Administered 2015-04-30: 11 mL via INTRAVENOUS

## 2015-04-30 NOTE — Progress Notes (Signed)
[facility] PROGRESS NOTE  [name] WGN:[id number] DOB: May 26, 1946 DOA: 04/26/2015  PCP: Redmond Baseman, MD  Brief HPI: 69 year old Caucasian female with a past medical history of coronary artery disease, insulin-dependent diabetes, COPD, history of breast cancer presented with right hip pain. She was noted to have a right hip prosthesis dislocation. She was also noted to have a fever. She was recently treated for pneumonia with Levaquin. CT scan of the chest revealed fluid collection in the lower neck and upper chest area. Patient was hospitalized for further management. She had a closed reduction of her hip dislocation. MRI of the cervical spine revealed osteomyelitis of her cervical spine with some gas bubbles.  Past medical history:  Past Medical History  Diagnosis Date  . Myocardial infarction 2000  . Diabetes mellitus type 2, insulin dependent   . COPD (chronic obstructive pulmonary disease)   . Hyperlipidemia   . OA (osteoarthritis) of knee   . Hypertension   . Coronary artery disease   . Asthma   . Elevated LFTs   . ETOH abuse   . Tobacco abuse   . Osteoarthritis   . Breast cancer     Consultants: Orthopedics, ENT, infectious diseases  Procedures: Closed reduction of the right hip dislocation 8/12  Antibiotics: Vancomycin, aztreonam, ciprofloxacin 8/11--8/13 Vancomycin and Meropenem 8/14  Subjective: Patient feels well. Wants to go home. No worsening of pain in the neck or lower back. Tolerating liquid diet.   Objective: Vital Signs  Filed Vitals:   04/30/15 0200 04/30/15 0400 04/30/15 0450 04/30/15 0600  BP: 164/71 173/73 156/77 147/76  Pulse: 57 68 70 59  Temp:  97.6 F (36.4 C)    TempSrc:  Oral    Resp: 18 19 17 17   Height:      Weight:  55.9 kg (123 lb 3.8 oz)    SpO2: 95% 94% 95% 98%    Intake/Output Summary (Last 24 hours) at 04/30/15 0735 Last data filed at 04/30/15 0630  Gross per 24 hour  Intake   3085 ml  Output   5125  ml  Net  -2040 ml   Filed Weights   04/26/15 1624 04/27/15 0000 04/30/15 0400  Weight: 60.328 kg (133 lb) 59.3 kg (130 lb 11.7 oz) 55.9 kg (123 lb 3.8 oz)    General appearance: Awake and alert. In no distress. Resp: Good air entry bilaterally. No definite crackles or wheezing. Cardio: regular rate and rhythm, S1, S2 normal, no murmur, click, rub or gallop GI: soft, non-tender; bowel sounds normal; no masses,  no organomegaly Neurologic: No focal deficits. Moving all her extremities.   Lab Results:  Basic Metabolic Panel:  Recent Labs Lab 04/27/15 0320 04/28/15 0350 04/29/15 0335 04/29/15 1810 04/30/15 0405  NA 133* 134* 131* 132* 133*  K 2.9* 3.2* 2.8* 3.6 3.5  CL 97* 100* 96* 97* 99*  CO2 29 25 23 25 26   GLUCOSE 110* 109* 91 137* 130*  BUN 12 8 <5* <5* <5*  CREATININE 0.55 0.39* 0.31* 0.33* 0.32*  CALCIUM 7.9* 8.1* 7.9* 7.9* 8.3*  MG 1.5* 1.7  --   --  1.6*  PHOS 2.3*  --   --   --   --    Liver Function Tests:  Recent Labs Lab 04/27/15 0320 04/28/15 0350  AST 16 14*  ALT 9* 10*  ALKPHOS 87 111  BILITOT 0.8 0.9  PROT 5.8* 5.8*  ALBUMIN 2.3* 2.1*   CBC:  Recent Labs Lab 04/26/15 1633 04/27/15 0320  04/28/15 0350 04/29/15 0335  WBC 12.8* 9.4 7.8 8.2  NEUTROABS 11.0* 7.3  --   --   HGB 10.0* 8.9* 9.9* 10.2*  HCT 30.6* 27.5* 29.3* 30.2*  MCV 83.8 83.6 83.0 81.0  PLT 232 181 231 256    CBG:  Recent Labs Lab 04/29/15 0751 04/29/15 1216 04/29/15 1621 04/29/15 2029 04/30/15 0109  GLUCAP 97 103* 120* 108* 113*    Recent Results (from the past 240 hour(s))  Blood Culture (routine x 2)     Status: None (Preliminary result)   Collection Time: 04/26/15  5:27 PM  Result Value Ref Range Status   Specimen Description BLOOD LEFT FOREARM  Final   Special Requests BOTTLES DRAWN AEROBIC AND ANAEROBIC 5CC  Final   Culture   Final    NO GROWTH 3 DAYS Performed at [facility]    Report Status PENDING  Incomplete  Blood Culture (routine x 2)      Status: None (Preliminary result)   Collection Time: 04/26/15  5:29 PM  Result Value Ref Range Status   Specimen Description BLOOD LEFT ANTECUBITAL  Final   Special Requests BOTTLES DRAWN AEROBIC AND ANAEROBIC 5CC  Final   Culture   Final    NO GROWTH 3 DAYS Performed at [facility]    Report Status PENDING  Incomplete  Urine culture     Status: None   Collection Time: 04/26/15  5:48 PM  Result Value Ref Range Status   Specimen Description URINE, CLEAN CATCH  Final   Special Requests NONE  Final   Culture   Final    NO GROWTH 2 DAYS Performed at [facility]    Report Status 04/28/2015 FINAL  Final  MRSA PCR Screening     Status: None   Collection Time: 04/27/15 12:25 AM  Result Value Ref Range Status   MRSA by PCR NEGATIVE NEGATIVE Final    Comment:        The GeneXpert MRSA Assay (FDA approved for NASAL specimens only), is one component of a comprehensive MRSA colonization surveillance program. It is not intended to diagnose MRSA infection nor to guide or monitor treatment for MRSA infections.   C difficile quick scan w PCR reflex     Status: None   Collection Time: 04/28/15  3:10 AM  Result Value Ref Range Status   C Diff antigen NEGATIVE NEGATIVE Final   C Diff toxin NEGATIVE NEGATIVE Final   C Diff interpretation Negative for toxigenic C. difficile  Final      Studies/Results: No results found.  Medications:  Scheduled: . enoxaparin (LOVENOX) injection  40 mg Subcutaneous Q24H  . gabapentin  600 mg Oral TID  . insulin aspart  0-15 Units Subcutaneous 6 times per day  . lidocaine  15 mL Mouth/Throat Once  . losartan  25 mg Oral Daily  . meropenem (MERREM) IV  1 g Intravenous Q8H  . morphine  30 mg Oral Q12H  . saccharomyces boulardii  250 mg Oral BID  . simvastatin  10 mg Oral QPM  . vancomycin  750 mg Intravenous Q8H   Continuous: . 0.9 % NaCl with KCl 20 mEq / L 75 mL/hr at 04/29/15 1450   WUJ:[id number] **OR** acetaminophen,  albuterol, ALPRAZolam, diclofenac, iohexol, ipratropium, metoCLOPramide **OR** metoCLOPramide (REGLAN) injection, ondansetron **OR** ondansetron (ZOFRAN) IV, ondansetron **OR** ondansetron (ZOFRAN) IV, oxyCODONE, tiZANidine  Assessment/Plan:  Principal Problem:   Osteomyelitis of cervical spine Active Problems:   Diabetes mellitus type 2, insulin dependent  COPD (chronic obstructive pulmonary disease)   Hyperlipidemia   Hypertension   Hypokalemia   Hyponatremia   Recurrent dislocation of hip joint prosthesis   Hip dislocation, right   Healthcare-associated pneumonia   Cholecystitis, acute   Mediastinal abscess   Abscess   Abscess of neck   Esophageal perforation   Loss of weight   Dysphagia, pharyngoesophageal phase    Cervical spine osteomyelitis with prevertebral soft tissue edema and gas bubbles  Orthopedics is following. Dr. Shon Baton performed her cervical surgery in the past. He would like to avoid an extensive surgical procedure if patient improves with conservative management. Patient seen by infectious disease. Antibiotics have been adjusted. Patient is hemodynamically stable at this time. Further management per orthopedics. Esophageal integrity will need to be addressed. Esophagogram has been ordered. If surgery is needed, this will need to be done at [facility].  Possible healthcare associated pneumonia Continue broad-spectrum antibiotics. Blood cultures negative so far.  Dislocation of the right hip prosthesis Seen by orthopedics. She had a closed reduction on the morning of 8/12. Management per orthopedics. Denies any pain. ID has recommended to consider MRI of the hip in case that is the source of infection. Will discuss with Dr. Shon Baton. Her hip replacement was in 2013, per patient.  Abnormal appearing gallbladder on CT scan LFTs are unremarkable. Patient remains nontender in the right upper quadrant. Ultrasound is as above. Gallbladder distention and minimal ductal  dilatation was appreciated. But no cholecystitis identified. In view of the fact that her LFTs are unremarkable and the patient is asymptomatic, do not anticipate further evaluation currently. This can be addressed in the future with outpatient general surgery follow-up.  Hyponatremia, hypokalemia, hypomagnesemia Electrolytes have improved. Holding her diuretics.  Normocytic anemia Continue to monitor hemoglobin. Some drop in the hemoglobin is likely due to dilution.  History of coronary artery disease Stable. Continue to monitor.  History of COPD Continue nebulizer treatments. Inhalers.  History of chronic low back pain Noted to be on long-acting narcotics at home. Since she was drowsy initially, her dose of narcotics was reduced. Pain is reasonably well controlled.  History of diabetes mellitus type 2 Sliding scale insulin coverage. Holding her oral agents.   DVT Prophylaxis: Lovenox    Code Status: Full code  Family Communication: Discussed with the patient. No family at bedside.  Disposition Plan: Await MRI. Await orthopedics input. Patient remains stable. If no surgery is planned, she could be transferred to the floor.    LOS: 4 days   [facility]  [facility] Pager [phone] 04/30/2015, 7:35 AM  If 7PM-7AM, please contact night-coverage at [email or web address], password [facility]

## 2015-04-30 NOTE — Progress Notes (Signed)
    Subjective: 4 Days Post-Op Procedure(s) (LRB): CLOSED REDUCTION HIP (Right) Patient reports pain as 3 on 0-10 scale.   Denies CP or SOB.  Voiding without difficulty. Positive flatus. Objective: Vital signs in last 24 hours: Temp:  [97.6 F (36.4 C)-98.4 F (36.9 C)] 97.8 F (36.6 C) (08/15 1526) Pulse Rate:  [57-80] 70 (08/15 1526) Resp:  [14-29] 20 (08/15 1526) BP: (133-173)/(62-109) 156/79 mmHg (08/15 1526) SpO2:  [94 %-99 %] 99 % (08/15 1526) Weight:  [55.9 kg (123 lb 3.8 oz)] 55.9 kg (123 lb 3.8 oz) (08/15 0400)  Intake/Output from previous day: 08/14 0701 - 08/15 0700 In: 3085 [P.O.:660; I.V.:1575; IV Piggyback:850] Out: 4239 [Urine:5125] Intake/Output this shift: Total I/O In: 895 [P.O.:240; I.V.:405; IV Piggyback:250] Out: 700 [Urine:700]  Labs:  Recent Labs  04/28/15 0350 04/29/15 0335  HGB 9.9* 10.2*    Recent Labs  04/28/15 0350 04/29/15 0335  WBC 7.8 8.2  RBC 3.53* 3.73*  HCT 29.3* 30.2*  PLT 231 256    Recent Labs  04/29/15 1810 04/30/15 0405  NA 132* 133*  K 3.6 3.5  CL 97* 99*  CO2 25 26  BUN <5* <5*  CREATININE 0.33* 0.32*  GLUCOSE 137* 130*  CALCIUM 7.9* 8.3*   No results for input(s): LABPT, INR in the last 72 hours.  Physical Exam: Neurologically intact Intact pulses distally Incision: no drainage No cellulitis present  Assessment/Plan: 4 Days Post-Op Procedure(s) (LRB): CLOSED REDUCTION HIP (Right) MRI reveiwed - no significant change/progression Will plan on removal of anterior cervical plate and drainage of abscess this thrusday.   Will transfer to [facility] Wednesday for surgery Thrusday AM Will continue to monitor exam Will also arrange for intra-op ENT eval on Thrusday.  Melina Schools D for Dr. Melina Schools [facility] [phone] 04/30/2015, 5:13 PM

## 2015-04-30 NOTE — Progress Notes (Signed)
Pharmacy - Antibiotic dosing  Assessment: 69yoF on Vancomycin per pharmacy for osteomyelitis of the spine.  See full note from Graylin Shiver, [facility] for full details  Vancomycin trough 18 mcg/mL on 750 mg q8 hr Renal function stable  Plan: Continue vancomycin 750 mg IV q8 hr Continue meropenem as ordered  Reuel Boom, PharmD, BCPS Pager: [phone] 04/30/2015, 7:34 PM

## 2015-04-30 NOTE — Progress Notes (Addendum)
Patient ID: [name], female   DOB: [birth year], 69 y.o.   MRN: [id number]         [facility]    Date of Admission:  04/26/2015           Day 5 vancomycin        Day 3 meropenem  Principal Problem:   Osteomyelitis of cervical spine Active Problems:   Mediastinal abscess   Abscess of neck   Recurrent dislocation of hip joint prosthesis   Dysphagia, pharyngoesophageal phase   Diabetes mellitus type 2, insulin dependent   COPD (chronic obstructive pulmonary disease)   Hyperlipidemia   Hypertension   Hypokalemia   Hyponatremia   Hip dislocation, right   Healthcare-associated pneumonia   Cholecystitis, acute   Esophageal perforation   Loss of weight   . enoxaparin (LOVENOX) injection  40 mg Subcutaneous Q24H  . gabapentin  600 mg Oral TID  . insulin aspart  0-15 Units Subcutaneous 6 times per day  . lidocaine  15 mL Mouth/Throat Once  . losartan  25 mg Oral Daily  . meropenem (MERREM) IV  1 g Intravenous Q8H  . morphine  30 mg Oral Q12H  . saccharomyces boulardii  250 mg Oral BID  . simvastatin  10 mg Oral QPM  . vancomycin  750 mg Intravenous Q8H    OBJECTIVE: Filed Vitals:   04/30/15 0400 04/30/15 0450 04/30/15 0600 04/30/15 0800  BP: 173/73 156/77 147/76 160/82  Pulse: 68 70 59 61  Temp: 97.6 F (36.4 C)   97.8 F (36.6 C)  TempSrc: Oral   Oral  Resp: 19 17 17 14   Height:      Weight: 123 lb 3.8 oz (55.9 kg)     SpO2: 94% 95% 98% 96%   Body mass index is 22.53 kg/(m^2).  Ms. Revoir is currently out of her room for repeat MRI scan  Lab Results Lab Results  Component Value Date   WBC 8.2 04/29/2015   HGB 10.2* 04/29/2015   HCT 30.2* 04/29/2015   MCV 81.0 04/29/2015   PLT 256 04/29/2015    Lab Results  Component Value Date   CREATININE 0.32* 04/30/2015   BUN <5* 04/30/2015   NA 133* 04/30/2015   K 3.5 04/30/2015   CL 99* 04/30/2015   CO2 26 04/30/2015    Lab Results  Component Value Date   ALT 10* 04/28/2015     AST 14* 04/28/2015   ALKPHOS 111 04/28/2015   BILITOT 0.9 04/28/2015     Microbiology: Recent Results (from the past 240 hour(s))  Blood Culture (routine x 2)     Status: None (Preliminary result)   Collection Time: 04/26/15  5:27 PM  Result Value Ref Range Status   Specimen Description BLOOD LEFT FOREARM  Final   Special Requests BOTTLES DRAWN AEROBIC AND ANAEROBIC 5CC  Final   Culture   Final    NO GROWTH 3 DAYS Performed at [facility]    Report Status PENDING  Incomplete  Blood Culture (routine x 2)     Status: None (Preliminary result)   Collection Time: 04/26/15  5:29 PM  Result Value Ref Range Status   Specimen Description BLOOD LEFT ANTECUBITAL  Final   Special Requests BOTTLES DRAWN AEROBIC AND ANAEROBIC 5CC  Final   Culture   Final    NO GROWTH 3 DAYS Performed at [facility]    Report Status PENDING  Incomplete  Urine culture  Status: None   Collection Time: 04/26/15  5:48 PM  Result Value Ref Range Status   Specimen Description URINE, CLEAN CATCH  Final   Special Requests NONE  Final   Culture   Final    NO GROWTH 2 DAYS Performed at [facility]    Report Status 04/28/2015 FINAL  Final  MRSA PCR Screening     Status: None   Collection Time: 04/27/15 12:25 AM  Result Value Ref Range Status   MRSA by PCR NEGATIVE NEGATIVE Final    Comment:        The GeneXpert MRSA Assay (FDA approved for NASAL specimens only), is one component of a comprehensive MRSA colonization surveillance program. It is not intended to diagnose MRSA infection nor to guide or monitor treatment for MRSA infections.   C difficile quick scan w PCR reflex     Status: None   Collection Time: 04/28/15  3:10 AM  Result Value Ref Range Status   C Diff antigen NEGATIVE NEGATIVE Final   C Diff toxin NEGATIVE NEGATIVE Final   C Diff interpretation Negative for toxigenic C. difficile  Final    Ms. Haessly has cervical spine infection with a small abscess at  the level of T1. Given that this probably occurred from an esophageal perforation near her cervical fixation hardware I would continue broad empiric therapy with vancomycin and meropenem.    Cliffton Asters, MD [facility] [phone] pager   [phone] cell 04/30/2015, 10:43 AM

## 2015-04-30 NOTE — Care Management Important Message (Signed)
Important Message  Patient Details  Name: JERRIANN SCHROM MRN: [id number] Date of Birth: 1946/01/05   Medicare Important Message Given:  Yes-second notification given    Shelda Altes 04/30/2015, 4:28 [place] Message  Patient Details  Name: MELDA MERMELSTEIN MRN: [id number] Date of Birth: June 30, 1946   Medicare Important Message Given:  Yes-second notification given    Shelda Altes 04/30/2015, 4:28 PM

## 2015-04-30 NOTE — Progress Notes (Signed)
ANTIBIOTIC CONSULT NOTE   Pharmacy Consult for Vancomycin & Meropenem Indication: discitis, epidural abscess, PNA  Allergies  Allergen Reactions  . Zithromax [Azithromycin Dihydrate]     ORAL ULCERS   . Penicillins Rash   Patient Measurements: Height: 5\' 2"  (157.5 cm) Weight: 123 lb 3.8 oz (55.9 kg) IBW/kg (Calculated) : 50.1  Vital Signs: Temp: 97.8 F (36.6 C) (08/15 0800) Temp Source: Oral (08/15 0800) BP: 160/82 mmHg (08/15 0800) Pulse Rate: 61 (08/15 0800) Intake/Output from previous day: 08/14 0701 - 08/15 0700 In: 3085 [P.O.:660; I.V.:1575; IV Piggyback:850] Out: 5125 [Urine:5125] Intake/Output from this shift:    Labs:  Recent Labs  04/28/15 0350 04/29/15 0335 04/29/15 1810 04/30/15 0405  WBC 7.8 8.2  --   --   HGB 9.9* 10.2*  --   --   PLT 231 256  --   --   CREATININE 0.39* 0.31* 0.33* 0.32*   Estimated Creatinine Clearance: 54 mL/min (by C-G formula based on Cr of 0.32).  Recent Labs  04/28/15 1720  [place] 4*    Microbiology: Recent Results (from the past 720 hour(s))  Blood Culture (routine x 2)     Status: None (Preliminary result)   Collection Time: 04/26/15  5:27 PM  Result Value Ref Range Status   Specimen Description BLOOD LEFT FOREARM  Final   Special Requests BOTTLES DRAWN AEROBIC AND ANAEROBIC 5CC  Final   Culture   Final    NO GROWTH 3 DAYS Performed at [facility]    Report Status PENDING  Incomplete  Blood Culture (routine x 2)     Status: None (Preliminary result)   Collection Time: 04/26/15  5:29 PM  Result Value Ref Range Status   Specimen Description BLOOD LEFT ANTECUBITAL  Final   Special Requests BOTTLES DRAWN AEROBIC AND ANAEROBIC 5CC  Final   Culture   Final    NO GROWTH 3 DAYS Performed at [facility]    Report Status PENDING  Incomplete  Urine culture     Status: None   Collection Time: 04/26/15  5:48 PM  Result Value Ref Range Status   Specimen Description URINE, CLEAN CATCH  Final    Special Requests NONE  Final   Culture   Final    NO GROWTH 2 DAYS Performed at [facility]    Report Status 04/28/2015 FINAL  Final  MRSA PCR Screening     Status: None   Collection Time: 04/27/15 12:25 AM  Result Value Ref Range Status   MRSA by PCR NEGATIVE NEGATIVE Final    Comment:        The GeneXpert MRSA Assay (FDA approved for NASAL specimens only), is one component of a comprehensive MRSA colonization surveillance program. It is not intended to diagnose MRSA infection nor to guide or monitor treatment for MRSA infections.   C difficile quick scan w PCR reflex     Status: None   Collection Time: 04/28/15  3:10 AM  Result Value Ref Range Status   C Diff antigen NEGATIVE NEGATIVE Final   C Diff toxin NEGATIVE NEGATIVE Final   C Diff interpretation Negative for toxigenic C. difficile  Final   Medical History: Past Medical History  Diagnosis Date  . Myocardial infarction 2000  . Diabetes mellitus type 2, insulin dependent   . COPD (chronic obstructive pulmonary disease)   . Hyperlipidemia   . OA (osteoarthritis) of knee   . Hypertension   . Coronary artery disease   . Asthma   .  Elevated LFTs   . ETOH abuse   . Tobacco abuse   . Osteoarthritis   . Breast cancer    Assessment: 32 YOF presents with dislocated hip and fever. S/p R hip closed reduction on 8/11.  Antibiotics were initiated an admit for PNA.  Neck MRI on 8/12 showed diskitis and epidural abscess.  Plan for possible transfer to [facility] in case surgery is needed.  To continue current abx per othro.  Today, 04/30/2015:  Afebrile  Wbc down to wnl  Scr down 0.32 (crcl~54)  8/11 >> vancomycin  >> 8/13 >> Meropenem >> 8/11 >> aztreonam >>  8/13 8/12 >> cipro >> 8/13  8/11 blood x2:ngtd 8/11 urine:  ng-final 8/12 strep urine: neg 8/13 cdiff antigen and toxin both negative   Vanc trough 8/13 4 mcg/ml on 500mg  q12, increased to 750mg  q8h  Meropenem 1gm q8hr   Goal of Therapy:   Vancomycin trough level 15-20 mcg/ml  Plan:   Repeat Vanc trough this afternoon  No change Meropenem  Minda Ditto PharmD Pager [phone] 04/30/2015, 10:34 AM

## 2015-04-30 NOTE — Progress Notes (Signed)
Physical Therapy Treatment Patient Details Name: [name] MRN: [id number] DOB: 1946/05/08 Today's Date: 04/30/2015    History of Present Illness  [name] is a 69 y.o. 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 Shon Baton;  past medical history of CAD, status post MI, insulin requiring type 2 diabetes, COPD, tobacco abuse disorder, EtOH abuse, hypertension, hyperlipidemia, history of breast cancer     PT Comments    Noted increased pt motivation and activity tolerance but pt requiring frequent cues for THP and with poor compliance to same.  Follow Up Recommendations  Home health PT     Equipment Recommendations  None recommended by PT    Recommendations for Other Services OT consult     Precautions / Restrictions Precautions Precautions: Back;Posterior Hip Precaution Comments: Pt aware of THP but demonstrating min attempts to comply with same. Required Braces or Orthoses: Knee Immobilizer - Right Knee Immobilizer - Right: On at all times Restrictions Weight Bearing Restrictions: Yes RLE Weight Bearing: Weight bearing as tolerated    Mobility  Bed Mobility Overal bed mobility: Needs Assistance Bed Mobility: Supine to Sit     Supine to sit: Min assist;+2 for safety/equipment     General bed mobility comments: min assist for R LE and constant VC for compliance with THP  Transfers Overall transfer level: Needs assistance Equipment used: Rolling walker (2 wheeled) Transfers: Sit to/from Stand Sit to Stand: Min assist;+2 safety/equipment         General transfer comment: multi-modal cues for safety, THP and hand placement, stands with trunk flexed  Ambulation/Gait Ambulation/Gait assistance: Min assist;+2 safety/equipment Ambulation Distance (Feet): 74 Feet Assistive device:  Rolling walker (2 wheeled) Gait Pattern/deviations: Step-through pattern;Decreased step length - right;Decreased step length - left;Shuffle;Trunk flexed     General Gait Details: cues for posture and position from [employer]            Wheelchair Mobility    Modified Rankin (Stroke Patients Only)       Balance                                    Cognition Arousal/Alertness: Awake/alert Behavior During Therapy: WFL for tasks assessed/performed Overall Cognitive Status: Within Functional Limits for tasks assessed                      Exercises      General Comments        Pertinent Vitals/Pain Pain Assessment: 0-10 Pain Score: 6  Pain Location: back/hip with movement Pain Descriptors / Indicators: Sore Pain Intervention(s): Limited activity within patient's tolerance;Monitored during session    Home Living                      Prior Function            PT Goals (current goals can now be found in the care plan section) Acute Rehab PT Goals Patient Stated Goal: Home Progress towards PT goals: Progressing toward goals    Frequency  Min 3X/week    PT Plan Current plan remains appropriate    Co-evaluation             End of Session Equipment Utilized During  Treatment: Oxygen Activity Tolerance: Patient limited by fatigue Patient left: in chair;with call bell/phone within reach     Time: 0835-0858 PT Time Calculation (min) (ACUTE ONLY): 23 min  Charges:  $Gait Training: 23-37 mins                    G Codes:      Bogdan Vivona 24-May-2015, 11:33 AM

## 2015-04-30 NOTE — Assessment & Plan Note (Signed)
[name] has cervical spine infection with a small abscess at the level of T1. Given that this probably occurred from an esophageal perforation near her cervical fixation hardware I would continue broad empiric therapy with vancomycin and meropenem.

## 2015-05-01 ENCOUNTER — Inpatient Hospital Stay (HOSPITAL_COMMUNITY): Payer: Medicare Other

## 2015-05-01 DIAGNOSIS — M4640 Discitis, unspecified, site unspecified: Secondary | ICD-10-CM | POA: Insufficient documentation

## 2015-05-01 DIAGNOSIS — L0291 Cutaneous abscess, unspecified: Secondary | ICD-10-CM | POA: Insufficient documentation

## 2015-05-01 DIAGNOSIS — J189 Pneumonia, unspecified organism: Secondary | ICD-10-CM | POA: Insufficient documentation

## 2015-05-01 LAB — BASIC METABOLIC PANEL
ANION GAP: 8 (ref 5–15)
BUN: 5 mg/dL — AB (ref 6–20)
CALCIUM: 8.2 mg/dL — AB (ref 8.9–10.3)
CO2: 26 mmol/L (ref 22–32)
Chloride: 99 mmol/L — ABNORMAL LOW (ref 101–111)
Creatinine, Ser: 0.33 mg/dL — ABNORMAL LOW (ref 0.44–1.00)
GFR calc Af Amer: >60 mL/min (ref >60)
GFR calc non Af Amer: >60 mL/min (ref >60)
GLUCOSE: 111 mg/dL — AB (ref 65–99)
Potassium: 3.4 mmol/L — ABNORMAL LOW (ref 3.5–5.1)
Sodium: 133 mmol/L — ABNORMAL LOW (ref 135–145)

## 2015-05-01 LAB — GLUCOSE, CAPILLARY
GLUCOSE-CAPILLARY: 104 mg/dL — AB (ref 65–99)
GLUCOSE-CAPILLARY: 133 mg/dL — AB (ref 65–99)
GLUCOSE-CAPILLARY: 127 mg/dL — AB (ref 65–99)
Glucose-Capillary: 121 mg/dL — ABNORMAL HIGH (ref 65–99)

## 2015-05-01 LAB — CULTURE, BLOOD (ROUTINE X 2)
CULTURE: NO GROWTH
CULTURE: NO GROWTH

## 2015-05-01 LAB — CBC
HEMATOCRIT: 34.5 % — AB (ref 36.0–46.0)
Hemoglobin: 11.5 g/dL — ABNORMAL LOW (ref 12.0–15.0)
MCH: 27 pg (ref 26.0–34.0)
MCHC: 33.3 g/dL (ref 30.0–36.0)
MCV: 81 fL (ref 78.0–100.0)
Platelets: 291 10*3/uL (ref 150–400)
RBC: 4.26 MIL/uL (ref 3.87–5.11)
RDW: 14.9 % (ref 11.5–15.5)
WBC: 11 10*3/uL — ABNORMAL HIGH (ref 4.0–10.5)

## 2015-05-01 MED ORDER — IOHEXOL 300 MG/ML  SOLN: 150.0000 mL | Freq: Once | INTRAMUSCULAR | Status: DC | PRN

## 2015-05-01 MED ORDER — OXYCODONE HCL 5 MG PO TABS
5.0000 mg | ORAL_TABLET | Freq: Four times a day (QID) | ORAL | Status: DC | PRN
  Administered 2015-05-01 – 2015-05-02 (×6): 10 mg via ORAL
  Filled 2015-05-01 (×6): qty 2

## 2015-05-01 MED ORDER — POTASSIUM CHLORIDE CRYS ER 20 MEQ PO TBCR
40.0000 meq | EXTENDED_RELEASE_TABLET | Freq: Once | ORAL | Status: AC
  Administered 2015-05-01: 40 meq via ORAL
  Filled 2015-05-01: qty 2

## 2015-05-01 NOTE — Progress Notes (Signed)
OT Cancellation Note  Patient Details Name: [name] MRN: [id number] DOB: 10-07-1945   Cancelled Treatment:    Reason Eval/Treat Not Completed: Other (comment) Pt refused OOB at this time. She states she is uncomfortable in the bed but when offered to be assisted OOB to chair, pt stating, "I will be uncomfortable there too." Despite encouragement, pt repeatedly stating "not right now" for OOB activity.  [place], [place] 05/01/2015, 9:13 AM

## 2015-05-01 NOTE — Progress Notes (Signed)
[place] [facility]    Subjective: Wants to go home  Antibiotics:  Anti-infectives    Start     Dose/Rate Route Frequency Ordered Stop   04/29/15 0200  vancomycin (VANCOCIN) IVPB 750 mg/150 ml premix  Status:  Discontinued     750 mg 150 mL/hr over 60 Minutes Intravenous Every 8 hours 04/28/15 1816 05/01/15 1217   04/28/15 1900  vancomycin (VANCOCIN) 500 mg in sodium chloride 0.9 % 100 mL IVPB     500 mg 100 mL/hr over 60 Minutes Intravenous  Once 04/28/15 1815 04/28/15 1940   04/28/15 1400  meropenem (MERREM) 1 g in sodium chloride 0.9 % 100 mL IVPB  Status:  Discontinued     1 g 200 mL/hr over 30 Minutes Intravenous Every 8 hours 04/28/15 1312 05/01/15 1217   04/27/15 1400  ciprofloxacin (CIPRO) IVPB 400 mg  Status:  Discontinued     400 mg 200 mL/hr over 60 Minutes Intravenous Every 12 hours 04/27/15 0528 04/28/15 1302   04/27/15 0600  vancomycin (VANCOCIN) 500 mg in sodium chloride 0.9 % 100 mL IVPB  Status:  Discontinued     500 mg 100 mL/hr over 60 Minutes Intravenous Every 12 hours 04/26/15 1827 04/26/15 2358   04/27/15 0600  aztreonam (AZACTAM) 1 g in dextrose 5 % 50 mL IVPB  Status:  Discontinued     1 g 100 mL/hr over 30 Minutes Intravenous 3 times per day 04/26/15 1827 04/26/15 2358   04/27/15 0600  vancomycin (VANCOCIN) 500 mg in sodium chloride 0.9 % 100 mL IVPB  Status:  Discontinued     500 mg 100 mL/hr over 60 Minutes Intravenous Every 12 hours 04/27/15 0026 04/28/15 1815   04/27/15 0200  aztreonam (AZACTAM) 2 g in dextrose 5 % 50 mL IVPB  Status:  Discontinued     2 g 100 mL/hr over 30 Minutes Intravenous Every 8 hours 04/26/15 2358 04/28/15 1302   04/27/15 0000  ciprofloxacin (CIPRO) IVPB 200 mg  Status:  Discontinued     200 mg 100 mL/hr over 60 Minutes Intravenous Every 12 hours 04/26/15 2358 04/27/15 0528   04/26/15 1715  aztreonam (AZACTAM) 2 g in dextrose 5 % 50 mL IVPB     2 g 100 mL/hr over 30 Minutes Intravenous   Once 04/26/15 1711 04/26/15 1838   04/26/15 1715  vancomycin (VANCOCIN) IVPB 1000 mg/200 mL premix     1,000 mg 200 mL/hr over 60 Minutes Intravenous  Once 04/26/15 1711 04/26/15 1838      Medications: Scheduled Meds: . enoxaparin (LOVENOX) injection  40 mg Subcutaneous Q24H  . gabapentin  600 mg Oral TID  . insulin aspart  0-15 Units Subcutaneous TID WC  . lidocaine  15 mL Mouth/Throat Once  . losartan  25 mg Oral Daily  . morphine  30 mg Oral Q12H  . potassium chloride  40 mEq Oral Once  . saccharomyces boulardii  250 mg Oral BID  . simvastatin  10 mg Oral QPM   Continuous Infusions: . 0.9 % NaCl with KCl 20 mEq / L 75 mL/hr at 05/01/15 0836   PRN Meds:.acetaminophen **OR** acetaminophen, albuterol, ALPRAZolam, diclofenac, iohexol, ipratropium, metoCLOPramide **OR** metoCLOPramide (REGLAN) injection, ondansetron **OR** ondansetron (ZOFRAN) IV, ondansetron **OR** ondansetron (ZOFRAN) IV, oxyCODONE, tiZANidine    Objective: Weight change:   Intake/Output Summary (Last 24 hours) at 05/01/15 1217 Last data filed at 05/01/15 0951  Gross per 24 hour  Intake 1991.25 ml  Output   2150 ml  Net -158.75 ml   Blood pressure 151/87, pulse 70, temperature 97.7 F (36.5 C), temperature source Oral, resp. rate 16, height 5\' 2"  (1.575 m), weight 123 lb 3.8 oz (55.9 kg), SpO2 96 %. Temp:  [97.7 F (36.5 C)-98.3 F (36.8 C)] 97.7 F (36.5 C) (08/16 0523) Pulse Rate:  [70-80] 70 (08/16 0523) Resp:  [16-20] 16 (08/16 0523) BP: (147-157)/(79-109) 151/87 mmHg (08/16 0523) SpO2:  [96 %-99 %] 96 % (08/16 0523)  Physical Exam: General: Alert and awake,not in any acute distress. HEENT: anicteric sclera, pupils reactive to light and accommodation, EOMI, surgical scar is clean CVregular rate, normal r, no murmur rubs or gallops Chest: clear to auscultation bilaterally, no wheezing, rales or rhonchi Abdomen: soft nontender, nondistended, normal bowel sounds, Extremities: no clubbing or  edema noted bilaterally Skin: no rashes Neuro: nonfocal, strength and sensation intact   CBC: CBC Latest Ref Rng 05/01/2015 04/29/2015 04/28/2015  WBC 4.0 - 10.5 K/uL 11.0(H) 8.2 7.8  Hemoglobin 12.0 - 15.0 g/dL 11.5(L) 10.2(L) 9.9(L)  Hematocrit 36.0 - 46.0 % 34.5(L) 30.2(L) 29.3(L)  Platelets 150 - 400 K/uL 291 256 231      BMET  Recent Labs  04/30/15 0405 05/01/15 0516  NA 133* 133*  K 3.5 3.4*  CL 99* 99*  CO2 26 26  GLUCOSE 130* 111*  BUN <5* 5*  CREATININE 0.32* 0.33*  CALCIUM 8.3* 8.2*     Liver Panel  No results for input(s): PROT, ALBUMIN, AST, ALT, ALKPHOS, BILITOT, BILIDIR, IBILI in the last 72 hours.     Sedimentation Rate  Recent Labs  04/29/15 0335  ESRSEDRATE 85*   C-Reactive Protein  Recent Labs  04/29/15 0310  CRP 18.3*    Micro Results: Recent Results (from the past 720 hour(s))  Blood Culture (routine x 2)     Status: None (Preliminary result)   Collection Time: 04/26/15  5:27 PM  Result Value Ref Range Status   Specimen Description BLOOD LEFT FOREARM  Final   Special Requests BOTTLES DRAWN AEROBIC AND ANAEROBIC 5CC  Final   Culture   Final    NO GROWTH 4 DAYS Performed at [facility]    Report Status PENDING  Incomplete  Blood Culture (routine x 2)     Status: None (Preliminary result)   Collection Time: 04/26/15  5:29 PM  Result Value Ref Range Status   Specimen Description BLOOD LEFT ANTECUBITAL  Final   Special Requests BOTTLES DRAWN AEROBIC AND ANAEROBIC 5CC  Final   Culture   Final    NO GROWTH 4 DAYS Performed at [facility]    Report Status PENDING  Incomplete  Urine culture     Status: None   Collection Time: 04/26/15  5:48 PM  Result Value Ref Range Status   Specimen Description URINE, CLEAN CATCH  Final   Special Requests NONE  Final   Culture   Final    NO GROWTH 2 DAYS Performed at [facility]    Report Status 04/28/2015 FINAL  Final  MRSA PCR Screening     Status: None    Collection Time: 04/27/15 12:25 AM  Result Value Ref Range Status   MRSA by PCR NEGATIVE NEGATIVE Final    Comment:        The GeneXpert MRSA Assay (FDA approved for NASAL specimens only), is one component of a comprehensive MRSA colonization surveillance program. It is not intended to diagnose MRSA infection  nor to guide or monitor treatment for MRSA infections.   C difficile quick scan w PCR reflex     Status: None   Collection Time: 04/28/15  3:10 AM  Result Value Ref Range Status   C Diff antigen NEGATIVE NEGATIVE Final   C Diff toxin NEGATIVE NEGATIVE Final   C Diff interpretation Negative for toxigenic C. difficile  Final    Studies/Results: Mr Cervical Spine W Wo Contrast  04/30/2015   CLINICAL DATA:  Followup C3-4 infection  EXAM: MRI CERVICAL SPINE WITHOUT AND WITH CONTRAST  TECHNIQUE: Multiplanar and multiecho pulse sequences of the cervical spine, to include the craniocervical junction and cervicothoracic junction, were obtained according to standard protocol without and with intravenous contrast.  CONTRAST:  27mL MULTIHANCE GADOBENATE DIMEGLUMINE 529 MG/ML IV SOLN  COMPARISON:  MRI 3 days ago.  CT 3 days ago.  FINDINGS: No change by imaging at this point. The foramen magnum is widely patent. C1-2 and C2-3 are unremarkable.  C3-4: Findings consistent with discitis osteomyelitis at this level. Retrolisthesis of 2 mm. Endplate osteophytes. Narrowing of the canal with AP diameter of 5 mm. Effacement of the subarachnoid space surrounding the cord. Slight deformity of the cord. No cord edema. Some epidural enhancement at this level consistent with epidural inflammation but no evidence of compressive epidural abscess or fluid collection.  Previous fusion from C4 through C6 with strut graft. Sufficient patency of the canal through that region. No epidural collection.  C6-7: Facet degeneration with 1 or 2 mm of anterolisthesis. No compressive canal stenosis. Mild foraminal narrowing  bilaterally.  C7-T1: Abnormal edema and enhancement of the C7 vertebral body worrisome for osteomyelitis at this level as well. Abnormal fluid anterior and to the left of the C7 vertebral body consistent with adjacent soft tissue abscess.  IMPRESSION: No worrisome intraspinal change since the study of 3 days ago. Continuing evidence of discitis osteomyelitis at C3-4. Spinal canal narrowing with an AP diameter of 5 mm. To some deformity of the cord without cord edema. Epidural enhancement without evidence of drainable epidural collection.  Abnormal edema and enhancement of the C7 vertebral body consistent with osteomyelitis of that level as well.  Abnormal edema in the retropharyngeal tissues including a fluid collection anterior and to the left of the C7 vertebral body as seen previously.   Electronically Signed   By: Nelson Chimes M.D.   On: 04/30/2015 10:56   Dg Esophagus W/water Sol Cm  04/30/2015   CLINICAL DATA:  Postop from cervical spine fusion. Severe neck pain and cervical spine osteomyelitis. Evaluate for esophageal perforation.  EXAM: ESOPHOGRAM/BARIUM SWALLOW  TECHNIQUE: Single contrast examination was performed using 50 mL water-soluble Omnipaque 300.  FLUOROSCOPY TIME:  Fluoroscopy Time:  2 minutes 52 seconds  Number of Acquired Images:  7  COMPARISON:  None.  FINDINGS: Exam was technically difficult due to severe patient pain, limited mobility, and limited ability to cooperate with positioning.  Anterior cervical spine fixation hardware is noted. There is no evidence of esophageal contrast leak or perforation. No evidence of esophageal obstruction or dilatation.  IMPRESSION: Technically difficult exam. No evidence of esophageal leak or perforation.   Electronically Signed   By: Earle Gell M.D.   On: 04/30/2015 12:34      Assessment/Plan:  Principal Problem:   Osteomyelitis of cervical spine Active Problems:   Diabetes mellitus type 2, insulin dependent   COPD (chronic obstructive  pulmonary disease)   Hyperlipidemia   Hypertension   Hypokalemia   Hyponatremia  Recurrent dislocation of hip joint prosthesis   Hip dislocation, right   Healthcare-associated pneumonia   Cholecystitis, acute   Mediastinal abscess   Abscess of neck   Esophageal perforation   Loss of weight   Dysphagia, pharyngoesophageal phase    [name] is a 69 y.o. 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   #1 Cervical discitis with Stenosis and epidural abscess:  MRI without changes  [facility] TAKING THE PATIENT TO THE OR ON Thursday AT [facility]  She is to have gastrografin study   I WILL STOP HER EXTREMELY BROAD SPECTRUM ANTIBIOTICS FOR NOW TO GIVE [place] A CHANCE AT ISOLATING AN ORGANISM FROM DEEP CULTURES FROM HER ABSCESS AND TISSUES, DISK, HARDWARE ON Thursday IN 48 HOURS  We can resume them immediately postop    #2 70 pound weight loss: potentially due to esophageal problems given the fact that she has had dysphagia since November 2015.  #3 prosthetic hip with recurrent dislocations: Could this prosthetic hip be infected may want to consider imaging the hip with MRI versus CT scan to ensure that it is not also a nidus of infection or site that is become seeded through her blood.  Bigger issue obviously the major problem with her neck, will consider this after she gets through that surgery   Dr. Megan Salon is covering at [facility] so I will add her to his list and tell him she is coming once she is transferred.    LOS: 5 days   Alcide Evener 05/01/2015, 12:17 PM

## 2015-05-01 NOTE — Progress Notes (Signed)
05/01/2015 9:44 AM  [name] [id number]      Temp:  [97.7 F (36.5 C)-98.3 F (36.8 C)] 97.7 F (36.5 C) (08/16 0523) Pulse Rate:  [70-80] 70 (08/16 0523) Resp:  [16-20] 16 (08/16 0523) BP: (133-157)/(62-109) 151/87 mmHg (08/16 0523) SpO2:  [96 %-99 %] 96 % (08/16 0523),     Intake/Output Summary (Last 24 hours) at 05/01/15 0944 Last data filed at 05/01/15 0529  Gross per 24 hour  Intake   2010 ml  Output   2200 ml  Net   -190 ml    Results for orders placed or performed during the hospital encounter of 04/26/15 (from the past 24 hour(s))  Glucose, capillary     Status: Abnormal   Collection Time: 04/30/15 12:20 PM  Result Value Ref Range   Glucose-Capillary 109 (H) 65 - 99 mg/dL  Vancomycin, trough     Status: None   Collection Time: 04/30/15  5:15 PM  Result Value Ref Range   Vancomycin Tr 18 10.0 - 20.0 ug/mL  Glucose, capillary     Status: Abnormal   Collection Time: 04/30/15 10:06 PM  Result Value Ref Range   Glucose-Capillary 121 (H) 65 - 99 mg/dL  CBC     Status: Abnormal   Collection Time: 05/01/15  5:16 AM  Result Value Ref Range   WBC 11.0 (H) 4.0 - 10.5 K/uL   RBC 4.26 3.87 - 5.11 MIL/uL   Hemoglobin 11.5 (L) 12.0 - 15.0 g/dL   HCT 34.5 (L) 36.0 - 46.0 %   MCV 81.0 78.0 - 100.0 fL   MCH 27.0 26.0 - 34.0 pg   MCHC 33.3 30.0 - 36.0 g/dL   RDW 14.9 11.5 - 15.5 %   Platelets 291 150 - 400 K/uL  Basic metabolic panel     Status: Abnormal   Collection Time: 05/01/15  5:16 AM  Result Value Ref Range   Sodium 133 (L) 135 - 145 mmol/L   Potassium 3.4 (L) 3.5 - 5.1 mmol/L   Chloride 99 (L) 101 - 111 mmol/L   CO2 26 22 - 32 mmol/L   Glucose, Bld 111 (H) 65 - 99 mg/dL   BUN 5 (L) 6 - 20 mg/dL   Creatinine, Ser 0.33 (L) 0.44 - 1.00 mg/dL   Calcium 8.2 (L) 8.9 - 10.3 mg/dL   GFR calc non Af Amer >60 >60 mL/min   GFR calc Af Amer >60 >60 mL/min   Anion gap 8 5 - 15  Glucose, capillary     Status: Abnormal   Collection Time: 05/01/15  7:38 AM  Result  Value Ref Range   Glucose-Capillary 121 (H) 65 - 99 mg/dL    SUBJECTIVE:  Swallowing better.  No neck pain,  Would like to go home briefly before surgery on Thursday.  OBJECTIVE:  No change in neck exam.  Much more mentally alert and conversant.  Breathing easily. Voice clear.  IMPRESSION:  Satisfactory check  PLAN:  Will re-attempt gastrografin swallow now that she is taking better po intake.  She would like to go home today, but I did not promise this for her.    I had independently planned esophagoscopy on Thursday.  Dr. Rolena Infante is planning neck exploration with removal of cervical anterior fusion plate on Thursday.  Will coordinate with him.  Discussed with patient.    I would have expected her to be much sicker a long time ago if she had a frank esophageal fistula.  If there is a walled  off loculation, then the esophagoscopy could open this up.  This is not important if we are already going to be in her neck and can achieve adequate external drainage.    Jodi Marble

## 2015-05-01 NOTE — Progress Notes (Signed)
[facility] PROGRESS NOTE  [name] OZH:[id number] DOB: December 27, 1945 DOA: 04/26/2015  PCP: Redmond Baseman, MD  Brief HPI: 69 year old Caucasian female with a past medical history of coronary artery disease, insulin-dependent diabetes, COPD, history of breast cancer presented with right hip pain. She was noted to have a right hip prosthesis dislocation. She was also noted to have a fever. She was recently treated for pneumonia with Levaquin. CT scan of the chest revealed fluid collection in the lower neck and upper chest area. Patient was hospitalized for further management. She had a closed reduction of her hip dislocation. MRI of the cervical spine revealed osteomyelitis of her cervical spine with gas bubbles.  Past medical history:  Past Medical History  Diagnosis Date  . Myocardial infarction 2000  . Diabetes mellitus type 2, insulin dependent   . COPD (chronic obstructive pulmonary disease)   . Hyperlipidemia   . OA (osteoarthritis) of knee   . Hypertension   . Coronary artery disease   . Asthma   . Elevated LFTs   . ETOH abuse   . Tobacco abuse   . Osteoarthritis   . Breast cancer     Consultants: Orthopedics, ENT, infectious diseases  Procedures: Closed reduction of the right hip dislocation 8/12  Antibiotics: Vancomycin, aztreonam, ciprofloxacin 8/11--8/13 Vancomycin and Meropenem 8/14  Subjective: Patient continues to feel well. Does have some stiffness in her neck which she states is chronic. She would like to go home. Explained to her the need for hospitalization.  Objective: Vital Signs  Filed Vitals:   04/30/15 1400 04/30/15 1526 04/30/15 2131 05/01/15 0523  BP: 157/109 156/79 147/89 151/87  Pulse: 80 70 70 70  Temp:  97.8 F (36.6 C) 98.3 F (36.8 C) 97.7 F (36.5 C)  TempSrc:  Oral Oral Oral  Resp: 18 20 18 16   Height:      Weight:      SpO2: 97% 99% 97% 96%    Intake/Output Summary (Last 24 hours) at 05/01/15 1035 Last data  filed at 05/01/15 0951  Gross per 24 hour  Intake 1991.25 ml  Output   2500 ml  Net -508.75 ml   Filed Weights   04/26/15 1624 04/27/15 0000 04/30/15 0400  Weight: 60.328 kg (133 lb) 59.3 kg (130 lb 11.7 oz) 55.9 kg (123 lb 3.8 oz)    General appearance: Awake and alert. In no distress. Resp: Good air entry bilaterally. No definite crackles or wheezing. Cardio: regular rate and rhythm, S1, S2 normal, no murmur, click, rub or gallop GI: soft, non-tender; bowel sounds normal; no masses,  no organomegaly Neurologic: No focal deficits. Moving all her extremities   Lab Results:  Basic Metabolic Panel:  Recent Labs Lab 04/27/15 0320 04/28/15 0350 04/29/15 0335 04/29/15 1810 04/30/15 0405 05/01/15 0516  NA 133* 134* 131* 132* 133* 133*  K 2.9* 3.2* 2.8* 3.6 3.5 3.4*  CL 97* 100* 96* 97* 99* 99*  CO2 29 25 23 25 26 26   GLUCOSE 110* 109* 91 137* 130* 111*  BUN 12 8 <5* <5* <5* 5*  CREATININE 0.55 0.39* 0.31* 0.33* 0.32* 0.33*  CALCIUM 7.9* 8.1* 7.9* 7.9* 8.3* 8.2*  MG 1.5* 1.7  --   --  1.6*  --   PHOS 2.3*  --   --   --   --   --    Liver Function Tests:  Recent Labs Lab 04/27/15 0320 04/28/15 0350  AST 16 14*  ALT 9* 10*  ALKPHOS 87 111  BILITOT 0.8 0.9  PROT 5.8* 5.8*  ALBUMIN 2.3* 2.1*   CBC:  Recent Labs Lab 04/26/15 1633 04/27/15 0320 04/28/15 0350 04/29/15 0335 05/01/15 0516  WBC 12.8* 9.4 7.8 8.2 11.0*  NEUTROABS 11.0* 7.3  --   --   --   HGB 10.0* 8.9* 9.9* 10.2* 11.5*  HCT 30.6* 27.5* 29.3* 30.2* 34.5*  MCV 83.8 83.6 83.0 81.0 81.0  PLT 232 181 231 256 291    CBG:  Recent Labs Lab 04/30/15 0446 04/30/15 0758 04/30/15 1220 04/30/15 2206 05/01/15 0738  GLUCAP 124* 121* 109* 121* 121*    Recent Results (from the past 240 hour(s))  Blood Culture (routine x 2)     Status: None (Preliminary result)   Collection Time: 04/26/15  5:27 PM  Result Value Ref Range Status   Specimen Description BLOOD LEFT FOREARM  Final   Special Requests  BOTTLES DRAWN AEROBIC AND ANAEROBIC 5CC  Final   Culture   Final    NO GROWTH 4 DAYS Performed at [facility]    Report Status PENDING  Incomplete  Blood Culture (routine x 2)     Status: None (Preliminary result)   Collection Time: 04/26/15  5:29 PM  Result Value Ref Range Status   Specimen Description BLOOD LEFT ANTECUBITAL  Final   Special Requests BOTTLES DRAWN AEROBIC AND ANAEROBIC 5CC  Final   Culture   Final    NO GROWTH 4 DAYS Performed at [facility]    Report Status PENDING  Incomplete  Urine culture     Status: None   Collection Time: 04/26/15  5:48 PM  Result Value Ref Range Status   Specimen Description URINE, CLEAN CATCH  Final   Special Requests NONE  Final   Culture   Final    NO GROWTH 2 DAYS Performed at [facility]    Report Status 04/28/2015 FINAL  Final  MRSA PCR Screening     Status: None   Collection Time: 04/27/15 12:25 AM  Result Value Ref Range Status   MRSA by PCR NEGATIVE NEGATIVE Final    Comment:        The GeneXpert MRSA Assay (FDA approved for NASAL specimens only), is one component of a comprehensive MRSA colonization surveillance program. It is not intended to diagnose MRSA infection nor to guide or monitor treatment for MRSA infections.   C difficile quick scan w PCR reflex     Status: None   Collection Time: 04/28/15  3:10 AM  Result Value Ref Range Status   C Diff antigen NEGATIVE NEGATIVE Final   C Diff toxin NEGATIVE NEGATIVE Final   C Diff interpretation Negative for toxigenic C. difficile  Final      Studies/Results: Mr Cervical Spine W Wo Contrast  04/30/2015   CLINICAL DATA:  Followup C3-4 infection  EXAM: MRI CERVICAL SPINE WITHOUT AND WITH CONTRAST  TECHNIQUE: Multiplanar and multiecho pulse sequences of the cervical spine, to include the craniocervical junction and cervicothoracic junction, were obtained according to standard protocol without and with intravenous contrast.  CONTRAST:  11mL  MULTIHANCE GADOBENATE DIMEGLUMINE 529 MG/ML IV SOLN  COMPARISON:  MRI 3 days ago.  CT 3 days ago.  FINDINGS: No change by imaging at this point. The foramen magnum is widely patent. C1-2 and C2-3 are unremarkable.  C3-4: Findings consistent with discitis osteomyelitis at this level. Retrolisthesis of 2 mm. Endplate osteophytes. Narrowing of the canal with AP diameter of 5 mm. Effacement of the subarachnoid space  surrounding the cord. Slight deformity of the cord. No cord edema. Some epidural enhancement at this level consistent with epidural inflammation but no evidence of compressive epidural abscess or fluid collection.  Previous fusion from C4 through C6 with strut graft. Sufficient patency of the canal through that region. No epidural collection.  C6-7: Facet degeneration with 1 or 2 mm of anterolisthesis. No compressive canal stenosis. Mild foraminal narrowing bilaterally.  C7-T1: Abnormal edema and enhancement of the C7 vertebral body worrisome for osteomyelitis at this level as well. Abnormal fluid anterior and to the left of the C7 vertebral body consistent with adjacent soft tissue abscess.  IMPRESSION: No worrisome intraspinal change since the study of 3 days ago. Continuing evidence of discitis osteomyelitis at C3-4. Spinal canal narrowing with an AP diameter of 5 mm. To some deformity of the cord without cord edema. Epidural enhancement without evidence of drainable epidural collection.  Abnormal edema and enhancement of the C7 vertebral body consistent with osteomyelitis of that level as well.  Abnormal edema in the retropharyngeal tissues including a fluid collection anterior and to the left of the C7 vertebral body as seen previously.   Electronically Signed   By: Paulina Fusi M.D.   On: 04/30/2015 10:56   Dg Esophagus W/water Sol Cm  04/30/2015   CLINICAL DATA:  Postop from cervical spine fusion. Severe neck pain and cervical spine osteomyelitis. Evaluate for esophageal perforation.  EXAM:  ESOPHOGRAM/BARIUM SWALLOW  TECHNIQUE: Single contrast examination was performed using 50 mL water-soluble Omnipaque 300.  FLUOROSCOPY TIME:  Fluoroscopy Time:  2 minutes 52 seconds  Number of Acquired Images:  7  COMPARISON:  None.  FINDINGS: Exam was technically difficult due to severe patient pain, limited mobility, and limited ability to cooperate with positioning.  Anterior cervical spine fixation hardware is noted. There is no evidence of esophageal contrast leak or perforation. No evidence of esophageal obstruction or dilatation.  IMPRESSION: Technically difficult exam. No evidence of esophageal leak or perforation.   Electronically Signed   By: Myles Rosenthal M.D.   On: 04/30/2015 12:34    Medications:  Scheduled: . enoxaparin (LOVENOX) injection  40 mg Subcutaneous Q24H  . gabapentin  600 mg Oral TID  . insulin aspart  0-15 Units Subcutaneous TID WC  . lidocaine  15 mL Mouth/Throat Once  . losartan  25 mg Oral Daily  . meropenem (MERREM) IV  1 g Intravenous Q8H  . morphine  30 mg Oral Q12H  . saccharomyces boulardii  250 mg Oral BID  . simvastatin  10 mg Oral QPM  . vancomycin  750 mg Intravenous Q8H   Continuous: . 0.9 % NaCl with KCl 20 mEq / L 75 mL/hr at 05/01/15 4696   EXB:[id number] **OR** acetaminophen, albuterol, ALPRAZolam, diclofenac, ipratropium, metoCLOPramide **OR** metoCLOPramide (REGLAN) injection, ondansetron **OR** ondansetron (ZOFRAN) IV, ondansetron **OR** ondansetron (ZOFRAN) IV, oxyCODONE, tiZANidine  Assessment/Plan:  Principal Problem:   Osteomyelitis of cervical spine Active Problems:   Diabetes mellitus type 2, insulin dependent   COPD (chronic obstructive pulmonary disease)   Hyperlipidemia   Hypertension   Hypokalemia   Hyponatremia   Recurrent dislocation of hip joint prosthesis   Hip dislocation, right   Healthcare-associated pneumonia   Cholecystitis, acute   Mediastinal abscess   Abscess of neck   Esophageal perforation   Loss of weight    Dysphagia, pharyngoesophageal phase    Cervical spine osteomyelitis with prevertebral soft tissue edema and gas bubbles  Orthopedics is following. Dr. Shon Baton performed her cervical  surgery in the past. Initially he wanted to avoid an extensive surgical procedure if patient improves with conservative management. Patient seen by infectious disease. Antibiotics have been adjusted. Patient is hemodynamically stable at this time. Repeat MRI shows similar findings. It is unlikely that the infection will improve just with antibiotics. The plan is for surgical intervention on Thursday 8/18. Esophagogram does not show any obvious perforation.  Possible healthcare associated pneumonia Continue broad-spectrum antibiotics. Blood cultures negative so far.  Dislocation of the right hip prosthesis Seen by orthopedics. She had a closed reduction on the morning of 8/12. Management per orthopedics. Denies any pain. ID has recommended to consider MRI of the hip in case that is the source of infection. They will address this after surgery. Her hip replacement was in 2013, per patient.  Abnormal appearing gallbladder on CT scan LFTs are unremarkable. Patient remains nontender in the right upper quadrant. Ultrasound is as above. Gallbladder distention and minimal ductal dilatation was appreciated. But no cholecystitis identified. In view of the fact that her LFTs are unremarkable and the patient is asymptomatic, do not anticipate further evaluation currently. This can be addressed in the future with outpatient general surgery follow-up.  Hyponatremia, hypokalemia, hypomagnesemia  or place, potassiumHolding her diuretics.  Normocytic anemia Continue to monitor hemoglobin. Some drop in the hemoglobin is likely due to dilution.  History of coronary artery disease Stable. Continue to monitor.  History of COPD Continue nebulizer treatments. Inhalers.  History of chronic low back pain Noted to be on long-acting  narcotics at home. Since she was drowsy initially, her dose of narcotics was reduced.  Her pain has gotten worse in the last 24 hours. Adjust her pain medications. Watch for somnolence.  History of diabetes mellitus type 2 Sliding scale insulin coverage. Holding her oral agents.   DVT Prophylaxis: Lovenox    Code Status: Full code  Family Communication: Discussed with the patient.  Discussed with her husband over the phone Disposition Plan:  Patient will need to be transferred to [facility] on 8/17. Orthopedics plans surgery on 8/18.    LOS: 5 days   [facility]  [facility] Pager [phone] 05/01/2015, 10:35 AM  If 7PM-7AM, please contact night-coverage at [email or web address], password [facility]

## 2015-05-02 ENCOUNTER — Other Ambulatory Visit: Payer: Medicare Other

## 2015-05-02 DIAGNOSIS — K819 Cholecystitis, unspecified: Secondary | ICD-10-CM | POA: Insufficient documentation

## 2015-05-02 DIAGNOSIS — K2289 Other specified disease of esophagus: Secondary | ICD-10-CM | POA: Insufficient documentation

## 2015-05-02 DIAGNOSIS — K228 Other specified diseases of esophagus: Secondary | ICD-10-CM | POA: Insufficient documentation

## 2015-05-02 LAB — BASIC METABOLIC PANEL
ANION GAP: 5 (ref 5–15)
BUN: 7 mg/dL (ref 6–20)
CHLORIDE: 101 mmol/L (ref 101–111)
CO2: 27 mmol/L (ref 22–32)
Calcium: 8.4 mg/dL — ABNORMAL LOW (ref 8.9–10.3)
Creatinine, Ser: 0.35 mg/dL — ABNORMAL LOW (ref 0.44–1.00)
GFR calc Af Amer: >60 mL/min (ref >60)
GLUCOSE: 106 mg/dL — AB (ref 65–99)
POTASSIUM: 4 mmol/L (ref 3.5–5.1)
Sodium: 133 mmol/L — ABNORMAL LOW (ref 135–145)

## 2015-05-02 LAB — CBC
HEMATOCRIT: 33.7 % — AB (ref 36.0–46.0)
HEMOGLOBIN: 11 g/dL — AB (ref 12.0–15.0)
MCH: 26.8 pg (ref 26.0–34.0)
MCHC: 32.6 g/dL (ref 30.0–36.0)
MCV: 82.2 fL (ref 78.0–100.0)
Platelets: 294 10*3/uL (ref 150–400)
RBC: 4.1 MIL/uL (ref 3.87–5.11)
RDW: 14.9 % (ref 11.5–15.5)
WBC: 9.9 10*3/uL (ref 4.0–10.5)

## 2015-05-02 LAB — MAGNESIUM: Magnesium: 1.7 mg/dL (ref 1.7–2.4)

## 2015-05-02 LAB — GLUCOSE, CAPILLARY
GLUCOSE-CAPILLARY: 110 mg/dL — AB (ref 65–99)
Glucose-Capillary: 136 mg/dL — ABNORMAL HIGH (ref 65–99)
Glucose-Capillary: 109 mg/dL — ABNORMAL HIGH (ref 65–99)

## 2015-05-02 NOTE — Progress Notes (Signed)
[facility] PROGRESS NOTE  [name] ZOX:[id number] DOB: Feb 22, 1946 DOA: 04/26/2015  PCP: Redmond Baseman, MD  Brief HPI: 69 year old Caucasian female with a past medical history of coronary artery disease, insulin-dependent diabetes, COPD, history of breast cancer presented with right hip pain. She was noted to have a right hip prosthesis dislocation. She was also noted to have a fever. She was recently treated for pneumonia with Levaquin. CT scan of the chest revealed fluid collection in the lower neck and upper chest area. Patient was hospitalized for further management. She had a closed reduction of her hip dislocation. MRI of the cervical spine revealed osteomyelitis of her cervical spine with gas bubbles.  Past medical history:  Past Medical History  Diagnosis Date  . Myocardial infarction 2000  . Diabetes mellitus type 2, insulin dependent   . COPD (chronic obstructive pulmonary disease)   . Hyperlipidemia   . OA (osteoarthritis) of knee   . Hypertension   . Coronary artery disease   . Asthma   . Elevated LFTs   . ETOH abuse   . Tobacco abuse   . Osteoarthritis   . Breast cancer     Consultants: Orthopedics, ENT, infectious diseases  Procedures: Closed reduction of the right hip dislocation 8/12  Antibiotics: Vancomycin, aztreonam, ciprofloxacin 8/11--8/13 Vancomycin and Meropenem 8/14 Off abx since 8/16  Subjective: Denies pain, wanting to eat a regular meal, denies dysphagia, awaiting for transfer to [place]  Objective: Vital Signs  Filed Vitals:   05/01/15 0523 05/01/15 1407 05/01/15 2231 05/02/15 0653  BP: 151/87 140/77 144/75 136/70  Pulse: 70 77 76 65  Temp: 97.7 F (36.5 C) 97.7 F (36.5 C) 98.3 F (36.8 C) 97.5 F (36.4 C)  TempSrc: Oral Oral Oral Oral  Resp: 16 18 16 16   Height:      Weight:      SpO2: 96% 97% 97% 98%    Intake/Output Summary (Last 24 hours) at 05/02/15 1400 Last data filed at 05/02/15 0951  Gross per 24  hour  Intake 1683.75 ml  Output   1500 ml  Net 183.75 ml   Filed Weights   04/26/15 1624 04/27/15 0000 04/30/15 0400  Weight: 60.328 kg (133 lb) 59.3 kg (130 lb 11.7 oz) 55.9 kg (123 lb 3.8 oz)    General appearance: Awake and alert. In no distress. Resp: Good air entry bilaterally. No definite crackles or wheezing. Cardio: regular rate and rhythm, S1, S2 normal, no murmur, click, rub or gallop GI: soft, non-tender; bowel sounds normal; no masses,  no organomegaly Neurologic: No focal deficits. Moving all her extremities   Lab Results:  Basic Metabolic Panel:  Recent Labs Lab 04/27/15 0320 04/28/15 0350 04/29/15 0335 04/29/15 1810 04/30/15 0405 05/01/15 0516 05/02/15 0541  NA 133* 134* 131* 132* 133* 133* 133*  K 2.9* 3.2* 2.8* 3.6 3.5 3.4* 4.0  CL 97* 100* 96* 97* 99* 99* 101  CO2 29 25 23 25 26 26 27   GLUCOSE 110* 109* 91 137* 130* 111* 106*  BUN 12 8 <5* <5* <5* 5* 7  CREATININE 0.55 0.39* 0.31* 0.33* 0.32* 0.33* 0.35*  CALCIUM 7.9* 8.1* 7.9* 7.9* 8.3* 8.2* 8.4*  MG 1.5* 1.7  --   --  1.6*  --  1.7  PHOS 2.3*  --   --   --   --   --   --    Liver Function Tests:  Recent Labs Lab 04/27/15 0320 04/28/15 0350  AST 16 14*  ALT  9* 10*  ALKPHOS 87 111  BILITOT 0.8 0.9  PROT 5.8* 5.8*  ALBUMIN 2.3* 2.1*   CBC:  Recent Labs Lab 04/26/15 1633 04/27/15 0320 04/28/15 0350 04/29/15 0335 05/01/15 0516 05/02/15 0541  WBC 12.8* 9.4 7.8 8.2 11.0* 9.9  NEUTROABS 11.0* 7.3  --   --   --   --   HGB 10.0* 8.9* 9.9* 10.2* 11.5* 11.0*  HCT 30.6* 27.5* 29.3* 30.2* 34.5* 33.7*  MCV 83.8 83.6 83.0 81.0 81.0 82.2  PLT 232 181 231 256 291 294    CBG:  Recent Labs Lab 05/01/15 1141 05/01/15 1633 05/01/15 2300 05/02/15 0743 05/02/15 1137  GLUCAP 104* 133* 127* 136* 110*    Recent Results (from the past 240 hour(s))  Blood Culture (routine x 2)     Status: None   Collection Time: 04/26/15  5:27 PM  Result Value Ref Range Status   Specimen Description BLOOD  LEFT FOREARM  Final   Special Requests BOTTLES DRAWN AEROBIC AND ANAEROBIC 5CC  Final   Culture   Final    NO GROWTH 5 DAYS Performed at [facility]    Report Status 05/01/2015 FINAL  Final  Blood Culture (routine x 2)     Status: None   Collection Time: 04/26/15  5:29 PM  Result Value Ref Range Status   Specimen Description BLOOD LEFT ANTECUBITAL  Final   Special Requests BOTTLES DRAWN AEROBIC AND ANAEROBIC 5CC  Final   Culture   Final    NO GROWTH 5 DAYS Performed at [facility]    Report Status 05/01/2015 FINAL  Final  Urine culture     Status: None   Collection Time: 04/26/15  5:48 PM  Result Value Ref Range Status   Specimen Description URINE, CLEAN CATCH  Final   Special Requests NONE  Final   Culture   Final    NO GROWTH 2 DAYS Performed at [facility]    Report Status 04/28/2015 FINAL  Final  MRSA PCR Screening     Status: None   Collection Time: 04/27/15 12:25 AM  Result Value Ref Range Status   MRSA by PCR NEGATIVE NEGATIVE Final    Comment:        The GeneXpert MRSA Assay (FDA approved for NASAL specimens only), is one component of a comprehensive MRSA colonization surveillance program. It is not intended to diagnose MRSA infection nor to guide or monitor treatment for MRSA infections.   C difficile quick scan w PCR reflex     Status: None   Collection Time: 04/28/15  3:10 AM  Result Value Ref Range Status   C Diff antigen NEGATIVE NEGATIVE Final   C Diff toxin NEGATIVE NEGATIVE Final   C Diff interpretation Negative for toxigenic C. difficile  Final      Studies/Results: No results found.  Medications:  Scheduled: . gabapentin  600 mg Oral TID  . insulin aspart  0-15 Units Subcutaneous TID WC  . lidocaine  15 mL Mouth/Throat Once  . losartan  25 mg Oral Daily  . morphine  30 mg Oral Q12H  . saccharomyces boulardii  250 mg Oral BID  . simvastatin  10 mg Oral QPM   Continuous:   WUJ:[id number] **OR** [DISCONTINUED]  acetaminophen, albuterol, ALPRAZolam, diclofenac, iohexol, ipratropium, metoCLOPramide **OR** metoCLOPramide (REGLAN) injection, ondansetron **OR** ondansetron (ZOFRAN) IV, ondansetron **OR** ondansetron (ZOFRAN) IV, oxyCODONE, tiZANidine  Assessment/Plan:  Principal Problem:   Osteomyelitis of cervical spine Active Problems:   Diabetes mellitus type  2, insulin dependent   COPD (chronic obstructive pulmonary disease)   Hyperlipidemia   Hypertension   Hypokalemia   Hyponatremia   Recurrent dislocation of hip joint prosthesis   Hip dislocation, right   Healthcare-associated pneumonia   Cholecystitis, acute   Mediastinal abscess   Abscess of neck   Esophageal perforation   Loss of weight   Dysphagia, pharyngoesophageal phase   Abscess   Discitis   HCAP (healthcare-associated pneumonia)    Cervical spine osteomyelitis with prevertebral soft tissue edema and gas bubbles  Orthopedics is following. Dr. Shon Baton performed her cervical surgery in the past. Initially he wanted to avoid an extensive surgical procedure if patient improves with conservative management. Patient seen by infectious disease. Antibiotics have been adjusted. Patient is hemodynamically stable at this time. Repeat MRI shows similar findings. It is unlikely that the infection will improve just with antibiotics. abx stopped on 8/16 by infectious disease in hoping to obtain higher biopsy yield The plan is for patient to transfer to [place] on 8/17 and surgical intervention on Thursday 8/18. Esophagogram does not show any obvious perforation.  Possible healthcare associated pneumonia Was on broad-spectrum antibiotics. Blood cultures negative so far. No fever, no cough, no leukocytosis, off abx since 8/16  Dislocation of the right hip prosthesis Seen by orthopedics. She had a closed reduction on the morning of 8/12. Management per orthopedics. Denies any pain. ID has recommended to consider MRI of the hip in case that is  the source of infection. They will address this after surgery. Her hip replacement was in 2013, per patient.  Abnormal appearing gallbladder on CT scan LFTs are unremarkable. Patient remains nontender in the right upper quadrant. Ultrasound is as above. Gallbladder distention and minimal ductal dilatation was appreciated. But no cholecystitis identified. In view of the fact that her LFTs are unremarkable and the patient is asymptomatic, do not anticipate further evaluation currently. This can be addressed in the future with outpatient general surgery follow-up.  Hyponatremia, hypokalemia, hypomagnesemia  or place, potassiumHolding her diuretics.  Normocytic anemia Continue to monitor hemoglobin. Some drop in the hemoglobin is likely due to dilution.  History of coronary artery disease Stable. Continue to monitor.  History of COPD Continue nebulizer treatments. Inhalers.  History of chronic low back pain Noted to be on long-acting narcotics at home. Since she was drowsy initially, her dose of narcotics was reduced.  Her pain has gotten worse in the last 24 hours. Adjust her pain medications. Watch for somnolence.  History of diabetes mellitus type 2 Sliding scale insulin coverage. Holding her oral agents.  Chronic history of swallowing difficulty: Patient passed swallow eval, wanting to eat regular meals, diet orderedx1, npo after midnight for anticipated surgery on 8/18. ENT following No evidence of esophageal leak or Perforation. On DG esophagus   DVT Prophylaxis: Lovenox    Code Status: Full code  Family Communication: Discussed with the patient.  Discussed with her husband over the phone by Dr. Rito Ehrlich. Disposition Plan:  to be transferred to [facility] on 8/17. Orthopedics plans surgery on 8/18.    LOS: 6 days   Roxana Lai  [facility] Pager [phone] 05/02/2015, 2:00 PM  If 7PM-7AM, please contact night-coverage at [email or web address], password [facility]

## 2015-05-02 NOTE — Progress Notes (Signed)
Occupational Therapy Treatment Patient Details Name: [name] MRN: [id number] DOB: April 11, 1946 Today's Date: 05/02/2015    History of present illness  [name] is a 69 y.o. 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 Shon Baton;  past medical history of CAD, status post MI, insulin requiring type 2 diabetes, COPD, tobacco abuse disorder, EtOH abuse, hypertension, hyperlipidemia, history of breast cancer    OT comments  Reviewed THPs and pt had already done all bathing and grooming but did agree to OOB to practice functional transfers. Pt doing well overal with ADL but does needs min verbal cues for THPs. Pt supposed to transfer to [facility] today. Will continue to follow for acute OT.   Follow Up Recommendations  Home health OT;Supervision/Assistance - 24 hour    Equipment Recommendations  None recommended by OT (pt states she does have her 3in1)    Recommendations for Other Services      Precautions / Restrictions Precautions Precautions: Back;Posterior Hip Precaution Comments: reviewed 3/3 precautions.  Required Braces or Orthoses: Knee Immobilizer - Right Knee Immobilizer - Right: On at all times Restrictions RLE Weight Bearing: Weight bearing as tolerated       Mobility Bed Mobility Overal bed mobility: Needs Assistance Bed Mobility: Supine to Sit     Supine to sit: Min guard     General bed mobility comments: cues for THPs.  Transfers Overall transfer level: Needs assistance Equipment used: Rolling walker (2 wheeled) Transfers: Sit to/from Stand Sit to Stand: Min guard         General transfer comment: cues for THPs and hand placement. cues to extend R LE out in front of her more.    Balance                                   ADL                           Toilet Transfer: Ambulation;RW;Min guard             General ADL Comments: pt stating she had already washed up and performed grooming but did agree to OOB as she states she was feeling stiff in the bed. Reviewed THPs as pt needing cues to extend R LE out in front more before standing. pt did well with walker sequence.       Vision                     Perception     Praxis      Cognition   Behavior During Therapy: WFL for tasks assessed/performed Overall Cognitive Status: Within Functional Limits for tasks assessed                       Extremity/Trunk Assessment               Exercises     Shoulder Instructions       General Comments      Pertinent Vitals/ Pain       Pain Assessment: 0-10 Pain Score: 5  Pain Location: R hip, calf. Pain Descriptors / Indicators: Sore Pain Intervention(s): Repositioned  Home Living  Prior Functioning/Environment              Frequency Min 2X/week     Progress Toward Goals  OT Goals(current goals can now be found in the care plan section)  Progress towards OT goals: Progressing toward goals     Plan Discharge plan remains appropriate    Co-evaluation                 End of Session Equipment Utilized During Treatment: Rolling walker;Right knee immobilizer   Activity Tolerance Patient tolerated treatment well   Patient Left in bed;with call bell/phone within reach   Nurse Communication          Time: 1040-1100 OT Time Calculation (min): 20 min  Charges: OT General Charges $OT Visit: 1 Procedure OT Treatments $Therapeutic Activity: 8-22 mins  Lennox Laity  [phone] 05/02/2015, 11:11 AM

## 2015-05-02 NOTE — Plan of Care (Signed)
Problem: Phase I Progression Outcomes Goal: OOB as tolerated unless otherwise ordered Outcome: Progressing Pt ambulated with PT in hallway with rolling walker assist

## 2015-05-02 NOTE — Progress Notes (Signed)
    Subjective: 6 Days Post-Op Procedure(s) (LRB): CLOSED REDUCTION HIP (Right) Patient reports pain as 2 on 0-10 scale.   Denies CP or SOB.  Voiding without difficulty. Positive flatus. Objective: Vital signs in last 24 hours: Temp:  [97.5 F (36.4 C)-98.3 F (36.8 C)] 97.5 F (36.4 C) (08/17 0653) Pulse Rate:  [65-77] 65 (08/17 0653) Resp:  [16-18] 16 (08/17 0653) BP: (136-144)/(70-77) 136/70 mmHg (08/17 0653) SpO2:  [97 %-98 %] 98 % (08/17 0653)  Intake/Output from previous day: 08/16 0701 - 08/17 0700 In: 1683.8 [P.O.:720; I.V.:963.8] Out: 3000 [Urine:3000] Intake/Output this shift: Total I/O In: 240 [P.O.:240] Out: 400 [Urine:400]  Labs:  Recent Labs  05/01/15 0516 05/02/15 0541  HGB 11.5* 11.0*    Recent Labs  05/01/15 0516 05/02/15 0541  WBC 11.0* 9.9  RBC 4.26 4.10  HCT 34.5* 33.7*  PLT 291 294    Recent Labs  05/01/15 0516 05/02/15 0541  NA 133* 133*  K 3.4* 4.0  CL 99* 101  CO2 26 27  BUN 5* 7  CREATININE 0.33* 0.35*  GLUCOSE 111* 106*  CALCIUM 8.2* 8.4*   No results for input(s): LABPT, INR in the last 72 hours.  Physical Exam: Neurologically intact Intact pulses distally No cellulitis present Compartment soft negative babinski/hoffman/clonus  Ambulating   Assessment/Plan: 6 Days Post-Op Procedure(s) (LRB): CLOSED REDUCTION HIP (Right) Plan on I&D and removal of hardware in the AM Lovenox held - continue teds/scd's ENT is aware  Transfer pending  Ally Knodel D for Dr. Melina Schools [facility] [phone] 05/02/2015, 12:57 PM

## 2015-05-02 NOTE — Progress Notes (Signed)
[place] for Infectious Disease    Subjective: Wants to go home  Antibiotics:  Anti-infectives    Start     Dose/Rate Route Frequency Ordered Stop   04/29/15 0200  vancomycin (VANCOCIN) IVPB 750 mg/150 ml premix  Status:  Discontinued     750 mg 150 mL/hr over 60 Minutes Intravenous Every 8 hours 04/28/15 1816 05/01/15 1217   04/28/15 1900  vancomycin (VANCOCIN) 500 mg in sodium chloride 0.9 % 100 mL IVPB     500 mg 100 mL/hr over 60 Minutes Intravenous  Once 04/28/15 1815 04/28/15 1940   04/28/15 1400  meropenem (MERREM) 1 g in sodium chloride 0.9 % 100 mL IVPB  Status:  Discontinued     1 g 200 mL/hr over 30 Minutes Intravenous Every 8 hours 04/28/15 1312 05/01/15 1217   04/27/15 1400  ciprofloxacin (CIPRO) IVPB 400 mg  Status:  Discontinued     400 mg 200 mL/hr over 60 Minutes Intravenous Every 12 hours 04/27/15 0528 04/28/15 1302   04/27/15 0600  vancomycin (VANCOCIN) 500 mg in sodium chloride 0.9 % 100 mL IVPB  Status:  Discontinued     500 mg 100 mL/hr over 60 Minutes Intravenous Every 12 hours 04/26/15 1827 04/26/15 2358   04/27/15 0600  aztreonam (AZACTAM) 1 g in dextrose 5 % 50 mL IVPB  Status:  Discontinued     1 g 100 mL/hr over 30 Minutes Intravenous 3 times per day 04/26/15 1827 04/26/15 2358   04/27/15 0600  vancomycin (VANCOCIN) 500 mg in sodium chloride 0.9 % 100 mL IVPB  Status:  Discontinued     500 mg 100 mL/hr over 60 Minutes Intravenous Every 12 hours 04/27/15 0026 04/28/15 1815   04/27/15 0200  aztreonam (AZACTAM) 2 g in dextrose 5 % 50 mL IVPB  Status:  Discontinued     2 g 100 mL/hr over 30 Minutes Intravenous Every 8 hours 04/26/15 2358 04/28/15 1302   04/27/15 0000  ciprofloxacin (CIPRO) IVPB 200 mg  Status:  Discontinued     200 mg 100 mL/hr over 60 Minutes Intravenous Every 12 hours 04/26/15 2358 04/27/15 0528   04/26/15 1715  aztreonam (AZACTAM) 2 g in dextrose 5 % 50 mL IVPB     2 g 100 mL/hr over 30 Minutes Intravenous   Once 04/26/15 1711 04/26/15 1838   04/26/15 1715  vancomycin (VANCOCIN) IVPB 1000 mg/200 mL premix     1,000 mg 200 mL/hr over 60 Minutes Intravenous  Once 04/26/15 1711 04/26/15 1838      Medications: Scheduled Meds: . gabapentin  600 mg Oral TID  . insulin aspart  0-15 Units Subcutaneous TID WC  . lidocaine  15 mL Mouth/Throat Once  . losartan  25 mg Oral Daily  . morphine  30 mg Oral Q12H  . saccharomyces boulardii  250 mg Oral BID  . simvastatin  10 mg Oral QPM   Continuous Infusions:   PRN Meds:.acetaminophen **OR** [DISCONTINUED] acetaminophen, albuterol, ALPRAZolam, diclofenac, iohexol, ipratropium, metoCLOPramide **OR** metoCLOPramide (REGLAN) injection, ondansetron **OR** ondansetron (ZOFRAN) IV, ondansetron **OR** ondansetron (ZOFRAN) IV, oxyCODONE, tiZANidine    Objective: Weight change:   Intake/Output Summary (Last 24 hours) at 05/02/15 1722 Last data filed at 05/02/15 1300  Gross per 24 hour  Intake 1683.75 ml  Output   2100 ml  Net -416.25 ml   Blood pressure 130/76, pulse 71, temperature 98.1 F (36.7 C), temperature source Oral, resp. rate 15, height  5\' 2"  (1.575 m), weight 123 lb 3.8 oz (55.9 kg), SpO2 95 %. Temp:  [97.5 F (36.4 C)-98.3 F (36.8 C)] 98.1 F (36.7 C) (08/17 1431) Pulse Rate:  [65-76] 71 (08/17 1431) Resp:  [15-16] 15 (08/17 1431) BP: (130-144)/(70-76) 130/76 mmHg (08/17 1431) SpO2:  [95 %-98 %] 95 % (08/17 1431)  Physical Exam: General: Alert and awake,not in any acute distress. HEENT: anicteric sclera, pupils reactive to light and accommodation, EOMI, surgical scar is clean CVregular rate, normal r, no murmur rubs or gallops Chest: clear to auscultation bilaterally, no wheezing, rales or rhonchi Abdomen: soft nontender, nondistended, normal bowel sounds, Extremities: no clubbing or edema noted bilaterally Skin: no rashes Neuro: nonfocal, strength and sensation intact   CBC: CBC Latest Ref Rng 05/02/2015 05/01/2015 04/29/2015    WBC 4.0 - 10.5 K/uL 9.9 11.0(H) 8.2  Hemoglobin 12.0 - 15.0 g/dL 11.0(L) 11.5(L) 10.2(L)  Hematocrit 36.0 - 46.0 % 33.7(L) 34.5(L) 30.2(L)  Platelets 150 - 400 K/uL 294 291 256      BMET  Recent Labs  05/01/15 0516 05/02/15 0541  NA 133* 133*  K 3.4* 4.0  CL 99* 101  CO2 26 27  GLUCOSE 111* 106*  BUN 5* 7  CREATININE 0.33* 0.35*  CALCIUM 8.2* 8.4*     Liver Panel  No results for input(s): PROT, ALBUMIN, AST, ALT, ALKPHOS, BILITOT, BILIDIR, IBILI in the last 72 hours.     Sedimentation Rate No results for input(s): ESRSEDRATE in the last 72 hours. C-Reactive Protein No results for input(s): CRP in the last 72 hours.  Micro Results: Recent Results (from the past 720 hour(s))  Blood Culture (routine x 2)     Status: None   Collection Time: 04/26/15  5:27 PM  Result Value Ref Range Status   Specimen Description BLOOD LEFT FOREARM  Final   Special Requests BOTTLES DRAWN AEROBIC AND ANAEROBIC 5CC  Final   Culture   Final    NO GROWTH 5 DAYS Performed at [facility]    Report Status 05/01/2015 FINAL  Final  Blood Culture (routine x 2)     Status: None   Collection Time: 04/26/15  5:29 PM  Result Value Ref Range Status   Specimen Description BLOOD LEFT ANTECUBITAL  Final   Special Requests BOTTLES DRAWN AEROBIC AND ANAEROBIC 5CC  Final   Culture   Final    NO GROWTH 5 DAYS Performed at [facility]    Report Status 05/01/2015 FINAL  Final  Urine culture     Status: None   Collection Time: 04/26/15  5:48 PM  Result Value Ref Range Status   Specimen Description URINE, CLEAN CATCH  Final   Special Requests NONE  Final   Culture   Final    NO GROWTH 2 DAYS Performed at [facility]    Report Status 04/28/2015 FINAL  Final  MRSA PCR Screening     Status: None   Collection Time: 04/27/15 12:25 AM  Result Value Ref Range Status   MRSA by PCR NEGATIVE NEGATIVE Final    Comment:        The GeneXpert MRSA Assay (FDA approved for NASAL  specimens only), is one component of a comprehensive MRSA colonization surveillance program. It is not intended to diagnose MRSA infection nor to guide or monitor treatment for MRSA infections.   C difficile quick scan w PCR reflex     Status: None   Collection Time: 04/28/15  3:10 AM  Result Value  Ref Range Status   C Diff antigen NEGATIVE NEGATIVE Final   C Diff toxin NEGATIVE NEGATIVE Final   C Diff interpretation Negative for toxigenic C. difficile  Final    Studies/Results: No results found.    Assessment/Plan:  Principal Problem:   Osteomyelitis of cervical spine Active Problems:   Diabetes mellitus type 2, insulin dependent   COPD (chronic obstructive pulmonary disease)   Hyperlipidemia   Hypertension   Hypokalemia   Hyponatremia   Recurrent dislocation of hip joint prosthesis   Hip dislocation, right   Healthcare-associated pneumonia   Cholecystitis, acute   Mediastinal abscess   Abscess of neck   Esophageal perforation   Loss of weight   Dysphagia, pharyngoesophageal phase   Abscess   Discitis   HCAP (healthcare-associated pneumonia)    [name] is a 69 y.o. 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   #1 Cervical discitis with Stenosis and epidural abscess:  MRI without changes  [facility] TAKING THE PATIENT TO THE OR ON Thursday AT [facility]  She is to have gastrografin study   I STOPPED HER EXTREMELY BROAD SPECTRUM ANTIBIOTICS FOR NOW TO GIVE [place] A CHANCE AT ISOLATING AN ORGANISM FROM DEEP CULTURES FROM HER ABSCESS AND TISSUES, DISK, HARDWARE ON Thursday IN 48 HOURS  We can resume them immediately postop  NOTE ANESTHESIA PLEASE DO NOT GIVE PRE-OPERATIVE ABX, GIVE ABX AFTER CULTURES ARE OBTAINED BY ORTHO AND ENT    #2 70 pound weight loss: potentially due to esophageal problems given the fact that she has had dysphagia since  November 2015.  #3 prosthetic hip with recurrent dislocations: Could this prosthetic hip be infected may want to consider imaging the hip with MRI versus CT scan to ensure that it is not also a nidus of infection or site that is become seeded through her blood.  Bigger issue obviously the major problem with her neck, will consider this after she gets through that surgery   Dr. Megan Salon is covering at [facility] so I will add her to his list and tell him she is coming once she is transferred.    LOS: 6 days   [name] 05/02/2015, 5:22 PM

## 2015-05-02 NOTE — Evaluation (Signed)
Clinical/Bedside Swallow Evaluation Patient Details  Name: [name] MRN: [id number] Date of Birth: 1946-07-11  Today's Date: 05/02/2015 Time: SLP Start Time (ACUTE ONLY): 1141 SLP Stop Time (ACUTE ONLY): 1220 SLP Time Calculation (min) (ACUTE ONLY): 39 min  Past Medical History:  Past Medical History  Diagnosis Date  . Myocardial infarction 2000  . Diabetes mellitus type 2, insulin dependent   . COPD (chronic obstructive pulmonary disease)   . Hyperlipidemia   . OA (osteoarthritis) of knee   . Hypertension   . Coronary artery disease   . Asthma   . Elevated LFTs   . ETOH abuse   . Tobacco abuse   . Osteoarthritis   . Breast cancer    Past Surgical History:  Past Surgical History  Procedure Laterality Date  . Breast surgery  1991    right mastectomy  . Vulvectomy  1981    partial  . Neck fusion  2011  . Pelvic laparoscopy  2002    RSO  . Cesarean section  '78, '80, '81    x 3  . Hysteroscopy      D & C  . Cardiac catheterization  04/05/2009    EF 60%  . Lobectomy    . Tonsillectomy and adenoidectomy    . Mastectomy    . Coronary angioplasty  08/1998    x2 OF A BIFURCATION OM-1, OM-2 LESION  . Coronary angioplasty with stent placement  01/1999    MID FIRST OBTUSE MARGINAL VESSEL  . Coronary angioplasty with stent placement  07/1999    STENTING AT THE CRUX OF THE RIGHT CORONARY ARTERY WITH A 3.8MM X TETRA STENT  . Cardiovascular stress test  01/31/2005    EF 58%  . Total hip arthroplasty  08/2010    bilat  . Hip closed reduction Right 04/26/2015    Procedure: CLOSED REDUCTION HIP;  Surgeon: Venita Lick, MD;  Location: [facility];  Service: Orthopedics;  Laterality: Right;   HPI:      Assessment / Plan / Recommendation Clinical Impression  Pt with functional swallow based on clinical swallow evaluation, negative CN exam.  No indication of pharyngeal resdiuals nor aspiration noted.  Pt reports dysphagia to large pills lodging in her pharynx at times that  she has to propel into mouth to reswallow.  ? If cervical hardware impacts solid clearance.  Advised pt to compensation strategies for pill dysphagia.   Recommend regular/thin diet as pt reports swallow ability to be at baseline. Pt manages well by using caution with bread, cutting some large pills in half and masticating well.    Provided pt with written and verbal compensation strategies to mitigate her dysphagia due to her COPD and ACDF.   SlP to sign off as all education completed and pt at this time managing her chronic dysphagia.  Thanks for this order.     Aspiration Risk  Mild    Diet Recommendation Age appropriate regular solids;Thin   Medication Administration:  (defer to pt) Compensations: Slow rate;Small sips/bites    Other  Recommendations Oral Care Recommendations: Oral care BID   Follow Up Recommendations       Frequency and Duration        Pertinent Vitals/Pain Afebrile, decreased      Swallow Study Prior Functional Status   see HHX    General Date of Onset: 05/02/15 Type of Study: Bedside swallow evaluation Diet Prior to this Study: Thin liquids (full liquids) Temperature Spikes Noted: No Respiratory Status: Room  air History of Recent Intubation: No Behavior/Cognition: Alert;Cooperative;Pleasant mood Oral Cavity - Dentition: Edentulous (has dentures) Self-Feeding Abilities: Able to feed self Patient Positioning: Upright in bed Baseline Vocal Quality: Normal Volitional Cough: Strong Volitional Swallow: Able to elicit    Oral/Motor/Sensory Function Overall Oral Motor/Sensory Function: Appears within functional limits for tasks assessed   Ice Chips Ice chips: Not tested   Thin Liquid Thin Liquid: Within functional limits Presentation: Straw    Nectar Thick Nectar Thick Liquid: Not tested   Honey Thick Honey Thick Liquid: Not tested   Puree Puree: Within functional limits Presentation: Self Fed;Spoon   Solid   GO    Solid: Within functional  limits Presentation: Self Fed;Spoon       Donavan Burnet, MS [facility] SLP [phone]

## 2015-05-03 ENCOUNTER — Encounter (HOSPITAL_COMMUNITY)
Admission: EM | Disposition: A | Discharge status: Home Health | Payer: Self-pay | Source: Home / Self Care | Attending: Internal Medicine

## 2015-05-03 ENCOUNTER — Inpatient Hospital Stay (HOSPITAL_COMMUNITY): Payer: Medicare Other

## 2015-05-03 ENCOUNTER — Encounter (HOSPITAL_COMMUNITY): Payer: Self-pay | Admitting: Anesthesiology

## 2015-05-03 ENCOUNTER — Inpatient Hospital Stay (HOSPITAL_COMMUNITY): Payer: Medicare Other | Admitting: Anesthesiology

## 2015-05-03 DIAGNOSIS — T84029D Dislocation of unspecified internal joint prosthesis, subsequent encounter: Secondary | ICD-10-CM

## 2015-05-03 DIAGNOSIS — M4622 Osteomyelitis of vertebra, cervical region: Secondary | ICD-10-CM

## 2015-05-03 DIAGNOSIS — E785 Hyperlipidemia, unspecified: Secondary | ICD-10-CM

## 2015-05-03 DIAGNOSIS — E871 Hypo-osmolality and hyponatremia: Secondary | ICD-10-CM

## 2015-05-03 DIAGNOSIS — Z9889 Other specified postprocedural states: Secondary | ICD-10-CM

## 2015-05-03 HISTORY — PX: RIGID ESOPHAGOSCOPY: SHX5226

## 2015-05-03 HISTORY — PX: HARDWARE REMOVAL: SHX979

## 2015-05-03 HISTORY — PX: INCISION AND DRAINAGE ABSCESS: SHX5864

## 2015-05-03 HISTORY — PX: DIRECT LARYNGOSCOPY: SHX5326

## 2015-05-03 LAB — GRAM STAIN

## 2015-05-03 LAB — GLUCOSE, CAPILLARY
GLUCOSE-CAPILLARY: 109 mg/dL — AB (ref 65–99)
Glucose-Capillary: 119 mg/dL — ABNORMAL HIGH (ref 65–99)
Glucose-Capillary: 99 mg/dL (ref 65–99)
Glucose-Capillary: 138 mg/dL — ABNORMAL HIGH (ref 65–99)
Glucose-Capillary: 111 mg/dL — ABNORMAL HIGH (ref 65–99)
Glucose-Capillary: 111 mg/dL — ABNORMAL HIGH (ref 65–99)

## 2015-05-03 LAB — SURGICAL PCR SCREEN
MRSA, PCR: NEGATIVE
Staphylococcus aureus: NEGATIVE

## 2015-05-03 SURGERY — LARYNGOSCOPY, DIRECT
Anesthesia: General

## 2015-05-03 SURGERY — INCISION AND DRAINAGE, ABSCESS
Anesthesia: General | Site: Neck

## 2015-05-03 MED ORDER — PHENOL 1.4 % MT LIQD
1.0000 | OROMUCOSAL | Status: DC | PRN
  Administered 2015-05-03: 1 via OROMUCOSAL
  Filled 2015-05-03: qty 177

## 2015-05-03 MED ORDER — LACTATED RINGERS IV SOLN
INTRAVENOUS | Status: DC
  Administered 2015-05-03 – 2015-05-04 (×4): via INTRAVENOUS

## 2015-05-03 MED ORDER — INSULIN ASPART 100 UNIT/ML ~~LOC~~ SOLN
0.0000 [IU] | SUBCUTANEOUS | Status: DC
  Administered 2015-05-04: 1 [IU] via SUBCUTANEOUS
  Administered 2015-05-05 – 2015-05-06 (×2): 2 [IU] via SUBCUTANEOUS

## 2015-05-03 MED ORDER — ONDANSETRON HCL 4 MG/2ML IJ SOLN
4.0000 mg | INTRAMUSCULAR | Status: DC | PRN
  Administered 2015-05-04: 4 mg via INTRAVENOUS

## 2015-05-03 MED ORDER — MORPHINE SULFATE (PF) 4 MG/ML IV SOLN
4.0000 mg | INTRAVENOUS | Status: DC | PRN
  Administered 2015-05-03 – 2015-05-05 (×10): 4 mg via INTRAVENOUS
  Filled 2015-05-03 (×10): qty 1

## 2015-05-03 MED ORDER — LACTATED RINGERS IV SOLN
INTRAVENOUS | Status: DC | PRN
  Administered 2015-05-03: 07:00:00 via INTRAVENOUS

## 2015-05-03 MED ORDER — LIDOCAINE HCL (CARDIAC) 20 MG/ML IV SOLN
INTRAVENOUS | Status: AC
  Filled 2015-05-03: qty 5

## 2015-05-03 MED ORDER — SODIUM CHLORIDE 0.9 % IJ SOLN
INTRAMUSCULAR | Status: AC
  Filled 2015-05-03: qty 10

## 2015-05-03 MED ORDER — ROCURONIUM BROMIDE 50 MG/5ML IV SOLN
INTRAVENOUS | Status: AC
  Filled 2015-05-03: qty 1

## 2015-05-03 MED ORDER — BUPIVACAINE-EPINEPHRINE 0.25% -1:200000 IJ SOLN
INTRAMUSCULAR | Status: DC | PRN
  Administered 2015-05-03: 7 mL

## 2015-05-03 MED ORDER — SUCCINYLCHOLINE CHLORIDE 20 MG/ML IJ SOLN
INTRAMUSCULAR | Status: AC
  Filled 2015-05-03: qty 1

## 2015-05-03 MED ORDER — ONDANSETRON HCL 4 MG/2ML IJ SOLN
INTRAMUSCULAR | Status: AC
  Filled 2015-05-03: qty 2

## 2015-05-03 MED ORDER — GLYCOPYRROLATE 0.2 MG/ML IJ SOLN
INTRAMUSCULAR | Status: AC
  Filled 2015-05-03: qty 2

## 2015-05-03 MED ORDER — FENTANYL CITRATE (PF) 100 MCG/2ML IJ SOLN
INTRAMUSCULAR | Status: DC | PRN
  Administered 2015-05-03: 100 ug via INTRAVENOUS
  Administered 2015-05-03 (×7): 50 ug via INTRAVENOUS

## 2015-05-03 MED ORDER — BUPIVACAINE-EPINEPHRINE (PF) 0.25% -1:200000 IJ SOLN
INTRAMUSCULAR | Status: AC
  Filled 2015-05-03: qty 30

## 2015-05-03 MED ORDER — PHENYLEPHRINE HCL 10 MG/ML IJ SOLN
10.0000 mg | INTRAVENOUS | Status: DC | PRN
  Administered 2015-05-03: 50 ug/min via INTRAVENOUS

## 2015-05-03 MED ORDER — FENTANYL CITRATE (PF) 250 MCG/5ML IJ SOLN
INTRAMUSCULAR | Status: AC
  Filled 2015-05-03: qty 5

## 2015-05-03 MED ORDER — OXYCODONE HCL 5 MG PO TABS: 10.0000 mg | ORAL_TABLET | ORAL | Status: DC | PRN

## 2015-05-03 MED ORDER — MIDAZOLAM HCL 2 MG/2ML IJ SOLN
INTRAMUSCULAR | Status: AC
  Filled 2015-05-03: qty 4

## 2015-05-03 MED ORDER — SODIUM CHLORIDE 0.9 % IJ SOLN: 3.0000 mL | INTRAMUSCULAR | Status: DC | PRN

## 2015-05-03 MED ORDER — PROPOFOL 10 MG/ML IV BOLUS
INTRAVENOUS | Status: DC | PRN
  Administered 2015-05-03: 170 mg via INTRAVENOUS

## 2015-05-03 MED ORDER — ENOXAPARIN SODIUM 40 MG/0.4ML ~~LOC~~ SOLN
40.0000 mg | SUBCUTANEOUS | Status: DC
  Administered 2015-05-05 – 2015-05-06 (×2): 40 mg via SUBCUTANEOUS
  Filled 2015-05-03 (×2): qty 0.4

## 2015-05-03 MED ORDER — EPHEDRINE SULFATE 50 MG/ML IJ SOLN
INTRAMUSCULAR | Status: DC | PRN
  Administered 2015-05-03 (×2): 5 mg via INTRAVENOUS

## 2015-05-03 MED ORDER — VANCOMYCIN HCL IN DEXTROSE 1-5 GM/200ML-% IV SOLN
1000.0000 mg | INTRAVENOUS | Status: AC
  Administered 2015-05-03: 1000 mg via INTRAVENOUS
  Filled 2015-05-03: qty 200

## 2015-05-03 MED ORDER — 0.9 % SODIUM CHLORIDE (POUR BTL) OPTIME
TOPICAL | Status: DC | PRN
  Administered 2015-05-03: 4000 mL

## 2015-05-03 MED ORDER — SODIUM CHLORIDE 0.9 % IJ SOLN
3.0000 mL | Freq: Two times a day (BID) | INTRAMUSCULAR | Status: DC
  Administered 2015-05-05: 3 mL via INTRAVENOUS

## 2015-05-03 MED ORDER — PROPOFOL 10 MG/ML IV BOLUS
INTRAVENOUS | Status: AC
  Filled 2015-05-03: qty 20

## 2015-05-03 MED ORDER — CHLORHEXIDINE GLUCONATE 0.12% ORAL RINSE (MEDLINE KIT): 5.0000 mL | Freq: Four times a day (QID) | OROMUCOSAL | Status: DC

## 2015-05-03 MED ORDER — MIDAZOLAM HCL 5 MG/5ML IJ SOLN
INTRAMUSCULAR | Status: DC | PRN
  Administered 2015-05-03 (×2): 1 mg via INTRAVENOUS

## 2015-05-03 MED ORDER — METHOCARBAMOL 500 MG PO TABS: 500.0000 mg | ORAL_TABLET | Freq: Four times a day (QID) | ORAL | Status: DC | PRN

## 2015-05-03 MED ORDER — MORPHINE SULFATE (PF) 2 MG/ML IV SOLN
1.0000 mg | INTRAVENOUS | Status: DC | PRN
  Administered 2015-05-03: 4 mg via INTRAVENOUS
  Filled 2015-05-03: qty 2

## 2015-05-03 MED ORDER — SODIUM CHLORIDE 0.9 % IV SOLN
250.0000 mL | INTRAVENOUS | Status: DC
  Administered 2015-05-03: 250 mL via INTRAVENOUS

## 2015-05-03 MED ORDER — EPHEDRINE SULFATE 50 MG/ML IJ SOLN
INTRAMUSCULAR | Status: AC
  Filled 2015-05-03: qty 1

## 2015-05-03 MED ORDER — ROCURONIUM BROMIDE 100 MG/10ML IV SOLN
INTRAVENOUS | Status: DC | PRN
  Administered 2015-05-03: 40 mg via INTRAVENOUS

## 2015-05-03 MED ORDER — OXYMETAZOLINE HCL 0.05 % NA SOLN
NASAL | Status: AC
  Filled 2015-05-03: qty 15

## 2015-05-03 MED ORDER — SODIUM CHLORIDE 0.9 % IV SOLN
1.0000 g | Freq: Three times a day (TID) | INTRAVENOUS | Status: DC
  Administered 2015-05-03 – 2015-05-06 (×10): 1 g via INTRAVENOUS
  Filled 2015-05-03 (×16): qty 1

## 2015-05-03 MED ORDER — THROMBIN 20000 UNITS EX SOLR
CUTANEOUS | Status: AC
  Filled 2015-05-03: qty 20000

## 2015-05-03 MED ORDER — LIDOCAINE HCL (CARDIAC) 20 MG/ML IV SOLN
INTRAVENOUS | Status: DC | PRN
  Administered 2015-05-03: 70 mg via INTRAVENOUS

## 2015-05-03 MED ORDER — VANCOMYCIN HCL IN DEXTROSE 750-5 MG/150ML-% IV SOLN
750.0000 mg | Freq: Three times a day (TID) | INTRAVENOUS | Status: DC
  Administered 2015-05-03 – 2015-05-06 (×9): 750 mg via INTRAVENOUS
  Filled 2015-05-03 (×10): qty 150

## 2015-05-03 MED ORDER — NEOSTIGMINE METHYLSULFATE 10 MG/10ML IV SOLN
INTRAVENOUS | Status: AC
  Filled 2015-05-03: qty 1

## 2015-05-03 MED ORDER — GLYCOPYRROLATE 0.2 MG/ML IJ SOLN
INTRAMUSCULAR | Status: DC | PRN
  Administered 2015-05-03: 0.4 mg via INTRAVENOUS

## 2015-05-03 MED ORDER — PHENYLEPHRINE 40 MCG/ML (10ML) SYRINGE FOR IV PUSH (FOR BLOOD PRESSURE SUPPORT)
PREFILLED_SYRINGE | INTRAVENOUS | Status: AC
  Filled 2015-05-03: qty 10

## 2015-05-03 MED ORDER — HYDROMORPHONE HCL 1 MG/ML IJ SOLN: 0.2500 mg | INTRAMUSCULAR | Status: DC | PRN

## 2015-05-03 MED ORDER — MENTHOL 3 MG MT LOZG
1.0000 | LOZENGE | OROMUCOSAL | Status: DC | PRN
Start: 2015-05-03 — End: 2015-05-06

## 2015-05-03 MED ORDER — DEXTROSE 5 % IV SOLN
500.0000 mg | Freq: Four times a day (QID) | INTRAVENOUS | Status: DC | PRN
  Administered 2015-05-05: 500 mg via INTRAVENOUS
  Filled 2015-05-03 (×3): qty 5

## 2015-05-03 SURGICAL SUPPLY — 112 items
ADH SKN CLS APL DERMABOND .7 (GAUZE/BANDAGES/DRESSINGS) ×2
BALLN PULM 15 16.5 18 X 75CM (BALLOONS)
BALLN PULM 15 16.5 18X75 (BALLOONS)
BALLOON PULM 15 16.5 18X75 (BALLOONS) IMPLANT
BLADE SURG 15 STRL LF DISP TIS (BLADE) IMPLANT
BLADE SURG 15 STRL SS (BLADE)
BUR EGG ELITE 4.0 (BURR) IMPLANT
BUR EGG ELITE 4.0MM (BURR)
BUR MATCHSTICK NEURO 3.0 LAGG (BURR) ×4 IMPLANT
CANISTER SUCTION 2500CC (MISCELLANEOUS) ×4 IMPLANT
CLOSURE STERI-STRIP 1/2X4 (GAUZE/BANDAGES/DRESSINGS) ×1
CLOSURE WOUND 1/2 X4 (GAUZE/BANDAGES/DRESSINGS) ×1
CLSR STERI-STRIP ANTIMIC 1/2X4 (GAUZE/BANDAGES/DRESSINGS) ×3 IMPLANT
CONT SPEC 4OZ CLIKSEAL STRL BL (MISCELLANEOUS) ×8 IMPLANT
CORDS BIPOLAR (ELECTRODE) ×4 IMPLANT
COVER MAYO STAND STRL (DRAPES) ×8 IMPLANT
COVER SURGICAL LIGHT HANDLE (MISCELLANEOUS) ×4 IMPLANT
COVER TABLE BACK 60X90 (DRAPES) ×4 IMPLANT
CRADLE DONUT ADULT HEAD (MISCELLANEOUS) IMPLANT
CUFF TOURNIQUET SINGLE 34IN LL (TOURNIQUET CUFF) IMPLANT
CUFF TOURNIQUET SINGLE 44IN (TOURNIQUET CUFF) IMPLANT
DERMABOND ADVANCED (GAUZE/BANDAGES/DRESSINGS) ×2
DERMABOND ADVANCED .7 DNX12 (GAUZE/BANDAGES/DRESSINGS) ×2 IMPLANT
DRAIN CHANNEL 15F RND FF W/TCR (WOUND CARE) ×4 IMPLANT
DRAPE POUCH INSTRU U-SHP 10X18 (DRAPES) ×4 IMPLANT
DRAPE PROXIMA HALF (DRAPES) ×8 IMPLANT
DRAPE SURG 17X23 STRL (DRAPES) ×12 IMPLANT
DRAPE TABLE COVER HEAVY DUTY (DRAPES) ×4 IMPLANT
DRAPE U-SHAPE 47X51 STRL (DRAPES) ×4 IMPLANT
DRSG ADAPTIC 3X8 NADH LF (GAUZE/BANDAGES/DRESSINGS) ×4 IMPLANT
DRSG MEPILEX BORDER 4X8 (GAUZE/BANDAGES/DRESSINGS) ×4 IMPLANT
DRSG OPSITE 6X11 MED (GAUZE/BANDAGES/DRESSINGS) ×4 IMPLANT
DURAPREP 26ML APPLICATOR (WOUND CARE) ×4 IMPLANT
ELECT BLADE 4.0 EZ CLEAN MEGAD (MISCELLANEOUS)
ELECT BLADE 6.5 EXT (BLADE) ×4 IMPLANT
ELECT CAUTERY BLADE 6.4 (BLADE) ×4 IMPLANT
ELECT PENCIL ROCKER SW 15FT (MISCELLANEOUS) ×4 IMPLANT
ELECT REM PT RETURN 9FT ADLT (ELECTROSURGICAL) ×4
ELECTRODE BLDE 4.0 EZ CLN MEGD (MISCELLANEOUS) IMPLANT
ELECTRODE REM PT RTRN 9FT ADLT (ELECTROSURGICAL) ×2 IMPLANT
EVACUATOR 1/8 PVC DRAIN (DRAIN) IMPLANT
EVACUATOR SILICONE 100CC (DRAIN) ×4 IMPLANT
GAUZE SPONGE 4X4 12PLY STRL (GAUZE/BANDAGES/DRESSINGS) ×8 IMPLANT
GLOVE BIOGEL PI IND STRL 8 (GLOVE) ×2 IMPLANT
GLOVE BIOGEL PI IND STRL 8.5 (GLOVE) ×2 IMPLANT
GLOVE BIOGEL PI INDICATOR 8 (GLOVE) ×2
GLOVE BIOGEL PI INDICATOR 8.5 (GLOVE) ×2
GLOVE ECLIPSE 8.0 STRL XLNG CF (GLOVE) ×4 IMPLANT
GLOVE ORTHO TXT STRL SZ7.5 (GLOVE) ×4 IMPLANT
GLOVE SS BIOGEL STRL SZ 8 (GLOVE) ×2 IMPLANT
GLOVE SS BIOGEL STRL SZ 8.5 (GLOVE) ×2 IMPLANT
GLOVE SUPERSENSE BIOGEL SZ 8 (GLOVE) ×2
GLOVE SUPERSENSE BIOGEL SZ 8.5 (GLOVE) ×2
GOWN BRE IMP SLV AUR LG STRL (GOWN DISPOSABLE) IMPLANT
GOWN STRL REUS W/ TWL LRG LVL3 (GOWN DISPOSABLE) ×2 IMPLANT
GOWN STRL REUS W/ TWL XL LVL3 (GOWN DISPOSABLE) ×4 IMPLANT
GOWN STRL REUS W/TWL 2XL LVL3 (GOWN DISPOSABLE) ×8 IMPLANT
GOWN STRL REUS W/TWL LRG LVL3 (GOWN DISPOSABLE) ×4
GOWN STRL REUS W/TWL XL LVL3 (GOWN DISPOSABLE) ×8
GUARD TEETH (MISCELLANEOUS) ×4 IMPLANT
KIT BASIN OR (CUSTOM PROCEDURE TRAY) ×8 IMPLANT
KIT POSITION SURG JACKSON T1 (MISCELLANEOUS) ×4 IMPLANT
KIT ROOM TURNOVER OR (KITS) ×8 IMPLANT
MARKER SKIN DUAL TIP RULER LAB (MISCELLANEOUS) ×4 IMPLANT
NDL HYPO 25GX1X1/2 BEV (NEEDLE) IMPLANT
NDL SPNL 18GX3.5 QUINCKE PK (NEEDLE) ×4 IMPLANT
NEEDLE 22X1 1/2 (OR ONLY) (NEEDLE) ×4 IMPLANT
NEEDLE HYPO 25GX1X1/2 BEV (NEEDLE) IMPLANT
NEEDLE SPNL 18GX3.5 QUINCKE PK (NEEDLE) ×8 IMPLANT
NS IRRIG 1000ML POUR BTL (IV SOLUTION) ×8 IMPLANT
PACK LAMINECTOMY ORTHO (CUSTOM PROCEDURE TRAY) ×4 IMPLANT
PACK UNIVERSAL I (CUSTOM PROCEDURE TRAY) ×4 IMPLANT
PAD ARMBOARD 7.5X6 YLW CONV (MISCELLANEOUS) ×16 IMPLANT
PATTIES SURGICAL .5 X.5 (GAUZE/BANDAGES/DRESSINGS) IMPLANT
PATTIES SURGICAL .5 X1 (DISPOSABLE) ×4 IMPLANT
PATTIES SURGICAL .5 X3 (DISPOSABLE) IMPLANT
PATTIES SURGICAL 1X1 (DISPOSABLE) IMPLANT
SOLUTION ANTI FOG 6CC (MISCELLANEOUS) IMPLANT
SPECIMEN JAR SMALL (MISCELLANEOUS) ×4 IMPLANT
SPONGE GAUZE 4X4 12PLY STER LF (GAUZE/BANDAGES/DRESSINGS) ×2 IMPLANT
SPONGE INTESTINAL PEANUT (DISPOSABLE) IMPLANT
SPONGE LAP 4X18 X RAY DECT (DISPOSABLE) ×8 IMPLANT
SPONGE SURGIFOAM ABS GEL 100 (HEMOSTASIS) ×4 IMPLANT
STRIP CLOSURE SKIN 1/2X4 (GAUZE/BANDAGES/DRESSINGS) ×3 IMPLANT
SURGIFLO W/THROMBIN 8M KIT (HEMOSTASIS) IMPLANT
SURGILUBE 2OZ TUBE FLIPTOP (MISCELLANEOUS) ×4 IMPLANT
SUT BONE WAX W31G (SUTURE) ×4 IMPLANT
SUT ETHILON 3 0 FSL (SUTURE) IMPLANT
SUT MON AB 3-0 SH 27 (SUTURE) ×4
SUT MON AB 3-0 SH27 (SUTURE) ×2 IMPLANT
SUT PDS AB 1 CTX 36 (SUTURE) ×4 IMPLANT
SUT VIC AB 0 CT1 27 (SUTURE) ×4
SUT VIC AB 0 CT1 27XBRD ANBCTR (SUTURE) ×2 IMPLANT
SUT VIC AB 0 CTB1 27 (SUTURE) ×8 IMPLANT
SUT VIC AB 1 CT1 27 (SUTURE) ×4
SUT VIC AB 1 CT1 27XBRD ANBCTR (SUTURE) ×2 IMPLANT
SUT VIC AB 1 CTX 36 (SUTURE) ×8
SUT VIC AB 1 CTX36XBRD ANBCTR (SUTURE) ×4 IMPLANT
SUT VIC AB 2-0 CT1 18 (SUTURE) ×4 IMPLANT
SUT VIC AB 3-0 X1 27 (SUTURE) IMPLANT
SYR BULB IRRIGATION 50ML (SYRINGE) ×4 IMPLANT
SYR CONTROL 10ML LL (SYRINGE) ×4 IMPLANT
SYR INFLATE BILIARY GAUGE (MISCELLANEOUS) IMPLANT
TAPE CLOTH SURG 4X10 WHT LF (GAUZE/BANDAGES/DRESSINGS) ×2 IMPLANT
TOWEL OR 17X24 6PK STRL BLUE (TOWEL DISPOSABLE) ×12 IMPLANT
TOWEL OR 17X26 10 PK STRL BLUE (TOWEL DISPOSABLE) ×4 IMPLANT
TRAP SPECIMEN MUCOUS 40CC (MISCELLANEOUS) IMPLANT
TRAY FOLEY CATH 16FRSI W/METER (SET/KITS/TRAYS/PACK) ×4 IMPLANT
TUBE CONNECTING 12'X1/4 (SUCTIONS) ×1
TUBE CONNECTING 12X1/4 (SUCTIONS) ×3 IMPLANT
WATER STERILE IRR 1000ML POUR (IV SOLUTION) ×8 IMPLANT
YANKAUER SUCT BULB TIP NO VENT (SUCTIONS) ×4 IMPLANT

## 2015-05-03 NOTE — Anesthesia Preprocedure Evaluation (Signed)
Anesthesia Evaluation  Patient identified by MRN, date of birth, ID band Patient awake    Reviewed: Allergy & Precautions, NPO status , Patient's Chart, lab work & pertinent test results  Airway Mallampati: II  TM Distance: >3 FB Neck ROM: Full    Dental   Pulmonary asthma , pneumonia -, Current Smoker,  breath sounds clear to auscultation        Cardiovascular hypertension, + CAD and + Past MI Rhythm:Regular Rate:Normal     Neuro/Psych    GI/Hepatic negative GI ROS, Neg liver ROS,   Endo/Other  diabetes  Renal/GU Renal disease     Musculoskeletal   Abdominal   Peds  Hematology   Anesthesia Other Findings   Reproductive/Obstetrics                             Anesthesia Physical Anesthesia Plan  ASA: III  Anesthesia Plan: General   Post-op Pain Management:    Induction: Intravenous  Airway Management Planned: Oral ETT  Additional Equipment:   Intra-op Plan:   Post-operative Plan: Possible Post-op intubation/ventilation  Informed Consent: I have reviewed the patients History and Physical, chart, labs and discussed the procedure including the risks, benefits and alternatives for the proposed anesthesia with the patient or authorized representative who has indicated his/her understanding and acceptance.   Dental advisory given  Plan Discussed with: CRNA, Anesthesiologist and Surgeon  Anesthesia Plan Comments:         Anesthesia Quick Evaluation

## 2015-05-03 NOTE — Op Note (Signed)
05/03/2015  8:09 AM    [name]  [id number]   Pre-Op Dx:  Cervical osteomyelitis  Post-op Dx: same plus esophageal fistula  Proc: Direct Laryngoscopy , Esophagoscopy  Surg:  Jodi Marble T MD  Anes:  GOT  EBL:  none  Comp:  none  Findings:   Prominent retropharyngeal tissues.  Nl larynx.  Tight cricopharyngeus.  Posterior wall perforation at C5-6 level with palpable fusion plate.  Probably 5 mm maximal diameter.  Some chronic granulation tissue.  No frank pus identified.  Nl cervical esophagus below that level  Procedure: With the patient in a comfortable supine position, general orotracheal anesthesia was induced without difficulty. At an appropriate level, the table was turned 90 patient placed in slight reverse Trendelenburg. A clean preparation and draping was accomplished in the standard fashion.  A rubber tooth guard was used to protect the upper alveolus. Taking care to protect the gums and the endotracheal tube, the direct laryngoscope was introduced and examination of the pharynx and larynx was performed with the findings as described above. The laryngoscope was removed.  The cervical esophagoscope was lubricated and passed the level of cricopharyngeus and with gentle pressure, passed through the cricopharyngeus and down the esophagus. Apart from narrowing at the cricopharyngeus level, no immediately evident lesions. Upon withdrawing the scope slowly, some apparent granulation tissue on the posterior pharyngeal wall just beneath the cricopharyngeus was noted.  With further exploration, including mechanical probing, the fusion plate was palpated. The exact size of the defect was difficult to determine.  Dispo:   Per Dr. Rolena Infante after neck exploration and removal of fusion plate.  Plan: Liberal suction drainage.   Npo x 3-4 weeks.  A Gastrostomy tube is probably her best alimentation route.  Continue antibiotics. Would consider repeat gastrografin swallow in 4 weeks  depending on how she is doing.    Tyson Alias MD

## 2015-05-03 NOTE — Progress Notes (Addendum)
ANTIBIOTIC CONSULT NOTE - INITIAL  Pharmacy Consult for Vancomycin Indication: cervical abscess  Allergies  Allergen Reactions  . Zithromax [Azithromycin Dihydrate]     ORAL ULCERS   . Penicillins Rash    Patient Measurements: Height: 5\' 2"  (157.5 cm) Weight: 123 lb 3.8 oz (55.9 kg) IBW/kg (Calculated) : 50.1  Vital Signs: Temp: 97.9 F (36.6 C) (08/18 1100) BP: 108/56 mmHg (08/18 1100) Pulse Rate: 77 (08/18 1100) Intake/Output from previous day: 08/17 0701 - 08/18 0700 In: 480 [P.O.:480] Out: 2900 [Urine:2900] Intake/Output from this shift: Total I/O In: 1120 [I.V.:1120] Out: 550 [Urine:550]  Labs:  Recent Labs  05/01/15 0516 05/02/15 0541  WBC 11.0* 9.9  HGB 11.5* 11.0*  PLT 291 294  CREATININE 0.33* 0.35*   Estimated Creatinine Clearance: 54 mL/min (by C-G formula based on Cr of 0.35).  Recent Labs  04/30/15 1715  [place] 18     Microbiology: Recent Results (from the past 720 hour(s))  Blood Culture (routine x 2)     Status: None   Collection Time: 04/26/15  5:27 PM  Result Value Ref Range Status   Specimen Description BLOOD LEFT FOREARM  Final   Special Requests BOTTLES DRAWN AEROBIC AND ANAEROBIC 5CC  Final   Culture   Final    NO GROWTH 5 DAYS Performed at [facility]    Report Status 05/01/2015 FINAL  Final  Blood Culture (routine x 2)     Status: None   Collection Time: 04/26/15  5:29 PM  Result Value Ref Range Status   Specimen Description BLOOD LEFT ANTECUBITAL  Final   Special Requests BOTTLES DRAWN AEROBIC AND ANAEROBIC 5CC  Final   Culture   Final    NO GROWTH 5 DAYS Performed at [facility]    Report Status 05/01/2015 FINAL  Final  Urine culture     Status: None   Collection Time: 04/26/15  5:48 PM  Result Value Ref Range Status   Specimen Description URINE, CLEAN CATCH  Final   Special Requests NONE  Final   Culture   Final    NO GROWTH 2 DAYS Performed at [facility]    Report Status  04/28/2015 FINAL  Final  MRSA PCR Screening     Status: None   Collection Time: 04/27/15 12:25 AM  Result Value Ref Range Status   MRSA by PCR NEGATIVE NEGATIVE Final    Comment:        The GeneXpert MRSA Assay (FDA approved for NASAL specimens only), is one component of a comprehensive MRSA colonization surveillance program. It is not intended to diagnose MRSA infection nor to guide or monitor treatment for MRSA infections.   C difficile quick scan w PCR reflex     Status: None   Collection Time: 04/28/15  3:10 AM  Result Value Ref Range Status   C Diff antigen NEGATIVE NEGATIVE Final   C Diff toxin NEGATIVE NEGATIVE Final   C Diff interpretation Negative for toxigenic C. difficile  Final  Surgical pcr screen     Status: None   Collection Time: 05/03/15  5:17 AM  Result Value Ref Range Status   MRSA, PCR NEGATIVE NEGATIVE Final   Staphylococcus aureus NEGATIVE NEGATIVE Final    Comment:        The Xpert SA Assay (FDA approved for NASAL specimens in patients over 46 years of age), is one component of a comprehensive surveillance program.  Test performance has been validated by [facility] for patients greater  than or equal to 86 year old. It is not intended to diagnose infection nor to guide or monitor treatment.   Gram stain     Status: None   Collection Time: 05/03/15  8:36 AM  Result Value Ref Range Status   Specimen Description ABSCESS  Final   Special Requests SPEC A RETAINED HARDWARE AND CERVICAL ABSCESS  Final   Gram Stain   Final    FEW WBC PRESENT,BOTH PMN AND MONONUCLEAR NO ORGANISMS SEEN    Report Status 05/03/2015 FINAL  Final  Gram stain     Status: None   Collection Time: 05/03/15  8:42 AM  Result Value Ref Range Status   Specimen Description ABSCESS  Final   Special Requests SPEC B NECK ABCESS C3  Final   Gram Stain   Final    FEW WBC PRESENT,BOTH PMN AND MONONUCLEAR NO ORGANISMS SEEN    Report Status 05/03/2015 FINAL  Final    Medical  History: Past Medical History  Diagnosis Date  . Myocardial infarction 2000  . Diabetes mellitus type 2, insulin dependent   . COPD (chronic obstructive pulmonary disease)   . Hyperlipidemia   . OA (osteoarthritis) of knee   . Hypertension   . Coronary artery disease   . Asthma   . Elevated LFTs   . ETOH abuse   . Tobacco abuse   . Osteoarthritis   . Breast cancer     Medications:  Scheduled:  . [START ON 05/04/2015] enoxaparin (LOVENOX) injection  40 mg Subcutaneous Q24H  . sodium chloride  3 mL Intravenous Q12H   Infusions:  . sodium chloride    . lactated ringers 85 mL/hr at 05/03/15 1106   Assessment: 69 yo F transferred from [facility] for OR with exploration of cervical abscess.  Pt was previously on Vancomycin and Merrem prior to transfer.  ID consulted on the patient and recommended discontinuing broad spectrum abx pre-op in hopes of obtaining cx in OR 8/18.  Per their note, abx to be restarted pot-op.  Vancomycin has been reordered.  Will follow-up with ID re: Merrem.  Previously, patient had a therapeutic Vancomycin level on 750 mg IV q8h.  Of note, Vancomycin 1gm IV was given in OR today at 0900.   Goal of Therapy:  Vancomycin trough level 15-20 mcg/ml  Plan:  Restart Vancomycin 750 mg IV q8h - next dose at 1700 Follow up renal function, cx data, and clinical progress. Follow up restart of [place], Pharm.D., BCPS Clinical Pharmacist Pager [phone] 05/03/2015 11:41 AM   Paged Dr. Megan Salon.  Will restart Merrem 1gm IV q8h as well.  [employer], Pharm.D., BCPS Clinical Pharmacist Pager [phone] 05/03/2015 12:41 PM

## 2015-05-03 NOTE — Anesthesia Postprocedure Evaluation (Signed)
  Anesthesia Post-op Note  Patient: [name]  Procedure(s) Performed: Procedure(s): INCISION AND DRAINAGE CERVICAL  ABSCESS AND REMOVAL OF HARDWARE (N/A) HARDWARE REMOVAL (N/A) RIGID ESOPHAGOSCOPY (N/A) DIRECT LARYNGOSCOPY (N/A)  Patient Location: PACU  Anesthesia Type:General  Level of Consciousness: awake  Airway and Oxygen Therapy: Patient Spontanous Breathing  Post-op Pain: mild  Post-op Assessment: Post-op Vital signs reviewed LLE Motor Response: Purposeful movement, Responds to commands LLE Sensation: Full sensation RLE Motor Response: Purposeful movement, Responds to commands RLE Sensation: Full sensation      Post-op Vital Signs: Reviewed  Last Vitals:  Filed Vitals:   05/03/15 1146  BP: 97/55  Pulse: 73  Temp: 36.7 C  Resp: 16    Complications: No apparent anesthesia complications

## 2015-05-03 NOTE — Progress Notes (Signed)
Orthopedic Tech Progress Note Patient Details:  [name] 07/25/1946 [id number]  Ortho Devices Type of Ortho Device: Soft collar Ortho Device/Splint Interventions: Application   [name], Rachel Parma 05/03/2015, 11:17 AM

## 2015-05-03 NOTE — Progress Notes (Signed)
    Subjective: Day of Surgery Procedure(s) (LRB): INCISION AND DRAINAGE CERVICAL  ABSCESS AND REMOVAL OF HARDWARE (N/A) HARDWARE REMOVAL (N/A) RIGID ESOPHAGOSCOPY (N/A) DIRECT LARYNGOSCOPY (N/A) Patient reports pain as 2 on 0-10 scale.   Denies CP or SOB.  Voiding without difficulty. Positive flatus. Objective: Vital signs in last 24 hours: Temp:  [98 F (36.7 C)-98.6 F (37 C)] 98 F (36.7 C) (08/18 0547) Pulse Rate:  [70-74] 74 (08/18 0547) Resp:  [15] 15 (08/18 0547) BP: (130-139)/(68-78) 131/68 mmHg (08/18 0547) SpO2:  [93 %-96 %] 93 % (08/18 0547)  Intake/Output from previous day: 08/17 0701 - 08/18 0700 In: 480 [P.O.:480] Out: 2900 [Urine:2900] Intake/Output this shift:    Labs:  Recent Labs  05/01/15 0516 05/02/15 0541  HGB 11.5* 11.0*    Recent Labs  05/01/15 0516 05/02/15 0541  WBC 11.0* 9.9  RBC 4.26 4.10  HCT 34.5* 33.7*  PLT 291 294    Recent Labs  05/01/15 0516 05/02/15 0541  NA 133* 133*  K 3.4* 4.0  CL 99* 101  CO2 26 27  BUN 5* 7  CREATININE 0.33* 0.35*  GLUCOSE 111* 106*  CALCIUM 8.2* 8.4*   No results for input(s): LABPT, INR in the last 72 hours.  Physical Exam: Neurologically intact Intact pulses distally Compartment soft  Assessment/Plan: Day of Surgery Procedure(s) (LRB): INCISION AND DRAINAGE CERVICAL  ABSCESS AND REMOVAL OF HARDWARE (N/A) HARDWARE REMOVAL (N/A) RIGID ESOPHAGOSCOPY (N/A) DIRECT LARYNGOSCOPY (N/A) Plan on hardware removal and drain placement Consent obtained  Trellis Vanoverbeke D for Dr. Melina Schools [facility] [phone] 05/03/2015, 7:22 AM

## 2015-05-03 NOTE — Anesthesia Procedure Notes (Signed)
Procedure Name: Intubation Date/Time: 05/03/2015 7:47 AM Performed by: Rebekah Chesterfield L Pre-anesthesia Checklist: Patient identified, Emergency Drugs available, Suction available, Patient being monitored and Timeout performed Patient Re-evaluated:Patient Re-evaluated prior to inductionOxygen Delivery Method: Circle system utilized Preoxygenation: Pre-oxygenation with 100% oxygen Intubation Type: IV induction Ventilation: Mask ventilation without difficulty Laryngoscope Size: Mac and 3 Grade View: Grade I Tube type: Oral Tube size: 7.0 mm Number of attempts: 1 Airway Equipment and Method: Stylet Placement Confirmation: ETT inserted through vocal cords under direct vision and breath sounds checked- equal and bilateral Secured at: 20 cm Tube secured with: Tape Dental Injury: Teeth and Oropharynx as per pre-operative assessment

## 2015-05-03 NOTE — Transfer of Care (Signed)
Immediate Anesthesia Transfer of Care Note  Patient: [name]  Procedure(s) Performed: Procedure(s): INCISION AND DRAINAGE CERVICAL  ABSCESS AND REMOVAL OF HARDWARE (N/A) HARDWARE REMOVAL (N/A) RIGID ESOPHAGOSCOPY (N/A) DIRECT LARYNGOSCOPY (N/A)  Patient Location: PACU  Anesthesia Type:General  Level of Consciousness: awake, alert , oriented and patient cooperative  Airway & Oxygen Therapy: Patient Spontanous Breathing and Patient connected to nasal cannula oxygen  Post-op Assessment: Report given to RN, Post -op Vital signs reviewed and stable and Patient moving all extremities  Post vital signs: Reviewed and stable  Last Vitals:  Filed Vitals:   05/03/15 1015  BP: 94/54  Pulse: 74  Temp:   Resp: 14    Complications: No apparent anesthesia complications

## 2015-05-03 NOTE — BH Specialist Note (Signed)
Report to Ginger RN She is aware pt NPO & Lovenox not to start till tomorrow .

## 2015-05-03 NOTE — Consult Note (Signed)
[facility] Surgery Consult Note  [name] [birth year]  [id number].    Requesting MD: Dr. Shon Baton Chief Complaint/Reason for Consult: Need for gastrostomy tube, esophageal fistula  HPI:  69 y/o very depressed smoking white female with PMH CAD, IDDM, COPD, breast CA who was admitted to [facility] after atraumatic right hip dislocations on 04/26/15, this has happened on multiple other occasions.  She was reduced in the OR on 04/27/15.  She apparently had an anterior cervical fusion in 2010 and has been having some dysphagia over the last year and thus a consult to Dr. Lazarus Salines.  A CT scan and subsequent MRI's of the neck was performed which showed abscess in the prevertebral space of her cervical spine and likely osteomyelitis.  Today she underwent hardware removal, drainage of cervical abscess, rigid esophagoscopy and direct laryngoscopy.  She was found to have an esophageal tear/fistula (5mm) intra-operatively today.  We were asked to see her about a gastrostomy tube placement for feeding as she needs to be NPO until the esophagus heals (estimated at least 1 month).  Dr. Lazarus Salines has recommended checking a swallow study in 1 month to check for continued leak.  She apparently has complained of fatigue, chills, anorexia, cough, dysphagia, and fevers over the last week.  She denies any h/o gastrointestinal problems.  She has had 3 vertical incision c-sections.  No other abdominal surgeries.     ROS: All systems reviewed and otherwise negative except for as above  Family History  Problem Relation Age of Onset  . Diabetes Mother   . Hypertension Father   . Heart disease Father   . Heart attack Father   . Stroke Father     Past Medical History  Diagnosis Date  . Myocardial infarction 2000  . Diabetes mellitus type 2, insulin dependent   . COPD (chronic obstructive pulmonary disease)   . Hyperlipidemia   . OA (osteoarthritis) of knee   . Hypertension   . Coronary artery disease   . Asthma   .  Elevated LFTs   . ETOH abuse   . Tobacco abuse   . Osteoarthritis   . Breast cancer     Past Surgical History  Procedure Laterality Date  . Breast surgery  1991    right mastectomy  . Vulvectomy  1981    partial  . Neck fusion  2011  . Pelvic laparoscopy  2002    RSO  . Cesarean section  '78, '80, '81    x 3  . Hysteroscopy      D & C  . Cardiac catheterization  04/05/2009    EF 60%  . Lobectomy    . Tonsillectomy and adenoidectomy    . Mastectomy    . Coronary angioplasty  08/1998    x2 OF A BIFURCATION OM-1, OM-2 LESION  . Coronary angioplasty with stent placement  01/1999    MID FIRST OBTUSE MARGINAL VESSEL  . Coronary angioplasty with stent placement  07/1999    STENTING AT THE CRUX OF THE RIGHT CORONARY ARTERY WITH A 3.8MM X TETRA STENT  . Cardiovascular stress test  01/31/2005    EF 58%  . Total hip arthroplasty  08/2010    bilat  . Hip closed reduction Right 04/26/2015    Procedure: CLOSED REDUCTION HIP;  Surgeon: Venita Lick, MD;  Location: [facility] ORS;  Service: Orthopedics;  Laterality: Right;    Social History:  reports that she has been smoking Cigarettes.  She has a 11.25 pack-year smoking  history. She has never used smokeless tobacco. She reports that she does not drink alcohol or use illicit drugs.  Allergies:  Allergies  Allergen Reactions  . Zithromax [Azithromycin Dihydrate]     ORAL ULCERS   . Penicillins Rash    Medications Prior to Admission  Medication Sig Dispense Refill  . albuterol (PROVENTIL HFA;VENTOLIN HFA) 108 (90 BASE) MCG/ACT inhaler Inhale 2-4 puffs into the lungs every 6 (six) hours as needed for wheezing (wheezing).     Marland Kitchen albuterol (PROVENTIL) (2.5 MG/3ML) 0.083% nebulizer solution Take 2.5 mg by nebulization every 6 (six) hours as needed.      . ALPRAZolam (XANAX) 0.25 MG tablet Take one tablet daily or as needed (Patient taking differently: Take 0.25 mg by mouth daily as needed for anxiety. Take one tablet daily or as needed) 90  tablet 3  . aspirin EC 81 MG tablet Take 1 tablet (81 mg total) by mouth daily. 30 tablet 0  . diclofenac (VOLTAREN) 75 MG EC tablet Take 75 mg by mouth 2 (two) times daily as needed for mild pain or moderate pain (pain).     . furosemide (LASIX) 40 MG tablet Take 1 tablet (40 mg total) by mouth 2 (two) times daily. (Patient taking differently: Take 40 mg by mouth daily. ) 180 tablet 3  . gabapentin (NEURONTIN) 600 MG tablet Take 600 mg by mouth 3 (three) times daily.     . Ipratropium Bromide HFA (ATROVENT HFA IN) Inhale 17 mcg into the lungs at bedtime.     Marland Kitchen JANUVIA 25 MG tablet Take 12.5 mg by mouth daily.     Marland Kitchen losartan (COZAAR) 25 MG tablet Take 25 mg by mouth daily.  1  . morphine (MS CONTIN) 30 MG 12 hr tablet Take 30 mg by mouth 4 (four) times daily.   0  . oxyCODONE (ROXICODONE) 15 MG immediate release tablet Take 15 mg by mouth every 6 (six) hours as needed for pain.     . simvastatin (ZOCOR) 10 MG tablet Take 10 mg by mouth every evening.    Marland Kitchen tiZANidine (ZANAFLEX) 4 MG tablet Take 4 mg by mouth every 8 (eight) hours as needed for muscle spasms.   0  . nitroGLYCERIN (NITROSTAT) 0.4 MG SL tablet Place 1 tablet (0.4 mg total) under the tongue as needed. (Patient not taking: Reported on 04/26/2015) 90 tablet 6    Blood pressure 97/55, pulse 73, temperature 98.1 F (36.7 C), temperature source Oral, resp. rate 16, height 5\' 2"  (1.575 m), weight 55.9 kg (123 lb 3.8 oz), SpO2 100 %. Physical Exam: General: WD, malnourished (123lbs) white female who is laying in bed in NAD, tearful, depressed HEENT: head is normocephalic, soft neck collar in place.  Sclera are noninjected.  PERRL.  Ears and nose without any masses or lesions.  Mouth is pink and moist. Heart: regular, rate, and rhythm.  No obvious murmurs, gallops, or rubs noted.  Palpable pedal pulses bilaterally Lungs: CTAB, no wheezes, rhonchi, or rales noted.  Respiratory effort nonlabored, good effort Abd: soft, NT/ND, +BS, no masses,  hernias, or organomegaly, low vertical midline incisions well healed, don't feel a hernia (3 c-sections) MS: all 4 extremities are symmetrical with no cyanosis, clubbing, or edema.  Right knee in immobilizer. Skin: warm and dry with no masses, lesions, or rashes Psych: A&Ox3, very depressed keeps making gestures of a gun to her head and saying "just shoot me" "I've had it up to here".   Results for orders placed  or performed during the hospital encounter of 04/26/15 (from the past 48 hour(s))  Glucose, capillary     Status: Abnormal   Collection Time: 05/01/15  4:33 PM  Result Value Ref Range   Glucose-Capillary 133 (H) 65 - 99 mg/dL  Glucose, capillary     Status: Abnormal   Collection Time: 05/01/15 11:00 PM  Result Value Ref Range   Glucose-Capillary 127 (H) 65 - 99 mg/dL  Basic metabolic panel     Status: Abnormal   Collection Time: 05/02/15  5:41 AM  Result Value Ref Range   Sodium 133 (L) 135 - 145 mmol/L   Potassium 4.0 3.5 - 5.1 mmol/L   Chloride 101 101 - 111 mmol/L   CO2 27 22 - 32 mmol/L   Glucose, Bld 106 (H) 65 - 99 mg/dL   BUN 7 6 - 20 mg/dL   Creatinine, Ser 3.87 (L) 0.44 - 1.00 mg/dL   Calcium 8.4 (L) 8.9 - 10.3 mg/dL   GFR calc non Af Amer >60 >60 mL/min   GFR calc Af Amer >60 >60 mL/min    Comment: (NOTE) The eGFR has been calculated using the CKD EPI equation. This calculation has not been validated in all clinical situations. eGFR's persistently <60 mL/min signify possible Chronic Kidney Disease.    Anion gap 5 5 - 15  CBC     Status: Abnormal   Collection Time: 05/02/15  5:41 AM  Result Value Ref Range   WBC 9.9 4.0 - 10.5 K/uL   RBC 4.10 3.87 - 5.11 MIL/uL   Hemoglobin 11.0 (L) 12.0 - 15.0 g/dL   HCT 56.4 (L) 33.2 - 95.1 %   MCV 82.2 78.0 - 100.0 fL   MCH 26.8 26.0 - 34.0 pg   MCHC 32.6 30.0 - 36.0 g/dL   RDW 88.4 16.6 - 06.3 %   Platelets 294 150 - 400 K/uL  Magnesium     Status: None   Collection Time: 05/02/15  5:41 AM  Result Value Ref Range    Magnesium 1.7 1.7 - 2.4 mg/dL  Glucose, capillary     Status: Abnormal   Collection Time: 05/02/15  7:43 AM  Result Value Ref Range   Glucose-Capillary 136 (H) 65 - 99 mg/dL  Glucose, capillary     Status: Abnormal   Collection Time: 05/02/15 11:37 AM  Result Value Ref Range   Glucose-Capillary 110 (H) 65 - 99 mg/dL  Glucose, capillary     Status: Abnormal   Collection Time: 05/02/15 10:49 PM  Result Value Ref Range   Glucose-Capillary 109 (H) 65 - 99 mg/dL   Comment 1 Notify RN   Surgical pcr screen     Status: None   Collection Time: 05/03/15  5:17 AM  Result Value Ref Range   MRSA, PCR NEGATIVE NEGATIVE   Staphylococcus aureus NEGATIVE NEGATIVE    Comment:        The Xpert SA Assay (FDA approved for NASAL specimens in patients over 2 years of age), is one component of a comprehensive surveillance program.  Test performance has been validated by [facility] for patients greater than or equal to 60 year old. It is not intended to diagnose infection nor to guide or monitor treatment.   Glucose, capillary     Status: None   Collection Time: 05/03/15  6:13 AM  Result Value Ref Range   Glucose-Capillary 99 65 - 99 mg/dL   Comment 1 Notify RN   Gram stain     Status:  None   Collection Time: 05/03/15  8:36 AM  Result Value Ref Range   Specimen Description ABSCESS    Special Requests SPEC A RETAINED HARDWARE AND CERVICAL ABSCESS    Gram Stain      FEW WBC PRESENT,BOTH PMN AND MONONUCLEAR NO ORGANISMS SEEN    Report Status 05/03/2015 FINAL   Gram stain     Status: None   Collection Time: 05/03/15  8:42 AM  Result Value Ref Range   Specimen Description ABSCESS    Special Requests SPEC B NECK ABCESS C3    Gram Stain      FEW WBC PRESENT,BOTH PMN AND MONONUCLEAR NO ORGANISMS SEEN    Report Status 05/03/2015 FINAL   Gram stain     Status: None   Collection Time: 05/03/15  9:04 AM  Result Value Ref Range   Specimen Description TISSUE    Special Requests PHLEGMA  FROM BEHIND EXPLATED PLATE    Gram Stain      FEW WBC PRESENT,BOTH PMN AND MONONUCLEAR NO ORGANISMS SEEN    Report Status 05/03/2015 FINAL   Glucose, capillary     Status: Abnormal   Collection Time: 05/03/15 11:33 AM  Result Value Ref Range   Glucose-Capillary 138 (H) 65 - 99 mg/dL   Comment 1 Repeat Test    Comment 2 Document in Chart    Dg Cervical Spine 1 View  05/03/2015   CLINICAL DATA:  Cervical abscess and hardware removal  EXAM: DG CERVICAL SPINE - 1 VIEW  COMPARISON:  MRI cervical spine dated 04/30/2015  FINDINGS: Single lateral view of the cervical spine.  Surgical clamp is partially obscured by the patient's shoulders and likely marks the upper thoracic spine, possibly the inferior margin of T1, although poorly visualized.  The C2 through C7 vertebral bodies are marked on the intraoperative image.  IMPRESSION: Intraoperative cervical localization as above.   Electronically Signed   By: Charline Bills M.D.   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 Corliss Skains the timing of surgery and whether he will take the approach of laparoscopic vs open.  Possibly tomorrow.  Already NPO, hold blood thinners after MN. -She is aggreable to surgery -Plan per Dr. Lazarus Salines is to be NPO for 1 month, then swallow eval to check for leak.  Will need tube feedings for at least 1 month.   -Patient want to know if procedure will be covered, have placed consult for case management.  Multiple medical problems - per Dr. Camie Patience, [facility] Surgery 05/03/2015, 1:47 PM Pager: [phone]

## 2015-05-03 NOTE — Brief Op Note (Signed)
04/26/2015 - 05/03/2015  9:51 AM  PATIENT:  [name]  69 y.o. female  PRE-OPERATIVE DIAGNOSIS:  RETAINED HARDWARD AND CERVICAL ABCESS  POST-OPERATIVE DIAGNOSIS:  RETAINED HARDWARD AND CERVICAL ABCESS  PROCEDURE:  Procedure(s): INCISION AND DRAINAGE CERVICAL  ABSCESS AND REMOVAL OF HARDWARE (N/A) HARDWARE REMOVAL (N/A) RIGID ESOPHAGOSCOPY (N/A) DIRECT LARYNGOSCOPY (N/A)  SURGEON:  Surgeon(s) and Role: Panel 1:    * Melina Schools, MD - Primary  Panel 2:    * Jodi Marble, MD - Primary  PHYSICIAN ASSISTANT:   ASSISTANTS: none   ANESTHESIA:   general  EBL:     BLOOD ADMINISTERED:none  DRAINS: 2 drains in the neck    LOCAL MEDICATIONS USED:  MARCAINE     SPECIMEN:  Source of Specimen:  neck  DISPOSITION OF SPECIMEN:  Microbiology  COUNTS:  YES  TOURNIQUET:  * No tourniquets in log *  DICTATION: .Other Dictation: Dictation Number [id number]  PLAN OF CARE: Admit to inpatient   PATIENT DISPOSITION:  PACU - hemodynamically stable.

## 2015-05-03 NOTE — Progress Notes (Signed)
Patient ID: [name], female   DOB: 26-Dec-1945, 69 y.o.   MRN: [id number]         [facility]    Date of Admission:  04/26/2015   Total days of antibiotics 8          Principal Problem:   Osteomyelitis of cervical spine Active Problems:   Mediastinal abscess   Abscess of neck   Recurrent dislocation of hip joint prosthesis   Dysphagia, pharyngoesophageal phase   Diabetes mellitus type 2, insulin dependent   COPD (chronic obstructive pulmonary disease)   Tobacco dependence   Hyperlipidemia   Hypertension   Hypokalemia   Hyponatremia   Hip dislocation, right   Healthcare-associated pneumonia   Cholecystitis, acute   Esophageal perforation   Loss of weight   Abscess   Discitis   HCAP (healthcare-associated pneumonia)   Cholecystitis   Esophageal fistula   . [START ON 05/04/2015] enoxaparin (LOVENOX) injection  40 mg Subcutaneous Q24H  . insulin aspart  0-9 Units Subcutaneous Q4H  . meropenem (MERREM) IV  1 g Intravenous Q8H  . sodium chloride  3 mL Intravenous Q12H  . vancomycin  750 mg Intravenous Q8H    Subjective: She is complaining of pain. She's had very poor appetite and has lost 70 pounds over the past year. He states he is feeling a little bit better since admission. The time of surgery this morning and an esophageal perforation was noted posteriorly over her cervical fixation plate. She then underwent incision and drainage of an abscess and removal of all hardware. No organisms were seen on the operative Gram stain.  Review of Systems: Pertinent items are noted in HPI.  Past Medical History  Diagnosis Date  . Myocardial infarction 2000  . Diabetes mellitus type 2, insulin dependent   . COPD (chronic obstructive pulmonary disease)   . Hyperlipidemia   . OA (osteoarthritis) of knee   . Hypertension   . Coronary artery disease   . Asthma   . Elevated LFTs   . ETOH abuse   . Tobacco abuse   . Osteoarthritis   . Breast  cancer     Social History  Substance Use Topics  . Smoking status: Current Every Day Smoker -- 0.75 packs/day for 15 years    Types: Cigarettes  . Smokeless tobacco: Never Used  . Alcohol Use: No    Family History  Problem Relation Age of Onset  . Diabetes Mother   . Hypertension Father   . Heart disease Father   . Heart attack Father   . Stroke Father    Allergies  Allergen Reactions  . Zithromax [Azithromycin Dihydrate]     ORAL ULCERS   . Penicillins Rash    OBJECTIVE: Filed Vitals:   05/03/15 1030 05/03/15 1045 05/03/15 1100 05/03/15 1146  BP: 91/57 101/56 108/56 97/55  Pulse: 74 70 77 73  Temp:   97.9 F (36.6 C) 98.1 F (36.7 C)  TempSrc:      Resp: 16 13 16 16   Height:      Weight:      SpO2: 98% 98% 99% 100%   Body mass index is 22.53 kg/(m^2).  General: she is thin and weak and somewhat frustrated Lungs: clear Cor: regular S1 and S2 with no murmurs Abdomen: soft and nontender   Lab Results Lab Results  Component Value Date   WBC 9.9 05/02/2015   HGB 11.0* 05/02/2015   HCT 33.7* 05/02/2015   MCV 82.2  05/02/2015   PLT 294 05/02/2015    Lab Results  Component Value Date   CREATININE 0.35* 05/02/2015   BUN 7 05/02/2015   NA 133* 05/02/2015   K 4.0 05/02/2015   CL 101 05/02/2015   CO2 27 05/02/2015    Lab Results  Component Value Date   ALT 10* 04/28/2015   AST 14* 04/28/2015   ALKPHOS 111 04/28/2015   BILITOT 0.9 04/28/2015     Microbiology: Recent Results (from the past 240 hour(s))  Blood Culture (routine x 2)     Status: None   Collection Time: 04/26/15  5:27 PM  Result Value Ref Range Status   Specimen Description BLOOD LEFT FOREARM  Final   Special Requests BOTTLES DRAWN AEROBIC AND ANAEROBIC 5CC  Final   Culture   Final    NO GROWTH 5 DAYS Performed at [facility]    Report Status 05/01/2015 FINAL  Final  Blood Culture (routine x 2)     Status: None   Collection Time: 04/26/15  5:29 PM  Result Value Ref Range  Status   Specimen Description BLOOD LEFT ANTECUBITAL  Final   Special Requests BOTTLES DRAWN AEROBIC AND ANAEROBIC 5CC  Final   Culture   Final    NO GROWTH 5 DAYS Performed at [facility]    Report Status 05/01/2015 FINAL  Final  Urine culture     Status: None   Collection Time: 04/26/15  5:48 PM  Result Value Ref Range Status   Specimen Description URINE, CLEAN CATCH  Final   Special Requests NONE  Final   Culture   Final    NO GROWTH 2 DAYS Performed at [facility]    Report Status 04/28/2015 FINAL  Final  MRSA PCR Screening     Status: None   Collection Time: 04/27/15 12:25 AM  Result Value Ref Range Status   MRSA by PCR NEGATIVE NEGATIVE Final    Comment:        The GeneXpert MRSA Assay (FDA approved for NASAL specimens only), is one component of a comprehensive MRSA colonization surveillance program. It is not intended to diagnose MRSA infection nor to guide or monitor treatment for MRSA infections.   C difficile quick scan w PCR reflex     Status: None   Collection Time: 04/28/15  3:10 AM  Result Value Ref Range Status   C Diff antigen NEGATIVE NEGATIVE Final   C Diff toxin NEGATIVE NEGATIVE Final   C Diff interpretation Negative for toxigenic C. difficile  Final  Surgical pcr screen     Status: None   Collection Time: 05/03/15  5:17 AM  Result Value Ref Range Status   MRSA, PCR NEGATIVE NEGATIVE Final   Staphylococcus aureus NEGATIVE NEGATIVE Final    Comment:        The Xpert SA Assay (FDA approved for NASAL specimens in patients over 2 years of age), is one component of a comprehensive surveillance program.  Test performance has been validated by [facility] for patients greater than or equal to 29 year old. It is not intended to diagnose infection nor to guide or monitor treatment.   Gram stain     Status: None   Collection Time: 05/03/15  8:36 AM  Result Value Ref Range Status   Specimen Description ABSCESS  Final   Special  Requests SPEC A RETAINED HARDWARE AND CERVICAL ABSCESS  Final   Gram Stain   Final    FEW WBC PRESENT,BOTH  PMN AND MONONUCLEAR NO ORGANISMS SEEN    Report Status 05/03/2015 FINAL  Final  Gram stain     Status: None   Collection Time: 05/03/15  8:42 AM  Result Value Ref Range Status   Specimen Description ABSCESS  Final   Special Requests SPEC B NECK ABCESS C3  Final   Gram Stain   Final    FEW WBC PRESENT,BOTH PMN AND MONONUCLEAR NO ORGANISMS SEEN    Report Status 05/03/2015 FINAL  Final  Gram stain     Status: None   Collection Time: 05/03/15  9:04 AM  Result Value Ref Range Status   Specimen Description TISSUE  Final   Special Requests PHLEGMA FROM BEHIND EXPLATED PLATE  Final   Gram Stain   Final    FEW WBC PRESENT,BOTH PMN AND MONONUCLEAR NO ORGANISMS SEEN    Report Status 05/03/2015 FINAL  Final   Assessment: She cervical spine infection and abscess related to her esophageal perforation. Her vancomycin and meropenem have been restarted postoperatively pending the results of operative cultures.  Plan: 1. Continue antibiotics pending operative culture results  Cliffton Asters, MD [facility] [phone] pager   [phone] cell 05/03/2015, 5:30 PM

## 2015-05-03 NOTE — Progress Notes (Signed)
PROGRESS NOTE  [name] OZH:[id number] DOB: May 29, 1946 DOA: 04/26/2015 PCP: Redmond Baseman, MD  Brief History 69 year old Caucasian female with a past medical history of coronary artery disease, insulin-dependent diabetes, COPD, history of breast cancer presented with right hip pain. She was noted to have a right hip prosthesis dislocation. She was also noted to have a fever. She was recently treated for pneumonia with Levaquin. CT scan of the chest revealed fluid collection in the lower neck and upper chest area. Patient was hospitalized for further management. She had a closed reduction of her hip dislocation. 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 There was concern for esophageal perforation. ENT was consulted. Orthopedics has been involved. The patient was transferred to [facility] for removal of hardware, irrigation and debridement and esophagoscopy. Assessment/Plan: Spondylodiskitis of cervical spine -04/30/15 MR of C-spine--diskitis and osteo of C3-4 and abnormal edema of retropharyngeal tissues with fluid collection anterior to C7 05/03/15--I&D with removal of hardware--Dr. Sheela Stack -abx stopped prior to surgery to maximize cultures-->restart vanc and merrem post op -abx per ID -anticipate 6-8 weeks IV abx -await final culture data Esophageal perforation -appreciate Dr. Lazarus Salines -8/816 esophagascopy--83mm posterior wall perforation -npo for 1 month -plans for PEG -general surgery consulted Dislocation of the right hip prosthesis -Seen by orthopedics.  -had a closed reduction on the morning of 8/12. Management per orthopedics.  -ID has recommended to consider MRI of the hip in case that is the source of infection. They will address this after surgery. Her hip replacement was in 2013, per patient.  Abnormal appearing gallbladder on CT  scan LFTs are unremarkable. Patient remains nontender in the right upper quadrant. Ultrasound is as above. Gallbladder distention and minimal ductal dilatation was appreciated. But no cholecystitis identified. In view of the fact that her LFTs are unremarkable and the patient is asymptomatic, do not anticipate further evaluation currently. This can be addressed in the future with outpatient general surgery follow-up.  Hyponatremia,  -chronic dating back to 08/2014--127-132 -monitor off diuretics Hypertension -Hold losartan secondary to soft blood pressure - Hold furosemide secondary soft blood pressure  hypokalemia, hypomagnesemia -repleted  Normocytic anemia Continue to monitor hemoglobin. Some drop in the hemoglobin is likely due to dilution. -baseline Hgb 10-11  History of coronary artery disease Stable. Continue to monitor.  History of COPD/tobacco abuse Continue nebulizer treatments. Inhalers. -Presently stable on 2 L nasal cannula -Pulmonary hygiene -Tobacco cessation discussed  History of chronic low back pain Noted to be on long-acting narcotics at home. Since she was drowsy initially, her dose of narcotics was reduced. Her pain has gotten worse in the last 24 hours. -Adjust her pain medications to IV until PEG is placed  History of diabetes mellitus type 2 -Sliding scale insulin coverage. Holding her oral agents.     Family Communication:   Pt at beside Disposition Plan:   Home when medically stable       Procedures/Studies: Dg Cervical Spine 1 View  05/03/2015   CLINICAL DATA:  Cervical abscess and hardware removal  EXAM: DG CERVICAL SPINE - 1 VIEW  COMPARISON:  MRI cervical spine dated 04/30/2015  FINDINGS: Single lateral view of the cervical spine.  Surgical clamp is partially obscured by the patient's shoulders and likely marks the upper thoracic spine, possibly the inferior margin of T1, although poorly visualized.  The  C2 through C7 vertebral bodies are  marked on the intraoperative image.  IMPRESSION: Intraoperative cervical localization as above.   Electronically Signed   By: Charline Bills M.D.   On: 05/03/2015 09:52   Ct Soft Tissue Neck W Contrast  04/27/2015   CLINICAL DATA:  Paravertebral abscess. Abnormal CT chest yesterday. Cervical fusion 2010  EXAM: CT NECK WITH CONTRAST  TECHNIQUE: Multidetector CT imaging of the neck was performed using the standard protocol following the bolus administration of intravenous contrast.  CONTRAST:  OMNIPAQUE IOHEXOL 300 MG/ML  SOLN  COMPARISON:  CT chest 04/26/2015  FINDINGS: Pharynx and larynx: Normal tongue and oropharynx. Normal larynx. Extensive prevertebral soft tissue swelling and gas bubbles. Prevertebral swelling and fluid begins at approximately C2 and extends to T1. Rim enhancing fluid collection to the left of T1 compatible with abscess as noted on chest CT.  Salivary glands: Negative  Thyroid: Negative  Lymph nodes: Negative for adenopathy.  Vascular: Carotid artery patent bilaterally with mild atherosclerotic disease. Jugular vein patent bilaterally without thrombus.  Limited intracranial: Negative  Visualized orbits: Not imaged  Mastoids and visualized paranasal sinuses: Clear  Skeleton: ACDF C4 through C6. Anterior plate in good position. Solid fusion with strut graft. No bony destruction at these levels. There is spondylosis at C4-5 causing spinal and foraminal stenosis. There is also foraminal encroachment bilaterally at C5-6 due to spurring.  4 mm retrolisthesis C3-4 with disc degeneration and spurring. Severe spinal stenosis. Endplates are irregular. Osteomyelitis and discitis not excluded at this level as a cause for infection. Cervical spine MRI would be helpful to evaluate for spinal infection and to exclude epidural abscess.  Upper chest: 6 mm right upper lobe nodule again noted and unchanged. Small left pleural effusion.  IMPRESSION: Extensive prevertebral edema and fluid mixed with gas  bubbles. This is anterior to anterior plate fusion of C4 through C7. This is most likely arising from the spine. The infection could be arising from an infected fusion plate or possibly from discitis and osteomyelitis at C3-4. There is endplate irregularity at C3-4 with prominent posterior osteophyte formation. There is severe spinal stenosis at C3-4 and moderate to severe spinal stenosis at C4-5 due to spurring. 15 mm abscess to the left of T1.  MRI cervical spine without and with contrast suggested to exclude osteomyelitis of the spine and rule out epidural abscess.  These results were called by telephone at the time of interpretation on 04/27/2015 at 9:18 am to Dr. Flo Shanks , who verbally acknowledged these results.   Electronically Signed   By: Marlan Palau M.D.   On: 04/27/2015 09:08   Ct Chest W Contrast  04/26/2015   CLINICAL DATA:  Fever and cough.  Evaluate for pneumonia.  EXAM: CT CHEST WITH CONTRAST  TECHNIQUE: Multidetector CT imaging of the chest was performed during intravenous contrast administration.  CONTRAST:  80mL OMNIPAQUE IOHEXOL 300 MG/ML  SOLN  COMPARISON:  Chest radiograph 04/04/2015.  Chest CT 08/29/2014  FINDINGS: There is abnormal soft tissue in the lower neck along the left side of the thyroid gland and surrounding the upper esophagus. There is a small focal low-density structure along the left anterior aspect of C7 and T1. This collection has a small focus of gas within it and measures 1.1 x 0.9 x 2.0 cm. This collection is compatible with a soft tissue abscess. There is no significant fat plane between the proximal esophagus and the soft tissue abscess. Soft tissues in the left subcarinal region and distal  esophageal region are indistinct. Difficult to exclude inflammatory changes around the distal esophageal region, particularly on sequence 2, image 31.  There are coronary artery calcifications. The gallbladder is distended and there is at least mild intrahepatic biliary  dilatation. Limited evaluation of the anterior right liver due to artifact and motion. There may be abnormal enhancement along the anterior right hepatic lobe. In addition, there are slightly prominent lymph nodes in the upper abdomen and the left periaortic region. Some of these nodes were present on the exam from 2012.  The trachea and mainstem bronchi are patent. There appears chronic or recurrent filling defects in the left lower lobe bronchus and left lower lobe bronchi. There is chronic volume loss in the left lower lobe. Chronic patchy parenchymal densities in left lower lobe. Linear densities in medial right lower lobe could represent scar or atelectasis. There is a chronic of sub solid nodular structure in the anterior right upper lobe on sequence 4, image 18 measuring 5 mm. This has not significantly changed since 2012.  There is an anterior surgical plate in the cervical spine extending down to C6. There is no evidence for bone destruction involving the lower cervical spine or upper thoracic spine adjacent to the area of inflammation and abscess collection.  IMPRESSION: There is abnormal soft tissue in the lower neck and upper posterior mediastinum which likely represents edema and an inflammatory process. There is a small air-fluid collection along the left side of the cervical-thoracic junction which is compatible with an abscess. There is no clear evidence for bone destruction. The source for this inflammatory process is unclear. Upper esophagus is poorly characterized.  There are endobronchial filling defects in the left lower lobe airways. Similar findings were present on prior examinations and this could represent an area of recurrent mucous plugging or aspiration. There is some chronic volume loss in the left lower lobe.  The soft tissue along the mid and distal esophagus is indistinct. Difficult to exclude an inflammatory process in this region.  Gallbladder distension with at least mild  intrahepatic biliary dilatation. Difficult to exclude abnormal enhancement along the anterior right hepatic lobe. This area could be further characterized with ultrasound.  Critical Value/emergent results were called by telephone at the time of interpretation on 04/26/2015 at 7:47 pm to Dr. Elwin Mocha , who verbally acknowledged these results.   Electronically Signed   By: Richarda Overlie M.D.   On: 04/26/2015 20:03   Mr Cervical Spine W Wo Contrast  04/30/2015   CLINICAL DATA:  Followup C3-4 infection  EXAM: MRI CERVICAL SPINE WITHOUT AND WITH CONTRAST  TECHNIQUE: Multiplanar and multiecho pulse sequences of the cervical spine, to include the craniocervical junction and cervicothoracic junction, were obtained according to standard protocol without and with intravenous contrast.  CONTRAST:  11mL MULTIHANCE GADOBENATE DIMEGLUMINE 529 MG/ML IV SOLN  COMPARISON:  MRI 3 days ago.  CT 3 days ago.  FINDINGS: No change by imaging at this point. The foramen magnum is widely patent. C1-2 and C2-3 are unremarkable.  C3-4: Findings consistent with discitis osteomyelitis at this level. Retrolisthesis of 2 mm. Endplate osteophytes. Narrowing of the canal with AP diameter of 5 mm. Effacement of the subarachnoid space surrounding the cord. Slight deformity of the cord. No cord edema. Some epidural enhancement at this level consistent with epidural inflammation but no evidence of compressive epidural abscess or fluid collection.  Previous fusion from C4 through C6 with strut graft. Sufficient patency of the canal through that region. No epidural  collection.  C6-7: Facet degeneration with 1 or 2 mm of anterolisthesis. No compressive canal stenosis. Mild foraminal narrowing bilaterally.  C7-T1: Abnormal edema and enhancement of the C7 vertebral body worrisome for osteomyelitis at this level as well. Abnormal fluid anterior and to the left of the C7 vertebral body consistent with adjacent soft tissue abscess.  IMPRESSION: No worrisome  intraspinal change since the study of 3 days ago. Continuing evidence of discitis osteomyelitis at C3-4. Spinal canal narrowing with an AP diameter of 5 mm. To some deformity of the cord without cord edema. Epidural enhancement without evidence of drainable epidural collection.  Abnormal edema and enhancement of the C7 vertebral body consistent with osteomyelitis of that level as well.  Abnormal edema in the retropharyngeal tissues including a fluid collection anterior and to the left of the C7 vertebral body as seen previously.   Electronically Signed   By: Paulina Fusi M.D.   On: 04/30/2015 10:56   Mr Cervical Spine W Wo Contrast  04/27/2015   CLINICAL DATA:  Neck abscess. Dysphagia. Rule out discitis and osteomyelitis. Cervical fusion 2010.  EXAM: MRI CERVICAL SPINE WITHOUT AND WITH CONTRAST  TECHNIQUE: Multiplanar and multiecho pulse sequences of the cervical spine, to include the craniocervical junction and cervicothoracic junction, were obtained according to standard protocol without and with intravenous contrast.  CONTRAST:  12mL MULTIHANCE GADOBENATE DIMEGLUMINE 529 MG/ML IV SOLN  COMPARISON:  CT neck 04/27/2015  FINDINGS: The patient was not able to hold still. Image quality degraded by moderate motion.  ACDF with anterior plate C4 through C6. Fusion appears solid. No significant enhancement related to the fusion. Moderate to severe spinal stenosis at C4-5 due to spurring. Marked foraminal encroachment bilaterally at C4-5. Mild to moderate spinal and moderate foraminal stenosis bilaterally at C5-6.  There is abnormal bone marrow edema and enhancement throughout C3 and C4. The disc space is ill-defined and appears eroded. There is enhancement of the ventral epidural soft tissues which may represent infection/epidural abscess. There is posterior osteophyte formation and severe spinal stenosis with cord compression. Possible mild cord edema. There is also edema and enhancement involving the posterior  elements of C3 which may be related to infection as well.  Extensive prevertebral soft tissue swelling from C2 through T1. Gas bubbles are noted in the prevertebral tissues on CT. 15 mm epidural abscess is present to the left of T1, better seen on the prior CT.  IMPRESSION: Extensive prevertebral soft tissue edema with gas bubbles in the tissues consistent with infection. 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 Enhancement extends into the posterior elements of C3 which may also be infected. There is ventral epidural enhancement which may be due to abscess/ phlegmon. There is severe spinal stenosis and cord compression at C3-4. Cervical fusion at C4-5 and C5-6. Edema and enhancement extends into the C4 vertebral body which could be infected. Evidence of abnormal enhancement at C5 or C6. Moderate to severe spinal stenosis at C4-5 and mild to moderate spinal stenosis at C5-6.  15 mm epidural abscess to the left of T1, better seen on the prior CT.  These results were called by telephone at the time of interpretation on 04/27/2015 at 3:00 pm to Dr. Flo Shanks , who verbally acknowledged these results.   Electronically Signed   By: Marlan Palau M.D.   On: 04/27/2015 15:03   Dg Pelvis Portable  04/26/2015   CLINICAL DATA:  Right hip dislocation.  EXAM: PORTABLE PELVIS 1-2 VIEWS  COMPARISON:  03/26/2015  FINDINGS: The right hip prosthesis is dislocated with the femoral component displacing superior to the acetabular component. The prosthetic components remain well seated. There is no acute fracture.  Left hip prosthesis is well-seated and aligned.  Bones are demineralized.  IMPRESSION: Dislocated right hip prosthesis. No prosthetic loosening or evidence of a fracture.   Electronically Signed   By: Amie Portland M.D.   On: 04/26/2015 17:00   Dg Hip Port Unilat With Pelvis 1v Right  04/26/2015   CLINICAL DATA:  69 year old female with right hip arthroplasty complicated by recent right hip  dislocation status post right hip reduction.  EXAM: DG HIP (WITH OR WITHOUT PELVIS) 1V PORT RIGHT  COMPARISON:  04/26/2015.  FINDINGS: Postoperative changes of right total hip arthroplasty again noted. The prosthetic femoral head has been successfully relocated within the prosthetic acetabulum. No periprosthetic fractures are identified. Iodinated contrast material in the urinary bladder incidentally noted, presumably related to recent contrast enhanced chest CT.  IMPRESSION: 1. Successful relocation of right hip, without acute complicating features.   Electronically Signed   By: Trudie Reed M.D.   On: 04/26/2015 20:19   US Abdomen Limited Ruq  04/27/2015   CLINICAL DATA:  Cholecystitis, incidental finding of gallbladder distension on recent emergency room workup  EXAM: US ABDOMEN LIMITED - RIGHT UPPER QUADRANT  COMPARISON:  None.  FINDINGS: Gallbladder:  Distended gallbladder. Echogenic foci along the gallbladder wall without shadowing which may reflect gallbladder sludge. Unable to place the patient in decubitus position. No gallbladder wall thickening or pericholecystic fluid. No sonographic Murphy sign noted.  Common bile duct:  Diameter: 5.7 mm  Liver:  No focal hepatic mass. Mild intrahepatic biliary ductal dilatation. Small amount of perihepatic fluid. Otherwise normal hepatic echogenicity.  IMPRESSION: 1. Gallbladder distention and mild intrahepatic biliary ductal dilatation. No sonographic findings to suggest acute cholecystitis. 2. Small amount of echogenic material in the gallbladder dependently which may reflect gallbladder sludge. 3. Small amount of perihepatic fluid.   Electronically Signed   By: Elige Ko   On: 04/27/2015 09:35   Dg Esophagus W/water Sol Cm  04/30/2015   CLINICAL DATA:  Postop from cervical spine fusion. Severe neck pain and cervical spine osteomyelitis. Evaluate for esophageal perforation.  EXAM: ESOPHOGRAM/BARIUM SWALLOW  TECHNIQUE: Single contrast examination was  performed using 50 mL water-soluble Omnipaque 300.  FLUOROSCOPY TIME:  Fluoroscopy Time:  2 minutes 52 seconds  Number of Acquired Images:  7  COMPARISON:  None.  FINDINGS: Exam was technically difficult due to severe patient pain, limited mobility, and limited ability to cooperate with positioning.  Anterior cervical spine fixation hardware is noted. There is no evidence of esophageal contrast leak or perforation. No evidence of esophageal obstruction or dilatation.  IMPRESSION: Technically difficult exam. No evidence of esophageal leak or perforation.   Electronically Signed   By: Myles Rosenthal M.D.   On: 04/30/2015 12:34         Subjective: Patient complains of low back pain as well as neck pain. Denies any fevers, chills, chest pain, short of breath, nausea, vomiting, diarrhea, abdominal pain. No dysuria or hematuria. No rashes.  Objective: Filed Vitals:   05/03/15 1030 05/03/15 1045 05/03/15 1100 05/03/15 1146  BP: 91/57 101/56 108/56 97/55  Pulse: 74 70 77 73  Temp:   97.9 F (36.6 C) 98.1 F (36.7 C)  TempSrc:      Resp: 16 13 16 16   Height:      Weight:  SpO2: 98% 98% 99% 100%    Intake/Output Summary (Last 24 hours) at 05/03/15 1406 Last data filed at 05/03/15 1104  Gross per 24 hour  Intake   1120 ml  Output   2350 ml  Net  -1230 ml   Weight change:  Exam:   General:  Pt is alert, follows commands appropriately, not in acute distress  HEENT: No icterus, No thrush, No neck mass, [place]/AT  Cardiovascular: RRR, S1/S2, no rubs, no gallops  Respiratory: Bibasilar rales. No wheezing.  Abdomen: Soft/+BS, non tender, non distended, no guarding  Extremities: No edema, No lymphangitis, No petechiae, No rashes, no synovitis  Data Reviewed: Basic Metabolic Panel:  Recent Labs Lab 04/27/15 0320 04/28/15 0350 04/29/15 0335 04/29/15 1810 04/30/15 0405 05/01/15 0516 05/02/15 0541  NA 133* 134* 131* 132* 133* 133* 133*  K 2.9* 3.2* 2.8* 3.6 3.5 3.4* 4.0  CL 97*  100* 96* 97* 99* 99* 101  CO2 29 25 23 25 26 26 27   GLUCOSE 110* 109* 91 137* 130* 111* 106*  BUN 12 8 <5* <5* <5* 5* 7  CREATININE 0.55 0.39* 0.31* 0.33* 0.32* 0.33* 0.35*  CALCIUM 7.9* 8.1* 7.9* 7.9* 8.3* 8.2* 8.4*  MG 1.5* 1.7  --   --  1.6*  --  1.7  PHOS 2.3*  --   --   --   --   --   --    Liver Function Tests:  Recent Labs Lab 04/27/15 0320 04/28/15 0350  AST 16 14*  ALT 9* 10*  ALKPHOS 87 111  BILITOT 0.8 0.9  PROT 5.8* 5.8*  ALBUMIN 2.3* 2.1*   No results for input(s): LIPASE, AMYLASE in the last 168 hours. No results for input(s): AMMONIA in the last 168 hours. CBC:  Recent Labs Lab 04/26/15 1633 04/27/15 0320 04/28/15 0350 04/29/15 0335 05/01/15 0516 05/02/15 0541  WBC 12.8* 9.4 7.8 8.2 11.0* 9.9  NEUTROABS 11.0* 7.3  --   --   --   --   HGB 10.0* 8.9* 9.9* 10.2* 11.5* 11.0*  HCT 30.6* 27.5* 29.3* 30.2* 34.5* 33.7*  MCV 83.8 83.6 83.0 81.0 81.0 82.2  PLT 232 181 231 256 291 294   Cardiac Enzymes: No results for input(s): CKTOTAL, CKMB, CKMBINDEX, TROPONINI in the last 168 hours. BNP: Invalid input(s): POCBNP CBG:  Recent Labs Lab 05/02/15 0743 05/02/15 1137 05/02/15 2249 05/03/15 0613 05/03/15 1133  GLUCAP 136* 110* 109* 99 138*    Recent Results (from the past 240 hour(s))  Blood Culture (routine x 2)     Status: None   Collection Time: 04/26/15  5:27 PM  Result Value Ref Range Status   Specimen Description BLOOD LEFT FOREARM  Final   Special Requests BOTTLES DRAWN AEROBIC AND ANAEROBIC 5CC  Final   Culture   Final    NO GROWTH 5 DAYS Performed at [facility]    Report Status 05/01/2015 FINAL  Final  Blood Culture (routine x 2)     Status: None   Collection Time: 04/26/15  5:29 PM  Result Value Ref Range Status   Specimen Description BLOOD LEFT ANTECUBITAL  Final   Special Requests BOTTLES DRAWN AEROBIC AND ANAEROBIC 5CC  Final   Culture   Final    NO GROWTH 5 DAYS Performed at [facility]    Report Status  05/01/2015 FINAL  Final  Urine culture     Status: None   Collection Time: 04/26/15  5:48 PM  Result Value Ref Range Status  Specimen Description URINE, CLEAN CATCH  Final   Special Requests NONE  Final   Culture   Final    NO GROWTH 2 DAYS Performed at [facility]    Report Status 04/28/2015 FINAL  Final  MRSA PCR Screening     Status: None   Collection Time: 04/27/15 12:25 AM  Result Value Ref Range Status   MRSA by PCR NEGATIVE NEGATIVE Final    Comment:        The GeneXpert MRSA Assay (FDA approved for NASAL specimens only), is one component of a comprehensive MRSA colonization surveillance program. It is not intended to diagnose MRSA infection nor to guide or monitor treatment for MRSA infections.   C difficile quick scan w PCR reflex     Status: None   Collection Time: 04/28/15  3:10 AM  Result Value Ref Range Status   C Diff antigen NEGATIVE NEGATIVE Final   C Diff toxin NEGATIVE NEGATIVE Final   C Diff interpretation Negative for toxigenic C. difficile  Final  Surgical pcr screen     Status: None   Collection Time: 05/03/15  5:17 AM  Result Value Ref Range Status   MRSA, PCR NEGATIVE NEGATIVE Final   Staphylococcus aureus NEGATIVE NEGATIVE Final    Comment:        The Xpert SA Assay (FDA approved for NASAL specimens in patients over 80 years of age), is one component of a comprehensive surveillance program.  Test performance has been validated by [facility] for patients greater than or equal to 50 year old. It is not intended to diagnose infection nor to guide or monitor treatment.   Gram stain     Status: None   Collection Time: 05/03/15  8:36 AM  Result Value Ref Range Status   Specimen Description ABSCESS  Final   Special Requests SPEC A RETAINED HARDWARE AND CERVICAL ABSCESS  Final   Gram Stain   Final    FEW WBC PRESENT,BOTH PMN AND MONONUCLEAR NO ORGANISMS SEEN    Report Status 05/03/2015 FINAL  Final  Gram stain     Status: None    Collection Time: 05/03/15  8:42 AM  Result Value Ref Range Status   Specimen Description ABSCESS  Final   Special Requests SPEC B NECK ABCESS C3  Final   Gram Stain   Final    FEW WBC PRESENT,BOTH PMN AND MONONUCLEAR NO ORGANISMS SEEN    Report Status 05/03/2015 FINAL  Final  Gram stain     Status: None   Collection Time: 05/03/15  9:04 AM  Result Value Ref Range Status   Specimen Description TISSUE  Final   Special Requests PHLEGMA FROM BEHIND EXPLATED PLATE  Final   Gram Stain   Final    FEW WBC PRESENT,BOTH PMN AND MONONUCLEAR NO ORGANISMS SEEN    Report Status 05/03/2015 FINAL  Final     Scheduled Meds: . [START ON 05/04/2015] enoxaparin (LOVENOX) injection  40 mg Subcutaneous Q24H  . meropenem (MERREM) IV  1 g Intravenous Q8H  . sodium chloride  3 mL Intravenous Q12H  . vancomycin  750 mg Intravenous Q8H   Continuous Infusions: . sodium chloride 250 mL (05/03/15 1244)  . lactated ringers 85 mL/hr at 05/03/15 1106     Deshia Vanderhoof, DO  [facility] Pager [phone]  If 7PM-7AM, please contact night-coverage [email or web address] Password [email or web address] 05/03/2015, 2:06 PM   LOS: 7 days

## 2015-05-04 ENCOUNTER — Inpatient Hospital Stay (HOSPITAL_COMMUNITY): Payer: Medicare Other | Admitting: Anesthesiology

## 2015-05-04 ENCOUNTER — Encounter (HOSPITAL_COMMUNITY)
Admission: EM | Disposition: A | Discharge status: Home Health | Payer: Self-pay | Source: Home / Self Care | Attending: Internal Medicine

## 2015-05-04 ENCOUNTER — Encounter (HOSPITAL_COMMUNITY): Payer: Self-pay | Admitting: Orthopedic Surgery

## 2015-05-04 DIAGNOSIS — K223 Perforation of esophagus: Secondary | ICD-10-CM

## 2015-05-04 DIAGNOSIS — Z931 Gastrostomy status: Secondary | ICD-10-CM

## 2015-05-04 DIAGNOSIS — L0211 Cutaneous abscess of neck: Secondary | ICD-10-CM

## 2015-05-04 DIAGNOSIS — I10 Essential (primary) hypertension: Secondary | ICD-10-CM

## 2015-05-04 HISTORY — PX: GASTROSTOMY: SHX5249

## 2015-05-04 LAB — GLUCOSE, CAPILLARY
GLUCOSE-CAPILLARY: 102 mg/dL — AB (ref 65–99)
GLUCOSE-CAPILLARY: 122 mg/dL — AB (ref 65–99)
Glucose-Capillary: 144 mg/dL — ABNORMAL HIGH (ref 65–99)
Glucose-Capillary: 111 mg/dL — ABNORMAL HIGH (ref 65–99)
Glucose-Capillary: 103 mg/dL — ABNORMAL HIGH (ref 65–99)
Glucose-Capillary: 101 mg/dL — ABNORMAL HIGH (ref 65–99)
Glucose-Capillary: 118 mg/dL — ABNORMAL HIGH (ref 65–99)

## 2015-05-04 LAB — CBC
HEMATOCRIT: 32.9 % — AB (ref 36.0–46.0)
HEMOGLOBIN: 10.9 g/dL — AB (ref 12.0–15.0)
MCH: 27 pg (ref 26.0–34.0)
MCHC: 33.1 g/dL (ref 30.0–36.0)
MCV: 81.6 fL (ref 78.0–100.0)
Platelets: 313 10*3/uL (ref 150–400)
RBC: 4.03 MIL/uL (ref 3.87–5.11)
RDW: 15.3 % (ref 11.5–15.5)
WBC: 10.8 10*3/uL — AB (ref 4.0–10.5)

## 2015-05-04 LAB — BASIC METABOLIC PANEL
ANION GAP: 9 (ref 5–15)
BUN: 6 mg/dL (ref 6–20)
CO2: 23 mmol/L (ref 22–32)
Calcium: 8.3 mg/dL — ABNORMAL LOW (ref 8.9–10.3)
Chloride: 98 mmol/L — ABNORMAL LOW (ref 101–111)
Creatinine, Ser: 0.38 mg/dL — ABNORMAL LOW (ref 0.44–1.00)
GFR calc Af Amer: >60 mL/min (ref >60)
Glucose, Bld: 112 mg/dL — ABNORMAL HIGH (ref 65–99)
POTASSIUM: 3.7 mmol/L (ref 3.5–5.1)
SODIUM: 130 mmol/L — AB (ref 135–145)

## 2015-05-04 SURGERY — INSERTION OF GASTROSTOMY TUBE
Anesthesia: General | Site: Abdomen

## 2015-05-04 MED ORDER — HYDROMORPHONE HCL 1 MG/ML IJ SOLN
INTRAMUSCULAR | Status: AC
  Filled 2015-05-04: qty 1

## 2015-05-04 MED ORDER — BUPIVACAINE-EPINEPHRINE 0.25% -1:200000 IJ SOLN
INTRAMUSCULAR | Status: DC | PRN
  Administered 2015-05-04: 20 mL

## 2015-05-04 MED ORDER — SODIUM CHLORIDE 0.9 % IJ SOLN
INTRAMUSCULAR | Status: AC
  Filled 2015-05-04: qty 20

## 2015-05-04 MED ORDER — LIDOCAINE HCL (CARDIAC) 20 MG/ML IV SOLN
INTRAVENOUS | Status: AC
  Filled 2015-05-04: qty 5

## 2015-05-04 MED ORDER — HYDRALAZINE HCL 20 MG/ML IJ SOLN
10.0000 mg | Freq: Four times a day (QID) | INTRAMUSCULAR | Status: DC | PRN
  Administered 2015-05-04: 10 mg via INTRAVENOUS
  Filled 2015-05-04: qty 1

## 2015-05-04 MED ORDER — FENTANYL 25 MCG/HR TD PT72
25.0000 ug | MEDICATED_PATCH | TRANSDERMAL | Status: DC
  Administered 2015-05-04: 25 ug via TRANSDERMAL
  Filled 2015-05-04 (×2): qty 1

## 2015-05-04 MED ORDER — FENTANYL CITRATE (PF) 250 MCG/5ML IJ SOLN
INTRAMUSCULAR | Status: AC
  Filled 2015-05-04: qty 5

## 2015-05-04 MED ORDER — SODIUM CHLORIDE 0.9 % IJ SOLN: 10.0000 mL | INTRAMUSCULAR | Status: DC | PRN

## 2015-05-04 MED ORDER — MIDAZOLAM HCL 2 MG/2ML IJ SOLN
INTRAMUSCULAR | Status: AC
  Filled 2015-05-04: qty 4

## 2015-05-04 MED ORDER — FENTANYL CITRATE (PF) 100 MCG/2ML IJ SOLN
INTRAMUSCULAR | Status: DC | PRN
  Administered 2015-05-04: 100 ug via INTRAVENOUS
  Administered 2015-05-04: 150 ug via INTRAVENOUS
  Administered 2015-05-04: 50 ug via INTRAVENOUS
  Administered 2015-05-04 (×2): 100 ug via INTRAVENOUS

## 2015-05-04 MED ORDER — ROCURONIUM BROMIDE 100 MG/10ML IV SOLN
INTRAVENOUS | Status: DC | PRN
  Administered 2015-05-04: 20 mg via INTRAVENOUS

## 2015-05-04 MED ORDER — GLYCOPYRROLATE 0.2 MG/ML IJ SOLN
INTRAMUSCULAR | Status: DC | PRN
  Administered 2015-05-04: 0.4 mg via INTRAVENOUS

## 2015-05-04 MED ORDER — BUPIVACAINE-EPINEPHRINE (PF) 0.25% -1:200000 IJ SOLN
INTRAMUSCULAR | Status: AC
  Filled 2015-05-04: qty 30

## 2015-05-04 MED ORDER — PROPOFOL 10 MG/ML IV BOLUS
INTRAVENOUS | Status: AC
  Filled 2015-05-04: qty 20

## 2015-05-04 MED ORDER — NEOSTIGMINE METHYLSULFATE 10 MG/10ML IV SOLN
INTRAVENOUS | Status: DC | PRN
  Administered 2015-05-04: 3 mg via INTRAVENOUS

## 2015-05-04 MED ORDER — HYDROMORPHONE HCL 1 MG/ML IJ SOLN
0.2500 mg | INTRAMUSCULAR | Status: DC | PRN
  Administered 2015-05-04 (×6): 0.5 mg via INTRAVENOUS

## 2015-05-04 MED ORDER — LIDOCAINE HCL (CARDIAC) 20 MG/ML IV SOLN
INTRAVENOUS | Status: DC | PRN
  Administered 2015-05-04: 60 mg via INTRAVENOUS

## 2015-05-04 MED ORDER — ONDANSETRON HCL 4 MG/2ML IJ SOLN
INTRAMUSCULAR | Status: AC
  Filled 2015-05-04: qty 2

## 2015-05-04 MED ORDER — PROPOFOL 10 MG/ML IV BOLUS
INTRAVENOUS | Status: DC | PRN
  Administered 2015-05-04: 150 mg via INTRAVENOUS

## 2015-05-04 MED ORDER — 0.9 % SODIUM CHLORIDE (POUR BTL) OPTIME
TOPICAL | Status: DC | PRN
  Administered 2015-05-04: 1000 mL

## 2015-05-04 MED ORDER — PROMETHAZINE HCL 25 MG/ML IJ SOLN
6.2500 mg | INTRAMUSCULAR | Status: DC | PRN
Start: 2015-05-04 — End: 2015-05-04

## 2015-05-04 MED ORDER — MIDAZOLAM HCL 5 MG/5ML IJ SOLN
INTRAMUSCULAR | Status: DC | PRN
  Administered 2015-05-04: 2 mg via INTRAVENOUS

## 2015-05-04 MED ORDER — SODIUM CHLORIDE 0.9 % IJ SOLN
10.0000 mL | Freq: Two times a day (BID) | INTRAMUSCULAR | Status: DC
  Administered 2015-05-05: 10 mL

## 2015-05-04 SURGICAL SUPPLY — 43 items
BLADE SURG ROTATE 9660 (MISCELLANEOUS) IMPLANT
CANISTER SUCTION 2500CC (MISCELLANEOUS) ×3 IMPLANT
CHLORAPREP W/TINT 26ML (MISCELLANEOUS) ×3 IMPLANT
DRAPE LAPAROSCOPIC ABDOMINAL (DRAPES) ×3 IMPLANT
DRAPE UTILITY XL STRL (DRAPES) ×6 IMPLANT
DRAPE WARM FLUID 44X44 (DRAPE) ×1 IMPLANT
ELECT BLADE 6.5 EXT (BLADE) IMPLANT
ELECT CAUTERY BLADE 6.4 (BLADE) ×3 IMPLANT
ELECT REM PT RETURN 9FT ADLT (ELECTROSURGICAL) ×3
ELECTRODE REM PT RTRN 9FT ADLT (ELECTROSURGICAL) ×1 IMPLANT
GAUZE SPONGE 4X4 12PLY STRL (GAUZE/BANDAGES/DRESSINGS) ×3 IMPLANT
GLOVE BIO SURGEON STRL SZ7 (GLOVE) ×6 IMPLANT
GLOVE BIOGEL PI IND STRL 7.5 (GLOVE) ×1 IMPLANT
GLOVE BIOGEL PI INDICATOR 7.5 (GLOVE) ×6
GLOVE ECLIPSE 7.5 STRL STRAW (GLOVE) ×4 IMPLANT
GOWN STRL REUS W/ TWL LRG LVL3 (GOWN DISPOSABLE) ×2 IMPLANT
GOWN STRL REUS W/TWL LRG LVL3 (GOWN DISPOSABLE) ×9
KIT BASIN OR (CUSTOM PROCEDURE TRAY) ×3 IMPLANT
KIT ROOM TURNOVER OR (KITS) ×3 IMPLANT
LIGASURE SHORT ATLAS 20CM (ELECTROSURGICAL) ×3 IMPLANT
NDL HYPO 25GX1X1/2 BEV (NEEDLE) IMPLANT
NEEDLE HYPO 25GX1X1/2 BEV (NEEDLE) ×3 IMPLANT
NS IRRIG 1000ML POUR BTL (IV SOLUTION) ×6 IMPLANT
PACK GENERAL/GYN (CUSTOM PROCEDURE TRAY) ×3 IMPLANT
PAD ARMBOARD 7.5X6 YLW CONV (MISCELLANEOUS) ×6 IMPLANT
SPONGE GAUZE 4X4 12PLY STER LF (GAUZE/BANDAGES/DRESSINGS) ×2 IMPLANT
SPONGE LAP 18X18 X RAY DECT (DISPOSABLE) IMPLANT
STAPLER VISISTAT 35W (STAPLE) ×3 IMPLANT
SUCTION POOLE TIP (SUCTIONS) ×1 IMPLANT
SUT PDS AB 1 TP1 54 (SUTURE) IMPLANT
SUT PDS AB 1 TP1 96 (SUTURE) IMPLANT
SUT SILK 2 0 SH CR/8 (SUTURE) IMPLANT
SUT SILK 3 0 SH CR/8 (SUTURE) ×4 IMPLANT
SUT VICRYL 3 0 TIE (SUTURE) ×3 IMPLANT
SYR CONTROL 10ML LL (SYRINGE) ×2 IMPLANT
SYRINGE 20CC LL (MISCELLANEOUS) ×2 IMPLANT
TAPE CLOTH SURG 4X10 WHT LF (GAUZE/BANDAGES/DRESSINGS) ×2 IMPLANT
TOWEL OR 17X24 6PK STRL BLUE (TOWEL DISPOSABLE) ×1 IMPLANT
TOWEL OR 17X26 10 PK STRL BLUE (TOWEL DISPOSABLE) ×3 IMPLANT
TRAY FOLEY CATH 16FRSI W/METER (SET/KITS/TRAYS/PACK) IMPLANT
TUBE GASTROSTOMY 18F (CATHETERS) ×2 IMPLANT
WATER STERILE IRR 1000ML POUR (IV SOLUTION) ×3 IMPLANT
YANKAUER SUCT BULB TIP NO VENT (SUCTIONS) ×3 IMPLANT

## 2015-05-04 NOTE — Progress Notes (Signed)
BP on left leg caused pt pain therefore nurse tech refused to re-take.  179/65

## 2015-05-04 NOTE — Progress Notes (Addendum)
Patient ID: [name], female   DOB: 11-04-1945, 69 y.o.   MRN: [id number]    Subjective: Day of Surgery Procedure(s) (LRB): OPEN GASTROSTOMY WITH TUBE PLACEMENT (N/A) Patient reports pain as 2 on 0-10 scale.   Denies CP or SOB.  Voiding without difficulty. Positive flatus. Objective: Vital signs in last 24 hours: Temp:  [96.9 F (36.1 C)-98.6 F (37 C)] 96.9 F (36.1 C) (08/19 1059) Pulse Rate:  [65-88] 75 (08/19 1113) Resp:  [16-20] 19 (08/19 1113) BP: (112-140)/(58-67) 128/59 mmHg (08/19 1100) SpO2:  [94 %-100 %] 100 % (08/19 1113)  Intake/Output from previous day: 08/18 0701 - 08/19 0700 In: 2148.6 [I.V.:1798.6; IV Piggyback:350] Out: 1150 [Urine:1150] Intake/Output this shift: Total I/O In: 500 [I.V.:500] Out: -   Labs:  Recent Labs  05/02/15 0541 05/04/15 0410  HGB 11.0* 10.9*    Recent Labs  05/02/15 0541 05/04/15 0410  WBC 9.9 10.8*  RBC 4.10 4.03  HCT 33.7* 32.9*  PLT 294 313    Recent Labs  05/02/15 0541 05/04/15 0410  NA 133* 130*  K 4.0 3.7  CL 101 98*  CO2 27 23  BUN 7 6  CREATININE 0.35* 0.38*  GLUCOSE 106* 112*  CALCIUM 8.4* 8.3*   No results for input(s): LABPT, INR in the last 72 hours.  Physical Exam: Neurologically intact Sensation intact distally Incision: dressing C/D/I and no drainage Compartment soft  Assessment/Plan: Day of Surgery Procedure(s) (LRB): OPEN GASTROSTOMY WITH TUBE PLACEMENT (N/A) Up with therapy  The pt had a Gtube placed today Dietician has ordered nutrition Very low drainage from drain from incision in the neck Will order PICC line placement   [place]o, Darla Lesches for Dr. Melina Schools [facility] [phone] 05/04/2015, 12:10 PM    Doing well overall Minimal drainage noted  - consider removing drain tomorrow if nio output Possible d/c tomorrow if cleared by medicine/surgery Will set up [facility] nsg/pt  Will need iv abx per ID team

## 2015-05-04 NOTE — Care Management (Signed)
Utilization review completed. Devlin Mcveigh, RN Case Manager [phone]. 

## 2015-05-04 NOTE — Care Management Note (Signed)
Case Management Note  Patient Details  Name: [name] MRN: [id number] Date of Birth: Sep 20, 1945  Subjective/Objective:   69 yr old female s/p I & D of cervical abscess with drain placement, s/p placement of gastrostomy tube.                Action/Plan:  Case manager spoke with patient concerning home health and DME needs at discharge. Patient states she uses [place]. Referral called to [facility], [facility] Liaison. Patient states she has a rolling walker and 3in1. Case manager discussed need for [facility], she will receive PICC line for IV antibiotic infusion and gastrostomy tube for feedings. Tube feedings may be started over the weekend. Case manager spoke with Carolynn Sayers, [place] [facility] Specialist concerning patient's needs. Case manager will continue to monitor.   Expected Discharge Date:   (unknown)               Expected Discharge Plan:   Home with Home Health  In-House Referral:  NA  Discharge planning Services  CM Consult  Post Acute Care Choice:  Home Health RN, PT/OT Choice offered to:  Patient  DME Arranged:  N/A DME Agency:  NA  HH Arranged:  PT HH Agency:  [place]  Status of Service:  In process, will continue to follow  Medicare Important Message Given:  Yes-second notification given Date Medicare IM Given:    Medicare IM give by:    Date Additional Medicare IM Given:    Additional Medicare Important Message give by:     If discussed at [place] of Stay Meetings, dates discussed:    Additional Comments:  Ninfa Meeker, RN 05/04/2015, 4:07 PM

## 2015-05-04 NOTE — Progress Notes (Signed)
PT Cancellation Note  Patient Details Name: TRENIYAH LYNN MRN: [id number] DOB: 1945/12/08   Cancelled Treatment:    Reason Eval/Treat Not Completed: Patient at procedure or test/unavailable. Patient leaving for procedure, not available for session.    Cassell Clement, PT, CSCS Pager [phone] Office [phone]  05/04/2015, 8:50 AM

## 2015-05-04 NOTE — Anesthesia Preprocedure Evaluation (Signed)
Anesthesia Evaluation  Patient identified by MRN, date of birth, ID band Patient awake    Reviewed: Allergy & Precautions, NPO status , Patient's Chart, lab work & pertinent test results  Airway Mallampati: II  TM Distance: >3 FB Neck ROM: Full    Dental   Pulmonary asthma , pneumonia -, COPDCurrent Smoker,  breath sounds clear to auscultation        Cardiovascular hypertension, Pt. on medications + CAD and + Past MI Rhythm:Regular Rate:Normal     Neuro/Psych negative neurological ROS     GI/Hepatic negative GI ROS, Neg liver ROS,   Endo/Other  diabetes, Type 2  Renal/GU Renal disease     Musculoskeletal  (+) Arthritis -,   Abdominal   Peds  Hematology negative hematology ROS (+)   Anesthesia Other Findings   Reproductive/Obstetrics                             Anesthesia Physical  Anesthesia Plan  ASA: III  Anesthesia Plan: General   Post-op Pain Management:    Induction: Intravenous  Airway Management Planned: Oral ETT  Additional Equipment:   Intra-op Plan:   Post-operative Plan: Possible Post-op intubation/ventilation  Informed Consent: I have reviewed the patients History and Physical, chart, labs and discussed the procedure including the risks, benefits and alternatives for the proposed anesthesia with the patient or authorized representative who has indicated his/her understanding and acceptance.   Dental advisory given  Plan Discussed with: CRNA, Anesthesiologist and Surgeon  Anesthesia Plan Comments:         Anesthesia Quick Evaluation

## 2015-05-04 NOTE — Transfer of Care (Signed)
Immediate Anesthesia Transfer of Care Note  Patient: [name]  Procedure(s) Performed: Procedure(s): OPEN GASTROSTOMY WITH TUBE PLACEMENT (N/A)  Patient Location: PACU  Anesthesia Type:General  Level of Consciousness: awake, alert , oriented and patient cooperative  Airway & Oxygen Therapy: Patient Spontanous Breathing and Patient connected to nasal cannula oxygen  Post-op Assessment: Report given to RN, Post -op Vital signs reviewed and stable and Patient moving all extremities  Post vital signs: Reviewed and stable  Last Vitals:  Filed Vitals:   05/04/15 1059  BP:   Pulse:   Temp: 36.1 C  Resp:     Complications: No apparent anesthesia complications

## 2015-05-04 NOTE — Anesthesia Postprocedure Evaluation (Signed)
  Anesthesia Post-op Note  Patient: [name]  Procedure(s) Performed: Procedure(s): OPEN GASTROSTOMY WITH TUBE PLACEMENT (N/A)  Patient Location: PACU  Anesthesia Type:General  Level of Consciousness: awake and alert   Airway and Oxygen Therapy: Patient Spontanous Breathing  Post-op Pain: none  Post-op Assessment: Post-op Vital signs reviewed LLE Motor Response: Purposeful movement, Responds to commands LLE Sensation: Full sensation RLE Motor Response: Purposeful movement, Responds to commands RLE Sensation: Full sensation      Post-op Vital Signs: Reviewed  Last Vitals:  Filed Vitals:   05/04/15 1310  BP: 141/52  Pulse: 60  Temp: 36.7 C  Resp: 18    Complications: No apparent anesthesia complications

## 2015-05-04 NOTE — Progress Notes (Signed)
Report to [facility] RN as primary caregiver.

## 2015-05-04 NOTE — Progress Notes (Signed)
PT Cancellation Note  Patient Details Name: [name] MRN: [id number] DOB: December 20, 1945   Cancelled Treatment:    Reason Eval/Treat Not Completed: Patient declined, no reason specified . Patient stating that she does not want to do any therapy today too tired and in too much pain. Patient unwilling to attempt any mobility or bed exercises. Will continue to follow as able.   Cassell Clement, PT, CSCS Pager [phone] Office [phone]  05/04/2015, 2:57 PM

## 2015-05-04 NOTE — Anesthesia Procedure Notes (Signed)
Procedure Name: Intubation Date/Time: 05/04/2015 10:07 AM Performed by: Rebekah Chesterfield L Pre-anesthesia Checklist: Patient identified, Emergency Drugs available, Suction available, Patient being monitored and Timeout performed Patient Re-evaluated:Patient Re-evaluated prior to inductionOxygen Delivery Method: Circle system utilized Preoxygenation: Pre-oxygenation with 100% oxygen Intubation Type: IV induction Ventilation: Mask ventilation without difficulty Laryngoscope Size: Mac and 3 Grade View: Grade I Tube type: Oral Tube size: 7.5 mm Number of attempts: 1 Airway Equipment and Method: Stylet Placement Confirmation: ETT inserted through vocal cords under direct vision,  breath sounds checked- equal and bilateral and positive ETCO2 Secured at: 20 cm Tube secured with: Tape Dental Injury: Teeth and Oropharynx as per pre-operative assessment

## 2015-05-04 NOTE — Op Note (Signed)
[name], [name]                ACCOUNT NO.:  [id number]  MEDICAL RECORD NO.:  [id number]  LOCATION:  [id number]                        FACILITY:  [facility]  PHYSICIAN:  Taven Strite D. Shon Baton, M.D. DATE OF BIRTH:  [birth year]  DATE OF PROCEDURE:  05/03/2015 DATE OF DISCHARGE:                              OPERATIVE REPORT   PREOPERATIVE DIAGNOSIS:  Cervical abscess.  POSTOPERATIVE DIAGNOSIS:  Cervical abscess.  OPERATIVE PROCEDURE: 1. Removal of anterior cervical plate. 2. Exploration of spinal fusion. 3. Incision and drainage of cervical abscess with placement of drains.  COMPLICATIONS:  None.  HISTORY:  This is a very pleasant 69 year old woman, who 6 years ago had a C5 corpectomy with 4 to 6 instrumented fusion.  The patient subsequently did well and was discharged from my care.  She states that she was having some difficulty swallowing for about the last 2-3 months and ultimately presented to the emergency room with a hip dislocation. I reduced this and subsequently because of the concern for pneumonia and given her COPD, I ordered a CT scan.  On the CT scan of her chest, there was identifying of an abscess at C7-T1, so cervical CT scan was done, which further demonstrated the cervical abscess with diskitis at L3-L4. Subsequent MRI showed an epidural phlegmon, no epidural abscess though. As a result, we elected to take her to the operating room for the aforementioned procedure.  Dr. Lazarus Salines from ENT did do a preoperative rigid scope evaluation of the esophagus and found a defect in the esophagus most likely secondary to erosion from the plate.  Please refer to Dr. Raye Sorrow dictation for specifics on his procedure with his findings.  OPERATIVE NOTE:  The patient was brought to the operating room, placed supine on the operating table.  After successful induction of general anesthesia and endotracheal intubation, TEDs, SCDs were applied and Dr. Lazarus Salines performed his rigid endoscopy of  the esophagus.  Once this was completed, the patient was then repositioned for anterior cervical surgery.  The neck was prepped and draped in a standard fashion.  A time- out was taken to confirm patient, procedure, and all other pertinent important data.  Once this was done, a right-sided Smith-Robinson approach was taken to the cervical spine.  A longitudinal incision was made along the medial border of sternocleidomastoid.  A sharp dissection was carried out down to the platysma.  The platysma was divided, and I sharply dissected along the medial border of the sternocleidomastoid. The external jugular was very prominent and this was protected.  I continued my dissection down.  I identified the omohyoid.  I did not feel as though this was required to be sacrificed for visualization.  I continued the dissection sharply down.  I identified the carotid sheath and protected it with a finger.  I then identified the esophagus and began using Kittner  dissectors to mobilize it over to the left side.  I then started down at C7 and proceeded in a cephalad manner.  I mobilized the esophagus and then visualized the scar tissue that had formed over the plate.  Once I had the esophagus mobilized, I retracted it using handheld retractors only.  I then incised  the overlying scar, which was completely intact and exposed the plate.  There was no migration of the screws.  Once I had complete plate exposed, I sequentially removed each of the screws.  All the screws actually had good purchase.  There was no gross loosening of the plate.  I then debrided some of the adherent phlegmon and sent this to Microbiology.  I also debrided down and seated to about the level of T2-T1 and took cultures at the lower end of the cervical spine and at the upper end of the cervical spine.  Once the plate was out, I exposed the fusion, and it was solid to direct palpation.  At this point, I then pulse lavaged the neck with  low pressure with 3 L. I then placed a drain coming out  superiorly, going down towards the C7- T1 area, and I put a second drain going up towards the C2-C3 region. The esophagus was returned to the midline, and I closed the platysma with interrupted 2-0 Vicryl sutures and a 3-0 Prolene for the skin. Both drains were sutured in and connected to the Hemovac.  The patient was then ultimately extubated and transferred to PACU without incident.  At the end the case, all needle and sponge counts were correct.     Newell Frater D. Shon Baton, M.D.     DDB/MEDQ  D:  05/03/2015  T:  05/04/2015  Job:  [id number]

## 2015-05-04 NOTE — Progress Notes (Signed)
PROGRESS NOTE  CHANIN CLOUGHERTY VWU:[id number] DOB: 04/17/1946 DOA: 04/26/2015 PCP: Redmond Baseman, MD Brief History 69 year old Caucasian female with a past medical history of coronary artery disease, insulin-dependent diabetes, COPD, history of breast cancer presented with right hip pain. She was noted to have a right hip prosthesis dislocation. She was also noted to have a fever. She was recently treated for pneumonia with Levaquin. CT scan of the chest revealed fluid collection in the lower neck and upper chest area. Patient was hospitalized for further management. She had a closed reduction of her hip dislocation. 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 There was concern for esophageal perforation. ENT was consulted. Orthopedics has been involved. The patient was transferred to [facility] for removal of hardware, irrigation and debridement and esophagoscopy. Assessment/Plan: Spondylodiskitis of cervical spine -04/30/15 MR of C-spine--diskitis and osteo of C3-4 and abnormal edema of retropharyngeal tissues with fluid collection anterior to C7 05/03/15--I&D with removal of hardware--Dr. Sheela Stack -abx stopped prior to surgery to maximize cultures-->restart vanc and merrem post op -abx per ID -anticipate 6-8 weeks IV abx -await final culture data Esophageal perforation -appreciate Dr. Lazarus Salines -8/816 esophagascopy--40mm posterior wall perforation -npo for at least 1 month, then repeat esophagram -PEG placed on 05/04/15 -Appreciate general surgery  Dislocation of the right hip prosthesis -Seen by orthopedics.  -had a closed reduction on the morning of 8/12. Management per orthopedics.  -ID has recommended to consider MRI of the hip in case that is the source of infection. They will address this after surgery. Her hip replacement was in 2013, per  patient. chronic low back pain/Chronic Pain Syndrome -Noted to be on long-acting narcotics at home.  -will not be able to use MS Contin with PEG -05/04/15--I spoke with her pain management team, Allen Derry, PA-C-->he recommended start Fentanyl 25 mcg transdermal -scheduled follow up appointment on 05/08/15 at 145PM with Dr. Sheran Luz who is the patient's pain management physician  -Adjust her pain medications to IV until PEG able to be used Abnormal appearing gallbladder on CT scan LFTs are unremarkable. Patient remains nontender in the right upper quadrant. Ultrasound is as above. Gallbladder distention and minimal ductal dilatation was appreciated. But no cholecystitis identified. In view of the fact that her LFTs are unremarkable and the patient is asymptomatic, do not anticipate further evaluation currently. This can be addressed in the future with outpatient general surgery follow-up.  Hyponatremia,  -chronic dating back to 08/2014--127-132 -monitor off diuretics Hypertension -Hold losartan secondary to soft blood pressure - Hold furosemide secondary soft blood pressure  hypokalemia, hypomagnesemia -repleted  Normocytic anemia Continue to monitor hemoglobin. Some drop in the hemoglobin is likely due to dilution. -baseline Hgb 10-11  History of coronary artery disease Stable. Continue to monitor. -continue ASA  History of COPD/tobacco abuse Continue nebulizer treatments. Inhalers. -Presently stable on 2 L nasal cannula -Pulmonary hygiene -Tobacco cessation discussed  History of diabetes mellitus type 2 -Sliding scale insulin coverage. Holding her oral agents.  DISPOSITION--CANNOT GO HOME UNTIL FINAL SUSCEPTIBILITY FROM CULTURES RETURN AND PATIENT IS TOLERATING TUBE FEED AT GOAL RATE -ANTICIPATE SUN OR MON. DISCHARGE             Procedures/Studies: Dg Cervical Spine 1 View  05/03/2015   CLINICAL DATA:  Cervical abscess and hardware removal  EXAM: DG CERVICAL  SPINE - 1  VIEW  COMPARISON:  MRI cervical spine dated 04/30/2015  FINDINGS: Single lateral view of the cervical spine.  Surgical clamp is partially obscured by the patient's shoulders and likely marks the upper thoracic spine, possibly the inferior margin of T1, although poorly visualized.  The C2 through C7 vertebral bodies are marked on the intraoperative image.  IMPRESSION: Intraoperative cervical localization as above.   Electronically Signed   By: Charline Bills M.D.   On: 05/03/2015 09:52   Ct Soft Tissue Neck W Contrast  04/27/2015   CLINICAL DATA:  Paravertebral abscess. Abnormal CT chest yesterday. Cervical fusion 2010  EXAM: CT NECK WITH CONTRAST  TECHNIQUE: Multidetector CT imaging of the neck was performed using the standard protocol following the bolus administration of intravenous contrast.  CONTRAST:  OMNIPAQUE IOHEXOL 300 MG/ML  SOLN  COMPARISON:  CT chest 04/26/2015  FINDINGS: Pharynx and larynx: Normal tongue and oropharynx. Normal larynx. Extensive prevertebral soft tissue swelling and gas bubbles. Prevertebral swelling and fluid begins at approximately C2 and extends to T1. Rim enhancing fluid collection to the left of T1 compatible with abscess as noted on chest CT.  Salivary glands: Negative  Thyroid: Negative  Lymph nodes: Negative for adenopathy.  Vascular: Carotid artery patent bilaterally with mild atherosclerotic disease. Jugular vein patent bilaterally without thrombus.  Limited intracranial: Negative  Visualized orbits: Not imaged  Mastoids and visualized paranasal sinuses: Clear  Skeleton: ACDF C4 through C6. Anterior plate in good position. Solid fusion with strut graft. No bony destruction at these levels. There is spondylosis at C4-5 causing spinal and foraminal stenosis. There is also foraminal encroachment bilaterally at C5-6 due to spurring.  4 mm retrolisthesis C3-4 with disc degeneration and spurring. Severe spinal stenosis. Endplates are irregular. Osteomyelitis and  discitis not excluded at this level as a cause for infection. Cervical spine MRI would be helpful to evaluate for spinal infection and to exclude epidural abscess.  Upper chest: 6 mm right upper lobe nodule again noted and unchanged. Small left pleural effusion.  IMPRESSION: Extensive prevertebral edema and fluid mixed with gas bubbles. This is anterior to anterior plate fusion of C4 through C7. This is most likely arising from the spine. The infection could be arising from an infected fusion plate or possibly from discitis and osteomyelitis at C3-4. There is endplate irregularity at C3-4 with prominent posterior osteophyte formation. There is severe spinal stenosis at C3-4 and moderate to severe spinal stenosis at C4-5 due to spurring. 15 mm abscess to the left of T1.  MRI cervical spine without and with contrast suggested to exclude osteomyelitis of the spine and rule out epidural abscess.  These results were called by telephone at the time of interpretation on 04/27/2015 at 9:18 am to Dr. Flo Shanks , who verbally acknowledged these results.   Electronically Signed   By: Marlan Palau M.D.   On: 04/27/2015 09:08   Ct Chest W Contrast  04/26/2015   CLINICAL DATA:  Fever and cough.  Evaluate for pneumonia.  EXAM: CT CHEST WITH CONTRAST  TECHNIQUE: Multidetector CT imaging of the chest was performed during intravenous contrast administration.  CONTRAST:  80mL OMNIPAQUE IOHEXOL 300 MG/ML  SOLN  COMPARISON:  Chest radiograph 04/04/2015.  Chest CT 08/29/2014  FINDINGS: There is abnormal soft tissue in the lower neck along the left side of the thyroid gland and surrounding the upper esophagus. There is a small focal low-density structure along the left anterior aspect of C7 and T1. This collection has a small focus  of gas within it and measures 1.1 x 0.9 x 2.0 cm. This collection is compatible with a soft tissue abscess. There is no significant fat plane between the proximal esophagus and the soft tissue abscess.  Soft tissues in the left subcarinal region and distal esophageal region are indistinct. Difficult to exclude inflammatory changes around the distal esophageal region, particularly on sequence 2, image 31.  There are coronary artery calcifications. The gallbladder is distended and there is at least mild intrahepatic biliary dilatation. Limited evaluation of the anterior right liver due to artifact and motion. There may be abnormal enhancement along the anterior right hepatic lobe. In addition, there are slightly prominent lymph nodes in the upper abdomen and the left periaortic region. Some of these nodes were present on the exam from 2012.  The trachea and mainstem bronchi are patent. There appears chronic or recurrent filling defects in the left lower lobe bronchus and left lower lobe bronchi. There is chronic volume loss in the left lower lobe. Chronic patchy parenchymal densities in left lower lobe. Linear densities in medial right lower lobe could represent scar or atelectasis. There is a chronic of sub solid nodular structure in the anterior right upper lobe on sequence 4, image 18 measuring 5 mm. This has not significantly changed since 2012.  There is an anterior surgical plate in the cervical spine extending down to C6. There is no evidence for bone destruction involving the lower cervical spine or upper thoracic spine adjacent to the area of inflammation and abscess collection.  IMPRESSION: There is abnormal soft tissue in the lower neck and upper posterior mediastinum which likely represents edema and an inflammatory process. There is a small air-fluid collection along the left side of the cervical-thoracic junction which is compatible with an abscess. There is no clear evidence for bone destruction. The source for this inflammatory process is unclear. Upper esophagus is poorly characterized.  There are endobronchial filling defects in the left lower lobe airways. Similar findings were present on prior  examinations and this could represent an area of recurrent mucous plugging or aspiration. There is some chronic volume loss in the left lower lobe.  The soft tissue along the mid and distal esophagus is indistinct. Difficult to exclude an inflammatory process in this region.  Gallbladder distension with at least mild intrahepatic biliary dilatation. Difficult to exclude abnormal enhancement along the anterior right hepatic lobe. This area could be further characterized with ultrasound.  Critical Value/emergent results were called by telephone at the time of interpretation on 04/26/2015 at 7:47 pm to Dr. Elwin Mocha , who verbally acknowledged these results.   Electronically Signed   By: Richarda Overlie M.D.   On: 04/26/2015 20:03   Mr Cervical Spine W Wo Contrast  04/30/2015   CLINICAL DATA:  Followup C3-4 infection  EXAM: MRI CERVICAL SPINE WITHOUT AND WITH CONTRAST  TECHNIQUE: Multiplanar and multiecho pulse sequences of the cervical spine, to include the craniocervical junction and cervicothoracic junction, were obtained according to standard protocol without and with intravenous contrast.  CONTRAST:  11mL MULTIHANCE GADOBENATE DIMEGLUMINE 529 MG/ML IV SOLN  COMPARISON:  MRI 3 days ago.  CT 3 days ago.  FINDINGS: No change by imaging at this point. The foramen magnum is widely patent. C1-2 and C2-3 are unremarkable.  C3-4: Findings consistent with discitis osteomyelitis at this level. Retrolisthesis of 2 mm. Endplate osteophytes. Narrowing of the canal with AP diameter of 5 mm. Effacement of the subarachnoid space surrounding the cord. Slight deformity of  the cord. No cord edema. Some epidural enhancement at this level consistent with epidural inflammation but no evidence of compressive epidural abscess or fluid collection.  Previous fusion from C4 through C6 with strut graft. Sufficient patency of the canal through that region. No epidural collection.  C6-7: Facet degeneration with 1 or 2 mm of anterolisthesis. No  compressive canal stenosis. Mild foraminal narrowing bilaterally.  C7-T1: Abnormal edema and enhancement of the C7 vertebral body worrisome for osteomyelitis at this level as well. Abnormal fluid anterior and to the left of the C7 vertebral body consistent with adjacent soft tissue abscess.  IMPRESSION: No worrisome intraspinal change since the study of 3 days ago. Continuing evidence of discitis osteomyelitis at C3-4. Spinal canal narrowing with an AP diameter of 5 mm. To some deformity of the cord without cord edema. Epidural enhancement without evidence of drainable epidural collection.  Abnormal edema and enhancement of the C7 vertebral body consistent with osteomyelitis of that level as well.  Abnormal edema in the retropharyngeal tissues including a fluid collection anterior and to the left of the C7 vertebral body as seen previously.   Electronically Signed   By: Paulina Fusi M.D.   On: 04/30/2015 10:56   Mr Cervical Spine W Wo Contrast  04/27/2015   CLINICAL DATA:  Neck abscess. Dysphagia. Rule out discitis and osteomyelitis. Cervical fusion 2010.  EXAM: MRI CERVICAL SPINE WITHOUT AND WITH CONTRAST  TECHNIQUE: Multiplanar and multiecho pulse sequences of the cervical spine, to include the craniocervical junction and cervicothoracic junction, were obtained according to standard protocol without and with intravenous contrast.  CONTRAST:  12mL MULTIHANCE GADOBENATE DIMEGLUMINE 529 MG/ML IV SOLN  COMPARISON:  CT neck 04/27/2015  FINDINGS: The patient was not able to hold still. Image quality degraded by moderate motion.  ACDF with anterior plate C4 through C6. Fusion appears solid. No significant enhancement related to the fusion. Moderate to severe spinal stenosis at C4-5 due to spurring. Marked foraminal encroachment bilaterally at C4-5. Mild to moderate spinal and moderate foraminal stenosis bilaterally at C5-6.  There is abnormal bone marrow edema and enhancement throughout C3 and C4. The disc space is  ill-defined and appears eroded. There is enhancement of the ventral epidural soft tissues which may represent infection/epidural abscess. There is posterior osteophyte formation and severe spinal stenosis with cord compression. Possible mild cord edema. There is also edema and enhancement involving the posterior elements of C3 which may be related to infection as well.  Extensive prevertebral soft tissue swelling from C2 through T1. Gas bubbles are noted in the prevertebral tissues on CT. 15 mm epidural abscess is present to the left of T1, better seen on the prior CT.  IMPRESSION: Extensive prevertebral soft tissue edema with gas bubbles in the tissues consistent with infection. 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 Enhancement extends into the posterior elements of C3 which may also be infected. There is ventral epidural enhancement which may be due to abscess/ phlegmon. There is severe spinal stenosis and cord compression at C3-4. Cervical fusion at C4-5 and C5-6. Edema and enhancement extends into the C4 vertebral body which could be infected. Evidence of abnormal enhancement at C5 or C6. Moderate to severe spinal stenosis at C4-5 and mild to moderate spinal stenosis at C5-6.  15 mm epidural abscess to the left of T1, better seen on the prior CT.  These results were called by telephone at the time of interpretation on 04/27/2015 at 3:00 pm to Dr. Flo Shanks ,  who verbally acknowledged these results.   Electronically Signed   By: Marlan Palau M.D.   On: 04/27/2015 15:03   Dg Pelvis Portable  04/26/2015   CLINICAL DATA:  Right hip dislocation.  EXAM: PORTABLE PELVIS 1-2 VIEWS  COMPARISON:  03/26/2015  FINDINGS: The right hip prosthesis is dislocated with the femoral component displacing superior to the acetabular component. The prosthetic components remain well seated. There is no acute fracture.  Left hip prosthesis is well-seated and aligned.  Bones are demineralized.  IMPRESSION:  Dislocated right hip prosthesis. No prosthetic loosening or evidence of a fracture.   Electronically Signed   By: Amie Portland M.D.   On: 04/26/2015 17:00   Dg Hip Port Unilat With Pelvis 1v Right  04/26/2015   CLINICAL DATA:  69 year old female with right hip arthroplasty complicated by recent right hip dislocation status post right hip reduction.  EXAM: DG HIP (WITH OR WITHOUT PELVIS) 1V PORT RIGHT  COMPARISON:  04/26/2015.  FINDINGS: Postoperative changes of right total hip arthroplasty again noted. The prosthetic femoral head has been successfully relocated within the prosthetic acetabulum. No periprosthetic fractures are identified. Iodinated contrast material in the urinary bladder incidentally noted, presumably related to recent contrast enhanced chest CT.  IMPRESSION: 1. Successful relocation of right hip, without acute complicating features.   Electronically Signed   By: Trudie Reed M.D.   On: 04/26/2015 20:19   US Abdomen Limited Ruq  04/27/2015   CLINICAL DATA:  Cholecystitis, incidental finding of gallbladder distension on recent emergency room workup  EXAM: US ABDOMEN LIMITED - RIGHT UPPER QUADRANT  COMPARISON:  None.  FINDINGS: Gallbladder:  Distended gallbladder. Echogenic foci along the gallbladder wall without shadowing which may reflect gallbladder sludge. Unable to place the patient in decubitus position. No gallbladder wall thickening or pericholecystic fluid. No sonographic Murphy sign noted.  Common bile duct:  Diameter: 5.7 mm  Liver:  No focal hepatic mass. Mild intrahepatic biliary ductal dilatation. Small amount of perihepatic fluid. Otherwise normal hepatic echogenicity.  IMPRESSION: 1. Gallbladder distention and mild intrahepatic biliary ductal dilatation. No sonographic findings to suggest acute cholecystitis. 2. Small amount of echogenic material in the gallbladder dependently which may reflect gallbladder sludge. 3. Small amount of perihepatic fluid.   Electronically Signed    By: Elige Ko   On: 04/27/2015 09:35   Dg Esophagus W/water Sol Cm  04/30/2015   CLINICAL DATA:  Postop from cervical spine fusion. Severe neck pain and cervical spine osteomyelitis. Evaluate for esophageal perforation.  EXAM: ESOPHOGRAM/BARIUM SWALLOW  TECHNIQUE: Single contrast examination was performed using 50 mL water-soluble Omnipaque 300.  FLUOROSCOPY TIME:  Fluoroscopy Time:  2 minutes 52 seconds  Number of Acquired Images:  7  COMPARISON:  None.  FINDINGS: Exam was technically difficult due to severe patient pain, limited mobility, and limited ability to cooperate with positioning.  Anterior cervical spine fixation hardware is noted. There is no evidence of esophageal contrast leak or perforation. No evidence of esophageal obstruction or dilatation.  IMPRESSION: Technically difficult exam. No evidence of esophageal leak or perforation.   Electronically Signed   By: Myles Rosenthal M.D.   On: 04/30/2015 12:34         Subjective: Patient is complaining of back pain and abdominal pain at the gastrostomy tube site. Denies any chest pain, shortness breath, vomiting, diarrhea, dysuria, hematuria, headache. Denies any fevers or chills.  Objective: Filed Vitals:   05/04/15 1100 05/04/15 1113 05/04/15 1200 05/04/15 1310  BP: 128/59  141/52  Pulse: 88 75  60  Temp:   96.4 F (35.8 C) 98 F (36.7 C)  TempSrc:    Oral  Resp: 20 19  18   Height:      Weight:      SpO2: 94% 100%  100%    Intake/Output Summary (Last 24 hours) at 05/04/15 1509 Last data filed at 05/04/15 1230  Gross per 24 hour  Intake 1528.58 ml  Output    300 ml  Net 1228.58 ml   Weight change:  Exam:   General:  Pt is alert, follows commands appropriately, not in acute distress  HEENT: No icterus, No thrush, No neck mass, /AT  Cardiovascular: RRR, S1/S2, no rubs, no gallops  Respiratory: Bibasilar rales. No wheezing. Good air movement.  Abdomen: Soft/+BS, non tender, non distended, no guarding; no  hepatosplenomegaly.  Extremities: No edema, No lymphangitis, No petechiae, No rashes, no synovitis; no cyanosis or clubbing  Data Reviewed: Basic Metabolic Panel:  Recent Labs Lab 04/28/15 0350  04/29/15 1810 04/30/15 0405 05/01/15 0516 05/02/15 0541 05/04/15 0410  NA 134*  < > 132* 133* 133* 133* 130*  K 3.2*  < > 3.6 3.5 3.4* 4.0 3.7  CL 100*  < > 97* 99* 99* 101 98*  CO2 25  < > 25 26 26 27 23   GLUCOSE 109*  < > 137* 130* 111* 106* 112*  BUN 8  < > <5* <5* 5* 7 6  CREATININE 0.39*  < > 0.33* 0.32* 0.33* 0.35* 0.38*  CALCIUM 8.1*  < > 7.9* 8.3* 8.2* 8.4* 8.3*  MG 1.7  --   --  1.6*  --  1.7  --   < > = values in this interval not displayed. Liver Function Tests:  Recent Labs Lab 04/28/15 0350  AST 14*  ALT 10*  ALKPHOS 111  BILITOT 0.9  PROT 5.8*  ALBUMIN 2.1*   No results for input(s): LIPASE, AMYLASE in the last 168 hours. No results for input(s): AMMONIA in the last 168 hours. CBC:  Recent Labs Lab 04/28/15 0350 04/29/15 0335 05/01/15 0516 05/02/15 0541 05/04/15 0410  WBC 7.8 8.2 11.0* 9.9 10.8*  HGB 9.9* 10.2* 11.5* 11.0* 10.9*  HCT 29.3* 30.2* 34.5* 33.7* 32.9*  MCV 83.0 81.0 81.0 82.2 81.6  PLT 231 256 291 294 313   Cardiac Enzymes: No results for input(s): CKTOTAL, CKMB, CKMBINDEX, TROPONINI in the last 168 hours. BNP: Invalid input(s): POCBNP CBG:  Recent Labs Lab 05/04/15 0227 05/04/15 0607 05/04/15 0846 05/04/15 1113 05/04/15 1227  GLUCAP 144* 102* 111* 122* 103*    Recent Results (from the past 240 hour(s))  Blood Culture (routine x 2)     Status: None   Collection Time: 04/26/15  5:27 PM  Result Value Ref Range Status   Specimen Description BLOOD LEFT FOREARM  Final   Special Requests BOTTLES DRAWN AEROBIC AND ANAEROBIC 5CC  Final   Culture   Final    NO GROWTH 5 DAYS Performed at [facility]    Report Status 05/01/2015 FINAL  Final  Blood Culture (routine x 2)     Status: None   Collection Time: 04/26/15  5:29 PM   Result Value Ref Range Status   Specimen Description BLOOD LEFT ANTECUBITAL  Final   Special Requests BOTTLES DRAWN AEROBIC AND ANAEROBIC 5CC  Final   Culture   Final    NO GROWTH 5 DAYS Performed at [facility]    Report Status 05/01/2015 FINAL  Final  Urine culture     Status: None   Collection Time: 04/26/15  5:48 PM  Result Value Ref Range Status   Specimen Description URINE, CLEAN CATCH  Final   Special Requests NONE  Final   Culture   Final    NO GROWTH 2 DAYS Performed at [facility]    Report Status 04/28/2015 FINAL  Final  MRSA PCR Screening     Status: None   Collection Time: 04/27/15 12:25 AM  Result Value Ref Range Status   MRSA by PCR NEGATIVE NEGATIVE Final    Comment:        The GeneXpert MRSA Assay (FDA approved for NASAL specimens only), is one component of a comprehensive MRSA colonization surveillance program. It is not intended to diagnose MRSA infection nor to guide or monitor treatment for MRSA infections.   C difficile quick scan w PCR reflex     Status: None   Collection Time: 04/28/15  3:10 AM  Result Value Ref Range Status   C Diff antigen NEGATIVE NEGATIVE Final   C Diff toxin NEGATIVE NEGATIVE Final   C Diff interpretation Negative for toxigenic C. difficile  Final  Surgical pcr screen     Status: None   Collection Time: 05/03/15  5:17 AM  Result Value Ref Range Status   MRSA, PCR NEGATIVE NEGATIVE Final   Staphylococcus aureus NEGATIVE NEGATIVE Final    Comment:        The Xpert SA Assay (FDA approved for NASAL specimens in patients over 66 years of age), is one component of a comprehensive surveillance program.  Test performance has been validated by [facility] for patients greater than or equal to 60 year old. It is not intended to diagnose infection nor to guide or monitor treatment.   Anaerobic culture     Status: None (Preliminary result)   Collection Time: 05/03/15  8:36 AM  Result Value Ref Range Status     Specimen Description ABSCESS  Final   Special Requests SPEC A RETAINED HARDWARE AND CERVICAL ABSCESS  Final   Gram Stain PENDING  Incomplete   Culture   Final    NO ANAEROBES ISOLATED; CULTURE IN PROGRESS FOR 5 DAYS Performed at [facility]    Report Status PENDING  Incomplete  Gram stain     Status: None   Collection Time: 05/03/15  8:36 AM  Result Value Ref Range Status   Specimen Description ABSCESS  Final   Special Requests SPEC A RETAINED HARDWARE AND CERVICAL ABSCESS  Final   Gram Stain   Final    FEW WBC PRESENT,BOTH PMN AND MONONUCLEAR NO ORGANISMS SEEN    Report Status 05/03/2015 FINAL  Final  Culture, routine-abscess     Status: None (Preliminary result)   Collection Time: 05/03/15  8:36 AM  Result Value Ref Range Status   Specimen Description ABSCESS  Final   Special Requests SPEC A RETAINED HARDWARE AND CERVICAL ABCESS  Final   Gram Stain   Final    RARE WBC PRESENT,BOTH PMN AND MONONUCLEAR NO SQUAMOUS EPITHELIAL CELLS SEEN NO ORGANISMS SEEN Performed at [facility]    Culture   Final    NO GROWTH 1 DAY Performed at [facility]    Report Status PENDING  Incomplete  Anaerobic culture     Status: None (Preliminary result)   Collection Time: 05/03/15  8:42 AM  Result Value Ref Range Status   Specimen Description ABSCESS  Final  Special Requests SPEC B NECK ABCESS C3  Final   Gram Stain   Final    NO WBC SEEN NO SQUAMOUS EPITHELIAL CELLS SEEN NO ORGANISMS SEEN Performed at [facility]    Culture   Final    NO ANAEROBES ISOLATED; CULTURE IN PROGRESS FOR 5 DAYS Performed at [facility]    Report Status PENDING  Incomplete  Gram stain     Status: None   Collection Time: 05/03/15  8:42 AM  Result Value Ref Range Status   Specimen Description ABSCESS  Final   Special Requests SPEC B NECK ABCESS C3  Final   Gram Stain   Final    FEW WBC PRESENT,BOTH PMN AND MONONUCLEAR NO ORGANISMS SEEN    Report Status  05/03/2015 FINAL  Final  Culture, routine-abscess     Status: None (Preliminary result)   Collection Time: 05/03/15  8:42 AM  Result Value Ref Range Status   Specimen Description ABSCESS  Final   Special Requests SPEC B NECK ABCESS C3  Final   Gram Stain   Final    NO WBC SEEN NO SQUAMOUS EPITHELIAL CELLS SEEN NO ORGANISMS SEEN Performed at [facility]    Culture   Final    NO GROWTH 1 DAY Performed at [facility]    Report Status PENDING  Incomplete  Gram stain     Status: None   Collection Time: 05/03/15  9:04 AM  Result Value Ref Range Status   Specimen Description TISSUE  Final   Special Requests PHLEGMA FROM BEHIND EXPLATED PLATE  Final   Gram Stain   Final    FEW WBC PRESENT,BOTH PMN AND MONONUCLEAR NO ORGANISMS SEEN    Report Status 05/03/2015 FINAL  Final  Anaerobic culture     Status: None (Preliminary result)   Collection Time: 05/03/15  9:05 AM  Result Value Ref Range Status   Specimen Description TISSUE  Final   Special Requests PHLEGMA FROM BEHIND EXPLATED PLATE  Final   Gram Stain PENDING  Incomplete   Culture   Final    NO ANAEROBES ISOLATED; CULTURE IN PROGRESS FOR 5 DAYS Performed at [facility]    Report Status PENDING  Incomplete  Tissue culture     Status: None (Preliminary result)   Collection Time: 05/03/15  9:05 AM  Result Value Ref Range Status   Specimen Description TISSUE  Final   Special Requests PHLEGMA FROM BEHIND EXPLATED PLATE  Final   Gram Stain   Final    NO WBC SEEN NO ORGANISMS SEEN Performed at [facility]    Culture   Final    NO GROWTH 1 DAY Performed at [facility]    Report Status PENDING  Incomplete     Scheduled Meds: . enoxaparin (LOVENOX) injection  40 mg Subcutaneous Q24H  . HYDROmorphone      . HYDROmorphone      . HYDROmorphone      . insulin aspart  0-9 Units Subcutaneous Q4H  . meropenem (MERREM) IV  1 g Intravenous Q8H  . sodium chloride  3 mL Intravenous Q12H    . vancomycin  750 mg Intravenous Q8H   Continuous Infusions: . sodium chloride 250 mL (05/03/15 1244)  . lactated ringers 10 mL/hr at 05/04/15 0912     Lamis Behrmann, DO  [facility] Pager [phone]  If 7PM-7AM, please contact night-coverage [email or web address] Password [email or web address] 05/04/2015, 3:09 PM   LOS: 8 days

## 2015-05-04 NOTE — Progress Notes (Addendum)
Nutrition Follow-up  DOCUMENTATION CODES:   Not applicable  INTERVENTION:    Once G-tube ready to be used, recommend Jevity 1.2 formula -- initiate at 25 ml/hr and increase by 10 ml every 4 hours to goal rate of 55 ml/hr   TF regimen to provide 1584 kcals, 73 gm protein, 1065 ml of free water  NUTRITION DIAGNOSIS:   Inadequate oral intake related to inability to eat as evidenced by NPO status, ongoing  GOAL:   Patient will meet greater than or equal to 90% of their needs, currently unmet  MONITOR:   TF tolerance, Labs, Weight trends, I & O's  ASSESSMENT:   69 y.o. female with a past medical history of CAD, status post MI, insulin requiring type 2 diabetes, COPD, tobacco abuse disorder, EtOH abuse, hypertension, hyperlipidemia, history of breast cancer who was brought to the emergency department after having hip pain secondary to her third prosthetic hip joint dislocation in the recent past. As per husband, she was trying to shave her legs when the dislocation happened. He also states that for the past week or so she hasn't been feeling well, has had fatigue, chills, decreased appetite, cough and was noticed to have a picture of 102.25F.   Pt s/p procedure 8/19: OPEN GASTROSTOMY TUBE PLACEMENT  Pt s/p procedures 8/18: INCISION AND DRAINAGE CERVICALABSCESS AND REMOVAL OF HARDWARE  HARDWARE REMOVAL  RIGID ESOPHAGOSCOPY  DIRECT LARYNGOSCOPY  Pt currently in PACU.  S/p bedside swallow evaluation 8/17.  Pt with functional swallow.  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  Plan is for pt to remain NPO x 1 month.  G-tube placed for nutrition support.  RD unable to complete Nutrition Focused Physical Exam at this time.  Diet Order:  Diet NPO time specified  Skin:  Reviewed, no issues  Last BM:  8/19  Height:   Ht Readings from Last 1 Encounters:  04/27/15 5\' 2"  (1.575 m)    Weight:   Wt Readings from Last 1 Encounters:   04/30/15 123 lb 3.8 oz (55.9 kg)    Ideal Body Weight:  50 kg (kg)  BMI:  Body mass index is 22.53 kg/(m^2).  Estimated Nutritional Needs:   Kcal:  1500-1700  Protein:  70-80 gm  Fluid:  1.5-1.7 L  EDUCATION NEEDS:   No education needs identified at this time  Arthur Holms, RD, LDN Pager #: [phone] After-Hours Pager #: [phone]

## 2015-05-04 NOTE — Progress Notes (Signed)
Patient ID: [name], female   DOB: [birth year], 69 y.o.   MRN: [id number]         [facility]    Date of Admission:  04/26/2015   Total days of antibiotics 9          Principal Problem:   Osteomyelitis of cervical spine Active Problems:   Mediastinal abscess   Abscess of neck   Recurrent dislocation of hip joint prosthesis   Dysphagia, pharyngoesophageal phase   Diabetes mellitus type 2, insulin dependent   COPD (chronic obstructive pulmonary disease)   Tobacco dependence   Hyperlipidemia   Hypertension   Hypokalemia   Hyponatremia   Hip dislocation, right   Healthcare-associated pneumonia   Cholecystitis, acute   Esophageal perforation   Loss of weight   Abscess   Discitis   HCAP (healthcare-associated pneumonia)   Cholecystitis   Esophageal fistula   . enoxaparin (LOVENOX) injection  40 mg Subcutaneous Q24H  . fentaNYL  25 mcg Transdermal Q72H  . HYDROmorphone      . HYDROmorphone      . HYDROmorphone      . insulin aspart  0-9 Units Subcutaneous Q4H  . meropenem (MERREM) IV  1 g Intravenous Q8H  . sodium chloride  3 mL Intravenous Q12H  . vancomycin  750 mg Intravenous Q8H    SUBJECTIVE: She is feeling better with less pain.  Review of Systems: Pertinent items are noted in HPI.  Past Medical History  Diagnosis Date  . Myocardial infarction 2000  . Diabetes mellitus type 2, insulin dependent   . COPD (chronic obstructive pulmonary disease)   . Hyperlipidemia   . OA (osteoarthritis) of knee   . Hypertension   . Coronary artery disease   . Asthma   . Elevated LFTs   . ETOH abuse   . Tobacco abuse   . Osteoarthritis   . Breast cancer     Social History  Substance Use Topics  . Smoking status: Current Every Day Smoker -- 0.75 packs/day for 15 years    Types: Cigarettes  . Smokeless tobacco: Never Used  . Alcohol Use: No    Family History  Problem Relation Age of Onset  . Diabetes Mother   . Hypertension  Father   . Heart disease Father   . Heart attack Father   . Stroke Father    Allergies  Allergen Reactions  . Zithromax [Azithromycin Dihydrate]     ORAL ULCERS   . Penicillins Rash    OBJECTIVE: Filed Vitals:   05/04/15 1100 05/04/15 1113 05/04/15 1200 05/04/15 1310  BP: 128/59   141/52  Pulse: 88 75  60  Temp:   96.4 F (35.8 C) 98 F (36.7 C)  TempSrc:    Oral  Resp: 20 19  18   Height:      Weight:      SpO2: 94% 100%  100%   Body mass index is 22.53 kg/(m^2).  General: She is in no distress. Soft cervical collar in place Abdomen: G-tube placed today  Lab Results Lab Results  Component Value Date   WBC 10.8* 05/04/2015   HGB 10.9* 05/04/2015   HCT 32.9* 05/04/2015   MCV 81.6 05/04/2015   PLT 313 05/04/2015    Lab Results  Component Value Date   CREATININE 0.38* 05/04/2015   BUN 6 05/04/2015   NA 130* 05/04/2015   K 3.7 05/04/2015   CL 98* 05/04/2015   CO2 23 05/04/2015  Lab Results  Component Value Date   ALT 10* 04/28/2015   AST 14* 04/28/2015   ALKPHOS 111 04/28/2015   BILITOT 0.9 04/28/2015     Microbiology: Recent Results (from the past 240 hour(s))  Blood Culture (routine x 2)     Status: None   Collection Time: 04/26/15  5:27 PM  Result Value Ref Range Status   Specimen Description BLOOD LEFT FOREARM  Final   Special Requests BOTTLES DRAWN AEROBIC AND ANAEROBIC 5CC  Final   Culture   Final    NO GROWTH 5 DAYS Performed at [facility]    Report Status 05/01/2015 FINAL  Final  Blood Culture (routine x 2)     Status: None   Collection Time: 04/26/15  5:29 PM  Result Value Ref Range Status   Specimen Description BLOOD LEFT ANTECUBITAL  Final   Special Requests BOTTLES DRAWN AEROBIC AND ANAEROBIC 5CC  Final   Culture   Final    NO GROWTH 5 DAYS Performed at [facility]    Report Status 05/01/2015 FINAL  Final  Urine culture     Status: None   Collection Time: 04/26/15  5:48 PM  Result Value Ref Range Status    Specimen Description URINE, CLEAN CATCH  Final   Special Requests NONE  Final   Culture   Final    NO GROWTH 2 DAYS Performed at [facility]    Report Status 04/28/2015 FINAL  Final  MRSA PCR Screening     Status: None   Collection Time: 04/27/15 12:25 AM  Result Value Ref Range Status   MRSA by PCR NEGATIVE NEGATIVE Final    Comment:        The GeneXpert MRSA Assay (FDA approved for NASAL specimens only), is one component of a comprehensive MRSA colonization surveillance program. It is not intended to diagnose MRSA infection nor to guide or monitor treatment for MRSA infections.   C difficile quick scan w PCR reflex     Status: None   Collection Time: 04/28/15  3:10 AM  Result Value Ref Range Status   C Diff antigen NEGATIVE NEGATIVE Final   C Diff toxin NEGATIVE NEGATIVE Final   C Diff interpretation Negative for toxigenic C. difficile  Final  Surgical pcr screen     Status: None   Collection Time: 05/03/15  5:17 AM  Result Value Ref Range Status   MRSA, PCR NEGATIVE NEGATIVE Final   Staphylococcus aureus NEGATIVE NEGATIVE Final    Comment:        The Xpert SA Assay (FDA approved for NASAL specimens in patients over 48 years of age), is one component of a comprehensive surveillance program.  Test performance has been validated by [facility] for patients greater than or equal to 73 year old. It is not intended to diagnose infection nor to guide or monitor treatment.   Anaerobic culture     Status: None (Preliminary result)   Collection Time: 05/03/15  8:36 AM  Result Value Ref Range Status   Specimen Description ABSCESS  Final   Special Requests SPEC A RETAINED HARDWARE AND CERVICAL ABSCESS  Final   Gram Stain PENDING  Incomplete   Culture   Final    NO ANAEROBES ISOLATED; CULTURE IN PROGRESS FOR 5 DAYS Performed at [facility]    Report Status PENDING  Incomplete  Gram stain     Status: None   Collection Time: 05/03/15  8:36 AM  Result  Value Ref  Range Status   Specimen Description ABSCESS  Final   Special Requests SPEC A RETAINED HARDWARE AND CERVICAL ABSCESS  Final   Gram Stain   Final    FEW WBC PRESENT,BOTH PMN AND MONONUCLEAR NO ORGANISMS SEEN    Report Status 05/03/2015 FINAL  Final  Culture, routine-abscess     Status: None (Preliminary result)   Collection Time: 05/03/15  8:36 AM  Result Value Ref Range Status   Specimen Description ABSCESS  Final   Special Requests SPEC A RETAINED HARDWARE AND CERVICAL ABCESS  Final   Gram Stain   Final    RARE WBC PRESENT,BOTH PMN AND MONONUCLEAR NO SQUAMOUS EPITHELIAL CELLS SEEN NO ORGANISMS SEEN Performed at [facility]    Culture   Final    NO GROWTH 1 DAY Performed at [facility]    Report Status PENDING  Incomplete  Anaerobic culture     Status: None (Preliminary result)   Collection Time: 05/03/15  8:42 AM  Result Value Ref Range Status   Specimen Description ABSCESS  Final   Special Requests SPEC B NECK ABCESS C3  Final   Gram Stain   Final    NO WBC SEEN NO SQUAMOUS EPITHELIAL CELLS SEEN NO ORGANISMS SEEN Performed at [facility]    Culture   Final    NO ANAEROBES ISOLATED; CULTURE IN PROGRESS FOR 5 DAYS Performed at [facility]    Report Status PENDING  Incomplete  Gram stain     Status: None   Collection Time: 05/03/15  8:42 AM  Result Value Ref Range Status   Specimen Description ABSCESS  Final   Special Requests SPEC B NECK ABCESS C3  Final   Gram Stain   Final    FEW WBC PRESENT,BOTH PMN AND MONONUCLEAR NO ORGANISMS SEEN    Report Status 05/03/2015 FINAL  Final  Culture, routine-abscess     Status: None (Preliminary result)   Collection Time: 05/03/15  8:42 AM  Result Value Ref Range Status   Specimen Description ABSCESS  Final   Special Requests SPEC B NECK ABCESS C3  Final   Gram Stain   Final    NO WBC SEEN NO SQUAMOUS EPITHELIAL CELLS SEEN NO ORGANISMS SEEN Performed at [facility]     Culture   Final    NO GROWTH 1 DAY Performed at [facility]    Report Status PENDING  Incomplete  Gram stain     Status: None   Collection Time: 05/03/15  9:04 AM  Result Value Ref Range Status   Specimen Description TISSUE  Final   Special Requests PHLEGMA FROM BEHIND EXPLATED PLATE  Final   Gram Stain   Final    FEW WBC PRESENT,BOTH PMN AND MONONUCLEAR NO ORGANISMS SEEN    Report Status 05/03/2015 FINAL  Final  Anaerobic culture     Status: None (Preliminary result)   Collection Time: 05/03/15  9:05 AM  Result Value Ref Range Status   Specimen Description TISSUE  Final   Special Requests PHLEGMA FROM BEHIND EXPLATED PLATE  Final   Gram Stain PENDING  Incomplete   Culture   Final    NO ANAEROBES ISOLATED; CULTURE IN PROGRESS FOR 5 DAYS Performed at [facility]    Report Status PENDING  Incomplete  Tissue culture     Status: None (Preliminary result)   Collection Time: 05/03/15  9:05 AM  Result Value Ref Range Status   Specimen Description TISSUE  Final  Special Requests PHLEGMA FROM BEHIND EXPLATED PLATE  Final   Gram Stain   Final    NO WBC SEEN NO ORGANISMS SEEN Performed at [facility]    Culture   Final    NO GROWTH 1 DAY Performed at [facility]    Report Status PENDING  Incomplete     ASSESSMENT: She has cervical spine infection related to her esophageal perforation. Operative cultures are negative at 24 hours and may remain falsely negative due to prior antibiotics therapy.  PLAN: 1. Continue vancomycin and meropenem pending final culture results 2. PICC placement ordered 3. I will arrange follow-up in my clinic 4. Please call me for any infectious disease questions this weekend  Cliffton Asters, MD [facility] for Infectious Disease [facility] [phone] pager   [phone] cell 05/04/2015, 4:42 PM

## 2015-05-04 NOTE — Progress Notes (Signed)
Peripherally Inserted Central Catheter/Midline Placement  The IV Nurse has discussed with the patient and/or persons authorized to consent for the patient, the purpose of this procedure and the potential benefits and risks involved with this procedure.  The benefits include less needle sticks, lab draws from the catheter and patient may be discharged home with the catheter.  Risks include, but not limited to, infection, bleeding, blood clot (thrombus formation), and puncture of an artery; nerve damage and irregular heat beat.  Alternatives to this procedure were also discussed.  PICC/Midline Placement Documentation  PICC / Midline Single Lumen 05/04/15 PICC Left Brachial 40 cm 0 cm (Active)  Indication for Insertion or Continuance of Line Home intravenous therapies (PICC only) 05/04/2015  6:15 PM  Exposed Catheter (cm) 0 cm 05/04/2015  6:15 PM  Site Assessment Clean;Dry;Intact 05/04/2015  6:15 PM  Line Status Flushed;Saline locked;Blood return noted 05/04/2015  6:15 PM  Dressing Type Transparent 05/04/2015  6:15 PM  Dressing Status Clean;Dry;Intact 05/04/2015  6:15 PM  Dressing Change Due 05/11/15 05/04/2015  6:15 PM       [facility] Payment 05/04/2015, 6:17 PM

## 2015-05-04 NOTE — Progress Notes (Signed)
Called report to Short stay

## 2015-05-04 NOTE — Progress Notes (Signed)
[facility]  Patient Status: New pt for [facility] this readmission    [facility] is providing the following services: [facility],. PT/OT, [facility] for home IV ABX.  [facility] hospital infusion coordinator called and spoke with husband to review POC for home IV ABX to ensure pt's husband is willign and able caregiver. Husband engaged by phone during [place] review and feels he will be able to support the IV ABX at home as well as enteral feedings with education.  [facility] is prepared to take [name] home when ordered by MD.   [facility] pharmacy will need IV ABX orders at DC to provide for home.    If patient discharges after hours, please call [phone].   [name] 05/04/2015, 6:08 PM

## 2015-05-04 NOTE — Progress Notes (Signed)
Patient ID: [name], female   DOB: 1945/11/12, 69 y.o.   MRN: [id number]  Patient will have open placement of gastrostomy tube this morning.  She has been NPO.  Consent is signed.  She is on therapeutic antibiotics.  [name]. [name], MD, [facility] Surgery  General/ Trauma Surgery  05/04/2015 8:04 AM

## 2015-05-04 NOTE — Op Note (Signed)
Preop diagnosis: Esophageal fistula; need for feeding access Postop diagnosis: Same Procedure performed: Open gastrostomy tube placement Surgeon:Rakeem Colley K. Anesthesia: Gen. endotracheal Indications: This is a 69 year old female who has had multiple medical surgical issues who recently was discovered to have a small esophageal fistula from infected cervical hardware. The hardware was removed yesterday. We are asked to place a feeding tube to allow access for feeding for the next several months while this esophageal fistula heals.  Description of procedure: The patient brought to the operating room and placed in a supine position on the operating room table. After an adequate level of general anesthesia was obtained, the patient's abdomen was prepped with ChloraPrep and draped sterile fashion. Timeout was taken to ensure the proper patient proper procedure. We made a three-inch vertical midline incision. Dissection was carried down to the fascia. When the peritoneal cavity sharply. The stomach was immediately visualized. This was quite mobile. We selected a point on anterior wall of the mid portion of the stomach. We placed 2 concentric 3-0 silk pressure and sutures. We made a small cruciate incision in the left upper quadrant of the skin and brought in an 18 [place] feeding tube. The gastrotomy was opened and we inserted the feeding tube. The balloon was inflated. We tied down the 2 pursestring sutures. The stomach was pulled up to the abdominal wall was reinforced with 2 interrupted 3-0 silk sutures. The plastic flange of the tube was pulled down to create mild tension. We irrigated the gastrostomy to and was confirmed to be within the stomach. We then reapproximated the fascia with multiple interrupted 0 Novafil sutures. Staples were used to close the skin. The feeding tube was placed to straight drain on a Foley bag. Patient was then asked patent brought to recovery was stable condition. All sponge,  initially, and needle counts are correct.  [name]. [name], MD, [facility] Surgery  General/ Trauma Surgery  05/04/2015 10:59 AM

## 2015-05-05 DIAGNOSIS — F17200 Nicotine dependence, unspecified, uncomplicated: Secondary | ICD-10-CM

## 2015-05-05 DIAGNOSIS — E876 Hypokalemia: Secondary | ICD-10-CM

## 2015-05-05 LAB — CBC
HEMATOCRIT: 33.8 % — AB (ref 36.0–46.0)
HEMOGLOBIN: 11.1 g/dL — AB (ref 12.0–15.0)
MCH: 26.7 pg (ref 26.0–34.0)
MCHC: 32.8 g/dL (ref 30.0–36.0)
MCV: 81.4 fL (ref 78.0–100.0)
Platelets: 362 10*3/uL (ref 150–400)
RBC: 4.15 MIL/uL (ref 3.87–5.11)
RDW: 15.4 % (ref 11.5–15.5)
WBC: 9.7 10*3/uL (ref 4.0–10.5)

## 2015-05-05 LAB — BASIC METABOLIC PANEL
Anion gap: 8 (ref 5–15)
CHLORIDE: 96 mmol/L — AB (ref 101–111)
CO2: 28 mmol/L (ref 22–32)
CREATININE: 0.38 mg/dL — AB (ref 0.44–1.00)
Calcium: 8.2 mg/dL — ABNORMAL LOW (ref 8.9–10.3)
GFR calc Af Amer: >60 mL/min (ref >60)
GFR calc non Af Amer: >60 mL/min (ref >60)
Glucose, Bld: 122 mg/dL — ABNORMAL HIGH (ref 65–99)
POTASSIUM: 3.1 mmol/L — AB (ref 3.5–5.1)
SODIUM: 132 mmol/L — AB (ref 135–145)

## 2015-05-05 LAB — MAGNESIUM: MAGNESIUM: 1.8 mg/dL (ref 1.7–2.4)

## 2015-05-05 LAB — GLUCOSE, CAPILLARY
GLUCOSE-CAPILLARY: 150 mg/dL — AB (ref 65–99)
GLUCOSE-CAPILLARY: 119 mg/dL — AB (ref 65–99)
GLUCOSE-CAPILLARY: 152 mg/dL — AB (ref 65–99)
GLUCOSE-CAPILLARY: 151 mg/dL — AB (ref 65–99)
Glucose-Capillary: 125 mg/dL — ABNORMAL HIGH (ref 65–99)
Glucose-Capillary: 134 mg/dL — ABNORMAL HIGH (ref 65–99)

## 2015-05-05 MED ORDER — JEVITY 1.2 CAL PO LIQD
1000.0000 mL | ORAL | Status: DC
  Filled 2015-05-05: qty 1000

## 2015-05-05 MED ORDER — HYDRALAZINE HCL 20 MG/ML IJ SOLN: 10.0000 mg | Freq: Four times a day (QID) | INTRAMUSCULAR | Status: DC | PRN

## 2015-05-05 MED ORDER — POTASSIUM CHLORIDE 20 MEQ/15ML (10%) PO SOLN
40.0000 meq | Freq: Once | ORAL | Status: AC
  Administered 2015-05-05: 40 meq
  Filled 2015-05-05: qty 30

## 2015-05-05 MED ORDER — LOSARTAN POTASSIUM 25 MG PO TABS
25.0000 mg | ORAL_TABLET | Freq: Every day | ORAL | Status: DC
  Administered 2015-05-05 – 2015-05-06 (×2): 25 mg
  Filled 2015-05-05 (×2): qty 1

## 2015-05-05 MED ORDER — OXYCODONE HCL 5 MG/5ML PO SOLN: 5.0000 mg | ORAL | Status: DC | PRN

## 2015-05-05 MED ORDER — OXYCODONE HCL 5 MG/5ML PO SOLN
15.0000 mg | ORAL | Status: DC | PRN
  Administered 2015-05-05 – 2015-05-06 (×6): 15 mg
  Filled 2015-05-05 (×6): qty 15

## 2015-05-05 MED ORDER — ASPIRIN 81 MG PO CHEW
81.0000 mg | CHEWABLE_TABLET | Freq: Every day | ORAL | Status: DC
  Administered 2015-05-05 – 2015-05-06 (×2): 81 mg
  Filled 2015-05-05 (×2): qty 1

## 2015-05-05 MED ORDER — VITAL HIGH PROTEIN PO LIQD: 1000.0000 mL | ORAL | Status: DC

## 2015-05-05 MED ORDER — POTASSIUM CHLORIDE 10 MEQ/100ML IV SOLN
10.0000 meq | Freq: Once | INTRAVENOUS | Status: AC
  Administered 2015-05-05: 10 meq via INTRAVENOUS
  Filled 2015-05-05: qty 100

## 2015-05-05 MED ORDER — JEVITY 1.2 CAL PO LIQD
1000.0000 mL | ORAL | Status: DC
  Administered 2015-05-05: 1000 mL
  Filled 2015-05-05 (×2): qty 1000

## 2015-05-05 NOTE — Care Management Note (Signed)
Case Management Note  Patient Details  Name: [name] MRN: [id number] Date of Birth: 08-19-1946  Subjective/Objective:                   Open gastrostomy tube placement/R hip dislocation/osteomylitis Action/Plan:  Discharge planning Expected Discharge Date:   (unknown)               Expected Discharge Plan:  [place]  In-House Referral:  NA  Discharge planning Services  CM Consult  Post Acute Care Choice:  Home Health Choice offered to:  Patient  DME Arranged:  IV pump/equipment DME Agency:  [place] Arranged:  PT, RN [facility] Agency:  [facility]  Status of Service:  In process, will continue to follow  Medicare Important Message Given:  Yes-second notification given Date Medicare IM Given:    Medicare IM give by:    Date Additional Medicare IM Given:    Additional Medicare Important Message give by:     If discussed at [place] of Stay Meetings, dates discussed:    Additional Comments: CM has requested HHRN/PT orders with Face to Face, tube feeding orders and IV ABX orders to arrange for [facility] services.  Will continue to monitor. Dellie Catholic, RN 05/05/2015, 10:33 AM

## 2015-05-05 NOTE — Progress Notes (Signed)
Pt refuse to wear SCD and C-collar, education gave to patient and she did not want to change her desicion.

## 2015-05-05 NOTE — Progress Notes (Signed)
D/c Planning: 1. Start tube feeds today and liquid pain meds. If tolerating then plan on d/c Sunday 2. Spoke with Dr. Megan Salon - plan on continuing current IV Abx regimen.  Will make adjustments depending on  cx's 3. Will f/u with me in 1 week for wound check 4. Completed face-face for necessary HHS

## 2015-05-05 NOTE — Progress Notes (Signed)
Patient ID: [name], female   DOB: 1945-10-22, 69 y.o.   MRN: [id number] Subjective: POD # 2 from I&D neck abscess removal of deep implants, POD # 1 from G-tube placement     Patient reports pain as mild but just uncomfortable, low back pain.  Ready to go home.  No major complaints  Objective:   VITALS:   Filed Vitals:   05/05/15 0518  BP: 165/76  Pulse: 72  Temp: 97.3 F (36.3 C)  Resp: 16    Neurovascular intact Incision: dressing C/D/I   Due to no drainage from cervical drains, I removed them this am and redressed wound  LABS  Recent Labs  05/04/15 0410 05/05/15 0500  HGB 10.9* 11.1*  HCT 32.9* 33.8*  WBC 10.8* 9.7  PLT 313 362     Recent Labs  05/04/15 0410 05/05/15 0500  NA 130* 132*  K 3.7 3.1*  BUN 6 <5*  CREATININE 0.38* 0.38*  GLUCOSE 112* 122*    No results for input(s): LABPT, INR in the last 72 hours.   Assessment/Plan: POD # 2 from I&D neck abscess removal of deep implants, POD # 1 from G-tube placement   Discharge home with home health possibly today Have discussed with Dr. Rolena Infante re drain and he has indicated that he may be by to check on her this am prior to discharge  Needs instruction on tube feeding and home IV antibiotic instruction  Follow up plans per Dr. Rolena Infante Wound care and C-collar care per [facility]

## 2015-05-05 NOTE — Evaluation (Signed)
Occupational Therapy Evaluation Patient Details Name: [name] MRN: [id number] DOB: 1945/12/24 Today's Date: 05/05/2015    History of Present Illness OPEN GASTROSTOMY WITH TUBE PLACEMENT. Recent prosthetic hip joint dislocation, discitis C3-4, abcess C2-T11, closed reduction per Dr Shon Baton 04/26/15.   Clinical Impression   Pt with decline in function and safety with ADLs and ADL mobility with decreased strength, balance and endurance. Pt would benefit from acute OT services to address impairments to increase level of function and safety    Follow Up Recommendations  Home health OT;Supervision/Assistance - 24 hour    Equipment Recommendations  None recommended by OT    Recommendations for Other Services       Precautions / Restrictions Precautions Precautions: Back;Posterior Hip;Cervical Precaution Comments: pt unable to recall all hip, cervical, back precutions. Reviewed all precautions with pt Required Braces or Orthoses: Knee Immobilizer - Right Knee Immobilizer - Right: On at all times Restrictions RLE Weight Bearing: Weight bearing as tolerated      Mobility Bed Mobility Overal bed mobility: Needs Assistance Bed Mobility: Supine to Sit;Sit to Supine     Supine to sit: Min assist Sit to supine: Min assist   General bed mobility comments: cues for THPs. Min A with LEs  Transfers Overall transfer level: Needs assistance Equipment used: Rolling walker (2 wheeled) Transfers: Sit to/from Stand           General transfer comment: cues for THPs and hand placement. cues for correct hand placement, speed of pace    Balance Overall balance assessment: Needs assistance Sitting-balance support: No upper extremity supported;Feet supported Sitting balance-Leahy Scale: Good     Standing balance support: During functional activity;Bilateral upper extremity supported Standing balance-Leahy Scale: Fair                              ADL        Grooming: Wash/dry hands;Set up;Supervision/safety;Standing   Upper Body Bathing: Sitting;Set up;Supervision/ safety   Lower Body Bathing: Maximal assistance;Sit to/from stand   Upper Body Dressing : Minimal assistance;Sitting   Lower Body Dressing: Total assistance;Sit to/from stand   Toilet Transfer: Ambulation;RW;Min guard;Cueing for Chief of Staff Details (indicate cue type and reason): pt impulsive, requires cues for correct hand placement and safe technique using RW Toileting- Clothing Manipulation and Hygiene: Sit to/from stand;Min guard               Vision  wears glasses, no change from baseline   Perception Perception Perception Tested?: No   Praxis Praxis Praxis tested?: Not tested    Pertinent Vitals/Pain Pain Score: 6  Pain Location: back, R hip Pain Descriptors / Indicators: Aching;Sore Pain Intervention(s): Repositioned;Monitored during session     Hand Dominance  R handed   Extremity/Trunk Assessment Upper Extremity Assessment Upper Extremity Assessment: Overall WFL for tasks assessed   Lower Extremity Assessment Lower Extremity Assessment: Defer to PT evaluation   Cervical / Trunk Assessment Cervical / Trunk Assessment: Normal   Communication Communication Communication: No difficulties   Cognition Arousal/Alertness: Awake/alert Behavior During Therapy: WFL for tasks assessed/performed Overall Cognitive Status: Within Functional Limits for tasks assessed                     General Comments   Requires encouragement                 Home Living Family/patient expects to be discharged to:: Private residence Living Arrangements: Spouse/significant other  Type of Home: House Home Access: Stairs to enter     Home Layout: One level     Bathroom Shower/Tub: Producer, television/film/video: Standard     Home Equipment: Environmental consultant - 2 wheels;Bedside commode;Shower seat - built Associate Professor: Long-handled sponge;Reacher        Prior Functioning/Environment Level of Independence: Needs assistance    ADL's / Homemaking Assistance Needed: assist with cooking, laundry        OT Diagnosis: Generalized weakness   OT Problem List: Decreased strength;Decreased knowledge of use of DME or AE   OT Treatment/Interventions: Self-care/ADL training;Patient/family education;Therapeutic activities;DME and/or AE instruction    OT Goals(Current goals can be found in the care plan section) Acute Rehab OT Goals Patient Stated Goal: Home OT Goal Formulation: With patient Time For Goal Achievement: 05/12/15 Potential to Achieve Goals: Good ADL Goals Pt Will Perform Lower Body Bathing: with min assist;with adaptive equipment Pt Will Perform Lower Body Dressing: with min assist;with caregiver independent in assisting;with adaptive equipment Pt Will Transfer to Toilet: with min guard assist;with supervision;ambulating;regular height toilet Pt Will Perform Toileting - Clothing Manipulation and hygiene: with min guard assist;with supervision;sit to/from stand  OT Frequency: Min 2X/week   Barriers to D/C: Other (comment)  pt states that her husbanc and son will provide 24 hour sup and assist. Dr Shon Baton in to see pt duirn session and states that she may be able to d/c home this afternoon                    End of Session Equipment Utilized During Treatment: Rolling walker;Right knee immobilizer;Cervical collar  Activity Tolerance: Patient tolerated treatment well Patient left: in bed;with call bell/phone within reach   Time: 1121-1146 OT Time Calculation (min): 25 min Charges:  OT General Charges $OT Re-eval: 1 Procedure OT Treatments $Therapeutic Activity: 8-22 mins G-Codes:    Galen Manila 05/05/2015, 12:11 PM

## 2015-05-05 NOTE — Progress Notes (Signed)
1 Day Post-Op  Subjective: Patient is eager to go home, but she is still using IV pain medication along with her Fentanyl patch.  She has not yet begun tube feeds via G-tube.   The cervical drains were removed by Ortho BM yesterday   Objective: Vital signs in last 24 hours: Temp:  [96.4 F (35.8 C)-98.5 F (36.9 C)] 97.3 F (36.3 C) (08/20 0518) Pulse Rate:  [60-88] 72 (08/20 0518) Resp:  [16-20] 16 (08/20 0518) BP: (128-179)/(52-76) 165/76 mmHg (08/20 0518) SpO2:  [94 %-100 %] 97 % (08/20 0518) Last BM Date: 05/04/15  Intake/Output from previous day: 08/19 0701 - 08/20 0700 In: 500 [I.V.:500] Out: 555 [Urine:250; Drains:305] Intake/Output this shift: Total I/O In: 0  Out: 200 [Urine:200]  General appearance: alert, cooperative and no distress GI: soft, incisional tenderness; g-tube site OK with greenish drainage in the bag Incision c/d/i  Lab Results:   Recent Labs  05/04/15 0410 05/05/15 0500  WBC 10.8* 9.7  HGB 10.9* 11.1*  HCT 32.9* 33.8*  PLT 313 362   BMET  Recent Labs  05/04/15 0410 05/05/15 0500  NA 130* 132*  K 3.7 3.1*  CL 98* 96*  CO2 23 28  GLUCOSE 112* 122*  BUN 6 <5*  CREATININE 0.38* 0.38*  CALCIUM 8.3* 8.2*   PT/INR No results for input(s): LABPROT, INR in the last 72 hours. ABG No results for input(s): PHART, HCO3 in the last 72 hours.  Invalid input(s): PCO2, PO2  Studies/Results: No results found.  Anti-infectives: Anti-infectives    Start     Dose/Rate Route Frequency Ordered Stop   05/03/15 1700  vancomycin (VANCOCIN) IVPB 750 mg/150 ml premix     750 mg 150 mL/hr over 60 Minutes Intravenous Every 8 hours 05/03/15 1142     05/03/15 1400  meropenem (MERREM) 1 g in sodium chloride 0.9 % 100 mL IVPB     1 g 200 mL/hr over 30 Minutes Intravenous Every 8 hours 05/03/15 1241     05/03/15 0745  vancomycin (VANCOCIN) IVPB 1000 mg/200 mL premix     1,000 mg 200 mL/hr over 60 Minutes Intravenous To Surgery 05/03/15 0740  05/03/15 1005   04/29/15 0200  vancomycin (VANCOCIN) IVPB 750 mg/150 ml premix  Status:  Discontinued     750 mg 150 mL/hr over 60 Minutes Intravenous Every 8 hours 04/28/15 1816 05/01/15 1217   04/28/15 1900  vancomycin (VANCOCIN) 500 mg in sodium chloride 0.9 % 100 mL IVPB     500 mg 100 mL/hr over 60 Minutes Intravenous  Once 04/28/15 1815 04/28/15 1940   04/28/15 1400  meropenem (MERREM) 1 g in sodium chloride 0.9 % 100 mL IVPB  Status:  Discontinued     1 g 200 mL/hr over 30 Minutes Intravenous Every 8 hours 04/28/15 1312 05/01/15 1217   04/27/15 1400  ciprofloxacin (CIPRO) IVPB 400 mg  Status:  Discontinued     400 mg 200 mL/hr over 60 Minutes Intravenous Every 12 hours 04/27/15 0528 04/28/15 1302   04/27/15 0600  vancomycin (VANCOCIN) 500 mg in sodium chloride 0.9 % 100 mL IVPB  Status:  Discontinued     500 mg 100 mL/hr over 60 Minutes Intravenous Every 12 hours 04/26/15 1827 04/26/15 2358   04/27/15 0600  aztreonam (AZACTAM) 1 g in dextrose 5 % 50 mL IVPB  Status:  Discontinued     1 g 100 mL/hr over 30 Minutes Intravenous 3 times per day 04/26/15 1827 04/26/15 2358   04/27/15 0600  vancomycin (VANCOCIN) 500 mg in sodium chloride 0.9 % 100 mL IVPB  Status:  Discontinued     500 mg 100 mL/hr over 60 Minutes Intravenous Every 12 hours 04/27/15 0026 04/28/15 1815   04/27/15 0200  aztreonam (AZACTAM) 2 g in dextrose 5 % 50 mL IVPB  Status:  Discontinued     2 g 100 mL/hr over 30 Minutes Intravenous Every 8 hours 04/26/15 2358 04/28/15 1302   04/27/15 0000  ciprofloxacin (CIPRO) IVPB 200 mg  Status:  Discontinued     200 mg 100 mL/hr over 60 Minutes Intravenous Every 12 hours 04/26/15 2358 04/27/15 0528   04/26/15 1715  aztreonam (AZACTAM) 2 g in dextrose 5 % 50 mL IVPB     2 g 100 mL/hr over 30 Minutes Intravenous  Once 04/26/15 1711 04/26/15 1838   04/26/15 1715  vancomycin (VANCOCIN) IVPB 1000 mg/200 mL premix     1,000 mg 200 mL/hr over 60 Minutes Intravenous  Once 04/26/15  1711 04/26/15 1838      Assessment/Plan: s/p Procedure(s): OPEN GASTROSTOMY WITH TUBE PLACEMENT (N/A) May initiate tube feeds today.  Tube feed rate/ schedule to be determined by nutrition and primary team  Staples need to be removed in 12-14 days.  This can be done by [facility] or in my office.  She should call for an appointment. Pain regimen to be determined by primary team.  She has fentanyl patch but consider liquid pain medicine via G-tube PRN.   LOS: 9 days    Sally Reimers K. 05/05/2015

## 2015-05-05 NOTE — Progress Notes (Signed)
Physical Therapy Treatment Patient Details Name: [name] MRN: [id number] DOB: 1946-07-15 Today's Date: 05/05/2015    History of Present Illness Open gastrostomy w/ tube placement. Recent prosthetic hip joint dislocation, discitis C3-4, abcess C2-T11, closed reduction per Dr Shon Baton 04/26/15.  past medical history of CAD, status post MI, insulin requiring type 2 diabetes, COPD, tobacco abuse disorder, EtOH abuse, hypertension, hyperlipidemia, history of breast cancer     PT Comments    Ms. Bernabei was agitated this session about not being able to go home tomorrow but was willing to work w/ therapy and ambulated in room.  Requested not to leave room as she was tired.  Pt recalled 3/3 hip precautions.  Pt will benefit from continued skilled PT services to increase functional independence and safety.   Follow Up Recommendations  Home health PT     Equipment Recommendations  None recommended by PT    Recommendations for Other Services       Precautions / Restrictions Precautions Precautions: Back;Posterior Hip;Cervical Precaution Comments: pt unable to recall all hip, cervical, back  Required Braces or Orthoses: Knee Immobilizer - Right Knee Immobilizer - Right: On at all times (in effort to prevent future hip dislocation) Restrictions Weight Bearing Restrictions: Yes RLE Weight Bearing: Weight bearing as tolerated    Mobility  Bed Mobility Overal bed mobility: Modified Independent Bed Mobility: Supine to Sit     Supine to sit: Modified independent (Device/Increase time);HOB elevated Sit to supine: Min assist   General bed mobility comments: Education on log roll technique to protect cervical spine and g tube placement.  Pt verbalized understanding.  Transfers Overall transfer level: Needs assistance Equipment used: Rolling walker (2 wheeled) Transfers: Sit to/from Stand Sit to Stand: Min guard         General transfer comment: Pt eager to "get therapy done" and  requires cues to slow down.    Ambulation/Gait Ambulation/Gait assistance: Min guard Ambulation Distance (Feet): 40 Feet Assistive device: Rolling walker (2 wheeled) Gait Pattern/deviations: Step-through pattern;Antalgic;Trunk flexed   Gait velocity interpretation: Below normal speed for age/gender General Gait Details: Pt requires cues to keep RW only 1 ft in front of her as she has tendency to push it too far away, leading to trunk flexion.     Stairs Stairs:  (Says she learned technique previously, no need to review)          Wheelchair Mobility    Modified Rankin (Stroke Patients Only)       Balance Overall balance assessment: Needs assistance Sitting-balance support: No upper extremity supported;Feet supported Sitting balance-Leahy Scale: Good     Standing balance support: During functional activity;Bilateral upper extremity supported Standing balance-Leahy Scale: Fair                      Cognition Arousal/Alertness: Awake/alert Behavior During Therapy: Agitated;Impulsive ("I'm not upset w/ you, I'm just tired") Overall Cognitive Status: Within Functional Limits for tasks assessed                      Exercises Total Joint Exercises Ankle Circles/Pumps: AROM;Both;15 reps;Seated Quad Sets: Strengthening;Both;10 reps;Seated Hip ABduction/ADduction: AROM;Right;5 reps;Seated Long Arc Quad: AROM;Left;5 reps;Seated    General Comments        Pertinent Vitals/Pain Pain Assessment: 0-10 Pain Score: 6  Pain Location: back and g tube insertion site Pain Descriptors / Indicators: Aching Pain Intervention(s): Limited activity within patient's tolerance;Monitored during session;Repositioned    Home Living Family/patient expects to  be discharged to:: Private residence Living Arrangements: Spouse/significant other   Type of Home: House Home Access: Stairs to enter   Home Layout: One level Home Equipment: Environmental consultant - 2 wheels;Bedside  commode;Shower seat - built Scientist, clinical (histocompatibility and immunogenetics)      Prior Function Level of Independence: Needs assistance    ADL's / Homemaking Assistance Needed: assist with cooking, laundry     PT Goals (current goals can now be found in the care plan section) Acute Rehab PT Goals Patient Stated Goal: to go home Progress towards PT goals: Progressing toward goals    Frequency  Min 3X/week    PT Plan Current plan remains appropriate    Co-evaluation             End of Session Equipment Utilized During Treatment: Right knee immobilizer Activity Tolerance: Treatment limited secondary to agitation Patient left: in chair;with call bell/phone within reach     Time: 1153-1212 PT Time Calculation (min) (ACUTE ONLY): 19 min  Charges:  $Gait Training: 8-22 mins                    G Codes:      Michail Jewels PT, [place] [phone] Pager: [phone] 05/05/2015, 12:50 PM

## 2015-05-05 NOTE — Progress Notes (Signed)
ANTIBIOTIC CONSULT NOTE - FOLLOW UP  Pharmacy Consult for vancomycin and meropenem Indication: cervical diskitis and osteomyelitis  Allergies  Allergen Reactions  . Zithromax [Azithromycin Dihydrate]     ORAL ULCERS   . Penicillins Rash    Patient Measurements: Height: 5\' 2"  (157.5 cm) Weight: 123 lb 3.8 oz (55.9 kg) IBW/kg (Calculated) : 50.1  Vital Signs: Temp: 97.3 F (36.3 C) (08/20 0518) Temp Source: Oral (08/20 0518) BP: 165/76 mmHg (08/20 0518) Pulse Rate: 72 (08/20 0518) Intake/Output from previous day: 08/19 0701 - 08/20 0700 In: 500 [I.V.:500] Out: 555 [Urine:250; Drains:305] Intake/Output from this shift:    Labs:  Recent Labs  05/04/15 0410 05/05/15 0500  WBC 10.8* 9.7  HGB 10.9* 11.1*  PLT 313 362  CREATININE 0.38* 0.38*   Estimated Creatinine Clearance: 54 mL/min (by C-G formula based on Cr of 0.38). No results for input(s): VANCOTROUGH, VANCOPEAK, VANCORANDOM, GENTTROUGH, GENTPEAK, GENTRANDOM, TOBRATROUGH, TOBRAPEAK, TOBRARND, AMIKACINPEAK, AMIKACINTROU, AMIKACIN in the last 72 hours.   Microbiology: Recent Results (from the past 720 hour(s))  Blood Culture (routine x 2)     Status: None   Collection Time: 04/26/15  5:27 PM  Result Value Ref Range Status   Specimen Description BLOOD LEFT FOREARM  Final   Special Requests BOTTLES DRAWN AEROBIC AND ANAEROBIC 5CC  Final   Culture   Final    NO GROWTH 5 DAYS Performed at [facility]    Report Status 05/01/2015 FINAL  Final  Blood Culture (routine x 2)     Status: None   Collection Time: 04/26/15  5:29 PM  Result Value Ref Range Status   Specimen Description BLOOD LEFT ANTECUBITAL  Final   Special Requests BOTTLES DRAWN AEROBIC AND ANAEROBIC 5CC  Final   Culture   Final    NO GROWTH 5 DAYS Performed at [facility]    Report Status 05/01/2015 FINAL  Final  Urine culture     Status: None   Collection Time: 04/26/15  5:48 PM  Result Value Ref Range Status   Specimen  Description URINE, CLEAN CATCH  Final   Special Requests NONE  Final   Culture   Final    NO GROWTH 2 DAYS Performed at [facility]    Report Status 04/28/2015 FINAL  Final  MRSA PCR Screening     Status: None   Collection Time: 04/27/15 12:25 AM  Result Value Ref Range Status   MRSA by PCR NEGATIVE NEGATIVE Final    Comment:        The GeneXpert MRSA Assay (FDA approved for NASAL specimens only), is one component of a comprehensive MRSA colonization surveillance program. It is not intended to diagnose MRSA infection nor to guide or monitor treatment for MRSA infections.   C difficile quick scan w PCR reflex     Status: None   Collection Time: 04/28/15  3:10 AM  Result Value Ref Range Status   C Diff antigen NEGATIVE NEGATIVE Final   C Diff toxin NEGATIVE NEGATIVE Final   C Diff interpretation Negative for toxigenic C. difficile  Final  Surgical pcr screen     Status: None   Collection Time: 05/03/15  5:17 AM  Result Value Ref Range Status   MRSA, PCR NEGATIVE NEGATIVE Final   Staphylococcus aureus NEGATIVE NEGATIVE Final    Comment:        The Xpert SA Assay (FDA approved for NASAL specimens in patients over 65 years of age), is one component of a  comprehensive surveillance program.  Test performance has been validated by [facility] for patients greater than or equal to 61 year old. It is not intended to diagnose infection nor to guide or monitor treatment.   Anaerobic culture     Status: None (Preliminary result)   Collection Time: 05/03/15  8:36 AM  Result Value Ref Range Status   Specimen Description ABSCESS  Final   Special Requests SPEC A RETAINED HARDWARE AND CERVICAL ABSCESS  Final   Gram Stain PENDING  Incomplete   Culture   Final    NO ANAEROBES ISOLATED; CULTURE IN PROGRESS FOR 5 DAYS Performed at [facility]    Report Status PENDING  Incomplete  Gram stain     Status: None   Collection Time: 05/03/15  8:36 AM  Result Value Ref  Range Status   Specimen Description ABSCESS  Final   Special Requests SPEC A RETAINED HARDWARE AND CERVICAL ABSCESS  Final   Gram Stain   Final    FEW WBC PRESENT,BOTH PMN AND MONONUCLEAR NO ORGANISMS SEEN    Report Status 05/03/2015 FINAL  Final  Culture, routine-abscess     Status: None (Preliminary result)   Collection Time: 05/03/15  8:36 AM  Result Value Ref Range Status   Specimen Description ABSCESS  Final   Special Requests SPEC A RETAINED HARDWARE AND CERVICAL ABCESS  Final   Gram Stain   Final    RARE WBC PRESENT,BOTH PMN AND MONONUCLEAR NO SQUAMOUS EPITHELIAL CELLS SEEN NO ORGANISMS SEEN Performed at [facility]    Culture   Final    NO GROWTH 1 DAY Performed at [facility]    Report Status PENDING  Incomplete  Anaerobic culture     Status: None (Preliminary result)   Collection Time: 05/03/15  8:42 AM  Result Value Ref Range Status   Specimen Description ABSCESS  Final   Special Requests SPEC B NECK ABCESS C3  Final   Gram Stain   Final    NO WBC SEEN NO SQUAMOUS EPITHELIAL CELLS SEEN NO ORGANISMS SEEN Performed at [facility]    Culture   Final    NO ANAEROBES ISOLATED; CULTURE IN PROGRESS FOR 5 DAYS Performed at [facility]    Report Status PENDING  Incomplete  Gram stain     Status: None   Collection Time: 05/03/15  8:42 AM  Result Value Ref Range Status   Specimen Description ABSCESS  Final   Special Requests SPEC B NECK ABCESS C3  Final   Gram Stain   Final    FEW WBC PRESENT,BOTH PMN AND MONONUCLEAR NO ORGANISMS SEEN    Report Status 05/03/2015 FINAL  Final  Culture, routine-abscess     Status: None (Preliminary result)   Collection Time: 05/03/15  8:42 AM  Result Value Ref Range Status   Specimen Description ABSCESS  Final   Special Requests SPEC B NECK ABCESS C3  Final   Gram Stain   Final    NO WBC SEEN NO SQUAMOUS EPITHELIAL CELLS SEEN NO ORGANISMS SEEN Performed at [facility]    Culture    Final    Culture reincubated for better growth Performed at [facility]    Report Status PENDING  Incomplete  Gram stain     Status: None   Collection Time: 05/03/15  9:04 AM  Result Value Ref Range Status   Specimen Description TISSUE  Final   Special Requests [place]  Final   Gram Stain   Final    FEW WBC PRESENT,BOTH PMN AND MONONUCLEAR NO ORGANISMS SEEN    Report Status 05/03/2015 FINAL  Final  Anaerobic culture     Status: None (Preliminary result)   Collection Time: 05/03/15  9:05 AM  Result Value Ref Range Status   Specimen Description TISSUE  Final   Special Requests PHLEGMA FROM BEHIND EXPLATED PLATE  Final   Gram Stain PENDING  Incomplete   Culture   Final    NO ANAEROBES ISOLATED; CULTURE IN PROGRESS FOR 5 DAYS Performed at [facility]    Report Status PENDING  Incomplete  Tissue culture     Status: None (Preliminary result)   Collection Time: 05/03/15  9:05 AM  Result Value Ref Range Status   Specimen Description TISSUE  Final   Special Requests PHLEGMA FROM BEHIND EXPLATED PLATE  Final   Gram Stain   Final    NO WBC SEEN NO ORGANISMS SEEN Performed at [facility]    Culture   Final    NO GROWTH 2 DAYS Performed at [facility]    Report Status PENDING  Incomplete    Anti-infectives    Start     Dose/Rate Route Frequency Ordered Stop   05/03/15 1700  vancomycin (VANCOCIN) IVPB 750 mg/150 ml premix     750 mg 150 mL/hr over 60 Minutes Intravenous Every 8 hours 05/03/15 1142     05/03/15 1400  meropenem (MERREM) 1 g in sodium chloride 0.9 % 100 mL IVPB     1 g 200 mL/hr over 30 Minutes Intravenous Every 8 hours 05/03/15 1241     05/03/15 0745  vancomycin (VANCOCIN) IVPB 1000 mg/200 mL premix     1,000 mg 200 mL/hr over 60 Minutes Intravenous To Surgery 05/03/15 0740 05/03/15 1005   04/29/15 0200  vancomycin (VANCOCIN) IVPB 750 mg/150 ml premix  Status:  Discontinued     750 mg 150 mL/hr  over 60 Minutes Intravenous Every 8 hours 04/28/15 1816 05/01/15 1217   04/28/15 1900  vancomycin (VANCOCIN) 500 mg in sodium chloride 0.9 % 100 mL IVPB     500 mg 100 mL/hr over 60 Minutes Intravenous  Once 04/28/15 1815 04/28/15 1940   04/28/15 1400  meropenem (MERREM) 1 g in sodium chloride 0.9 % 100 mL IVPB  Status:  Discontinued     1 g 200 mL/hr over 30 Minutes Intravenous Every 8 hours 04/28/15 1312 05/01/15 1217   04/27/15 1400  ciprofloxacin (CIPRO) IVPB 400 mg  Status:  Discontinued     400 mg 200 mL/hr over 60 Minutes Intravenous Every 12 hours 04/27/15 0528 04/28/15 1302   04/27/15 0600  vancomycin (VANCOCIN) 500 mg in sodium chloride 0.9 % 100 mL IVPB  Status:  Discontinued     500 mg 100 mL/hr over 60 Minutes Intravenous Every 12 hours 04/26/15 1827 04/26/15 2358   04/27/15 0600  aztreonam (AZACTAM) 1 g in dextrose 5 % 50 mL IVPB  Status:  Discontinued     1 g 100 mL/hr over 30 Minutes Intravenous 3 times per day 04/26/15 1827 04/26/15 2358   04/27/15 0600  vancomycin (VANCOCIN) 500 mg in sodium chloride 0.9 % 100 mL IVPB  Status:  Discontinued     500 mg 100 mL/hr over 60 Minutes Intravenous Every 12 hours 04/27/15 0026 04/28/15 1815   04/27/15 0200  aztreonam (AZACTAM) 2 g in dextrose 5 % 50 mL IVPB  Status:  Discontinued  2 g 100 mL/hr over 30 Minutes Intravenous Every 8 hours 04/26/15 2358 04/28/15 1302   04/27/15 0000  ciprofloxacin (CIPRO) IVPB 200 mg  Status:  Discontinued     200 mg 100 mL/hr over 60 Minutes Intravenous Every 12 hours 04/26/15 2358 04/27/15 0528   04/26/15 1715  aztreonam (AZACTAM) 2 g in dextrose 5 % 50 mL IVPB     2 g 100 mL/hr over 30 Minutes Intravenous  Once 04/26/15 1711 04/26/15 1838   04/26/15 1715  vancomycin (VANCOCIN) IVPB 1000 mg/200 mL premix     1,000 mg 200 mL/hr over 60 Minutes Intravenous  Once 04/26/15 1711 04/26/15 1838      Assessment: 69 yo F transferred from [facility] for OR with exploration of cervical abscess. Pt was  previously on Vancomycin and Merrem prior to transfer. ID consulted on the patient and recommended discontinuing broad spectrum abx pre-op in hopes of obtaining cx in OR 8/18. Per their note, abx to be restarted post-op.   8/11 >> vancomycin >> 8/16, restart 8/18 >  8/11 >> aztreonam >> 8/13  8/12 >> cipro >> 8/13  8/13>> merrem >> 8/16; restart 8/18 >   8/11 blood x 2: NGF  8/11 urine: NGF  8/12 MRSA PCR: neg  8/12 strep pneumo/legionella urine ag: neg  8/13 cdiff antigen and toxin both neg   Dose changes/levels:  8/13 VT = 4 (on 500mg  q12h), increase to 750mg  q8h  8/15 VT = 18 on 750 q8h (SCr 0.32)  Goal of Therapy:  Vancomycin trough level 15-20 mcg/ml Eradication of infection  Plan:  - Continue vancomycin 750 mg IV q8h - Continue meropenem 1 g IV q8h - Recommend HH check a vancomycin trough in the next couple days to assess the q8h regimen  [facility], Economy.D., BCPS Clinical Pharmacist Pager: [phone] 05/05/2015 9:03 AM

## 2015-05-05 NOTE — Clinical Social Work Note (Signed)
CSW consult acknowledged:  Clinical Education officer, museum received consult for SNF placement. PT currently recommending Home Health. Clinical Social Worker will sign off for now as social work intervention is no longer needed. Please consult [place] again if new need arises.  Glendon Axe, MSW, LCSWA [phone] 05/05/2015 10:14 AM

## 2015-05-05 NOTE — Progress Notes (Signed)
PROGRESS NOTE  [name] KGM:[id number] DOB: 08-23-1946 DOA: 04/26/2015 PCP: Redmond Baseman, MD   Brief History 69 year old Caucasian female with a past medical history of coronary artery disease, insulin-dependent diabetes, COPD, history of breast cancer presented with right hip pain. She was noted to have a right hip prosthesis dislocation. She was also noted to have a fever. She was recently treated for pneumonia with Levaquin. CT scan of the chest revealed fluid collection in the lower neck and upper chest area. Patient was hospitalized for further management. She had a closed reduction of her hip dislocation. 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 There was concern for esophageal perforation. ENT was consulted. Orthopedics has been involved. The patient was transferred to [facility] for removal of hardware, irrigation and debridement and esophagoscopy. Assessment/Plan: Spondylodiskitis of cervical spine -04/30/15 MR of C-spine--diskitis and osteo of C3-4 and abnormal edema of retropharyngeal tissues with fluid collection anterior to C7 05/03/15--I&D with removal of hardware--Dr. Sheela Stack -abx stopped prior to surgery to maximize cultures-->restart vanc and merrem post op -abx per ID -anticipate 6-8 weeks IV abx -case discussed with Dr. Shon Baton -await final culture data -EXPLAINED TO PT ONE OF HER SURGICAL CULTURES MAY HAVE GROWTH WHICH MAY HELP TAILOR HER ABX--SHE WANTS TO GO HOME 8/21 REGARDLESS OF HER CULTURES--SHE DOES NOT WANT TO WAIT Esophageal perforation -appreciate Dr. Lazarus Salines -8/816 esophagascopy--73mm posterior wall perforation -npo for at least 1 month, then repeat esophagram -PEG placed on 05/04/15 -Appreciate general surgery--case discussed with Dr. Corliss Skains  Dislocation of the right hip prosthesis -Seen by orthopedics.  -had a  closed reduction on the morning of 8/12. Management per orthopedics.  -ID has recommended to consider MRI of the hip in case that is the source of infection. They will address this after surgery. Her hip replacement was in 2013, per patient. chronic low back pain/Chronic Pain Syndrome -Noted to be on long-acting narcotics at home.  -will not be able to use MS Contin with PEG -05/04/15--I spoke with her pain management team, Allen Derry, PA-C-->he recommended start Fentanyl 25 mcg transdermal -scheduled follow up appointment on 05/08/15 at 145PM with Dr. Sheran Luz who is the patient's pain management physician -D/C IV Morphine -use oxycodone liquid during the hospitalization--she will have to crush oxyIR tabs when she goes home Abnormal appearing gallbladder on CT scan LFTs are unremarkable. Patient remains nontender in the right upper quadrant. Ultrasound is as above. Gallbladder distention and minimal ductal dilatation was appreciated. But no cholecystitis identified. In view of the fact that her LFTs are unremarkable and the patient is asymptomatic, do not anticipate further evaluation currently. This can be addressed in the future with outpatient general surgery follow-up.  Hyponatremia,  -chronic dating back to 08/2014--127-132 -monitor off diuretics Hypertension -restart losartan per PEG - Hold furosemide  -elevation partly from pain hypokalemia, hypomagnesemia -repleted  Normocytic anemia Continue to monitor hemoglobin. Some drop in the hemoglobin is likely due to dilution. -baseline Hgb 10-11  History of coronary artery disease Stable. Continue to monitor. -continue ASA  History of COPD/tobacco abuse Continue nebulizer treatments. Inhalers. -Presently stable on 2 L nasal cannula -Pulmonary hygiene -Tobacco cessation discussed  History of diabetes mellitus type 2 -Sliding scale insulin coverage. Holding her oral agents. -CBGs controlled  DISPOSITION--CANNOT GO HOME  UNTIL FINAL SUSCEPTIBILITY FROM CULTURES RETURN AND PATIENT IS TOLERATING  TUBE FEED AT GOAL RATE -ANTICIPATE SUN OR MON. DISCHARGE        Procedures/Studies: Dg Cervical Spine 1 View  05/03/2015   CLINICAL DATA:  Cervical abscess and hardware removal  EXAM: DG CERVICAL SPINE - 1 VIEW  COMPARISON:  MRI cervical spine dated 04/30/2015  FINDINGS: Single lateral view of the cervical spine.  Surgical clamp is partially obscured by the patient's shoulders and likely marks the upper thoracic spine, possibly the inferior margin of T1, although poorly visualized.  The C2 through C7 vertebral bodies are marked on the intraoperative image.  IMPRESSION: Intraoperative cervical localization as above.   Electronically Signed   By: Charline Bills M.D.   On: 05/03/2015 09:52   Ct Soft Tissue Neck W Contrast  04/27/2015   CLINICAL DATA:  Paravertebral abscess. Abnormal CT chest yesterday. Cervical fusion 2010  EXAM: CT NECK WITH CONTRAST  TECHNIQUE: Multidetector CT imaging of the neck was performed using the standard protocol following the bolus administration of intravenous contrast.  CONTRAST:  OMNIPAQUE IOHEXOL 300 MG/ML  SOLN  COMPARISON:  CT chest 04/26/2015  FINDINGS: Pharynx and larynx: Normal tongue and oropharynx. Normal larynx. Extensive prevertebral soft tissue swelling and gas bubbles. Prevertebral swelling and fluid begins at approximately C2 and extends to T1. Rim enhancing fluid collection to the left of T1 compatible with abscess as noted on chest CT.  Salivary glands: Negative  Thyroid: Negative  Lymph nodes: Negative for adenopathy.  Vascular: Carotid artery patent bilaterally with mild atherosclerotic disease. Jugular vein patent bilaterally without thrombus.  Limited intracranial: Negative  Visualized orbits: Not imaged  Mastoids and visualized paranasal sinuses: Clear  Skeleton: ACDF C4 through C6. Anterior plate in good position. Solid fusion with strut graft. No bony destruction at  these levels. There is spondylosis at C4-5 causing spinal and foraminal stenosis. There is also foraminal encroachment bilaterally at C5-6 due to spurring.  4 mm retrolisthesis C3-4 with disc degeneration and spurring. Severe spinal stenosis. Endplates are irregular. Osteomyelitis and discitis not excluded at this level as a cause for infection. Cervical spine MRI would be helpful to evaluate for spinal infection and to exclude epidural abscess.  Upper chest: 6 mm right upper lobe nodule again noted and unchanged. Small left pleural effusion.  IMPRESSION: Extensive prevertebral edema and fluid mixed with gas bubbles. This is anterior to anterior plate fusion of C4 through C7. This is most likely arising from the spine. The infection could be arising from an infected fusion plate or possibly from discitis and osteomyelitis at C3-4. There is endplate irregularity at C3-4 with prominent posterior osteophyte formation. There is severe spinal stenosis at C3-4 and moderate to severe spinal stenosis at C4-5 due to spurring. 15 mm abscess to the left of T1.  MRI cervical spine without and with contrast suggested to exclude osteomyelitis of the spine and rule out epidural abscess.  These results were called by telephone at the time of interpretation on 04/27/2015 at 9:18 am to Dr. Flo Shanks , who verbally acknowledged these results.   Electronically Signed   By: Marlan Palau M.D.   On: 04/27/2015 09:08   Ct Chest W Contrast  04/26/2015   CLINICAL DATA:  Fever and cough.  Evaluate for pneumonia.  EXAM: CT CHEST WITH CONTRAST  TECHNIQUE: Multidetector CT imaging of the chest was performed during intravenous contrast administration.  CONTRAST:  80mL OMNIPAQUE IOHEXOL 300 MG/ML  SOLN  COMPARISON:  Chest radiograph 04/04/2015.  Chest CT 08/29/2014  FINDINGS: There is  abnormal soft tissue in the lower neck along the left side of the thyroid gland and surrounding the upper esophagus. There is a small focal low-density  structure along the left anterior aspect of C7 and T1. This collection has a small focus of gas within it and measures 1.1 x 0.9 x 2.0 cm. This collection is compatible with a soft tissue abscess. There is no significant fat plane between the proximal esophagus and the soft tissue abscess. Soft tissues in the left subcarinal region and distal esophageal region are indistinct. Difficult to exclude inflammatory changes around the distal esophageal region, particularly on sequence 2, image 31.  There are coronary artery calcifications. The gallbladder is distended and there is at least mild intrahepatic biliary dilatation. Limited evaluation of the anterior right liver due to artifact and motion. There may be abnormal enhancement along the anterior right hepatic lobe. In addition, there are slightly prominent lymph nodes in the upper abdomen and the left periaortic region. Some of these nodes were present on the exam from 2012.  The trachea and mainstem bronchi are patent. There appears chronic or recurrent filling defects in the left lower lobe bronchus and left lower lobe bronchi. There is chronic volume loss in the left lower lobe. Chronic patchy parenchymal densities in left lower lobe. Linear densities in medial right lower lobe could represent scar or atelectasis. There is a chronic of sub solid nodular structure in the anterior right upper lobe on sequence 4, image 18 measuring 5 mm. This has not significantly changed since 2012.  There is an anterior surgical plate in the cervical spine extending down to C6. There is no evidence for bone destruction involving the lower cervical spine or upper thoracic spine adjacent to the area of inflammation and abscess collection.  IMPRESSION: There is abnormal soft tissue in the lower neck and upper posterior mediastinum which likely represents edema and an inflammatory process. There is a small air-fluid collection along the left side of the cervical-thoracic junction  which is compatible with an abscess. There is no clear evidence for bone destruction. The source for this inflammatory process is unclear. Upper esophagus is poorly characterized.  There are endobronchial filling defects in the left lower lobe airways. Similar findings were present on prior examinations and this could represent an area of recurrent mucous plugging or aspiration. There is some chronic volume loss in the left lower lobe.  The soft tissue along the mid and distal esophagus is indistinct. Difficult to exclude an inflammatory process in this region.  Gallbladder distension with at least mild intrahepatic biliary dilatation. Difficult to exclude abnormal enhancement along the anterior right hepatic lobe. This area could be further characterized with ultrasound.  Critical Value/emergent results were called by telephone at the time of interpretation on 04/26/2015 at 7:47 pm to Dr. Elwin Mocha , who verbally acknowledged these results.   Electronically Signed   By: Richarda Overlie M.D.   On: 04/26/2015 20:03   Mr Cervical Spine W Wo Contrast  04/30/2015   CLINICAL DATA:  Followup C3-4 infection  EXAM: MRI CERVICAL SPINE WITHOUT AND WITH CONTRAST  TECHNIQUE: Multiplanar and multiecho pulse sequences of the cervical spine, to include the craniocervical junction and cervicothoracic junction, were obtained according to standard protocol without and with intravenous contrast.  CONTRAST:  11mL MULTIHANCE GADOBENATE DIMEGLUMINE 529 MG/ML IV SOLN  COMPARISON:  MRI 3 days ago.  CT 3 days ago.  FINDINGS: No change by imaging at this point. The foramen magnum is widely patent.  C1-2 and C2-3 are unremarkable.  C3-4: Findings consistent with discitis osteomyelitis at this level. Retrolisthesis of 2 mm. Endplate osteophytes. Narrowing of the canal with AP diameter of 5 mm. Effacement of the subarachnoid space surrounding the cord. Slight deformity of the cord. No cord edema. Some epidural enhancement at this level  consistent with epidural inflammation but no evidence of compressive epidural abscess or fluid collection.  Previous fusion from C4 through C6 with strut graft. Sufficient patency of the canal through that region. No epidural collection.  C6-7: Facet degeneration with 1 or 2 mm of anterolisthesis. No compressive canal stenosis. Mild foraminal narrowing bilaterally.  C7-T1: Abnormal edema and enhancement of the C7 vertebral body worrisome for osteomyelitis at this level as well. Abnormal fluid anterior and to the left of the C7 vertebral body consistent with adjacent soft tissue abscess.  IMPRESSION: No worrisome intraspinal change since the study of 3 days ago. Continuing evidence of discitis osteomyelitis at C3-4. Spinal canal narrowing with an AP diameter of 5 mm. To some deformity of the cord without cord edema. Epidural enhancement without evidence of drainable epidural collection.  Abnormal edema and enhancement of the C7 vertebral body consistent with osteomyelitis of that level as well.  Abnormal edema in the retropharyngeal tissues including a fluid collection anterior and to the left of the C7 vertebral body as seen previously.   Electronically Signed   By: Paulina Fusi M.D.   On: 04/30/2015 10:56   Mr Cervical Spine W Wo Contrast  04/27/2015   CLINICAL DATA:  Neck abscess. Dysphagia. Rule out discitis and osteomyelitis. Cervical fusion 2010.  EXAM: MRI CERVICAL SPINE WITHOUT AND WITH CONTRAST  TECHNIQUE: Multiplanar and multiecho pulse sequences of the cervical spine, to include the craniocervical junction and cervicothoracic junction, were obtained according to standard protocol without and with intravenous contrast.  CONTRAST:  12mL MULTIHANCE GADOBENATE DIMEGLUMINE 529 MG/ML IV SOLN  COMPARISON:  CT neck 04/27/2015  FINDINGS: The patient was not able to hold still. Image quality degraded by moderate motion.  ACDF with anterior plate C4 through C6. Fusion appears solid. No significant enhancement  related to the fusion. Moderate to severe spinal stenosis at C4-5 due to spurring. Marked foraminal encroachment bilaterally at C4-5. Mild to moderate spinal and moderate foraminal stenosis bilaterally at C5-6.  There is abnormal bone marrow edema and enhancement throughout C3 and C4. The disc space is ill-defined and appears eroded. There is enhancement of the ventral epidural soft tissues which may represent infection/epidural abscess. There is posterior osteophyte formation and severe spinal stenosis with cord compression. Possible mild cord edema. There is also edema and enhancement involving the posterior elements of C3 which may be related to infection as well.  Extensive prevertebral soft tissue swelling from C2 through T1. Gas bubbles are noted in the prevertebral tissues on CT. 15 mm epidural abscess is present to the left of T1, better seen on the prior CT.  IMPRESSION: Extensive prevertebral soft tissue edema with gas bubbles in the tissues consistent with infection. 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 Enhancement extends into the posterior elements of C3 which may also be infected. There is ventral epidural enhancement which may be due to abscess/ phlegmon. There is severe spinal stenosis and cord compression at C3-4. Cervical fusion at C4-5 and C5-6. Edema and enhancement extends into the C4 vertebral body which could be infected. Evidence of abnormal enhancement at C5 or C6. Moderate to severe spinal stenosis at C4-5 and mild to moderate  spinal stenosis at C5-6.  15 mm epidural abscess to the left of T1, better seen on the prior CT.  These results were called by telephone at the time of interpretation on 04/27/2015 at 3:00 pm to Dr. Flo Shanks , who verbally acknowledged these results.   Electronically Signed   By: Marlan Palau M.D.   On: 04/27/2015 15:03   Dg Pelvis Portable  04/26/2015   CLINICAL DATA:  Right hip dislocation.  EXAM: PORTABLE PELVIS 1-2 VIEWS  COMPARISON:   03/26/2015  FINDINGS: The right hip prosthesis is dislocated with the femoral component displacing superior to the acetabular component. The prosthetic components remain well seated. There is no acute fracture.  Left hip prosthesis is well-seated and aligned.  Bones are demineralized.  IMPRESSION: Dislocated right hip prosthesis. No prosthetic loosening or evidence of a fracture.   Electronically Signed   By: Amie Portland M.D.   On: 04/26/2015 17:00   Dg Hip Port Unilat With Pelvis 1v Right  04/26/2015   CLINICAL DATA:  69 year old female with right hip arthroplasty complicated by recent right hip dislocation status post right hip reduction.  EXAM: DG HIP (WITH OR WITHOUT PELVIS) 1V PORT RIGHT  COMPARISON:  04/26/2015.  FINDINGS: Postoperative changes of right total hip arthroplasty again noted. The prosthetic femoral head has been successfully relocated within the prosthetic acetabulum. No periprosthetic fractures are identified. Iodinated contrast material in the urinary bladder incidentally noted, presumably related to recent contrast enhanced chest CT.  IMPRESSION: 1. Successful relocation of right hip, without acute complicating features.   Electronically Signed   By: Trudie Reed M.D.   On: 04/26/2015 20:19   US Abdomen Limited Ruq  04/27/2015   CLINICAL DATA:  Cholecystitis, incidental finding of gallbladder distension on recent emergency room workup  EXAM: US ABDOMEN LIMITED - RIGHT UPPER QUADRANT  COMPARISON:  None.  FINDINGS: Gallbladder:  Distended gallbladder. Echogenic foci along the gallbladder wall without shadowing which may reflect gallbladder sludge. Unable to place the patient in decubitus position. No gallbladder wall thickening or pericholecystic fluid. No sonographic Murphy sign noted.  Common bile duct:  Diameter: 5.7 mm  Liver:  No focal hepatic mass. Mild intrahepatic biliary ductal dilatation. Small amount of perihepatic fluid. Otherwise normal hepatic echogenicity.  IMPRESSION:  1. Gallbladder distention and mild intrahepatic biliary ductal dilatation. No sonographic findings to suggest acute cholecystitis. 2. Small amount of echogenic material in the gallbladder dependently which may reflect gallbladder sludge. 3. Small amount of perihepatic fluid.   Electronically Signed   By: Elige Ko   On: 04/27/2015 09:35   Dg Esophagus W/water Sol Cm  04/30/2015   CLINICAL DATA:  Postop from cervical spine fusion. Severe neck pain and cervical spine osteomyelitis. Evaluate for esophageal perforation.  EXAM: ESOPHOGRAM/BARIUM SWALLOW  TECHNIQUE: Single contrast examination was performed using 50 mL water-soluble Omnipaque 300.  FLUOROSCOPY TIME:  Fluoroscopy Time:  2 minutes 52 seconds  Number of Acquired Images:  7  COMPARISON:  None.  FINDINGS: Exam was technically difficult due to severe patient pain, limited mobility, and limited ability to cooperate with positioning.  Anterior cervical spine fixation hardware is noted. There is no evidence of esophageal contrast leak or perforation. No evidence of esophageal obstruction or dilatation.  IMPRESSION: Technically difficult exam. No evidence of esophageal leak or perforation.   Electronically Signed   By: Myles Rosenthal M.D.   On: 04/30/2015 12:34         Subjective: Patient continues to complain of back pain  but slightly better than yesterday. Denies any fevers, chills, chest pain control is breath, nausea, vomiting, diarrhea. She is passing flatus.  Objective: Filed Vitals:   05/04/15 1200 05/04/15 1310 05/04/15 2002 05/05/15 0518  BP:  141/52 179/65 165/76  Pulse:  60 62 72  Temp: 96.4 F (35.8 C) 98 F (36.7 C) 98.5 F (36.9 C) 97.3 F (36.3 C)  TempSrc:  Oral Oral Oral  Resp:  18 17 16   Height:      Weight:      SpO2:  100% 98% 97%    Intake/Output Summary (Last 24 hours) at 05/05/15 1809 Last data filed at 05/05/15 1300  Gross per 24 hour  Intake      0 ml  Output    805 ml  Net   -805 ml   Weight change:    Exam:   General:  Pt is alert, follows commands appropriately, not in acute distress  HEENT: No icterus, No thrush, No neck mass, [place]/AT  Cardiovascular: RRR, S1/S2, no rubs, no gallops  Respiratory: CTA bilaterally, no wheezing, no crackles, no rhonchi  Abdomen: Soft/+BS, non tender, non distended, no guarding; no hepatosplenomegaly  Extremities: No edema, No lymphangitis, No petechiae, No rashes, no synovitis; no cyanosis or clubbing  Data Reviewed: Basic Metabolic Panel:  Recent Labs Lab 04/30/15 0405 05/01/15 0516 05/02/15 0541 05/04/15 0410 05/05/15 0500  NA 133* 133* 133* 130* 132*  K 3.5 3.4* 4.0 3.7 3.1*  CL 99* 99* 101 98* 96*  CO2 26 26 27 23 28   GLUCOSE 130* 111* 106* 112* 122*  BUN <5* 5* 7 6 <5*  CREATININE 0.32* 0.33* 0.35* 0.38* 0.38*  CALCIUM 8.3* 8.2* 8.4* 8.3* 8.2*  MG 1.6*  --  1.7  --  1.8   Liver Function Tests: No results for input(s): AST, ALT, ALKPHOS, BILITOT, PROT, ALBUMIN in the last 168 hours. No results for input(s): LIPASE, AMYLASE in the last 168 hours. No results for input(s): AMMONIA in the last 168 hours. CBC:  Recent Labs Lab 04/29/15 0335 05/01/15 0516 05/02/15 0541 05/04/15 0410 05/05/15 0500  WBC 8.2 11.0* 9.9 10.8* 9.7  HGB 10.2* 11.5* 11.0* 10.9* 11.1*  HCT 30.2* 34.5* 33.7* 32.9* 33.8*  MCV 81.0 81.0 82.2 81.6 81.4  PLT 256 291 294 313 362   Cardiac Enzymes: No results for input(s): CKTOTAL, CKMB, CKMBINDEX, TROPONINI in the last 168 hours. BNP: Invalid input(s): POCBNP CBG:  Recent Labs Lab 05/04/15 2132 05/05/15 0217 05/05/15 0552 05/05/15 1154 05/05/15 1530  GLUCAP 118* 150* 125* 134* 119*    Recent Results (from the past 240 hour(s))  Blood Culture (routine x 2)     Status: None   Collection Time: 04/26/15  5:27 PM  Result Value Ref Range Status   Specimen Description BLOOD LEFT FOREARM  Final   Special Requests BOTTLES DRAWN AEROBIC AND ANAEROBIC 5CC  Final   Culture   Final    NO GROWTH 5  DAYS Performed at [facility]    Report Status 05/01/2015 FINAL  Final  Blood Culture (routine x 2)     Status: None   Collection Time: 04/26/15  5:29 PM  Result Value Ref Range Status   Specimen Description BLOOD LEFT ANTECUBITAL  Final   Special Requests BOTTLES DRAWN AEROBIC AND ANAEROBIC 5CC  Final   Culture   Final    NO GROWTH 5 DAYS Performed at [facility]    Report Status 05/01/2015 FINAL  Final  Urine culture  Status: None   Collection Time: 04/26/15  5:48 PM  Result Value Ref Range Status   Specimen Description URINE, CLEAN CATCH  Final   Special Requests NONE  Final   Culture   Final    NO GROWTH 2 DAYS Performed at [facility]    Report Status 04/28/2015 FINAL  Final  MRSA PCR Screening     Status: None   Collection Time: 04/27/15 12:25 AM  Result Value Ref Range Status   MRSA by PCR NEGATIVE NEGATIVE Final    Comment:        The GeneXpert MRSA Assay (FDA approved for NASAL specimens only), is one component of a comprehensive MRSA colonization surveillance program. It is not intended to diagnose MRSA infection nor to guide or monitor treatment for MRSA infections.   C difficile quick scan w PCR reflex     Status: None   Collection Time: 04/28/15  3:10 AM  Result Value Ref Range Status   C Diff antigen NEGATIVE NEGATIVE Final   C Diff toxin NEGATIVE NEGATIVE Final   C Diff interpretation Negative for toxigenic C. difficile  Final  Surgical pcr screen     Status: None   Collection Time: 05/03/15  5:17 AM  Result Value Ref Range Status   MRSA, PCR NEGATIVE NEGATIVE Final   Staphylococcus aureus NEGATIVE NEGATIVE Final    Comment:        The Xpert SA Assay (FDA approved for NASAL specimens in patients over 19 years of age), is one component of a comprehensive surveillance program.  Test performance has been validated by [facility] for patients greater than or equal to 54 year old. It is not intended to diagnose infection  nor to guide or monitor treatment.   Anaerobic culture     Status: None (Preliminary result)   Collection Time: 05/03/15  8:36 AM  Result Value Ref Range Status   Specimen Description ABSCESS  Final   Special Requests SPEC A RETAINED HARDWARE AND CERVICAL ABSCESS  Final   Gram Stain PENDING  Incomplete   Culture   Final    NO ANAEROBES ISOLATED; CULTURE IN PROGRESS FOR 5 DAYS Performed at [facility]    Report Status PENDING  Incomplete  Gram stain     Status: None   Collection Time: 05/03/15  8:36 AM  Result Value Ref Range Status   Specimen Description ABSCESS  Final   Special Requests SPEC A RETAINED HARDWARE AND CERVICAL ABSCESS  Final   Gram Stain   Final    FEW WBC PRESENT,BOTH PMN AND MONONUCLEAR NO ORGANISMS SEEN    Report Status 05/03/2015 FINAL  Final  Culture, routine-abscess     Status: None (Preliminary result)   Collection Time: 05/03/15  8:36 AM  Result Value Ref Range Status   Specimen Description ABSCESS  Final   Special Requests SPEC A RETAINED HARDWARE AND CERVICAL ABCESS  Final   Gram Stain   Final    RARE WBC PRESENT,BOTH PMN AND MONONUCLEAR NO SQUAMOUS EPITHELIAL CELLS SEEN NO ORGANISMS SEEN Performed at [facility]    Culture   Final    NO GROWTH 1 DAY Performed at [facility]    Report Status PENDING  Incomplete  Anaerobic culture     Status: None (Preliminary result)   Collection Time: 05/03/15  8:42 AM  Result Value Ref Range Status   Specimen Description ABSCESS  Final   Special Requests SPEC B NECK ABCESS C3  Final  Gram Stain   Final    NO WBC SEEN NO SQUAMOUS EPITHELIAL CELLS SEEN NO ORGANISMS SEEN Performed at [facility]    Culture   Final    NO ANAEROBES ISOLATED; CULTURE IN PROGRESS FOR 5 DAYS Performed at [facility]    Report Status PENDING  Incomplete  Gram stain     Status: None   Collection Time: 05/03/15  8:42 AM  Result Value Ref Range Status   Specimen Description ABSCESS   Final   Special Requests SPEC B NECK ABCESS C3  Final   Gram Stain   Final    FEW WBC PRESENT,BOTH PMN AND MONONUCLEAR NO ORGANISMS SEEN    Report Status 05/03/2015 FINAL  Final  Culture, routine-abscess     Status: None (Preliminary result)   Collection Time: 05/03/15  8:42 AM  Result Value Ref Range Status   Specimen Description ABSCESS  Final   Special Requests SPEC B NECK ABCESS C3  Final   Gram Stain   Final    NO WBC SEEN NO SQUAMOUS EPITHELIAL CELLS SEEN NO ORGANISMS SEEN Performed at [facility]    Culture   Final    Culture reincubated for better growth Performed at [facility]    Report Status PENDING  Incomplete  Gram stain     Status: None   Collection Time: 05/03/15  9:04 AM  Result Value Ref Range Status   Specimen Description TISSUE  Final   Special Requests PHLEGMA FROM BEHIND EXPLATED PLATE  Final   Gram Stain   Final    FEW WBC PRESENT,BOTH PMN AND MONONUCLEAR NO ORGANISMS SEEN    Report Status 05/03/2015 FINAL  Final  Anaerobic culture     Status: None (Preliminary result)   Collection Time: 05/03/15  9:05 AM  Result Value Ref Range Status   Specimen Description TISSUE  Final   Special Requests PHLEGMA FROM BEHIND EXPLATED PLATE  Final   Gram Stain PENDING  Incomplete   Culture   Final    NO ANAEROBES ISOLATED; CULTURE IN PROGRESS FOR 5 DAYS Performed at [facility]    Report Status PENDING  Incomplete  Tissue culture     Status: None (Preliminary result)   Collection Time: 05/03/15  9:05 AM  Result Value Ref Range Status   Specimen Description TISSUE  Final   Special Requests PHLEGMA FROM BEHIND EXPLATED PLATE  Final   Gram Stain   Final    NO WBC SEEN NO ORGANISMS SEEN Performed at [facility]    Culture   Final    NO GROWTH 2 DAYS Performed at [facility]    Report Status PENDING  Incomplete     Scheduled Meds: . enoxaparin (LOVENOX) injection  40 mg Subcutaneous Q24H  . fentaNYL  25  mcg Transdermal Q72H  . insulin aspart  0-9 Units Subcutaneous Q4H  . meropenem (MERREM) IV  1 g Intravenous Q8H  . sodium chloride  10-40 mL Intracatheter Q12H  . sodium chloride  3 mL Intravenous Q12H  . vancomycin  750 mg Intravenous Q8H   Continuous Infusions: . sodium chloride 250 mL (05/03/15 1244)  . feeding supplement (JEVITY 1.2 CAL) 1,000 mL (05/05/15 1347)  . lactated ringers 10 mL/hr at 05/04/15 0912     Annabell Oconnor, DO  [facility] Pager [phone]  If 7PM-7AM, please contact night-coverage [email or web address] Password [email or web address] 05/05/2015, 6:09 PM   LOS: 9 days

## 2015-05-05 NOTE — Progress Notes (Signed)
05/05/2015 12:25 PM  Jeanie Sewer [phone]  Post-Op Day 2    Temp:  [97.3 F (36.3 C)-98.5 F (36.9 C)] 97.3 F (36.3 C) (08/20 0518) Pulse Rate:  [60-72] 72 (08/20 0518) Resp:  [16-18] 16 (08/20 0518) BP: (141-179)/(52-76) 165/76 mmHg (08/20 0518) SpO2:  [97 %-100 %] 97 % (08/20 0518),     Intake/Output Summary (Last 24 hours) at 05/05/15 1225 Last data filed at 05/05/15 1120  Gross per 24 hour  Intake      0 ml  Output    955 ml  Net   -955 ml    Results for orders placed or performed during the hospital encounter of 04/26/15 (from the past 24 hour(s))  Glucose, capillary     Status: Abnormal   Collection Time: 05/04/15 12:27 PM  Result Value Ref Range   Glucose-Capillary 103 (H) 65 - 99 mg/dL  Glucose, capillary     Status: Abnormal   Collection Time: 05/04/15  4:46 PM  Result Value Ref Range   Glucose-Capillary 101 (H) 65 - 99 mg/dL  Glucose, capillary     Status: Abnormal   Collection Time: 05/04/15  9:32 PM  Result Value Ref Range   Glucose-Capillary 118 (H) 65 - 99 mg/dL  Glucose, capillary     Status: Abnormal   Collection Time: 05/05/15  2:17 AM  Result Value Ref Range   Glucose-Capillary 150 (H) 65 - 99 mg/dL  CBC     Status: Abnormal   Collection Time: 05/05/15  5:00 AM  Result Value Ref Range   WBC 9.7 4.0 - 10.5 K/uL   RBC 4.15 3.87 - 5.11 MIL/uL   Hemoglobin 11.1 (L) 12.0 - 15.0 g/dL   HCT 33.8 (L) 36.0 - 46.0 %   MCV 81.4 78.0 - 100.0 fL   MCH 26.7 26.0 - 34.0 pg   MCHC 32.8 30.0 - 36.0 g/dL   RDW 15.4 11.5 - 15.5 %   Platelets 362 150 - 400 K/uL  Basic metabolic panel     Status: Abnormal   Collection Time: 05/05/15  5:00 AM  Result Value Ref Range   Sodium 132 (L) 135 - 145 mmol/L   Potassium 3.1 (L) 3.5 - 5.1 mmol/L   Chloride 96 (L) 101 - 111 mmol/L   CO2 28 22 - 32 mmol/L   Glucose, Bld 122 (H) 65 - 99 mg/dL   BUN <5 (L) 6 - 20 mg/dL   Creatinine, Ser 0.38 (L) 0.44 - 1.00 mg/dL   Calcium 8.2 (L) 8.9 - 10.3 mg/dL   GFR calc non  Af Amer >60 >60 mL/min   GFR calc Af Amer >60 >60 mL/min   Anion gap 8 5 - 15  Magnesium     Status: None   Collection Time: 05/05/15  5:00 AM  Result Value Ref Range   Magnesium 1.8 1.7 - 2.4 mg/dL  Glucose, capillary     Status: Abnormal   Collection Time: 05/05/15  5:52 AM  Result Value Ref Range   Glucose-Capillary 125 (H) 65 - 99 mg/dL  Glucose, capillary     Status: Abnormal   Collection Time: 05/05/15 11:54 AM  Result Value Ref Range   Glucose-Capillary 134 (H) 65 - 99 mg/dL    SUBJECTIVE:  Some neck pain, belly pain. No breathing difficulty.  Voice strong  OBJECTIVE:  Soft collar in place.  Drains out.   Voice good.  IMPRESSION:  Satisfactory check  PLAN:  OK with discharge per Dr. Rolena Infante and ID.  Recommend npo at least 1 mo.  Peridex oral swish/spit qid x 1 mo. Recheck my office 2-3 weeks.    Jodi Marble

## 2015-05-05 NOTE — Progress Notes (Addendum)
Called Dr. [name] regarding to the status of G tube drainage bag. He said it is Ok to D/C the drainage on day two postop. Pt will start tube feeding once the supplies and feeding supplement send up.

## 2015-05-06 ENCOUNTER — Encounter (HOSPITAL_COMMUNITY): Payer: Self-pay | Admitting: Surgery

## 2015-05-06 DIAGNOSIS — M4622 Osteomyelitis of vertebra, cervical region: Secondary | ICD-10-CM | POA: Diagnosis not present

## 2015-05-06 DIAGNOSIS — T849XXD Unspecified complication of internal orthopedic prosthetic device, implant and graft, subsequent encounter: Secondary | ICD-10-CM | POA: Diagnosis not present

## 2015-05-06 DIAGNOSIS — J853 Abscess of mediastinum: Secondary | ICD-10-CM | POA: Diagnosis not present

## 2015-05-06 DIAGNOSIS — Z452 Encounter for adjustment and management of vascular access device: Secondary | ICD-10-CM | POA: Diagnosis not present

## 2015-05-06 DIAGNOSIS — T84020D Dislocation of internal right hip prosthesis, subsequent encounter: Secondary | ICD-10-CM | POA: Diagnosis not present

## 2015-05-06 DIAGNOSIS — Z434 Encounter for attention to other artificial openings of digestive tract: Secondary | ICD-10-CM | POA: Diagnosis not present

## 2015-05-06 LAB — TISSUE CULTURE
Culture: NO GROWTH
Gram Stain: NONE SEEN

## 2015-05-06 LAB — CULTURE, ROUTINE-ABSCESS: GRAM STAIN: NONE SEEN

## 2015-05-06 LAB — BASIC METABOLIC PANEL
ANION GAP: 5 (ref 5–15)
BUN: <5 mg/dL — ABNORMAL LOW (ref 6–20)
CHLORIDE: 97 mmol/L — AB (ref 101–111)
CO2: 31 mmol/L (ref 22–32)
Calcium: 8.5 mg/dL — ABNORMAL LOW (ref 8.9–10.3)
Creatinine, Ser: 0.35 mg/dL — ABNORMAL LOW (ref 0.44–1.00)
GFR calc non Af Amer: >60 mL/min (ref >60)
Glucose, Bld: 164 mg/dL — ABNORMAL HIGH (ref 65–99)
POTASSIUM: 3.2 mmol/L — AB (ref 3.5–5.1)
Sodium: 133 mmol/L — ABNORMAL LOW (ref 135–145)

## 2015-05-06 LAB — GLUCOSE, CAPILLARY
GLUCOSE-CAPILLARY: 155 mg/dL — AB (ref 65–99)
GLUCOSE-CAPILLARY: 145 mg/dL — AB (ref 65–99)
Glucose-Capillary: 116 mg/dL — ABNORMAL HIGH (ref 65–99)
Glucose-Capillary: 129 mg/dL — ABNORMAL HIGH (ref 65–99)
Glucose-Capillary: 145 mg/dL — ABNORMAL HIGH (ref 65–99)

## 2015-05-06 LAB — MAGNESIUM: Magnesium: 1.9 mg/dL (ref 1.7–2.4)

## 2015-05-06 MED ORDER — HEPARIN SOD (PORK) LOCK FLUSH 100 UNIT/ML IV SOLN
250.0000 [IU] | INTRAVENOUS | Status: AC | PRN
  Administered 2015-05-06: 250 [IU]

## 2015-05-06 MED ORDER — JEVITY 1.2 CAL PO LIQD: ORAL | Status: DC

## 2015-05-06 MED ORDER — FREE WATER: 60.0000 mL | Freq: Three times a day (TID) | Status: DC

## 2015-05-06 MED ORDER — SODIUM CHLORIDE 0.9 % IV SOLN: 1.0000 g | Freq: Three times a day (TID) | INTRAVENOUS | Status: DC

## 2015-05-06 MED ORDER — FENTANYL 25 MCG/HR TD PT72: 25.0000 ug | MEDICATED_PATCH | TRANSDERMAL | Status: DC

## 2015-05-06 NOTE — Progress Notes (Signed)
   Subjective: 2 Days Post-Op Procedure(s) (LRB): OPEN GASTROSTOMY WITH TUBE PLACEMENT (N/A) Patient reports pain as mild.   P  Objective: Vital signs in last 24 hours: Temp:  [97.3 F (36.3 C)-98.3 F (36.8 C)] 97.3 F (36.3 C) (08/21 0626) Pulse Rate:  [69-74] 69 (08/21 0626) Resp:  [17-18] 18 (08/21 0626) BP: (155-162)/(80-81) 162/80 mmHg (08/21 0626) SpO2:  [96 %-99 %] 99 % (08/21 0626) Weight:  [57.3 kg (126 lb 5.2 oz)] 57.3 kg (126 lb 5.2 oz) (08/21 0219)  Intake/Output from previous day:  Intake/Output Summary (Last 24 hours) at 05/06/15 0922 Last data filed at 05/05/15 2301  Gross per 24 hour  Intake     13 ml  Output    200 ml  Net   -187 ml     Labs:  Recent Labs  05/04/15 0410 05/05/15 0500  HGB 10.9* 11.1*    Recent Labs  05/04/15 0410 05/05/15 0500  WBC 10.8* 9.7  RBC 4.03 4.15  HCT 32.9* 33.8*  PLT 313 362    Recent Labs  05/05/15 0500 05/06/15 0527  NA 132* 133*  K 3.1* 3.2*  CL 96* 97*  CO2 28 31  BUN <5* <5*  CREATININE 0.38* 0.35*  GLUCOSE 122* 164*  CALCIUM 8.2* 8.5*    EXAM General - Patient is Alert and Oriented Extremity - Neurologically intact Neurovascular intact Dressing/Incision - clean, dry, no drainage Motor Function - intact, moving hands and fingers well on exam.   Past Medical History  Diagnosis Date  . Myocardial infarction 2000  . Diabetes mellitus type 2, insulin dependent   . COPD (chronic obstructive pulmonary disease)   . Hyperlipidemia   . OA (osteoarthritis) of knee   . Hypertension   . Coronary artery disease   . Asthma   . Elevated LFTs   . ETOH abuse   . Tobacco abuse   . Osteoarthritis   . Breast cancer     Assessment/Plan: 2 Days Post-Op Procedure(s) (LRB): OPEN GASTROSTOMY WITH TUBE PLACEMENT (N/A) Principal Problem:   Osteomyelitis of cervical spine Active Problems:   Diabetes mellitus type 2, insulin dependent   COPD (chronic obstructive pulmonary disease)   Tobacco  dependence   Hyperlipidemia   Hypertension   Hypokalemia   Hyponatremia   Recurrent dislocation of hip joint prosthesis   Hip dislocation, right   Healthcare-associated pneumonia   Cholecystitis, acute   Mediastinal abscess   Abscess of neck   Esophageal perforation   Loss of weight   Dysphagia, pharyngoesophageal phase   Abscess   Discitis   HCAP (healthcare-associated pneumonia)   Cholecystitis   Esophageal fistula  Estimated body mass index is 23.1 kg/(m^2) as calculated from the following:   Height as of this encounter: 5\' 2"  (1.575 m).   Weight as of this encounter: 57.3 kg (126 lb 5.2 oz).   DC home when ready with [place], PA-C Orthopaedic Surgery 05/06/2015, 9:22 AM

## 2015-05-06 NOTE — Progress Notes (Signed)
2 Days Post-Op  Subjective: Tolerating tube feeds at 55 ml/hr Minimal abdominal pain  Objective: Vital signs in last 24 hours: Temp:  [97.3 F (36.3 C)-98.3 F (36.8 C)] 97.3 F (36.3 C) (08/21 0626) Pulse Rate:  [69-74] 69 (08/21 0626) Resp:  [17-18] 18 (08/21 0626) BP: (155-162)/(80-81) 162/80 mmHg (08/21 0626) SpO2:  [96 %-99 %] 99 % (08/21 0626) Weight:  [57.3 kg (126 lb 5.2 oz)] 57.3 kg (126 lb 5.2 oz) (08/21 0219) Last BM Date: 05/04/15  Intake/Output from previous day: 08/20 0701 - 08/21 0700 In: 13 [I.V.:13] Out: 500 [Urine:400; Drains:100] Intake/Output this shift:    General appearance: alert, cooperative and no distress GI: soft, minimal abdominal tenderness at incision  Lab Results:   Recent Labs  05/04/15 0410 05/05/15 0500  WBC 10.8* 9.7  HGB 10.9* 11.1*  HCT 32.9* 33.8*  PLT 313 362   BMET  Recent Labs  05/05/15 0500 05/06/15 0527  NA 132* 133*  K 3.1* 3.2*  CL 96* 97*  CO2 28 31  GLUCOSE 122* 164*  BUN <5* <5*  CREATININE 0.38* 0.35*  CALCIUM 8.2* 8.5*   PT/INR No results for input(s): LABPROT, INR in the last 72 hours. ABG No results for input(s): PHART, HCO3 in the last 72 hours.  Invalid input(s): PCO2, PO2  Studies/Results: No results found.  Anti-infectives: Anti-infectives    Start     Dose/Rate Route Frequency Ordered Stop   05/03/15 1700  vancomycin (VANCOCIN) IVPB 750 mg/150 ml premix     750 mg 150 mL/hr over 60 Minutes Intravenous Every 8 hours 05/03/15 1142     05/03/15 1400  meropenem (MERREM) 1 g in sodium chloride 0.9 % 100 mL IVPB     1 g 200 mL/hr over 30 Minutes Intravenous Every 8 hours 05/03/15 1241     05/03/15 0745  vancomycin (VANCOCIN) IVPB 1000 mg/200 mL premix     1,000 mg 200 mL/hr over 60 Minutes Intravenous To Surgery 05/03/15 0740 05/03/15 1005   04/29/15 0200  vancomycin (VANCOCIN) IVPB 750 mg/150 ml premix  Status:  Discontinued     750 mg 150 mL/hr over 60 Minutes Intravenous Every 8 hours  04/28/15 1816 05/01/15 1217   04/28/15 1900  vancomycin (VANCOCIN) 500 mg in sodium chloride 0.9 % 100 mL IVPB     500 mg 100 mL/hr over 60 Minutes Intravenous  Once 04/28/15 1815 04/28/15 1940   04/28/15 1400  meropenem (MERREM) 1 g in sodium chloride 0.9 % 100 mL IVPB  Status:  Discontinued     1 g 200 mL/hr over 30 Minutes Intravenous Every 8 hours 04/28/15 1312 05/01/15 1217   04/27/15 1400  ciprofloxacin (CIPRO) IVPB 400 mg  Status:  Discontinued     400 mg 200 mL/hr over 60 Minutes Intravenous Every 12 hours 04/27/15 0528 04/28/15 1302   04/27/15 0600  vancomycin (VANCOCIN) 500 mg in sodium chloride 0.9 % 100 mL IVPB  Status:  Discontinued     500 mg 100 mL/hr over 60 Minutes Intravenous Every 12 hours 04/26/15 1827 04/26/15 2358   04/27/15 0600  aztreonam (AZACTAM) 1 g in dextrose 5 % 50 mL IVPB  Status:  Discontinued     1 g 100 mL/hr over 30 Minutes Intravenous 3 times per day 04/26/15 1827 04/26/15 2358   04/27/15 0600  vancomycin (VANCOCIN) 500 mg in sodium chloride 0.9 % 100 mL IVPB  Status:  Discontinued     500 mg 100 mL/hr over 60 Minutes Intravenous Every  12 hours 04/27/15 0026 04/28/15 1815   04/27/15 0200  aztreonam (AZACTAM) 2 g in dextrose 5 % 50 mL IVPB  Status:  Discontinued     2 g 100 mL/hr over 30 Minutes Intravenous Every 8 hours 04/26/15 2358 04/28/15 1302   04/27/15 0000  ciprofloxacin (CIPRO) IVPB 200 mg  Status:  Discontinued     200 mg 100 mL/hr over 60 Minutes Intravenous Every 12 hours 04/26/15 2358 04/27/15 0528   04/26/15 1715  aztreonam (AZACTAM) 2 g in dextrose 5 % 50 mL IVPB     2 g 100 mL/hr over 30 Minutes Intravenous  Once 04/26/15 1711 04/26/15 1838   04/26/15 1715  vancomycin (VANCOCIN) IVPB 1000 mg/200 mL premix     1,000 mg 200 mL/hr over 60 Minutes Intravenous  Once 04/26/15 1711 04/26/15 1838      Assessment/Plan: s/p Procedure(s): OPEN GASTROSTOMY WITH TUBE PLACEMENT (N/A) OK for discharge from surgical standpoint if all HHN  arrangements have been made.  Primary team can confer with nutrition to see if she is going to be on continuous feeds vs. Cyclic boluses. Follow-up in 2 weeks for staple removal.  LOS: 10 days    Brownie Gockel K. 05/06/2015

## 2015-05-06 NOTE — Care Management Note (Signed)
Case Management Note  Patient Details  Name: [name] MRN: [id number] Date of Birth: Oct 16, 1945  Subjective/Objective:                   Open gastrostomy tube placement/R hip dislocation/osteomylitis Action/Plan: Discharge planning  Expected Discharge Date:  05/06/15               Expected Discharge Plan:  [place]  In-House Referral:  NA  Discharge planning Services  CM Consult  Post Acute Care Choice:  Home Health Choice offered to:  Patient  DME Arranged:  IV pump/equipment DME Agency:  [place]:  PT, RN [facility] Agency:  [place]  Status of Service:  Completed, signed off  Medicare Important Message Given:  Yes-second notification given Date Medicare IM Given:    Medicare IM give by:    Date Additional Medicare IM Given:    Additional Medicare Important Message give by:     If discussed at [place] of Stay Meetings, dates discussed:    Additional Comments: CM met with pt to confirm address and contact information and to confirm [place] this evening.  CM copied and then faxed [facility] feeding supply/order and IV ABX orders to [facility] pharmacy with request for [facility] this evening at 20:00 for both tube feedings and IV ABX administration.  [facility] rep, Tiffany aware and arranging [place].  No other CM needs were communicated. Dellie Catholic, RN 05/06/2015, 2:54 PM

## 2015-05-06 NOTE — Progress Notes (Addendum)
Occupational Therapy Treatment Patient Details Name: [name] MRN: [id number] DOB: 07-15-1946 Today's Date: 05/06/2015    History of present illness [name] is a 69 y.o. female who was brought to the emergency department after having hip pain secondary to her third prosthetic hip joint dislocation in the recent past, discitis C3-4 and abcess C2 - T1 (s/p plate removal, I&D, and drain placements. Open gastrostomy w/ tube placement 05/04/15 due to esophageal fistula. PMHx: of CAD, status post MI, DM 2 diabetes, COPD, tobacco abuse disorder, EtOH abuse, hypertension, hyperlipidemia, history of breast cancer.     OT comments  This 69 yo female admitted and underwent above presents to acute OT with making progress with BADLs. Needs continued follow up of OT at home. Pt showing some safety issues here in hospital with trying to maneuver around with RW and 2 IV poles--luckily she more than likely will not have an IV pole at home or if she does, maybe only 1 for tube feeds.  Follow Up Recommendations  Home health OT;Supervision/Assistance - 24 hour    Equipment Recommendations  None recommended by OT       Precautions / Restrictions Precautions Precautions: Posterior Hip;Cervical Required Braces or Orthoses: Knee Immobilizer - Right Knee Immobilizer - Right: On at all times (to help prevent further dislocations) Restrictions Weight Bearing Restrictions: No RLE Weight Bearing: Weight bearing as tolerated       Mobility Bed Mobility Overal bed mobility: Needs Assistance Bed Mobility: Supine to Sit;Sit to Supine     Supine to sit: Supervision Sit to supine: Supervision      Transfers Overall transfer level: Needs assistance Equipment used: Rolling walker (2 wheeled) Transfers: Sit to/from Stand Sit to Stand: Supervision                  ADL Overall ADL's : Needs assistance/impaired                         Toilet Transfer:  Supervision/safety;Ambulation;RW;BSC (in bathroom)   Toileting- Clothing Manipulation and Hygiene: Supervision/safety;Sit to/from stand                          Cognition   Behavior During Therapy: Agitated;Impulsive (agitated due to having called for A x2 and no one came to A her to bathroom) Overall Cognitive Status:  (Decreased safety awareness in regards to getting up to bathroom with RW and 2 IV poles)                                    Pertinent Vitals/ Pain       Pain Assessment: No/denies pain  Home Living Family/patient expects to be discharged to:: Private residence Living Arrangements: Spouse/significant other   Type of Home: House Home Access: Stairs to enter Secretary/administrator of Steps: 1                                  Frequency Min 2X/week     Progress Toward Goals  OT Goals(current goals can now be found in the care plan section)  Progress towards OT goals: Progressing toward goals     Plan Discharge plan remains appropriate       End of Session Equipment Utilized During Treatment: Rolling walker;Right knee immobilizer (pt reported  she did not need to have cervical collar on)   Activity Tolerance Patient tolerated treatment well   Patient Left in bed;with call bell/phone within reach (could not get bed alarm to set)   Nurse Communication  (NT as well: found pt up in doorway of bathroom trying to manage 2 IV poles and her RW due to saying that she called 2 times and no one came to help; At end of session bed alarm not set due to could not get it to set (tried unplugging and plugging bed back in and still could not get it to work). I made NT and nurse (Ginger) aware.        Time: 4098-1191 OT Time Calculation (min): 10 min  Charges: OT General Charges $OT Visit: 1 Procedure OT Treatments $Self Care/Home Management : 8-22 mins  Evette Georges [phone] 05/06/2015, 9:02 AM

## 2015-05-06 NOTE — Discharge Summary (Signed)
Physician Discharge Summary  ANALY STADLER PPI:[id number] DOB: Jul 24, 1946 DOA: 04/26/2015  PCP: Redmond Baseman, MD  Admit date: 04/26/2015 Discharge date: 05/06/2015  Recommendations for Outpatient Follow-up:  1. Pt will need to follow up with PCP in 2 weeks post discharge 2. Please obtain BMP and CBC every 7 days x 6 weeks starting 05/09/15  Discharge Diagnoses:  Spondylodiskitis of cervical spine -04/30/15 MR of C-spine--diskitis and osteo of C3-4 and abnormal edema of retropharyngeal tissues with fluid collection anterior to C7 05/03/15--I&D with removal of hardware--Dr. Sheela Stack -abx stopped prior to surgery to maximize cultures-->restart vanc and merrem post op -abx per ID -anticipate 6-8 weeks IV abx -case discussed with Dr. Shon Baton -await final culture--polymicrobial without MRSA -d/c vanco -home with Merrem 1 gram TID through 06/14/15 which is 6 weeks from date of I&D--follow up Dr. Orvan Falconer 3-4 weeks Esophageal perforation -appreciate Dr. Lazarus Salines -8/816 esophagascopy--12mm posterior wall perforation -npo for at least 1 month, then repeat esophagram -PEG placed on 05/04/15 -Appreciate general surgery--case discussed with Dr. Corliss Skains  -Jevity 1.2 was started and increased to the patient's goal rate. The patient tolerated it -She will go home with Jevity 1.2---2 cans TID with free water 60cc before and after each 2 cans TID Dislocation of the right hip prosthesis -Seen by orthopedics.  -had a closed reduction on the morning of 8/12. Management per orthopedics.   chronic low back pain/Chronic Pain Syndrome -Noted to be on long-acting narcotics at home.  -will not be able to use MS Contin with PEG -05/04/15--I spoke with her pain management team, Allen Derry, PA-C-->he recommended start Fentanyl 25 mcg transdermal -scheduled follow up appointment on 05/08/15 at 145PM with Dr. Sheran Luz who is the patient's pain management physician -D/C IV Morphine -use oxycodone  liquid during the hospitalization--she will have to crush oxyIR tabs when she goes home Abnormal appearing gallbladder on CT scan LFTs are unremarkable. Patient remains nontender in the right upper quadrant. Ultrasound is as above. Gallbladder distention and minimal ductal dilatation was appreciated. But no cholecystitis identified. In view of the fact that her LFTs are unremarkable and the patient is asymptomatic, do not anticipate further evaluation currently. This can be addressed in the future with outpatient general surgery follow-up.  Hyponatremia,  -chronic dating back to 08/2014--127-132 -monitor off diuretics Remained stable throughout the hospitalization- Hypertension -restart losartan per PEG - Hold furosemide--restart after discharge -elevation partly from pain hypokalemia, hypomagnesemia -repleted  Normocytic anemia Continue to monitor hemoglobin. Some drop in the hemoglobin is likely due to dilution. -baseline Hgb 10-11  History of coronary artery disease Stable. Continue to monitor. -continue ASA  History of COPD/tobacco abuse Continue nebulizer treatments. Inhalers. -Presently stable on 2 L nasal cannula -Pulmonary hygiene -Tobacco cessation discussed  History of diabetes mellitus type 2 -Sliding scale insulin coverage. Holding her oral agents. -CBGs controlled -Restart Januvia after discharge  Discharge Condition: Stable  Disposition: Home Follow-up Information    Follow up with [facility].   Why:  Someone from [facility] will contact you concerning start date for therapy.   Contact information:   [address] [place] [address] [phone]       Follow up with Wynona Luna., MD. Schedule an appointment as soon as possible for a visit in 2 weeks.   Specialty:  General Surgery   Why:  For suture removal, For wound re-check   Contact information:   [address] [place]  [address] [phone]  Follow up with RAMOS,RICHARD D, MD.   Specialty:  Physical Medicine and Rehabilitation   Why:  05/08/15@145PM    Contact information:   [address] [place] [address] [phone]       Follow up with Cliffton Asters, MD In 3 weeks.   Specialty:  Infectious Diseases   Contact information:   [address] [place] [address] [phone]       Follow up with Flo Shanks, MD In 3 weeks.   Specialty:  Otolaryngology   Contact information:   [address] [place] [address] [phone]       Diet:  Jevity 1.2--2 cans TID Wt Readings from Last 3 Encounters:  05/06/15 57.3 kg (126 lb 5.2 oz)  03/26/15 60.328 kg (133 lb)  03/13/15 62.87 kg (138 lb 9.7 oz)    History of present illness:  69 year old Caucasian female with a past medical history of coronary artery disease, insulin-dependent diabetes, COPD, history of breast cancer presented with right hip pain. She was noted to have a right hip prosthesis dislocation. She was also noted to have a fever. She was recently treated for pneumonia with Levaquin. CT scan of the chest revealed fluid collection in the lower neck and upper chest area. Patient was hospitalized for further management. She had a closed reduction of her hip dislocation. 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 There was concern for esophageal perforation. ENT was consulted. Orthopedics has been involved. The patient was transferred to [facility] for removal of hardware, irrigation and debridement and esophagoscopy.  The esophagoscopy revealed esophageal perforation. The patient was made nothing by mouth. Gastrostomy tube was placed. The patient will remain nothing by mouth for at least 1 month with repeat esophagram to reevaluate at that time. The patient will go  home with enteral feedings through her gastrostomy tube. Cultures were polymicrobial from surgery. Vancomycin was stopped. The patient was continued on meropenem. The patient will continue on IV antibiotics through her PICC line through 06/14/2015 which is 6 weeks from the date of debridement and removal of hardware.   Consultants: ENT--[facility] ID--[facility] Deno Lunger [facility]rie [facility]  Discharge Exam: Filed Vitals:   05/06/15 0626  BP: 162/80  Pulse: 69  Temp: 97.3 F (36.3 C)  Resp: 18   Filed Vitals:   05/05/15 1924 05/05/15 2237 05/06/15 0219 05/06/15 0626  BP: 155/81   162/80  Pulse: 74   69  Temp: 98.3 F (36.8 C)   97.3 F (36.3 C)  TempSrc: Oral   Oral  Resp: 17   18  Height:      Weight:  57.3 kg (126 lb 5.2 oz) 57.3 kg (126 lb 5.2 oz)   SpO2: 96%   99%   General: A&O x 3, NAD, pleasant, cooperative Cardiovascular: RRR, no rub, no gallop, no S3 Respiratory: CTAB, no wheeze, no rhonchi Abdomen:soft, nontender, nondistended, positive bowel sounds Extremities: No edema, No lymphangitis, no petechiae  Discharge Instructions      Discharge Instructions    Diet - low sodium heart healthy    Complete by:  As directed      Discharge instructions    Complete by:  As directed   Tube feeding--Jevity--give 2 cans through tube 3 times daily Free water--give 60 mL water before Jevity AND after Jevity--3 times daily Follow up with Dr. Ethelene Hal 05/08/15 @  145PM     Increase activity slowly    Complete by:  As directed             Medication List    STOP taking these medications        diclofenac 75 MG EC tablet  Commonly known as:  VOLTAREN     morphine 30 MG 12 hr tablet  Commonly known as:  MS CONTIN     nitroGLYCERIN 0.4 MG SL tablet  Commonly known as:  NITROSTAT      TAKE these medications        albuterol (2.5 MG/3ML) 0.083% nebulizer solution  Commonly known as:  PROVENTIL  Take 2.5 mg by nebulization every 6 (six) hours as needed.      albuterol 108 (90 BASE) MCG/ACT inhaler  Commonly known as:  PROVENTIL HFA;VENTOLIN HFA  Inhale 2-4 puffs into the lungs every 6 (six) hours as needed for wheezing (wheezing).     ALPRAZolam 0.25 MG tablet  Commonly known as:  XANAX  Take one tablet daily or as needed     aspirin EC 81 MG tablet  Take 1 tablet (81 mg total) by mouth daily.     ATROVENT HFA IN  Inhale 17 mcg into the lungs at bedtime.     feeding supplement (JEVITY 1.2 CAL) Liqd  Give 2 cans three times daily via PEG     fentaNYL 25 MCG/HR patch  Commonly known as:  DURAGESIC - dosed mcg/hr  Place 1 patch (25 mcg total) onto the skin every 3 (three) days.     free water Soln  Place 60 mLs into feeding tube every 8 (eight) hours. Flush BEFORE AND AFTER EACH JEVITY ADMINISTRATION     furosemide 40 MG tablet  Commonly known as:  LASIX  Take 1 tablet (40 mg total) by mouth 2 (two) times daily.     gabapentin 600 MG tablet  Commonly known as:  NEURONTIN  Take 600 mg by mouth 3 (three) times daily.     JANUVIA 25 MG tablet  Generic drug:  sitaGLIPtin  Take 12.5 mg by mouth daily.     losartan 25 MG tablet  Commonly known as:  COZAAR  Take 25 mg by mouth daily.     meropenem 1 g in sodium chloride 0.9 % 100 mL  Inject 1 g into the vein every 8 (eight) hours. Last dose on 06/14/15     oxyCODONE 15 MG immediate release tablet  Commonly known as:  ROXICODONE  Take 15 mg by mouth every 6 (six) hours as needed for pain.     simvastatin 10 MG tablet  Commonly known as:  ZOCOR  Take 10 mg by mouth every evening.     tiZANidine 4 MG tablet  Commonly known as:  ZANAFLEX  Take 4 mg by mouth every 8 (eight) hours as needed for muscle spasms.         The results of significant diagnostics from this hospitalization (including imaging, microbiology, ancillary and laboratory) are listed below for reference.    Significant Diagnostic Studies: Dg Cervical Spine 1 View  05/03/2015   CLINICAL DATA:  Cervical  abscess and hardware removal  EXAM: DG CERVICAL SPINE - 1 VIEW  COMPARISON:  MRI cervical spine dated 04/30/2015  FINDINGS: Single lateral view of the cervical spine.  Surgical clamp is partially obscured by the patient's shoulders and likely marks the upper thoracic spine, possibly the inferior margin of T1, although poorly visualized.  The C2  through C7 vertebral bodies are marked on the intraoperative image.  IMPRESSION: Intraoperative cervical localization as above.   Electronically Signed   By: Charline Bills M.D.   On: 05/03/2015 09:52   Ct Soft Tissue Neck W Contrast  04/27/2015   CLINICAL DATA:  Paravertebral abscess. Abnormal CT chest yesterday. Cervical fusion 2010  EXAM: CT NECK WITH CONTRAST  TECHNIQUE: Multidetector CT imaging of the neck was performed using the standard protocol following the bolus administration of intravenous contrast.  CONTRAST:  OMNIPAQUE IOHEXOL 300 MG/ML  SOLN  COMPARISON:  CT chest 04/26/2015  FINDINGS: Pharynx and larynx: Normal tongue and oropharynx. Normal larynx. Extensive prevertebral soft tissue swelling and gas bubbles. Prevertebral swelling and fluid begins at approximately C2 and extends to T1. Rim enhancing fluid collection to the left of T1 compatible with abscess as noted on chest CT.  Salivary glands: Negative  Thyroid: Negative  Lymph nodes: Negative for adenopathy.  Vascular: Carotid artery patent bilaterally with mild atherosclerotic disease. Jugular vein patent bilaterally without thrombus.  Limited intracranial: Negative  Visualized orbits: Not imaged  Mastoids and visualized paranasal sinuses: Clear  Skeleton: ACDF C4 through C6. Anterior plate in good position. Solid fusion with strut graft. No bony destruction at these levels. There is spondylosis at C4-5 causing spinal and foraminal stenosis. There is also foraminal encroachment bilaterally at C5-6 due to spurring.  4 mm retrolisthesis C3-4 with disc degeneration and spurring. Severe spinal  stenosis. Endplates are irregular. Osteomyelitis and discitis not excluded at this level as a cause for infection. Cervical spine MRI would be helpful to evaluate for spinal infection and to exclude epidural abscess.  Upper chest: 6 mm right upper lobe nodule again noted and unchanged. Small left pleural effusion.  IMPRESSION: Extensive prevertebral edema and fluid mixed with gas bubbles. This is anterior to anterior plate fusion of C4 through C7. This is most likely arising from the spine. The infection could be arising from an infected fusion plate or possibly from discitis and osteomyelitis at C3-4. There is endplate irregularity at C3-4 with prominent posterior osteophyte formation. There is severe spinal stenosis at C3-4 and moderate to severe spinal stenosis at C4-5 due to spurring. 15 mm abscess to the left of T1.  MRI cervical spine without and with contrast suggested to exclude osteomyelitis of the spine and rule out epidural abscess.  These results were called by telephone at the time of interpretation on 04/27/2015 at 9:18 am to Dr. Flo Shanks , who verbally acknowledged these results.   Electronically Signed   By: Marlan Palau M.D.   On: 04/27/2015 09:08   Ct Chest W Contrast  04/26/2015   CLINICAL DATA:  Fever and cough.  Evaluate for pneumonia.  EXAM: CT CHEST WITH CONTRAST  TECHNIQUE: Multidetector CT imaging of the chest was performed during intravenous contrast administration.  CONTRAST:  80mL OMNIPAQUE IOHEXOL 300 MG/ML  SOLN  COMPARISON:  Chest radiograph 04/04/2015.  Chest CT 08/29/2014  FINDINGS: There is abnormal soft tissue in the lower neck along the left side of the thyroid gland and surrounding the upper esophagus. There is a small focal low-density structure along the left anterior aspect of C7 and T1. This collection has a small focus of gas within it and measures 1.1 x 0.9 x 2.0 cm. This collection is compatible with a soft tissue abscess. There is no significant fat plane between  the proximal esophagus and the soft tissue abscess. Soft tissues in the left subcarinal region and distal esophageal  region are indistinct. Difficult to exclude inflammatory changes around the distal esophageal region, particularly on sequence 2, image 31.  There are coronary artery calcifications. The gallbladder is distended and there is at least mild intrahepatic biliary dilatation. Limited evaluation of the anterior right liver due to artifact and motion. There may be abnormal enhancement along the anterior right hepatic lobe. In addition, there are slightly prominent lymph nodes in the upper abdomen and the left periaortic region. Some of these nodes were present on the exam from 2012.  The trachea and mainstem bronchi are patent. There appears chronic or recurrent filling defects in the left lower lobe bronchus and left lower lobe bronchi. There is chronic volume loss in the left lower lobe. Chronic patchy parenchymal densities in left lower lobe. Linear densities in medial right lower lobe could represent scar or atelectasis. There is a chronic of sub solid nodular structure in the anterior right upper lobe on sequence 4, image 18 measuring 5 mm. This has not significantly changed since 2012.  There is an anterior surgical plate in the cervical spine extending down to C6. There is no evidence for bone destruction involving the lower cervical spine or upper thoracic spine adjacent to the area of inflammation and abscess collection.  IMPRESSION: There is abnormal soft tissue in the lower neck and upper posterior mediastinum which likely represents edema and an inflammatory process. There is a small air-fluid collection along the left side of the cervical-thoracic junction which is compatible with an abscess. There is no clear evidence for bone destruction. The source for this inflammatory process is unclear. Upper esophagus is poorly characterized.  There are endobronchial filling defects in the left lower lobe  airways. Similar findings were present on prior examinations and this could represent an area of recurrent mucous plugging or aspiration. There is some chronic volume loss in the left lower lobe.  The soft tissue along the mid and distal esophagus is indistinct. Difficult to exclude an inflammatory process in this region.  Gallbladder distension with at least mild intrahepatic biliary dilatation. Difficult to exclude abnormal enhancement along the anterior right hepatic lobe. This area could be further characterized with ultrasound.  Critical Value/emergent results were called by telephone at the time of interpretation on 04/26/2015 at 7:47 pm to Dr. Elwin Mocha , who verbally acknowledged these results.   Electronically Signed   By: Richarda Overlie M.D.   On: 04/26/2015 20:03   Mr Cervical Spine W Wo Contrast  04/30/2015   CLINICAL DATA:  Followup C3-4 infection  EXAM: MRI CERVICAL SPINE WITHOUT AND WITH CONTRAST  TECHNIQUE: Multiplanar and multiecho pulse sequences of the cervical spine, to include the craniocervical junction and cervicothoracic junction, were obtained according to standard protocol without and with intravenous contrast.  CONTRAST:  11mL MULTIHANCE GADOBENATE DIMEGLUMINE 529 MG/ML IV SOLN  COMPARISON:  MRI 3 days ago.  CT 3 days ago.  FINDINGS: No change by imaging at this point. The foramen magnum is widely patent. C1-2 and C2-3 are unremarkable.  C3-4: Findings consistent with discitis osteomyelitis at this level. Retrolisthesis of 2 mm. Endplate osteophytes. Narrowing of the canal with AP diameter of 5 mm. Effacement of the subarachnoid space surrounding the cord. Slight deformity of the cord. No cord edema. Some epidural enhancement at this level consistent with epidural inflammation but no evidence of compressive epidural abscess or fluid collection.  Previous fusion from C4 through C6 with strut graft. Sufficient patency of the canal through that region. No epidural collection.  C6-7: Facet  degeneration with 1 or 2 mm of anterolisthesis. No compressive canal stenosis. Mild foraminal narrowing bilaterally.  C7-T1: Abnormal edema and enhancement of the C7 vertebral body worrisome for osteomyelitis at this level as well. Abnormal fluid anterior and to the left of the C7 vertebral body consistent with adjacent soft tissue abscess.  IMPRESSION: No worrisome intraspinal change since the study of 3 days ago. Continuing evidence of discitis osteomyelitis at C3-4. Spinal canal narrowing with an AP diameter of 5 mm. To some deformity of the cord without cord edema. Epidural enhancement without evidence of drainable epidural collection.  Abnormal edema and enhancement of the C7 vertebral body consistent with osteomyelitis of that level as well.  Abnormal edema in the retropharyngeal tissues including a fluid collection anterior and to the left of the C7 vertebral body as seen previously.   Electronically Signed   By: Paulina Fusi M.D.   On: 04/30/2015 10:56   Mr Cervical Spine W Wo Contrast  04/27/2015   CLINICAL DATA:  Neck abscess. Dysphagia. Rule out discitis and osteomyelitis. Cervical fusion 2010.  EXAM: MRI CERVICAL SPINE WITHOUT AND WITH CONTRAST  TECHNIQUE: Multiplanar and multiecho pulse sequences of the cervical spine, to include the craniocervical junction and cervicothoracic junction, were obtained according to standard protocol without and with intravenous contrast.  CONTRAST:  12mL MULTIHANCE GADOBENATE DIMEGLUMINE 529 MG/ML IV SOLN  COMPARISON:  CT neck 04/27/2015  FINDINGS: The patient was not able to hold still. Image quality degraded by moderate motion.  ACDF with anterior plate C4 through C6. Fusion appears solid. No significant enhancement related to the fusion. Moderate to severe spinal stenosis at C4-5 due to spurring. Marked foraminal encroachment bilaterally at C4-5. Mild to moderate spinal and moderate foraminal stenosis bilaterally at C5-6.  There is abnormal bone marrow edema and  enhancement throughout C3 and C4. The disc space is ill-defined and appears eroded. There is enhancement of the ventral epidural soft tissues which may represent infection/epidural abscess. There is posterior osteophyte formation and severe spinal stenosis with cord compression. Possible mild cord edema. There is also edema and enhancement involving the posterior elements of C3 which may be related to infection as well.  Extensive prevertebral soft tissue swelling from C2 through T1. Gas bubbles are noted in the prevertebral tissues on CT. 15 mm epidural abscess is present to the left of T1, better seen on the prior CT.  IMPRESSION: Extensive prevertebral soft tissue edema with gas bubbles in the tissues consistent with infection. 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 Enhancement extends into the posterior elements of C3 which may also be infected. There is ventral epidural enhancement which may be due to abscess/ phlegmon. There is severe spinal stenosis and cord compression at C3-4. Cervical fusion at C4-5 and C5-6. Edema and enhancement extends into the C4 vertebral body which could be infected. Evidence of abnormal enhancement at C5 or C6. Moderate to severe spinal stenosis at C4-5 and mild to moderate spinal stenosis at C5-6.  15 mm epidural abscess to the left of T1, better seen on the prior CT.  These results were called by telephone at the time of interpretation on 04/27/2015 at 3:00 pm to Dr. Flo Shanks , who verbally acknowledged these results.   Electronically Signed   By: Marlan Palau M.D.   On: 04/27/2015 15:03   Dg Pelvis Portable  04/26/2015   CLINICAL DATA:  Right hip dislocation.  EXAM: PORTABLE PELVIS 1-2 VIEWS  COMPARISON:  03/26/2015  FINDINGS: The right hip prosthesis is dislocated with the femoral component displacing superior to the acetabular component. The prosthetic components remain well seated. There is no acute fracture.  Left hip prosthesis is well-seated and  aligned.  Bones are demineralized.  IMPRESSION: Dislocated right hip prosthesis. No prosthetic loosening or evidence of a fracture.   Electronically Signed   By: Amie Portland M.D.   On: 04/26/2015 17:00   Dg Hip Port Unilat With Pelvis 1v Right  04/26/2015   CLINICAL DATA:  69 year old female with right hip arthroplasty complicated by recent right hip dislocation status post right hip reduction.  EXAM: DG HIP (WITH OR WITHOUT PELVIS) 1V PORT RIGHT  COMPARISON:  04/26/2015.  FINDINGS: Postoperative changes of right total hip arthroplasty again noted. The prosthetic femoral head has been successfully relocated within the prosthetic acetabulum. No periprosthetic fractures are identified. Iodinated contrast material in the urinary bladder incidentally noted, presumably related to recent contrast enhanced chest CT.  IMPRESSION: 1. Successful relocation of right hip, without acute complicating features.   Electronically Signed   By: Trudie Reed M.D.   On: 04/26/2015 20:19   US Abdomen Limited Ruq  04/27/2015   CLINICAL DATA:  Cholecystitis, incidental finding of gallbladder distension on recent emergency room workup  EXAM: US ABDOMEN LIMITED - RIGHT UPPER QUADRANT  COMPARISON:  None.  FINDINGS: Gallbladder:  Distended gallbladder. Echogenic foci along the gallbladder wall without shadowing which may reflect gallbladder sludge. Unable to place the patient in decubitus position. No gallbladder wall thickening or pericholecystic fluid. No sonographic Murphy sign noted.  Common bile duct:  Diameter: 5.7 mm  Liver:  No focal hepatic mass. Mild intrahepatic biliary ductal dilatation. Small amount of perihepatic fluid. Otherwise normal hepatic echogenicity.  IMPRESSION: 1. Gallbladder distention and mild intrahepatic biliary ductal dilatation. No sonographic findings to suggest acute cholecystitis. 2. Small amount of echogenic material in the gallbladder dependently which may reflect gallbladder sludge. 3. Small  amount of perihepatic fluid.   Electronically Signed   By: Elige Ko   On: 04/27/2015 09:35   Dg Esophagus W/water Sol Cm  04/30/2015   CLINICAL DATA:  Postop from cervical spine fusion. Severe neck pain and cervical spine osteomyelitis. Evaluate for esophageal perforation.  EXAM: ESOPHOGRAM/BARIUM SWALLOW  TECHNIQUE: Single contrast examination was performed using 50 mL water-soluble Omnipaque 300.  FLUOROSCOPY TIME:  Fluoroscopy Time:  2 minutes 52 seconds  Number of Acquired Images:  7  COMPARISON:  None.  FINDINGS: Exam was technically difficult due to severe patient pain, limited mobility, and limited ability to cooperate with positioning.  Anterior cervical spine fixation hardware is noted. There is no evidence of esophageal contrast leak or perforation. No evidence of esophageal obstruction or dilatation.  IMPRESSION: Technically difficult exam. No evidence of esophageal leak or perforation.   Electronically Signed   By: Myles Rosenthal M.D.   On: 04/30/2015 12:34     Microbiology: Recent Results (from the past 240 hour(s))  Blood Culture (routine x 2)     Status: None   Collection Time: 04/26/15  5:27 PM  Result Value Ref Range Status   Specimen Description BLOOD LEFT FOREARM  Final   Special Requests BOTTLES DRAWN AEROBIC AND ANAEROBIC 5CC  Final   Culture   Final    NO GROWTH 5 DAYS Performed at [facility]    Report Status 05/01/2015 FINAL  Final  Blood Culture (routine x 2)     Status: None   Collection Time: 04/26/15  5:29  PM  Result Value Ref Range Status   Specimen Description BLOOD LEFT ANTECUBITAL  Final   Special Requests BOTTLES DRAWN AEROBIC AND ANAEROBIC 5CC  Final   Culture   Final    NO GROWTH 5 DAYS Performed at [facility]    Report Status 05/01/2015 FINAL  Final  Urine culture     Status: None   Collection Time: 04/26/15  5:48 PM  Result Value Ref Range Status   Specimen Description URINE, CLEAN CATCH  Final   Special Requests NONE  Final    Culture   Final    NO GROWTH 2 DAYS Performed at [facility]    Report Status 04/28/2015 FINAL  Final  MRSA PCR Screening     Status: None   Collection Time: 04/27/15 12:25 AM  Result Value Ref Range Status   MRSA by PCR NEGATIVE NEGATIVE Final    Comment:        The GeneXpert MRSA Assay (FDA approved for NASAL specimens only), is one component of a comprehensive MRSA colonization surveillance program. It is not intended to diagnose MRSA infection nor to guide or monitor treatment for MRSA infections.   C difficile quick scan w PCR reflex     Status: None   Collection Time: 04/28/15  3:10 AM  Result Value Ref Range Status   C Diff antigen NEGATIVE NEGATIVE Final   C Diff toxin NEGATIVE NEGATIVE Final   C Diff interpretation Negative for toxigenic C. difficile  Final  Surgical pcr screen     Status: None   Collection Time: 05/03/15  5:17 AM  Result Value Ref Range Status   MRSA, PCR NEGATIVE NEGATIVE Final   Staphylococcus aureus NEGATIVE NEGATIVE Final    Comment:        The Xpert SA Assay (FDA approved for NASAL specimens in patients over 74 years of age), is one component of a comprehensive surveillance program.  Test performance has been validated by [facility] for patients greater than or equal to 6 year old. It is not intended to diagnose infection nor to guide or monitor treatment.   Anaerobic culture     Status: None (Preliminary result)   Collection Time: 05/03/15  8:36 AM  Result Value Ref Range Status   Specimen Description ABSCESS  Final   Special Requests SPEC A RETAINED HARDWARE AND CERVICAL ABSCESS  Final   Gram Stain PENDING  Incomplete   Culture   Final    NO ANAEROBES ISOLATED; CULTURE IN PROGRESS FOR 5 DAYS Performed at [facility]    Report Status PENDING  Incomplete  Gram stain     Status: None   Collection Time: 05/03/15  8:36 AM  Result Value Ref Range Status   Specimen Description ABSCESS  Final   Special Requests  SPEC A RETAINED HARDWARE AND CERVICAL ABSCESS  Final   Gram Stain   Final    FEW WBC PRESENT,BOTH PMN AND MONONUCLEAR NO ORGANISMS SEEN    Report Status 05/03/2015 FINAL  Final  Culture, routine-abscess     Status: None   Collection Time: 05/03/15  8:36 AM  Result Value Ref Range Status   Specimen Description ABSCESS  Final   Special Requests SPEC A RETAINED HARDWARE AND CERVICAL ABCESS  Final   Gram Stain   Final    RARE WBC PRESENT,BOTH PMN AND MONONUCLEAR NO SQUAMOUS EPITHELIAL CELLS SEEN NO ORGANISMS SEEN Performed at [facility]   Final  MULTIPLE ORGANISMS PRESENT, NONE PREDOMINANT Note: NO STAPHYLOCOCCUS AUREUS ISOLATED NO GROUP A STREP (S.PYOGENES) ISOLATED Performed at [facility]    Report Status 05/06/2015 FINAL  Final  Anaerobic culture     Status: None (Preliminary result)   Collection Time: 05/03/15  8:42 AM  Result Value Ref Range Status   Specimen Description ABSCESS  Final   Special Requests SPEC B NECK ABCESS C3  Final   Gram Stain   Final    NO WBC SEEN NO SQUAMOUS EPITHELIAL CELLS SEEN NO ORGANISMS SEEN Performed at [facility]    Culture   Final    NO ANAEROBES ISOLATED; CULTURE IN PROGRESS FOR 5 DAYS Performed at [facility]    Report Status PENDING  Incomplete  Gram stain     Status: None   Collection Time: 05/03/15  8:42 AM  Result Value Ref Range Status   Specimen Description ABSCESS  Final   Special Requests SPEC B NECK ABCESS C3  Final   Gram Stain   Final    FEW WBC PRESENT,BOTH PMN AND MONONUCLEAR NO ORGANISMS SEEN    Report Status 05/03/2015 FINAL  Final  Culture, routine-abscess     Status: None   Collection Time: 05/03/15  8:42 AM  Result Value Ref Range Status   Specimen Description ABSCESS  Final   Special Requests SPEC B NECK ABCESS C3  Final   Gram Stain   Final    NO WBC SEEN NO SQUAMOUS EPITHELIAL CELLS SEEN NO ORGANISMS SEEN Performed at [facility]    Culture    Final    MULTIPLE ORGANISMS PRESENT, NONE PREDOMINANT Note: NO STAPHYLOCOCCUS AUREUS ISOLATED NO GROUP A STREP (S.PYOGENES) ISOLATED Performed at [facility]    Report Status 05/06/2015 FINAL  Final  Gram stain     Status: None   Collection Time: 05/03/15  9:04 AM  Result Value Ref Range Status   Specimen Description TISSUE  Final   Special Requests PHLEGMA FROM BEHIND EXPLATED PLATE  Final   Gram Stain   Final    FEW WBC PRESENT,BOTH PMN AND MONONUCLEAR NO ORGANISMS SEEN    Report Status 05/03/2015 FINAL  Final  Anaerobic culture     Status: None (Preliminary result)   Collection Time: 05/03/15  9:05 AM  Result Value Ref Range Status   Specimen Description TISSUE  Final   Special Requests PHLEGMA FROM BEHIND EXPLATED PLATE  Final   Gram Stain PENDING  Incomplete   Culture   Final    NO ANAEROBES ISOLATED; CULTURE IN PROGRESS FOR 5 DAYS Performed at [facility]    Report Status PENDING  Incomplete  Tissue culture     Status: None   Collection Time: 05/03/15  9:05 AM  Result Value Ref Range Status   Specimen Description TISSUE  Final   Special Requests PHLEGMA FROM BEHIND EXPLATED PLATE  Final   Gram Stain   Final    NO WBC SEEN NO ORGANISMS SEEN Performed at [facility]    Culture   Final    NO GROWTH 3 DAYS Performed at [facility]    Report Status 05/06/2015 FINAL  Final     Labs: Basic Metabolic Panel:  Recent Labs Lab 04/30/15 0405 05/01/15 0516 05/02/15 0541 05/04/15 0410 05/05/15 0500 05/06/15 0527  NA 133* 133* 133* 130* 132* 133*  K 3.5 3.4* 4.0 3.7 3.1* 3.2*  CL 99* 99* 101 98* 96* 97*  CO2 26 26  27 23 28 31   GLUCOSE 130* 111* 106* 112* 122* 164*  BUN <5* 5* 7 6 <5* <5*  CREATININE 0.32* 0.33* 0.35* 0.38* 0.38* 0.35*  CALCIUM 8.3* 8.2* 8.4* 8.3* 8.2* 8.5*  MG 1.6*  --  1.7  --  1.8 1.9   Liver Function Tests: No results for input(s): AST, ALT, ALKPHOS, BILITOT, PROT, ALBUMIN in the last 168 hours. No  results for input(s): LIPASE, AMYLASE in the last 168 hours. No results for input(s): AMMONIA in the last 168 hours. CBC:  Recent Labs Lab 05/01/15 0516 05/02/15 0541 05/04/15 0410 05/05/15 0500  WBC 11.0* 9.9 10.8* 9.7  HGB 11.5* 11.0* 10.9* 11.1*  HCT 34.5* 33.7* 32.9* 33.8*  MCV 81.0 82.2 81.6 81.4  PLT 291 294 313 362   Cardiac Enzymes: No results for input(s): CKTOTAL, CKMB, CKMBINDEX, TROPONINI in the last 168 hours. BNP: Invalid input(s): POCBNP CBG:  Recent Labs Lab 05/05/15 2235 05/06/15 0024 05/06/15 0631 05/06/15 0825 05/06/15 1141  GLUCAP 151* 116* 155* 145* 145*    Time coordinating discharge:  Greater than 30 minutes  Signed:  Vishwa Dais, DO [facility] Pager: [phone] 05/06/2015, 1:07 PM

## 2015-05-07 DIAGNOSIS — J853 Abscess of mediastinum: Secondary | ICD-10-CM | POA: Diagnosis not present

## 2015-05-07 DIAGNOSIS — Z452 Encounter for adjustment and management of vascular access device: Secondary | ICD-10-CM | POA: Diagnosis not present

## 2015-05-07 DIAGNOSIS — Z434 Encounter for attention to other artificial openings of digestive tract: Secondary | ICD-10-CM | POA: Diagnosis not present

## 2015-05-07 DIAGNOSIS — M4622 Osteomyelitis of vertebra, cervical region: Secondary | ICD-10-CM | POA: Diagnosis not present

## 2015-05-07 DIAGNOSIS — T849XXD Unspecified complication of internal orthopedic prosthetic device, implant and graft, subsequent encounter: Secondary | ICD-10-CM | POA: Diagnosis not present

## 2015-05-07 DIAGNOSIS — T84020D Dislocation of internal right hip prosthesis, subsequent encounter: Secondary | ICD-10-CM | POA: Diagnosis not present

## 2015-05-08 DIAGNOSIS — G894 Chronic pain syndrome: Secondary | ICD-10-CM | POA: Diagnosis not present

## 2015-05-08 DIAGNOSIS — M5136 Other intervertebral disc degeneration, lumbar region: Secondary | ICD-10-CM | POA: Diagnosis not present

## 2015-05-08 DIAGNOSIS — M542 Cervicalgia: Secondary | ICD-10-CM | POA: Diagnosis not present

## 2015-05-08 DIAGNOSIS — Z79891 Long term (current) use of opiate analgesic: Secondary | ICD-10-CM | POA: Diagnosis not present

## 2015-05-08 LAB — ANAEROBIC CULTURE
GRAM STAIN: NONE SEEN
Gram Stain: NONE SEEN
Gram Stain: NONE SEEN

## 2015-05-09 DIAGNOSIS — M4622 Osteomyelitis of vertebra, cervical region: Secondary | ICD-10-CM | POA: Diagnosis not present

## 2015-05-09 DIAGNOSIS — J853 Abscess of mediastinum: Secondary | ICD-10-CM | POA: Diagnosis not present

## 2015-05-09 DIAGNOSIS — T849XXD Unspecified complication of internal orthopedic prosthetic device, implant and graft, subsequent encounter: Secondary | ICD-10-CM | POA: Diagnosis not present

## 2015-05-09 DIAGNOSIS — Z452 Encounter for adjustment and management of vascular access device: Secondary | ICD-10-CM | POA: Diagnosis not present

## 2015-05-09 DIAGNOSIS — E119 Type 2 diabetes mellitus without complications: Secondary | ICD-10-CM | POA: Diagnosis not present

## 2015-05-09 DIAGNOSIS — J189 Pneumonia, unspecified organism: Secondary | ICD-10-CM | POA: Diagnosis not present

## 2015-05-09 DIAGNOSIS — I4891 Unspecified atrial fibrillation: Secondary | ICD-10-CM | POA: Diagnosis not present

## 2015-05-09 DIAGNOSIS — T84020D Dislocation of internal right hip prosthesis, subsequent encounter: Secondary | ICD-10-CM | POA: Diagnosis not present

## 2015-05-09 DIAGNOSIS — Z434 Encounter for attention to other artificial openings of digestive tract: Secondary | ICD-10-CM | POA: Diagnosis not present

## 2015-05-09 DIAGNOSIS — I5033 Acute on chronic diastolic (congestive) heart failure: Secondary | ICD-10-CM | POA: Diagnosis not present

## 2015-05-10 DIAGNOSIS — T849XXD Unspecified complication of internal orthopedic prosthetic device, implant and graft, subsequent encounter: Secondary | ICD-10-CM | POA: Diagnosis not present

## 2015-05-10 DIAGNOSIS — M4622 Osteomyelitis of vertebra, cervical region: Secondary | ICD-10-CM | POA: Diagnosis not present

## 2015-05-10 DIAGNOSIS — Z434 Encounter for attention to other artificial openings of digestive tract: Secondary | ICD-10-CM | POA: Diagnosis not present

## 2015-05-10 DIAGNOSIS — J853 Abscess of mediastinum: Secondary | ICD-10-CM | POA: Diagnosis not present

## 2015-05-10 DIAGNOSIS — T84020D Dislocation of internal right hip prosthesis, subsequent encounter: Secondary | ICD-10-CM | POA: Diagnosis not present

## 2015-05-10 DIAGNOSIS — Z452 Encounter for adjustment and management of vascular access device: Secondary | ICD-10-CM | POA: Diagnosis not present

## 2015-05-11 DIAGNOSIS — T84020D Dislocation of internal right hip prosthesis, subsequent encounter: Secondary | ICD-10-CM | POA: Diagnosis not present

## 2015-05-11 DIAGNOSIS — M4622 Osteomyelitis of vertebra, cervical region: Secondary | ICD-10-CM | POA: Diagnosis not present

## 2015-05-11 DIAGNOSIS — Z452 Encounter for adjustment and management of vascular access device: Secondary | ICD-10-CM | POA: Diagnosis not present

## 2015-05-11 DIAGNOSIS — T849XXD Unspecified complication of internal orthopedic prosthetic device, implant and graft, subsequent encounter: Secondary | ICD-10-CM | POA: Diagnosis not present

## 2015-05-11 DIAGNOSIS — Z434 Encounter for attention to other artificial openings of digestive tract: Secondary | ICD-10-CM | POA: Diagnosis not present

## 2015-05-11 DIAGNOSIS — J853 Abscess of mediastinum: Secondary | ICD-10-CM | POA: Diagnosis not present

## 2015-05-14 ENCOUNTER — Other Ambulatory Visit (HOSPITAL_COMMUNITY)
Admission: RE | Admit: 2015-05-14 | Discharge: 2015-05-14 | Disposition: A | Discharge status: Home / Self Care | Payer: Medicare Other | Source: Other Acute Inpatient Hospital | Attending: Cardiology | Admitting: Cardiology

## 2015-05-14 DIAGNOSIS — Z029 Encounter for administrative examinations, unspecified: Secondary | ICD-10-CM | POA: Diagnosis present

## 2015-05-14 DIAGNOSIS — J853 Abscess of mediastinum: Secondary | ICD-10-CM | POA: Diagnosis not present

## 2015-05-14 DIAGNOSIS — Z434 Encounter for attention to other artificial openings of digestive tract: Secondary | ICD-10-CM | POA: Diagnosis not present

## 2015-05-14 DIAGNOSIS — Z452 Encounter for adjustment and management of vascular access device: Secondary | ICD-10-CM | POA: Diagnosis not present

## 2015-05-14 DIAGNOSIS — M4622 Osteomyelitis of vertebra, cervical region: Secondary | ICD-10-CM | POA: Diagnosis not present

## 2015-05-14 DIAGNOSIS — T84020D Dislocation of internal right hip prosthesis, subsequent encounter: Secondary | ICD-10-CM | POA: Diagnosis not present

## 2015-05-14 DIAGNOSIS — T849XXD Unspecified complication of internal orthopedic prosthetic device, implant and graft, subsequent encounter: Secondary | ICD-10-CM | POA: Diagnosis not present

## 2015-05-14 LAB — BASIC METABOLIC PANEL
Anion gap: 5 (ref 5–15)
BUN: 16 mg/dL (ref 6–20)
CHLORIDE: 95 mmol/L — AB (ref 101–111)
CO2: 31 mmol/L (ref 22–32)
CREATININE: 0.31 mg/dL — AB (ref 0.44–1.00)
Calcium: 8.3 mg/dL — ABNORMAL LOW (ref 8.9–10.3)
Glucose, Bld: 136 mg/dL — ABNORMAL HIGH (ref 65–99)
Potassium: 4.6 mmol/L (ref 3.5–5.1)
SODIUM: 131 mmol/L — AB (ref 135–145)

## 2015-05-14 LAB — CBC
HCT: 30.6 % — ABNORMAL LOW (ref 36.0–46.0)
Hemoglobin: 10 g/dL — ABNORMAL LOW (ref 12.0–15.0)
MCH: 27.8 pg (ref 26.0–34.0)
MCHC: 32.7 g/dL (ref 30.0–36.0)
MCV: 85 fL (ref 78.0–100.0)
PLATELETS: 291 10*3/uL (ref 150–400)
RBC: 3.6 MIL/uL — AB (ref 3.87–5.11)
RDW: 16.1 % — ABNORMAL HIGH (ref 11.5–15.5)
WBC: 7.8 10*3/uL (ref 4.0–10.5)

## 2015-05-15 DIAGNOSIS — T84020D Dislocation of internal right hip prosthesis, subsequent encounter: Secondary | ICD-10-CM | POA: Diagnosis not present

## 2015-05-15 DIAGNOSIS — Z434 Encounter for attention to other artificial openings of digestive tract: Secondary | ICD-10-CM | POA: Diagnosis not present

## 2015-05-15 DIAGNOSIS — Z452 Encounter for adjustment and management of vascular access device: Secondary | ICD-10-CM | POA: Diagnosis not present

## 2015-05-15 DIAGNOSIS — J853 Abscess of mediastinum: Secondary | ICD-10-CM | POA: Diagnosis not present

## 2015-05-15 DIAGNOSIS — T849XXD Unspecified complication of internal orthopedic prosthetic device, implant and graft, subsequent encounter: Secondary | ICD-10-CM | POA: Diagnosis not present

## 2015-05-15 DIAGNOSIS — M4622 Osteomyelitis of vertebra, cervical region: Secondary | ICD-10-CM | POA: Diagnosis not present

## 2015-05-16 DIAGNOSIS — T849XXD Unspecified complication of internal orthopedic prosthetic device, implant and graft, subsequent encounter: Secondary | ICD-10-CM | POA: Diagnosis not present

## 2015-05-16 DIAGNOSIS — Z434 Encounter for attention to other artificial openings of digestive tract: Secondary | ICD-10-CM | POA: Diagnosis not present

## 2015-05-16 DIAGNOSIS — T84020D Dislocation of internal right hip prosthesis, subsequent encounter: Secondary | ICD-10-CM | POA: Diagnosis not present

## 2015-05-16 DIAGNOSIS — Z452 Encounter for adjustment and management of vascular access device: Secondary | ICD-10-CM | POA: Diagnosis not present

## 2015-05-16 DIAGNOSIS — J853 Abscess of mediastinum: Secondary | ICD-10-CM | POA: Diagnosis not present

## 2015-05-16 DIAGNOSIS — M4622 Osteomyelitis of vertebra, cervical region: Secondary | ICD-10-CM | POA: Diagnosis not present

## 2015-05-17 DIAGNOSIS — T84020D Dislocation of internal right hip prosthesis, subsequent encounter: Secondary | ICD-10-CM | POA: Diagnosis not present

## 2015-05-17 DIAGNOSIS — J853 Abscess of mediastinum: Secondary | ICD-10-CM | POA: Diagnosis not present

## 2015-05-17 DIAGNOSIS — I739 Peripheral vascular disease, unspecified: Secondary | ICD-10-CM | POA: Diagnosis not present

## 2015-05-17 DIAGNOSIS — Z452 Encounter for adjustment and management of vascular access device: Secondary | ICD-10-CM | POA: Diagnosis not present

## 2015-05-17 DIAGNOSIS — M4622 Osteomyelitis of vertebra, cervical region: Secondary | ICD-10-CM | POA: Diagnosis not present

## 2015-05-17 DIAGNOSIS — T849XXD Unspecified complication of internal orthopedic prosthetic device, implant and graft, subsequent encounter: Secondary | ICD-10-CM | POA: Diagnosis not present

## 2015-05-17 DIAGNOSIS — L603 Nail dystrophy: Secondary | ICD-10-CM | POA: Diagnosis not present

## 2015-05-17 DIAGNOSIS — L84 Corns and callosities: Secondary | ICD-10-CM | POA: Diagnosis not present

## 2015-05-17 DIAGNOSIS — Z434 Encounter for attention to other artificial openings of digestive tract: Secondary | ICD-10-CM | POA: Diagnosis not present

## 2015-05-17 DIAGNOSIS — E1051 Type 1 diabetes mellitus with diabetic peripheral angiopathy without gangrene: Secondary | ICD-10-CM | POA: Diagnosis not present

## 2015-05-18 DIAGNOSIS — Z4789 Encounter for other orthopedic aftercare: Secondary | ICD-10-CM | POA: Diagnosis not present

## 2015-05-18 DIAGNOSIS — T84020D Dislocation of internal right hip prosthesis, subsequent encounter: Secondary | ICD-10-CM | POA: Diagnosis not present

## 2015-05-18 DIAGNOSIS — J853 Abscess of mediastinum: Secondary | ICD-10-CM | POA: Diagnosis not present

## 2015-05-18 DIAGNOSIS — Z452 Encounter for adjustment and management of vascular access device: Secondary | ICD-10-CM | POA: Diagnosis not present

## 2015-05-18 DIAGNOSIS — Z434 Encounter for attention to other artificial openings of digestive tract: Secondary | ICD-10-CM | POA: Diagnosis not present

## 2015-05-18 DIAGNOSIS — M4622 Osteomyelitis of vertebra, cervical region: Secondary | ICD-10-CM | POA: Diagnosis not present

## 2015-05-18 DIAGNOSIS — T849XXD Unspecified complication of internal orthopedic prosthetic device, implant and graft, subsequent encounter: Secondary | ICD-10-CM | POA: Diagnosis not present

## 2015-05-21 DIAGNOSIS — J853 Abscess of mediastinum: Secondary | ICD-10-CM | POA: Diagnosis not present

## 2015-05-21 DIAGNOSIS — T84020D Dislocation of internal right hip prosthesis, subsequent encounter: Secondary | ICD-10-CM | POA: Diagnosis not present

## 2015-05-21 DIAGNOSIS — Z434 Encounter for attention to other artificial openings of digestive tract: Secondary | ICD-10-CM | POA: Diagnosis not present

## 2015-05-21 DIAGNOSIS — T849XXD Unspecified complication of internal orthopedic prosthetic device, implant and graft, subsequent encounter: Secondary | ICD-10-CM | POA: Diagnosis not present

## 2015-05-21 DIAGNOSIS — Z452 Encounter for adjustment and management of vascular access device: Secondary | ICD-10-CM | POA: Diagnosis not present

## 2015-05-21 DIAGNOSIS — M4622 Osteomyelitis of vertebra, cervical region: Secondary | ICD-10-CM | POA: Diagnosis not present

## 2015-05-23 DIAGNOSIS — Z434 Encounter for attention to other artificial openings of digestive tract: Secondary | ICD-10-CM | POA: Diagnosis not present

## 2015-05-23 DIAGNOSIS — T84020D Dislocation of internal right hip prosthesis, subsequent encounter: Secondary | ICD-10-CM | POA: Diagnosis not present

## 2015-05-23 DIAGNOSIS — T849XXD Unspecified complication of internal orthopedic prosthetic device, implant and graft, subsequent encounter: Secondary | ICD-10-CM | POA: Diagnosis not present

## 2015-05-23 DIAGNOSIS — Z452 Encounter for adjustment and management of vascular access device: Secondary | ICD-10-CM | POA: Diagnosis not present

## 2015-05-23 DIAGNOSIS — J853 Abscess of mediastinum: Secondary | ICD-10-CM | POA: Diagnosis not present

## 2015-05-23 DIAGNOSIS — M4622 Osteomyelitis of vertebra, cervical region: Secondary | ICD-10-CM | POA: Diagnosis not present

## 2015-05-24 ENCOUNTER — Telehealth: Payer: Self-pay | Admitting: *Deleted

## 2015-05-24 DIAGNOSIS — E46 Unspecified protein-calorie malnutrition: Secondary | ICD-10-CM | POA: Diagnosis not present

## 2015-05-24 DIAGNOSIS — E119 Type 2 diabetes mellitus without complications: Secondary | ICD-10-CM | POA: Diagnosis not present

## 2015-05-24 DIAGNOSIS — E782 Mixed hyperlipidemia: Secondary | ICD-10-CM | POA: Diagnosis not present

## 2015-05-24 DIAGNOSIS — T84020D Dislocation of internal right hip prosthesis, subsequent encounter: Secondary | ICD-10-CM | POA: Diagnosis not present

## 2015-05-24 DIAGNOSIS — T849XXD Unspecified complication of internal orthopedic prosthetic device, implant and graft, subsequent encounter: Secondary | ICD-10-CM | POA: Diagnosis not present

## 2015-05-24 DIAGNOSIS — J449 Chronic obstructive pulmonary disease, unspecified: Secondary | ICD-10-CM | POA: Diagnosis not present

## 2015-05-24 DIAGNOSIS — R740 Nonspecific elevation of levels of transaminase and lactic acid dehydrogenase [LDH]: Secondary | ICD-10-CM | POA: Diagnosis not present

## 2015-05-24 DIAGNOSIS — M4622 Osteomyelitis of vertebra, cervical region: Secondary | ICD-10-CM | POA: Diagnosis not present

## 2015-05-24 DIAGNOSIS — M869 Osteomyelitis, unspecified: Secondary | ICD-10-CM | POA: Diagnosis not present

## 2015-05-24 DIAGNOSIS — K228 Other specified diseases of esophagus: Secondary | ICD-10-CM | POA: Diagnosis not present

## 2015-05-24 DIAGNOSIS — F17200 Nicotine dependence, unspecified, uncomplicated: Secondary | ICD-10-CM | POA: Diagnosis not present

## 2015-05-24 DIAGNOSIS — Z452 Encounter for adjustment and management of vascular access device: Secondary | ICD-10-CM | POA: Diagnosis not present

## 2015-05-24 DIAGNOSIS — Z434 Encounter for attention to other artificial openings of digestive tract: Secondary | ICD-10-CM | POA: Diagnosis not present

## 2015-05-24 DIAGNOSIS — J853 Abscess of mediastinum: Secondary | ICD-10-CM | POA: Diagnosis not present

## 2015-05-24 DIAGNOSIS — Z931 Gastrostomy status: Secondary | ICD-10-CM | POA: Diagnosis not present

## 2015-05-24 DIAGNOSIS — I2510 Atherosclerotic heart disease of native coronary artery without angina pectoris: Secondary | ICD-10-CM | POA: Diagnosis not present

## 2015-05-24 NOTE — Telephone Encounter (Signed)
Patient would like to know if [facility] nurses can relay her test results, as she was recently told that they were not allowed to do so.  This RN advised the patient that she should certainly ask for those results, and she could also ask for those results from either her [facility] contact or from the [facility] pharmacy.  Patient verbalized understanding. She also requested that her labs be forwarded to her PCP, Dr. Jacelyn Grip.  RN relayed that request to Jackelyn Poling at the [place]. Landis Gandy, RN

## 2015-05-25 DIAGNOSIS — Z471 Aftercare following joint replacement surgery: Secondary | ICD-10-CM | POA: Diagnosis not present

## 2015-05-25 DIAGNOSIS — M25551 Pain in right hip: Secondary | ICD-10-CM | POA: Diagnosis not present

## 2015-05-25 DIAGNOSIS — Z452 Encounter for adjustment and management of vascular access device: Secondary | ICD-10-CM | POA: Diagnosis not present

## 2015-05-25 DIAGNOSIS — M4622 Osteomyelitis of vertebra, cervical region: Secondary | ICD-10-CM | POA: Diagnosis not present

## 2015-05-25 DIAGNOSIS — T849XXD Unspecified complication of internal orthopedic prosthetic device, implant and graft, subsequent encounter: Secondary | ICD-10-CM | POA: Diagnosis not present

## 2015-05-25 DIAGNOSIS — Z434 Encounter for attention to other artificial openings of digestive tract: Secondary | ICD-10-CM | POA: Diagnosis not present

## 2015-05-25 DIAGNOSIS — T84020D Dislocation of internal right hip prosthesis, subsequent encounter: Secondary | ICD-10-CM | POA: Diagnosis not present

## 2015-05-25 DIAGNOSIS — Z96641 Presence of right artificial hip joint: Secondary | ICD-10-CM | POA: Diagnosis not present

## 2015-05-25 DIAGNOSIS — S73004D Unspecified dislocation of right hip, subsequent encounter: Secondary | ICD-10-CM | POA: Diagnosis not present

## 2015-05-25 DIAGNOSIS — J853 Abscess of mediastinum: Secondary | ICD-10-CM | POA: Diagnosis not present

## 2015-05-28 DIAGNOSIS — T84020D Dislocation of internal right hip prosthesis, subsequent encounter: Secondary | ICD-10-CM | POA: Diagnosis not present

## 2015-05-28 DIAGNOSIS — M4622 Osteomyelitis of vertebra, cervical region: Secondary | ICD-10-CM | POA: Diagnosis not present

## 2015-05-28 DIAGNOSIS — J853 Abscess of mediastinum: Secondary | ICD-10-CM | POA: Diagnosis not present

## 2015-05-28 DIAGNOSIS — Z452 Encounter for adjustment and management of vascular access device: Secondary | ICD-10-CM | POA: Diagnosis not present

## 2015-05-28 DIAGNOSIS — T849XXD Unspecified complication of internal orthopedic prosthetic device, implant and graft, subsequent encounter: Secondary | ICD-10-CM | POA: Diagnosis not present

## 2015-05-28 DIAGNOSIS — Z434 Encounter for attention to other artificial openings of digestive tract: Secondary | ICD-10-CM | POA: Diagnosis not present

## 2015-05-29 DIAGNOSIS — T84020D Dislocation of internal right hip prosthesis, subsequent encounter: Secondary | ICD-10-CM | POA: Diagnosis not present

## 2015-05-29 DIAGNOSIS — J853 Abscess of mediastinum: Secondary | ICD-10-CM | POA: Diagnosis not present

## 2015-05-29 DIAGNOSIS — Z434 Encounter for attention to other artificial openings of digestive tract: Secondary | ICD-10-CM | POA: Diagnosis not present

## 2015-05-29 DIAGNOSIS — M4622 Osteomyelitis of vertebra, cervical region: Secondary | ICD-10-CM | POA: Diagnosis not present

## 2015-05-29 DIAGNOSIS — T849XXD Unspecified complication of internal orthopedic prosthetic device, implant and graft, subsequent encounter: Secondary | ICD-10-CM | POA: Diagnosis not present

## 2015-05-29 DIAGNOSIS — Z452 Encounter for adjustment and management of vascular access device: Secondary | ICD-10-CM | POA: Diagnosis not present

## 2015-06-01 DIAGNOSIS — Z434 Encounter for attention to other artificial openings of digestive tract: Secondary | ICD-10-CM | POA: Diagnosis not present

## 2015-06-01 DIAGNOSIS — T849XXD Unspecified complication of internal orthopedic prosthetic device, implant and graft, subsequent encounter: Secondary | ICD-10-CM | POA: Diagnosis not present

## 2015-06-01 DIAGNOSIS — J853 Abscess of mediastinum: Secondary | ICD-10-CM | POA: Diagnosis not present

## 2015-06-01 DIAGNOSIS — M4622 Osteomyelitis of vertebra, cervical region: Secondary | ICD-10-CM | POA: Diagnosis not present

## 2015-06-01 DIAGNOSIS — T148 Other injury of unspecified body region: Secondary | ICD-10-CM | POA: Diagnosis not present

## 2015-06-01 DIAGNOSIS — Z452 Encounter for adjustment and management of vascular access device: Secondary | ICD-10-CM | POA: Diagnosis not present

## 2015-06-01 DIAGNOSIS — S0181XD Laceration without foreign body of other part of head, subsequent encounter: Secondary | ICD-10-CM | POA: Diagnosis not present

## 2015-06-01 DIAGNOSIS — T84020D Dislocation of internal right hip prosthesis, subsequent encounter: Secondary | ICD-10-CM | POA: Diagnosis not present

## 2015-06-04 DIAGNOSIS — J853 Abscess of mediastinum: Secondary | ICD-10-CM | POA: Diagnosis not present

## 2015-06-04 DIAGNOSIS — M4622 Osteomyelitis of vertebra, cervical region: Secondary | ICD-10-CM | POA: Diagnosis not present

## 2015-06-04 DIAGNOSIS — T84020D Dislocation of internal right hip prosthesis, subsequent encounter: Secondary | ICD-10-CM | POA: Diagnosis not present

## 2015-06-04 DIAGNOSIS — Z452 Encounter for adjustment and management of vascular access device: Secondary | ICD-10-CM | POA: Diagnosis not present

## 2015-06-04 DIAGNOSIS — Z434 Encounter for attention to other artificial openings of digestive tract: Secondary | ICD-10-CM | POA: Diagnosis not present

## 2015-06-04 DIAGNOSIS — T849XXD Unspecified complication of internal orthopedic prosthetic device, implant and graft, subsequent encounter: Secondary | ICD-10-CM | POA: Diagnosis not present

## 2015-06-05 DIAGNOSIS — Z452 Encounter for adjustment and management of vascular access device: Secondary | ICD-10-CM | POA: Diagnosis not present

## 2015-06-05 DIAGNOSIS — Z434 Encounter for attention to other artificial openings of digestive tract: Secondary | ICD-10-CM | POA: Diagnosis not present

## 2015-06-05 DIAGNOSIS — T84020D Dislocation of internal right hip prosthesis, subsequent encounter: Secondary | ICD-10-CM | POA: Diagnosis not present

## 2015-06-05 DIAGNOSIS — J853 Abscess of mediastinum: Secondary | ICD-10-CM | POA: Diagnosis not present

## 2015-06-05 DIAGNOSIS — T849XXD Unspecified complication of internal orthopedic prosthetic device, implant and graft, subsequent encounter: Secondary | ICD-10-CM | POA: Diagnosis not present

## 2015-06-05 DIAGNOSIS — M4622 Osteomyelitis of vertebra, cervical region: Secondary | ICD-10-CM | POA: Diagnosis not present

## 2015-06-06 ENCOUNTER — Ambulatory Visit (INDEPENDENT_AMBULATORY_CARE_PROVIDER_SITE_OTHER): Payer: Medicare Other | Admitting: Infectious Disease

## 2015-06-06 VITALS — BP 114/72 | HR 78 | Temp 98.2°F | Ht 63.5 in | Wt 119.0 lb

## 2015-06-06 DIAGNOSIS — G062 Extradural and subdural abscess, unspecified: Secondary | ICD-10-CM

## 2015-06-06 DIAGNOSIS — M25551 Pain in right hip: Secondary | ICD-10-CM

## 2015-06-06 DIAGNOSIS — K223 Perforation of esophagus: Secondary | ICD-10-CM

## 2015-06-06 DIAGNOSIS — T84029D Dislocation of unspecified internal joint prosthesis, subsequent encounter: Secondary | ICD-10-CM

## 2015-06-06 DIAGNOSIS — L0211 Cutaneous abscess of neck: Secondary | ICD-10-CM

## 2015-06-06 DIAGNOSIS — Z23 Encounter for immunization: Secondary | ICD-10-CM

## 2015-06-06 DIAGNOSIS — J853 Abscess of mediastinum: Secondary | ICD-10-CM

## 2015-06-06 DIAGNOSIS — R579 Shock, unspecified: Secondary | ICD-10-CM | POA: Diagnosis not present

## 2015-06-06 DIAGNOSIS — M4622 Osteomyelitis of vertebra, cervical region: Secondary | ICD-10-CM | POA: Diagnosis not present

## 2015-06-06 DIAGNOSIS — M4643 Discitis, unspecified, cervicothoracic region: Secondary | ICD-10-CM

## 2015-06-06 DIAGNOSIS — I2510 Atherosclerotic heart disease of native coronary artery without angina pectoris: Secondary | ICD-10-CM | POA: Diagnosis not present

## 2015-06-06 DIAGNOSIS — Z96649 Presence of unspecified artificial hip joint: Secondary | ICD-10-CM

## 2015-06-06 NOTE — Progress Notes (Signed)
Chief complaint: spot on surgical wound where she has drainage Subjective:    Patient ID: [name], female    DOB: 02-02-1946, 69 y.o.   MRN: [id number]  HPI  69 y.o. 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 Initially surgery was not going to be performed and we were consulted and placed the patient on broad spectrum vancomycin and meropenem. However ultimately surgery WAS pursued and I stopped her abx 48 hours prior to   joint Surgery b Dr. Rolena Infante and Dr. Erik Obey with : Direct Laryngoscopy , Esophagoscopy 1. Removal of anterior cervical plate. 2. Exploration of spinal fusion. 3. Incision and drainage of cervical abscess with placement of drains.  Cultures were taken and grew mx organisms but no staph or strep and no predominant species. Dr. Megan Salon narrowed the pt to meropenem. Pt had feeding tube placed and is NPO and is on course to finish 8 weeks of postoperative abx on October 12th.  She denies much neck pain but never had much in first place.  She has complained of an area on her neck where she has had drainage and which PA from [facility] termed a cyst. She has not yet seen Dr. Erik Obey for evaluation of this area.  She still needs evaluation of her recurrent hip dislocation and I worry about infection there as well  Past Medical History  Diagnosis Date  . Myocardial infarction 2000  . Diabetes mellitus type 2, insulin dependent   . COPD (chronic obstructive pulmonary disease)   . Hyperlipidemia   . OA (osteoarthritis) of knee   . Hypertension   . Coronary artery disease   . Asthma   . Elevated LFTs   . ETOH abuse   . Tobacco abuse   . Osteoarthritis   . Breast cancer    Past Surgical History  Procedure Laterality Date  . Breast surgery  1991    right mastectomy  . Vulvectomy  1981    partial  . Neck fusion  2011   . Pelvic laparoscopy  2002    RSO  . Cesarean section  '78, '80, '81    x 3  . Hysteroscopy      D & C  . Cardiac catheterization  04/05/2009    EF 60%  . Lobectomy    . Tonsillectomy and adenoidectomy    . Mastectomy    . Coronary angioplasty  08/1998    x2 OF A BIFURCATION OM-1, OM-2 LESION  . Coronary angioplasty with stent placement  01/1999    MID FIRST OBTUSE MARGINAL VESSEL  . Coronary angioplasty with stent placement  07/1999    STENTING AT THE CRUX OF THE RIGHT CORONARY ARTERY WITH A 3.8MM X 18MM TETRA STENT  . Cardiovascular stress test  01/31/2005    EF 58%  . Total hip arthroplasty  08/2010    bilat  . Hip closed reduction Right 04/26/2015    Procedure: CLOSED REDUCTION HIP;  Surgeon: Melina Schools, MD;  Location: [facility];  Service: Orthopedics;  Laterality: Right;  . Incision and drainage abscess N/A 05/03/2015    Procedure: INCISION AND DRAINAGE CERVICAL  ABSCESS AND REMOVAL OF HARDWARE;  Surgeon: Melina Schools, MD;  Location: [place];  Service: Orthopedics;  Laterality: N/A;  . Hardware removal N/A 05/03/2015    Procedure: HARDWARE REMOVAL;  Surgeon: Melina Schools, MD;  Location: [place];  Service: Orthopedics;  Laterality:  Chief complaint: spot on surgical wound where she has drainage Subjective:    Patient ID: [name], female    DOB: 03-14-1946, 69 y.o.   MRN: [id number]  HPI  69 y.o. 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 Initially surgery was not going to be performed and we were consulted and placed the patient on broad spectrum vancomycin and meropenem. However ultimately surgery WAS pursued and I stopped her abx 48 hours prior to   joint Surgery b Dr. Rolena Infante and Dr. Erik Obey with : Direct Laryngoscopy , Esophagoscopy 1. Removal of anterior cervical plate. 2. Exploration of spinal fusion. 3. Incision and drainage of cervical abscess with placement of drains.  Cultures were taken and grew mx organisms but no staph or strep and no predominant species. Dr. Megan Salon narrowed the pt to meropenem. Pt had feeding tube placed and is NPO and is on course to finish 8 weeks of postoperative abx on October 12th.  She denies much neck pain but never had much in first place.  She has complained of an area on her neck where she has had drainage and which PA from [facility] termed a cyst. She has not yet seen Dr. Erik Obey for evaluation of this area.  She still needs evaluation of her recurrent hip dislocation and I worry about infection there as well  Past Medical History  Diagnosis Date  . Myocardial infarction 2000  . Diabetes mellitus type 2, insulin dependent   . COPD (chronic obstructive pulmonary disease)   . Hyperlipidemia   . OA (osteoarthritis) of knee   . Hypertension   . Coronary artery disease   . Asthma   . Elevated LFTs   . ETOH abuse   . Tobacco abuse   . Osteoarthritis   . Breast cancer    Past Surgical History  Procedure Laterality Date  . Breast surgery  1991    right mastectomy  . Vulvectomy  1981    partial  . Neck fusion  2011   . Pelvic laparoscopy  2002    RSO  . Cesarean section  '78, '80, '81    x 3  . Hysteroscopy      D & C  . Cardiac catheterization  04/05/2009    EF 60%  . Lobectomy    . Tonsillectomy and adenoidectomy    . Mastectomy    . Coronary angioplasty  08/1998    x2 OF A BIFURCATION OM-1, OM-2 LESION  . Coronary angioplasty with stent placement  01/1999    MID FIRST OBTUSE MARGINAL VESSEL  . Coronary angioplasty with stent placement  07/1999    STENTING AT THE CRUX OF THE RIGHT CORONARY ARTERY WITH A 3.8MM X 18MM TETRA STENT  . Cardiovascular stress test  01/31/2005    EF 58%  . Total hip arthroplasty  08/2010    bilat  . Hip closed reduction Right 04/26/2015    Procedure: CLOSED REDUCTION HIP;  Surgeon: Melina Schools, MD;  Location: [facility];  Service: Orthopedics;  Laterality: Right;  . Incision and drainage abscess N/A 05/03/2015    Procedure: INCISION AND DRAINAGE CERVICAL  ABSCESS AND REMOVAL OF HARDWARE;  Surgeon: Melina Schools, MD;  Location: [place];  Service: Orthopedics;  Laterality: N/A;  . Hardware removal N/A 05/03/2015    Procedure: HARDWARE REMOVAL;  Surgeon: Melina Schools, MD;  Location: [place];  Service: Orthopedics;  Laterality:  Chief complaint: spot on surgical wound where she has drainage Subjective:    Patient ID: [name], female    DOB: 03-14-1946, 69 y.o.   MRN: [id number]  HPI  69 y.o. 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 Initially surgery was not going to be performed and we were consulted and placed the patient on broad spectrum vancomycin and meropenem. However ultimately surgery WAS pursued and I stopped her abx 48 hours prior to   joint Surgery b Dr. Rolena Infante and Dr. Erik Obey with : Direct Laryngoscopy , Esophagoscopy 1. Removal of anterior cervical plate. 2. Exploration of spinal fusion. 3. Incision and drainage of cervical abscess with placement of drains.  Cultures were taken and grew mx organisms but no staph or strep and no predominant species. Dr. Megan Salon narrowed the pt to meropenem. Pt had feeding tube placed and is NPO and is on course to finish 8 weeks of postoperative abx on October 12th.  She denies much neck pain but never had much in first place.  She has complained of an area on her neck where she has had drainage and which PA from [facility] termed a cyst. She has not yet seen Dr. Erik Obey for evaluation of this area.  She still needs evaluation of her recurrent hip dislocation and I worry about infection there as well  Past Medical History  Diagnosis Date  . Myocardial infarction 2000  . Diabetes mellitus type 2, insulin dependent   . COPD (chronic obstructive pulmonary disease)   . Hyperlipidemia   . OA (osteoarthritis) of knee   . Hypertension   . Coronary artery disease   . Asthma   . Elevated LFTs   . ETOH abuse   . Tobacco abuse   . Osteoarthritis   . Breast cancer    Past Surgical History  Procedure Laterality Date  . Breast surgery  1991    right mastectomy  . Vulvectomy  1981    partial  . Neck fusion  2011   . Pelvic laparoscopy  2002    RSO  . Cesarean section  '78, '80, '81    x 3  . Hysteroscopy      D & C  . Cardiac catheterization  04/05/2009    EF 60%  . Lobectomy    . Tonsillectomy and adenoidectomy    . Mastectomy    . Coronary angioplasty  08/1998    x2 OF A BIFURCATION OM-1, OM-2 LESION  . Coronary angioplasty with stent placement  01/1999    MID FIRST OBTUSE MARGINAL VESSEL  . Coronary angioplasty with stent placement  07/1999    STENTING AT THE CRUX OF THE RIGHT CORONARY ARTERY WITH A 3.8MM X 18MM TETRA STENT  . Cardiovascular stress test  01/31/2005    EF 58%  . Total hip arthroplasty  08/2010    bilat  . Hip closed reduction Right 04/26/2015    Procedure: CLOSED REDUCTION HIP;  Surgeon: Melina Schools, MD;  Location: [facility];  Service: Orthopedics;  Laterality: Right;  . Incision and drainage abscess N/A 05/03/2015    Procedure: INCISION AND DRAINAGE CERVICAL  ABSCESS AND REMOVAL OF HARDWARE;  Surgeon: Melina Schools, MD;  Location: [place];  Service: Orthopedics;  Laterality: N/A;  . Hardware removal N/A 05/03/2015    Procedure: HARDWARE REMOVAL;  Surgeon: Melina Schools, MD;  Location: [place];  Service: Orthopedics;  Laterality:

## 2015-06-07 ENCOUNTER — Telehealth: Payer: Self-pay | Admitting: *Deleted

## 2015-06-07 NOTE — Telephone Encounter (Signed)
Patient's MRI scheduled for 10/13, follow up with Dr. Tommy Medal 10/19. RN contacted Dr. Noreene Filbert office, they will work her in 9/23 4:10 for evaluation of her incision drainage. Pt's husband notified, accepted appointment. Landis Gandy, RN

## 2015-06-07 NOTE — Telephone Encounter (Signed)
[name] we need to extend her IV antibiotics until she has had the repeat MRI and has seen me

## 2015-06-08 DIAGNOSIS — Z9889 Other specified postprocedural states: Secondary | ICD-10-CM | POA: Diagnosis not present

## 2015-06-08 DIAGNOSIS — Z434 Encounter for attention to other artificial openings of digestive tract: Secondary | ICD-10-CM | POA: Diagnosis not present

## 2015-06-08 DIAGNOSIS — M4622 Osteomyelitis of vertebra, cervical region: Secondary | ICD-10-CM | POA: Diagnosis not present

## 2015-06-08 DIAGNOSIS — T84020D Dislocation of internal right hip prosthesis, subsequent encounter: Secondary | ICD-10-CM | POA: Diagnosis not present

## 2015-06-08 DIAGNOSIS — Z0189 Encounter for other specified special examinations: Secondary | ICD-10-CM | POA: Diagnosis not present

## 2015-06-08 DIAGNOSIS — T849XXD Unspecified complication of internal orthopedic prosthetic device, implant and graft, subsequent encounter: Secondary | ICD-10-CM | POA: Diagnosis not present

## 2015-06-08 DIAGNOSIS — J853 Abscess of mediastinum: Secondary | ICD-10-CM | POA: Diagnosis not present

## 2015-06-08 DIAGNOSIS — Z452 Encounter for adjustment and management of vascular access device: Secondary | ICD-10-CM | POA: Diagnosis not present

## 2015-06-08 NOTE — Telephone Encounter (Signed)
Perfect

## 2015-06-08 NOTE — Telephone Encounter (Signed)
Verbal order to Vinnie Level at [place] to continue IV antibiotics, PICC care, labs through 10/19 (her appointment with you). Landis Gandy, RN

## 2015-06-11 DIAGNOSIS — Z434 Encounter for attention to other artificial openings of digestive tract: Secondary | ICD-10-CM | POA: Diagnosis not present

## 2015-06-11 DIAGNOSIS — T849XXD Unspecified complication of internal orthopedic prosthetic device, implant and graft, subsequent encounter: Secondary | ICD-10-CM | POA: Diagnosis not present

## 2015-06-11 DIAGNOSIS — Z452 Encounter for adjustment and management of vascular access device: Secondary | ICD-10-CM | POA: Diagnosis not present

## 2015-06-11 DIAGNOSIS — J853 Abscess of mediastinum: Secondary | ICD-10-CM | POA: Diagnosis not present

## 2015-06-11 DIAGNOSIS — T84020D Dislocation of internal right hip prosthesis, subsequent encounter: Secondary | ICD-10-CM | POA: Diagnosis not present

## 2015-06-11 DIAGNOSIS — M4622 Osteomyelitis of vertebra, cervical region: Secondary | ICD-10-CM | POA: Diagnosis not present

## 2015-06-12 DIAGNOSIS — Z434 Encounter for attention to other artificial openings of digestive tract: Secondary | ICD-10-CM | POA: Diagnosis not present

## 2015-06-12 DIAGNOSIS — Z452 Encounter for adjustment and management of vascular access device: Secondary | ICD-10-CM | POA: Diagnosis not present

## 2015-06-12 DIAGNOSIS — T84020D Dislocation of internal right hip prosthesis, subsequent encounter: Secondary | ICD-10-CM | POA: Diagnosis not present

## 2015-06-12 DIAGNOSIS — M4622 Osteomyelitis of vertebra, cervical region: Secondary | ICD-10-CM | POA: Diagnosis not present

## 2015-06-12 DIAGNOSIS — T849XXD Unspecified complication of internal orthopedic prosthetic device, implant and graft, subsequent encounter: Secondary | ICD-10-CM | POA: Diagnosis not present

## 2015-06-12 DIAGNOSIS — J853 Abscess of mediastinum: Secondary | ICD-10-CM | POA: Diagnosis not present

## 2015-06-18 ENCOUNTER — Ambulatory Visit: Payer: Self-pay | Admitting: Physician Assistant

## 2015-06-18 DIAGNOSIS — T84020D Dislocation of internal right hip prosthesis, subsequent encounter: Secondary | ICD-10-CM | POA: Diagnosis not present

## 2015-06-18 DIAGNOSIS — Z452 Encounter for adjustment and management of vascular access device: Secondary | ICD-10-CM | POA: Diagnosis not present

## 2015-06-18 DIAGNOSIS — T849XXD Unspecified complication of internal orthopedic prosthetic device, implant and graft, subsequent encounter: Secondary | ICD-10-CM | POA: Diagnosis not present

## 2015-06-18 DIAGNOSIS — Z434 Encounter for attention to other artificial openings of digestive tract: Secondary | ICD-10-CM | POA: Diagnosis not present

## 2015-06-18 DIAGNOSIS — M4622 Osteomyelitis of vertebra, cervical region: Secondary | ICD-10-CM | POA: Diagnosis not present

## 2015-06-18 DIAGNOSIS — J853 Abscess of mediastinum: Secondary | ICD-10-CM | POA: Diagnosis not present

## 2015-06-18 DIAGNOSIS — Z4789 Encounter for other orthopedic aftercare: Secondary | ICD-10-CM | POA: Diagnosis not present

## 2015-06-19 ENCOUNTER — Encounter (HOSPITAL_COMMUNITY): Payer: Self-pay | Admitting: *Deleted

## 2015-06-19 DIAGNOSIS — Z452 Encounter for adjustment and management of vascular access device: Secondary | ICD-10-CM | POA: Diagnosis not present

## 2015-06-19 DIAGNOSIS — T84020D Dislocation of internal right hip prosthesis, subsequent encounter: Secondary | ICD-10-CM | POA: Diagnosis not present

## 2015-06-19 DIAGNOSIS — T849XXD Unspecified complication of internal orthopedic prosthetic device, implant and graft, subsequent encounter: Secondary | ICD-10-CM | POA: Diagnosis not present

## 2015-06-19 DIAGNOSIS — Z434 Encounter for attention to other artificial openings of digestive tract: Secondary | ICD-10-CM | POA: Diagnosis not present

## 2015-06-19 DIAGNOSIS — J853 Abscess of mediastinum: Secondary | ICD-10-CM | POA: Diagnosis not present

## 2015-06-19 DIAGNOSIS — M4622 Osteomyelitis of vertebra, cervical region: Secondary | ICD-10-CM | POA: Diagnosis not present

## 2015-06-19 MED ORDER — VANCOMYCIN HCL IN DEXTROSE 1-5 GM/200ML-% IV SOLN
1000.0000 mg | INTRAVENOUS | Status: AC
  Administered 2015-06-20: 1000 mg via INTRAVENOUS
  Filled 2015-06-19: qty 200

## 2015-06-20 ENCOUNTER — Ambulatory Visit (HOSPITAL_COMMUNITY): Payer: Medicare Other

## 2015-06-20 ENCOUNTER — Ambulatory Visit (HOSPITAL_COMMUNITY): Payer: Medicare Other | Admitting: Anesthesiology

## 2015-06-20 ENCOUNTER — Observation Stay (HOSPITAL_COMMUNITY)
Admission: RE | Admit: 2015-06-20 | Discharge: 2015-06-21 | Disposition: A | Discharge status: Home / Self Care | Payer: Medicare Other | Source: Ambulatory Visit | Attending: Orthopedic Surgery | Admitting: Orthopedic Surgery

## 2015-06-20 ENCOUNTER — Encounter (HOSPITAL_COMMUNITY)
Admission: RE | Disposition: A | Discharge status: Home / Self Care | Payer: Self-pay | Source: Ambulatory Visit | Attending: Orthopedic Surgery

## 2015-06-20 ENCOUNTER — Other Ambulatory Visit: Payer: Self-pay | Admitting: Otolaryngology

## 2015-06-20 ENCOUNTER — Encounter (HOSPITAL_COMMUNITY): Payer: Self-pay | Admitting: *Deleted

## 2015-06-20 DIAGNOSIS — I2510 Atherosclerotic heart disease of native coronary artery without angina pectoris: Secondary | ICD-10-CM | POA: Diagnosis not present

## 2015-06-20 DIAGNOSIS — Z01818 Encounter for other preprocedural examination: Secondary | ICD-10-CM

## 2015-06-20 DIAGNOSIS — Z7982 Long term (current) use of aspirin: Secondary | ICD-10-CM | POA: Diagnosis not present

## 2015-06-20 DIAGNOSIS — I252 Old myocardial infarction: Secondary | ICD-10-CM | POA: Insufficient documentation

## 2015-06-20 DIAGNOSIS — I10 Essential (primary) hypertension: Secondary | ICD-10-CM | POA: Insufficient documentation

## 2015-06-20 DIAGNOSIS — Z96641 Presence of right artificial hip joint: Secondary | ICD-10-CM | POA: Diagnosis not present

## 2015-06-20 DIAGNOSIS — F17200 Nicotine dependence, unspecified, uncomplicated: Secondary | ICD-10-CM | POA: Diagnosis not present

## 2015-06-20 DIAGNOSIS — Z87891 Personal history of nicotine dependence: Secondary | ICD-10-CM | POA: Diagnosis not present

## 2015-06-20 DIAGNOSIS — K223 Perforation of esophagus: Secondary | ICD-10-CM

## 2015-06-20 DIAGNOSIS — M869 Osteomyelitis, unspecified: Secondary | ICD-10-CM | POA: Diagnosis not present

## 2015-06-20 DIAGNOSIS — M25551 Pain in right hip: Secondary | ICD-10-CM | POA: Diagnosis not present

## 2015-06-20 DIAGNOSIS — T814XXA Infection following a procedure, initial encounter: Principal | ICD-10-CM | POA: Insufficient documentation

## 2015-06-20 DIAGNOSIS — K2289 Other specified disease of esophagus: Secondary | ICD-10-CM

## 2015-06-20 DIAGNOSIS — Z981 Arthrodesis status: Secondary | ICD-10-CM | POA: Diagnosis not present

## 2015-06-20 DIAGNOSIS — M4642 Discitis, unspecified, cervical region: Secondary | ICD-10-CM | POA: Diagnosis not present

## 2015-06-20 DIAGNOSIS — S1120XA Unspecified open wound of pharynx and cervical esophagus, initial encounter: Secondary | ICD-10-CM | POA: Diagnosis not present

## 2015-06-20 DIAGNOSIS — Z955 Presence of coronary angioplasty implant and graft: Secondary | ICD-10-CM | POA: Diagnosis not present

## 2015-06-20 DIAGNOSIS — T798XXA Other early complications of trauma, initial encounter: Secondary | ICD-10-CM | POA: Diagnosis present

## 2015-06-20 DIAGNOSIS — K228 Other specified diseases of esophagus: Secondary | ICD-10-CM

## 2015-06-20 DIAGNOSIS — J449 Chronic obstructive pulmonary disease, unspecified: Secondary | ICD-10-CM | POA: Diagnosis not present

## 2015-06-20 DIAGNOSIS — J45909 Unspecified asthma, uncomplicated: Secondary | ICD-10-CM | POA: Diagnosis not present

## 2015-06-20 DIAGNOSIS — F17210 Nicotine dependence, cigarettes, uncomplicated: Secondary | ICD-10-CM | POA: Diagnosis not present

## 2015-06-20 DIAGNOSIS — Y838 Other surgical procedures as the cause of abnormal reaction of the patient, or of later complication, without mention of misadventure at the time of the procedure: Secondary | ICD-10-CM | POA: Diagnosis not present

## 2015-06-20 DIAGNOSIS — E119 Type 2 diabetes mellitus without complications: Secondary | ICD-10-CM | POA: Diagnosis not present

## 2015-06-20 DIAGNOSIS — Z79891 Long term (current) use of opiate analgesic: Secondary | ICD-10-CM | POA: Insufficient documentation

## 2015-06-20 DIAGNOSIS — Z79899 Other long term (current) drug therapy: Secondary | ICD-10-CM | POA: Diagnosis not present

## 2015-06-20 DIAGNOSIS — L0211 Cutaneous abscess of neck: Secondary | ICD-10-CM | POA: Diagnosis not present

## 2015-06-20 DIAGNOSIS — M1990 Unspecified osteoarthritis, unspecified site: Secondary | ICD-10-CM | POA: Diagnosis not present

## 2015-06-20 HISTORY — PX: APPLICATION OF WOUND VAC: SHX5189

## 2015-06-20 LAB — CBC
HEMATOCRIT: 37.4 % (ref 36.0–46.0)
Hemoglobin: 12.8 g/dL (ref 12.0–15.0)
MCH: 28.4 pg (ref 26.0–34.0)
MCHC: 34.2 g/dL (ref 30.0–36.0)
MCV: 82.9 fL (ref 78.0–100.0)
Platelets: 309 10*3/uL (ref 150–400)
RBC: 4.51 MIL/uL (ref 3.87–5.11)
RDW: 16 % — AB (ref 11.5–15.5)
WBC: 10.7 10*3/uL — ABNORMAL HIGH (ref 4.0–10.5)

## 2015-06-20 LAB — BASIC METABOLIC PANEL
Anion gap: 11 (ref 5–15)
BUN: 18 mg/dL (ref 6–20)
CO2: 25 mmol/L (ref 22–32)
Calcium: 9.4 mg/dL (ref 8.9–10.3)
Chloride: 97 mmol/L — ABNORMAL LOW (ref 101–111)
Creatinine, Ser: 0.56 mg/dL (ref 0.44–1.00)
GFR calc Af Amer: >60 mL/min (ref >60)
GLUCOSE: 151 mg/dL — AB (ref 65–99)
POTASSIUM: 4.2 mmol/L (ref 3.5–5.1)
Sodium: 133 mmol/L — ABNORMAL LOW (ref 135–145)

## 2015-06-20 LAB — GLUCOSE, CAPILLARY: Glucose-Capillary: 156 mg/dL — ABNORMAL HIGH (ref 65–99)

## 2015-06-20 SURGERY — CERVICAL WOUND DEBRIDEMENT
Anesthesia: General | Site: Neck

## 2015-06-20 MED ORDER — MORPHINE SULFATE (PF) 2 MG/ML IV SOLN: 1.0000 mg | INTRAVENOUS | Status: DC | PRN

## 2015-06-20 MED ORDER — METHOCARBAMOL 500 MG PO TABS: 500.0000 mg | ORAL_TABLET | Freq: Four times a day (QID) | ORAL | Status: DC | PRN

## 2015-06-20 MED ORDER — ACETAMINOPHEN 325 MG PO TABS: 650.0000 mg | ORAL_TABLET | ORAL | Status: DC | PRN

## 2015-06-20 MED ORDER — HYDROMORPHONE HCL 1 MG/ML IJ SOLN
INTRAMUSCULAR | Status: AC
  Filled 2015-06-20: qty 1

## 2015-06-20 MED ORDER — EPHEDRINE SULFATE 50 MG/ML IJ SOLN
INTRAMUSCULAR | Status: DC | PRN
  Administered 2015-06-20: 10 mg via INTRAVENOUS
  Administered 2015-06-20: 5 mg via INTRAVENOUS

## 2015-06-20 MED ORDER — SUCCINYLCHOLINE CHLORIDE 20 MG/ML IJ SOLN
INTRAMUSCULAR | Status: DC | PRN
  Administered 2015-06-20: 100 mg via INTRAVENOUS

## 2015-06-20 MED ORDER — PROMETHAZINE HCL 25 MG/ML IJ SOLN
INTRAMUSCULAR | Status: AC
  Filled 2015-06-20: qty 1

## 2015-06-20 MED ORDER — FUROSEMIDE 40 MG PO TABS
40.0000 mg | ORAL_TABLET | Freq: Every day | ORAL | Status: DC
  Filled 2015-06-20: qty 1

## 2015-06-20 MED ORDER — GABAPENTIN 600 MG PO TABS
600.0000 mg | ORAL_TABLET | Freq: Four times a day (QID) | ORAL | Status: DC
  Administered 2015-06-20 – 2015-06-21 (×2): 600 mg via ORAL
  Filled 2015-06-20 (×5): qty 1

## 2015-06-20 MED ORDER — SODIUM CHLORIDE 0.9 % IJ SOLN: 3.0000 mL | INTRAMUSCULAR | Status: DC | PRN

## 2015-06-20 MED ORDER — PROMETHAZINE HCL 25 MG/ML IJ SOLN
6.2500 mg | INTRAMUSCULAR | Status: DC | PRN
  Administered 2015-06-20: 6.25 mg via INTRAVENOUS

## 2015-06-20 MED ORDER — JEVITY 1.2 CAL PO LIQD
237.0000 mL | ORAL | Status: DC
  Administered 2015-06-21: 237 mL via ORAL
  Filled 2015-06-20: qty 237

## 2015-06-20 MED ORDER — 0.9 % SODIUM CHLORIDE (POUR BTL) OPTIME
TOPICAL | Status: DC | PRN
  Administered 2015-06-20: 1000 mL

## 2015-06-20 MED ORDER — ONDANSETRON HCL 4 MG/2ML IJ SOLN
INTRAMUSCULAR | Status: AC
  Filled 2015-06-20: qty 2

## 2015-06-20 MED ORDER — ONDANSETRON HCL 4 MG/2ML IJ SOLN: 4.0000 mg | INTRAMUSCULAR | Status: DC | PRN

## 2015-06-20 MED ORDER — ALBUTEROL SULFATE (2.5 MG/3ML) 0.083% IN NEBU: 3.0000 mL | INHALATION_SOLUTION | Freq: Four times a day (QID) | RESPIRATORY_TRACT | Status: DC | PRN

## 2015-06-20 MED ORDER — FENTANYL CITRATE (PF) 250 MCG/5ML IJ SOLN
INTRAMUSCULAR | Status: AC
  Filled 2015-06-20: qty 5

## 2015-06-20 MED ORDER — LINAGLIPTIN 5 MG PO TABS
5.0000 mg | ORAL_TABLET | Freq: Every day | ORAL | Status: DC
  Administered 2015-06-21: 5 mg via ORAL
  Filled 2015-06-20: qty 1

## 2015-06-20 MED ORDER — METHOCARBAMOL 1000 MG/10ML IJ SOLN
500.0000 mg | Freq: Four times a day (QID) | INTRAVENOUS | Status: DC | PRN
  Filled 2015-06-20: qty 5

## 2015-06-20 MED ORDER — VANCOMYCIN HCL 500 MG IV SOLR
500.0000 mg | Freq: Two times a day (BID) | INTRAVENOUS | Status: DC
  Administered 2015-06-21: 500 mg via INTRAVENOUS
  Filled 2015-06-20 (×3): qty 500

## 2015-06-20 MED ORDER — FENTANYL 25 MCG/HR TD PT72
25.0000 ug | MEDICATED_PATCH | TRANSDERMAL | Status: DC
  Filled 2015-06-20: qty 1

## 2015-06-20 MED ORDER — ACETAMINOPHEN 650 MG RE SUPP: 650.0000 mg | RECTAL | Status: DC | PRN

## 2015-06-20 MED ORDER — OXYCODONE HCL 5 MG PO TABS
15.0000 mg | ORAL_TABLET | Freq: Four times a day (QID) | ORAL | Status: DC | PRN
  Administered 2015-06-20 – 2015-06-21 (×3): 15 mg via ORAL
  Filled 2015-06-20 (×3): qty 3

## 2015-06-20 MED ORDER — ONDANSETRON HCL 4 MG/2ML IJ SOLN
INTRAMUSCULAR | Status: DC | PRN
  Administered 2015-06-20: 4 mg via INTRAVENOUS

## 2015-06-20 MED ORDER — FENTANYL CITRATE (PF) 250 MCG/5ML IJ SOLN
INTRAMUSCULAR | Status: DC | PRN
  Administered 2015-06-20 (×2): 25 ug via INTRAVENOUS
  Administered 2015-06-20 (×2): 50 ug via INTRAVENOUS

## 2015-06-20 MED ORDER — HYDROMORPHONE HCL 1 MG/ML IJ SOLN
0.2500 mg | INTRAMUSCULAR | Status: DC | PRN
  Administered 2015-06-20: 1 mg via INTRAVENOUS

## 2015-06-20 MED ORDER — ROCURONIUM BROMIDE 50 MG/5ML IV SOLN
INTRAVENOUS | Status: AC
  Filled 2015-06-20: qty 1

## 2015-06-20 MED ORDER — PHENOL 1.4 % MT LIQD
1.0000 | OROMUCOSAL | Status: DC | PRN
  Filled 2015-06-20: qty 177

## 2015-06-20 MED ORDER — PROPOFOL 10 MG/ML IV BOLUS
INTRAVENOUS | Status: AC
  Filled 2015-06-20: qty 20

## 2015-06-20 MED ORDER — ASPIRIN EC 81 MG PO TBEC
81.0000 mg | DELAYED_RELEASE_TABLET | Freq: Every day | ORAL | Status: DC
  Administered 2015-06-20 – 2015-06-21 (×2): 81 mg via ORAL
  Filled 2015-06-20 (×2): qty 1

## 2015-06-20 MED ORDER — ALPRAZOLAM 0.25 MG PO TABS: 0.2500 mg | ORAL_TABLET | Freq: Every day | ORAL | Status: DC | PRN

## 2015-06-20 MED ORDER — GLYCOPYRROLATE 0.2 MG/ML IJ SOLN
INTRAMUSCULAR | Status: AC
  Filled 2015-06-20: qty 1

## 2015-06-20 MED ORDER — ARTIFICIAL TEARS OP OINT
TOPICAL_OINTMENT | OPHTHALMIC | Status: AC
  Filled 2015-06-20: qty 3.5

## 2015-06-20 MED ORDER — MENTHOL 3 MG MT LOZG
1.0000 | LOZENGE | OROMUCOSAL | Status: DC | PRN
  Filled 2015-06-20: qty 9

## 2015-06-20 MED ORDER — LIDOCAINE HCL (CARDIAC) 20 MG/ML IV SOLN
INTRAVENOUS | Status: DC | PRN
  Administered 2015-06-20: 80 mg via INTRAVENOUS

## 2015-06-20 MED ORDER — DOCUSATE SODIUM 100 MG PO CAPS
100.0000 mg | ORAL_CAPSULE | Freq: Two times a day (BID) | ORAL | Status: DC
  Filled 2015-06-20 (×2): qty 1

## 2015-06-20 MED ORDER — LOSARTAN POTASSIUM 25 MG PO TABS
25.0000 mg | ORAL_TABLET | Freq: Every day | ORAL | Status: DC
  Administered 2015-06-21: 25 mg via ORAL
  Filled 2015-06-20 (×2): qty 1

## 2015-06-20 MED ORDER — LACTATED RINGERS IV SOLN
INTRAVENOUS | Status: DC
  Administered 2015-06-20 (×2): via INTRAVENOUS

## 2015-06-20 MED ORDER — FREE WATER
60.0000 mL | Freq: Three times a day (TID) | Status: DC
  Administered 2015-06-20 – 2015-06-21 (×2): 60 mL

## 2015-06-20 MED ORDER — LIDOCAINE HCL (CARDIAC) 20 MG/ML IV SOLN
INTRAVENOUS | Status: AC
  Filled 2015-06-20: qty 5

## 2015-06-20 MED ORDER — PROPOFOL 10 MG/ML IV BOLUS
INTRAVENOUS | Status: DC | PRN
  Administered 2015-06-20: 150 mg via INTRAVENOUS

## 2015-06-20 MED ORDER — MIDAZOLAM HCL 2 MG/2ML IJ SOLN
INTRAMUSCULAR | Status: AC
  Filled 2015-06-20: qty 4

## 2015-06-20 MED ORDER — SODIUM CHLORIDE 0.9 % IJ SOLN: 3.0000 mL | Freq: Two times a day (BID) | INTRAMUSCULAR | Status: DC

## 2015-06-20 MED ORDER — PHENYLEPHRINE HCL 10 MG/ML IJ SOLN
INTRAMUSCULAR | Status: DC | PRN
  Administered 2015-06-20: 80 ug via INTRAVENOUS
  Administered 2015-06-20: 40 ug via INTRAVENOUS
  Administered 2015-06-20: 80 ug via INTRAVENOUS

## 2015-06-20 MED ORDER — SODIUM CHLORIDE 0.9 % IV SOLN
250.0000 mL | INTRAVENOUS | Status: DC
  Administered 2015-06-21: 250 mL via INTRAVENOUS

## 2015-06-20 MED ORDER — ALBUTEROL SULFATE (2.5 MG/3ML) 0.083% IN NEBU: 2.5000 mg | INHALATION_SOLUTION | Freq: Four times a day (QID) | RESPIRATORY_TRACT | Status: DC | PRN

## 2015-06-20 SURGICAL SUPPLY — 55 items
BLADE SURG ROTATE 9660 (MISCELLANEOUS) IMPLANT
CANISTER SUCTION 2500CC (MISCELLANEOUS) ×3 IMPLANT
CLOSURE STERI-STRIP 1/2X4 (GAUZE/BANDAGES/DRESSINGS)
CLSR STERI-STRIP ANTIMIC 1/2X4 (GAUZE/BANDAGES/DRESSINGS) ×1 IMPLANT
COVER SURGICAL LIGHT HANDLE (MISCELLANEOUS) ×6 IMPLANT
CRADLE DONUT ADULT HEAD (MISCELLANEOUS) ×3 IMPLANT
DRAPE SURG 17X23 STRL (DRAPES) ×3 IMPLANT
DRAPE U-SHAPE 47X51 STRL (DRAPES) ×3 IMPLANT
DRSG MEPILEX BORDER 4X4 (GAUZE/BANDAGES/DRESSINGS) ×1 IMPLANT
DURAPREP 26ML APPLICATOR (WOUND CARE) ×3 IMPLANT
ELECT COATED BLADE 2.86 ST (ELECTRODE) ×3 IMPLANT
ELECT PENCIL ROCKER SW 15FT (MISCELLANEOUS) ×3 IMPLANT
ELECT REM PT RETURN 9FT ADLT (ELECTROSURGICAL) ×3
ELECTRODE REM PT RTRN 9FT ADLT (ELECTROSURGICAL) ×1 IMPLANT
GLOVE BIO SURGEON STRL SZ 6.5 (GLOVE) ×1 IMPLANT
GLOVE BIO SURGEONS STRL SZ 6.5 (GLOVE) ×1
GLOVE BIOGEL PI IND STRL 6.5 (GLOVE) IMPLANT
GLOVE BIOGEL PI IND STRL 8 (GLOVE) ×1 IMPLANT
GLOVE BIOGEL PI IND STRL 8.5 (GLOVE) ×1 IMPLANT
GLOVE BIOGEL PI INDICATOR 6.5 (GLOVE) ×2
GLOVE BIOGEL PI INDICATOR 8 (GLOVE)
GLOVE BIOGEL PI INDICATOR 8.5 (GLOVE)
GLOVE ORTHO TXT STRL SZ7.5 (GLOVE) ×1 IMPLANT
GLOVE SS BIOGEL STRL SZ 8.5 (GLOVE) ×1 IMPLANT
GLOVE SUPERSENSE BIOGEL SZ 8.5 (GLOVE) ×2
GOWN STRL REUS W/ TWL XL LVL3 (GOWN DISPOSABLE) ×1 IMPLANT
GOWN STRL REUS W/TWL 2XL LVL3 (GOWN DISPOSABLE) ×4 IMPLANT
GOWN STRL REUS W/TWL XL LVL3 (GOWN DISPOSABLE) ×3
KIT BASIN OR (CUSTOM PROCEDURE TRAY) ×3 IMPLANT
KIT PREVENA INCISION MGT 13 (CANNISTER) ×3 IMPLANT
KIT ROOM TURNOVER OR (KITS) ×3 IMPLANT
LIQUID BAND (GAUZE/BANDAGES/DRESSINGS) ×2 IMPLANT
NDL SPNL 18GX3.5 QUINCKE PK (NEEDLE) ×1 IMPLANT
NEEDLE SPNL 18GX3.5 QUINCKE PK (NEEDLE) IMPLANT
NS IRRIG 1000ML POUR BTL (IV SOLUTION) ×3 IMPLANT
PACK ORTHO CERVICAL (CUSTOM PROCEDURE TRAY) ×1 IMPLANT
PACK UNIVERSAL I (CUSTOM PROCEDURE TRAY) ×3 IMPLANT
PAD ARMBOARD 7.5X6 YLW CONV (MISCELLANEOUS) ×6 IMPLANT
PATTIES SURGICAL .25X.25 (GAUZE/BANDAGES/DRESSINGS) IMPLANT
PREVENA INCISION MGT 90 150 (MISCELLANEOUS) ×2 IMPLANT
RESTRAINT LIMB HOLDER UNIV (RESTRAINTS) ×1 IMPLANT
SPONGE SURGIFOAM ABS GEL 100 (HEMOSTASIS) ×1 IMPLANT
SUT BONE WAX W31G (SUTURE) ×3 IMPLANT
SUT MON AB 3-0 SH 27 (SUTURE) ×3
SUT MON AB 3-0 SH27 (SUTURE) ×1 IMPLANT
SUT PDS AB 3-0 SH 27 (SUTURE) ×2 IMPLANT
SUT SILK 2 0 (SUTURE)
SUT SILK 2-0 18XBRD TIE 12 (SUTURE) IMPLANT
SUT VIC AB 2-0 CT1 18 (SUTURE) ×3 IMPLANT
SYR BULB IRRIGATION 50ML (SYRINGE) ×3 IMPLANT
SYR CONTROL 10ML LL (SYRINGE) ×3 IMPLANT
TAPE CLOTH 4X10 WHT NS (GAUZE/BANDAGES/DRESSINGS) ×3 IMPLANT
TOWEL OR 17X24 6PK STRL BLUE (TOWEL DISPOSABLE) ×3 IMPLANT
TOWEL OR 17X26 10 PK STRL BLUE (TOWEL DISPOSABLE) ×3 IMPLANT
WATER STERILE IRR 1000ML POUR (IV SOLUTION) ×3 IMPLANT

## 2015-06-20 NOTE — Anesthesia Procedure Notes (Signed)
Procedure Name: Intubation Date/Time: 06/20/2015 3:09 PM Performed by: Shirlyn Goltz Pre-anesthesia Checklist: Patient identified, Emergency Drugs available, Suction available, Patient being monitored and Timeout performed Patient Re-evaluated:Patient Re-evaluated prior to inductionOxygen Delivery Method: Circle system utilized Preoxygenation: Pre-oxygenation with 100% oxygen Intubation Type: IV induction Ventilation: Mask ventilation without difficulty Laryngoscope Size: Mac and 3 Grade View: Grade I Tube type: Oral Tube size: 7.0 mm Number of attempts: 1 Airway Equipment and Method: Stylet Placement Confirmation: ETT inserted through vocal cords under direct vision,  positive ETCO2 and breath sounds checked- equal and bilateral Secured at: 21 cm Tube secured with: Tape Dental Injury: Teeth and Oropharynx as per pre-operative assessment

## 2015-06-20 NOTE — Progress Notes (Signed)
Orthopedic Tech Progress Note Patient Details:  [name] [birth year] [id number] Applied Velcro knee immobilizer to RLE. Ortho Devices Type of Ortho Device: Knee Immobilizer Ortho Device/Splint Location: RLE Ortho Device/Splint Interventions: Application   Darrol Poke 06/20/2015, 4:31 PM

## 2015-06-20 NOTE — Anesthesia Preprocedure Evaluation (Addendum)
Anesthesia Evaluation  Patient identified by MRN, date of birth, ID band Patient awake    Reviewed: Allergy & Precautions, NPO status , Patient's Chart, lab work & pertinent test results  Airway Mallampati: II  TM Distance: >3 FB Neck ROM: Limited    Dental no notable dental hx.    Pulmonary COPD,  COPD inhaler, Current Smoker,    Pulmonary exam normal breath sounds clear to auscultation       Cardiovascular hypertension, + CAD, + Past MI and + Cardiac Stents  Normal cardiovascular exam Rhythm:Regular Rate:Normal     Neuro/Psych negative neurological ROS  negative psych ROS   GI/Hepatic negative GI ROS, (+)     substance abuse  alcohol use, Claims no alcohol in several months   Endo/Other  diabetes, Insulin Dependent  Renal/GU negative Renal ROS  negative genitourinary   Musculoskeletal negative musculoskeletal ROS (+)   Abdominal   Peds negative pediatric ROS (+)  Hematology negative hematology ROS (+)   Anesthesia Other Findings   Reproductive/Obstetrics negative OB ROS                            Anesthesia Physical Anesthesia Plan  ASA: III  Anesthesia Plan: General   Post-op Pain Management:    Induction: Intravenous  Airway Management Planned: Oral ETT  Additional Equipment:   Intra-op Plan:   Post-operative Plan: Extubation in OR  Informed Consent: I have reviewed the patients History and Physical, chart, labs and discussed the procedure including the risks, benefits and alternatives for the proposed anesthesia with the patient or authorized representative who has indicated his/her understanding and acceptance.   Dental advisory given  Plan Discussed with: CRNA and Surgeon  Anesthesia Plan Comments:         Anesthesia Quick Evaluation

## 2015-06-20 NOTE — Anesthesia Postprocedure Evaluation (Signed)
  Anesthesia Post-op Note  Patient: [name]  Procedure(s) Performed: Procedure(s): REPEAT IRRIGATION AND DEBRIDEMENT OF CERVICAL SPINE (N/A) APPLICATION OF INCISIONAL WOUND VAC (N/A)  Patient Location: PACU  Anesthesia Type: General   Level of Consciousness: awake, alert  and oriented  Airway and Oxygen Therapy: Patient Spontanous Breathing  Post-op Pain: mild  Post-op Assessment: Post-op Vital signs reviewed  Post-op Vital Signs: Reviewed  Last Vitals:  Filed Vitals:   06/20/15 1715  BP:   Pulse: 69  Temp:   Resp: 13    Complications: No apparent anesthesia complications

## 2015-06-20 NOTE — H&P (Signed)
History of Present Illness  The patient is a 69 year old female presenting for a post-operative visit. Patient is 6 weeks (and 3 days) postop following removee cervical plate, I & D abcess, fusion (on or about 05/04/15).Overall the patient feels that they are slightly worse ("I saw Dr Erik Obey on Friday and he squezzed it and got something out of it. He said Dr Rolena Infante should have a look at it"). There has been no post operative pain. The patient does report pain, tenderness (at the inferior aspect of the incision) and numbness (all finger tips from the mid IP joints to the tips), while the patient does not report pain with range of motion, wound dehiscence, drainage, fever, chills, redness or pain in the calf The patient does indicate that these symptoms are gradually worsening. Pain medications include: pain patch, Oxycontin, Neurontin . The patient is currently 100 percent weightbearing with the assistance of: cane. The patient is using a knee imobilizer.  Additional reasons for visit:  Transition into care is described as the following: The patient is transitioning into care from another physician (Dr Erik Obey saw her on 1/61/09 for a concern with her surgical site) .  Allergies PENICILLIN10/20/2004  Family History  Cerebrovascular Accident Father. Diabetes Mellitus Mother. Drug / Alcohol Addiction Father. Heart Disease Father. Hypertension Father. None09/10/2014 (Marked as Inactive)  Social History  Tobacco use Current every day smoker. 10/01/2013: smoke(d) 3/4 pack(s) per day No alcohol use Children 3 Current drinker 10/01/2013: Currently drinks hard liquor less than 5 times per week Current work status working full time Exercise Exercises never History of drug/alcohol rehab Living situation live with spouse Marital status married No history of drug/alcohol rehab Number of flights of stairs before winded less than 1 Under pain contract  Medication History   Duragesic-25 (25MCG/HR Patch 72HR, 1 (one) Patch 72HR Transdermal q 3 days, Taken starting 05/18/2015) Active. Neurontin (600MG  Tablet, 1 Oral 1 QID, Taken starting 04/26/2015) Active. Roxicodone (15MG  Tablet, 1 Oral four times daily, as needed, Taken starting 04/18/2015) Active. Januvia (25MG  Tablet, Oral) Active. CloNIDine HCl (0.2MG  Tablet, Oral) Active. Aspirin (81MG  Tablet, 1 (one) Oral) Active. Aspirin (325MG  Tablet, 1 Oral) Active. Nitroglycerin (0.4MG  Tab Sublingual, Sublingual) Active. (prn) Proventil HFA (108 (90 Base)MCG/ACT Aerosol Soln, Inhalation) Active. Simvastatin (10MG  Tablet, Oral) Active. Furosemide (40MG  Tablet, Oral) Active. Losartan Potassium (25MG  Tablet, Oral) Active. ALPRAZolam (0.25MG  Tablet, Oral) Active. (prn) Medications Reconciled   Objective Transcription She presents today for followup. We are now status post an I and D of her wound infection and removal of plate for esophageal perforation. On the inferior aspect of the incision, there is still some purulent drainage. She states Dr. Erik Obey had already done a bedside I and D twice and expressed pus, and I have expressed pus today. While she has had no fevers or chills, I am concerned about the recurrence of the abscess. No SOB/CP Tolerating liquids Abd soft/nt  Neuro intact No fevers/chills   Assessment & Plan  Given that I think the best course of action is simply returning to the OR this Wednesday to reopen the wound, wash it out, close it and apply incisional VAC to help try and get this to heal uneventfully. I have talked with the patient and her husband. They are aware of the risks which include infection, bleeding, nerve damage, death, stroke, paralysis, failure to heal, need for further surgery, ongoing or worse pain. At this point, though I think the best course of action is washing it out, cleaning  the environment, and then placing an incisional VAC. We plan on doing this on this  Wednesday in the operating room. More than likely she will spend the night, go home the next day.

## 2015-06-20 NOTE — Transfer of Care (Signed)
Immediate Anesthesia Transfer of Care Note  Patient: [name]  Procedure(s) Performed: Procedure(s): REPEAT IRRIGATION AND DEBRIDEMENT OF CERVICAL SPINE (N/A) APPLICATION OF INCISIONAL WOUND VAC (N/A)  Patient Location: PACU  Anesthesia Type:General  Level of Consciousness: awake, alert , oriented and patient cooperative  Airway & Oxygen Therapy: Patient Spontanous Breathing and Patient connected to face mask oxygen  Post-op Assessment: Report given to RN, Post -op Vital signs reviewed and stable, Patient moving all extremities, Patient moving all extremities X 4 and Patient able to stick tongue midline  Post vital signs: Reviewed and stable  Last Vitals:  Filed Vitals:   06/20/15 1238  BP: 155/76  Pulse: 126  Temp: 36.8 C  Resp: 20    Complications: No apparent anesthesia complications

## 2015-06-20 NOTE — Progress Notes (Signed)
ANTIBIOTIC CONSULT NOTE - INITIAL  Pharmacy Consult for Vancomycin Indication: surgical prophylaxis  Allergies  Allergen Reactions  . Penicillins Rash  . Zithromax [Azithromycin Dihydrate] Other (See Comments)    ORAL ULCERS     Patient Measurements: Height: 5' 3.5" (161.3 cm) Weight: 119 lb (53.978 kg) IBW/kg (Calculated) : 53.55 Adjusted Body Weight:   Vital Signs: Temp: 98.3 F (36.8 C) (10/05 1841) Temp Source: Oral (10/05 1238) BP: 129/77 mmHg (10/05 1841) Pulse Rate: 80 (10/05 1841) Intake/Output from previous day:   Intake/Output from this shift: Total I/O In: 1000 [I.V.:1000] Out: -   Labs:  Recent Labs  06/20/15 1247  WBC 10.7*  HGB 12.8  PLT 309  CREATININE 0.56   Estimated Creatinine Clearance: 57.7 mL/min (by C-G formula based on Cr of 0.56). No results for input(s): VANCOTROUGH, VANCOPEAK, VANCORANDOM, GENTTROUGH, GENTPEAK, GENTRANDOM, TOBRATROUGH, TOBRAPEAK, TOBRARND, AMIKACINPEAK, AMIKACINTROU, AMIKACIN in the last 72 hours.   Microbiology: No results found for this or any previous visit (from the past 720 hour(s)).  Medical History: Past Medical History  Diagnosis Date  . Myocardial infarction ([place]) 2000  . Diabetes mellitus type 2, insulin dependent ([place])   . COPD (chronic obstructive pulmonary disease) ([place])   . Hyperlipidemia   . OA (osteoarthritis) of knee   . Hypertension   . Coronary artery disease   . Asthma   . Elevated LFTs   . ETOH abuse   . Tobacco abuse   . Osteoarthritis   . Breast cancer ([place])     Medications:  Prescriptions prior to admission  Medication Sig Dispense Refill Last Dose  . albuterol (PROVENTIL HFA;VENTOLIN HFA) 108 (90 BASE) MCG/ACT inhaler Inhale 2 puffs into the lungs every 6 (six) hours as needed for wheezing.    Past Week at Unknown time  . ALPRAZolam (XANAX) 0.25 MG tablet Take one tablet daily or as needed (Patient taking differently: Take 0.25 mg by mouth daily as needed for anxiety. ) 90 tablet  3 06/19/2015 at Unknown time  . aspirin EC 81 MG tablet Take 1 tablet (81 mg total) by mouth daily. 30 tablet 0 Past Week at Unknown time  . diclofenac (VOLTAREN) 75 MG EC tablet Take 75 mg by mouth daily as needed for mild pain.    Past Week at Unknown time  . fentaNYL (DURAGESIC - DOSED MCG/HR) 25 MCG/HR patch Place 1 patch (25 mcg total) onto the skin every 3 (three) days. 5 patch 0 06/20/2015 at 0730  . furosemide (LASIX) 40 MG tablet Take 1 tablet (40 mg total) by mouth 2 (two) times daily. (Patient taking differently: Take 40 mg by mouth daily. ) 180 tablet 3 06/19/2015 at Unknown time  . gabapentin (NEURONTIN) 600 MG tablet Take 600 mg by mouth 4 (four) times daily.    06/20/2015 at 1115  . JANUVIA 25 MG tablet Take 12.5 mg by mouth daily.    06/20/2015 at 0700  . loperamide (IMODIUM) 2 MG capsule Take 2 mg by mouth 4 (four) times daily. With feedins  [id number]   06/20/2015 at 0700  . losartan (COZAAR) 25 MG tablet Take 25 mg by mouth daily.   1 06/19/2015 at Unknown time  . meropenem 1 g in sodium chloride 0.9 % 100 mL Inject 1 g into the vein every 8 (eight) hours. Last dose on 06/14/15 120 vial 0 06/20/2015 at 0730  . Nutritional Supplements (FEEDING SUPPLEMENT, JEVITY 1.2 CAL,) LIQD Give 2 cans three times daily via PEG (Patient taking differently: Take 237  mLs by mouth See admin instructions. Give 2 cans three times daily via PEG) 237 mL 99 06/19/2015 at Unknown time  . oxyCODONE (ROXICODONE) 15 MG immediate release tablet Take 15 mg by mouth every 6 (six) hours as needed for pain.    06/20/2015 at 1130  . tiZANidine (ZANAFLEX) 4 MG tablet Take 4 mg by mouth every 8 (eight) hours as needed for muscle spasms.   0 Past Week at Unknown time  . Water For Irrigation, Sterile (FREE WATER) SOLN Place 60 mLs into feeding tube every 8 (eight) hours. Flush BEFORE AND AFTER EACH JEVITY ADMINISTRATION 1000 mL 99 06/19/2015 at Unknown time  . albuterol (PROVENTIL) (2.5 MG/3ML) 0.083% nebulizer solution  Take 2.5 mg by nebulization every 6 (six) hours as needed for wheezing or shortness of breath.    More than a month at Unknown time   Scheduled:  . aspirin EC  81 mg Oral Daily  . docusate sodium  100 mg Oral BID  . feeding supplement (JEVITY 1.2 CAL)  237 mL Oral See admin instructions  . fentaNYL  25 mcg Transdermal Q72H  . free water  60 mL Per Tube 3 times per day  . furosemide  40 mg Oral Daily  . gabapentin  600 mg Oral QID  . HYDROmorphone      . linagliptin  5 mg Oral Daily  . losartan  25 mg Oral Daily  . promethazine      . sodium chloride  3 mL Intravenous Q12H   Infusions:  . sodium chloride    . lactated ringers 10 mL/hr at 06/20/15 1309   Assessment: 69yo female here for I&D of cervical spine and application of wound vac. Pharmacy is consulted to dose vancomycin for surgical prophylaxis. Pt is afebrile, WBC 10.7, sCr 0.56  Pt received vancomycin 1g IV once pre-op.  Goal of Therapy:  Vancomycin trough level 15-20 mcg/ml  Plan:  Vancomycin 500mg  IV q12h Measure antibiotic drug levels at steady state Follow up culture results, renal function and clinical course  Andrey Cota. Diona Foley, PharmD Clinical Pharmacist Pager [phone] 06/20/2015,6:58 PM

## 2015-06-20 NOTE — Brief Op Note (Signed)
06/20/2015  4:10 PM  PATIENT:  [name]  69 y.o. female  PRE-OPERATIVE DIAGNOSIS:  WOUND INFECTION AND ESOPHOGEAL PERFORALS  POST-OPERATIVE DIAGNOSIS:  WOUND INFECTION AND ESOPHOGEAL PERFORALS  PROCEDURE:  Procedure(s): REPEAT IRRIGATION AND DEBRIDEMENT OF CERVICAL SPINE (N/A) APPLICATION OF INCISIONAL WOUND VAC (N/A)  SURGEON:  Surgeon(s) and Role:    * Melina Schools, MD - Primary  PHYSICIAN ASSISTANT:   ASSISTANTS: Carmen Mayo   ANESTHESIA:   general  EBL:  Total I/O In: 1000 [I.V.:1000] Out: -   BLOOD ADMINISTERED:none  DRAINS: none   LOCAL MEDICATIONS USED:  NONE  SPECIMEN:  No Specimen  DISPOSITION OF SPECIMEN:  N/A  COUNTS:  YES  TOURNIQUET:  * No tourniquets in log *  DICTATION: .Other Dictation: Dictation Number [id number]  PLAN OF CARE: Admit for overnight observation  PATIENT DISPOSITION:  PACU - hemodynamically stable.

## 2015-06-20 NOTE — Progress Notes (Signed)
Dr. Kalman Shan called and informed of abnormal chest xray in July, repeat ordered for today.

## 2015-06-21 ENCOUNTER — Encounter (HOSPITAL_COMMUNITY): Payer: Self-pay | Admitting: Orthopedic Surgery

## 2015-06-21 ENCOUNTER — Observation Stay (HOSPITAL_COMMUNITY): Payer: Medicare Other

## 2015-06-21 DIAGNOSIS — F17210 Nicotine dependence, cigarettes, uncomplicated: Secondary | ICD-10-CM | POA: Diagnosis not present

## 2015-06-21 DIAGNOSIS — Z79891 Long term (current) use of opiate analgesic: Secondary | ICD-10-CM | POA: Diagnosis not present

## 2015-06-21 DIAGNOSIS — J449 Chronic obstructive pulmonary disease, unspecified: Secondary | ICD-10-CM | POA: Diagnosis not present

## 2015-06-21 DIAGNOSIS — T814XXA Infection following a procedure, initial encounter: Secondary | ICD-10-CM | POA: Diagnosis not present

## 2015-06-21 DIAGNOSIS — Z79899 Other long term (current) drug therapy: Secondary | ICD-10-CM | POA: Diagnosis not present

## 2015-06-21 DIAGNOSIS — Z7982 Long term (current) use of aspirin: Secondary | ICD-10-CM | POA: Diagnosis not present

## 2015-06-21 DIAGNOSIS — K223 Perforation of esophagus: Secondary | ICD-10-CM | POA: Diagnosis not present

## 2015-06-21 LAB — BASIC METABOLIC PANEL
Anion gap: 5 (ref 5–15)
BUN: 11 mg/dL (ref 6–20)
CALCIUM: 8.6 mg/dL — AB (ref 8.9–10.3)
CO2: 28 mmol/L (ref 22–32)
CREATININE: 0.4 mg/dL — AB (ref 0.44–1.00)
Chloride: 101 mmol/L (ref 101–111)
GFR calc Af Amer: >60 mL/min (ref >60)
GLUCOSE: 105 mg/dL — AB (ref 65–99)
Potassium: 4 mmol/L (ref 3.5–5.1)
Sodium: 134 mmol/L — ABNORMAL LOW (ref 135–145)

## 2015-06-21 MED ORDER — SODIUM CHLORIDE 0.9 % IV SOLN
1.0000 g | Freq: Three times a day (TID) | INTRAVENOUS | Status: DC
  Administered 2015-06-21: 1 g via INTRAVENOUS
  Filled 2015-06-21 (×3): qty 1

## 2015-06-21 MED ORDER — IOHEXOL 300 MG/ML  SOLN
150.0000 mL | Freq: Once | INTRAMUSCULAR | Status: DC | PRN
  Administered 2015-06-21: 50 mL via ORAL
  Filled 2015-06-21: qty 150

## 2015-06-21 MED ORDER — HEPARIN SOD (PORK) LOCK FLUSH 100 UNIT/ML IV SOLN
250.0000 [IU] | INTRAVENOUS | Status: AC | PRN
  Administered 2015-06-21: 250 [IU]

## 2015-06-21 MED ORDER — DOCUSATE SODIUM 100 MG PO CAPS: 100.0000 mg | ORAL_CAPSULE | Freq: Two times a day (BID) | ORAL | Status: DC

## 2015-06-21 NOTE — Op Note (Signed)
NAMEOUIDA, ABEYTA                ACCOUNT NO.:  [id number]  MEDICAL RECORD NO.:  [id number]  LOCATION:  5N06C                        FACILITY:  [facility]  PHYSICIAN:  Brittanni Cariker D. Rolena Infante, M.D. DATE OF BIRTH:  [birth year]  DATE OF PROCEDURE:  06/20/2015 DATE OF DISCHARGE:                              OPERATIVE REPORT   PREOPERATIVE DIAGNOSIS:  Wound infection, cervical spine.  POSTOPERATIVE DIAGNOSIS:  Wound infection, cervical spine.  PROCEDURE:  Incision and drainage of right wound with application of incisional wound VAC.  COMPLICATIONS:  None.  CONDITION:  Stable.  HISTORY:  This is a very pleasant 69 year old woman, who 6 years ago had a multi-level ACDF and then most recently was noted to have esophageal perforation with cervical paraspinal abscess.  The patient underwent removal of hardware and I and D.  Postoperatively, she has had some superficial purulent drainage from the wound.  As a result of this, I elected to take her back to the operating room simply to do I and D of the wound.  All appropriate risks, benefits, and alternatives were explained to the patient.  Consent was obtained.  FIRST ASSISTANT:  [place], [place]  OPERATIVE NOTE:  The patient was brought to the operating room, placed supine on the operating table.  After successful induction of general anesthesia and endotracheal intubation, TEDs, SCDs were applied and the anterior cervical spine was prepped and draped in a standard fashion. Time-out was taken confirming patient, procedure, and all other pertinent important data.  Once this was completed, the previous right- sided __________ longitudinal incision was re-incised.  The Monocryl was removed and the wound edges were freshened.  I dissected down to the platysma and gently incised the platysma and there was no purulent drainage coming up from the deep cervical or prevertebral fascia.  At this point, I then irrigated the wound copiously with normal  saline and then closed the wound with a running 3-0 PDS suture.  I then applied an incisional wound VAC.  This was then connected to the suction and it was functioning without difficulty.  The patient was then extubated, transferred to PACU without incident.  At the end of the case, all needle and sponge counts were correct.  She remained neurologically intact.     Arnoldo Hildreth D. Rolena Infante, M.D.    DDB/MEDQ  D:  06/20/2015  T:  06/21/2015  Job:  [id number]

## 2015-06-21 NOTE — Progress Notes (Signed)
[facility]  Patient Status: Active pt with [facility] prior to this admission  [facility] is providing the following services:  HHRN and [facility] for home IV ABX.  [facility] hospital team will follow pt while inpatient and support DC plan as ordered.  If patient discharges after hours, please call [phone].   [name] 06/21/2015, 9:03 AM

## 2015-06-21 NOTE — Progress Notes (Addendum)
Patient ID: [name], female   DOB: 23-Dec-1945, 69 y.o.   MRN: [id number]    Subjective: 1 Day Post-Op Procedure(s) (LRB): REPEAT IRRIGATION AND DEBRIDEMENT OF CERVICAL SPINE (N/A) APPLICATION OF INCISIONAL WOUND VAC (N/A) Patient reports pain as 2 on 0-10 scale.   Denies CP or SOB.  Voiding without difficulty. Positive flatus. Objective: Vital signs in last 24 hours: Temp:  [97.5 F (36.4 C)-98.3 F (36.8 C)] 98.2 F (36.8 C) (10/06 0444) Pulse Rate:  [55-126] 57 (10/06 0444) Resp:  [13-20] 18 (10/06 0444) BP: (103-155)/(48-92) 127/62 mmHg (10/06 0444) SpO2:  [93 %-97 %] 94 % (10/06 0444) Weight:  [53.978 kg (119 lb)] 53.978 kg (119 lb) (10/05 1259)  Intake/Output from previous day: 10/05 0701 - 10/06 0700 In: 1517.5 [I.V.:1155.4; NG/GT:262.1; IV Piggyback:100] Out: -  Intake/Output this shift:    Labs:  Recent Labs  06/20/15 1247  HGB 12.8    Recent Labs  06/20/15 1247  WBC 10.7*  RBC 4.51  HCT 37.4  PLT 309    Recent Labs  06/20/15 1247  NA 133*  K 4.2  CL 97*  CO2 25  BUN 18  CREATININE 0.56  GLUCOSE 151*  CALCIUM 9.4   No results for input(s): LABPT, INR in the last 72 hours.  Physical Exam: Neurologically intact ABD soft Neurovascular intact Sensation intact distally Dorsiflexion/Plantar flexion intact Incision: dressing C/D/I and pt wearing a wound vac Compartment soft  Assessment/Plan: 1 Day Post-Op Procedure(s) (LRB): REPEAT IRRIGATION AND DEBRIDEMENT OF CERVICAL SPINE (N/A) APPLICATION OF INCISIONAL WOUND VAC (N/A) Up with therapy  Continue to wear knee immobilizer - pt has history of right hip dislocation Pt will have a barium swallow test this am Up with PT Continue clear liquid diet Consider DC home today - will Discuss with Dr. Jalene Mullet, Darla Lesches for Dr. Melina Schools [facility] [phone] 06/21/2015, 7:39 AM  Spoke with Dr Erik Obey - negative water soluble swallow study.  recommend  barium swallow as it is more sensitive test for leak  Plan on d/c to home following test

## 2015-06-21 NOTE — Progress Notes (Signed)
OT Cancellation Note  Patient Details Name: [name] MRN: [id number] DOB: Nov 06, 1945   Cancelled Treatment:    Reason Eval/Treat Not Completed: OT screened, no needs identified, will sign off. Pt declined acute OT services, reports that she has been getting [facility] for some time now and was d/c from them this past Tuesday. Pt reports that she has no need for OT services at this time and feels comfortable completing ADLs and functional mobility upon d/c home with assist from her husband. Will sign off from an OT standpoint at this time. Please re-consult OT if change in medical status occurs. Thank you for this referral.   Binnie Kand M.S., OTR/L Pager: [phone]  06/21/2015, 1:49 PM

## 2015-06-21 NOTE — Evaluation (Signed)
Physical Therapy Evaluation Patient Details Name: [name] MRN: [id number] DOB: Sep 02, 1946 Today's Date: 06/21/2015   History of Present Illness  Pt is a 69 y/o F who is 6 wks postop removal of cervical plate, I and D abscess, and fusion. Pt had I & D on 06/20/15 and wound vac placement.  Pt's PMH includes h/o Rt hip dislocation (wears Rt KI), MI, DM II, COPD, HTN, CAD, asthma, Elevated LFTs, ETOH abuse, breast cancer.   Clinical Impression  Pt admitted with above diagnosis. Pt currently with functional limitations due to the deficits listed below (see PT Problem List). [name] is refusing HHPT at d/c but will benefit from skilled PT to increase their independence and safety with mobility to allow discharge to the venue listed below. She demonstrates mild instability w/ standing and staggering Lt and Rt while "couch surfing" ambulating in hallway.      Follow Up Recommendations Home health PT;Supervision for mobility/OOB    Equipment Recommendations  None recommended by PT    Recommendations for Other Services       Precautions / Restrictions Precautions Precautions: Fall;Posterior Hip Precaution Booklet Issued: No Precaution Comments: Pt able to recall 3/3 hip precautions w/o assist Required Braces or Orthoses: Knee Immobilizer - Right Knee Immobilizer - Right: On except when in CPM Restrictions Weight Bearing Restrictions: No      Mobility  Bed Mobility Overal bed mobility: Modified Independent             General bed mobility comments: HOB elevated and pt requires increased time. Cues provided for technique to avoid twisting or arching neck.  Transfers Overall transfer level: Needs assistance Equipment used: None Transfers: Sit to/from Stand Sit to Stand: Supervision         General transfer comment: Supervision for safety.  Pt is quick to stand w/ mild instability noted.    Ambulation/Gait Ambulation/Gait assistance: Min guard Ambulation Distance  (Feet): 150 Feet Assistive device: None Gait Pattern/deviations: Antalgic;Step-through pattern;Trunk flexed;Staggering left;Staggering right   Gait velocity interpretation: Below normal speed for age/gender General Gait Details: Pt occassionally staggering to Lt and Rt and "couch surfing" reaching out for chairs and wall railings.    Stairs            Wheelchair Mobility    Modified Rankin (Stroke Patients Only)       Balance Overall balance assessment: Needs assistance Sitting-balance support: No upper extremity supported;Feet supported Sitting balance-Leahy Scale: Good     Standing balance support: No upper extremity supported;During functional activity Standing balance-Leahy Scale: Poor                               Pertinent Vitals/Pain Pain Assessment: Faces Faces Pain Scale: Hurts a little bit Pain Location: neck Pain Descriptors / Indicators: Aching Pain Intervention(s): Monitored during session;Limited activity within patient's tolerance;Repositioned    Home Living Family/patient expects to be discharged to:: Private residence Living Arrangements: Spouse/significant other Available Help at Discharge: Family;Available 24 hours/day (husband) Type of Home: House Home Access: Level entry     Home Layout: One level Home Equipment: Walker - 2 wheels;Bedside commode;Shower seat - built in;Adaptive equipment;Cane - single point Additional Comments: Pt says she all necessary equipment.      Prior Function Level of Independence: Needs assistance   Gait / Transfers Assistance Needed: PTA pt uses cane only prn when leaving home  ADL's / Homemaking Assistance Needed: assist from husband with cooking,  laundry        Hand Dominance        Extremity/Trunk Assessment   Upper Extremity Assessment: Generalized weakness           Lower Extremity Assessment: Generalized weakness;RLE deficits/detail RLE Deficits / Details: h/o Rt hip  dislocation.  To return for repair of Rt hip once concluded there is no infection present.    Cervical / Trunk Assessment: Kyphotic  Communication   Communication: No difficulties  Cognition Arousal/Alertness: Awake/alert Behavior During Therapy: WFL for tasks assessed/performed Overall Cognitive Status: Within Functional Limits for tasks assessed                      General Comments General comments (skin integrity, edema, etc.): Pt has received PT in the past and says she has a contact at [facility] whom she agrees to contact if she begins to notice she is losing her balance and tripping.  Pt refusing therapist recommendation of HHPT at d/c and says, "I'm done with all of that".  She says she is just ready to feel better.    Exercises        Assessment/Plan    PT Assessment Patient needs continued PT services  PT Diagnosis Difficulty walking;Abnormality of gait;Generalized weakness;Acute pain   PT Problem List Decreased strength;Decreased range of motion;Decreased activity tolerance;Decreased balance;Decreased mobility;Decreased coordination;Decreased knowledge of use of DME;Decreased safety awareness;Decreased knowledge of precautions;Decreased skin integrity;Pain  PT Treatment Interventions DME instruction;Gait training;Stair training;Functional mobility training;Therapeutic activities;Therapeutic exercise;Balance training;Neuromuscular re-education;Patient/family education;Modalities   PT Goals (Current goals can be found in the Care Plan section) Acute Rehab PT Goals Patient Stated Goal: to go home and feel better PT Goal Formulation: With patient Time For Goal Achievement: 07/05/15 Potential to Achieve Goals: Good    Frequency Min 3X/week   Barriers to discharge        Co-evaluation               End of Session   Activity Tolerance: Patient tolerated treatment well Patient left: in bed;with call bell/phone within reach Nurse Communication:  Mobility status;Precautions    Functional Assessment Tool Used: Clinical Judgement Functional Limitation: Mobility: Walking and moving around Mobility: Walking and Moving Around Current Status (Q5956): At least 1 percent but less than 20 percent impaired, limited or restricted Mobility: Walking and Moving Around Goal Status [phone]): 0 percent impaired, limited or restricted    Time: 4332-9518 PT Time Calculation (min) (ACUTE ONLY): 18 min   Charges:   PT Evaluation $Initial PT Evaluation Tier I: 1 Procedure     PT G Codes:   PT G-Codes **NOT FOR INPATIENT CLASS** Functional Assessment Tool Used: Clinical Judgement Functional Limitation: Mobility: Walking and moving around Mobility: Walking and Moving Around Current Status (A4166): At least 1 percent but less than 20 percent impaired, limited or restricted Mobility: Walking and Moving Around Goal Status [phone]): 0 percent impaired, limited or restricted   Michail Jewels PT, DPT [phone] Pager: [phone] 06/21/2015, 2:12 PM

## 2015-06-21 NOTE — Clinical Social Work Note (Signed)
CSW consult acknowledged:  Clinical Education officer, museum received a consult for SNF placement. PT currently recommending Home Health.  Clinical Social Worker will sign off for now as social work intervention is no longer needed. Please consult [place] again if new need arises.  Glendon Axe, MSW, LCSWA [phone] 06/21/2015 2:48 PM

## 2015-06-21 NOTE — Care Management Note (Signed)
Case Management Note  Patient Details  Name: [name] MRN: [id number] Date of Birth: [birth year]  Subjective/Objective:  69 yr old female admitted with cervical spine infection, s/p I & D with application of wound VAC.                 Action/Plan:  Patient used [place] in August , will do so now. Referral was called to Lynnda Shields, RN [facility] Liaison.    Expected Discharge Date:                  Expected Discharge Plan:   Home with [facility]  In-House Referral:     Discharge planning Services     Post Acute Care Choice:    Choice offered to:     DME Arranged:    DME Agency:     HH Arranged:    [place] Agency:     Status of Service:     Medicare Important Message Given:    Date Medicare IM Given:    Medicare IM give by:    Date Additional Medicare IM Given:    Additional Medicare Important Message give by:     If discussed at [place] of Stay Meetings, dates discussed:    Additional Comments:  Ninfa Meeker, RN 06/21/2015, 2:46 PM

## 2015-06-25 ENCOUNTER — Other Ambulatory Visit: Payer: Medicare Other

## 2015-06-25 DIAGNOSIS — J853 Abscess of mediastinum: Secondary | ICD-10-CM | POA: Diagnosis not present

## 2015-06-25 DIAGNOSIS — M4622 Osteomyelitis of vertebra, cervical region: Secondary | ICD-10-CM | POA: Diagnosis not present

## 2015-06-25 DIAGNOSIS — Z434 Encounter for attention to other artificial openings of digestive tract: Secondary | ICD-10-CM | POA: Diagnosis not present

## 2015-06-25 DIAGNOSIS — T849XXD Unspecified complication of internal orthopedic prosthetic device, implant and graft, subsequent encounter: Secondary | ICD-10-CM | POA: Diagnosis not present

## 2015-06-25 DIAGNOSIS — T84020D Dislocation of internal right hip prosthesis, subsequent encounter: Secondary | ICD-10-CM | POA: Diagnosis not present

## 2015-06-25 DIAGNOSIS — Z452 Encounter for adjustment and management of vascular access device: Secondary | ICD-10-CM | POA: Diagnosis not present

## 2015-06-26 ENCOUNTER — Other Ambulatory Visit (HOSPITAL_COMMUNITY): Payer: Medicare Other

## 2015-06-26 ENCOUNTER — Other Ambulatory Visit: Payer: Medicare Other

## 2015-06-26 DIAGNOSIS — Z4789 Encounter for other orthopedic aftercare: Secondary | ICD-10-CM | POA: Diagnosis not present

## 2015-06-26 DIAGNOSIS — Z9889 Other specified postprocedural states: Secondary | ICD-10-CM | POA: Diagnosis not present

## 2015-06-27 ENCOUNTER — Encounter (HOSPITAL_COMMUNITY): Payer: Self-pay | Admitting: *Deleted

## 2015-06-28 ENCOUNTER — Ambulatory Visit (HOSPITAL_COMMUNITY): Payer: Medicare Other | Admitting: Certified Registered Nurse Anesthetist

## 2015-06-28 ENCOUNTER — Encounter (HOSPITAL_COMMUNITY)
Admission: RE | Disposition: A | Discharge status: Home / Self Care | Payer: Self-pay | Source: Ambulatory Visit | Attending: Infectious Disease

## 2015-06-28 ENCOUNTER — Encounter (HOSPITAL_COMMUNITY): Payer: Self-pay | Admitting: *Deleted

## 2015-06-28 ENCOUNTER — Ambulatory Visit (HOSPITAL_COMMUNITY)
Admission: RE | Admit: 2015-06-28 | Discharge: 2015-06-28 | Disposition: A | Discharge status: Home / Self Care | Payer: Medicare Other | Source: Ambulatory Visit | Attending: Infectious Disease | Admitting: Infectious Disease

## 2015-06-28 DIAGNOSIS — M4642 Discitis, unspecified, cervical region: Secondary | ICD-10-CM | POA: Diagnosis not present

## 2015-06-28 DIAGNOSIS — F17200 Nicotine dependence, unspecified, uncomplicated: Secondary | ICD-10-CM | POA: Diagnosis not present

## 2015-06-28 DIAGNOSIS — M869 Osteomyelitis, unspecified: Secondary | ICD-10-CM | POA: Diagnosis not present

## 2015-06-28 DIAGNOSIS — I2510 Atherosclerotic heart disease of native coronary artery without angina pectoris: Secondary | ICD-10-CM | POA: Insufficient documentation

## 2015-06-28 DIAGNOSIS — M25551 Pain in right hip: Secondary | ICD-10-CM

## 2015-06-28 DIAGNOSIS — Z96641 Presence of right artificial hip joint: Secondary | ICD-10-CM | POA: Insufficient documentation

## 2015-06-28 DIAGNOSIS — I10 Essential (primary) hypertension: Secondary | ICD-10-CM | POA: Insufficient documentation

## 2015-06-28 DIAGNOSIS — J449 Chronic obstructive pulmonary disease, unspecified: Secondary | ICD-10-CM | POA: Insufficient documentation

## 2015-06-28 DIAGNOSIS — Z981 Arthrodesis status: Secondary | ICD-10-CM | POA: Diagnosis not present

## 2015-06-28 DIAGNOSIS — Z96649 Presence of unspecified artificial hip joint: Secondary | ICD-10-CM

## 2015-06-28 DIAGNOSIS — J45909 Unspecified asthma, uncomplicated: Secondary | ICD-10-CM | POA: Diagnosis not present

## 2015-06-28 DIAGNOSIS — T84029D Dislocation of unspecified internal joint prosthesis, subsequent encounter: Secondary | ICD-10-CM

## 2015-06-28 DIAGNOSIS — M4620 Osteomyelitis of vertebra, site unspecified: Secondary | ICD-10-CM | POA: Diagnosis not present

## 2015-06-28 DIAGNOSIS — Z955 Presence of coronary angioplasty implant and graft: Secondary | ICD-10-CM | POA: Insufficient documentation

## 2015-06-28 DIAGNOSIS — J853 Abscess of mediastinum: Secondary | ICD-10-CM

## 2015-06-28 DIAGNOSIS — E119 Type 2 diabetes mellitus without complications: Secondary | ICD-10-CM | POA: Insufficient documentation

## 2015-06-28 DIAGNOSIS — K223 Perforation of esophagus: Secondary | ICD-10-CM

## 2015-06-28 DIAGNOSIS — R579 Shock, unspecified: Secondary | ICD-10-CM

## 2015-06-28 DIAGNOSIS — M4322 Fusion of spine, cervical region: Secondary | ICD-10-CM | POA: Diagnosis not present

## 2015-06-28 DIAGNOSIS — I252 Old myocardial infarction: Secondary | ICD-10-CM | POA: Insufficient documentation

## 2015-06-28 DIAGNOSIS — M4643 Discitis, unspecified, cervicothoracic region: Secondary | ICD-10-CM

## 2015-06-28 DIAGNOSIS — M4622 Osteomyelitis of vertebra, cervical region: Secondary | ICD-10-CM

## 2015-06-28 DIAGNOSIS — M1990 Unspecified osteoarthritis, unspecified site: Secondary | ICD-10-CM | POA: Insufficient documentation

## 2015-06-28 HISTORY — DX: Personal history of other diseases of the circulatory system: Z86.79

## 2015-06-28 HISTORY — PX: RADIOLOGY WITH ANESTHESIA: SHX6223

## 2015-06-28 LAB — GLUCOSE, CAPILLARY
Glucose-Capillary: 93 mg/dL (ref 65–99)
Glucose-Capillary: 81 mg/dL (ref 65–99)

## 2015-06-28 SURGERY — RADIOLOGY WITH ANESTHESIA
Anesthesia: Monitor Anesthesia Care | Laterality: Right

## 2015-06-28 MED ORDER — GADOBENATE DIMEGLUMINE 529 MG/ML IV SOLN
10.0000 mL | Freq: Once | INTRAVENOUS | Status: AC | PRN
  Administered 2015-06-28: 10 mL via INTRAVENOUS

## 2015-06-28 MED ORDER — LACTATED RINGERS IV SOLN
INTRAVENOUS | Status: DC
  Administered 2015-06-28: 10 mL/h via INTRAVENOUS

## 2015-06-28 NOTE — Anesthesia Postprocedure Evaluation (Signed)
  Anesthesia Post-op Note  Patient: [name]  Procedure(s) Performed: Procedure(s) (LRB): MRI OF CERVICAL SPINE  AND RIGHT HIP  WITH AND WITHOUT CONTRAST    (RADIOLOGY WITH ANESTHESIA) (Right)  Patient Location: PACU  Anesthesia Type: MAC  Level of Consciousness: awake and alert   Airway and Oxygen Therapy: Patient Spontanous Breathing  Post-op Pain: mild  Post-op Assessment: Post-op Vital signs reviewed, Patient's Cardiovascular Status Stable, Respiratory Function Stable, Patent Airway and No signs of Nausea or vomiting  Last Vitals:  Filed Vitals:   06/28/15 1435  BP:   Pulse: 66  Temp: 36.1 C    Post-op Vital Signs: stable   Complications: No apparent anesthesia complications

## 2015-06-28 NOTE — Anesthesia Preprocedure Evaluation (Addendum)
Anesthesia Evaluation  Patient identified by MRN, date of birth, ID band Patient awake    Reviewed: Allergy & Precautions, NPO status , Patient's Chart, lab work & pertinent test results  Airway Mallampati: II  TM Distance: >3 FB Neck ROM: Limited    Dental no notable dental hx. (+) Dental Advisory Given, Lower Dentures, Upper Dentures   Pulmonary asthma , COPD,  COPD inhaler, Current Smoker,    Pulmonary exam normal breath sounds clear to auscultation       Cardiovascular Exercise Tolerance: Poor hypertension, (-) angina+ CAD, + Past MI and + Cardiac Stents  Normal cardiovascular exam Rhythm:Regular Rate:Normal     Neuro/Psych negative neurological ROS  negative psych ROS   GI/Hepatic negative GI ROS, (+)     substance abuse  alcohol use, Claims no alcohol in several months   Endo/Other  diabetes, Type 2  Renal/GU negative Renal ROS  negative genitourinary   Musculoskeletal  (+) Arthritis , Osteoarthritis,    Abdominal   Peds negative pediatric ROS (+)  Hematology negative hematology ROS (+)   Anesthesia Other Findings   Reproductive/Obstetrics negative OB ROS                          Anesthesia Physical Anesthesia Plan  ASA: III  Anesthesia Plan: MAC   Post-op Pain Management:    Induction: Intravenous  Airway Management Planned: Nasal Cannula  Additional Equipment:   Intra-op Plan:   Post-operative Plan:   Informed Consent: I have reviewed the patients History and Physical, chart, labs and discussed the procedure including the risks, benefits and alternatives for the proposed anesthesia with the patient or authorized representative who has indicated his/her understanding and acceptance.   Dental advisory given  Plan Discussed with: CRNA  Anesthesia Plan Comments: (Discussed risks/benefits/alternatives to MAC sedation including need for ventilatory support,  hypotension, need for conversion to general anesthesia.  All patient questions answered.  Patient wished to proceed.)       Anesthesia Quick Evaluation

## 2015-06-28 NOTE — Transfer of Care (Signed)
Immediate Anesthesia Transfer of Care Note  Patient: [name]  Procedure(s) Performed: Procedure(s): MRI OF CERVICAL SPINE  AND RIGHT HIP  WITH AND WITHOUT CONTRAST    (RADIOLOGY WITH ANESTHESIA) (Right)  Patient Location: PACU  Anesthesia Type:MAC  Level of Consciousness: awake, alert , oriented and patient cooperative  Airway & Oxygen Therapy: Patient Spontanous Breathing  Post-op Assessment: Report given to RN, Post -op Vital signs reviewed and stable and Patient moving all extremities  Post vital signs: Reviewed and stable  Last Vitals:  Filed Vitals:   06/28/15 0815  BP: 106/69  Pulse: 83  Temp: 37.5 C    Complications: No apparent anesthesia complications

## 2015-06-28 NOTE — Progress Notes (Signed)
Spoke with Dr.Moser who stated it was okay to use picc line for IV fluids.

## 2015-06-28 NOTE — H&P (Signed)
Patient had anesthesia today with her MRI and Epic flagging me to write an H&P  Please see my most recent clinic note re why these MRIS were performed.

## 2015-06-29 ENCOUNTER — Encounter (HOSPITAL_COMMUNITY): Payer: Self-pay | Admitting: Radiology

## 2015-07-02 DIAGNOSIS — Z434 Encounter for attention to other artificial openings of digestive tract: Secondary | ICD-10-CM | POA: Diagnosis not present

## 2015-07-02 DIAGNOSIS — T849XXD Unspecified complication of internal orthopedic prosthetic device, implant and graft, subsequent encounter: Secondary | ICD-10-CM | POA: Diagnosis not present

## 2015-07-02 DIAGNOSIS — M4622 Osteomyelitis of vertebra, cervical region: Secondary | ICD-10-CM | POA: Diagnosis not present

## 2015-07-02 DIAGNOSIS — Z452 Encounter for adjustment and management of vascular access device: Secondary | ICD-10-CM | POA: Diagnosis not present

## 2015-07-02 DIAGNOSIS — J853 Abscess of mediastinum: Secondary | ICD-10-CM | POA: Diagnosis not present

## 2015-07-02 DIAGNOSIS — T84020D Dislocation of internal right hip prosthesis, subsequent encounter: Secondary | ICD-10-CM | POA: Diagnosis not present

## 2015-07-02 MED FILL — Lactated Ringer's Solution: INTRAVENOUS | Qty: 1000 | Status: AC

## 2015-07-02 MED FILL — Propofol IV Emul 200 MG/20ML (10 MG/ML): INTRAVENOUS | Qty: 20 | Status: AC

## 2015-07-02 MED FILL — Midazolam HCl Inj 2 MG/2ML (Base Equivalent): INTRAMUSCULAR | Qty: 4 | Status: AC

## 2015-07-02 MED FILL — Fentanyl Citrate Preservative Free (PF) Inj 100 MCG/2ML: INTRAMUSCULAR | Qty: 2 | Status: AC

## 2015-07-03 NOTE — Discharge Summary (Signed)
Patient ID: [name] MRN: [id number] DOB/AGE: Jun 25, 1946 69 y.o.  Admit date: 06/20/2015 Discharge date: 07/03/2015  Admission Diagnoses:  Active Problems:   Infected wound, initial encounter   Discharge Diagnoses:  Active Problems:   Infected wound, initial encounter  status post Procedure(s): REPEAT IRRIGATION AND DEBRIDEMENT OF CERVICAL SPINE APPLICATION OF INCISIONAL WOUND VAC  Past Medical History  Diagnosis Date  . Myocardial infarction (HCC) 2000  . Diabetes mellitus type 2, insulin dependent (HCC)   . COPD (chronic obstructive pulmonary disease) (HCC)   . Hyperlipidemia   . OA (osteoarthritis) of knee   . Hypertension   . Coronary artery disease   . Asthma   . Elevated LFTs   . ETOH abuse   . Tobacco abuse   . Osteoarthritis   . Breast cancer (HCC)   . H/O atrial fibrillation without current medication     only one time when she had sepsis    Surgeries: Procedure(s): REPEAT IRRIGATION AND DEBRIDEMENT OF CERVICAL SPINE APPLICATION OF INCISIONAL WOUND VAC on 06/20/2015   Consultants:    Discharged Condition: Improved  Hospital Course: [name] is an 69 y.o. female who was admitted 06/20/2015 for operative treatment of infected neck wound. Patient failed conservative treatments (please see the history and physical for the specifics) and had severe unremitting pain that affects sleep, daily activities and work/hobbies. After pre-op clearance, the patient was taken to the operating room on 06/20/2015 and underwent  Procedure(s): REPEAT IRRIGATION AND DEBRIDEMENT OF CERVICAL SPINE APPLICATION OF INCISIONAL WOUND VAC.   The pts hospital course was uneventful.  The pt was discharged on 06/21/15.  Patient was given perioperative antibiotics:  Anti-infectives    Start     Dose/Rate Route Frequency Ordered Stop   06/21/15 1200  meropenem (MERREM) 1 g in sodium chloride 0.9 % 100 mL IVPB  Status:  Discontinued     1 g 200 mL/hr over 30 Minutes  Intravenous Every 8 hours 06/21/15 1115 06/21/15 1755   06/21/15 0300  vancomycin (VANCOCIN) 500 mg in sodium chloride 0.9 % 100 mL IVPB  Status:  Discontinued     500 mg 100 mL/hr over 60 Minutes Intravenous Every 12 hours 06/20/15 1909 06/21/15 1755   06/20/15 1430  vancomycin (VANCOCIN) IVPB 1000 mg/200 mL premix     1,000 mg 200 mL/hr over 60 Minutes Intravenous To [facility] 06/19/15 1231 06/20/15 1613       Patient was given sequential compression devices and early ambulation to prevent DVT.   Patient benefited maximally from hospital stay and there were no complications. At the time of discharge, the patient was urinating/moving their bowels without difficulty, tolerating a regular diet, pain is controlled with oral pain medications and they have been cleared by PT/OT.   Recent vital signs: No data found.    Recent laboratory studies: No results for input(s): WBC, HGB, HCT, PLT, NA, K, CL, CO2, BUN, CREATININE, GLUCOSE, INR, CALCIUM in the last 72 hours.  Invalid input(s): PT, 2   Discharge Medications:     Medication List    TAKE these medications        albuterol 108 (90 BASE) MCG/ACT inhaler  Commonly known as:  PROVENTIL HFA;VENTOLIN HFA  Inhale 2 puffs into the lungs every 6 (six) hours as needed for wheezing.     ALPRAZolam 0.25 MG tablet  Commonly known as:  XANAX  Take one tablet daily or as needed     aspirin EC 81 MG tablet  Take 1 tablet (81 mg total) by mouth daily.     diclofenac 75 MG EC tablet  Commonly known as:  VOLTAREN  Take 75 mg by mouth daily as needed for mild pain.     docusate sodium 100 MG capsule  Commonly known as:  COLACE  Take 1 capsule (100 mg total) by mouth 2 (two) times daily.     feeding supplement (JEVITY 1.2 CAL) Liqd  Give 2 cans three times daily via PEG     fentaNYL 25 MCG/HR patch  Commonly known as:  DURAGESIC - dosed mcg/hr  Place 1 patch (25 mcg total) onto the skin every 3 (three) days.     free water  Soln  Place 60 mLs into feeding tube every 8 (eight) hours. Flush BEFORE AND AFTER EACH JEVITY ADMINISTRATION     furosemide 40 MG tablet  Commonly known as:  LASIX  Take 1 tablet (40 mg total) by mouth 2 (two) times daily.     gabapentin 600 MG tablet  Commonly known as:  NEURONTIN  Take 600 mg by mouth 4 (four) times daily.     JANUVIA 25 MG tablet  Generic drug:  sitaGLIPtin  Take 12.5 mg by mouth daily.     loperamide 2 MG capsule  Commonly known as:  IMODIUM  Take 2 mg by mouth 4 (four) times daily. With feedins  0600,1200,1700,2245     losartan 25 MG tablet  Commonly known as:  COZAAR  Take 25 mg by mouth daily.     meropenem 1 g in sodium chloride 0.9 % 100 mL  Inject 1 g into the vein every 8 (eight) hours. Last dose on 06/14/15     oxyCODONE 15 MG immediate release tablet  Commonly known as:  ROXICODONE  Take 15 mg by mouth every 6 (six) hours as needed for pain.     tiZANidine 4 MG tablet  Commonly known as:  ZANAFLEX  Take 4 mg by mouth every 8 (eight) hours as needed for muscle spasms.        Diagnostic Studies: Dg Chest 2 View  06/20/2015  CLINICAL DATA:  History of tobacco use.  Preop for spinal surgery. EXAM: CHEST  2 VIEW COMPARISON:  April 04, 2015. FINDINGS: Cardiomediastinal silhouette appears normal. No pneumothorax or pleural effusion is noted. Left-sided PICC line is noted with distal tip in expected position of SVC. Surgical clips are noted in right axillary region. No acute pulmonary disease is noted. Hyperexpansion of the lungs is noted suggesting chronic obstructive pulmonary disease. Bony thorax is unremarkable. IMPRESSION: Hyperexpansion of the lungs suggesting chronic obstructive pulmonary disease. No acute cardiopulmonary abnormality seen. Electronically Signed   By: Lupita Raider, M.D.   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 Cervical  esophageal perforation. EXAM: MRI CERVICAL SPINE WITHOUT AND WITH CONTRAST TECHNIQUE: Multiplanar and multiecho pulse sequences of the cervical spine, to include the craniocervical junction and cervicothoracic junction, were obtained according to standard protocol without and with intravenous contrast. CONTRAST:  10mL MULTIHANCE GADOBENATE DIMEGLUMINE 529 MG/ML IV SOLN COMPARISON:  MRI dated 04/30/2015 and 04/27/2015 and water-soluble contrast esophagram dated 06/21/2015 FINDINGS: Anterior hardware has been removed since the prior MRI. There is retrolisthesis of C3 on C4 with compression of the cervical spinal cord to a minimum AP dimension of 4.7 mm at the level of the C3-4 disc space. No myelopathy. This appears unchanged.  There is persistent enhancement of the disc space at C3-4. Prevertebral soft tissue swelling is substantially less prominent throughout the cervical and upper thoracic region although edema and abnormal enhancement of the prevertebral soft tissues persists. There is no definable abscess in the prevertebral soft tissues. There is slight enhancement of the anterior epidural space at C3-4, C4-5, and C5-6. This is unchanged. The graft appears in good position with no change in the appearance at the fused levels. The AP dimension of the spinal canal is narrowed at C4-5 but this appears unchanged. The edema and enhancement around the right facet joints at C3-4 and C4-5 has almost resolved. Title based epiglottis appear normal. There is slight edema in the C6 and C7 vertebra with minimal enhancement of the left-sided C7 common much diminished since the prior exam. There is also decreased enhancement of the C3 vertebra. IMPRESSION: 1. Marked improvement in the inflammatory changes in the prevertebral and posterior paraspinal soft tissues as well as decreased edema and enhancement in the C3, C6, and C7 vertebra. 2. Persistent 4 mm retrolisthesis of C3 on C4 with slight compression of the cervical spinal  cord without appreciable myelopathy. The AP dimension of the spinal canal at that level remains at 4.7 mm. 3. Decreased but residual anterior soft tissue swelling primarily at C3-4. This area enhances consistent with residual inflammation or scarring but there is no visible abscess. Electronically Signed   By: Francene Boyers M.D.   On: 06/28/2015 15:39   Mr Hip Right W Wo Contrast  06/28/2015  CLINICAL DATA:  Severe right hip pain. 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 EXAM: MRI OF THE RIGHT HIP WITHOUT AND WITH CONTRAST TECHNIQUE: Multiplanar, multisequence MR imaging was performed both before and after administration of intravenous contrast. CONTRAST:  10 cc MultiHance COMPARISON:  Radiographs dated 04/26/2015 FINDINGS: Bones: There is no fracture. Bilateral total hip prosthesis are in place. No bone edema. The visualized portions of the sacroiliac joints are normal. There are no appreciable hip joint effusions. Artifact from the prostheses limits sensitivity, however. Muscles and tendons Muscles and tendons: There is slight edema in the right gluteus maximus muscle which I suspect is due to a strain or contusion due to the dislocation. The muscles are bilaterally symmetrical and otherwise normal. Other findings Miscellaneous: No mass lesion. No adenopathy. No evidence of infection in or around the hips or pelvis. IMPRESSION: No significant abnormality of the hips or pelvis. Slight muscle strain or contusion in the right gluteus medius felt to be due to the prior dislocation. Electronically Signed   By: Francene Boyers M.D.   On: 06/28/2015 16:04   Dg Esophagus W/water Sol Cm  06/21/2015  CLINICAL DATA:  Esophageal perforation. EXAM: ESOPHOGRAM/BARIUM SWALLOW TECHNIQUE: Single contrast examination was performed using water-soluble contrast. FLUOROSCOPY TIME:  Fluoroscopy Time:  1 minutes 0 seconds COMPARISON:  Esophagram dated 04/27/2015 and CT scan dated 04/26/2015 FINDINGS: The patient  ingested water-soluble contrast in the AP, oblique, and lateral projections. 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 There is a small focal area of the diverticulum which appears to be adherent to the prevertebral soft tissues best seen on image 6 of series 3. There is no evidence of extravasation of contrast. There is no aspiration. There is no fistula. The distal esophagus appears normal. IMPRESSION: No evidence of a persistent esophageal perforation or fistula to the prevertebral soft tissues. 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 Electronically Signed   By: Francene Boyers M.D.   On: 06/21/2015 09:22          Follow-up Information    Follow up with [facility].   Why:  Someone from [facility] will contact you concerning start date and time for therapy.   Contact information:   [address] [place] [address] [phone]       Discharge Plan:  discharge to home Continue to f/u with Infectious Disease F/u with Dr. Shon Baton in office in 1 week to remove the incisional vac  Disposition:     Signed: MayoBaxter Kail for Dr. Venita Lick [facility] [phone] 07/03/2015, 1:19 PM

## 2015-07-04 ENCOUNTER — Ambulatory Visit (INDEPENDENT_AMBULATORY_CARE_PROVIDER_SITE_OTHER): Payer: Medicare Other | Admitting: Infectious Disease

## 2015-07-04 ENCOUNTER — Encounter: Payer: Self-pay | Admitting: Infectious Disease

## 2015-07-04 ENCOUNTER — Telehealth: Payer: Self-pay | Admitting: *Deleted

## 2015-07-04 ENCOUNTER — Other Ambulatory Visit: Payer: Self-pay | Admitting: Pharmacist

## 2015-07-04 VITALS — BP 142/86 | HR 91 | Temp 98.1°F | Wt 125.0 lb

## 2015-07-04 DIAGNOSIS — Z96649 Presence of unspecified artificial hip joint: Secondary | ICD-10-CM

## 2015-07-04 DIAGNOSIS — Z4789 Encounter for other orthopedic aftercare: Secondary | ICD-10-CM | POA: Diagnosis not present

## 2015-07-04 DIAGNOSIS — T84029D Dislocation of unspecified internal joint prosthesis, subsequent encounter: Secondary | ICD-10-CM | POA: Diagnosis not present

## 2015-07-04 DIAGNOSIS — J853 Abscess of mediastinum: Secondary | ICD-10-CM | POA: Diagnosis not present

## 2015-07-04 DIAGNOSIS — K223 Perforation of esophagus: Secondary | ICD-10-CM | POA: Diagnosis not present

## 2015-07-04 DIAGNOSIS — I2510 Atherosclerotic heart disease of native coronary artery without angina pectoris: Secondary | ICD-10-CM | POA: Diagnosis not present

## 2015-07-04 DIAGNOSIS — T798XXA Other early complications of trauma, initial encounter: Secondary | ICD-10-CM | POA: Diagnosis not present

## 2015-07-04 DIAGNOSIS — M4642 Discitis, unspecified, cervical region: Secondary | ICD-10-CM | POA: Diagnosis not present

## 2015-07-04 DIAGNOSIS — L0291 Cutaneous abscess, unspecified: Secondary | ICD-10-CM | POA: Diagnosis not present

## 2015-07-04 DIAGNOSIS — M4622 Osteomyelitis of vertebra, cervical region: Secondary | ICD-10-CM | POA: Diagnosis not present

## 2015-07-04 DIAGNOSIS — Z23 Encounter for immunization: Secondary | ICD-10-CM

## 2015-07-04 MED ORDER — LEVOFLOXACIN 25 MG/ML PO SOLN: 500.0000 mg | Freq: Every day | ORAL | Status: DC

## 2015-07-04 NOTE — Patient Instructions (Signed)
We will continue the IV abx thru Friday and plan on pull PICC  I have called in a  Liquid form of Levofloxacin for now which you should take until we find out if Dr. Erik Obey will let you take pills

## 2015-07-04 NOTE — Progress Notes (Signed)
Chief complaint: followup for esophageal perforation, cervical diskitis   Subjective:    Patient ID: [name], female    DOB: November 20, 1945, 69 y.o.   MRN: [id number]  HPI   69 y.o. 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 Initially surgery was not going to be performed and we were consulted and placed the patient on broad spectrum vancomycin and meropenem. However ultimately surgery WAS pursued and I stopped her abx 48 hours prior to  joint Surgery b Dr. Shon Baton and Dr. Lazarus Salines on August 18th, 2016with : Direct Laryngoscopy , Esophagoscopy 1. Removal of anterior cervical plate. 2. Exploration of spinal fusion. 3. Incision and drainage of cervical abscess with placement of drains.  Cultures were taken and grew mx organisms but no staph or strep and no predominant species. Dr. Orvan Falconer narrowed the pt to meropenem. Pt had feeding tube placed and is NPO and  Finished more than  8 weeks of postoperative abx   In the interim she had developed an area on her neck where she has had drainage and which PA from [facility] termed a cyst. She was seen Dr. Lazarus Salines for evaluation of this area but he was not convinced it was infected or required further surgery, then seen by Dr Shon Baton who admitted the patient and performed  Incision and drainage of right wound with application of incisional wound VAC on 06/20/15. I do not see that cutures were obtained. This area was superficial and did not extend deeply.  We obtained MRI of her hip which failed to show any infectious pathology and of  Her C spine  On 06/28/15 which showed:  IMPRESSION: 1. Marked improvement in the inflammatory changes in the prevertebral and posterior paraspinal soft tissues as well as decreased edema and enhancement in the C3, C6, and C7 vertebra. 2. Persistent 4 mm retrolisthesis of  C3 on C4 with slight compression of the cervical spinal cord without appreciable myelopathy. The AP dimension of the spinal canal at that level remains at 4.7 mm. 3. Decreased but residual anterior soft tissue swelling primarily at C3-4. This area enhances consistent with residual inflammation or scarring but there is no visible abscess.  She remains on carbapenem but states she was taken off vancomycin at [place] from her first stay and has not been on it except while in the hospital.  She is anxious to come off abx. I have seen her labs from [facility] including ESR and CRP (both normal now)   I feel comfortable changing her to an oral abx in liquid form (pill form if her surgeons will allow that at this point)   Past Medical History  Diagnosis Date  . Myocardial infarction (HCC) 2000  . Diabetes mellitus type 2, insulin dependent (HCC)   . COPD (chronic obstructive pulmonary disease) (HCC)   . Hyperlipidemia   . OA (osteoarthritis) of knee   . Hypertension   . Coronary artery disease   . Asthma   . Elevated LFTs   . ETOH abuse   . Tobacco abuse   . Osteoarthritis   . Breast cancer (HCC)   . H/O atrial fibrillation without current medication     only one time when she had sepsis   Past Surgical History  Procedure Laterality Date  . Breast surgery  1991    right mastectomy  . Vulvectomy  1981    partial  . Neck  fusion  2011  . Pelvic laparoscopy  2002    RSO  . Cesarean section  '78, '80, '81    x 3  . Hysteroscopy      D & C  . Cardiac catheterization  04/05/2009    EF 60%  . Lobectomy    . Tonsillectomy and adenoidectomy    . Mastectomy    . Coronary angioplasty  08/1998    x2 OF A BIFURCATION OM-1, OM-2 LESION  . Coronary angioplasty with stent placement  01/1999    MID FIRST OBTUSE MARGINAL VESSEL  . Coronary angioplasty with stent placement  07/1999    STENTING AT THE CRUX OF THE RIGHT CORONARY ARTERY WITH A 3.8MM X TETRA STENT  . Cardiovascular stress test   01/31/2005    EF 58%  . Total hip arthroplasty  08/2010    bilat  . Hip closed reduction Right 04/26/2015    Procedure: CLOSED REDUCTION HIP;  Surgeon: Venita Lick, MD;  [facility];  Service: Orthopedics;  Laterality: Right;  . Incision and drainage abscess N/A 05/03/2015    Procedure: INCISION AND DRAINAGE CERVICAL  ABSCESS AND REMOVAL OF HARDWARE;  Surgeon: Venita Lick, MD;  [facility];  Service: Orthopedics;  Laterality: N/A;  . Hardware removal N/A 05/03/2015    Procedure: HARDWARE REMOVAL;  Surgeon: Venita Lick, MD;  [facility]: [facility];  Service: Orthopedics;  Laterality: N/A;  . Rigid esophagoscopy N/A 05/03/2015    Procedure: RIGID ESOPHAGOSCOPY;  Surgeon: Flo Shanks, MD;  Location: [facility] OR;  Service: ENT;  Laterality: N/A;  . Direct laryngoscopy N/A 05/03/2015    Procedure: DIRECT LARYNGOSCOPY;  Surgeon: Flo Shanks, MD;  Location: [facility] OR;  Service: ENT;  Laterality: N/A;  . Gastrostomy N/A 05/04/2015    Procedure: OPEN GASTROSTOMY WITH TUBE PLACEMENT;  Surgeon: Manus Rudd, MD;  Location: [facility];  Service: General;  Laterality: N/A;  . Application of wound vac N/A 06/20/2015    Procedure: APPLICATION OF INCISIONAL WOUND VAC;  Surgeon: Venita Lick, MD;  [facility];  Service: Orthopedics;  Laterality: N/A;  . Radiology with anesthesia Right 06/28/2015    Procedure: MRI OF CERVICAL SPINE  AND RIGHT HIP  WITH AND WITHOUT CONTRAST    (RADIOLOGY WITH ANESTHESIA);  Surgeon: Medication Radiologist, MD;  Location: [facility];  Service: Radiology;  Laterality: Right;   Family History  Problem Relation Age of Onset  . Diabetes Mother   . Hypertension Father   . Heart disease Father   . Heart attack Father   . Stroke Father    Social History  Substance Use Topics  . Smoking status: Current Every Day Smoker -- 0.75 packs/day for 50 years    Types: Cigarettes  . Smokeless tobacco: Never Used  . Alcohol Use: No   Allergies  Allergen Reactions  . Penicillins Rash  .  Zithromax [Azithromycin Dihydrate] Other (See Comments)    ORAL ULCERS      Current outpatient prescriptions:  .  albuterol (PROVENTIL HFA;VENTOLIN HFA) 108 (90 BASE) MCG/ACT inhaler, Inhale 2 puffs into the lungs every 6 (six) hours as needed for wheezing. , Disp: , Rfl:  .  ALPRAZolam (XANAX) 0.25 MG tablet, Take one tablet daily or as needed (Patient taking differently: Take 0.25 mg by mouth daily as needed for anxiety. ), Disp: 90 tablet, Rfl: 3 .  aspirin EC 81 MG tablet, Take 1 tablet (81 mg total) by mouth daily., Disp: 30 tablet, Rfl: 0 .  docusate sodium (COLACE)  100 MG capsule, Take 1 capsule (100 mg total) by mouth 2 (two) times daily., Disp: 10 capsule, Rfl: 0 .  fentaNYL (DURAGESIC - DOSED MCG/HR) 25 MCG/HR patch, Place 1 patch (25 mcg total) onto the skin every 3 (three) days., Disp: 5 patch, Rfl: 0 .  furosemide (LASIX) 40 MG tablet, Take 1 tablet (40 mg total) by mouth 2 (two) times daily. (Patient taking differently: Take 40 mg by mouth daily. ), Disp: 180 tablet, Rfl: 3 .  gabapentin (NEURONTIN) 600 MG tablet, Take 600 mg by mouth 4 (four) times daily. , Disp: , Rfl:  .  JANUVIA 25 MG tablet, Take 12.5 mg by mouth daily. , Disp: , Rfl:  .  loperamide (IMODIUM) 2 MG capsule, Take 2 mg by mouth 4 (four) times daily. With feedins  0600,1200,1700,2245, Disp: , Rfl:  .  losartan (COZAAR) 25 MG tablet, Take 25 mg by mouth daily. , Disp: , Rfl: 1 .  meropenem 1 g in sodium chloride 0.9 % 100 mL, Inject 1 g into the vein every 8 (eight) hours. Last dose on 06/14/15, Disp: 120 vial, Rfl: 0 .  Nutritional Supplements (FEEDING SUPPLEMENT, JEVITY 1.2 CAL,) LIQD, Give 2 cans three times daily via PEG (Patient taking differently: Take 237 mLs by mouth See admin instructions. Give 2 cans three times daily via PEG), Disp: 237 mL, Rfl: 99 .  oxyCODONE (ROXICODONE) 15 MG immediate release tablet, Take 15 mg by mouth every 6 (six) hours as needed for pain. , Disp: , Rfl:  .  tiZANidine (ZANAFLEX)  4 MG tablet, Take 4 mg by mouth every 8 (eight) hours as needed for muscle spasms. , Disp: , Rfl: 0 .  Water For Irrigation, Sterile (FREE WATER) SOLN, Place 60 mLs into feeding tube every 8 (eight) hours. Flush BEFORE AND AFTER EACH JEVITY ADMINISTRATION, Disp: 1000 mL, Rfl: 99 .  levofloxacin (LEVAQUIN) 25 MG/ML solution, Take 20 mLs (500 mg total) by mouth daily., Disp: 680 mL, Rfl: 2     Review of Systems  Constitutional: Positive for fatigue and unexpected weight change. Negative for fever, chills, diaphoresis, activity change and appetite change.  HENT: Negative for congestion, rhinorrhea, sinus pressure, sneezing, sore throat and trouble swallowing.   Eyes: Negative for photophobia and visual disturbance.  Respiratory: Negative for cough, chest tightness, shortness of breath, wheezing and stridor.   Cardiovascular: Negative for chest pain, palpitations and leg swelling.  Gastrointestinal: Negative for nausea, vomiting, abdominal pain, diarrhea, constipation, blood in stool, abdominal distention and anal bleeding.  Genitourinary: Negative for dysuria, hematuria, flank pain and difficulty urinating.  Musculoskeletal: Negative for myalgias, back pain, joint swelling, arthralgias and gait problem.  Skin: Positive for wound. Negative for color change, pallor and rash.  Neurological: Negative for dizziness, tremors, weakness and light-headedness.  Hematological: Negative for adenopathy. Does not bruise/bleed easily.  Psychiatric/Behavioral: Negative for behavioral problems, confusion, sleep disturbance, dysphoric mood, decreased concentration and agitation.       Objective:   Physical Exam  Constitutional: She is oriented to person, place, and time. No distress.  HENT:  Head: Normocephalic and atraumatic.  Mouth/Throat: No oropharyngeal exudate.  Eyes: Conjunctivae and EOM are normal. No scleral icterus.  Neck: Normal range of motion. Neck supple.    Cardiovascular: Normal rate,  regular rhythm and normal heart sounds.  Exam reveals no gallop.   No murmur heard. Pulmonary/Chest: Effort normal and breath sounds normal. No respiratory distress. She has no wheezes.  Abdominal: Soft. Bowel sounds are normal. She exhibits  no distension.  Musculoskeletal: She exhibits no edema or tenderness.  Neurological: She is alert and oriented to person, place, and time. She exhibits normal muscle tone. Coordination normal.  Skin: Skin is warm and dry. No rash noted. She is not diaphoretic. No erythema. No pallor.  Psychiatric: She has a normal mood and affect. Her behavior is normal. Judgment and thought content normal.        Assessment & Plan:   Cervical diskitis, epidural abscess, mediastinal abscess due to esophageal perforation. Repeat MRI ESR, CRP and clinical picture all encouraging.  I will rx for liquid levaquin 500mg  daily until she is cleared for pills. SHe also may be able to take pills or liquid per feeding tube.  I had wanted to also add flagyl for anerobic coverage but in liquid form the dispensing of this drug is cumbersome  Would like to find out if she can be on pills one way or the other and this would simplify this  In the interim she is to  Continue on IV carbapenem through Friday at which time if she has liquid levaquin or can take oral levaquin and flagyl then these can be used orally or per PEG instead  Hip dislocations recurrent: worry this site could be infected as well and will image with MRI  Wound infection: sp surgery and resolving  I spent greater than 40  minutes with the patient including greater than 50% of time in face to face counsel of the patient and her husband re her cervical disktis, epidural abscess, esophageal perforation, wound infection, hip dislocations  and in coordination of their care.  Acey Lav, MD

## 2015-07-04 NOTE — Telephone Encounter (Signed)
Thanks Jackie 

## 2015-07-04 NOTE — Telephone Encounter (Signed)
Verbal order per Dr. Tommy Medal given to [facility] at [facility] to pull patient's picc line on Friday 07/06/15. Myrtis Hopping

## 2015-07-06 ENCOUNTER — Other Ambulatory Visit: Payer: Self-pay | Admitting: *Deleted

## 2015-07-06 ENCOUNTER — Telehealth: Payer: Self-pay | Admitting: *Deleted

## 2015-07-06 DIAGNOSIS — M4622 Osteomyelitis of vertebra, cervical region: Secondary | ICD-10-CM

## 2015-07-06 MED ORDER — METRONIDAZOLE 500 MG PO TABS: 500.0000 mg | ORAL_TABLET | Freq: Three times a day (TID) | ORAL | Status: DC

## 2015-07-06 MED ORDER — LEVOFLOXACIN 500 MG PO TABS: 500.0000 mg | ORAL_TABLET | Freq: Every day | ORAL | Status: DC

## 2015-07-06 NOTE — Telephone Encounter (Signed)
Pharmacy called to get patient liquid levofloxacin changed to tablets at the patient request. Per Dr Tommy Medal last note on the issue, 07/04/15, he advised Dr Erik Obey will make that determination. Pharmacist advised the patient says she feels fine and wants the medication changed. Advised him will send the doctor a message and find out if it is ok and get the conversion and have someone call the pharmacy back asap.

## 2015-07-06 NOTE — Telephone Encounter (Signed)
Verbal order taken from Dr. Tommy Medal for Levaquin 500 mg take one tablet daily #30 refill 1 and Flagyl 500 mg take one tablet three times daily #90 refill 1. Sent rx to pharmacy, notified patient.

## 2015-07-06 NOTE — Telephone Encounter (Signed)
Per ENT, ok for patient to take pills.

## 2015-07-06 NOTE — Telephone Encounter (Signed)
Need dosing and directions, please.  Also #pills dispensed and refills.

## 2015-07-06 NOTE — Telephone Encounter (Signed)
Yes we need clearance from her ENT MD

## 2015-07-13 ENCOUNTER — Telehealth: Payer: Self-pay | Admitting: *Deleted

## 2015-07-13 NOTE — Telephone Encounter (Signed)
Patient called and advised she is having very bad diarrhea for the past 4 days. She advised she has not been able to leave the house or eat. She is taking pepto and Imodium and they are not working. She feels bad and can not stay out of the restroom. She is taking Cozaar and Levaquin and thinks it is the levaquin. She is upset that she had to miss an appt with her ortho about her knee replacement due to sever diarrhea today.  Advised she wants to know what she should do and will wait for our call back.

## 2015-07-13 NOTE — Telephone Encounter (Signed)
She should have her stool checked for Clostridium difficile. Perhaps [facility] can test her at home. If it is because of the levaqquin it is because she now has C diff.  Is she allergic to sulfa?

## 2015-07-18 DIAGNOSIS — Z471 Aftercare following joint replacement surgery: Secondary | ICD-10-CM | POA: Diagnosis not present

## 2015-07-18 DIAGNOSIS — Z96641 Presence of right artificial hip joint: Secondary | ICD-10-CM | POA: Diagnosis not present

## 2015-07-18 DIAGNOSIS — M25551 Pain in right hip: Secondary | ICD-10-CM | POA: Diagnosis not present

## 2015-07-19 ENCOUNTER — Telehealth: Payer: Self-pay | Admitting: *Deleted

## 2015-07-19 NOTE — Telephone Encounter (Signed)
Per Dr Tommy Medal note called the patient to check if she is still having diarrhea and she was not available. Advised to have her call the office if she is still having diarrhea so we can test for C Diff.

## 2015-07-20 NOTE — Telephone Encounter (Signed)
As we discussed ok to continue levaquin for now and if diarrhea better off flagyl ok not to test for CDI

## 2015-07-20 NOTE — Telephone Encounter (Addendum)
Patient's husband picked up the C Diff stool kit today.  RN advised him how to collect and store the sample, his questions were answered to his satisfaction. RN paged Dr. Tommy Medal for clear directions for the patient. Per Dr. Tommy Medal, patient should stop the metronidazole but continue the levaquin.  If her diarrhea is resolving, there is no need to bring a stool sample.  If her diarrhea is still present on Monday, patient should bring in the sample.  If her diarrhea worsens or if she is unable to hold down liquids or food, patient should go to the ED for evaluation.  Patient verbalized understanding, agreement.  She wanted to know if she should adjust her lasix due to her diarrhea - RN advised her to call that prescriber for advice.  She will do so. Landis Gandy, RN

## 2015-07-23 ENCOUNTER — Other Ambulatory Visit: Payer: Medicare Other

## 2015-07-23 DIAGNOSIS — R197 Diarrhea, unspecified: Secondary | ICD-10-CM

## 2015-07-24 ENCOUNTER — Ambulatory Visit (INDEPENDENT_AMBULATORY_CARE_PROVIDER_SITE_OTHER): Payer: Medicare Other | Admitting: Cardiology

## 2015-07-24 ENCOUNTER — Other Ambulatory Visit: Payer: Medicare Other

## 2015-07-24 ENCOUNTER — Encounter: Payer: Self-pay | Admitting: Cardiology

## 2015-07-24 VITALS — BP 120/68 | HR 96 | Ht 63.0 in | Wt 128.6 lb

## 2015-07-24 DIAGNOSIS — F17200 Nicotine dependence, unspecified, uncomplicated: Secondary | ICD-10-CM

## 2015-07-24 DIAGNOSIS — I2510 Atherosclerotic heart disease of native coronary artery without angina pectoris: Secondary | ICD-10-CM

## 2015-07-24 DIAGNOSIS — I4891 Unspecified atrial fibrillation: Secondary | ICD-10-CM | POA: Diagnosis not present

## 2015-07-24 DIAGNOSIS — J449 Chronic obstructive pulmonary disease, unspecified: Secondary | ICD-10-CM

## 2015-07-24 LAB — CLOSTRIDIUM DIFFICILE BY PCR: CDIFFPCR: NOT DETECTED

## 2015-07-24 NOTE — Patient Instructions (Signed)
Continue your current therapy  I will see you in 6 months.   

## 2015-07-25 NOTE — Progress Notes (Signed)
07/25/2015   PCP: Anthoney Harada, MD   Chief Complaint  Patient presents with  . Follow-up    no chest pain, has shortness of breath, no edema, has pain in legs, has cramping in legs,has lightheadedness, has dizziness    Primary Cardiologist:Dr. P. Martinique    HPI:  [name] is seen for follow up CAD. She has a history of coronary disease and has had multiple angioplasty procedures of the first obtuse marginal vessel. She is status post stenting of the right coronary using a 3.0 x 18 mm Tetra stent in 2000. Her last cardiac catheterization in July of 2010 showed nonobstructive disease. Her myoview study in 07/2014 was normal. She has a history of tobacco and Etoh abuse. She has chronic depression. Her last Echo 08/2014 with EF 60-65% moderate MR, PA peak pressure 39 mm Hg overall poor quality.  She was hospitalized  in Dec. 2015 for sepsis due to PNA and UTI.  During this acute episode she had rapid atrial fib and mild elevation of troponin at 1.20.  Prior to discharge she was in SR, it was felt no anticoagulation was warranted for brief episode of PAF with acute illness.  Her troponin elevation was felt to be due to demand ischemia.   She has had a very difficult time since September. She dislocated her hip. She was having progressive swallowing problems. She was diagnosed with cervical discitis with spinal canal stenosis. She was found to have an epidural abscess extending into the mediastinum and had an esophageal perforation. This required surgical exploration with removal of hardware. She has been on 6 weeks of IV antibiotics. She had a feeding gastrostomy placed and was NPO. The gastrostomy has been removed. She has lost 10 lbs. Appetite is poor. Overall she is feeling better. States she will still need hip surgery eventually. With all this she has had no cardiac complaints or problems. She is still smoking.    Allergies  Allergen Reactions  . Penicillins Rash  .  Zithromax [Azithromycin Dihydrate] Other (See Comments)    ORAL ULCERS     Current Outpatient Prescriptions  Medication Sig Dispense Refill  . albuterol (PROVENTIL HFA;VENTOLIN HFA) 108 (90 BASE) MCG/ACT inhaler Inhale 2 puffs into the lungs every 6 (six) hours as needed for wheezing.     Marland Kitchen ALPRAZolam (XANAX) 0.25 MG tablet Take one tablet daily or as needed (Patient taking differently: Take 0.25 mg by mouth daily as needed for anxiety. ) 90 tablet 3  . aspirin EC 81 MG tablet Take 1 tablet (81 mg total) by mouth daily. 30 tablet 0  . furosemide (LASIX) 40 MG tablet Take 1 tablet (40 mg total) by mouth 2 (two) times daily. (Patient taking differently: Take 40 mg by mouth daily. ) 180 tablet 3  . gabapentin (NEURONTIN) 600 MG tablet Take 600 mg by mouth 4 (four) times daily.     Marland Kitchen JANUVIA 25 MG tablet Take 12.5 mg by mouth daily.     Marland Kitchen levofloxacin (LEVAQUIN) 500 MG tablet Take 1 tablet (500 mg total) by mouth daily. 30 tablet 1  . loperamide (IMODIUM) 2 MG capsule Take 2 mg by mouth 4 (four) times daily. With feedins  3299,2426,8341,9622    . losartan (COZAAR) 25 MG tablet Take 25 mg by mouth daily.   1  . Nutritional Supplements (FEEDING SUPPLEMENT, JEVITY 1.2 CAL,) LIQD Give 2 cans three times daily via PEG (Patient taking differently: Take 237 mLs by  mouth See admin instructions. Give 2 cans three times daily via PEG) 237 mL 99  . oxyCODONE (ROXICODONE) 15 MG immediate release tablet Take 15 mg by mouth every 6 (six) hours as needed for pain.     Marland Kitchen tiZANidine (ZANAFLEX) 4 MG tablet Take 4 mg by mouth every 8 (eight) hours as needed for muscle spasms.   0   No current facility-administered medications for this visit.    Past Medical History  Diagnosis Date  . Myocardial infarction ([place]) 2000  . Diabetes mellitus type 2, insulin dependent ([place])   . COPD (chronic obstructive pulmonary disease) ([place])   . Hyperlipidemia   . OA (osteoarthritis) of knee   . Hypertension   . Coronary artery  disease   . Asthma   . Elevated LFTs   . ETOH abuse   . Tobacco abuse   . Osteoarthritis   . Breast cancer ([place])   . H/O atrial fibrillation without current medication     only one time when she had sepsis    Past Surgical History  Procedure Laterality Date  . Breast surgery  1991    right mastectomy  . Vulvectomy  1981    partial  . Neck fusion  2011  . Pelvic laparoscopy  2002    RSO  . Cesarean section  '78, '80, '81    x 3  . Hysteroscopy      D & C  . Cardiac catheterization  04/05/2009    EF 60%  . Lobectomy    . Tonsillectomy and adenoidectomy    . Mastectomy    . Coronary angioplasty  08/1998    x2 OF A BIFURCATION OM-1, OM-2 LESION  . Coronary angioplasty with stent placement  01/1999    MID FIRST OBTUSE MARGINAL VESSEL  . Coronary angioplasty with stent placement  07/1999    STENTING AT THE CRUX OF THE RIGHT CORONARY ARTERY WITH A 3.8MM X 18MM TETRA STENT  . Cardiovascular stress test  01/31/2005    EF 58%  . Total hip arthroplasty  08/2010    bilat  . Hip closed reduction Right 04/26/2015    Procedure: CLOSED REDUCTION HIP;  Surgeon: Melina Schools, MD;  [facility];  Service: Orthopedics;  Laterality: Right;  . Incision and drainage abscess N/A 05/03/2015    Procedure: INCISION AND DRAINAGE CERVICAL  ABSCESS AND REMOVAL OF HARDWARE;  Surgeon: Melina Schools, MD;  Location: [place];  Service: Orthopedics;  Laterality: N/A;  . Hardware removal N/A 05/03/2015    Procedure: HARDWARE REMOVAL;  Surgeon: Melina Schools, MD;  Location: [place];  Service: Orthopedics;  Laterality: N/A;  . Rigid esophagoscopy N/A 05/03/2015    Procedure: RIGID ESOPHAGOSCOPY;  Surgeon: Jodi Marble, MD;  Location: [place];  Service: ENT;  Laterality: N/A;  . Direct laryngoscopy N/A 05/03/2015    Procedure: DIRECT LARYNGOSCOPY;  Surgeon: Jodi Marble, MD;  Location: [place];  Service: ENT;  Laterality: N/A;  . Gastrostomy N/A 05/04/2015    Procedure: OPEN GASTROSTOMY WITH TUBE PLACEMENT;   Surgeon: Donnie Mesa, MD;  Location: [place];  Service: General;  Laterality: N/A;  . Application of wound vac N/A 06/20/2015    Procedure: APPLICATION OF INCISIONAL WOUND VAC;  Surgeon: Melina Schools, MD;  Location: [place];  Service: Orthopedics;  Laterality: N/A;  . Radiology with anesthesia Right 06/28/2015    Procedure: MRI OF CERVICAL SPINE  AND RIGHT HIP  WITH AND WITHOUT CONTRAST    (RADIOLOGY WITH ANESTHESIA);  Surgeon:  Medication Radiologist, MD;  Location: [place];  Service: Radiology;  Laterality: Right;    ROS:As noted in HPI. All other systems are reviewed and are negative.   Wt Readings from Last 3 Encounters:  07/24/15 58.316 kg (128 lb 9 oz)  07/04/15 56.7 kg (125 lb)  06/28/15 53.978 kg (119 lb)    PHYSICAL EXAM BP 120/68 mmHg  Pulse 96  Ht 5\' 3"  (1.6 m)  Wt 58.316 kg (128 lb 9 oz)  BMI 22.78 kg/m2 General:Pleasant affect, NAD Skin:Warm and dry, brisk capillary refill HEENT:normocephalic, sclera clear, mucus membranes moist Neck:supple, no JVD, no bruits  Heart:S1S2 RRR without murmur, gallup, rub or click Lungs:scattered rhonchi VZS:MOLM, non tender, + BS, do not palpate liver spleen or masses. Dressing over gastrostomy site.  Ext:no lower ext edema   2+ radial pulses  Neuro:alert and oriented X 3, MAE, follows commands, + facial symmetry   ASSESSMENT AND PLAN 1.  CAD- normal myoview in October 2015. No active angina. Continue medical therapy  2. HTN well controlled  3. Paroxysmal Afib- only noted during episode of sepsis. Will follow for recurrence. Would not commit to long term anticoagulation at this time.  4. Tobacco abuse. Encourage smoking cessation.  5. Etoh abuse. Encourage continued abstinence.   7. COPD  9. Recent complication with cervical discitis, epidural abscess, and esophageal perforation. She seems to be making a good recovery. I have recommended she postpone any hip surgery until she has fully recovered from her recent problems.

## 2015-07-26 ENCOUNTER — Telehealth: Payer: Self-pay | Admitting: *Deleted

## 2015-07-26 DIAGNOSIS — F17200 Nicotine dependence, unspecified, uncomplicated: Secondary | ICD-10-CM | POA: Diagnosis not present

## 2015-07-26 DIAGNOSIS — I10 Essential (primary) hypertension: Secondary | ICD-10-CM | POA: Diagnosis not present

## 2015-07-26 DIAGNOSIS — Z23 Encounter for immunization: Secondary | ICD-10-CM | POA: Diagnosis not present

## 2015-07-26 DIAGNOSIS — J449 Chronic obstructive pulmonary disease, unspecified: Secondary | ICD-10-CM | POA: Diagnosis not present

## 2015-07-26 DIAGNOSIS — E139 Other specified diabetes mellitus without complications: Secondary | ICD-10-CM | POA: Diagnosis not present

## 2015-07-26 DIAGNOSIS — I2510 Atherosclerotic heart disease of native coronary artery without angina pectoris: Secondary | ICD-10-CM | POA: Diagnosis not present

## 2015-07-26 DIAGNOSIS — M869 Osteomyelitis, unspecified: Secondary | ICD-10-CM | POA: Diagnosis not present

## 2015-07-26 DIAGNOSIS — E46 Unspecified protein-calorie malnutrition: Secondary | ICD-10-CM | POA: Diagnosis not present

## 2015-07-26 NOTE — Telephone Encounter (Signed)
Patient called for results of stool sample. Advised she was negative for c-diff. Myrtis Hopping

## 2015-08-01 ENCOUNTER — Encounter: Payer: Self-pay | Admitting: Infectious Disease

## 2015-08-02 ENCOUNTER — Encounter: Payer: Self-pay | Admitting: Infectious Disease

## 2015-08-03 DIAGNOSIS — Z0189 Encounter for other specified special examinations: Secondary | ICD-10-CM | POA: Diagnosis not present

## 2015-08-07 ENCOUNTER — Encounter: Payer: Self-pay | Admitting: Infectious Disease

## 2015-08-17 DIAGNOSIS — J449 Chronic obstructive pulmonary disease, unspecified: Secondary | ICD-10-CM | POA: Diagnosis not present

## 2015-08-17 DIAGNOSIS — I10 Essential (primary) hypertension: Secondary | ICD-10-CM | POA: Diagnosis not present

## 2015-08-17 DIAGNOSIS — E119 Type 2 diabetes mellitus without complications: Secondary | ICD-10-CM | POA: Diagnosis not present

## 2015-08-17 DIAGNOSIS — F17200 Nicotine dependence, unspecified, uncomplicated: Secondary | ICD-10-CM | POA: Diagnosis not present

## 2015-08-17 DIAGNOSIS — M869 Osteomyelitis, unspecified: Secondary | ICD-10-CM | POA: Diagnosis not present

## 2015-08-17 DIAGNOSIS — E46 Unspecified protein-calorie malnutrition: Secondary | ICD-10-CM | POA: Diagnosis not present

## 2015-08-17 DIAGNOSIS — R197 Diarrhea, unspecified: Secondary | ICD-10-CM | POA: Diagnosis not present

## 2015-08-17 DIAGNOSIS — K228 Other specified diseases of esophagus: Secondary | ICD-10-CM | POA: Diagnosis not present

## 2015-08-17 DIAGNOSIS — I2510 Atherosclerotic heart disease of native coronary artery without angina pectoris: Secondary | ICD-10-CM | POA: Diagnosis not present

## 2015-08-17 DIAGNOSIS — M40209 Unspecified kyphosis, site unspecified: Secondary | ICD-10-CM | POA: Diagnosis not present

## 2015-08-22 ENCOUNTER — Encounter: Payer: Self-pay | Admitting: Infectious Disease

## 2015-08-22 ENCOUNTER — Ambulatory Visit (INDEPENDENT_AMBULATORY_CARE_PROVIDER_SITE_OTHER): Payer: Medicare Other | Admitting: Infectious Disease

## 2015-08-22 VITALS — BP 156/91 | HR 65 | Temp 97.8°F | Ht 63.0 in | Wt 127.0 lb

## 2015-08-22 DIAGNOSIS — M4622 Osteomyelitis of vertebra, cervical region: Secondary | ICD-10-CM | POA: Diagnosis not present

## 2015-08-22 DIAGNOSIS — M4643 Discitis, unspecified, cervicothoracic region: Secondary | ICD-10-CM | POA: Diagnosis not present

## 2015-08-22 DIAGNOSIS — K223 Perforation of esophagus: Secondary | ICD-10-CM | POA: Diagnosis not present

## 2015-08-22 DIAGNOSIS — I2510 Atherosclerotic heart disease of native coronary artery without angina pectoris: Secondary | ICD-10-CM | POA: Diagnosis not present

## 2015-08-22 DIAGNOSIS — S73004D Unspecified dislocation of right hip, subsequent encounter: Secondary | ICD-10-CM

## 2015-08-22 LAB — CBC WITH DIFFERENTIAL/PLATELET
Basophils Absolute: 0.1 10*3/uL (ref 0.0–0.1)
Basophils Relative: 1 % (ref 0–1)
EOS ABS: 0.2 10*3/uL (ref 0.0–0.7)
EOS PCT: 3 % (ref 0–5)
HCT: 37.7 % (ref 36.0–46.0)
Hemoglobin: 12.8 g/dL (ref 12.0–15.0)
LYMPHS ABS: 3 10*3/uL (ref 0.7–4.0)
Lymphocytes Relative: 37 % (ref 12–46)
MCH: 29.7 pg (ref 26.0–34.0)
MCHC: 34 g/dL (ref 30.0–36.0)
MCV: 87.5 fL (ref 78.0–100.0)
MONO ABS: 0.6 10*3/uL (ref 0.1–1.0)
MONOS PCT: 8 % (ref 3–12)
MPV: 8.8 fL (ref 8.6–12.4)
Neutro Abs: 4.1 10*3/uL (ref 1.7–7.7)
Neutrophils Relative %: 51 % (ref 43–77)
PLATELETS: 316 10*3/uL (ref 150–400)
RBC: 4.31 MIL/uL (ref 3.87–5.11)
RDW: 16 % — AB (ref 11.5–15.5)
WBC: 8 10*3/uL (ref 4.0–10.5)

## 2015-08-22 LAB — BASIC METABOLIC PANEL WITH GFR
BUN: 9 mg/dL (ref 7–25)
CALCIUM: 9.1 mg/dL (ref 8.6–10.4)
CO2: 22 mmol/L (ref 20–31)
CREATININE: 0.47 mg/dL — AB (ref 0.50–0.99)
Chloride: 98 mmol/L (ref 98–110)
GFR, Est African American: >89 mL/min (ref >=60)
GFR, Est Non African American: >89 mL/min (ref >=60)
GLUCOSE: 90 mg/dL (ref 65–99)
Potassium: 4.7 mmol/L (ref 3.5–5.3)
Sodium: 132 mmol/L — ABNORMAL LOW (ref 135–146)

## 2015-08-22 LAB — C-REACTIVE PROTEIN

## 2015-08-22 NOTE — Progress Notes (Signed)
Chief complaint: followup for esophageal perforation, cervical diskitis   Subjective:    Patient ID: Ewing Schlein, female    DOB: 1946/09/13, 69 y.o.   MRN: [id number]  HPI   69 y.o. 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 Initially surgery was not going to be performed and we were consulted and placed the patient on broad spectrum vancomycin and meropenem. However ultimately surgery WAS pursued and I stopped her abx 48 hours prior to  joint Surgery b Dr. Shon Baton and Dr. Lazarus Salines on August 18th, 2016with : Direct Laryngoscopy , Esophagoscopy 1. Removal of anterior cervical plate. 2. Exploration of spinal fusion. 3. Incision and drainage of cervical abscess with placement of drains.  Cultures were taken and grew mx organisms but no staph or strep and no predominant species. Dr. Orvan Falconer narrowed the pt to meropenem. Pt had feeding tube placed and is NPO and  Finished more than  8 weeks of postoperative abx   In the interim she had developed an area on her neck where she has had drainage and which PA from [facility] termed a cyst. She was seen Dr. Lazarus Salines for evaluation of this area but he was not convinced it was infected or required further surgery, then seen by Dr Shon Baton who admitted the patient and performed  Incision and drainage of right wound with application of incisional wound VAC on 06/20/15. I do not see that cutures were obtained. This area was superficial and did not extend deeply.  We obtained MRI of her hip which failed to show any infectious pathology and of  Her C spine  On 06/28/15 which showed:  IMPRESSION: 1. Marked improvement in the inflammatory changes in the prevertebral and posterior paraspinal soft tissues as well as decreased edema and enhancement in the C3, C6, and C7 vertebra. 2. Persistent 4 mm retrolisthesis of  C3 on C4 with slight compression of the cervical spinal cord without appreciable myelopathy. The AP dimension of the spinal canal at that level remains at 4.7 mm. 3. Decreased but residual anterior soft tissue swelling primarily at C3-4. This area enhances consistent with residual inflammation or scarring but there is no visible abscess.  She remained on carbapenem but states she was taken off vancomycin at [place] from her first stay and has not been on it except while in the hospital.  She is anxious to come off abx. I have seen her labs from [facility] including ESR and CRP (both normal now)   I felt comfortable changing her to an oral abx in liquid form and she was transitioned to levofloxacin and metronidazole. She did not tolerate the metronidazole well but continue the levofloxacin for at least a month after having been seen by me but now is off it altogether. She has not expressed worsening neck pain she still has some dysphagia but hasn't been told to expect this to persist for some time after having recovered from the repair of her esophageal perforation.   Past Medical History  Diagnosis Date  . Myocardial infarction (HCC) 2000  . Diabetes mellitus type 2, insulin dependent (HCC)   . COPD (chronic obstructive pulmonary disease) (HCC)   . Hyperlipidemia   . OA (osteoarthritis) of knee   . Hypertension   . Coronary artery disease   . Asthma   . Elevated LFTs   . ETOH abuse   . Tobacco abuse   .  Osteoarthritis   . Breast cancer (HCC)   . H/O atrial fibrillation without current medication     only one time when she had sepsis   Past Surgical History  Procedure Laterality Date  . Breast surgery  1991    right mastectomy  . Vulvectomy  1981    partial  . Neck fusion  2011  . Pelvic laparoscopy  2002    RSO  . Cesarean section  '78, '80, '81    x 3  . Hysteroscopy      D & C  . Cardiac catheterization  04/05/2009    EF 60%  . Lobectomy    . Tonsillectomy and adenoidectomy      . Mastectomy    . Coronary angioplasty  08/1998    x2 OF A BIFURCATION OM-1, OM-2 LESION  . Coronary angioplasty with stent placement  01/1999    MID FIRST OBTUSE MARGINAL VESSEL  . Coronary angioplasty with stent placement  07/1999    STENTING AT THE CRUX OF THE RIGHT CORONARY ARTERY WITH A 3.8MM X TETRA STENT  . Cardiovascular stress test  01/31/2005    EF 58%  . Total hip arthroplasty  08/2010    bilat  . Hip closed reduction Right 04/26/2015    Procedure: CLOSED REDUCTION HIP;  Surgeon: Venita Lick, MD;  [facility]: [facility];  Service: Orthopedics;  Laterality: Right;  . Incision and drainage abscess N/A 05/03/2015    Procedure: INCISION AND DRAINAGE CERVICAL  ABSCESS AND REMOVAL OF HARDWARE;  Surgeon: Venita Lick, MD;  [facility]: [facility];  Service: Orthopedics;  Laterality: N/A;  . Hardware removal N/A 05/03/2015    Procedure: HARDWARE REMOVAL;  Surgeon: Venita Lick, MD;  [facility]: [facility];  Service: Orthopedics;  Laterality: N/A;  . Rigid esophagoscopy N/A 05/03/2015    Procedure: RIGID ESOPHAGOSCOPY;  Surgeon: Flo Shanks, MD;  Location: [facility];  Service: ENT;  Laterality: N/A;  . Direct laryngoscopy N/A 05/03/2015    Procedure: DIRECT LARYNGOSCOPY;  Surgeon: Flo Shanks, MD;  Location[facility];  Service: ENT;  Laterality: N/A;  . Gastrostomy N/A 05/04/2015    Procedure: OPEN GASTROSTOMY WITH TUBE PLACEMENT;  Surgeon: Manus Rudd, MD;  [facility];  Service: General;  Laterality: N/A;  . Application of wound vac N/A 06/20/2015    Procedure: APPLICATION OF INCISIONAL WOUND VAC;  Surgeon: Venita Lick, MD;  [facility];  Service: Orthopedics;  Laterality: N/A;  . Radiology with anesthesia Right 06/28/2015    Procedure: MRI OF CERVICAL SPINE  AND RIGHT HIP  WITH AND WITHOUT CONTRAST    (RADIOLOGY WITH ANESTHESIA);  Surgeon: Medication Radiologist, MD;  [facility];  Service: Radiology;  Laterality: Right;   Family History  Problem Relation Age of Onset  . Diabetes Mother    . Hypertension Father   . Heart disease Father   . Heart attack Father   . Stroke Father    Social History  Substance Use Topics  . Smoking status: Current Every Day Smoker -- 0.75 packs/day for 50 years    Types: Cigarettes  . Smokeless tobacco: Never Used  . Alcohol Use: No   Allergies  Allergen Reactions  . Penicillins Rash  . Zithromax [Azithromycin Dihydrate] Other (See Comments)    ORAL ULCERS      Current outpatient prescriptions:  .  albuterol (PROVENTIL HFA;VENTOLIN HFA) 108 (90 BASE) MCG/ACT inhaler, Inhale 2 puffs into the lungs every 6 (six) hours as needed for wheezing. , Disp: , Rfl:  .  ALPRAZolam (XANAX) 0.25 MG tablet, Take one tablet daily or as needed (Patient taking differently: Take 0.25 mg by mouth daily as needed for anxiety. ), Disp: 90 tablet, Rfl: 3 .  aspirin EC 81 MG tablet, Take 1 tablet (81 mg total) by mouth daily., Disp: 30 tablet, Rfl: 0 .  cholestyramine light (PREVALITE) 4 GM/DOSE powder, Take by mouth 3 (three) times daily with meals., Disp: , Rfl:  .  diclofenac (VOLTAREN) 75 MG EC tablet, Take 75 mg by mouth as needed., Disp: , Rfl:  .  furosemide (LASIX) 40 MG tablet, Take 1 tablet (40 mg total) by mouth 2 (two) times daily. (Patient taking differently: Take 40 mg by mouth daily. ), Disp: 180 tablet, Rfl: 3 .  gabapentin (NEURONTIN) 600 MG tablet, Take 600 mg by mouth 4 (four) times daily. , Disp: , Rfl:  .  loperamide (IMODIUM) 2 MG capsule, Take 2 mg by mouth 4 (four) times daily. With feedins  0600,1200,1700,2245, Disp: , Rfl:  .  losartan (COZAAR) 25 MG tablet, Take 25 mg by mouth daily. , Disp: , Rfl: 1 .  morphine (MSIR) 30 MG tablet, Take 30 mg by mouth 4 (four) times daily., Disp: , Rfl:  .  oxyCODONE (ROXICODONE) 15 MG immediate release tablet, Take 15 mg by mouth every 6 (six) hours as needed for pain. , Disp: , Rfl:      Review of Systems  Constitutional: Positive for fatigue and unexpected weight change. Negative for fever,  chills, diaphoresis, activity change and appetite change.  HENT: Positive for postnasal drip and rhinorrhea. Negative for congestion, sinus pressure, sneezing, sore throat and trouble swallowing.   Eyes: Negative for photophobia and visual disturbance.  Respiratory: Negative for cough, chest tightness, shortness of breath, wheezing and stridor.   Cardiovascular: Negative for chest pain, palpitations and leg swelling.  Gastrointestinal: Negative for nausea, vomiting, abdominal pain, diarrhea, constipation, blood in stool, abdominal distention and anal bleeding.  Genitourinary: Negative for dysuria, hematuria, flank pain and difficulty urinating.  Musculoskeletal: Negative for myalgias, back pain, joint swelling, arthralgias and gait problem.  Skin: Positive for wound. Negative for color change, pallor and rash.  Neurological: Negative for dizziness, tremors, weakness and light-headedness.  Hematological: Negative for adenopathy. Does not bruise/bleed easily.  Psychiatric/Behavioral: Negative for behavioral problems, confusion, sleep disturbance, dysphoric mood, decreased concentration and agitation.       Objective:   Physical Exam  Constitutional: She is oriented to person, place, and time. No distress.  HENT:  Head: Normocephalic and atraumatic.  Mouth/Throat: No oropharyngeal exudate.  Eyes: Conjunctivae and EOM are normal. No scleral icterus.  Neck: Normal range of motion. Neck supple.    Cardiovascular: Normal rate, regular rhythm and normal heart sounds.  Exam reveals no gallop.   No murmur heard. Pulmonary/Chest: Effort normal and breath sounds normal. No respiratory distress. She has no wheezes.  Abdominal: Soft. Bowel sounds are normal. She exhibits no distension.  Musculoskeletal: She exhibits no edema or tenderness.  Neurological: She is alert and oriented to person, place, and time. She exhibits normal muscle tone. Coordination normal.  Skin: Skin is warm and dry. No rash  noted. She is not diaphoretic. No erythema. No pallor.  Psychiatric: She has a normal mood and affect. Her behavior is normal. Judgment and thought content normal.        Assessment & Plan:   Cervical diskitis, epidural abscess, mediastinal abscess due to esophageal perforation. Repeat MRI ESR, CRP and clinical picture were encouraging.  We gave  her additional protracted cover with levofloxacin which she is now off of.  We'll recheck inflammatory markers today keeping in mind the fact that her rheumatoid arthritis could skew these numbers. We'll have her come back in 2 months time for repeat exam   Hip dislocations recurrent: worry this site could be infected as well and will image with MRI  Wound infection: sp surgery and resolving  I spent greater than 25  minutes with the patient including greater than 50% of time in face to face counsel of the patient  re her cervical disktis, epidural abscess, esophageal perforation, wound infection, hip dislocations  and in coordination of her care.  Acey Lav, MD

## 2015-08-23 LAB — SEDIMENTATION RATE: Sed Rate: 14 mm/hr (ref 0–30)

## 2015-08-28 DIAGNOSIS — K228 Other specified diseases of esophagus: Secondary | ICD-10-CM | POA: Diagnosis not present

## 2015-08-28 DIAGNOSIS — I10 Essential (primary) hypertension: Secondary | ICD-10-CM | POA: Diagnosis not present

## 2015-08-28 DIAGNOSIS — E119 Type 2 diabetes mellitus without complications: Secondary | ICD-10-CM | POA: Diagnosis not present

## 2015-08-28 DIAGNOSIS — F17200 Nicotine dependence, unspecified, uncomplicated: Secondary | ICD-10-CM | POA: Diagnosis not present

## 2015-08-28 DIAGNOSIS — R197 Diarrhea, unspecified: Secondary | ICD-10-CM | POA: Diagnosis not present

## 2015-08-28 DIAGNOSIS — J069 Acute upper respiratory infection, unspecified: Secondary | ICD-10-CM | POA: Diagnosis not present

## 2015-08-28 DIAGNOSIS — E46 Unspecified protein-calorie malnutrition: Secondary | ICD-10-CM | POA: Diagnosis not present

## 2015-08-28 DIAGNOSIS — I2510 Atherosclerotic heart disease of native coronary artery without angina pectoris: Secondary | ICD-10-CM | POA: Diagnosis not present

## 2015-08-28 DIAGNOSIS — R509 Fever, unspecified: Secondary | ICD-10-CM | POA: Diagnosis not present

## 2015-08-28 DIAGNOSIS — M869 Osteomyelitis, unspecified: Secondary | ICD-10-CM | POA: Diagnosis not present

## 2015-08-28 DIAGNOSIS — M40209 Unspecified kyphosis, site unspecified: Secondary | ICD-10-CM | POA: Diagnosis not present

## 2015-08-28 DIAGNOSIS — J449 Chronic obstructive pulmonary disease, unspecified: Secondary | ICD-10-CM | POA: Diagnosis not present

## 2015-08-31 DIAGNOSIS — Z0189 Encounter for other specified special examinations: Secondary | ICD-10-CM | POA: Diagnosis not present

## 2015-09-05 DIAGNOSIS — Z471 Aftercare following joint replacement surgery: Secondary | ICD-10-CM | POA: Diagnosis not present

## 2015-09-05 DIAGNOSIS — T84028D Dislocation of other internal joint prosthesis, subsequent encounter: Secondary | ICD-10-CM | POA: Diagnosis not present

## 2015-09-05 DIAGNOSIS — Z96641 Presence of right artificial hip joint: Secondary | ICD-10-CM | POA: Diagnosis not present

## 2015-09-18 ENCOUNTER — Telehealth: Payer: Self-pay | Admitting: Cardiology

## 2015-09-18 NOTE — Telephone Encounter (Signed)
She is cleared for surgery from a cardiac standpoint. She needs to be cleared by medical and ID as well.  Peter Martinique MD, [facility]

## 2015-09-18 NOTE — Telephone Encounter (Signed)
Returned call to patient.She stated she is much better.Stated she has no IVs and is off antibiotics.Stated she wanted to make sure ok with Dr.Jordan to have right hip replacement 10/22/15.Message sent to [place].

## 2015-09-18 NOTE — Telephone Encounter (Signed)
Pt is going to have a hip replacement on 10-22-15 with Dr Alvan Dame. She wants to know if she needs to be seen before that or does she just need to be cleared?

## 2015-09-18 NOTE — Telephone Encounter (Signed)
Returned call to patient Dr.Jordan's recommendations given. 

## 2015-09-27 DIAGNOSIS — L603 Nail dystrophy: Secondary | ICD-10-CM | POA: Diagnosis not present

## 2015-09-27 DIAGNOSIS — I739 Peripheral vascular disease, unspecified: Secondary | ICD-10-CM | POA: Diagnosis not present

## 2015-09-27 DIAGNOSIS — L84 Corns and callosities: Secondary | ICD-10-CM | POA: Diagnosis not present

## 2015-09-27 DIAGNOSIS — E1051 Type 1 diabetes mellitus with diabetic peripheral angiopathy without gangrene: Secondary | ICD-10-CM | POA: Diagnosis not present

## 2015-10-03 DIAGNOSIS — G894 Chronic pain syndrome: Secondary | ICD-10-CM | POA: Diagnosis not present

## 2015-10-03 DIAGNOSIS — M5134 Other intervertebral disc degeneration, thoracic region: Secondary | ICD-10-CM | POA: Diagnosis not present

## 2015-10-03 DIAGNOSIS — M546 Pain in thoracic spine: Secondary | ICD-10-CM | POA: Diagnosis not present

## 2015-10-03 DIAGNOSIS — M5136 Other intervertebral disc degeneration, lumbar region: Secondary | ICD-10-CM | POA: Diagnosis not present

## 2015-10-05 NOTE — H&P (Signed)
TOTAL HIP REVISION ADMISSION H&P  Patient is admitted for right revision total hip arthroplasty.  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 female, has a history of pain and functional disability in the right hip due to instability and patient has failed non-surgical conservative treatments for greater than 12 weeks to include NSAID's and/or analgesics and activity modification. The indications for the revision total hip arthroplasty are instability with dislocations.  Onset of symptoms was gradual starting ~1 years ago with gradually worsening course since that time.  Prior procedures on the right hip include arthroplasty.  Patient currently rates pain in the right hip at 6 out of 10 with activity.  There is 3 incidents of dislocations. Patient has evidence of previous THA by imaging studies.  This condition presents safety issues increasing the risk of falls.  There is no current active infection.  Risks, benefits and expectations were discussed with the patient.  Risks including but not limited to the risk of anesthesia, blood clots, nerve damage, blood vessel damage, failure of the prosthesis, infection and up to and including death.  Patient understand the risks, benefits and expectations and wishes to proceed with surgery.   PCP: Anthoney Harada, MD  D/C Plans:      Home with HHPT  Post-op Meds:       No Rx given   Tranexamic Acid:      To be given - IV   Decadron:      Is to be given  FYI:     Continue Morphine  Increase Oxycodone post-op     Patient Active Problem List   Diagnosis Date Noted  . Infected wound, initial encounter 06/20/2015  . Cholecystitis   . Esophageal fistula   . Abscess   . Discitis   . HCAP (healthcare-associated pneumonia)   . 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[place]) 04/28/2015  . Abscess of neck   . Esophageal perforation   . Loss of weight   . Dysphagia, pharyngoesophageal phase   .  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 Hip dislocation, right ([place]) 04/26/2015  . Healthcare-associated pneumonia 04/26/2015  . Cholecystitis, acute 04/26/2015  . 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[place]) 04/26/2015  . PAF (paroxysmal atrial fibrillation) ([place]) 09/20/2014  . Elevated troponin 09/20/2014  . Acute renal failure syndrome ([place])   . Malnutrition of moderate degree ([place]) 08/29/2014  . Pneumonia   . Respiratory failure ([place])   . UTI (urinary tract infection) 08/27/2014  . Sepsis ([place]) 08/27/2014  . Atrial fibrillation with RVR ([place]) 08/27/2014  . Shock circulatory ([place]) 08/27/2014  . Acute kidney injury ([place]) 08/27/2014  . Encephalopathy acute 08/27/2014  . Hypokalemia 08/26/2014  . Hyponatremia 08/26/2014  . Acute renal failure ([place]) 08/26/2014  . Weakness 08/26/2014  . ETOH abuse 08/07/2014  . Edema extremities 07/12/2014  . Atelectasis 08/16/2011  . Diabetes mellitus type 2, insulin dependent ([place])   . COPD (chronic obstructive pulmonary disease) ([place])   . Tobacco dependence   . Hyperlipidemia   . Hypertension   . Coronary artery disease    Past Medical History  Diagnosis Date  . Myocardial infarction ([place]) 2000  . Diabetes mellitus type 2, insulin dependent ([place])   . COPD (chronic obstructive pulmonary disease) ([place])   . Hyperlipidemia   . OA (osteoarthritis) of knee   . Hypertension   . Coronary artery disease   . Asthma   . Elevated LFTs   .  ETOH abuse   . Tobacco abuse   . Osteoarthritis   . Breast cancer ([place])   . H/O atrial fibrillation without current medication     only one time when she had sepsis    Past Surgical History  Procedure Laterality Date  . Breast surgery  1991    right mastectomy  . Vulvectomy  1981    partial  . Neck fusion  2011  . Pelvic laparoscopy  2002    RSO  . Cesarean section  '78, '80, '81    x 3  . Hysteroscopy      D & C  . Cardiac catheterization  04/05/2009    EF 60%  . Lobectomy    .  Tonsillectomy and adenoidectomy    . Mastectomy    . Coronary angioplasty  08/1998    x2 OF A BIFURCATION OM-1, OM-2 LESION  . Coronary angioplasty with stent placement  01/1999    MID FIRST OBTUSE MARGINAL VESSEL  . Coronary angioplasty with stent placement  07/1999    STENTING AT THE CRUX OF THE RIGHT CORONARY ARTERY WITH A 3.8MM X 18MM TETRA STENT  . Cardiovascular stress test  01/31/2005    EF 58%  . Total hip arthroplasty  08/2010    bilat  . Hip closed reduction Right 04/26/2015    Procedure: CLOSED REDUCTION HIP;  Surgeon: Melina Schools, MD;  Location: [facility];  Service: Orthopedics;  Laterality: Right;  . Incision and drainage abscess N/A 05/03/2015    Procedure: INCISION AND DRAINAGE CERVICAL  ABSCESS AND REMOVAL OF HARDWARE;  Surgeon: Melina Schools, MD;  Location: [place];  Service: Orthopedics;  Laterality: N/A;  . Hardware removal N/A 05/03/2015    Procedure: HARDWARE REMOVAL;  Surgeon: Melina Schools, MD;  Location: [place];  Service: Orthopedics;  Laterality: N/A;  . Rigid esophagoscopy N/A 05/03/2015    Procedure: RIGID ESOPHAGOSCOPY;  Surgeon: Jodi Marble, MD;  Location: [place];  Service: ENT;  Laterality: N/A;  . Direct laryngoscopy N/A 05/03/2015    Procedure: DIRECT LARYNGOSCOPY;  Surgeon: Jodi Marble, MD;  Location: [place];  Service: ENT;  Laterality: N/A;  . Gastrostomy N/A 05/04/2015    Procedure: OPEN GASTROSTOMY WITH TUBE PLACEMENT;  Surgeon: Donnie Mesa, MD;  Location: [place];  Service: General;  Laterality: N/A;  . Application of wound vac N/A 06/20/2015    Procedure: APPLICATION OF INCISIONAL WOUND VAC;  Surgeon: Melina Schools, MD;  Location: [place];  Service: Orthopedics;  Laterality: N/A;  . Radiology with anesthesia Right 06/28/2015    Procedure: MRI OF CERVICAL SPINE  AND RIGHT HIP  WITH AND WITHOUT CONTRAST    (RADIOLOGY WITH ANESTHESIA);  Surgeon: Medication Radiologist, MD;  Location: [place];  Service: Radiology;  Laterality: Right;    No prescriptions prior to  admission   Allergies  Allergen Reactions  . Penicillins Rash  . Zithromax [Azithromycin Dihydrate] Other (See Comments)    ORAL ULCERS     Social History  Substance Use Topics  . Smoking status: Current Every Day Smoker -- 0.75 packs/day for 50 years    Types: Cigarettes  . Smokeless tobacco: Never Used  . Alcohol Use: No    Family History  Problem Relation Age of Onset  . Diabetes Mother   . Hypertension Father   . Heart disease Father   . Heart attack Father   . Stroke Father       Review of Systems  Constitutional: Positive for weight loss.  Eyes: Negative.  Respiratory: Negative.   Cardiovascular: Negative.   Gastrointestinal: Negative.   Genitourinary: Negative.   Musculoskeletal: Positive for back pain, joint pain and neck pain.  Skin: Negative.   Neurological: Positive for weakness.  Endo/Heme/Allergies: Negative.   Psychiatric/Behavioral: Negative.     Objective:  Physical Exam  Constitutional: She is oriented to person, place, and time. She appears well-developed.  HENT:  Head: Normocephalic.  Eyes: Pupils are equal, round, and reactive to light.  Neck: Neck supple. No JVD present. No tracheal deviation present. No thyromegaly present.  Cardiovascular: Normal rate, regular rhythm, normal heart sounds and intact distal pulses.   Respiratory: Effort normal and breath sounds normal. No stridor. No respiratory distress. She has no wheezes.  GI: Soft. There is no tenderness. There is no guarding.  Musculoskeletal:       Right hip: She exhibits decreased range of motion, decreased strength, tenderness, bony tenderness and laceration (healed previous incision). She exhibits no swelling and no deformity.  Lymphadenopathy:    She has no cervical adenopathy.  Neurological: She is alert and oriented to person, place, and time. A sensory deficit (Numbness and tingling in fingers of both hands) is present.  Skin: Skin is warm and dry.  Psychiatric: She has a  normal mood and affect.      Labs:  Estimated body mass index is 22.50 kg/(m^2) as calculated from the following:   Height as of 08/22/15: 5\' 3"  (1.6 m).   Weight as of 08/22/15: 57.607 kg (127 lb).  Imaging Review:  Plain radiographs demonstrate previous THA of the right hip(s). The bone quality appears to be good for age and reported activity level.   Assessment/Plan:  Right hip with failed previous arthroplasty.  The patient history, physical examination, clinical judgement of the provider and imaging studies are consistent with failure of the right hip(s), previous total hip arthroplasty. Revision total hip arthroplasty is deemed medically necessary. The treatment options including medical management, injection therapy, arthroscopy and arthroplasty were discussed at length. The risks and benefits of total hip arthroplasty were presented and reviewed. The risks due to aseptic loosening, infection, stiffness, dislocation/subluxation,  thromboembolic complications and other imponderables were discussed.  The patient acknowledged the explanation, agreed to proceed with the plan and consent was signed. Patient is being admitted for inpatient treatment for surgery, pain control, PT, OT, prophylactic antibiotics, VTE prophylaxis, progressive ambulation and ADL's and discharge planning. The patient is planning to be discharged home with home health services.      [place] P[place] Casmer Yepiz   PA-C  10/05/2015, 9:31 AM

## 2015-10-12 ENCOUNTER — Telehealth: Payer: Self-pay | Admitting: Cardiology

## 2015-10-12 ENCOUNTER — Other Ambulatory Visit (HOSPITAL_COMMUNITY): Payer: Self-pay | Admitting: *Deleted

## 2015-10-12 NOTE — Progress Notes (Signed)
CARDIAC CLEARNCE NOTE DR Martinique 09-18-15 EPIC CHEST 06-20-15 EPIC ECHO 08-28-14 EPIC STRESS TEST 07-27-14 EPIC EKG 04-26-15 EPIC SPOKE WITH SHERRY WILLS AT [facility] AND MADE AWARE PT NEEDS INFECTIONS DISEAES AND MEDICAL CLEARANCE PER DR JORDN;S CLEARNCE NOTE, SHERRY TO CALL PATIENT AND TELL HER AND ASKED [place] TO REMIND PT OF NEEDED CLEARNCES AT PRE OP [place].

## 2015-10-12 NOTE — Patient Instructions (Addendum)
[name]  10/12/2015   Your procedure is scheduled on: 10-22-15  Report to [facility]  Entrance take [facility]  elevators to 3rd floor to  [place] at  1130 AM.  Call this number if you have problems the morning of surgery [phone]   Remember: ONLY 1 PERSON MAY GO WITH YOU TO SHORT STAY TO GET  READY MORNING OF [place].   Do not eat food  :After Midnight, clear liquids midnight until 730 am day of sugreyr, nothing by mouth after 730 am day of surgery.     Take these medicines the morning of surgery with A SIP OF WATER: SYMBICORT AND ALBUTEROL INHALER IF NEEDED AND BRING INHALERS WITH YOU, ALPRAZOLAM (XANAX) IF NEEDED GABAPENTIN                                       (NEURONTIN), OXYCODONE IR, Morphine              DO NOT TAKE ANY DIABETIC MEDICATIONS DAY OF YOUR SURGERY!                               You may not have any metal on your body including hair pins and              piercings  Do not wear jewelry, make-up, lotions, powders or perfumes, deodorant             Do not wear nail polish.  Do not shave  48 hours prior to surgery.              Men may shave face and neck.   Do not bring valuables to the hospital. [place].  Contacts, dentures or bridgework may not be worn into surgery.  Leave suitcase in the car. After surgery it may be brought to your room.                   Please read over the following fact sheets you were given: _____________________________________________________________________                CLEAR LIQUID DIET   Foods Allowed                                                                     Foods Excluded  Coffee and tea, regular and decaf                             liquids that you cannot  Plain Jell-O in any flavor                                             see through such as: Fruit ices (not with fruit pulp)  milk, soups, orange juice  Iced Popsicles                                    All solid food Carbonated beverages, regular and diet                                    Cranberry, grape and apple juices Sports drinks like Gatorade Lightly seasoned clear broth or consume(fat free) Sugar, honey syrup  Sample Menu Breakfast                                Lunch                                     Supper Cranberry juice                    Beef broth                            Chicken broth Jell-O                                     Grape juice                           Apple juice Coffee or tea                        Jell-O                                      Popsicle                                                Coffee or tea                        Coffee or tea  _____________________________________________________________________  [facility] - Preparing for Surgery Before surgery, you can play an important role.  Because skin is not sterile, your skin needs to be as free of germs as possible.  You can reduce the number of germs on your skin by washing with CHG (chlorahexidine gluconate) soap before surgery.  CHG is an antiseptic cleaner which kills germs and bonds with the skin to continue killing germs even after washing. Please DO NOT use if you have an allergy to CHG or antibacterial soaps.  If your skin becomes reddened/irritated stop using the CHG and inform your nurse when you arrive at Short Stay. Do not shave (including legs and underarms) for at least 48 hours prior to the first CHG shower.  You may shave your face/neck. Please follow these instructions carefully:  1.  Shower with CHG Soap the night before surgery and the  morning of Surgery.  2.  If you choose to wash  your hair, wash your hair first as usual with your  normal  shampoo.  3.  After you shampoo, rinse your hair and body thoroughly to remove the  shampoo.                           4.  Use CHG as you would any other  liquid soap.  You can apply chg directly  to the skin and wash                       Gently with a scrungie or clean washcloth.  5.  Apply the CHG Soap to your body ONLY FROM THE NECK DOWN.   Do not use on face/ open                           Wound or open sores. Avoid contact with eyes, ears mouth and genitals (private parts).                       Wash face,  Genitals (private parts) with your normal soap.             6.  Wash thoroughly, paying special attention to the area where your surgery  will be performed.  7.  Thoroughly rinse your body with warm water from the neck down.  8.  DO NOT shower/wash with your normal soap after using and rinsing off  the CHG Soap.                9.  Pat yourself dry with a clean towel.            10.  Wear clean pajamas.            11.  Place clean sheets on your bed the night of your first shower and do not  sleep with pets. Day of Surgery : Do not apply any lotions/deodorants the morning of surgery.  Please wear clean clothes to the hospital/surgery center.  FAILURE TO FOLLOW THESE INSTRUCTIONS MAY RESULT IN THE CANCELLATION OF YOUR SURGERY PATIENT SIGNATURE_________________________________  NURSE SIGNATURE__________________________________  ________________________________________________________________________   [name]  An incentive spirometer is a tool that can help keep your lungs clear and active. This tool measures how well you are filling your lungs with each breath. Taking long deep breaths may help reverse or decrease the chance of developing breathing (pulmonary) problems (especially infection) following:  A long period of time when you are unable to move or be active. BEFORE THE PROCEDURE   If the spirometer includes an indicator to show your best effort, your nurse or respiratory therapist will set it to a desired goal.  If possible, sit up straight or lean slightly forward. Try not to slouch.  Hold the incentive  spirometer in an upright position. INSTRUCTIONS FOR USE   Sit on the edge of your bed if possible, or sit up as far as you can in bed or on a chair.  Hold the incentive spirometer in an upright position.  Breathe out normally.  Place the mouthpiece in your mouth and seal your lips tightly around it.  Breathe in slowly and as deeply as possible, raising the piston or the ball toward the top of the column.  Hold your breath for 3-5 seconds or for as long as possible. Allow the piston or ball to fall to the bottom  of the column.  Remove the mouthpiece from your mouth and breathe out normally.  Rest for a few seconds and repeat Steps 1 through 7 at least 10 times every 1-2 hours when you are awake. Take your time and take a few normal breaths between deep breaths.  The spirometer may include an indicator to show your best effort. Use the indicator as a goal to work toward during each repetition.  After each set of 10 deep breaths, practice coughing to be sure your lungs are clear. If you have an incision (the cut made at the time of surgery), support your incision when coughing by placing a pillow or rolled up towels firmly against it. Once you are able to get out of bed, walk around indoors and cough well. You may stop using the incentive spirometer when instructed by your caregiver.  RISKS AND COMPLICATIONS  Take your time so you do not get dizzy or light-headed.  If you are in pain, you may need to take or ask for pain medication before doing incentive spirometry. It is harder to take a deep breath if you are having pain. AFTER USE  Rest and breathe slowly and easily.  It can be helpful to keep track of a log of your progress. Your caregiver can provide you with a simple table to help with this. If you are using the spirometer at home, follow these instructions: [place] IF:   You are having difficultly using the spirometer.  You have trouble using the spirometer as  often as instructed.  Your pain medication is not giving enough relief while using the spirometer.  You develop fever of 100.5 F (38.1 C) or higher. SEEK IMMEDIATE MEDICAL CARE IF:   You cough up bloody sputum that had not been present before.  You develop fever of 102 F (38.9 C) or greater.  You develop worsening pain at or near the incision site. MAKE SURE YOU:   Understand these instructions.  Will watch your condition.  Will get help right away if you are not doing well or get worse. Document Released: 01/12/2007 Document Revised: 11/24/2011 Document Reviewed: 03/15/2007 Exit[employer] Patient Information 2014 [employer], [place].   ________________________________________________________________________  WHAT IS A BLOOD TRANSFUSION? Blood Transfusion Information  A transfusion is the replacement of blood or some of its parts. Blood is made up of multiple cells which provide different functions.  Red blood cells carry oxygen and are used for blood loss replacement.  White blood cells fight against infection.  Platelets control bleeding.  Plasma helps clot blood.  Other blood products are available for specialized needs, such as hemophilia or other clotting disorders. BEFORE THE TRANSFUSION  Who gives blood for transfusions?   Healthy volunteers who are fully evaluated to make sure their blood is safe. This is blood bank blood. Transfusion therapy is the safest it has ever been in the practice of medicine. Before blood is taken from a donor, a complete history is taken to make sure that person has no history of diseases nor engages in risky social behavior (examples are intravenous drug use or sexual activity with multiple partners). The donor's travel history is screened to minimize risk of transmitting infections, such as malaria. The donated blood is tested for signs of infectious diseases, such as HIV and hepatitis. The blood is then tested to be sure it is compatible with  you in order to minimize the chance of a transfusion reaction. If you or a relative donates blood, this is  often done in anticipation of surgery and is not appropriate for emergency situations. It takes many days to process the donated blood. RISKS AND COMPLICATIONS Although transfusion therapy is very safe and saves many lives, the main dangers of transfusion include:   Getting an infectious disease.  Developing a transfusion reaction. This is an allergic reaction to something in the blood you were given. Every precaution is taken to prevent this. The decision to have a blood transfusion has been considered carefully by your caregiver before blood is given. Blood is not given unless the benefits outweigh the risks. AFTER THE TRANSFUSION  Right after receiving a blood transfusion, you will usually feel much better and more energetic. This is especially true if your red blood cells have gotten low (anemic). The transfusion raises the level of the red blood cells which carry oxygen, and this usually causes an energy increase.  The nurse administering the transfusion will monitor you carefully for complications. HOME CARE INSTRUCTIONS  No special instructions are needed after a transfusion. You may find your energy is better. Speak with your caregiver about any limitations on activity for underlying diseases you may have. SEEK MEDICAL CARE IF:   Your condition is not improving after your transfusion.  You develop redness or irritation at the intravenous (IV) site. SEEK IMMEDIATE MEDICAL CARE IF:  Any of the following symptoms occur over the next 12 hours:  Shaking chills.  You have a temperature by mouth above 102 F (38.9 C), not controlled by medicine.  Chest, back, or muscle pain.  People around you feel you are not acting correctly or are confused.  Shortness of breath or difficulty breathing.  Dizziness and fainting.  You get a rash or develop hives.  You have a decrease in  urine output.  Your urine turns a dark color or changes to pink, red, or brown. Any of the following symptoms occur over the next 10 days:  You have a temperature by mouth above 102 F (38.9 C), not controlled by medicine.  Shortness of breath.  Weakness after normal activity.  The white part of the eye turns yellow (jaundice).  You have a decrease in the amount of urine or are urinating less often.  Your urine turns a dark color or changes to pink, red, or brown. Document Released: 08/29/2000 Document Revised: 11/24/2011 Document Reviewed: 04/17/2008 [facility] Patient Information 2014 [place], [place].  _______________________________________________________________________

## 2015-10-12 NOTE — Telephone Encounter (Signed)
Returned call to [place] - she notifies me they have found the clearance, this was already sent. No further needs.

## 2015-10-12 NOTE — Telephone Encounter (Signed)
[place] is calling in wanting to check on the pt's clearance for surgery. She states that the pt is currently in the office. Please f/u with her asap  Thanks

## 2015-10-15 ENCOUNTER — Encounter (HOSPITAL_COMMUNITY): Payer: Self-pay

## 2015-10-15 ENCOUNTER — Encounter (HOSPITAL_COMMUNITY)
Admission: RE | Admit: 2015-10-15 | Discharge: 2015-10-15 | Disposition: A | Discharge status: Home / Self Care | Payer: Medicare Other | Source: Ambulatory Visit | Attending: Orthopedic Surgery | Admitting: Orthopedic Surgery

## 2015-10-15 DIAGNOSIS — E119 Type 2 diabetes mellitus without complications: Secondary | ICD-10-CM | POA: Diagnosis not present

## 2015-10-15 DIAGNOSIS — Z853 Personal history of malignant neoplasm of breast: Secondary | ICD-10-CM | POA: Insufficient documentation

## 2015-10-15 DIAGNOSIS — E785 Hyperlipidemia, unspecified: Secondary | ICD-10-CM | POA: Insufficient documentation

## 2015-10-15 DIAGNOSIS — I252 Old myocardial infarction: Secondary | ICD-10-CM | POA: Diagnosis not present

## 2015-10-15 DIAGNOSIS — Z01812 Encounter for preprocedural laboratory examination: Secondary | ICD-10-CM | POA: Diagnosis not present

## 2015-10-15 DIAGNOSIS — Y838 Other surgical procedures as the cause of abnormal reaction of the patient, or of later complication, without mention of misadventure at the time of the procedure: Secondary | ICD-10-CM | POA: Insufficient documentation

## 2015-10-15 DIAGNOSIS — F17210 Nicotine dependence, cigarettes, uncomplicated: Secondary | ICD-10-CM | POA: Diagnosis not present

## 2015-10-15 DIAGNOSIS — T84020A Dislocation of internal right hip prosthesis, initial encounter: Secondary | ICD-10-CM | POA: Diagnosis not present

## 2015-10-15 DIAGNOSIS — Z794 Long term (current) use of insulin: Secondary | ICD-10-CM | POA: Insufficient documentation

## 2015-10-15 DIAGNOSIS — I2510 Atherosclerotic heart disease of native coronary artery without angina pectoris: Secondary | ICD-10-CM | POA: Insufficient documentation

## 2015-10-15 DIAGNOSIS — I10 Essential (primary) hypertension: Secondary | ICD-10-CM | POA: Insufficient documentation

## 2015-10-15 DIAGNOSIS — Z0183 Encounter for blood typing: Secondary | ICD-10-CM | POA: Insufficient documentation

## 2015-10-15 DIAGNOSIS — J45909 Unspecified asthma, uncomplicated: Secondary | ICD-10-CM | POA: Diagnosis not present

## 2015-10-15 DIAGNOSIS — J449 Chronic obstructive pulmonary disease, unspecified: Secondary | ICD-10-CM | POA: Diagnosis not present

## 2015-10-15 HISTORY — DX: Emphysema, unspecified: J43.9

## 2015-10-15 LAB — URINALYSIS, ROUTINE W REFLEX MICROSCOPIC
Bilirubin Urine: NEGATIVE
Glucose, UA: NEGATIVE mg/dL
HGB URINE DIPSTICK: NEGATIVE
Ketones, ur: NEGATIVE mg/dL
Leukocytes, UA: NEGATIVE
Nitrite: NEGATIVE
PH: 6.5 (ref 5.0–8.0)
Protein, ur: NEGATIVE mg/dL
SPECIFIC GRAVITY, URINE: 1.005 (ref 1.005–1.030)

## 2015-10-15 LAB — CBC
HCT: 41.3 % (ref 36.0–46.0)
HEMOGLOBIN: 14 g/dL (ref 12.0–15.0)
MCH: 29.9 pg (ref 26.0–34.0)
MCHC: 33.9 g/dL (ref 30.0–36.0)
MCV: 88.2 fL (ref 78.0–100.0)
PLATELETS: 309 10*3/uL (ref 150–400)
RBC: 4.68 MIL/uL (ref 3.87–5.11)
RDW: 14.2 % (ref 11.5–15.5)
WBC: 9 10*3/uL (ref 4.0–10.5)

## 2015-10-15 LAB — BASIC METABOLIC PANEL
ANION GAP: 9 (ref 5–15)
BUN: 12 mg/dL (ref 6–20)
CALCIUM: 9.2 mg/dL (ref 8.9–10.3)
CO2: 28 mmol/L (ref 22–32)
Chloride: 97 mmol/L — ABNORMAL LOW (ref 101–111)
Creatinine, Ser: 0.53 mg/dL (ref 0.44–1.00)
GLUCOSE: 114 mg/dL — AB (ref 65–99)
POTASSIUM: 4.5 mmol/L (ref 3.5–5.1)
Sodium: 134 mmol/L — ABNORMAL LOW (ref 135–145)

## 2015-10-15 LAB — APTT: APTT: 30 s (ref 24–37)

## 2015-10-15 LAB — SURGICAL PCR SCREEN
MRSA, PCR: NEGATIVE
Staphylococcus aureus: NEGATIVE

## 2015-10-15 LAB — PROTIME-INR
INR: 1.07 (ref 0.00–1.49)
PROTHROMBIN TIME: 13.7 s (ref 11.6–15.2)

## 2015-10-16 ENCOUNTER — Ambulatory Visit (INDEPENDENT_AMBULATORY_CARE_PROVIDER_SITE_OTHER): Payer: Medicare Other | Admitting: Infectious Disease

## 2015-10-16 ENCOUNTER — Encounter: Payer: Self-pay | Admitting: Infectious Disease

## 2015-10-16 VITALS — BP 149/85 | HR 80 | Temp 98.4°F | Ht 63.0 in | Wt 134.0 lb

## 2015-10-16 DIAGNOSIS — M4622 Osteomyelitis of vertebra, cervical region: Secondary | ICD-10-CM

## 2015-10-16 DIAGNOSIS — K2289 Other specified disease of esophagus: Secondary | ICD-10-CM

## 2015-10-16 DIAGNOSIS — K223 Perforation of esophagus: Secondary | ICD-10-CM | POA: Diagnosis not present

## 2015-10-16 DIAGNOSIS — K228 Other specified diseases of esophagus: Secondary | ICD-10-CM

## 2015-10-16 DIAGNOSIS — S73004D Unspecified dislocation of right hip, subsequent encounter: Secondary | ICD-10-CM

## 2015-10-16 LAB — HEMOGLOBIN A1C
HEMOGLOBIN A1C: 6.4 % — AB (ref 4.8–5.6)
MEAN PLASMA GLUCOSE: 137 mg/dL

## 2015-10-16 NOTE — Progress Notes (Signed)
Chief complaint: followup for esophageal perforation, cervical diskitis   Subjective:    Patient ID: [name], female    DOB: 1946/08/14, 70 y.o.   MRN: [id number]  HPI   70 y.o. 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 Initially surgery was not going to be performed and we were consulted and placed the patient on broad spectrum vancomycin and meropenem. However ultimately surgery WAS pursued and I stopped her abx 48 hours prior to  joint Surgery b Dr. Shon Baton and Dr. Lazarus Salines on August 18th, 2016with : Direct Laryngoscopy , Esophagoscopy 1. Removal of anterior cervical plate. 2. Exploration of spinal fusion. 3. Incision and drainage of cervical abscess with placement of drains.  Cultures were taken and grew mx organisms but no staph or strep and no predominant species. Dr. Orvan Falconer narrowed the pt to meropenem. Pt had feeding tube placed and is NPO and  Finished more than  8 weeks of postoperative abx   In the interim she had developed an area on her neck where she has had drainage and which PA from [facility] termed a cyst. She was seen Dr. Lazarus Salines for evaluation of this area but he was not convinced it was infected or required further surgery, then seen by Dr Shon Baton who admitted the patient and performed  Incision and drainage of right wound with application of incisional wound VAC on 06/20/15. I do not see that cutures were obtained. This area was superficial and did not extend deeply.  We obtained MRI of her hip which failed to show any infectious pathology and of  Her C spine  On 06/28/15 which showed:  IMPRESSION: 1. Marked improvement in the inflammatory changes in the prevertebral and posterior paraspinal soft tissues as well as decreased edema and enhancement in the C3, C6, and C7 vertebra. 2. Persistent 4 mm retrolisthesis of  C3 on C4 with slight compression of the cervical spinal cord without appreciable myelopathy. The AP dimension of the spinal canal at that level remains at 4.7 mm. 3. Decreased but residual anterior soft tissue swelling primarily at C3-4. This area enhances consistent with residual inflammation or scarring but there is no visible abscess.  She remained on carbapenem but states she was taken off vancomycin at [place] from her first stay and has not been on it except while in the hospital.  She is anxious to come off abx. I have seen her labs from [facility] including ESR and CRP (both normal now)   I felt comfortable changing her to an oral abx in liquid form and she was transitioned to levofloxacin and metronidazole. She did not tolerate the metronidazole well but continue the levofloxacin for at least a month after having been seen by me bu then was off it altogether. She had not expressed worsening neck pain she still has some dysphagia when I last saw her in December he inflammatory markers were normal. She is not on DMARDS for her RA.  She now returns to clinic for followup and has continued to feel better. She has no complaints today.    Past Medical History  Diagnosis Date  . Myocardial infarction (HCC) 2000  . COPD (chronic obstructive pulmonary disease) (HCC)   . Hyperlipidemia   . OA (osteoarthritis) of knee   . Hypertension   . Coronary artery disease   . Asthma   . Elevated LFTs   . ETOH abuse   .  Tobacco abuse   . Osteoarthritis   . H/O atrial fibrillation without current medication     only one time when she had sepsis  . Breast cancer (HCC)     right breast  . Emphysema lung [facility])    Past Surgical History  Procedure Laterality Date  . Breast surgery  1991    right mastectomy  . Vulvectomy  1981    partial  . Neck fusion  2011  . Pelvic laparoscopy  2002    RSO-    . Cesarean section  '78, '80, '81    x 3  . Hysteroscopy      D & C  . Cardiac catheterization   04/05/2009    EF 60%  . Tonsillectomy and adenoidectomy    . Mastectomy    . Coronary angioplasty  08/1998    x2 OF A BIFURCATION OM-1, OM-2 LESION  . Coronary angioplasty with stent placement  01/1999    MID FIRST OBTUSE MARGINAL VESSEL  . Coronary angioplasty with stent placement  07/1999    STENTING AT THE CRUX OF THE RIGHT CORONARY ARTERY WITH A 3.8MM X TETRA STENT  . Cardiovascular stress test  01/31/2005    EF 58%  . Total hip arthroplasty  08/2010    bilat  . Hip closed reduction Right 04/26/2015    Procedure: CLOSED REDUCTION HIP;  Surgeon: Venita Lick, MD;  Location: [facility] ORS;  Service: Orthopedics;  Laterality: Right;  . Incision and drainage abscess N/A 05/03/2015    Procedure: INCISION AND DRAINAGE CERVICAL  ABSCESS AND REMOVAL OF HARDWARE;  Surgeon: Venita Lick, MD;  Location: [facility] OR;  Service: Orthopedics;  Laterality: N/A;  . Hardware removal N/A 05/03/2015    Procedure: HARDWARE REMOVAL;  Surgeon: Venita Lick, MD;  Location: [facility] OR;  Service: Orthopedics;  Laterality: N/A;  . Rigid esophagoscopy N/A 05/03/2015    Procedure: RIGID ESOPHAGOSCOPY;  Surgeon: Flo Shanks, MD;  Location: [facility] OR;  Service: ENT;  Laterality: N/A;  . Direct laryngoscopy N/A 05/03/2015    Procedure: DIRECT LARYNGOSCOPY;  Surgeon: Flo Shanks, MD;  Location: [facility] OR;  Service: ENT;  Laterality: N/A;  . Gastrostomy N/A 05/04/2015    Procedure: OPEN GASTROSTOMY WITH TUBE PLACEMENT;  Surgeon: Manus Rudd, MD;  Location: [facility] OR;  Service: General;  Laterality: N/A;  . Application of wound vac N/A 06/20/2015    Procedure: APPLICATION OF INCISIONAL WOUND VAC;  Surgeon: Venita Lick, MD;  Location: [facility] OR;  Service: Orthopedics;  Laterality: N/A;  . Radiology with anesthesia Right 06/28/2015    Procedure: MRI OF CERVICAL SPINE  AND RIGHT HIP  WITH AND WITHOUT CONTRAST    (RADIOLOGY WITH ANESTHESIA);  Surgeon: Medication Radiologist, MD;  Location: [facility] OR;  Service: Radiology;  Laterality: Right;  . Joint  replacement  08/2011    bilateral hip  . Joint replacement  01/2012    right hip  . Eye surgery  05/18/2014,06/01/2014    BILATERAL CATARACT S WITH LENS IMPLANTS   Family History  Problem Relation Age of Onset  . Diabetes Mother   . Hypertension Father   . Heart disease Father   . Heart attack Father   . Stroke Father    Social History  Substance Use Topics  . Smoking status: Current Every Day Smoker -- 1.00 packs/day for 50 years    Types: Cigarettes  . Smokeless tobacco: Never Used  . Alcohol Use: No   Allergies  Allergen Reactions  . Penicillins Rash  Has patient had a PCN reaction causing immediate rash, facial/tongue/throat swelling, SOB or lightheadedness with hypotension: unsure Has patient had a PCN reaction causing severe rash involving mucus membranes or skin necrosis: unsure Has patient had a PCN reaction that required hospitalization No Has patient had a PCN reaction occurring within the last 10 years: Yes If all of the above answers are "NO", then may proceed with Cephalosporin use.  . Zithromax [Azithromycin Dihydrate] Other (See Comments)    ORAL ULCERS      Current outpatient prescriptions:  .  albuterol (PROVENTIL HFA;VENTOLIN HFA) 108 (90 BASE) MCG/ACT inhaler, Inhale 2 puffs into the lungs every 6 (six) hours as needed for wheezing. , Disp: , Rfl:  .  ALPRAZolam (XANAX) 0.25 MG tablet, Take one tablet daily or as needed (Patient taking differently: Take 0.25 mg by mouth daily as needed for anxiety. ), Disp: 90 tablet, Rfl: 3 .  aspirin EC 81 MG tablet, Take 1 tablet (81 mg total) by mouth daily., Disp: 30 tablet, Rfl: 0 .  diclofenac (VOLTAREN) 75 MG EC tablet, Take 75 mg by mouth 2 (two) times daily as needed for mild pain. , Disp: , Rfl:  .  furosemide (LASIX) 40 MG tablet, Take 1 tablet (40 mg total) by mouth 2 (two) times daily. (Patient taking differently: Take 40 mg by mouth daily. ), Disp: 180 tablet, Rfl: 3 .  gabapentin (NEURONTIN) 600 MG tablet,  Take 600 mg by mouth 4 (four) times daily. , Disp: , Rfl:  .  losartan (COZAAR) 25 MG tablet, Take 25 mg by mouth daily. , Disp: , Rfl: 1 .  morphine (MSIR) 30 MG tablet, Take 30 mg by mouth 4 (four) times daily., Disp: , Rfl:  .  oxyCODONE (ROXICODONE) 15 MG immediate release tablet, Take 15 mg by mouth 4 (four) times daily. , Disp: , Rfl:  .  Probiotic Product (PROBIOTIC PO), Take 1 capsule by mouth every other day., Disp: , Rfl:  .  budesonide-formoterol (SYMBICORT) 160-4.5 MCG/ACT inhaler, Inhale 2 puffs into the lungs 2 (two) times daily as needed (Wheezing). Reported on 10/16/2015, Disp: , Rfl:      Review of Systems  Constitutional: Negative for fever, chills, diaphoresis, activity change, appetite change, fatigue and unexpected weight change.  HENT: Negative for congestion, postnasal drip, rhinorrhea, sinus pressure, sneezing, sore throat and trouble swallowing.   Eyes: Negative for photophobia and visual disturbance.  Respiratory: Negative for cough, chest tightness, shortness of breath, wheezing and stridor.   Cardiovascular: Negative for chest pain, palpitations and leg swelling.  Gastrointestinal: Negative for nausea, vomiting, abdominal pain, diarrhea, constipation, blood in stool, abdominal distention and anal bleeding.  Genitourinary: Negative for dysuria, hematuria, flank pain and difficulty urinating.  Musculoskeletal: Negative for myalgias, back pain, joint swelling, arthralgias and gait problem.  Skin: Positive for wound. Negative for color change, pallor and rash.  Neurological: Negative for dizziness, tremors, weakness and light-headedness.  Hematological: Negative for adenopathy. Does not bruise/bleed easily.  Psychiatric/Behavioral: Negative for behavioral problems, confusion, sleep disturbance, dysphoric mood, decreased concentration and agitation.       Objective:   Physical Exam  Constitutional: She is oriented to person, place, and time. No distress.  HENT:    Head: Normocephalic and atraumatic.  Mouth/Throat: No oropharyngeal exudate.  Eyes: Conjunctivae and EOM are normal. No scleral icterus.  Neck: Normal range of motion. Neck supple.  Cardiovascular: Normal rate, regular rhythm and normal heart sounds.  Exam reveals no gallop.   No murmur heard. Pulmonary/Chest:  Effort normal and breath sounds normal. No respiratory distress. She has no wheezes.  Abdominal: Soft. Bowel sounds are normal. She exhibits no distension.  Musculoskeletal: She exhibits no edema or tenderness.  Neurological: She is alert and oriented to person, place, and time. She exhibits normal muscle tone. Coordination normal.  Skin: Skin is warm and dry. No rash noted. She is not diaphoretic. No erythema. No pallor.  Psychiatric: She has a normal mood and affect. Her behavior is normal. Judgment and thought content normal.   Neck wound healed 10/16/15:         Assessment & Plan:   Cervical diskitis, epidural abscess, mediastinal abscess due to esophageal perforation. Repeat MRI ESR, CRP and clinical picture are encouraging.  We gave her additional protracted cover with levofloxacin which she is now off of.  We already  recheck inflammatory markers today keeping in mind the fact that her rheumatoid arthritis could skew these numbers. And they were normal and on exam 2 months time afterwards she continues to improve. I think we have cured this infection   Hip dislocations recurrent: we worried this site could be infected but MRI did not sugges this  In October   IMPRESSION: No significant abnormality of the hips or pelvis. Slight muscle strain or contusion in the right gluteus medius felt to be due to the prior dislocation.   I AM COMFORTABLE FROM ID STANDPOINT WITH HER PROCEEDING WITH SURGERY    Wound infection: sp surgery and resolved  I spent greater than 25 minutes with the patient including greater than 50% of time in face to face counsel of the patient  her esophageal performation with mediastinal abscess, cervical diskitis, her RA and prosthetic hip dislocatoion and in coordination of their care.   Acey Lav, MD

## 2015-10-18 NOTE — Progress Notes (Signed)
10/16/2015-noted office note for pre-operative clearance fron Dr. Tommy Medal in [facility]. 10/12/2015-pre-operative clearance from Dr. Jacelyn Grip on chart.

## 2015-10-19 ENCOUNTER — Encounter (HOSPITAL_COMMUNITY): Payer: Self-pay

## 2015-10-19 NOTE — Progress Notes (Signed)
Patient notified of time change for surgery on Monday 10/22/2015. Patient instructed to arrive at Short Stay at [facility] at 1200 noon and may have clear liquids from midnight up until 0800 am then nothing after that time until after surgery!. Patient verbalized understanding.

## 2015-10-22 ENCOUNTER — Inpatient Hospital Stay (HOSPITAL_COMMUNITY)
Admission: RE | Admit: 2015-10-22 | Discharge: 2015-10-23 | DRG: 468 | Disposition: A | Discharge status: Home Health | Payer: Medicare Other | Source: Ambulatory Visit | Attending: Orthopedic Surgery | Admitting: Orthopedic Surgery

## 2015-10-22 ENCOUNTER — Inpatient Hospital Stay (HOSPITAL_COMMUNITY): Payer: Medicare Other | Admitting: Anesthesiology

## 2015-10-22 ENCOUNTER — Encounter (HOSPITAL_COMMUNITY)
Admission: RE | Disposition: A | Discharge status: Home Health | Payer: Self-pay | Source: Ambulatory Visit | Attending: Orthopedic Surgery

## 2015-10-22 ENCOUNTER — Encounter (HOSPITAL_COMMUNITY): Payer: Self-pay | Admitting: Anesthesiology

## 2015-10-22 ENCOUNTER — Inpatient Hospital Stay (HOSPITAL_COMMUNITY): Payer: Medicare Other

## 2015-10-22 DIAGNOSIS — Z96649 Presence of unspecified artificial hip joint: Secondary | ICD-10-CM

## 2015-10-22 DIAGNOSIS — J45909 Unspecified asthma, uncomplicated: Secondary | ICD-10-CM | POA: Diagnosis present

## 2015-10-22 DIAGNOSIS — Z9011 Acquired absence of right breast and nipple: Secondary | ICD-10-CM | POA: Diagnosis not present

## 2015-10-22 DIAGNOSIS — J449 Chronic obstructive pulmonary disease, unspecified: Secondary | ICD-10-CM | POA: Diagnosis not present

## 2015-10-22 DIAGNOSIS — F17210 Nicotine dependence, cigarettes, uncomplicated: Secondary | ICD-10-CM | POA: Diagnosis present

## 2015-10-22 DIAGNOSIS — Z01812 Encounter for preprocedural laboratory examination: Secondary | ICD-10-CM

## 2015-10-22 DIAGNOSIS — I252 Old myocardial infarction: Secondary | ICD-10-CM

## 2015-10-22 DIAGNOSIS — M25551 Pain in right hip: Secondary | ICD-10-CM | POA: Diagnosis not present

## 2015-10-22 DIAGNOSIS — I10 Essential (primary) hypertension: Secondary | ICD-10-CM | POA: Diagnosis present

## 2015-10-22 DIAGNOSIS — M25351 Other instability, right hip: Secondary | ICD-10-CM | POA: Diagnosis not present

## 2015-10-22 DIAGNOSIS — Z794 Long term (current) use of insulin: Secondary | ICD-10-CM

## 2015-10-22 DIAGNOSIS — Z96643 Presence of artificial hip joint, bilateral: Secondary | ICD-10-CM | POA: Diagnosis present

## 2015-10-22 DIAGNOSIS — Z981 Arthrodesis status: Secondary | ICD-10-CM

## 2015-10-22 DIAGNOSIS — Y792 Prosthetic and other implants, materials and accessory orthopedic devices associated with adverse incidents: Secondary | ICD-10-CM | POA: Diagnosis present

## 2015-10-22 DIAGNOSIS — Z96642 Presence of left artificial hip joint: Secondary | ICD-10-CM | POA: Diagnosis not present

## 2015-10-22 DIAGNOSIS — T8489XA Other specified complication of internal orthopedic prosthetic devices, implants and grafts, initial encounter: Secondary | ICD-10-CM | POA: Diagnosis not present

## 2015-10-22 DIAGNOSIS — Z471 Aftercare following joint replacement surgery: Secondary | ICD-10-CM | POA: Diagnosis not present

## 2015-10-22 DIAGNOSIS — T84021A Dislocation of internal left hip prosthesis, initial encounter: Secondary | ICD-10-CM | POA: Diagnosis not present

## 2015-10-22 DIAGNOSIS — E119 Type 2 diabetes mellitus without complications: Secondary | ICD-10-CM | POA: Diagnosis present

## 2015-10-22 DIAGNOSIS — T84020A Dislocation of internal right hip prosthesis, initial encounter: Secondary | ICD-10-CM | POA: Diagnosis not present

## 2015-10-22 DIAGNOSIS — Z96641 Presence of right artificial hip joint: Secondary | ICD-10-CM | POA: Diagnosis not present

## 2015-10-22 DIAGNOSIS — Z955 Presence of coronary angioplasty implant and graft: Secondary | ICD-10-CM | POA: Diagnosis not present

## 2015-10-22 HISTORY — PX: ANTERIOR HIP REVISION: SHX6527

## 2015-10-22 LAB — TYPE AND SCREEN
ABO/RH(D): O POS
Antibody Screen: NEGATIVE

## 2015-10-22 LAB — GLUCOSE, CAPILLARY: Glucose-Capillary: 121 mg/dL — ABNORMAL HIGH (ref 65–99)

## 2015-10-22 SURGERY — REVISION, TOTAL ARTHROPLASTY, HIP, ANTERIOR APPROACH
Anesthesia: General | Site: Hip | Laterality: Right

## 2015-10-22 MED ORDER — POTASSIUM CHLORIDE 2 MEQ/ML IV SOLN
100.0000 mL/h | INTRAVENOUS | Status: DC
  Administered 2015-10-23: 100 mL/h via INTRAVENOUS
  Filled 2015-10-22 (×4): qty 1000

## 2015-10-22 MED ORDER — BUDESONIDE-FORMOTEROL FUMARATE 160-4.5 MCG/ACT IN AERO
2.0000 | INHALATION_SPRAY | Freq: Two times a day (BID) | RESPIRATORY_TRACT | Status: DC | PRN
  Filled 2015-10-22: qty 6

## 2015-10-22 MED ORDER — DIPHENHYDRAMINE HCL 25 MG PO CAPS: 25.0000 mg | ORAL_CAPSULE | Freq: Four times a day (QID) | ORAL | Status: DC | PRN

## 2015-10-22 MED ORDER — MORPHINE SULFATE 15 MG PO TABS
30.0000 mg | ORAL_TABLET | Freq: Four times a day (QID) | ORAL | Status: DC
  Administered 2015-10-22 – 2015-10-23 (×4): 30 mg via ORAL
  Filled 2015-10-22 (×4): qty 2

## 2015-10-22 MED ORDER — LOSARTAN POTASSIUM 25 MG PO TABS
25.0000 mg | ORAL_TABLET | Freq: Every day | ORAL | Status: DC
  Filled 2015-10-22 (×2): qty 1

## 2015-10-22 MED ORDER — PHENYLEPHRINE 40 MCG/ML (10ML) SYRINGE FOR IV PUSH (FOR BLOOD PRESSURE SUPPORT)
PREFILLED_SYRINGE | INTRAVENOUS | Status: AC
  Filled 2015-10-22: qty 10

## 2015-10-22 MED ORDER — CHLORHEXIDINE GLUCONATE 4 % EX LIQD: 60.0000 mL | Freq: Once | CUTANEOUS | Status: DC

## 2015-10-22 MED ORDER — POLYETHYLENE GLYCOL 3350 17 G PO PACK: 17.0000 g | PACK | Freq: Two times a day (BID) | ORAL | Status: DC

## 2015-10-22 MED ORDER — 0.9 % SODIUM CHLORIDE (POUR BTL) OPTIME
TOPICAL | Status: DC | PRN
  Administered 2015-10-22: 1000 mL

## 2015-10-22 MED ORDER — ONDANSETRON HCL 4 MG/2ML IJ SOLN
INTRAMUSCULAR | Status: DC | PRN
  Administered 2015-10-22: 4 mg via INTRAVENOUS

## 2015-10-22 MED ORDER — HYDROMORPHONE HCL 1 MG/ML IJ SOLN
INTRAMUSCULAR | Status: AC
  Filled 2015-10-22: qty 1

## 2015-10-22 MED ORDER — OXYCODONE HCL 5 MG PO TABS
10.0000 mg | ORAL_TABLET | ORAL | Status: DC
  Administered 2015-10-22 – 2015-10-23 (×5): 20 mg via ORAL
  Filled 2015-10-22 (×5): qty 4

## 2015-10-22 MED ORDER — DEXAMETHASONE SODIUM PHOSPHATE 10 MG/ML IJ SOLN
INTRAMUSCULAR | Status: AC
  Filled 2015-10-22: qty 1

## 2015-10-22 MED ORDER — MENTHOL 3 MG MT LOZG: 1.0000 | LOZENGE | OROMUCOSAL | Status: DC | PRN

## 2015-10-22 MED ORDER — DEXAMETHASONE SODIUM PHOSPHATE 10 MG/ML IJ SOLN
10.0000 mg | Freq: Once | INTRAMUSCULAR | Status: DC
  Filled 2015-10-22: qty 1

## 2015-10-22 MED ORDER — LACTATED RINGERS IV SOLN: INTRAVENOUS | Status: DC

## 2015-10-22 MED ORDER — ONDANSETRON HCL 4 MG/2ML IJ SOLN
INTRAMUSCULAR | Status: AC
  Filled 2015-10-22: qty 2

## 2015-10-22 MED ORDER — METHOCARBAMOL 1000 MG/10ML IJ SOLN
500.0000 mg | Freq: Four times a day (QID) | INTRAMUSCULAR | Status: DC | PRN
  Administered 2015-10-22: 500 mg via INTRAVENOUS
  Filled 2015-10-22 (×2): qty 5

## 2015-10-22 MED ORDER — FENTANYL CITRATE (PF) 100 MCG/2ML IJ SOLN
INTRAMUSCULAR | Status: DC | PRN
  Administered 2015-10-22 (×2): 50 ug via INTRAVENOUS

## 2015-10-22 MED ORDER — GABAPENTIN 300 MG PO CAPS
600.0000 mg | ORAL_CAPSULE | Freq: Four times a day (QID) | ORAL | Status: DC
  Administered 2015-10-22 – 2015-10-23 (×4): 600 mg via ORAL
  Filled 2015-10-22 (×6): qty 2

## 2015-10-22 MED ORDER — MAGNESIUM CITRATE PO SOLN: 1.0000 | Freq: Once | ORAL | Status: DC | PRN

## 2015-10-22 MED ORDER — METOCLOPRAMIDE HCL 10 MG PO TABS: 5.0000 mg | ORAL_TABLET | Freq: Three times a day (TID) | ORAL | Status: DC | PRN

## 2015-10-22 MED ORDER — PHENOL 1.4 % MT LIQD
1.0000 | OROMUCOSAL | Status: DC | PRN
  Filled 2015-10-22: qty 177

## 2015-10-22 MED ORDER — CEFAZOLIN SODIUM-DEXTROSE 2-3 GM-% IV SOLR
2.0000 g | Freq: Four times a day (QID) | INTRAVENOUS | Status: AC
  Administered 2015-10-22 – 2015-10-23 (×2): 2 g via INTRAVENOUS
  Filled 2015-10-22 (×2): qty 50

## 2015-10-22 MED ORDER — CELECOXIB 200 MG PO CAPS
200.0000 mg | ORAL_CAPSULE | Freq: Two times a day (BID) | ORAL | Status: DC
  Administered 2015-10-23: 200 mg via ORAL
  Filled 2015-10-22 (×3): qty 1

## 2015-10-22 MED ORDER — LIDOCAINE HCL (CARDIAC) 20 MG/ML IV SOLN
INTRAVENOUS | Status: AC
  Filled 2015-10-22: qty 5

## 2015-10-22 MED ORDER — ALUM & MAG HYDROXIDE-SIMETH 200-200-20 MG/5ML PO SUSP: 30.0000 mL | ORAL | Status: DC | PRN

## 2015-10-22 MED ORDER — HYDROMORPHONE HCL 2 MG/ML IJ SOLN
INTRAMUSCULAR | Status: AC
  Filled 2015-10-22: qty 1

## 2015-10-22 MED ORDER — HYDROMORPHONE HCL 1 MG/ML IJ SOLN
INTRAMUSCULAR | Status: DC
Start: 2015-10-22 — End: 2015-10-22
  Filled 2015-10-22: qty 1

## 2015-10-22 MED ORDER — DEXAMETHASONE SODIUM PHOSPHATE 10 MG/ML IJ SOLN
10.0000 mg | Freq: Once | INTRAMUSCULAR | Status: AC
  Administered 2015-10-22: 10 mg via INTRAVENOUS

## 2015-10-22 MED ORDER — PHENYLEPHRINE HCL 10 MG/ML IJ SOLN
INTRAMUSCULAR | Status: DC | PRN
  Administered 2015-10-22 (×6): 80 ug via INTRAVENOUS

## 2015-10-22 MED ORDER — MIDAZOLAM HCL 2 MG/2ML IJ SOLN
INTRAMUSCULAR | Status: AC
  Filled 2015-10-22: qty 2

## 2015-10-22 MED ORDER — FUROSEMIDE 40 MG PO TABS
40.0000 mg | ORAL_TABLET | Freq: Every day | ORAL | Status: DC
  Filled 2015-10-22 (×2): qty 1

## 2015-10-22 MED ORDER — ONDANSETRON HCL 4 MG/2ML IJ SOLN
4.0000 mg | Freq: Four times a day (QID) | INTRAMUSCULAR | Status: DC | PRN
Start: 2015-10-22 — End: 2015-10-23

## 2015-10-22 MED ORDER — BISACODYL 10 MG RE SUPP: 10.0000 mg | Freq: Every day | RECTAL | Status: DC | PRN

## 2015-10-22 MED ORDER — MIDAZOLAM HCL 5 MG/5ML IJ SOLN
INTRAMUSCULAR | Status: DC | PRN
  Administered 2015-10-22: 2 mg via INTRAVENOUS

## 2015-10-22 MED ORDER — ROCURONIUM BROMIDE 100 MG/10ML IV SOLN
INTRAVENOUS | Status: DC | PRN
  Administered 2015-10-22: 35 mg via INTRAVENOUS
  Administered 2015-10-22: 15 mg via INTRAVENOUS

## 2015-10-22 MED ORDER — PROPOFOL 10 MG/ML IV BOLUS
INTRAVENOUS | Status: DC | PRN
  Administered 2015-10-22: 100 mg via INTRAVENOUS

## 2015-10-22 MED ORDER — ALPRAZOLAM 0.25 MG PO TABS: 0.2500 mg | ORAL_TABLET | Freq: Two times a day (BID) | ORAL | Status: DC | PRN

## 2015-10-22 MED ORDER — ONDANSETRON HCL 4 MG PO TABS: 4.0000 mg | ORAL_TABLET | Freq: Four times a day (QID) | ORAL | Status: DC | PRN

## 2015-10-22 MED ORDER — CEFAZOLIN SODIUM-DEXTROSE 2-3 GM-% IV SOLR
INTRAVENOUS | Status: AC
  Filled 2015-10-22: qty 50

## 2015-10-22 MED ORDER — SODIUM CHLORIDE 0.9 % IV SOLN
1000.0000 mg | Freq: Once | INTRAVENOUS | Status: AC
  Administered 2015-10-22: 1000 mg via INTRAVENOUS
  Filled 2015-10-22: qty 10

## 2015-10-22 MED ORDER — SUGAMMADEX SODIUM 200 MG/2ML IV SOLN
INTRAVENOUS | Status: AC
  Filled 2015-10-22: qty 2

## 2015-10-22 MED ORDER — BUDESONIDE-FORMOTEROL FUMARATE 160-4.5 MCG/ACT IN AERO: 2.0000 | INHALATION_SPRAY | Freq: Two times a day (BID) | RESPIRATORY_TRACT | Status: DC | PRN

## 2015-10-22 MED ORDER — HYDROMORPHONE HCL 1 MG/ML IJ SOLN
0.2500 mg | INTRAMUSCULAR | Status: DC | PRN
  Administered 2015-10-22 (×5): 0.5 mg via INTRAVENOUS

## 2015-10-22 MED ORDER — ALBUTEROL SULFATE (2.5 MG/3ML) 0.083% IN NEBU
2.5000 mg | INHALATION_SOLUTION | Freq: Four times a day (QID) | RESPIRATORY_TRACT | Status: DC | PRN
Start: 2015-10-22 — End: 2015-10-23

## 2015-10-22 MED ORDER — METOCLOPRAMIDE HCL 5 MG/ML IJ SOLN: 5.0000 mg | Freq: Three times a day (TID) | INTRAMUSCULAR | Status: DC | PRN

## 2015-10-22 MED ORDER — STERILE WATER FOR IRRIGATION IR SOLN
Status: DC | PRN
  Administered 2015-10-22: 1

## 2015-10-22 MED ORDER — DOCUSATE SODIUM 100 MG PO CAPS: 100.0000 mg | ORAL_CAPSULE | Freq: Two times a day (BID) | ORAL | Status: DC

## 2015-10-22 MED ORDER — FENTANYL CITRATE (PF) 100 MCG/2ML IJ SOLN
INTRAMUSCULAR | Status: AC
  Filled 2015-10-22: qty 2

## 2015-10-22 MED ORDER — NICOTINE 21 MG/24HR TD PT24
21.0000 mg | MEDICATED_PATCH | Freq: Every day | TRANSDERMAL | Status: DC
  Administered 2015-10-22 – 2015-10-23 (×2): 21 mg via TRANSDERMAL
  Filled 2015-10-22 (×2): qty 1

## 2015-10-22 MED ORDER — CEFAZOLIN SODIUM-DEXTROSE 2-3 GM-% IV SOLR
2.0000 g | INTRAVENOUS | Status: AC
  Administered 2015-10-22: 2 g via INTRAVENOUS

## 2015-10-22 MED ORDER — METHOCARBAMOL 500 MG PO TABS: 500.0000 mg | ORAL_TABLET | Freq: Four times a day (QID) | ORAL | Status: DC | PRN

## 2015-10-22 MED ORDER — LIDOCAINE HCL (CARDIAC) 20 MG/ML IV SOLN
INTRAVENOUS | Status: DC | PRN
  Administered 2015-10-22: 50 mg via INTRAVENOUS

## 2015-10-22 MED ORDER — HYDROMORPHONE HCL 1 MG/ML IJ SOLN
INTRAMUSCULAR | Status: DC | PRN
  Administered 2015-10-22 (×2): .4 mg via INTRAVENOUS
  Administered 2015-10-22: .3 mg via INTRAVENOUS
  Administered 2015-10-22: .4 mg via INTRAVENOUS

## 2015-10-22 MED ORDER — HYDROMORPHONE HCL 1 MG/ML IJ SOLN
0.5000 mg | INTRAMUSCULAR | Status: DC | PRN
  Administered 2015-10-22: 1 mg via INTRAVENOUS
  Filled 2015-10-22: qty 1

## 2015-10-22 MED ORDER — ASPIRIN EC 325 MG PO TBEC
325.0000 mg | DELAYED_RELEASE_TABLET | Freq: Two times a day (BID) | ORAL | Status: DC
  Administered 2015-10-23: 325 mg via ORAL
  Filled 2015-10-22 (×3): qty 1

## 2015-10-22 MED ORDER — HYDROMORPHONE HCL 1 MG/ML IJ SOLN
INTRAMUSCULAR | Status: AC
  Administered 2015-10-22: 0.5 mg
  Filled 2015-10-22: qty 1

## 2015-10-22 MED ORDER — FERROUS SULFATE 325 (65 FE) MG PO TABS
325.0000 mg | ORAL_TABLET | Freq: Three times a day (TID) | ORAL | Status: DC
  Administered 2015-10-23 (×2): 325 mg via ORAL
  Filled 2015-10-22 (×5): qty 1

## 2015-10-22 MED ORDER — SUGAMMADEX SODIUM 200 MG/2ML IV SOLN
INTRAVENOUS | Status: DC | PRN
  Administered 2015-10-22: 121.6 mg via INTRAVENOUS

## 2015-10-22 MED ORDER — LACTATED RINGERS IV SOLN
INTRAVENOUS | Status: DC | PRN
  Administered 2015-10-22 (×2): via INTRAVENOUS

## 2015-10-22 MED ORDER — ALBUTEROL SULFATE HFA 108 (90 BASE) MCG/ACT IN AERS: 2.0000 | INHALATION_SPRAY | Freq: Four times a day (QID) | RESPIRATORY_TRACT | Status: DC | PRN

## 2015-10-22 SURGICAL SUPPLY — 59 items
BAG DECANTER FOR FLEXI CONT (MISCELLANEOUS) ×3 IMPLANT
BAG SPEC THK2 15X12 ZIP CLS (MISCELLANEOUS) ×1
BAG ZIPLOCK 12X15 (MISCELLANEOUS) ×3 IMPLANT
BALL HIP ARTICU EZE 36 8.5 (Hips) IMPLANT
BLADE SAW SGTL 18X1.27X75 (BLADE) ×2 IMPLANT
BLADE SAW SGTL 18X1.27X75MM (BLADE) ×1
BRUSH FEMORAL CANAL (MISCELLANEOUS) IMPLANT
DRAPE INCISE IOBAN 85X60 (DRAPES) ×3 IMPLANT
DRAPE ORTHO SPLIT 77X108 STRL (DRAPES) ×6
DRAPE POUCH INSTRU U-SHP 10X18 (DRAPES) ×3 IMPLANT
DRAPE SURG 17X11 SM STRL (DRAPES) ×3 IMPLANT
DRAPE SURG ORHT 6 SPLT 77X108 (DRAPES) ×2 IMPLANT
DRAPE U-SHAPE 47X51 STRL (DRAPES) ×3 IMPLANT
DRSG AQUACEL AG ADV 3.5X10 (GAUZE/BANDAGES/DRESSINGS) ×2 IMPLANT
DRSG AQUACEL AG ADV 3.5X14 (GAUZE/BANDAGES/DRESSINGS) IMPLANT
DURAPREP 26ML APPLICATOR (WOUND CARE) ×3 IMPLANT
ELECT BLADE TIP CTD 4 INCH (ELECTRODE) ×3 IMPLANT
ELECT REM PT RETURN 9FT ADLT (ELECTROSURGICAL) ×3
ELECTRODE REM PT RTRN 9FT ADLT (ELECTROSURGICAL) ×1 IMPLANT
FACESHIELD WRAPAROUND (MASK) ×12 IMPLANT
FACESHIELD WRAPAROUND OR TEAM (MASK) ×4 IMPLANT
GAUZE SPONGE 2X2 8PLY STRL LF (GAUZE/BANDAGES/DRESSINGS) ×1 IMPLANT
GLOVE BIOGEL M 7.0 STRL (GLOVE) IMPLANT
GLOVE BIOGEL PI IND STRL 7.5 (GLOVE) ×1 IMPLANT
GLOVE BIOGEL PI IND STRL 8.5 (GLOVE) ×1 IMPLANT
GLOVE BIOGEL PI INDICATOR 7.5 (GLOVE) ×2
GLOVE BIOGEL PI INDICATOR 8.5 (GLOVE) ×2
GLOVE ECLIPSE 8.0 STRL XLNG CF (GLOVE) ×6 IMPLANT
GOWN STRL REUS W/TWL LRG LVL3 (GOWN DISPOSABLE) ×3 IMPLANT
GOWN STRL REUS W/TWL XL LVL3 (GOWN DISPOSABLE) ×6 IMPLANT
HEAD FEM BIOLOX DELTA 36 8.5 (Orthopedic Implant) ×2 IMPLANT
HIP BALL ARTICU EZE 36 8.5 (Hips) ×3 IMPLANT
LIQUID BAND (GAUZE/BANDAGES/DRESSINGS) ×3 IMPLANT
MANIFOLD NEPTUNE II (INSTRUMENTS) ×3 IMPLANT
MARKER SKIN DUAL TIP RULER LAB (MISCELLANEOUS) ×3 IMPLANT
NDL SAFETY ECLIPSE 18X1.5 (NEEDLE) ×1 IMPLANT
NEEDLE HYPO 18GX1.5 SHARP (NEEDLE) ×3
NS IRRIG 1000ML POUR BTL (IV SOLUTION) ×6 IMPLANT
PADDING CAST COTTON 6X4 STRL (CAST SUPPLIES) ×3 IMPLANT
PIN ESC CONSTR ACE LINER 36X56 ×4 IMPLANT
POSITIONER SURGICAL ARM (MISCELLANEOUS) ×3 IMPLANT
PRESSURIZER FEMORAL UNIV (MISCELLANEOUS) IMPLANT
SPONGE GAUZE 2X2 STER 10/PKG (GAUZE/BANDAGES/DRESSINGS) ×2
SPONGE LAP 18X18 X RAY DECT (DISPOSABLE) ×1 IMPLANT
SPONGE LAP 4X18 X RAY DECT (DISPOSABLE) ×1 IMPLANT
STAPLER VISISTAT 35W (STAPLE) ×3 IMPLANT
SUCTION FRAZIER HANDLE 10FR (MISCELLANEOUS) ×2
SUCTION TUBE FRAZIER 10FR DISP (MISCELLANEOUS) ×1 IMPLANT
SUT MNCRL AB 3-0 PS2 18 (SUTURE) ×2 IMPLANT
SUT VIC AB 1 CT1 36 (SUTURE) ×3 IMPLANT
SUT VIC AB 2-0 CT1 27 (SUTURE) ×9
SUT VIC AB 2-0 CT1 TAPERPNT 27 (SUTURE) ×3 IMPLANT
SUT VLOC 180 0 24IN GS25 (SUTURE) ×6 IMPLANT
TOWEL OR 17X26 10 PK STRL BLUE (TOWEL DISPOSABLE) ×6 IMPLANT
TOWER CARTRIDGE SMART MIX (DISPOSABLE) IMPLANT
TRAY FOLEY W/METER SILVER 14FR (SET/KITS/TRAYS/PACK) ×3 IMPLANT
TUBE KAMVAC SUCTION (TUBING) IMPLANT
WATER STERILE IRR 1500ML POUR (IV SOLUTION) ×3 IMPLANT
YANKAUER SUCT BULB TIP 10FT TU (MISCELLANEOUS) ×3 IMPLANT

## 2015-10-22 NOTE — Anesthesia Procedure Notes (Signed)
Procedure Name: Intubation Date/Time: 10/22/2015 1:37 PM Performed by: Zaylie Gisler, Virgel Gess Pre-anesthesia Checklist: Patient identified, Emergency Drugs available, Suction available, Patient being monitored and Timeout performed Patient Re-evaluated:Patient Re-evaluated prior to inductionOxygen Delivery Method: Circle system utilized Preoxygenation: Pre-oxygenation with 100% oxygen Intubation Type: IV induction Ventilation: Mask ventilation without difficulty Laryngoscope Size: Mac and 3 Grade View: Grade I Tube type: Oral Tube size: 7.5 mm Number of attempts: 1 Airway Equipment and Method: Stylet Placement Confirmation: ETT inserted through vocal cords under direct vision,  positive ETCO2,  CO2 detector and breath sounds checked- equal and bilateral Secured at: 20 cm Tube secured with: Tape Dental Injury: Teeth and Oropharynx as per pre-operative assessment

## 2015-10-22 NOTE — Transfer of Care (Signed)
Immediate Anesthesia Transfer of Care Note  Patient: [name]  Procedure(s) Performed: Procedure(s): RIGHT  HIP REVISION (Right)  Patient Location: PACU  Anesthesia Type:General  Level of Consciousness:  sedated, patient cooperative and responds to stimulation  Airway & Oxygen Therapy:Patient Spontanous Breathing and Patient connected to face mask oxgen  Post-op Assessment:  Report given to PACU RN and Post -op Vital signs reviewed and stable  Post vital signs:  Reviewed and stable  Last Vitals:  Filed Vitals:   10/22/15 1206  BP: 148/77  Pulse: 90  Temp: 36.6 C  Resp: 18    Complications: No apparent anesthesia complications

## 2015-10-22 NOTE — Anesthesia Postprocedure Evaluation (Signed)
Anesthesia Post Note  Patient: [name]  Procedure(s) Performed: Procedure(s) (LRB): RIGHT  HIP REVISION (Right)  Patient location during evaluation: PACU Anesthesia Type: General Level of consciousness: awake and alert Pain management: pain level controlled Vital Signs Assessment: post-procedure vital signs reviewed and stable Respiratory status: spontaneous breathing, nonlabored ventilation, respiratory function stable and patient connected to nasal cannula oxygen Cardiovascular status: blood pressure returned to baseline and stable Postop Assessment: no signs of nausea or vomiting Anesthetic complications: no    Last Vitals:  Filed Vitals:   10/22/15 1625 10/22/15 1630  BP:  135/88  Pulse: 81 81  Temp:    Resp: 22 24    Last Pain:  Filed Vitals:   10/22/15 1634  PainSc: 10-Worst pain ever                 Rollin Kotowski L

## 2015-10-22 NOTE — Anesthesia Preprocedure Evaluation (Addendum)
Anesthesia Evaluation  Patient identified by MRN, date of birth, ID band Patient awake    Reviewed: Allergy & Precautions, NPO status , Patient's Chart, lab work & pertinent test results  Airway Mallampati: II  TM Distance: >3 FB Neck ROM: Limited    Dental no notable dental hx. (+) Dental Advisory Given, Lower Dentures, Upper Dentures   Pulmonary asthma , COPD,  COPD inhaler, Current Smoker,  History respiratory failure   Pulmonary exam normal breath sounds clear to auscultation       Cardiovascular Exercise Tolerance: Poor hypertension, Pt. on medications (-) angina+ CAD, + Past MI and + Cardiac Stents  Normal cardiovascular exam+ dysrhythmias Atrial Fibrillation  Rhythm:Regular Rate:Normal     Neuro/Psych History encephalopathy negative neurological ROS  negative psych ROS   GI/Hepatic negative GI ROS, (+)     substance abuse  alcohol use, Claims no alcohol in several months   Endo/Other  diabetes, Well Controlled, Type 2  Renal/GU negative Renal ROS  negative genitourinary   Musculoskeletal  (+) Arthritis , Osteoarthritis,    Abdominal   Peds negative pediatric ROS (+)  Hematology negative hematology ROS (+)   Anesthesia Other Findings   Reproductive/Obstetrics negative OB ROS                            Anesthesia Physical Anesthesia Plan  ASA: III  Anesthesia Plan: General   Post-op Pain Management:    Induction: Intravenous  Airway Management Planned: Oral ETT  Additional Equipment:   Intra-op Plan:   Post-operative Plan: Extubation in OR  Informed Consent:   Plan Discussed with: Surgeon  Anesthesia Plan Comments:        Anesthesia Quick Evaluation

## 2015-10-22 NOTE — Progress Notes (Signed)
Utilization review completed.  

## 2015-10-22 NOTE — Interval H&P Note (Signed)
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 After consideration of risks, benefits and other options for treatment, the patient has consented to  Procedure(s): RIGHT  HIP REVISION (Right) as a surgical intervention .  The patient's history has been reviewed, patient examined, no change in status, stable for surgery.  I have reviewed the patient's chart and labs.  Questions were answered to the patient's satisfaction.     Mauri Pole

## 2015-10-23 LAB — BASIC METABOLIC PANEL
ANION GAP: 5 (ref 5–15)
BUN: 18 mg/dL (ref 6–20)
CALCIUM: 8.7 mg/dL — AB (ref 8.9–10.3)
CO2: 25 mmol/L (ref 22–32)
Chloride: 105 mmol/L (ref 101–111)
Creatinine, Ser: 0.45 mg/dL (ref 0.44–1.00)
GLUCOSE: 208 mg/dL — AB (ref 65–99)
POTASSIUM: 4.6 mmol/L (ref 3.5–5.1)
Sodium: 135 mmol/L (ref 135–145)

## 2015-10-23 LAB — CBC
HEMATOCRIT: 32.7 % — AB (ref 36.0–46.0)
Hemoglobin: 11.1 g/dL — ABNORMAL LOW (ref 12.0–15.0)
MCH: 29.7 pg (ref 26.0–34.0)
MCHC: 33.9 g/dL (ref 30.0–36.0)
MCV: 87.4 fL (ref 78.0–100.0)
PLATELETS: 233 10*3/uL (ref 150–400)
RBC: 3.74 MIL/uL — AB (ref 3.87–5.11)
RDW: 14.2 % (ref 11.5–15.5)
WBC: 12 10*3/uL — AB (ref 4.0–10.5)

## 2015-10-23 MED ORDER — FERROUS SULFATE 325 (65 FE) MG PO TABS: 325.0000 mg | ORAL_TABLET | Freq: Three times a day (TID) | ORAL | Status: DC

## 2015-10-23 MED ORDER — ASPIRIN 325 MG PO TBEC: 325.0000 mg | DELAYED_RELEASE_TABLET | Freq: Two times a day (BID) | ORAL | Status: AC

## 2015-10-23 MED ORDER — DOCUSATE SODIUM 100 MG PO CAPS: 100.0000 mg | ORAL_CAPSULE | Freq: Two times a day (BID) | ORAL | Status: DC

## 2015-10-23 MED ORDER — METHOCARBAMOL 500 MG PO TABS: 500.0000 mg | ORAL_TABLET | Freq: Four times a day (QID) | ORAL | Status: DC | PRN

## 2015-10-23 MED ORDER — OXYCODONE HCL 10 MG PO TABS: 10.0000 mg | ORAL_TABLET | ORAL | Status: DC | PRN

## 2015-10-23 MED ORDER — POLYETHYLENE GLYCOL 3350 17 G PO PACK: 17.0000 g | PACK | Freq: Two times a day (BID) | ORAL | Status: DC

## 2015-10-23 NOTE — Op Note (Signed)
[name], [name]                ACCOUNT NO.:  [id number]  MEDICAL RECORD NO.:  [id number]  LOCATION:  1609                         FACILITY:  [facility]  PHYSICIAN:  Madlyn Frankel. Charlann Boxer, M.D.  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  PROCEDURE:  Revision right total hip arthroplasty to a constrained liner.  COMPONENTS USE:  A DePuy constrained liner system with a 56 outer diameter constrained liner with a 36 inner diameter surface with a 36+ 5 metal ball.  SURGEON:  Madlyn Frankel. Charlann Boxer, M.D.  ASSISTANT:  Lanney Gins, PA-C.  Noted that Mr. [name] was present for the entirety of the case from preoperative positioning, perioperative management of the operative extremity, general facilitation of the case, and primary wound closure.  ANESTHESIA:  General.  SPECIMENS:  None.  COMPLICATION:  None.  BLOOD LOSS:  About 100 mL.  INDICATIONS FOR PROCEDURE:  [name] is a 70 year old female, patient of mine with previous total hip arthroplasty.  Her postoperative course has been marked by multiple complicating features including recurrent instability at various times.  She had medically comorbid condition involving her cervical spine that required cervical spine fusion and was complicated by infection, which had been cleared.  There was concerned about potential myelopathic change and her muscular strength could lead some of her instability issues.  Nonetheless, based on the recurrent episodes of dislocation, she wished to have dressing in more permanent fashion.  I discussed with her the options of constrained liner versus a revision of the acetabulum to better assumed position.  After reviewing this, I told her that the  decision will be made in the operating room based on her muscular strength, soft tissues, as well as orientation of components on what I would plan to do.  Consent was obtained for improved pain control as well as improved stability.  PROCEDURE IN DETAIL:  The patient was brought to the operative theater. Once adequate anesthesia, preoperative antibiotics, Ancef, 1 g of tranexamic acid, and 10 mg of Decadron administered, she was positioned in the left lateral decubitus position with right side up.  The right lower extremity was then prepped and draped in sterile fashion.  Time- out was performed identifying the patient, planned procedure, and extremity.  Her old incision was identified and demarcated.  Her skin incision was excised.  Soft tissue dissection was carried to the iliotibial band and gluteal fascia.  This was then incised for posterior approach to the hip and the posterior aspect of the hip exposed, first identified very weak and atrophy gluteus maximus.  Her soft tissues were extremely laxed. Once I had the posterior aspect of the hip exposed, I evaluated the range of motion and found that she tolerated extension and external rotation.  I found that she had some evidence of impingement in soft tissues with forward flexion to 90 degrees and internal rotation.  The hip was then dislocated.  Femoral head was removed.  I was easily  able to place the neck trunnion onto the ilium to allow for exposure of the acetabulum.  Acetabular rim was exposed and debrided.  I then used 5- inch curved osteotome and removed, anterior wall osteophyte.  Once this was done and the rim satisfactorily visualized, I irrigated the cup out. We then opened up the first of the constrained liner.  Please note that one constrained liner was burned during the case related to the fact that upon placement of the ring that the rim portions broke a bit, I then removed one liner and placed the second liner.   The second liner was placed with good visualized, intact, and secure fixation.  The metal ring was then positioned over the trunnion and a 36+ 5 metal ball impacted on the clean and dry trunnion.  The hip was reduced with good reduction sound and audible.  The ring for the constrained liner was then placed in position and was fairly easily snapped into place.  At this point, the hip was checked for range of motion.  There was no evidence of any impingement or subluxation.  The hip was irrigated again.  We then reapproximated the iliotibial band and gluteal fascia using #1 Vicryl and 0 V-Loc sutures.  The remainder of the wound was closed with 2-0 Vicryl and running 3-0 Monocryl.  The hip was then cleaned, dried, and dressed sterilely.  She was then brought to the recovery room in stable condition and tolerating the procedure well. Findings were reviewed with her husband.  She will be weightbearing as tolerated and no knee immobilizer.     Madlyn Frankel Charlann Boxer, M.D.     MDO/MEDQ  D:  10/22/2015  T:  10/23/2015  Job:  [id number]

## 2015-10-23 NOTE — Care Management Note (Signed)
Case Management Note  Patient Details  Name: [name] MRN: [id number] Date of Birth: 1946/04/09  Subjective/Objective:                  RIGHT HIP REVISION (Right) Action/Plan: Discharge planning Expected Discharge Date:  10/23/15               Expected Discharge Plan:  [place]  In-House Referral:     Discharge planning Services  CM Consult  Post Acute Care Choice:    Choice offered to:  Patient  DME Arranged:  N/A DME Agency:  NA  HH Arranged:  PT [place] Agency:  [place]  Status of Service:  Completed, signed off  Medicare Important Message Given:    Date Medicare IM Given:    Medicare IM give by:    Date Additional Medicare IM Given:    Additional Medicare Important Message give by:     If discussed at [place] of Stay Meetings, dates discussed:    Additional Comments: CM met with pt to offer choice of home health agency.  Pt chooses [facility] and has requested Juliann Pulse.  Pt has both 3n1 and rolling walker at home and does not need any other DME.  Referral called to [facility] DME rep, Santiago Glad.  No other CM needs were communicated. Dellie Catholic, RN 10/23/2015, 11:21 AM

## 2015-10-23 NOTE — Progress Notes (Signed)
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 All OT education complete and patient denies need for further OT. OT will sign off.    Follow Up Recommendations  No OT follow up;Supervision/Assistance - 24 hour    Equipment Recommendations  None recommended by OT    Recommendations for Other Services       Precautions / Restrictions Precautions Precautions: Posterior Hip Precaution Comments: pt reports being familar with posterior hip precautions however reviewed with pt Restrictions Weight Bearing Restrictions: Yes Other Position/Activity Restrictions: WBAT      Mobility Bed Mobility                Transfers                 Balance                                            ADL Overall ADL's : Needs assistance/impaired Eating/Feeding: Independent;Sitting   Grooming: Set up;Sitting   Upper Body Bathing: Set up;Sitting   Lower Body Bathing: Minimal assistance;Sit to/from stand;Adhering to hip precautions   Upper Body Dressing : Set up;Sitting   Lower Body Dressing: Moderate assistance;Adhering to hip precautions;Sit to/from stand                 General ADL Comments: Patient up in chair upon arrival, reports she is familiar with her precautions and ADL techniques, yet reports that she has dislocated multiple times. Dismissive of OT services. OT educated her on posterior hip precautions and patient declined handout. She reports having all LB AE except shoe horn and states her husband can assist with shoes. She reports having elevated  toilets in 2 bathrooms of her home and a walk-in shower with a seat. She declined to practice ADL transfers during session. Verbal education of walk-in shower transfer technique. No further OT needs and patient denies need for OT at this time. OT will sign off.     Vision     Perception     Praxis      Pertinent Vitals/Pain Pain Assessment: 0-10 Pain Score: 3  Pain Location: R hip and back Pain Descriptors / Indicators: Aching;Sore Pain Intervention(s): Limited activity within patient's tolerance;Monitored during session     Hand Dominance Right   Extremity/Trunk Assessment Upper Extremity Assessment Upper Extremity Assessment: Overall WFL for tasks assessed   Lower Extremity Assessment Lower Extremity Assessment: Defer to PT evaluation       Communication Communication Communication: No difficulties   Cognition Arousal/Alertness: Awake/alert Behavior During Therapy: WFL for tasks assessed/performed Overall Cognitive Status: Within Functional Limits for tasks assessed                     General Comments       Exercises       Shoulder Instructions      Home Living Family/patient expects to be discharged to:: Private residence Living Arrangements: Spouse/significant other Available Help at Discharge: Family;Available 24 hours/day Type of Home: House  Home Access: Level entry     Home Layout: One level     Bathroom Shower/Tub: Producer, television/film/video: Handicapped height     Home Equipment: Environmental consultant - 2 wheels;Cane - single point;Bedside commode;Shower seat - built Designer, fashion/clothing: Reacher;Sock aid;Long-handled sponge Additional Comments: pt reports she does not have shoe horn but her husband can assist her with donning shoes while she has posterior hip precautions      Prior Functioning/Environment Level of Independence: Independent with assistive device(s)  Gait / Transfers Assistance Needed: assistive device  as needed ADL's / Homemaking Assistance Needed: assist from husband with cooking, laundry        OT Diagnosis: Acute pain   OT Problem List: Decreased activity tolerance;Decreased range of motion;Decreased knowledge of use of DME or AE;Decreased knowledge of precautions;Pain   OT Treatment/Interventions:      OT Goals(Current goals can be found in the care plan section) Acute Rehab OT Goals Patient Stated Goal: to go home today OT Goal Formulation: All assessment and education complete, DC therapy  OT Frequency:     Barriers to D/C:            Co-evaluation              End of Session    Activity Tolerance: Patient tolerated treatment well Patient left: in chair;with call bell/phone within reach;with nursing/sitter in room   Time: 1059-1111 OT Time Calculation (min): 12 min Charges:  OT General Charges $OT Visit: 1 Procedure OT Evaluation $OT Eval Low Complexity: 1 Procedure G-Codes:    Yu Peggs A October 28, 2015, 12:17 PM

## 2015-10-23 NOTE — Evaluation (Signed)
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 Patient evaluated by Physical Therapy with no further acute PT needs identified. All education has been completed and the patient has no further questions.  See below for any follow-up Physical Therapy or equipment needs. PT is signing off. Thank you for this referral.        Follow Up Recommendations Outpatient PT    Equipment Recommendations  None recommended by PT    Recommendations for Other Services       Precautions / Restrictions Precautions Precautions: Posterior Hip Precaution Comments: pt reports being familar with posterior hip precautions however reviewed with pt Restrictions Weight Bearing Restrictions: Yes Other Position/Activity Restrictions: WBAT      Mobility  Bed Mobility Overal bed mobility: Modified Independent             General bed mobility comments: pt able to self assist R LE, cues for posterior hip precautions  Transfers Overall transfer level: Needs assistance Equipment used: Rolling walker (2 wheeled) Transfers: Sit to/from Stand Sit to Stand: Min guard;Supervision         General transfer comment: cues for posterior hip precautions  Ambulation/Gait Ambulation/Gait assistance: Min guard;Supervision Ambulation Distance (Feet): 400 Feet Assistive device: Rolling walker (2 wheeled) Gait Pattern/deviations: Step-through pattern;Decreased stride length;Antalgic     General Gait Details: verbal cues for precautions and RW positioning, pt tends to keep RW slighty ahead despite  this  Stairs            Wheelchair Mobility    Modified Rankin (Stroke Patients Only)       Balance                                             Pertinent Vitals/Pain Pain Assessment: 0-10 Pain Score: 4  Pain Location: R hip and back Pain Descriptors / Indicators: Sore Pain Intervention(s): Limited activity within patient's tolerance;Monitored during session;Repositioned (kpad applied to back, pt declines ice to hip)    Home Living Family/patient expects to be discharged to:: Private residence Living Arrangements: Spouse/significant other Available Help at Discharge: Family;Available 24 hours/day Type of Home: House Home Access: Level entry     Home Layout: One level Home Equipment: Walker - 2 wheels;Bedside commode;Adaptive equipment;Cane - single point      Prior Function Level of Independence: Needs assistance   Gait / Transfers Assistance Needed: assistive device as needed           Hand Dominance        Extremity/Trunk Assessment               Lower Extremity Assessment: RLE deficits/detail RLE Deficits / Details: functional hip weakness observed    Cervical / Trunk Assessment: Other exceptions  Communication   Communication: No difficulties  Cognition Arousal/Alertness: Awake/alert Behavior During Therapy: WFL for tasks assessed/performed Overall Cognitive Status: Within Functional Limits for tasks assessed  Physical Therapy Evaluation Patient Details Name: [name] MRN: [id number] DOB: 1946/09/10 Today's Date: 10/23/2015   History of Present Illness  Pt is a 70 year old female s/p 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 Patient evaluated by Physical Therapy with no further acute PT needs identified. All education has been completed and the patient has no further questions.  See below for any follow-up Physical Therapy or equipment needs. PT is signing off. Thank you for this referral.        Follow Up Recommendations Outpatient PT    Equipment Recommendations  None recommended by PT    Recommendations for Other Services       Precautions / Restrictions Precautions Precautions: Posterior Hip Precaution Comments: pt reports being familar with posterior hip precautions however reviewed with pt Restrictions Weight Bearing Restrictions: Yes Other Position/Activity Restrictions: WBAT      Mobility  Bed Mobility Overal bed mobility: Modified Independent             General bed mobility comments: pt able to self assist R LE, cues for posterior hip precautions  Transfers Overall transfer level: Needs assistance Equipment used: Rolling walker (2 wheeled) Transfers: Sit to/from Stand Sit to Stand: Min guard;Supervision         General transfer comment: cues for posterior hip precautions  Ambulation/Gait Ambulation/Gait assistance: Min guard;Supervision Ambulation Distance (Feet): 400 Feet Assistive device: Rolling walker (2 wheeled) Gait Pattern/deviations: Step-through pattern;Decreased stride length;Antalgic     General Gait Details: verbal cues for precautions and RW positioning, pt tends to keep RW slighty ahead despite  this  Stairs            Wheelchair Mobility    Modified Rankin (Stroke Patients Only)       Balance                                             Pertinent Vitals/Pain Pain Assessment: 0-10 Pain Score: 4  Pain Location: R hip and back Pain Descriptors / Indicators: Sore Pain Intervention(s): Limited activity within patient's tolerance;Monitored during session;Repositioned (kpad applied to back, pt declines ice to hip)    Home Living Family/patient expects to be discharged to:: Private residence Living Arrangements: Spouse/significant other Available Help at Discharge: Family;Available 24 hours/day Type of Home: House Home Access: Level entry     Home Layout: One level Home Equipment: Walker - 2 wheels;Bedside commode;Adaptive equipment;Cane - single point      Prior Function Level of Independence: Needs assistance   Gait / Transfers Assistance Needed: assistive device as needed           Hand Dominance        Extremity/Trunk Assessment               Lower Extremity Assessment: RLE deficits/detail RLE Deficits / Details: functional hip weakness observed    Cervical / Trunk Assessment: Other exceptions  Communication   Communication: No difficulties  Cognition Arousal/Alertness: Awake/alert Behavior During Therapy: WFL for tasks assessed/performed Overall Cognitive Status: Within Functional Limits for tasks assessed

## 2015-10-23 NOTE — Progress Notes (Signed)
Patient ID: [name], female   DOB: June 02, 1946, 70 y.o.   MRN: [id number] Subjective: 1 Day Post-Op Procedure(s) (LRB): RIGHT  HIP REVISION (Right)    Patient reports pain as mild.  Doing well.  No problems or events.  Objective:   VITALS:   Filed Vitals:   10/23/15 0946 10/23/15 1347  BP: 128/54 133/59  Pulse: 56 56  Temp: 98.5 F (36.9 C) 98.5 F (36.9 C)  Resp: 16 18    Neurovascular intact Incision: dressing C/D/I  LABS  Recent Labs  10/23/15 0441  HGB 11.1*  HCT 32.7*  WBC 12.0*  PLT 233     Recent Labs  10/23/15 0441  NA 135  K 4.6  BUN 18  CREATININE 0.45  GLUCOSE 208*    No results for input(s): LABPT, INR in the last 72 hours.   Assessment/Plan: 1 Day Post-Op Procedure(s) (LRB): RIGHT  HIP REVISION (Right)   Advance diet Up with therapy   Plan to be discharged to home today after some therapy Reviewed intra-operative findings with regards to attenuation of muscle as pertains to procedure performed

## 2015-10-24 ENCOUNTER — Ambulatory Visit: Payer: Medicare Other | Admitting: Infectious Disease

## 2015-10-24 DIAGNOSIS — Z853 Personal history of malignant neoplasm of breast: Secondary | ICD-10-CM | POA: Diagnosis not present

## 2015-10-24 DIAGNOSIS — E785 Hyperlipidemia, unspecified: Secondary | ICD-10-CM | POA: Diagnosis not present

## 2015-10-24 DIAGNOSIS — E119 Type 2 diabetes mellitus without complications: Secondary | ICD-10-CM | POA: Diagnosis not present

## 2015-10-24 DIAGNOSIS — I2510 Atherosclerotic heart disease of native coronary artery without angina pectoris: Secondary | ICD-10-CM | POA: Diagnosis not present

## 2015-10-24 DIAGNOSIS — J45909 Unspecified asthma, uncomplicated: Secondary | ICD-10-CM | POA: Diagnosis not present

## 2015-10-24 DIAGNOSIS — I4891 Unspecified atrial fibrillation: Secondary | ICD-10-CM | POA: Diagnosis not present

## 2015-10-24 DIAGNOSIS — Z720 Tobacco use: Secondary | ICD-10-CM | POA: Diagnosis not present

## 2015-10-24 DIAGNOSIS — J449 Chronic obstructive pulmonary disease, unspecified: Secondary | ICD-10-CM | POA: Diagnosis not present

## 2015-10-24 DIAGNOSIS — T84020D Dislocation of internal right hip prosthesis, subsequent encounter: Secondary | ICD-10-CM | POA: Diagnosis not present

## 2015-10-24 DIAGNOSIS — Z87440 Personal history of urinary (tract) infections: Secondary | ICD-10-CM | POA: Diagnosis not present

## 2015-10-24 DIAGNOSIS — I10 Essential (primary) hypertension: Secondary | ICD-10-CM | POA: Diagnosis not present

## 2015-10-24 DIAGNOSIS — M150 Primary generalized (osteo)arthritis: Secondary | ICD-10-CM | POA: Diagnosis not present

## 2015-10-26 DIAGNOSIS — I4891 Unspecified atrial fibrillation: Secondary | ICD-10-CM | POA: Diagnosis not present

## 2015-10-26 DIAGNOSIS — J449 Chronic obstructive pulmonary disease, unspecified: Secondary | ICD-10-CM | POA: Diagnosis not present

## 2015-10-26 DIAGNOSIS — T84020D Dislocation of internal right hip prosthesis, subsequent encounter: Secondary | ICD-10-CM | POA: Diagnosis not present

## 2015-10-26 DIAGNOSIS — I2510 Atherosclerotic heart disease of native coronary artery without angina pectoris: Secondary | ICD-10-CM | POA: Diagnosis not present

## 2015-10-26 DIAGNOSIS — J45909 Unspecified asthma, uncomplicated: Secondary | ICD-10-CM | POA: Diagnosis not present

## 2015-10-26 DIAGNOSIS — E119 Type 2 diabetes mellitus without complications: Secondary | ICD-10-CM | POA: Diagnosis not present

## 2015-10-29 DIAGNOSIS — J45909 Unspecified asthma, uncomplicated: Secondary | ICD-10-CM | POA: Diagnosis not present

## 2015-10-29 DIAGNOSIS — I2510 Atherosclerotic heart disease of native coronary artery without angina pectoris: Secondary | ICD-10-CM | POA: Diagnosis not present

## 2015-10-29 DIAGNOSIS — I4891 Unspecified atrial fibrillation: Secondary | ICD-10-CM | POA: Diagnosis not present

## 2015-10-29 DIAGNOSIS — E119 Type 2 diabetes mellitus without complications: Secondary | ICD-10-CM | POA: Diagnosis not present

## 2015-10-29 DIAGNOSIS — J449 Chronic obstructive pulmonary disease, unspecified: Secondary | ICD-10-CM | POA: Diagnosis not present

## 2015-10-29 DIAGNOSIS — T84020D Dislocation of internal right hip prosthesis, subsequent encounter: Secondary | ICD-10-CM | POA: Diagnosis not present

## 2015-10-30 NOTE — Discharge Summary (Signed)
Physician Discharge Summary  Patient ID: [name] MRN: [id number] DOB/AGE: November 20, 1945 70 y.o.  Admit date: 10/22/2015 Discharge date: 10/23/2015   Procedures:  Procedure(s) (LRB): RIGHT  HIP REVISION (Right)  Attending Physician:  Dr. 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 Myocardial infarction (HCC) 2000  . COPD (chronic obstructive pulmonary disease) (HCC)   . Hyperlipidemia   . OA (osteoarthritis) of knee   . Coronary artery disease   . Asthma   . Elevated LFTs   . ETOH abuse   . Tobacco abuse   . Osteoarthritis   . H/O atrial fibrillation without current medication     only one time when she had sepsis  . Breast cancer (HCC)     right breast  . Emphysema lung (HCC)   . Hypertension     hx of but not on any medications    HPI:    [name], 70 y.o. female, has a history of pain and functional disability in the right hip due to instability and patient has failed non-surgical conservative treatments for greater than 12 weeks to include NSAID's and/or analgesics and activity modification. The indications for the revision total hip arthroplasty are instability with dislocations. Onset of symptoms was gradual starting ~1 years ago with gradually worsening course since that time. Prior procedures on the right hip include arthroplasty. Patient currently rates pain in the right hip at 6 out of 10 with activity. There is 3 incidents of dislocations. Patient has evidence of previous THA by imaging studies. This condition presents safety issues increasing the risk of falls. There is no current active infection. Risks, benefits and expectations were discussed with the patient. Risks including but not limited to the risk of anesthesia, blood clots, nerve damage, blood vessel  damage, failure of the prosthesis, infection and up to and including death. Patient understand the risks, benefits and expectations and wishes to proceed with surgery.   PCP: Redmond Baseman, MD   Discharged Condition: good  Hospital Course:  Patient underwent the above stated procedure on 10/22/2015. Patient tolerated the procedure well and brought to the recovery room in good condition and subsequently to the floor.  POD #1 BP: 133/59 ; Pulse: 56 ; Temp: 98.5 F (36.9 C) ; Resp: 18 Patient reports pain as mild. Doing well. No problems or events.Ready to be discharged home. Neurovascular intact and incision: dressing C/D/I  LABS  Basename    HGB     11.1  HCT     32.7    Discharge Exam: General appearance: alert, cooperative and no distress Extremities: Homans sign is negative, no sign of DVT, no edema, redness or tenderness in the calves or thighs and no ulcers, gangrene or trophic changes  Disposition: Home with follow up in 2 weeks   Follow-up Information    Follow up with Shelda Pal, MD. Schedule an appointment as soon as possible for a visit in 2 weeks.   Specialty:  Orthopedic Surgery   Contact information:   [address] [place] [address] [phone]       Follow up with [facility].   Why:  home health physical therapy   Contact information:   [address] [place] [address] [phone]  Discharge Instructions    Call MD / Call 911    Complete by:  As directed   If you experience chest pain or shortness of breath, CALL 911 and be transported to the hospital emergency room.  If you develope a fever above 101 F, pus (white drainage) or increased drainage or redness at the wound, or calf pain, call your surgeon's office.     Change dressing    Complete by:  As directed   Maintain surgical dressing until follow up in the clinic. If the edges start to pull up, may reinforce with tape. If the  dressing is no longer working, may remove and cover with gauze and tape, but must keep the area dry and clean.  Call with any questions or concerns.     Constipation Prevention    Complete by:  As directed   Drink plenty of fluids.  Prune juice may be helpful.  You may use a stool softener, such as Colace (over the counter) 100 mg twice a day.  Use MiraLax (over the counter) for constipation as needed.     Diet - low sodium heart healthy    Complete by:  As directed      Discharge instructions    Complete by:  As directed   Maintain surgical dressing until follow up in the clinic. If the edges start to pull up, may reinforce with tape. If the dressing is no longer working, may remove and cover with gauze and tape, but must keep the area dry and clean.  Follow up in 2 weeks at [facility]. Call with any questions or concerns.     Increase activity slowly as tolerated    Complete by:  As directed   Weight bearing as tolerated with assist device (walker, cane, etc) as directed, use it as long as suggested by your surgeon or therapist, typically at least 4-6 weeks.     TED hose    Complete by:  As directed   Use stockings (TED hose) for 2 weeks on both leg(s).  You may remove them at night for sleeping.     Weight bearing as tolerated    Complete by:  As directed   Laterality:  right  Extremity:  Lower             Medication List    STOP taking these medications        diclofenac 75 MG EC tablet  Commonly known as:  VOLTAREN      TAKE these medications        albuterol 108 (90 Base) MCG/ACT inhaler  Commonly known as:  PROVENTIL HFA;VENTOLIN HFA  Inhale 2 puffs into the lungs every 6 (six) hours as needed for wheezing.     ALPRAZolam 0.25 MG tablet  Commonly known as:  XANAX  Take one tablet daily or as needed     aspirin 325 MG EC tablet  Take 1 tablet (325 mg total) by mouth 2 (two) times daily.     budesonide-formoterol 160-4.5 MCG/ACT inhaler  Commonly known  as:  SYMBICORT  Inhale 2 puffs into the lungs 2 (two) times daily as needed (Wheezing). Reported on 10/16/2015     docusate sodium 100 MG capsule  Commonly known as:  COLACE  Take 1 capsule (100 mg total) by mouth 2 (two) times daily.     ferrous sulfate 325 (65 FE) MG tablet  Take 1 tablet (325 mg total) by mouth 3 (three) times daily after meals.  furosemide 40 MG tablet  Commonly known as:  LASIX  Take 1 tablet (40 mg total) by mouth 2 (two) times daily.     gabapentin 600 MG tablet  Commonly known as:  NEURONTIN  Take 600 mg by mouth 4 (four) times daily.     losartan 25 MG tablet  Commonly known as:  COZAAR  Take 25 mg by mouth daily.     methocarbamol 500 MG tablet  Commonly known as:  ROBAXIN  Take 1 tablet (500 mg total) by mouth every 6 (six) hours as needed for muscle spasms.     morphine 30 MG tablet  Commonly known as:  MSIR  Take 30 mg by mouth 4 (four) times daily.     Oxycodone HCl 10 MG Tabs  Take 1-3 tablets (10-30 mg total) by mouth every 4 (four) hours as needed for severe pain.     polyethylene glycol packet  Commonly known as:  MIRALAX / GLYCOLAX  Take 17 g by mouth 2 (two) times daily.     PROBIOTIC PO  Take 1 capsule by mouth every other day.         Signed: Anastasio Auerbach. Donathan Buller   PA-C  10/30/2015, 9:57 AM

## 2015-10-31 DIAGNOSIS — I2510 Atherosclerotic heart disease of native coronary artery without angina pectoris: Secondary | ICD-10-CM | POA: Diagnosis not present

## 2015-10-31 DIAGNOSIS — E119 Type 2 diabetes mellitus without complications: Secondary | ICD-10-CM | POA: Diagnosis not present

## 2015-10-31 DIAGNOSIS — I4891 Unspecified atrial fibrillation: Secondary | ICD-10-CM | POA: Diagnosis not present

## 2015-10-31 DIAGNOSIS — J449 Chronic obstructive pulmonary disease, unspecified: Secondary | ICD-10-CM | POA: Diagnosis not present

## 2015-10-31 DIAGNOSIS — J45909 Unspecified asthma, uncomplicated: Secondary | ICD-10-CM | POA: Diagnosis not present

## 2015-10-31 DIAGNOSIS — T84020D Dislocation of internal right hip prosthesis, subsequent encounter: Secondary | ICD-10-CM | POA: Diagnosis not present

## 2015-11-05 DIAGNOSIS — Z471 Aftercare following joint replacement surgery: Secondary | ICD-10-CM | POA: Diagnosis not present

## 2015-11-05 DIAGNOSIS — Z96641 Presence of right artificial hip joint: Secondary | ICD-10-CM | POA: Diagnosis not present

## 2015-11-06 DIAGNOSIS — I4891 Unspecified atrial fibrillation: Secondary | ICD-10-CM | POA: Diagnosis not present

## 2015-11-06 DIAGNOSIS — I2510 Atherosclerotic heart disease of native coronary artery without angina pectoris: Secondary | ICD-10-CM | POA: Diagnosis not present

## 2015-11-06 DIAGNOSIS — E119 Type 2 diabetes mellitus without complications: Secondary | ICD-10-CM | POA: Diagnosis not present

## 2015-11-06 DIAGNOSIS — J449 Chronic obstructive pulmonary disease, unspecified: Secondary | ICD-10-CM | POA: Diagnosis not present

## 2015-11-06 DIAGNOSIS — J45909 Unspecified asthma, uncomplicated: Secondary | ICD-10-CM | POA: Diagnosis not present

## 2015-11-06 DIAGNOSIS — T84020D Dislocation of internal right hip prosthesis, subsequent encounter: Secondary | ICD-10-CM | POA: Diagnosis not present

## 2015-11-08 DIAGNOSIS — T84020D Dislocation of internal right hip prosthesis, subsequent encounter: Secondary | ICD-10-CM | POA: Diagnosis not present

## 2015-11-08 DIAGNOSIS — I4891 Unspecified atrial fibrillation: Secondary | ICD-10-CM | POA: Diagnosis not present

## 2015-11-08 DIAGNOSIS — J45909 Unspecified asthma, uncomplicated: Secondary | ICD-10-CM | POA: Diagnosis not present

## 2015-11-08 DIAGNOSIS — E119 Type 2 diabetes mellitus without complications: Secondary | ICD-10-CM | POA: Diagnosis not present

## 2015-11-08 DIAGNOSIS — J449 Chronic obstructive pulmonary disease, unspecified: Secondary | ICD-10-CM | POA: Diagnosis not present

## 2015-11-08 DIAGNOSIS — I2510 Atherosclerotic heart disease of native coronary artery without angina pectoris: Secondary | ICD-10-CM | POA: Diagnosis not present

## 2015-11-13 DIAGNOSIS — J45909 Unspecified asthma, uncomplicated: Secondary | ICD-10-CM | POA: Diagnosis not present

## 2015-11-13 DIAGNOSIS — E119 Type 2 diabetes mellitus without complications: Secondary | ICD-10-CM | POA: Diagnosis not present

## 2015-11-13 DIAGNOSIS — I2510 Atherosclerotic heart disease of native coronary artery without angina pectoris: Secondary | ICD-10-CM | POA: Diagnosis not present

## 2015-11-13 DIAGNOSIS — T84020D Dislocation of internal right hip prosthesis, subsequent encounter: Secondary | ICD-10-CM | POA: Diagnosis not present

## 2015-11-13 DIAGNOSIS — J449 Chronic obstructive pulmonary disease, unspecified: Secondary | ICD-10-CM | POA: Diagnosis not present

## 2015-11-13 DIAGNOSIS — I4891 Unspecified atrial fibrillation: Secondary | ICD-10-CM | POA: Diagnosis not present

## 2015-11-15 DIAGNOSIS — E119 Type 2 diabetes mellitus without complications: Secondary | ICD-10-CM | POA: Diagnosis not present

## 2015-11-15 DIAGNOSIS — T84020D Dislocation of internal right hip prosthesis, subsequent encounter: Secondary | ICD-10-CM | POA: Diagnosis not present

## 2015-11-15 DIAGNOSIS — J449 Chronic obstructive pulmonary disease, unspecified: Secondary | ICD-10-CM | POA: Diagnosis not present

## 2015-11-15 DIAGNOSIS — J45909 Unspecified asthma, uncomplicated: Secondary | ICD-10-CM | POA: Diagnosis not present

## 2015-11-15 DIAGNOSIS — I2510 Atherosclerotic heart disease of native coronary artery without angina pectoris: Secondary | ICD-10-CM | POA: Diagnosis not present

## 2015-11-15 DIAGNOSIS — I4891 Unspecified atrial fibrillation: Secondary | ICD-10-CM | POA: Diagnosis not present

## 2015-12-12 DIAGNOSIS — Z96641 Presence of right artificial hip joint: Secondary | ICD-10-CM | POA: Diagnosis not present

## 2015-12-12 DIAGNOSIS — Z471 Aftercare following joint replacement surgery: Secondary | ICD-10-CM | POA: Diagnosis not present

## 2016-01-02 DIAGNOSIS — R1314 Dysphagia, pharyngoesophageal phase: Secondary | ICD-10-CM | POA: Diagnosis not present

## 2016-01-02 DIAGNOSIS — R490 Dysphonia: Secondary | ICD-10-CM | POA: Diagnosis not present

## 2016-01-02 DIAGNOSIS — R0989 Other specified symptoms and signs involving the circulatory and respiratory systems: Secondary | ICD-10-CM | POA: Diagnosis not present

## 2016-01-02 DIAGNOSIS — F17200 Nicotine dependence, unspecified, uncomplicated: Secondary | ICD-10-CM | POA: Diagnosis not present

## 2016-01-02 DIAGNOSIS — E119 Type 2 diabetes mellitus without complications: Secondary | ICD-10-CM | POA: Diagnosis not present

## 2016-01-02 DIAGNOSIS — I2510 Atherosclerotic heart disease of native coronary artery without angina pectoris: Secondary | ICD-10-CM | POA: Diagnosis not present

## 2016-01-02 DIAGNOSIS — J449 Chronic obstructive pulmonary disease, unspecified: Secondary | ICD-10-CM | POA: Diagnosis not present

## 2016-01-07 DIAGNOSIS — B3781 Candidal esophagitis: Secondary | ICD-10-CM | POA: Diagnosis not present

## 2016-01-07 DIAGNOSIS — F17210 Nicotine dependence, cigarettes, uncomplicated: Secondary | ICD-10-CM | POA: Diagnosis not present

## 2016-01-07 DIAGNOSIS — R490 Dysphonia: Secondary | ICD-10-CM | POA: Diagnosis not present

## 2016-01-07 DIAGNOSIS — B370 Candidal stomatitis: Secondary | ICD-10-CM | POA: Diagnosis not present

## 2016-01-07 DIAGNOSIS — J449 Chronic obstructive pulmonary disease, unspecified: Secondary | ICD-10-CM | POA: Diagnosis not present

## 2016-01-07 DIAGNOSIS — R1314 Dysphagia, pharyngoesophageal phase: Secondary | ICD-10-CM | POA: Diagnosis not present

## 2016-01-30 ENCOUNTER — Ambulatory Visit (INDEPENDENT_AMBULATORY_CARE_PROVIDER_SITE_OTHER): Payer: Medicare Other | Admitting: Cardiology

## 2016-01-30 ENCOUNTER — Encounter: Payer: Self-pay | Admitting: Cardiology

## 2016-01-30 VITALS — BP 118/70 | HR 68 | Ht 62.0 in | Wt 154.0 lb

## 2016-01-30 DIAGNOSIS — F17200 Nicotine dependence, unspecified, uncomplicated: Secondary | ICD-10-CM

## 2016-01-30 DIAGNOSIS — I10 Essential (primary) hypertension: Secondary | ICD-10-CM | POA: Diagnosis not present

## 2016-01-30 DIAGNOSIS — I2510 Atherosclerotic heart disease of native coronary artery without angina pectoris: Secondary | ICD-10-CM | POA: Diagnosis not present

## 2016-01-30 DIAGNOSIS — E785 Hyperlipidemia, unspecified: Secondary | ICD-10-CM

## 2016-01-30 NOTE — Progress Notes (Signed)
01/30/2016   PCP: Anthoney Harada, MD   Chief Complaint  Patient presents with  . 6 MONTHS  . Atrial Flutter    In the mornings.  . Edema    Primary Cardiologist:Dr. P. Martinique    HPI:  [name] is seen for follow up CAD. She has a history of coronary disease and has had multiple angioplasty procedures of the first obtuse marginal vessel. She is status post stenting of the right coronary using a 3.0 x 18 mm Tetra stent in 2000. Her last cardiac catheterization in July of 2010 showed nonobstructive disease. Her myoview study in 07/2014 was normal. She has a history of tobacco and Etoh abuse. She has chronic depression. Her last Echo 08/2014 with EF 60-65% moderate MR, PA peak pressure 39 mm Hg overall poor quality.  She was hospitalized  in Dec. 2015 for sepsis due to PNA and UTI.  During this acute episode she had rapid atrial fib and mild elevation of troponin at 1.20.  Prior to discharge she was in SR, it was felt no anticoagulation was warranted for brief episode of PAF with acute illness.  Her troponin elevation was felt to be due to demand ischemia.   She later dislocated her hip. She was having progressive swallowing problems. She was diagnosed with cervical discitis with spinal canal stenosis. She was found to have an epidural abscess extending into the mediastinum and had an esophageal perforation. This required surgical exploration with removal of hardware. She lost a significant amount of weight with all this. In February of this year she did undergo successful revision of her hip.    Overall today she is feeling better. She is still smoking but is planning on quitting with her husband. She has gained her weight back and is eating better. She still has some dysphasia. She has a rare fluttering sensation in her chest lasting 1-2 minutes. No chest pain. No dyspnea. No edema except in her right hand.     Allergies  Allergen Reactions  . Penicillins Rash    Has  patient had a PCN reaction causing immediate rash, facial/tongue/throat swelling, SOB or lightheadedness with hypotension: unsure Has patient had a PCN reaction causing severe rash involving mucus membranes or skin necrosis: unsure Has patient had a PCN reaction that required hospitalization No Has patient had a PCN reaction occurring within the last 10 years: Yes If all of the above answers are "NO", then may proceed with Cephalosporin use.  Marland Kitchen Zithromax [Azithromycin Dihydrate] Other (See Comments)    ORAL ULCERS     Current Outpatient Prescriptions  Medication Sig Dispense Refill  . albuterol (PROVENTIL HFA;VENTOLIN HFA) 108 (90 BASE) MCG/ACT inhaler Inhale 2 puffs into the lungs every 6 (six) hours as needed for wheezing.     Marland Kitchen ALPRAZolam (XANAX) 0.25 MG tablet Take one tablet daily or as needed (Patient taking differently: Take 0.25 mg by mouth daily as needed for anxiety. ) 90 tablet 3  . budesonide-formoterol (SYMBICORT) 160-4.5 MCG/ACT inhaler Inhale 2 puffs into the lungs 2 (two) times daily as needed (Wheezing). Reported on 10/16/2015    . furosemide (LASIX) 40 MG tablet Take 1 tablet (40 mg total) by mouth 2 (two) times daily. (Patient taking differently: Take 40 mg by mouth daily. ) 180 tablet 3  . gabapentin (NEURONTIN) 600 MG tablet Take 600 mg by mouth 4 (four) times daily.     Marland Kitchen losartan (COZAAR) 25 MG tablet Take 25 mg  by mouth daily.   1  . methocarbamol (ROBAXIN) 500 MG tablet Take 1 tablet (500 mg total) by mouth every 6 (six) hours as needed for muscle spasms. 40 tablet 0  . morphine (MSIR) 30 MG tablet Take 30 mg by mouth 4 (four) times daily.    Marland Kitchen oxyCODONE 10 MG TABS Take 1-3 tablets (10-30 mg total) by mouth every 4 (four) hours as needed for severe pain. (Patient taking differently: Take 15 mg by mouth every 4 (four) hours as needed for severe pain. ) 120 tablet 0  . Probiotic Product (PROBIOTIC PO) Take 1 capsule by mouth every other day.     No current  facility-administered medications for this visit.    Past Medical History  Diagnosis Date  . Myocardial infarction ([place]) 2000  . COPD (chronic obstructive pulmonary disease) ([place])   . Hyperlipidemia   . OA (osteoarthritis) of knee   . Coronary artery disease   . Asthma   . Elevated LFTs   . ETOH abuse   . Tobacco abuse   . Osteoarthritis   . H/O atrial fibrillation without current medication     only one time when she had sepsis  . Breast cancer ([place])     right breast  . Emphysema lung ([place])   . Hypertension     hx of but not on any medications    Past Surgical History  Procedure Laterality Date  . Breast surgery  1991    right mastectomy  . Vulvectomy  1981    partial  . Neck fusion  2011  . Pelvic laparoscopy  2002    RSO-    . Cesarean section  '78, '80, '81    x 3  . Hysteroscopy      D & C  . Cardiac catheterization  04/05/2009    EF 60%  . Tonsillectomy and adenoidectomy    . Mastectomy    . Coronary angioplasty  08/1998    x2 OF A BIFURCATION OM-1, OM-2 LESION  . Coronary angioplasty with stent placement  01/1999    MID FIRST OBTUSE MARGINAL VESSEL  . Coronary angioplasty with stent placement  07/1999    STENTING AT THE CRUX OF THE RIGHT CORONARY ARTERY WITH A 3.8MM X 18MM TETRA STENT  . Cardiovascular stress test  01/31/2005    EF 58%  . Total hip arthroplasty  08/2010    bilat  . Hip closed reduction Right 04/26/2015    Procedure: CLOSED REDUCTION HIP;  Surgeon: Melina Schools, MD;  [facility]: [facility];  Service: Orthopedics;  Laterality: Right;  . Incision and drainage abscess N/A 05/03/2015    Procedure: INCISION AND DRAINAGE CERVICAL  ABSCESS AND REMOVAL OF HARDWARE;  Surgeon: Melina Schools, MD;  Location: [facility];  Service: Orthopedics;  Laterality: N/A;  . Hardware removal N/A 05/03/2015    Procedure: HARDWARE REMOVAL;  Surgeon: Melina Schools, MD;  Location: [facility];  Service: Orthopedics;  Laterality: N/A;  . Rigid esophagoscopy N/A 05/03/2015    Procedure:  RIGID ESOPHAGOSCOPY;  Surgeon: Jodi Marble, MD;  Location: [facility];  Service: ENT;  Laterality: N/A;  . Direct laryngoscopy N/A 05/03/2015    Procedure: DIRECT LARYNGOSCOPY;  Surgeon: Jodi Marble, MD;  Location: [facility];  Service: ENT;  Laterality: N/A;  . Gastrostomy N/A 05/04/2015    Procedure: OPEN GASTROSTOMY WITH TUBE PLACEMENT;  Surgeon: Donnie Mesa, MD;  Location: [facility];  Service: General;  Laterality: N/A;  . Application of wound vac N/A 06/20/2015  Procedure: APPLICATION OF INCISIONAL WOUND VAC;  Surgeon: Melina Schools, MD;  Location: [facility];  Service: Orthopedics;  Laterality: N/A;  . Radiology with anesthesia Right 06/28/2015    Procedure: MRI OF CERVICAL SPINE  AND RIGHT HIP  WITH AND WITHOUT CONTRAST    (RADIOLOGY WITH ANESTHESIA);  Surgeon: Medication Radiologist, MD;  Location: [facility];  Service: Radiology;  Laterality: Right;  . Joint replacement  08/2011    bilateral hip  . Joint replacement  01/2012    right hip  . Eye surgery  05/18/2014,06/01/2014    BILATERAL CATARACT S WITH LENS IMPLANTS  . Anterior hip revision Right 10/22/2015    Procedure: RIGHT  HIP REVISION;  Surgeon: Paralee Cancel, MD;  [facility];  Service: Orthopedics;  Laterality: Right;    ROS:As noted in HPI. All other systems are reviewed and are negative.   Wt Readings from Last 3 Encounters:  01/30/16 69.854 kg (154 lb)  10/22/15 60.782 kg (134 lb)  10/16/15 60.782 kg (134 lb)    PHYSICAL EXAM BP 118/70 mmHg  Pulse 68  Ht 5\' 2"  (1.575 m)  Wt 69.854 kg (154 lb)  BMI 28.16 kg/m2 General:Pleasant affect, NAD Skin:Warm and dry, brisk capillary refill HEENT:normocephalic, sclera clear, mucus membranes moist Neck:supple, no JVD, no bruits  Heart:S1S2 RRR without murmur, gallup, rub or click Lungs:scattered rhonchi [id number], non tender, + BS, do not palpate liver spleen or masses.  Ext:no lower ext edema   2+ radial pulses  Neuro:alert and oriented X 3, MAE, follows commands, + facial  symmetry  Laboratory data:  Ecg today shows NSR with nonspecific TWA. I have personally reviewed and interpreted this study.  ASSESSMENT AND PLAN 1.  CAD- normal myoview in October 2015. No active angina. Continue medical therapy  2. HTN well controlled  3. Paroxysmal Afib- only noted during episode of sepsis. Minor palpitations at times. Will monitor for now. If symptoms become more frequent would recommend an event monitor.   4. Tobacco abuse. Encourage smoking cessation.  5. Etoh abuse. Encourage continued abstinence.   7. COPD  9. S/p complication with cervical discitis, epidural abscess, and esophageal perforation. She seems to be making a good recovery.

## 2016-01-30 NOTE — Patient Instructions (Signed)
Continue your current therapy  Quit smoking  I will see you in 6 months. 

## 2016-02-12 DIAGNOSIS — R1314 Dysphagia, pharyngoesophageal phase: Secondary | ICD-10-CM | POA: Diagnosis not present

## 2016-02-12 DIAGNOSIS — F17210 Nicotine dependence, cigarettes, uncomplicated: Secondary | ICD-10-CM | POA: Diagnosis not present

## 2016-02-12 DIAGNOSIS — Z8739 Personal history of other diseases of the musculoskeletal system and connective tissue: Secondary | ICD-10-CM | POA: Diagnosis not present

## 2016-02-12 DIAGNOSIS — Z720 Tobacco use: Secondary | ICD-10-CM | POA: Insufficient documentation

## 2016-02-12 DIAGNOSIS — R05 Cough: Secondary | ICD-10-CM | POA: Diagnosis not present

## 2016-02-13 ENCOUNTER — Other Ambulatory Visit: Payer: Self-pay | Admitting: Otolaryngology

## 2016-02-13 DIAGNOSIS — R1319 Other dysphagia: Secondary | ICD-10-CM

## 2016-02-13 DIAGNOSIS — R1310 Dysphagia, unspecified: Secondary | ICD-10-CM

## 2016-02-15 ENCOUNTER — Ambulatory Visit
Admission: RE | Admit: 2016-02-15 | Discharge: 2016-02-15 | Disposition: A | Discharge status: Home / Self Care | Payer: Medicare Other | Source: Ambulatory Visit | Attending: Otolaryngology | Admitting: Otolaryngology

## 2016-02-15 DIAGNOSIS — R1319 Other dysphagia: Secondary | ICD-10-CM

## 2016-02-15 DIAGNOSIS — R1310 Dysphagia, unspecified: Secondary | ICD-10-CM

## 2016-02-15 DIAGNOSIS — T17320A Food in larynx causing asphyxiation, initial encounter: Secondary | ICD-10-CM | POA: Diagnosis not present

## 2016-04-02 DIAGNOSIS — M5134 Other intervertebral disc degeneration, thoracic region: Secondary | ICD-10-CM | POA: Diagnosis not present

## 2016-04-02 DIAGNOSIS — G894 Chronic pain syndrome: Secondary | ICD-10-CM | POA: Diagnosis not present

## 2016-04-02 DIAGNOSIS — M546 Pain in thoracic spine: Secondary | ICD-10-CM | POA: Diagnosis not present

## 2016-04-02 DIAGNOSIS — Z79891 Long term (current) use of opiate analgesic: Secondary | ICD-10-CM | POA: Diagnosis not present

## 2016-04-17 DIAGNOSIS — M7542 Impingement syndrome of left shoulder: Secondary | ICD-10-CM | POA: Diagnosis not present

## 2016-04-22 DIAGNOSIS — E1165 Type 2 diabetes mellitus with hyperglycemia: Secondary | ICD-10-CM | POA: Diagnosis not present

## 2016-04-22 DIAGNOSIS — Z794 Long term (current) use of insulin: Secondary | ICD-10-CM | POA: Diagnosis not present

## 2016-05-13 DIAGNOSIS — M7542 Impingement syndrome of left shoulder: Secondary | ICD-10-CM | POA: Diagnosis not present

## 2016-06-17 DIAGNOSIS — Z720 Tobacco use: Secondary | ICD-10-CM | POA: Diagnosis not present

## 2016-06-17 DIAGNOSIS — J029 Acute pharyngitis, unspecified: Secondary | ICD-10-CM | POA: Diagnosis not present

## 2016-06-17 DIAGNOSIS — J069 Acute upper respiratory infection, unspecified: Secondary | ICD-10-CM | POA: Diagnosis not present

## 2016-06-20 DIAGNOSIS — F17200 Nicotine dependence, unspecified, uncomplicated: Secondary | ICD-10-CM | POA: Diagnosis not present

## 2016-06-20 DIAGNOSIS — I2510 Atherosclerotic heart disease of native coronary artery without angina pectoris: Secondary | ICD-10-CM | POA: Diagnosis not present

## 2016-06-20 DIAGNOSIS — Z23 Encounter for immunization: Secondary | ICD-10-CM | POA: Diagnosis not present

## 2016-06-20 DIAGNOSIS — J449 Chronic obstructive pulmonary disease, unspecified: Secondary | ICD-10-CM | POA: Diagnosis not present

## 2016-06-20 DIAGNOSIS — Z794 Long term (current) use of insulin: Secondary | ICD-10-CM | POA: Diagnosis not present

## 2016-06-20 DIAGNOSIS — E1165 Type 2 diabetes mellitus with hyperglycemia: Secondary | ICD-10-CM | POA: Diagnosis not present

## 2016-06-20 DIAGNOSIS — J209 Acute bronchitis, unspecified: Secondary | ICD-10-CM | POA: Diagnosis not present

## 2016-06-20 DIAGNOSIS — Z1211 Encounter for screening for malignant neoplasm of colon: Secondary | ICD-10-CM | POA: Diagnosis not present

## 2016-06-23 DIAGNOSIS — Z1211 Encounter for screening for malignant neoplasm of colon: Secondary | ICD-10-CM | POA: Diagnosis not present

## 2016-06-23 DIAGNOSIS — E1165 Type 2 diabetes mellitus with hyperglycemia: Secondary | ICD-10-CM | POA: Diagnosis not present

## 2016-06-23 DIAGNOSIS — Z794 Long term (current) use of insulin: Secondary | ICD-10-CM | POA: Diagnosis not present

## 2016-06-23 DIAGNOSIS — J209 Acute bronchitis, unspecified: Secondary | ICD-10-CM | POA: Diagnosis not present

## 2016-06-23 DIAGNOSIS — J449 Chronic obstructive pulmonary disease, unspecified: Secondary | ICD-10-CM | POA: Diagnosis not present

## 2016-06-23 DIAGNOSIS — I2510 Atherosclerotic heart disease of native coronary artery without angina pectoris: Secondary | ICD-10-CM | POA: Diagnosis not present

## 2016-06-23 DIAGNOSIS — Z23 Encounter for immunization: Secondary | ICD-10-CM | POA: Diagnosis not present

## 2016-06-23 DIAGNOSIS — F17200 Nicotine dependence, unspecified, uncomplicated: Secondary | ICD-10-CM | POA: Diagnosis not present

## 2016-07-07 DIAGNOSIS — M4184 Other forms of scoliosis, thoracic region: Secondary | ICD-10-CM | POA: Diagnosis not present

## 2016-07-07 DIAGNOSIS — M5136 Other intervertebral disc degeneration, lumbar region: Secondary | ICD-10-CM | POA: Diagnosis not present

## 2016-10-23 ENCOUNTER — Other Ambulatory Visit: Payer: Self-pay

## 2016-10-23 MED ORDER — FUROSEMIDE 40 MG PO TABS
40.0000 mg | ORAL_TABLET | Freq: Two times a day (BID) | ORAL | 0 refills | Status: DC
Start: 2016-10-23 — End: 2017-02-10

## 2016-10-28 DIAGNOSIS — M4184 Other forms of scoliosis, thoracic region: Secondary | ICD-10-CM | POA: Diagnosis not present

## 2016-10-28 DIAGNOSIS — G894 Chronic pain syndrome: Secondary | ICD-10-CM | POA: Diagnosis not present

## 2016-10-28 DIAGNOSIS — Z01812 Encounter for preprocedural laboratory examination: Secondary | ICD-10-CM | POA: Diagnosis not present

## 2016-10-28 DIAGNOSIS — M542 Cervicalgia: Secondary | ICD-10-CM | POA: Diagnosis not present

## 2016-10-30 DIAGNOSIS — M5136 Other intervertebral disc degeneration, lumbar region: Secondary | ICD-10-CM | POA: Diagnosis not present

## 2016-11-04 DIAGNOSIS — M542 Cervicalgia: Secondary | ICD-10-CM | POA: Diagnosis not present

## 2016-11-04 DIAGNOSIS — M4184 Other forms of scoliosis, thoracic region: Secondary | ICD-10-CM | POA: Diagnosis not present

## 2016-11-05 ENCOUNTER — Other Ambulatory Visit: Payer: Self-pay | Admitting: Orthopedic Surgery

## 2016-11-05 DIAGNOSIS — Z4789 Encounter for other orthopedic aftercare: Secondary | ICD-10-CM

## 2016-11-06 ENCOUNTER — Inpatient Hospital Stay (HOSPITAL_COMMUNITY)
Admission: AD | Admit: 2016-11-06 | Discharge: 2016-11-16 | DRG: 579 | Disposition: A | Discharge status: Home Health | Payer: Medicare Other | Source: Ambulatory Visit | Attending: Orthopedic Surgery | Admitting: Orthopedic Surgery

## 2016-11-06 ENCOUNTER — Encounter (HOSPITAL_COMMUNITY): Payer: Self-pay | Admitting: Orthopedic Surgery

## 2016-11-06 ENCOUNTER — Ambulatory Visit
Admission: RE | Admit: 2016-11-06 | Discharge: 2016-11-06 | Disposition: A | Discharge status: Home / Self Care | Payer: Medicare Other | Source: Ambulatory Visit | Attending: Orthopedic Surgery | Admitting: Orthopedic Surgery

## 2016-11-06 DIAGNOSIS — Z9842 Cataract extraction status, left eye: Secondary | ICD-10-CM

## 2016-11-06 DIAGNOSIS — G061 Intraspinal abscess and granuloma: Secondary | ICD-10-CM | POA: Diagnosis present

## 2016-11-06 DIAGNOSIS — Z96643 Presence of artificial hip joint, bilateral: Secondary | ICD-10-CM | POA: Diagnosis present

## 2016-11-06 DIAGNOSIS — J386 Stenosis of larynx: Secondary | ICD-10-CM | POA: Diagnosis present

## 2016-11-06 DIAGNOSIS — Z955 Presence of coronary angioplasty implant and graft: Secondary | ICD-10-CM | POA: Diagnosis not present

## 2016-11-06 DIAGNOSIS — I2510 Atherosclerotic heart disease of native coronary artery without angina pectoris: Secondary | ICD-10-CM | POA: Diagnosis present

## 2016-11-06 DIAGNOSIS — Z961 Presence of intraocular lens: Secondary | ICD-10-CM | POA: Diagnosis present

## 2016-11-06 DIAGNOSIS — K228 Other specified diseases of esophagus: Secondary | ICD-10-CM | POA: Diagnosis present

## 2016-11-06 DIAGNOSIS — Z95828 Presence of other vascular implants and grafts: Secondary | ICD-10-CM | POA: Diagnosis not present

## 2016-11-06 DIAGNOSIS — R1313 Dysphagia, pharyngeal phase: Secondary | ICD-10-CM | POA: Diagnosis not present

## 2016-11-06 DIAGNOSIS — Z794 Long term (current) use of insulin: Secondary | ICD-10-CM

## 2016-11-06 DIAGNOSIS — Z9689 Presence of other specified functional implants: Secondary | ICD-10-CM | POA: Diagnosis not present

## 2016-11-06 DIAGNOSIS — F17210 Nicotine dependence, cigarettes, uncomplicated: Secondary | ICD-10-CM | POA: Diagnosis not present

## 2016-11-06 DIAGNOSIS — E1169 Type 2 diabetes mellitus with other specified complication: Secondary | ICD-10-CM | POA: Diagnosis present

## 2016-11-06 DIAGNOSIS — Z4682 Encounter for fitting and adjustment of non-vascular catheter: Secondary | ICD-10-CM | POA: Diagnosis not present

## 2016-11-06 DIAGNOSIS — R1310 Dysphagia, unspecified: Secondary | ICD-10-CM | POA: Diagnosis not present

## 2016-11-06 DIAGNOSIS — J439 Emphysema, unspecified: Secondary | ICD-10-CM | POA: Diagnosis present

## 2016-11-06 DIAGNOSIS — Z8249 Family history of ischemic heart disease and other diseases of the circulatory system: Secondary | ICD-10-CM

## 2016-11-06 DIAGNOSIS — Z880 Allergy status to penicillin: Secondary | ICD-10-CM | POA: Diagnosis not present

## 2016-11-06 DIAGNOSIS — K9423 Gastrostomy malfunction: Secondary | ICD-10-CM | POA: Diagnosis not present

## 2016-11-06 DIAGNOSIS — M62830 Muscle spasm of back: Secondary | ICD-10-CM | POA: Diagnosis present

## 2016-11-06 DIAGNOSIS — Z8661 Personal history of infections of the central nervous system: Secondary | ICD-10-CM | POA: Diagnosis not present

## 2016-11-06 DIAGNOSIS — M4802 Spinal stenosis, cervical region: Secondary | ICD-10-CM | POA: Diagnosis present

## 2016-11-06 DIAGNOSIS — Z881 Allergy status to other antibiotic agents status: Secondary | ICD-10-CM | POA: Diagnosis not present

## 2016-11-06 DIAGNOSIS — E119 Type 2 diabetes mellitus without complications: Secondary | ICD-10-CM | POA: Diagnosis not present

## 2016-11-06 DIAGNOSIS — R1314 Dysphagia, pharyngoesophageal phase: Secondary | ICD-10-CM | POA: Diagnosis present

## 2016-11-06 DIAGNOSIS — K219 Gastro-esophageal reflux disease without esophagitis: Secondary | ICD-10-CM | POA: Diagnosis not present

## 2016-11-06 DIAGNOSIS — K223 Perforation of esophagus: Secondary | ICD-10-CM | POA: Diagnosis present

## 2016-11-06 DIAGNOSIS — M1710 Unilateral primary osteoarthritis, unspecified knee: Secondary | ICD-10-CM | POA: Diagnosis present

## 2016-11-06 DIAGNOSIS — Z981 Arthrodesis status: Secondary | ICD-10-CM | POA: Diagnosis not present

## 2016-11-06 DIAGNOSIS — M4622 Osteomyelitis of vertebra, cervical region: Secondary | ICD-10-CM | POA: Diagnosis present

## 2016-11-06 DIAGNOSIS — F419 Anxiety disorder, unspecified: Secondary | ICD-10-CM | POA: Diagnosis present

## 2016-11-06 DIAGNOSIS — Z9889 Other specified postprocedural states: Secondary | ICD-10-CM | POA: Diagnosis not present

## 2016-11-06 DIAGNOSIS — Z7951 Long term (current) use of inhaled steroids: Secondary | ICD-10-CM

## 2016-11-06 DIAGNOSIS — I517 Cardiomegaly: Secondary | ICD-10-CM | POA: Diagnosis not present

## 2016-11-06 DIAGNOSIS — T814XXA Infection following a procedure, initial encounter: Secondary | ICD-10-CM | POA: Diagnosis not present

## 2016-11-06 DIAGNOSIS — M4624 Osteomyelitis of vertebra, thoracic region: Secondary | ICD-10-CM | POA: Diagnosis present

## 2016-11-06 DIAGNOSIS — M4322 Fusion of spine, cervical region: Secondary | ICD-10-CM | POA: Diagnosis not present

## 2016-11-06 DIAGNOSIS — Z833 Family history of diabetes mellitus: Secondary | ICD-10-CM

## 2016-11-06 DIAGNOSIS — L0211 Cutaneous abscess of neck: Principal | ICD-10-CM | POA: Diagnosis present

## 2016-11-06 DIAGNOSIS — M7989 Other specified soft tissue disorders: Secondary | ICD-10-CM | POA: Diagnosis not present

## 2016-11-06 DIAGNOSIS — Z853 Personal history of malignant neoplasm of breast: Secondary | ICD-10-CM

## 2016-11-06 DIAGNOSIS — Z79899 Other long term (current) drug therapy: Secondary | ICD-10-CM

## 2016-11-06 DIAGNOSIS — M4623 Osteomyelitis of vertebra, cervicothoracic region: Secondary | ICD-10-CM | POA: Diagnosis not present

## 2016-11-06 DIAGNOSIS — Z4789 Encounter for other orthopedic aftercare: Secondary | ICD-10-CM

## 2016-11-06 DIAGNOSIS — I480 Paroxysmal atrial fibrillation: Secondary | ICD-10-CM | POA: Diagnosis not present

## 2016-11-06 DIAGNOSIS — Z9841 Cataract extraction status, right eye: Secondary | ICD-10-CM

## 2016-11-06 DIAGNOSIS — R633 Feeding difficulties: Secondary | ICD-10-CM | POA: Diagnosis not present

## 2016-11-06 DIAGNOSIS — R197 Diarrhea, unspecified: Secondary | ICD-10-CM | POA: Diagnosis not present

## 2016-11-06 DIAGNOSIS — J9382 Other air leak: Secondary | ICD-10-CM

## 2016-11-06 DIAGNOSIS — Z8719 Personal history of other diseases of the digestive system: Secondary | ICD-10-CM | POA: Diagnosis not present

## 2016-11-06 DIAGNOSIS — B9689 Other specified bacterial agents as the cause of diseases classified elsewhere: Secondary | ICD-10-CM | POA: Diagnosis not present

## 2016-11-06 DIAGNOSIS — M542 Cervicalgia: Secondary | ICD-10-CM | POA: Diagnosis not present

## 2016-11-06 DIAGNOSIS — E876 Hypokalemia: Secondary | ICD-10-CM | POA: Diagnosis not present

## 2016-11-06 DIAGNOSIS — I10 Essential (primary) hypertension: Secondary | ICD-10-CM | POA: Diagnosis present

## 2016-11-06 DIAGNOSIS — Z9011 Acquired absence of right breast and nipple: Secondary | ICD-10-CM

## 2016-11-06 DIAGNOSIS — Z79891 Long term (current) use of opiate analgesic: Secondary | ICD-10-CM

## 2016-11-06 DIAGNOSIS — E785 Hyperlipidemia, unspecified: Secondary | ICD-10-CM | POA: Diagnosis not present

## 2016-11-06 LAB — CBC WITH DIFFERENTIAL/PLATELET
Basophils Absolute: 0 10*3/uL (ref 0.0–0.1)
Basophils Relative: 0 %
EOS ABS: 0 10*3/uL (ref 0.0–0.7)
Eosinophils Relative: 0 %
HEMATOCRIT: 37.9 % (ref 36.0–46.0)
HEMOGLOBIN: 13.1 g/dL (ref 12.0–15.0)
LYMPHS ABS: 1.9 10*3/uL (ref 0.7–4.0)
Lymphocytes Relative: 16 %
MCH: 29.1 pg (ref 26.0–34.0)
MCHC: 34.6 g/dL (ref 30.0–36.0)
MCV: 84.2 fL (ref 78.0–100.0)
MONO ABS: 0.5 10*3/uL (ref 0.1–1.0)
MONOS PCT: 4 %
Neutro Abs: 9.5 10*3/uL — ABNORMAL HIGH (ref 1.7–7.7)
Neutrophils Relative %: 80 %
Platelets: 274 10*3/uL (ref 150–400)
RBC: 4.5 MIL/uL (ref 3.87–5.11)
RDW: 14.8 % (ref 11.5–15.5)
WBC: 11.9 10*3/uL — ABNORMAL HIGH (ref 4.0–10.5)

## 2016-11-06 LAB — GLUCOSE, CAPILLARY: GLUCOSE-CAPILLARY: 148 mg/dL — AB (ref 65–99)

## 2016-11-06 MED ORDER — LOSARTAN POTASSIUM 50 MG PO TABS: 25.0000 mg | ORAL_TABLET | Freq: Every day | ORAL | Status: DC

## 2016-11-06 MED ORDER — DEXTROSE 5 % IV SOLN
2.0000 g | Freq: Every day | INTRAVENOUS | Status: DC
  Filled 2016-11-06: qty 2

## 2016-11-06 MED ORDER — MOMETASONE FURO-FORMOTEROL FUM 200-5 MCG/ACT IN AERO
2.0000 | INHALATION_SPRAY | Freq: Two times a day (BID) | RESPIRATORY_TRACT | Status: DC
  Administered 2016-11-07 – 2016-11-16 (×13): 2 via RESPIRATORY_TRACT
  Filled 2016-11-06: qty 8.8

## 2016-11-06 MED ORDER — MORPHINE SULFATE (PF) 2 MG/ML IV SOLN
1.0000 mg | INTRAVENOUS | Status: DC | PRN
  Administered 2016-11-07 (×4): 1 mg via INTRAVENOUS
  Filled 2016-11-06 (×4): qty 1

## 2016-11-06 MED ORDER — SODIUM CHLORIDE 0.9 % IV SOLN
1.0000 g | Freq: Three times a day (TID) | INTRAVENOUS | Status: DC
  Administered 2016-11-07 – 2016-11-16 (×27): 1 g via INTRAVENOUS
  Filled 2016-11-06 (×33): qty 1

## 2016-11-06 MED ORDER — ALPRAZOLAM 0.25 MG PO TABS: 0.2500 mg | ORAL_TABLET | Freq: Every day | ORAL | Status: DC | PRN

## 2016-11-06 MED ORDER — INSULIN ASPART 100 UNIT/ML ~~LOC~~ SOLN
0.0000 [IU] | SUBCUTANEOUS | Status: DC
  Administered 2016-11-07 – 2016-11-08 (×4): 1 [IU] via SUBCUTANEOUS
  Administered 2016-11-08: 2 [IU] via SUBCUTANEOUS
  Administered 2016-11-08 – 2016-11-11 (×7): 1 [IU] via SUBCUTANEOUS
  Administered 2016-11-11: 2 [IU] via SUBCUTANEOUS
  Administered 2016-11-11: 1 [IU] via SUBCUTANEOUS
  Administered 2016-11-12: 2 [IU] via SUBCUTANEOUS
  Administered 2016-11-13 – 2016-11-14 (×5): 1 [IU] via SUBCUTANEOUS
  Administered 2016-11-15: 2 [IU] via SUBCUTANEOUS
  Administered 2016-11-15: 1 [IU] via SUBCUTANEOUS
  Administered 2016-11-15: 2 [IU] via SUBCUTANEOUS
  Administered 2016-11-15 – 2016-11-16 (×3): 1 [IU] via SUBCUTANEOUS
  Administered 2016-11-16: 2 [IU] via SUBCUTANEOUS
  Administered 2016-11-16: 1 [IU] via SUBCUTANEOUS

## 2016-11-06 MED ORDER — SODIUM CHLORIDE 0.9 % IV SOLN
INTRAVENOUS | Status: DC
  Administered 2016-11-06: 1000 mL via INTRAVENOUS

## 2016-11-06 MED ORDER — VANCOMYCIN HCL IN DEXTROSE 1-5 GM/200ML-% IV SOLN
1000.0000 mg | Freq: Two times a day (BID) | INTRAVENOUS | Status: DC
  Administered 2016-11-07 – 2016-11-11 (×9): 1000 mg via INTRAVENOUS
  Filled 2016-11-06 (×11): qty 200

## 2016-11-06 MED ORDER — ALBUTEROL SULFATE (2.5 MG/3ML) 0.083% IN NEBU: 3.0000 mL | INHALATION_SOLUTION | Freq: Four times a day (QID) | RESPIRATORY_TRACT | Status: DC | PRN

## 2016-11-06 MED ORDER — GABAPENTIN 600 MG PO TABS
600.0000 mg | ORAL_TABLET | Freq: Four times a day (QID) | ORAL | Status: DC
  Filled 2016-11-06: qty 1

## 2016-11-06 MED ORDER — DEXTROSE 5 % IV SOLN
1.0000 g | Freq: Two times a day (BID) | INTRAVENOUS | Status: DC
  Filled 2016-11-06 (×2): qty 10

## 2016-11-06 MED ORDER — MORPHINE SULFATE 15 MG PO TABS: 30.0000 mg | ORAL_TABLET | Freq: Four times a day (QID) | ORAL | Status: DC

## 2016-11-06 MED ORDER — METRONIDAZOLE IN NACL 5-0.79 MG/ML-% IV SOLN
500.0000 mg | Freq: Three times a day (TID) | INTRAVENOUS | Status: DC
  Filled 2016-11-06 (×2): qty 100

## 2016-11-06 MED ORDER — PROMETHAZINE HCL 25 MG/ML IJ SOLN
6.2500 mg | Freq: Four times a day (QID) | INTRAMUSCULAR | Status: DC | PRN
  Administered 2016-11-07: 12.5 mg via INTRAVENOUS
  Filled 2016-11-06: qty 1

## 2016-11-06 MED ORDER — OXYCODONE HCL 10 MG PO TABS: 15.0000 mg | ORAL_TABLET | ORAL | Status: DC | PRN

## 2016-11-06 MED ORDER — PROMETHAZINE HCL 25 MG RE SUPP: 25.0000 mg | Freq: Four times a day (QID) | RECTAL | Status: DC | PRN

## 2016-11-06 NOTE — Progress Notes (Addendum)
Pharmacy Antibiotic Note  [name] is a 71 y.o. female admitted on 11/06/2016 with possible cervical infection/neck abscess.  Pharmacy has been consulted for Vancomycin dosing. Pt also on Rocephin and Flagyl.  Plan: Change Rocephin to 2gm IV q24h Vancomycin 1gm IV q12h Will f/u micro data, renal function, and pt's clinical condition Vanc trough prn  Addendum: MD changing Rocephin/Flagyl to Meropenem for broader coverage. Noted pt with PCN allergy (rash) so cephalosporins not precluded. Pt has had Meropenem in the past. Plan to consult ID.  Meropenem 1gm IV q8h Will f/u ID consult   Height: 5\' 2"  (157.5 cm) Weight: 155 lb 6.4 oz (70.5 kg) IBW/kg (Calculated) : 50.1  Temp (24hrs), Avg:98.2 F (36.8 C), Min:98.2 F (36.8 C), Max:98.2 F (36.8 C)  No results for input(s): WBC, CREATININE, LATICACIDVEN, VANCOTROUGH, VANCOPEAK, VANCORANDOM, GENTTROUGH, GENTPEAK, GENTRANDOM, TOBRATROUGH, TOBRAPEAK, TOBRARND, AMIKACINPEAK, AMIKACINTROU, AMIKACIN in the last 168 hours.  CrCl cannot be calculated (Patient's most recent lab result is older than the maximum 21 days allowed.).    Allergies  Allergen Reactions  . Penicillins Rash    Has patient had a PCN reaction causing immediate rash, facial/tongue/throat swelling, SOB or lightheadedness with hypotension: unsure Has patient had a PCN reaction causing severe rash involving mucus membranes or skin necrosis: unsure Has patient had a PCN reaction that required hospitalization No Has patient had a PCN reaction occurring within the last 10 years: Yes If all of the above answers are "NO", then may proceed with Cephalosporin use.  . Zithromax [Azithromycin Dihydrate] Other (See Comments)    ORAL ULCERS     Antimicrobials this admission: 2/23 Merrem >>  2/23 Vanc >>   Dose adjustments this admission: n/a   Thank you for allowing pharmacy to be a part of this patient's care.  Sherlon Handing, PharmD, BCPS Clinical pharmacist, pager  [phone] 11/06/2016 11:12 PM

## 2016-11-06 NOTE — H&P (Signed)
Subjective:   Patient is a 71 y.o. female presented with a history of bilateral shoulder and arm pain tha has been ongoing for about two wee now.  Onset of symptoms was gradual starting 2 weeks ago with gradually worsening course since that time.  She was seen by Dr. Rolena Infante and sent for an MRI of the cervical spine.  Results were reviewed and recommended to have a CT scan which was performed earlier today. The scan showed revertebral thickening and gas bubbles from C3 to T2 consistent with infectious collection.   She is being admitted for further workup and management of this condition.   Patient Active Problem List   Diagnosis Date Noted  . 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 Infected wound, initial encounter 06/20/2015  . Cholecystitis   . Esophageal fistula   . Abscess   . Discitis   . HCAP (healthcare-associated pneumonia)   . 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[place]) 04/28/2015  . Abscess of neck   . Esophageal perforation   . Loss of weight   . Dysphagia, pharyngoesophageal phase   . 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 Hip dislocation, right ([place]) 04/26/2015  . Healthcare-associated pneumonia 04/26/2015  . Cholecystitis, acute 04/26/2015  . 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[place]) 04/26/2015  . PAF (paroxysmal atrial fibrillation) ([place]) 09/20/2014  . Elevated troponin 09/20/2014  . Acute renal failure syndrome ([place])   . Malnutrition of moderate degree ([place]) 08/29/2014  . Pneumonia   . Respiratory failure ([place])   . UTI (urinary tract infection) 08/27/2014  . Sepsis ([place]) 08/27/2014  . Atrial fibrillation with RVR ([place]) 08/27/2014  . Shock circulatory ([place]) 08/27/2014  . Acute kidney injury ([place]) 08/27/2014  . Encephalopathy acute 08/27/2014  . Hypokalemia 08/26/2014  . Hyponatremia 08/26/2014  . Acute renal failure ([place]) 08/26/2014  . Weakness 08/26/2014  . ETOH abuse 08/07/2014  . Edema extremities 07/12/2014  .  Atelectasis 08/16/2011  . Diabetes mellitus type 2, insulin dependent ([place])   . COPD (chronic obstructive pulmonary disease) ([place])   . Tobacco dependence   . Hyperlipidemia   . Hypertension   . Coronary artery disease    Past Medical History:  Diagnosis Date  . Asthma   . Breast cancer ([place])    right breast  . COPD (chronic obstructive pulmonary disease) ([place])   . Coronary artery disease   . Elevated LFTs   . Emphysema lung ([place])   . ETOH abuse   . H/O atrial fibrillation without current medication    only one time when she had sepsis  . Hyperlipidemia   . Hypertension    hx of but not on any medications  . Myocardial infarction 2000  . OA (osteoarthritis) of knee   . Osteoarthritis   . Tobacco abuse     Past Surgical History:  Procedure Laterality Date  . ANTERIOR HIP REVISION Right 10/22/2015   Procedure: RIGHT  HIP REVISION;  Surgeon: Paralee Cancel, MD;  [facility];  Service: Orthopedics;  Laterality: Right;  . APPLICATION OF WOUND VAC N/A 06/20/2015   Procedure: APPLICATION OF INCISIONAL WOUND VAC;  Surgeon: Melina Schools, MD;  Location: [facility];  Service: Orthopedics;  Laterality: N/A;  . BREAST SURGERY  1991   right mastectomy  . CARDIAC CATHETERIZATION  04/05/2009   EF 60%  . CARDIOVASCULAR STRESS TEST  01/31/2005   EF 58%  . CESAREAN SECTION  '78, '80, '81   x 3  .  CORONARY ANGIOPLASTY  08/1998   x2 OF A BIFURCATION OM-1, OM-2 LESION  . CORONARY ANGIOPLASTY WITH STENT PLACEMENT  01/1999   MID FIRST OBTUSE MARGINAL VESSEL  . CORONARY ANGIOPLASTY WITH STENT PLACEMENT  07/1999   STENTING AT THE CRUX OF THE RIGHT CORONARY ARTERY WITH A 3.8MM X 18MM TETRA STENT  . DIRECT LARYNGOSCOPY N/A 05/03/2015   Procedure: DIRECT LARYNGOSCOPY;  Surgeon: Jodi Marble, MD;  Location: [facility];  Service: ENT;  Laterality: N/A;  . EYE SURGERY  05/18/2014,06/01/2014   BILATERAL CATARACT S WITH LENS IMPLANTS  . GASTROSTOMY N/A 05/04/2015   Procedure: OPEN GASTROSTOMY WITH TUBE  PLACEMENT;  Surgeon: Donnie Mesa, MD;  Location: [facility];  Service: General;  Laterality: N/A;  . HARDWARE REMOVAL N/A 05/03/2015   Procedure: HARDWARE REMOVAL;  Surgeon: Melina Schools, MD;  Location: [facility];  Service: Orthopedics;  Laterality: N/A;  . HIP CLOSED REDUCTION Right 04/26/2015   Procedure: CLOSED REDUCTION HIP;  Surgeon: Melina Schools, MD;  [facility];  Service: Orthopedics;  Laterality: Right;  . HYSTEROSCOPY     D & C  . INCISION AND DRAINAGE ABSCESS N/A 05/03/2015   Procedure: INCISION AND DRAINAGE CERVICAL  ABSCESS AND REMOVAL OF HARDWARE;  Surgeon: Melina Schools, MD;  Location: [facility];  Service: Orthopedics;  Laterality: N/A;  . JOINT REPLACEMENT  08/2011   bilateral hip  . JOINT REPLACEMENT  01/2012   right hip  . MASTECTOMY    . neck fusion  2011  . PELVIC LAPAROSCOPY  2002   RSO-    . RADIOLOGY WITH ANESTHESIA Right 06/28/2015   Procedure: MRI OF CERVICAL SPINE  AND RIGHT HIP  WITH AND WITHOUT CONTRAST    (RADIOLOGY WITH ANESTHESIA);  Surgeon: Medication Radiologist, MD;  Location: [facility];  Service: Radiology;  Laterality: Right;  . RIGID ESOPHAGOSCOPY N/A 05/03/2015   Procedure: RIGID ESOPHAGOSCOPY;  Surgeon: Jodi Marble, MD;  Location: [facility];  Service: ENT;  Laterality: N/A;  . TONSILLECTOMY AND ADENOIDECTOMY    . TOTAL HIP ARTHROPLASTY  08/2010   bilat  . VULVECTOMY  1981   partial    Prescriptions Prior to Admission  Medication Sig Dispense Refill Last Dose  . albuterol (PROVENTIL HFA;VENTOLIN HFA) 108 (90 BASE) MCG/ACT inhaler Inhale 2 puffs into the lungs every 6 (six) hours as needed for wheezing.    Taking  . ALPRAZolam (XANAX) 0.25 MG tablet Take one tablet daily or as needed (Patient taking differently: Take 0.25 mg by mouth daily as needed for anxiety. ) 90 tablet 3 Taking  . budesonide-formoterol (SYMBICORT) 160-4.5 MCG/ACT inhaler Inhale 2 puffs into the lungs 2 (two) times daily as needed (Wheezing). Reported on 10/16/2015   Taking  . furosemide  (LASIX) 40 MG tablet Take 1 tablet (40 mg total) by mouth 2 (two) times daily. 90 tablet 0   . gabapentin (NEURONTIN) 600 MG tablet Take 600 mg by mouth 4 (four) times daily.    Taking  . losartan (COZAAR) 25 MG tablet Take 25 mg by mouth daily.   1 Taking  . methocarbamol (ROBAXIN) 500 MG tablet Take 1 tablet (500 mg total) by mouth every 6 (six) hours as needed for muscle spasms. 40 tablet 0 Taking  . morphine (MSIR) 30 MG tablet Take 30 mg by mouth 4 (four) times daily.   Taking  . oxyCODONE 10 MG TABS Take 1-3 tablets (10-30 mg total) by mouth every 4 (four) hours as needed for severe pain. (Patient taking differently: Take 15 mg  by mouth every 4 (four) hours as needed for severe pain. ) 120 tablet 0 Taking  .        Allergies  Allergen Reactions  . Penicillins Rash    Has patient had a PCN reaction causing immediate rash, facial/tongue/throat swelling, SOB or lightheadedness with hypotension: unsure Has patient had a PCN reaction causing severe rash involving mucus membranes or skin necrosis: unsure Has patient had a PCN reaction that required hospitalization No Has patient had a PCN reaction occurring within the last 10 years: Yes If all of the above answers are "NO", then may proceed with Cephalosporin use.  Marland Kitchen Zithromax [Azithromycin Dihydrate] Other (See Comments)    ORAL ULCERS     Social History  Substance Use Topics  . Smoking status: Current Every Day Smoker    Packs/day: 1.00    Years: 50.00    Types: Cigarettes  . Smokeless tobacco: Never Used  . Alcohol use No    Family History  Problem Relation Age of Onset  . Diabetes Mother   . Hypertension Father   . Heart disease Father   . Heart attack Father   . Stroke Father     Review of Systems Constitutional: negative Respiratory: negative, some chronic Shortness of breath Cardiovascular: negative for chest pain Gastrointestinal: positive for difficulty swallowing Genitourinary:negative Musculoskeletal:positive for  pain in both shoulders, arms and shoulder blades Neurological: negative  Objective:   Patient Vitals for the past 8 hrs:  BP Temp Temp src Pulse Resp SpO2 Height Weight  11/06/16 2052 (!) 152/72 98.2 F (36.8 C) Oral 99 16 96 % 5\' 2"  (1.575 m) 70.5 kg (155 lb 6.4 oz)   General appearance: alert, cooperative and no distress Head: Normocephalic, without obvious abnormality, atraumatic Eyes: negative findings: EOMI. PRR Neck: supple, symmetrical, trachea midline Lungs: wheezes anterior - bilateral Heart: regular rate and rhythm, S1, S2 normal, no murmur, click, rub or gallop Abdomen: soft, non-tender; bowel sounds normal; no masses,  no organomegaly Extremities: extremities normal, atraumatic, no cyanosis or edema Pulses: 2+ and symmetric to both upper and lower extremities  Imaging Review IMPRESSION: 1. Prevertebral thickening and gas bubbles from C3 to T2 consistent with infectious collection. Patient had similar findings in 2016 when there was esophageal perforation, question recurrent esophageal perforation or chronic fistula. 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 Swelling causes moderate supraglottic laryngeal narrowing. 2. C3-4 ankylosis. C4-C6 anterior fusion. Pseudoarthrosis present at C5-6. 3. Spinal stenosis with cord impingement at C3-4 and C4-5, chronic. Foraminal narrowings  Assessment:   Possible cervical infection Difficulty swallowing, past history of cervical perforation  Plan:   Patient's history and complaints were discussed with Dr. Rolena Infante.  Dr. Erik Obey will also be notified of the patient's admission. A gastrograffin study will bo ordered to rule out esophageal leakage.  She will be placed onto a broad spectrum antibiotic in the form of Rocehpin IV. She will be kept NPO for now.   Hospitalists will be consulted to assist with medical and diabetic management of the patient will in the hospital. Infectious Disease will be consulted tomorrow as  per Dr. Rolena Infante and will likely have a PICC place for long term antibiotics if indicated. [employer] will also be notified for placement of a CTO tomorrow for stabilization.  Arlee Muslim, PA-C  Add:  Patient with recent increase in neck pain. Evaluated in the office and MRI ordered.  Concern for infection and so CT scan ordered Results noted yesterday and patient brought back to hospital for  further work-up and management. Plan:  1. Verterbral osteo: no sign of epidural mass.  No cervical hardware remains.  Will treat osteo with brace (CTO) and abxs.  Will start with broad spectrum IV ABx's and consult ID service to fine tune regimen. No need for surgical debridement at this time 2. Cervical abscess:  gastrografin swallow has been ordered per Dr Redmond Baseman (ENT).  He will update Dr Erik Obey (ENT treating MD from 2016).  Will keep patient NPO until he decided whether surgery is needed.

## 2016-11-06 NOTE — Progress Notes (Signed)
Pt is a direct admit by Dr Rolena Infante for possible neck infection pt is NPO no pain reported orders for antibiotics SCD's are on, will continue to monitor.

## 2016-11-06 NOTE — Consult Note (Addendum)
Medical Consultation   [name]  ZOX:[id number]  DOB: 03-13-1946  DOA: 11/06/2016  PCP: Redmond Baseman, MD    Requesting physician: Dr. Shon Baton  Reason for consultation: Management of DM2   History of Present Illness: [name] is an 71 y.o. female with h/o DM2 abscess of neck, osteomyelitis of C spine, infection of hardware and hardware removal back in Aug of 2016.  This was apparently due to esophageal perforation.  She was treated post I+D with Meropenem, which was changed to levaquin and flagyl and finally treated outpatient with levaquin at that time.  Cultures at that time grew out multiple organisms in the aerobic bottles (none predominate, a note that no staph aureus nor strep pyogenes was isolated in the culture), anaerobic bottles remained no growth.  Treatment appeared to have worked, she was able to be taken off of the levaquin by Dec office follow up with Dr. Daiva Eves, and apparently has done well with regards to the neck for the past 1 year up until 2 weeks ago.  2 weeks ago she had onset of bilateral shoulder and arm pain which has been gradually worsening since onset.  She was seen by Dr. Shon Baton in the office and sent for an MRI of the C spine.  CT scan done earlier today showed prevertebral thickening, gas bubbles from C3 to T2 consistent with infectious collection.  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  She has been admitted to the hospital for management.  She also reports difficulty swallowing as well.  Regarding her DM2 - she currently takes Lantus 25 units QHS only.   Review of Systems:  ROS As per HPI otherwise 10 point review of systems negative.     Past Medical History: Past Medical History:  Diagnosis Date  . Asthma   . Breast cancer (HCC)    right breast  . COPD (chronic obstructive pulmonary disease) (HCC)   . Coronary artery disease   .  Elevated LFTs   . Emphysema lung (HCC)   . ETOH abuse   . H/O atrial fibrillation without current medication    only one time when she had sepsis  . Hyperlipidemia   . Hypertension    hx of but not on any medications  . Myocardial infarction 2000  . OA (osteoarthritis) of knee   . Osteoarthritis   . Tobacco abuse     Past Surgical History: Past Surgical History:  Procedure Laterality Date  . ANTERIOR HIP REVISION Right 10/22/2015   Procedure: RIGHT  HIP REVISION;  Surgeon: Durene Romans, MD;  Location: [facility];  Service: Orthopedics;  Laterality: Right;  . APPLICATION OF WOUND VAC N/A 06/20/2015   Procedure: APPLICATION OF INCISIONAL WOUND VAC;  Surgeon: Venita Lick, MD;  Location: [facility] OR;  Service: Orthopedics;  Laterality: N/A;  . BREAST SURGERY  1991   right mastectomy  . CARDIAC CATHETERIZATION  04/05/2009   EF 60%  . CARDIOVASCULAR STRESS TEST  01/31/2005   EF 58%  . CESAREAN SECTION  '78, '80, '81   x 3  . CORONARY ANGIOPLASTY  08/1998   x2 OF A BIFURCATION OM-1, OM-2 LESION  . CORONARY ANGIOPLASTY WITH STENT PLACEMENT  01/1999   MID FIRST OBTUSE MARGINAL VESSEL  . CORONARY ANGIOPLASTY WITH STENT PLACEMENT  07/1999   STENTING AT THE CRUX OF THE RIGHT CORONARY ARTERY WITH A  3.8MM X TETRA STENT  . DIRECT LARYNGOSCOPY N/A 05/03/2015   Procedure: DIRECT LARYNGOSCOPY;  Surgeon: Flo Shanks, MD;  Location: [facility] OR;  Service: ENT;  Laterality: N/A;  . EYE SURGERY  05/18/2014,06/01/2014   BILATERAL CATARACT S WITH LENS IMPLANTS  . GASTROSTOMY N/A 05/04/2015   Procedure: OPEN GASTROSTOMY WITH TUBE PLACEMENT;  Surgeon: Manus Rudd, MD;  Location: [facility] OR;  Service: General;  Laterality: N/A;  . HARDWARE REMOVAL N/A 05/03/2015   Procedure: HARDWARE REMOVAL;  Surgeon: Venita Lick, MD;  Location: [facility] OR;  Service: Orthopedics;  Laterality: N/A;  . HIP CLOSED REDUCTION Right 04/26/2015   Procedure: CLOSED REDUCTION HIP;  Surgeon: Venita Lick, MD;  Location: [facility];  Service:  Orthopedics;  Laterality: Right;  . HYSTEROSCOPY     D & C  . INCISION AND DRAINAGE ABSCESS N/A 05/03/2015   Procedure: INCISION AND DRAINAGE CERVICAL  ABSCESS AND REMOVAL OF HARDWARE;  Surgeon: Venita Lick, MD;  Location: [facility] OR;  Service: Orthopedics;  Laterality: N/A;  . JOINT REPLACEMENT  08/2011   bilateral hip  . JOINT REPLACEMENT  01/2012   right hip  . MASTECTOMY    . neck fusion  2011  . PELVIC LAPAROSCOPY  2002   RSO-    . RADIOLOGY WITH ANESTHESIA Right 06/28/2015   Procedure: MRI OF CERVICAL SPINE  AND RIGHT HIP  WITH AND WITHOUT CONTRAST    (RADIOLOGY WITH ANESTHESIA);  Surgeon: Medication Radiologist, MD;  Location: [facility] OR;  Service: Radiology;  Laterality: Right;  . RIGID ESOPHAGOSCOPY N/A 05/03/2015   Procedure: RIGID ESOPHAGOSCOPY;  Surgeon: Flo Shanks, MD;  Location[facility];  Service: ENT;  Laterality: N/A;  . TONSILLECTOMY AND ADENOIDECTOMY    . TOTAL HIP ARTHROPLASTY  08/2010   bilat  . VULVECTOMY  1981   partial     Allergies:   Allergies  Allergen Reactions  . Penicillins Rash    Has patient had a PCN reaction causing immediate rash, facial/tongue/throat swelling, SOB or lightheadedness with hypotension: unsure Has patient had a PCN reaction causing severe rash involving mucus membranes or skin necrosis: unsure Has patient had a PCN reaction that required hospitalization No Has patient had a PCN reaction occurring within the last 10 years: Yes If all of the above answers are "NO", then may proceed with Cephalosporin use.  Marland Kitchen Zithromax [Azithromycin Dihydrate] Other (See Comments)    ORAL ULCERS      Social History:  reports that she has been smoking Cigarettes.  She has a 50.00 pack-year smoking history. She has never used smokeless tobacco. She reports that she does not drink alcohol or use drugs.   Family History: Family History  Problem Relation Age of Onset  . Diabetes Mother   . Hypertension Father   . Heart disease Father   . Heart attack  Father   . Stroke Father      Physical Exam: Vitals:   11/06/16 2052  BP: (!) 152/72  Pulse: 99  Resp: 16  Temp: 98.2 F (36.8 C)  TempSrc: Oral  SpO2: 96%  Weight: 70.5 kg (155 lb 6.4 oz)  Height: 5\' 2"  (1.575 m)    Constitutional: ,  Alert and awake, oriented x3, not in any acute distress. Eyes: PERLA, EOMI, irises appear normal, anicteric sclera,  ENMT: external ears and nose appear normal            Lips appears normal, oropharynx mucosa, tongue, posterior pharynx appear normal  Neck: neck appears normal, no masses, normal  ROM, no thyromegaly, no JVD  CVS: S1-S2 clear, no murmur rubs or gallops, no LE edema, normal pedal pulses  Respiratory:  clear to auscultation bilaterally, no wheezing, rales or rhonchi. Respiratory effort normal. No accessory muscle use.  Abdomen: soft nontender, nondistended, normal bowel sounds, no hepatosplenomegaly, no hernias  Musculoskeletal: : no cyanosis, clubbing or edema noted bilaterally Neuro: Cranial nerves II-XII intact, strength, sensation, reflexes Psych: judgement and insight appear normal, stable mood and affect, mental status Skin: no rashes or lesions or ulcers, no induration or nodules    Data reviewed:  I have personally reviewed following labs and imaging studies Labs:  CBC: No results for input(s): WBC, NEUTROABS, HGB, HCT, MCV, PLT in the last 168 hours.  Basic Metabolic Panel: No results for input(s): NA, K, CL, CO2, GLUCOSE, BUN, CREATININE, CALCIUM, MG, PHOS in the last 168 hours. GFR CrCl cannot be calculated (Patient's most recent lab result is older than the maximum 21 days allowed.). Liver Function Tests: No results for input(s): AST, ALT, ALKPHOS, BILITOT, PROT, ALBUMIN in the last 168 hours. No results for input(s): LIPASE, AMYLASE in the last 168 hours. No results for input(s): AMMONIA in the last 168 hours. Coagulation profile No results for input(s): INR, PROTIME in the last 168 hours.  Cardiac Enzymes: No  results for input(s): CKTOTAL, CKMB, CKMBINDEX, TROPONINI in the last 168 hours. BNP: Invalid input(s): POCBNP CBG:  Recent Labs Lab 11/06/16 2237  GLUCAP 148*   D-Dimer No results for input(s): DDIMER in the last 72 hours. Hgb A1c No results for input(s): HGBA1C in the last 72 hours. Lipid Profile No results for input(s): CHOL, HDL, LDLCALC, TRIG, CHOLHDL, LDLDIRECT in the last 72 hours. Thyroid function studies No results for input(s): TSH, T4TOTAL, T3FREE, THYROIDAB in the last 72 hours.  Invalid input(s): FREET3 Anemia work up No results for input(s): VITAMINB12, FOLATE, FERRITIN, TIBC, IRON, RETICCTPCT in the last 72 hours. Urinalysis    Component Value Date/Time   COLORURINE YELLOW 10/15/2015 0950   APPEARANCEUR CLEAR 10/15/2015 0950   LABSPEC 1.005 10/15/2015 0950   PHURINE 6.5 10/15/2015 0950   GLUCOSEU NEGATIVE 10/15/2015 0950   HGBUR NEGATIVE 10/15/2015 0950   BILIRUBINUR NEGATIVE 10/15/2015 0950   KETONESUR NEGATIVE 10/15/2015 0950   PROTEINUR NEGATIVE 10/15/2015 0950   UROBILINOGEN 1.0 04/26/2015 1748   NITRITE NEGATIVE 10/15/2015 0950   LEUKOCYTESUR NEGATIVE 10/15/2015 0950     Microbiology No results found for this or any previous visit (from the past 240 hour(s)).     Inpatient Medications:   Scheduled Meds: . cefTRIAXone (ROCEPHIN)  IV  2 g Intravenous QHS  . gabapentin  600 mg Oral QID  . [START ON 11/07/2016] insulin aspart  0-9 Units Subcutaneous Q4H  . [START ON 11/07/2016] losartan  25 mg Oral Daily  . [START ON 11/07/2016] mometasone-formoterol  2 puff Inhalation BID  . morphine  30 mg Oral QID  . [START ON 11/07/2016] vancomycin  1,000 mg Intravenous Q12H   Continuous Infusions: . sodium chloride Stopped (11/06/16 2321)     Radiological Exams on Admission: Ct Cervical Spine Wo Contrast  Result Date: 11/06/2016 CLINICAL DATA:  Neck pain. Bilateral arm pain and tingling. Difficulty swallowing for 2 weeks EXAM: CT CERVICAL SPINE WITHOUT  CONTRAST TECHNIQUE: Multidetector CT imaging of the cervical spine was performed without intravenous contrast. Multiplanar CT image reconstructions were also generated. COMPARISON:  Cervical spine MRI 06/28/2015 FINDINGS: Alignment: Exaggerated lordosis. Skull base and vertebrae: There is extensive prevertebral thickening extending from C2 to  T2. Ventral bony erosions seen at C7, T1, and T2. At C3-4, gas extends into a chronic screw defect. Scattered bubbles of gas throughout the areas of prevertebral thickening. The supraglottic larynx is narrowed by retropharyngeal swelling, but no critical airway stenosis. Negative for acute fracture. Distorted appearance of the C3, C4, C5, and C6 vertebral bodies related to previous infection and hardware that has been removed. Soft tissues and spinal canal: Prevertebral findings described above. Disc levels: C2-3: Unremarkable. C3-4: Ankylosis. Discitis was seen here previously. There was previously hardware which was removed at the time of prior infection. Bony fusion is solid. Patent foramina. Spinal stenosis from residual ridging is advanced. C4-5: Solid bony fusion. Residual biforaminal stenosis and canal stenosis from residual spurring. C5-6: Corpectomy changes at C5. No solid bony fusion at C5-6. Bilateral uncovertebral spurs with C6 impingement. At least moderate spinal stenosis. C6-7: Mild degenerative changes at the disc. Facet arthropathy with asymmetric bulky right spurring. Advanced right foraminal stenosis. C7-T1:Facet arthropathy with asymmetric advanced right facet spurring. Prominent right foraminal stenosis. Upper chest: Inflammatory changes extend into the upper chest. Ovoid 6 mm nodule in the right upper lobe, not seen in 2016. Other: These results were called by telephone at the time of interpretation on 11/06/2016 at 4:15 pm to Dr. Venita Lick , who verbally acknowledged these results. Reportedly there was recent outside cervical MRI. IMPRESSION: 1.  Prevertebral thickening and gas bubbles from C3 to T2 consistent with infectious collection. Patient had similar findings in 2016 when there was esophageal perforation, question recurrent esophageal perforation or chronic fistula. 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 Swelling causes moderate supraglottic laryngeal narrowing. 2. C3-4 ankylosis. C4-C6 anterior fusion. Pseudoarthrosis present at C5-6. 3. Spinal stenosis with cord impingement at C3-4 and C4-5, chronic. Foraminal narrowings described above. Electronically Signed   By: Marnee Spring M.D.   On: 11/06/2016 16:28    Impression/Recommendations Principal Problem:   Abscess of neck Active Problems:   Diabetes mellitus type 2, insulin dependent (HCC)  1. Abscess of neck - 1. Ortho primary on patient 2. Patient hasnt gotten rocephin yet (RN sitting next to me).  Will go ahead and instead do Merrem and Vanc at least overnight for broadest spectrum of coverage including pseudomonas, anerobes, and staph, at least until ID can see her tomorrow and narrow it down.  Of note despite PCN allergy she has had and tolerated Merrem in the past (specifically meropenem is what they used in 2016 initially in hospital for the exact same problem). 1. Of note she had multiple flora, none predominate, non staph, non strep causing infection from esophageal perf back in 2016.  This grew out of the aerobic bottle, nothing grew out of anaerobic bottle. 3. ID to be consulted tomorrow 2. DM2 - Patient takes Lantus 25 QHS at home, but will be NPO for tonight here 1. Therefore will put patient on sensitive scale SSI Q4H for NPO coverage, and hold her lantus 2. Currently BGL 140   Thank you for this consultation.  Our [facility] hospitalist team will follow the patient with you.   Time Spent: 55 min  Kaevion Sinclair M. D.O. [facility] 11/06/2016, 11:30 PM

## 2016-11-06 NOTE — Progress Notes (Signed)
Addendum Note: I spoke with Dr. Alcario Drought of [facility] who will evaluate and assist with medical and diabetic management of this patient while in the hospital.  I reviewed the case with Dr. Rolena Infante via phone and started the patient on IV ABX in the form of Rocephin.  After speaking with Dr. Alcario Drought, I will also add Vancomycin until further notice.  Will start IV antibiotics tonight and keep NPO for now.   I also called and spoke with Dr. Redmond Baseman (who is covering for Dr. Erik Obey) via phone for advice and recommendations concerning the swallowing study.  He recommended holding on the study until tomorrow as long as the patient is kept NPO for tonight. Dr. Rolena Infante will round tomorrow and Dr. Erik Obey will be notified of the patient's admission by Dr. Redmond Baseman.  Keep NPO for now. Gastrograffin swallowing study ordered for tomorrow to rule out esophageal perforation / leakage.  Arlee Muslim, PA-C

## 2016-11-07 ENCOUNTER — Inpatient Hospital Stay (HOSPITAL_COMMUNITY): Payer: Medicare Other

## 2016-11-07 ENCOUNTER — Inpatient Hospital Stay: Payer: Self-pay

## 2016-11-07 ENCOUNTER — Encounter (HOSPITAL_COMMUNITY): Payer: Self-pay | Admitting: General Practice

## 2016-11-07 DIAGNOSIS — K223 Perforation of esophagus: Secondary | ICD-10-CM

## 2016-11-07 LAB — COMPREHENSIVE METABOLIC PANEL
ALBUMIN: 3.3 g/dL — AB (ref 3.5–5.0)
ALK PHOS: 128 U/L — AB (ref 38–126)
ALT: 10 U/L — AB (ref 14–54)
AST: 15 U/L (ref 15–41)
Anion gap: 12 (ref 5–15)
BILIRUBIN TOTAL: 0.5 mg/dL (ref 0.3–1.2)
BUN: 9 mg/dL (ref 6–20)
CALCIUM: 9.1 mg/dL (ref 8.9–10.3)
CO2: 25 mmol/L (ref 22–32)
Chloride: 96 mmol/L — ABNORMAL LOW (ref 101–111)
Creatinine, Ser: 0.67 mg/dL (ref 0.44–1.00)
GFR calc Af Amer: >60 mL/min (ref >60)
GFR calc non Af Amer: >60 mL/min (ref >60)
GLUCOSE: 133 mg/dL — AB (ref 65–99)
Potassium: 3.8 mmol/L (ref 3.5–5.1)
SODIUM: 133 mmol/L — AB (ref 135–145)
TOTAL PROTEIN: 7.1 g/dL (ref 6.5–8.1)

## 2016-11-07 LAB — GLUCOSE, CAPILLARY
GLUCOSE-CAPILLARY: 104 mg/dL — AB (ref 65–99)
GLUCOSE-CAPILLARY: 110 mg/dL — AB (ref 65–99)
GLUCOSE-CAPILLARY: 130 mg/dL — AB (ref 65–99)
GLUCOSE-CAPILLARY: 99 mg/dL (ref 65–99)
Glucose-Capillary: 127 mg/dL — ABNORMAL HIGH (ref 65–99)
Glucose-Capillary: 131 mg/dL — ABNORMAL HIGH (ref 65–99)

## 2016-11-07 LAB — PROTIME-INR
INR: 1.07
Prothrombin Time: 14 seconds (ref 11.4–15.2)

## 2016-11-07 MED ORDER — HYDROMORPHONE HCL 1 MG/ML IJ SOLN
2.0000 mg | INTRAMUSCULAR | Status: DC | PRN
  Administered 2016-11-07 – 2016-11-08 (×5): 2 mg via INTRAVENOUS
  Filled 2016-11-07 (×5): qty 2

## 2016-11-07 MED ORDER — IOPAMIDOL (ISOVUE-300) INJECTION 61%
150.0000 mL | Freq: Once | INTRAVENOUS | Status: AC | PRN
  Administered 2016-11-07: 75 mL via ORAL

## 2016-11-07 MED ORDER — NICOTINE 21 MG/24HR TD PT24
21.0000 mg | MEDICATED_PATCH | Freq: Every day | TRANSDERMAL | Status: DC
  Administered 2016-11-07 – 2016-11-16 (×10): 21 mg via TRANSDERMAL
  Filled 2016-11-07 (×10): qty 1

## 2016-11-07 MED ORDER — MORPHINE SULFATE (PF) 2 MG/ML IV SOLN
2.0000 mg | INTRAVENOUS | Status: DC | PRN
  Administered 2016-11-07 (×4): 2 mg via INTRAVENOUS
  Filled 2016-11-07 (×4): qty 1

## 2016-11-07 MED ORDER — IOPAMIDOL (ISOVUE-300) INJECTION 61%
INTRAVENOUS | Status: AC
  Filled 2016-11-07: qty 150

## 2016-11-07 NOTE — Progress Notes (Addendum)
Patient seen and examined  Patient admitted for vertebral osteomyelitis without epidural abscess, probable cervical abscess, orthopedics, ENT, infectious disease involved in patient's care Patient CBG appear stable BP also appears to be stable  Continue broad-spectrum antibiotics which will be managed by infectious disease We will follow along to ensure that the patient's diabetes is under control.   hemoglobin A1c 6.4.  Continue sliding scale insulin Will watch closely for hypoglycemic events while she is NPO

## 2016-11-07 NOTE — Progress Notes (Signed)
Orthopedic Tech Progress Note Patient Details:  [name] 1946/07/31 [id number]  Patient ID: [name], female   DOB: 1946/08/04, 71 y.o.   MRN: [id number]   Hildred Priest 11/07/2016, 9:17 AM Called in bio-tech brace order; spoke with Bella Kennedy

## 2016-11-07 NOTE — Progress Notes (Signed)
MD Shumaker visited pt per consult from MD. MD Claiborne Rigg asked to contact MD Rolena Infante that pt may require consult with pain doctor and might need to re-evaluate medicines for IV use since MD [facility] still recommended keeping pt NPO for the time being. RN paged MD Rolena Infante office and spoke with Asencion Partridge who stated will take another look at pt's meds and make some adjustments. Will continue to monitor.

## 2016-11-07 NOTE — Progress Notes (Signed)
Patient ID: [name], female   DOB: 11/27/1945, 71 y.o.   MRN: [id number]  I was contacted by the nurse earlier today. I did increase her IV Morphine and prescribe a Nicoderm patch. I was also informed the pt refused her Cervical brace that was ordered for her by Dr. Rolena Infante. I did inform Dr Rolena Infante of such also.  Ronette Deter,. PAC

## 2016-11-07 NOTE — Care Management Note (Signed)
Case Management Note  Patient Details  Name: [name] MRN: [id number] Date of Birth: 08/29/1946  Subjective/Objective:     Pt admitted with abscess of her neck. She is from home with her spouse.               Action/Plan: Awaiting consults to determine plan at d/c. CM following.  Expected Discharge Date:                  Expected Discharge Plan:     In-House Referral:     Discharge planning Services     Post Acute Care Choice:    Choice offered to:     DME Arranged:    DME Agency:     HH Arranged:    HH Agency:     Status of Service:  In process, will continue to follow  If discussed at Long Length of Stay Meetings, dates discussed:    Additional Comments:  Pollie Friar, RN 11/07/2016, 1:01 PM

## 2016-11-07 NOTE — Consult Note (Signed)
ENT CONSULT:  Reason for Consult: Posterior pharyngeal esophageal infection Referring Physician: Dr. [facility] [name] is an 71 y.o. female.  HPI: The patient is admitted with a complex history of cervical spinal disease, previous cervical fusion and history of previous soft tissue cervical infection. The patient was treated by Dr. Rolena Infante and Dr. Erik Obey in 02/8340 for progressive dysphagia, neck pain and soft tissue infection. 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 The patient subsequently underwent removal of her anterior cervical hardware. She reports a 2 week history of increasing neck pain. She has a medical history of chronic smoking and diabetes.  Past Medical History:  Diagnosis Date  . Asthma   . Breast cancer ([place])    right breast  . COPD (chronic obstructive pulmonary disease) ([place])   . Coronary artery disease   . Elevated LFTs   . Emphysema lung ([place])   . ETOH abuse   . H/O atrial fibrillation without current medication    only one time when she had sepsis  . Hyperlipidemia   . Hypertension    hx of but not on any medications  . Myocardial infarction 2000  . OA (osteoarthritis) of knee   . Osteoarthritis   . Tobacco abuse     Past Surgical History:  Procedure Laterality Date  . ANTERIOR HIP REVISION Right 10/22/2015   Procedure: RIGHT  HIP REVISION;  Surgeon: Paralee Cancel, MD;  Location: [facility];  Service: Orthopedics;  Laterality: Right;  . APPLICATION OF WOUND VAC N/A 06/20/2015   Procedure: APPLICATION OF INCISIONAL WOUND VAC;  Surgeon: Melina Schools, MD;  Location: [place];  Service: Orthopedics;  Laterality: N/A;  . BREAST SURGERY  1991   right mastectomy  . CARDIAC CATHETERIZATION  04/05/2009   EF 60%  . CARDIOVASCULAR STRESS TEST  01/31/2005   EF 58%  . CESAREAN SECTION  '78, '80, '81   x 3  . CORONARY ANGIOPLASTY  08/1998   x2 OF A BIFURCATION OM-1, OM-2  LESION  . CORONARY ANGIOPLASTY WITH STENT PLACEMENT  01/1999   MID FIRST OBTUSE MARGINAL VESSEL  . CORONARY ANGIOPLASTY WITH STENT PLACEMENT  07/1999   STENTING AT THE CRUX OF THE RIGHT CORONARY ARTERY WITH A 3.8MM X 18MM TETRA STENT  . DIRECT LARYNGOSCOPY N/A 05/03/2015   Procedure: DIRECT LARYNGOSCOPY;  Surgeon: Jodi Marble, MD;  Location: [place];  Service: ENT;  Laterality: N/A;  . EYE SURGERY  05/18/2014,06/01/2014   BILATERAL CATARACT S WITH LENS IMPLANTS  . GASTROSTOMY N/A 05/04/2015   Procedure: OPEN GASTROSTOMY WITH TUBE PLACEMENT;  Surgeon: Donnie Mesa, MD;  Location: [place];  Service: General;  Laterality: N/A;  . HARDWARE REMOVAL N/A 05/03/2015   Procedure: HARDWARE REMOVAL;  Surgeon: Melina Schools, MD;  Location: [facility];  Service: Orthopedics;  Laterality: N/A;  . HIP CLOSED REDUCTION Right 04/26/2015   Procedure: CLOSED REDUCTION HIP;  Surgeon: Melina Schools, MD;  Location: [facility];  Service: Orthopedics;  Laterality: Right;  . HYSTEROSCOPY     D & C  . INCISION AND DRAINAGE ABSCESS N/A 05/03/2015   Procedure: INCISION AND DRAINAGE CERVICAL  ABSCESS AND REMOVAL OF HARDWARE;  Surgeon: Melina Schools, MD;  Location: [place];  Service: Orthopedics;  Laterality: N/A;  . JOINT REPLACEMENT  08/2011   bilateral hip  . JOINT REPLACEMENT  01/2012   right hip  . MASTECTOMY    . neck fusion  2011  . PELVIC LAPAROSCOPY  2002  RSO-    . RADIOLOGY WITH ANESTHESIA Right 06/28/2015   Procedure: MRI OF CERVICAL SPINE  AND RIGHT HIP  WITH AND WITHOUT CONTRAST    (RADIOLOGY WITH ANESTHESIA);  Surgeon: Medication Radiologist, MD;  Location: [place];  Service: Radiology;  Laterality: Right;  . RIGID ESOPHAGOSCOPY N/A 05/03/2015   Procedure: RIGID ESOPHAGOSCOPY;  Surgeon: Jodi Marble, MD;  Location: [place];  Service: ENT;  Laterality: N/A;  . TONSILLECTOMY AND ADENOIDECTOMY    . TOTAL HIP ARTHROPLASTY  08/2010   bilat  . VULVECTOMY  1981   partial    Family History  Problem Relation Age of Onset   . Diabetes Mother   . Hypertension Father   . Heart disease Father   . Heart attack Father   . Stroke Father     Social History:  reports that she has been smoking Cigarettes.  She has a 50.00 pack-year smoking history. She has never used smokeless tobacco. She reports that she does not drink alcohol or use drugs.  Allergies:  Allergies  Allergen Reactions  . Penicillins Rash    Has patient had a PCN reaction causing immediate rash, facial/tongue/throat swelling, SOB or lightheadedness with hypotension: unsure Has patient had a PCN reaction causing severe rash involving mucus membranes or skin necrosis: unsure Has patient had a PCN reaction that required hospitalization No Has patient had a PCN reaction occurring within the last 10 years: Yes If all of the above answers are "NO", then may proceed with Cephalosporin use.  . Zithromax [Azithromycin Dihydrate] Other (See Comments)    ORAL ULCERS     Medications: I have reviewed the patient's current medications.  Results for orders placed or performed during the hospital encounter of 11/06/16 (from the past 48 hour(s))  Glucose, capillary     Status: Abnormal   Collection Time: 11/06/16 10:37 PM  Result Value Ref Range   Glucose-Capillary 148 (H) 65 - 99 mg/dL   Comment 1 Notify RN    Comment 2 Document in Chart   CBC with Differential/Platelet     Status: Abnormal   Collection Time: 11/06/16 11:37 PM  Result Value Ref Range   WBC 11.9 (H) 4.0 - 10.5 K/uL   RBC 4.50 3.87 - 5.11 MIL/uL   Hemoglobin 13.1 12.0 - 15.0 g/dL   HCT 37.9 36.0 - 46.0 %   MCV 84.2 78.0 - 100.0 fL   MCH 29.1 26.0 - 34.0 pg   MCHC 34.6 30.0 - 36.0 g/dL   RDW 14.8 11.5 - 15.5 %   Platelets 274 150 - 400 K/uL   Neutrophils Relative % 80 %   Neutro Abs 9.5 (H) 1.7 - 7.7 K/uL   Lymphocytes Relative 16 %   Lymphs Abs 1.9 0.7 - 4.0 K/uL   Monocytes Relative 4 %   Monocytes Absolute 0.5 0.1 - 1.0 K/uL   Eosinophils Relative 0 %   Eosinophils Absolute  0.0 0.0 - 0.7 K/uL   Basophils Relative 0 %   Basophils Absolute 0.0 0.0 - 0.1 K/uL  Comprehensive metabolic panel     Status: Abnormal   Collection Time: 11/06/16 11:37 PM  Result Value Ref Range   Sodium 133 (L) 135 - 145 mmol/L   Potassium 3.8 3.5 - 5.1 mmol/L   Chloride 96 (L) 101 - 111 mmol/L   CO2 25 22 - 32 mmol/L   Glucose, Bld 133 (H) 65 - 99 mg/dL   BUN 9 6 - 20 mg/dL   Creatinine, Ser 0.67 0.44 -  1.00 mg/dL   Calcium 9.1 8.9 - 10.3 mg/dL   Total Protein 7.1 6.5 - 8.1 g/dL   Albumin 3.3 (L) 3.5 - 5.0 g/dL   AST 15 15 - 41 U/L   ALT 10 (L) 14 - 54 U/L   Alkaline Phosphatase 128 (H) 38 - 126 U/L   Total Bilirubin 0.5 0.3 - 1.2 mg/dL   GFR calc non Af Amer >60 >60 mL/min   GFR calc Af Amer >60 >60 mL/min    Comment: (NOTE) The eGFR has been calculated using the CKD EPI equation. This calculation has not been validated in all clinical situations. eGFR's persistently <60 mL/min signify possible Chronic Kidney Disease.    Anion gap 12 5 - 15  Protime-INR     Status: None   Collection Time: 11/06/16 11:37 PM  Result Value Ref Range   Prothrombin Time 14.0 11.4 - 15.2 seconds   INR 1.07   Glucose, capillary     Status: Abnormal   Collection Time: 11/07/16 12:04 AM  Result Value Ref Range   Glucose-Capillary 130 (H) 65 - 99 mg/dL   Comment 1 Notify RN    Comment 2 Document in Chart   Glucose, capillary     Status: Abnormal   Collection Time: 11/07/16  4:13 AM  Result Value Ref Range   Glucose-Capillary 110 (H) 65 - 99 mg/dL   Comment 1 Notify RN    Comment 2 Document in Chart   Glucose, capillary     Status: Abnormal   Collection Time: 11/07/16  9:25 AM  Result Value Ref Range   Glucose-Capillary 104 (H) 65 - 99 mg/dL  Glucose, capillary     Status: None   Collection Time: 11/07/16 11:58 AM  Result Value Ref Range   Glucose-Capillary 99 65 - 99 mg/dL  Glucose, capillary     Status: Abnormal   Collection Time: 11/07/16  4:56 PM  Result Value Ref Range    Glucose-Capillary 127 (H) 65 - 99 mg/dL    Ct Cervical Spine Wo Contrast  Result Date: 11/06/2016 CLINICAL DATA:  Neck pain. Bilateral arm pain and tingling. Difficulty swallowing for 2 weeks EXAM: CT CERVICAL SPINE WITHOUT CONTRAST TECHNIQUE: Multidetector CT imaging of the cervical spine was performed without intravenous contrast. Multiplanar CT image reconstructions were also generated. COMPARISON:  Cervical spine MRI 06/28/2015 FINDINGS: Alignment: Exaggerated lordosis. Skull base and vertebrae: There is extensive prevertebral thickening extending from C2 to T2. Ventral bony erosions seen at C7, T1, and T2. At C3-4, gas extends into a chronic screw defect. Scattered bubbles of gas throughout the areas of prevertebral thickening. The supraglottic larynx is narrowed by retropharyngeal swelling, but no critical airway stenosis. Negative for acute fracture. Distorted appearance of the C3, C4, C5, and C6 vertebral bodies related to previous infection and hardware that has been removed. Soft tissues and spinal canal: Prevertebral findings described above. Disc levels: C2-3: Unremarkable. C3-4: Ankylosis. Discitis was seen here previously. There was previously hardware which was removed at the time of prior infection. Bony fusion is solid. Patent foramina. Spinal stenosis from residual ridging is advanced. C4-5: Solid bony fusion. Residual biforaminal stenosis and canal stenosis from residual spurring. C5-6: Corpectomy changes at C5. No solid bony fusion at C5-6. Bilateral uncovertebral spurs with C6 impingement. At least moderate spinal stenosis. C6-7: Mild degenerative changes at the disc. Facet arthropathy with asymmetric bulky right spurring. Advanced right foraminal stenosis. C7-T1:Facet arthropathy with asymmetric advanced right facet spurring. Prominent right foraminal stenosis. Upper chest: Inflammatory  changes extend into the upper chest. Ovoid 6 mm nodule in the right upper lobe, not seen in 2016. Other:  These results were called by telephone at the time of interpretation on 11/06/2016 at 4:15 pm to Dr. Melina Schools , who verbally acknowledged these results. Reportedly there was recent outside cervical MRI. IMPRESSION: 1. Prevertebral thickening and gas bubbles from C3 to T2 consistent with infectious collection. Patient had similar findings in 2016 when there was esophageal perforation, question recurrent esophageal perforation or chronic fistula. 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 Swelling causes moderate supraglottic laryngeal narrowing. 2. C3-4 ankylosis. C4-C6 anterior fusion. Pseudoarthrosis present at C5-6. 3. Spinal stenosis with cord impingement at C3-4 and C4-5, chronic. Foraminal narrowings described above. Electronically Signed   By: Monte Fantasia M.D.   On: 11/06/2016 16:28   Dg Esophagus W/water Sol Cm  Result Date: 11/07/2016 CLINICAL DATA:  Complicated past medical history with anterior lower cervical fusion remotely and development of retropharyngeal abscess in 2016. Patient underwent removal of cervical fusion hardware and ENT evaluation at that time revealed a defect in the posterior wall of the esophagus at the C5-6 level. EXAM: ESOPHOGRAM/BARIUM SWALLOW TECHNIQUE: Single contrast examination was performed using water-soluble contrast. FLUOROSCOPY TIME:  Fluoroscopy Time:  1 minutes and 18 seconds Radiation Exposure Index (if provided by the fluoroscopic device): Number of Acquired Spot Images: 0 COMPARISON:  02/15/2016.  06/21/2015. FINDINGS: As seen on prior imaging studies, the patient has a moderate-sized posterior/Zenker's diverticulum. The posterior wall of the diverticulum appears to be fixed and immobile. There is debris within the diverticulum. The patient was given multiple swallows of water soluble contrast, and no extravasation of contrast from the posterior wall of the hypopharynx or proximal esophagus was observed. No evidence for extravasation from the  diverticulum itself. Soft tissue gas identified in the retropharyngeal soft tissues. The patient had an episode of relatively vigorous coughing during the exam, but close inspection of the larynx and proximal trachea revealed no evidence of aspiration. IMPRESSION: 1. 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 Posterior wall of the diverticulum is fixed, likely due to scarring from prior cervical spine surgery. No contrast extravasation could be demonstrated from the posterior hypopharynx, posterior esophagus, or diverticulum on today's study. 2. Gas collection identified in the retropharyngeal soft tissues, as seen on the recent CT scan. Electronically Signed   By: Misty Stanley M.D.   On: 11/07/2016 08:57    ROS:ROS 12 systems reviewed and negative except as stated in HPI   Blood pressure (!) 152/75, pulse 72, temperature 98.2 F (36.8 C), temperature source Oral, resp. rate 16, height '5\' 2"'$  (1.575 m), weight 70.5 kg (155 lb 6.4 oz), SpO2 97 %.  PHYSICAL EXAM: General appearance - patient alert and oriented in mild distress from chronic neck pain. Mouth - mucous membranes moist, pharynx normal without lesions and No evidence of posterior oropharyngeal swelling or purulent discharge. Neck - supple, no significant adenopathy, no erythema or evidence of abscess. Significant scarring from previous surgical procedures.  Studies Reviewed: Noncontrast CT scan of the spine shows gas bubbles and soft tissue thickening in the prevertebral space consistent with infection. Swallow study shows posterior pharynx esophageal collection consistent with possible diverticulum, this is discrete without evidence of extravasation into the soft tissues. Study compared to previous swallow in October 2016 with similar findings.  Assessment/Plan: The patient presents with increasing neck pain, low-grade fever and elevated white count consistent with infection. Spinal CT scan shows soft tissue  swelling and gas  bubbles in the prevertebral space with spinal changes consistent with possible osteomyelitis. There is no evidence of extravasation of contrast media on swallow study and this compares to previous study in October 2016 with evidence of a posterior outpouching consistent with a diverticulum versus scar tissue and no extravasation. These findings were discussed with Dr. Rolena Infante, patient to remain on intravenous antibiotics and NPO at this time. We will schedule her for a contrast-enhanced CT scan looking for loculated abscess which might benefit from drainage. If current infection is not from leakage from a pharyngo-esophageal fistula, the patient may be able to resume oral intake. She has had a previous gastrostomy tube and may benefit from replacement for adequate nutrition and medication. Would not recommend surgical intervention at this time, will review upcoming CT scan with further recommendations.     [place], Ebba Goll 11/07/2016, 5:19 PM

## 2016-11-08 ENCOUNTER — Encounter (HOSPITAL_COMMUNITY)
Admission: AD | Disposition: A | Discharge status: Home Health | Payer: Self-pay | Source: Ambulatory Visit | Attending: Orthopedic Surgery

## 2016-11-08 ENCOUNTER — Inpatient Hospital Stay (HOSPITAL_COMMUNITY): Payer: Medicare Other | Admitting: Certified Registered Nurse Anesthetist

## 2016-11-08 ENCOUNTER — Inpatient Hospital Stay (HOSPITAL_COMMUNITY): Payer: Medicare Other

## 2016-11-08 HISTORY — PX: RADICAL NECK DISSECTION: SHX2284

## 2016-11-08 LAB — COMPREHENSIVE METABOLIC PANEL
ALBUMIN: 3 g/dL — AB (ref 3.5–5.0)
ALT: 10 U/L — ABNORMAL LOW (ref 14–54)
ANION GAP: 12 (ref 5–15)
AST: 17 U/L (ref 15–41)
Alkaline Phosphatase: 128 U/L — ABNORMAL HIGH (ref 38–126)
BUN: 7 mg/dL (ref 6–20)
CHLORIDE: 97 mmol/L — AB (ref 101–111)
CO2: 25 mmol/L (ref 22–32)
Calcium: 9.1 mg/dL (ref 8.9–10.3)
Creatinine, Ser: 0.53 mg/dL (ref 0.44–1.00)
GFR calc non Af Amer: >60 mL/min (ref >60)
GLUCOSE: 143 mg/dL — AB (ref 65–99)
POTASSIUM: 3.2 mmol/L — AB (ref 3.5–5.1)
SODIUM: 134 mmol/L — AB (ref 135–145)
Total Bilirubin: 0.8 mg/dL (ref 0.3–1.2)
Total Protein: 7.3 g/dL (ref 6.5–8.1)

## 2016-11-08 LAB — CBC
HCT: 38.3 % (ref 36.0–46.0)
Hemoglobin: 13.4 g/dL (ref 12.0–15.0)
MCH: 29.1 pg (ref 26.0–34.0)
MCHC: 35 g/dL (ref 30.0–36.0)
MCV: 83.3 fL (ref 78.0–100.0)
PLATELETS: 299 10*3/uL (ref 150–400)
RBC: 4.6 MIL/uL (ref 3.87–5.11)
RDW: 14.3 % (ref 11.5–15.5)
WBC: 8.7 10*3/uL (ref 4.0–10.5)

## 2016-11-08 LAB — GLUCOSE, CAPILLARY
GLUCOSE-CAPILLARY: 145 mg/dL — AB (ref 65–99)
GLUCOSE-CAPILLARY: 137 mg/dL — AB (ref 65–99)
Glucose-Capillary: 138 mg/dL — ABNORMAL HIGH (ref 65–99)
Glucose-Capillary: 144 mg/dL — ABNORMAL HIGH (ref 65–99)
Glucose-Capillary: 168 mg/dL — ABNORMAL HIGH (ref 65–99)

## 2016-11-08 LAB — SEDIMENTATION RATE: Sed Rate: 52 mm/hr — ABNORMAL HIGH (ref 0–22)

## 2016-11-08 LAB — C-REACTIVE PROTEIN: CRP: 1.9 mg/dL — ABNORMAL HIGH (ref <1.0)

## 2016-11-08 SURGERY — DISSECTION, NECK, RADICAL
Anesthesia: General | Site: Neck

## 2016-11-08 MED ORDER — SUGAMMADEX SODIUM 200 MG/2ML IV SOLN
INTRAVENOUS | Status: DC | PRN
  Administered 2016-11-08: 200 mg via INTRAVENOUS

## 2016-11-08 MED ORDER — LIDOCAINE-EPINEPHRINE 2 %-1:100000 IJ SOLN
INTRAMUSCULAR | Status: AC
  Filled 2016-11-08: qty 1

## 2016-11-08 MED ORDER — DEXAMETHASONE SODIUM PHOSPHATE 10 MG/ML IJ SOLN
INTRAMUSCULAR | Status: AC
  Filled 2016-11-08: qty 1

## 2016-11-08 MED ORDER — POTASSIUM CHLORIDE IN NACL 40-0.9 MEQ/L-% IV SOLN
INTRAVENOUS | Status: DC
  Administered 2016-11-08 – 2016-11-12 (×7): 75 mL/h via INTRAVENOUS
  Filled 2016-11-08 (×7): qty 1000

## 2016-11-08 MED ORDER — PROPOFOL 10 MG/ML IV BOLUS
INTRAVENOUS | Status: AC
  Filled 2016-11-08: qty 20

## 2016-11-08 MED ORDER — LIDOCAINE-EPINEPHRINE 1 %-1:100000 IJ SOLN
INTRAMUSCULAR | Status: DC | PRN
Start: 2016-11-08 — End: 2016-11-08
  Administered 2016-11-08: 2 mL

## 2016-11-08 MED ORDER — LIDOCAINE HCL (CARDIAC) 20 MG/ML IV SOLN
INTRAVENOUS | Status: DC | PRN
  Administered 2016-11-08: 40 mg via INTRAVENOUS

## 2016-11-08 MED ORDER — 0.9 % SODIUM CHLORIDE (POUR BTL) OPTIME
TOPICAL | Status: DC | PRN
  Administered 2016-11-08: 1000 mL

## 2016-11-08 MED ORDER — SUCCINYLCHOLINE CHLORIDE 200 MG/10ML IV SOSY
PREFILLED_SYRINGE | INTRAVENOUS | Status: AC
  Filled 2016-11-08: qty 10

## 2016-11-08 MED ORDER — PANTOPRAZOLE SODIUM 40 MG IV SOLR
40.0000 mg | INTRAVENOUS | Status: AC
  Administered 2016-11-08 – 2016-11-11 (×4): 40 mg via INTRAVENOUS
  Filled 2016-11-08 (×4): qty 40

## 2016-11-08 MED ORDER — IOPAMIDOL (ISOVUE-300) INJECTION 61%
INTRAVENOUS | Status: AC
  Administered 2016-11-08: 75 mL
  Filled 2016-11-08: qty 75

## 2016-11-08 MED ORDER — OXYCODONE HCL 5 MG/5ML PO SOLN: 5.0000 mg | Freq: Once | ORAL | Status: DC | PRN

## 2016-11-08 MED ORDER — MIDAZOLAM HCL 5 MG/5ML IJ SOLN
INTRAMUSCULAR | Status: DC | PRN
  Administered 2016-11-08: 2 mg via INTRAVENOUS

## 2016-11-08 MED ORDER — HYDRALAZINE HCL 20 MG/ML IJ SOLN: 5.0000 mg | Freq: Four times a day (QID) | INTRAMUSCULAR | Status: DC | PRN

## 2016-11-08 MED ORDER — OXYCODONE HCL 5 MG PO TABS: 5.0000 mg | ORAL_TABLET | Freq: Once | ORAL | Status: DC | PRN

## 2016-11-08 MED ORDER — LORAZEPAM 2 MG/ML IJ SOLN
0.5000 mg | Freq: Three times a day (TID) | INTRAMUSCULAR | Status: DC | PRN
Start: 2016-11-08 — End: 2016-11-11
  Administered 2016-11-08 – 2016-11-09 (×3): 0.5 mg via INTRAVENOUS
  Filled 2016-11-08 (×4): qty 1

## 2016-11-08 MED ORDER — ONDANSETRON HCL 4 MG/2ML IJ SOLN
INTRAMUSCULAR | Status: AC
  Filled 2016-11-08: qty 2

## 2016-11-08 MED ORDER — HYDROMORPHONE HCL 1 MG/ML IJ SOLN
2.0000 mg | INTRAMUSCULAR | Status: DC | PRN
  Administered 2016-11-08 – 2016-11-11 (×25): 2 mg via INTRAVENOUS
  Filled 2016-11-08 (×26): qty 2

## 2016-11-08 MED ORDER — FENTANYL CITRATE (PF) 100 MCG/2ML IJ SOLN
INTRAMUSCULAR | Status: DC | PRN
  Administered 2016-11-08 (×4): 50 ug via INTRAVENOUS

## 2016-11-08 MED ORDER — FENTANYL CITRATE (PF) 100 MCG/2ML IJ SOLN
INTRAMUSCULAR | Status: AC
  Administered 2016-11-08: 50 ug via INTRAVENOUS
  Filled 2016-11-08: qty 2

## 2016-11-08 MED ORDER — LIDOCAINE-EPINEPHRINE (PF) 1 %-1:200000 IJ SOLN
INTRAMUSCULAR | Status: AC
  Filled 2016-11-08: qty 30

## 2016-11-08 MED ORDER — ONDANSETRON HCL 4 MG/2ML IJ SOLN
INTRAMUSCULAR | Status: DC | PRN
  Administered 2016-11-08: 4 mg via INTRAVENOUS

## 2016-11-08 MED ORDER — FENTANYL CITRATE (PF) 100 MCG/2ML IJ SOLN
25.0000 ug | INTRAMUSCULAR | Status: DC | PRN
  Administered 2016-11-08 (×3): 50 ug via INTRAVENOUS

## 2016-11-08 MED ORDER — LACTATED RINGERS IV SOLN
INTRAVENOUS | Status: DC
  Administered 2016-11-08: 13:00:00 via INTRAVENOUS

## 2016-11-08 MED ORDER — FENTANYL CITRATE (PF) 100 MCG/2ML IJ SOLN
INTRAMUSCULAR | Status: AC
  Filled 2016-11-08: qty 2

## 2016-11-08 MED ORDER — MIDAZOLAM HCL 2 MG/2ML IJ SOLN
INTRAMUSCULAR | Status: AC
  Filled 2016-11-08: qty 2

## 2016-11-08 MED ORDER — FENTANYL CITRATE (PF) 100 MCG/2ML IJ SOLN
25.0000 ug | INTRAMUSCULAR | Status: DC | PRN
Start: 2016-11-08 — End: 2016-11-08

## 2016-11-08 MED ORDER — METHOCARBAMOL 1000 MG/10ML IJ SOLN
500.0000 mg | Freq: Four times a day (QID) | INTRAVENOUS | Status: DC | PRN
  Administered 2016-11-08 – 2016-11-14 (×12): 500 mg via INTRAVENOUS
  Filled 2016-11-08 (×25): qty 5

## 2016-11-08 MED ORDER — FENTANYL CITRATE (PF) 100 MCG/2ML IJ SOLN
50.0000 ug | Freq: Once | INTRAMUSCULAR | Status: AC
  Administered 2016-11-08: 50 ug via INTRAVENOUS

## 2016-11-08 MED ORDER — LIDOCAINE 2% (20 MG/ML) 5 ML SYRINGE
INTRAMUSCULAR | Status: AC
  Filled 2016-11-08: qty 5

## 2016-11-08 MED ORDER — PROPOFOL 10 MG/ML IV BOLUS
INTRAVENOUS | Status: DC | PRN
  Administered 2016-11-08: 120 mg via INTRAVENOUS

## 2016-11-08 MED ORDER — ROCURONIUM BROMIDE 100 MG/10ML IV SOLN
INTRAVENOUS | Status: DC | PRN
  Administered 2016-11-08: 40 mg via INTRAVENOUS

## 2016-11-08 SURGICAL SUPPLY — 52 items
ATTRACTOMAT 16X20 MAGNETIC DRP (DRAPES) IMPLANT
BLADE SURG 10 STRL SS (BLADE) IMPLANT
BLADE SURG 15 STRL LF DISP TIS (BLADE) ×1 IMPLANT
BLADE SURG 15 STRL SS (BLADE) ×3
BNDG GAUZE ELAST 4 BULKY (GAUZE/BANDAGES/DRESSINGS) ×2 IMPLANT
CANISTER SUCT 3000ML PPV (MISCELLANEOUS) ×3 IMPLANT
CLEANER TIP ELECTROSURG 2X2 (MISCELLANEOUS) ×3 IMPLANT
CORDS BIPOLAR (ELECTRODE) ×3 IMPLANT
COVER SURGICAL LIGHT HANDLE (MISCELLANEOUS) ×3 IMPLANT
DRAIN CHANNEL 15F RND FF W/TCR (WOUND CARE) IMPLANT
DRAIN SNY 10 ROU (WOUND CARE) ×3 IMPLANT
DRAPE PROXIMA HALF (DRAPES) IMPLANT
DRAPE STERI IOBAN 125X83 (DRAPES) ×2 IMPLANT
ELECT COATED BLADE 2.86 ST (ELECTRODE) ×3 IMPLANT
ELECT REM PT RETURN 9FT ADLT (ELECTROSURGICAL) ×3
ELECTRODE REM PT RTRN 9FT ADLT (ELECTROSURGICAL) ×1 IMPLANT
EVACUATOR SILICONE 100CC (DRAIN) ×3 IMPLANT
FORCEPS BIPOLAR SPETZLER 8 1.0 (NEUROSURGERY SUPPLIES) ×3 IMPLANT
GAUZE SPONGE 4X4 12PLY STRL (GAUZE/BANDAGES/DRESSINGS) ×2 IMPLANT
GAUZE SPONGE 4X4 16PLY XRAY LF (GAUZE/BANDAGES/DRESSINGS) ×3 IMPLANT
GLOVE BIOGEL M 7.0 STRL (GLOVE) ×3 IMPLANT
GOWN STRL REUS W/ TWL LRG LVL3 (GOWN DISPOSABLE) ×2 IMPLANT
GOWN STRL REUS W/ TWL XL LVL3 (GOWN DISPOSABLE) IMPLANT
GOWN STRL REUS W/TWL LRG LVL3 (GOWN DISPOSABLE) ×6
GOWN STRL REUS W/TWL XL LVL3 (GOWN DISPOSABLE)
KIT BASIN OR (CUSTOM PROCEDURE TRAY) ×3 IMPLANT
KIT ROOM TURNOVER OR (KITS) ×3 IMPLANT
LOCATOR NERVE 3 VOLT (DISPOSABLE) IMPLANT
NDL HYPO 25GX1X1/2 BEV (NEEDLE) ×1 IMPLANT
NEEDLE HYPO 25GX1X1/2 BEV (NEEDLE) ×3 IMPLANT
NS IRRIG 1000ML POUR BTL (IV SOLUTION) ×3 IMPLANT
PAD ARMBOARD 7.5X6 YLW CONV (MISCELLANEOUS) ×6 IMPLANT
PENCIL BUTTON HOLSTER BLD 10FT (ELECTRODE) ×3 IMPLANT
SPONGE INTESTINAL PEANUT (DISPOSABLE) IMPLANT
SPONGE LAP 18X18 X RAY DECT (DISPOSABLE) ×3 IMPLANT
STAPLER VISISTAT 35W (STAPLE) ×3 IMPLANT
SUT CHROMIC 3 0 SH 27 (SUTURE) IMPLANT
SUT CHROMIC 5 0 P 3 (SUTURE) IMPLANT
SUT ETHILON 2 0 FS 18 (SUTURE) IMPLANT
SUT SILK 2 0 (SUTURE)
SUT SILK 2 0 SH CR/8 (SUTURE) ×3 IMPLANT
SUT SILK 2-0 18XBRD TIE 12 (SUTURE) IMPLANT
SUT SILK 3 0 REEL (SUTURE) ×3 IMPLANT
SUT SILK 4 0 (SUTURE)
SUT SILK 4-0 18XBRD TIE 12 (SUTURE) IMPLANT
SUT VIC AB 3-0 FS2 27 (SUTURE) IMPLANT
SUT VIC AB 3-0 SH 18 (SUTURE) IMPLANT
SYRINGE TOOMEY DISP (SYRINGE) ×2 IMPLANT
TAPE CLOTH 4X10 WHT NS (GAUZE/BANDAGES/DRESSINGS) ×2 IMPLANT
TOWEL OR 17X24 6PK STRL BLUE (TOWEL DISPOSABLE) IMPLANT
TRAY ENT MC OR (CUSTOM PROCEDURE TRAY) ×3 IMPLANT
TRAY FOLEY CATH 14FRSI W/METER (CATHETERS) IMPLANT

## 2016-11-08 NOTE — Brief Op Note (Signed)
11/06/2016 - 11/08/2016  2:25 PM  PATIENT:  [name]  71 y.o. female  PRE-OPERATIVE DIAGNOSIS:  CERVICAL SPINAL ABSCESS  POST-OPERATIVE DIAGNOSIS:  CERVICAL SPINAL ABSCESS  PROCEDURE:  Procedure(s): INCISION AND DRAINAGE OF NECK ABSCESS (N/A)  SURGEON:  Surgeon(s) and Role:    * Jerrell [facility], MD - Primary    * Melina Schools, MD - Assisting  PHYSICIAN ASSISTANT:   ASSISTANTS: Dr. Rolena Infante   ANESTHESIA:   general  EBL:  No intake/output data recorded.  BLOOD ADMINISTERED:none  DRAINS: Penrose drain in the left neck   LOCAL MEDICATIONS USED:  LIDOCAINE  and Amount: 2 ml  SPECIMEN:  Source of Specimen:  neck  DISPOSITION OF SPECIMEN:  microbiology  COUNTS:  YES  TOURNIQUET:  * No tourniquets in log *  DICTATION: .Other Dictation: Dictation Number [id number]  PLAN OF CARE: Admit to inpatient   PATIENT DISPOSITION:  PACU - hemodynamically stable.   Delay start of Pharmacological VTE agent (>24hrs) due to surgical blood loss or risk of bleeding: no

## 2016-11-08 NOTE — Progress Notes (Signed)
[facility] PROGRESS NOTE  Rachel COLVERT [name]:[id number] DOB: 1945/09/20 DOA: 11/06/2016   PCP: Redmond Baseman, MD     Assessment/Plan: Principal Problem:   Abscess of neck Active Problems:   Diabetes mellitus type 2, insulin dependent (HCC)   71 y.o. female with h/o DM2 abscess of neck, osteomyelitis of C spine, infection of hardware and hardware removal back in Aug of 2016.  This was apparently due to esophageal perforation.  She was treated post I+D with Meropenem,.The patient is admitted with a complex history of cervical spinal disease, previous cervical fusion and history of previous soft tissue cervical infection. The patient was treated by Dr. Shon Baton and Dr. Lazarus Salines in 04/2015 for progressive dysphagia, neck pain and soft tissue infection. 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 [facility] consulted to manage diabetes   Assessment and plan DM2 - Patient takes Lantus 25 QHS at home, nothing by mouth since admission. Therefore placed on sensitive scale SSI Q4H for NPO coverage, and hold her lantus  hypertension-hold Cozaar blood pressure stable  Hypokalemia-replete  Anxiety , on Xanax at home, will start as needed Ativan to prevent withdrawal  COPD-continue home inhalers  History of atrial fibrillation in the setting of sepsis -Continue monitoring on telemetry  Hypokalemia-replete  Infection of prevertebral space-being managed by ENT and orthopedics, infectious disease I&D of neck abscess and placement of ng feeding tube 2/24   DVT prophylaxsis SCDs  Code Status:  Full code     Family Communication: Discussed in detail with the patient, all imaging results, lab results explained to the patient   Disposition Plan: ENT, orthopedics      Consultants:  [facility]  Procedures:  None   Antibiotics: Anti-infectives    Start     Dose/Rate Route Frequency Ordered Stop   11/07/16 0000  vancomycin (VANCOCIN) IVPB 1000 mg/200 mL premix     1,000 mg 200 mL/hr over 60 Minutes Intravenous Every 12 hours 11/06/16 2321     11/07/16 0000  metroNIDAZOLE (FLAGYL) IVPB 500 mg  Status:  Discontinued     500 mg 100 mL/hr over 60 Minutes Intravenous Every 8 hours 11/06/16 2340 11/06/16 2347   11/07/16 0000  meropenem (MERREM) 1 g in sodium chloride 0.9 % 100 mL IVPB     1 g 200 mL/hr over 30 Minutes Intravenous Every 8 hours 11/06/16 2358     11/06/16 2330  cefTRIAXone (ROCEPHIN) 2 g in dextrose 5 % 50 mL IVPB  Status:  Discontinued     2 g 100 mL/hr over 30 Minutes Intravenous Daily at bedtime 11/06/16 2320 11/06/16 2348   11/06/16 2300  cefTRIAXone (ROCEPHIN) 1 g in dextrose 5 % 50 mL IVPB  Status:  Discontinued     1 g 100 mL/hr over 30 Minutes Intravenous Every 12 hours 11/06/16 2211 11/06/16 2320         HPI/Subjective: No complaints   Objective: Vitals:   11/07/16 1400 11/07/16 1700 11/07/16 2100 11/08/16 0105  BP: (!) 157/68 135/79 (!) 146/71 139/78  Pulse: 76 86 83 77  Resp: 18 16 15 16   Temp: 98.1 F (36.7 C) 98.2 F (36.8 C) 97.9 F (36.6 C) 98.1 F (36.7 C)  TempSrc: Oral Oral Oral Oral  SpO2: 96% 95% 93% 95%  Weight:      Height:  No intake or output data in the 24 hours ending 11/08/16 0837  Exam:  Examination:  General exam: Appears calm and comfortable  Respiratory system: Clear to auscultation. Respiratory effort normal. Cardiovascular system: S1 & S2 heard, RRR. No JVD, murmurs, rubs, gallops or clicks. No pedal edema. Gastrointestinal system: Abdomen is nondistended, soft and nontender. No organomegaly or masses felt. Normal bowel sounds heard. Central nervous system: Alert and oriented. No focal neurological deficits. Extremities: Symmetric 5 x 5 power. Skin: No rashes, lesions or  ulcers Psychiatry: Judgement and insight appear normal. Mood & affect appropriate.     Data Reviewed: I have personally reviewed following labs and imaging studies  Micro Results No results found for this or any previous visit (from the past 240 hour(s)).  Radiology Reports Ct Cervical Spine Wo Contrast  Result Date: 11/06/2016 CLINICAL DATA:  Neck pain. Bilateral arm pain and tingling. Difficulty swallowing for 2 weeks EXAM: CT CERVICAL SPINE WITHOUT CONTRAST TECHNIQUE: Multidetector CT imaging of the cervical spine was performed without intravenous contrast. Multiplanar CT image reconstructions were also generated. COMPARISON:  Cervical spine MRI 06/28/2015 FINDINGS: Alignment: Exaggerated lordosis. Skull base and vertebrae: There is extensive prevertebral thickening extending from C2 to T2. Ventral bony erosions seen at C7, T1, and T2. At C3-4, gas extends into a chronic screw defect. Scattered bubbles of gas throughout the areas of prevertebral thickening. The supraglottic larynx is narrowed by retropharyngeal swelling, but no critical airway stenosis. Negative for acute fracture. Distorted appearance of the C3, C4, C5, and C6 vertebral bodies related to previous infection and hardware that has been removed. Soft tissues and spinal canal: Prevertebral findings described above. Disc levels: C2-3: Unremarkable. C3-4: Ankylosis. Discitis was seen here previously. There was previously hardware which was removed at the time of prior infection. Bony fusion is solid. Patent foramina. Spinal stenosis from residual ridging is advanced. C4-5: Solid bony fusion. Residual biforaminal stenosis and canal stenosis from residual spurring. C5-6: Corpectomy changes at C5. No solid bony fusion at C5-6. Bilateral uncovertebral spurs with C6 impingement. At least moderate spinal stenosis. C6-7: Mild degenerative changes at the disc. Facet arthropathy with asymmetric bulky right spurring. Advanced right foraminal  stenosis. C7-T1:Facet arthropathy with asymmetric advanced right facet spurring. Prominent right foraminal stenosis. Upper chest: Inflammatory changes extend into the upper chest. Ovoid 6 mm nodule in the right upper lobe, not seen in 2016. Other: These results were called by telephone at the time of interpretation on 11/06/2016 at 4:15 pm to Dr. Venita Lick , who verbally acknowledged these results. Reportedly there was recent outside cervical MRI. IMPRESSION: 1. Prevertebral thickening and gas bubbles from C3 to T2 consistent with infectious collection. Patient had similar findings in 2016 when there was esophageal perforation, question recurrent esophageal perforation or chronic fistula. 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 Swelling causes moderate supraglottic laryngeal narrowing. 2. C3-4 ankylosis. C4-C6 anterior fusion. Pseudoarthrosis present at C5-6. 3. Spinal stenosis with cord impingement at C3-4 and C4-5, chronic. Foraminal narrowings described above. Electronically Signed   By: Marnee Spring M.D.   On: 11/06/2016 16:28   Dg Esophagus W/water Sol Cm  Result Date: 11/07/2016 CLINICAL DATA:  Complicated past medical history with anterior lower cervical fusion remotely and development of retropharyngeal abscess in 2016. Patient underwent removal of cervical fusion hardware and ENT evaluation at that time revealed a defect in the posterior wall of the esophagus at the C5-6 level. EXAM: ESOPHOGRAM/BARIUM SWALLOW TECHNIQUE: Single contrast examination was performed using water-soluble contrast. FLUOROSCOPY TIME:  Fluoroscopy Time:  1 minutes and 18 seconds Radiation Exposure Index (if provided by the fluoroscopic device): Number of Acquired Spot Images: 0 COMPARISON:  02/15/2016.  06/21/2015. FINDINGS: As seen on prior imaging studies, the patient has a moderate-sized posterior/Zenker's diverticulum. The posterior wall of the diverticulum appears to be fixed and immobile. There is debris  within the diverticulum. The patient was given multiple swallows of water soluble contrast, and no extravasation of contrast from the posterior wall of the hypopharynx or proximal esophagus was observed. No evidence for extravasation from the diverticulum itself. Soft tissue gas identified in the retropharyngeal soft tissues. The patient had an episode of relatively vigorous coughing during the exam, but close inspection of the larynx and proximal trachea revealed no evidence of aspiration. IMPRESSION: 1. 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 Posterior wall of the diverticulum is fixed, likely due to scarring from prior cervical spine surgery. No contrast extravasation could be demonstrated from the posterior hypopharynx, posterior esophagus, or diverticulum on today's study. 2. Gas collection identified in the retropharyngeal soft tissues, as seen on the recent CT scan. Electronically Signed   By: Kennith Center M.D.   On: 11/07/2016 08:57     CBC  Recent Labs Lab 11/06/16 2337 11/08/16 0241  WBC 11.9* 8.7  HGB 13.1 13.4  HCT 37.9 38.3  PLT 274 299  MCV 84.2 83.3  MCH 29.1 29.1  MCHC 34.6 35.0  RDW 14.8 14.3  LYMPHSABS 1.9  --   MONOABS 0.5  --   EOSABS 0.0  --   BASOSABS 0.0  --     Chemistries   Recent Labs Lab 11/06/16 2337 11/08/16 0241  NA 133* 134*  K 3.8 3.2*  CL 96* 97*  CO2 25 25  GLUCOSE 133* 143*  BUN 9 7  CREATININE 0.67 0.53  CALCIUM 9.1 9.1  AST 15 17  ALT 10* 10*  ALKPHOS 128* 128*  BILITOT 0.5 0.8   ------------------------------------------------------------------------------------------------------------------ estimated creatinine clearance is 61.9 mL/min (by C-G formula based on SCr of 0.53 mg/dL). ------------------------------------------------------------------------------------------------------------------ No results for input(s): HGBA1C in the last 72  hours. ------------------------------------------------------------------------------------------------------------------ No results for input(s): CHOL, HDL, LDLCALC, TRIG, CHOLHDL, LDLDIRECT in the last 72 hours. ------------------------------------------------------------------------------------------------------------------ No results for input(s): TSH, T4TOTAL, T3FREE, THYROIDAB in the last 72 hours.  Invalid input(s): FREET3 ------------------------------------------------------------------------------------------------------------------ No results for input(s): VITAMINB12, FOLATE, FERRITIN, TIBC, IRON, RETICCTPCT in the last 72 hours.  Coagulation profile  Recent Labs Lab 11/06/16 2337  INR 1.07    No results for input(s): DDIMER in the last 72 hours.  Cardiac Enzymes No results for input(s): CKMB, TROPONINI, MYOGLOBIN in the last 168 hours.  Invalid input(s): CK ------------------------------------------------------------------------------------------------------------------ Invalid input(s): POCBNP   CBG:  Recent Labs Lab 11/07/16 1656 11/07/16 1958 11/08/16 0017 11/08/16 0353 11/08/16 0746  GLUCAP 127* 131* 168* 145* 138*       Studies: Ct Cervical Spine Wo Contrast  Result Date: 11/06/2016 CLINICAL DATA:  Neck pain. Bilateral arm pain and tingling. Difficulty swallowing for 2 weeks EXAM: CT CERVICAL SPINE WITHOUT CONTRAST TECHNIQUE: Multidetector CT imaging of the cervical spine was performed without intravenous contrast. Multiplanar CT image reconstructions were also generated. COMPARISON:  Cervical spine MRI 06/28/2015 FINDINGS: Alignment: Exaggerated lordosis. Skull base and vertebrae: There is extensive prevertebral thickening extending from C2 to T2. Ventral bony erosions seen at C7, T1, and T2. At C3-4, gas extends into a chronic screw defect. Scattered bubbles of gas throughout the areas of prevertebral thickening. The supraglottic larynx is narrowed by  retropharyngeal  swelling, but no critical airway stenosis. Negative for acute fracture. Distorted appearance of the C3, C4, C5, and C6 vertebral bodies related to previous infection and hardware that has been removed. Soft tissues and spinal canal: Prevertebral findings described above. Disc levels: C2-3: Unremarkable. C3-4: Ankylosis. Discitis was seen here previously. There was previously hardware which was removed at the time of prior infection. Bony fusion is solid. Patent foramina. Spinal stenosis from residual ridging is advanced. C4-5: Solid bony fusion. Residual biforaminal stenosis and canal stenosis from residual spurring. C5-6: Corpectomy changes at C5. No solid bony fusion at C5-6. Bilateral uncovertebral spurs with C6 impingement. At least moderate spinal stenosis. C6-7: Mild degenerative changes at the disc. Facet arthropathy with asymmetric bulky right spurring. Advanced right foraminal stenosis. C7-T1:Facet arthropathy with asymmetric advanced right facet spurring. Prominent right foraminal stenosis. Upper chest: Inflammatory changes extend into the upper chest. Ovoid 6 mm nodule in the right upper lobe, not seen in 2016. Other: These results were called by telephone at the time of interpretation on 11/06/2016 at 4:15 pm to Dr. Venita Lick , who verbally acknowledged these results. Reportedly there was recent outside cervical MRI. IMPRESSION: 1. Prevertebral thickening and gas bubbles from C3 to T2 consistent with infectious collection. Patient had similar findings in 2016 when there was esophageal perforation, question recurrent esophageal perforation or chronic fistula. 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 Swelling causes moderate supraglottic laryngeal narrowing. 2. C3-4 ankylosis. C4-C6 anterior fusion. Pseudoarthrosis present at C5-6. 3. Spinal stenosis with cord impingement at C3-4 and C4-5, chronic. Foraminal narrowings described above. Electronically Signed   By: Marnee Spring M.D.   On: 11/06/2016 16:28   Dg Esophagus W/water Sol Cm  Result Date: 11/07/2016 CLINICAL DATA:  Complicated past medical history with anterior lower cervical fusion remotely and development of retropharyngeal abscess in 2016. Patient underwent removal of cervical fusion hardware and ENT evaluation at that time revealed a defect in the posterior wall of the esophagus at the C5-6 level. EXAM: ESOPHOGRAM/BARIUM SWALLOW TECHNIQUE: Single contrast examination was performed using water-soluble contrast. FLUOROSCOPY TIME:  Fluoroscopy Time:  1 minutes and 18 seconds Radiation Exposure Index (if provided by the fluoroscopic device): Number of Acquired Spot Images: 0 COMPARISON:  02/15/2016.  06/21/2015. FINDINGS: As seen on prior imaging studies, the patient has a moderate-sized posterior/Zenker's diverticulum. The posterior wall of the diverticulum appears to be fixed and immobile. There is debris within the diverticulum. The patient was given multiple swallows of water soluble contrast, and no extravasation of contrast from the posterior wall of the hypopharynx or proximal esophagus was observed. No evidence for extravasation from the diverticulum itself. Soft tissue gas identified in the retropharyngeal soft tissues. The patient had an episode of relatively vigorous coughing during the exam, but close inspection of the larynx and proximal trachea revealed no evidence of aspiration. IMPRESSION: 1. 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 Posterior wall of the diverticulum is fixed, likely due to scarring from prior cervical spine surgery. No contrast extravasation could be demonstrated from the posterior hypopharynx, posterior esophagus, or diverticulum on today's study. 2. Gas collection identified in the retropharyngeal soft tissues, as seen on the recent CT scan. Electronically Signed   By: Kennith Center M.D.   On: 11/07/2016 08:57      Lab Results  Component Value Date    HGBA1C 6.4 (H) 10/15/2015   HGBA1C 5.8 (H) 04/29/2015   HGBA1C 5.5 08/26/2014   Lab Results  Component Value Date   LDLCALC 90 05/30/2011  CREATININE 0.53 11/08/2016       Scheduled Meds: . gabapentin  600 mg Oral QID  . insulin aspart  0-9 Units Subcutaneous Q4H  . losartan  25 mg Oral Daily  . meropenem (MERREM) IV  1 g Intravenous Q8H  . mometasone-formoterol  2 puff Inhalation BID  . nicotine  21 mg Transdermal Daily  . pantoprazole (PROTONIX) IV  40 mg Intravenous Q24H  . vancomycin  1,000 mg Intravenous Q12H   Continuous Infusions: . 0.9 % NaCl with KCl 40 mEq / L       LOS: 2 days    Time spent: >30 MINS    [facility]  [facility] Pager [phone]. If 7PM-7AM, please contact night-coverage at [email or web address], password [facility] 11/08/2016, 8:37 AM  LOS: 2 days

## 2016-11-08 NOTE — Progress Notes (Signed)
Pt is complaining of severe mid and lower back pain she is getting 2 mg dilaudid q 3 hr which is not managing her pain for the full 3 hrs, she has developed diurea from the antibiotics.

## 2016-11-08 NOTE — Anesthesia Procedure Notes (Signed)
Procedure Name: Intubation Date/Time: 11/08/2016 1:28 PM Performed by: Clearnce Sorrel Pre-anesthesia Checklist: Patient identified, Emergency Drugs available, Suction available, Patient being monitored and Timeout performed Patient Re-evaluated:Patient Re-evaluated prior to inductionOxygen Delivery Method: Circle system utilized Preoxygenation: Pre-oxygenation with 100% oxygen Intubation Type: IV induction Ventilation: Mask ventilation without difficulty Laryngoscope Size: Glidescope and 3 Grade View: Grade I Tube type: Oral Tube size: 7.0 mm Number of attempts: 1 Airway Equipment and Method: Stylet Placement Confirmation: ETT inserted through vocal cords under direct vision,  positive ETCO2 and breath sounds checked- equal and bilateral Secured at: 23 cm Tube secured with: Tape Dental Injury: Teeth and Oropharynx as per pre-operative assessment

## 2016-11-08 NOTE — Progress Notes (Signed)
    Subjective:    Patient reports pain as 6 on 0-10 scale.   Denies CP or SOB.  Voiding without difficulty. Positive flatus. Objective: Vital signs in last 24 hours: Temp:  [97.9 F (36.6 C)-98.4 F (36.9 C)] 98.1 F (36.7 C) (02/24 0105) Pulse Rate:  [76-86] 77 (02/24 0105) Resp:  [15-18] 16 (02/24 0105) BP: (135-157)/(68-79) 139/78 (02/24 0105) SpO2:  [93 %-96 %] 95 % (02/24 0105)  Intake/Output from previous day: No intake/output data recorded. Intake/Output this shift: No intake/output data recorded.  Labs:  Recent Labs  11/06/16 2337 11/08/16 0241  HGB 13.1 13.4    Recent Labs  11/06/16 2337 11/08/16 0241  WBC 11.9* 8.7  RBC 4.50 4.60  HCT 37.9 38.3  PLT 274 299    Recent Labs  11/06/16 2337 11/08/16 0241  NA 133* 134*  K 3.8 3.2*  CL 96* 97*  CO2 25 25  BUN 9 7  CREATININE 0.67 0.53  GLUCOSE 133* 143*  CALCIUM 9.1 9.1    Recent Labs  11/06/16 2337  INR 1.07    Physical Exam: Neurologically intact ABD soft Intact pulses distally Compartment soft  Assessment/Plan:     Appreciate ENT evaluation.  On iv broad spectrum antibiotics - WBC count decreasing Complains of significant lower thoracic pain this morning. Plan: 1. CT scan neck today.  If abscess is present will defer surgical drainage to ENT.  If no abscess then will move forward with PEG tube per interventional radiology 2. No focal neurologic deficits at present - will alter pain medications and add IV robaxin for muscle spasms.  If thoracic pain continues will order MRI to further evaluate. 3. Awaiting ID consult for modification of abx regimen 4. Will check CRP and ESR today 5. Patient refusing brace as it creates anxiety and difficulty breathing.  I did explain the importance of the brace and the risks of not using it and she still refused.  Will continue to monitor neuro exam.  Rustin Erhart D for Dr. Melina Schools [facility] [phone] 11/08/2016, 7:37  AM

## 2016-11-08 NOTE — Anesthesia Preprocedure Evaluation (Signed)
Anesthesia Evaluation  Patient identified by MRN, date of birth, ID band Patient awake    Reviewed: Allergy & Precautions, NPO status , Patient's Chart, lab work & pertinent test results  History of Anesthesia Complications Negative for: history of anesthetic complications  Airway Mallampati: II  TM Distance: >3 FB Neck ROM: Limited    Dental no notable dental hx. (+) Dental Advisory Given, Lower Dentures, Upper Dentures   Pulmonary asthma , COPD,  COPD inhaler, Current Smoker,  History respiratory failure   Pulmonary exam normal breath sounds clear to auscultation       Cardiovascular Exercise Tolerance: Poor hypertension, Pt. on medications (-) angina+ CAD, + Past MI and + Cardiac Stents  Normal cardiovascular exam+ dysrhythmias Atrial Fibrillation  Rhythm:Regular Rate:Normal     Neuro/Psych PSYCHIATRIC DISORDERS History encephalopathy negative neurological ROS  negative psych ROS   GI/Hepatic negative GI ROS, (+)     substance abuse  alcohol use, Claims no alcohol in several months   Endo/Other  diabetes, Well Controlled, Type 2  Renal/GU negative Renal ROS  negative genitourinary   Musculoskeletal  (+) Arthritis , Osteoarthritis,    Abdominal   Peds negative pediatric ROS (+)  Hematology negative hematology ROS (+)   Anesthesia Other Findings   Reproductive/Obstetrics negative OB ROS                             Anesthesia Physical Anesthesia Plan  ASA: III  Anesthesia Plan: General   Post-op Pain Management:    Induction: Intravenous  Airway Management Planned: Oral ETT  Additional Equipment: None  Intra-op Plan:   Post-operative Plan: Extubation in OR  Informed Consent: I have reviewed the patients History and Physical, chart, labs and discussed the procedure including the risks, benefits and alternatives for the proposed anesthesia with the patient or authorized  representative who has indicated his/her understanding and acceptance.   Dental advisory given  Plan Discussed with: CRNA and Surgeon  Anesthesia Plan Comments:         Anesthesia Quick Evaluation

## 2016-11-08 NOTE — Anesthesia Postprocedure Evaluation (Addendum)
Anesthesia Post Note  Patient: [name]  Procedure(s) Performed: Procedure(s) (LRB): INCISION AND DRAINAGE OF NECK ABSCESS (N/A)  Patient location during evaluation: PACU Anesthesia Type: General Level of consciousness: awake and alert Pain management: pain level controlled Vital Signs Assessment: post-procedure vital signs reviewed and stable Respiratory status: spontaneous breathing, nonlabored ventilation, respiratory function stable and patient connected to nasal cannula oxygen Cardiovascular status: blood pressure returned to baseline and stable Postop Assessment: no signs of nausea or vomiting Anesthetic complications: no       Last Vitals:  Vitals:   11/08/16 1617 11/08/16 1630  BP: (!) 146/72 137/71  Pulse: 90   Resp: 13   Temp:      Last Pain:  Vitals:   11/08/16 1156  TempSrc:   PainSc: 10-Worst pain ever    LLE Motor Response: Purposeful movement (11/08/16 1724) LLE Sensation: Full sensation (11/08/16 1724) RLE Motor Response: Purposeful movement (11/08/16 1724) RLE Sensation: Full sensation (11/08/16 1724)      Kiele Heavrin

## 2016-11-08 NOTE — Transfer of Care (Signed)
Immediate Anesthesia Transfer of Care Note  Patient: [name]  Procedure(s) Performed: Procedure(s): INCISION AND DRAINAGE OF NECK ABSCESS (N/A)  Patient Location: PACU  Anesthesia Type:General  Level of Consciousness: awake, alert  and oriented  Airway & Oxygen Therapy: Patient Spontanous Breathing and Patient connected to nasal cannula oxygen  Post-op Assessment: Report given to RN and Post -op Vital signs reviewed and stable  Post vital signs: Reviewed and stable  Last Vitals:  Vitals:   11/08/16 0105 11/08/16 0927  BP: 139/78 (!) 123/93  Pulse: 77 87  Resp: 16 20  Temp: 36.7 C 36.9 C    Last Pain:  Vitals:   11/08/16 1156  TempSrc:   PainSc: 10-Worst pain ever      Patients Stated Pain Goal: 2 ([id number])  Complications: No apparent anesthesia complications

## 2016-11-08 NOTE — Op Note (Signed)
[name], [name]                ACCOUNT NO.:  [id number]  MEDICAL RECORD NO.:  [id number]  LOCATION:  5C18C                        FACILITY:  [facility]  PHYSICIAN:  Kinnie Scales. Annalee Genta, M.D.DATE OF BIRTH:  Jun 18, 1946  DATE OF PROCEDURE:  11/08/2016 DATE OF DISCHARGE:                              OPERATIVE REPORT   LOCATION:  [facility] Main OR.  PREOPERATIVE DIAGNOSES: 1. Cervical spinal abscess. 2. Chronic dysphagia with pharyngoesophageal diverticulum.  POSTOPERATIVE DIAGNOSES: 1. Cervical spinal abscess. 2. Chronic dysphagia with pharyngoesophageal diverticulum.  INDICATION FOR SURGERY: 1. Cervical spinal abscess. 2. Chronic dysphagia with pharyngoesophageal diverticulum.  SURGICAL PROCEDURE: 1. Direct laryngoscopy and placement of a nasogastric feeding tube. 2. Exploration of left neck with incision and drainage of prevertebral     abscess.  ANESTHESIA:  General endotracheal.  SURGEON:  Kinnie Scales. Annalee Genta, M.D.  ASSISTANTDebria Garret D. Shon Baton, M.D.  ESTIMATED BLOOD LOSS:  Minimal blood loss.  COMPLICATIONS:  No complications.  DISPOSITION:  The patient was transferred from the operating room to the recovery room in stable condition.  BRIEF HISTORY:  The patient is a 71 year old white female with a very complicated history.  She has undergone extensive anterior approach for cervical fusion performed by Dr. Shon Baton over 7 years ago.  The patient developed an acute paraspinal infection in August 2016, necessitating incision and drainage and removal of her cervical hardware plate.  Over time, with appropriate medical therapy, the patient healed.  Her pharyngoesophageal fistula resolved and she was able tolerate liquid and soft diet without difficulty.  A followup swallowing study showed a diverticulum like outpouching at the level of the previous infection and the patient was stable and doing well on followup study in October 2016. Two weeks prior to her  current admission, the patient began to develop increasing symptoms of neck pain.  She was evaluated by Dr. Shon Baton in the office and given her prior history, an MRI scan was obtained and concerns were raised regarding recurrent infection.  The patient complained of increasing pain and developed low-grade fever.  A spinal CT scan was performed and the patient was found to have air in the prevertebral soft tissues consistent with an ongoing infection.  She was admitted to the hospital for intravenous antibiotic therapy.  A Gastrografin swallowing study was performed, which showed no evidence of leakage or extravasation.  Findings were similar to previous study in October 2016.  Followup contrast-enhanced CT scan was obtained on the morning of November 08, 2016 and she was found have a progression in her findings of infection with increasing soft tissue swelling and more air in the soft tissues extending both superiorly as well as inferiorly into the mediastinum.  Given this progression in symptoms with continued fever and pain, we opted to undertake an exploration and drainage for possible abscess.  Given the patient's difficulty with feeding and concerns regarding possible pharyngoesophageal leak, we opted to perform a direct laryngoscopy and place a nasogastric feeding tube at the same time.  The risks and benefits of these procedures were discussed in detail with the patient and her family, and they understood and agreed with our plan for surgery, which is scheduled on  elective basis at [facility] on November 08, 2016.  DESCRIPTION OF PROCEDURE:  The patient was brought to the operating room at [facility] and placed in supine position on the operating table.  General endotracheal anesthesia was established without difficulty.  The patient was positioned and a surgical time-out was then performed with correct identification of the patient and the surgical procedure.   Using a Dedo laryngoscope, direct laryngoscopy was performed, visualizing the patient's larynx and esophageal introitus. There was no evidence of pharyngoesophageal tear or other significant abnormality.  The outpouching was noted under direct visualization.  The nasogastric feeding tube was passed into the esophagus and stomach. This was confirmed with insufflation of air and the tube was taped to the nostril.  There was no purulent drainage or abnormal inflammation on examination.  With the laryngoscopy performed, we then moved forward with the exploration of the neck.  After reviewing the CT scan, she appeared to have more swelling and possible soft tissue air on the left-hand side. The patient had previously had bilateral approaches to her cervical spine.  In a pre-existing scar, she was injected with 2 mL of 1% lidocaine 1:200,000 dilution epinephrine.  After allowing adequate time for vasoconstriction, the patient was prepped, draped, and prepared for surgery.  A 3-cm vertically oriented incision was created in the pre- existing scar.  This was carried through the skin and underlying subcutaneous tissue.  The anterior border of the sternocleidomastoid muscle was identified and lateralized along with the carotid artery and jugular vein.  Dissection was then carried from superficial to deep along the neurovascular bundle to the level of the prevertebral space. The patient's larynx was elevated, pharynx was lateralized, and direct access to the anterior aspect of the prevertebral space was accomplished.  Using both blunt and sharp dissection, we were able to dissect along the vertebral bodies.  There was no obvious pus or infection in this plane.  Multiple cultures were then taken for aerobic and anaerobic cultures.  The dissection was extended inferiorly into the upper mediastinum and again there was no purulent material identified. The patient was copiously irrigated with 3 L of  sterile saline.  Penrose drains were then placed inferiorly and superiorly and sutured to the skin with a 3-0 Ethilon suture.  The patient's wound was dressed with 4 x 4 gauze and a Kerlix wrap.  She was then awakened from anesthetic and was extubated and transferred from the operating room to the recovery room in stable condition.          ______________________________ Kinnie Scales Annalee Genta, M.D.     DLS/MEDQ  D:  16/06/9603  T:  11/08/2016  Job:  [id number]

## 2016-11-08 NOTE — Progress Notes (Signed)
ENT Progress Note: HD#2   Subjective: Cont neck pain  Objective: Vital signs in last 24 hours: Temp:  [97.9 F (36.6 C)-98.4 F (36.9 C)] 98.4 F (36.9 C) (02/24 0927) Pulse Rate:  [76-87] 87 (02/24 0927) Resp:  [15-20] 20 (02/24 0927) BP: (123-157)/(68-93) 123/93 (02/24 0927) SpO2:  [93 %-96 %] 96 % (02/24 0927) Weight change:  Last BM Date: 11/08/16  Intake/Output from previous day: No intake/output data recorded. Intake/Output this shift: No intake/output data recorded.  Labs:  Recent Labs  11/06/16 2337 11/08/16 0241  WBC 11.9* 8.7  HGB 13.1 13.4  HCT 37.9 38.3  PLT 274 299    Recent Labs  11/06/16 2337 11/08/16 0241  NA 133* 134*  K 3.8 3.2*  CL 96* 97*  CO2 25 25  GLUCOSE 133* 143*  BUN 9 7  CALCIUM 9.1 9.1    Studies/Results: Ct Cervical Spine Wo Contrast  Result Date: 11/06/2016 CLINICAL DATA:  Neck pain. Bilateral arm pain and tingling. Difficulty swallowing for 2 weeks EXAM: CT CERVICAL SPINE WITHOUT CONTRAST TECHNIQUE: Multidetector CT imaging of the cervical spine was performed without intravenous contrast. Multiplanar CT image reconstructions were also generated. COMPARISON:  Cervical spine MRI 06/28/2015 FINDINGS: Alignment: Exaggerated lordosis. Skull base and vertebrae: There is extensive prevertebral thickening extending from C2 to T2. Ventral bony erosions seen at C7, T1, and T2. At C3-4, gas extends into a chronic screw defect. Scattered bubbles of gas throughout the areas of prevertebral thickening. The supraglottic larynx is narrowed by retropharyngeal swelling, but no critical airway stenosis. Negative for acute fracture. Distorted appearance of the C3, C4, C5, and C6 vertebral bodies related to previous infection and hardware that has been removed. Soft tissues and spinal canal: Prevertebral findings described above. Disc levels: C2-3: Unremarkable. C3-4: Ankylosis. Discitis was seen here previously. There was previously hardware which  was removed at the time of prior infection. Bony fusion is solid. Patent foramina. Spinal stenosis from residual ridging is advanced. C4-5: Solid bony fusion. Residual biforaminal stenosis and canal stenosis from residual spurring. C5-6: Corpectomy changes at C5. No solid bony fusion at C5-6. Bilateral uncovertebral spurs with C6 impingement. At least moderate spinal stenosis. C6-7: Mild degenerative changes at the disc. Facet arthropathy with asymmetric bulky right spurring. Advanced right foraminal stenosis. C7-T1:Facet arthropathy with asymmetric advanced right facet spurring. Prominent right foraminal stenosis. Upper chest: Inflammatory changes extend into the upper chest. Ovoid 6 mm nodule in the right upper lobe, not seen in 2016. Other: These results were called by telephone at the time of interpretation on 11/06/2016 at 4:15 pm to Dr. Melina [name] , who verbally acknowledged these results. Reportedly there was recent outside cervical MRI. IMPRESSION: 1. Prevertebral thickening and gas bubbles from C3 to T2 consistent with infectious collection. Patient had similar findings in 2016 when there was esophageal perforation, question recurrent esophageal perforation or chronic fistula. 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 Swelling causes moderate supraglottic laryngeal narrowing. 2. C3-4 ankylosis. C4-C6 anterior fusion. Pseudoarthrosis present at C5-6. 3. Spinal stenosis with cord impingement at C3-4 and C4-5, chronic. Foraminal narrowings described above. Electronically Signed   By: [name] M.D.   On: 11/06/2016 16:28   Dg Esophagus W/water Sol Cm  Result Date: 11/07/2016 CLINICAL DATA:  Complicated past medical history with anterior lower cervical fusion remotely and development of retropharyngeal abscess in 2016. Patient underwent removal of cervical fusion hardware and ENT evaluation at that time revealed a defect in the posterior wall of the esophagus  at the C5-6 level. EXAM:  ESOPHOGRAM/BARIUM SWALLOW TECHNIQUE: Single contrast examination was performed using water-soluble contrast. FLUOROSCOPY TIME:  Fluoroscopy Time:  1 minutes and 18 seconds Radiation Exposure Index (if provided by the fluoroscopic device): Number of Acquired Spot Images: 0 COMPARISON:  02/15/2016.  06/21/2015. FINDINGS: As seen on prior imaging studies, the patient has a moderate-sized posterior/Zenker's diverticulum. The posterior wall of the diverticulum appears to be fixed and immobile. There is debris within the diverticulum. The patient was given multiple swallows of water soluble contrast, and no extravasation of contrast from the posterior wall of the hypopharynx or proximal esophagus was observed. No evidence for extravasation from the diverticulum itself. Soft tissue gas identified in the retropharyngeal soft tissues. The patient had an episode of relatively vigorous coughing during the exam, but close inspection of the larynx and proximal trachea revealed no evidence of aspiration. IMPRESSION: 1. 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 Posterior wall of the diverticulum is fixed, likely due to scarring from prior cervical spine surgery. No contrast extravasation could be demonstrated from the posterior hypopharynx, posterior esophagus, or diverticulum on today's study. 2. Gas collection identified in the retropharyngeal soft tissues, as seen on the recent CT scan. Electronically Signed   By: [name] M.D.   On: 11/07/2016 08:57     PHYSICAL EXAM: No swelling or erythema   Assessment/Plan: Prog ST swelling in prevertebral region with increased gas, extends from cervical region to upper mediastinum. Discussed with Dr. Rolena [name], given progressive findings rec I&D of neck abscess and placement of ng feeding tube. Risks and indications discussd with pt and family.    [place], [name] 11/08/2016, 10:29 AM

## 2016-11-09 ENCOUNTER — Encounter (HOSPITAL_COMMUNITY): Payer: Self-pay | Admitting: Otolaryngology

## 2016-11-09 DIAGNOSIS — M4623 Osteomyelitis of vertebra, cervicothoracic region: Secondary | ICD-10-CM

## 2016-11-09 DIAGNOSIS — L0211 Cutaneous abscess of neck: Principal | ICD-10-CM

## 2016-11-09 DIAGNOSIS — Z794 Long term (current) use of insulin: Secondary | ICD-10-CM

## 2016-11-09 DIAGNOSIS — Z881 Allergy status to other antibiotic agents status: Secondary | ICD-10-CM

## 2016-11-09 DIAGNOSIS — Z8661 Personal history of infections of the central nervous system: Secondary | ICD-10-CM

## 2016-11-09 DIAGNOSIS — Z8719 Personal history of other diseases of the digestive system: Secondary | ICD-10-CM

## 2016-11-09 DIAGNOSIS — Z9689 Presence of other specified functional implants: Secondary | ICD-10-CM

## 2016-11-09 DIAGNOSIS — B9689 Other specified bacterial agents as the cause of diseases classified elsewhere: Secondary | ICD-10-CM

## 2016-11-09 DIAGNOSIS — Z981 Arthrodesis status: Secondary | ICD-10-CM

## 2016-11-09 DIAGNOSIS — Z9889 Other specified postprocedural states: Secondary | ICD-10-CM

## 2016-11-09 DIAGNOSIS — I10 Essential (primary) hypertension: Secondary | ICD-10-CM

## 2016-11-09 DIAGNOSIS — Z8249 Family history of ischemic heart disease and other diseases of the circulatory system: Secondary | ICD-10-CM

## 2016-11-09 DIAGNOSIS — Z833 Family history of diabetes mellitus: Secondary | ICD-10-CM

## 2016-11-09 DIAGNOSIS — F17210 Nicotine dependence, cigarettes, uncomplicated: Secondary | ICD-10-CM

## 2016-11-09 DIAGNOSIS — E119 Type 2 diabetes mellitus without complications: Secondary | ICD-10-CM

## 2016-11-09 DIAGNOSIS — Z880 Allergy status to penicillin: Secondary | ICD-10-CM

## 2016-11-09 LAB — BASIC METABOLIC PANEL
Anion gap: 9 (ref 5–15)
BUN: 5 mg/dL — AB (ref 6–20)
CALCIUM: 8.5 mg/dL — AB (ref 8.9–10.3)
CHLORIDE: 98 mmol/L — AB (ref 101–111)
CO2: 27 mmol/L (ref 22–32)
CREATININE: 0.46 mg/dL (ref 0.44–1.00)
GFR calc non Af Amer: >60 mL/min (ref >60)
GLUCOSE: 148 mg/dL — AB (ref 65–99)
Potassium: 3.4 mmol/L — ABNORMAL LOW (ref 3.5–5.1)
Sodium: 134 mmol/L — ABNORMAL LOW (ref 135–145)

## 2016-11-09 LAB — GLUCOSE, CAPILLARY
GLUCOSE-CAPILLARY: 103 mg/dL — AB (ref 65–99)
GLUCOSE-CAPILLARY: 124 mg/dL — AB (ref 65–99)
Glucose-Capillary: 108 mg/dL — ABNORMAL HIGH (ref 65–99)
Glucose-Capillary: 126 mg/dL — ABNORMAL HIGH (ref 65–99)
Glucose-Capillary: 135 mg/dL — ABNORMAL HIGH (ref 65–99)
Glucose-Capillary: 141 mg/dL — ABNORMAL HIGH (ref 65–99)

## 2016-11-09 MED ORDER — GABAPENTIN 600 MG PO TABS
600.0000 mg | ORAL_TABLET | Freq: Four times a day (QID) | ORAL | Status: DC
  Administered 2016-11-09 – 2016-11-16 (×22): 600 mg
  Filled 2016-11-09 (×24): qty 1

## 2016-11-09 MED ORDER — POTASSIUM CHLORIDE 20 MEQ/15ML (10%) PO SOLN
20.0000 meq | Freq: Every day | ORAL | Status: DC
  Administered 2016-11-09 – 2016-11-16 (×7): 20 meq
  Filled 2016-11-09 (×7): qty 15

## 2016-11-09 MED ORDER — ALPRAZOLAM 0.25 MG PO TABS
0.2500 mg | ORAL_TABLET | Freq: Every day | ORAL | Status: DC | PRN
  Administered 2016-11-09 – 2016-11-12 (×3): 0.25 mg via NASOGASTRIC
  Filled 2016-11-09 (×5): qty 1

## 2016-11-09 NOTE — Progress Notes (Signed)
ENT Progress Note: POD #1 s/p Procedure(s): INCISION AND DRAINAGE OF NECK ABSCESS   Subjective: Decreased pain  Objective: Vital signs in last 24 hours: Temp:  [97.5 F (36.4 C)-99.4 F (37.4 C)] 99.4 F (37.4 C) (02/25 0500) Pulse Rate:  [80-92] 87 (02/25 0500) Resp:  [13-25] 18 (02/25 0500) BP: (123-155)/(62-93) 149/64 (02/25 0500) SpO2:  [92 %-99 %] 94 % (02/25 0500) Weight change:  Last BM Date: 11/08/16  Intake/Output from previous day: 02/24 0701 - 02/25 0700 In: 800 [I.V.:800] Out: 50 [Blood:50] Intake/Output this shift: No intake/output data recorded.  Labs:  Recent Labs  11/06/16 2337 11/08/16 0241  WBC 11.9* 8.7  HGB 13.1 13.4  HCT 37.9 38.3  PLT 274 299    Recent Labs  11/08/16 0241 11/09/16 0219  NA 134* 134*  K 3.2* 3.4*  CL 97* 98*  CO2 25 27  GLUCOSE 143* 148*  BUN 7 5*  CALCIUM 9.1 8.5*    Studies/Results: Ct Soft Tissue Neck W Contrast  Result Date: 11/08/2016 CLINICAL DATA:  Dysphagia.  Prevertebral infection.  Osteomyelitis. EXAM: CT NECK WITH CONTRAST TECHNIQUE: Multidetector CT imaging of the neck was performed using the standard protocol following the bolus administration of intravenous contrast. CONTRAST:  87mL ISOVUE-300 IOPAMIDOL (ISOVUE-300) INJECTION 61% COMPARISON:  CT of the cervical spine 11/06/2016 at [place]. MRI of the cervical spine without and with contrast 11/04/2016 at [facility]. FINDINGS: Pharynx and larynx: A diffuse retropharyngeal and prevertebral gas and soft tissue collection extends from C2 to at least sixth T2-3. The collection extends into the superior mediastinum. The the collection is slightly larger in AP dimension throughout its course. Vocal cords are midline. No other focal pharyngeal lesions are present. Salivary glands: The parotid and submandibular glands are within normal limits bilaterally. Thyroid: Thyroid is unremarkable. Lymph nodes: Multiple subcentimeter level 2, level 3,  and level 4 lymph nodes are present bilaterally. These are more prominent left than right. Vascular: Atherosclerotic calcifications are present at the carotid bifurcations bilaterally without significant stenosis. Additional calcifications are present at the aortic arch and origins the great vessels. Limited intracranial: Within normal limits. Visualized orbits: Not imaged. Mastoids and visualized paranasal sinuses: The paranasal sinuses and mastoid air cells are clear. Skeleton: C3-4 and C4-5 fusion is again noted. Incomplete fusion is evident at C5-6 with prominent osteophytes contributing to foraminal stenosis. There is loss of bone mineralization compatible with osteomyelitis at C7, T1, and T2. Physis is evident within these vertebral bodies. Upper chest: A 7 mm cavitary nodule is again seen within the right upper lobe. Multiple peripheral bulla are present. The prevertebral soft tissue collection extends into the superior mediastinum. IMPRESSION: 1. Interval increase in size of extensive prevertebral and retropharyngeal gas and fluid collection extending from C2 through T2. This is highly concerning for infection with extension into the superior mediastinum putting the patient at risk for mediastinitis. 2. Decreased mineralization at C7, T1, and T2 compatible with known osteomyelitis. Lytic areas are evident. 3. Sclerosis and fusion at C3-4 and C4-5. 4. Nonunion at C5-6. 5. 7 mm cavitary nodule in the right upper lobe. Non-contrast chest CT at 6-12 months is recommended. If the nodule is stable at time of repeat CT, then future CT at 18-24 months (from today's scan) is considered optional for low-risk patients, but is recommended for high-risk patients. This recommendation follows the consensus statement: Guidelines for Management of Incidental Pulmonary Nodules Detected on CT Images: From the Fleischner Society 2017; Radiology 2017; 284:228-243. These results were  called by telephone at the time of  interpretation on 11/08/2016 at 10:41 am to Dr. Victorino December , who verbally acknowledged these results. Electronically Signed   By: San Morelle M.D.   On: 11/08/2016 10:41   Dg Chest Port 1 View  Result Date: 11/08/2016 CLINICAL DATA:  71 y/o  F; incision and drainage of neck abscess. EXAM: PORTABLE CHEST 1 VIEW COMPARISON:  01/02/2016 chest radiograph. FINDINGS: Stable cardiomegaly. Aortic atherosclerosis with calcification. Enteric tube tip below field of view and abdomen. Right axillary surgical clips. Left lung bases partially outside field of view. Visualized lung fields are clear. Curvilinear density in left base of neck, probably bandaging. Bones are unremarkable. IMPRESSION: Stable cardiomegaly. Aortic atherosclerosis. No acute pulmonary process identified. Electronically Signed   By: Kristine Garbe M.D.   On: 11/08/2016 17:56   Dg Abd Portable 1v  Result Date: 11/08/2016 CLINICAL DATA:  71 y/o  F; enteric tube placement. EXAM: PORTABLE ABDOMEN - 1 VIEW COMPARISON:  None. FINDINGS: Insert 2 tip projects over gastric body. Retained contrast within the colon. Severe dextrocurvature and degenerative changes of the lumbar spine. Partially visualized total hip arthroplasty. Nonobstructive bowel gas pattern. IMPRESSION: Enteric tube tip projects over the gastric body. Electronically Signed   By: Kristine Garbe M.D.   On: 11/08/2016 17:59     PHYSICAL EXAM: No swelling  Min drainage o/n   Assessment/Plan: Pt stable,    cont fever but decreased pain after I&D BID dressing changes Consider beginning tube feeds for nutrition and pain meds Consider repeat swallow to assess for leak - if negative, pt could resume liquid and soft po      [name] 11/09/2016, 8:44 AM

## 2016-11-09 NOTE — Progress Notes (Signed)
[facility] Infusion Coordinator will follow patient during this hospitalization to support home infusion services for home IV ABX as ordered at DC.  [facility] pharmacy team will partner with patient's home health agency of choice for home care services.   If patient discharges after hours, please call [phone].   [name] 11/09/2016, 9:22 PM

## 2016-11-09 NOTE — Progress Notes (Signed)
   Subjective:  Patient reports pain as moderate.  No c/o.  Objective:   VITALS:   Vitals:   11/08/16 1926 11/08/16 2059 11/09/16 0100 11/09/16 0500  BP:  (!) 149/70 (!) 148/62 (!) 149/64  Pulse:  80 92 87  Resp:  18 20 18   Temp:  99.1 F (37.3 C) 99.1 F (37.3 C) 99.4 F (37.4 C)  TempSrc:  Oral Oral Oral  SpO2: 97% 92% 94% 94%  Weight:      Height:        NAD Sitting up in chair NGT present Dressing c/d/i No focal motor or sensory deficit   Lab Results  Component Value Date   WBC 8.7 11/08/2016   HGB 13.4 11/08/2016   HCT 38.3 11/08/2016   MCV 83.3 11/08/2016   PLT 299 11/08/2016   BMET    Component Value Date/Time   NA 134 (L) 11/09/2016 0219   K 3.4 (L) 11/09/2016 0219   CL 98 (L) 11/09/2016 0219   CO2 27 11/09/2016 0219   GLUCOSE 148 (H) 11/09/2016 0219   BUN 5 (L) 11/09/2016 0219   CREATININE 0.46 11/09/2016 0219   CREATININE 0.47 (L) 08/22/2015 1034   CALCIUM 8.5 (L) 11/09/2016 0219   GFRNONAA >60 11/09/2016 0219   GFRNONAA >89 08/22/2015 1034   GFRAA >60 11/09/2016 0219   GFRAA >89 08/22/2015 1034     Assessment/Plan: 1 Day Post-Op   Principal Problem:   Abscess of neck Active Problems:   Diabetes mellitus type 2, insulin dependent ([place])   Postop care per ENT Appreciate hospitalist medical management ID consult for abx selection and duration    [name], [name] 11/09/2016, 9:41 AM

## 2016-11-09 NOTE — Progress Notes (Signed)
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  She had a protracted course of liquid levaquin and then was off by January 2017.  She comes in now with shoulder and arm pain and CT scan of neck suggests prevertebral thickening and gas bubbles concerning for abscess and disc changes concerning for discitis/osteomyelitis as above. Patient is status post placement of a national gastric feeding tube and exploration of left neck with incision and drainage of prevertebral abscess by ENT on 2/24.   TRH consulted for the management of diabetes.  Assessment & Plan:   #Type 2 diabetes: -Patient has A1c level of 6.4 on 10/15/2015. I will add A1c level today. -Patient is currently on insulin sliding scale every 4 hours. Blood sugar level acceptable. Currently on naso-gastric tube feed. Continue to monitor closely.  #Hypertension: Cozaar currently on hold. Blood pressure acceptable. Continue to monitor.  #History of COPD: Stable now. Continue bronchodilators.  # Neck abscess and chronic pharyngoesophageal dysphagia: On meropenem and vancomycin as per Id. -Further management as per primary team/surgeons.  #Anxiety disorder: On Xanax as needed.  Principal Problem:   Abscess of neck Active Problems:   Diabetes mellitus type 2, insulin dependent (HCC)  DVT prophylaxis: SCDs. Anticoagulation as per primary team/surgeons Code Status: Full code Family Communication: No family present at bedside Disposition Plan: As per primary  team.  Procedures: Surgery Antimicrobials: Vancomycin and meropenem  Subjective: Patient was seen and examined at bedside. Denied chest pain, shortness of breath, nausea or vomiting.  Objective: Vitals:   11/08/16 2059 11/09/16 0100 11/09/16 0500 11/09/16 1005  BP: (!) 149/70 (!) 148/62 (!) 149/64 140/68  Pulse: 80 92 87 82  Resp: 18 20 18 20   Temp: 99.1 F (37.3 C) 99.1 F (37.3 C) 99.4 F (37.4 C) 99 F (37.2 C)  TempSrc: Oral Oral Oral Oral  SpO2: 92% 94% 94% 96%  Weight:      Height:       No intake or output data in the 24 hours ending 11/09/16 1526 Filed Weights   11/06/16 2052  Weight: 70.5 kg (155 lb 6.4 oz)    Examination:  General exam: Appears calm and comfortable  Respiratory system: Clear to auscultation. Respiratory effort normal. No wheezing or crackle Cardiovascular system: S1 & S2 heard, RRR.  No pedal edema. Gastrointestinal system: Abdomen is nondistended, soft and nontender. Normal bowel sounds heard. Central nervous system: Alert and oriented. No focal neurological deficits. Extremities: Symmetric 5 x 5 power. Skin: No rashes, lesions or ulcers Psychiatry: Judgement and insight appear normal. Mood & affect appropriate.     Data Reviewed: I have personally reviewed following labs and imaging studies  CBC:  Recent Labs Lab 11/06/16 2337 11/08/16 0241  WBC 11.9* 8.7  NEUTROABS 9.5*  --   HGB 13.1 13.4  HCT 37.9 38.3  MCV 84.2 83.3  PLT 274 299   Basic Metabolic Panel:  Recent Labs Lab 11/06/16 2337 11/08/16 0241 11/09/16 0219  NA 133* 134* 134*  K 3.8 3.2* 3.4*  CL 96* 97* 98*  CO2 25 25 27   GLUCOSE 133* 143*  148*  BUN 9 7 5*  CREATININE 0.67 0.53 0.46  CALCIUM 9.1 9.1 8.5*   GFR: Estimated Creatinine Clearance: 61.9 mL/min (by C-G formula based on SCr of 0.46 mg/dL). Liver Function Tests:  Recent Labs Lab 11/06/16 2337 11/08/16 0241  AST 15 17  ALT 10* 10*  ALKPHOS 128* 128*  BILITOT 0.5 0.8  PROT 7.1 7.3   ALBUMIN 3.3* 3.0*   No results for input(s): LIPASE, AMYLASE in the last 168 hours. No results for input(s): AMMONIA in the last 168 hours. Coagulation Profile:  Recent Labs Lab 11/06/16 2337  INR 1.07   Cardiac Enzymes: No results for input(s): CKTOTAL, CKMB, CKMBINDEX, TROPONINI in the last 168 hours. BNP (last 3 results) No results for input(s): PROBNP in the last 8760 hours. HbA1C: No results for input(s): HGBA1C in the last 72 hours. CBG:  Recent Labs Lab 11/08/16 1209 11/08/16 2024 11/09/16 0034 11/09/16 0441 11/09/16 1136  GLUCAP 144* 137* 135* 141* 103*   Lipid Profile: No results for input(s): CHOL, HDL, LDLCALC, TRIG, CHOLHDL, LDLDIRECT in the last 72 hours. Thyroid Function Tests: No results for input(s): TSH, T4TOTAL, FREET4, T3FREE, THYROIDAB in the last 72 hours. Anemia Panel: No results for input(s): VITAMINB12, FOLATE, FERRITIN, TIBC, IRON, RETICCTPCT in the last 72 hours. Sepsis Labs: No results for input(s): PROCALCITON, LATICACIDVEN in the last 168 hours.  Recent Results (from the past 240 hour(s))  Aerobic Culture (superficial specimen)     Status: None (Preliminary result)   Collection Time: 11/08/16  2:10 PM  Result Value Ref Range Status   Specimen Description WOUND NECK  Final   Special Requests NONE  Final   Gram Stain   Final    RARE WBC PRESENT, PREDOMINANTLY MONONUCLEAR NO ORGANISMS SEEN    Culture NO GROWTH 1 DAY  Final   Report Status PENDING  Incomplete         Radiology Studies: Ct Soft Tissue Neck W Contrast  Result Date: 11/08/2016 CLINICAL DATA:  Dysphagia.  Prevertebral infection.  Osteomyelitis. EXAM: CT NECK WITH CONTRAST TECHNIQUE: Multidetector CT imaging of the neck was performed using the standard protocol following the bolus administration of intravenous contrast. CONTRAST:  75mL ISOVUE-300 IOPAMIDOL (ISOVUE-300) INJECTION 61% COMPARISON:  CT of the cervical spine 11/06/2016 at [facility]. MRI of the  cervical spine without and with contrast 11/04/2016 at [facility]. FINDINGS: Pharynx and larynx: A diffuse retropharyngeal and prevertebral gas and soft tissue collection extends from C2 to at least sixth T2-3. The collection extends into the superior mediastinum. The the collection is slightly larger in AP dimension throughout its course. Vocal cords are midline. No other focal pharyngeal lesions are present. Salivary glands: The parotid and submandibular glands are within normal limits bilaterally. Thyroid: Thyroid is unremarkable. Lymph nodes: Multiple subcentimeter level 2, level 3, and level 4 lymph nodes are present bilaterally. These are more prominent left than right. Vascular: Atherosclerotic calcifications are present at the carotid bifurcations bilaterally without significant stenosis. Additional calcifications are present at the aortic arch and origins the great vessels. Limited intracranial: Within normal limits. Visualized orbits: Not imaged. Mastoids and visualized paranasal sinuses: The paranasal sinuses and mastoid air cells are clear. Skeleton: C3-4 and C4-5 fusion is again noted. Incomplete fusion is evident at C5-6 with prominent osteophytes contributing to foraminal stenosis. There is loss of bone mineralization compatible with osteomyelitis at C7, T1, and T2. Physis is evident within these vertebral bodies. Upper chest: A 7 mm cavitary nodule is again seen within the right  upper lobe. Multiple peripheral bulla are present. The prevertebral soft tissue collection extends into the superior mediastinum. IMPRESSION: 1. Interval increase in size of extensive prevertebral and retropharyngeal gas and fluid collection extending from C2 through T2. This is highly concerning for infection with extension into the superior mediastinum putting the patient at risk for mediastinitis. 2. Decreased mineralization at C7, T1, and T2 compatible with known osteomyelitis. Lytic areas are evident. 3.  Sclerosis and fusion at C3-4 and C4-5. 4. Nonunion at C5-6. 5. 7 mm cavitary nodule in the right upper lobe. Non-contrast chest CT at 6-12 months is recommended. If the nodule is stable at time of repeat CT, then future CT at 18-24 months (from today's scan) is considered optional for low-risk patients, but is recommended for high-risk patients. This recommendation follows the consensus statement: Guidelines for Management of Incidental Pulmonary Nodules Detected on CT Images: From the Fleischner Society 2017; Radiology 2017; 284:228-243. These results were called by telephone at the time of interpretation on 11/08/2016 at 10:41 am to Dr. Duwayne Heck , who verbally acknowledged these results. Electronically Signed   By: Marin Roberts M.D.   On: 11/08/2016 10:41   Dg Chest Port 1 View  Result Date: 11/08/2016 CLINICAL DATA:  71 y/o  F; incision and drainage of neck abscess. EXAM: PORTABLE CHEST 1 VIEW COMPARISON:  01/02/2016 chest radiograph. FINDINGS: Stable cardiomegaly. Aortic atherosclerosis with calcification. Enteric tube tip below field of view and abdomen. Right axillary surgical clips. Left lung bases partially outside field of view. Visualized lung fields are clear. Curvilinear density in left base of neck, probably bandaging. Bones are unremarkable. IMPRESSION: Stable cardiomegaly. Aortic atherosclerosis. No acute pulmonary process identified. Electronically Signed   By: Mitzi Hansen M.D.   On: 11/08/2016 17:56   Dg Abd Portable 1v  Result Date: 11/08/2016 CLINICAL DATA:  71 y/o  F; enteric tube placement. EXAM: PORTABLE ABDOMEN - 1 VIEW COMPARISON:  None. FINDINGS: Insert 2 tip projects over gastric body. Retained contrast within the colon. Severe dextrocurvature and degenerative changes of the lumbar spine. Partially visualized total hip arthroplasty. Nonobstructive bowel gas pattern. IMPRESSION: Enteric tube tip projects over the gastric body. Electronically Signed   By: Mitzi Hansen M.D.   On: 11/08/2016 17:59        Scheduled Meds: . gabapentin  600 mg Per Tube QID  . insulin aspart  0-9 Units Subcutaneous Q4H  . meropenem (MERREM) IV  1 g Intravenous Q8H  . mometasone-formoterol  2 puff Inhalation BID  . nicotine  21 mg Transdermal Daily  . pantoprazole (PROTONIX) IV  40 mg Intravenous Q24H  . vancomycin  1,000 mg Intravenous Q12H   Continuous Infusions: . 0.9 % NaCl with KCl 40 mEq / L 75 mL/hr (11/09/16 0543)  . lactated ringers       LOS: 3 days    Dron Jaynie Collins, MD [facility] Pager [phone]  If 7PM-7AM, please contact night-coverage [email or web address] Password [facility] 11/09/2016, 3:26 PM

## 2016-11-09 NOTE — Progress Notes (Signed)
Pharmacy Antibiotic Note [name] is a 71 y.o. female admitted on 11/06/2016 with abscess of neck. POD 1 from I&D of prevertebral abscess. Currently on day 3 of meropenem and vancomycin for empiric treatment.    Plan: 1. Continue meropenem 1 gram IV every 8 hours  2. Continue vancomycin 1 gm IV every 12 hours 3. Obtain trough prior to am dose on 2/26 to evaluate current dosing regimen; goal trough 15-20 4. Follow up culture data and ID recs    Height: 5\' 2"  (157.5 cm) Weight: 155 lb 6.4 oz (70.5 kg) IBW/kg (Calculated) : 50.1  Temp (24hrs), Avg:98.5 F (36.9 C), Min:97.5 F (36.4 C), Max:99.4 F (37.4 C)   Recent Labs Lab 11/06/16 2337 11/08/16 0241 11/09/16 0219  WBC 11.9* 8.7  --   CREATININE 0.67 0.53 0.46    Estimated Creatinine Clearance: 61.9 mL/min (by C-G formula based on SCr of 0.46 mg/dL).    Allergies  Allergen Reactions  . Penicillins Rash    Has patient had a PCN reaction causing immediate rash, facial/tongue/throat swelling, SOB or lightheadedness with hypotension: unsure Has patient had a PCN reaction causing severe rash involving mucus membranes or skin necrosis: unsure Has patient had a PCN reaction that required hospitalization No Has patient had a PCN reaction occurring within the last 10 years: Yes If all of the above answers are "NO", then may proceed with Cephalosporin use.  . Zithromax [Azithromycin Dihydrate] Other (See Comments)    ORAL ULCERS     Antimicrobials this admission: 2/23 Merrem >> 2/23 Vancomycin >>   Microbiology results: 2/24: neck abscess: px   Thank you for allowing pharmacy to be a part of this patient's care.  Vincenza Hews, PharmD, BCPS 11/09/2016, 12:16 PM

## 2016-11-09 NOTE — Consult Note (Signed)
[facility] for [facility]ious Disease       Reason for Consult: osteomyelitis, abscess    Referring Physician: Dr. Lyla Glassing  Principal Problem:   Abscess of neck Active Problems:   Diabetes mellitus type 2, insulin dependent ([place])   . gabapentin  600 mg Per Tube QID  . insulin aspart  0-9 Units Subcutaneous Q4H  . meropenem (MERREM) IV  1 g Intravenous Q8H  . mometasone-formoterol  2 puff Inhalation BID  . nicotine  21 mg Transdermal Daily  . pantoprazole (PROTONIX) IV  40 mg Intravenous Q24H  . vancomycin  1,000 mg Intravenous Q12H    Recommendations: Continue with vancomycin and meropenem pending cultures  Routine HCV screening  Assessment: She has CT findings concerning for osteomyelitis of discs at C7, T1 and T2.  Also concern for abscess on CT, none reported intraoperatively.  No sign of esophageal leak.  Previous HIV test negative Penicillin allergy, rash ESR 52, CRP 1.9  Dr. Baxter Flattery to follow up tomorrow  Antibiotics: Vancomycin and meropenem  HPI: [name] is a 71 y.o. 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  She had a protracted course of liquid levaquin and then was off by January 2017.  She comes in now with shoulder and arm pain and CT scan of neck suggests prevertebral thickening and gas bubbles concerning for abscess and disc changes concerning for discitis/osteomyelitis as above.  No recent fever or chills.  Surgery done and debrided and culture sent with no growth to date.   CT independently reviewed and concerning for infection of the bone.    Review of Systems:  Constitutional: negative for fevers, chills and malaise Gastrointestinal: negative for  diarrhea Integument/breast: negative for rash All other systems reviewed and are negative    Past Medical History:  Diagnosis Date  . Asthma   . Breast cancer ([place])    right breast  . COPD (chronic obstructive pulmonary disease) ([place])   . Coronary artery disease   . Elevated LFTs   . Emphysema lung ([place])   . ETOH abuse   . H/O atrial fibrillation without current medication    only one time when she had sepsis  . Hyperlipidemia   . Hypertension    hx of but not on any medications  . Myocardial infarction 2000  . OA (osteoarthritis) of knee   . Osteoarthritis   . Tobacco abuse     Social History  Substance Use Topics  . Smoking status: Current Every Day Smoker    Packs/day: 1.00    Years: 50.00    Types: Cigarettes  . Smokeless tobacco: Never Used  . Alcohol use No    Family History  Problem Relation Age of Onset  . Diabetes Mother   . Hypertension Father   . Heart disease Father   . Heart attack Father   . Stroke Father     Allergies  Allergen Reactions  . Penicillins Rash    Has patient had a PCN reaction causing immediate rash, facial/tongue/throat swelling, SOB or lightheadedness with hypotension: unsure Has patient had a PCN reaction causing severe rash involving mucus membranes or skin necrosis: unsure Has patient had a PCN reaction that required hospitalization No Has patient had a PCN reaction occurring  within the last 10 years: Yes If all of the above answers are "NO", then may proceed with Cephalosporin use.  . Zithromax [Azithromycin Dihydrate] Other (See Comments)    ORAL ULCERS     Physical Exam: Constitutional: in no apparent distress and alert  Vitals:   11/09/16 0500 11/09/16 1005  BP: (!) 149/64 140/68  Pulse: 87 82  Resp: 18 20  Temp: 99.4 F (37.4 C) 99 F (37.2 C)   EYES: anicteric ENMT: + drain Cardiovascular: Cor RRR Respiratory: CTA B; normal respiratory effort GI: Bowel sounds are normal, liver is not enlarged, spleen is  not enlarged Musculoskeletal: no pedal edema noted Skin: negatives: no rash   Lab Results  Component Value Date   WBC 8.7 11/08/2016   HGB 13.4 11/08/2016   HCT 38.3 11/08/2016   MCV 83.3 11/08/2016   PLT 299 11/08/2016    Lab Results  Component Value Date   CREATININE 0.46 11/09/2016   BUN 5 (L) 11/09/2016   NA 134 (L) 11/09/2016   K 3.4 (L) 11/09/2016   CL 98 (L) 11/09/2016   CO2 27 11/09/2016    Lab Results  Component Value Date   ALT 10 (L) 11/08/2016   AST 17 11/08/2016   ALKPHOS 128 (H) 11/08/2016     Microbiology: Recent Results (from the past 240 hour(s))  Aerobic Culture (superficial specimen)     Status: None (Preliminary result)   Collection Time: 11/08/16  2:10 PM  Result Value Ref Range Status   Specimen Description WOUND NECK  Final   Special Requests NONE  Final   Gram Stain   Final    RARE WBC PRESENT, PREDOMINANTLY MONONUCLEAR NO ORGANISMS SEEN    Culture NO GROWTH 1 DAY  Final   Report Status PENDING  Incomplete    Amayia Ciano, Herbie [place], [place] for Infectious Disease [facility] [email or web address] [phone] pager  [phone] cell 11/09/2016, 1:31 PM

## 2016-11-10 LAB — BASIC METABOLIC PANEL
Anion gap: 10 (ref 5–15)
BUN: 5 mg/dL — ABNORMAL LOW (ref 6–20)
CHLORIDE: 101 mmol/L (ref 101–111)
CO2: 24 mmol/L (ref 22–32)
CREATININE: 0.42 mg/dL — AB (ref 0.44–1.00)
Calcium: 8.7 mg/dL — ABNORMAL LOW (ref 8.9–10.3)
GFR calc Af Amer: >60 mL/min (ref >60)
GFR calc non Af Amer: >60 mL/min (ref >60)
GLUCOSE: 101 mg/dL — AB (ref 65–99)
Potassium: 3.6 mmol/L (ref 3.5–5.1)
SODIUM: 135 mmol/L (ref 135–145)

## 2016-11-10 LAB — GLUCOSE, CAPILLARY
GLUCOSE-CAPILLARY: 104 mg/dL — AB (ref 65–99)
GLUCOSE-CAPILLARY: 105 mg/dL — AB (ref 65–99)
GLUCOSE-CAPILLARY: 137 mg/dL — AB (ref 65–99)
Glucose-Capillary: 90 mg/dL (ref 65–99)
Glucose-Capillary: 122 mg/dL — ABNORMAL HIGH (ref 65–99)
Glucose-Capillary: 114 mg/dL — ABNORMAL HIGH (ref 65–99)

## 2016-11-10 LAB — VANCOMYCIN, TROUGH: Vancomycin Tr: 11 ug/mL — ABNORMAL LOW (ref 15–20)

## 2016-11-10 MED ORDER — PRO-STAT SUGAR FREE PO LIQD
30.0000 mL | Freq: Two times a day (BID) | ORAL | Status: DC
  Administered 2016-11-10 – 2016-11-14 (×6): 30 mL
  Filled 2016-11-10 (×7): qty 30

## 2016-11-10 MED ORDER — OXYCODONE HCL 5 MG PO TABS
10.0000 mg | ORAL_TABLET | ORAL | Status: DC | PRN
Start: 2016-11-10 — End: 2016-11-11

## 2016-11-10 MED ORDER — JEVITY 1.2 CAL PO LIQD
1000.0000 mL | ORAL | Status: DC
  Administered 2016-11-10 – 2016-11-11 (×2): 1000 mL
  Filled 2016-11-10 (×7): qty 1000

## 2016-11-10 NOTE — Progress Notes (Signed)
11/10/2016 10:02 AM  [name] [id number]  Post-Op Day 2    Temp:  [98.8 F (37.1 C)-99.4 F (37.4 C)] 98.8 F (37.1 C) (02/26 0500) Pulse Rate:  [81-96] 84 (02/26 0500) Resp:  [18-20] 20 (02/26 0500) BP: (137-154)/(64-73) 144/73 (02/26 0500) SpO2:  [92 %-97 %] 94 % (02/26 0500),    No intake or output data in the 24 hours ending 11/10/16 1002  Results for orders placed or performed during the hospital encounter of 11/06/16 (from the past 24 hour(s))  Glucose, capillary     Status: Abnormal   Collection Time: 11/09/16 11:36 AM  Result Value Ref Range   Glucose-Capillary 103 (H) 65 - 99 mg/dL  Glucose, capillary     Status: Abnormal   Collection Time: 11/09/16  4:28 PM  Result Value Ref Range   Glucose-Capillary 126 (H) 65 - 99 mg/dL  Glucose, capillary     Status: Abnormal   Collection Time: 11/09/16  8:17 PM  Result Value Ref Range   Glucose-Capillary 124 (H) 65 - 99 mg/dL   Comment 1 Notify RN    Comment 2 Document in Chart   Glucose, capillary     Status: Abnormal   Collection Time: 11/10/16 12:00 AM  Result Value Ref Range   Glucose-Capillary 108 (H) 65 - 99 mg/dL   Comment 1 Notify RN    Comment 2 Document in Chart   Glucose, capillary     Status: Abnormal   Collection Time: 11/10/16  4:13 AM  Result Value Ref Range   Glucose-Capillary 104 (H) 65 - 99 mg/dL   Comment 1 Notify RN    Comment 2 Document in Chart   Glucose, capillary     Status: None   Collection Time: 11/10/16  7:25 AM  Result Value Ref Range   Glucose-Capillary 90 65 - 99 mg/dL   Comment 1 Notify RN    Comment 2 Document in Chart     SUBJECTIVE:  Less neck and chest pain.  No antecedent event.   Hungry!  OBJECTIVE:  Min drainage on dressing.  Voice cl.  Breathing well.    IMPRESSION:  Satisfactory check.  PLAN:   Although gastrografin swallow did not show leak, presence of air bubbles more likely from leak than from gas forming infection.  Will repeat gastrografin, then barium  swallow in 1-2 days.  If still no leak, then should be able to take po alimentation.  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    Jodi Marble

## 2016-11-10 NOTE — Progress Notes (Signed)
    Subjective: Procedure(s) (LRB): INCISION AND DRAINAGE OF NECK ABSCESS (N/A) 2 Days Post-Op  Patient reports pain as 3 on 0-10 scale.  Reports deferred at this time arm pain reports incisional neck pain   Positive void Negative bowel movement Positive flatus Negative chest pain or shortness of breath  Objective: Vital signs in last 24 hours: Temp:  [98.8 F (37.1 C)-99.4 F (37.4 C)] 98.8 F (37.1 C) (02/26 0500) Pulse Rate:  [81-96] 84 (02/26 0500) Resp:  [18-20] 20 (02/26 0500) BP: (137-154)/(64-73) 144/73 (02/26 0500) SpO2:  [92 %-97 %] 94 % (02/26 0500)  Intake/Output from previous day: No intake/output data recorded.  Labs:  Recent Labs  11/08/16 0241  WBC 8.7  RBC 4.60  HCT 38.3  PLT 299    Recent Labs  11/08/16 0241 11/09/16 0219  NA 134* 134*  K 3.2* 3.4*  CL 97* 98*  CO2 25 27  BUN 7 5*  CREATININE 0.53 0.46  GLUCOSE 143* 148*  CALCIUM 9.1 8.5*   No results for input(s): LABPT, INR in the last 72 hours.  Physical Exam: Neurologically intact Intact pulses distally Incision: no drainage Compartment soft Thoracic pain resolved Ambulating without difficulty  Assessment/Plan: Patient stable  xrays n/a Mobilization with physical therapy Encourage incentive spirometry Continue care  Plan:  1. ID - patient had very bad experience with PICC line and does not want another one.  Question if we can treat infection with chronic suppressive oral abxs.  Will continue IV vanco for now.    A. CRP is 1.9 (nl is <1)  Will recheck in 1 week  B. ESR 54 (nl 0-22)  C. Will recheck WBC in AM.  Encouraged by the fact that she has remained   afebrile 2. ENT: per Dr [name] last note can start tube feeds.  Will order nutritional consult for recommendations.  Will defer need for 2nd swallow study to ENT. 3. Pain control improved with addition of robaxin.  Will start oxycodone per NG tube 4. DM stable   Melina Schools, MD [place] [phone]

## 2016-11-10 NOTE — Progress Notes (Signed)
Initial Nutrition Assessment   INTERVENTION:  Initiate Jevity 1.2 @ 20 ml/hr via NGT and increase by 10 ml every 4 hours to goal rate of 50 ml/hr.   30 ml Prostat BID.    Tube feeding regimen provides 1640 kcal (100% of needs), 97 grams of protein, and 972 ml of H2O.   When IV fluids are discontinued, recommend adding 130 ml free water flushes every 4 hours  NUTRITION DIAGNOSIS:   Inadequate oral intake related to inability to eat as evidenced by NPO status.   GOAL:   Patient will meet greater than or equal to 90% of their needs   MONITOR:   TF tolerance, Weight trends, Skin, I & O's, Labs, Diet advancement  REASON FOR ASSESSMENT:   Consult Enteral/tube feeding initiation and management  ASSESSMENT:   71 y.o. female presented with a history of bilateral shoulder and arm pain tha has been ongoing for about two weeks now.  Onset of symptoms was gradual starting 2 weeks ago with gradually worsening course since that time.   Recommended to have a CT scan which was performed earlier today. The scan showed revertebral thickening and gas bubbles from C3 to T2 consistent  2 days s/p INCISION AND DRAINAGE OF NECK ABSCESS  NGT in place. Pt states that she never has much of an appetite. She usually eats a donut for breakfast, a half of a sandwich for lunch, and a small dinner. She denies any weight loss, stating that most recently she has been weighing 149 lbs. Weight history shows steady weight gain in the past 16 months. Pt states that she had a G-tube in August. Per medical chart this was August of 2016. Per RN, pt no longer has G-tube.   Labs: low chloride, low calcium  Diet Order:  Diet NPO time specified  Skin:  Wound (see comment) (closed incision on neck)  Last BM:  2/24  Height:   Ht Readings from Last 1 Encounters:  11/06/16 5\' 2"  (1.575 m)    Weight:   Wt Readings from Last 1 Encounters:  11/06/16 155 lb 6.4 oz (70.5 kg)    Ideal Body Weight:  50 kg  BMI:   Body mass index is 28.42 kg/m.  Estimated Nutritional Needs:   Kcal:  1550-1800  Protein:  92-106 grams  Fluid:  1.8 L/day  EDUCATION NEEDS:   No education needs identified at this time  Scarlette Ar RD, LDN, CSP Inpatient Clinical Dietitian Pager: [phone] After Hours Pager: [phone]

## 2016-11-10 NOTE — Progress Notes (Signed)
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  She had a protracted course of liquid levaquin and then was off by January 2017.  She comes in now with shoulder and arm pain and CT scan of neck suggests prevertebral thickening and gas bubbles concerning for abscess and disc changes concerning for discitis/osteomyelitis as above. Patient is status post placement of a national gastric feeding tube and exploration of left neck with incision and drainage of prevertebral abscess by ENT on 2/24.   TRH consulted for the management of diabetes.  Assessment & Plan:   #Type 2 diabetes: -Patient has A1c level of 6.4 on 10/15/2015. I will add A1c level today. -Patient is currently on insulin sliding scale every 4 hours. Blood sugar level acceptable. Plan to start naso-gastric tube feed. Continue to monitor blood sugar level closely.  #Hypertension: Cozaar currently on hold. Blood pressure acceptable. Continue to monitor.  #Hypokalemia: Monitor BMP. Currently on potassium chloride 20 mEq.  #History of COPD: Stable now. Continue bronchodilators.  # Neck abscess and chronic pharyngoesophageal dysphagia: On meropenem and vancomycin as per Id. -Further management as per primary team/surgeons.  #Anxiety disorder: On Xanax as needed.  Principal Problem:   Abscess of neck Active Problems:   Diabetes mellitus type 2, insulin dependent (HCC)  DVT prophylaxis: SCDs. Anticoagulation as per primary team/surgeons Code Status: Full code Family  Communication: No family present at bedside Disposition Plan: As per primary team.  Procedures: Surgery Antimicrobials: Vancomycin and meropenem per ID.  Subjective: Patient was seen and examined at bedside. Patient reported back spasms improving with Neurontin. Denied nausea vomiting chest pain or shortness of breath.  Objective: Vitals:   11/09/16 2100 11/10/16 0100 11/10/16 0500 11/10/16 1000  BP: (!) 143/67 137/72 (!) 144/73 (!) 141/73  Pulse: 89 96 84 64  Resp: 18 18 20 20   Temp: 99.4 F (37.4 C) 99.1 F (37.3 C) 98.8 F (37.1 C) 98.8 F (37.1 C)  TempSrc: Oral Oral Oral Oral  SpO2: 94% 92% 94% 94%  Weight:      Height:       No intake or output data in the 24 hours ending 11/10/16 1249 Filed Weights   11/06/16 2052  Weight: 70.5 kg (155 lb 6.4 oz)    Examination:  General exam: Ill-looking female sitting on bed with nasogastric tube. Respiratory system: Clear bilateral. Respiratory effort normal. No wheezing or crackle Cardiovascular system: S1 & S2 heard, RRR.  No pedal edema. Gastrointestinal system: Abdomen is nondistended, soft and nontender. Normal bowel sounds heard. Central nervous system: Alert and oriented. No focal neurological deficits. Extremities: Symmetric 5 x 5 power. Skin: No rashes, lesions or ulcers Psychiatry: Judgement and insight appear normal. Mood & affect appropriate.     Data Reviewed: I have personally reviewed following labs and imaging studies  CBC:  Recent Labs Lab 11/06/16 2337 11/08/16 0241  WBC 11.9* 8.7  NEUTROABS 9.5*  --   HGB 13.1 13.4  HCT 37.9 38.3  MCV 84.2 83.3  PLT 274 299   Basic Metabolic Panel:  Recent Labs Lab 11/06/16 2337 11/08/16 0241 11/09/16  0219  NA 133* 134* 134*  K 3.8 3.2* 3.4*  CL 96* 97* 98*  CO2 25 25 27   GLUCOSE 133* 143* 148*  BUN 9 7 5*  CREATININE 0.67 0.53 0.46  CALCIUM 9.1 9.1 8.5*   GFR: Estimated Creatinine Clearance: 61.9 mL/min (by C-G formula based on SCr of 0.46  mg/dL). Liver Function Tests:  Recent Labs Lab 11/06/16 2337 11/08/16 0241  AST 15 17  ALT 10* 10*  ALKPHOS 128* 128*  BILITOT 0.5 0.8  PROT 7.1 7.3  ALBUMIN 3.3* 3.0*   No results for input(s): LIPASE, AMYLASE in the last 168 hours. No results for input(s): AMMONIA in the last 168 hours. Coagulation Profile:  Recent Labs Lab 11/06/16 2337  INR 1.07   Cardiac Enzymes: No results for input(s): CKTOTAL, CKMB, CKMBINDEX, TROPONINI in the last 168 hours. BNP (last 3 results) No results for input(s): PROBNP in the last 8760 hours. HbA1C: No results for input(s): HGBA1C in the last 72 hours. CBG:  Recent Labs Lab 11/09/16 2017 11/10/16 0000 11/10/16 0413 11/10/16 0725 11/10/16 1217  GLUCAP 124* 108* 104* 90 105*   Lipid Profile: No results for input(s): CHOL, HDL, LDLCALC, TRIG, CHOLHDL, LDLDIRECT in the last 72 hours. Thyroid Function Tests: No results for input(s): TSH, T4TOTAL, FREET4, T3FREE, THYROIDAB in the last 72 hours. Anemia Panel: No results for input(s): VITAMINB12, FOLATE, FERRITIN, TIBC, IRON, RETICCTPCT in the last 72 hours. Sepsis Labs: No results for input(s): PROCALCITON, LATICACIDVEN in the last 168 hours.  Recent Results (from the past 240 hour(s))  Anaerobic culture     Status: None (Preliminary result)   Collection Time: 11/08/16  2:10 PM  Result Value Ref Range Status   Specimen Description WOUND NECK  Final   Special Requests NONE  Final   Culture   Final    NO ANAEROBES ISOLATED; CULTURE IN PROGRESS FOR 5 DAYS   Report Status PENDING  Incomplete  Aerobic Culture (superficial specimen)     Status: None (Preliminary result)   Collection Time: 11/08/16  2:10 PM  Result Value Ref Range Status   Specimen Description WOUND NECK  Final   Special Requests NONE  Final   Gram Stain   Final    RARE WBC PRESENT, PREDOMINANTLY MONONUCLEAR NO ORGANISMS SEEN    Culture CULTURE REINCUBATED FOR BETTER GROWTH  Final   Report Status PENDING   Incomplete         Radiology Studies: Dg Chest Port 1 View  Result Date: 11/08/2016 CLINICAL DATA:  71 y/o  F; incision and drainage of neck abscess. EXAM: PORTABLE CHEST 1 VIEW COMPARISON:  01/02/2016 chest radiograph. FINDINGS: Stable cardiomegaly. Aortic atherosclerosis with calcification. Enteric tube tip below field of view and abdomen. Right axillary surgical clips. Left lung bases partially outside field of view. Visualized lung fields are clear. Curvilinear density in left base of neck, probably bandaging. Bones are unremarkable. IMPRESSION: Stable cardiomegaly. Aortic atherosclerosis. No acute pulmonary process identified. Electronically Signed   By: Mitzi Hansen M.D.   On: 11/08/2016 17:56   Dg Abd Portable 1v  Result Date: 11/08/2016 CLINICAL DATA:  71 y/o  F; enteric tube placement. EXAM: PORTABLE ABDOMEN - 1 VIEW COMPARISON:  None. FINDINGS: Insert 2 tip projects over gastric body. Retained contrast within the colon. Severe dextrocurvature and degenerative changes of the lumbar spine. Partially visualized total hip arthroplasty. Nonobstructive bowel gas pattern. IMPRESSION: Enteric tube tip projects over the gastric body. Electronically Signed   By: Buzzy Han.D.  On: 11/08/2016 17:59        Scheduled Meds: . feeding supplement (PRO-STAT SUGAR FREE 64)  30 mL Per Tube BID  . gabapentin  600 mg Per Tube QID  . insulin aspart  0-9 Units Subcutaneous Q4H  . meropenem (MERREM) IV  1 g Intravenous Q8H  . mometasone-formoterol  2 puff Inhalation BID  . nicotine  21 mg Transdermal Daily  . pantoprazole (PROTONIX) IV  40 mg Intravenous Q24H  . potassium chloride  20 mEq Per Tube Daily  . vancomycin  1,000 mg Intravenous Q12H   Continuous Infusions: . 0.9 % NaCl with KCl 40 mEq / L 75 mL/hr (11/09/16 2316)  . feeding supplement (JEVITY 1.2 CAL) 1,000 mL (11/10/16 1208)  . lactated ringers       LOS: 4 days    Rendi Mapel Jaynie Collins, MD [facility] Pager [phone]  If 7PM-7AM, please contact night-coverage [email or web address] Password [facility] 11/10/2016, 12:49 PM

## 2016-11-10 NOTE — Care Management Important Message (Signed)
Important Message  Patient Details  Name: [name] MRN: [id number] Date of Birth: 1946-06-25   Medicare Important Message Given:  Yes    Orbie Pyo 11/10/2016, 3:13 PM

## 2016-11-11 ENCOUNTER — Inpatient Hospital Stay (HOSPITAL_COMMUNITY): Payer: Medicare Other

## 2016-11-11 LAB — CBC WITH DIFFERENTIAL/PLATELET
BASOS ABS: 0 10*3/uL (ref 0.0–0.1)
Basophils Relative: 0 %
EOS ABS: 0.2 10*3/uL (ref 0.0–0.7)
Eosinophils Relative: 2 %
HCT: 36.9 % (ref 36.0–46.0)
HEMOGLOBIN: 12.6 g/dL (ref 12.0–15.0)
LYMPHS ABS: 1.6 10*3/uL (ref 0.7–4.0)
LYMPHS PCT: 20 %
MCH: 28.8 pg (ref 26.0–34.0)
MCHC: 34.1 g/dL (ref 30.0–36.0)
MCV: 84.2 fL (ref 78.0–100.0)
Monocytes Absolute: 0.8 10*3/uL (ref 0.1–1.0)
Monocytes Relative: 10 %
NEUTROS PCT: 68 %
Neutro Abs: 5.5 10*3/uL (ref 1.7–7.7)
Platelets: 286 10*3/uL (ref 150–400)
RBC: 4.38 MIL/uL (ref 3.87–5.11)
RDW: 14.3 % (ref 11.5–15.5)
WBC: 8.1 10*3/uL (ref 4.0–10.5)

## 2016-11-11 LAB — HEMOGLOBIN A1C
HEMOGLOBIN A1C: 5.8 % — AB (ref 4.8–5.6)
MEAN PLASMA GLUCOSE: 120 mg/dL

## 2016-11-11 LAB — AEROBIC CULTURE W GRAM STAIN (SUPERFICIAL SPECIMEN)

## 2016-11-11 LAB — AEROBIC CULTURE  (SUPERFICIAL SPECIMEN): CULTURE: NORMAL

## 2016-11-11 LAB — GLUCOSE, CAPILLARY
GLUCOSE-CAPILLARY: 129 mg/dL — AB (ref 65–99)
GLUCOSE-CAPILLARY: 136 mg/dL — AB (ref 65–99)
GLUCOSE-CAPILLARY: 148 mg/dL — AB (ref 65–99)
Glucose-Capillary: 156 mg/dL — ABNORMAL HIGH (ref 65–99)
Glucose-Capillary: 132 mg/dL — ABNORMAL HIGH (ref 65–99)
Glucose-Capillary: 137 mg/dL — ABNORMAL HIGH (ref 65–99)

## 2016-11-11 MED ORDER — SODIUM CHLORIDE 0.9% FLUSH: 10.0000 mL | INTRAVENOUS | Status: DC | PRN

## 2016-11-11 MED ORDER — IOPAMIDOL (ISOVUE-300) INJECTION 61%
INTRAVENOUS | Status: AC
  Administered 2016-11-11: 140 mL via ORAL
  Filled 2016-11-11: qty 150

## 2016-11-11 MED ORDER — LOPERAMIDE HCL 2 MG PO CAPS
2.0000 mg | ORAL_CAPSULE | Freq: Four times a day (QID) | ORAL | Status: DC | PRN
  Administered 2016-11-11 – 2016-11-13 (×5): 2 mg via ORAL
  Filled 2016-11-11 (×6): qty 1

## 2016-11-11 MED ORDER — OXYCODONE HCL 5 MG PO TABS
20.0000 mg | ORAL_TABLET | ORAL | Status: DC | PRN
  Administered 2016-11-11 – 2016-11-16 (×15): 20 mg
  Filled 2016-11-11 (×16): qty 4

## 2016-11-11 MED ORDER — IOPAMIDOL (ISOVUE-300) INJECTION 61%
150.0000 mL | Freq: Once | INTRAVENOUS | Status: AC | PRN
  Administered 2016-11-11: 140 mL via ORAL

## 2016-11-11 MED ORDER — VANCOMYCIN HCL 10 G IV SOLR
1500.0000 mg | Freq: Two times a day (BID) | INTRAVENOUS | Status: DC
  Administered 2016-11-11 – 2016-11-12 (×4): 1500 mg via INTRAVENOUS
  Filled 2016-11-11 (×5): qty 1500

## 2016-11-11 NOTE — Progress Notes (Signed)
Nutrition Follow-up  INTERVENTION:  Continue Jevity 1.2 @ goal rate of 50 ml/hr via NGT.  30 ml Prostat BID.    Tube feeding regimen provides 1640 kcal (100% of needs), 97 grams of protein, and 972 ml of H2O.   When IV fluids are discontinued, recommend adding 130 ml free water flushes every 4 hours   NUTRITION DIAGNOSIS:   Inadequate oral intake related to inability to eat as evidenced by NPO status.  Ongoing  GOAL:   Patient will meet greater than or equal to 90% of their needs  Unmet  MONITOR:   TF tolerance, Weight trends, Skin, I & O's, Labs, Diet advancement  REASON FOR ASSESSMENT:   Consult Enteral/tube feeding initiation and management  ASSESSMENT:   71 y.o. female presented with a history of bilateral shoulder and arm pain tha has been ongoing for about two weeks now.  Onset of symptoms was gradual starting 2 weeks ago with gradually worsening course since that time.   Recommended to have a CT scan which was performed earlier today. The scan showed revertebral thickening and gas bubbles from C3 to T2 consistent  Pt is tolerating tube feeding well. Jevity 1.2 infusing continuously at goal rate of 50 ml/hr at time of visit. Pt reports feeling well. She states that she will most likely need a PEG- she doesn't want to go home with NGT. RD offered transitioning to bolus tube feedings and explained what bolus means, but pt would like to continue continuous feeds at this time.   Labs reviewed.   Diet Order:  Diet NPO time specified  Skin:  Wound (see comment) (closed neck incision)  Last BM:  2/26  Height:   Ht Readings from Last 1 Encounters:  11/06/16 5\' 2"  (1.575 m)    Weight:   Wt Readings from Last 1 Encounters:  11/11/16 136 lb 9 oz (61.9 kg)    Ideal Body Weight:  50 kg  BMI:  Body mass index is 24.98 kg/m.  Estimated Nutritional Needs:   Kcal:  1550-1800  Protein:  92-106 grams  Fluid:  1.8 L/day  EDUCATION NEEDS:   No education  needs identified at this time  Scarlette Ar RD, LDN, CSP Inpatient Clinical Dietitian Pager: [phone] After Hours Pager: [phone]

## 2016-11-11 NOTE — Progress Notes (Addendum)
[facility]    Date of Admission:  11/06/2016   Total days of antibiotics 6        Day 6 vanco        Day 6 mero           ID: [name] is a 71 y.o. female with   CT findings concerning for osteomyelitis of discs at C7, T1 and T2.  Also concern for abscess on CT, none reported intraoperatively.  No sign of esophageal leak.  Principal Problem:   Abscess of neck Active Problems:   Diabetes mellitus type 2, insulin dependent (HCC)    Subjective: Remains afebrile.she is not looking forward to doing iv abtx again since she did roughly 3 months last fall with dr Lucianne Lei dam. She is have loose stools. Had 4 yesterday that she attributes to vancomycin  Mild growth noted from cx drawn on 2/24. ID still pending  Medications:  . feeding supplement (PRO-STAT SUGAR FREE 64)  30 mL Per Tube BID  . gabapentin  600 mg Per Tube QID  . insulin aspart  0-9 Units Subcutaneous Q4H  . meropenem (MERREM) IV  1 g Intravenous Q8H  . mometasone-formoterol  2 puff Inhalation BID  . nicotine  21 mg Transdermal Daily  . pantoprazole (PROTONIX) IV  40 mg Intravenous Q24H  . potassium chloride  20 mEq Per Tube Daily  . vancomycin  1,500 mg Intravenous Q12H    Objective: Vital signs in last 24 hours: Temp:  [97.7 F (36.5 C)-98.8 F (37.1 C)] 97.7 F (36.5 C) (02/27 0519) Pulse Rate:  [55-65] 60 (02/27 0519) Resp:  [16-20] 18 (02/27 0519) BP: (134-156)/(64-84) 134/64 (02/27 0519) SpO2:  [93 %-98 %] 98 % (02/27 0519) Weight:  [136 lb 9 oz (61.9 kg)] 136 lb 9 oz (61.9 kg) (02/27 0500) Physical Exam  Constitutional:  oriented to person, place, and time. appears well-developed and well-nourished. No distress.  HENT: [place]/AT, PERRLA, no scleral icterus Mouth/Throat: Oropharynx is clear and moist. No oropharyngeal exudate. Ng in place Cardiovascular: Normal rate, regular rhythm and normal heart sounds. Exam reveals no gallop and no friction rub.  No murmur heard.    Pulmonary/Chest: Effort normal and breath sounds normal. No respiratory distress.  has no wheezes.  Neck = wrapped from surgery Abdominal: Soft. Bowel sounds are normal.  exhibits no distension. There is no tenderness.  Lymphadenopathy: no cervical adenopathy. No axillary adenopathy Neurological: alert and oriented to person, place, and time.  Skin: Skin is warm and dry. No rash noted. No erythema.  Psychiatric: a normal mood and affect.  behavior is normal.    Lab Results  Recent Labs  11/09/16 0219 11/10/16 1201  NA 134* 135  K 3.4* 3.6  CL 98* 101  CO2 27 24  BUN 5* 5*  CREATININE 0.46 0.42*   Liver Panel No results for input(s): PROT, ALBUMIN, AST, ALT, ALKPHOS, BILITOT, BILIDIR, IBILI in the last 72 hours. Sedimentation Rate  Recent Labs  11/08/16 0831  ESRSEDRATE 52*   C-Reactive Protein  Recent Labs  11/08/16 0831  CRP 1.9*    Microbiology: 2/24 nect wound = cx incubating Studies/Results: IMPRESSION: 1. Interval increase in size of extensive prevertebral and retropharyngeal gas and fluid collection extending from C2 through T2. This is highly concerning for infection with extension into the superior mediastinum putting the patient at risk for mediastinitis. 2. Decreased mineralization at C7, T1, and T2 compatible with known osteomyelitis. Lytic areas are  evident. 3. Sclerosis and fusion at C3-4 and C4-5. 4. Nonunion at C5-6. 5. 7 mm cavitary nodule in the right upper lobe. Non-contrast chest CT at 6-12 months is recommended. 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 Await results for recent culture. Will likely need to repeat iv abtx for minimum 6 wk  Diarrhea = no other symptoms to suggest cdiff. Recommend to do tiral of immodium to see if  helps her symptoms [facility] Cell: [phone] Pager: [phone]  11/11/2016, 8:00 AM

## 2016-11-11 NOTE — Care Management Note (Signed)
Case Management Note  Patient Details  Name: [name] MRN: [id number] Date of Birth: 01-01-1946  Subjective/Objective:                    Action/Plan: Patient plan continues to be home when medically ready. CM following for d/c needs.  Expected Discharge Date:                  Expected Discharge Plan:     In-House Referral:     Discharge planning Services     Post Acute Care Choice:    Choice offered to:     DME Arranged:    DME Agency:     HH Arranged:    HH Agency:     Status of Service:  In process, will continue to follow  If discussed at Long Length of Stay Meetings, dates discussed:    Additional Comments:  Pollie Friar, RN 11/11/2016, 4:04 PM

## 2016-11-11 NOTE — Progress Notes (Signed)
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  She had a protracted course of liquid levaquin and then was off by January 2017.  She comes in now with shoulder and arm pain and CT scan of neck suggests prevertebral thickening and gas bubbles concerning for abscess and disc changes concerning for discitis/osteomyelitis as above. Patient is status post placement of a national gastric feeding tube and exploration of left neck with incision and drainage of prevertebral abscess by ENT on 2/24.   TRH consulted for the management of diabetes.  Assessment & Plan:   #Type 2 diabetes: -Patient has A1c level of 6.4 of 5.8 on 2/26.  -Patient is currently on insulin sliding scale every 4 hours. Blood sugar level acceptable. Evaluation for feeding ongoing by ENT and primary team.  #Hypertension:  elevation in blood pressure today. May need to resume Cozaar when patient is able to take pills ? Tube feed. Blood pressure acceptable. Continue to monitor. -now on hydralazine IV as needed.  #Hypokalemia: Monitor BMP. Currently on potassium chloride 20 mEq.  #History of COPD: Stable now. Continue bronchodilators.  # Neck abscess and chronic pharyngoesophageal dysphagia: On meropenem and vancomycin as per Id. -Further management as per primary team/surgeons.  #Anxiety disorder: On Xanax as needed.  Principal Problem:   Abscess of neck Active Problems:   Diabetes mellitus type 2, insulin dependent (HCC)  DVT prophylaxis: SCDs. Anticoagulation as  per primary team/surgeons Code Status: Full code Family Communication: No family present at bedside Disposition Plan: As per primary team.  Procedures: Surgery Antimicrobials: Vancomycin and meropenem per ID.  Subjective: Patient was seen and examined at bedside. Denied nausea, vomiting, chest pain or SOB. Objective: Vitals:   11/11/16 0112 11/11/16 0500 11/11/16 0519 11/11/16 0929  BP: (!) 144/70  134/64 (!) 155/70  Pulse: (!) 55  60 62  Resp: 16  18 15   Temp: 98.2 F (36.8 C)  97.7 F (36.5 C) 98.2 F (36.8 C)  TempSrc: Oral  Oral Oral  SpO2: 95%  98% 97%  Weight:  61.9 kg (136 lb 9 oz)    Height:        Intake/Output Summary (Last 24 hours) at 11/11/16 1213 Last data filed at 11/11/16 0610  Gross per 24 hour  Intake             1400 ml  Output                0 ml  Net             1400 ml   Filed Weights   11/06/16 2052 11/11/16 0500  Weight: 70.5 kg (155 lb 6.4 oz) 61.9 kg (136 lb 9 oz)    Examination:  General exam: Ill-looking female sitting on bed with nasogastric tube. Respiratory system: Clear bilateral. Respiratory effort normal. No wheezing or crackle Cardiovascular system: S1 & S2 heard, RRR.  No pedal edema. Gastrointestinal system: Abdomen soft, nontender, nondistended. BS positive. Central nervous system: Alert and oriented. No focal neurological deficits. Extremities: Symmetric 5 x 5 power. Skin: No rashes, lesions or ulcers Psychiatry: Judgement  and insight appear normal. Mood & affect appropriate.     Data Reviewed: I have personally reviewed following labs and imaging studies  CBC:  Recent Labs Lab 11/06/16 2337 11/08/16 0241 11/11/16 0803  WBC 11.9* 8.7 8.1  NEUTROABS 9.5*  --  5.5  HGB 13.1 13.4 12.6  HCT 37.9 38.3 36.9  MCV 84.2 83.3 84.2  PLT 274 299 286   Basic Metabolic Panel:  Recent Labs Lab 11/06/16 2337 11/08/16 0241 11/09/16 0219 11/10/16 1201  NA 133* 134* 134* 135  K 3.8 3.2* 3.4* 3.6  CL 96* 97* 98* 101  CO2  25 25 27 24   GLUCOSE 133* 143* 148* 101*  BUN 9 7 5* 5*  CREATININE 0.67 0.53 0.46 0.42*  CALCIUM 9.1 9.1 8.5* 8.7*   GFR: Estimated Creatinine Clearance: 58.2 mL/min (by C-G formula based on SCr of 0.42 mg/dL (L)). Liver Function Tests:  Recent Labs Lab 11/06/16 2337 11/08/16 0241  AST 15 17  ALT 10* 10*  ALKPHOS 128* 128*  BILITOT 0.5 0.8  PROT 7.1 7.3  ALBUMIN 3.3* 3.0*   No results for input(s): LIPASE, AMYLASE in the last 168 hours. No results for input(s): AMMONIA in the last 168 hours. Coagulation Profile:  Recent Labs Lab 11/06/16 2337  INR 1.07   Cardiac Enzymes: No results for input(s): CKTOTAL, CKMB, CKMBINDEX, TROPONINI in the last 168 hours. BNP (last 3 results) No results for input(s): PROBNP in the last 8760 hours. HbA1C:  Recent Labs  11/10/16 1330  HGBA1C 5.8*   CBG:  Recent Labs Lab 11/10/16 1921 11/10/16 2337 11/11/16 0329 11/11/16 0731 11/11/16 1110  GLUCAP 114* 137* 156* 129* 132*   Lipid Profile: No results for input(s): CHOL, HDL, LDLCALC, TRIG, CHOLHDL, LDLDIRECT in the last 72 hours. Thyroid Function Tests: No results for input(s): TSH, T4TOTAL, FREET4, T3FREE, THYROIDAB in the last 72 hours. Anemia Panel: No results for input(s): VITAMINB12, FOLATE, FERRITIN, TIBC, IRON, RETICCTPCT in the last 72 hours. Sepsis Labs: No results for input(s): PROCALCITON, LATICACIDVEN in the last 168 hours.  Recent Results (from the past 240 hour(s))  Anaerobic culture     Status: None (Preliminary result)   Collection Time: 11/08/16  2:10 PM  Result Value Ref Range Status   Specimen Description WOUND NECK  Final   Special Requests NONE  Final   Culture   Final    NO ANAEROBES ISOLATED; CULTURE IN PROGRESS FOR 5 DAYS   Report Status PENDING  Incomplete  Aerobic Culture (superficial specimen)     Status: None (Preliminary result)   Collection Time: 11/08/16  2:10 PM  Result Value Ref Range Status   Specimen Description WOUND NECK  Final     Special Requests NONE  Final   Gram Stain   Final    RARE WBC PRESENT, PREDOMINANTLY MONONUCLEAR NO ORGANISMS SEEN    Culture CULTURE REINCUBATED FOR BETTER GROWTH  Final   Report Status PENDING  Incomplete         Radiology Studies: Dg Esophagus W/water Sol Cm  Result Date: 11/11/2016 CLINICAL DATA:  Followup esophageal leak. EXAM: ESOPHOGRAM/BARIUM SWALLOW TECHNIQUE: Single contrast examination was performed using water-soluble contrast. 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 There is evidence of leakage of contrast material out along the base of the  Zenker's and into the prevertebral space at C7 and T1. There is also leakage of contrast material superiorly along the C3,C4 level. In retrospect on the prior study I believe there may have been leakage of contrast inferiorly from the base of the Zenkers. IMPRESSION: Evidence of leaking contrast material both superiorly and inferiorly from the Zenker's diverticulum into the prevertebral space. Electronically Signed   By: Rudie Meyer M.D.   On: 11/11/2016 10:02        Scheduled Meds: . feeding supplement (PRO-STAT SUGAR FREE 64)  30 mL Per Tube BID  . gabapentin  600 mg Per Tube QID  . insulin aspart  0-9 Units Subcutaneous Q4H  . meropenem (MERREM) IV  1 g Intravenous Q8H  . mometasone-formoterol  2 puff Inhalation BID  . nicotine  21 mg Transdermal Daily  . potassium chloride  20 mEq Per Tube Daily  . vancomycin  1,500 mg Intravenous Q12H   Continuous Infusions: . 0.9 % NaCl with KCl 40 mEq / L 75 mL/hr (11/10/16 1701)  . feeding supplement (JEVITY 1.2 CAL) 1,000 mL (11/10/16 1208)  . lactated ringers       LOS: 5 days    Carly Applegate Jaynie Collins, MD [facility] Pager [phone]  If 7PM-7AM, please contact night-coverage [email or web address] Password [facility] 11/11/2016, 12:13 PM

## 2016-11-11 NOTE — Progress Notes (Signed)
11/11/2016 10:38 AM  [name] [id number]     OBJECTIVE:  Gastrografin swallow this AM shows leaking from the superior and inferior aspect of the diverticulum.    IMPRESSION:  Esophageal leak.  Healing probably impeded by DM, smoking, and vertebral body osteomyelitis  PLAN:  Will need PEG tube again.  More receptive to PICC line if no enteral antibiotics are appropriate.  Would need to clear osteo before considering next step with diverticulum.    Jodi Marble

## 2016-11-11 NOTE — Progress Notes (Signed)
    Subjective: 3 Days Post-Op Procedure(s) (LRB): INCISION AND DRAINAGE OF NECK ABSCESS (N/A) Patient reports pain as 2 on 0-10 scale.   Denies CP or SOB.  Voiding without difficulty. Positive flatus. Objective: Vital signs in last 24 hours: Temp:  [97.7 F (36.5 C)-98.8 F (37.1 C)] 97.7 F (36.5 C) (02/27 0519) Pulse Rate:  [55-65] 60 (02/27 0519) Resp:  [16-20] 18 (02/27 0519) BP: (134-156)/(64-84) 134/64 (02/27 0519) SpO2:  [93 %-98 %] 98 % (02/27 0519) Weight:  [61.9 kg (136 lb 9 oz)] 61.9 kg (136 lb 9 oz) (02/27 0500)  Intake/Output from previous day: 02/26 0701 - 02/27 0700 In: 1400 [I.V.:550; NG/GT:500; IV Piggyback:150] Out: -  Intake/Output this shift: No intake/output data recorded.  Labs: No results for input(s): HGB in the last 72 hours. No results for input(s): WBC, RBC, HCT, PLT in the last 72 hours.  Recent Labs  11/09/16 0219 11/10/16 1201  NA 134* 135  K 3.4* 3.6  CL 98* 101  CO2 27 24  BUN 5* 5*  CREATININE 0.46 0.42*  GLUCOSE 148* 101*  CALCIUM 8.5* 8.7*   No results for input(s): LABPT, INR in the last 72 hours.  Physical Exam: Neurologically intact Intact pulses distally No cellulitis present Compartment soft incision: no drainage.  Assessment/Plan: 3 Days Post-Op Procedure(s) (LRB): INCISION AND DRAINAGE OF NECK ABSCESS (N/A) Up with therapy  1. Will order repeat gastrografin swallow per ENT. 2. Defer dressing/wound care to ENT 3. ID to address question of conversion of IV abx to oral regimen 4. Nsg: will monitor I/O's per shift  [name] D for Dr. Melina Schools [facility] [phone] 11/11/2016, 7:43 AM

## 2016-11-11 NOTE — Progress Notes (Signed)
11/11/2016 9:38 AM  [name] [id number]  Post-Op Day 3    Temp:  [97.7 F (36.5 C)-98.8 F (37.1 C)] 98.2 F (36.8 C) (02/27 0929) Pulse Rate:  [55-65] 62 (02/27 0929) Resp:  [15-20] 15 (02/27 0929) BP: (134-156)/(64-84) 155/70 (02/27 0929) SpO2:  [93 %-98 %] 97 % (02/27 0929) Weight:  [61.9 kg (136 lb 9 oz)] 61.9 kg (136 lb 9 oz) (02/27 0500),     Intake/Output Summary (Last 24 hours) at 11/11/16 0938 Last data filed at 11/11/16 0610  Gross per 24 hour  Intake             1400 ml  Output                0 ml  Net             1400 ml    Results for orders placed or performed during the hospital encounter of 11/06/16 (from the past 24 hour(s))  Basic metabolic panel     Status: Abnormal   Collection Time: 11/10/16 12:01 PM  Result Value Ref Range   Sodium 135 135 - 145 mmol/L   Potassium 3.6 3.5 - 5.1 mmol/L   Chloride 101 101 - 111 mmol/L   CO2 24 22 - 32 mmol/L   Glucose, Bld 101 (H) 65 - 99 mg/dL   BUN 5 (L) 6 - 20 mg/dL   Creatinine, Ser 0.42 (L) 0.44 - 1.00 mg/dL   Calcium 8.7 (L) 8.9 - 10.3 mg/dL   GFR calc non Af Amer >60 >60 mL/min   GFR calc Af Amer >60 >60 mL/min   Anion gap 10 5 - 15  Glucose, capillary     Status: Abnormal   Collection Time: 11/10/16 12:17 PM  Result Value Ref Range   Glucose-Capillary 105 (H) 65 - 99 mg/dL   Comment 1 Notify RN    Comment 2 Document in Chart   Hemoglobin A1c     Status: Abnormal   Collection Time: 11/10/16  1:30 PM  Result Value Ref Range   Hgb A1c MFr Bld 5.8 (H) 4.8 - 5.6 %   Mean Plasma Glucose 120 mg/dL  Glucose, capillary     Status: Abnormal   Collection Time: 11/10/16  4:14 PM  Result Value Ref Range   Glucose-Capillary 122 (H) 65 - 99 mg/dL   Comment 1 Notify RN    Comment 2 Document in Chart   Glucose, capillary     Status: Abnormal   Collection Time: 11/10/16  7:21 PM  Result Value Ref Range   Glucose-Capillary 114 (H) 65 - 99 mg/dL   Comment 1 Notify RN    Comment 2 Document in Chart    Vancomycin, trough     Status: Abnormal   Collection Time: 11/10/16 11:20 PM  Result Value Ref Range   Vancomycin Tr 11 (L) 15 - 20 ug/mL  Glucose, capillary     Status: Abnormal   Collection Time: 11/10/16 11:37 PM  Result Value Ref Range   Glucose-Capillary 137 (H) 65 - 99 mg/dL   Comment 1 Notify RN    Comment 2 Document in Chart   Glucose, capillary     Status: Abnormal   Collection Time: 11/11/16  3:29 AM  Result Value Ref Range   Glucose-Capillary 156 (H) 65 - 99 mg/dL   Comment 1 Notify RN    Comment 2 Document in Chart   Glucose, capillary     Status: Abnormal   Collection Time: 11/11/16  7:31 AM  Result Value Ref Range   Glucose-Capillary 129 (H) 65 - 99 mg/dL   Comment 1 Notify RN    Comment 2 Document in Chart   CBC with Differential/Platelet     Status: None   Collection Time: 11/11/16  8:03 AM  Result Value Ref Range   WBC 8.1 4.0 - 10.5 K/uL   RBC 4.38 3.87 - 5.11 MIL/uL   Hemoglobin 12.6 12.0 - 15.0 g/dL   HCT 36.9 36.0 - 46.0 %   MCV 84.2 78.0 - 100.0 fL   MCH 28.8 26.0 - 34.0 pg   MCHC 34.1 30.0 - 36.0 g/dL   RDW 14.3 11.5 - 15.5 %   Platelets 286 150 - 400 K/uL   Neutrophils Relative % 68 %   Neutro Abs 5.5 1.7 - 7.7 K/uL   Lymphocytes Relative 20 %   Lymphs Abs 1.6 0.7 - 4.0 K/uL   Monocytes Relative 10 %   Monocytes Absolute 0.8 0.1 - 1.0 K/uL   Eosinophils Relative 2 %   Eosinophils Absolute 0.2 0.0 - 0.7 K/uL   Basophils Relative 0 %   Basophils Absolute 0.0 0.0 - 0.1 K/uL    SUBJECTIVE:  Pain better. Swallowing saliva.    OBJECTIVE:  ba swallow this AM.  Results not yet available.  Min drainage.  Drains advanced.   IMPRESSION:  Satisfactory check.  Await ba swallow results.  Drains advanced.    PLAN:  Pt would be satisfied to be at her baseline status of 1 mo ago.  For the time being, will not consider addressing the diverticulum formally ( still unclear if this is a pre-morbid Zenker's pulsion diverticulum, or a surgical scar traction  diverticulum).  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    Jodi Marble

## 2016-11-11 NOTE — Progress Notes (Signed)
Pharmacy Antibiotic Note [name] is a 71 y.o. female admitted on 11/06/2016 with abscess of neck. POD 1 from I&D of prevertebral abscess. Currently on day 5 of meropenem and vancomycin for treatment of osteomyelitis/neck abscess.    Vancomycin trough 11 mcg/ml (subtherapeutic) on 1gm IV q12h  SCr remains stable  Plan: 1. Continue meropenem 1 gram IV every 8 hours  2. Change vancomycin to 1500 gm IV every 12 hours 3. Follow up culture data, renal function, and pt's clinical condition 4. Vanc trough at Css on new dose   Height: 5\' 2"  (157.5 cm) Weight: 155 lb 6.4 oz (70.5 kg) IBW/kg (Calculated) : 50.1  Temp (24hrs), Avg:98.6 F (37 C), Min:98 F (36.7 C), Max:99.1 F (37.3 C)   Recent Labs Lab 11/06/16 2337 11/08/16 0241 11/09/16 0219 11/10/16 1201 11/10/16 2320  WBC 11.9* 8.7  --   --   --   CREATININE 0.67 0.53 0.46 0.42*  --   VANCOTROUGH  --   --   --   --  11*    Estimated Creatinine Clearance: 61.9 mL/min (by C-G formula based on SCr of 0.42 mg/dL (L)).    Allergies  Allergen Reactions  . Penicillins Rash    Has patient had a PCN reaction causing immediate rash, facial/tongue/throat swelling, SOB or lightheadedness with hypotension: unsure Has patient had a PCN reaction causing severe rash involving mucus membranes or skin necrosis: unsure Has patient had a PCN reaction that required hospitalization No Has patient had a PCN reaction occurring within the last 10 years: Yes If all of the above answers are "NO", then may proceed with Cephalosporin use.  . Zithromax [Azithromycin Dihydrate] Other (See Comments)    ORAL ULCERS     Antimicrobials this admission: 2/23 Merrem >> 2/23 Vancomycin >>   Microbiology results: 2/24: neck abscess: ngtd   Thank you for allowing pharmacy to be a part of this patient's care.  Sherlon Handing, PharmD, BCPS Clinical pharmacist, pager [phone] 11/11/2016, 12:07 AM

## 2016-11-12 DIAGNOSIS — E876 Hypokalemia: Secondary | ICD-10-CM

## 2016-11-12 LAB — GLUCOSE, CAPILLARY
GLUCOSE-CAPILLARY: 131 mg/dL — AB (ref 65–99)
GLUCOSE-CAPILLARY: 159 mg/dL — AB (ref 65–99)
Glucose-Capillary: 115 mg/dL — ABNORMAL HIGH (ref 65–99)
Glucose-Capillary: 127 mg/dL — ABNORMAL HIGH (ref 65–99)
Glucose-Capillary: 119 mg/dL — ABNORMAL HIGH (ref 65–99)

## 2016-11-12 LAB — BASIC METABOLIC PANEL
Anion gap: 7 (ref 5–15)
BUN: 8 mg/dL (ref 6–20)
CO2: 25 mmol/L (ref 22–32)
CREATININE: 0.46 mg/dL (ref 0.44–1.00)
Calcium: 8.3 mg/dL — ABNORMAL LOW (ref 8.9–10.3)
Chloride: 104 mmol/L (ref 101–111)
GFR calc Af Amer: >60 mL/min (ref >60)
Glucose, Bld: 139 mg/dL — ABNORMAL HIGH (ref 65–99)
Potassium: 3.3 mmol/L — ABNORMAL LOW (ref 3.5–5.1)
SODIUM: 136 mmol/L (ref 135–145)

## 2016-11-12 MED ORDER — CHLORHEXIDINE GLUCONATE 0.12 % MT SOLN
5.0000 mL | Freq: Four times a day (QID) | OROMUCOSAL | Status: DC
  Administered 2016-11-12 – 2016-11-16 (×12): 5 mL via OROMUCOSAL
  Filled 2016-11-12 (×13): qty 15

## 2016-11-12 MED ORDER — SODIUM CHLORIDE 0.9% FLUSH
10.0000 mL | INTRAVENOUS | Status: DC | PRN
  Administered 2016-11-13: 10 mL
  Filled 2016-11-12: qty 40

## 2016-11-12 NOTE — Progress Notes (Signed)
PROGRESS NOTE    KAMLYN SLUPSKI  ZOX:[id number] DOB: Sep 08, 1946 DOA: 11/06/2016 PCP: Redmond Baseman, MD   Brief Narrative: 71 year old female with history of type 2 diabetes with a history of mutlilevel ACDF around 2010 and then cervical spinal infection treated with meropenem in 2016 secondary to esophageal perforation and prolonged antibiotics with epidural abscess extending to the mediastinum that required surgery 05/04/15 with hardware removal and then debridemen in October 2016, no cultures sent.  She had a protracted course of liquid levaquin and then was off by January 2017.  She comes in now with shoulder and arm pain and CT scan of neck suggests prevertebral thickening and gas bubbles concerning for abscess and disc changes concerning for discitis/osteomyelitis as above. Patient is status post placement of a national gastric feeding tube and exploration of left neck with incision and drainage of prevertebral abscess by ENT on 2/24.   TRH consulted for the management of diabetes.  Assessment & Plan:   #Type 2 diabetes: -Patient has A1c level of 5.8 on 2/26.  -Patient is currently on insulin sliding scale every 4 hours. Blood sugar level acceptable. Evaluation for feeding ongoing by ENT and primary team. Plan for PEG tube. Pt reported that she takes lantus 20 units at home. I think the dose needs to be lowered on discharge depending on blood sugar level.   #Hypertension:  elevation in blood pressure today. May need to resume Cozaar 25 mg  when patient is able to take pills ? Tube feed. For now continue IV hydralazine IV as needed. -now on hydralazine IV as needed.  #Hypokalemia: Monitor BMP. Currently on potassium chloride 20 mEq. Also receiving KCL in IVF fluid. Monitor BMP.Check Mg level.   #History of COPD: Stable now. Continue bronchodilators.  # Neck abscess and chronic pharyngoesophageal dysphagia: On meropenem and vancomycin as per Id. -Further management as per primary  team/surgeons.  #Anxiety disorder: On Xanax as needed.  Principal Problem:   Abscess of neck Active Problems:   Diabetes mellitus type 2, insulin dependent (HCC)  DVT prophylaxis: SCDs. Anticoagulation as per primary team/surgeons Code Status: Full code Family Communication: No family present at bedside Disposition Plan: As per primary team.  Procedures: Surgery Antimicrobials: Vancomycin and meropenem per ID.  Subjective: Patient was seen and examined at bedside. NO new event. No cp, sob, nausea or vomiting.  Objective: Vitals:   11/11/16 2117 11/12/16 0102 11/12/16 0522 11/12/16 0931  BP: (!) 153/75 (!) 144/79 (!) 150/88 (!) 161/70  Pulse: (!) 58 (!) 55 (!) 59 60  Resp: 18 18 20 20   Temp: 98.5 F (36.9 C) 98.3 F (36.8 C) 97.5 F (36.4 C) 98.4 F (36.9 C)  TempSrc: Oral Oral Oral Oral  SpO2: 95% 96% 96% 96%  Weight:   61.8 kg (136 lb 3.9 oz)   Height:       No intake or output data in the 24 hours ending 11/12/16 1253 Filed Weights   11/06/16 2052 11/11/16 0500 11/12/16 0522  Weight: 70.5 kg (155 lb 6.4 oz) 61.9 kg (136 lb 9 oz) 61.8 kg (136 lb 3.9 oz)    Examination:  General exam: Ill-looking female sitting on bed with nasogastric tube. Respiratory system: clear bilateral. Respiratory effort normal. No wheezing or crackle Cardiovascular system: S1 & S2 heard, RRR.  No pedal edema. Gastrointestinal system: Abdomen soft, nontender, nondistended. BS positive. Central nervous system: Alert and oriented. No focal neurological deficits. Extremities: Symmetric 5 x 5 power. Skin: No rashes, lesions or ulcers Psychiatry:  Judgement and insight appear normal. Mood & affect appropriate.     Data Reviewed: I have personally reviewed following labs and imaging studies  CBC:  Recent Labs Lab 11/06/16 2337 11/08/16 0241 11/11/16 0803  WBC 11.9* 8.7 8.1  NEUTROABS 9.5*  --  5.5  HGB 13.1 13.4 12.6  HCT 37.9 38.3 36.9  MCV 84.2 83.3 84.2  PLT 274 299 286    Basic Metabolic Panel:  Recent Labs Lab 11/06/16 2337 11/08/16 0241 11/09/16 0219 11/10/16 1201 11/12/16 0514  NA 133* 134* 134* 135 136  K 3.8 3.2* 3.4* 3.6 3.3*  CL 96* 97* 98* 101 104  CO2 25 25 27 24 25   GLUCOSE 133* 143* 148* 101* 139*  BUN 9 7 5* 5* 8  CREATININE 0.67 0.53 0.46 0.42* 0.46  CALCIUM 9.1 9.1 8.5* 8.7* 8.3*   GFR: Estimated Creatinine Clearance: 58.2 mL/min (by C-G formula based on SCr of 0.46 mg/dL). Liver Function Tests:  Recent Labs Lab 11/06/16 2337 11/08/16 0241  AST 15 17  ALT 10* 10*  ALKPHOS 128* 128*  BILITOT 0.5 0.8  PROT 7.1 7.3  ALBUMIN 3.3* 3.0*   No results for input(s): LIPASE, AMYLASE in the last 168 hours. No results for input(s): AMMONIA in the last 168 hours. Coagulation Profile:  Recent Labs Lab 11/06/16 2337  INR 1.07   Cardiac Enzymes: No results for input(s): CKTOTAL, CKMB, CKMBINDEX, TROPONINI in the last 168 hours. BNP (last 3 results) No results for input(s): PROBNP in the last 8760 hours. HbA1C:  Recent Labs  11/10/16 1330  HGBA1C 5.8*   CBG:  Recent Labs Lab 11/11/16 1944 11/11/16 2337 11/12/16 0345 11/12/16 0748 11/12/16 1226  GLUCAP 137* 136* 131* 159* 115*   Lipid Profile: No results for input(s): CHOL, HDL, LDLCALC, TRIG, CHOLHDL, LDLDIRECT in the last 72 hours. Thyroid Function Tests: No results for input(s): TSH, T4TOTAL, FREET4, T3FREE, THYROIDAB in the last 72 hours. Anemia Panel: No results for input(s): VITAMINB12, FOLATE, FERRITIN, TIBC, IRON, RETICCTPCT in the last 72 hours. Sepsis Labs: No results for input(s): PROCALCITON, LATICACIDVEN in the last 168 hours.  Recent Results (from the past 240 hour(s))  Anaerobic culture     Status: None (Preliminary result)   Collection Time: 11/08/16  2:10 PM  Result Value Ref Range Status   Specimen Description WOUND NECK  Final   Special Requests NONE  Final   Culture   Final    NO ANAEROBES ISOLATED; CULTURE IN PROGRESS FOR 5 DAYS    Report Status PENDING  Incomplete  Aerobic Culture (superficial specimen)     Status: None   Collection Time: 11/08/16  2:10 PM  Result Value Ref Range Status   Specimen Description WOUND NECK  Final   Special Requests NONE  Final   Gram Stain   Final    RARE WBC PRESENT, PREDOMINANTLY MONONUCLEAR NO ORGANISMS SEEN    Culture NORMAL SKIN FLORA  Final   Report Status 11/11/2016 FINAL  Final         Radiology Studies: Dg Esophagus W/water Sol Cm  Result Date: 11/11/2016 CLINICAL DATA:  Followup esophageal leak. EXAM: ESOPHOGRAM/BARIUM SWALLOW TECHNIQUE: Single contrast examination was performed using water-soluble contrast. 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 There is evidence of leakage of contrast material  out along the base of the Zenker's and into the prevertebral space at C7 and T1. There is also leakage of contrast material superiorly along the C3,C4 level. In retrospect on the prior study I believe there may have been leakage of contrast inferiorly from the base of the Zenkers. IMPRESSION: Evidence of leaking contrast material both superiorly and inferiorly from the Zenker's diverticulum into the prevertebral space. Electronically Signed   By: Rudie Meyer M.D.   On: 11/11/2016 10:02        Scheduled Meds: . feeding supplement (PRO-STAT SUGAR FREE 64)  30 mL Per Tube BID  . gabapentin  600 mg Per Tube QID  . insulin aspart  0-9 Units Subcutaneous Q4H  . meropenem (MERREM) IV  1 g Intravenous Q8H  . mometasone-formoterol  2 puff Inhalation BID  . nicotine  21 mg Transdermal Daily  . potassium chloride  20 mEq Per Tube Daily  . vancomycin  1,500 mg Intravenous Q12H   Continuous Infusions: . 0.9 % NaCl with KCl 40 mEq  / L 75 mL/hr (11/12/16 0845)  . feeding supplement (JEVITY 1.2 CAL) 1,000 mL (11/11/16 2022)  . lactated ringers       LOS: 6 days    Dron Jaynie Collins, MD [facility] Pager [phone]  If 7PM-7AM, please contact night-coverage [email or web address] Password [facility] 11/12/2016, 12:53 PM

## 2016-11-12 NOTE — Consult Note (Signed)
[facility] CM Primary Care Navigator  11/12/2016  [name] [birth year] [id number]  Met with patient at the bedside to identify possible discharge needs. Patient reports having increased pain to neck, back, shoulder blades which radiates to arms that had led to this admission/surgery.  Patient endorses Dr. Leodis Sias with [facility] at [facility] as her primary care provider.    Patient shared using [facility] at [facility] to obtain medications without difficulty.  Patient reports managing her medications at home with minimal assistance from her husband.   She was able to drive prior to admission/ surgery but husband Annette Stable) or son Clovis Riley) will be able to provide transportation to her doctors' appointments after discharge.  Her husband and son will be her primary caregivers at home as stated.   Anticipated discharge plan is home with husband and son's assistance according to patient.  Patient voiced understanding to call primary care provider's office when she returns home, for a post discharge follow-up appointment within a week or sooner if needs arise. Patient letter (with PCP's contact number) was provided as a reminder.  Discussed with patient regarding [facility] care management services available for health management (DM, COPD and HF) but patient states she had been managing DM and COPD well and just opted to receive onlyEMMI HF calls to help her manage it at home.  For additional questions please contact:  Karin Golden A. Kathrina Crosley, BSN, RN-BC [facility] PRIMARY CARE Navigator Cell: [phone]

## 2016-11-12 NOTE — Progress Notes (Addendum)
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

## 2016-11-12 NOTE — Progress Notes (Signed)
Peripherally Inserted Central Catheter/Midline Placement  The IV Nurse has discussed with the patient and/or persons authorized to consent for the patient, the purpose of this procedure and the potential benefits and risks involved with this procedure.  The benefits include less needle sticks, lab draws from the catheter, and the patient may be discharged home with the catheter. Risks include, but not limited to, infection, bleeding, blood clot (thrombus formation), and puncture of an artery; nerve damage and irregular heartbeat and possibility to perform a PICC exchange if needed/ordered by physician.  Alternatives to this procedure were also discussed.  Bard Power PICC patient education guide, fact sheet on infection prevention and patient information card has been provided to patient /or left at bedside.    PICC/Midline Placement Documentation  PICC Single Lumen 11/12/16 PICC Left Brachial 44 cm (Active)  Indication for Insertion or Continuance of Line Home intravenous therapies (PICC only) 11/12/2016 11:00 AM  Dressing Change Due 11/19/16 11/12/2016 11:00 AM       Jule Economy Horton 11/12/2016, 11:28 AM

## 2016-11-12 NOTE — Progress Notes (Signed)
11/12/2016 5:51 PM  Jeanie Sewer [id number]  Post-Op Day 4    Temp:  [97.5 F (36.4 C)-98.6 F (37 C)] 98.4 F (36.9 C) (02/28 0931) Pulse Rate:  [55-67] 60 (02/28 0931) Resp:  [17-20] 20 (02/28 0931) BP: (144-166)/(70-89) 161/70 (02/28 0931) SpO2:  [95 %-97 %] 96 % (02/28 0931) Weight:  [61.8 kg (136 lb 3.9 oz)] 61.8 kg (136 lb 3.9 oz) (02/28 0522),     Intake/Output Summary (Last 24 hours) at 11/12/16 1751 Last data filed at 11/12/16 0954  Gross per 24 hour  Intake              120 ml  Output                0 ml  Net              120 ml    Results for orders placed or performed during the hospital encounter of 11/06/16 (from the past 24 hour(s))  Glucose, capillary     Status: Abnormal   Collection Time: 11/11/16  7:44 PM  Result Value Ref Range   Glucose-Capillary 137 (H) 65 - 99 mg/dL  Glucose, capillary     Status: Abnormal   Collection Time: 11/11/16 11:37 PM  Result Value Ref Range   Glucose-Capillary 136 (H) 65 - 99 mg/dL   Comment 1 Notify RN    Comment 2 Document in Chart   Glucose, capillary     Status: Abnormal   Collection Time: 11/12/16  3:45 AM  Result Value Ref Range   Glucose-Capillary 131 (H) 65 - 99 mg/dL   Comment 1 Notify RN    Comment 2 Document in Chart   Basic metabolic panel     Status: Abnormal   Collection Time: 11/12/16  5:14 AM  Result Value Ref Range   Sodium 136 135 - 145 mmol/L   Potassium 3.3 (L) 3.5 - 5.1 mmol/L   Chloride 104 101 - 111 mmol/L   CO2 25 22 - 32 mmol/L   Glucose, Bld 139 (H) 65 - 99 mg/dL   BUN 8 6 - 20 mg/dL   Creatinine, Ser 0.46 0.44 - 1.00 mg/dL   Calcium 8.3 (L) 8.9 - 10.3 mg/dL   GFR calc non Af Amer >60 >60 mL/min   GFR calc Af Amer >60 >60 mL/min   Anion gap 7 5 - 15  Glucose, capillary     Status: Abnormal   Collection Time: 11/12/16  7:48 AM  Result Value Ref Range   Glucose-Capillary 159 (H) 65 - 99 mg/dL  Glucose, capillary     Status: Abnormal   Collection Time: 11/12/16 12:26 PM  Result  Value Ref Range   Glucose-Capillary 115 (H) 65 - 99 mg/dL  Glucose, capillary     Status: Abnormal   Collection Time: 11/12/16  4:41 PM  Result Value Ref Range   Glucose-Capillary 127 (H) 65 - 99 mg/dL    SUBJECTIIVE:  No change in symptoms.  PICC line placed today.  OBJECTIVE:  Voice cl. Min drainage.  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  PLAN:  G tube feeding.  PICC line abx.  Peridex oral swish/spit.  Recheck my office 2 weeks.  Repeat gastrografin swallow 4 weeks if stable.  OK for discharge from my standpoint.    Jodi Marble

## 2016-11-12 NOTE — Consult Note (Signed)
[facility] Surgery Consult/Admission Note  [name] Oct 24, 1945  [id number].    Requesting MD: Dr. 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 She had a G tube placed in August of 2016 and it was removed sometime that October. Shepresented with shoulder and arm pain. CT scan of neck concerning for discitis/osteomyelitis. We were consulted for placement of G tube. Pt states she had previous G tube, 3 cesarean sections and no other abdominal surgeries. Patient is not on any anticoagulation. She is not currently complaining of any abdominal pain. She denies chest pain, nausea.  S/P placement of a nasogastric feeding tube and exploration of left neck with incision and drainage of prevertebral abscess by ENT on 2/24.    ROS:  Review of Systems  Constitutional: Negative for chills, diaphoresis and fever.  HENT: Negative for congestion.   Eyes: Negative for pain and discharge.  Respiratory: Positive for cough (at baseline) and shortness of breath (at baseline).   Cardiovascular: Negative for chest pain and palpitations.  Gastrointestinal: Negative for abdominal pain, diarrhea, nausea and vomiting.  Musculoskeletal: Negative for back pain and falls.       Shoulder pain  Skin: Negative for rash.  Neurological: Negative for dizziness, loss of consciousness and headaches.  All other systems reviewed and are negative.    Family History  Problem Relation Age of Onset  . Diabetes Mother   . Hypertension Father   . Heart disease Father   . Heart attack Father   . Stroke Father     Past Medical History:  Diagnosis  Date  . Asthma   . Breast cancer (HCC)    right breast  . COPD (chronic obstructive pulmonary disease) (HCC)   . Coronary artery disease   . Elevated LFTs   . Emphysema lung (HCC)   . ETOH abuse   . H/O atrial fibrillation without current medication    only one time when she had sepsis  . Hyperlipidemia   . Hypertension    hx of but not on any medications  . Myocardial infarction 2000  . OA (osteoarthritis) of knee   . Osteoarthritis   . Tobacco abuse     Past Surgical History:  Procedure Laterality Date  . ANTERIOR HIP REVISION Right 10/22/2015   Procedure: RIGHT  HIP REVISION;  Surgeon: Durene Romans, MD;  Location: [facility];  Service: Orthopedics;  Laterality: Right;  . APPLICATION OF WOUND VAC N/A 06/20/2015   Procedure: APPLICATION OF INCISIONAL WOUND VAC;  Surgeon: Venita Lick, MD;  Location: [facility] OR;  Service: Orthopedics;  Laterality: N/A;  . BREAST SURGERY  1991   right mastectomy  . CARDIAC CATHETERIZATION  04/05/2009   EF 60%  . CARDIOVASCULAR STRESS TEST  01/31/2005   EF 58%  . CESAREAN SECTION  '78, '80, '81   x 3  . CORONARY ANGIOPLASTY  08/1998   x2 OF A BIFURCATION OM-1, OM-2 LESION  . CORONARY ANGIOPLASTY WITH STENT PLACEMENT  01/1999   MID FIRST OBTUSE MARGINAL VESSEL  . CORONARY ANGIOPLASTY WITH STENT PLACEMENT  07/1999   STENTING AT THE CRUX OF THE RIGHT CORONARY ARTERY WITH A 3.8MM X TETRA STENT  . DIRECT  LARYNGOSCOPY N/A 05/03/2015   Procedure: DIRECT LARYNGOSCOPY;  Surgeon: Flo Shanks, MD;  Location: [facility] OR;  Service: ENT;  Laterality: N/A;  . EYE SURGERY  05/18/2014,06/01/2014   BILATERAL CATARACT S WITH LENS IMPLANTS  . GASTROSTOMY N/A 05/04/2015   Procedure: OPEN GASTROSTOMY WITH TUBE PLACEMENT;  Surgeon: Manus Rudd, MD;  Location: [facility] OR;  Service: General;  Laterality: N/A;  . HARDWARE REMOVAL N/A 05/03/2015   Procedure: HARDWARE REMOVAL;  Surgeon: Venita Lick, MD;  Location: [facility] OR;  Service: Orthopedics;  Laterality: N/A;  . HIP CLOSED  REDUCTION Right 04/26/2015   Procedure: CLOSED REDUCTION HIP;  Surgeon: Venita Lick, MD;  Location: [facility];  Service: Orthopedics;  Laterality: Right;  . HYSTEROSCOPY     D & C  . INCISION AND DRAINAGE ABSCESS N/A 05/03/2015   Procedure: INCISION AND DRAINAGE CERVICAL  ABSCESS AND REMOVAL OF HARDWARE;  Surgeon: Venita Lick, MD;  Location: [facility] OR;  Service: Orthopedics;  Laterality: N/A;  . JOINT REPLACEMENT  08/2011   bilateral hip  . JOINT REPLACEMENT  01/2012   right hip  . MASTECTOMY    . neck fusion  2011  . PELVIC LAPAROSCOPY  2002   RSO-    . RADICAL NECK DISSECTION N/A 11/08/2016   Procedure: INCISION AND DRAINAGE OF NECK ABSCESS;  Surgeon: Osborn Coho, MD;  Location: [facility] OR;  Service: ENT;  Laterality: N/A;  . RADIOLOGY WITH ANESTHESIA Right 06/28/2015   Procedure: MRI OF CERVICAL SPINE  AND RIGHT HIP  WITH AND WITHOUT CONTRAST    (RADIOLOGY WITH ANESTHESIA);  Surgeon: Medication Radiologist, MD;  Location: [facility] OR;  Service: Radiology;  Laterality: Right;  . RIGID ESOPHAGOSCOPY N/A 05/03/2015   Procedure: RIGID ESOPHAGOSCOPY;  Surgeon: Flo Shanks, MD;  Location: [facility] OR;  Service: ENT;  Laterality: N/A;  . TONSILLECTOMY AND ADENOIDECTOMY    . TOTAL HIP ARTHROPLASTY  08/2010   bilat  . VULVECTOMY  1981   partial    Social History:  reports that she has been smoking Cigarettes.  She has a 50.00 pack-year smoking history. She has never used smokeless tobacco. She reports that she does not drink alcohol or use drugs.  Allergies:  Allergies  Allergen Reactions  . Penicillins Rash    Has patient had a PCN reaction causing immediate rash, facial/tongue/throat swelling, SOB or lightheadedness with hypotension: unsure Has patient had a PCN reaction causing severe rash involving mucus membranes or skin necrosis: unsure Has patient had a PCN reaction that required hospitalization No Has patient had a PCN reaction occurring within the last 10 years: Yes If all of the above answers  are "NO", then may proceed with Cephalosporin use.  . Zithromax [Azithromycin Dihydrate] Other (See Comments)    ORAL ULCERS     Medications Prior to Admission  Medication Sig Dispense Refill  . albuterol (PROVENTIL HFA;VENTOLIN HFA) 108 (90 BASE) MCG/ACT inhaler Inhale 2 puffs into the lungs every 6 (six) hours as needed for wheezing.     Marland Kitchen ALPRAZolam (XANAX) 0.25 MG tablet Take one tablet daily or as needed (Patient taking differently: Take 0.25 mg by mouth daily as needed for anxiety. ) 90 tablet 3  . furosemide (LASIX) 40 MG tablet Take 1 tablet (40 mg total) by mouth 2 (two) times daily. 90 tablet 0  . gabapentin (NEURONTIN) 600 MG tablet Take 600 mg by mouth 4 (four) times daily.     Marland Kitchen losartan (COZAAR) 25 MG tablet Take 25 mg by mouth daily.   1  .  morphine (MSIR) 30 MG tablet Take 30 mg by mouth 4 (four) times daily.    Marland Kitchen oxyCODONE 10 MG TABS Take 1-3 tablets (10-30 mg total) by mouth every 4 (four) hours as needed for severe pain. (Patient taking differently: Take 15 mg by mouth every 4 (four) hours as needed for severe pain. ) 120 tablet 0  . budesonide-formoterol (SYMBICORT) 160-4.5 MCG/ACT inhaler Inhale 2 puffs into the lungs 2 (two) times daily as needed (Wheezing). Reported on 10/16/2015    . methocarbamol (ROBAXIN) 500 MG tablet Take 1 tablet (500 mg total) by mouth every 6 (six) hours as needed for muscle spasms. (Patient not taking: Reported on 11/07/2016) 40 tablet 0  . Probiotic Product (PROBIOTIC PO) Take 1 capsule by mouth every other day.      Blood pressure (!) 161/70, pulse 60, temperature 98.4 F (36.9 C), temperature source Oral, resp. rate 20, height 5\' 2"  (1.575 m), weight 136 lb 3.9 oz (61.8 kg), SpO2 96 %.  Physical Exam  Constitutional: She is oriented to person, place, and time and well-developed, well-nourished, and in no distress. Vital signs are normal. No distress.  HENT:  Head: Normocephalic and atraumatic.  Nose: Nose normal.  Nasogastric tube in place   Eyes: Conjunctivae are normal. Right eye exhibits no discharge. Left eye exhibits no discharge. No scleral icterus.  Neck:  Bandage on neck appears clean and dry  Cardiovascular: Normal rate, regular rhythm, normal heart sounds and intact distal pulses.  Exam reveals no gallop and no friction rub.   No murmur heard. Pulmonary/Chest: Effort normal and breath sounds normal. No respiratory distress. She has no wheezes. She has no rhonchi. She has no rales.  Abdominal: Soft. Bowel sounds are normal. She exhibits no distension and no mass. There is no tenderness. There is no rigidity, no rebound and no guarding.  Well-healed midline scar noted roughly 2 cm above the umbilicus and extends superiorly for length of roughly 5 cm, small well-healed scar noted to left upper abdomen patient states his previous site of the G-tube, well-healed midline scar below the umbilicus.  Musculoskeletal: Normal range of motion. She exhibits no deformity.  Neurological: She is alert and oriented to person, place, and time.  Skin: Skin is warm and dry. No rash noted. She is not diaphoretic.  Psychiatric: Mood and affect normal.  Nursing note and vitals reviewed.   Results for orders placed or performed during the hospital encounter of 11/06/16 (from the past 48 hour(s))  Glucose, capillary     Status: Abnormal   Collection Time: 11/10/16  4:14 PM  Result Value Ref Range   Glucose-Capillary 122 (H) 65 - 99 mg/dL   Comment 1 Notify RN    Comment 2 Document in Chart   Glucose, capillary     Status: Abnormal   Collection Time: 11/10/16  7:21 PM  Result Value Ref Range   Glucose-Capillary 114 (H) 65 - 99 mg/dL   Comment 1 Notify RN    Comment 2 Document in Chart   Vancomycin, trough     Status: Abnormal   Collection Time: 11/10/16 11:20 PM  Result Value Ref Range   Vancomycin Tr 11 (L) 15 - 20 ug/mL  Glucose, capillary     Status: Abnormal   Collection Time: 11/10/16 11:37 PM  Result Value Ref Range    Glucose-Capillary 137 (H) 65 - 99 mg/dL   Comment 1 Notify RN    Comment 2 Document in Chart   Glucose, capillary  Status: Abnormal   Collection Time: 11/11/16  3:29 AM  Result Value Ref Range   Glucose-Capillary 156 (H) 65 - 99 mg/dL   Comment 1 Notify RN    Comment 2 Document in Chart   Glucose, capillary     Status: Abnormal   Collection Time: 11/11/16  7:31 AM  Result Value Ref Range   Glucose-Capillary 129 (H) 65 - 99 mg/dL   Comment 1 Notify RN    Comment 2 Document in Chart   CBC with Differential/Platelet     Status: None   Collection Time: 11/11/16  8:03 AM  Result Value Ref Range   WBC 8.1 4.0 - 10.5 K/uL   RBC 4.38 3.87 - 5.11 MIL/uL   Hemoglobin 12.6 12.0 - 15.0 g/dL   HCT 16.1 09.6 - 04.5 %   MCV 84.2 78.0 - 100.0 fL   MCH 28.8 26.0 - 34.0 pg   MCHC 34.1 30.0 - 36.0 g/dL   RDW 40.9 81.1 - 91.4 %   Platelets 286 150 - 400 K/uL   Neutrophils Relative % 68 %   Neutro Abs 5.5 1.7 - 7.7 K/uL   Lymphocytes Relative 20 %   Lymphs Abs 1.6 0.7 - 4.0 K/uL   Monocytes Relative 10 %   Monocytes Absolute 0.8 0.1 - 1.0 K/uL   Eosinophils Relative 2 %   Eosinophils Absolute 0.2 0.0 - 0.7 K/uL   Basophils Relative 0 %   Basophils Absolute 0.0 0.0 - 0.1 K/uL  Glucose, capillary     Status: Abnormal   Collection Time: 11/11/16 11:10 AM  Result Value Ref Range   Glucose-Capillary 132 (H) 65 - 99 mg/dL   Comment 1 Notify RN    Comment 2 Document in Chart   Glucose, capillary     Status: Abnormal   Collection Time: 11/11/16  4:39 PM  Result Value Ref Range   Glucose-Capillary 148 (H) 65 - 99 mg/dL   Comment 1 Notify RN    Comment 2 Document in Chart   Glucose, capillary     Status: Abnormal   Collection Time: 11/11/16  7:44 PM  Result Value Ref Range   Glucose-Capillary 137 (H) 65 - 99 mg/dL  Glucose, capillary     Status: Abnormal   Collection Time: 11/11/16 11:37 PM  Result Value Ref Range   Glucose-Capillary 136 (H) 65 - 99 mg/dL   Comment 1 Notify RN     Comment 2 Document in Chart   Glucose, capillary     Status: Abnormal   Collection Time: 11/12/16  3:45 AM  Result Value Ref Range   Glucose-Capillary 131 (H) 65 - 99 mg/dL   Comment 1 Notify RN    Comment 2 Document in Chart   Basic metabolic panel     Status: Abnormal   Collection Time: 11/12/16  5:14 AM  Result Value Ref Range   Sodium 136 135 - 145 mmol/L   Potassium 3.3 (L) 3.5 - 5.1 mmol/L   Chloride 104 101 - 111 mmol/L   CO2 25 22 - 32 mmol/L   Glucose, Bld 139 (H) 65 - 99 mg/dL   BUN 8 6 - 20 mg/dL   Creatinine, Ser 7.82 0.44 - 1.00 mg/dL   Calcium 8.3 (L) 8.9 - 10.3 mg/dL   GFR calc non Af Amer >60 >60 mL/min   GFR calc Af Amer >60 >60 mL/min    Comment: (NOTE) The eGFR has been calculated using the CKD EPI equation. This calculation has not  been validated in all clinical situations. eGFR's persistently <60 mL/min signify possible Chronic Kidney Disease.    Anion gap 7 5 - 15  Glucose, capillary     Status: Abnormal   Collection Time: 11/12/16  7:48 AM  Result Value Ref Range   Glucose-Capillary 159 (H) 65 - 99 mg/dL  Glucose, capillary     Status: Abnormal   Collection Time: 11/12/16 12:26 PM  Result Value Ref Range   Glucose-Capillary 115 (H) 65 - 99 mg/dL   Dg Esophagus W/water Sol Cm  Result Date: 11/11/2016 CLINICAL DATA:  Followup esophageal leak. EXAM: ESOPHOGRAM/BARIUM SWALLOW TECHNIQUE: Single contrast examination was performed using water-soluble contrast. 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 There is evidence of leakage of contrast material out along the base of the Zenker's and into the prevertebral space at C7 and T1. There is also leakage of contrast material superiorly along the C3,C4 level. In  retrospect on the prior study I believe there may have been leakage of contrast inferiorly from the base of the Zenkers. IMPRESSION: Evidence of leaking contrast material both superiorly and inferiorly from the Zenker's diverticulum into the prevertebral space. Electronically Signed   By: Rudie Meyer M.D.   On: 11/11/2016 10:02      Assessment/Plan  G-tube placement - Patient had previous G-tube placed in 2016 - Will discuss timing of G-tube placement with M.D.  We will continue to follow this patient. Thank you for the consult.  Jerre Simon, [facility] Surgery 11/12/2016, 4:05 PM Pager: [phone] Consults: [phone] Mon-Fri 7:00 am-4:30 pm Sat-Sun 7:00 am-11:30 am

## 2016-11-12 NOTE — Progress Notes (Signed)
Patient refused to receive all of her insulin cover for the night.  Patient's CBG was 137 at 1944 on 11/11/16, 138 at 2337 on 11/11/16, and 131 at [place] on 11/12/16.  RN educated patient on the importance of having insulin coverage.  Patient advised that she was not willing to be stuck for just 1 or 2 units of insulin.  RN will continue to monitor patient.

## 2016-11-12 NOTE — Progress Notes (Signed)
Subjective: 4 Days Post-Op Procedure(s) (LRB): INCISION AND DRAINAGE OF NECK ABSCESS (N/A) Patient reports pain as 5 on 0-10 scale.  Back spasms Denies CP or SOB.  Pt reports burning diarrhea.  Urinating without difficulty.  Pt is walking in hall and moving from bed to chair.  ENT changed her dressing yesterday.  Objective: Vital signs in last 24 hours: Temp:  [97.5 F (36.4 C)-98.6 F (37 C)] 97.5 F (36.4 C) (02/28 0522) Pulse Rate:  [55-70] 59 (02/28 0522) Resp:  [15-20] 20 (02/28 0522) BP: (144-166)/(64-89) 150/88 (02/28 0522) SpO2:  [95 %-99 %] 96 % (02/28 0522) Weight:  [61.8 kg (136 lb 3.9 oz)] 61.8 kg (136 lb 3.9 oz) (02/28 0522)  Intake/Output from previous day: No intake/output data recorded. Intake/Output this shift: No intake/output data recorded.  Labs:  Recent Labs  11/11/16 0803  HGB 12.6    Recent Labs  11/11/16 0803  WBC 8.1  RBC 4.38  HCT 36.9  PLT 286    Recent Labs  11/10/16 1201 11/12/16 0514  NA 135 136  K 3.6 3.3*  CL 101 104  CO2 24 25  BUN 5* 8  CREATININE 0.42* 0.46  GLUCOSE 101* 139*  CALCIUM 8.7* 8.3*   No results for input(s): LABPT, INR in the last 72 hours.  Physical Exam: Neurologically intact ABD soft Sensation intact distally Incision: no drainage Compartment soft  Assessment/Plan: 4 Days Post-Op Procedure(s) (LRB): INCISION AND DRAINAGE OF NECK ABSCESS (N/A) Pt has agreed to having PEG tube placed They are waiting on"ok" from ID to place PICC line.  I spoke to Dr. Ilsa Iha and she said she would do so this am.     Kirt Boys for Dr. Venita Lick [facility] [phone] 11/12/2016, 8:45 AM    Patient ID: [name], female   DOB: Sep 23, 1945, 71 y.o.   MRN: [id number]

## 2016-11-13 ENCOUNTER — Inpatient Hospital Stay (HOSPITAL_COMMUNITY): Payer: Medicare Other | Admitting: Certified Registered Nurse Anesthetist

## 2016-11-13 ENCOUNTER — Encounter (HOSPITAL_COMMUNITY): Payer: Self-pay | Admitting: Certified Registered Nurse Anesthetist

## 2016-11-13 ENCOUNTER — Encounter (HOSPITAL_COMMUNITY)
Admission: AD | Disposition: A | Discharge status: Home Health | Payer: Self-pay | Source: Ambulatory Visit | Attending: Orthopedic Surgery

## 2016-11-13 DIAGNOSIS — Z95828 Presence of other vascular implants and grafts: Secondary | ICD-10-CM

## 2016-11-13 HISTORY — PX: GASTROSTOMY: SHX5249

## 2016-11-13 LAB — GLUCOSE, CAPILLARY
GLUCOSE-CAPILLARY: 101 mg/dL — AB (ref 65–99)
GLUCOSE-CAPILLARY: 113 mg/dL — AB (ref 65–99)
GLUCOSE-CAPILLARY: 114 mg/dL — AB (ref 65–99)
GLUCOSE-CAPILLARY: 115 mg/dL — AB (ref 65–99)
GLUCOSE-CAPILLARY: 90 mg/dL (ref 65–99)
Glucose-Capillary: 135 mg/dL — ABNORMAL HIGH (ref 65–99)
Glucose-Capillary: 140 mg/dL — ABNORMAL HIGH (ref 65–99)
Glucose-Capillary: 88 mg/dL (ref 65–99)

## 2016-11-13 LAB — BASIC METABOLIC PANEL
Anion gap: 6 (ref 5–15)
BUN: 6 mg/dL (ref 6–20)
CALCIUM: 8.4 mg/dL — AB (ref 8.9–10.3)
CHLORIDE: 106 mmol/L (ref 101–111)
CO2: 24 mmol/L (ref 22–32)
CREATININE: 0.32 mg/dL — AB (ref 0.44–1.00)
GFR calc Af Amer: >60 mL/min (ref >60)
GFR calc non Af Amer: >60 mL/min (ref >60)
GLUCOSE: 116 mg/dL — AB (ref 65–99)
Potassium: 4.1 mmol/L (ref 3.5–5.1)
Sodium: 136 mmol/L (ref 135–145)

## 2016-11-13 LAB — MAGNESIUM: Magnesium: 1.8 mg/dL (ref 1.7–2.4)

## 2016-11-13 LAB — VANCOMYCIN, TROUGH: VANCOMYCIN TR: 17 ug/mL (ref 15–20)

## 2016-11-13 LAB — ANAEROBIC CULTURE

## 2016-11-13 SURGERY — INSERTION OF GASTROSTOMY TUBE
Anesthesia: General | Site: Abdomen

## 2016-11-13 MED ORDER — ONDANSETRON HCL 4 MG/2ML IJ SOLN
INTRAMUSCULAR | Status: DC | PRN
  Administered 2016-11-13: 4 mg via INTRAVENOUS

## 2016-11-13 MED ORDER — ONDANSETRON HCL 4 MG/2ML IJ SOLN
INTRAMUSCULAR | Status: AC
  Filled 2016-11-13: qty 2

## 2016-11-13 MED ORDER — VANCOMYCIN HCL 10 G IV SOLR
1500.0000 mg | Freq: Two times a day (BID) | INTRAVENOUS | Status: DC
  Administered 2016-11-13 – 2016-11-16 (×7): 1500 mg via INTRAVENOUS
  Filled 2016-11-13 (×8): qty 1500

## 2016-11-13 MED ORDER — LIDOCAINE 2% (20 MG/ML) 5 ML SYRINGE
INTRAMUSCULAR | Status: AC
  Filled 2016-11-13: qty 30

## 2016-11-13 MED ORDER — LIDOCAINE HCL (CARDIAC) 20 MG/ML IV SOLN
INTRAVENOUS | Status: DC | PRN
Start: 2016-11-13 — End: 2016-11-13
  Administered 2016-11-13: 50 mg via INTRAVENOUS

## 2016-11-13 MED ORDER — OXYCODONE HCL 5 MG PO TABS: 5.0000 mg | ORAL_TABLET | Freq: Once | ORAL | Status: DC | PRN

## 2016-11-13 MED ORDER — PHENYLEPHRINE 40 MCG/ML (10ML) SYRINGE FOR IV PUSH (FOR BLOOD PRESSURE SUPPORT)
PREFILLED_SYRINGE | INTRAVENOUS | Status: AC
  Filled 2016-11-13: qty 10

## 2016-11-13 MED ORDER — PROPOFOL 10 MG/ML IV BOLUS
INTRAVENOUS | Status: AC
  Filled 2016-11-13: qty 20

## 2016-11-13 MED ORDER — PROPOFOL 10 MG/ML IV BOLUS
INTRAVENOUS | Status: DC | PRN
  Administered 2016-11-13: 130 mg via INTRAVENOUS

## 2016-11-13 MED ORDER — ROCURONIUM BROMIDE 100 MG/10ML IV SOLN
INTRAVENOUS | Status: DC | PRN
  Administered 2016-11-13: 15 mg via INTRAVENOUS

## 2016-11-13 MED ORDER — ONDANSETRON HCL 4 MG/2ML IJ SOLN: 4.0000 mg | Freq: Once | INTRAMUSCULAR | Status: DC | PRN

## 2016-11-13 MED ORDER — 0.9 % SODIUM CHLORIDE (POUR BTL) OPTIME
TOPICAL | Status: DC | PRN
  Administered 2016-11-13: 1000 mL

## 2016-11-13 MED ORDER — MORPHINE SULFATE (PF) 2 MG/ML IV SOLN
1.0000 mg | INTRAVENOUS | Status: DC | PRN
  Administered 2016-11-13: 2 mg via INTRAVENOUS
  Administered 2016-11-13 – 2016-11-16 (×27): 4 mg via INTRAVENOUS
  Filled 2016-11-13 (×26): qty 2
  Filled 2016-11-13: qty 1
  Filled 2016-11-13 (×2): qty 2

## 2016-11-13 MED ORDER — FENTANYL CITRATE (PF) 100 MCG/2ML IJ SOLN
INTRAMUSCULAR | Status: DC | PRN
  Administered 2016-11-13 (×2): 50 ug via INTRAVENOUS
  Administered 2016-11-13: 100 ug via INTRAVENOUS

## 2016-11-13 MED ORDER — BUPIVACAINE HCL (PF) 0.25 % IJ SOLN
INTRAMUSCULAR | Status: DC | PRN
  Administered 2016-11-13: 10 mL

## 2016-11-13 MED ORDER — SUCCINYLCHOLINE CHLORIDE 20 MG/ML IJ SOLN
INTRAMUSCULAR | Status: DC | PRN
  Administered 2016-11-13: 100 mg via INTRAVENOUS

## 2016-11-13 MED ORDER — FENTANYL CITRATE (PF) 100 MCG/2ML IJ SOLN
25.0000 ug | INTRAMUSCULAR | Status: DC | PRN
  Administered 2016-11-13 (×2): 50 ug via INTRAVENOUS

## 2016-11-13 MED ORDER — MIDAZOLAM HCL 2 MG/2ML IJ SOLN
INTRAMUSCULAR | Status: AC
  Filled 2016-11-13: qty 2

## 2016-11-13 MED ORDER — MIDAZOLAM HCL 5 MG/5ML IJ SOLN
INTRAMUSCULAR | Status: DC | PRN
  Administered 2016-11-13: 2 mg via INTRAVENOUS

## 2016-11-13 MED ORDER — FENTANYL CITRATE (PF) 100 MCG/2ML IJ SOLN
INTRAMUSCULAR | Status: AC
  Filled 2016-11-13: qty 2

## 2016-11-13 MED ORDER — OXYCODONE HCL 5 MG/5ML PO SOLN: 5.0000 mg | Freq: Once | ORAL | Status: DC | PRN

## 2016-11-13 MED ORDER — LACTATED RINGERS IV SOLN
INTRAVENOUS | Status: DC
  Administered 2016-11-13: 50 mL/h via INTRAVENOUS

## 2016-11-13 MED ORDER — FENTANYL CITRATE (PF) 100 MCG/2ML IJ SOLN
INTRAMUSCULAR | Status: AC
  Filled 2016-11-13: qty 4

## 2016-11-13 MED ORDER — FENTANYL CITRATE (PF) 100 MCG/2ML IJ SOLN
INTRAMUSCULAR | Status: AC
  Administered 2016-11-13: 50 ug via INTRAVENOUS
  Filled 2016-11-13: qty 2

## 2016-11-13 MED ORDER — BUPIVACAINE HCL (PF) 0.25 % IJ SOLN
INTRAMUSCULAR | Status: AC
  Filled 2016-11-13: qty 30

## 2016-11-13 MED ORDER — SUGAMMADEX SODIUM 200 MG/2ML IV SOLN
INTRAVENOUS | Status: DC | PRN
  Administered 2016-11-13: 158 mg via INTRAVENOUS

## 2016-11-13 SURGICAL SUPPLY — 34 items
BLADE CLIPPER SURG (BLADE) IMPLANT
CANISTER SUCT 3000ML PPV (MISCELLANEOUS) ×3 IMPLANT
CHLORAPREP W/TINT 26ML (MISCELLANEOUS) ×3 IMPLANT
DRAPE LAPAROSCOPIC ABDOMINAL (DRAPES) ×3 IMPLANT
DRSG COVADERM 4X6 (GAUZE/BANDAGES/DRESSINGS) IMPLANT
DRSG COVADERM 4X8 (GAUZE/BANDAGES/DRESSINGS) ×2 IMPLANT
ELECT REM PT RETURN 9FT ADLT (ELECTROSURGICAL) ×3
ELECTRODE REM PT RTRN 9FT ADLT (ELECTROSURGICAL) ×1 IMPLANT
GLOVE BIO SURGEON STRL SZ7 (GLOVE) ×2 IMPLANT
GLOVE BIOGEL PI IND STRL 7.0 (GLOVE) IMPLANT
GLOVE BIOGEL PI INDICATOR 7.0 (GLOVE) ×2
GLOVE SURG SIGNA 7.5 PF LTX (GLOVE) ×3 IMPLANT
GOWN STRL REUS W/ TWL LRG LVL3 (GOWN DISPOSABLE) ×1 IMPLANT
GOWN STRL REUS W/ TWL XL LVL3 (GOWN DISPOSABLE) ×1 IMPLANT
GOWN STRL REUS W/TWL LRG LVL3 (GOWN DISPOSABLE) ×3
GOWN STRL REUS W/TWL XL LVL3 (GOWN DISPOSABLE) ×3
KIT BASIN OR (CUSTOM PROCEDURE TRAY) ×3 IMPLANT
KIT ROOM TURNOVER OR (KITS) ×3 IMPLANT
NDL HYPO 25GX1X1/2 BEV (NEEDLE) ×1 IMPLANT
NEEDLE HYPO 25GX1X1/2 BEV (NEEDLE) ×3 IMPLANT
NS IRRIG 1000ML POUR BTL (IV SOLUTION) ×4 IMPLANT
PACK GENERAL/GYN (CUSTOM PROCEDURE TRAY) ×3 IMPLANT
PAD ARMBOARD 7.5X6 YLW CONV (MISCELLANEOUS) ×3 IMPLANT
STAPLER VISISTAT 35W (STAPLE) ×3 IMPLANT
SUT NOVA NAB GS-21 0 18 T12 DT (SUTURE) ×2 IMPLANT
SUT PDS II 0 TP-1 LOOPED 60 (SUTURE) ×3 IMPLANT
SUT SILK 2 0 SH CR/8 (SUTURE) IMPLANT
SUT SILK 2 0 TIES 10X30 (SUTURE) IMPLANT
SYR 5ML LUER SLIP (SYRINGE) ×2 IMPLANT
SYR CONTROL 10ML LL (SYRINGE) ×3 IMPLANT
SYRINGE TOOMEY DISP (SYRINGE) ×3 IMPLANT
TOWEL OR 17X24 6PK STRL BLUE (TOWEL DISPOSABLE) ×3 IMPLANT
TOWEL OR 17X26 10 PK STRL BLUE (TOWEL DISPOSABLE) ×3 IMPLANT
TUBE MIC GASTROSTOMY 16FR (TUBING) ×2 IMPLANT

## 2016-11-13 NOTE — Anesthesia Preprocedure Evaluation (Signed)
Anesthesia Evaluation  Patient identified by MRN, date of birth, ID band Patient awake    Reviewed: Allergy & Precautions, NPO status , Patient's Chart, lab work & pertinent test results  Airway Mallampati: II  TM Distance: >3 FB Neck ROM: Full    Dental  (+) Edentulous Upper, Edentulous Lower   Pulmonary Current Smoker,    breath sounds clear to auscultation       Cardiovascular hypertension,  Rhythm:Regular Rate:Normal     Neuro/Psych    GI/Hepatic   Endo/Other    Renal/GU      Musculoskeletal   Abdominal   Peds  Hematology   Anesthesia Other Findings   Reproductive/Obstetrics                             Anesthesia Physical Anesthesia Plan  ASA: III  Anesthesia Plan: General   Post-op Pain Management:    Induction: Intravenous  Airway Management Planned: Oral ETT  Additional Equipment:   Intra-op Plan:   Post-operative Plan: Extubation in OR  Informed Consent: I have reviewed the patients History and Physical, chart, labs and discussed the procedure including the risks, benefits and alternatives for the proposed anesthesia with the patient or authorized representative who has indicated his/her understanding and acceptance.     Plan Discussed with: CRNA and Anesthesiologist  Anesthesia Plan Comments:         Anesthesia Quick Evaluation  

## 2016-11-13 NOTE — Progress Notes (Signed)
PHARMACY CONSULT NOTE FOR:  OUTPATIENT  PARENTERAL ANTIBIOTIC THERAPY (OPAT)  Indication: neck abscess / osteomyelitis Regimen: Meropenem 1g q8 IV hours; Vancomycin 1500mg  IV q12 hours End date: 01/02/17  IV antibiotic discharge orders are pended. To discharging provider:  please sign these orders via discharge navigator,  Select New Orders & click on the button choice - Manage This Unsigned Work.     Thank you for allowing pharmacy to be a part of this patient's care.  Erin Hearing PharmD., BCPS Clinical Pharmacist Pager [phone] 11/13/2016 12:44 PM

## 2016-11-13 NOTE — Anesthesia Procedure Notes (Signed)
Procedure Name: Intubation Date/Time: 11/13/2016 3:10 PM Performed by: Mariea Clonts Pre-anesthesia Checklist: Patient identified, Emergency Drugs available, Suction available and Patient being monitored Patient Re-evaluated:Patient Re-evaluated prior to inductionOxygen Delivery Method: Circle System Utilized Preoxygenation: Pre-oxygenation with 100% oxygen Intubation Type: IV induction Ventilation: Mask ventilation without difficulty Laryngoscope Size: Miller and 2 Grade View: Grade II Tube type: Oral Tube size: 7.0 mm Number of attempts: 1 Airway Equipment and Method: Stylet and Oral airway Placement Confirmation: ETT inserted through vocal cords under direct vision,  positive ETCO2 and breath sounds checked- equal and bilateral Tube secured with: Tape Dental Injury: Teeth and Oropharynx as per pre-operative assessment

## 2016-11-13 NOTE — Progress Notes (Signed)
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  She had a protracted course of liquid levaquin and then was off by January 2017.  She comes in now with shoulder and arm pain and CT scan of neck suggests prevertebral thickening and gas bubbles concerning for abscess and disc changes concerning for discitis/osteomyelitis as above. Patient is status post placement of a national gastric feeding tube and exploration of left neck with incision and drainage of prevertebral abscess by ENT on 2/24.   TRH consulted for the management of diabetes.  Assessment & Plan:   #Type 2 diabetes: -Patient has A1c level of 5.8 on 2/26.  -Patient is currently on insulin sliding scale every 4 hours. Blood sugar level has been is stable and patient is not really requiring insulin. She is currently on naso-gastric feeding. Plan for PEG tube placement by surgery team. I discussed with the patient regarding diabetes management at bedside. Patient reported that she has supplies at home to monitor blood sugar level and usually takes Lantus 20 units. I advised patient to continue to monitor blood sugar level and take lower dose of Lantus if blood sugar level more than 150. She seems to understand very well. I asked her to follow-up with her PCP for diabetes management after discharge.  #Hypertension: Able to resume home dose of losartan when she has a PEG tube placement. Currently on IV hydralazine as needed. Recommended to follow up with PCP and monitor blood pressure at  home.  #Hypokalemia: Improved in a repeat lab. Serum electrolytes expected to improve when patient is started on feeding. Recommended to monitor labs in a week after discharge and follow-up with PCP.  #History of COPD: Stable now. Continue bronchodilators.  # Neck abscess and chronic pharyngoesophageal dysphagia: On meropenem and vancomycin as per Id. patient will be discharged with prolonged IV antibiotics as per infectious disease team. -Further management as per primary team/surgeons.  #Anxiety disorder: On Xanax as needed.  I discussed with the patient regarding the management of diabetes hypertension at bedside in detail. Patient is clinically stable from medical standpoint. I will sign off today. I recommended patient to follow-up with PCP in a week after discharge from the patient. Please call us back if we can be of any help. Thank you very much for this consult.  Principal Problem:   Abscess of neck Active Problems:   Diabetes mellitus type 2, insulin dependent (HCC)  DVT prophylaxis: SCDs. Anticoagulation as per primary team/surgeons Code Status: Full code Family Communication: No family present at bedside Disposition Plan: As per primary team.  Procedures: Surgery Antimicrobials: Vancomycin and meropenem per ID.  Subjective: Patient was seen and examined at bedside. No new event. Patient reported that she will probably has PEG tube today. Denied headache, dizziness, nausea, vomiting, chest pain or shortness of breath. Objective: Vitals:   11/13/16 0038 11/13/16 0450 11/13/16 0603 11/13/16 0927  BP: (!) 146/73  (!) 141/71 (!) 143/74  Pulse: (!) 56  65 66  Resp: 20  20 16   Temp: 97.5 F (36.4 C)  98.2 F (36.8 C) 98.5 F (36.9  C)  TempSrc: Oral  Oral Oral  SpO2: 95%  98% 97%  Weight:  79 kg (174 lb 2.6 oz)    Height:        Intake/Output Summary (Last 24 hours) at 11/13/16 1049 Last data filed at 11/13/16 0500  Gross per 24 hour  Intake             1965 ml    Output                0 ml  Net             1965 ml   Filed Weights   11/11/16 0500 11/12/16 0522 11/13/16 0450  Weight: 61.9 kg (136 lb 9 oz) 61.8 kg (136 lb 3.9 oz) 79 kg (174 lb 2.6 oz)    Examination:  General exam: Not in distress, sitting on bed comfortable, patient has nasogastric tube. Respiratory system: Clear bilateral. Respiratory effort normal. No wheezing or crackle Cardiovascular system: Regular rate rhythm, S1-S2 normal.  No pedal edema. Gastrointestinal system: Abdomen soft, nontender, nondistended. Bowel sound positive Central nervous system: Alert and oriented. No focal neurological deficits. Extremities: Symmetric 5 x 5 power. Skin: No rashes, lesions or ulcers Psychiatry: Judgement and insight appear normal. Mood & affect appropriate.     Data Reviewed: I have personally reviewed following labs and imaging studies  CBC:  Recent Labs Lab 11/06/16 2337 11/08/16 0241 11/11/16 0803  WBC 11.9* 8.7 8.1  NEUTROABS 9.5*  --  5.5  HGB 13.1 13.4 12.6  HCT 37.9 38.3 36.9  MCV 84.2 83.3 84.2  PLT 274 299 286   Basic Metabolic Panel:  Recent Labs Lab 11/08/16 0241 11/09/16 0219 11/10/16 1201 11/12/16 0514 11/13/16 0419  NA 134* 134* 135 136 136  K 3.2* 3.4* 3.6 3.3* 4.1  CL 97* 98* 101 104 106  CO2 25 27 24 25 24   GLUCOSE 143* 148* 101* 139* 116*  BUN 7 5* 5* 8 6  CREATININE 0.53 0.46 0.42* 0.46 0.32*  CALCIUM 9.1 8.5* 8.7* 8.3* 8.4*  MG  --   --   --   --  1.8   GFR: Estimated Creatinine Clearance: 65.6 mL/min (by C-G formula based on SCr of 0.32 mg/dL (L)). Liver Function Tests:  Recent Labs Lab 11/06/16 2337 11/08/16 0241  AST 15 17  ALT 10* 10*  ALKPHOS 128* 128*  BILITOT 0.5 0.8  PROT 7.1 7.3  ALBUMIN 3.3* 3.0*   No results for input(s): LIPASE, AMYLASE in the last 168 hours. No results for input(s): AMMONIA in the last 168 hours. Coagulation Profile:  Recent Labs Lab 11/06/16 2337  INR 1.07   Cardiac Enzymes: No results  for input(s): CKTOTAL, CKMB, CKMBINDEX, TROPONINI in the last 168 hours. BNP (last 3 results) No results for input(s): PROBNP in the last 8760 hours. HbA1C:  Recent Labs  11/10/16 1330  HGBA1C 5.8*   CBG:  Recent Labs Lab 11/12/16 1641 11/12/16 2037 11/13/16 0036 11/13/16 0523 11/13/16 0818  GLUCAP 127* 119* 113* 114* 115*   Lipid Profile: No results for input(s): CHOL, HDL, LDLCALC, TRIG, CHOLHDL, LDLDIRECT in the last 72 hours. Thyroid Function Tests: No results for input(s): TSH, T4TOTAL, FREET4, T3FREE, THYROIDAB in the last 72 hours. Anemia Panel: No results for input(s): VITAMINB12, FOLATE, FERRITIN, TIBC, IRON, RETICCTPCT in the last 72 hours. Sepsis Labs: No results for input(s): PROCALCITON, LATICACIDVEN in the last 168 hours.  Recent Results (from the past 240 hour(s))  Anaerobic culture  Status: None   Collection Time: 11/08/16  2:10 PM  Result Value Ref Range Status   Specimen Description WOUND NECK  Final   Special Requests NONE  Final   Culture NO ANAEROBES ISOLATED  Final   Report Status 11/13/2016 FINAL  Final  Aerobic Culture (superficial specimen)     Status: None   Collection Time: 11/08/16  2:10 PM  Result Value Ref Range Status   Specimen Description WOUND NECK  Final   Special Requests NONE  Final   Gram Stain   Final    RARE WBC PRESENT, PREDOMINANTLY MONONUCLEAR NO ORGANISMS SEEN    Culture NORMAL SKIN FLORA  Final   Report Status 11/11/2016 FINAL  Final         Radiology Studies: No results found.      Scheduled Meds: . chlorhexidine  5 mL Mouth/Throat QID  . feeding supplement (PRO-STAT SUGAR FREE 64)  30 mL Per Tube BID  . gabapentin  600 mg Per Tube QID  . insulin aspart  0-9 Units Subcutaneous Q4H  . meropenem (MERREM) IV  1 g Intravenous Q8H  . mometasone-formoterol  2 puff Inhalation BID  . nicotine  21 mg Transdermal Daily  . potassium chloride  20 mEq Per Tube Daily  . vancomycin  1,500 mg Intravenous Q12H    Continuous Infusions: . 0.9 % NaCl with KCl 40 mEq / L 75 mL/hr (11/12/16 2303)  . feeding supplement (JEVITY 1.2 CAL) Stopped (11/12/16 1952)  . lactated ringers Stopped (11/12/16 2100)     LOS: 7 days    Dron Jaynie Collins, MD [facility] Pager [phone]  If 7PM-7AM, please contact night-coverage [email or web address] Password [facility] 11/13/2016, 10:49 AM

## 2016-11-13 NOTE — Progress Notes (Signed)
Patient ID: [name], female   DOB: [birth year], 71 y.o.   MRN: [id number]   Planing gastrostomy tube insertion today.  I again explained the risks and she agrees to proceed.

## 2016-11-13 NOTE — Progress Notes (Signed)
Pt still does not have up dated tube feeding orders for new PEG tube inserted 3/1 today, most tube feeds do not start immediately after insertion, usually wait at least 24 hrs from insertion date. Pt has old tube feeding orders still active from NG feeding tube which was removed prior to PEG tube placement.

## 2016-11-13 NOTE — Progress Notes (Deleted)
Patient ID: [name], female   DOB: [birth year], 71 y.o.   MRN: [id number]   Pre Procedure note for inpatients:   [name] has been scheduled for Procedure(s): INCISION AND DRAINAGE OF NECK ABSCESS (N/A) today. The various methods of treatment have been discussed with the patient. After consideration of the risks, benefits and treatment options the patient has consented to the planned procedure.   The patient has been seen and labs reviewed. There are no changes in the patient's condition to prevent proceeding with the planned procedure today.  Recent labs:  Lab Results  Component Value Date   WBC 8.1 11/11/2016   HGB 12.6 11/11/2016   HCT 36.9 11/11/2016   PLT 286 11/11/2016   GLUCOSE 116 (H) 11/13/2016   CHOL 164 05/30/2011   TRIG 86.0 05/30/2011   HDL 57.10 05/30/2011   LDLCALC 90 05/30/2011   ALT 10 (L) 11/08/2016   AST 17 11/08/2016   NA 136 11/13/2016   K 4.1 11/13/2016   CL 106 11/13/2016   CREATININE 0.32 (L) 11/13/2016   BUN 6 11/13/2016   CO2 24 11/13/2016   TSH 2.120 08/26/2014   INR 1.07 11/06/2016   HGBA1C 5.8 (H) 11/10/2016    Marisol Giambra A, MD 11/13/2016 2:17 PM

## 2016-11-13 NOTE — Progress Notes (Signed)
Pharmacy Antibiotic Note [name] is a 71 y.o. female admitted on 11/06/2016 with abscess of neck. POD 1 from I&D of prevertebral abscess. Currently on day 7 of meropenem and vancomycin for treatment of osteomyelitis/neck abscess.    Vancomycin trough 17 mcg/ml (therapeutic) on 1500q12 hours.   SCr remains stable  Plan: 1. Continue meropenem 1 gram IV every 8 hours  2. Continue vancomycin to 1500 gm IV every 12 hours - close follow up as outpatient 3. Follow up culture data, renal function, and pt's clinical condition   Height: 5\' 2"  (157.5 cm) Weight: 174 lb 2.6 oz (79 kg) IBW/kg (Calculated) : 50.1  Temp (24hrs), Avg:98.2 F (36.8 C), Min:97.5 F (36.4 C), Max:98.7 F (37.1 C)   Recent Labs Lab 11/06/16 2337 11/08/16 0241 11/09/16 0219 11/10/16 1201 11/10/16 2320 11/11/16 0803 11/12/16 0514 11/13/16 0419 11/13/16 1112  WBC 11.9* 8.7  --   --   --  8.1  --   --   --   CREATININE 0.67 0.53 0.46 0.42*  --   --  0.46 0.32*  --   VANCOTROUGH  --   --   --   --  11*  --   --   --  17    Estimated Creatinine Clearance: 65.6 mL/min (by C-G formula based on SCr of 0.32 mg/dL (L)).    Allergies  Allergen Reactions  . Penicillins Rash    Has patient had a PCN reaction causing immediate rash, facial/tongue/throat swelling, SOB or lightheadedness with hypotension: unsure Has patient had a PCN reaction causing severe rash involving mucus membranes or skin necrosis: unsure Has patient had a PCN reaction that required hospitalization No Has patient had a PCN reaction occurring within the last 10 years: Yes If all of the above answers are "NO", then may proceed with Cephalosporin use.  . Zithromax [Azithromycin Dihydrate] Other (See Comments)    ORAL ULCERS     Antimicrobials this admission: 2/23 Merrem >> 2/23 Vancomycin >>   Microbiology results: 2/24: neck abscess: ngtd   Thank you for allowing pharmacy to be a part of this patient's care.  Erin Hearing  PharmD., BCPS Clinical Pharmacist Pager [phone] 11/13/2016 12:11 PM

## 2016-11-13 NOTE — Progress Notes (Signed)
RN has called [facility]  answering service for MD Ninfa Linden; regarding instructions wether PEG tube can be used for meds. Care instructions are not clear. RN has not yet received a called back from a doctor. PT states " pain is intolerable" .

## 2016-11-13 NOTE — Progress Notes (Signed)
[facility]  New IV ABX pt with [facility] this hospital admission.  [facility] will provide HHRN/HH services and Home Infusion Pharmacy services to support IV ABX at home.  [facility] Coordinator will provide hospital teaching for IV ABX with husband and son to support independence at home.   If patient discharges after hours, please call [phone].   [name] 11/13/2016, 1:26 PM

## 2016-11-13 NOTE — Op Note (Signed)
INSERTION OF GASTROSTOMY TUBE  Procedure Note  MEGA HULME 11/06/2016 - 11/13/2016   Pre-op Diagnosis: Need for tube feeds/ esphegeal abscess     Post-op Diagnosis: same  Procedure(s): INSERTION OF GASTROSTOMY TUBE (16 fr)  Surgeon(s): Coralie Keens, MD  Anesthesia: General  Staff:  Circulator: Christen Bame, RN Scrub Person: Jenelle Mages Panchit  Estimated Blood Loss: Minimal               Procedure: The patient was brought to the operating room and identified as the correct patient. She is placed upon the operating table and general anesthesia was induced. Her abdomen was prepped and draped in the usual sterile fashion. I made [name] small incision in the upper abdomen through her previous scar with [name] scalpel. I took this down to the fascia and peritoneum the entire length of the incision. The patient's stomach was minimally adhered to the abdominal wall from the previous gastrostomy tube.  I took down the adhesions with cautery.  I then made [name] separate incision in the patient's left upper quadrant through the previous scar with [name] scalpel. I then passed it through this incision and into the abdominal cavity. Next [name] 42 [place] feeding tube was brought in and placed through the opening in the abdominal wall into the abdomen. I then placed [name] 3-0 silk pursing suture anterior wall of the stomach. I made [name] small gastrotomy and then placed the feeding tube through this and into the stomach. I then insufflated the balloon with 5 cc of saline. I then used the balloon to pull the stomach against the abdominal wall. I then sewed the stomach to the abdominal wall circumferentially around the G-tube site with interrupted silk sutures. I then secured the gastrostomy tube to the abdominal wall silk sutures. I then closed the patient's midline fascia with figure-of-eight 0 Novafil sutures. The skin was then irrigated. I anesthetized Marcaine. I then closed skin with staples. The patient tolerated procedure well.  All counts were correct at the end of procedure. The patient was then extubated in the operating and taken in [name] stable condition to the recovery room.          [name]   Date: 11/13/2016  Time: 3:41 PM

## 2016-11-13 NOTE — Anesthesia Postprocedure Evaluation (Addendum)
Anesthesia Post Note  Patient: [name]  Procedure(s) Performed: Procedure(s) (LRB): INSERTION OF GASTROSTOMY TUBE (N/A)  Patient location during evaluation: PACU Anesthesia Type: General Level of consciousness: awake, awake and alert and oriented Pain management: pain level controlled Vital Signs Assessment: post-procedure vital signs reviewed and stable Respiratory status: spontaneous breathing, nonlabored ventilation and respiratory function stable Cardiovascular status: blood pressure returned to baseline Anesthetic complications: no       Last Vitals:  Vitals:   11/13/16 0927 11/13/16 1550  BP: (!) 143/74 (!) 151/71  Pulse: 66 67  Resp: 16 (!) 30  Temp: 36.9 C 36.2 C    Last Pain:  Vitals:   11/13/16 1550  TempSrc:   PainSc: 6                  Chancey Ringel COKER

## 2016-11-13 NOTE — Transfer of Care (Signed)
Immediate Anesthesia Transfer of Care Note  Patient: [name]  Procedure(s) Performed: Procedure(s): INSERTION OF GASTROSTOMY TUBE (N/A)  Patient Location: PACU  Anesthesia Type:General  Level of Consciousness: awake, alert  and oriented  Airway & Oxygen Therapy: Patient Spontanous Breathing and Patient connected to nasal cannula oxygen  Post-op Assessment: Report given to RN, Post -op Vital signs reviewed and stable and Patient moving all extremities X 4  Post vital signs: Reviewed and stable  Last Vitals:  Vitals:   11/13/16 0927 11/13/16 1550  BP: (!) 143/74   Pulse: 66   Resp: 16   Temp: 36.9 C 36.2 C    Last Pain:  Vitals:   11/13/16 1550  TempSrc:   PainSc: 6       Patients Stated Pain Goal: 2 ([id number])  Complications: No apparent anesthesia complications

## 2016-11-13 NOTE — Progress Notes (Signed)
    Subjective: 5 Days Post-Op Procedure(s) (LRB): INCISION AND DRAINAGE OF NECK ABSCESS (N/A) Patient reports pain as 2 on 0-10 scale.   Denies CP or SOB.  Voiding without difficulty. Positive flatus. Objective: Vital signs in last 24 hours: Temp:  [97.5 F (36.4 C)-98.7 F (37.1 C)] 98.2 F (36.8 C) (03/01 0603) Pulse Rate:  [56-65] 65 (03/01 0603) Resp:  [18-20] 20 (03/01 0603) BP: (141-161)/(63-73) 141/71 (03/01 0603) SpO2:  [94 %-98 %] 98 % (03/01 0603) Weight:  [79 kg (174 lb 2.6 oz)] 79 kg (174 lb 2.6 oz) (03/01 0450)  Intake/Output from previous day: 02/28 0701 - 03/01 0700 In: 2085 [P.O.:120; I.V.:1365; IV Piggyback:600] Out: -  Intake/Output this shift: No intake/output data recorded.  Labs:  Recent Labs  11/11/16 0803  HGB 12.6    Recent Labs  11/11/16 0803  WBC 8.1  RBC 4.38  HCT 36.9  PLT 286    Recent Labs  11/12/16 0514 11/13/16 0419  NA 136 136  K 3.3* 4.1  CL 104 106  CO2 25 24  BUN 8 6  CREATININE 0.46 0.32*  GLUCOSE 139* 116*  CALCIUM 8.3* 8.4*   No results for input(s): LABPT, INR in the last 72 hours.  Physical Exam: Neurologically intact Intact pulses distally Incision: no drainage Compartment soft  Assessment/Plan: 5 Days Post-Op Procedure(s) (LRB): INCISION AND DRAINAGE OF NECK ABSCESS (N/A) ENT: will defer wound care management to Dr Erik Obey.   General surgery to place feeding tube today or tomorrow. Will need to set up [facility] services: wound care, iv abx, mangement of feeding tube and picc line Also need f/u with ID for abx management Can f/u with me in 3 months.  No active orth issues that require surgical intervention.  Osteo will be treated with antibiotics.  Attempted to apply brace but patient refused.   Melina Schools D for Dr. Melina Schools [facility] [phone] 11/13/2016, 8:10 AM

## 2016-11-13 NOTE — Progress Notes (Signed)
    [facility] for Infectious Disease   Reason for visit: Follow up on osteomyelitis  Interval History: no new positive cultures.  Has picc line.    Physical Exam: Constitutional:  Vitals:   11/13/16 0038 11/13/16 0603  BP: (!) 146/73 (!) 141/71  Pulse: (!) 56 65  Resp: 20 20  Temp: 97.5 F (36.4 C) 98.2 F (36.8 C)   patient appears in NAD  Impression: abscess with cervical thoracic osteo.    Plan: 1.  Vancomycin and meropenem for 6-8 weeks through April 20th Antibiotics per home health protocol Keep picc line in until seen by ID MD in clinic I will initiate parenteral antibiotic discharge order set We will arrange follow up in our clinic in 3-4 weeks

## 2016-11-14 ENCOUNTER — Inpatient Hospital Stay (HOSPITAL_COMMUNITY): Payer: Medicare Other | Admitting: Anesthesiology

## 2016-11-14 ENCOUNTER — Encounter (HOSPITAL_COMMUNITY)
Admission: AD | Disposition: A | Discharge status: Home Health | Payer: Self-pay | Source: Ambulatory Visit | Attending: Orthopedic Surgery

## 2016-11-14 ENCOUNTER — Encounter (HOSPITAL_COMMUNITY): Payer: Self-pay | Admitting: Surgery

## 2016-11-14 HISTORY — PX: REMOVAL OF GASTROSTOMY TUBE: SHX6058

## 2016-11-14 LAB — GLUCOSE, CAPILLARY
GLUCOSE-CAPILLARY: 109 mg/dL — AB (ref 65–99)
GLUCOSE-CAPILLARY: 125 mg/dL — AB (ref 65–99)
GLUCOSE-CAPILLARY: 125 mg/dL — AB (ref 65–99)
GLUCOSE-CAPILLARY: 125 mg/dL — AB (ref 65–99)
GLUCOSE-CAPILLARY: 136 mg/dL — AB (ref 65–99)
Glucose-Capillary: 115 mg/dL — ABNORMAL HIGH (ref 65–99)

## 2016-11-14 SURGERY — REMOVAL, GASTROSTOMY TUBE
Anesthesia: General | Site: Abdomen

## 2016-11-14 MED ORDER — HYDROMORPHONE HCL 1 MG/ML IJ SOLN
INTRAMUSCULAR | Status: AC
  Administered 2016-11-14: 0.5 mg via INTRAVENOUS
  Filled 2016-11-14: qty 1

## 2016-11-14 MED ORDER — 0.9 % SODIUM CHLORIDE (POUR BTL) OPTIME
TOPICAL | Status: DC | PRN
  Administered 2016-11-14: 1000 mL

## 2016-11-14 MED ORDER — BUPIVACAINE-EPINEPHRINE 0.25% -1:200000 IJ SOLN
INTRAMUSCULAR | Status: DC | PRN
  Administered 2016-11-14: 10 mL

## 2016-11-14 MED ORDER — BUPIVACAINE HCL (PF) 0.5 % IJ SOLN
INTRAMUSCULAR | Status: AC
  Filled 2016-11-14: qty 30

## 2016-11-14 MED ORDER — PROPOFOL 10 MG/ML IV BOLUS
INTRAVENOUS | Status: DC | PRN
  Administered 2016-11-14 (×2): 50 mg via INTRAVENOUS
  Administered 2016-11-14: 100 mg via INTRAVENOUS

## 2016-11-14 MED ORDER — JEVITY 1.2 CAL PO LIQD
1000.0000 mL | ORAL | Status: DC
  Administered 2016-11-14 – 2016-11-15 (×2): 1000 mL
  Filled 2016-11-14 (×4): qty 1000

## 2016-11-14 MED ORDER — LACTATED RINGERS IV SOLN
INTRAVENOUS | Status: DC | PRN
  Administered 2016-11-14: 15:00:00 via INTRAVENOUS

## 2016-11-14 MED ORDER — DEXTROSE 5 % IV SOLN
INTRAVENOUS | Status: DC | PRN
  Administered 2016-11-14: 14:00:00 via INTRAVENOUS

## 2016-11-14 MED ORDER — CIPROFLOXACIN IN D5W 400 MG/200ML IV SOLN
400.0000 mg | Freq: Once | INTRAVENOUS | Status: DC
  Filled 2016-11-14: qty 200

## 2016-11-14 MED ORDER — SUCCINYLCHOLINE CHLORIDE 20 MG/ML IJ SOLN
INTRAMUSCULAR | Status: DC | PRN
  Administered 2016-11-14: 100 mg via INTRAVENOUS

## 2016-11-14 MED ORDER — FENTANYL CITRATE (PF) 100 MCG/2ML IJ SOLN
INTRAMUSCULAR | Status: DC | PRN
  Administered 2016-11-14: 50 ug via INTRAVENOUS
  Administered 2016-11-14: 100 ug via INTRAVENOUS
  Administered 2016-11-14: 50 ug via INTRAVENOUS

## 2016-11-14 MED ORDER — PROPOFOL 10 MG/ML IV BOLUS
INTRAVENOUS | Status: AC
  Filled 2016-11-14: qty 20

## 2016-11-14 MED ORDER — FENTANYL CITRATE (PF) 100 MCG/2ML IJ SOLN
INTRAMUSCULAR | Status: AC
  Filled 2016-11-14: qty 2

## 2016-11-14 MED ORDER — SODIUM CHLORIDE 0.9% FLUSH
10.0000 mL | INTRAVENOUS | Status: DC | PRN
  Administered 2016-11-16: 10 mL
  Filled 2016-11-14: qty 10

## 2016-11-14 MED ORDER — HYDROMORPHONE HCL 1 MG/ML IJ SOLN
0.2500 mg | INTRAMUSCULAR | Status: DC | PRN
  Administered 2016-11-14 (×2): 0.5 mg via INTRAVENOUS

## 2016-11-14 MED ORDER — ONDANSETRON HCL 4 MG/2ML IJ SOLN
INTRAMUSCULAR | Status: DC | PRN
  Administered 2016-11-14: 4 mg via INTRAVENOUS

## 2016-11-14 MED ORDER — PRO-STAT SUGAR FREE PO LIQD
30.0000 mL | Freq: Two times a day (BID) | ORAL | Status: DC
  Administered 2016-11-14 – 2016-11-16 (×4): 30 mL
  Filled 2016-11-14 (×4): qty 30

## 2016-11-14 MED ORDER — BUPIVACAINE-EPINEPHRINE (PF) 0.5% -1:200000 IJ SOLN
INTRAMUSCULAR | Status: AC
  Filled 2016-11-14: qty 30

## 2016-11-14 SURGICAL SUPPLY — 31 items
BLADE CLIPPER SURG (BLADE) IMPLANT
CANISTER SUCT 3000ML PPV (MISCELLANEOUS) ×3 IMPLANT
CHLORAPREP W/TINT 26ML (MISCELLANEOUS) ×1 IMPLANT
DRAPE LAPAROSCOPIC ABDOMINAL (DRAPES) ×3 IMPLANT
DRSG COVADERM 4X6 (GAUZE/BANDAGES/DRESSINGS) IMPLANT
DRSG COVADERM 4X8 (GAUZE/BANDAGES/DRESSINGS) IMPLANT
ELECT CAUTERY BLADE 6.4 (BLADE) ×2 IMPLANT
ELECT REM PT RETURN 9FT ADLT (ELECTROSURGICAL) ×3
ELECTRODE REM PT RTRN 9FT ADLT (ELECTROSURGICAL) ×1 IMPLANT
GLOVE SURG SIGNA 7.5 PF LTX (GLOVE) ×3 IMPLANT
GOWN STRL REUS W/ TWL LRG LVL3 (GOWN DISPOSABLE) ×1 IMPLANT
GOWN STRL REUS W/ TWL XL LVL3 (GOWN DISPOSABLE) ×1 IMPLANT
GOWN STRL REUS W/TWL LRG LVL3 (GOWN DISPOSABLE) ×3
GOWN STRL REUS W/TWL XL LVL3 (GOWN DISPOSABLE) ×3
KIT BASIN OR (CUSTOM PROCEDURE TRAY) ×3 IMPLANT
KIT ROOM TURNOVER OR (KITS) ×3 IMPLANT
NDL HYPO 25GX1X1/2 BEV (NEEDLE) ×1 IMPLANT
NEEDLE HYPO 25GX1X1/2 BEV (NEEDLE) ×3 IMPLANT
NS IRRIG 1000ML POUR BTL (IV SOLUTION) ×6 IMPLANT
PACK GENERAL/GYN (CUSTOM PROCEDURE TRAY) ×3 IMPLANT
PAD ARMBOARD 7.5X6 YLW CONV (MISCELLANEOUS) ×3 IMPLANT
STAPLER VISISTAT 35W (STAPLE) ×3 IMPLANT
SUT NOVA NAB GS-21 0 18 T12 DT (SUTURE) ×2 IMPLANT
SUT PDS II 0 TP-1 LOOPED 60 (SUTURE) ×1 IMPLANT
SUT SILK 2 0 SH CR/8 (SUTURE) ×2 IMPLANT
SUT SILK 2 0 TIES 10X30 (SUTURE) IMPLANT
SYR CONTROL 10ML LL (SYRINGE) ×3 IMPLANT
SYRINGE TOOMEY DISP (SYRINGE) ×3 IMPLANT
TOWEL OR 17X24 6PK STRL BLUE (TOWEL DISPOSABLE) ×3 IMPLANT
TOWEL OR 17X26 10 PK STRL BLUE (TOWEL DISPOSABLE) ×1 IMPLANT
TUBE GASTROSTOMY 18F (CATHETERS) ×2 IMPLANT

## 2016-11-14 NOTE — Progress Notes (Signed)
1 Day Post-Op  Subjective: Complains of incisional pain  Objective: Vital signs in last 24 hours: Temp:  [97.2 F (36.2 C)-98.4 F (36.9 C)] 98.4 F (36.9 C) (03/02 0532) Pulse Rate:  [66-84] 81 (03/02 0532) Resp:  [18-30] 18 (03/02 0532) BP: (115-173)/(71-102) 166/84 (03/02 0532) SpO2:  [97 %-100 %] 100 % (03/02 0532) Last BM Date: 11/12/16  Intake/Output from previous day: 03/01 0701 - 03/02 0700 In: 4455 [I.V.:2925; IV Piggyback:1530] Out: -  Intake/Output this shift: No intake/output data recorded.  Exam: Abdomen soft, G-tube intact  Lab Results:  No results for input(s): WBC, HGB, HCT, PLT in the last 72 hours. BMET  Recent Labs  11/12/16 0514 11/13/16 0419  NA 136 136  K 3.3* 4.1  CL 104 106  CO2 25 24  GLUCOSE 139* 116*  BUN 8 6  CREATININE 0.46 0.32*  CALCIUM 8.3* 8.4*   PT/INR No results for input(s): LABPROT, INR in the last 72 hours. ABG No results for input(s): PHART, HCO3 in the last 72 hours.  Invalid input(s): PCO2, PO2  Studies/Results: No results found.  Anti-infectives: Anti-infectives    Start     Dose/Rate Route Frequency Ordered Stop   11/13/16 1300  vancomycin (VANCOCIN) 1,500 mg in sodium chloride 0.9 % 500 mL IVPB     1,500 mg 250 mL/hr over 120 Minutes Intravenous Every 12 hours 11/13/16 1212     11/11/16 1000  vancomycin (VANCOCIN) 1,500 mg in sodium chloride 0.9 % 500 mL IVPB  Status:  Discontinued     1,500 mg 250 mL/hr over 120 Minutes Intravenous Every 12 hours 11/11/16 0014 11/13/16 1212   11/07/16 0000  vancomycin (VANCOCIN) IVPB 1000 mg/200 mL premix  Status:  Discontinued     1,000 mg 200 mL/hr over 60 Minutes Intravenous Every 12 hours 11/06/16 2321 11/11/16 0014   11/07/16 0000  metroNIDAZOLE (FLAGYL) IVPB 500 mg  Status:  Discontinued     500 mg 100 mL/hr over 60 Minutes Intravenous Every 8 hours 11/06/16 2340 11/06/16 2347   11/07/16 0000  meropenem (MERREM) 1 g in sodium chloride 0.9 % 100 mL IVPB     1 g 200  mL/hr over 30 Minutes Intravenous Every 8 hours 11/06/16 2358     11/06/16 2330  cefTRIAXone (ROCEPHIN) 2 g in dextrose 5 % 50 mL IVPB  Status:  Discontinued     2 g 100 mL/hr over 30 Minutes Intravenous Daily at bedtime 11/06/16 2320 11/06/16 2348   11/06/16 2300  cefTRIAXone (ROCEPHIN) 1 g in dextrose 5 % 50 mL IVPB  Status:  Discontinued     1 g 100 mL/hr over 30 Minutes Intravenous Every 12 hours 11/06/16 2211 11/06/16 2320      Assessment/Plan: s/p Procedure(s): INSERTION OF GASTROSTOMY TUBE (N/A)  Ok to start using tube for nutrition and meds  LOS: 8 days    [name] A 11/14/2016

## 2016-11-14 NOTE — Progress Notes (Signed)
    Subjective: 1 Day Post-Op Procedure(s) (LRB): INSERTION OF GASTROSTOMY TUBE (N/A) Patient reports pain as 6 on 0-10 scale.   Denies CP or SOB.  Voiding without difficulty. Positive flatus. Objective: Vital signs in last 24 hours: Temp:  [97.2 F (36.2 C)-98.5 F (36.9 C)] 98.4 F (36.9 C) (03/02 0532) Pulse Rate:  [66-84] 81 (03/02 0532) Resp:  [16-30] 18 (03/02 0532) BP: (115-173)/(71-102) 166/84 (03/02 0532) SpO2:  [97 %-100 %] 100 % (03/02 0532)  Intake/Output from previous day: 03/01 0701 - 03/02 0700 In: 4455 [I.V.:2925; IV Piggyback:1530] Out: -  Intake/Output this shift: No intake/output data recorded.  Labs:  Recent Labs  11/11/16 0803  HGB 12.6    Recent Labs  11/11/16 0803  WBC 8.1  RBC 4.38  HCT 36.9  PLT 286    Recent Labs  11/12/16 0514 11/13/16 0419  NA 136 136  K 3.3* 4.1  CL 104 106  CO2 25 24  BUN 8 6  CREATININE 0.46 0.32*  GLUCOSE 139* 116*  CALCIUM 8.3* 8.4*   No results for input(s): LABPT, INR in the last 72 hours.  Physical Exam: Neurologically intact Sensation intact distally Intact pulses distally Incision: no drainage Compartment soft  Assessment/Plan: 1 Day Post-Op Procedure(s) (LRB): INSERTION OF GASTROSTOMY TUBE (N/A) Discharge home with home health  Patient with PICC line - functioning Feeding tube placed. Plan: d/c either today or in AM depending upon pain level. Will need specific d/c instructions from various services  1. ENT: wound care instructions for [facility] nurse. F/u for wound evaluation  2. ID: ABX regimen and follow up to monitor progress 3. General surgery: wound care instructions 4. Nutrition: feeding schedule.  Will d/c back on her typical pain regimen and contact pain management provider if changes are required.  Melina Schools D for Dr. Melina Schools [facility] [phone] 11/14/2016, 7:12 AM

## 2016-11-14 NOTE — Anesthesia Postprocedure Evaluation (Signed)
Anesthesia Post Note  Patient: [name]  Procedure(s) Performed: Procedure(s) (LRB): REMOVAL OF GASTROSTOMY TUBE W/ REPLACEMENT OF GASTROSTOMY TUBE (N/A)  Patient location during evaluation: PACU Anesthesia Type: General Level of consciousness: sedated and patient cooperative Pain management: pain level controlled Vital Signs Assessment: post-procedure vital signs reviewed and stable Respiratory status: spontaneous breathing Cardiovascular status: stable Anesthetic complications: no       Last Vitals:  Vitals:   11/14/16 1542 11/14/16 1556  BP: (!) 169/75 (!) 168/83  Pulse: 70 68  Resp: (!) 22 20  Temp:      Last Pain:  Vitals:   11/14/16 1626  TempSrc:   PainSc: R[place]

## 2016-11-14 NOTE — Op Note (Signed)
REMOVAL OF GASTROSTOMY TUBE W/ REPLACEMENT OF GASTROSTOMY TUBE  Procedure Note  [name] 11/06/2016 - 11/14/2016   Pre-op Diagnosis: Ruptured Balloon for G-tube     Post-op Diagnosis: ruptured G-tube  Procedure(s): REMOVAL OF GASTROSTOMY TUBE W/ REPLACEMENT OF GASTROSTOMY TUBE (18 fr)  Surgeon(s): Coralie Keens, MD  Anesthesia: General  Staff:  Circulator: [name], RN; Milas Kocher, RN Relief Circulator: Susann Givens, RN Relief Scrub: Albin Felling Tincher Scrub Person: Adella Hare Float Surgical Tech: Christeen Keeshia Sanderlin, CST  Estimated Blood Loss: Minimal               Indications: This patient had undergone an open G-tube insertion yesterday. When the tube was flushed today certainly around the skin. It was uncertain whether the balloon had ruptured and the tube dislodge the decision made proceed back to the operating room.  Findings: The G-tube was found to be in the appropriate location. There was a hole in the G-tube proximal to the balloon.  Procedure: The patient was brought to the operating identified as correct patient. She is placed upon the operative table general seizures induced. Her abdomen and previous G-tube were then prepped and draped in the usual sterile fashion. I cut the sutures around the G-tube and a completely removed it. I then tested the balloon on the G-tube was intact. We then flushed the tube and there was a hole in the G-tube proximal to the balloon. At this point, I tried to insert an 62 [place] gastrostomy tube back to the incision. It was uncertain of the go. I thus removed the skin staples from the midline incision. I then removed the fascial incisions. At into the stomach down from the abdominal wall. I then passed the 12 French gastrostomy tube through the abdominal wall. I first resutured back around the gastrotomy site. I then placed the new tube through the gastrotomy into the stomach. We are retested the tube in the  balloon. We reinsufflated the new gastrostomy tube balloon with 7 ml of saline. We then pulled the tube in stomach up against the abdominal wall. I then sutured it to the abdominal wall to interrupted silk sutures. I then flushed the tube and saw no evidence of leak. I closed the patient's midline fascia with several figure-of-eight 0 Novafil sutures. The skin again was closed in staples. Gastrostomy tube was sutured to the abdominal wall with silk sutures. Gauze and tape were then applied. Patient tolerated procedure well. All the counts were correct at the end procedure. The patient was then extubated in the operative room and taken in a stable condition to recovery.          [name] A   Date: 11/14/2016  Time: 3:19 PM

## 2016-11-14 NOTE — Anesthesia Preprocedure Evaluation (Addendum)
Anesthesia Evaluation  Patient identified by MRN, date of birth, ID band Patient awake    Reviewed: Allergy & Precautions, H&P , NPO status , Patient's Chart, lab work & pertinent test results  Airway Mallampati: II  TM Distance: >3 FB Neck ROM: Full    Dental no notable dental hx. (+) Edentulous Upper, Edentulous Lower, Dental Advisory Given   Pulmonary asthma , COPD,  COPD inhaler, Current Smoker,    Pulmonary exam normal breath sounds clear to auscultation       Cardiovascular hypertension, Pt. on medications + CAD and + Past MI   Rhythm:Regular Rate:Normal     Neuro/Psych negative neurological ROS  negative psych ROS   GI/Hepatic negative GI ROS, Neg liver ROS,   Endo/Other  diabetes, Insulin Dependent  Renal/GU negative Renal ROS  negative genitourinary   Musculoskeletal  (+) Arthritis , Osteoarthritis,    Abdominal   Peds  Hematology negative hematology ROS (+)   Anesthesia Other Findings   Reproductive/Obstetrics negative OB ROS                            Anesthesia Physical Anesthesia Plan  ASA: III  Anesthesia Plan: General   Post-op Pain Management:    Induction: Intravenous  Airway Management Planned: Oral ETT  Additional Equipment:   Intra-op Plan:   Post-operative Plan: Extubation in OR  Informed Consent: I have reviewed the patients History and Physical, chart, labs and discussed the procedure including the risks, benefits and alternatives for the proposed anesthesia with the patient or authorized representative who has indicated his/her understanding and acceptance.   Dental advisory given  Plan Discussed with: CRNA  Anesthesia Plan Comments:         Anesthesia Quick Evaluation

## 2016-11-14 NOTE — Progress Notes (Signed)
Nutrition Follow-up   INTERVENTION:  Initiate Jevity 1.2 @ 20 ml/hr via PEG and increase by 10 ml/hr every 2 hours to goal rate of 50 ml/hr continuously. Provide 30 ml Pro-Stat via tube BID. TF regimen will provide 1640 kcal, 97 grams of protein, and 972 ml of water.  Recommend 130 ml free water flushes 6 time daily.    Pt request 2cal/ml formula for home, but this is not available in hospital. If patient is to be on TwoCalHN at home, provide this at 35 ml/hr continuous with 30 ml Pro-Stat BID. This will provide 1640 kcal, 90 grams of protein and 504 ml of water. Pt would need 200 ml free water flushes 6 times daily.    NUTRITION DIAGNOSIS:   Inadequate oral intake related to inability to eat as evidenced by NPO status.  ongoing  GOAL:   Patient will meet greater than or equal to 90% of their needs  Being met  MONITOR:   TF tolerance, Weight trends, Skin, I & O's, Labs, Diet advancement  REASON FOR ASSESSMENT:   Consult Enteral/tube feeding initiation and management  ASSESSMENT:   71 y.o. female presented with a history of bilateral shoulder and arm pain tha has been ongoing for about two weeks now.  Onset of symptoms was gradual starting 2 weeks ago with gradually worsening course since that time.   Recommended to have a CT scan which was performed earlier today. The scan showed revertebral thickening and gas bubbles from C3 to T2 consistent  Pt had PEG placed yesterday. MD note at 1118 hr stated okay to start using tube for nutrition and meds. Instructed RN to initiate Jevity 1.2 @ 20 ml/hr and increase by 10 ml/hr every 2 hours to goal rate of 50 ml/hr.  Now MD note stats that G-tube balloon has broken and she needs to return urgently to OR to replace tube.   Pt states that she wants to go home on continuous feeds rather than bolus. She states that she wants to be on 2cal formula like she was on last time she had a PEG. Spoke with RN from [facility] and she reports  that pt has been on both TwoCalHN Vanilla and Jevity in the past.   Labs: low calcium  Diet Order:  Diet NPO time specified Except for: Ice Chips, Sips with Meds  Skin:  Reviewed, no issues (closed incision on neck; closed incision on abdomen)  Last BM:  2/28  Height:   Ht Readings from Last 1 Encounters:  11/06/16 5' 2" (1.575 m)    Weight:   Wt Readings from Last 1 Encounters:  11/13/16 174 lb 2.6 oz (79 kg)  Weight inaccurate. Weight on 2/27 and 2/28 was 136 lb.   Ideal Body Weight:  50 kg  BMI:  Body mass index is 31.85 kg/m.  Estimated Nutritional Needs:   Kcal:  1550-1800  Protein:  92-106 grams  Fluid:  1.8 L/day  EDUCATION NEEDS:   No education needs identified at this time    RD, LDN, CSP Inpatient Clinical Dietitian Pager: [phone] After Hours Pager: [phone]  

## 2016-11-14 NOTE — Progress Notes (Signed)
Patient ID: [name], female   DOB: 1946-07-09, 71 y.o.   MRN: [id number]   Unfortunately, the G-tube balloon has broken and the tube is leaking. She needs to return urgently to the OR the replace the tube. I explained this to her in detail and she agrees to proceed.

## 2016-11-14 NOTE — Care Management Important Message (Signed)
Important Message  Patient Details  Name: [name] MRN: [id number] Date of Birth: 07-16-1946   Medicare Important Message Given:  Yes    Orbie Pyo 11/14/2016, 3:27 PM

## 2016-11-14 NOTE — Transfer of Care (Signed)
Immediate Anesthesia Transfer of Care Note  Patient: ALEXYA BELDIN  Procedure(s) Performed: Procedure(s): REMOVAL OF GASTROSTOMY TUBE W/ REPLACEMENT OF GASTROSTOMY TUBE (N/A)  Patient Location: PACU  Anesthesia Type:General  Level of Consciousness: awake, oriented, sedated, patient cooperative and responds to stimulation  Airway & Oxygen Therapy: Patient Spontanous Breathing and Patient connected to nasal cannula oxygen  Post-op Assessment: Report given to RN, Post -op Vital signs reviewed and stable, Patient moving all extremities and Patient moving all extremities X 4  Post vital signs: Reviewed and stable  Last Vitals:  Vitals:   11/14/16 0051 11/14/16 0532  BP: (!) 168/88 (!) 166/84  Pulse: 84 81  Resp: 18 18  Temp: 36.4 C 36.9 C    Last Pain:  Vitals:   11/14/16 0933  TempSrc:   PainSc: 8       Patients Stated Pain Goal: 2 ([phone] [id number])  Complications: No apparent anesthesia complications

## 2016-11-14 NOTE — Anesthesia Procedure Notes (Signed)
Procedure Name: Intubation Date/Time: 11/14/2016 2:50 PM Performed by: Jacquiline Doe A Pre-anesthesia Checklist: Patient identified, Emergency Drugs available, Suction available and Patient being monitored Patient Re-evaluated:Patient Re-evaluated prior to inductionOxygen Delivery Method: Circle System Utilized and Circle system utilized Preoxygenation: Pre-oxygenation with 100% oxygen Intubation Type: IV induction and Cricoid Pressure applied Ventilation: Mask ventilation without difficulty Laryngoscope Size: Mac and 3 Grade View: Grade I Tube type: Oral Tube size: 7.5 mm Number of attempts: 1 Airway Equipment and Method: Stylet Placement Confirmation: ETT inserted through vocal cords under direct vision,  positive ETCO2 and breath sounds checked- equal and bilateral Secured at: 21 cm Tube secured with: Tape Dental Injury: Teeth and Oropharynx as per pre-operative assessment

## 2016-11-14 NOTE — Care Management Note (Signed)
Case Management Note  Patient Details  Name: [name] MRN: [id number] Date of Birth: 04/14/1946  Subjective/Objective:                    Action/Plan: Plan is for d/c home with [facility] services when the patient is medically ready. CM met with [name] and provided her a list of [facility] agencies. She selected [facility] for DME and [facility] services. Jermaine with [facility] informed of the potential DME needs and [facility] referral which he accepted. CM continuing to follow for tube feedings and IV antibiotic orders.   Expected Discharge Date:                  Expected Discharge Plan:  [place]  In-House Referral:     Discharge planning Services  CM Consult  Post Acute Care Choice:  Durable Medical Equipment, Home Health Choice offered to:  Patient  DME Arranged:    DME Agency:  [facility] Arranged:  PT, RN, Nurse's Aide [place] Agency:  [place]  Status of Service:  In process, will continue to follow  If discussed at Long Length of Stay Meetings, dates discussed:    Additional Comments:  Pollie Friar, RN 11/14/2016, 12:09 PM

## 2016-11-15 ENCOUNTER — Encounter (HOSPITAL_COMMUNITY): Payer: Self-pay | Admitting: Surgery

## 2016-11-15 LAB — GLUCOSE, CAPILLARY
GLUCOSE-CAPILLARY: 166 mg/dL — AB (ref 65–99)
GLUCOSE-CAPILLARY: 130 mg/dL — AB (ref 65–99)
GLUCOSE-CAPILLARY: 99 mg/dL (ref 65–99)
Glucose-Capillary: 136 mg/dL — ABNORMAL HIGH (ref 65–99)
Glucose-Capillary: 146 mg/dL — ABNORMAL HIGH (ref 65–99)
Glucose-Capillary: 160 mg/dL — ABNORMAL HIGH (ref 65–99)

## 2016-11-15 MED ORDER — LOPERAMIDE HCL 1 MG/5ML PO LIQD
2.0000 mg | Freq: Four times a day (QID) | ORAL | Status: DC | PRN
  Administered 2016-11-15 – 2016-11-16 (×2): 2 mg via ORAL
  Filled 2016-11-15 (×3): qty 10

## 2016-11-15 NOTE — Progress Notes (Signed)
Subjective: 1 Day Post-Op Procedure(s) (LRB): REMOVAL OF GASTROSTOMY TUBE W/ REPLACEMENT OF GASTROSTOMY TUBE (N/A) Patient reports pain as mild.   1 week s/p I&D neck by Dr. Rolena Infante Pain well controlled. Seen by myself and Dr. Gladstone Lighter  Objective: Vital signs in last 24 hours: Temp:  [97.5 F (36.4 C)-98.4 F (36.9 C)] 98.2 F (36.8 C) (03/03 0452) Pulse Rate:  [68-91] 84 (03/03 0452) Resp:  [18-22] 18 (03/03 0452) BP: (130-169)/(69-88) 130/71 (03/03 0452) SpO2:  [90 %-100 %] 90 % (03/03 0452)  Intake/Output from previous day: 03/02 0701 - 03/03 0700 In: 900 [I.V.:900] Out: 10 [Blood:10] Intake/Output this shift: No intake/output data recorded.  No results for input(s): HGB in the last 72 hours. No results for input(s): WBC, RBC, HCT, PLT in the last 72 hours.  Recent Labs  11/13/16 0419  NA 136  K 4.1  CL 106  CO2 24  BUN 6  CREATININE 0.32*  GLUCOSE 116*  CALCIUM 8.4*   No results for input(s): LABPT, INR in the last 72 hours.  Neurologically intact ABD soft Neurovascular intact Sensation intact distally Intact pulses distally Dorsiflexion/Plantar flexion intact Incision: dressing C/D/I and no drainage No cellulitis present Compartment soft no sign of DVT  Assessment/Plan: 1 Day Post-Op Procedure(s) (LRB): REMOVAL OF GASTROSTOMY TUBE W/ REPLACEMENT OF GASTROSTOMY TUBE (N/A) Advance diet Up with therapy  Ultimately D/C will depend on her disposition per other services. She is stable from an ortho standpoint.  Will need specific d/c instructions from various services  1. ENT: wound care instructions for [facility] nurse. F/u for wound evaluation  2. ID: ABX regimen and follow up to monitor progress 3. General surgery: wound care instructions 4. Nutrition: feeding schedule.Will need specific d/c instructions from various services    Shaka Zech M. 11/15/2016, 8:32 AM

## 2016-11-15 NOTE — Progress Notes (Signed)
General Surgery [facility]  Assessment & Plan:  POD#1 - replacement of gastrostomy tube  TF's resumed and running at 50cc per hour - no leakage  Patient up and ambulatory, some pain  Home when stable from orthopedic standpoint        Velora Heckler, MD, [facility]       Office: [phone]    Subjective: Patient up in room, ambulatory.  Complains of some abd pain post op.  Objective: Vital signs in last 24 hours: Temp:  [97.5 F (36.4 C)-98.4 F (36.9 C)] 98.2 F (36.8 C) (03/03 0452) Pulse Rate:  [68-91] 84 (03/03 0452) Resp:  [18-22] 18 (03/03 0452) BP: (130-169)/(69-88) 130/71 (03/03 0452) SpO2:  [90 %-100 %] 90 % (03/03 0452) Last BM Date: 11/12/16  Intake/Output from previous day: 03/02 0701 - 03/03 0700 In: 900 [I.V.:900] Out: 10 [Blood:10] Intake/Output this shift: No intake/output data recorded.  Physical Exam: HEENT - sclerae clear, mucous membranes moist Neck - collar in place Abdomen - gastrostomy LUQ with site clear and dry, sutures in place Ext - no edema, non-tender Neuro - alert & oriented, no focal deficits  Lab Results:  No results for input(s): WBC, HGB, HCT, PLT in the last 72 hours. BMET  Recent Labs  11/13/16 0419  NA 136  K 4.1  CL 106  CO2 24  GLUCOSE 116*  BUN 6  CREATININE 0.32*  CALCIUM 8.4*   PT/INR No results for input(s): LABPROT, INR in the last 72 hours. Comprehensive Metabolic Panel:    Component Value Date/Time   NA 136 11/13/2016 0419   NA 136 11/12/2016 0514   K 4.1 11/13/2016 0419   K 3.3 (L) 11/12/2016 0514   CL 106 11/13/2016 0419   CL 104 11/12/2016 0514   CO2 24 11/13/2016 0419   CO2 25 11/12/2016 0514   BUN 6 11/13/2016 0419   BUN 8 11/12/2016 0514   CREATININE 0.32 (L) 11/13/2016 0419   CREATININE 0.46 11/12/2016 0514   CREATININE 0.47 (L) 08/22/2015 1034   GLUCOSE 116 (H) 11/13/2016 0419   GLUCOSE 139 (H) 11/12/2016 0514   CALCIUM 8.4 (L)  11/13/2016 0419   CALCIUM 8.3 (L) 11/12/2016 0514   AST 17 11/08/2016 0241   AST 15 11/06/2016 2337   ALT 10 (L) 11/08/2016 0241   ALT 10 (L) 11/06/2016 2337   ALKPHOS 128 (H) 11/08/2016 0241   ALKPHOS 128 (H) 11/06/2016 2337   BILITOT 0.8 11/08/2016 0241   BILITOT 0.5 11/06/2016 2337   PROT 7.3 11/08/2016 0241   PROT 7.1 11/06/2016 2337   ALBUMIN 3.0 (L) 11/08/2016 0241   ALBUMIN 3.3 (L) 11/06/2016 2337    Studies/Results: No results found.    Demetress Tift M 11/15/2016  Patient ID: Ewing Schlein, female   DOB: 06/06/1946, 71 y.o.   MRN: [id number]

## 2016-11-16 LAB — GLUCOSE, CAPILLARY
GLUCOSE-CAPILLARY: 157 mg/dL — AB (ref 65–99)
Glucose-Capillary: 139 mg/dL — ABNORMAL HIGH (ref 65–99)
Glucose-Capillary: 127 mg/dL — ABNORMAL HIGH (ref 65–99)

## 2016-11-16 MED ORDER — SODIUM CHLORIDE 0.9 % IV SOLN
1.0000 g | Freq: Three times a day (TID) | INTRAVENOUS | Status: DC
  Administered 2016-11-16: 1 g via INTRAVENOUS
  Filled 2016-11-16 (×3): qty 1

## 2016-11-16 MED ORDER — VANCOMYCIN HCL 500 MG IV SOLR
500.0000 mg | Freq: Once | INTRAVENOUS | Status: AC
  Administered 2016-11-16: 500 mg via INTRAVENOUS
  Filled 2016-11-16: qty 500

## 2016-11-16 MED ORDER — CHLORHEXIDINE GLUCONATE 0.12 % MT SOLN: 5.0000 mL | Freq: Four times a day (QID) | OROMUCOSAL | 0 refills | Status: DC

## 2016-11-16 MED ORDER — OXYCODONE HCL 10 MG PO TABS: 10.0000 mg | ORAL_TABLET | ORAL | 0 refills | Status: DC | PRN

## 2016-11-16 MED ORDER — VANCOMYCIN IV (FOR PTA / DISCHARGE USE ONLY): 1500.0000 mg | Freq: Two times a day (BID) | INTRAVENOUS | 0 refills | Status: AC

## 2016-11-16 MED ORDER — ASPIRIN EC 81 MG PO TBEC: 81.0000 mg | DELAYED_RELEASE_TABLET | Freq: Every day | ORAL | 2 refills | Status: DC

## 2016-11-16 MED ORDER — HEPARIN SOD (PORK) LOCK FLUSH 100 UNIT/ML IV SOLN
250.0000 [IU] | INTRAVENOUS | Status: AC | PRN
  Administered 2016-11-16: 250 [IU]

## 2016-11-16 NOTE — Discharge Instructions (Signed)
Feeding tube: patient is to be on TwoCalHN at home, provide this at 35 ml/hr continuous with 30 ml Pro-Mod BID. This will provide 1640 kcal, 90 grams of protein and 504 ml of water. Pt would need 200 ml free water flushes 6 times daily.  Dry dressing changes for feeding tube and neck once a day  Ambulate as much as possible

## 2016-11-16 NOTE — Discharge Summary (Signed)
Patient ID: [name] MRN: [id number] DOB/AGE: 69-Jul-1947 69 y.o.  Admit date: 11/06/2016 Discharge date: 11/16/2016  Admission Diagnoses:  Principal Problem:   Abscess of neck Active Problems:   Diabetes mellitus type 2, insulin dependent (HCC)   Discharge Diagnoses:  Principal Problem:   Abscess of neck Active Problems:   Diabetes mellitus type 2, insulin dependent (HCC)  status post Procedure(s): REMOVAL OF GASTROSTOMY TUBE W/ REPLACEMENT OF GASTROSTOMY TUBE  Past Medical History:  Diagnosis Date  . Asthma   . Breast cancer (HCC)    right breast  . COPD (chronic obstructive pulmonary disease) (HCC)   . Coronary artery disease   . Elevated LFTs   . Emphysema lung (HCC)   . ETOH abuse   . H/O atrial fibrillation without current medication    only one time when she had sepsis  . Hyperlipidemia   . Hypertension    hx of but not on any medications  . Myocardial infarction 2000  . OA (osteoarthritis) of knee   . Osteoarthritis   . Tobacco abuse     Surgeries: Procedure(s): REMOVAL OF GASTROSTOMY TUBE W/ REPLACEMENT OF GASTROSTOMY TUBE on 11/06/2016 - 11/14/2016   Consultants: Dr Lazarus Salines: ENT   Dr. Rayburn Ma: General surgery   Dr Drue Second: ID    Discharged Condition: Improved  Hospital Course: [name] is an 71 y.o. female who was admitted 11/06/2016 for operative treatment of Abscess of neck. Patient failed conservative treatments (please see the history and physical for the specifics) and had severe unremitting pain that affects sleep, daily activities and work/hobbies.  Patient admitted and started on broad spectrum IV antibiotics given concerning features of CT scan.  ENT evaluation requested.  Discussed care and initially planned on conservative management. Repeat CT scan done on 2/28 demonstrated progression of infection and so patient taken to the OR by Dr Annalee Genta and myself for formal I&D and drain placement. Post operatively patient improved.   Repeat gastro-grafin swallow demonstrated an esophageal leak.  As a result general surgery consulted for feeding tube placement.  Patient had tube placed on 3/1.  The next day the tube mal-functioned and she was taken back to the OR for replacement.  Subsequently the feeding tube has functioned without issue. Patient has had PICC line placed for 6 week IV abx treatment. At this point there are no neurologically deficits or concerns about spinal stability. Will treat osteo with IV abx. Will eventually need to have esophageal leak addressed.   Patient was given perioperative antibiotics:  Anti-infectives    Start     Dose/Rate Route Frequency Ordered Stop   11/16/16 0000  vancomycin IVPB     1,500 mg Intravenous Every 12 hours 11/16/16 0841 01/05/17 2359   11/14/16 1500  ciprofloxacin (CIPRO) IVPB 400 mg  Status:  Discontinued    Comments:  Start in OR preop area   400 mg 200 mL/hr over 60 Minutes Intravenous  Once 11/14/16 1330 11/15/16 1214   11/13/16 1300  vancomycin (VANCOCIN) 1,500 mg in sodium chloride 0.9 % 500 mL IVPB     1,500 mg 250 mL/hr over 120 Minutes Intravenous Every 12 hours 11/13/16 1212     11/11/16 1000  vancomycin (VANCOCIN) 1,500 mg in sodium chloride 0.9 % 500 mL IVPB  Status:  Discontinued     1,500 mg 250 mL/hr over 120 Minutes Intravenous Every 12 hours 11/11/16 0014 11/13/16 1212   11/07/16 0000  vancomycin (VANCOCIN) IVPB 1000 mg/200 mL premix  Status:  Discontinued     1,000 mg 200 mL/hr over 60 Minutes Intravenous Every 12 hours 11/06/16 2321 11/11/16 0014   11/07/16 0000  metroNIDAZOLE (FLAGYL) IVPB 500 mg  Status:  Discontinued     500 mg 100 mL/hr over 60 Minutes Intravenous Every 8 hours 11/06/16 2340 11/06/16 2347   11/07/16 0000  meropenem (MERREM) 1 g in sodium chloride 0.9 % 100 mL IVPB     1 g 200 mL/hr over 30 Minutes Intravenous Every 8 hours 11/06/16 2358     11/06/16 2330  cefTRIAXone (ROCEPHIN) 2 g in dextrose 5 % 50 mL IVPB  Status:   Discontinued     2 g 100 mL/hr over 30 Minutes Intravenous Daily at bedtime 11/06/16 2320 11/06/16 2348   11/06/16 2300  cefTRIAXone (ROCEPHIN) 1 g in dextrose 5 % 50 mL IVPB  Status:  Discontinued     1 g 100 mL/hr over 30 Minutes Intravenous Every 12 hours 11/06/16 2211 11/06/16 2320       Patient was given sequential compression devices and early ambulation to prevent DVT.   Patient benefited maximally from hospital stay and there were no complications. At the time of discharge, the patient was urinating/moving their bowels without difficulty, tolerating a regular diet, pain is controlled with oral pain medications and they have been cleared by PT/OT.   Recent vital signs:  Patient Vitals for the past 24 hrs:  BP Temp Temp src Pulse Resp SpO2  11/16/16 0540 129/61 98.3 F (36.8 C) Oral 66 18 95 %  11/16/16 0141 134/67 98 F (36.7 C) Oral 66 18 94 %  11/15/16 2128 (!) 129/57 97.7 F (36.5 C) Oral 70 18 93 %  11/15/16 1927 - - - - - 100 %  11/15/16 1922 (!) 141/80 98.4 F (36.9 C) Oral 81 20 100 %  11/15/16 1459 128/70 98.9 F (37.2 C) Oral 80 20 100 %  11/15/16 1124 125/66 98 F (36.7 C) Oral 76 20 100 %  11/15/16 0909 - - - - - 92 %     Recent laboratory studies: No results for input(s): WBC, HGB, HCT, PLT, NA, K, CL, CO2, BUN, CREATININE, GLUCOSE, INR, CALCIUM in the last 72 hours.  Invalid input(s): PT, 2   Discharge Medications:   Allergies as of 11/16/2016      Reactions   Penicillins Rash   Has patient had a PCN reaction causing immediate rash, facial/tongue/throat swelling, SOB or lightheadedness with hypotension: unsure Has patient had a PCN reaction causing severe rash involving mucus membranes or skin necrosis: unsure Has patient had a PCN reaction that required hospitalization No Has patient had a PCN reaction occurring within the last 10 years: Yes If all of the above answers are "NO", then may proceed with Cephalosporin use.   Zithromax [azithromycin  Dihydrate] Other (See Comments)   ORAL ULCERS      Medication List    STOP taking these medications   methocarbamol 500 MG tablet Commonly known as:  ROBAXIN   morphine 30 MG tablet Commonly known as:  MSIR     TAKE these medications   albuterol 108 (90 Base) MCG/ACT inhaler Commonly known as:  PROVENTIL HFA;VENTOLIN HFA Inhale 2 puffs into the lungs every 6 (six) hours as needed for wheezing.   ALPRAZolam 0.25 MG tablet Commonly known as:  XANAX Take one tablet daily or as needed What changed:  how much to take  how to take this  when to take this  reasons to  Patient ID: [name] MRN: [id number] DOB/AGE: 23-Sep-1945 71 y.o.  Admit date: 11/06/2016 Discharge date: 11/16/2016  Admission Diagnoses:  Principal Problem:   Abscess of neck Active Problems:   Diabetes mellitus type 2, insulin dependent ([place])   Discharge Diagnoses:  Principal Problem:   Abscess of neck Active Problems:   Diabetes mellitus type 2, insulin dependent ([place])  status post Procedure(s): REMOVAL OF GASTROSTOMY TUBE W/ REPLACEMENT OF GASTROSTOMY TUBE  Past Medical History:  Diagnosis Date  . Asthma   . Breast cancer ([place])    right breast  . COPD (chronic obstructive pulmonary disease) ([place])   . Coronary artery disease   . Elevated LFTs   . Emphysema lung ([place])   . ETOH abuse   . H/O atrial fibrillation without current medication    only one time when she had sepsis  . Hyperlipidemia   . Hypertension    hx of but not on any medications  . Myocardial infarction 2000  . OA (osteoarthritis) of knee   . Osteoarthritis   . Tobacco abuse     Surgeries: Procedure(s): REMOVAL OF GASTROSTOMY TUBE W/ REPLACEMENT OF GASTROSTOMY TUBE on 11/06/2016 - 11/14/2016   Consultants: Dr Erik Obey: ENT   Dr. Rush Farmer: General surgery   Dr Baxter Flattery: ID    Discharged Condition: Improved  Hospital Course: [name] is an 71 y.o. female who was admitted 11/06/2016 for operative treatment of Abscess of neck. Patient failed conservative treatments (please see the history and physical for the specifics) and had severe unremitting pain that affects sleep, daily activities and work/hobbies.  Patient admitted and started on broad spectrum IV antibiotics given concerning features of CT scan.  ENT evaluation requested.  Discussed care and initially planned on conservative management. Repeat CT scan done on 2/28 demonstrated progression of infection and so patient taken to the OR by Dr Wilburn Cornelia and myself for formal I&D and drain placement. Post operatively patient improved.   Repeat gastro-grafin swallow demonstrated an esophageal leak.  As a result general surgery consulted for feeding tube placement.  Patient had tube placed on 3/1.  The next day the tube mal-functioned and she was taken back to the OR for replacement.  Subsequently the feeding tube has functioned without issue. Patient has had PICC line placed for 6 week IV abx treatment. At this point there are no neurologically deficits or concerns about spinal stability. Will treat osteo with IV abx. Will eventually need to have esophageal leak addressed.   Patient was given perioperative antibiotics:  Anti-infectives    Start     Dose/Rate Route Frequency Ordered Stop   11/16/16 0000  vancomycin IVPB     1,500 mg Intravenous Every 12 hours 11/16/16 0841 01/05/17 2359   11/14/16 1500  ciprofloxacin (CIPRO) IVPB 400 mg  Status:  Discontinued    Comments:  Start in OR preop area   400 mg 200 mL/hr over 60 Minutes Intravenous  Once 11/14/16 1330 11/15/16 1214   11/13/16 1300  vancomycin (VANCOCIN) 1,500 mg in sodium chloride 0.9 % 500 mL IVPB     1,500 mg 250 mL/hr over 120 Minutes Intravenous Every 12 hours 11/13/16 1212     11/11/16 1000  vancomycin (VANCOCIN) 1,500 mg in sodium chloride 0.9 % 500 mL IVPB  Status:  Discontinued     1,500 mg 250 mL/hr over 120 Minutes Intravenous Every 12 hours 11/11/16 0014 11/13/16 1212   11/07/16 0000  vancomycin (VANCOCIN) IVPB 1000 mg/200 mL premix  Status:  ml Pro-Mod BID. This will provide 1640 kcal, 90 grams of protein and 504 ml of water. Pt would need 200 ml free water flushes 6 times daily.   11/14/16 1348       Diagnostic Studies: Ct Soft Tissue Neck W Contrast  Result Date: 11/08/2016 CLINICAL DATA:  Dysphagia.  Prevertebral infection.  Osteomyelitis. EXAM: CT NECK WITH CONTRAST TECHNIQUE: Multidetector CT imaging of the neck was performed using the standard protocol following the bolus administration of intravenous contrast. CONTRAST:  75mL ISOVUE-300 IOPAMIDOL (ISOVUE-300) INJECTION 61% COMPARISON:  CT of the cervical spine 11/06/2016 at [facility]. MRI of the cervical spine without and with contrast 11/04/2016 at [facility]. FINDINGS: Pharynx and larynx: A diffuse retropharyngeal and prevertebral gas and soft tissue collection extends from C2 to at least sixth T2-3. The collection extends into the superior mediastinum. The the collection is slightly larger in AP dimension throughout its course. Vocal cords are midline. No other focal pharyngeal lesions are present. Salivary glands: The parotid and submandibular glands are within normal limits bilaterally. Thyroid: Thyroid is unremarkable. Lymph nodes: Multiple subcentimeter level 2, level 3, and level 4 lymph nodes are present bilaterally. These are more prominent left than right. Vascular: Atherosclerotic calcifications are present at the carotid bifurcations bilaterally without significant stenosis. Additional calcifications are present at the aortic arch and origins the great vessels. Limited intracranial: Within normal limits. Visualized orbits: Not imaged. Mastoids and visualized paranasal sinuses: The paranasal sinuses and mastoid air cells are clear. Skeleton: C3-4 and C4-5 fusion is again noted. Incomplete fusion is evident at C5-6 with prominent osteophytes contributing to foraminal stenosis. There is loss of bone mineralization compatible with osteomyelitis at C7, T1, and T2. Physis is evident within these vertebral bodies. Upper chest: A 7 mm cavitary nodule is again seen within the right upper lobe. Multiple peripheral bulla are  present. The prevertebral soft tissue collection extends into the superior mediastinum. IMPRESSION: 1. Interval increase in size of extensive prevertebral and retropharyngeal gas and fluid collection extending from C2 through T2. This is highly concerning for infection with extension into the superior mediastinum putting the patient at risk for mediastinitis. 2. Decreased mineralization at C7, T1, and T2 compatible with known osteomyelitis. Lytic areas are evident. 3. Sclerosis and fusion at C3-4 and C4-5. 4. Nonunion at C5-6. 5. 7 mm cavitary nodule in the right upper lobe. Non-contrast chest CT at 6-12 months is recommended. If the nodule is stable at time of repeat CT, then future CT at 18-24 months (from today's scan) is considered optional for low-risk patients, but is recommended for high-risk patients. This recommendation follows the consensus statement: Guidelines for Management of Incidental Pulmonary Nodules Detected on CT Images: From the Fleischner Society 2017; Radiology 2017; 284:228-243. These results were called by telephone at the time of interpretation on 11/08/2016 at 10:41 am to Dr. Duwayne Heck , who verbally acknowledged these results. Electronically Signed   By: Marin Roberts M.D.   On: 11/08/2016 10:41   Ct Cervical Spine Wo Contrast  Result Date: 11/06/2016 CLINICAL DATA:  Neck pain. Bilateral arm pain and tingling. Difficulty swallowing for 2 weeks EXAM: CT CERVICAL SPINE WITHOUT CONTRAST TECHNIQUE: Multidetector CT imaging of the cervical spine was performed without intravenous contrast. Multiplanar CT image reconstructions were also generated. COMPARISON:  Cervical spine MRI 06/28/2015 FINDINGS: Alignment: Exaggerated lordosis. Skull base and vertebrae: There is extensive prevertebral thickening extending from C2 to T2. Ventral bony erosions seen at C7, T1, and T2. At C3-4, gas extends into  a chronic screw defect. Scattered bubbles of gas throughout the areas of prevertebral  thickening. The supraglottic larynx is narrowed by retropharyngeal swelling, but no critical airway stenosis. Negative for acute fracture. Distorted appearance of the C3, C4, C5, and C6 vertebral bodies related to previous infection and hardware that has been removed. Soft tissues and spinal canal: Prevertebral findings described above. Disc levels: C2-3: Unremarkable. C3-4: Ankylosis. Discitis was seen here previously. There was previously hardware which was removed at the time of prior infection. Bony fusion is solid. Patent foramina. Spinal stenosis from residual ridging is advanced. C4-5: Solid bony fusion. Residual biforaminal stenosis and canal stenosis from residual spurring. C5-6: Corpectomy changes at C5. No solid bony fusion at C5-6. Bilateral uncovertebral spurs with C6 impingement. At least moderate spinal stenosis. C6-7: Mild degenerative changes at the disc. Facet arthropathy with asymmetric bulky right spurring. Advanced right foraminal stenosis. C7-T1:Facet arthropathy with asymmetric advanced right facet spurring. Prominent right foraminal stenosis. Upper chest: Inflammatory changes extend into the upper chest. Ovoid 6 mm nodule in the right upper lobe, not seen in 2016. Other: These results were called by telephone at the time of interpretation on 11/06/2016 at 4:15 pm to Dr. Venita Lick , who verbally acknowledged these results. Reportedly there was recent outside cervical MRI. IMPRESSION: 1. Prevertebral thickening and gas bubbles from C3 to T2 consistent with infectious collection. Patient had similar findings in 2016 when there was esophageal perforation, question recurrent esophageal perforation or chronic fistula. 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 Swelling causes moderate supraglottic laryngeal narrowing. 2. C3-4 ankylosis. C4-C6 anterior fusion. Pseudoarthrosis present at C5-6. 3. Spinal stenosis with cord impingement at C3-4 and C4-5, chronic. Foraminal narrowings  described above. Electronically Signed   By: Marnee Spring M.D.   On: 11/06/2016 16:28   Dg Chest Port 1 View  Result Date: 11/08/2016 CLINICAL DATA:  71 y/o  F; incision and drainage of neck abscess. EXAM: PORTABLE CHEST 1 VIEW COMPARISON:  01/02/2016 chest radiograph. FINDINGS: Stable cardiomegaly. Aortic atherosclerosis with calcification. Enteric tube tip below field of view and abdomen. Right axillary surgical clips. Left lung bases partially outside field of view. Visualized lung fields are clear. Curvilinear density in left base of neck, probably bandaging. Bones are unremarkable. IMPRESSION: Stable cardiomegaly. Aortic atherosclerosis. No acute pulmonary process identified. Electronically Signed   By: Mitzi Hansen M.D.   On: 11/08/2016 17:56   Dg Abd Portable 1v  Result Date: 11/08/2016 CLINICAL DATA:  71 y/o  F; enteric tube placement. EXAM: PORTABLE ABDOMEN - 1 VIEW COMPARISON:  None. FINDINGS: Insert 2 tip projects over gastric body. Retained contrast within the colon. Severe dextrocurvature and degenerative changes of the lumbar spine. Partially visualized total hip arthroplasty. Nonobstructive bowel gas pattern. IMPRESSION: Enteric tube tip projects over the gastric body. Electronically Signed   By: Mitzi Hansen M.D.   On: 11/08/2016 17:59   Dg Esophagus W/water Sol Cm  Result Date: 11/11/2016 CLINICAL DATA:  Followup esophageal leak. EXAM: ESOPHOGRAM/BARIUM SWALLOW TECHNIQUE: Single contrast examination was performed using water-soluble contrast. 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 There is evidence of leakage of contrast material out along the base of the Zenker's and into the prevertebral space at C7 and  T1. There is also leakage of contrast material superiorly  Patient ID: [name] MRN: [id number] DOB/AGE: 23-Sep-1945 71 y.o.  Admit date: 11/06/2016 Discharge date: 11/16/2016  Admission Diagnoses:  Principal Problem:   Abscess of neck Active Problems:   Diabetes mellitus type 2, insulin dependent ([place])   Discharge Diagnoses:  Principal Problem:   Abscess of neck Active Problems:   Diabetes mellitus type 2, insulin dependent ([place])  status post Procedure(s): REMOVAL OF GASTROSTOMY TUBE W/ REPLACEMENT OF GASTROSTOMY TUBE  Past Medical History:  Diagnosis Date  . Asthma   . Breast cancer ([place])    right breast  . COPD (chronic obstructive pulmonary disease) ([place])   . Coronary artery disease   . Elevated LFTs   . Emphysema lung ([place])   . ETOH abuse   . H/O atrial fibrillation without current medication    only one time when she had sepsis  . Hyperlipidemia   . Hypertension    hx of but not on any medications  . Myocardial infarction 2000  . OA (osteoarthritis) of knee   . Osteoarthritis   . Tobacco abuse     Surgeries: Procedure(s): REMOVAL OF GASTROSTOMY TUBE W/ REPLACEMENT OF GASTROSTOMY TUBE on 11/06/2016 - 11/14/2016   Consultants: Dr Erik Obey: ENT   Dr. Rush Farmer: General surgery   Dr Baxter Flattery: ID    Discharged Condition: Improved  Hospital Course: [name] is an 71 y.o. female who was admitted 11/06/2016 for operative treatment of Abscess of neck. Patient failed conservative treatments (please see the history and physical for the specifics) and had severe unremitting pain that affects sleep, daily activities and work/hobbies.  Patient admitted and started on broad spectrum IV antibiotics given concerning features of CT scan.  ENT evaluation requested.  Discussed care and initially planned on conservative management. Repeat CT scan done on 2/28 demonstrated progression of infection and so patient taken to the OR by Dr Wilburn Cornelia and myself for formal I&D and drain placement. Post operatively patient improved.   Repeat gastro-grafin swallow demonstrated an esophageal leak.  As a result general surgery consulted for feeding tube placement.  Patient had tube placed on 3/1.  The next day the tube mal-functioned and she was taken back to the OR for replacement.  Subsequently the feeding tube has functioned without issue. Patient has had PICC line placed for 6 week IV abx treatment. At this point there are no neurologically deficits or concerns about spinal stability. Will treat osteo with IV abx. Will eventually need to have esophageal leak addressed.   Patient was given perioperative antibiotics:  Anti-infectives    Start     Dose/Rate Route Frequency Ordered Stop   11/16/16 0000  vancomycin IVPB     1,500 mg Intravenous Every 12 hours 11/16/16 0841 01/05/17 2359   11/14/16 1500  ciprofloxacin (CIPRO) IVPB 400 mg  Status:  Discontinued    Comments:  Start in OR preop area   400 mg 200 mL/hr over 60 Minutes Intravenous  Once 11/14/16 1330 11/15/16 1214   11/13/16 1300  vancomycin (VANCOCIN) 1,500 mg in sodium chloride 0.9 % 500 mL IVPB     1,500 mg 250 mL/hr over 120 Minutes Intravenous Every 12 hours 11/13/16 1212     11/11/16 1000  vancomycin (VANCOCIN) 1,500 mg in sodium chloride 0.9 % 500 mL IVPB  Status:  Discontinued     1,500 mg 250 mL/hr over 120 Minutes Intravenous Every 12 hours 11/11/16 0014 11/13/16 1212   11/07/16 0000  vancomycin (VANCOCIN) IVPB 1000 mg/200 mL premix  Status:  Patient ID: [name] MRN: [id number] DOB/AGE: 23-Sep-1945 71 y.o.  Admit date: 11/06/2016 Discharge date: 11/16/2016  Admission Diagnoses:  Principal Problem:   Abscess of neck Active Problems:   Diabetes mellitus type 2, insulin dependent ([place])   Discharge Diagnoses:  Principal Problem:   Abscess of neck Active Problems:   Diabetes mellitus type 2, insulin dependent ([place])  status post Procedure(s): REMOVAL OF GASTROSTOMY TUBE W/ REPLACEMENT OF GASTROSTOMY TUBE  Past Medical History:  Diagnosis Date  . Asthma   . Breast cancer ([place])    right breast  . COPD (chronic obstructive pulmonary disease) ([place])   . Coronary artery disease   . Elevated LFTs   . Emphysema lung ([place])   . ETOH abuse   . H/O atrial fibrillation without current medication    only one time when she had sepsis  . Hyperlipidemia   . Hypertension    hx of but not on any medications  . Myocardial infarction 2000  . OA (osteoarthritis) of knee   . Osteoarthritis   . Tobacco abuse     Surgeries: Procedure(s): REMOVAL OF GASTROSTOMY TUBE W/ REPLACEMENT OF GASTROSTOMY TUBE on 11/06/2016 - 11/14/2016   Consultants: Dr Erik Obey: ENT   Dr. Rush Farmer: General surgery   Dr Baxter Flattery: ID    Discharged Condition: Improved  Hospital Course: [name] is an 71 y.o. female who was admitted 11/06/2016 for operative treatment of Abscess of neck. Patient failed conservative treatments (please see the history and physical for the specifics) and had severe unremitting pain that affects sleep, daily activities and work/hobbies.  Patient admitted and started on broad spectrum IV antibiotics given concerning features of CT scan.  ENT evaluation requested.  Discussed care and initially planned on conservative management. Repeat CT scan done on 2/28 demonstrated progression of infection and so patient taken to the OR by Dr Wilburn Cornelia and myself for formal I&D and drain placement. Post operatively patient improved.   Repeat gastro-grafin swallow demonstrated an esophageal leak.  As a result general surgery consulted for feeding tube placement.  Patient had tube placed on 3/1.  The next day the tube mal-functioned and she was taken back to the OR for replacement.  Subsequently the feeding tube has functioned without issue. Patient has had PICC line placed for 6 week IV abx treatment. At this point there are no neurologically deficits or concerns about spinal stability. Will treat osteo with IV abx. Will eventually need to have esophageal leak addressed.   Patient was given perioperative antibiotics:  Anti-infectives    Start     Dose/Rate Route Frequency Ordered Stop   11/16/16 0000  vancomycin IVPB     1,500 mg Intravenous Every 12 hours 11/16/16 0841 01/05/17 2359   11/14/16 1500  ciprofloxacin (CIPRO) IVPB 400 mg  Status:  Discontinued    Comments:  Start in OR preop area   400 mg 200 mL/hr over 60 Minutes Intravenous  Once 11/14/16 1330 11/15/16 1214   11/13/16 1300  vancomycin (VANCOCIN) 1,500 mg in sodium chloride 0.9 % 500 mL IVPB     1,500 mg 250 mL/hr over 120 Minutes Intravenous Every 12 hours 11/13/16 1212     11/11/16 1000  vancomycin (VANCOCIN) 1,500 mg in sodium chloride 0.9 % 500 mL IVPB  Status:  Discontinued     1,500 mg 250 mL/hr over 120 Minutes Intravenous Every 12 hours 11/11/16 0014 11/13/16 1212   11/07/16 0000  vancomycin (VANCOCIN) IVPB 1000 mg/200 mL premix  Status:

## 2016-11-16 NOTE — Progress Notes (Signed)
CM received call from [facility] rep, Jermaine confirming all is arranged for pt's discharge.  CM relayed message to RN.  No ohther CM needs were communicated.

## 2016-11-16 NOTE — Progress Notes (Signed)
Pt being discharged per orders from MD. Pt and family educated on discharge instructions. [facility] will be following pt. Pt verbalized understanding of instructions. All questions and concerns were addressed. Pt's PICC still intact. New dressings placed around PEG tube and neck. Pt exited hospital via wheelchair.

## 2016-11-20 DIAGNOSIS — Z5181 Encounter for therapeutic drug level monitoring: Secondary | ICD-10-CM | POA: Diagnosis not present

## 2016-11-21 DIAGNOSIS — F17210 Nicotine dependence, cigarettes, uncomplicated: Secondary | ICD-10-CM | POA: Diagnosis not present

## 2016-11-21 DIAGNOSIS — Z931 Gastrostomy status: Secondary | ICD-10-CM | POA: Diagnosis not present

## 2016-11-21 DIAGNOSIS — L0211 Cutaneous abscess of neck: Secondary | ICD-10-CM | POA: Diagnosis not present

## 2016-11-24 DIAGNOSIS — Z1321 Encounter for screening for nutritional disorder: Secondary | ICD-10-CM | POA: Diagnosis not present

## 2016-11-24 DIAGNOSIS — R700 Elevated erythrocyte sedimentation rate: Secondary | ICD-10-CM | POA: Diagnosis not present

## 2016-11-24 DIAGNOSIS — R6889 Other general symptoms and signs: Secondary | ICD-10-CM | POA: Diagnosis not present

## 2016-11-27 DIAGNOSIS — Z5181 Encounter for therapeutic drug level monitoring: Secondary | ICD-10-CM | POA: Diagnosis not present

## 2016-11-27 DIAGNOSIS — G061 Intraspinal abscess and granuloma: Secondary | ICD-10-CM | POA: Diagnosis not present

## 2016-12-01 DIAGNOSIS — G061 Intraspinal abscess and granuloma: Secondary | ICD-10-CM | POA: Diagnosis not present

## 2016-12-01 DIAGNOSIS — Z5181 Encounter for therapeutic drug level monitoring: Secondary | ICD-10-CM | POA: Diagnosis not present

## 2016-12-01 DIAGNOSIS — Z452 Encounter for adjustment and management of vascular access device: Secondary | ICD-10-CM | POA: Diagnosis not present

## 2016-12-04 DIAGNOSIS — Z130 Encounter for screening for diseases of the blood and blood-forming organs and certain disorders involving the immune mechanism: Secondary | ICD-10-CM | POA: Diagnosis not present

## 2016-12-08 DIAGNOSIS — Z5181 Encounter for therapeutic drug level monitoring: Secondary | ICD-10-CM | POA: Diagnosis not present

## 2016-12-08 DIAGNOSIS — G061 Intraspinal abscess and granuloma: Secondary | ICD-10-CM | POA: Diagnosis not present

## 2016-12-11 ENCOUNTER — Ambulatory Visit (INDEPENDENT_AMBULATORY_CARE_PROVIDER_SITE_OTHER): Payer: Medicare Other | Admitting: Internal Medicine

## 2016-12-11 ENCOUNTER — Telehealth: Payer: Self-pay | Admitting: *Deleted

## 2016-12-11 ENCOUNTER — Encounter: Payer: Self-pay | Admitting: Internal Medicine

## 2016-12-11 VITALS — BP 153/85 | HR 68 | Temp 97.7°F | Wt 135.0 lb

## 2016-12-11 DIAGNOSIS — G061 Intraspinal abscess and granuloma: Secondary | ICD-10-CM | POA: Diagnosis not present

## 2016-12-11 DIAGNOSIS — M4643 Discitis, unspecified, cervicothoracic region: Secondary | ICD-10-CM | POA: Diagnosis not present

## 2016-12-11 DIAGNOSIS — L0211 Cutaneous abscess of neck: Secondary | ICD-10-CM | POA: Diagnosis not present

## 2016-12-11 DIAGNOSIS — Z5181 Encounter for therapeutic drug level monitoring: Secondary | ICD-10-CM | POA: Diagnosis not present

## 2016-12-11 DIAGNOSIS — M4622 Osteomyelitis of vertebra, cervical region: Secondary | ICD-10-CM

## 2016-12-11 DIAGNOSIS — Q396 Congenital diverticulum of esophagus: Secondary | ICD-10-CM | POA: Diagnosis not present

## 2016-12-11 NOTE — Progress Notes (Signed)
UTI (urinary tract infection) 08/27/2014  . Sepsis ([place]) 08/27/2014  . Atrial fibrillation with RVR ([place]) 08/27/2014  . Shock circulatory ([place]) 08/27/2014  . Acute kidney injury ([place]) 08/27/2014  . Encephalopathy acute 08/27/2014  . Hypokalemia 08/26/2014  . Hyponatremia 08/26/2014  . Acute renal failure ([place]) 08/26/2014  . Weakness 08/26/2014  . ETOH abuse 08/07/2014  . Edema extremities 07/12/2014  . Atelectasis 08/16/2011  . Diabetes mellitus type 2, insulin dependent ([place])   . COPD (chronic obstructive  pulmonary disease) ([place])   . Tobacco dependence   . Hyperlipidemia   . Essential hypertension   . Coronary artery disease      Health Maintenance Due  Topic Date Due  . Hepatitis C Screening  April 12, 1946  . FOOT EXAM  02/09/1958  . OPHTHALMOLOGY EXAM  02/09/1958  . COLONOSCOPY  02/09/1998  . DEXA SCAN  02/09/2013  . PNA vac Low Risk Adult (2 of 2 - PPSV23) 02/09/2013  . INFLUENZA VACCINE  04/15/2016  . MAMMOGRAM  10/10/2016    Social History  Substance Use Topics  . Smoking status: Current Every Day Smoker    Packs/day: 1.00    Years: 50.00    Types: Cigarettes  . Smokeless tobacco: Never Used  . Alcohol use No    Review of Systems 12 point ros is negative except for weight loss Physical Exam   BP (!) 153/85   Pulse 68   Temp 97.7 F (36.5 C) (Oral)   Wt 135 lb (61.2 kg)   BMI 24.69 kg/m   Physical Exam  Constitutional:  oriented to person, place, and time. appears well-developed and well-nourished. No distress.  HENT: [place]/AT, PERRLA, no scleral icterus Neck: healing incision just left of midline Mouth/Throat: Oropharynx is clear and moist. No oropharyngeal exudate.  Cardiovascular: Normal rate, regular rhythm and normal heart sounds. Exam reveals no gallop and no friction rub.  No murmur heard.  Pulmonary/Chest: Effort normal and breath sounds normal. No respiratory distress.  has no wheezes.  Abdominal: Soft. Bowel sounds are normal.  exhibits no distension. There is no tenderness. Peg in place no discomfort Lymphadenopathy: no cervical adenopathy. No axillary adenopathy Neurological: alert and oriented to person, place, and time.  Ext: picc line site is c/d/i Psychiatric: a normal mood and affect.  behavior is normal.   CBC Lab Results  Component Value Date   WBC 8.1 11/11/2016   RBC 4.38 11/11/2016   HGB 12.6 11/11/2016   HCT 36.9 11/11/2016   PLT 286 11/11/2016   MCV 84.2 11/11/2016   MCH 28.8 11/11/2016   MCHC 34.1 11/11/2016   RDW 14.3 11/11/2016     LYMPHSABS 1.6 11/11/2016   MONOABS 0.8 11/11/2016   EOSABS 0.2 11/11/2016    BMET Lab Results  Component Value Date   NA 136 11/13/2016   K 4.1 11/13/2016   CL 106 11/13/2016   CO2 24 11/13/2016   GLUCOSE 116 (H) 11/13/2016   BUN 6 11/13/2016   CREATININE 0.32 (L) 11/13/2016   CALCIUM 8.4 (L) 11/13/2016   GFRNONAA >60 11/13/2016   GFRAA >60 11/13/2016    I have reviewed labs and records  Assessment and Plan  Cervical/thoracic osteomyelitis = we will plan to continue her IV abtx through end of April to complete 8 wk course..   Neck abscess= continues to get iv abtx for treatment.  Esophageal diverticulumWill need to coordinate with patient and upcoming ENT procedure at [facility]  RFV: hospital follow up for osteomyelitis  Patient ID: [name], female   DOB: 1945-11-16, 71 y.o.   MRN: [id number]  HPI Patient admitted at end of februarly for cervical/thoracic osteo with abscess. Cultures were negative and she was  Seen by dr comer who prescribed 6 wk of vancomycin plus meropenem for 6-8 wk thru April 20th.   Complicated hospitlalizaiton which showed that she had esophageal fistula causing deep tissue abscess/. With cervical-thoracic osteo via gastrografin swallow study that showed leaking from superior and inferior aspect of the diverticulum.   She also had ruptured balloon of g-tube which was replaced on 3/2  She has followed up with ENT who is referring her to [facility] for next phase of repair  Outpatient Encounter Prescriptions as of 12/11/2016  Medication Sig  . albuterol (PROVENTIL HFA;VENTOLIN HFA) 108 (90 BASE) MCG/ACT inhaler Inhale 2 puffs into the lungs every 6 (six) hours as needed for wheezing.   Marland Kitchen ALPRAZolam (XANAX) 0.25 MG tablet Take one tablet daily or as needed (Patient taking differently: Take 0.25 mg by mouth daily as needed for anxiety. )  . aspirin EC 81 MG tablet Take 1 tablet (81 mg total) by mouth daily. Start on POD #2  . aspirin EC 81 MG tablet Take 1 tablet (81 mg total) by mouth daily. Start on POD #2  . budesonide-formoterol (SYMBICORT) 160-4.5 MCG/ACT inhaler Inhale 2 puffs into the lungs 2 (two) times daily as needed (Wheezing). Reported on 10/16/2015  . chlorhexidine (PERIDEX) 0.12 % solution Use as directed 5 mLs in the mouth or throat 4 (four) times daily.  . furosemide (LASIX) 40 MG tablet Take 1 tablet (40 mg total) by mouth 2 (two) times daily.  Marland Kitchen gabapentin (NEURONTIN) 600 MG tablet Take 600 mg by mouth 4 (four) times daily.   Marland Kitchen losartan (COZAAR) 25 MG tablet Take 25 mg by mouth daily.   . Oxycodone HCl 10 MG TABS Take 1-3 tablets (10-30 mg total) by mouth every 4 (four) hours as needed.  . Probiotic  Product (PROBIOTIC PO) Take 1 capsule by mouth every other day.  . vancomycin IVPB Inject 1,500 mg into the vein every 12 (twelve) hours. Indication:  Neck abscess / osteomyelitis Last Day of Therapy:  01/02/17 Labs - Sunday/Monday:  CBC/D, BMP, and vancomycin trough. Labs - Thursday:  BMP and vancomycin trough Labs - Every other week:  ESR and CRP   No facility-administered encounter medications on file as of 12/11/2016.      Patient Active Problem List   Diagnosis Date Noted  . Tobacco consumption 02/12/2016  . Hoarseness of voice 01/07/2016  . 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 Infected wound, initial encounter 06/20/2015  . Cholecystitis   . Esophageal fistula   . Abscess   . Discitis   . HCAP (healthcare-associated pneumonia)   . 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[place]) 04/28/2015  . Abscess of neck   . Esophageal perforation   . Loss of weight   . Esophageal dysphagia   . 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 Hip dislocation, right ([place]) 04/26/2015  . Healthcare-associated pneumonia 04/26/2015  . Cholecystitis, acute 04/26/2015  . 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[place]) 04/26/2015  . PAF (paroxysmal atrial fibrillation) ([place]) 09/20/2014  . Elevated troponin 09/20/2014  . Acute renal failure syndrome ([place])   . Malnutrition of moderate degree ([place]) 08/29/2014  . Pneumonia   . Respiratory failure ([place])   .

## 2016-12-11 NOTE — Telephone Encounter (Signed)
Per Dr Baxter Flattery called [facility] to have [facility] extend the patient's IV antibiotics until 01/10/17. Mary read back and verified the order.

## 2016-12-15 DIAGNOSIS — Z5181 Encounter for therapeutic drug level monitoring: Secondary | ICD-10-CM | POA: Diagnosis not present

## 2016-12-15 DIAGNOSIS — G061 Intraspinal abscess and granuloma: Secondary | ICD-10-CM | POA: Diagnosis not present

## 2016-12-18 DIAGNOSIS — G061 Intraspinal abscess and granuloma: Secondary | ICD-10-CM | POA: Diagnosis not present

## 2016-12-18 DIAGNOSIS — Z5181 Encounter for therapeutic drug level monitoring: Secondary | ICD-10-CM | POA: Diagnosis not present

## 2016-12-22 DIAGNOSIS — D649 Anemia, unspecified: Secondary | ICD-10-CM | POA: Diagnosis not present

## 2016-12-25 DIAGNOSIS — G061 Intraspinal abscess and granuloma: Secondary | ICD-10-CM | POA: Diagnosis not present

## 2016-12-25 DIAGNOSIS — Z5181 Encounter for therapeutic drug level monitoring: Secondary | ICD-10-CM | POA: Diagnosis not present

## 2016-12-29 DIAGNOSIS — Z853 Personal history of malignant neoplasm of breast: Secondary | ICD-10-CM | POA: Diagnosis not present

## 2016-12-29 DIAGNOSIS — Z5181 Encounter for therapeutic drug level monitoring: Secondary | ICD-10-CM | POA: Diagnosis not present

## 2016-12-30 DIAGNOSIS — T814XXD Infection following a procedure, subsequent encounter: Secondary | ICD-10-CM | POA: Diagnosis not present

## 2017-01-02 DIAGNOSIS — Z1321 Encounter for screening for nutritional disorder: Secondary | ICD-10-CM | POA: Diagnosis not present

## 2017-01-05 DIAGNOSIS — M4622 Osteomyelitis of vertebra, cervical region: Secondary | ICD-10-CM | POA: Diagnosis not present

## 2017-01-05 DIAGNOSIS — Z931 Gastrostomy status: Secondary | ICD-10-CM | POA: Diagnosis not present

## 2017-01-05 DIAGNOSIS — L0211 Cutaneous abscess of neck: Secondary | ICD-10-CM | POA: Diagnosis not present

## 2017-01-05 DIAGNOSIS — F17210 Nicotine dependence, cigarettes, uncomplicated: Secondary | ICD-10-CM | POA: Diagnosis not present

## 2017-01-05 DIAGNOSIS — G061 Intraspinal abscess and granuloma: Secondary | ICD-10-CM | POA: Diagnosis not present

## 2017-01-05 DIAGNOSIS — Z5181 Encounter for therapeutic drug level monitoring: Secondary | ICD-10-CM | POA: Diagnosis not present

## 2017-01-08 ENCOUNTER — Encounter: Payer: Self-pay | Admitting: Internal Medicine

## 2017-01-08 ENCOUNTER — Ambulatory Visit (INDEPENDENT_AMBULATORY_CARE_PROVIDER_SITE_OTHER): Payer: Medicare Other | Admitting: Internal Medicine

## 2017-01-08 VITALS — BP 120/71 | HR 56 | Temp 98.3°F | Ht 62.0 in | Wt 135.0 lb

## 2017-01-08 DIAGNOSIS — M4643 Discitis, unspecified, cervicothoracic region: Secondary | ICD-10-CM | POA: Diagnosis not present

## 2017-01-08 DIAGNOSIS — K2289 Other specified disease of esophagus: Secondary | ICD-10-CM

## 2017-01-08 DIAGNOSIS — K228 Other specified diseases of esophagus: Secondary | ICD-10-CM | POA: Diagnosis not present

## 2017-01-08 DIAGNOSIS — Q396 Congenital diverticulum of esophagus: Secondary | ICD-10-CM

## 2017-01-08 NOTE — Progress Notes (Signed)
Patient ID: [name], female   DOB: 1946/06/05, 71 y.o.   MRN: [id number]  HPI Patient admitted at end of februarly for cervical/thoracic osteo with abscess secondary to esophageal diverticulum suspected fistula causing deep tissue abscess. Gastrografin swallow stufy showed leaking from superior and inferior aspect of diverticulum. Cultures were negative and she was  Seen by dr comer who prescribed 6 wk of vancomycin plus meropenem for 6-8 wk thru April 20th.   She also had ruptured balloon of g-tube which was replaced on 3/2  She has followed up with ENT who is referring her to a ENT specialist at [facility] for next phase of repair. Patient is looking forward to next phase so that she can stop her peg feeding  Outpatient Encounter Prescriptions as of 01/08/2017  Medication Sig  . albuterol (PROVENTIL HFA;VENTOLIN HFA) 108 (90 BASE) MCG/ACT inhaler Inhale 2 puffs into the lungs every 6 (six) hours as needed for wheezing.   Marland Kitchen ALPRAZolam (XANAX) 0.25 MG tablet Take one tablet daily or as needed (Patient taking differently: Take 0.25 mg by mouth daily as needed for anxiety. )  . aspirin EC 81 MG tablet Take 1 tablet (81 mg total) by mouth daily. Start on POD #2  . aspirin EC 81 MG tablet Take 1 tablet (81 mg total) by mouth daily. Start on POD #2  . BIOFREEZE 4 % GEL APPLY TO AFFECTED AREA AS NEEDED  . chlorhexidine (PERIDEX) 0.12 % solution Use as directed 5 mLs in the mouth or throat 4 (four) times daily.  . diclofenac (VOLTAREN) 75 MG EC tablet   . furosemide (LASIX) 40 MG tablet Take 1 tablet (40 mg total) by mouth 2 (two) times daily.  Marland Kitchen gabapentin (NEURONTIN) 600 MG tablet Take 600 mg by mouth 4 (four) times daily.   Marland Kitchen losartan (COZAAR) 25 MG tablet Take 25 mg by mouth daily.   Marland Kitchen MEROPENEM IV Inject into the vein.  . methocarbamol (ROBAXIN) 500 MG tablet TAKE 1 TABLET BY MOUTH EVERY 8 HOURS FOR SPASM  . morphine (MS CONTIN) 30 MG 12 hr tablet   . oxyCODONE (ROXICODONE) 15 MG  immediate release tablet   . Probiotic Product (PROBIOTIC PO) Take 1 capsule by mouth every other day.  . varenicline (CHANTIX STARTING MONTH PAK) 0.5 MG X 11 & 1 MG X 42 tablet AS DIRECTED AS DIRECTED ORALLY 30 DAYS  . budesonide-formoterol (SYMBICORT) 160-4.5 MCG/ACT inhaler Inhale 2 puffs into the lungs 2 (two) times daily as needed (Wheezing). Reported on 10/16/2015  . [DISCONTINUED] Oxycodone HCl 10 MG TABS Take 1-3 tablets (10-30 mg total) by mouth every 4 (four) hours as needed.   No facility-administered encounter medications on file as of 01/08/2017.      Patient Active Problem List   Diagnosis Date Noted  . Tobacco consumption 02/12/2016  . Hoarseness of voice 01/07/2016  . 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 Infected wound, initial encounter 06/20/2015  . Cholecystitis   . Esophageal fistula   . Abscess   . Discitis   . HCAP (healthcare-associated pneumonia)   . 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[place]) 04/28/2015  . Abscess of neck   . Esophageal perforation   . Loss of weight   . Esophageal dysphagia   . 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 Hip dislocation, right ([place]) 04/26/2015  . Healthcare-associated pneumonia 04/26/2015  . Cholecystitis, acute 04/26/2015  . 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[place]) 04/26/2015  .  Patient ID: [name], female   DOB: 1946/06/05, 71 y.o.   MRN: [id number]  HPI Patient admitted at end of februarly for cervical/thoracic osteo with abscess secondary to esophageal diverticulum suspected fistula causing deep tissue abscess. Gastrografin swallow stufy showed leaking from superior and inferior aspect of diverticulum. Cultures were negative and she was  Seen by dr comer who prescribed 6 wk of vancomycin plus meropenem for 6-8 wk thru April 20th.   She also had ruptured balloon of g-tube which was replaced on 3/2  She has followed up with ENT who is referring her to a ENT specialist at [facility] for next phase of repair. Patient is looking forward to next phase so that she can stop her peg feeding  Outpatient Encounter Prescriptions as of 01/08/2017  Medication Sig  . albuterol (PROVENTIL HFA;VENTOLIN HFA) 108 (90 BASE) MCG/ACT inhaler Inhale 2 puffs into the lungs every 6 (six) hours as needed for wheezing.   Marland Kitchen ALPRAZolam (XANAX) 0.25 MG tablet Take one tablet daily or as needed (Patient taking differently: Take 0.25 mg by mouth daily as needed for anxiety. )  . aspirin EC 81 MG tablet Take 1 tablet (81 mg total) by mouth daily. Start on POD #2  . aspirin EC 81 MG tablet Take 1 tablet (81 mg total) by mouth daily. Start on POD #2  . BIOFREEZE 4 % GEL APPLY TO AFFECTED AREA AS NEEDED  . chlorhexidine (PERIDEX) 0.12 % solution Use as directed 5 mLs in the mouth or throat 4 (four) times daily.  . diclofenac (VOLTAREN) 75 MG EC tablet   . furosemide (LASIX) 40 MG tablet Take 1 tablet (40 mg total) by mouth 2 (two) times daily.  Marland Kitchen gabapentin (NEURONTIN) 600 MG tablet Take 600 mg by mouth 4 (four) times daily.   Marland Kitchen losartan (COZAAR) 25 MG tablet Take 25 mg by mouth daily.   Marland Kitchen MEROPENEM IV Inject into the vein.  . methocarbamol (ROBAXIN) 500 MG tablet TAKE 1 TABLET BY MOUTH EVERY 8 HOURS FOR SPASM  . morphine (MS CONTIN) 30 MG 12 hr tablet   . oxyCODONE (ROXICODONE) 15 MG  immediate release tablet   . Probiotic Product (PROBIOTIC PO) Take 1 capsule by mouth every other day.  . varenicline (CHANTIX STARTING MONTH PAK) 0.5 MG X 11 & 1 MG X 42 tablet AS DIRECTED AS DIRECTED ORALLY 30 DAYS  . budesonide-formoterol (SYMBICORT) 160-4.5 MCG/ACT inhaler Inhale 2 puffs into the lungs 2 (two) times daily as needed (Wheezing). Reported on 10/16/2015  . [DISCONTINUED] Oxycodone HCl 10 MG TABS Take 1-3 tablets (10-30 mg total) by mouth every 4 (four) hours as needed.   No facility-administered encounter medications on file as of 01/08/2017.      Patient Active Problem List   Diagnosis Date Noted  . Tobacco consumption 02/12/2016  . Hoarseness of voice 01/07/2016  . 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 Infected wound, initial encounter 06/20/2015  . Cholecystitis   . Esophageal fistula   . Abscess   . Discitis   . HCAP (healthcare-associated pneumonia)   . 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[place]) 04/28/2015  . Abscess of neck   . Esophageal perforation   . Loss of weight   . Esophageal dysphagia   . 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 Hip dislocation, right ([place]) 04/26/2015  . Healthcare-associated pneumonia 04/26/2015  . Cholecystitis, acute 04/26/2015  . 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[place]) 04/26/2015  .  PAF (paroxysmal atrial fibrillation) ([place]) 09/20/2014  . Elevated troponin 09/20/2014  . Acute renal failure syndrome ([place])   . Malnutrition of moderate degree ([place]) 08/29/2014  . Pneumonia   . Respiratory failure ([place])   . UTI (urinary tract infection) 08/27/2014  . Sepsis ([place]) 08/27/2014  . Atrial fibrillation with RVR ([place]) 08/27/2014  . Shock circulatory ([place]) 08/27/2014  . Acute kidney injury ([place]) 08/27/2014  . Encephalopathy acute 08/27/2014  . Hypokalemia 08/26/2014  . Hyponatremia 08/26/2014  . Acute renal failure ([place]) 08/26/2014  . Weakness 08/26/2014  . ETOH abuse 08/07/2014  . Edema  extremities 07/12/2014  . Atelectasis 08/16/2011  . Diabetes mellitus type 2, insulin dependent ([place])   . COPD (chronic obstructive pulmonary disease) ([place])   . Tobacco dependence   . Hyperlipidemia   . Essential hypertension   . Coronary artery disease      Health Maintenance Due  Topic Date Due  . Hepatitis C Screening  [birth year]  . FOOT EXAM  02/09/1958  . OPHTHALMOLOGY EXAM  02/09/1958  . COLONOSCOPY  02/09/1998  . DEXA SCAN  02/09/2013  . PNA vac Low Risk Adult (2 of 2 - PPSV23) 02/09/2013  . MAMMOGRAM  10/10/2016     Review of Systems Improved appetite, and energery. Otherwise 12 point ros is negative Physical Exam   BP 120/71   Pulse (!) 56   Temp 98.3 F (36.8 C) (Oral)   Ht 5\' 2"  (1.575 m)   Wt 135 lb (61.2 kg)   BMI 24.69 kg/m   Physical Exam  Constitutional:  oriented to person, place, and time. appears well-developed and well-nourished. No distress.  HENT: [place]/AT, PERRLA, no scleral icterus Mouth/Throat: Oropharynx is clear and moist. No oropharyngeal exudate.  Cardiovascular: Normal rate, regular rhythm and normal heart sounds. Exam reveals no gallop and no friction rub.  No murmur heard.  Neck: incision is well healed Pulmonary/Chest: Effort normal and breath sounds normal. No respiratory distress.  has no wheezes.  Neck = supple, no nuchal rigidity Abdominal: Soft. Bowel sounds are normal.  exhibits no distension. There is no tenderness. Peg in place Lymphadenopathy: no cervical adenopathy. No axillary adenopathy Ext: picc line site is c/d/i Skin: Skin is warm and dry. No rash noted. No erythema.  Psychiatric: a normal mood and affect.  behavior is normal.   CBC Lab Results  Component Value Date   WBC 8.1 11/11/2016   RBC 4.38 11/11/2016   HGB 12.6 11/11/2016   HCT 36.9 11/11/2016   PLT 286 11/11/2016   MCV 84.2 11/11/2016   MCH 28.8 11/11/2016   MCHC 34.1 11/11/2016   RDW 14.3 11/11/2016   LYMPHSABS 1.6 11/11/2016   MONOABS 0.8 11/11/2016     EOSABS 0.2 11/11/2016    BMET Lab Results  Component Value Date   NA 136 11/13/2016   K 4.1 11/13/2016   CL 106 11/13/2016   CO2 24 11/13/2016   GLUCOSE 116 (H) 11/13/2016   BUN 6 11/13/2016   CREATININE 0.32 (L) 11/13/2016   CALCIUM 8.4 (L) 11/13/2016   GFRNONAA >60 11/13/2016   GFRAA >60 11/13/2016   Lab Results  Component Value Date   ESRSEDRATE 52 (H) 11/08/2016   Lab Results  Component Value Date   CRP 1.9 (H) 11/08/2016      Assessment and Plan  Cervical/thoracic osteo due to esophageal diverticula-fistula = she has been on 8 wk of IV abtx. I have reviewed her labs, inflammatory markers are improved. Ok to pull picc line

## 2017-01-09 ENCOUNTER — Telehealth: Payer: Self-pay | Admitting: *Deleted

## 2017-01-09 NOTE — Telephone Encounter (Signed)
Verbal order given to Debbie at [facility] per Dr. Baxter Flattery to pull patient's picc line today. [name]

## 2017-01-14 DIAGNOSIS — Z87891 Personal history of nicotine dependence: Secondary | ICD-10-CM | POA: Diagnosis not present

## 2017-01-14 DIAGNOSIS — I252 Old myocardial infarction: Secondary | ICD-10-CM | POA: Diagnosis not present

## 2017-01-14 DIAGNOSIS — J45909 Unspecified asthma, uncomplicated: Secondary | ICD-10-CM | POA: Diagnosis not present

## 2017-01-14 DIAGNOSIS — Z931 Gastrostomy status: Secondary | ICD-10-CM | POA: Diagnosis not present

## 2017-01-14 DIAGNOSIS — E785 Hyperlipidemia, unspecified: Secondary | ICD-10-CM | POA: Diagnosis not present

## 2017-01-14 DIAGNOSIS — I10 Essential (primary) hypertension: Secondary | ICD-10-CM | POA: Diagnosis not present

## 2017-01-14 DIAGNOSIS — I2510 Atherosclerotic heart disease of native coronary artery without angina pectoris: Secondary | ICD-10-CM | POA: Diagnosis not present

## 2017-01-14 DIAGNOSIS — K225 Diverticulum of esophagus, acquired: Secondary | ICD-10-CM | POA: Diagnosis not present

## 2017-01-14 DIAGNOSIS — E119 Type 2 diabetes mellitus without complications: Secondary | ICD-10-CM | POA: Diagnosis not present

## 2017-01-26 DIAGNOSIS — Z87891 Personal history of nicotine dependence: Secondary | ICD-10-CM | POA: Diagnosis not present

## 2017-01-26 DIAGNOSIS — Z880 Allergy status to penicillin: Secondary | ICD-10-CM | POA: Diagnosis not present

## 2017-01-26 DIAGNOSIS — Z79899 Other long term (current) drug therapy: Secondary | ICD-10-CM | POA: Diagnosis not present

## 2017-01-26 DIAGNOSIS — Z881 Allergy status to other antibiotic agents status: Secondary | ICD-10-CM | POA: Diagnosis not present

## 2017-01-26 DIAGNOSIS — K228 Other specified diseases of esophagus: Secondary | ICD-10-CM | POA: Diagnosis not present

## 2017-01-26 DIAGNOSIS — Z8679 Personal history of other diseases of the circulatory system: Secondary | ICD-10-CM | POA: Insufficient documentation

## 2017-01-26 DIAGNOSIS — G8929 Other chronic pain: Secondary | ICD-10-CM | POA: Diagnosis not present

## 2017-01-26 DIAGNOSIS — K225 Diverticulum of esophagus, acquired: Secondary | ICD-10-CM | POA: Diagnosis not present

## 2017-01-26 DIAGNOSIS — I2510 Atherosclerotic heart disease of native coronary artery without angina pectoris: Secondary | ICD-10-CM | POA: Diagnosis not present

## 2017-01-26 DIAGNOSIS — F419 Anxiety disorder, unspecified: Secondary | ICD-10-CM | POA: Diagnosis not present

## 2017-01-26 DIAGNOSIS — J392 Other diseases of pharynx: Secondary | ICD-10-CM | POA: Diagnosis not present

## 2017-01-26 DIAGNOSIS — I10 Essential (primary) hypertension: Secondary | ICD-10-CM | POA: Diagnosis not present

## 2017-01-26 DIAGNOSIS — Z794 Long term (current) use of insulin: Secondary | ICD-10-CM | POA: Diagnosis not present

## 2017-01-26 DIAGNOSIS — E785 Hyperlipidemia, unspecified: Secondary | ICD-10-CM | POA: Diagnosis not present

## 2017-01-26 DIAGNOSIS — I480 Paroxysmal atrial fibrillation: Secondary | ICD-10-CM | POA: Diagnosis not present

## 2017-01-26 DIAGNOSIS — M545 Low back pain: Secondary | ICD-10-CM | POA: Diagnosis not present

## 2017-01-26 DIAGNOSIS — Z955 Presence of coronary angioplasty implant and graft: Secondary | ICD-10-CM | POA: Diagnosis not present

## 2017-01-26 DIAGNOSIS — I252 Old myocardial infarction: Secondary | ICD-10-CM | POA: Diagnosis not present

## 2017-01-26 DIAGNOSIS — R9431 Abnormal electrocardiogram [ECG] [EKG]: Secondary | ICD-10-CM | POA: Diagnosis not present

## 2017-01-26 DIAGNOSIS — Z853 Personal history of malignant neoplasm of breast: Secondary | ICD-10-CM | POA: Diagnosis not present

## 2017-01-26 DIAGNOSIS — J449 Chronic obstructive pulmonary disease, unspecified: Secondary | ICD-10-CM | POA: Diagnosis not present

## 2017-01-26 DIAGNOSIS — Z7982 Long term (current) use of aspirin: Secondary | ICD-10-CM | POA: Diagnosis not present

## 2017-01-26 DIAGNOSIS — E119 Type 2 diabetes mellitus without complications: Secondary | ICD-10-CM | POA: Diagnosis not present

## 2017-02-03 DIAGNOSIS — Z853 Personal history of malignant neoplasm of breast: Secondary | ICD-10-CM | POA: Diagnosis not present

## 2017-02-03 DIAGNOSIS — Z87891 Personal history of nicotine dependence: Secondary | ICD-10-CM | POA: Diagnosis not present

## 2017-02-03 DIAGNOSIS — E119 Type 2 diabetes mellitus without complications: Secondary | ICD-10-CM | POA: Diagnosis present

## 2017-02-03 DIAGNOSIS — Z79899 Other long term (current) drug therapy: Secondary | ICD-10-CM | POA: Diagnosis not present

## 2017-02-03 DIAGNOSIS — M545 Low back pain: Secondary | ICD-10-CM | POA: Diagnosis not present

## 2017-02-03 DIAGNOSIS — J392 Other diseases of pharynx: Secondary | ICD-10-CM | POA: Diagnosis present

## 2017-02-03 DIAGNOSIS — G8918 Other acute postprocedural pain: Secondary | ICD-10-CM | POA: Diagnosis not present

## 2017-02-03 DIAGNOSIS — E785 Hyperlipidemia, unspecified: Secondary | ICD-10-CM | POA: Diagnosis present

## 2017-02-03 DIAGNOSIS — Z881 Allergy status to other antibiotic agents status: Secondary | ICD-10-CM | POA: Diagnosis not present

## 2017-02-03 DIAGNOSIS — F419 Anxiety disorder, unspecified: Secondary | ICD-10-CM | POA: Diagnosis present

## 2017-02-03 DIAGNOSIS — J449 Chronic obstructive pulmonary disease, unspecified: Secondary | ICD-10-CM | POA: Diagnosis present

## 2017-02-03 DIAGNOSIS — K228 Other specified diseases of esophagus: Secondary | ICD-10-CM | POA: Diagnosis present

## 2017-02-03 DIAGNOSIS — I252 Old myocardial infarction: Secondary | ICD-10-CM | POA: Diagnosis not present

## 2017-02-03 DIAGNOSIS — I480 Paroxysmal atrial fibrillation: Secondary | ICD-10-CM | POA: Diagnosis present

## 2017-02-03 DIAGNOSIS — J390 Retropharyngeal and parapharyngeal abscess: Secondary | ICD-10-CM | POA: Diagnosis not present

## 2017-02-03 DIAGNOSIS — G8928 Other chronic postprocedural pain: Secondary | ICD-10-CM | POA: Diagnosis not present

## 2017-02-03 DIAGNOSIS — K225 Diverticulum of esophagus, acquired: Secondary | ICD-10-CM | POA: Diagnosis not present

## 2017-02-03 DIAGNOSIS — G8929 Other chronic pain: Secondary | ICD-10-CM | POA: Diagnosis present

## 2017-02-03 DIAGNOSIS — I10 Essential (primary) hypertension: Secondary | ICD-10-CM | POA: Diagnosis present

## 2017-02-03 DIAGNOSIS — I2510 Atherosclerotic heart disease of native coronary artery without angina pectoris: Secondary | ICD-10-CM | POA: Diagnosis present

## 2017-02-03 DIAGNOSIS — Z794 Long term (current) use of insulin: Secondary | ICD-10-CM | POA: Diagnosis not present

## 2017-02-03 DIAGNOSIS — Z880 Allergy status to penicillin: Secondary | ICD-10-CM | POA: Diagnosis not present

## 2017-02-03 DIAGNOSIS — Z955 Presence of coronary angioplasty implant and graft: Secondary | ICD-10-CM | POA: Diagnosis not present

## 2017-02-03 DIAGNOSIS — M549 Dorsalgia, unspecified: Secondary | ICD-10-CM | POA: Diagnosis not present

## 2017-02-03 DIAGNOSIS — Z7982 Long term (current) use of aspirin: Secondary | ICD-10-CM | POA: Diagnosis not present

## 2017-02-10 ENCOUNTER — Other Ambulatory Visit: Payer: Self-pay | Admitting: Cardiology

## 2017-02-16 DIAGNOSIS — K225 Diverticulum of esophagus, acquired: Secondary | ICD-10-CM | POA: Diagnosis not present

## 2017-02-16 DIAGNOSIS — Z9889 Other specified postprocedural states: Secondary | ICD-10-CM | POA: Diagnosis not present

## 2017-02-16 DIAGNOSIS — Z4802 Encounter for removal of sutures: Secondary | ICD-10-CM | POA: Diagnosis not present

## 2017-02-16 NOTE — Addendum Note (Signed)
Addendum  created 02/16/17 1136 by Bladen Umar, MD   Sign clinical note    

## 2017-02-17 DIAGNOSIS — K225 Diverticulum of esophagus, acquired: Secondary | ICD-10-CM | POA: Diagnosis not present

## 2017-02-17 DIAGNOSIS — Z9889 Other specified postprocedural states: Secondary | ICD-10-CM | POA: Diagnosis not present

## 2017-02-19 ENCOUNTER — Telehealth: Payer: Self-pay | Admitting: *Deleted

## 2017-02-19 NOTE — Telephone Encounter (Signed)
Robin from Dr. Vicie Mutters (ENT) called. Patient had surgery to close the fistula.  She was eating/drinking, but has developed a leak.  Culture and sensitivities are being faxed, ENT is looking for advice to treat. Landis Gandy, RN

## 2017-02-23 ENCOUNTER — Other Ambulatory Visit: Payer: Self-pay | Admitting: *Deleted

## 2017-02-23 NOTE — Telephone Encounter (Signed)
Can you send in rx for cephalexin 500mg  PO QID per her peg. Can you add her into clinic on Thursday as an overbook.

## 2017-02-23 NOTE — Telephone Encounter (Signed)
Please advise - this rx was flagged as contraindicated because of her allergy to penicillin (rash). If still ok to prescribe, please advise number of pills and any refills. Thanks!

## 2017-02-23 NOTE — Telephone Encounter (Signed)
It should be ok.

## 2017-02-24 NOTE — Telephone Encounter (Signed)
Pills for 30 d

## 2017-02-24 NOTE — Telephone Encounter (Signed)
Spoke with patient. She is currently taking clindamycin from Dr Vicie Mutters, will finish Wednesday.  Should she start the cephalexin after?

## 2017-02-26 ENCOUNTER — Other Ambulatory Visit: Payer: Self-pay | Admitting: *Deleted

## 2017-02-26 MED ORDER — CEPHALEXIN 500 MG PO CAPS: 500.0000 mg | ORAL_CAPSULE | Freq: Four times a day (QID) | ORAL | 0 refills | Status: DC

## 2017-02-26 NOTE — Telephone Encounter (Signed)
Rx sent to [facility] and patient notified. [name]

## 2017-03-02 DIAGNOSIS — Z9889 Other specified postprocedural states: Secondary | ICD-10-CM | POA: Diagnosis not present

## 2017-03-02 DIAGNOSIS — K225 Diverticulum of esophagus, acquired: Secondary | ICD-10-CM | POA: Diagnosis not present

## 2017-03-02 DIAGNOSIS — L988 Other specified disorders of the skin and subcutaneous tissue: Secondary | ICD-10-CM | POA: Diagnosis not present

## 2017-03-10 DIAGNOSIS — J029 Acute pharyngitis, unspecified: Secondary | ICD-10-CM | POA: Diagnosis not present

## 2017-03-11 DIAGNOSIS — Z79891 Long term (current) use of opiate analgesic: Secondary | ICD-10-CM | POA: Diagnosis not present

## 2017-03-11 DIAGNOSIS — M542 Cervicalgia: Secondary | ICD-10-CM | POA: Diagnosis not present

## 2017-03-27 NOTE — Addendum Note (Signed)
Addendum  created 03/27/17 0925 by Nolon Nations, MD   Sign clinical note

## 2017-03-27 NOTE — Anesthesia Postprocedure Evaluation (Signed)
Anesthesia Post Note  Patient: [name]  Procedure(s) Performed: Procedure(s) (LRB): REMOVAL OF GASTROSTOMY TUBE W/ REPLACEMENT OF GASTROSTOMY TUBE (N/A)     Anesthesia Post Evaluation  Last Vitals:  Vitals:   11/16/16 1039 11/16/16 1419  BP: (!) 150/64 (!) 146/70  Pulse: 60 67  Resp: 18 18  Temp: 36.8 C 36.7 C    Last Pain:  Vitals:   11/16/16 1419  TempSrc: Oral  PainSc:                  Nolon Nations

## 2017-04-01 DIAGNOSIS — K228 Other specified diseases of esophagus: Secondary | ICD-10-CM | POA: Diagnosis not present

## 2017-04-01 DIAGNOSIS — L0211 Cutaneous abscess of neck: Secondary | ICD-10-CM | POA: Diagnosis not present

## 2017-04-01 DIAGNOSIS — K225 Diverticulum of esophagus, acquired: Secondary | ICD-10-CM | POA: Diagnosis not present

## 2017-04-01 DIAGNOSIS — Z87891 Personal history of nicotine dependence: Secondary | ICD-10-CM | POA: Diagnosis not present

## 2017-04-02 DIAGNOSIS — K225 Diverticulum of esophagus, acquired: Secondary | ICD-10-CM | POA: Diagnosis not present

## 2017-04-02 DIAGNOSIS — K228 Other specified diseases of esophagus: Secondary | ICD-10-CM | POA: Diagnosis not present

## 2017-04-02 DIAGNOSIS — L0211 Cutaneous abscess of neck: Secondary | ICD-10-CM | POA: Diagnosis not present

## 2017-04-02 DIAGNOSIS — Z4682 Encounter for fitting and adjustment of non-vascular catheter: Secondary | ICD-10-CM | POA: Diagnosis not present

## 2017-04-03 DIAGNOSIS — K9423 Gastrostomy malfunction: Secondary | ICD-10-CM | POA: Diagnosis not present

## 2017-04-04 DIAGNOSIS — T8183XA Persistent postprocedural fistula, initial encounter: Secondary | ICD-10-CM | POA: Diagnosis present

## 2017-04-04 DIAGNOSIS — Z90710 Acquired absence of both cervix and uterus: Secondary | ICD-10-CM | POA: Diagnosis not present

## 2017-04-04 DIAGNOSIS — Z880 Allergy status to penicillin: Secondary | ICD-10-CM | POA: Diagnosis not present

## 2017-04-04 DIAGNOSIS — M47812 Spondylosis without myelopathy or radiculopathy, cervical region: Secondary | ICD-10-CM | POA: Diagnosis present

## 2017-04-04 DIAGNOSIS — Z7982 Long term (current) use of aspirin: Secondary | ICD-10-CM | POA: Diagnosis not present

## 2017-04-04 DIAGNOSIS — Z79891 Long term (current) use of opiate analgesic: Secondary | ICD-10-CM | POA: Diagnosis not present

## 2017-04-04 DIAGNOSIS — K225 Diverticulum of esophagus, acquired: Secondary | ICD-10-CM | POA: Diagnosis present

## 2017-04-04 DIAGNOSIS — J9382 Other air leak: Secondary | ICD-10-CM | POA: Diagnosis not present

## 2017-04-04 DIAGNOSIS — Z9011 Acquired absence of right breast and nipple: Secondary | ICD-10-CM | POA: Diagnosis not present

## 2017-04-04 DIAGNOSIS — I2510 Atherosclerotic heart disease of native coronary artery without angina pectoris: Secondary | ICD-10-CM | POA: Diagnosis present

## 2017-04-04 DIAGNOSIS — Z87891 Personal history of nicotine dependence: Secondary | ICD-10-CM | POA: Diagnosis not present

## 2017-04-04 DIAGNOSIS — I4891 Unspecified atrial fibrillation: Secondary | ICD-10-CM | POA: Diagnosis present

## 2017-04-04 DIAGNOSIS — Z881 Allergy status to other antibiotic agents status: Secondary | ICD-10-CM | POA: Diagnosis not present

## 2017-04-04 DIAGNOSIS — K228 Other specified diseases of esophagus: Secondary | ICD-10-CM | POA: Diagnosis present

## 2017-04-04 DIAGNOSIS — Z794 Long term (current) use of insulin: Secondary | ICD-10-CM | POA: Diagnosis not present

## 2017-04-04 DIAGNOSIS — I10 Essential (primary) hypertension: Secondary | ICD-10-CM | POA: Diagnosis present

## 2017-04-04 DIAGNOSIS — R1314 Dysphagia, pharyngoesophageal phase: Secondary | ICD-10-CM | POA: Diagnosis present

## 2017-04-04 DIAGNOSIS — E785 Hyperlipidemia, unspecified: Secondary | ICD-10-CM | POA: Diagnosis present

## 2017-04-04 DIAGNOSIS — E119 Type 2 diabetes mellitus without complications: Secondary | ICD-10-CM | POA: Diagnosis present

## 2017-04-13 DIAGNOSIS — Z5189 Encounter for other specified aftercare: Secondary | ICD-10-CM | POA: Diagnosis not present

## 2017-04-13 DIAGNOSIS — K228 Other specified diseases of esophagus: Secondary | ICD-10-CM | POA: Diagnosis not present

## 2017-04-27 DIAGNOSIS — Z931 Gastrostomy status: Secondary | ICD-10-CM | POA: Diagnosis not present

## 2017-04-27 DIAGNOSIS — K228 Other specified diseases of esophagus: Secondary | ICD-10-CM | POA: Diagnosis not present

## 2017-05-07 NOTE — Addendum Note (Signed)
Addendum  created 05/07/17 1109 by Roberts Gaudy, MD   Sign clinical note

## 2017-05-11 DIAGNOSIS — I2510 Atherosclerotic heart disease of native coronary artery without angina pectoris: Secondary | ICD-10-CM | POA: Diagnosis not present

## 2017-05-11 DIAGNOSIS — K228 Other specified diseases of esophagus: Secondary | ICD-10-CM | POA: Diagnosis not present

## 2017-05-11 DIAGNOSIS — E1165 Type 2 diabetes mellitus with hyperglycemia: Secondary | ICD-10-CM | POA: Diagnosis not present

## 2017-05-11 DIAGNOSIS — Z931 Gastrostomy status: Secondary | ICD-10-CM | POA: Diagnosis not present

## 2017-05-11 DIAGNOSIS — Z1211 Encounter for screening for malignant neoplasm of colon: Secondary | ICD-10-CM | POA: Diagnosis not present

## 2017-05-11 DIAGNOSIS — Z87891 Personal history of nicotine dependence: Secondary | ICD-10-CM | POA: Diagnosis not present

## 2017-05-20 ENCOUNTER — Other Ambulatory Visit: Payer: Self-pay | Admitting: Cardiology

## 2017-05-20 DIAGNOSIS — R1310 Dysphagia, unspecified: Secondary | ICD-10-CM | POA: Diagnosis not present

## 2017-05-20 DIAGNOSIS — K219 Gastro-esophageal reflux disease without esophagitis: Secondary | ICD-10-CM | POA: Diagnosis not present

## 2017-05-20 DIAGNOSIS — K225 Diverticulum of esophagus, acquired: Secondary | ICD-10-CM | POA: Diagnosis not present

## 2017-05-20 DIAGNOSIS — K228 Other specified diseases of esophagus: Secondary | ICD-10-CM | POA: Diagnosis not present

## 2017-05-20 DIAGNOSIS — Z931 Gastrostomy status: Secondary | ICD-10-CM | POA: Diagnosis not present

## 2017-06-01 DIAGNOSIS — L0211 Cutaneous abscess of neck: Secondary | ICD-10-CM | POA: Diagnosis not present

## 2017-06-01 DIAGNOSIS — Z87891 Personal history of nicotine dependence: Secondary | ICD-10-CM | POA: Diagnosis not present

## 2017-06-01 DIAGNOSIS — L929 Granulomatous disorder of the skin and subcutaneous tissue, unspecified: Secondary | ICD-10-CM | POA: Diagnosis not present

## 2017-06-01 DIAGNOSIS — K228 Other specified diseases of esophagus: Secondary | ICD-10-CM | POA: Diagnosis not present

## 2017-06-01 DIAGNOSIS — R238 Other skin changes: Secondary | ICD-10-CM | POA: Diagnosis not present

## 2017-06-01 DIAGNOSIS — L03311 Cellulitis of abdominal wall: Secondary | ICD-10-CM | POA: Diagnosis not present

## 2017-06-02 DIAGNOSIS — R221 Localized swelling, mass and lump, neck: Secondary | ICD-10-CM | POA: Diagnosis not present

## 2017-06-02 DIAGNOSIS — Z8719 Personal history of other diseases of the digestive system: Secondary | ICD-10-CM | POA: Diagnosis not present

## 2017-06-02 DIAGNOSIS — K228 Other specified diseases of esophagus: Secondary | ICD-10-CM | POA: Diagnosis not present

## 2017-06-02 DIAGNOSIS — Z9889 Other specified postprocedural states: Secondary | ICD-10-CM | POA: Diagnosis not present

## 2017-06-04 DIAGNOSIS — Z931 Gastrostomy status: Secondary | ICD-10-CM | POA: Diagnosis not present

## 2017-06-04 DIAGNOSIS — K228 Other specified diseases of esophagus: Secondary | ICD-10-CM | POA: Diagnosis not present

## 2017-06-04 DIAGNOSIS — L0211 Cutaneous abscess of neck: Secondary | ICD-10-CM | POA: Diagnosis not present

## 2017-06-05 ENCOUNTER — Emergency Department (HOSPITAL_COMMUNITY)
Admission: EM | Admit: 2017-06-05 | Discharge: 2017-06-05 | Disposition: A | Discharge status: Home / Self Care | Payer: Medicare Other | Attending: Emergency Medicine | Admitting: Emergency Medicine

## 2017-06-05 ENCOUNTER — Encounter (HOSPITAL_COMMUNITY): Payer: Self-pay

## 2017-06-05 DIAGNOSIS — Z4659 Encounter for fitting and adjustment of other gastrointestinal appliance and device: Secondary | ICD-10-CM | POA: Diagnosis not present

## 2017-06-05 DIAGNOSIS — K9423 Gastrostomy malfunction: Secondary | ICD-10-CM

## 2017-06-05 DIAGNOSIS — Z853 Personal history of malignant neoplasm of breast: Secondary | ICD-10-CM | POA: Diagnosis not present

## 2017-06-05 DIAGNOSIS — I2510 Atherosclerotic heart disease of native coronary artery without angina pectoris: Secondary | ICD-10-CM | POA: Insufficient documentation

## 2017-06-05 DIAGNOSIS — Z79899 Other long term (current) drug therapy: Secondary | ICD-10-CM | POA: Insufficient documentation

## 2017-06-05 DIAGNOSIS — Z87891 Personal history of nicotine dependence: Secondary | ICD-10-CM | POA: Diagnosis not present

## 2017-06-05 DIAGNOSIS — J45909 Unspecified asthma, uncomplicated: Secondary | ICD-10-CM | POA: Insufficient documentation

## 2017-06-05 DIAGNOSIS — Z931 Gastrostomy status: Secondary | ICD-10-CM | POA: Diagnosis not present

## 2017-06-05 DIAGNOSIS — J449 Chronic obstructive pulmonary disease, unspecified: Secondary | ICD-10-CM | POA: Diagnosis not present

## 2017-06-05 DIAGNOSIS — Z96643 Presence of artificial hip joint, bilateral: Secondary | ICD-10-CM | POA: Diagnosis not present

## 2017-06-05 DIAGNOSIS — K228 Other specified diseases of esophagus: Secondary | ICD-10-CM | POA: Diagnosis not present

## 2017-06-05 DIAGNOSIS — E119 Type 2 diabetes mellitus without complications: Secondary | ICD-10-CM | POA: Insufficient documentation

## 2017-06-05 DIAGNOSIS — I252 Old myocardial infarction: Secondary | ICD-10-CM | POA: Diagnosis not present

## 2017-06-05 DIAGNOSIS — Z7982 Long term (current) use of aspirin: Secondary | ICD-10-CM | POA: Insufficient documentation

## 2017-06-05 DIAGNOSIS — I10 Essential (primary) hypertension: Secondary | ICD-10-CM | POA: Insufficient documentation

## 2017-06-05 HISTORY — DX: Congenital diverticulum of esophagus: Q39.6

## 2017-06-05 NOTE — ED Provider Notes (Signed)
Medical screening examination/treatment/procedure(s) were conducted as a shared visit with non-physician practitioner(s) and myself.  I personally evaluated the patient during the encounter.   EKG Interpretation None      71yF. Feeding tube dislodged. 65F. Actually has apt at 1p today for replacement and possible upsizing. Husband didn't want prior tube replaced because now "contaminated." Explained that replacement really isn't a sterile procedure. Since going to be replaced later anyways, we placed a 60F foley was placed as temporizing measure. Minimal bleeding with insertion. Return of gastric contents through new tube. Tolerated well.    Virgel Manifold, MD 06/05/17 [phone]

## 2017-06-05 NOTE — ED Notes (Signed)
ED Provider at bedside. 

## 2017-06-05 NOTE — Discharge Instructions (Signed)
Follow-up with her GI doctor today. Caution when removing your shirt or when moving around to avoid further displacement. Return to the emergency room if you develop fever, chills, worsening pain, displacement of the tube or any new or worsening symptoms.

## 2017-06-05 NOTE — ED Provider Notes (Signed)
[facility] DEPT Provider Note   CSN: [id number] Arrival date & time: 06/05/17  0831     History   Chief Complaint Chief Complaint  Patient presents with  . feeding tube out    HPI [name] is a 71 y.o. female presenting with displacement of her G-tube.  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 It was removed in July, when the zenker diverticulum was assumed to be healed. 3 days later, G-tube was replaced, 11F. She has had this G-tube in since then. She has an appointment at 1:00 with her doctor at [facility] to replace and possibly upsize the G-tube. She reports she has been having a lot of difficulty with this g-tube, including drainage, infection, and leaking. She reports the signs of infection and drainage have decreased since she has been on Keflex. This morning, she was removing her shirt when the G-tube fell out. She is in the emergency room to get something in the track to keep it open and prevent leaking until she can see her doctor this afternoon. G-tube fell out around 7:30 this morning. She denies other symptoms. She denies fever, chills, chest pain, shortness of breath, nausea, vomiting, abdominal pain, urinary symptoms, or abnormal bowel movements.  HPI  Past Medical History:  Diagnosis Date  . Asthma   . Breast cancer ([facility])    right breast  . COPD (chronic obstructive pulmonary disease) ([place])   . Coronary artery disease   . Diverticulum of esophagus   . Elevated LFTs   . Emphysema lung ([place])   . ETOH abuse   . H/O atrial fibrillation without current medication    only one time when she had sepsis  . Hyperlipidemia   . Hypertension    hx of but not on any medications  . Myocardial infarction ([place]) 2000  . OA (osteoarthritis) of knee   . Osteoarthritis   . Tobacco abuse     Patient Active Problem List   Diagnosis Date Noted  . Tobacco consumption 02/12/2016  . Hoarseness of voice 01/07/2016  . S/P right TH  revision 10/22/2015  . 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 Infected wound, initial encounter 06/20/2015  . Cholecystitis   . Esophageal fistula   . Abscess   . Discitis   . HCAP (healthcare-associated pneumonia)   . 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[place]) 04/28/2015  . Abscess of neck   . Esophageal perforation   . Loss of weight   . Esophageal dysphagia   . 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 Hip dislocation, right ([place]) 04/26/2015  . Healthcare-associated pneumonia 04/26/2015  . Cholecystitis, acute 04/26/2015  . 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[place]) 04/26/2015  . PAF (paroxysmal atrial fibrillation) ([place]) 09/20/2014  . Elevated troponin 09/20/2014  . Acute renal failure syndrome ([place])   . Malnutrition of moderate degree ([place]) 08/29/2014  . Pneumonia   . Respiratory failure ([place])   . UTI (urinary tract infection) 08/27/2014  . Sepsis ([place]) 08/27/2014  . Atrial fibrillation with RVR ([place]) 08/27/2014  . Shock circulatory ([place]) 08/27/2014  . Acute kidney injury ([place]) 08/27/2014  . Encephalopathy acute 08/27/2014  . Hypokalemia 08/26/2014  . Hyponatremia 08/26/2014  . Acute renal failure ([place]) 08/26/2014  . Weakness 08/26/2014  . ETOH abuse 08/07/2014  . Edema extremities 07/12/2014  . Atelectasis 08/16/2011  . Diabetes mellitus type 2, insulin dependent ([place])   . COPD (chronic obstructive pulmonary disease) ([place])   . Tobacco  dependence   . Hyperlipidemia   . Essential hypertension   . Coronary artery disease     Past Surgical History:  Procedure Laterality Date  . ANTERIOR HIP REVISION Right 10/22/2015   Procedure: RIGHT  HIP REVISION;  Surgeon: Paralee Cancel, MD;  Location: [facility];  Service: Orthopedics;  Laterality: Right;  . APPLICATION OF WOUND VAC N/A 06/20/2015   Procedure: APPLICATION OF INCISIONAL WOUND VAC;  Surgeon: Melina Schools, MD;  Location: [facility];  Service: Orthopedics;  Laterality: N/A;  . BREAST SURGERY  1991   right mastectomy  .  CARDIAC CATHETERIZATION  04/05/2009   EF 60%  . CARDIOVASCULAR STRESS TEST  01/31/2005   EF 58%  . CESAREAN SECTION  '78, '80, '81   x 3  . CORONARY ANGIOPLASTY  08/1998   x2 OF A BIFURCATION OM-1, OM-2 LESION  . CORONARY ANGIOPLASTY WITH STENT PLACEMENT  01/1999   MID FIRST OBTUSE MARGINAL VESSEL  . CORONARY ANGIOPLASTY WITH STENT PLACEMENT  07/1999   STENTING AT THE CRUX OF THE RIGHT CORONARY ARTERY WITH A 3.8MM X 18MM TETRA STENT  . DIRECT LARYNGOSCOPY N/A 05/03/2015   Procedure: DIRECT LARYNGOSCOPY;  Surgeon: Jodi Marble, MD;  Location: [facility];  Service: ENT;  Laterality: N/A;  . EYE SURGERY  05/18/2014,06/01/2014   BILATERAL CATARACT S WITH LENS IMPLANTS  . GASTROSTOMY N/A 05/04/2015   Procedure: OPEN GASTROSTOMY WITH TUBE PLACEMENT;  Surgeon: Donnie Mesa, MD;  Location: [facility];  Service: General;  Laterality: N/A;  . GASTROSTOMY N/A 11/13/2016   Procedure: INSERTION OF GASTROSTOMY TUBE;  Surgeon: Coralie Keens, MD;  Location: [facility];  Service: General;  Laterality: N/A;  . HARDWARE REMOVAL N/A 05/03/2015   Procedure: HARDWARE REMOVAL;  Surgeon: Melina Schools, MD;  Location: [facility];  Service: Orthopedics;  Laterality: N/A;  . HIP CLOSED REDUCTION Right 04/26/2015   Procedure: CLOSED REDUCTION HIP;  Surgeon: Melina Schools, MD;  [facility];  Service: Orthopedics;  Laterality: Right;  . HYSTEROSCOPY     D & C  . INCISION AND DRAINAGE ABSCESS N/A 05/03/2015   Procedure: INCISION AND DRAINAGE CERVICAL  ABSCESS AND REMOVAL OF HARDWARE;  Surgeon: Melina Schools, MD;  Location: [facility];  Service: Orthopedics;  Laterality: N/A;  . JOINT REPLACEMENT  08/2011   bilateral hip  . JOINT REPLACEMENT  01/2012   right hip  . MASTECTOMY    . neck fusion  2011  . PELVIC LAPAROSCOPY  2002   RSO-    . RADICAL NECK DISSECTION N/A 11/08/2016   Procedure: INCISION AND DRAINAGE OF NECK ABSCESS;  Surgeon: Jerrell Belfast, MD;  Location: [facility];  Service: ENT;  Laterality: N/A;  . RADIOLOGY WITH ANESTHESIA  Right 06/28/2015   Procedure: MRI OF CERVICAL SPINE  AND RIGHT HIP  WITH AND WITHOUT CONTRAST    (RADIOLOGY WITH ANESTHESIA);  Surgeon: Medication Radiologist, MD;  Location: [facility];  Service: Radiology;  Laterality: Right;  . REMOVAL OF GASTROSTOMY TUBE N/A 11/14/2016   Procedure: REMOVAL OF GASTROSTOMY TUBE W/ REPLACEMENT OF GASTROSTOMY TUBE;  Surgeon: Coralie Keens, MD;  Location: [facility];  Service: General;  Laterality: N/A;  . RIGID ESOPHAGOSCOPY N/A 05/03/2015   Procedure: RIGID ESOPHAGOSCOPY;  Surgeon: Jodi Marble, MD;  Location: [facility];  Service: ENT;  Laterality: N/A;  . TONSILLECTOMY AND ADENOIDECTOMY    . TOTAL HIP ARTHROPLASTY  08/2010   bilat  . VULVECTOMY  1981   partial    OB History    No data available  Home Medications    Prior to Admission medications   Medication Sig Start Date End Date Taking? Authorizing Provider  albuterol (PROVENTIL HFA;VENTOLIN HFA) 108 (90 BASE) MCG/ACT inhaler Inhale 2 puffs into the lungs every 6 (six) hours as needed for wheezing.     [provider]  ALPRAZolam Duanne Moron) 0.25 MG tablet Take one tablet daily or as needed Patient taking differently: Take 0.25 mg by mouth daily as needed for anxiety.  05/14/11   Martinique, Peter M, MD  aspirin EC 81 MG tablet Take 1 tablet (81 mg total) by mouth daily. Start on POD #2 11/16/16   Melina Schools, MD  aspirin EC 81 MG tablet Take 1 tablet (81 mg total) by mouth daily. Start on POD #2 11/16/16   Melina Schools, MD  BIOFREEZE 4 % GEL APPLY TO AFFECTED AREA AS NEEDED 10/28/16   [provider]  budesonide-formoterol (SYMBICORT) 160-4.5 MCG/ACT inhaler Inhale 2 puffs into the lungs 2 (two) times daily as needed (Wheezing). Reported on 10/16/2015    [provider]  cephALEXin (KEFLEX) 500 MG capsule Take 1 capsule (500 mg total) by mouth 4 (four) times daily. 02/26/17   Carlyle Basques, MD  chlorhexidine (PERIDEX) 0.12 % solution Use as directed 5 mLs in the mouth or throat 4 (four)  times daily. 11/16/16   Melina Schools, MD  diclofenac (VOLTAREN) 75 MG EC tablet  11/01/16   [provider]  furosemide (LASIX) 40 MG tablet TAKE 1 TABLET BY MOUTH TWICE A DAY 05/20/17   Martinique, Peter M, MD  gabapentin (NEURONTIN) 600 MG tablet Take 600 mg by mouth 4 (four) times daily.     [provider]  losartan (COZAAR) 25 MG tablet Take 25 mg by mouth daily.  04/04/15   [provider]  MEROPENEM IV Inject into the vein.    [provider]  methocarbamol (ROBAXIN) 500 MG tablet TAKE 1 TABLET BY MOUTH EVERY 8 HOURS FOR SPASM 10/30/16   [provider]  morphine (MS CONTIN) 30 MG 12 hr tablet  12/23/16   [provider]  mupirocin ointment (BACTROBAN) 2 % Apply 1 application topically 3 (three) times daily. 05/29/17   [provider]  oxyCODONE (ROXICODONE) 15 MG immediate release tablet  12/03/16   [provider]  Probiotic Product (PROBIOTIC PO) Take 1 capsule by mouth every other day.    [provider]  varenicline (CHANTIX STARTING MONTH PAK) 0.5 MG X 11 & 1 MG X 42 tablet AS DIRECTED AS DIRECTED ORALLY 30 DAYS 11/21/16   [provider]    Family History Family History  Problem Relation Age of Onset  . Diabetes Mother   . Hypertension Father   . Heart disease Father   . Heart attack Father   . Stroke Father     Social History Social History  Substance Use Topics  . Smoking status: Former Smoker    Packs/day: 1.00    Years: 50.00    Types: Cigarettes  . Smokeless tobacco: Never Used     Comment: using chantix  . Alcohol use No     Comment: former alcohol abuse     Allergies   Penicillins and Zithromax [azithromycin dihydrate]   Review of Systems Review of Systems  Constitutional: Negative for chills and fever.  Respiratory: Negative for cough, chest tightness and shortness of breath.   Cardiovascular: Negative for chest pain.  Gastrointestinal: Negative for blood in stool,  constipation, diarrhea, nausea and vomiting.  G-tube displacement     Physical Exam Updated Vital Signs BP (!) 143/86 (BP Location: Left Arm)   Pulse 61   Temp 97.8 F (36.6 C) (Oral)   Resp 20   Ht 5\' 2"  (1.575 m)   Wt 60.1 kg (132 lb 8 oz)   SpO2 98%   BMI 24.23 kg/m   Physical Exam  Constitutional: She is oriented to person, place, and time. She appears well-developed and well-nourished. No distress.  HENT:  Head: Normocephalic and atraumatic.  Eyes: EOM are normal.  Neck: Normal range of motion.  Cardiovascular: Normal rate, regular rhythm and intact distal pulses.   Pulmonary/Chest: Effort normal and breath sounds normal. No respiratory distress. She has no wheezes.  Abdominal: Soft. Normal appearance and bowel sounds are normal. She exhibits no distension.    Minimal surrounding erythema from G-tube insertion site. No active drainage. No active bleeding or gastric contents leaking. No tenderness elsewhere in the abdomen.  Musculoskeletal: Normal range of motion.  Neurological: She is alert and oriented to person, place, and time.  Skin: Skin is warm. No rash noted.  Psychiatric: She has a normal mood and affect.  Nursing note and vitals reviewed.    ED Treatments / Results  Labs (all labs ordered are listed, but only abnormal results are displayed) Labs Reviewed - No data to display  EKG  EKG Interpretation None       Radiology No results found.  Procedures Gastrostomy tube replacement Date/Time: 06/05/2017 12:54 PM Performed by: Franchot Heidelberg Authorized by: Franchot Heidelberg  Consent: Verbal consent obtained. Consent given by: patient Patient tolerance: Patient tolerated the procedure well with no immediate complications Comments: 90W foley placed in G-tube insertion site. Return of gastric contents. Minimal bleeding with insertion.     (including critical care time)  Medications Ordered in ED Medications - No data to  display   Initial Impression / Assessment and Plan / ED Course  I have reviewed the triage vital signs and the nursing notes.  Pertinent labs & imaging results that were available during my care of the patient were reviewed by me and considered in my medical decision making (see chart for details).     Patient presenting for G-tube displacement. Uses 17 [place]. Has appointment with her doctor at [facility] for G-tube replacement this afternoon. No other complaints at this time. Will replace with Foley until patient can be seen at [facility]. Discussed case with attending, Dr. Wilson Singer evaluated the patient. 53 French Foley insertion successful. Gastric contents returned to tube placement. Minimal bleeding with placement. Will clamp Foley, and have patient follow-up with Dr. Viona [facility]. Patient presented for discharge. Strict return precautions given. Patient states she understands and agrees to plan.  Final Clinical Impressions(s) / ED Diagnoses   Final diagnoses:  PEG tube malfunction [facility])    New Prescriptions Discharge Medication List as of 06/05/2017 11:08 AM       Franchot Heidelberg, PA-C 06/05/17 1254    Virgel Manifold, MD 06/06/17 [phone]

## 2017-06-05 NOTE — ED Triage Notes (Signed)
Patient states after she got her feeding this AM her feeding tube fell out. Patient states the feeding tube has been leaking and had an appointment at [facility] to have the tube checked today, but fell out this AM.

## 2017-06-16 ENCOUNTER — Ambulatory Visit (INDEPENDENT_AMBULATORY_CARE_PROVIDER_SITE_OTHER): Payer: Medicare Other | Admitting: Internal Medicine

## 2017-06-16 ENCOUNTER — Encounter: Payer: Self-pay | Admitting: Internal Medicine

## 2017-06-16 VITALS — BP 124/73 | HR 65 | Temp 98.3°F | Ht 62.0 in | Wt 133.0 lb

## 2017-06-16 DIAGNOSIS — Z23 Encounter for immunization: Secondary | ICD-10-CM

## 2017-06-16 DIAGNOSIS — Q396 Congenital diverticulum of esophagus: Secondary | ICD-10-CM

## 2017-06-16 MED ORDER — AMOXICILLIN-POT CLAVULANATE 600-42.9 MG/5ML PO SUSR: 875.0000 mg | Freq: Two times a day (BID) | ORAL | 11 refills | Status: DC

## 2017-06-16 NOTE — Progress Notes (Signed)
RFV: esophageal fistula Patient ID: [name], female   DOB: 1946/02/01, 71 y.o.   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 In may she underwent surgery by dr Vicie Mutters at [facility] [place] who has had serial esophagram for evaluation of recurrent fistula. In mid September she noticed that the left side of neck started to swell on 9/20. She had repeat imaging and re-iniation of abtx cephalexin due to having recurrnet fistula from zenker diverticulum. Read shows complex fistulous tracts extending from the priro site of zenker's diverticulum repair. She and her treatment team are puzzled and dismayed by the recurrence. Afebrile. Continues to get nutrition through tube feeds through [facility].  I have reviewed records from chart   Outpatient Encounter Prescriptions as of 06/16/2017  Medication Sig  . albuterol (PROVENTIL HFA;VENTOLIN HFA) 108 (90 BASE) MCG/ACT inhaler Inhale 2 puffs into the lungs every 6 (six) hours as needed for wheezing.   Marland Kitchen ALPRAZolam (XANAX) 0.25 MG tablet Take one tablet daily or as needed (Patient taking differently: Take 0.25 mg by mouth daily as needed for anxiety. )  . aspirin EC 81 MG tablet Take 1 tablet (81 mg total) by mouth daily. Start on POD #2  . BIOFREEZE 4 % GEL APPLY TO AFFECTED AREA AS NEEDED  . budesonide-formoterol (SYMBICORT) 160-4.5 MCG/ACT inhaler Inhale 2 puffs into the lungs 2 (two) times daily as needed (Wheezing). Reported on 10/16/2015  . cephALEXin (KEFLEX) 500 MG capsule Take 1 capsule (500 mg total) by mouth 4 (four) times daily.  . diclofenac (VOLTAREN) 75 MG EC tablet   . furosemide (LASIX) 40 MG tablet TAKE 1 TABLET BY MOUTH TWICE A DAY  . gabapentin (NEURONTIN) 600 MG tablet Take 600 mg by mouth 4 (four) times daily.   Marland Kitchen losartan (COZAAR) 25 MG tablet Take 25 mg by mouth daily.   . methocarbamol (ROBAXIN) 500 MG tablet TAKE 1 TABLET BY MOUTH  EVERY 8 HOURS FOR SPASM  . morphine (MS CONTIN) 30 MG 12 hr tablet   . mupirocin ointment (BACTROBAN) 2 % Apply 1 application topically 3 (three) times daily.  Marland Kitchen oxyCODONE (ROXICODONE) 15 MG immediate release tablet   . Probiotic Product (PROBIOTIC PO) Take 1 capsule by mouth every other day.  . chlorhexidine (PERIDEX) 0.12 % solution Use as directed 5 mLs in the mouth or throat 4 (four) times daily. (Patient not taking: Reported on 06/16/2017)  . MEROPENEM IV Inject into the vein.  . [DISCONTINUED] aspirin EC 81 MG tablet Take 1 tablet (81 mg total) by mouth daily. Start on POD #2  . [DISCONTINUED] varenicline (CHANTIX STARTING MONTH PAK) 0.5 MG X 11 & 1 MG X 42 tablet AS DIRECTED AS DIRECTED ORALLY 30 DAYS   No facility-administered encounter medications on file as of 06/16/2017.      Patient Active Problem List   Diagnosis Date Noted  . Tobacco consumption 02/12/2016  . Hoarseness of voice 01/07/2016  . 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 Infected wound, initial encounter 06/20/2015  . Cholecystitis   . Esophageal fistula   . Abscess   . Discitis   . HCAP (healthcare-associated pneumonia)   . 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[place]) 04/28/2015  . Abscess of neck   . Esophageal perforation   . Loss of weight   . Esophageal dysphagia   . Recurrent dislocation of hip  joint prosthesis ([place]) 04/26/2015  . Hip dislocation, right ([place]) 04/26/2015  . Healthcare-associated pneumonia 04/26/2015  . Cholecystitis, acute 04/26/2015  . 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[place]) 04/26/2015  . PAF (paroxysmal atrial fibrillation) ([place]) 09/20/2014  . Elevated troponin 09/20/2014  . Acute renal failure syndrome ([place])   . Malnutrition of moderate degree ([place]) 08/29/2014  . Pneumonia   . Respiratory failure ([place])   . UTI (urinary tract infection) 08/27/2014  . Sepsis ([place]) 08/27/2014  . Atrial fibrillation with RVR ([place]) 08/27/2014  . Shock circulatory ([place]) 08/27/2014    . Acute kidney injury ([place]) 08/27/2014  . Encephalopathy acute 08/27/2014  . Hypokalemia 08/26/2014  . Hyponatremia 08/26/2014  . Acute renal failure ([place]) 08/26/2014  . Weakness 08/26/2014  . ETOH abuse 08/07/2014  . Edema extremities 07/12/2014  . Atelectasis 08/16/2011  . Diabetes mellitus type 2, insulin dependent ([place])   . COPD (chronic obstructive pulmonary disease) ([place])   . Tobacco dependence   . Hyperlipidemia   . Essential hypertension   . Coronary artery disease      Health Maintenance Due  Topic Date Due  . Hepatitis C Screening  Dec 03, 1945  . FOOT EXAM  02/09/1958  . OPHTHALMOLOGY EXAM  02/09/1958  . COLONOSCOPY  02/09/1998  . DEXA SCAN  02/09/2013  . PNA vac Low Risk Adult (2 of 2 - PPSV23) 02/09/2013  . MAMMOGRAM  10/10/2016  . INFLUENZA VACCINE  04/15/2017  . HEMOGLOBIN A1C  05/10/2017     Review of Systems Per hpi, no recent neck swelling. No fever, chills, nigthsweats. 12 point ros is otherwise negative Physical Exam   BP 124/73   Pulse 65   Temp 98.3 F (36.8 C) (Oral)   Ht 5\' 2"  (1.575 m)   Wt 133 lb (60.3 kg)   BMI 24.33 kg/m   Physical Exam  Constitutional:  oriented to person, place, and time. appears well-developed and well-nourished. No distress.  HENT: [place]/AT, PERRLA, no scleral icterus Mouth/Throat: Oropharynx is clear and moist. No oropharyngeal exudate.  Cardiovascular: Normal rate, regular rhythm and normal heart sounds. Exam reveals no gallop and no friction rub.  No murmur heard.  Pulmonary/Chest: Effort normal and breath sounds normal. No respiratory distress.  has no wheezes.  Neck = supple, no nuchal rigidity, surgical incision well heale Lymphadenopathy: no cervical adenopathy. No axillary adenopathy Neurological: alert and oriented to person, place, and time.  Skin: Skin is warm and dry. No rash noted. No erythema.  Psychiatric: a normal mood and affect.  behavior is normal.   CBC Lab Results  Component Value Date   WBC  8.1 11/11/2016   RBC 4.38 11/11/2016   HGB 12.6 11/11/2016   HCT 36.9 11/11/2016   PLT 286 11/11/2016   MCV 84.2 11/11/2016   MCH 28.8 11/11/2016   MCHC 34.1 11/11/2016   RDW 14.3 11/11/2016   LYMPHSABS 1.6 11/11/2016   MONOABS 0.8 11/11/2016   EOSABS 0.2 11/11/2016    BMET Lab Results  Component Value Date   NA 136 11/13/2016   K 4.1 11/13/2016   CL 106 11/13/2016   CO2 24 11/13/2016   GLUCOSE 116 (H) 11/13/2016   BUN 6 11/13/2016   CREATININE 0.32 (L) 11/13/2016   CALCIUM 8.4 (L) 11/13/2016   GFRNONAA >60 11/13/2016   GFRAA >60 11/13/2016      Assessment and Plan Zenker's diverticulaum with fistula = -bacteria is likely mouth flora. Would like to switch to amox/clav  Health maintenance Will give flu shot today  Hx of childhood  RFV: esophageal fistula Patient ID: [name], female   DOB: 1946/02/01, 71 y.o.   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 In may she underwent surgery by dr Vicie Mutters at [facility], [place] who has had serial esophagram for evaluation of recurrent fistula. In mid September she noticed that the left side of neck started to swell on 9/20. She had repeat imaging and re-iniation of abtx cephalexin due to having recurrnet fistula from zenker diverticulum. Read shows complex fistulous tracts extending from the priro site of zenker's diverticulum repair. She and her treatment team are puzzled and dismayed by the recurrence. Afebrile. Continues to get nutrition through tube feeds through [facility].  I have reviewed records from chart   Outpatient Encounter Prescriptions as of 06/16/2017  Medication Sig  . albuterol (PROVENTIL HFA;VENTOLIN HFA) 108 (90 BASE) MCG/ACT inhaler Inhale 2 puffs into the lungs every 6 (six) hours as needed for wheezing.   Marland Kitchen ALPRAZolam (XANAX) 0.25 MG tablet Take one tablet daily or as needed (Patient taking differently: Take 0.25 mg by mouth daily as needed for anxiety. )  . aspirin EC 81 MG tablet Take 1 tablet (81 mg total) by mouth daily. Start on POD #2  . BIOFREEZE 4 % GEL APPLY TO AFFECTED AREA AS NEEDED  . budesonide-formoterol (SYMBICORT) 160-4.5 MCG/ACT inhaler Inhale 2 puffs into the lungs 2 (two) times daily as needed (Wheezing). Reported on 10/16/2015  . cephALEXin (KEFLEX) 500 MG capsule Take 1 capsule (500 mg total) by mouth 4 (four) times daily.  . diclofenac (VOLTAREN) 75 MG EC tablet   . furosemide (LASIX) 40 MG tablet TAKE 1 TABLET BY MOUTH TWICE A DAY  . gabapentin (NEURONTIN) 600 MG tablet Take 600 mg by mouth 4 (four) times daily.   Marland Kitchen losartan (COZAAR) 25 MG tablet Take 25 mg by mouth daily.   . methocarbamol (ROBAXIN) 500 MG tablet TAKE 1 TABLET BY MOUTH  EVERY 8 HOURS FOR SPASM  . morphine (MS CONTIN) 30 MG 12 hr tablet   . mupirocin ointment (BACTROBAN) 2 % Apply 1 application topically 3 (three) times daily.  Marland Kitchen oxyCODONE (ROXICODONE) 15 MG immediate release tablet   . Probiotic Product (PROBIOTIC PO) Take 1 capsule by mouth every other day.  . chlorhexidine (PERIDEX) 0.12 % solution Use as directed 5 mLs in the mouth or throat 4 (four) times daily. (Patient not taking: Reported on 06/16/2017)  . MEROPENEM IV Inject into the vein.  . [DISCONTINUED] aspirin EC 81 MG tablet Take 1 tablet (81 mg total) by mouth daily. Start on POD #2  . [DISCONTINUED] varenicline (CHANTIX STARTING MONTH PAK) 0.5 MG X 11 & 1 MG X 42 tablet AS DIRECTED AS DIRECTED ORALLY 30 DAYS   No facility-administered encounter medications on file as of 06/16/2017.      Patient Active Problem List   Diagnosis Date Noted  . Tobacco consumption 02/12/2016  . Hoarseness of voice 01/07/2016  . 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 Infected wound, initial encounter 06/20/2015  . Cholecystitis   . Esophageal fistula   . Abscess   . Discitis   . HCAP (healthcare-associated pneumonia)   . 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[place]) 04/28/2015  . Abscess of neck   . Esophageal perforation   . Loss of weight   . Esophageal dysphagia   . Recurrent dislocation of hip

## 2017-06-17 LAB — BASIC METABOLIC PANEL
BUN: 19 mg/dL (ref 7–25)
CALCIUM: 9.3 mg/dL (ref 8.6–10.4)
CHLORIDE: 100 mmol/L (ref 98–110)
CO2: 28 mmol/L (ref 20–32)
Creat: 0.59 mg/dL (ref 0.50–0.99)
GLUCOSE: 92 mg/dL (ref 65–99)
Potassium: 4.6 mmol/L (ref 3.5–5.3)
Sodium: 137 mmol/L (ref 135–146)

## 2017-06-17 LAB — CBC WITH DIFFERENTIAL/PLATELET
Basophils Absolute: 41 cells/uL (ref 0–200)
Basophils Relative: 0.5 %
EOS ABS: 130 {cells}/uL (ref 15–500)
EOS PCT: 1.6 %
HCT: 37.9 % (ref 35.0–45.0)
HEMOGLOBIN: 12.6 g/dL (ref 11.7–15.5)
Lymphs Abs: 2082 cells/uL (ref 850–3900)
MCH: 29 pg (ref 27.0–33.0)
MCHC: 33.2 g/dL (ref 32.0–36.0)
MCV: 87.3 fL (ref 80.0–100.0)
MONOS PCT: 9.3 %
MPV: 11.2 fL (ref 7.5–12.5)
NEUTROS ABS: 5095 {cells}/uL (ref 1500–7800)
Neutrophils Relative %: 62.9 %
PLATELETS: 208 10*3/uL (ref 140–400)
RBC: 4.34 10*6/uL (ref 3.80–5.10)
RDW: 13 % (ref 11.0–15.0)
TOTAL LYMPHOCYTE: 25.7 %
WBC mixed population: 753 cells/uL (ref 200–950)
WBC: 8.1 10*3/uL (ref 3.8–10.8)

## 2017-06-17 LAB — SEDIMENTATION RATE: SED RATE: 65 mm/h — AB (ref 0–30)

## 2017-06-17 LAB — C-REACTIVE PROTEIN: CRP: 15.7 mg/L — ABNORMAL HIGH (ref <8.0)

## 2017-06-22 ENCOUNTER — Other Ambulatory Visit: Payer: Self-pay | Admitting: Pharmacist

## 2017-06-22 ENCOUNTER — Telehealth: Payer: Self-pay | Admitting: *Deleted

## 2017-06-22 ENCOUNTER — Other Ambulatory Visit: Payer: Self-pay | Admitting: *Deleted

## 2017-06-22 DIAGNOSIS — K223 Perforation of esophagus: Secondary | ICD-10-CM

## 2017-06-22 MED ORDER — AMOXICILLIN-POT CLAVULANATE 875-125 MG PO TABS: 1.0000 | ORAL_TABLET | Freq: Two times a day (BID) | ORAL | 11 refills | Status: DC

## 2017-06-22 NOTE — Telephone Encounter (Signed)
Patient called to see if her liquid antibiotic could be switched to pill form. She currently has all medication in pill form, crushes them all and administers without issue. They are mixing her pills with hot water, pushing through without issue. The liquid antibiotic is difficult to measure, is messy and difficult to administer with her other medicines. She will using the antibiotic rather than water to mix and administer all medicines, but would still prefer a crushable pill instead. Please advise. Landis Gandy, RN

## 2017-06-22 NOTE — Telephone Encounter (Signed)
Can switch to pills

## 2017-06-22 NOTE — Telephone Encounter (Signed)
Sent prescription. Thanks. 

## 2017-06-29 ENCOUNTER — Encounter: Payer: Self-pay | Admitting: Internal Medicine

## 2017-07-15 DIAGNOSIS — G894 Chronic pain syndrome: Secondary | ICD-10-CM | POA: Diagnosis not present

## 2017-07-16 DIAGNOSIS — E1165 Type 2 diabetes mellitus with hyperglycemia: Secondary | ICD-10-CM | POA: Diagnosis not present

## 2017-07-16 DIAGNOSIS — Z1231 Encounter for screening mammogram for malignant neoplasm of breast: Secondary | ICD-10-CM | POA: Diagnosis not present

## 2017-07-16 DIAGNOSIS — M4622 Osteomyelitis of vertebra, cervical region: Secondary | ICD-10-CM | POA: Diagnosis not present

## 2017-07-16 DIAGNOSIS — I2510 Atherosclerotic heart disease of native coronary artery without angina pectoris: Secondary | ICD-10-CM | POA: Diagnosis not present

## 2017-07-16 DIAGNOSIS — E46 Unspecified protein-calorie malnutrition: Secondary | ICD-10-CM | POA: Diagnosis not present

## 2017-07-16 DIAGNOSIS — K228 Other specified diseases of esophagus: Secondary | ICD-10-CM | POA: Diagnosis not present

## 2017-07-16 DIAGNOSIS — Z87891 Personal history of nicotine dependence: Secondary | ICD-10-CM | POA: Diagnosis not present

## 2017-07-16 DIAGNOSIS — J449 Chronic obstructive pulmonary disease, unspecified: Secondary | ICD-10-CM | POA: Diagnosis not present

## 2017-07-16 DIAGNOSIS — I10 Essential (primary) hypertension: Secondary | ICD-10-CM | POA: Diagnosis not present

## 2017-07-20 DIAGNOSIS — M5134 Other intervertebral disc degeneration, thoracic region: Secondary | ICD-10-CM | POA: Diagnosis not present

## 2017-07-21 ENCOUNTER — Ambulatory Visit (INDEPENDENT_AMBULATORY_CARE_PROVIDER_SITE_OTHER): Payer: Medicare Other | Admitting: Internal Medicine

## 2017-07-21 ENCOUNTER — Encounter: Payer: Self-pay | Admitting: Internal Medicine

## 2017-07-21 VITALS — BP 144/75 | HR 71 | Temp 98.6°F | Wt 135.0 lb

## 2017-07-21 DIAGNOSIS — K2289 Other specified disease of esophagus: Secondary | ICD-10-CM

## 2017-07-21 DIAGNOSIS — K228 Other specified diseases of esophagus: Secondary | ICD-10-CM

## 2017-07-24 NOTE — Progress Notes (Signed)
Patient ID: [name], female   DOB: September 07, 1946, 71 y.o.   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 In mid September she noticed that the left side of neck started to swell on 9/20. She had repeat imaging and re-iniation of abtx cephalexin due to having recurrnet fistula from zenker diverticulum. Read shows complex fistulous tracts extending from the priro site of zenker's diverticulum repair. She is to remain npo over the last 6 wk in order to see if fistula heal spontaneously while she gets nutrition through tube feeds. Afebrile. She does drink a sip of water or occasional coffee without discomfort. She is on the fence of doing further surgery. Has upcoming appt with surgeon next week. Feels better with restarting abtx?  Outpatient Encounter Medications as of 07/21/2017  Medication Sig  . albuterol (PROVENTIL HFA;VENTOLIN HFA) 108 (90 BASE) MCG/ACT inhaler Inhale 2 puffs into the lungs every 6 (six) hours as needed for wheezing.   Marland Kitchen ALPRAZolam (XANAX) 0.25 MG tablet Take one tablet daily or as needed (Patient taking differently: Take 0.25 mg by mouth daily as needed for anxiety. )  . amoxicillin-clavulanate (AUGMENTIN) 875-125 MG tablet Take 1 tablet by mouth 2 (two) times daily.  Marland Kitchen aspirin EC 81 MG tablet Take 1 tablet (81 mg total) by mouth daily. Start on POD #2  . BIOFREEZE 4 % GEL APPLY TO AFFECTED AREA AS NEEDED  . budesonide-formoterol (SYMBICORT) 160-4.5 MCG/ACT inhaler Inhale 2 puffs into the lungs 2 (two) times daily as needed (Wheezing). Reported on 10/16/2015  . cephALEXin (KEFLEX) 500 MG capsule Take 1 capsule (500 mg total) by mouth 4 (four) times daily.  . chlorhexidine (PERIDEX) 0.12 % solution Use as directed 5 mLs in the mouth or throat 4 (four) times daily.  .  diclofenac (VOLTAREN) 75 MG EC tablet   . furosemide (LASIX) 40 MG tablet TAKE 1 TABLET BY MOUTH TWICE A DAY  . gabapentin (NEURONTIN) 600 MG tablet Take 600 mg by mouth 4 (four) times daily.   Marland Kitchen losartan (COZAAR) 25 MG tablet Take 25 mg by mouth daily.   . methocarbamol (ROBAXIN) 500 MG tablet TAKE 1 TABLET BY MOUTH EVERY 8 HOURS FOR SPASM  . morphine (MS CONTIN) 30 MG 12 hr tablet   . mupirocin ointment (BACTROBAN) 2 % Apply 1 application topically 3 (three) times daily.  Marland Kitchen oxyCODONE (ROXICODONE) 15 MG immediate release tablet   . Probiotic Product (PROBIOTIC PO) Take 1 capsule by mouth every other day.   No facility-administered encounter medications on file as of 07/21/2017.      Patient Active Problem List   Diagnosis Date Noted  . Tobacco consumption 02/12/2016  . Hoarseness of voice 01/07/2016  . 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 Infected wound, initial encounter 06/20/2015  . Cholecystitis   . Esophageal fistula   . Abscess   . Discitis   . HCAP (healthcare-associated pneumonia)   . 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HCC) 04/28/2015  . Abscess of neck   . Esophageal perforation   . Loss of weight   . Esophageal dysphagia   . 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 Hip dislocation, right (HCC) 04/26/2015  . Healthcare-associated pneumonia 04/26/2015  . Cholecystitis, acute 04/26/2015  .  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HCC) 04/26/2015  . PAF (paroxysmal atrial fibrillation) (HCC) 09/20/2014  . Elevated troponin 09/20/2014  . Acute renal failure syndrome (HCC)   . Malnutrition of moderate degree (HCC) 08/29/2014  . Pneumonia   . Respiratory failure (HCC)   . UTI (urinary tract infection) 08/27/2014  . Sepsis (HCC) 08/27/2014  . Atrial fibrillation with RVR (HCC) 08/27/2014  . Shock circulatory (HCC) 08/27/2014  . Acute kidney injury (HCC) 08/27/2014  . Encephalopathy acute 08/27/2014  . Hypokalemia 08/26/2014    . Hyponatremia 08/26/2014  . Acute renal failure (HCC) 08/26/2014  . Weakness 08/26/2014  . ETOH abuse 08/07/2014  . Edema extremities 07/12/2014  . Atelectasis 08/16/2011  . Diabetes mellitus type 2, insulin dependent (HCC)   . COPD (chronic obstructive pulmonary disease) (HCC)   . Tobacco dependence   . Hyperlipidemia   . Essential hypertension   . Coronary artery disease      Health Maintenance Due  Topic Date Due  . Hepatitis C Screening  09-Feb-1946  . FOOT EXAM  02/09/1958  . OPHTHALMOLOGY EXAM  02/09/1958  . COLONOSCOPY  02/09/1998  . DEXA SCAN  02/09/2013  . PNA vac Low Risk Adult (2 of 2 - PPSV23) 02/09/2013  . MAMMOGRAM  10/10/2016  . HEMOGLOBIN A1C  05/10/2017     Review of Systems Review of Systems  Constitutional: Negative for fever, chills, diaphoresis, activity change, appetite change, fatigue and unexpected weight change.  HENT: Negative for congestion, sore throat, rhinorrhea, sneezing, trouble swallowing and sinus pressure.  Eyes: Negative for photophobia and visual disturbance.  Respiratory: Negative for cough, chest tightness, shortness of breath, wheezing and stridor.  Cardiovascular: Negative for chest pain, palpitations and leg swelling.  Gastrointestinal: Negative for nausea, vomiting, abdominal pain, diarrhea, constipation, blood in stool, abdominal distention and anal bleeding.  Genitourinary: Negative for dysuria, hematuria, flank pain and difficulty urinating.  Musculoskeletal: Negative for myalgias, back pain, joint swelling, arthralgias and gait problem.  Skin: Negative for color change, pallor, rash and wound.  Neurological: Negative for dizziness, tremors, weakness and light-headedness.  Hematological: Negative for adenopathy. Does not bruise/bleed easily.  Psychiatric/Behavioral: Negative for behavioral problems, confusion, sleep disturbance, dysphoric mood, decreased concentration and agitation.    Physical Exam   BP (!) 144/75   Pulse  71   Temp 98.6 F (37 C) (Oral)   Wt 135 lb (61.2 kg)   BMI 24.69 kg/m   gen = a x o by 3 in NAD neck = mild induration, possibly scar tissue from prior surgery. No erythema. Or fluctuance. CBC Lab Results  Component Value Date   WBC 8.1 06/16/2017   RBC 4.34 06/16/2017   HGB 12.6 06/16/2017   HCT 37.9 06/16/2017   PLT 208 06/16/2017   MCV 87.3 06/16/2017   MCH 29.0 06/16/2017   MCHC 33.2 06/16/2017   RDW 13.0 06/16/2017   LYMPHSABS 2,082 06/16/2017   MONOABS 0.8 11/11/2016   EOSABS 130 06/16/2017    BMET Lab Results  Component Value Date   NA 137 06/16/2017   K 4.6 06/16/2017   CL 100 06/16/2017   CO2 28 06/16/2017   GLUCOSE 92 06/16/2017   BUN 19 06/16/2017   CREATININE 0.59 06/16/2017   CALCIUM 9.3 06/16/2017   GFRNONAA >60 11/13/2016   GFRAA >60 11/13/2016      Assessment and Plan  Esophageal diverticuli with fistula = continue on chronic suppression with amox/clav

## 2017-07-27 DIAGNOSIS — G894 Chronic pain syndrome: Secondary | ICD-10-CM | POA: Diagnosis not present

## 2017-07-29 DIAGNOSIS — G894 Chronic pain syndrome: Secondary | ICD-10-CM | POA: Diagnosis not present

## 2017-08-03 DIAGNOSIS — K228 Other specified diseases of esophagus: Secondary | ICD-10-CM | POA: Diagnosis not present

## 2017-08-03 DIAGNOSIS — K225 Diverticulum of esophagus, acquired: Secondary | ICD-10-CM | POA: Diagnosis not present

## 2017-08-04 ENCOUNTER — Ambulatory Visit (INDEPENDENT_AMBULATORY_CARE_PROVIDER_SITE_OTHER): Payer: Medicare Other | Admitting: Internal Medicine

## 2017-08-04 DIAGNOSIS — K228 Other specified diseases of esophagus: Secondary | ICD-10-CM

## 2017-08-04 DIAGNOSIS — K2289 Other specified disease of esophagus: Secondary | ICD-10-CM

## 2017-08-04 DIAGNOSIS — K223 Perforation of esophagus: Secondary | ICD-10-CM | POA: Diagnosis not present

## 2017-08-04 DIAGNOSIS — G894 Chronic pain syndrome: Secondary | ICD-10-CM | POA: Diagnosis not present

## 2017-08-06 NOTE — Progress Notes (Signed)
08/10/2017   PCP: Vernie Shanks, MD   Chief Complaint  Patient presents with  . Coronary Artery Disease    Primary Cardiologist:Dr. P. Martinique    HPI:  [name] is seen for follow up CAD. She has a history of coronary disease and has had multiple angioplasty procedures of the first obtuse marginal vessel. She is status post stenting of the right coronary using a 3.0 x 18 mm Tetra stent in 2000. Her last cardiac catheterization in July of 2010 showed nonobstructive disease. Her myoview study in 07/2014 was normal. She has a history of tobacco and Etoh abuse. She has chronic depression. Her last Echo 08/2014 with EF 60-65% moderate MR, PA peak pressure 39 mm Hg overall poor quality.  She was hospitalized  in Dec. 2015 for sepsis due to PNA and UTI.  During this acute episode she had rapid atrial fib and mild elevation of troponin at 1.20.  Prior to discharge she was in SR, it was felt no anticoagulation was warranted for brief episode of PAF with acute illness.  Her troponin elevation was felt to be due to demand ischemia. myoview in November 2015 was normal.   She later dislocated her hip. She was having progressive swallowing problems. She was diagnosed with cervical discitis with spinal canal stenosis. She was found to have an epidural abscess extending into the mediastinum and had an esophageal perforation. This required surgical exploration with removal of hardware. She lost a significant amount of weight with all this. In February 2017 she did undergo successful revision of her hip.   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 She had a feeding gastrostomy tube placed. She has since had recurrent abscess and recurrent fistulous tracts noted in September. This is managed with chronic antibiotics.   She does report that she quit smoking. This weekend she felt really wiped  out and fatigued and had some diarrhea on Saturday. She states she thinks this is because she was trying to do too much and she is going to stop PT for now.  She has lost 17 lbs.  She is eating some now.  She complains of some pain in her left scapula radiating down her left arm. Better with a heating pad.    Allergies  Allergen Reactions  . Zithromax [Azithromycin Dihydrate] Other (See Comments)    ORAL ULCERS     Current Outpatient Medications  Medication Sig Dispense Refill  . albuterol (PROVENTIL HFA;VENTOLIN HFA) 108 (90 BASE) MCG/ACT inhaler Inhale 2 puffs into the lungs every 6 (six) hours as needed for wheezing.     Marland Kitchen ALPRAZolam (XANAX) 0.25 MG tablet Take one tablet daily or as needed (Patient taking differently: Take 0.25 mg by mouth daily as needed for anxiety. ) 90 tablet 3  . amoxicillin-clavulanate (AUGMENTIN) 875-125 MG tablet Take 1 tablet by mouth 2 (two) times daily. 60 tablet 11  . BIOFREEZE 4 % GEL APPLY TO AFFECTED AREA AS NEEDED  0  . budesonide-formoterol (SYMBICORT) 160-4.5 MCG/ACT inhaler Inhale 2 puffs into the lungs 2 (two) times daily as needed (Wheezing). Reported on 10/16/2015    . diclofenac (VOLTAREN) 75 MG EC tablet     . furosemide (LASIX) 40 MG tablet TAKE 1 TABLET BY MOUTH TWICE A DAY 30 tablet 0  . gabapentin (NEURONTIN) 600 MG tablet Take 600 mg by mouth  4 (four) times daily.     Marland Kitchen losartan (COZAAR) 25 MG tablet Take 25 mg by mouth daily.   1  . methocarbamol (ROBAXIN) 500 MG tablet TAKE 1 TABLET BY MOUTH EVERY 8 HOURS FOR SPASM  0  . morphine (MS CONTIN) 30 MG 12 hr tablet     . Probiotic Product (PROBIOTIC PO) Take 1 capsule by mouth every other day.     No current facility-administered medications for this visit.     Past Medical History:  Diagnosis Date  . Asthma   . Breast cancer ([place])    right breast  . COPD (chronic obstructive pulmonary disease) ([place])   . Coronary artery disease   . Diverticulum of esophagus   . Elevated LFTs   .  Emphysema lung ([place])   . ETOH abuse   . H/O atrial fibrillation without current medication    only one time when she had sepsis  . Hyperlipidemia   . Hypertension    hx of but not on any medications  . Myocardial infarction ([place]) 2000  . OA (osteoarthritis) of knee   . Osteoarthritis   . Tobacco abuse     Past Surgical History:  Procedure Laterality Date  . ANTERIOR HIP REVISION Right 10/22/2015   Procedure: RIGHT  HIP REVISION;  Surgeon: Paralee Cancel, MD;  Location: [facility];  Service: Orthopedics;  Laterality: Right;  . APPLICATION OF WOUND VAC N/A 06/20/2015   Procedure: APPLICATION OF INCISIONAL WOUND VAC;  Surgeon: Melina Schools, MD;  Location: [place];  Service: Orthopedics;  Laterality: N/A;  . BREAST SURGERY  1991   right mastectomy  . CARDIAC CATHETERIZATION  04/05/2009   EF 60%  . CARDIOVASCULAR STRESS TEST  01/31/2005   EF 58%  . CESAREAN SECTION  '78, '80, '81   x 3  . CORONARY ANGIOPLASTY  08/1998   x2 OF A BIFURCATION OM-1, OM-2 LESION  . CORONARY ANGIOPLASTY WITH STENT PLACEMENT  01/1999   MID FIRST OBTUSE MARGINAL VESSEL  . CORONARY ANGIOPLASTY WITH STENT PLACEMENT  07/1999   STENTING AT THE CRUX OF THE RIGHT CORONARY ARTERY WITH A 3.8MM X 18MM TETRA STENT  . DIRECT LARYNGOSCOPY N/A 05/03/2015   Procedure: DIRECT LARYNGOSCOPY;  Surgeon: Jodi Marble, MD;  Location: [place];  Service: ENT;  Laterality: N/A;  . EYE SURGERY  05/18/2014,06/01/2014   BILATERAL CATARACT S WITH LENS IMPLANTS  . GASTROSTOMY N/A 05/04/2015   Procedure: OPEN GASTROSTOMY WITH TUBE PLACEMENT;  Surgeon: Donnie Mesa, MD;  Location: [place];  Service: General;  Laterality: N/A;  . GASTROSTOMY N/A 11/13/2016   Procedure: INSERTION OF GASTROSTOMY TUBE;  Surgeon: Coralie Keens, MD;  Location: [place];  Service: General;  Laterality: N/A;  . HARDWARE REMOVAL N/A 05/03/2015   Procedure: HARDWARE REMOVAL;  Surgeon: Melina Schools, MD;  Location: [place];  Service: Orthopedics;  Laterality: N/A;  . HIP CLOSED  REDUCTION Right 04/26/2015   Procedure: CLOSED REDUCTION HIP;  Surgeon: Melina Schools, MD;  Location: [facility];  Service: Orthopedics;  Laterality: Right;  . HYSTEROSCOPY     D & C  . INCISION AND DRAINAGE ABSCESS N/A 05/03/2015   Procedure: INCISION AND DRAINAGE CERVICAL  ABSCESS AND REMOVAL OF HARDWARE;  Surgeon: Melina Schools, MD;  Location: [place];  Service: Orthopedics;  Laterality: N/A;  . JOINT REPLACEMENT  08/2011   bilateral hip  . JOINT REPLACEMENT  01/2012   right hip  . MASTECTOMY    . neck fusion  2011  . PELVIC  LAPAROSCOPY  2002   RSO-    . RADICAL NECK DISSECTION N/A 11/08/2016   Procedure: INCISION AND DRAINAGE OF NECK ABSCESS;  Surgeon: Jerrell Belfast, MD;  Location: [place];  Service: ENT;  Laterality: N/A;  . RADIOLOGY WITH ANESTHESIA Right 06/28/2015   Procedure: MRI OF CERVICAL SPINE  AND RIGHT HIP  WITH AND WITHOUT CONTRAST    (RADIOLOGY WITH ANESTHESIA);  Surgeon: Medication Radiologist, MD;  Location: [place];  Service: Radiology;  Laterality: Right;  . REMOVAL OF GASTROSTOMY TUBE N/A 11/14/2016   Procedure: REMOVAL OF GASTROSTOMY TUBE W/ REPLACEMENT OF GASTROSTOMY TUBE;  Surgeon: Coralie Keens, MD;  Location: [place];  Service: General;  Laterality: N/A;  . RIGID ESOPHAGOSCOPY N/A 05/03/2015   Procedure: RIGID ESOPHAGOSCOPY;  Surgeon: Jodi Marble, MD;  Location: [place];  Service: ENT;  Laterality: N/A;  . TONSILLECTOMY AND ADENOIDECTOMY    . TOTAL HIP ARTHROPLASTY  08/2010   bilat  . VULVECTOMY  1981   partial    ROS:As noted in HPI. All other systems are reviewed and are negative.   Wt Readings from Last 3 Encounters:  08/10/17 137 lb (62.1 kg)  07/21/17 135 lb (61.2 kg)  06/16/17 133 lb (60.3 kg)    PHYSICAL EXAM BP (!) 150/84   Pulse 81   Ht 5\' 2"  (1.575 m)   Wt 137 lb (62.1 kg)   BMI 25.06 kg/m  GENERAL:  Well appearing WF HEENT:  PERRL, EOMI, sclera are clear. Oropharynx is clear. NECK:  No jugular venous distention, carotid upstroke brisk and  symmetric, no bruits, no thyromegaly or adenopathy LUNGS:  Clear to auscultation bilaterally CHEST:  Unremarkable HEART:  RRR,  PMI not displaced or sustained,S1 and S2 within normal limits, no S3, no S4: no clicks, no rubs, no murmurs ABD:  Soft, nontender. BS +, no masses or bruits. Gastrostomy tube in place.  EXT:  2 + pulses throughout, no edema, no cyanosis no clubbing SKIN:  Warm and dry.  No rashes NEURO:  Alert and oriented x 3. Cranial nerves II through XII intact. PSYCH:  Cognitively intact    Laboratory data:  Lab Results  Component Value Date   WBC 8.1 06/16/2017   HGB 12.6 06/16/2017   HCT 37.9 06/16/2017   PLT 208 06/16/2017   GLUCOSE 92 06/16/2017   CHOL 164 05/30/2011   TRIG 86.0 05/30/2011   HDL 57.10 05/30/2011   LDLCALC 90 05/30/2011   ALT 10 (L) 11/08/2016   AST 17 11/08/2016   NA 137 06/16/2017   K 4.6 06/16/2017   CL 100 06/16/2017   CREATININE 0.59 06/16/2017   BUN 19 06/16/2017   CO2 28 06/16/2017   TSH 2.120 08/26/2014   INR 1.07 11/06/2016   HGBA1C 5.8 (H) 11/10/2016   Dated 04/22/16: cholesterol 186, triglycerides 273, HDL 45, LDL 86.  Dated 05/11/17 A1c 6.3%.   Ecg today shows NSR with nonspecific TWA. Unchanged from prior study in 2017. I have personally reviewed and interpreted this study.  ASSESSMENT AND PLAN 1.  CAD- normal myoview in October 2015. No active angina. Ecg today is unchanged. Continue medical therapy  2. HTN well controlled  3. Paroxysmal Afib- only noted during episode of sepsis. No recurrent symptoms.   4. Tobacco abuse. Congratulated on smoking cessation.  5. 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 On chronic antibiotics. No further surgery planned.  6. COPD

## 2017-08-10 ENCOUNTER — Ambulatory Visit (INDEPENDENT_AMBULATORY_CARE_PROVIDER_SITE_OTHER): Payer: Medicare Other | Admitting: Cardiology

## 2017-08-10 ENCOUNTER — Encounter: Payer: Self-pay | Admitting: Cardiology

## 2017-08-10 VITALS — BP 150/84 | HR 81 | Ht 62.0 in | Wt 137.0 lb

## 2017-08-10 DIAGNOSIS — F17200 Nicotine dependence, unspecified, uncomplicated: Secondary | ICD-10-CM

## 2017-08-10 DIAGNOSIS — I2510 Atherosclerotic heart disease of native coronary artery without angina pectoris: Secondary | ICD-10-CM

## 2017-08-10 DIAGNOSIS — I10 Essential (primary) hypertension: Secondary | ICD-10-CM | POA: Diagnosis not present

## 2017-08-10 NOTE — Progress Notes (Signed)
RFV: esophageal diverticular fistula  Patient ID: [name], female   DOB: November 20, 1945, 71 y.o.   MRN: [id number]  HPI 71yo F with zencker's diverticulum complicated by intermittent fistula/leak. She was recently placed back on amox/clav for empiric treatment and patient feels that she does not have outpoutching our swelling to left side of neck which previously had enlarged. She had a repeat study looking for ongoing leaks which appeared improved. She had seen her ENT surgeon this week who gave her the good news and was encouraged by her healing process. He has given her the permission to start taking food by mouth, which she partially had been doing prior to this week.  Patient remains hopeful since she does not want to have surgery.  Outpatient Encounter Medications as of 08/04/2017  Medication Sig  . albuterol (PROVENTIL HFA;VENTOLIN HFA) 108 (90 BASE) MCG/ACT inhaler Inhale 2 puffs into the lungs every 6 (six) hours as needed for wheezing.   Marland Kitchen ALPRAZolam (XANAX) 0.25 MG tablet Take one tablet daily or as needed (Patient taking differently: Take 0.25 mg by mouth daily as needed for anxiety. )  . amoxicillin-clavulanate (AUGMENTIN) 875-125 MG tablet Take 1 tablet by mouth 2 (two) times daily.  Marland Kitchen BIOFREEZE 4 % GEL APPLY TO AFFECTED AREA AS NEEDED  . budesonide-formoterol (SYMBICORT) 160-4.5 MCG/ACT inhaler Inhale 2 puffs into the lungs 2 (two) times daily as needed (Wheezing). Reported on 10/16/2015  . diclofenac (VOLTAREN) 75 MG EC tablet   . furosemide (LASIX) 40 MG tablet TAKE 1 TABLET BY MOUTH TWICE A DAY  . gabapentin (NEURONTIN) 600 MG tablet Take 600 mg by mouth 4 (four) times daily.   Marland Kitchen losartan (COZAAR) 25 MG tablet Take 25 mg by mouth daily.   . methocarbamol (ROBAXIN) 500 MG tablet TAKE 1 TABLET BY MOUTH EVERY 8 HOURS FOR SPASM  . morphine (MS CONTIN) 30 MG 12 hr tablet   . Probiotic Product (PROBIOTIC PO) Take 1 capsule by mouth every other day.  . [DISCONTINUED] aspirin EC  81 MG tablet Take 1 tablet (81 mg total) by mouth daily. Start on POD #2  . [DISCONTINUED] cephALEXin (KEFLEX) 500 MG capsule Take 1 capsule (500 mg total) by mouth 4 (four) times daily.  . [DISCONTINUED] chlorhexidine (PERIDEX) 0.12 % solution Use as directed 5 mLs in the mouth or throat 4 (four) times daily.  . [DISCONTINUED] mupirocin ointment (BACTROBAN) 2 % Apply 1 application topically 3 (three) times daily.  . [DISCONTINUED] oxyCODONE (ROXICODONE) 15 MG immediate release tablet    No facility-administered encounter medications on file as of 08/04/2017.      Patient Active Problem List   Diagnosis Date Noted  . Tobacco consumption 02/12/2016  . Hoarseness of voice 01/07/2016  . 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 Infected wound, initial encounter 06/20/2015  . Cholecystitis   . Esophageal fistula   . Abscess   . Discitis   . HCAP (healthcare-associated pneumonia)   . 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HCC) 04/28/2015  . Abscess of neck   . Esophageal perforation   . Loss of weight   . Esophageal dysphagia   . 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 Hip dislocation, right (HCC) 04/26/2015  . Healthcare-associated pneumonia 04/26/2015  . Cholecystitis, acute 04/26/2015  . 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HCC) 04/26/2015  . PAF (paroxysmal atrial fibrillation) (HCC) 09/20/2014  . Elevated troponin 09/20/2014  . Acute renal failure syndrome (HCC)   . Malnutrition  of moderate degree (HCC) 08/29/2014  . Pneumonia   . Respiratory failure (HCC)   . UTI (urinary tract infection) 08/27/2014  . Sepsis (HCC) 08/27/2014  . Atrial fibrillation with RVR (HCC) 08/27/2014  . Shock circulatory (HCC) 08/27/2014  . Acute kidney injury (HCC) 08/27/2014  . Encephalopathy acute 08/27/2014  . Hypokalemia 08/26/2014  . Hyponatremia 08/26/2014  . Acute renal failure (HCC) 08/26/2014  . Weakness 08/26/2014  . ETOH abuse 08/07/2014  .  Edema extremities 07/12/2014  . Atelectasis 08/16/2011  . Diabetes mellitus type 2, insulin dependent (HCC)   . COPD (chronic obstructive pulmonary disease) (HCC)   . Tobacco dependence   . Hyperlipidemia   . Essential hypertension   . Coronary artery disease      Health Maintenance Due  Topic Date Due  . Hepatitis C Screening  Sep 29, 1945  . FOOT EXAM  02/09/1958  . OPHTHALMOLOGY EXAM  02/09/1958  . COLONOSCOPY  02/09/1998  . DEXA SCAN  02/09/2013  . PNA vac Low Risk Adult (2 of 2 - PPSV23) 02/09/2013  . MAMMOGRAM  10/10/2016  . HEMOGLOBIN A1C  05/10/2017     Review of Systems  Physical Exam   There were no vitals taken for this visit.  Gen =a xo by 3 in NAD HEENT = left side of neck has healed surgical incision. Some induration, likely scar tissue but no erythema or fluctuance in the area.  Neuro = numb to the left side of neck - unchanged  CBC Lab Results  Component Value Date   WBC 8.1 06/16/2017   RBC 4.34 06/16/2017   HGB 12.6 06/16/2017   HCT 37.9 06/16/2017   PLT 208 06/16/2017   MCV 87.3 06/16/2017   MCH 29.0 06/16/2017   MCHC 33.2 06/16/2017   RDW 13.0 06/16/2017   LYMPHSABS 2,082 06/16/2017   MONOABS 0.8 11/11/2016   EOSABS 130 06/16/2017    BMET Lab Results  Component Value Date   NA 137 06/16/2017   K 4.6 06/16/2017   CL 100 06/16/2017   CO2 28 06/16/2017   GLUCOSE 92 06/16/2017   BUN 19 06/16/2017   CREATININE 0.59 06/16/2017   CALCIUM 9.3 06/16/2017   GFRNONAA >60 11/13/2016   GFRAA >60 11/13/2016      Assessment and Plan zencker's diverticulum complicated by intermittent fistula/leak = will continue with 4 wk of chronic suppression. In the past, when we had stopped her abtx, she had redeveloped the fistula but unsure if it was due to stopping abtx. For now, would like to see how she continues to heal before stopping her amox/clav  Will check inflammatory markers at next visit

## 2017-08-10 NOTE — Addendum Note (Signed)
Addended by: Kathyrn Lass on: 08/10/2017 10:56 AM   Modules accepted: Orders

## 2017-08-10 NOTE — Patient Instructions (Signed)
Your physician wants you to follow-up in: 1 year. You will receive a reminder letter in the mail two months in advance. If you don't receive a letter, please call our office to schedule the follow-up appointment.  

## 2017-09-16 DIAGNOSIS — Z9181 History of falling: Secondary | ICD-10-CM | POA: Diagnosis not present

## 2017-09-16 DIAGNOSIS — R1310 Dysphagia, unspecified: Secondary | ICD-10-CM | POA: Diagnosis not present

## 2017-09-16 DIAGNOSIS — K228 Other specified diseases of esophagus: Secondary | ICD-10-CM | POA: Diagnosis not present

## 2017-09-16 DIAGNOSIS — K225 Diverticulum of esophagus, acquired: Secondary | ICD-10-CM | POA: Diagnosis not present

## 2017-09-20 ENCOUNTER — Other Ambulatory Visit: Payer: Self-pay

## 2017-09-20 ENCOUNTER — Emergency Department (HOSPITAL_COMMUNITY): Payer: Medicare Other

## 2017-09-20 ENCOUNTER — Inpatient Hospital Stay (HOSPITAL_COMMUNITY)
Admission: EM | Admit: 2017-09-20 | Discharge: 2017-09-22 | DRG: 871 | Disposition: A | Discharge status: Other Acute Inpatient Hospital | Payer: Medicare Other | Attending: Internal Medicine | Admitting: Internal Medicine

## 2017-09-20 ENCOUNTER — Encounter (HOSPITAL_COMMUNITY): Payer: Self-pay | Admitting: Nurse Practitioner

## 2017-09-20 ENCOUNTER — Inpatient Hospital Stay (HOSPITAL_COMMUNITY): Payer: Medicare Other

## 2017-09-20 DIAGNOSIS — R627 Adult failure to thrive: Secondary | ICD-10-CM | POA: Diagnosis present

## 2017-09-20 DIAGNOSIS — E46 Unspecified protein-calorie malnutrition: Secondary | ICD-10-CM | POA: Diagnosis present

## 2017-09-20 DIAGNOSIS — Z8249 Family history of ischemic heart disease and other diseases of the circulatory system: Secondary | ICD-10-CM

## 2017-09-20 DIAGNOSIS — Z8719 Personal history of other diseases of the digestive system: Secondary | ICD-10-CM

## 2017-09-20 DIAGNOSIS — K9423 Gastrostomy malfunction: Secondary | ICD-10-CM | POA: Diagnosis not present

## 2017-09-20 DIAGNOSIS — J851 Abscess of lung with pneumonia: Secondary | ICD-10-CM | POA: Insufficient documentation

## 2017-09-20 DIAGNOSIS — I10 Essential (primary) hypertension: Secondary | ICD-10-CM | POA: Diagnosis present

## 2017-09-20 DIAGNOSIS — J439 Emphysema, unspecified: Secondary | ICD-10-CM | POA: Diagnosis present

## 2017-09-20 DIAGNOSIS — F419 Anxiety disorder, unspecified: Secondary | ICD-10-CM | POA: Diagnosis present

## 2017-09-20 DIAGNOSIS — Z87891 Personal history of nicotine dependence: Secondary | ICD-10-CM

## 2017-09-20 DIAGNOSIS — Z9011 Acquired absence of right breast and nipple: Secondary | ICD-10-CM | POA: Diagnosis not present

## 2017-09-20 DIAGNOSIS — Z792 Long term (current) use of antibiotics: Secondary | ICD-10-CM

## 2017-09-20 DIAGNOSIS — G894 Chronic pain syndrome: Secondary | ICD-10-CM | POA: Diagnosis present

## 2017-09-20 DIAGNOSIS — J441 Chronic obstructive pulmonary disease with (acute) exacerbation: Secondary | ICD-10-CM | POA: Diagnosis present

## 2017-09-20 DIAGNOSIS — R112 Nausea with vomiting, unspecified: Secondary | ICD-10-CM | POA: Diagnosis not present

## 2017-09-20 DIAGNOSIS — J852 Abscess of lung without pneumonia: Secondary | ICD-10-CM | POA: Diagnosis present

## 2017-09-20 DIAGNOSIS — Z79891 Long term (current) use of opiate analgesic: Secondary | ICD-10-CM

## 2017-09-20 DIAGNOSIS — L8990 Pressure ulcer of unspecified site, unspecified stage: Secondary | ICD-10-CM

## 2017-09-20 DIAGNOSIS — I480 Paroxysmal atrial fibrillation: Secondary | ICD-10-CM | POA: Diagnosis not present

## 2017-09-20 DIAGNOSIS — Z981 Arthrodesis status: Secondary | ICD-10-CM

## 2017-09-20 DIAGNOSIS — R1310 Dysphagia, unspecified: Secondary | ICD-10-CM

## 2017-09-20 DIAGNOSIS — E871 Hypo-osmolality and hyponatremia: Secondary | ICD-10-CM | POA: Diagnosis not present

## 2017-09-20 DIAGNOSIS — E785 Hyperlipidemia, unspecified: Secondary | ICD-10-CM | POA: Diagnosis present

## 2017-09-20 DIAGNOSIS — R1110 Vomiting, unspecified: Secondary | ICD-10-CM | POA: Diagnosis not present

## 2017-09-20 DIAGNOSIS — K228 Other specified diseases of esophagus: Secondary | ICD-10-CM

## 2017-09-20 DIAGNOSIS — R1319 Other dysphagia: Secondary | ICD-10-CM

## 2017-09-20 DIAGNOSIS — J853 Abscess of mediastinum: Secondary | ICD-10-CM | POA: Diagnosis present

## 2017-09-20 DIAGNOSIS — R918 Other nonspecific abnormal finding of lung field: Secondary | ICD-10-CM | POA: Diagnosis not present

## 2017-09-20 DIAGNOSIS — R531 Weakness: Secondary | ICD-10-CM | POA: Diagnosis not present

## 2017-09-20 DIAGNOSIS — A419 Sepsis, unspecified organism: Secondary | ICD-10-CM | POA: Diagnosis present

## 2017-09-20 DIAGNOSIS — Z853 Personal history of malignant neoplasm of breast: Secondary | ICD-10-CM

## 2017-09-20 DIAGNOSIS — Z96643 Presence of artificial hip joint, bilateral: Secondary | ICD-10-CM | POA: Diagnosis present

## 2017-09-20 DIAGNOSIS — K2289 Other specified disease of esophagus: Secondary | ICD-10-CM

## 2017-09-20 DIAGNOSIS — I2510 Atherosclerotic heart disease of native coronary artery without angina pectoris: Secondary | ICD-10-CM | POA: Diagnosis present

## 2017-09-20 DIAGNOSIS — K529 Noninfective gastroenteritis and colitis, unspecified: Secondary | ICD-10-CM | POA: Diagnosis present

## 2017-09-20 DIAGNOSIS — Z955 Presence of coronary angioplasty implant and graft: Secondary | ICD-10-CM | POA: Diagnosis not present

## 2017-09-20 DIAGNOSIS — E860 Dehydration: Secondary | ICD-10-CM | POA: Diagnosis present

## 2017-09-20 DIAGNOSIS — K223 Perforation of esophagus: Secondary | ICD-10-CM | POA: Diagnosis present

## 2017-09-20 DIAGNOSIS — I252 Old myocardial infarction: Secondary | ICD-10-CM

## 2017-09-20 DIAGNOSIS — R0682 Tachypnea, not elsewhere classified: Secondary | ICD-10-CM | POA: Diagnosis not present

## 2017-09-20 DIAGNOSIS — L89151 Pressure ulcer of sacral region, stage 1: Secondary | ICD-10-CM | POA: Diagnosis present

## 2017-09-20 DIAGNOSIS — R590 Localized enlarged lymph nodes: Secondary | ICD-10-CM | POA: Diagnosis not present

## 2017-09-20 DIAGNOSIS — G8929 Other chronic pain: Secondary | ICD-10-CM | POA: Diagnosis not present

## 2017-09-20 DIAGNOSIS — Z881 Allergy status to other antibiotic agents status: Secondary | ICD-10-CM

## 2017-09-20 DIAGNOSIS — J449 Chronic obstructive pulmonary disease, unspecified: Secondary | ICD-10-CM | POA: Diagnosis present

## 2017-09-20 DIAGNOSIS — Z7951 Long term (current) use of inhaled steroids: Secondary | ICD-10-CM

## 2017-09-20 LAB — COMPREHENSIVE METABOLIC PANEL
ALBUMIN: 2.6 g/dL — AB (ref 3.5–5.0)
ALT: 16 U/L (ref 14–54)
AST: 27 U/L (ref 15–41)
Alkaline Phosphatase: 125 U/L (ref 38–126)
Anion gap: 12 (ref 5–15)
BILIRUBIN TOTAL: 0.8 mg/dL (ref 0.3–1.2)
BUN: 15 mg/dL (ref 6–20)
CHLORIDE: 88 mmol/L — AB (ref 101–111)
CO2: 27 mmol/L (ref 22–32)
Calcium: 8.5 mg/dL — ABNORMAL LOW (ref 8.9–10.3)
Creatinine, Ser: 0.62 mg/dL (ref 0.44–1.00)
GFR calc Af Amer: >60 mL/min (ref >60)
GFR calc non Af Amer: >60 mL/min (ref >60)
GLUCOSE: 140 mg/dL — AB (ref 65–99)
POTASSIUM: 3.6 mmol/L (ref 3.5–5.1)
Sodium: 127 mmol/L — ABNORMAL LOW (ref 135–145)
Total Protein: 7.7 g/dL (ref 6.5–8.1)

## 2017-09-20 LAB — CBC WITH DIFFERENTIAL/PLATELET
Basophils Absolute: 0 10*3/uL (ref 0.0–0.1)
Basophils Relative: 0 %
Eosinophils Absolute: 0 10*3/uL (ref 0.0–0.7)
Eosinophils Relative: 0 %
HEMATOCRIT: 32.2 % — AB (ref 36.0–46.0)
Hemoglobin: 11.1 g/dL — ABNORMAL LOW (ref 12.0–15.0)
LYMPHS PCT: 6 %
Lymphs Abs: 1.1 10*3/uL (ref 0.7–4.0)
MCH: 29.4 pg (ref 26.0–34.0)
MCHC: 34.5 g/dL (ref 30.0–36.0)
MCV: 85.2 fL (ref 78.0–100.0)
MONO ABS: 1.5 10*3/uL — AB (ref 0.1–1.0)
MONOS PCT: 8 %
NEUTROS ABS: 15.3 10*3/uL — AB (ref 1.7–7.7)
Neutrophils Relative %: 86 %
PLATELETS: 398 10*3/uL (ref 150–400)
RBC: 3.78 MIL/uL — ABNORMAL LOW (ref 3.87–5.11)
RDW: 13.1 % (ref 11.5–15.5)
WBC: 17.9 10*3/uL — ABNORMAL HIGH (ref 4.0–10.5)

## 2017-09-20 LAB — I-STAT CG4 LACTIC ACID, ED: Lactic Acid, Venous: 1.52 mmol/L (ref 0.5–1.9)

## 2017-09-20 LAB — PROTIME-INR
INR: 1.23
PROTHROMBIN TIME: 15.4 s — AB (ref 11.4–15.2)

## 2017-09-20 MED ORDER — GABAPENTIN 300 MG PO CAPS
600.0000 mg | ORAL_CAPSULE | Freq: Four times a day (QID) | ORAL | Status: DC
  Administered 2017-09-21 – 2017-09-22 (×6): 600 mg via ORAL
  Filled 2017-09-20 (×6): qty 2

## 2017-09-20 MED ORDER — DEXTROSE 5 % IV SOLN
1.0000 g | Freq: Once | INTRAVENOUS | Status: AC
  Administered 2017-09-20: 1 g via INTRAVENOUS
  Filled 2017-09-20: qty 1

## 2017-09-20 MED ORDER — SENNOSIDES-DOCUSATE SODIUM 8.6-50 MG PO TABS: 1.0000 | ORAL_TABLET | Freq: Every evening | ORAL | Status: DC | PRN

## 2017-09-20 MED ORDER — VANCOMYCIN HCL IN DEXTROSE 1-5 GM/200ML-% IV SOLN
1000.0000 mg | Freq: Once | INTRAVENOUS | Status: AC
  Administered 2017-09-20: 1000 mg via INTRAVENOUS
  Filled 2017-09-20: qty 200

## 2017-09-20 MED ORDER — OXYCODONE HCL 5 MG PO TABS
15.0000 mg | ORAL_TABLET | Freq: Four times a day (QID) | ORAL | Status: DC | PRN
  Administered 2017-09-21 – 2017-09-22 (×6): 15 mg via ORAL
  Filled 2017-09-20 (×6): qty 3

## 2017-09-20 MED ORDER — SODIUM CHLORIDE 0.9 % IV BOLUS (SEPSIS)
1000.0000 mL | Freq: Once | INTRAVENOUS | Status: AC
  Administered 2017-09-20: 1000 mL via INTRAVENOUS

## 2017-09-20 MED ORDER — SODIUM CHLORIDE 0.9 % IV SOLN
INTRAVENOUS | Status: AC
  Administered 2017-09-20: via INTRAVENOUS

## 2017-09-20 MED ORDER — IOPAMIDOL (ISOVUE-300) INJECTION 61%
75.0000 mL | Freq: Once | INTRAVENOUS | Status: AC | PRN
Start: 2017-09-20 — End: 2017-09-21
  Administered 2017-09-21: 75 mL via INTRAVENOUS

## 2017-09-20 MED ORDER — ACETAMINOPHEN 325 MG PO TABS
650.0000 mg | ORAL_TABLET | Freq: Four times a day (QID) | ORAL | Status: DC | PRN
  Administered 2017-09-21: 650 mg via ORAL
  Filled 2017-09-20: qty 2

## 2017-09-20 MED ORDER — LOSARTAN POTASSIUM 25 MG PO TABS
25.0000 mg | ORAL_TABLET | Freq: Every day | ORAL | Status: DC
  Administered 2017-09-21 – 2017-09-22 (×2): 25 mg via ORAL
  Filled 2017-09-20 (×2): qty 1

## 2017-09-20 MED ORDER — IOPAMIDOL (ISOVUE-300) INJECTION 61%
INTRAVENOUS | Status: AC
  Administered 2017-09-21: 75 mL via INTRAVENOUS
  Filled 2017-09-20: qty 75

## 2017-09-20 MED ORDER — FAMOTIDINE IN NACL 20-0.9 MG/50ML-% IV SOLN
20.0000 mg | Freq: Two times a day (BID) | INTRAVENOUS | Status: DC
  Administered 2017-09-21 – 2017-09-22 (×4): 20 mg via INTRAVENOUS
  Filled 2017-09-20 (×5): qty 50

## 2017-09-20 MED ORDER — IOPAMIDOL (ISOVUE-300) INJECTION 61%
100.0000 mL | Freq: Once | INTRAVENOUS | Status: AC | PRN
  Administered 2017-09-20: 100 mL via INTRAVENOUS

## 2017-09-20 MED ORDER — GABAPENTIN 300 MG PO CAPS
600.0000 mg | ORAL_CAPSULE | Freq: Once | ORAL | Status: AC
  Administered 2017-09-20: 600 mg via ORAL
  Filled 2017-09-20: qty 2

## 2017-09-20 MED ORDER — MOMETASONE FURO-FORMOTEROL FUM 200-5 MCG/ACT IN AERO
2.0000 | INHALATION_SPRAY | Freq: Two times a day (BID) | RESPIRATORY_TRACT | Status: DC
  Administered 2017-09-21 – 2017-09-22 (×3): 2 via RESPIRATORY_TRACT
  Filled 2017-09-20: qty 8.8

## 2017-09-20 MED ORDER — DICLOFENAC SODIUM 75 MG PO TBEC
75.0000 mg | DELAYED_RELEASE_TABLET | Freq: Every day | ORAL | Status: DC | PRN
  Filled 2017-09-20: qty 1

## 2017-09-20 MED ORDER — ACETAMINOPHEN 650 MG RE SUPP: 650.0000 mg | Freq: Four times a day (QID) | RECTAL | Status: DC | PRN

## 2017-09-20 MED ORDER — MORPHINE SULFATE (PF) 4 MG/ML IV SOLN
4.0000 mg | Freq: Once | INTRAVENOUS | Status: AC
  Administered 2017-09-20: 4 mg via INTRAVENOUS
  Filled 2017-09-20: qty 1

## 2017-09-20 MED ORDER — ONDANSETRON HCL 4 MG PO TABS: 4.0000 mg | ORAL_TABLET | Freq: Four times a day (QID) | ORAL | Status: DC | PRN

## 2017-09-20 MED ORDER — MORPHINE SULFATE ER 30 MG PO TBCR
30.0000 mg | EXTENDED_RELEASE_TABLET | Freq: Four times a day (QID) | ORAL | Status: DC
  Administered 2017-09-21 – 2017-09-22 (×8): 30 mg via ORAL
  Filled 2017-09-20: qty 2
  Filled 2017-09-20 (×2): qty 1
  Filled 2017-09-20: qty 2
  Filled 2017-09-20: qty 1
  Filled 2017-09-20 (×2): qty 2
  Filled 2017-09-20: qty 1

## 2017-09-20 MED ORDER — ALBUTEROL SULFATE (2.5 MG/3ML) 0.083% IN NEBU: 2.5000 mg | INHALATION_SOLUTION | RESPIRATORY_TRACT | Status: DC | PRN

## 2017-09-20 MED ORDER — ONDANSETRON HCL 4 MG/2ML IJ SOLN: 4.0000 mg | Freq: Four times a day (QID) | INTRAMUSCULAR | Status: DC | PRN

## 2017-09-20 MED ORDER — ENOXAPARIN SODIUM 40 MG/0.4ML ~~LOC~~ SOLN
40.0000 mg | Freq: Every day | SUBCUTANEOUS | Status: DC
  Administered 2017-09-21: 40 mg via SUBCUTANEOUS
  Filled 2017-09-20 (×2): qty 0.4

## 2017-09-20 MED ORDER — ACETAMINOPHEN 325 MG PO TABS
650.0000 mg | ORAL_TABLET | Freq: Once | ORAL | Status: AC
  Administered 2017-09-20: 650 mg via ORAL
  Filled 2017-09-20: qty 2

## 2017-09-20 MED ORDER — PIPERACILLIN-TAZOBACTAM 3.375 G IVPB 30 MIN
3.3750 g | Freq: Once | INTRAVENOUS | Status: AC
  Administered 2017-09-20: 3.375 g via INTRAVENOUS
  Filled 2017-09-20: qty 50

## 2017-09-20 NOTE — ED Notes (Signed)
Blood cultures completed.   

## 2017-09-20 NOTE — H&P (Signed)
History and Physical    [name]:[id number] DOB: 1946/07/31 DOA: 09/20/2017  PCP: Ileana Ladd, MD   Patient coming from: Home  Chief Complaint: Subjective fevers, chills, anorexia   HPI: [name] is a 72 y.o. 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  Over the past 4 days, she has developed a general malaise with subjective fevers and chills, as well as anorexia.  No chest pain, headaches, or focal numbness or weakness.  Reports continued adherence with her medications.  ED Course: Upon arrival to the ED, patient is found to have a temp of 37.9 C, saturating low 90s on room air, mildly tachycardic, and with vitals otherwise normal.  Chest x-ray features a masslike cavitated region in the medial left upper lobe which is followed by CT which demonstrates abscess in the superior mediastinum contiguous with left apical lung abscess and likely reflecting recurrent esophageal perforation with abscess.  Chemistry panel is notable for sodium 127.  CBC features a leukocytosis to 17,900.  Lactic acid is reassuringly normal.  Blood cultures were collected, 1 L of normal saline was administered, and the patient was started on empiric cefepime and vancomycin.  CT surgery was consulted by the ED physician and recommended a medical admission to [facility].  Patient remains hemodynamically stable, is not in acute distress, and will be admitted to the symmetry unit at [facility] for ongoing evaluation and management suspected recurrent esophageal perforation with abscess.  Review of Systems:    All other systems reviewed and apart from HPI, are negative.  Past Medical History:  Diagnosis Date  . Asthma   . Breast cancer (HCC)    right breast  . COPD (chronic obstructive pulmonary disease) (HCC)   . Coronary artery disease   . Diverticulum of esophagus   . Elevated LFTs   . Emphysema lung (HCC)   . ETOH abuse   . H/O atrial fibrillation without current medication    only one time when she had sepsis  . Hyperlipidemia   . Hypertension    hx of but not on any medications  . Myocardial infarction (HCC) 2000  . OA (osteoarthritis) of knee   . Osteoarthritis   . Tobacco abuse     Past Surgical History:  Procedure Laterality Date  . ANTERIOR HIP REVISION Right 10/22/2015   Procedure: RIGHT  HIP REVISION;  Surgeon: Durene Romans, MD;  Location: [facility] ORS;  Service: Orthopedics;  Laterality: Right;  . APPLICATION OF WOUND VAC N/A 06/20/2015   Procedure: APPLICATION OF INCISIONAL WOUND VAC;  Surgeon: Venita Lick, MD;  Location: [facility] OR;  Service: Orthopedics;  Laterality: N/A;  . BREAST SURGERY  1991   right mastectomy  . CARDIAC CATHETERIZATION  04/05/2009   EF 60%  . CARDIOVASCULAR STRESS TEST  01/31/2005   EF 58%  . CESAREAN SECTION  '78, '80, '81   x 3  . CORONARY ANGIOPLASTY  08/1998   x2 OF A BIFURCATION OM-1, OM-2 LESION  . CORONARY ANGIOPLASTY WITH STENT PLACEMENT  01/1999   MID FIRST OBTUSE MARGINAL VESSEL  . CORONARY ANGIOPLASTY WITH STENT PLACEMENT  07/1999   STENTING AT THE  CRUX OF THE RIGHT CORONARY ARTERY WITH A 3.8MM X TETRA STENT  . DIRECT LARYNGOSCOPY N/A 05/03/2015   Procedure: DIRECT LARYNGOSCOPY;  Surgeon: Flo Shanks, MD;  Location: [facility] OR;  Service: ENT;  Laterality: N/A;  . EYE SURGERY  05/18/2014,06/01/2014   BILATERAL CATARACT S WITH LENS IMPLANTS  . GASTROSTOMY N/A 05/04/2015   Procedure: OPEN GASTROSTOMY WITH TUBE PLACEMENT;  Surgeon: Manus Rudd, MD;  Location: [facility] OR;  Service: General;  Laterality: N/A;  . GASTROSTOMY N/A 11/13/2016    Procedure: INSERTION OF GASTROSTOMY TUBE;  Surgeon: Abigail Miyamoto, MD;  Location: [facility] OR;  Service: General;  Laterality: N/A;  . HARDWARE REMOVAL N/A 05/03/2015   Procedure: HARDWARE REMOVAL;  Surgeon: Venita Lick, MD;  Location: [facility] OR;  Service: Orthopedics;  Laterality: N/A;  . HIP CLOSED REDUCTION Right 04/26/2015   Procedure: CLOSED REDUCTION HIP;  Surgeon: Venita Lick, MD;  Location: [facility];  Service: Orthopedics;  Laterality: Right;  . HYSTEROSCOPY     D & C  . INCISION AND DRAINAGE ABSCESS N/A 05/03/2015   Procedure: INCISION AND DRAINAGE CERVICAL  ABSCESS AND REMOVAL OF HARDWARE;  Surgeon: Venita Lick, MD;  Location: [facility] OR;  Service: Orthopedics;  Laterality: N/A;  . JOINT REPLACEMENT  08/2011   bilateral hip  . JOINT REPLACEMENT  01/2012   right hip  . MASTECTOMY    . neck fusion  2011  . PELVIC LAPAROSCOPY  2002   RSO-    . RADICAL NECK DISSECTION N/A 11/08/2016   Procedure: INCISION AND DRAINAGE OF NECK ABSCESS;  Surgeon: Osborn Coho, MD;  Location: [facility] OR;  Service: ENT;  Laterality: N/A;  . RADIOLOGY WITH ANESTHESIA Right 06/28/2015   Procedure: MRI OF CERVICAL SPINE  AND RIGHT HIP  WITH AND WITHOUT CONTRAST    (RADIOLOGY WITH ANESTHESIA);  Surgeon: Medication Radiologist, MD;  Location: [facility] OR;  Service: Radiology;  Laterality: Right;  . REMOVAL OF GASTROSTOMY TUBE N/A 11/14/2016   Procedure: REMOVAL OF GASTROSTOMY TUBE W/ REPLACEMENT OF GASTROSTOMY TUBE;  Surgeon: Abigail Miyamoto, MD;  Location: [facility] OR;  Service: General;  Laterality: N/A;  . RIGID ESOPHAGOSCOPY N/A 05/03/2015   Procedure: RIGID ESOPHAGOSCOPY;  Surgeon: Flo Shanks, MD;  Location: [facility] OR;  Service: ENT;  Laterality: N/A;  . TONSILLECTOMY AND ADENOIDECTOMY    . TOTAL HIP ARTHROPLASTY  08/2010   bilat  . VULVECTOMY  1981   partial     reports that she has quit smoking. Her smoking use included cigarettes. She has a 50.00 pack-year smoking history. she has never used smokeless tobacco. She reports  that she does not drink alcohol or use drugs.  Allergies  Allergen Reactions  . Cephalexin Other (See Comments)    Took off first layer of skin inside of mouth  . Zithromax [Azithromycin Dihydrate] Other (See Comments)    ORAL ULCERS     Family History  Problem Relation Age of Onset  . Diabetes Mother   . Hypertension Father   . Heart disease Father   . Heart attack Father   . Stroke Father      Prior to Admission medications   Medication Sig Start Date End Date Taking? Authorizing Provider  albuterol (PROVENTIL HFA;VENTOLIN HFA) 108 (90 BASE) MCG/ACT inhaler Inhale 2 puffs into the lungs every 6 (six) hours as needed for wheezing.    Yes [provider]  amoxicillin-clavulanate (AUGMENTIN) 875-125 MG tablet Take 1 tablet by mouth 2 (two) times daily. 06/22/17  Yes Judyann Munson, MD  diclofenac (  VOLTAREN) 75 MG EC tablet Take 75 mg by mouth daily as needed for moderate pain.  11/01/16  Yes [provider]  furosemide (LASIX) 40 MG tablet TAKE 1 TABLET BY MOUTH TWICE A DAY Patient taking differently: TAKE 1 TABLET BY MOUTH ONCE DAILY 05/20/17  Yes [place], Peter M, MD  gabapentin (NEURONTIN) 600 MG tablet Take 600 mg by mouth 4 (four) times daily.    Yes [provider]  losartan (COZAAR) 25 MG tablet Take 25 mg by mouth daily.  04/04/15  Yes [provider]  morphine (MS CONTIN) 30 MG 12 hr tablet Take 30 mg by mouth 4 (four) times daily.  12/23/16  Yes [provider]  oxyCODONE (ROXICODONE) 15 MG immediate release tablet Take 15 mg by mouth 4 (four) times daily.   Yes [provider]  ALPRAZolam Prudy Feeler) 0.25 MG tablet Take one tablet daily or as needed Patient not taking: Reported on 09/20/2017 05/14/11   [place], Peter M, MD  budesonide-formoterol [facility]) 160-4.5 MCG/ACT inhaler Inhale 2 puffs into the lungs 2 (two) times daily as needed (Wheezing). Reported on 10/16/2015    [provider]    Physical Exam: Vitals:    09/20/17 1750 09/20/17 2041 09/20/17 2208 09/20/17 2230  BP: 125/76 121/82 (!) 119/54 (!) 114/59  Pulse: (!) 113 (!) 107 81 73  Resp: 20 18 18 19   Temp: 100.3 F (37.9 C) 98.4 F (36.9 C) 98.4 F (36.9 C)   TempSrc: Oral Oral Oral   SpO2: 90% 93% 94% 96%  Weight:   59 kg (130 lb)   Height:   5\' 2"  (1.575 m)       Constitutional: NAD, calm, in apparent discomfort Eyes: PERTLA, lids and conjunctivae normal ENMT: Mucous membranes are moist. Posterior pharynx clear of any exudate or lesions.   Neck: normal, supple, no masses, no thyromegaly Respiratory: clear to auscultation bilaterally, no wheezing, no crackles. Normal respiratory effort.  Cardiovascular: S1 & S2 heard, regular rate and rhythm. No significant JVD. Abdomen: No distension, no tenderness, no masses palpated. Bowel sounds normal.  Musculoskeletal: no clubbing / cyanosis. No joint deformity upper and lower extremities.    Skin: no significant rashes, lesions, ulcers. Warm, dry, well-perfused. Neurologic: CN 2-12 grossly intact. Sensation intact. Strength 5/5 in all 4 limbs.  Psychiatric: Alert and oriented x 3. Pleasant and cooperative.     Labs on Admission: I have personally reviewed following labs and imaging studies  CBC: Recent Labs  Lab 09/20/17 1826  WBC 17.9*  NEUTROABS 15.3*  HGB 11.1*  HCT 32.2*  MCV 85.2  PLT 398   Basic Metabolic Panel: Recent Labs  Lab 09/20/17 1826  NA 127*  K 3.6  CL 88*  CO2 27  GLUCOSE 140*  BUN 15  CREATININE 0.62  CALCIUM 8.5*   GFR: Estimated Creatinine Clearance: 52.5 mL/min (by C-G formula based on SCr of 0.62 mg/dL). Liver Function Tests: Recent Labs  Lab 09/20/17 1826  AST 27  ALT 16  ALKPHOS 125  BILITOT 0.8  PROT 7.7  ALBUMIN 2.6*   No results for input(s): LIPASE, AMYLASE in the last 168 hours. No results for input(s): AMMONIA in the last 168 hours. Coagulation Profile: Recent Labs  Lab 09/20/17 1826  INR 1.23   Cardiac Enzymes: No  results for input(s): CKTOTAL, CKMB, CKMBINDEX, TROPONINI in the last 168 hours. BNP (last 3 results) No results for input(s): PROBNP in the last 8760 hours. HbA1C: No results for input(s): HGBA1C in the last  72 hours. CBG: No results for input(s): GLUCAP in the last 168 hours. Lipid Profile: No results for input(s): CHOL, HDL, LDLCALC, TRIG, CHOLHDL, LDLDIRECT in the last 72 hours. Thyroid Function Tests: No results for input(s): TSH, T4TOTAL, FREET4, T3FREE, THYROIDAB in the last 72 hours. Anemia Panel: No results for input(s): VITAMINB12, FOLATE, FERRITIN, TIBC, IRON, RETICCTPCT in the last 72 hours. Urine analysis:    Component Value Date/Time   COLORURINE YELLOW 10/15/2015 0950   APPEARANCEUR CLEAR 10/15/2015 0950   LABSPEC 1.005 10/15/2015 0950   PHURINE 6.5 10/15/2015 0950   GLUCOSEU NEGATIVE 10/15/2015 0950   HGBUR NEGATIVE 10/15/2015 0950   BILIRUBINUR NEGATIVE 10/15/2015 0950   KETONESUR NEGATIVE 10/15/2015 0950   PROTEINUR NEGATIVE 10/15/2015 0950   UROBILINOGEN 1.0 04/26/2015 1748   NITRITE NEGATIVE 10/15/2015 0950   LEUKOCYTESUR NEGATIVE 10/15/2015 0950   Sepsis Labs: @LABRCNTIP (procalcitonin:4,lacticidven:4) )No results found for this or any previous visit (from the past 240 hour(s)).   Radiological Exams on Admission: Dg Chest 2 View  Result Date: 09/20/2017 CLINICAL DATA:  Weakness.  History of breast cancer. EXAM: CHEST  2 VIEW COMPARISON:  November 08, 2016 FINDINGS: There is a rounded cavitated masslike region in the medial left upper lung. Mild patchy opacity in the left lower lung suspected. No pneumothorax. The right lung is clear. No other definitive changes. IMPRESSION: There is a masslike cavitated region in the medial left upper lung which could represent a cavitated neoplasm or cavitated infectious process. There is also suspicion of patchy opacity throughout the left lower lung. Recommend CT imaging for better evaluation. Electronically Signed   By:  Gerome Sam III M.D   On: 09/20/2017 18:55    EKG: Not performed.   Assessment/Plan  1. Mediastinal abscess; suspected esophageal perforation  - Presents with subjective fevers, chills, anorexia  - Found to have abscess in superior mediastinum, contiguous with left apical lung cavitation, felt to reflect recurrent esophageal perforation with abscess  - She had surgery for Zenker's diverticulum with ENT, complicated by recurrent leak and abscess formation, followed by ID and has been on Augmentin   - CTS recommends admission to [facility], consultation with IR  - Blood cultures were collected in ED and empiric vancomycin and cefepime were administered  - Continue abx with vancomycin and Zosyn, follow cultures and clinical course, IR consulted    2. Hyponatremia  - Serum sodium is 127 on admission, previously normal  - Likely secondary to hypovolemia given recent anorexia  - Check serum and urine osm, urine sodium  - Start IVF hydration, follow serial chem panels   3. Chronic pain  - Stable  - Continue home-regimen with long-acting morphine, Oxy IR, gabapentin, Voltaren    4. CAD - No anginal complaints  - Continue losartan    5. Paroxysmal atrial fibrillation  - Sinus rhythm on admission  - CHADS-VASc is 62 (age, gender, HTN, CAD)  - A fib was only seen during an episode of sepsis  - Follows with cardiology, no recurrences noted, not anticoagulated    6. Hx of breast cancer  - Sclerosis of C7, T1, and T2 noted incidentally on CT, raising concern for metastatic disease  7. COPD  - Stable  - Continue ICS/LABA and prn albuterol     DVT prophylaxis: Lovenox  Code Status: Full  Family Communication: Husband updated by phone at patient's request Disposition Plan: Admit to telemetry at [facility] Consults called: CT surgery Admission status: Inpatient    Briscoe Deutscher, MD [facility]  Pager [phone]  If 7PM-7AM, please contact night-coverage [email or web address] Password  [email or web address]  09/20/2017, 10:51 PM

## 2017-09-20 NOTE — ED Notes (Signed)
Patient transported to CT 

## 2017-09-20 NOTE — ED Notes (Signed)
Patient ambulatory to restroom  ?

## 2017-09-20 NOTE — ED Provider Notes (Addendum)
[facility] DEPT Provider Note   CSN: [id number] Arrival date & time: 09/20/17  1652     History   Chief Complaint Chief Complaint  Patient presents with  . Failure To Thrive  . Not Feeling Well    HPI [name] is a 72 y.o. female  Hx of COPD, breast cancer s/p R mastectomy, previous esophageal leak with PEG tube placement, here with cough, fever, failure to thrive.  Patient states that about a week ago Wednesday, her G-tube was removed since she was tolerating oral intake very well.  Since it was removed, she states that she has not been eating well.  She has been very dehydrated.  For the last several days, she has been having productive cough as well as chills.  She also has some diffuse weakness as well. She was noted to be febrile, tachycardic and sepsis workup initiated.    Past Medical History:  Diagnosis Date  . Asthma   . Breast cancer ([place])    right breast  . COPD (chronic obstructive pulmonary disease) ([place])   . Coronary artery disease   . Diverticulum of esophagus   . Elevated LFTs   . Emphysema lung ([place])   . ETOH abuse   . H/O atrial fibrillation without current medication    only one time when she had sepsis  . Hyperlipidemia   . Hypertension    hx of but not on any medications  . Myocardial infarction ([place]) 2000  . OA (osteoarthritis) of knee   . Osteoarthritis   . Tobacco abuse     Patient Active Problem List   Diagnosis Date Noted  . Tobacco consumption 02/12/2016  . Hoarseness of voice 01/07/2016  . 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 Infected wound, initial encounter 06/20/2015  . Cholecystitis   . Esophageal fistula   . Abscess   . Discitis   . HCAP (healthcare-associated pneumonia)   . 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[place]) 04/28/2015  . Abscess of neck   . Esophageal perforation   . Loss of weight   . Esophageal dysphagia   . 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 Hip dislocation, right ([place]) 04/26/2015  . Healthcare-associated pneumonia 04/26/2015  . Cholecystitis, acute 04/26/2015  . 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 PAF (paroxysmal atrial fibrillation) ([place]) 09/20/2014  . Elevated troponin 09/20/2014  . Acute renal failure syndrome ([place])   . Malnutrition of moderate degree ([place]) 08/29/2014  . Pneumonia   . Respiratory failure ([place])   . UTI (urinary tract infection) 08/27/2014  . Sepsis ([place]) 08/27/2014  . Atrial fibrillation with RVR ([place]) 08/27/2014  . Shock circulatory ([place]) 08/27/2014  . Acute kidney injury ([place]) 08/27/2014  . Encephalopathy acute 08/27/2014  . Hypokalemia 08/26/2014  . Hyponatremia 08/26/2014  . Acute renal failure ([place]) 08/26/2014  . Weakness 08/26/2014  . ETOH abuse 08/07/2014  . Edema extremities 07/12/2014  . Atelectasis 08/16/2011  . Diabetes mellitus type 2, insulin dependent ([place])   . COPD (chronic obstructive pulmonary disease) ([place])   . Tobacco dependence   . Hyperlipidemia   . Essential hypertension   . Coronary artery disease     Past Surgical History:  Procedure Laterality Date  . ANTERIOR HIP REVISION Right 10/22/2015   Procedure: RIGHT  HIP REVISION;  Surgeon: Paralee Cancel, MD;  Location: [facility];  Service: Orthopedics;  Laterality: Right;  . APPLICATION OF WOUND VAC N/A 06/20/2015  Procedure: APPLICATION OF INCISIONAL WOUND VAC;  Surgeon: Melina Schools, MD;  Location: [facility];  Service: Orthopedics;  Laterality: N/A;  . BREAST SURGERY  1991   right mastectomy  . CARDIAC CATHETERIZATION  04/05/2009   EF 60%  . CARDIOVASCULAR STRESS TEST  01/31/2005   EF 58%  . CESAREAN SECTION  '78, '80, '81   x 3  . CORONARY ANGIOPLASTY  08/1998   x2 OF A BIFURCATION OM-1, OM-2 LESION  . CORONARY ANGIOPLASTY WITH STENT PLACEMENT  01/1999   MID FIRST OBTUSE MARGINAL VESSEL  . CORONARY ANGIOPLASTY WITH STENT PLACEMENT  07/1999   STENTING AT THE CRUX OF THE RIGHT CORONARY ARTERY  WITH A 3.8MM X 18MM TETRA STENT  . DIRECT LARYNGOSCOPY N/A 05/03/2015   Procedure: DIRECT LARYNGOSCOPY;  Surgeon: Jodi Marble, MD;  Location: [facility];  Service: ENT;  Laterality: N/A;  . EYE SURGERY  05/18/2014,06/01/2014   BILATERAL CATARACT S WITH LENS IMPLANTS  . GASTROSTOMY N/A 05/04/2015   Procedure: OPEN GASTROSTOMY WITH TUBE PLACEMENT;  Surgeon: Donnie Mesa, MD;  Location: [facility];  Service: General;  Laterality: N/A;  . GASTROSTOMY N/A 11/13/2016   Procedure: INSERTION OF GASTROSTOMY TUBE;  Surgeon: Coralie Keens, MD;  Location: [facility];  Service: General;  Laterality: N/A;  . HARDWARE REMOVAL N/A 05/03/2015   Procedure: HARDWARE REMOVAL;  Surgeon: Melina Schools, MD;  Location: [facility];  Service: Orthopedics;  Laterality: N/A;  . HIP CLOSED REDUCTION Right 04/26/2015   Procedure: CLOSED REDUCTION HIP;  Surgeon: Melina Schools, MD;  Location: [facility];  Service: Orthopedics;  Laterality: Right;  . HYSTEROSCOPY     D & C  . INCISION AND DRAINAGE ABSCESS N/A 05/03/2015   Procedure: INCISION AND DRAINAGE CERVICAL  ABSCESS AND REMOVAL OF HARDWARE;  Surgeon: Melina Schools, MD;  Location: [facility];  Service: Orthopedics;  Laterality: N/A;  . JOINT REPLACEMENT  08/2011   bilateral hip  . JOINT REPLACEMENT  01/2012   right hip  . MASTECTOMY    . neck fusion  2011  . PELVIC LAPAROSCOPY  2002   RSO-    . RADICAL NECK DISSECTION N/A 11/08/2016   Procedure: INCISION AND DRAINAGE OF NECK ABSCESS;  Surgeon: Jerrell Belfast, MD;  Location: [facility];  Service: ENT;  Laterality: N/A;  . RADIOLOGY WITH ANESTHESIA Right 06/28/2015   Procedure: MRI OF CERVICAL SPINE  AND RIGHT HIP  WITH AND WITHOUT CONTRAST    (RADIOLOGY WITH ANESTHESIA);  Surgeon: Medication Radiologist, MD;  Location: [facility];  Service: Radiology;  Laterality: Right;  . REMOVAL OF GASTROSTOMY TUBE N/A 11/14/2016   Procedure: REMOVAL OF GASTROSTOMY TUBE W/ REPLACEMENT OF GASTROSTOMY TUBE;  Surgeon: Coralie Keens, MD;  Location: [facility];  Service:  General;  Laterality: N/A;  . RIGID ESOPHAGOSCOPY N/A 05/03/2015   Procedure: RIGID ESOPHAGOSCOPY;  Surgeon: Jodi Marble, MD;  Location: [facility];  Service: ENT;  Laterality: N/A;  . TONSILLECTOMY AND ADENOIDECTOMY    . TOTAL HIP ARTHROPLASTY  08/2010   bilat  . VULVECTOMY  1981   partial    OB History    No data available       Home Medications    Prior to Admission medications   Medication Sig Start Date End Date Taking? Authorizing Provider  albuterol (PROVENTIL HFA;VENTOLIN HFA) 108 (90 BASE) MCG/ACT inhaler Inhale 2 puffs into the lungs every 6 (six) hours as needed for wheezing.     [provider]  ALPRAZolam Duanne Moron) 0.25 MG tablet Take one tablet daily  or as needed Patient taking differently: Take 0.25 mg by mouth daily as needed for anxiety.  05/14/11   [place]ique, Peter M, MD  amoxicillin-clavulanate (AUGMENTIN) 875-125 MG tablet Take 1 tablet by mouth 2 (two) times daily. 06/22/17   Carlyle Basques, MD  BIOFREEZE 4 % GEL APPLY TO AFFECTED AREA AS NEEDED 10/28/16   [provider]  budesonide-formoterol (SYMBICORT) 160-4.5 MCG/ACT inhaler Inhale 2 puffs into the lungs 2 (two) times daily as needed (Wheezing). Reported on 10/16/2015    [provider]  diclofenac (VOLTAREN) 75 MG EC tablet  11/01/16   [provider]  furosemide (LASIX) 40 MG tablet TAKE 1 TABLET BY MOUTH TWICE A DAY 05/20/17   [place], Peter M, MD  gabapentin (NEURONTIN) 600 MG tablet Take 600 mg by mouth 4 (four) times daily.     [provider]  losartan (COZAAR) 25 MG tablet Take 25 mg by mouth daily.  04/04/15   [provider]  methocarbamol (ROBAXIN) 500 MG tablet TAKE 1 TABLET BY MOUTH EVERY 8 HOURS FOR SPASM 10/30/16   [provider]  morphine (MS CONTIN) 30 MG 12 hr tablet  12/23/16   [provider]  Probiotic Product (PROBIOTIC PO) Take 1 capsule by mouth every other day.    [provider]    Family History Family History    Problem Relation Age of Onset  . Diabetes Mother   . Hypertension Father   . Heart disease Father   . Heart attack Father   . Stroke Father     Social History Social History   Tobacco Use  . Smoking status: Former Smoker    Packs/day: 1.00    Years: 50.00    Pack years: 50.00    Types: Cigarettes  . Smokeless tobacco: Never Used  . Tobacco comment: using chantix  Substance Use Topics  . Alcohol use: No    Alcohol/week: 0.0 oz    Comment: former alcohol abuse  . Drug use: No     Allergies   Zithromax [azithromycin dihydrate]   Review of Systems Review of Systems  Constitutional: Positive for chills and fatigue.  Respiratory: Positive for cough.   All other systems reviewed and are negative.    Physical Exam Updated Vital Signs BP 121/82 (BP Location: Right Arm)   Pulse (!) 107   Temp 98.4 F (36.9 C) (Oral)   Resp 18   SpO2 93%   Physical Exam  Constitutional: She is oriented to person, place, and time.  Chronically ill, dehydrated   HENT:  Head: Normocephalic.  MM dry   Eyes: Conjunctivae and EOM are normal. Pupils are equal, round, and reactive to light.  Neck: Normal range of motion. Neck supple.  Cardiovascular:  Tachycardia   Pulmonary/Chest:  + crackles L base, tachypneic   Abdominal: Soft. Bowel sounds are normal. She exhibits no distension. There is no tenderness.  G tube site healing, no signs of infection   Musculoskeletal: Normal range of motion.  Neurological: She is alert and oriented to person, place, and time.  Skin: Skin is warm.  Psychiatric: She has a normal mood and affect.  Nursing note and vitals reviewed.    ED Treatments / Results  Labs (all labs ordered are listed, but only abnormal results are displayed) Labs Reviewed  COMPREHENSIVE METABOLIC PANEL - Abnormal; Notable for the following components:      Result Value   Sodium 127 (*)    Chloride 88 (*)    Glucose, Bld  140 (*)    Calcium 8.5 (*)    Albumin 2.6 (*)     All other components within normal limits  CBC WITH DIFFERENTIAL/PLATELET - Abnormal; Notable for the following components:   WBC 17.9 (*)    RBC 3.78 (*)    Hemoglobin 11.1 (*)    HCT 32.2 (*)    Neutro Abs 15.3 (*)    Monocytes Absolute 1.5 (*)    All other components within normal limits  PROTIME-INR - Abnormal; Notable for the following components:   Prothrombin Time 15.4 (*)    All other components within normal limits  CULTURE, BLOOD (ROUTINE X 2)  CULTURE, BLOOD (ROUTINE X 2)  URINALYSIS, ROUTINE W REFLEX MICROSCOPIC  I-STAT CG4 LACTIC ACID, ED    EKG  EKG Interpretation None       Radiology Dg Chest 2 View  Result Date: 09/20/2017 CLINICAL DATA:  Weakness.  History of breast cancer. EXAM: CHEST  2 VIEW COMPARISON:  November 08, 2016 FINDINGS: There is a rounded cavitated masslike region in the medial left upper lung. Mild patchy opacity in the left lower lung suspected. No pneumothorax. The right lung is clear. No other definitive changes. IMPRESSION: There is a masslike cavitated region in the medial left upper lung which could represent a cavitated neoplasm or cavitated infectious process. There is also suspicion of patchy opacity throughout the left lower lung. Recommend CT imaging for better evaluation. Electronically Signed   By: Dorise Bullion III M.D   On: 09/20/2017 18:55    Procedures Procedures (including critical care time)  CRITICAL CARE Performed by: Wandra Arthurs   Total critical care time: 30 minutes  Critical care time was exclusive of separately billable procedures and treating other patients.  Critical care was necessary to treat or prevent imminent or life-threatening deterioration.  Critical care was time spent personally by me on the following activities: development of treatment plan with patient and/or surrogate as well as nursing, discussions with consultants, evaluation of patient's response to treatment, examination of patient, obtaining  history from patient or surrogate, ordering and performing treatments and interventions, ordering and review of laboratory studies, ordering and review of radiographic studies, pulse oximetry and re-evaluation of patient's condition.   Medications Ordered in ED Medications  vancomycin (VANCOCIN) IVPB 1000 mg/200 mL premix (not administered)  ceFEPIme (MAXIPIME) 1 g in dextrose 5 % 50 mL IVPB (1 g Intravenous New Bag/Given 09/20/17 2129)  sodium chloride 0.9 % bolus 1,000 mL (1,000 mLs Intravenous New Bag/Given 09/20/17 2130)  acetaminophen (TYLENOL) tablet 650 mg (650 mg Oral Given 09/20/17 2131)  iopamidol (ISOVUE-300) 61 % injection 100 mL (100 mLs Intravenous Contrast Given 09/20/17 2105)  morphine 4 MG/ML injection 4 mg (4 mg Intravenous Given 09/20/17 2131)  gabapentin (NEURONTIN) capsule 600 mg (600 mg Oral Given 09/20/17 2131)     Initial Impression / Assessment and Plan / ED Course  I have reviewed the triage vital signs and the nursing notes.  Pertinent labs & imaging results that were available during my care of the patient were reviewed by me and considered in my medical decision making (see chart for details).     ALVITA FANA is a 72 y.o. female here with cough, fever. Sepsis workup initiated in triage. I am concerned that given that she had esophageal leaks before that she may have aspiration pneumonia. Will give vanc/cefepime empirically.   10:03 PM CXR showed possible cavitary lesion vs mass. She has hx of breast cancer. She  is also hyponatremic to 127 which can be from dehydration or small cell lung cancer. CT chest/ab/pel ordered and results pending. Hospitalist to admit for pneumonia, hyponatremia, malignancy workup.   10:38 PM I discussed with Dr. Owens Shark from radiology. There seem to be food material and air in mediastinum, likely from recurrent esophageal leak. The cavitary lesion may be lung abscess. She had been managed by ENT before but I think given the extent of lung  involvement, I consulted Dr. Servando Snare from [facility]. He recommend transfer to [facility], IV abx. She will need gastrografin study to confirm leak and likely need CT surgery consult and possible IR drainage. Dr. Myna Hidalgo from hospitalist aware and will transfer to [facility].    Final Clinical Impressions(s) / ED Diagnoses   Final diagnoses:  None    ED Discharge Orders    None       Drenda Freeze, MD 09/20/17 2205    Drenda Freeze, MD 09/20/17 2240

## 2017-09-20 NOTE — ED Triage Notes (Signed)
Pt is presented by her spouse, both collaborate a report that since her enteral feeding tube was taken out about a week ago, she has not "been doing well, has not had any meaningful PO intake and is progressively worsening and getting weaker."

## 2017-09-21 LAB — CBC WITH DIFFERENTIAL/PLATELET
BASOS ABS: 0 10*3/uL (ref 0.0–0.1)
Basophils Relative: 0 %
EOS ABS: 0.1 10*3/uL (ref 0.0–0.7)
EOS PCT: 1 %
HCT: 28.3 % — ABNORMAL LOW (ref 36.0–46.0)
HEMOGLOBIN: 9.4 g/dL — AB (ref 12.0–15.0)
LYMPHS ABS: 1.1 10*3/uL (ref 0.7–4.0)
LYMPHS PCT: 11 %
MCH: 28.7 pg (ref 26.0–34.0)
MCHC: 33.2 g/dL (ref 30.0–36.0)
MCV: 86.3 fL (ref 78.0–100.0)
Monocytes Absolute: 0.9 10*3/uL (ref 0.1–1.0)
Monocytes Relative: 10 %
NEUTROS PCT: 78 %
Neutro Abs: 7.8 10*3/uL — ABNORMAL HIGH (ref 1.7–7.7)
PLATELETS: 317 10*3/uL (ref 150–400)
RBC: 3.28 MIL/uL — AB (ref 3.87–5.11)
RDW: 13.1 % (ref 11.5–15.5)
WBC: 9.9 10*3/uL (ref 4.0–10.5)

## 2017-09-21 LAB — BASIC METABOLIC PANEL
ANION GAP: 9 (ref 5–15)
ANION GAP: 9 (ref 5–15)
Anion gap: 9 (ref 5–15)
BUN: 13 mg/dL (ref 6–20)
BUN: 11 mg/dL (ref 6–20)
BUN: 11 mg/dL (ref 6–20)
CHLORIDE: 91 mmol/L — AB (ref 101–111)
CHLORIDE: 95 mmol/L — AB (ref 101–111)
CO2: 29 mmol/L (ref 22–32)
CO2: 29 mmol/L (ref 22–32)
CO2: 30 mmol/L (ref 22–32)
CREATININE: 0.46 mg/dL (ref 0.44–1.00)
Calcium: 8.1 mg/dL — ABNORMAL LOW (ref 8.9–10.3)
Calcium: 8 mg/dL — ABNORMAL LOW (ref 8.9–10.3)
Calcium: 8 mg/dL — ABNORMAL LOW (ref 8.9–10.3)
Chloride: 93 mmol/L — ABNORMAL LOW (ref 101–111)
Creatinine, Ser: 0.47 mg/dL (ref 0.44–1.00)
Creatinine, Ser: 0.38 mg/dL — ABNORMAL LOW (ref 0.44–1.00)
GFR calc Af Amer: >60 mL/min (ref >60)
Glucose, Bld: 141 mg/dL — ABNORMAL HIGH (ref 65–99)
Glucose, Bld: 141 mg/dL — ABNORMAL HIGH (ref 65–99)
Glucose, Bld: 139 mg/dL — ABNORMAL HIGH (ref 65–99)
POTASSIUM: 3 mmol/L — AB (ref 3.5–5.1)
POTASSIUM: 3.1 mmol/L — AB (ref 3.5–5.1)
Potassium: 2.9 mmol/L — ABNORMAL LOW (ref 3.5–5.1)
SODIUM: 129 mmol/L — AB (ref 135–145)
SODIUM: 133 mmol/L — AB (ref 135–145)
SODIUM: 132 mmol/L — AB (ref 135–145)

## 2017-09-21 LAB — URINALYSIS, ROUTINE W REFLEX MICROSCOPIC
BILIRUBIN URINE: NEGATIVE
Bacteria, UA: NONE SEEN
Glucose, UA: 50 mg/dL — AB
KETONES UR: NEGATIVE mg/dL
LEUKOCYTES UA: NEGATIVE
NITRITE: NEGATIVE
PROTEIN: 100 mg/dL — AB
Specific Gravity, Urine: 1.045 — ABNORMAL HIGH (ref 1.005–1.030)
pH: 6 (ref 5.0–8.0)

## 2017-09-21 LAB — OSMOLALITY, URINE: Osmolality, Ur: 444 mOsm/kg (ref 300–900)

## 2017-09-21 LAB — SODIUM, URINE, RANDOM

## 2017-09-21 LAB — CBG MONITORING, ED: GLUCOSE-CAPILLARY: 119 mg/dL — AB (ref 65–99)

## 2017-09-21 LAB — OSMOLALITY: OSMOLALITY: 279 mosm/kg (ref 275–295)

## 2017-09-21 MED ORDER — POTASSIUM CHLORIDE CRYS ER 20 MEQ PO TBCR
40.0000 meq | EXTENDED_RELEASE_TABLET | Freq: Once | ORAL | Status: AC
  Administered 2017-09-21: 40 meq via ORAL
  Filled 2017-09-21: qty 2

## 2017-09-21 MED ORDER — VANCOMYCIN HCL IN DEXTROSE 750-5 MG/150ML-% IV SOLN
750.0000 mg | INTRAVENOUS | Status: DC
  Administered 2017-09-21: 750 mg via INTRAVENOUS
  Filled 2017-09-21 (×3): qty 150

## 2017-09-21 MED ORDER — PIPERACILLIN-TAZOBACTAM 3.375 G IVPB
3.3750 g | Freq: Three times a day (TID) | INTRAVENOUS | Status: DC
  Administered 2017-09-21 – 2017-09-22 (×4): 3.375 g via INTRAVENOUS
  Filled 2017-09-21 (×6): qty 50

## 2017-09-21 MED ORDER — HEPARIN SODIUM (PORCINE) 5000 UNIT/ML IJ SOLN
5000.0000 [IU] | Freq: Three times a day (TID) | INTRAMUSCULAR | Status: DC
  Administered 2017-09-21 – 2017-09-22 (×3): 5000 [IU] via SUBCUTANEOUS
  Filled 2017-09-21 (×5): qty 1

## 2017-09-21 NOTE — Progress Notes (Signed)
Request received for possible image guided drainage of mediastinal abscess in patient.  Imaging studies were reviewed by Dr. Pascal Lux and he feels there are no IR percutaneous drainage options in this case.  Recommend cardiothoracic surgery evaluation. Pt awaiting transfer to [facility].

## 2017-09-21 NOTE — Progress Notes (Signed)
Pharmacy Antibiotic Note  [name] is a 72 y.o. female admitted on 09/20/2017 with Mediastinal abscess.  Pharmacy has been consulted for Vancomycin dosing.  Plan: Vancomycin 1gm iv x1 (2214), then 750mg  iv q24hr Goal AUC = 400 - 500 for all indications, except meningitis (goal AUC > 500 and Cmin 15-20 mcg/mL)   Height: 5\' 2"  (157.5 cm) Weight: 130 lb (59 kg) IBW/kg (Calculated) : 50.1  Temp (24hrs), Avg:99 F (37.2 C), Min:98.4 F (36.9 C), Max:100.3 F (37.9 C)  Recent Labs  Lab 09/20/17 1826 09/20/17 1840  WBC 17.9*  --   CREATININE 0.62  --   LATICACIDVEN  --  1.52    Estimated Creatinine Clearance: 52.5 mL/min (by C-G formula based on SCr of 0.62 mg/dL).    Allergies  Allergen Reactions  . Cephalexin Other (See Comments)    Took off first layer of skin inside of mouth  . Zithromax [Azithromycin Dihydrate] Other (See Comments)    ORAL ULCERS     Antimicrobials this admission: Vancomycin 09/20/2017 >>  Dose adjustments this admission: -  Microbiology results: pending  Thank you for allowing pharmacy to be a part of this patient's care.  [name] 09/21/2017 12:36 AM

## 2017-09-21 NOTE — Progress Notes (Signed)
Patient Demographics:    [name], is a 72 y.o. female, DOB - 03-Feb-1946, UYQ:[id number]  Admit date - 09/20/2017   Admitting Physician Briscoe Deutscher, MD  Outpatient Primary MD for the patient is Ileana Ladd, MD  LOS - 1   Chief Complaint  Patient presents with  . Failure To Thrive  . Not Feeling Well        Subjective:    [name] today has no fevers, no emesis,  No chest pain,  Resting in ED bed, RN at bedside   Assessment  & Plan :    Principal Problem:   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HCC) Active Problems:   COPD (chronic obstructive pulmonary disease) (HCC)   Essential hypertension   Coronary artery disease   Hyponatremia   Sepsis, unspecified organism (HCC)   PAF (paroxysmal atrial fibrillation) (HCC)   Esophageal perforation   Esophageal dysphagia   Esophageal fistula   Chronic pain  1) Mediastinal abscess; suspected esophageal perforation -no further fevers or chills, white count is down to 9.9 from 17.9 on admission, Imaging studies suggest abscess in superior mediastinum, contiguous with left apical lung cavitation, felt to reflect recurrent esophageal perforation with abscess, 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  Previously had care at [facility] in [place], at this time patient declines transfer to [facility] - CTS recommends admission to [facility], interventional radiology states that lesion is not amenable to image guided approach .   Continue iv vancomycin (started 09/20/17)  and Zosyn (started 09/21/17) pending blood cultures   2) Hyponatremia -sodium is up to 132 from 127 on admission, continue to hydrate  3)Chronic pain - - Stable, - Continue home-regimen with long-acting morphine, Oxy IR, gabapentin, Voltaren    4)Prior H/o  CAD/PAF- - Sinus  rhythm this admission , - CHADS-VASc is 20 (age, gender, HTN, CAD) , - A fib was only seen during an episode of sepsis , - Follows with cardiology, no recurrences noted, not anticoagulated  . No Acs type sxs  5) Hx of breast cancer -   Sclerosis of C7, T1, and T2 noted incidentally on CT, raising concern for metastatic disease  6) COPD - - Stable , no acute exacerbation at this time continue bronchodilators,   DVT prophylaxis:  Accord heparin Code Status: Full  Family Communication:  Disposition Plan: Admit to telemetry at [facility] CTS surgery to see Consults called: CT surgery Admission status: Inpatient     Lab Results  Component Value Date   PLT 317 09/21/2017    Inpatient Medications  Scheduled Meds: . enoxaparin (LOVENOX) injection  40 mg Subcutaneous QHS  . gabapentin  600 mg Oral QID  . losartan  25 mg Oral Daily  . mometasone-formoterol  2 puff Inhalation BID  . morphine  30 mg Oral Q6H   Continuous Infusions: . famotidine (PEPCID) IV Stopped (09/21/17 1108)  . piperacillin-tazobactam (ZOSYN)  IV Stopped (09/21/17 1713)  . vancomycin     PRN Meds:.acetaminophen **OR** acetaminophen, albuterol, diclofenac, ondansetron **OR** ondansetron (ZOFRAN) IV, oxyCODONE, senna-docusate    Anti-infectives (From admission, onward)   Start     Dose/Rate Route Frequency Ordered  Stop   09/21/17 2200  vancomycin (VANCOCIN) IVPB 750 mg/150 ml premix     750 mg 150 mL/hr over 60 Minutes Intravenous Every 24 hours 09/21/17 0035     09/21/17 1300  piperacillin-tazobactam (ZOSYN) IVPB 3.375 g     3.375 g 12.5 mL/hr over 240 Minutes Intravenous Every 8 hours 09/21/17 1239     09/20/17 2300  piperacillin-tazobactam (ZOSYN) IVPB 3.375 g    Comments:  Has been taking Augmentin   3.375 g 100 mL/hr over 30 Minutes Intravenous  Once 09/20/17 2251 09/21/17 0023   09/20/17 2100  vancomycin (VANCOCIN) IVPB 1000 mg/200 mL premix     1,000 mg 200 mL/hr over 60 Minutes Intravenous  Once 09/20/17  2045 09/20/17 2314   09/20/17 2100  ceFEPIme (MAXIPIME) 1 g in dextrose 5 % 50 mL IVPB     1 g 100 mL/hr over 30 Minutes Intravenous  Once 09/20/17 2045 09/20/17 2211        Objective:   Vitals:   09/21/17 1500 09/21/17 1541 09/21/17 1621 09/21/17 1730  BP: (!) 120/59 (!) 120/97 (!) 119/57 119/62  Pulse: 66 (!) 127 75 71  Resp: (!) 21 (!) 23 (!) 22 18  Temp:      TempSrc:      SpO2: 95%  98% 100%  Weight:      Height:        Wt Readings from Last 3 Encounters:  09/20/17 59 kg (130 lb)  08/10/17 62.1 kg (137 lb)  07/21/17 61.2 kg (135 lb)     Intake/Output Summary (Last 24 hours) at 09/21/2017 1854 Last data filed at 09/21/2017 1713 Gross per 24 hour  Intake 1450 ml  Output -  Net 1450 ml     Physical Exam  Gen:- Awake Alert,  In no apparent distress, speaking in sentences HEENT:- [place].AT, No sclera icterus Neck-Supple Neck,No JVD,.  Lungs-diminished breath sounds without significant wheezing at this time CV- S1, S2 normal, regular Abd-  +ve B.Sounds, Abd Soft, No tenderness,    Extremity/Skin:- No  edema,    Psych-affect is appropriate Neuro-no tremors and no new focal deficit    Data Review:   Micro Results No results found for this or any previous visit (from the past 240 hour(s)).  Radiology Reports Dg Chest 2 View  Result Date: 09/20/2017 CLINICAL DATA:  Weakness.  History of breast cancer. EXAM: CHEST  2 VIEW COMPARISON:  November 08, 2016 FINDINGS: There is a rounded cavitated masslike region in the medial left upper lung. Mild patchy opacity in the left lower lung suspected. No pneumothorax. The right lung is clear. No other definitive changes. IMPRESSION: There is a masslike cavitated region in the medial left upper lung which could represent a cavitated neoplasm or cavitated infectious process. There is also suspicion of patchy opacity throughout the left lower lung. Recommend CT imaging for better evaluation. Electronically Signed   By: Gerome Sam III  M.D   On: 09/20/2017 18:55   Ct Soft Tissue Neck W Contrast  Result Date: 09/21/2017 CLINICAL DATA:  72 y/o F; cough, fever, failure to thrive. History of right breast cancer post mastectomy. EXAM: CT NECK WITH CONTRAST TECHNIQUE: Multidetector CT imaging of the neck was performed using the standard protocol following the bolus administration of intravenous contrast. CONTRAST:  75 cc Isovue-300 COMPARISON:  09/20/2016 CT chest FINDINGS: Pharynx and larynx: Retropharyngeal/prevertebral abscess extending from the C3 level to upper mediastinum continuous with large cavitary abscess in left upper lobe of the  lung. 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 Extensive edema is present throughout the upper mediastinum and lower neck soft tissues. Widely patent airway. Salivary glands: No inflammation, mass, or stone. Thyroid: Normal. Lymph nodes: Extensive mediastinal adenopathy. Vascular: Negative. Limited intracranial: Negative. Visualized orbits: Negative. Mastoids and visualized paranasal sinuses: Clear. Skeleton: C3-C6 interbody fusion. Multilevel disc and facet degenerative changes greatest at the C3-4 level where there is severe canal stenosis and cord impingement. Upper chest: Left upper lobe cavitary abscess contiguous with upper mediastinum and retropharyngeal/ prevertebral space. Septal thickening and clustered nodules throughout left upper lobe likely representing acute bronchopneumonia. 6 mm nodule in right upper lobe (series 5, image 40). Other: None. IMPRESSION: 1. Large cavitary abscess in left upper lobe of the lung contiguous with the superior mediastinum and prevertebral/ retropharyngeal space. 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 2. Extensive inflammation within upper and anterior mediastinum as well as the lower neck soft  tissues. 3. Mediastinal adenopathy. Electronically Signed   By: Mitzi Hansen M.D.   On: 09/21/2017 02:12   Ct Chest W Contrast  Result Date: 09/20/2017 CLINICAL DATA:  Nausea and vomiting. Pt is presented by her spouse, both collaborate a report that since her enteral feeding tube was taken out about a week ago, she has not "been doing well, has not had any meaningful PO intake and is progressively worsening. EXAM: CT CHEST, ABDOMEN, AND PELVIS WITH CONTRAST TECHNIQUE: Multidetector CT imaging of the chest, abdomen and pelvis was performed following the standard protocol during bolus administration of intravenous contrast. CONTRAST:  ISOVUE-300 IOPAMIDOL (ISOVUE-300) INJECTION 61% COMPARISON:  Chest x-ray 09/20/2017, esophagram 11/11/2016 FINDINGS: CT CHEST FINDINGS Cardiovascular: There is pectus excavatum, accentuating the axis of the heart. Extensive coronary artery calcifications. Mitral calcifications. The thoracic aorta is partially calcified but not aneurysmal. The pulmonary arteries are well opacified accounting for the contrast bolus timing. Mediastinum/Nodes: There is a complex air-debris collection within the superior mediastinum, posterior to the upper esophagus. Collection measures 5.8 x 2.6 x 7.3 cm and is centered at T1-2. This is immediately adjacent to the left apical cavitary lesion, described below. Lungs/Pleura: Within the left lung apex, there is a thick-walled cavitary lesion which measures 6.3 x 6.7 cm. Air-fluid level is identified within this lesion. This cavitary lesion is contiguous with the mediastinal air -debris collection. There are reticular changes involving the left lung, right middle lobe, and medial right lower lobe. Large airways are patent. There is atelectasis at both lung bases, left greater than right. A small pulmonary nodule is identified in the right upper lobe, measuring 5 mm on image 36 of series 9. Musculoskeletal: Increased sclerosis of  incompletely imaged C7, T1, and T2, suspicious for metastatic disease. Degenerative changes are seen in the thoracic and lumbar spine. Bilateral hip arthroplasty. Additional: Right mastectomy. CT ABDOMEN PELVIS FINDINGS Hepatobiliary: There is mild intrahepatic biliary duct dilatation. Gallbladder is distended. Pancreas: Unremarkable. No pancreatic ductal dilatation or surrounding inflammatory changes. Spleen: Normal in size without focal abnormality. Adrenals/Urinary Tract: Normal adrenal glands. Small bilateral renal cysts. Urinary bladder is partially obscured by significant artifact in the pelvis. Stomach/Bowel: Visible tract from previous gastrostomy tube. Otherwise the stomach is normal in appearance. No bowel dilatation. Pelvic loops are partially obscured by metallic artifact. Vascular/Lymphatic: There is atherosclerotic calcification of the abdominal aorta, measuring up to  2.8 cm. No significant adenopathy in the pelvis. Reproductive: Uterus is obscured or absent. Other: None Musculoskeletal: Scoliosis and significant degenerative changes in the thoracolumbar spine. IMPRESSION: 1. Complex air and debris collection posterior to the esophagus in the superior mediastinum and contiguous with a large bowel cavitary lesion in the left upper lobe. The findings are consistent with esophageal/phalangeal perforation and abscess formation. 2. Sclerotic vertebral bodies of C7, T1, and T2, suspicious for metastatic disease given the history of right breast cancer. 3. Further evaluation with CT of the neck with intravenous contrast is recommended to evaluate the upper extent of the retroperitoneal abscess. 4. Coronary artery disease. 5.  Aortic atherosclerosis.  (ICD10-I70.0) 6. Abdominal aortic atherosclerosis. Ectatic abdominal aorta at risk for aneurysm development. Recommend followup by ultrasound in 5 years. This recommendation follows ACR consensus guidelines: White Paper of the ACR Incidental Findings Committee II  on Vascular Findings. J Am Coll Radiol 2013; 10:789-794. 7. The findings are discussed with Dr. Silverio Lay. Electronically Signed   By: Norva Pavlov M.D.   On: 09/20/2017 22:49   Ct Abdomen Pelvis W Contrast  Result Date: 09/20/2017 CLINICAL DATA:  Nausea and vomiting. Pt is presented by her spouse, both collaborate a report that since her enteral feeding tube was taken out about a week ago, she has not "been doing well, has not had any meaningful PO intake and is progressively worsening. EXAM: CT CHEST, ABDOMEN, AND PELVIS WITH CONTRAST TECHNIQUE: Multidetector CT imaging of the chest, abdomen and pelvis was performed following the standard protocol during bolus administration of intravenous contrast. CONTRAST:  ISOVUE-300 IOPAMIDOL (ISOVUE-300) INJECTION 61% COMPARISON:  Chest x-ray 09/20/2017, esophagram 11/11/2016 FINDINGS: CT CHEST FINDINGS Cardiovascular: There is pectus excavatum, accentuating the axis of the heart. Extensive coronary artery calcifications. Mitral calcifications. The thoracic aorta is partially calcified but not aneurysmal. The pulmonary arteries are well opacified accounting for the contrast bolus timing. Mediastinum/Nodes: There is a complex air-debris collection within the superior mediastinum, posterior to the upper esophagus. Collection measures 5.8 x 2.6 x 7.3 cm and is centered at T1-2. This is immediately adjacent to the left apical cavitary lesion, described below. Lungs/Pleura: Within the left lung apex, there is a thick-walled cavitary lesion which measures 6.3 x 6.7 cm. Air-fluid level is identified within this lesion. This cavitary lesion is contiguous with the mediastinal air -debris collection. There are reticular changes involving the left lung, right middle lobe, and medial right lower lobe. Large airways are patent. There is atelectasis at both lung bases, left greater than right. A small pulmonary nodule is identified in the right upper lobe, measuring 5 mm on image  36 of series 9. Musculoskeletal: Increased sclerosis of incompletely imaged C7, T1, and T2, suspicious for metastatic disease. Degenerative changes are seen in the thoracic and lumbar spine. Bilateral hip arthroplasty. Additional: Right mastectomy. CT ABDOMEN PELVIS FINDINGS Hepatobiliary: There is mild intrahepatic biliary duct dilatation. Gallbladder is distended. Pancreas: Unremarkable. No pancreatic ductal dilatation or surrounding inflammatory changes. Spleen: Normal in size without focal abnormality. Adrenals/Urinary Tract: Normal adrenal glands. Small bilateral renal cysts. Urinary bladder is partially obscured by significant artifact in the pelvis. Stomach/Bowel: Visible tract from previous gastrostomy tube. Otherwise the stomach is normal in appearance. No bowel dilatation. Pelvic loops are partially obscured by metallic artifact. Vascular/Lymphatic: There is atherosclerotic calcification of the abdominal aorta, measuring up to 2.8 cm. No significant adenopathy in the pelvis. Reproductive: Uterus is obscured or absent. Other: None Musculoskeletal: Scoliosis and significant degenerative changes in the thoracolumbar spine.  IMPRESSION: 1. Complex air and debris collection posterior to the esophagus in the superior mediastinum and contiguous with a large bowel cavitary lesion in the left upper lobe. The findings are consistent with esophageal/phalangeal perforation and abscess formation. 2. Sclerotic vertebral bodies of C7, T1, and T2, suspicious for metastatic disease given the history of right breast cancer. 3. Further evaluation with CT of the neck with intravenous contrast is recommended to evaluate the upper extent of the retroperitoneal abscess. 4. Coronary artery disease. 5.  Aortic atherosclerosis.  (ICD10-I70.0) 6. Abdominal aortic atherosclerosis. Ectatic abdominal aorta at risk for aneurysm development. Recommend followup by ultrasound in 5 years. This recommendation follows ACR consensus guidelines:  White Paper of the ACR Incidental Findings Committee II on Vascular Findings. J Am Coll Radiol 2013; 10:789-794. 7. The findings are discussed with Dr. Silverio Lay. Electronically Signed   By: Norva Pavlov M.D.   On: 09/20/2017 22:49     CBC Recent Labs  Lab 09/20/17 1826 09/21/17 0521  WBC 17.9* 9.9  HGB 11.1* 9.4*  HCT 32.2* 28.3*  PLT 398 317  MCV 85.2 86.3  MCH 29.4 28.7  MCHC 34.5 33.2  RDW 13.1 13.1  LYMPHSABS 1.1 1.1  MONOABS 1.5* 0.9  EOSABS 0.0 0.1  BASOSABS 0.0 0.0    Chemistries  Recent Labs  Lab 09/20/17 1826 09/21/17 0208 09/21/17 0926 09/21/17 1344  NA 127* 129* 133* 132*  K 3.6 3.0* 3.1* 2.9*  CL 88* 91* 95* 93*  CO2 27 29 29 30   GLUCOSE 140* 141* 141* 139*  BUN 15 13 11 11   CREATININE 0.62 0.47 0.38* 0.46  CALCIUM 8.5* 8.1* 8.0* 8.0*  AST 27  --   --   --   ALT 16  --   --   --   ALKPHOS 125  --   --   --   BILITOT 0.8  --   --   --    ------------------------------------------------------------------------------------------------------------------ No results for input(s): CHOL, HDL, LDLCALC, TRIG, CHOLHDL, LDLDIRECT in the last 72 hours.  Lab Results  Component Value Date   HGBA1C 5.8 (H) 11/10/2016   ------------------------------------------------------------------------------------------------------------------ No results for input(s): TSH, T4TOTAL, T3FREE, THYROIDAB in the last 72 hours.  Invalid input(s): FREET3 ------------------------------------------------------------------------------------------------------------------ No results for input(s): VITAMINB12, FOLATE, FERRITIN, TIBC, IRON, RETICCTPCT in the last 72 hours.  Coagulation profile Recent Labs  Lab 09/20/17 1826  INR 1.23    No results for input(s): DDIMER in the last 72 hours.  Cardiac Enzymes No results for input(s): CKMB, TROPONINI, MYOGLOBIN in the last 168 hours.  Invalid input(s):  CK ------------------------------------------------------------------------------------------------------------------ No results found for: BNP   Shon Hale M.D on 09/21/2017 at 6:54 PM  Between 7am to 7pm - Pager - [phone]  After 7pm go to [email or web address] - password [email or web address]  [facility] -  Office  [phone]   Voice Recognition Reubin [place] dictation system was used to create this note, attempts have been made to correct errors. Please contact the author with questions and/or clarifications.                                                                                     Patient Demographics:

## 2017-09-21 NOTE — ED Notes (Signed)
ED TO INPATIENT HANDOFF REPORT  Name/Age/Gender [name] 72 y.o. female  Code Status    Code Status Orders  (From admission, onward)        Start     Ordered   09/20/17 2248  Full code  Continuous     09/20/17 2251    Code Status History    Date Active Date Inactive Code Status Order ID Comments User Context   11/06/2016 22:09 11/16/2016 22:03 Full Code [id number]  Benay Pike Inpatient   06/20/2015 18:54 06/21/2015 17:55 Full Code [id number]  Valinda Hoar, PA-C Inpatient   05/03/2015 11:09 05/06/2015 18:35 Full Code [id number]  Melina Schools, MD Inpatient   04/26/2015 23:58 05/03/2015 11:09 Full Code [id number]  Melina Schools, MD Inpatient   08/26/2014 21:46 09/02/2014 15:55 Full Code [id number]  Jani Gravel, MD Inpatient    Advance Directive Documentation     Most Recent Value  Type of Advance Directive  Healthcare Power of Attorney, Living will  Pre-existing out of facility DNR order (yellow form or pink MOST form)  No data  "MOST" Form in Place?  No data      Home/SNF/Other Home  Chief Complaint Not eating, just "sick"  Level of Care/Admitting Diagnosis ED Disposition    ED Disposition Condition [place]: [place] [[id number]]  Level of Care: Telemetry [5]  Diagnosis: Mediastinal abscess [facility]) [[id number]]  Admitting Physician: Vianne Bulls [[id number]]  Attending Physician: Vianne Bulls [[id number]]  Estimated length of stay: past midnight tomorrow  Certification:: I certify this patient will need inpatient services for at least 2 midnights  PT Class (Do Not Modify): Inpatient [101]  PT Acc Code (Do Not Modify): Private [1]       Medical History Past Medical History:  Diagnosis Date  . Asthma   . Breast cancer ([place])    right breast  . COPD (chronic obstructive pulmonary disease) ([place])   . Coronary artery disease   . Diverticulum of esophagus   . Elevated LFTs   . Emphysema lung ([place])   .  ETOH abuse   . H/O atrial fibrillation without current medication    only one time when she had sepsis  . Hyperlipidemia   . Hypertension    hx of but not on any medications  . Myocardial infarction ([place]) 2000  . OA (osteoarthritis) of knee   . Osteoarthritis   . Tobacco abuse     Allergies Allergies  Allergen Reactions  . Cephalexin Other (See Comments)    Took off first layer of skin inside of mouth  . Zithromax [Azithromycin Dihydrate] Other (See Comments)    ORAL ULCERS     IV Location/Drains/Wounds Patient Lines/Drains/Airways Status   Active Line/Drains/Airways    Name:   Placement date:   Placement time:   Site:   Days:   Peripheral IV 09/20/17 Left;Anterior;Lateral Wrist   09/20/17    2102    Wrist   1   Open Drain 1 Anterior Neck 0.5 Fr.   11/08/16    1410    Neck   317   Gastrostomy/Enterostomy Gastrostomy 18 Fr. LUQ   05/04/15    1019    LUQ   871   Gastrostomy/Enterostomy Gastrostomy 18 Fr. LUQ   11/14/16    1500    LUQ   311   Incision (Closed) 05/03/15 Neck Other (Comment)   05/03/15    0941     872  Incision (Closed) 05/04/15 Abdomen Other (Comment)   05/04/15    1042     871   Incision (Closed) 06/20/15 Neck   06/20/15    1546     824   Incision (Closed) 10/22/15 Hip Right   10/22/15    1502     700   Incision (Closed) 11/08/16 Neck Other (Comment)   11/08/16    1424     317   Incision (Closed) 11/13/16 Abdomen Other (Comment)   11/13/16    1533     312   Incision (Closed) 11/14/16 Abdomen Other (Comment)   11/14/16    1502     311          Labs/Imaging Results for orders placed or performed during the hospital encounter of 09/20/17 (from the past 48 hour(s))  Comprehensive metabolic panel     Status: Abnormal   Collection Time: 09/20/17  6:26 PM  Result Value Ref Range   Sodium 127 (L) 135 - 145 mmol/L   Potassium 3.6 3.5 - 5.1 mmol/L   Chloride 88 (L) 101 - 111 mmol/L   CO2 27 22 - 32 mmol/L   Glucose, Bld 140 (H) 65 - 99 mg/dL   BUN 15 6 - 20  mg/dL   Creatinine, Ser 0.62 0.44 - 1.00 mg/dL   Calcium 8.5 (L) 8.9 - 10.3 mg/dL   Total Protein 7.7 6.5 - 8.1 g/dL   Albumin 2.6 (L) 3.5 - 5.0 g/dL   AST 27 15 - 41 U/L   ALT 16 14 - 54 U/L   Alkaline Phosphatase 125 38 - 126 U/L   Total Bilirubin 0.8 0.3 - 1.2 mg/dL   GFR calc non Af Amer >60 >60 mL/min   GFR calc Af Amer >60 >60 mL/min    Comment: (NOTE) The eGFR has been calculated using the CKD EPI equation. This calculation has not been validated in all clinical situations. eGFR's persistently <60 mL/min signify possible Chronic Kidney Disease.    Anion gap 12 5 - 15  CBC with Differential     Status: Abnormal   Collection Time: 09/20/17  6:26 PM  Result Value Ref Range   WBC 17.9 (H) 4.0 - 10.5 K/uL   RBC 3.78 (L) 3.87 - 5.11 MIL/uL   Hemoglobin 11.1 (L) 12.0 - 15.0 g/dL   HCT 32.2 (L) 36.0 - 46.0 %   MCV 85.2 78.0 - 100.0 fL   MCH 29.4 26.0 - 34.0 pg   MCHC 34.5 30.0 - 36.0 g/dL   RDW 13.1 11.5 - 15.5 %   Platelets 398 150 - 400 K/uL   Neutrophils Relative % 86 %   Neutro Abs 15.3 (H) 1.7 - 7.7 K/uL   Lymphocytes Relative 6 %   Lymphs Abs 1.1 0.7 - 4.0 K/uL   Monocytes Relative 8 %   Monocytes Absolute 1.5 (H) 0.1 - 1.0 K/uL   Eosinophils Relative 0 %   Eosinophils Absolute 0.0 0.0 - 0.7 K/uL   Basophils Relative 0 %   Basophils Absolute 0.0 0.0 - 0.1 K/uL  Protime-INR     Status: Abnormal   Collection Time: 09/20/17  6:26 PM  Result Value Ref Range   Prothrombin Time 15.4 (H) 11.4 - 15.2 seconds   INR 1.23   I-Stat CG4 Lactic Acid, ED     Status: None   Collection Time: 09/20/17  6:40 PM  Result Value Ref Range   Lactic Acid, Venous 1.52 0.5 - 1.9 mmol/L  Urinalysis,  Routine w reflex microscopic     Status: Abnormal   Collection Time: 09/20/17 11:42 PM  Result Value Ref Range   Color, Urine YELLOW YELLOW   APPearance CLEAR CLEAR   Specific Gravity, Urine 1.045 (H) 1.005 - 1.030   pH 6.0 5.0 - 8.0   Glucose, UA 50 (A) NEGATIVE mg/dL   Hgb urine  dipstick SMALL (A) NEGATIVE   Bilirubin Urine NEGATIVE NEGATIVE   Ketones, ur NEGATIVE NEGATIVE mg/dL   Protein, ur 100 (A) NEGATIVE mg/dL   Nitrite NEGATIVE NEGATIVE   Leukocytes, UA NEGATIVE NEGATIVE   RBC / HPF 0-5 0 - 5 RBC/hpf   WBC, UA 0-5 0 - 5 WBC/hpf   Bacteria, UA NONE SEEN NONE SEEN   Squamous Epithelial / LPF 0-5 (A) NONE SEEN  Osmolality, urine     Status: None   Collection Time: 09/20/17 11:42 PM  Result Value Ref Range   Osmolality, Ur 444 300 - 900 mOsm/kg    Comment: Performed at [facility], [address], [place], [place] [address]  Sodium, urine, random     Status: None   Collection Time: 09/20/17 11:42 PM  Result Value Ref Range   Sodium, Ur <10 mmol/L    Comment: RESULT REPEATED AND VERIFIED Performed at [facility] [address] [place], [place] [address]   Osmolality     Status: None   Collection Time: 09/21/17 12:30 AM  Result Value Ref Range   Osmolality 279 275 - 295 mOsm/kg    Comment: Performed at [facility] [address], [place], [place] [address]  Basic metabolic panel     Status: Abnormal   Collection Time: 09/21/17  2:08 AM  Result Value Ref Range   Sodium 129 (L) 135 - 145 mmol/L   Potassium 3.0 (L) 3.5 - 5.1 mmol/L   Chloride 91 (L) 101 - 111 mmol/L   CO2 29 22 - 32 mmol/L   Glucose, Bld 141 (H) 65 - 99 mg/dL   BUN 13 6 - 20 mg/dL   Creatinine, Ser 0.47 0.44 - 1.00 mg/dL   Calcium 8.1 (L) 8.9 - 10.3 mg/dL   GFR calc non Af Amer >60 >60 mL/min   GFR calc Af Amer >60 >60 mL/min    Comment: (NOTE) The eGFR has been calculated using the CKD EPI equation. This calculation has not been validated in all clinical situations. eGFR's persistently <60 mL/min signify possible Chronic Kidney Disease.    Anion gap 9 5 - 15  CBC WITH DIFFERENTIAL     Status: Abnormal   Collection Time: 09/21/17  5:21 AM  Result Value Ref Range   WBC 9.9 4.0 - 10.5 K/uL   RBC 3.28 (L) 3.87 - 5.11 MIL/uL   Hemoglobin 9.4 (L) 12.0 -  15.0 g/dL   HCT 28.3 (L) 36.0 - 46.0 %   MCV 86.3 78.0 - 100.0 fL   MCH 28.7 26.0 - 34.0 pg   MCHC 33.2 30.0 - 36.0 g/dL   RDW 13.1 11.5 - 15.5 %   Platelets 317 150 - 400 K/uL   Neutrophils Relative % 78 %   Neutro Abs 7.8 (H) 1.7 - 7.7 K/uL   Lymphocytes Relative 11 %   Lymphs Abs 1.1 0.7 - 4.0 K/uL   Monocytes Relative 10 %   Monocytes Absolute 0.9 0.1 - 1.0 K/uL   Eosinophils Relative 1 %   Eosinophils Absolute 0.1 0.0 - 0.7 K/uL   Basophils Relative 0 %  Basophils Absolute 0.0 0.0 - 0.1 K/uL  CBG monitoring, ED     Status: Abnormal   Collection Time: 09/21/17  8:24 AM  Result Value Ref Range   Glucose-Capillary 119 (H) 65 - 99 mg/dL  Basic metabolic panel     Status: Abnormal   Collection Time: 09/21/17  9:26 AM  Result Value Ref Range   Sodium 133 (L) 135 - 145 mmol/L   Potassium 3.1 (L) 3.5 - 5.1 mmol/L   Chloride 95 (L) 101 - 111 mmol/L   CO2 29 22 - 32 mmol/L   Glucose, Bld 141 (H) 65 - 99 mg/dL   BUN 11 6 - 20 mg/dL   Creatinine, Ser 0.38 (L) 0.44 - 1.00 mg/dL   Calcium 8.0 (L) 8.9 - 10.3 mg/dL   GFR calc non Af Amer >60 >60 mL/min   GFR calc Af Amer >60 >60 mL/min    Comment: (NOTE) The eGFR has been calculated using the CKD EPI equation. This calculation has not been validated in all clinical situations. eGFR's persistently <60 mL/min signify possible Chronic Kidney Disease.    Anion gap 9 5 - 15  Basic metabolic panel     Status: Abnormal   Collection Time: 09/21/17  1:44 PM  Result Value Ref Range   Sodium 132 (L) 135 - 145 mmol/L   Potassium 2.9 (L) 3.5 - 5.1 mmol/L   Chloride 93 (L) 101 - 111 mmol/L   CO2 30 22 - 32 mmol/L   Glucose, Bld 139 (H) 65 - 99 mg/dL   BUN 11 6 - 20 mg/dL   Creatinine, Ser 0.46 0.44 - 1.00 mg/dL   Calcium 8.0 (L) 8.9 - 10.3 mg/dL   GFR calc non Af Amer >60 >60 mL/min   GFR calc Af Amer >60 >60 mL/min    Comment: (NOTE) The eGFR has been calculated using the CKD EPI equation. This calculation has not been validated in  all clinical situations. eGFR's persistently <60 mL/min signify possible Chronic Kidney Disease.    Anion gap 9 5 - 15   Dg Chest 2 View  Result Date: 09/20/2017 CLINICAL DATA:  Weakness.  History of breast cancer. EXAM: CHEST  2 VIEW COMPARISON:  November 08, 2016 FINDINGS: There is a rounded cavitated masslike region in the medial left upper lung. Mild patchy opacity in the left lower lung suspected. No pneumothorax. The right lung is clear. No other definitive changes. IMPRESSION: There is a masslike cavitated region in the medial left upper lung which could represent a cavitated neoplasm or cavitated infectious process. There is also suspicion of patchy opacity throughout the left lower lung. Recommend CT imaging for better evaluation. Electronically Signed   By: Dorise Bullion III M.D   On: 09/20/2017 18:55   Ct Soft Tissue Neck W Contrast  Result Date: 09/21/2017 CLINICAL DATA:  72 y/o F; cough, fever, failure to thrive. History of right breast cancer post mastectomy. EXAM: CT NECK WITH CONTRAST TECHNIQUE: Multidetector CT imaging of the neck was performed using the standard protocol following the bolus administration of intravenous contrast. CONTRAST:  75 cc Isovue-300 COMPARISON:  09/20/2016 CT chest FINDINGS: Pharynx and larynx: Retropharyngeal/prevertebral abscess extending from the C3 level to upper mediastinum continuous with large cavitary abscess in left upper lobe of the lung. 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 Extensive edema is present throughout the upper mediastinum and lower neck soft tissues.  Widely patent airway. Salivary glands: No inflammation, mass, or stone. Thyroid: Normal. Lymph nodes: Extensive mediastinal adenopathy. Vascular: Negative. Limited intracranial: Negative. Visualized orbits: Negative. Mastoids and visualized paranasal sinuses: Clear. Skeleton: C3-C6 interbody fusion.  Multilevel disc and facet degenerative changes greatest at the C3-4 level where there is severe canal stenosis and cord impingement. Upper chest: Left upper lobe cavitary abscess contiguous with upper mediastinum and retropharyngeal/ prevertebral space. Septal thickening and clustered nodules throughout left upper lobe likely representing acute bronchopneumonia. 6 mm nodule in right upper lobe (series 5, image 40). Other: None. IMPRESSION: 1. Large cavitary abscess in left upper lobe of the lung contiguous with the superior mediastinum and prevertebral/ retropharyngeal space. 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 2. Extensive inflammation within upper and anterior mediastinum as well as the lower neck soft tissues. 3. Mediastinal adenopathy. Electronically Signed   By: Kristine Garbe M.D.   On: 09/21/2017 02:12   Ct Chest W Contrast  Result Date: 09/20/2017 CLINICAL DATA:  Nausea and vomiting. Pt is presented by her spouse, both collaborate a report that since her enteral feeding tube was taken out about a week ago, she has not "been doing well, has not had any meaningful PO intake and is progressively worsening. EXAM: CT CHEST, ABDOMEN, AND PELVIS WITH CONTRAST TECHNIQUE: Multidetector CT imaging of the chest, abdomen and pelvis was performed following the standard protocol during bolus administration of intravenous contrast. CONTRAST:  147m ISOVUE-300 IOPAMIDOL (ISOVUE-300) INJECTION 61% COMPARISON:  Chest x-ray 09/20/2017, esophagram 11/11/2016 FINDINGS: CT CHEST FINDINGS Cardiovascular: There is pectus excavatum, accentuating the axis of the heart. Extensive coronary artery calcifications. Mitral calcifications. The thoracic aorta is partially calcified but not aneurysmal. The pulmonary arteries are well opacified accounting for the contrast bolus timing. Mediastinum/Nodes: There is a complex air-debris  collection within the superior mediastinum, posterior to the upper esophagus. Collection measures 5.8 x 2.6 x 7.3 cm and is centered at T1-2. This is immediately adjacent to the left apical cavitary lesion, described below. Lungs/Pleura: Within the left lung apex, there is a thick-walled cavitary lesion which measures 6.3 x 6.7 cm. Air-fluid level is identified within this lesion. This cavitary lesion is contiguous with the mediastinal air -debris collection. There are reticular changes involving the left lung, right middle lobe, and medial right lower lobe. Large airways are patent. There is atelectasis at both lung bases, left greater than right. A small pulmonary nodule is identified in the right upper lobe, measuring 5 mm on image 36 of series 9. Musculoskeletal: Increased sclerosis of incompletely imaged C7, T1, and T2, suspicious for metastatic disease. Degenerative changes are seen in the thoracic and lumbar spine. Bilateral hip arthroplasty. Additional: Right mastectomy. CT ABDOMEN PELVIS FINDINGS Hepatobiliary: There is mild intrahepatic biliary duct dilatation. Gallbladder is distended. Pancreas: Unremarkable. No pancreatic ductal dilatation or surrounding inflammatory changes. Spleen: Normal in size without focal abnormality. Adrenals/Urinary Tract: Normal adrenal glands. Small bilateral renal cysts. Urinary bladder is partially obscured by significant artifact in the pelvis. Stomach/Bowel: Visible tract from previous gastrostomy tube. Otherwise the stomach is normal in appearance. No bowel dilatation. Pelvic loops are partially obscured by metallic artifact. Vascular/Lymphatic: There is atherosclerotic calcification of the abdominal aorta, measuring up to 2.8 cm. No significant adenopathy in the pelvis. Reproductive: Uterus is obscured or absent. Other: None Musculoskeletal: Scoliosis and significant degenerative changes in the thoracolumbar spine. IMPRESSION: 1. Complex air and debris collection  posterior to the esophagus  in the superior mediastinum and contiguous with a large bowel cavitary lesion in the left upper lobe. The findings are consistent with esophageal/phalangeal perforation and abscess formation. 2. Sclerotic vertebral bodies of C7, T1, and T2, suspicious for metastatic disease given the history of right breast cancer. 3. Further evaluation with CT of the neck with intravenous contrast is recommended to evaluate the upper extent of the retroperitoneal abscess. 4. Coronary artery disease. 5.  Aortic atherosclerosis.  (ICD10-I70.0) 6. Abdominal aortic atherosclerosis. Ectatic abdominal aorta at risk for aneurysm development. Recommend followup by ultrasound in 5 years. This recommendation follows ACR consensus guidelines: White Paper of the ACR Incidental Findings Committee II on Vascular Findings. J Am Coll Radiol 2013; 10:789-794. 7. The findings are discussed with Dr. Darl Householder. Electronically Signed   By: Nolon Nations M.D.   On: 09/20/2017 22:49   Ct Abdomen Pelvis W Contrast  Result Date: 09/20/2017 CLINICAL DATA:  Nausea and vomiting. Pt is presented by her spouse, both collaborate a report that since her enteral feeding tube was taken out about a week ago, she has not "been doing well, has not had any meaningful PO intake and is progressively worsening. EXAM: CT CHEST, ABDOMEN, AND PELVIS WITH CONTRAST TECHNIQUE: Multidetector CT imaging of the chest, abdomen and pelvis was performed following the standard protocol during bolus administration of intravenous contrast. CONTRAST:  167m ISOVUE-300 IOPAMIDOL (ISOVUE-300) INJECTION 61% COMPARISON:  Chest x-ray 09/20/2017, esophagram 11/11/2016 FINDINGS: CT CHEST FINDINGS Cardiovascular: There is pectus excavatum, accentuating the axis of the heart. Extensive coronary artery calcifications. Mitral calcifications. The thoracic aorta is partially calcified but not aneurysmal. The pulmonary arteries are well opacified accounting for the contrast  bolus timing. Mediastinum/Nodes: There is a complex air-debris collection within the superior mediastinum, posterior to the upper esophagus. Collection measures 5.8 x 2.6 x 7.3 cm and is centered at T1-2. This is immediately adjacent to the left apical cavitary lesion, described below. Lungs/Pleura: Within the left lung apex, there is a thick-walled cavitary lesion which measures 6.3 x 6.7 cm. Air-fluid level is identified within this lesion. This cavitary lesion is contiguous with the mediastinal air -debris collection. There are reticular changes involving the left lung, right middle lobe, and medial right lower lobe. Large airways are patent. There is atelectasis at both lung bases, left greater than right. A small pulmonary nodule is identified in the right upper lobe, measuring 5 mm on image 36 of series 9. Musculoskeletal: Increased sclerosis of incompletely imaged C7, T1, and T2, suspicious for metastatic disease. Degenerative changes are seen in the thoracic and lumbar spine. Bilateral hip arthroplasty. Additional: Right mastectomy. CT ABDOMEN PELVIS FINDINGS Hepatobiliary: There is mild intrahepatic biliary duct dilatation. Gallbladder is distended. Pancreas: Unremarkable. No pancreatic ductal dilatation or surrounding inflammatory changes. Spleen: Normal in size without focal abnormality. Adrenals/Urinary Tract: Normal adrenal glands. Small bilateral renal cysts. Urinary bladder is partially obscured by significant artifact in the pelvis. Stomach/Bowel: Visible tract from previous gastrostomy tube. Otherwise the stomach is normal in appearance. No bowel dilatation. Pelvic loops are partially obscured by metallic artifact. Vascular/Lymphatic: There is atherosclerotic calcification of the abdominal aorta, measuring up to 2.8 cm. No significant adenopathy in the pelvis. Reproductive: Uterus is obscured or absent. Other: None Musculoskeletal: Scoliosis and significant degenerative changes in the thoracolumbar  spine. IMPRESSION: 1. Complex air and debris collection posterior to the esophagus in the superior mediastinum and contiguous with a large bowel cavitary lesion in the left upper lobe. The findings are consistent with esophageal/phalangeal perforation and abscess  formation. 2. Sclerotic vertebral bodies of C7, T1, and T2, suspicious for metastatic disease given the history of right breast cancer. 3. Further evaluation with CT of the neck with intravenous contrast is recommended to evaluate the upper extent of the retroperitoneal abscess. 4. Coronary artery disease. 5.  Aortic atherosclerosis.  (ICD10-I70.0) 6. Abdominal aortic atherosclerosis. Ectatic abdominal aorta at risk for aneurysm development. Recommend followup by ultrasound in 5 years. This recommendation follows ACR consensus guidelines: White Paper of the ACR Incidental Findings Committee II on Vascular Findings. J Am Coll Radiol 2013; 10:789-794. 7. The findings are discussed with Dr. Darl Householder. Electronically Signed   By: Nolon Nations M.D.   On: 09/20/2017 22:49    Pending Labs Unresulted Labs (From admission, onward)   Start     Ordered   09/27/17 0500  Creatinine, serum  (enoxaparin (LOVENOX)    CrCl >/= 30 ml/min)  Weekly,   R    Comments:  while on enoxaparin therapy    09/20/17 2251   09/22/17 0500  Creatinine, serum  Daily,   R     09/21/17 1237   09/20/17 1815  Culture, blood (Routine x 2)  BLOOD CULTURE X 2,   STAT     09/20/17 1814      Vitals/Pain Today's Vitals   09/21/17 1500 09/21/17 1541 09/21/17 1621 09/21/17 1730  BP: (!) 120/59 (!) 120/97 (!) 119/57 119/62  Pulse: 66 (!) 127 75 71  Resp: (!) 21 (!) 23 (!) 22 18  Temp:      TempSrc:      SpO2: 95%  98% 100%  Weight:      Height:      PainSc:        Isolation Precautions No active isolations  Medications Medications  mometasone-formoterol (DULERA) 200-5 MCG/ACT inhaler 2 puff (2 puffs Inhalation Given 09/21/17 0927)  diclofenac (VOLTAREN) EC tablet 75 mg  (not administered)  gabapentin (NEURONTIN) capsule 600 mg (600 mg Oral Given 09/21/17 1314)  losartan (COZAAR) tablet 25 mg (25 mg Oral Given 09/21/17 1040)  morphine (MS CONTIN) 12 hr tablet 30 mg (30 mg Oral Given 09/21/17 1212)  oxyCODONE (Oxy IR/ROXICODONE) immediate release tablet 15 mg (15 mg Oral Given 09/21/17 1313)  albuterol (PROVENTIL) (2.5 MG/3ML) 0.083% nebulizer solution 2.5 mg (not administered)  enoxaparin (LOVENOX) injection 40 mg (40 mg Subcutaneous Given 09/21/17 0049)  acetaminophen (TYLENOL) tablet 650 mg (650 mg Oral Given 09/21/17 1653)    Or  acetaminophen (TYLENOL) suppository 650 mg ( Rectal See Alternative 09/21/17 1653)  senna-docusate (Senokot-S) tablet 1 tablet (not administered)  ondansetron (ZOFRAN) tablet 4 mg (not administered)    Or  ondansetron (ZOFRAN) injection 4 mg (not administered)  0.9 %  sodium chloride infusion ( Intravenous Stopped 09/21/17 0932)  famotidine (PEPCID) IVPB 20 mg premix (0 mg Intravenous Stopped 09/21/17 1108)  vancomycin (VANCOCIN) IVPB 750 mg/150 ml premix (not administered)  piperacillin-tazobactam (ZOSYN) IVPB 3.375 g (0 g Intravenous Stopped 09/21/17 1713)  vancomycin (VANCOCIN) IVPB 1000 mg/200 mL premix (0 mg Intravenous Stopped 09/20/17 2314)  ceFEPIme (MAXIPIME) 1 g in dextrose 5 % 50 mL IVPB (0 g Intravenous Stopped 09/20/17 2211)  sodium chloride 0.9 % bolus 1,000 mL (0 mLs Intravenous Stopped 09/21/17 0049)  acetaminophen (TYLENOL) tablet 650 mg (650 mg Oral Given 09/20/17 2131)  iopamidol (ISOVUE-300) 61 % injection 100 mL (100 mLs Intravenous Contrast Given 09/20/17 2105)  morphine 4 MG/ML injection 4 mg (4 mg Intravenous Given 09/20/17 2131)  gabapentin (NEURONTIN) capsule 600  mg (600 mg Oral Given 09/20/17 2131)  piperacillin-tazobactam (ZOSYN) IVPB 3.375 g (0 g Intravenous Stopped 09/21/17 0023)  iopamidol (ISOVUE-300) 61 % injection 75 mL (75 mLs Intravenous Contrast Given 09/21/17 0006)    Mobility walks

## 2017-09-21 NOTE — Progress Notes (Signed)
Pharmacy Antibiotic Note  [name] is a 72 y.o. 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  To start vancomycin and zosyn for mediastinal abscess and suspected esophageal perforation.  1/6 CT soft neck tissue: Large cavitary abscess in left upper lobe of the lung contiguous with the superior mediastinum and prevertebral/ retropharyngeal space. Extensive inflammation within upper and anterior mediastinum as well as the lower neck soft tissues  Plan: - continue vancomycin 750 mg IV q24h  - zosyn 3.375 gm IV q8h (over 4 hrs) - daily scr _______________________________  Height: 5\' 2"  (157.5 cm) Weight: 130 lb (59 kg) IBW/kg (Calculated) : 50.1  Temp (24hrs), Avg:98.8 F (37.1 C), Min:98.4 F (36.9 C), Max:100.3 F (37.9 C)  Recent Labs  Lab 09/20/17 1826 09/20/17 1840 09/21/17 0208 09/21/17 0521 09/21/17 0926  WBC 17.9*  --   --  9.9  --   CREATININE 0.62  --  0.47  --  0.38*  LATICACIDVEN  --  1.52  --   --   --     Estimated Creatinine Clearance: 52.5 mL/min (A) (by C-G formula based on SCr of 0.38 mg/dL (L)).    Allergies  Allergen Reactions  . Cephalexin Other (See Comments)    Took off first layer of skin inside of mouth  . Zithromax [Azithromycin Dihydrate] Other (See Comments)    ORAL ULCERS     Thank you for allowing pharmacy to be a part of this patient's care.  Lynelle Doctor 09/21/2017 12:30 PM

## 2017-09-21 NOTE — ED Notes (Signed)
Patient continues to detach herself from monitor and oxygen to get up and walk to the restroom. Patient was 85% on RA when patient was back in room.

## 2017-09-21 NOTE — Progress Notes (Signed)
New Admission Note:   Arrival Method: Arrived from [facility] via [facility] transport. Mental Orientation: Alert and oriented x4 Telemetry: Box #7 Assessment: Completed Skin: See doc flowsheet IV: Lt FA Pain: see doc flowsheet Tubes: N/A Safety Measures: Safety Fall Prevention Plan has been given, discussed. Admission: Completed [facility] Orientation: Patient has been orientated to the room, unit and staff.  Family: None at bedside  Orders have been reviewed and implemented. Will continue to monitor the patient. Call light has been placed within reach and bed alarm has been activated.   [employer], RN-BC Phone number: [phone]

## 2017-09-22 ENCOUNTER — Encounter (HOSPITAL_COMMUNITY): Payer: Self-pay | Admitting: Interventional Radiology

## 2017-09-22 ENCOUNTER — Inpatient Hospital Stay (HOSPITAL_COMMUNITY): Payer: Medicare Other

## 2017-09-22 DIAGNOSIS — L8990 Pressure ulcer of unspecified site, unspecified stage: Secondary | ICD-10-CM

## 2017-09-22 DIAGNOSIS — K223 Perforation of esophagus: Secondary | ICD-10-CM

## 2017-09-22 HISTORY — PX: IR REPLC GASTRO/COLONIC TUBE PERCUT W/FLUORO: IMG2333

## 2017-09-22 LAB — CBC WITH DIFFERENTIAL/PLATELET
Basophils Absolute: 0 10*3/uL (ref 0.0–0.1)
Basophils Relative: 0 %
EOS PCT: 0 %
Eosinophils Absolute: 0 10*3/uL (ref 0.0–0.7)
HEMATOCRIT: 30.5 % — AB (ref 36.0–46.0)
HEMOGLOBIN: 10 g/dL — AB (ref 12.0–15.0)
LYMPHS ABS: 1 10*3/uL (ref 0.7–4.0)
Lymphocytes Relative: 10 %
MCH: 27.9 pg (ref 26.0–34.0)
MCHC: 32.8 g/dL (ref 30.0–36.0)
MCV: 85 fL (ref 78.0–100.0)
MONOS PCT: 8 %
Monocytes Absolute: 0.8 10*3/uL (ref 0.1–1.0)
NEUTROS ABS: 8.2 10*3/uL — AB (ref 1.7–7.7)
Neutrophils Relative %: 82 %
Platelets: 370 10*3/uL (ref 150–400)
RBC: 3.59 MIL/uL — AB (ref 3.87–5.11)
RDW: 12.9 % (ref 11.5–15.5)
WBC: 10 10*3/uL (ref 4.0–10.5)

## 2017-09-22 LAB — COMPREHENSIVE METABOLIC PANEL
ALK PHOS: 100 U/L (ref 38–126)
ALT: 13 U/L — ABNORMAL LOW (ref 14–54)
AST: 16 U/L (ref 15–41)
Albumin: 2 g/dL — ABNORMAL LOW (ref 3.5–5.0)
Anion gap: 15 (ref 5–15)
BILIRUBIN TOTAL: 1 mg/dL (ref 0.3–1.2)
BUN: 6 mg/dL (ref 6–20)
CALCIUM: 8 mg/dL — AB (ref 8.9–10.3)
CO2: 24 mmol/L (ref 22–32)
Chloride: 92 mmol/L — ABNORMAL LOW (ref 101–111)
Creatinine, Ser: 0.46 mg/dL (ref 0.44–1.00)
GFR calc Af Amer: >60 mL/min (ref >60)
Glucose, Bld: 104 mg/dL — ABNORMAL HIGH (ref 65–99)
POTASSIUM: 3.7 mmol/L (ref 3.5–5.1)
Sodium: 131 mmol/L — ABNORMAL LOW (ref 135–145)
TOTAL PROTEIN: 6.5 g/dL (ref 6.5–8.1)

## 2017-09-22 LAB — C-REACTIVE PROTEIN: CRP: 18.6 mg/dL — AB (ref <1.0)

## 2017-09-22 LAB — MAGNESIUM: MAGNESIUM: 1.9 mg/dL (ref 1.7–2.4)

## 2017-09-22 LAB — PROTIME-INR
INR: 1.2
Prothrombin Time: 15.1 seconds (ref 11.4–15.2)

## 2017-09-22 LAB — GLUCOSE, CAPILLARY: Glucose-Capillary: 110 mg/dL — ABNORMAL HIGH (ref 65–99)

## 2017-09-22 MED ORDER — MORPHINE SULFATE (PF) 2 MG/ML IV SOLN
2.0000 mg | INTRAVENOUS | Status: DC | PRN
Start: 2017-09-22 — End: 2017-09-23
  Administered 2017-09-22: 4 mg via INTRAVENOUS
  Filled 2017-09-22: qty 2

## 2017-09-22 MED ORDER — LOPERAMIDE HCL 2 MG PO CAPS: 2.0000 mg | ORAL_CAPSULE | ORAL | Status: DC | PRN

## 2017-09-22 MED ORDER — PIPERACILLIN-TAZOBACTAM 3.375 G IVPB: 3.3750 g | Freq: Three times a day (TID) | INTRAVENOUS | Status: DC

## 2017-09-22 MED ORDER — LIDOCAINE VISCOUS 2 % MT SOLN
OROMUCOSAL | Status: DC | PRN
  Administered 2017-09-22: 5 mL via OROMUCOSAL

## 2017-09-22 MED ORDER — LIDOCAINE VISCOUS 2 % MT SOLN
OROMUCOSAL | Status: AC
  Filled 2017-09-22: qty 15

## 2017-09-22 MED ORDER — CHOLESTYRAMINE LIGHT 4 G PO PACK: 4.0000 g | PACK | Freq: Two times a day (BID) | ORAL | Status: DC

## 2017-09-22 MED ORDER — IOPAMIDOL (ISOVUE-300) INJECTION 61%
INTRAVENOUS | Status: AC
  Administered 2017-09-22: 15 mL
  Filled 2017-09-22: qty 50

## 2017-09-22 MED ORDER — HEPARIN SOD (PORK) LOCK FLUSH 100 UNIT/ML IV SOLN
250.0000 [IU] | Freq: Every day | INTRAVENOUS | Status: DC
  Filled 2017-09-22: qty 2.5

## 2017-09-22 MED ORDER — FLUCONAZOLE 100MG IVPB: 100.0000 mg | INTRAVENOUS | Status: DC

## 2017-09-22 MED ORDER — FLUCONAZOLE 100MG IVPB
100.0000 mg | INTRAVENOUS | Status: DC
  Administered 2017-09-22: 100 mg via INTRAVENOUS
  Filled 2017-09-22: qty 50

## 2017-09-22 MED ORDER — CHOLESTYRAMINE LIGHT 4 G PO PACK
4.0000 g | PACK | Freq: Two times a day (BID) | ORAL | Status: DC
  Filled 2017-09-22: qty 1

## 2017-09-22 MED ORDER — VANCOMYCIN HCL IN DEXTROSE 750-5 MG/150ML-% IV SOLN: 750.0000 mg | INTRAVENOUS | Status: DC

## 2017-09-22 MED ORDER — SODIUM CHLORIDE 0.9% FLUSH
10.0000 mL | INTRAVENOUS | Status: DC | PRN
  Administered 2017-09-22: 20 mL
  Filled 2017-09-22: qty 40

## 2017-09-22 MED ORDER — HEPARIN SOD (PORK) LOCK FLUSH 100 UNIT/ML IV SOLN
250.0000 [IU] | INTRAVENOUS | Status: DC | PRN
  Filled 2017-09-22: qty 3

## 2017-09-22 NOTE — Progress Notes (Signed)
[address]       [place] [address]             [phone]        [name] Medical Record #[id number] Date of Birth: [birth year]  Referring: Dr. Mitzi Hansen, MD Primary Care: Vernie Shanks, MD Primary Cardiologist: Dr. Martinique, MD  Chief Complaint:    Chief Complaint  Patient presents with  . Failure To Thrive . Anorexia  . Not Feeling Well  . 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 She was most recently hospitalized at [facility] from 2/22 to 11/14/2016. During this hospitalization, she had a direct laryngoscopy and placement of a nasogastric feeding tube and exploration of the left neck with I and D of prevertebral abscess  (by Dr. Wilburn Cornelia on 11/08/2016), a gastrostomy tube placed by Dr. Rush Farmer (on 11/13/2016 then replacement as ruptured balloon 11/14/2016), was given tube feedings, had PICC placed for IV Vancomycin for neck abscess and cervical/thoracic osteomyelitis. She was discharged in stable condition with arrangement for IV Vancomycin and Meropenem on 11/16/2016. She apparently finished the IV antibiotics on 01/08/2017.   Her gastrostomy tube "fell out" on 06/05/2017. It was replaced on this date and she was discharged from [facility] ED.  According to medical records, she was seen by ENT surgeon who allowed her to take more food by mouth as she was clinically improving. She was last seen by infectious disease in November of 2018. Most recently, she was on oral Augmentin for prolonged treatment.   She presented to [facility] on 09/20/2017 with complaints of not taking much by mouth, fever, chills and becoming weaker. Admitting labs showed hyponatremia (sodium 127), hypoalbuminemia  (2.6), leukocytosis (WBC 17,900). CT of the chest showed complex air and debris collection posterior to the esophagus in the superior mediastinum and contiguous with a large bowel cavitary lesion in the left upper lobe. The findings are consistent with esophageal/phalangeal perforation and abscess formation. CT scan of the neck showed a large cavitary abscess in left upper lobe of the lung contiguous with the superior mediastinum and prevertebral/ retropharyngeal space. 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  There was also extensive inflammation within upper and anterior mediastinum as well as the lower neck soft tissues and mediastinal adenopathy. She was given fluids and started on Cefepime and Vancomycin. She was then transferred to [facility] from [place]. Currently, she is NPO, on Zosyn and Diflucan.   Current Activity/ Functional Status: Patient is independent with mobility/ambulation, transfers, ADL's, IADL's.   Zubrod Score: At the time of surgery this patient's most appropriate activity status/level should be described as: []     0    Normal activity, no symptoms []     1    Restricted in physical strenuous activity but ambulatory, able to do out light work [x]     2    Ambulatory and capable of self care, unable to do work activities, up and about more than 50% of the time                            []   09/22/2017 7:27 AM     Patient ID: [name], female   DOB: 1946-07-30, 72 y.o.   MRN: [id number]  09/22/2017 7:27 AM     Patient ID: [name], female   DOB: 1946-07-30, 72 y.o.   MRN: [id number]  09/22/2017 7:27 AM     Patient ID: [name], female   DOB: 1946-07-30, 72 y.o.   MRN: [id number]  09/22/2017 7:27 AM     Patient ID: [name], female   DOB: 1946-07-30, 72 y.o.   MRN: [id number]  09/22/2017 7:27 AM     Patient ID: [name], female   DOB: 1946-07-30, 72 y.o.   MRN: [id number]  [address]       [place] [address]             [phone]        [name] Medical Record #[id number] Date of Birth: [birth year]  Referring: Dr. Mitzi Hansen, MD Primary Care: Vernie Shanks, MD Primary Cardiologist: Dr. Martinique, MD  Chief Complaint:    Chief Complaint  Patient presents with  . Failure To Thrive . Anorexia  . Not Feeling Well  . 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 She was most recently hospitalized at [facility] from 2/22 to 11/14/2016. During this hospitalization, she had a direct laryngoscopy and placement of a nasogastric feeding tube and exploration of the left neck with I and D of prevertebral abscess  (by Dr. Wilburn Cornelia on 11/08/2016), a gastrostomy tube placed by Dr. Rush Farmer (on 11/13/2016 then replacement as ruptured balloon 11/14/2016), was given tube feedings, had PICC placed for IV Vancomycin for neck abscess and cervical/thoracic osteomyelitis. She was discharged in stable condition with arrangement for IV Vancomycin and Meropenem on 11/16/2016. She apparently finished the IV antibiotics on 01/08/2017.   Her gastrostomy tube "fell out" on 06/05/2017. It was replaced on this date and she was discharged from [facility] ED.  According to medical records, she was seen by ENT surgeon who allowed her to take more food by mouth as she was clinically improving. She was last seen by infectious disease in November of 2018. Most recently, she was on oral Augmentin for prolonged treatment.   She presented to [facility] on 09/20/2017 with complaints of not taking much by mouth, fever, chills and becoming weaker. Admitting labs showed hyponatremia (sodium 127), hypoalbuminemia  (2.6), leukocytosis (WBC 17,900). CT of the chest showed complex air and debris collection posterior to the esophagus in the superior mediastinum and contiguous with a large bowel cavitary lesion in the left upper lobe. The findings are consistent with esophageal/phalangeal perforation and abscess formation. CT scan of the neck showed a large cavitary abscess in left upper lobe of the lung contiguous with the superior mediastinum and prevertebral/ retropharyngeal space. 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  There was also extensive inflammation within upper and anterior mediastinum as well as the lower neck soft tissues and mediastinal adenopathy. She was given fluids and started on Cefepime and Vancomycin. She was then transferred to [facility] from [place]. Currently, she is NPO, on Zosyn and Diflucan.   Current Activity/ Functional Status: Patient is independent with mobility/ambulation, transfers, ADL's, IADL's.   Zubrod Score: At the time of surgery this patient's most appropriate activity status/level should be described as: []     0    Normal activity, no symptoms []     1    Restricted in physical strenuous activity but ambulatory, able to do out light work [x]     2    Ambulatory and capable of self care, unable to do work activities, up and about more than 50% of the time                            []   09/22/2017 7:27 AM     Patient ID: [name], female   DOB: 1946-07-30, 72 y.o.   MRN: [id number]

## 2017-09-22 NOTE — Progress Notes (Signed)
Spoke with Dr. Posey Pronto about continuing NPO status with meds and he said that since ENT have stated it is ok for her to have medications orally that we can continue to give meds with sips of water.

## 2017-09-22 NOTE — Consult Note (Signed)
[facility] Surgery Consult Note  [name] 03/09/1946  [id number].    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 She had a G-tube placed in 04/2015 and it was removed sometime that October.  Most recent hospitalization at [facility] 11/06/16 to 11/14/2016. During this hospitalization she had a direct laryngoscopy and placement of a nasogastric feeding tube and exploration of the left neck with I&D of prevertebral abscess (by Dr. Annalee Genta on 11/08/2016), a G-tube placed by Dr. Rayburn Ma 3/1 and 3/2 when the balloon broke; she was given tube feedings, had PICC placed for IV Vancomycin for neck abscess and osteomyelitis. Patient has since been followed by Dr. Manson Passey of ENT at [facility]; she underwent Left neck exploration, excision of Zenker's diverticulum and primary repair, sternocleidomastoid flap 02/03/17. Last office visit was 09/16/17 where patient was doing well and the G-tube was removed  States that for that last 2 weeks she has had gradually worsening fever, chills, and anorexia. Last regular meal was about 2 weeks ago. States that 2-3 days ago she was able to drink 2 Ensures. Upon presentation in the ED patient was found to be hyponatremic NA 127, albumin 2.6, WBC 17.9. CT chest shows complex air and debris collection posterior to the esophagus in the superior mediastinum and contiguous with a large bowel cavitary lesion in  the left upper lobe. The findings are consistent with esophageal/phalangeal perforation and abscess formation. CT scan of the neck shows a large cavitary abscess in left upper lobe of the lung contiguous with the superior mediastinum and prevertebral/ retropharyngeal space. 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  There was also extensive inflammation within upper and anterior mediastinum as well as the lower neck soft tissues and mediastinal adenopathy. Patient started on zosyn, vancomycin, and diflucan. She is NPO. CVTS following.  Abdominal surgical history: multiple G-tube placements, 3 cesarean sections, hysterectomy Lives at home with husband  ROS: Review of Systems  Constitutional: Positive for chills, fever and malaise/fatigue.  HENT: Negative.   Eyes: Negative.   Respiratory: Negative.   Cardiovascular: Negative.   Gastrointestinal: Negative for abdominal pain, nausea and vomiting.       Anorexia  Genitourinary: Negative.   Musculoskeletal: Negative.   Skin: Negative.   Neurological: Positive for weakness.   All systems reviewed and otherwise negative except for as above  Family History  Problem Relation Age of Onset  . Diabetes Mother   . Hypertension Father   . Heart disease Father   . Heart attack Father   . Stroke Father     Past Medical History:  Diagnosis Date  . Asthma   . Breast cancer (HCC)    right breast  . COPD (chronic obstructive pulmonary disease) (HCC)   . Coronary artery disease   . Diverticulum of esophagus   . Elevated LFTs   . Emphysema lung (HCC)   . ETOH abuse   . H/O atrial fibrillation without current medication  only one time when she had sepsis  . Hyperlipidemia   . Hypertension    hx of but not on any medications  . Myocardial infarction (HCC) 2000  . OA (osteoarthritis) of knee   . Osteoarthritis   . Tobacco abuse     Past Surgical  History:  Procedure Laterality Date  . ANTERIOR HIP REVISION Right 10/22/2015   Procedure: RIGHT  HIP REVISION;  Surgeon: Durene Romans, MD;  Location: [facility]S;  Service: Orthopedics;  Laterality: Right;  . APPLICATION OF WOUND VAC N/A 06/20/2015   Procedure: APPLICATION OF INCISIONAL WOUND VAC;  Surgeon: Venita Lick, MD;  Location: [facility] OR;  Service: Orthopedics;  Laterality: N/A;  . BREAST SURGERY  1991   right mastectomy  . CARDIAC CATHETERIZATION  04/05/2009   EF 60%  . CARDIOVASCULAR STRESS TEST  01/31/2005   EF 58%  . CESAREAN SECTION  '78, '80, '81   x 3  . CORONARY ANGIOPLASTY  08/1998   x2 OF A BIFURCATION OM-1, OM-2 LESION  . CORONARY ANGIOPLASTY WITH STENT PLACEMENT  01/1999   MID FIRST OBTUSE MARGINAL VESSEL  . CORONARY ANGIOPLASTY WITH STENT PLACEMENT  07/1999   STENTING AT THE CRUX OF THE RIGHT CORONARY ARTERY WITH A 3.8MM X TETRA STENT  . DIRECT LARYNGOSCOPY N/A 05/03/2015   Procedure: DIRECT LARYNGOSCOPY;  Surgeon: Flo Shanks, MD;  Location: [facility] OR;  Service: ENT;  Laterality: N/A;  . EYE SURGERY  05/18/2014,06/01/2014   BILATERAL CATARACT S WITH LENS IMPLANTS  . GASTROSTOMY N/A 05/04/2015   Procedure: OPEN GASTROSTOMY WITH TUBE PLACEMENT;  Surgeon: Manus Rudd, MD;  Location: [facility] OR;  Service: General;  Laterality: N/A;  . GASTROSTOMY N/A 11/13/2016   Procedure: INSERTION OF GASTROSTOMY TUBE;  Surgeon: Abigail Miyamoto, MD;  Location: [facility] OR;  Service: General;  Laterality: N/A;  . HARDWARE REMOVAL N/A 05/03/2015   Procedure: HARDWARE REMOVAL;  Surgeon: Venita Lick, MD;  Location: [facility] OR;  Service: Orthopedics;  Laterality: N/A;  . HIP CLOSED REDUCTION Right 04/26/2015   Procedure: CLOSED REDUCTION HIP;  Surgeon: Venita Lick, MD;  Location: [facility];  Service: Orthopedics;  Laterality: Right;  . HYSTEROSCOPY     D & C  . INCISION AND DRAINAGE ABSCESS N/A 05/03/2015   Procedure: INCISION AND DRAINAGE CERVICAL  ABSCESS AND REMOVAL OF HARDWARE;  Surgeon: Venita Lick, MD;   Location: [facility] OR;  Service: Orthopedics;  Laterality: N/A;  . JOINT REPLACEMENT  08/2011   bilateral hip  . JOINT REPLACEMENT  01/2012   right hip  . MASTECTOMY    . neck fusion  2011  . PELVIC LAPAROSCOPY  2002   RSO-    . RADICAL NECK DISSECTION N/A 11/08/2016   Procedure: INCISION AND DRAINAGE OF NECK ABSCESS;  Surgeon: Osborn Coho, MD;  Location: [facility] OR;  Service: ENT;  Laterality: N/A;  . RADIOLOGY WITH ANESTHESIA Right 06/28/2015   Procedure: MRI OF CERVICAL SPINE  AND RIGHT HIP  WITH AND WITHOUT CONTRAST    (RADIOLOGY WITH ANESTHESIA);  Surgeon: Medication Radiologist, MD;  Location: [facility] OR;  Service: Radiology;  Laterality: Right;  . REMOVAL OF GASTROSTOMY TUBE N/A 11/14/2016   Procedure: REMOVAL OF GASTROSTOMY TUBE W/ REPLACEMENT OF GASTROSTOMY TUBE;  Surgeon: Abigail Miyamoto, MD;  Location: [facility] OR;  Service: General;  Laterality: N/A;  . RIGID ESOPHAGOSCOPY N/A 05/03/2015   Procedure: RIGID ESOPHAGOSCOPY;  Surgeon: Flo Shanks, MD;  Location: [facility] OR;  Service: ENT;  Laterality: N/A;  . TONSILLECTOMY AND ADENOIDECTOMY    .  TOTAL HIP ARTHROPLASTY  08/2010   bilat  . VULVECTOMY  1981   partial    Social History:  reports that she has quit smoking. Her smoking use included cigarettes. She has a 50.00 pack-year smoking history. she has never used smokeless tobacco. She reports that she does not drink alcohol or use drugs.  Allergies:  Allergies  Allergen Reactions  . Cephalexin Other (See Comments)    Took off first layer of skin inside of mouth  . Zithromax [Azithromycin Dihydrate] Other (See Comments)    ORAL ULCERS     Medications Prior to Admission  Medication Sig Dispense Refill  . albuterol (PROVENTIL HFA;VENTOLIN HFA) 108 (90 BASE) MCG/ACT inhaler Inhale 2 puffs into the lungs every 6 (six) hours as needed for wheezing.     Marland Kitchen amoxicillin-clavulanate (AUGMENTIN) 875-125 MG tablet Take 1 tablet by mouth 2 (two) times daily. 60 tablet 11  . diclofenac (VOLTAREN) 75 MG  EC tablet Take 75 mg by mouth daily as needed for moderate pain.     . furosemide (LASIX) 40 MG tablet TAKE 1 TABLET BY MOUTH TWICE A DAY (Patient taking differently: TAKE 1 TABLET BY MOUTH ONCE DAILY) 30 tablet 0  . gabapentin (NEURONTIN) 600 MG tablet Take 600 mg by mouth 4 (four) times daily.     Marland Kitchen losartan (COZAAR) 25 MG tablet Take 25 mg by mouth daily.   1  . morphine (MS CONTIN) 30 MG 12 hr tablet Take 30 mg by mouth 4 (four) times daily.     Marland Kitchen oxyCODONE (ROXICODONE) 15 MG immediate release tablet Take 15 mg by mouth 4 (four) times daily.    Marland Kitchen ALPRAZolam (XANAX) 0.25 MG tablet Take one tablet daily or as needed (Patient not taking: Reported on 09/20/2017) 90 tablet 3  . budesonide-formoterol (SYMBICORT) 160-4.5 MCG/ACT inhaler Inhale 2 puffs into the lungs 2 (two) times daily as needed (Wheezing). Reported on 10/16/2015      Prior to Admission medications   Medication Sig Start Date End Date Taking? Authorizing Provider  albuterol (PROVENTIL HFA;VENTOLIN HFA) 108 (90 BASE) MCG/ACT inhaler Inhale 2 puffs into the lungs every 6 (six) hours as needed for wheezing.    Yes [provider]  amoxicillin-clavulanate (AUGMENTIN) 875-125 MG tablet Take 1 tablet by mouth 2 (two) times daily. 06/22/17  Yes Judyann Munson, MD  diclofenac (VOLTAREN) 75 MG EC tablet Take 75 mg by mouth daily as needed for moderate pain.  11/01/16  Yes [provider]  furosemide (LASIX) 40 MG tablet TAKE 1 TABLET BY MOUTH TWICE A DAY Patient taking differently: TAKE 1 TABLET BY MOUTH ONCE DAILY 05/20/17  Yes [place], Peter M, MD  gabapentin (NEURONTIN) 600 MG tablet Take 600 mg by mouth 4 (four) times daily.    Yes [provider]  losartan (COZAAR) 25 MG tablet Take 25 mg by mouth daily.  04/04/15  Yes [provider]  morphine (MS CONTIN) 30 MG 12 hr tablet Take 30 mg by mouth 4 (four) times daily.  12/23/16  Yes [provider]  oxyCODONE (ROXICODONE) 15 MG immediate release tablet  Take 15 mg by mouth 4 (four) times daily.   Yes [provider]  ALPRAZolam Prudy Feeler) 0.25 MG tablet Take one tablet daily or as needed Patient not taking: Reported on 09/20/2017 05/14/11   [place], Peter M, MD  budesonide-formoterol [facility]) 160-4.5 MCG/ACT inhaler Inhale 2 puffs into the lungs 2 (two) times daily as needed (Wheezing). Reported on 10/16/2015    [provider]    Blood pressure (!) 144/68, pulse 68, temperature 98.4 F (36.9 C), temperature source Oral, resp. rate 18, height 5\' 2"  (1.575 m), weight 134 lb 3.2 oz (60.9 kg), SpO2 94 %. Physical Exam: General: pleasant, WD/WN white female who is laying in bed in NAD HEENT: head is normocephalic, atraumatic.  Sclera are noninjected.  Pupils equal and round.  Ears and nose without any masses or lesions.  Mouth is dry. Edentate. Trachea midline with scar on right anterior cdi Heart: regular, rate, and rhythm.  No obvious murmurs, gallops, or rubs noted.  Palpable pedal pulses bilaterally Lungs: CTAB, no wheezes, rhonchi, or rales noted.  Respiratory effort nonlabored MS: all 4 extremities are symmetrical with no cyanosis or clubbing. Mild BLE edema. Calves soft and nontender Skin: warm and dry with no masses, lesions, or rashes Psych: A&Ox3 with an appropriate affect. Neuro: cranial nerves grossly intact, extremity CSM intact bilaterally, normal speech Abd: soft, NT/ND, +BS, no masses, hernias, or organomegaly. Previous G-tube side open and draining clear/brown fluid and has surrounding erythema, no fluctuance     Results for orders placed or performed during the hospital encounter of 09/20/17 (from the past 48 hour(s))  Comprehensive metabolic panel     Status: Abnormal   Collection Time: 09/20/17  6:26 PM  Result Value Ref Range   Sodium 127 (L) 135 - 145 mmol/L   Potassium 3.6 3.5 - 5.1 mmol/L   Chloride 88 (L) 101 - 111 mmol/L   CO2 27 22 - 32 mmol/L   Glucose, Bld 140 (H) 65 - 99 mg/dL   BUN 15 6 - 20  mg/dL   Creatinine, Ser 1.47 0.44 - 1.00 mg/dL   Calcium 8.5 (L) 8.9 - 10.3 mg/dL   Total Protein 7.7 6.5 - 8.1 g/dL   Albumin 2.6 (L) 3.5 - 5.0 g/dL   AST 27 15 - 41 U/L   ALT 16 14 - 54 U/L   Alkaline Phosphatase 125 38 - 126 U/L   Total Bilirubin 0.8 0.3 - 1.2 mg/dL   GFR calc non Af Amer >60 >60 mL/min   GFR calc Af Amer >60 >60 mL/min    Comment: (NOTE) The eGFR has been calculated using the CKD EPI equation. This calculation has not been validated in all clinical situations. eGFR's persistently <60 mL/min signify possible Chronic Kidney Disease.    Anion gap 12 5 - 15  CBC with Differential     Status: Abnormal   Collection Time: 09/20/17  6:26 PM  Result Value Ref Range   WBC 17.9 (H) 4.0 - 10.5 K/uL   RBC 3.78 (L) 3.87 - 5.11 MIL/uL   Hemoglobin 11.1 (L) 12.0 - 15.0 g/dL   HCT 82.9 (L) 56.2 - 13.0 %   MCV 85.2 78.0 - 100.0 fL   MCH 29.4 26.0 - 34.0 pg   MCHC 34.5 30.0 - 36.0 g/dL   RDW 86.5 78.4 - 69.6 %   Platelets 398 150 - 400 K/uL   Neutrophils Relative % 86 %   Neutro Abs 15.3 (H) 1.7 - 7.7 K/uL   Lymphocytes Relative 6 %   Lymphs Abs 1.1 0.7 - 4.0 K/uL   Monocytes Relative 8 %   Monocytes Absolute 1.5 (H) 0.1 - 1.0 K/uL   Eosinophils Relative 0 %   Eosinophils Absolute 0.0 0.0 - 0.7 K/uL   Basophils Relative 0 %   Basophils Absolute 0.0 0.0 - 0.1 K/uL  Protime-INR     Status: Abnormal  Collection Time: 09/20/17  6:26 PM  Result Value Ref Range   Prothrombin Time 15.4 (H) 11.4 - 15.2 seconds   INR 1.23   I-Stat CG4 Lactic Acid, ED     Status: None   Collection Time: 09/20/17  6:40 PM  Result Value Ref Range   Lactic Acid, Venous 1.52 0.5 - 1.9 mmol/L  Urinalysis, Routine w reflex microscopic     Status: Abnormal   Collection Time: 09/20/17 11:42 PM  Result Value Ref Range   Color, Urine YELLOW YELLOW   APPearance CLEAR CLEAR   Specific Gravity, Urine 1.045 (H) 1.005 - 1.030   pH 6.0 5.0 - 8.0   Glucose, UA 50 (A) NEGATIVE mg/dL   Hgb urine  dipstick SMALL (A) NEGATIVE   Bilirubin Urine NEGATIVE NEGATIVE   Ketones, ur NEGATIVE NEGATIVE mg/dL   Protein, ur 829 (A) NEGATIVE mg/dL   Nitrite NEGATIVE NEGATIVE   Leukocytes, UA NEGATIVE NEGATIVE   RBC / HPF 0-5 0 - 5 RBC/hpf   WBC, UA 0-5 0 - 5 WBC/hpf   Bacteria, UA NONE SEEN NONE SEEN   Squamous Epithelial / LPF 0-5 (A) NONE SEEN  Osmolality, urine     Status: None   Collection Time: 09/20/17 11:42 PM  Result Value Ref Range   Osmolality, Ur 444 300 - 900 mOsm/kg    Comment: Performed at [facility], [address] [place], [place] [address]  Sodium, urine, random     Status: None   Collection Time: 09/20/17 11:42 PM  Result Value Ref Range   Sodium, Ur <10 mmol/L    Comment: RESULT REPEATED AND VERIFIED Performed at [facility], [address], [place], [place] [address]   Osmolality     Status: None   Collection Time: 09/21/17 12:30 AM  Result Value Ref Range   Osmolality 279 275 - 295 mOsm/kg    Comment: Performed at [facility], [address] [place], [place] [address]  Basic metabolic panel     Status: Abnormal   Collection Time: 09/21/17  2:08 AM  Result Value Ref Range   Sodium 129 (L) 135 - 145 mmol/L   Potassium 3.0 (L) 3.5 - 5.1 mmol/L   Chloride 91 (L) 101 - 111 mmol/L   CO2 29 22 - 32 mmol/L   Glucose, Bld 141 (H) 65 - 99 mg/dL   BUN 13 6 - 20 mg/dL   Creatinine, Ser 2.95 0.44 - 1.00 mg/dL   Calcium 8.1 (L) 8.9 - 10.3 mg/dL   GFR calc non Af Amer >60 >60 mL/min   GFR calc Af Amer >60 >60 mL/min    Comment: (NOTE) The eGFR has been calculated using the CKD EPI equation. This calculation has not been validated in all clinical situations. eGFR's persistently <60 mL/min signify possible Chronic Kidney Disease.    Anion gap 9 5 - 15  CBC WITH DIFFERENTIAL     Status: Abnormal   Collection Time: 09/21/17  5:21 AM  Result Value Ref Range   WBC 9.9 4.0 - 10.5 K/uL   RBC 3.28 (L) 3.87 - 5.11 MIL/uL   Hemoglobin 9.4 (L) 12.0 -  15.0 g/dL   HCT 28.4 (L) 13.2 - 44.0 %   MCV 86.3 78.0 - 100.0 fL   MCH 28.7 26.0 - 34.0 pg   MCHC 33.2 30.0 - 36.0 g/dL   RDW 10.2 72.5 - 36.6 %   Platelets 317 150 - 400 K/uL   Neutrophils Relative %  78 %   Neutro Abs 7.8 (H) 1.7 - 7.7 K/uL   Lymphocytes Relative 11 %   Lymphs Abs 1.1 0.7 - 4.0 K/uL   Monocytes Relative 10 %   Monocytes Absolute 0.9 0.1 - 1.0 K/uL   Eosinophils Relative 1 %   Eosinophils Absolute 0.1 0.0 - 0.7 K/uL   Basophils Relative 0 %   Basophils Absolute 0.0 0.0 - 0.1 K/uL  CBG monitoring, ED     Status: Abnormal   Collection Time: 09/21/17  8:24 AM  Result Value Ref Range   Glucose-Capillary 119 (H) 65 - 99 mg/dL  Basic metabolic panel     Status: Abnormal   Collection Time: 09/21/17  9:26 AM  Result Value Ref Range   Sodium 133 (L) 135 - 145 mmol/L   Potassium 3.1 (L) 3.5 - 5.1 mmol/L   Chloride 95 (L) 101 - 111 mmol/L   CO2 29 22 - 32 mmol/L   Glucose, Bld 141 (H) 65 - 99 mg/dL   BUN 11 6 - 20 mg/dL   Creatinine, Ser 9.14 (L) 0.44 - 1.00 mg/dL   Calcium 8.0 (L) 8.9 - 10.3 mg/dL   GFR calc non Af Amer >60 >60 mL/min   GFR calc Af Amer >60 >60 mL/min    Comment: (NOTE) The eGFR has been calculated using the CKD EPI equation. This calculation has not been validated in all clinical situations. eGFR's persistently <60 mL/min signify possible Chronic Kidney Disease.    Anion gap 9 5 - 15  Basic metabolic panel     Status: Abnormal   Collection Time: 09/21/17  1:44 PM  Result Value Ref Range   Sodium 132 (L) 135 - 145 mmol/L   Potassium 2.9 (L) 3.5 - 5.1 mmol/L   Chloride 93 (L) 101 - 111 mmol/L   CO2 30 22 - 32 mmol/L   Glucose, Bld 139 (H) 65 - 99 mg/dL   BUN 11 6 - 20 mg/dL   Creatinine, Ser 7.82 0.44 - 1.00 mg/dL   Calcium 8.0 (L) 8.9 - 10.3 mg/dL   GFR calc non Af Amer >60 >60 mL/min   GFR calc Af Amer >60 >60 mL/min    Comment: (NOTE) The eGFR has been calculated using the CKD EPI equation. This calculation has not been validated in  all clinical situations. eGFR's persistently <60 mL/min signify possible Chronic Kidney Disease.    Anion gap 9 5 - 15  Glucose, capillary     Status: Abnormal   Collection Time: 09/22/17  7:33 AM  Result Value Ref Range   Glucose-Capillary 110 (H) 65 - 99 mg/dL  CBC with Differential/Platelet     Status: Abnormal (Preliminary result)   Collection Time: 09/22/17  8:11 AM  Result Value Ref Range   WBC PENDING 4.0 - 10.5 K/uL   RBC 3.59 (L) 3.87 - 5.11 MIL/uL   Hemoglobin 10.0 (L) 12.0 - 15.0 g/dL   HCT 95.6 (L) 21.3 - 08.6 %   MCV 85.0 78.0 - 100.0 fL   MCH 27.9 26.0 - 34.0 pg   MCHC 32.8 30.0 - 36.0 g/dL   RDW 57.8 46.9 - 62.9 %   Platelets PENDING 150 - 400 K/uL   Neutrophils Relative % PENDING %   Neutro Abs PENDING 1.7 - 7.7 K/uL   Band Neutrophils PENDING %   Lymphocytes Relative PENDING %   Lymphs Abs PENDING 0.7 - 4.0 K/uL   Monocytes Relative PENDING %   Monocytes Absolute PENDING 0.1 - 1.0  K/uL   Eosinophils Relative PENDING %   Eosinophils Absolute PENDING 0.0 - 0.7 K/uL   Basophils Relative PENDING %   Basophils Absolute PENDING 0.0 - 0.1 K/uL   WBC Morphology PENDING    RBC Morphology PENDING    Smear Review PENDING    nRBC PENDING 0 /100 WBC   Metamyelocytes Relative PENDING %   Myelocytes PENDING %   Promyelocytes Absolute PENDING %   Blasts PENDING %  Comprehensive metabolic panel     Status: Abnormal   Collection Time: 09/22/17  8:11 AM  Result Value Ref Range   Sodium 131 (L) 135 - 145 mmol/L   Potassium 3.7 3.5 - 5.1 mmol/L   Chloride 92 (L) 101 - 111 mmol/L   CO2 24 22 - 32 mmol/L   Glucose, Bld 104 (H) 65 - 99 mg/dL   BUN 6 6 - 20 mg/dL   Creatinine, Ser 4.13 0.44 - 1.00 mg/dL   Calcium 8.0 (L) 8.9 - 10.3 mg/dL   Total Protein 6.5 6.5 - 8.1 g/dL   Albumin 2.0 (L) 3.5 - 5.0 g/dL   AST 16 15 - 41 U/L   ALT 13 (L) 14 - 54 U/L   Alkaline Phosphatase 100 38 - 126 U/L   Total Bilirubin 1.0 0.3 - 1.2 mg/dL   GFR calc non Af Amer >60 >60 mL/min    GFR calc Af Amer >60 >60 mL/min    Comment: (NOTE) The eGFR has been calculated using the CKD EPI equation. This calculation has not been validated in all clinical situations. eGFR's persistently <60 mL/min signify possible Chronic Kidney Disease.    Anion gap 15 5 - 15  Magnesium     Status: None   Collection Time: 09/22/17  8:11 AM  Result Value Ref Range   Magnesium 1.9 1.7 - 2.4 mg/dL  Protime-INR     Status: None   Collection Time: 09/22/17  8:11 AM  Result Value Ref Range   Prothrombin Time 15.1 11.4 - 15.2 seconds   INR 1.20    Dg Chest 2 View  Result Date: 09/20/2017 CLINICAL DATA:  Weakness.  History of breast cancer. EXAM: CHEST  2 VIEW COMPARISON:  November 08, 2016 FINDINGS: There is a rounded cavitated masslike region in the medial left upper lung. Mild patchy opacity in the left lower lung suspected. No pneumothorax. The right lung is clear. No other definitive changes. IMPRESSION: There is a masslike cavitated region in the medial left upper lung which could represent a cavitated neoplasm or cavitated infectious process. There is also suspicion of patchy opacity throughout the left lower lung. Recommend CT imaging for better evaluation. Electronically Signed   By: Gerome Sam III M.D   On: 09/20/2017 18:55   Ct Soft Tissue Neck W Contrast  Result Date: 09/21/2017 CLINICAL DATA:  72 y/o F; cough, fever, failure to thrive. History of right breast cancer post mastectomy. EXAM: CT NECK WITH CONTRAST TECHNIQUE: Multidetector CT imaging of the neck was performed using the standard protocol following the bolus administration of intravenous contrast. CONTRAST:  75 cc Isovue-300 COMPARISON:  09/20/2016 CT chest FINDINGS: Pharynx and larynx: Retropharyngeal/prevertebral abscess extending from the C3 level to upper mediastinum continuous with large cavitary abscess in left upper lobe of the lung. 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 Extensive edema is present throughout the upper mediastinum and lower neck soft tissues. Widely  patent airway. Salivary glands: No inflammation, mass, or stone. Thyroid: Normal. Lymph nodes: Extensive mediastinal adenopathy. Vascular: Negative. Limited intracranial: Negative. Visualized orbits: Negative. Mastoids and visualized paranasal sinuses: Clear. Skeleton: C3-C6 interbody fusion. Multilevel disc and facet degenerative changes greatest at the C3-4 level where there is severe canal stenosis and cord impingement. Upper chest: Left upper lobe cavitary abscess contiguous with upper mediastinum and retropharyngeal/ prevertebral space. Septal thickening and clustered nodules throughout left upper lobe likely representing acute bronchopneumonia. 6 mm nodule in right upper lobe (series 5, image 40). Other: None. IMPRESSION: 1. Large cavitary abscess in left upper lobe of the lung contiguous with the superior mediastinum and prevertebral/ retropharyngeal space. 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 2. Extensive inflammation within upper and anterior mediastinum as well as the lower neck soft tissues. 3. Mediastinal adenopathy. Electronically Signed   By: Mitzi Hansen M.D.   On: 09/21/2017 02:12   Ct Chest W Contrast  Result Date: 09/20/2017 CLINICAL DATA:  Nausea and vomiting. Pt is presented by her spouse, both collaborate a report that since her enteral feeding tube was taken out about a week ago, she has not "been doing well, has not had any meaningful PO intake and is progressively worsening. EXAM: CT CHEST, ABDOMEN, AND PELVIS WITH CONTRAST TECHNIQUE: Multidetector CT imaging of the chest, abdomen and pelvis was performed following the standard protocol during bolus administration of intravenous contrast. CONTRAST:  ISOVUE-300 IOPAMIDOL (ISOVUE-300) INJECTION 61%  COMPARISON:  Chest x-ray 09/20/2017, esophagram 11/11/2016 FINDINGS: CT CHEST FINDINGS Cardiovascular: There is pectus excavatum, accentuating the axis of the heart. Extensive coronary artery calcifications. Mitral calcifications. The thoracic aorta is partially calcified but not aneurysmal. The pulmonary arteries are well opacified accounting for the contrast bolus timing. Mediastinum/Nodes: There is a complex air-debris collection within the superior mediastinum, posterior to the upper esophagus. Collection measures 5.8 x 2.6 x 7.3 cm and is centered at T1-2. This is immediately adjacent to the left apical cavitary lesion, described below. Lungs/Pleura: Within the left lung apex, there is a thick-walled cavitary lesion which measures 6.3 x 6.7 cm. Air-fluid level is identified within this lesion. This cavitary lesion is contiguous with the mediastinal air -debris collection. There are reticular changes involving the left lung, right middle lobe, and medial right lower lobe. Large airways are patent. There is atelectasis at both lung bases, left greater than right. A small pulmonary nodule is identified in the right upper lobe, measuring 5 mm on image 36 of series 9. Musculoskeletal: Increased sclerosis of incompletely imaged C7, T1, and T2, suspicious for metastatic disease. Degenerative changes are seen in the thoracic and lumbar spine. Bilateral hip arthroplasty. Additional: Right mastectomy. CT ABDOMEN PELVIS FINDINGS Hepatobiliary: There is mild intrahepatic biliary duct dilatation. Gallbladder is distended. Pancreas: Unremarkable. No pancreatic ductal dilatation or surrounding inflammatory changes. Spleen: Normal in size without focal abnormality. Adrenals/Urinary Tract: Normal adrenal glands. Small bilateral renal cysts. Urinary bladder is partially obscured by significant artifact in the pelvis. Stomach/Bowel: Visible tract from previous gastrostomy tube. Otherwise the stomach is normal in appearance. No  bowel dilatation. Pelvic loops are partially obscured by metallic artifact. Vascular/Lymphatic: There is atherosclerotic calcification of the abdominal aorta, measuring up to 2.8 cm. No significant adenopathy in the pelvis. Reproductive: Uterus is obscured or absent. Other: None Musculoskeletal: Scoliosis and significant degenerative changes in the thoracolumbar spine. IMPRESSION: 1. Complex air and debris collection posterior to the esophagus in the  superior mediastinum and contiguous with a large bowel cavitary lesion in the left upper lobe. The findings are consistent with esophageal/phalangeal perforation and abscess formation. 2. Sclerotic vertebral bodies of C7, T1, and T2, suspicious for metastatic disease given the history of right breast cancer. 3. Further evaluation with CT of the neck with intravenous contrast is recommended to evaluate the upper extent of the retroperitoneal abscess. 4. Coronary artery disease. 5.  Aortic atherosclerosis.  (ICD10-I70.0) 6. Abdominal aortic atherosclerosis. Ectatic abdominal aorta at risk for aneurysm development. Recommend followup by ultrasound in 5 years. This recommendation follows ACR consensus guidelines: White Paper of the ACR Incidental Findings Committee II on Vascular Findings. J Am Coll Radiol 2013; 10:789-794. 7. The findings are discussed with Dr. Silverio Lay. Electronically Signed   By: Norva Pavlov M.D.   On: 09/20/2017 22:49   Ct Abdomen Pelvis W Contrast  Result Date: 09/20/2017 CLINICAL DATA:  Nausea and vomiting. Pt is presented by her spouse, both collaborate a report that since her enteral feeding tube was taken out about a week ago, she has not "been doing well, has not had any meaningful PO intake and is progressively worsening. EXAM: CT CHEST, ABDOMEN, AND PELVIS WITH CONTRAST TECHNIQUE: Multidetector CT imaging of the chest, abdomen and pelvis was performed following the standard protocol during bolus administration of intravenous contrast.  CONTRAST:  ISOVUE-300 IOPAMIDOL (ISOVUE-300) INJECTION 61% COMPARISON:  Chest x-ray 09/20/2017, esophagram 11/11/2016 FINDINGS: CT CHEST FINDINGS Cardiovascular: There is pectus excavatum, accentuating the axis of the heart. Extensive coronary artery calcifications. Mitral calcifications. The thoracic aorta is partially calcified but not aneurysmal. The pulmonary arteries are well opacified accounting for the contrast bolus timing. Mediastinum/Nodes: There is a complex air-debris collection within the superior mediastinum, posterior to the upper esophagus. Collection measures 5.8 x 2.6 x 7.3 cm and is centered at T1-2. This is immediately adjacent to the left apical cavitary lesion, described below. Lungs/Pleura: Within the left lung apex, there is a thick-walled cavitary lesion which measures 6.3 x 6.7 cm. Air-fluid level is identified within this lesion. This cavitary lesion is contiguous with the mediastinal air -debris collection. There are reticular changes involving the left lung, right middle lobe, and medial right lower lobe. Large airways are patent. There is atelectasis at both lung bases, left greater than right. A small pulmonary nodule is identified in the right upper lobe, measuring 5 mm on image 36 of series 9. Musculoskeletal: Increased sclerosis of incompletely imaged C7, T1, and T2, suspicious for metastatic disease. Degenerative changes are seen in the thoracic and lumbar spine. Bilateral hip arthroplasty. Additional: Right mastectomy. CT ABDOMEN PELVIS FINDINGS Hepatobiliary: There is mild intrahepatic biliary duct dilatation. Gallbladder is distended. Pancreas: Unremarkable. No pancreatic ductal dilatation or surrounding inflammatory changes. Spleen: Normal in size without focal abnormality. Adrenals/Urinary Tract: Normal adrenal glands. Small bilateral renal cysts. Urinary bladder is partially obscured by significant artifact in the pelvis. Stomach/Bowel: Visible tract from previous  gastrostomy tube. Otherwise the stomach is normal in appearance. No bowel dilatation. Pelvic loops are partially obscured by metallic artifact. Vascular/Lymphatic: There is atherosclerotic calcification of the abdominal aorta, measuring up to 2.8 cm. No significant adenopathy in the pelvis. Reproductive: Uterus is obscured or absent. Other: None Musculoskeletal: Scoliosis and significant degenerative changes in the thoracolumbar spine. IMPRESSION: 1. Complex air and debris collection posterior to the esophagus in the superior mediastinum and contiguous with a large bowel cavitary lesion in the left upper lobe. The findings are consistent with esophageal/phalangeal perforation and abscess formation. 2.  Sclerotic vertebral bodies of C7, T1, and T2, suspicious for metastatic disease given the history of right breast cancer. 3. Further evaluation with CT of the neck with intravenous contrast is recommended to evaluate the upper extent of the retroperitoneal abscess. 4. Coronary artery disease. 5.  Aortic atherosclerosis.  (ICD10-I70.0) 6. Abdominal aortic atherosclerosis. Ectatic abdominal aorta at risk for aneurysm development. Recommend followup by ultrasound in 5 years. This recommendation follows ACR consensus guidelines: White Paper of the ACR Incidental Findings Committee II on Vascular Findings. J Am Coll Radiol 2013; 10:789-794. 7. The findings are discussed with Dr. Silverio Lay. Electronically Signed   By: Norva Pavlov M.D.   On: 09/20/2017 22:49      Assessment/Plan COPD HTN, CAD s/p PCI and stenting RCA, PAF H/o breast cancer Chronic pain Esophageal perforation and LUL abscess Malnutrition Dysphagia - Request for open G-tube placement.  - previous G-tube placed in 2016, and in 11/2016. Removed 09/16/17 - Will discuss timing of G-tube placement with M.D.   Franne Forts, [facility] 09/22/2017, 10:12 AM Pager: [phone] Consults: [phone] Mon-Fri 7:00 am-4:30 pm Sat-Sun  7:00 am-11:30 am

## 2017-09-22 NOTE — Progress Notes (Signed)
Patient has transfer order to [facility] form,medical necessity needs to be filled out.MD on call text paged. Lenox Bink, Wonda Cheng, Therapist, sports

## 2017-09-22 NOTE — Progress Notes (Addendum)
[facility] Progress Note  Patient: [name] NWG:[id number]   PCP: Ileana Ladd, MD DOB: [birth year]   DOA: 09/20/2017   DOS: 09/22/2017   Date of Service: the patient was seen and examined on 09/22/2017  Subjective: Continues to have her chronic diarrhea.  No nausea no vomiting.  Has the neck pain.  No fever no chills here.  Brief hospital course: Pt. 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 Currently further plan is continue IV antibiotics and follow-up on recommendation from CT surgery.  Assessment and Plan: 1)Mediastinal abscess; suspected esophageal perforation-no further fevers or chills, white count is down from 17.9 on admission, Imaging studies suggest abscess in superior mediastinum, contiguous with left apical lung cavitation, felt to reflect recurrent esophageal perforation with abscess, previously had surgery for Zenker's diverticulum with ENT, complicated by recurrent leak and abscess formation, followed by ID and has been on Augmentin.   Previously had care at [facility] in [place], at this time patient declines transfer to [facility] -CTS was consulted, Dr. Tyrone Sage on care team. interventional radiology states that lesion is not amenable to image guided approach.   Continue iv vancomycin (started 09/20/17)  and Zosyn (started 09/21/17) pending blood cultures   Addendum: Received a call from Dr. Lazarus Salines ENT.  Dr. Lazarus Salines has discussed case with Dr. Elfredia Nevins at [facility].  Intermediary resident was Dr. Arita Miss.  Based on their discussion Dr. Lazarus Salines recommends that the patient should be transferred to [facility] for further care of her current abscess. Patient was initially reluctant and wanted to discuss with ENT but later on  agreed to be transferred to [facility]. I have called out to physician access line, awaiting callback.  Lynden Oxford 5:22 PM 09/22/2017    2)Hyponatremia-sodium is up to 132 from 127 on admission, continue to hydrate  3)Chronic pain- -Stable, -Continue home-regimen with long-acting morphine, Oxy IR, gabapentin, Voltaren  4)Prior H/o CAD/PAF- - Sinus rhythm this admission , - CHADS-VASc is 74 (age, gender, HTN, CAD), -A fib was only seen during an episode of sepsis, -Follows with cardiology, no recurrences noted, not anticoagulated. No Acs type sxs  5)Hx of breast cancer-  Sclerosis of C7, T1, and T2 noted incidentally on CT, raising concern for metastatic disease  6)COPD- -Stable, no acute exacerbation at this time continue bronchodilators,  7) chronic diarrhea. Continue Questran per home regimen.  8) G-tube. Patient due to having esophageal leak had a G-tube placed.  Removed by ENT at [facility] on 09/16/2017 since the patient was able to tolerate oral diet and did not have any complain. IR consulted for replacement of the G-tube.  Diet: N.p.o. by mouth, until seen by cardio thoracic surgery. DVT Prophylaxis: subcutaneous Heparin  Advance goals of care discussion: full code  Family Communication: no family was present at bedside, at the time of interview.  Disposition:  Discharge to home.  Consultants: Cardiac thoracic surgery, interventional radiology Procedures: g tube placement  Antibiotics: Anti-infectives (From admission, onward)   Start     Dose/Rate Route Frequency Ordered Stop   09/22/17 0900  fluconazole (DIFLUCAN) IVPB 100 mg     100 mg 50 mL/hr over 60 Minutes Intravenous Every 24 hours 09/22/17 0751     09/21/17 2200  vancomycin (VANCOCIN) IVPB 750 mg/150 ml premix  750 mg 150 mL/hr over 60 Minutes Intravenous Every 24 hours 09/21/17 0035     09/21/17 1300  piperacillin-tazobactam (ZOSYN) IVPB 3.375 g     3.375  g 12.5 mL/hr over 240 Minutes Intravenous Every 8 hours 09/21/17 1239     09/20/17 2300  piperacillin-tazobactam (ZOSYN) IVPB 3.375 g    Comments:  Has been taking Augmentin   3.375 g 100 mL/hr over 30 Minutes Intravenous  Once 09/20/17 2251 09/21/17 0023   09/20/17 2100  vancomycin (VANCOCIN) IVPB 1000 mg/200 mL premix     1,000 mg 200 mL/hr over 60 Minutes Intravenous  Once 09/20/17 2045 09/20/17 2314   09/20/17 2100  ceFEPIme (MAXIPIME) 1 g in dextrose 5 % 50 mL IVPB     1 g 100 mL/hr over 30 Minutes Intravenous  Once 09/20/17 2045 09/20/17 2211       Objective: Physical Exam: Vitals:   09/21/17 2041 09/22/17 0436 09/22/17 0828 09/22/17 1020  BP: (!) 113/53 138/68 (!) 144/68   Pulse: 65 73 68   Resp: 17 17 18    Temp: 98.2 F (36.8 C) 98.4 F (36.9 C) 98.4 F (36.9 C)   TempSrc: Oral Oral Oral   SpO2: 93% 95% 94% 97%  Weight: 60.9 kg (134 lb 3.2 oz)     Height: 5\' 2"  (1.575 m)       Intake/Output Summary (Last 24 hours) at 09/22/2017 1556 Last data filed at 09/22/2017 1030 Gross per 24 hour  Intake 550 ml  Output 150 ml  Net 400 ml   Filed Weights   09/20/17 2208 09/21/17 2041  Weight: 59 kg (130 lb) 60.9 kg (134 lb 3.2 oz)   General: Alert, Awake and Oriented to Time, Place and Person. Appear in no distress, affect appropriate Eyes: PERRL, Conjunctiva normal ENT: Oral Mucosa clear moist. Neck: no JVD, no Abnormal Mass Or lumps Cardiovascular: S1 and S2 Present, no Murmur, Peripheral Pulses Present Respiratory: increased respiratory effort, Bilateral Air entry equal and Decreased, no use of accessory muscle, basal Crackles, no wheezes Abdomen: Bowel Sound present, Soft and mild tenderness, n hernia Skin: no redness, no Rash, no induration Extremities: no Pedal edema, no calf tenderness Neurologic: Grossly no focal neuro deficit. Bilaterally Equal motor strength  Data Reviewed: CBC: Recent Labs  Lab 09/20/17 1826 09/21/17 0521 09/22/17 0811  WBC 17.9* 9.9  10.0  NEUTROABS 15.3* 7.8* 8.2*  HGB 11.1* 9.4* 10.0*  HCT 32.2* 28.3* 30.5*  MCV 85.2 86.3 85.0  PLT 398 317 370   Basic Metabolic Panel: Recent Labs  Lab 09/20/17 1826 09/21/17 0208 09/21/17 0926 09/21/17 1344 09/22/17 0811  NA 127* 129* 133* 132* 131*  K 3.6 3.0* 3.1* 2.9* 3.7  CL 88* 91* 95* 93* 92*  CO2 27 29 29 30 24   GLUCOSE 140* 141* 141* 139* 104*  BUN 15 13 11 11 6   CREATININE 0.62 0.47 0.38* 0.46 0.46  CALCIUM 8.5* 8.1* 8.0* 8.0* 8.0*  MG  --   --   --   --  1.9    Liver Function Tests: Recent Labs  Lab 09/20/17 1826 09/22/17 0811  AST 27 16  ALT 16 13*  ALKPHOS 125 100  BILITOT 0.8 1.0  PROT 7.7 6.5  ALBUMIN 2.6* 2.0*   No results for input(s): LIPASE, AMYLASE in the last 168 hours. No results for input(s): AMMONIA in the last 168 hours. Coagulation Profile: Recent Labs  Lab 09/20/17 1826 09/22/17 0811  INR 1.23 1.20   Cardiac Enzymes: No results  for input(s): CKTOTAL, CKMB, CKMBINDEX, TROPONINI in the last 168 hours. BNP (last 3 results) No results for input(s): PROBNP in the last 8760 hours. CBG: Recent Labs  Lab 09/21/17 0824 09/22/17 0733  GLUCAP 119* 110*   Studies: No results found.  Scheduled Meds: . gabapentin  600 mg Oral QID  . heparin injection (subcutaneous)  5,000 Units Subcutaneous Q8H  . lidocaine      . losartan  25 mg Oral Daily  . mometasone-formoterol  2 puff Inhalation BID  . morphine  30 mg Oral Q6H   Continuous Infusions: . famotidine (PEPCID) IV Stopped (09/22/17 1030)  . fluconazole (DIFLUCAN) IV Stopped (09/22/17 1100)  . piperacillin-tazobactam (ZOSYN)  IV 3.375 g (09/22/17 1515)  . vancomycin Stopped (09/22/17 0045)   PRN Meds: acetaminophen **OR** acetaminophen, albuterol, lidocaine, loperamide, morphine injection, ondansetron **OR** ondansetron (ZOFRAN) IV, oxyCODONE, senna-docusate, sodium chloride flush  Time spent: 35 minutes  Author: Lynden Oxford, MD [facility] Pager:  [phone] 09/22/2017 3:56 PM  If 7PM-7AM, please contact night-coverage at [email or web address], password [facility]

## 2017-09-22 NOTE — Progress Notes (Signed)
Report called to Mitzi Hansen, RN at [facility]. Pt will be going to room C924.

## 2017-09-22 NOTE — Progress Notes (Signed)
Peripherally Inserted Central Catheter/Midline Placement  The IV Nurse has discussed with the patient and/or persons authorized to consent for the patient, the purpose of this procedure and the potential benefits and risks involved with this procedure.  The benefits include less needle sticks, lab draws from the catheter, and the patient may be discharged home with the catheter. Risks include, but not limited to, infection, bleeding, blood clot (thrombus formation), and puncture of an artery; nerve damage and irregular heartbeat and possibility to perform a PICC exchange if needed/ordered by physician.  Alternatives to this procedure were also discussed.  B[employer] Power PICC patient education guide, fact sheet on infection prevention and patient information card has been provided to patient /or left at bedside.    PICC/Midline Placement Documentation        [name] 09/22/2017, 1:13 PM

## 2017-09-22 NOTE — Discharge Summary (Signed)
[facility] Discharge Summary   Patient: [name] ZOX:[id number]   PCP: Ileana Ladd, MD DOB: [birth year]   Date of admission: 09/20/2017   Date of discharge:  09/22/2017    Discharge Diagnoses:  Principal Problem:   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HCC) Active Problems:   COPD (chronic obstructive pulmonary disease) (HCC)   Essential hypertension   Coronary artery disease   Hyponatremia   Sepsis, unspecified organism (HCC)   PAF (paroxysmal atrial fibrillation) (HCC)   Esophageal perforation   Esophageal dysphagia   Esophageal fistula   Chronic pain   Pressure injury of skin   Admitted From: home Disposition: [facility]  Recommendations for Outpatient Follow-up:  Patient was accepted by Dr. Manson Passey ENT under ENT care.  Diet recommendation: N.p.o. except ice chips  Activity: The patient is advised to gradually reintroduce usual activities.  Discharge Condition: stable  Code Status: full code  History of present illness: As per the H and P dictated on admission, "[name] is a 72 y.o. 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  Over the past 4 days, she has developed a general malaise with subjective fevers and chills, as well as anorexia.  No chest pain, headaches, or focal numbness or weakness.  Reports continued adherence with her medications.  ED Course: Upon arrival to the ED, patient is found to have a temp of 37.9 C, saturating low 90s on room air, mildly tachycardic, and with vitals otherwise normal.  Chest x-ray features a masslike cavitated region  in the medial left upper lobe which is followed by CT which demonstrates abscess in the superior mediastinum contiguous with left apical lung abscess and likely reflecting recurrent esophageal perforation with abscess.  Chemistry panel is notable for sodium 127.  CBC features a leukocytosis to 17,900.  Lactic acid is reassuringly normal.  Blood cultures were collected, 1 L of normal saline was administered, and the patient was started on empiric cefepime and vancomycin.  CT surgery was consulted by the ED physician and recommended a medical admission to [facility].  Patient remains hemodynamically stable, is not in acute distress, and will be admitted to the symmetry unit at [facility] for ongoing evaluation and management suspected recurrent esophageal perforation with abscess.  "  Hospital Course:  Summary of her active problems in the hospital is as following. 1)Mediastinal abscess; suspected esophageal perforation-no further fevers or chills,white count is down from 17.9 on admission,Imaging studies suggestabscess in superior mediastinum, contiguous with left apical lung cavitation, felt to reflect recurrent esophageal perforation with abscess, previously had surgery for Zenker's diverticulum with ENT, complicated by recurrent leak and abscess formation, followed by ID and has been on Augmentin. Previously had care at [facility] in [place], at this time patient declines [facility] -[facility] was consulted, interventional radiology states that lesion is not amenable to image guided approach. Continue ivvancomycin (started 09/20/17)and Zosyn (started 09/21/17) pending blood cultures   Received a call from Dr. Lazarus Salines ENT.  Dr. Lazarus Salines has discussed case with Dr. Elfredia Nevins at [facility].  Intermediary resident was Dr. Arita Miss.  Based on their discussion Dr. Lazarus Salines  recommends that the patient should be transferred to [facility] for  further care of her current abscess. Patient was initially reluctant and wanted to discuss with ENT but later on agreed to be transferred to [facility]. I have called out to physician access line, and patient accepted under ENT.  Awaiting bed placement.  2)Hyponatremia-sodium is up to 132 from 127 on admission, continue to hydrate  3)Chronic pain--Stable,-Continue home-regimen with long-acting morphine, Oxy IR, gabapentin, Voltaren  4)Prior H/oCAD/PAF-- Sinus rhythmthisadmission ,- CHADS-VASc is 30 (age, gender, HTN, CAD),-A fib was only seen during an episode of sepsis,-Follows with cardiology, no recurrences noted, not anticoagulated. No Acs type sxs  5)Hx of breast cancer-Sclerosis of C7, T1, and T2 noted incidentally on CT, raising concern for metastatic disease  6)COPD--Stable,no acute exacerbation at this time continue bronchodilators,  7) chronic diarrhea. Continue Questran per home regimen.  8) G-tube. Patient due to having esophageal leak had a G-tube placed.  Removed by ENT at [facility] on 09/16/2017 since the patient was able to tolerate oral diet and did not have any complain. IR consulted for replacement of the G-tube.  Patient tolerated the procedure well.  All other chronic medical condition were stable during the hospitalization.  Patient was accepted as a transfer under care of Dr. Manson Passey ENT for recurrent esophageal abscess.  Procedures and Results:  IR guided percutaneous gastrostomy catheter placement  Consultations:  ENT  Interventional radiology  Cardio thoracic surgery.  DISCHARGE MEDICATION: Allergies as of 09/22/2017      Reactions   Cephalexin Other (See Comments)   Took off first layer of skin inside of mouth   Zithromax [azithromycin Dihydrate] Other (See Comments)   ORAL ULCERS      Medication List    STOP taking these medications   furosemide 40 MG tablet Commonly known as:  LASIX       TAKE these medications   albuterol 108 (90 Base) MCG/ACT inhaler Commonly known as:  PROVENTIL HFA;VENTOLIN HFA Inhale 2 puffs into the lungs every 6 (six) hours as needed for wheezing.   ALPRAZolam 0.25 MG tablet Commonly known as:  XANAX Take one tablet daily or as needed   amoxicillin-clavulanate 875-125 MG tablet Commonly known as:  AUGMENTIN Take 1 tablet by mouth 2 (two) times daily.   budesonide-formoterol 160-4.5 MCG/ACT inhaler Commonly known as:  SYMBICORT Inhale 2 puffs into the lungs 2 (two) times daily as needed (Wheezing). Reported on 10/16/2015   cholestyramine light 4 g packet Commonly known as:  PREVALITE Take 1 packet (4 g total) by mouth every 12 (twelve) hours.   diclofenac 75 MG EC tablet Commonly known as:  VOLTAREN Take 75 mg by mouth daily as needed for moderate pain.   fluconazole 2 mg/mL Soln IVPB Commonly known as:  DIFLUCAN Inject 50 mLs (100 mg total) into the vein daily. Start taking on:  09/23/2017   gabapentin 600 MG tablet Commonly known as:  NEURONTIN Take 600 mg by mouth 4 (four) times daily.   losartan 25 MG tablet Commonly known as:  COZAAR Take 25 mg by mouth daily.   morphine 30 MG 12 hr tablet Commonly known as:  MS CONTIN Take 30 mg by mouth 4 (four) times daily.   oxyCODONE 15 MG immediate release tablet Commonly known as:  ROXICODONE Take 15 mg by mouth 4 (four) times daily.   piperacillin-tazobactam 3.375 GM/50ML IVPB Commonly known as:  ZOSYN Inject 50 mLs (3.375 g total) into the vein every 8 (  eight) hours.   Vancomycin 750-5 MG/150ML-% Soln Commonly known as:  VANCOCIN Inject 150 mLs (750 mg total) into the vein daily.      Allergies  Allergen Reactions  . Cephalexin Other (See Comments)    Took off first layer of skin inside of mouth  . Zithromax [Azithromycin Dihydrate] Other (See Comments)    ORAL ULCERS    Discharge Instructions    Ice chips   Complete by:  As directed    Increase activity slowly    Complete by:  As directed      Discharge Exam: Filed Weights   09/20/17 2208 09/21/17 2041  Weight: 59 kg (130 lb) 60.9 kg (134 lb 3.2 oz)   Vitals:   09/22/17 1020 09/22/17 1604  BP:  133/62  Pulse:  67  Resp:  17  Temp:  97.9 F (36.6 C)  SpO2: 97% 100%   General: Appear in mild distress, no Rash; Oral Mucosa moist. Cardiovascular: S1 and S2 Present, no Murmur, no JVD Respiratory: Bilateral Air entry present and basal Crackles, no wheezes Abdomen: Bowel Sound present, Soft and mild tenderness Extremities: no Pedal edema, no calf tenderness Neurology: Grossly no focal neuro deficit.  The results of significant diagnostics from this hospitalization (including imaging, microbiology, ancillary and laboratory) are listed below for reference.    Significant Diagnostic Studies: Dg Chest 2 View  Result Date: 09/20/2017 CLINICAL DATA:  Weakness.  History of breast cancer. EXAM: CHEST  2 VIEW COMPARISON:  November 08, 2016 FINDINGS: There is a rounded cavitated masslike region in the medial left upper lung. Mild patchy opacity in the left lower lung suspected. No pneumothorax. The right lung is clear. No other definitive changes. IMPRESSION: There is a masslike cavitated region in the medial left upper lung which could represent a cavitated neoplasm or cavitated infectious process. There is also suspicion of patchy opacity throughout the left lower lung. Recommend CT imaging for better evaluation. Electronically Signed   By: Gerome Sam III M.D   On: 09/20/2017 18:55   Ct Soft Tissue Neck W Contrast  Result Date: 09/21/2017 CLINICAL DATA:  72 y/o F; cough, fever, failure to thrive. History of right breast cancer post mastectomy. EXAM: CT NECK WITH CONTRAST TECHNIQUE: Multidetector CT imaging of the neck was performed using the standard protocol following the bolus administration of intravenous contrast. CONTRAST:  75 cc Isovue-300 COMPARISON:  09/20/2016 CT chest FINDINGS: Pharynx and  larynx: Retropharyngeal/prevertebral abscess extending from the C3 level to upper mediastinum continuous with large cavitary abscess in left upper lobe of the lung. 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 Extensive edema is present throughout the upper mediastinum and lower neck soft tissues. Widely patent airway. Salivary glands: No inflammation, mass, or stone. Thyroid: Normal. Lymph nodes: Extensive mediastinal adenopathy. Vascular: Negative. Limited intracranial: Negative. Visualized orbits: Negative. Mastoids and visualized paranasal sinuses: Clear. Skeleton: C3-C6 interbody fusion. Multilevel disc and facet degenerative changes greatest at the C3-4 level where there is severe canal stenosis and cord impingement. Upper chest: Left upper lobe cavitary abscess contiguous with upper mediastinum and retropharyngeal/ prevertebral space. Septal thickening and clustered nodules throughout left upper lobe likely representing acute bronchopneumonia. 6 mm nodule in right upper lobe (series 5, image 40). Other: None. IMPRESSION: 1. Large cavitary abscess in left upper lobe of the lung contiguous with the superior mediastinum and prevertebral/ retropharyngeal space. The prevertebral/ retropharyngeal component extends to  the C3 level where there is a fistula to the posterior aspect of hypopharynx. 2. Extensive inflammation within upper and anterior mediastinum as well as the lower neck soft tissues. 3. Mediastinal adenopathy. Electronically Signed   By: Mitzi Hansen M.D.   On: 09/21/2017 02:12   Ct Chest W Contrast  Result Date: 09/20/2017 CLINICAL DATA:  Nausea and vomiting. Pt is presented by her spouse, both collaborate a report that since her enteral feeding tube was taken out about a week ago, she has not "been doing well, has not had any meaningful PO intake and is progressively worsening. EXAM: CT CHEST, ABDOMEN, AND  PELVIS WITH CONTRAST TECHNIQUE: Multidetector CT imaging of the chest, abdomen and pelvis was performed following the standard protocol during bolus administration of intravenous contrast. CONTRAST:  ISOVUE-300 IOPAMIDOL (ISOVUE-300) INJECTION 61% COMPARISON:  Chest x-ray 09/20/2017, esophagram 11/11/2016 FINDINGS: CT CHEST FINDINGS Cardiovascular: There is pectus excavatum, accentuating the axis of the heart. Extensive coronary artery calcifications. Mitral calcifications. The thoracic aorta is partially calcified but not aneurysmal. The pulmonary arteries are well opacified accounting for the contrast bolus timing. Mediastinum/Nodes: There is a complex air-debris collection within the superior mediastinum, posterior to the upper esophagus. Collection measures 5.8 x 2.6 x 7.3 cm and is centered at T1-2. This is immediately adjacent to the left apical cavitary lesion, described below. Lungs/Pleura: Within the left lung apex, there is a thick-walled cavitary lesion which measures 6.3 x 6.7 cm. Air-fluid level is identified within this lesion. This cavitary lesion is contiguous with the mediastinal air -debris collection. There are reticular changes involving the left lung, right middle lobe, and medial right lower lobe. Large airways are patent. There is atelectasis at both lung bases, left greater than right. A small pulmonary nodule is identified in the right upper lobe, measuring 5 mm on image 36 of series 9. Musculoskeletal: Increased sclerosis of incompletely imaged C7, T1, and T2, suspicious for metastatic disease. Degenerative changes are seen in the thoracic and lumbar spine. Bilateral hip arthroplasty. Additional: Right mastectomy. CT ABDOMEN PELVIS FINDINGS Hepatobiliary: There is mild intrahepatic biliary duct dilatation. Gallbladder is distended. Pancreas: Unremarkable. No pancreatic ductal dilatation or surrounding inflammatory changes. Spleen: Normal in size without focal abnormality.  Adrenals/Urinary Tract: Normal adrenal glands. Small bilateral renal cysts. Urinary bladder is partially obscured by significant artifact in the pelvis. Stomach/Bowel: Visible tract from previous gastrostomy tube. Otherwise the stomach is normal in appearance. No bowel dilatation. Pelvic loops are partially obscured by metallic artifact. Vascular/Lymphatic: There is atherosclerotic calcification of the abdominal aorta, measuring up to 2.8 cm. No significant adenopathy in the pelvis. Reproductive: Uterus is obscured or absent. Other: None Musculoskeletal: Scoliosis and significant degenerative changes in the thoracolumbar spine. IMPRESSION: 1. Complex air and debris collection posterior to the esophagus in the superior mediastinum and contiguous with a large bowel cavitary lesion in the left upper lobe. The findings are consistent with esophageal/phalangeal perforation and abscess formation. 2. Sclerotic vertebral bodies of C7, T1, and T2, suspicious for metastatic disease given the history of right breast cancer. 3. Further evaluation with CT of the neck with intravenous contrast is recommended to evaluate the upper extent of the retroperitoneal abscess. 4. Coronary artery disease. 5.  Aortic atherosclerosis.  (ICD10-I70.0) 6. Abdominal aortic atherosclerosis. Ectatic abdominal aorta at risk for aneurysm development. Recommend followup by ultrasound in 5 years. This recommendation follows ACR consensus guidelines: White Paper of the ACR Incidental Findings Committee II on Vascular Findings. J Am Coll Radiol 2013; 10:789-794. 7. The  findings are discussed with Dr. Silverio Lay. Electronically Signed   By: Norva Pavlov M.D.   On: 09/20/2017 22:49   Ct Abdomen Pelvis W Contrast  Result Date: 09/20/2017 CLINICAL DATA:  Nausea and vomiting. Pt is presented by her spouse, both collaborate a report that since her enteral feeding tube was taken out about a week ago, she has not "been doing well, has not had any meaningful PO  intake and is progressively worsening. EXAM: CT CHEST, ABDOMEN, AND PELVIS WITH CONTRAST TECHNIQUE: Multidetector CT imaging of the chest, abdomen and pelvis was performed following the standard protocol during bolus administration of intravenous contrast. CONTRAST:  ISOVUE-300 IOPAMIDOL (ISOVUE-300) INJECTION 61% COMPARISON:  Chest x-ray 09/20/2017, esophagram 11/11/2016 FINDINGS: CT CHEST FINDINGS Cardiovascular: There is pectus excavatum, accentuating the axis of the heart. Extensive coronary artery calcifications. Mitral calcifications. The thoracic aorta is partially calcified but not aneurysmal. The pulmonary arteries are well opacified accounting for the contrast bolus timing. Mediastinum/Nodes: There is a complex air-debris collection within the superior mediastinum, posterior to the upper esophagus. Collection measures 5.8 x 2.6 x 7.3 cm and is centered at T1-2. This is immediately adjacent to the left apical cavitary lesion, described below. Lungs/Pleura: Within the left lung apex, there is a thick-walled cavitary lesion which measures 6.3 x 6.7 cm. Air-fluid level is identified within this lesion. This cavitary lesion is contiguous with the mediastinal air -debris collection. There are reticular changes involving the left lung, right middle lobe, and medial right lower lobe. Large airways are patent. There is atelectasis at both lung bases, left greater than right. A small pulmonary nodule is identified in the right upper lobe, measuring 5 mm on image 36 of series 9. Musculoskeletal: Increased sclerosis of incompletely imaged C7, T1, and T2, suspicious for metastatic disease. Degenerative changes are seen in the thoracic and lumbar spine. Bilateral hip arthroplasty. Additional: Right mastectomy. CT ABDOMEN PELVIS FINDINGS Hepatobiliary: There is mild intrahepatic biliary duct dilatation. Gallbladder is distended. Pancreas: Unremarkable. No pancreatic ductal dilatation or surrounding inflammatory  changes. Spleen: Normal in size without focal abnormality. Adrenals/Urinary Tract: Normal adrenal glands. Small bilateral renal cysts. Urinary bladder is partially obscured by significant artifact in the pelvis. Stomach/Bowel: Visible tract from previous gastrostomy tube. Otherwise the stomach is normal in appearance. No bowel dilatation. Pelvic loops are partially obscured by metallic artifact. Vascular/Lymphatic: There is atherosclerotic calcification of the abdominal aorta, measuring up to 2.8 cm. No significant adenopathy in the pelvis. Reproductive: Uterus is obscured or absent. Other: None Musculoskeletal: Scoliosis and significant degenerative changes in the thoracolumbar spine. IMPRESSION: 1. Complex air and debris collection posterior to the esophagus in the superior mediastinum and contiguous with a large bowel cavitary lesion in the left upper lobe. The findings are consistent with esophageal/phalangeal perforation and abscess formation. 2. Sclerotic vertebral bodies of C7, T1, and T2, suspicious for metastatic disease given the history of right breast cancer. 3. Further evaluation with CT of the neck with intravenous contrast is recommended to evaluate the upper extent of the retroperitoneal abscess. 4. Coronary artery disease. 5.  Aortic atherosclerosis.  (ICD10-I70.0) 6. Abdominal aortic atherosclerosis. Ectatic abdominal aorta at risk for aneurysm development. Recommend followup by ultrasound in 5 years. This recommendation follows ACR consensus guidelines: White Paper of the ACR Incidental Findings Committee II on Vascular Findings. J Am Coll Radiol 2013; 10:789-794. 7. The findings are discussed with Dr. Silverio Lay. Electronically Signed   By: Norva Pavlov M.D.   On: 09/20/2017 22:49   Ir Replc Gastro/colonic Tube  Percut W/fluoro  Result Date: 09/22/2017 CLINICAL DATA:  Inadvertent removal of long-term indwelling gastrostomy catheter approximately 6 days ago. Replacement is requested. EXAM:  GASTROSTOMY CATHETER REPLACEMENT FLUOROSCOPY TIME:  1.3 minutes; 54 uGym2 DAP TECHNIQUE: The procedure, risks, benefits, and alternatives were explained to the patient. Questions regarding the procedure were encouraged and answered. The patient understands and consents to the procedure. The site of prior gastrostomy catheter was prepped and draped in usual sterile fashion. Viscous lidocaine was applied liberally. Under fluoroscopic guidance, a short 5 [place] Kumpe the catheter was advanced through the tract into the lumen of the stomach, position confirmed with a small contrast injection. The stomach is decompressed. Contrast moved on into the decompressed duodenum. The catheter was exchanged over a short Amplatz wire for serial vascular dilators which facilitated placement of an 27 French balloon retention gastrostomy catheter. The retention balloon was inflated with 7 mL sterile saline. Contrast injection under fluoroscopy confirms were appropriate positioning within the stomach. The external bumper was applied. The catheter was capped. The patient tolerated the procedure well. COMPLICATIONS: COMPLICATIONS none IMPRESSION: 1. Technically successful replacement of 18 French balloon retention gastrostomy catheter under fluoroscopy. Electronically Signed   By: Corlis Leak M.D.   On: 09/22/2017 16:17    Microbiology: Recent Results (from the past 240 hour(s))  Culture, blood (Routine x 2)     Status: None (Preliminary result)   Collection Time: 09/20/17  6:27 PM  Result Value Ref Range Status   Specimen Description LEFT ANTECUBITAL  Final   Special Requests   Final    BOTTLES DRAWN AEROBIC AND ANAEROBIC Blood Culture adequate volume   Culture   Final    NO GROWTH 1 DAY Performed at [facility], [address] [place], [place] [address]    Report Status PENDING  Incomplete  Culture, blood (Routine x 2)     Status: None (Preliminary result)   Collection Time: 09/20/17  8:53 PM  Result Value Ref  Range Status   Specimen Description BLOOD LEFT FOREARM  Final   Special Requests   Final    BOTTLES DRAWN AEROBIC AND ANAEROBIC Blood Culture adequate volume   Culture   Final    NO GROWTH 1 DAY Performed at [facility], [address], [place], [place] [address]    Report Status PENDING  Incomplete     Labs: CBC: Recent Labs  Lab 09/20/17 1826 09/21/17 0521 09/22/17 0811  WBC 17.9* 9.9 10.0  NEUTROABS 15.3* 7.8* 8.2*  HGB 11.1* 9.4* 10.0*  HCT 32.2* 28.3* 30.5*  MCV 85.2 86.3 85.0  PLT 398 317 370   Basic Metabolic Panel: Recent Labs  Lab 09/20/17 1826 09/21/17 0208 09/21/17 0926 09/21/17 1344 09/22/17 0811  NA 127* 129* 133* 132* 131*  K 3.6 3.0* 3.1* 2.9* 3.7  CL 88* 91* 95* 93* 92*  CO2 27 29 29 30 24   GLUCOSE 140* 141* 141* 139* 104*  BUN 15 13 11 11 6   CREATININE 0.62 0.47 0.38* 0.46 0.46  CALCIUM 8.5* 8.1* 8.0* 8.0* 8.0*  MG  --   --   --   --  1.9   Liver Function Tests: Recent Labs  Lab 09/20/17 1826 09/22/17 0811  AST 27 16  ALT 16 13*  ALKPHOS 125 100  BILITOT 0.8 1.0  PROT 7.7 6.5  ALBUMIN 2.6* 2.0*   No results for input(s): LIPASE, AMYLASE in the last 168 hours. No results for input(s): AMMONIA in the last 168 hours.  Cardiac Enzymes: No results for input(s): CKTOTAL, CKMB, CKMBINDEX, TROPONINI in the last 168 hours. BNP (last 3 results) No results for input(s): BNP in the last 8760 hours. CBG: Recent Labs  Lab 09/21/17 0824 09/22/17 0733  GLUCAP 119* 110*   Time spent: 35 minutes  Signed:  Lynden Oxford  [facility]  09/22/2017  , 6:40 PM

## 2017-09-22 NOTE — Progress Notes (Signed)
Patient transferred to [facility] hospital,transported by [facility].All belongings sent with patient. [name], Wonda Cheng, Therapist, sports

## 2017-09-23 ENCOUNTER — Other Ambulatory Visit (HOSPITAL_COMMUNITY): Payer: Self-pay | Admitting: Physician Assistant

## 2017-09-23 DIAGNOSIS — K225 Diverticulum of esophagus, acquired: Secondary | ICD-10-CM | POA: Diagnosis not present

## 2017-09-23 DIAGNOSIS — K228 Other specified diseases of esophagus: Secondary | ICD-10-CM | POA: Diagnosis not present

## 2017-09-23 DIAGNOSIS — R918 Other nonspecific abnormal finding of lung field: Secondary | ICD-10-CM | POA: Diagnosis not present

## 2017-09-23 NOTE — Consult Note (Signed)
[facility] CM Primary Care Navigator  09/23/2017  [name] Mar 04, 1946 [id number]   Attempt to see patient but she was transferred to another institution/ hospital last night under ENT care.   Per MD note, patient was accepted as a transfer under care of Dr. Melinda Crutch- ENT for recurrent esophageal abscess at [facility].  Primary care provider's office is listed as providing transition of care (TOC) follow-up.   For additional questions please contact:  Karin Golden A. Brionna Romanek, BSN, RN-BC [facility] PRIMARY CARE Navigator Cell: [phone]

## 2017-09-24 DIAGNOSIS — J853 Abscess of mediastinum: Secondary | ICD-10-CM | POA: Diagnosis not present

## 2017-09-24 DIAGNOSIS — B9689 Other specified bacterial agents as the cause of diseases classified elsewhere: Secondary | ICD-10-CM | POA: Diagnosis not present

## 2017-09-24 DIAGNOSIS — Z1624 Resistance to multiple antibiotics: Secondary | ICD-10-CM | POA: Diagnosis not present

## 2017-09-24 DIAGNOSIS — B3789 Other sites of candidiasis: Secondary | ICD-10-CM | POA: Diagnosis not present

## 2017-09-24 DIAGNOSIS — J390 Retropharyngeal and parapharyngeal abscess: Secondary | ICD-10-CM | POA: Diagnosis not present

## 2017-09-24 DIAGNOSIS — J392 Other diseases of pharynx: Secondary | ICD-10-CM | POA: Diagnosis not present

## 2017-09-24 DIAGNOSIS — J9811 Atelectasis: Secondary | ICD-10-CM | POA: Diagnosis not present

## 2017-09-24 DIAGNOSIS — Z1611 Resistance to penicillins: Secondary | ICD-10-CM | POA: Diagnosis not present

## 2017-09-24 DIAGNOSIS — J852 Abscess of lung without pneumonia: Secondary | ICD-10-CM | POA: Diagnosis not present

## 2017-09-26 DIAGNOSIS — B3789 Other sites of candidiasis: Secondary | ICD-10-CM | POA: Diagnosis not present

## 2017-09-26 DIAGNOSIS — E119 Type 2 diabetes mellitus without complications: Secondary | ICD-10-CM | POA: Diagnosis not present

## 2017-09-26 DIAGNOSIS — J853 Abscess of mediastinum: Secondary | ICD-10-CM | POA: Diagnosis not present

## 2017-09-26 DIAGNOSIS — K225 Diverticulum of esophagus, acquired: Secondary | ICD-10-CM | POA: Diagnosis not present

## 2017-09-26 DIAGNOSIS — J449 Chronic obstructive pulmonary disease, unspecified: Secondary | ICD-10-CM | POA: Diagnosis not present

## 2017-09-26 DIAGNOSIS — R197 Diarrhea, unspecified: Secondary | ICD-10-CM | POA: Diagnosis not present

## 2017-09-26 DIAGNOSIS — J390 Retropharyngeal and parapharyngeal abscess: Secondary | ICD-10-CM | POA: Diagnosis not present

## 2017-09-26 DIAGNOSIS — J852 Abscess of lung without pneumonia: Secondary | ICD-10-CM | POA: Diagnosis not present

## 2017-09-26 DIAGNOSIS — K228 Other specified diseases of esophagus: Secondary | ICD-10-CM | POA: Diagnosis not present

## 2017-09-26 DIAGNOSIS — I480 Paroxysmal atrial fibrillation: Secondary | ICD-10-CM | POA: Diagnosis not present

## 2017-09-26 DIAGNOSIS — Z959 Presence of cardiac and vascular implant and graft, unspecified: Secondary | ICD-10-CM | POA: Diagnosis not present

## 2017-09-26 DIAGNOSIS — E871 Hypo-osmolality and hyponatremia: Secondary | ICD-10-CM | POA: Diagnosis not present

## 2017-09-26 DIAGNOSIS — B9689 Other specified bacterial agents as the cause of diseases classified elsewhere: Secondary | ICD-10-CM | POA: Diagnosis not present

## 2017-09-26 LAB — CULTURE, BLOOD (ROUTINE X 2)
CULTURE: NO GROWTH
CULTURE: NO GROWTH
SPECIAL REQUESTS: ADEQUATE
SPECIAL REQUESTS: ADEQUATE

## 2017-09-27 DIAGNOSIS — E876 Hypokalemia: Secondary | ICD-10-CM | POA: Diagnosis not present

## 2017-09-27 DIAGNOSIS — R9431 Abnormal electrocardiogram [ECG] [EKG]: Secondary | ICD-10-CM | POA: Diagnosis not present

## 2017-09-27 DIAGNOSIS — E871 Hypo-osmolality and hyponatremia: Secondary | ICD-10-CM | POA: Diagnosis not present

## 2017-09-27 DIAGNOSIS — K529 Noninfective gastroenteritis and colitis, unspecified: Secondary | ICD-10-CM | POA: Diagnosis not present

## 2017-09-28 DIAGNOSIS — Z452 Encounter for adjustment and management of vascular access device: Secondary | ICD-10-CM | POA: Diagnosis not present

## 2017-09-28 DIAGNOSIS — J984 Other disorders of lung: Secondary | ICD-10-CM | POA: Diagnosis not present

## 2017-09-28 DIAGNOSIS — K521 Toxic gastroenteritis and colitis: Secondary | ICD-10-CM | POA: Diagnosis not present

## 2017-09-28 DIAGNOSIS — J851 Abscess of lung with pneumonia: Secondary | ICD-10-CM | POA: Diagnosis not present

## 2017-09-28 DIAGNOSIS — E871 Hypo-osmolality and hyponatremia: Secondary | ICD-10-CM | POA: Diagnosis not present

## 2017-09-28 DIAGNOSIS — Z978 Presence of other specified devices: Secondary | ICD-10-CM | POA: Diagnosis not present

## 2017-09-28 DIAGNOSIS — T368X5A Adverse effect of other systemic antibiotics, initial encounter: Secondary | ICD-10-CM | POA: Diagnosis not present

## 2017-09-28 DIAGNOSIS — J852 Abscess of lung without pneumonia: Secondary | ICD-10-CM | POA: Diagnosis not present

## 2017-09-28 DIAGNOSIS — B9689 Other specified bacterial agents as the cause of diseases classified elsewhere: Secondary | ICD-10-CM | POA: Diagnosis not present

## 2017-09-28 DIAGNOSIS — J948 Other specified pleural conditions: Secondary | ICD-10-CM | POA: Diagnosis not present

## 2017-09-28 DIAGNOSIS — J853 Abscess of mediastinum: Secondary | ICD-10-CM | POA: Diagnosis not present

## 2017-09-28 DIAGNOSIS — E876 Hypokalemia: Secondary | ICD-10-CM | POA: Diagnosis not present

## 2017-09-28 DIAGNOSIS — Z902 Acquired absence of lung [part of]: Secondary | ICD-10-CM | POA: Diagnosis not present

## 2017-09-28 DIAGNOSIS — E861 Hypovolemia: Secondary | ICD-10-CM | POA: Diagnosis not present

## 2017-09-29 DIAGNOSIS — K225 Diverticulum of esophagus, acquired: Secondary | ICD-10-CM | POA: Diagnosis not present

## 2017-09-29 DIAGNOSIS — Z959 Presence of cardiac and vascular implant and graft, unspecified: Secondary | ICD-10-CM | POA: Diagnosis not present

## 2017-09-29 DIAGNOSIS — B3789 Other sites of candidiasis: Secondary | ICD-10-CM | POA: Diagnosis not present

## 2017-09-29 DIAGNOSIS — K228 Other specified diseases of esophagus: Secondary | ICD-10-CM | POA: Diagnosis not present

## 2017-09-29 DIAGNOSIS — M898X8 Other specified disorders of bone, other site: Secondary | ICD-10-CM | POA: Diagnosis not present

## 2017-09-29 DIAGNOSIS — J390 Retropharyngeal and parapharyngeal abscess: Secondary | ICD-10-CM | POA: Diagnosis not present

## 2017-09-29 DIAGNOSIS — R918 Other nonspecific abnormal finding of lung field: Secondary | ICD-10-CM | POA: Diagnosis not present

## 2017-09-29 DIAGNOSIS — J852 Abscess of lung without pneumonia: Secondary | ICD-10-CM | POA: Diagnosis not present

## 2017-09-29 DIAGNOSIS — J853 Abscess of mediastinum: Secondary | ICD-10-CM | POA: Diagnosis not present

## 2017-09-29 DIAGNOSIS — J90 Pleural effusion, not elsewhere classified: Secondary | ICD-10-CM | POA: Diagnosis not present

## 2017-09-29 DIAGNOSIS — Z902 Acquired absence of lung [part of]: Secondary | ICD-10-CM | POA: Diagnosis not present

## 2017-09-29 DIAGNOSIS — J9811 Atelectasis: Secondary | ICD-10-CM | POA: Diagnosis not present

## 2017-09-29 DIAGNOSIS — B9689 Other specified bacterial agents as the cause of diseases classified elsewhere: Secondary | ICD-10-CM | POA: Diagnosis not present

## 2017-09-30 DIAGNOSIS — R9431 Abnormal electrocardiogram [ECG] [EKG]: Secondary | ICD-10-CM | POA: Diagnosis not present

## 2017-09-30 DIAGNOSIS — B3789 Other sites of candidiasis: Secondary | ICD-10-CM | POA: Diagnosis not present

## 2017-09-30 DIAGNOSIS — R918 Other nonspecific abnormal finding of lung field: Secondary | ICD-10-CM | POA: Diagnosis not present

## 2017-09-30 DIAGNOSIS — B9689 Other specified bacterial agents as the cause of diseases classified elsewhere: Secondary | ICD-10-CM | POA: Diagnosis not present

## 2017-09-30 DIAGNOSIS — M898X8 Other specified disorders of bone, other site: Secondary | ICD-10-CM | POA: Diagnosis not present

## 2017-09-30 DIAGNOSIS — J390 Retropharyngeal and parapharyngeal abscess: Secondary | ICD-10-CM | POA: Diagnosis not present

## 2017-09-30 DIAGNOSIS — K225 Diverticulum of esophagus, acquired: Secondary | ICD-10-CM | POA: Diagnosis not present

## 2017-09-30 DIAGNOSIS — R197 Diarrhea, unspecified: Secondary | ICD-10-CM | POA: Diagnosis not present

## 2017-09-30 DIAGNOSIS — Z959 Presence of cardiac and vascular implant and graft, unspecified: Secondary | ICD-10-CM | POA: Diagnosis not present

## 2017-09-30 DIAGNOSIS — K228 Other specified diseases of esophagus: Secondary | ICD-10-CM | POA: Diagnosis not present

## 2017-09-30 DIAGNOSIS — Z902 Acquired absence of lung [part of]: Secondary | ICD-10-CM | POA: Diagnosis not present

## 2017-09-30 DIAGNOSIS — J853 Abscess of mediastinum: Secondary | ICD-10-CM | POA: Diagnosis not present

## 2017-09-30 DIAGNOSIS — J852 Abscess of lung without pneumonia: Secondary | ICD-10-CM | POA: Diagnosis not present

## 2017-10-01 ENCOUNTER — Ambulatory Visit: Payer: Medicare Other | Admitting: Cardiology

## 2017-10-01 DIAGNOSIS — K228 Other specified diseases of esophagus: Secondary | ICD-10-CM | POA: Diagnosis not present

## 2017-10-01 DIAGNOSIS — B9689 Other specified bacterial agents as the cause of diseases classified elsewhere: Secondary | ICD-10-CM | POA: Diagnosis not present

## 2017-10-01 DIAGNOSIS — J852 Abscess of lung without pneumonia: Secondary | ICD-10-CM | POA: Diagnosis not present

## 2017-10-01 DIAGNOSIS — J853 Abscess of mediastinum: Secondary | ICD-10-CM | POA: Diagnosis not present

## 2017-10-01 DIAGNOSIS — K225 Diverticulum of esophagus, acquired: Secondary | ICD-10-CM | POA: Diagnosis not present

## 2017-10-01 DIAGNOSIS — M898X8 Other specified disorders of bone, other site: Secondary | ICD-10-CM | POA: Diagnosis not present

## 2017-10-01 DIAGNOSIS — B3789 Other sites of candidiasis: Secondary | ICD-10-CM | POA: Diagnosis not present

## 2017-10-01 DIAGNOSIS — J390 Retropharyngeal and parapharyngeal abscess: Secondary | ICD-10-CM | POA: Diagnosis not present

## 2017-10-01 DIAGNOSIS — Z902 Acquired absence of lung [part of]: Secondary | ICD-10-CM | POA: Diagnosis not present

## 2017-10-01 DIAGNOSIS — Z959 Presence of cardiac and vascular implant and graft, unspecified: Secondary | ICD-10-CM | POA: Diagnosis not present

## 2017-10-01 DIAGNOSIS — R918 Other nonspecific abnormal finding of lung field: Secondary | ICD-10-CM | POA: Diagnosis not present

## 2017-10-02 DIAGNOSIS — K225 Diverticulum of esophagus, acquired: Secondary | ICD-10-CM | POA: Diagnosis not present

## 2017-10-02 DIAGNOSIS — J853 Abscess of mediastinum: Secondary | ICD-10-CM | POA: Diagnosis not present

## 2017-10-02 DIAGNOSIS — B9689 Other specified bacterial agents as the cause of diseases classified elsewhere: Secondary | ICD-10-CM | POA: Diagnosis not present

## 2017-10-02 DIAGNOSIS — K228 Other specified diseases of esophagus: Secondary | ICD-10-CM | POA: Diagnosis not present

## 2017-10-02 DIAGNOSIS — J852 Abscess of lung without pneumonia: Secondary | ICD-10-CM | POA: Diagnosis not present

## 2017-10-02 DIAGNOSIS — Z959 Presence of cardiac and vascular implant and graft, unspecified: Secondary | ICD-10-CM | POA: Diagnosis not present

## 2017-10-02 DIAGNOSIS — M4622 Osteomyelitis of vertebra, cervical region: Secondary | ICD-10-CM | POA: Diagnosis not present

## 2017-10-02 DIAGNOSIS — B379 Candidiasis, unspecified: Secondary | ICD-10-CM | POA: Diagnosis not present

## 2017-10-02 DIAGNOSIS — Z4682 Encounter for fitting and adjustment of non-vascular catheter: Secondary | ICD-10-CM | POA: Diagnosis not present

## 2017-10-03 MED ORDER — MORPHINE SULFATE 15 MG PO TABS
30.00 | ORAL_TABLET | ORAL | Status: DC
Start: 2017-10-03 — End: 2017-10-03

## 2017-10-03 MED ORDER — GENERIC EXTERNAL MEDICATION
Status: DC
Start: ? — End: 2017-10-03

## 2017-10-03 MED ORDER — INSULIN LISPRO 100 UNIT/ML ~~LOC~~ SOLN
1.00 | SUBCUTANEOUS | Status: DC
Start: 2017-10-03 — End: 2017-10-03

## 2017-10-03 MED ORDER — PROMETHAZINE HCL 12.5 MG PO TABS
12.50 | ORAL_TABLET | ORAL | Status: DC
Start: ? — End: 2017-10-03

## 2017-10-03 MED ORDER — HEPARIN SODIUM (PORCINE) 5000 UNIT/ML IJ SOLN
5000.00 | INTRAMUSCULAR | Status: DC
Start: 2017-10-03 — End: 2017-10-03

## 2017-10-03 MED ORDER — TIZANIDINE HCL 2 MG PO TABS
2.00 | ORAL_TABLET | ORAL | Status: DC
Start: ? — End: 2017-10-03

## 2017-10-03 MED ORDER — GENERIC EXTERNAL MEDICATION
1.00 g | Status: DC
Start: 2017-10-04 — End: 2017-10-03

## 2017-10-03 MED ORDER — SODIUM CHLORIDE 0.9 % IJ SOLN
20.00 | INTRAMUSCULAR | Status: DC
Start: ? — End: 2017-10-03

## 2017-10-03 MED ORDER — SENNOSIDES-DOCUSATE SODIUM 8.6-50 MG PO TABS
1.00 | ORAL_TABLET | ORAL | Status: DC
Start: 2017-10-03 — End: 2017-10-03

## 2017-10-03 MED ORDER — ONDANSETRON HCL 4 MG/2ML IJ SOLN
4.00 | INTRAMUSCULAR | Status: DC
Start: ? — End: 2017-10-03

## 2017-10-03 MED ORDER — MELATONIN 3 MG PO TABS
3.00 | ORAL_TABLET | ORAL | Status: DC
Start: ? — End: 2017-10-03

## 2017-10-03 MED ORDER — GABAPENTIN 800 MG PO TABS
800.00 | ORAL_TABLET | ORAL | Status: DC
Start: 2017-10-03 — End: 2017-10-03

## 2017-10-03 MED ORDER — GENERIC EXTERNAL MEDICATION
4.00 g | Status: DC
Start: 2017-10-03 — End: 2017-10-03

## 2017-10-03 MED ORDER — DEXTROSE 10 % IV SOLN
125.00 | INTRAVENOUS | Status: DC
Start: ? — End: 2017-10-03

## 2017-10-03 MED ORDER — FUROSEMIDE 40 MG PO TABS
40.00 | ORAL_TABLET | ORAL | Status: DC
Start: 2017-10-04 — End: 2017-10-03

## 2017-10-03 MED ORDER — VENLAFAXINE HCL 50 MG PO TABS
50.00 | ORAL_TABLET | ORAL | Status: DC
Start: 2017-10-03 — End: 2017-10-03

## 2017-10-03 MED ORDER — OXYCODONE HCL 15 MG PO TABS
15.00 | ORAL_TABLET | ORAL | Status: DC
Start: 2017-10-03 — End: 2017-10-03

## 2017-10-03 MED ORDER — DIPHENOXYLATE-ATROPINE 2.5-0.025 MG PO TABS
1.00 | ORAL_TABLET | ORAL | Status: DC
Start: ? — End: 2017-10-03

## 2017-10-03 MED ORDER — GENERIC EXTERNAL MEDICATION
2.00 | Status: DC
Start: 2017-10-03 — End: 2017-10-03

## 2017-10-03 MED ORDER — DICLOFENAC SODIUM 1 % TD GEL
2.00 g | TRANSDERMAL | Status: DC
Start: 2017-10-03 — End: 2017-10-03

## 2017-10-03 MED ORDER — CELECOXIB 100 MG PO CAPS
100.00 | ORAL_CAPSULE | ORAL | Status: DC
Start: 2017-10-03 — End: 2017-10-03

## 2017-10-03 MED ORDER — METHOCARBAMOL 500 MG PO TABS
500.00 | ORAL_TABLET | ORAL | Status: DC
Start: 2017-10-03 — End: 2017-10-03

## 2017-10-03 MED ORDER — LIDOCAINE 5 % EX PTCH
3.00 | MEDICATED_PATCH | CUTANEOUS | Status: DC
Start: 2017-10-04 — End: 2017-10-03

## 2017-10-03 MED ORDER — POLYETHYLENE GLYCOL 3350 17 G PO PACK
17.00 g | PACK | ORAL | Status: DC
Start: ? — End: 2017-10-03

## 2017-10-03 MED ORDER — ACETAMINOPHEN 500 MG PO TABS
1000.00 | ORAL_TABLET | ORAL | Status: DC
Start: 2017-10-03 — End: 2017-10-03

## 2017-10-03 MED ORDER — ALBUTEROL SULFATE HFA 108 (90 BASE) MCG/ACT IN AERS
2.00 | INHALATION_SPRAY | RESPIRATORY_TRACT | Status: DC
Start: ? — End: 2017-10-03

## 2017-10-03 MED ORDER — SODIUM CHLORIDE 0.9 % IJ SOLN
20.00 | INTRAMUSCULAR | Status: DC
Start: 2017-10-04 — End: 2017-10-03

## 2017-10-03 MED ORDER — FLUCONAZOLE 200 MG PO TABS
600.00 | ORAL_TABLET | ORAL | Status: DC
Start: ? — End: 2017-10-03

## 2017-10-03 MED ORDER — LOSARTAN POTASSIUM 25 MG PO TABS
25.00 | ORAL_TABLET | ORAL | Status: DC
Start: 2017-10-04 — End: 2017-10-03

## 2017-10-04 DIAGNOSIS — J860 Pyothorax with fistula: Secondary | ICD-10-CM | POA: Diagnosis not present

## 2017-10-04 DIAGNOSIS — Z87891 Personal history of nicotine dependence: Secondary | ICD-10-CM | POA: Diagnosis not present

## 2017-10-04 DIAGNOSIS — I10 Essential (primary) hypertension: Secondary | ICD-10-CM | POA: Diagnosis not present

## 2017-10-04 DIAGNOSIS — Z5181 Encounter for therapeutic drug level monitoring: Secondary | ICD-10-CM | POA: Diagnosis not present

## 2017-10-04 DIAGNOSIS — K225 Diverticulum of esophagus, acquired: Secondary | ICD-10-CM | POA: Diagnosis not present

## 2017-10-04 DIAGNOSIS — R1314 Dysphagia, pharyngoesophageal phase: Secondary | ICD-10-CM | POA: Diagnosis not present

## 2017-10-04 DIAGNOSIS — Z431 Encounter for attention to gastrostomy: Secondary | ICD-10-CM | POA: Diagnosis not present

## 2017-10-04 DIAGNOSIS — K228 Other specified diseases of esophagus: Secondary | ICD-10-CM | POA: Diagnosis not present

## 2017-10-04 DIAGNOSIS — I2510 Atherosclerotic heart disease of native coronary artery without angina pectoris: Secondary | ICD-10-CM | POA: Diagnosis not present

## 2017-10-04 DIAGNOSIS — Z853 Personal history of malignant neoplasm of breast: Secondary | ICD-10-CM | POA: Diagnosis not present

## 2017-10-04 DIAGNOSIS — E119 Type 2 diabetes mellitus without complications: Secondary | ICD-10-CM | POA: Diagnosis not present

## 2017-10-04 DIAGNOSIS — Z434 Encounter for attention to other artificial openings of digestive tract: Secondary | ICD-10-CM | POA: Diagnosis not present

## 2017-10-04 DIAGNOSIS — M1990 Unspecified osteoarthritis, unspecified site: Secondary | ICD-10-CM | POA: Diagnosis not present

## 2017-10-04 DIAGNOSIS — E785 Hyperlipidemia, unspecified: Secondary | ICD-10-CM | POA: Diagnosis not present

## 2017-10-04 DIAGNOSIS — Z452 Encounter for adjustment and management of vascular access device: Secondary | ICD-10-CM | POA: Diagnosis not present

## 2017-10-04 DIAGNOSIS — Z7951 Long term (current) use of inhaled steroids: Secondary | ICD-10-CM | POA: Diagnosis not present

## 2017-10-04 DIAGNOSIS — Z7982 Long term (current) use of aspirin: Secondary | ICD-10-CM | POA: Diagnosis not present

## 2017-10-04 DIAGNOSIS — Z79891 Long term (current) use of opiate analgesic: Secondary | ICD-10-CM | POA: Diagnosis not present

## 2017-10-04 DIAGNOSIS — J853 Abscess of mediastinum: Secondary | ICD-10-CM | POA: Diagnosis not present

## 2017-10-06 ENCOUNTER — Ambulatory Visit: Payer: Medicare Other | Admitting: Internal Medicine

## 2017-10-06 DIAGNOSIS — E119 Type 2 diabetes mellitus without complications: Secondary | ICD-10-CM | POA: Diagnosis not present

## 2017-10-06 DIAGNOSIS — K225 Diverticulum of esophagus, acquired: Secondary | ICD-10-CM | POA: Diagnosis not present

## 2017-10-06 DIAGNOSIS — K228 Other specified diseases of esophagus: Secondary | ICD-10-CM | POA: Diagnosis not present

## 2017-10-06 DIAGNOSIS — R1314 Dysphagia, pharyngoesophageal phase: Secondary | ICD-10-CM | POA: Diagnosis not present

## 2017-10-06 DIAGNOSIS — J860 Pyothorax with fistula: Secondary | ICD-10-CM | POA: Diagnosis not present

## 2017-10-06 DIAGNOSIS — J853 Abscess of mediastinum: Secondary | ICD-10-CM | POA: Diagnosis not present

## 2017-10-08 ENCOUNTER — Encounter: Payer: Self-pay | Admitting: Infectious Disease

## 2017-10-08 NOTE — Progress Notes (Signed)
Received call re ESR and CRP were above 100. I'm not sure why this was called as an urgent lab.

## 2017-10-08 NOTE — Progress Notes (Signed)
Called re ESR and CRP above 100. Not sure why I was called re elevated ESR, CRP

## 2017-10-09 ENCOUNTER — Other Ambulatory Visit: Payer: Self-pay | Admitting: Pharmacist

## 2017-10-12 DIAGNOSIS — R1314 Dysphagia, pharyngoesophageal phase: Secondary | ICD-10-CM | POA: Diagnosis not present

## 2017-10-12 DIAGNOSIS — J852 Abscess of lung without pneumonia: Secondary | ICD-10-CM | POA: Diagnosis not present

## 2017-10-12 DIAGNOSIS — Z0000 Encounter for general adult medical examination without abnormal findings: Secondary | ICD-10-CM | POA: Diagnosis not present

## 2017-10-12 DIAGNOSIS — K228 Other specified diseases of esophagus: Secondary | ICD-10-CM | POA: Diagnosis not present

## 2017-10-12 DIAGNOSIS — J860 Pyothorax with fistula: Secondary | ICD-10-CM | POA: Diagnosis not present

## 2017-10-12 DIAGNOSIS — K225 Diverticulum of esophagus, acquired: Secondary | ICD-10-CM | POA: Diagnosis not present

## 2017-10-12 DIAGNOSIS — Z5181 Encounter for therapeutic drug level monitoring: Secondary | ICD-10-CM | POA: Diagnosis not present

## 2017-10-12 DIAGNOSIS — Z431 Encounter for attention to gastrostomy: Secondary | ICD-10-CM | POA: Diagnosis not present

## 2017-10-12 DIAGNOSIS — J853 Abscess of mediastinum: Secondary | ICD-10-CM | POA: Diagnosis not present

## 2017-10-12 DIAGNOSIS — E119 Type 2 diabetes mellitus without complications: Secondary | ICD-10-CM | POA: Diagnosis not present

## 2017-10-12 DIAGNOSIS — Z4689 Encounter for fitting and adjustment of other specified devices: Secondary | ICD-10-CM | POA: Diagnosis not present

## 2017-10-13 ENCOUNTER — Other Ambulatory Visit (HOSPITAL_COMMUNITY): Payer: Self-pay | Admitting: Interventional Radiology

## 2017-10-13 ENCOUNTER — Telehealth (HOSPITAL_COMMUNITY): Payer: Self-pay

## 2017-10-13 DIAGNOSIS — K228 Other specified diseases of esophagus: Secondary | ICD-10-CM | POA: Diagnosis not present

## 2017-10-13 DIAGNOSIS — J852 Abscess of lung without pneumonia: Secondary | ICD-10-CM | POA: Diagnosis not present

## 2017-10-13 DIAGNOSIS — Z931 Gastrostomy status: Secondary | ICD-10-CM | POA: Diagnosis not present

## 2017-10-13 DIAGNOSIS — J853 Abscess of mediastinum: Secondary | ICD-10-CM | POA: Diagnosis not present

## 2017-10-13 DIAGNOSIS — J449 Chronic obstructive pulmonary disease, unspecified: Secondary | ICD-10-CM | POA: Diagnosis not present

## 2017-10-13 DIAGNOSIS — Z87891 Personal history of nicotine dependence: Secondary | ICD-10-CM | POA: Diagnosis not present

## 2017-10-13 DIAGNOSIS — R1310 Dysphagia, unspecified: Secondary | ICD-10-CM

## 2017-10-13 NOTE — Telephone Encounter (Signed)
Called to schedule gtube exchange, no answer, left message. AW

## 2017-10-14 ENCOUNTER — Ambulatory Visit (HOSPITAL_COMMUNITY)
Admission: RE | Admit: 2017-10-14 | Discharge: 2017-10-14 | Disposition: A | Discharge status: Home / Self Care | Payer: Medicare Other | Source: Ambulatory Visit | Attending: Interventional Radiology | Admitting: Interventional Radiology

## 2017-10-14 ENCOUNTER — Other Ambulatory Visit (HOSPITAL_COMMUNITY): Payer: Self-pay | Admitting: Interventional Radiology

## 2017-10-14 ENCOUNTER — Encounter (HOSPITAL_COMMUNITY): Payer: Self-pay | Admitting: Interventional Radiology

## 2017-10-14 DIAGNOSIS — R1310 Dysphagia, unspecified: Secondary | ICD-10-CM

## 2017-10-14 DIAGNOSIS — Q391 Atresia of esophagus with tracheo-esophageal fistula: Secondary | ICD-10-CM | POA: Diagnosis not present

## 2017-10-14 DIAGNOSIS — K9423 Gastrostomy malfunction: Secondary | ICD-10-CM | POA: Insufficient documentation

## 2017-10-14 DIAGNOSIS — K225 Diverticulum of esophagus, acquired: Secondary | ICD-10-CM | POA: Diagnosis not present

## 2017-10-14 DIAGNOSIS — J860 Pyothorax with fistula: Secondary | ICD-10-CM | POA: Diagnosis not present

## 2017-10-14 DIAGNOSIS — E119 Type 2 diabetes mellitus without complications: Secondary | ICD-10-CM | POA: Diagnosis not present

## 2017-10-14 DIAGNOSIS — K228 Other specified diseases of esophagus: Secondary | ICD-10-CM | POA: Diagnosis not present

## 2017-10-14 DIAGNOSIS — J853 Abscess of mediastinum: Secondary | ICD-10-CM | POA: Diagnosis not present

## 2017-10-14 DIAGNOSIS — R1314 Dysphagia, pharyngoesophageal phase: Secondary | ICD-10-CM | POA: Diagnosis not present

## 2017-10-14 HISTORY — PX: IR CM INJ ANY COLONIC TUBE W/FLUORO: IMG2336

## 2017-10-14 MED ORDER — LIDOCAINE VISCOUS 2 % MT SOLN
OROMUCOSAL | Status: AC
  Filled 2017-10-14: qty 15

## 2017-10-14 MED ORDER — IOPAMIDOL (ISOVUE-300) INJECTION 61%
INTRAVENOUS | Status: AC
  Administered 2017-10-14: 10 mL
  Filled 2017-10-14: qty 50

## 2017-10-14 NOTE — Progress Notes (Signed)
Patient presented to radiology department for assessment of her gastrostomy tube which has been leaking at home.  Upon assessment, the phalange is found several cm from the skin.  Injection of tube shows correct positioning; results dictated separately.  Discussed gastrostomy tube care with patient and husband including regular assessment, correct position, and best control techniques for leakage around tube.  Patient and husband verbalize understanding.  All questions answered.   Brynda Greathouse, MS RD PA-C 12:00 PM

## 2017-10-15 DIAGNOSIS — D649 Anemia, unspecified: Secondary | ICD-10-CM | POA: Diagnosis not present

## 2017-10-16 ENCOUNTER — Other Ambulatory Visit: Payer: Self-pay | Admitting: Pharmacist

## 2017-10-19 DIAGNOSIS — K228 Other specified diseases of esophagus: Secondary | ICD-10-CM | POA: Diagnosis not present

## 2017-10-19 DIAGNOSIS — J860 Pyothorax with fistula: Secondary | ICD-10-CM | POA: Diagnosis not present

## 2017-10-19 DIAGNOSIS — E119 Type 2 diabetes mellitus without complications: Secondary | ICD-10-CM | POA: Diagnosis not present

## 2017-10-19 DIAGNOSIS — K225 Diverticulum of esophagus, acquired: Secondary | ICD-10-CM | POA: Diagnosis not present

## 2017-10-19 DIAGNOSIS — R1314 Dysphagia, pharyngoesophageal phase: Secondary | ICD-10-CM | POA: Diagnosis not present

## 2017-10-19 DIAGNOSIS — J853 Abscess of mediastinum: Secondary | ICD-10-CM | POA: Diagnosis not present

## 2017-10-22 ENCOUNTER — Other Ambulatory Visit (HOSPITAL_COMMUNITY): Payer: Self-pay | Admitting: Family Medicine

## 2017-10-22 DIAGNOSIS — K9423 Gastrostomy malfunction: Secondary | ICD-10-CM

## 2017-10-23 ENCOUNTER — Other Ambulatory Visit (HOSPITAL_COMMUNITY): Payer: Self-pay | Admitting: Family Medicine

## 2017-10-23 ENCOUNTER — Other Ambulatory Visit: Payer: Self-pay | Admitting: Pharmacist

## 2017-10-23 ENCOUNTER — Ambulatory Visit (HOSPITAL_COMMUNITY)
Admission: RE | Admit: 2017-10-23 | Discharge: 2017-10-23 | Disposition: A | Discharge status: Home / Self Care | Payer: Medicare Other | Source: Ambulatory Visit | Attending: Family Medicine | Admitting: Family Medicine

## 2017-10-23 DIAGNOSIS — K9429 Other complications of gastrostomy: Secondary | ICD-10-CM | POA: Diagnosis not present

## 2017-10-23 DIAGNOSIS — K9423 Gastrostomy malfunction: Secondary | ICD-10-CM

## 2017-10-23 DIAGNOSIS — Z431 Encounter for attention to gastrostomy: Secondary | ICD-10-CM | POA: Diagnosis not present

## 2017-10-23 HISTORY — PX: IR CM INJ ANY COLONIC TUBE W/FLUORO: IMG2336

## 2017-10-23 MED ORDER — LIDOCAINE VISCOUS 2 % MT SOLN
OROMUCOSAL | Status: AC
  Filled 2017-10-23: qty 15

## 2017-10-23 MED ORDER — IOPAMIDOL (ISOVUE-300) INJECTION 61%
INTRAVENOUS | Status: AC
  Administered 2017-10-23: 15 mL
  Filled 2017-10-23: qty 50

## 2017-10-26 ENCOUNTER — Ambulatory Visit (INDEPENDENT_AMBULATORY_CARE_PROVIDER_SITE_OTHER): Payer: Medicare Other | Admitting: Internal Medicine

## 2017-10-26 ENCOUNTER — Other Ambulatory Visit (HOSPITAL_COMMUNITY): Payer: Self-pay | Admitting: Family Medicine

## 2017-10-26 ENCOUNTER — Encounter (HOSPITAL_COMMUNITY): Payer: Self-pay

## 2017-10-26 ENCOUNTER — Telehealth: Payer: Self-pay | Admitting: *Deleted

## 2017-10-26 VITALS — BP 118/69 | HR 76 | Temp 98.8°F | Wt 131.0 lb

## 2017-10-26 DIAGNOSIS — J860 Pyothorax with fistula: Secondary | ICD-10-CM | POA: Diagnosis not present

## 2017-10-26 DIAGNOSIS — K228 Other specified diseases of esophagus: Secondary | ICD-10-CM | POA: Diagnosis not present

## 2017-10-26 DIAGNOSIS — R1314 Dysphagia, pharyngoesophageal phase: Secondary | ICD-10-CM | POA: Diagnosis not present

## 2017-10-26 DIAGNOSIS — K2289 Other specified disease of esophagus: Secondary | ICD-10-CM

## 2017-10-26 DIAGNOSIS — K9423 Gastrostomy malfunction: Secondary | ICD-10-CM

## 2017-10-26 DIAGNOSIS — L0211 Cutaneous abscess of neck: Secondary | ICD-10-CM

## 2017-10-26 DIAGNOSIS — K225 Diverticulum of esophagus, acquired: Secondary | ICD-10-CM | POA: Diagnosis not present

## 2017-10-26 DIAGNOSIS — Q396 Congenital diverticulum of esophagus: Secondary | ICD-10-CM

## 2017-10-26 DIAGNOSIS — J853 Abscess of mediastinum: Secondary | ICD-10-CM | POA: Diagnosis not present

## 2017-10-26 DIAGNOSIS — E119 Type 2 diabetes mellitus without complications: Secondary | ICD-10-CM | POA: Diagnosis not present

## 2017-10-26 NOTE — Telephone Encounter (Signed)
Patient seen in the office today and per Dr Baxter Flattery called [facility] to advise to extend her IV medication until 11/18/17.

## 2017-10-26 NOTE — Progress Notes (Signed)
Esophageal fistula   . Abscess   . Discitis   . HCAP (healthcare-associated pneumonia)   . 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[place]) 04/28/2015  . Abscess of neck   . Esophageal perforation   . Loss of weight   . Esophageal dysphagia   . Recurrent dislocation of hip joint prosthesis ([place]) 04/26/2015  . Hip dislocation, right ([place])  04/26/2015  . Healthcare-associated pneumonia 04/26/2015  . Cholecystitis, acute 04/26/2015  . 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[place]) 04/26/2015  . PAF (paroxysmal atrial fibrillation) ([place]) 09/20/2014  . Elevated troponin 09/20/2014  . Acute renal failure syndrome ([place])   . Malnutrition of moderate degree ([place]) 08/29/2014  . Pneumonia   . Respiratory failure ([place])   . UTI (urinary tract infection) 08/27/2014  . Sepsis, unspecified organism ([place]) 08/27/2014  . Atrial fibrillation with RVR ([place]) 08/27/2014  . Shock circulatory ([place]) 08/27/2014  . Acute kidney injury ([place]) 08/27/2014  . Encephalopathy acute 08/27/2014  . Hypokalemia 08/26/2014  . Hyponatremia 08/26/2014  . Acute renal failure ([place]) 08/26/2014  . Weakness 08/26/2014  . ETOH abuse 08/07/2014  . Edema extremities 07/12/2014  . Atelectasis 08/16/2011  . Diabetes mellitus type 2, insulin dependent ([place])   . COPD (chronic obstructive pulmonary disease) ([place])   . Tobacco dependence   . Hyperlipidemia   . Essential hypertension   . Coronary artery disease      Health Maintenance Due  Topic Date Due  . Hepatitis C Screening  Aug 20, 1946  . FOOT EXAM  02/09/1958  . OPHTHALMOLOGY EXAM  02/09/1958  . COLONOSCOPY  02/09/1998  . DEXA SCAN  02/09/2013  . PNA vac Low Risk Adult (2 of 2 - PPSV23) 02/09/2013  . MAMMOGRAM  10/10/2016  . HEMOGLOBIN A1C  05/10/2017     Review of Systems  Physical Exam   BP 118/69   Pulse 76   Temp 98.8 F (37.1 C) (Oral)   Wt 131 lb (59.4 kg)   BMI 23.96 kg/m    Physical Exam  Constitutional:  oriented to person, place, and time. appears well-developed and well-nourished. No distress.  HENT: [place]/AT, PERRLA, no scleral icterus Mouth/Throat: Oropharynx is clear and moist. No oropharyngeal exudate.  Cardiovascular: Normal rate, regular rhythm and normal heart sounds. Exam reveals no gallop and no friction rub.  No murmur heard.  Pulmonary/Chest: Effort normal and breath sounds normal. No  respiratory distress.  has no wheezes.  Neck = supple, no nuchal rigidity Chest wall = no surrounding erythema to incisional area though moist tissue is sticking to her bandage with yellowish, milky drainage Abdominal: Soft. Bowel sounds are normal.  exhibits no distension. There is no tenderness. Peg in place mild  Lymphadenopathy: no cervical adenopathy. No axillary adenopathy Neurological: alert and oriented to person, place, and time.  Skin: Skin is warm and dry. No rash noted. No erythema.  Psychiatric: a normal mood and affect.  behavior is normal.   CBC Lab Results  Component Value Date   WBC 10.0 09/22/2017   RBC 3.59 (L) 09/22/2017   HGB 10.0 (L) 09/22/2017   HCT 30.5 (L) 09/22/2017   PLT 370 09/22/2017   MCV 85.0 09/22/2017   MCH 27.9 09/22/2017   MCHC 32.8 09/22/2017   RDW 12.9 09/22/2017   LYMPHSABS 1.0 09/22/2017   MONOABS 0.8 09/22/2017   EOSABS 0.0 09/22/2017    BMET Lab Results  Component Value Date   NA 131 (L) 09/22/2017   K 3.7 09/22/2017   CL 92 (L) 09/22/2017   CO2 24 09/22/2017  Esophageal fistula   . Abscess   . Discitis   . HCAP (healthcare-associated pneumonia)   . 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[place]) 04/28/2015  . Abscess of neck   . Esophageal perforation   . Loss of weight   . Esophageal dysphagia   . Recurrent dislocation of hip joint prosthesis ([place]) 04/26/2015  . Hip dislocation, right ([place])  04/26/2015  . Healthcare-associated pneumonia 04/26/2015  . Cholecystitis, acute 04/26/2015  . 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[place]) 04/26/2015  . PAF (paroxysmal atrial fibrillation) ([place]) 09/20/2014  . Elevated troponin 09/20/2014  . Acute renal failure syndrome ([place])   . Malnutrition of moderate degree ([place]) 08/29/2014  . Pneumonia   . Respiratory failure ([place])   . UTI (urinary tract infection) 08/27/2014  . Sepsis, unspecified organism ([place]) 08/27/2014  . Atrial fibrillation with RVR ([place]) 08/27/2014  . Shock circulatory ([place]) 08/27/2014  . Acute kidney injury ([place]) 08/27/2014  . Encephalopathy acute 08/27/2014  . Hypokalemia 08/26/2014  . Hyponatremia 08/26/2014  . Acute renal failure ([place]) 08/26/2014  . Weakness 08/26/2014  . ETOH abuse 08/07/2014  . Edema extremities 07/12/2014  . Atelectasis 08/16/2011  . Diabetes mellitus type 2, insulin dependent ([place])   . COPD (chronic obstructive pulmonary disease) ([place])   . Tobacco dependence   . Hyperlipidemia   . Essential hypertension   . Coronary artery disease      Health Maintenance Due  Topic Date Due  . Hepatitis C Screening  Aug 20, 1946  . FOOT EXAM  02/09/1958  . OPHTHALMOLOGY EXAM  02/09/1958  . COLONOSCOPY  02/09/1998  . DEXA SCAN  02/09/2013  . PNA vac Low Risk Adult (2 of 2 - PPSV23) 02/09/2013  . MAMMOGRAM  10/10/2016  . HEMOGLOBIN A1C  05/10/2017     Review of Systems  Physical Exam   BP 118/69   Pulse 76   Temp 98.8 F (37.1 C) (Oral)   Wt 131 lb (59.4 kg)   BMI 23.96 kg/m    Physical Exam  Constitutional:  oriented to person, place, and time. appears well-developed and well-nourished. No distress.  HENT: [place]/AT, PERRLA, no scleral icterus Mouth/Throat: Oropharynx is clear and moist. No oropharyngeal exudate.  Cardiovascular: Normal rate, regular rhythm and normal heart sounds. Exam reveals no gallop and no friction rub.  No murmur heard.  Pulmonary/Chest: Effort normal and breath sounds normal. No  respiratory distress.  has no wheezes.  Neck = supple, no nuchal rigidity Chest wall = no surrounding erythema to incisional area though moist tissue is sticking to her bandage with yellowish, milky drainage Abdominal: Soft. Bowel sounds are normal.  exhibits no distension. There is no tenderness. Peg in place mild  Lymphadenopathy: no cervical adenopathy. No axillary adenopathy Neurological: alert and oriented to person, place, and time.  Skin: Skin is warm and dry. No rash noted. No erythema.  Psychiatric: a normal mood and affect.  behavior is normal.   CBC Lab Results  Component Value Date   WBC 10.0 09/22/2017   RBC 3.59 (L) 09/22/2017   HGB 10.0 (L) 09/22/2017   HCT 30.5 (L) 09/22/2017   PLT 370 09/22/2017   MCV 85.0 09/22/2017   MCH 27.9 09/22/2017   MCHC 32.8 09/22/2017   RDW 12.9 09/22/2017   LYMPHSABS 1.0 09/22/2017   MONOABS 0.8 09/22/2017   EOSABS 0.0 09/22/2017    BMET Lab Results  Component Value Date   NA 131 (L) 09/22/2017   K 3.7 09/22/2017   CL 92 (L) 09/22/2017   CO2 24 09/22/2017  RFV: eso fistula/abscess  Patient ID: [name], female   DOB: October 08, 1945, 72 y.o.   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 In early January, she developed fevers, and chills,  She was found to have CT imaging demonstrating abscess in the superior mediastinum contiguous with left apical lung abscess and likely reflecting recurrent esophageal perforation with abscess. She was transferred to [facility] with dr Vicie Mutters for further management of her left pharyngeal fistula  With LUL abscess s/p 09/24/17 VATS underwent thoracoscopic exploration with removal left upper lobe abscess and drainage of posterior mediastinal abscess/fistulous tract. She was last seen in dr [facility] on 10/12/17. Her OR cx grew enterobacter cloacae and c.tropicalis. ID consultans at [facility] recommended treatment with ertapenem plus oral fluconazole 600mg  daily which she has been taking since surgery. She states that her main complaint is that her g-tube is leaking inconsistently. Sometimes directly after infusion of food and other times, hours after infusion. She is quite frustrated by this. Has been back to IR for recent tightening. She denies chills, fevers. Still has some purulent drainage from her incision site at left subclavicular region.    In review her records from her surgery in January plus home health records, her sed rate >100 at end of January.she remains on ertapenem plus fluconazole.  Has upcoming follow up with surgeons at [facility] Outpatient Encounter Medications as of 10/26/2017  Medication Sig  . albuterol (PROVENTIL HFA;VENTOLIN HFA) 108 (90 BASE) MCG/ACT inhaler Inhale 2 puffs into the lungs every 6 (six) hours as needed for wheezing.   Marland Kitchen ALPRAZolam (XANAX) 0.25 MG tablet Take one tablet daily or as needed  . amoxicillin-clavulanate (AUGMENTIN)  875-125 MG tablet Take 1 tablet by mouth 2 (two) times daily.  . budesonide-formoterol (SYMBICORT) 160-4.5 MCG/ACT inhaler Inhale 2 puffs into the lungs 2 (two) times daily as needed (Wheezing). Reported on 10/16/2015  . cholestyramine light (PREVALITE) 4 g packet Take 1 packet (4 g total) by mouth every 12 (twelve) hours.  . diclofenac (VOLTAREN) 75 MG EC tablet Take 75 mg by mouth daily as needed for moderate pain.   . fluconazole (DIFLUCAN) 2 mg/mL SOLN IVPB Inject 50 mLs (100 mg total) into the vein daily.  Marland Kitchen gabapentin (NEURONTIN) 600 MG tablet Take 600 mg by mouth 4 (four) times daily.   Marland Kitchen losartan (COZAAR) 25 MG tablet Take 25 mg by mouth daily.   Marland Kitchen morphine (MS CONTIN) 30 MG 12 hr tablet Take 30 mg by mouth 4 (four) times daily.   Marland Kitchen oxyCODONE (ROXICODONE) 15 MG immediate release tablet Take 15 mg by mouth 4 (four) times daily.  . piperacillin-tazobactam (ZOSYN) 3.375 GM/50ML IVPB Inject 50 mLs (3.375 g total) into the vein every 8 (eight) hours.  . Vancomycin (VANCOCIN) 750-5 MG/150ML-% SOLN Inject 150 mLs (750 mg total) into the vein daily. (Patient not taking: Reported on 10/26/2017)   No facility-administered encounter medications on file as of 10/26/2017.      Patient Active Problem List   Diagnosis Date Noted  . Pressure injury of skin 09/22/2017  . Chronic pain 09/20/2017  . Abscess of upper lobe of left lung with pneumonia ([place])   . Tobacco consumption 02/12/2016  . Hoarseness of voice 01/07/2016  . 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 Infected wound, initial encounter 06/20/2015  . Cholecystitis   .

## 2017-10-27 ENCOUNTER — Other Ambulatory Visit (HOSPITAL_COMMUNITY): Payer: Self-pay | Admitting: Interventional Radiology

## 2017-10-27 DIAGNOSIS — R1310 Dysphagia, unspecified: Secondary | ICD-10-CM

## 2017-10-28 ENCOUNTER — Other Ambulatory Visit (HOSPITAL_COMMUNITY): Payer: Self-pay | Admitting: Interventional Radiology

## 2017-10-28 ENCOUNTER — Encounter (HOSPITAL_COMMUNITY): Payer: Self-pay | Admitting: Interventional Radiology

## 2017-10-28 ENCOUNTER — Ambulatory Visit (HOSPITAL_COMMUNITY)
Admission: RE | Admit: 2017-10-28 | Discharge: 2017-10-28 | Disposition: A | Discharge status: Home / Self Care | Payer: Medicare Other | Source: Ambulatory Visit | Attending: Interventional Radiology | Admitting: Interventional Radiology

## 2017-10-28 DIAGNOSIS — R1310 Dysphagia, unspecified: Secondary | ICD-10-CM

## 2017-10-28 DIAGNOSIS — Y733 Surgical instruments, materials and gastroenterology and urology devices (including sutures) associated with adverse incidents: Secondary | ICD-10-CM | POA: Diagnosis not present

## 2017-10-28 DIAGNOSIS — K9423 Gastrostomy malfunction: Secondary | ICD-10-CM | POA: Insufficient documentation

## 2017-10-28 HISTORY — PX: IR CM INJ ANY COLONIC TUBE W/FLUORO: IMG2336

## 2017-10-28 MED ORDER — LIDOCAINE VISCOUS 2 % MT SOLN
OROMUCOSAL | Status: AC
  Filled 2017-10-28: qty 15

## 2017-10-28 MED ORDER — IOPAMIDOL (ISOVUE-300) INJECTION 61%
INTRAVENOUS | Status: AC
  Administered 2017-10-28: 10 mL
  Filled 2017-10-28: qty 50

## 2017-10-28 NOTE — Procedures (Signed)
Pre procedural Dx: Dysphagia Post procedural Dx: Same  Appropriately positioned and functioning 18 French gastrostomy tube.  No exchange performed.   EBL: Minimal  Complications: None immediate  Again, the patient's gastrostomy tube external disc appears to be loose and was cinched against the entrance site of the gastrostomy tube.  Otherwise, no explanation for patient's persistent peri gastrostomy leakage.   PLAN:  - I stressed the importance of cinching the gastrostomy disc to decrease leakage.  I also discussed the importance of performing feeding in a semi recumbent position to decrease the chances of gravity related gastrostomy leaking.   If patient has gastrostomy tube continues to leak, would recommend fluoroscopic guided exchange and up sizing.  Ronny Bacon, MD Pager #: [phone]

## 2017-10-29 DIAGNOSIS — J853 Abscess of mediastinum: Secondary | ICD-10-CM | POA: Diagnosis not present

## 2017-10-29 DIAGNOSIS — J860 Pyothorax with fistula: Secondary | ICD-10-CM | POA: Diagnosis not present

## 2017-10-29 DIAGNOSIS — R1314 Dysphagia, pharyngoesophageal phase: Secondary | ICD-10-CM | POA: Diagnosis not present

## 2017-10-29 DIAGNOSIS — E119 Type 2 diabetes mellitus without complications: Secondary | ICD-10-CM | POA: Diagnosis not present

## 2017-10-29 DIAGNOSIS — K228 Other specified diseases of esophagus: Secondary | ICD-10-CM | POA: Diagnosis not present

## 2017-10-29 DIAGNOSIS — K225 Diverticulum of esophagus, acquired: Secondary | ICD-10-CM | POA: Diagnosis not present

## 2017-10-30 DIAGNOSIS — Z9049 Acquired absence of other specified parts of digestive tract: Secondary | ICD-10-CM | POA: Diagnosis not present

## 2017-10-30 DIAGNOSIS — Z959 Presence of cardiac and vascular implant and graft, unspecified: Secondary | ICD-10-CM | POA: Diagnosis not present

## 2017-10-30 DIAGNOSIS — Z452 Encounter for adjustment and management of vascular access device: Secondary | ICD-10-CM | POA: Diagnosis not present

## 2017-10-30 DIAGNOSIS — R918 Other nonspecific abnormal finding of lung field: Secondary | ICD-10-CM | POA: Diagnosis not present

## 2017-10-30 DIAGNOSIS — Z48813 Encounter for surgical aftercare following surgery on the respiratory system: Secondary | ICD-10-CM | POA: Diagnosis not present

## 2017-10-30 DIAGNOSIS — Z9889 Other specified postprocedural states: Secondary | ICD-10-CM | POA: Diagnosis not present

## 2017-10-30 DIAGNOSIS — J948 Other specified pleural conditions: Secondary | ICD-10-CM | POA: Diagnosis not present

## 2017-11-02 DIAGNOSIS — K225 Diverticulum of esophagus, acquired: Secondary | ICD-10-CM | POA: Diagnosis not present

## 2017-11-02 DIAGNOSIS — R1314 Dysphagia, pharyngoesophageal phase: Secondary | ICD-10-CM | POA: Diagnosis not present

## 2017-11-02 DIAGNOSIS — J853 Abscess of mediastinum: Secondary | ICD-10-CM | POA: Diagnosis not present

## 2017-11-02 DIAGNOSIS — E119 Type 2 diabetes mellitus without complications: Secondary | ICD-10-CM | POA: Diagnosis not present

## 2017-11-02 DIAGNOSIS — J860 Pyothorax with fistula: Secondary | ICD-10-CM | POA: Diagnosis not present

## 2017-11-02 DIAGNOSIS — K228 Other specified diseases of esophagus: Secondary | ICD-10-CM | POA: Diagnosis not present

## 2017-11-06 ENCOUNTER — Other Ambulatory Visit: Payer: Self-pay | Admitting: Pharmacist

## 2017-11-09 DIAGNOSIS — J853 Abscess of mediastinum: Secondary | ICD-10-CM | POA: Diagnosis not present

## 2017-11-09 DIAGNOSIS — J860 Pyothorax with fistula: Secondary | ICD-10-CM | POA: Diagnosis not present

## 2017-11-09 DIAGNOSIS — K225 Diverticulum of esophagus, acquired: Secondary | ICD-10-CM | POA: Diagnosis not present

## 2017-11-09 DIAGNOSIS — K228 Other specified diseases of esophagus: Secondary | ICD-10-CM | POA: Diagnosis not present

## 2017-11-09 DIAGNOSIS — R1314 Dysphagia, pharyngoesophageal phase: Secondary | ICD-10-CM | POA: Diagnosis not present

## 2017-11-09 DIAGNOSIS — E119 Type 2 diabetes mellitus without complications: Secondary | ICD-10-CM | POA: Diagnosis not present

## 2017-11-10 ENCOUNTER — Other Ambulatory Visit: Payer: Self-pay | Admitting: Pharmacist

## 2017-11-10 DIAGNOSIS — J853 Abscess of mediastinum: Secondary | ICD-10-CM | POA: Diagnosis not present

## 2017-11-10 DIAGNOSIS — J852 Abscess of lung without pneumonia: Secondary | ICD-10-CM | POA: Diagnosis not present

## 2017-11-11 ENCOUNTER — Telehealth: Payer: Self-pay | Admitting: *Deleted

## 2017-11-11 DIAGNOSIS — K9429 Other complications of gastrostomy: Secondary | ICD-10-CM | POA: Diagnosis present

## 2017-11-11 DIAGNOSIS — G8929 Other chronic pain: Secondary | ICD-10-CM | POA: Diagnosis not present

## 2017-11-11 DIAGNOSIS — Z87891 Personal history of nicotine dependence: Secondary | ICD-10-CM | POA: Diagnosis not present

## 2017-11-11 DIAGNOSIS — Z9181 History of falling: Secondary | ICD-10-CM | POA: Diagnosis not present

## 2017-11-11 DIAGNOSIS — J449 Chronic obstructive pulmonary disease, unspecified: Secondary | ICD-10-CM | POA: Diagnosis present

## 2017-11-11 DIAGNOSIS — I252 Old myocardial infarction: Secondary | ICD-10-CM | POA: Diagnosis not present

## 2017-11-11 DIAGNOSIS — K228 Other specified diseases of esophagus: Secondary | ICD-10-CM | POA: Diagnosis not present

## 2017-11-11 DIAGNOSIS — R1310 Dysphagia, unspecified: Secondary | ICD-10-CM | POA: Diagnosis not present

## 2017-11-11 DIAGNOSIS — I340 Nonrheumatic mitral (valve) insufficiency: Secondary | ICD-10-CM | POA: Diagnosis present

## 2017-11-11 DIAGNOSIS — Z8701 Personal history of pneumonia (recurrent): Secondary | ICD-10-CM | POA: Diagnosis not present

## 2017-11-11 DIAGNOSIS — Z6822 Body mass index (BMI) 22.0-22.9, adult: Secondary | ICD-10-CM | POA: Diagnosis not present

## 2017-11-11 DIAGNOSIS — E119 Type 2 diabetes mellitus without complications: Secondary | ICD-10-CM | POA: Diagnosis not present

## 2017-11-11 DIAGNOSIS — J390 Retropharyngeal and parapharyngeal abscess: Secondary | ICD-10-CM | POA: Diagnosis present

## 2017-11-11 DIAGNOSIS — Z902 Acquired absence of lung [part of]: Secondary | ICD-10-CM | POA: Diagnosis not present

## 2017-11-11 DIAGNOSIS — Z79899 Other long term (current) drug therapy: Secondary | ICD-10-CM | POA: Diagnosis not present

## 2017-11-11 DIAGNOSIS — E785 Hyperlipidemia, unspecified: Secondary | ICD-10-CM | POA: Diagnosis not present

## 2017-11-11 DIAGNOSIS — I480 Paroxysmal atrial fibrillation: Secondary | ICD-10-CM | POA: Diagnosis present

## 2017-11-11 DIAGNOSIS — I2510 Atherosclerotic heart disease of native coronary artery without angina pectoris: Secondary | ICD-10-CM | POA: Diagnosis not present

## 2017-11-11 DIAGNOSIS — L02213 Cutaneous abscess of chest wall: Secondary | ICD-10-CM | POA: Diagnosis not present

## 2017-11-11 DIAGNOSIS — Z79891 Long term (current) use of opiate analgesic: Secondary | ICD-10-CM | POA: Diagnosis not present

## 2017-11-11 DIAGNOSIS — J860 Pyothorax with fistula: Secondary | ICD-10-CM | POA: Diagnosis not present

## 2017-11-11 DIAGNOSIS — K222 Esophageal obstruction: Secondary | ICD-10-CM | POA: Diagnosis not present

## 2017-11-11 DIAGNOSIS — R1314 Dysphagia, pharyngoesophageal phase: Secondary | ICD-10-CM | POA: Diagnosis present

## 2017-11-11 DIAGNOSIS — K9189 Other postprocedural complications and disorders of digestive system: Secondary | ICD-10-CM | POA: Diagnosis not present

## 2017-11-11 DIAGNOSIS — M1990 Unspecified osteoarthritis, unspecified site: Secondary | ICD-10-CM | POA: Diagnosis present

## 2017-11-11 DIAGNOSIS — I10 Essential (primary) hypertension: Secondary | ICD-10-CM | POA: Diagnosis not present

## 2017-11-11 DIAGNOSIS — K9423 Gastrostomy malfunction: Secondary | ICD-10-CM | POA: Diagnosis not present

## 2017-11-11 DIAGNOSIS — J852 Abscess of lung without pneumonia: Secondary | ICD-10-CM | POA: Diagnosis not present

## 2017-11-11 DIAGNOSIS — E440 Moderate protein-calorie malnutrition: Secondary | ICD-10-CM | POA: Diagnosis not present

## 2017-11-11 DIAGNOSIS — Z955 Presence of coronary angioplasty implant and graft: Secondary | ICD-10-CM | POA: Diagnosis not present

## 2017-11-11 DIAGNOSIS — Z7951 Long term (current) use of inhaled steroids: Secondary | ICD-10-CM | POA: Diagnosis not present

## 2017-11-11 NOTE — Telephone Encounter (Signed)
Patient called asking for advice. She was washing her hair in the kitchen sink when she felt a wet spot under her arm. She looked in the mirror and saw a new wound open and draining under her arm. This is the previously-healed site where a chest tube was placed last month after lung wedge/resection.  She describes the drainage as foul and states it looks infected.  She has contacted her surgeon and ENT at [facility], but has not yet heard back from them. She is asking for advice. RN contacted Dr Waynard Edwards office on patient's behalf, spoke with Judson Roch RN in triage. She stated that Dr Vicie Mutters advised [name] to call us for new antibiotic recommendations and to contact the cardiothoracic surgeon.  RN asked for the surgeon's contact number, spoke with Lanny Cramp.  RN advised that [name] would be coming to [facility] ER for surgical evaluation of wound that reopened.  Abigail Butts will send Dr Lianne Moris a message with this information. RN spoke with [name]'s husband, confirmed that she should go to [facility] ER for evaluation.   ENT Dr Vicie Mutters: [phone] Cardiothoracic Surgeon Dr Lianne Moris: [phone] Landis Gandy, RN

## 2017-11-12 ENCOUNTER — Telehealth: Payer: Self-pay | Admitting: *Deleted

## 2017-11-12 NOTE — Telephone Encounter (Signed)
Husband called to let Dr Baxter Flattery know that she is in the hospital at [facility] under the care of Dr Ardyth Harps. He advised she is in bad shape and will be there for a while. Will relay message to [place].

## 2017-11-16 NOTE — Telephone Encounter (Signed)
Thank you for the update!

## 2017-11-16 NOTE — Telephone Encounter (Signed)
Thank you for getting her back to her surgeons who needed to see her

## 2017-11-18 ENCOUNTER — Ambulatory Visit: Payer: Medicare Other | Admitting: Internal Medicine

## 2017-11-20 MED ORDER — LOSARTAN POTASSIUM 25 MG PO TABS
25.00 | ORAL_TABLET | ORAL | Status: DC
Start: 2017-11-21 — End: 2017-11-20

## 2017-11-20 MED ORDER — ACETAMINOPHEN 325 MG PO TABS
650.00 | ORAL_TABLET | ORAL | Status: DC
Start: ? — End: 2017-11-20

## 2017-11-20 MED ORDER — GABAPENTIN 800 MG PO TABS
800.00 | ORAL_TABLET | ORAL | Status: DC
Start: 2017-11-20 — End: 2017-11-20

## 2017-11-20 MED ORDER — GENERIC EXTERNAL MEDICATION
4.00 g | Status: DC
Start: 2017-11-20 — End: 2017-11-20

## 2017-11-20 MED ORDER — ENOXAPARIN SODIUM 40 MG/0.4ML ~~LOC~~ SOLN
40.00 | SUBCUTANEOUS | Status: DC
Start: 2017-11-21 — End: 2017-11-20

## 2017-11-20 MED ORDER — GENERIC EXTERNAL MEDICATION
2.00 | Status: DC
Start: 2017-11-20 — End: 2017-11-20

## 2017-11-20 MED ORDER — SORBITOL 70 % RE SOLN
30.00 | RECTAL | Status: DC
Start: ? — End: 2017-11-20

## 2017-11-20 MED ORDER — DIPHENOXYLATE-ATROPINE 2.5-0.025 MG/5ML PO LIQD
5.00 | ORAL | Status: DC
Start: ? — End: 2017-11-20

## 2017-11-20 MED ORDER — POTASSIUM CHLORIDE 20 MEQ PO PACK
PACK | ORAL | Status: DC
Start: 2017-11-20 — End: 2017-11-20

## 2017-11-20 MED ORDER — BISACODYL 10 MG RE SUPP
10.00 | RECTAL | Status: DC
Start: ? — End: 2017-11-20

## 2017-11-20 MED ORDER — FUROSEMIDE 40 MG PO TABS
40.00 | ORAL_TABLET | ORAL | Status: DC
Start: 2017-11-21 — End: 2017-11-20

## 2017-11-20 MED ORDER — POLYETHYLENE GLYCOL 3350 17 G PO PACK
17.00 g | PACK | ORAL | Status: DC
Start: ? — End: 2017-11-20

## 2017-11-20 MED ORDER — OXYCODONE HCL 15 MG PO TABS
15.00 | ORAL_TABLET | ORAL | Status: DC
Start: 2017-11-20 — End: 2017-11-20

## 2017-11-20 MED ORDER — GENERIC EXTERNAL MEDICATION
3.38 g | Status: DC
Start: 2017-11-20 — End: 2017-11-20

## 2017-11-20 MED ORDER — ALBUTEROL SULFATE HFA 108 (90 BASE) MCG/ACT IN AERS
2.00 | INHALATION_SPRAY | RESPIRATORY_TRACT | Status: DC
Start: ? — End: 2017-11-20

## 2017-11-20 MED ORDER — VENLAFAXINE HCL 50 MG PO TABS
50.00 | ORAL_TABLET | ORAL | Status: DC
Start: 2017-11-20 — End: 2017-11-20

## 2017-11-20 MED ORDER — HYDRALAZINE HCL 20 MG/ML IJ SOLN
10.00 | INTRAMUSCULAR | Status: DC
Start: ? — End: 2017-11-20

## 2017-11-20 MED ORDER — GENERIC EXTERNAL MEDICATION
1.00 | Status: DC
Start: 2017-11-21 — End: 2017-11-20

## 2017-11-20 MED ORDER — FLUCONAZOLE IN SODIUM CHLORIDE 400-0.9 MG/200ML-% IV SOLN
400.00 | INTRAVENOUS | Status: DC
Start: 2017-11-20 — End: 2017-11-20

## 2017-11-20 MED ORDER — CELECOXIB 100 MG PO CAPS
100.00 | ORAL_CAPSULE | ORAL | Status: DC
Start: 2017-11-20 — End: 2017-11-20

## 2017-11-20 MED ORDER — ONDANSETRON HCL 4 MG/2ML IJ SOLN
4.00 | INTRAMUSCULAR | Status: DC
Start: ? — End: 2017-11-20

## 2017-11-20 MED ORDER — DIPHENHYDRAMINE HCL 25 MG PO CAPS
25.00 | ORAL_CAPSULE | ORAL | Status: DC
Start: ? — End: 2017-11-20

## 2017-11-22 DIAGNOSIS — B379 Candidiasis, unspecified: Secondary | ICD-10-CM | POA: Diagnosis not present

## 2017-11-22 DIAGNOSIS — Z955 Presence of coronary angioplasty implant and graft: Secondary | ICD-10-CM | POA: Diagnosis not present

## 2017-11-22 DIAGNOSIS — M79606 Pain in leg, unspecified: Secondary | ICD-10-CM | POA: Diagnosis not present

## 2017-11-22 DIAGNOSIS — R093 Abnormal sputum: Secondary | ICD-10-CM | POA: Diagnosis not present

## 2017-11-22 DIAGNOSIS — R29818 Other symptoms and signs involving the nervous system: Secondary | ICD-10-CM | POA: Diagnosis not present

## 2017-11-22 DIAGNOSIS — J398 Other specified diseases of upper respiratory tract: Secondary | ICD-10-CM | POA: Diagnosis not present

## 2017-11-22 DIAGNOSIS — S32592A Other specified fracture of left pubis, initial encounter for closed fracture: Secondary | ICD-10-CM | POA: Diagnosis not present

## 2017-11-22 DIAGNOSIS — Z743 Need for continuous supervision: Secondary | ICD-10-CM | POA: Diagnosis not present

## 2017-11-22 DIAGNOSIS — I358 Other nonrheumatic aortic valve disorders: Secondary | ICD-10-CM | POA: Diagnosis not present

## 2017-11-22 DIAGNOSIS — Z452 Encounter for adjustment and management of vascular access device: Secondary | ICD-10-CM | POA: Diagnosis not present

## 2017-11-22 DIAGNOSIS — R9431 Abnormal electrocardiogram [ECG] [EKG]: Secondary | ICD-10-CM | POA: Diagnosis not present

## 2017-11-22 DIAGNOSIS — Z9889 Other specified postprocedural states: Secondary | ICD-10-CM | POA: Diagnosis not present

## 2017-11-22 DIAGNOSIS — Z853 Personal history of malignant neoplasm of breast: Secondary | ICD-10-CM | POA: Diagnosis not present

## 2017-11-22 DIAGNOSIS — S32512A Fracture of superior rim of left pubis, initial encounter for closed fracture: Secondary | ICD-10-CM | POA: Diagnosis not present

## 2017-11-22 DIAGNOSIS — Z9911 Dependence on respirator [ventilator] status: Secondary | ICD-10-CM | POA: Diagnosis not present

## 2017-11-22 DIAGNOSIS — E785 Hyperlipidemia, unspecified: Secondary | ICD-10-CM | POA: Diagnosis not present

## 2017-11-22 DIAGNOSIS — Y998 Other external cause status: Secondary | ICD-10-CM | POA: Diagnosis not present

## 2017-11-22 DIAGNOSIS — B3789 Other sites of candidiasis: Secondary | ICD-10-CM | POA: Diagnosis present

## 2017-11-22 DIAGNOSIS — R55 Syncope and collapse: Secondary | ICD-10-CM | POA: Diagnosis not present

## 2017-11-22 DIAGNOSIS — E860 Dehydration: Secondary | ICD-10-CM | POA: Diagnosis present

## 2017-11-22 DIAGNOSIS — R509 Fever, unspecified: Secondary | ICD-10-CM | POA: Diagnosis not present

## 2017-11-22 DIAGNOSIS — Y92009 Unspecified place in unspecified non-institutional (private) residence as the place of occurrence of the external cause: Secondary | ICD-10-CM | POA: Diagnosis not present

## 2017-11-22 DIAGNOSIS — S2249XA Multiple fractures of ribs, unspecified side, initial encounter for closed fracture: Secondary | ICD-10-CM | POA: Diagnosis not present

## 2017-11-22 DIAGNOSIS — E872 Acidosis: Secondary | ICD-10-CM | POA: Diagnosis not present

## 2017-11-22 DIAGNOSIS — R498 Other voice and resonance disorders: Secondary | ICD-10-CM | POA: Diagnosis not present

## 2017-11-22 DIAGNOSIS — I340 Nonrheumatic mitral (valve) insufficiency: Secondary | ICD-10-CM | POA: Diagnosis not present

## 2017-11-22 DIAGNOSIS — G8929 Other chronic pain: Secondary | ICD-10-CM | POA: Diagnosis not present

## 2017-11-22 DIAGNOSIS — I2510 Atherosclerotic heart disease of native coronary artery without angina pectoris: Secondary | ICD-10-CM | POA: Diagnosis present

## 2017-11-22 DIAGNOSIS — J9819 Other pulmonary collapse: Secondary | ICD-10-CM | POA: Diagnosis not present

## 2017-11-22 DIAGNOSIS — Z79891 Long term (current) use of opiate analgesic: Secondary | ICD-10-CM | POA: Diagnosis not present

## 2017-11-22 DIAGNOSIS — Z4789 Encounter for other orthopedic aftercare: Secondary | ICD-10-CM | POA: Diagnosis not present

## 2017-11-22 DIAGNOSIS — L89891 Pressure ulcer of other site, stage 1: Secondary | ICD-10-CM | POA: Diagnosis not present

## 2017-11-22 DIAGNOSIS — S329XXA Fracture of unspecified parts of lumbosacral spine and pelvis, initial encounter for closed fracture: Secondary | ICD-10-CM | POA: Diagnosis not present

## 2017-11-22 DIAGNOSIS — K228 Other specified diseases of esophagus: Secondary | ICD-10-CM | POA: Diagnosis present

## 2017-11-22 DIAGNOSIS — M80052A Age-related osteoporosis with current pathological fracture, left femur, initial encounter for fracture: Secondary | ICD-10-CM | POA: Diagnosis not present

## 2017-11-22 DIAGNOSIS — Z9689 Presence of other specified functional implants: Secondary | ICD-10-CM | POA: Diagnosis not present

## 2017-11-22 DIAGNOSIS — R1312 Dysphagia, oropharyngeal phase: Secondary | ICD-10-CM | POA: Diagnosis not present

## 2017-11-22 DIAGNOSIS — R262 Difficulty in walking, not elsewhere classified: Secondary | ICD-10-CM | POA: Diagnosis not present

## 2017-11-22 DIAGNOSIS — R030 Elevated blood-pressure reading, without diagnosis of hypertension: Secondary | ICD-10-CM | POA: Diagnosis not present

## 2017-11-22 DIAGNOSIS — S32810A Multiple fractures of pelvis with stable disruption of pelvic ring, initial encounter for closed fracture: Secondary | ICD-10-CM | POA: Diagnosis not present

## 2017-11-22 DIAGNOSIS — R1314 Dysphagia, pharyngoesophageal phase: Secondary | ICD-10-CM | POA: Diagnosis present

## 2017-11-22 DIAGNOSIS — G894 Chronic pain syndrome: Secondary | ICD-10-CM | POA: Diagnosis present

## 2017-11-22 DIAGNOSIS — M19012 Primary osteoarthritis, left shoulder: Secondary | ICD-10-CM | POA: Diagnosis not present

## 2017-11-22 DIAGNOSIS — Z9011 Acquired absence of right breast and nipple: Secondary | ICD-10-CM | POA: Diagnosis not present

## 2017-11-22 DIAGNOSIS — S32502D Unspecified fracture of left pubis, subsequent encounter for fracture with routine healing: Secondary | ICD-10-CM | POA: Diagnosis not present

## 2017-11-22 DIAGNOSIS — J982 Interstitial emphysema: Secondary | ICD-10-CM | POA: Diagnosis not present

## 2017-11-22 DIAGNOSIS — J9851 Mediastinitis: Secondary | ICD-10-CM | POA: Diagnosis present

## 2017-11-22 DIAGNOSIS — J853 Abscess of mediastinum: Secondary | ICD-10-CM | POA: Diagnosis present

## 2017-11-22 DIAGNOSIS — G8918 Other acute postprocedural pain: Secondary | ICD-10-CM | POA: Diagnosis not present

## 2017-11-22 DIAGNOSIS — Z1612 Extended spectrum beta lactamase (ESBL) resistance: Secondary | ICD-10-CM | POA: Diagnosis not present

## 2017-11-22 DIAGNOSIS — S2242XA Multiple fractures of ribs, left side, initial encounter for closed fracture: Secondary | ICD-10-CM | POA: Diagnosis not present

## 2017-11-22 DIAGNOSIS — J9602 Acute respiratory failure with hypercapnia: Secondary | ICD-10-CM | POA: Diagnosis not present

## 2017-11-22 DIAGNOSIS — I10 Essential (primary) hypertension: Secondary | ICD-10-CM | POA: Diagnosis present

## 2017-11-22 DIAGNOSIS — Y9301 Activity, walking, marching and hiking: Secondary | ICD-10-CM | POA: Diagnosis not present

## 2017-11-22 DIAGNOSIS — R918 Other nonspecific abnormal finding of lung field: Secondary | ICD-10-CM | POA: Diagnosis not present

## 2017-11-22 DIAGNOSIS — E119 Type 2 diabetes mellitus without complications: Secondary | ICD-10-CM | POA: Diagnosis present

## 2017-11-22 DIAGNOSIS — J860 Pyothorax with fistula: Secondary | ICD-10-CM | POA: Diagnosis not present

## 2017-11-22 DIAGNOSIS — I4891 Unspecified atrial fibrillation: Secondary | ICD-10-CM | POA: Diagnosis present

## 2017-11-22 DIAGNOSIS — Z87891 Personal history of nicotine dependence: Secondary | ICD-10-CM | POA: Diagnosis not present

## 2017-11-22 DIAGNOSIS — M80059A Age-related osteoporosis with current pathological fracture, unspecified femur, initial encounter for fracture: Secondary | ICD-10-CM | POA: Diagnosis not present

## 2017-11-22 DIAGNOSIS — J449 Chronic obstructive pulmonary disease, unspecified: Secondary | ICD-10-CM | POA: Diagnosis present

## 2017-11-22 DIAGNOSIS — Z430 Encounter for attention to tracheostomy: Secondary | ICD-10-CM | POA: Diagnosis not present

## 2017-11-22 DIAGNOSIS — T17990A Other foreign object in respiratory tract, part unspecified in causing asphyxiation, initial encounter: Secondary | ICD-10-CM | POA: Diagnosis not present

## 2017-11-22 DIAGNOSIS — K9423 Gastrostomy malfunction: Secondary | ICD-10-CM | POA: Diagnosis not present

## 2017-11-22 DIAGNOSIS — M84454A Pathological fracture, pelvis, initial encounter for fracture: Secondary | ICD-10-CM | POA: Diagnosis present

## 2017-11-22 DIAGNOSIS — Z9181 History of falling: Secondary | ICD-10-CM | POA: Diagnosis not present

## 2017-11-22 DIAGNOSIS — S32502A Unspecified fracture of left pubis, initial encounter for closed fracture: Secondary | ICD-10-CM | POA: Diagnosis not present

## 2017-11-22 DIAGNOSIS — B9620 Unspecified Escherichia coli [E. coli] as the cause of diseases classified elsewhere: Secondary | ICD-10-CM | POA: Diagnosis not present

## 2017-11-22 DIAGNOSIS — M6281 Muscle weakness (generalized): Secondary | ICD-10-CM | POA: Diagnosis not present

## 2017-11-22 DIAGNOSIS — I371 Nonrheumatic pulmonary valve insufficiency: Secondary | ICD-10-CM | POA: Diagnosis not present

## 2017-11-22 DIAGNOSIS — Z959 Presence of cardiac and vascular implant and graft, unspecified: Secondary | ICD-10-CM | POA: Diagnosis not present

## 2017-11-22 DIAGNOSIS — S2241XA Multiple fractures of ribs, right side, initial encounter for closed fracture: Secondary | ICD-10-CM | POA: Diagnosis not present

## 2017-11-22 DIAGNOSIS — R1310 Dysphagia, unspecified: Secondary | ICD-10-CM | POA: Diagnosis not present

## 2017-11-22 DIAGNOSIS — W19XXXA Unspecified fall, initial encounter: Secondary | ICD-10-CM | POA: Diagnosis not present

## 2017-11-22 DIAGNOSIS — J939 Pneumothorax, unspecified: Secondary | ICD-10-CM | POA: Diagnosis not present

## 2017-11-22 DIAGNOSIS — S270XXA Traumatic pneumothorax, initial encounter: Secondary | ICD-10-CM | POA: Diagnosis present

## 2017-11-22 DIAGNOSIS — E440 Moderate protein-calorie malnutrition: Secondary | ICD-10-CM | POA: Diagnosis present

## 2017-11-22 DIAGNOSIS — B9689 Other specified bacterial agents as the cause of diseases classified elsewhere: Secondary | ICD-10-CM | POA: Diagnosis not present

## 2017-11-22 DIAGNOSIS — J390 Retropharyngeal and parapharyngeal abscess: Secondary | ICD-10-CM | POA: Diagnosis not present

## 2017-11-22 DIAGNOSIS — S2242XS Multiple fractures of ribs, left side, sequela: Secondary | ICD-10-CM | POA: Diagnosis not present

## 2017-11-22 DIAGNOSIS — S3289XA Fracture of other parts of pelvis, initial encounter for closed fracture: Secondary | ICD-10-CM | POA: Diagnosis not present

## 2017-11-22 DIAGNOSIS — Z931 Gastrostomy status: Secondary | ICD-10-CM | POA: Diagnosis not present

## 2017-11-22 DIAGNOSIS — L89151 Pressure ulcer of sacral region, stage 1: Secondary | ICD-10-CM | POA: Diagnosis present

## 2017-11-22 DIAGNOSIS — Z902 Acquired absence of lung [part of]: Secondary | ICD-10-CM | POA: Diagnosis not present

## 2017-11-22 DIAGNOSIS — R296 Repeated falls: Secondary | ICD-10-CM | POA: Diagnosis not present

## 2017-11-22 DIAGNOSIS — J9601 Acute respiratory failure with hypoxia: Secondary | ICD-10-CM | POA: Diagnosis not present

## 2017-11-22 DIAGNOSIS — J392 Other diseases of pharynx: Secondary | ICD-10-CM | POA: Diagnosis present

## 2017-11-22 DIAGNOSIS — G959 Disease of spinal cord, unspecified: Secondary | ICD-10-CM | POA: Diagnosis not present

## 2017-11-22 DIAGNOSIS — W01198A Fall on same level from slipping, tripping and stumbling with subsequent striking against other object, initial encounter: Secondary | ICD-10-CM | POA: Diagnosis not present

## 2017-11-22 DIAGNOSIS — K223 Perforation of esophagus: Secondary | ICD-10-CM | POA: Diagnosis not present

## 2017-11-22 DIAGNOSIS — J852 Abscess of lung without pneumonia: Secondary | ICD-10-CM | POA: Diagnosis not present

## 2017-11-22 DIAGNOSIS — R680 Hypothermia, not associated with low environmental temperature: Secondary | ICD-10-CM | POA: Diagnosis not present

## 2017-11-22 DIAGNOSIS — K225 Diverticulum of esophagus, acquired: Secondary | ICD-10-CM | POA: Diagnosis not present

## 2017-11-22 DIAGNOSIS — J90 Pleural effusion, not elsewhere classified: Secondary | ICD-10-CM | POA: Diagnosis present

## 2017-11-22 DIAGNOSIS — R279 Unspecified lack of coordination: Secondary | ICD-10-CM | POA: Diagnosis not present

## 2017-11-22 DIAGNOSIS — I2699 Other pulmonary embolism without acute cor pulmonale: Secondary | ICD-10-CM | POA: Diagnosis not present

## 2017-11-22 DIAGNOSIS — R1319 Other dysphagia: Secondary | ICD-10-CM | POA: Diagnosis not present

## 2017-11-29 DIAGNOSIS — S2242XA Multiple fractures of ribs, left side, initial encounter for closed fracture: Secondary | ICD-10-CM | POA: Insufficient documentation

## 2017-11-29 DIAGNOSIS — Z853 Personal history of malignant neoplasm of breast: Secondary | ICD-10-CM | POA: Insufficient documentation

## 2017-11-29 DIAGNOSIS — L89151 Pressure ulcer of sacral region, stage 1: Secondary | ICD-10-CM | POA: Insufficient documentation

## 2017-12-07 DIAGNOSIS — R1310 Dysphagia, unspecified: Secondary | ICD-10-CM | POA: Diagnosis not present

## 2017-12-07 DIAGNOSIS — R197 Diarrhea, unspecified: Secondary | ICD-10-CM | POA: Diagnosis not present

## 2017-12-07 DIAGNOSIS — Z931 Gastrostomy status: Secondary | ICD-10-CM | POA: Diagnosis not present

## 2017-12-07 DIAGNOSIS — R1319 Other dysphagia: Secondary | ICD-10-CM | POA: Diagnosis not present

## 2017-12-07 DIAGNOSIS — I2510 Atherosclerotic heart disease of native coronary artery without angina pectoris: Secondary | ICD-10-CM | POA: Diagnosis not present

## 2017-12-07 DIAGNOSIS — S2242XA Multiple fractures of ribs, left side, initial encounter for closed fracture: Secondary | ICD-10-CM | POA: Diagnosis not present

## 2017-12-07 DIAGNOSIS — Z9181 History of falling: Secondary | ICD-10-CM | POA: Diagnosis not present

## 2017-12-07 DIAGNOSIS — E119 Type 2 diabetes mellitus without complications: Secondary | ICD-10-CM | POA: Diagnosis not present

## 2017-12-07 DIAGNOSIS — I10 Essential (primary) hypertension: Secondary | ICD-10-CM | POA: Diagnosis not present

## 2017-12-07 DIAGNOSIS — M6281 Muscle weakness (generalized): Secondary | ICD-10-CM | POA: Diagnosis not present

## 2017-12-07 DIAGNOSIS — Z743 Need for continuous supervision: Secondary | ICD-10-CM | POA: Diagnosis not present

## 2017-12-07 DIAGNOSIS — S2242XS Multiple fractures of ribs, left side, sequela: Secondary | ICD-10-CM | POA: Diagnosis not present

## 2017-12-07 DIAGNOSIS — Z960 Presence of urogenital implants: Secondary | ICD-10-CM | POA: Diagnosis not present

## 2017-12-07 DIAGNOSIS — R1314 Dysphagia, pharyngoesophageal phase: Secondary | ICD-10-CM | POA: Diagnosis not present

## 2017-12-07 DIAGNOSIS — R102 Pelvic and perineal pain: Secondary | ICD-10-CM | POA: Diagnosis not present

## 2017-12-07 DIAGNOSIS — R262 Difficulty in walking, not elsewhere classified: Secondary | ICD-10-CM | POA: Diagnosis not present

## 2017-12-07 DIAGNOSIS — R279 Unspecified lack of coordination: Secondary | ICD-10-CM | POA: Diagnosis not present

## 2017-12-07 DIAGNOSIS — S31000A Unspecified open wound of lower back and pelvis without penetration into retroperitoneum, initial encounter: Secondary | ICD-10-CM | POA: Diagnosis not present

## 2017-12-07 DIAGNOSIS — Z853 Personal history of malignant neoplasm of breast: Secondary | ICD-10-CM | POA: Diagnosis not present

## 2017-12-07 DIAGNOSIS — R498 Other voice and resonance disorders: Secondary | ICD-10-CM | POA: Diagnosis not present

## 2017-12-07 DIAGNOSIS — G8929 Other chronic pain: Secondary | ICD-10-CM | POA: Diagnosis not present

## 2017-12-07 DIAGNOSIS — M80059A Age-related osteoporosis with current pathological fracture, unspecified femur, initial encounter for fracture: Secondary | ICD-10-CM | POA: Diagnosis not present

## 2017-12-07 DIAGNOSIS — L0291 Cutaneous abscess, unspecified: Secondary | ICD-10-CM | POA: Diagnosis not present

## 2017-12-07 DIAGNOSIS — Z4789 Encounter for other orthopedic aftercare: Secondary | ICD-10-CM | POA: Diagnosis not present

## 2017-12-07 DIAGNOSIS — E440 Moderate protein-calorie malnutrition: Secondary | ICD-10-CM | POA: Diagnosis not present

## 2017-12-07 DIAGNOSIS — R55 Syncope and collapse: Secondary | ICD-10-CM | POA: Diagnosis not present

## 2017-12-07 DIAGNOSIS — S32502D Unspecified fracture of left pubis, subsequent encounter for fracture with routine healing: Secondary | ICD-10-CM | POA: Diagnosis not present

## 2017-12-07 DIAGNOSIS — K228 Other specified diseases of esophagus: Secondary | ICD-10-CM | POA: Diagnosis not present

## 2017-12-07 DIAGNOSIS — L89151 Pressure ulcer of sacral region, stage 1: Secondary | ICD-10-CM | POA: Diagnosis not present

## 2017-12-07 DIAGNOSIS — R1312 Dysphagia, oropharyngeal phase: Secondary | ICD-10-CM | POA: Diagnosis not present

## 2017-12-07 DIAGNOSIS — R079 Chest pain, unspecified: Secondary | ICD-10-CM | POA: Diagnosis not present

## 2017-12-07 DIAGNOSIS — J853 Abscess of mediastinum: Secondary | ICD-10-CM | POA: Diagnosis not present

## 2017-12-07 DIAGNOSIS — R895 Abnormal microbiological findings in specimens from other organs, systems and tissues: Secondary | ICD-10-CM | POA: Diagnosis not present

## 2017-12-07 DIAGNOSIS — S329XXA Fracture of unspecified parts of lumbosacral spine and pelvis, initial encounter for closed fracture: Secondary | ICD-10-CM | POA: Diagnosis not present

## 2017-12-07 DIAGNOSIS — R2689 Other abnormalities of gait and mobility: Secondary | ICD-10-CM | POA: Diagnosis not present

## 2017-12-07 DIAGNOSIS — G894 Chronic pain syndrome: Secondary | ICD-10-CM | POA: Diagnosis not present

## 2017-12-07 MED ORDER — GENERIC EXTERNAL MEDICATION
1.00 g | Status: DC
Start: 2017-12-08 — End: 2017-12-07

## 2017-12-07 MED ORDER — NALOXONE HCL 0.4 MG/ML IJ SOLN
0.40 | INTRAMUSCULAR | Status: DC
Start: ? — End: 2017-12-07

## 2017-12-07 MED ORDER — PROCHLORPERAZINE EDISYLATE 5 MG/ML IJ SOLN
5.00 | INTRAMUSCULAR | Status: DC
Start: ? — End: 2017-12-07

## 2017-12-07 MED ORDER — POLYETHYLENE GLYCOL 3350 17 G PO PACK
17.00 g | PACK | ORAL | Status: DC
Start: 2017-12-08 — End: 2017-12-07

## 2017-12-07 MED ORDER — DOCUSATE SODIUM 100 MG PO CAPS
100.00 | ORAL_CAPSULE | ORAL | Status: DC
Start: ? — End: 2017-12-07

## 2017-12-07 MED ORDER — GABAPENTIN 600 MG PO TABS
600.00 | ORAL_TABLET | ORAL | Status: DC
Start: 2017-12-07 — End: 2017-12-07

## 2017-12-07 MED ORDER — GENERIC EXTERNAL MEDICATION
2.00 | Status: DC
Start: ? — End: 2017-12-07

## 2017-12-07 MED ORDER — VENLAFAXINE HCL 25 MG PO TABS
50.00 | ORAL_TABLET | ORAL | Status: DC
Start: 2017-12-07 — End: 2017-12-07

## 2017-12-07 MED ORDER — OXYCODONE HCL 5 MG/5ML PO SOLN
10.00 | ORAL | Status: DC
Start: ? — End: 2017-12-07

## 2017-12-07 MED ORDER — DEXTROSE 10 % IV SOLN
125.00 | INTRAVENOUS | Status: DC
Start: ? — End: 2017-12-07

## 2017-12-07 MED ORDER — ALBUTEROL SULFATE (2.5 MG/3ML) 0.083% IN NEBU
2.50 | INHALATION_SOLUTION | RESPIRATORY_TRACT | Status: DC
Start: ? — End: 2017-12-07

## 2017-12-07 MED ORDER — GENERIC EXTERNAL MEDICATION
850.00 | Status: DC
Start: 2017-12-07 — End: 2017-12-07

## 2017-12-07 MED ORDER — INSULIN LISPRO 100 UNIT/ML ~~LOC~~ SOLN
1.00 | SUBCUTANEOUS | Status: DC
Start: 2017-12-07 — End: 2017-12-07

## 2017-12-07 MED ORDER — NYSTATIN 100000 UNIT/GM EX POWD
CUTANEOUS | Status: DC
Start: 2017-12-07 — End: 2017-12-07

## 2017-12-07 MED ORDER — LOPERAMIDE HCL 1 MG/5ML PO LIQD
2.00 | ORAL | Status: DC
Start: ? — End: 2017-12-07

## 2017-12-07 MED ORDER — FIBER (GUAR GUM) PO
1.00 | ORAL | Status: DC
Start: 2017-12-07 — End: 2017-12-07

## 2017-12-07 MED ORDER — FLUCONAZOLE 200 MG PO TABS
600.00 | ORAL_TABLET | ORAL | Status: DC
Start: 2017-12-08 — End: 2017-12-07

## 2017-12-07 MED ORDER — FUROSEMIDE 10 MG/ML PO SOLN
20.00 | ORAL | Status: DC
Start: 2017-12-08 — End: 2017-12-07

## 2017-12-07 MED ORDER — MORPHINE SULFATE 10 MG/5ML PO SOLN
20.00 | ORAL | Status: DC
Start: 2017-12-07 — End: 2017-12-07

## 2017-12-07 MED ORDER — GENERIC EXTERNAL MEDICATION
1.00 | Status: DC
Start: 2017-12-08 — End: 2017-12-07

## 2017-12-08 DIAGNOSIS — K228 Other specified diseases of esophagus: Secondary | ICD-10-CM | POA: Diagnosis not present

## 2017-12-08 DIAGNOSIS — S329XXA Fracture of unspecified parts of lumbosacral spine and pelvis, initial encounter for closed fracture: Secondary | ICD-10-CM | POA: Diagnosis not present

## 2017-12-08 DIAGNOSIS — R55 Syncope and collapse: Secondary | ICD-10-CM | POA: Diagnosis not present

## 2017-12-08 DIAGNOSIS — R1310 Dysphagia, unspecified: Secondary | ICD-10-CM | POA: Diagnosis not present

## 2017-12-09 DIAGNOSIS — G894 Chronic pain syndrome: Secondary | ICD-10-CM | POA: Diagnosis not present

## 2017-12-09 DIAGNOSIS — R2689 Other abnormalities of gait and mobility: Secondary | ICD-10-CM | POA: Diagnosis not present

## 2017-12-14 DIAGNOSIS — G894 Chronic pain syndrome: Secondary | ICD-10-CM | POA: Diagnosis not present

## 2017-12-14 DIAGNOSIS — R2689 Other abnormalities of gait and mobility: Secondary | ICD-10-CM | POA: Diagnosis not present

## 2017-12-14 DIAGNOSIS — G8929 Other chronic pain: Secondary | ICD-10-CM | POA: Diagnosis not present

## 2017-12-14 DIAGNOSIS — Z960 Presence of urogenital implants: Secondary | ICD-10-CM | POA: Diagnosis not present

## 2017-12-14 DIAGNOSIS — Z931 Gastrostomy status: Secondary | ICD-10-CM | POA: Diagnosis not present

## 2017-12-14 DIAGNOSIS — R197 Diarrhea, unspecified: Secondary | ICD-10-CM | POA: Diagnosis not present

## 2017-12-15 DIAGNOSIS — L0291 Cutaneous abscess, unspecified: Secondary | ICD-10-CM | POA: Diagnosis not present

## 2017-12-16 DIAGNOSIS — G894 Chronic pain syndrome: Secondary | ICD-10-CM | POA: Diagnosis not present

## 2017-12-16 DIAGNOSIS — R2689 Other abnormalities of gait and mobility: Secondary | ICD-10-CM | POA: Diagnosis not present

## 2017-12-16 DIAGNOSIS — R102 Pelvic and perineal pain: Secondary | ICD-10-CM | POA: Diagnosis not present

## 2017-12-16 DIAGNOSIS — R079 Chest pain, unspecified: Secondary | ICD-10-CM | POA: Diagnosis not present

## 2017-12-18 DIAGNOSIS — R079 Chest pain, unspecified: Secondary | ICD-10-CM | POA: Diagnosis not present

## 2017-12-18 DIAGNOSIS — R2689 Other abnormalities of gait and mobility: Secondary | ICD-10-CM | POA: Diagnosis not present

## 2017-12-18 DIAGNOSIS — G894 Chronic pain syndrome: Secondary | ICD-10-CM | POA: Diagnosis not present

## 2017-12-18 DIAGNOSIS — R102 Pelvic and perineal pain: Secondary | ICD-10-CM | POA: Diagnosis not present

## 2017-12-21 DIAGNOSIS — R895 Abnormal microbiological findings in specimens from other organs, systems and tissues: Secondary | ICD-10-CM | POA: Diagnosis not present

## 2017-12-21 DIAGNOSIS — G8929 Other chronic pain: Secondary | ICD-10-CM | POA: Diagnosis not present

## 2017-12-21 DIAGNOSIS — R197 Diarrhea, unspecified: Secondary | ICD-10-CM | POA: Diagnosis not present

## 2017-12-21 DIAGNOSIS — S31000A Unspecified open wound of lower back and pelvis without penetration into retroperitoneum, initial encounter: Secondary | ICD-10-CM | POA: Diagnosis not present

## 2017-12-22 DIAGNOSIS — G894 Chronic pain syndrome: Secondary | ICD-10-CM | POA: Diagnosis not present

## 2017-12-22 DIAGNOSIS — R2689 Other abnormalities of gait and mobility: Secondary | ICD-10-CM | POA: Diagnosis not present

## 2017-12-22 DIAGNOSIS — R102 Pelvic and perineal pain: Secondary | ICD-10-CM | POA: Diagnosis not present

## 2017-12-22 DIAGNOSIS — R079 Chest pain, unspecified: Secondary | ICD-10-CM | POA: Diagnosis not present

## 2017-12-24 DIAGNOSIS — R2689 Other abnormalities of gait and mobility: Secondary | ICD-10-CM | POA: Diagnosis not present

## 2017-12-24 DIAGNOSIS — Z931 Gastrostomy status: Secondary | ICD-10-CM | POA: Diagnosis not present

## 2017-12-24 DIAGNOSIS — G894 Chronic pain syndrome: Secondary | ICD-10-CM | POA: Diagnosis not present

## 2017-12-24 DIAGNOSIS — J853 Abscess of mediastinum: Secondary | ICD-10-CM | POA: Diagnosis not present

## 2017-12-24 DIAGNOSIS — K228 Other specified diseases of esophagus: Secondary | ICD-10-CM | POA: Diagnosis not present

## 2017-12-24 DIAGNOSIS — R102 Pelvic and perineal pain: Secondary | ICD-10-CM | POA: Diagnosis not present

## 2017-12-24 DIAGNOSIS — S329XXA Fracture of unspecified parts of lumbosacral spine and pelvis, initial encounter for closed fracture: Secondary | ICD-10-CM | POA: Diagnosis not present

## 2017-12-24 DIAGNOSIS — R079 Chest pain, unspecified: Secondary | ICD-10-CM | POA: Diagnosis not present

## 2017-12-25 ENCOUNTER — Telehealth: Payer: Self-pay

## 2017-12-25 NOTE — Telephone Encounter (Signed)
Received a phone call from Anice Paganini at [facility] wanting to schedule a follow up appointment with Dr. Baxter Flattery because of E.coli infection. Patient started on Vancomycin IV for 5 days and is supposed to be discharged 12/26/2017 per patient request and can't go home on that. Ampicillin hard to get and Anice Paganini, PA would like a call from Dr. Baxter Flattery on advice on how to proceed. Pola Corn, LPN

## 2017-12-25 NOTE — Telephone Encounter (Signed)
Hi nate. I hope you didn't feel that I was dumping this on you. Technically the SNF should be calling [facility] since they are the ones who originated the IV abtx treatment plan. I am happy to see her in follow up but I can't tell by their paperwork in epic who prescribed the iv vanco. Sorry that I can't help.

## 2017-12-27 DIAGNOSIS — I4891 Unspecified atrial fibrillation: Secondary | ICD-10-CM | POA: Diagnosis not present

## 2017-12-27 DIAGNOSIS — Z9181 History of falling: Secondary | ICD-10-CM | POA: Diagnosis not present

## 2017-12-27 DIAGNOSIS — S21201D Unspecified open wound of right back wall of thorax without penetration into thoracic cavity, subsequent encounter: Secondary | ICD-10-CM | POA: Diagnosis not present

## 2017-12-27 DIAGNOSIS — J853 Abscess of mediastinum: Secondary | ICD-10-CM | POA: Diagnosis not present

## 2017-12-27 DIAGNOSIS — I2510 Atherosclerotic heart disease of native coronary artery without angina pectoris: Secondary | ICD-10-CM | POA: Diagnosis not present

## 2017-12-27 DIAGNOSIS — S2242XD Multiple fractures of ribs, left side, subsequent encounter for fracture with routine healing: Secondary | ICD-10-CM | POA: Diagnosis not present

## 2017-12-27 DIAGNOSIS — Z853 Personal history of malignant neoplasm of breast: Secondary | ICD-10-CM | POA: Diagnosis not present

## 2017-12-27 DIAGNOSIS — I10 Essential (primary) hypertension: Secondary | ICD-10-CM | POA: Diagnosis not present

## 2017-12-27 DIAGNOSIS — M84454D Pathological fracture, pelvis, subsequent encounter for fracture with routine healing: Secondary | ICD-10-CM | POA: Diagnosis not present

## 2017-12-27 DIAGNOSIS — Z931 Gastrostomy status: Secondary | ICD-10-CM | POA: Diagnosis not present

## 2017-12-27 DIAGNOSIS — E440 Moderate protein-calorie malnutrition: Secondary | ICD-10-CM | POA: Diagnosis not present

## 2017-12-27 DIAGNOSIS — Z7951 Long term (current) use of inhaled steroids: Secondary | ICD-10-CM | POA: Diagnosis not present

## 2017-12-27 DIAGNOSIS — E119 Type 2 diabetes mellitus without complications: Secondary | ICD-10-CM | POA: Diagnosis not present

## 2017-12-27 DIAGNOSIS — G894 Chronic pain syndrome: Secondary | ICD-10-CM | POA: Diagnosis not present

## 2017-12-27 DIAGNOSIS — R1314 Dysphagia, pharyngoesophageal phase: Secondary | ICD-10-CM | POA: Diagnosis not present

## 2017-12-27 DIAGNOSIS — L89150 Pressure ulcer of sacral region, unstageable: Secondary | ICD-10-CM | POA: Diagnosis not present

## 2017-12-29 ENCOUNTER — Telehealth: Payer: Self-pay | Admitting: Behavioral Health

## 2017-12-29 ENCOUNTER — Encounter: Payer: Self-pay | Admitting: Internal Medicine

## 2017-12-29 ENCOUNTER — Ambulatory Visit (INDEPENDENT_AMBULATORY_CARE_PROVIDER_SITE_OTHER): Payer: Medicare Other | Admitting: Internal Medicine

## 2017-12-29 VITALS — BP 113/76 | HR 98 | Temp 98.5°F | Ht 62.0 in | Wt 120.0 lb

## 2017-12-29 DIAGNOSIS — E43 Unspecified severe protein-calorie malnutrition: Secondary | ICD-10-CM

## 2017-12-29 DIAGNOSIS — K223 Perforation of esophagus: Secondary | ICD-10-CM

## 2017-12-29 DIAGNOSIS — E119 Type 2 diabetes mellitus without complications: Secondary | ICD-10-CM | POA: Diagnosis not present

## 2017-12-29 DIAGNOSIS — L89150 Pressure ulcer of sacral region, unstageable: Secondary | ICD-10-CM | POA: Diagnosis not present

## 2017-12-29 DIAGNOSIS — M4643 Discitis, unspecified, cervicothoracic region: Secondary | ICD-10-CM

## 2017-12-29 DIAGNOSIS — M84454D Pathological fracture, pelvis, subsequent encounter for fracture with routine healing: Secondary | ICD-10-CM | POA: Diagnosis not present

## 2017-12-29 DIAGNOSIS — I2510 Atherosclerotic heart disease of native coronary artery without angina pectoris: Secondary | ICD-10-CM | POA: Diagnosis not present

## 2017-12-29 DIAGNOSIS — J853 Abscess of mediastinum: Secondary | ICD-10-CM | POA: Diagnosis not present

## 2017-12-29 DIAGNOSIS — S2242XD Multiple fractures of ribs, left side, subsequent encounter for fracture with routine healing: Secondary | ICD-10-CM | POA: Diagnosis not present
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
## 2017-12-29 NOTE — Telephone Encounter (Signed)
Called [facility] enteral department.  Spoke to [place], gave verbal order per Dr. Baxter Flattery to increase patients 2 Cal Nutrition formula to 3 times a day with 90 cans a month.  Roselyn Reef verbalized understanding with read-back. Pricilla Riffle RN

## 2017-12-30 LAB — COMPLETE METABOLIC PANEL WITH GFR
AG RATIO: 1.2 (calc) (ref 1.0–2.5)
ALBUMIN MSPROF: 3.8 g/dL (ref 3.6–5.1)
ALT: 12 U/L (ref 6–29)
AST: 16 U/L (ref 10–35)
Alkaline phosphatase (APISO): 229 U/L — ABNORMAL HIGH (ref 33–130)
BUN: 25 mg/dL (ref 7–25)
CO2: 25 mmol/L (ref 20–32)
Calcium: 9 mg/dL (ref 8.6–10.4)
Chloride: 96 mmol/L — ABNORMAL LOW (ref 98–110)
Creat: 0.58 mg/dL (ref 0.50–0.99)
GFR, EST AFRICAN AMERICAN: 109 mL/min/{1.73_m2} (ref >=60)
GFR, Est Non African American: 94 mL/min/{1.73_m2} (ref >=60)
GLOBULIN: 3.3 g/dL (ref 1.9–3.7)
Glucose, Bld: 118 mg/dL — ABNORMAL HIGH (ref 65–99)
POTASSIUM: 4.7 mmol/L (ref 3.5–5.3)
SODIUM: 133 mmol/L — AB (ref 135–146)
TOTAL PROTEIN: 7.1 g/dL (ref 6.1–8.1)
Total Bilirubin: 0.3 mg/dL (ref 0.2–1.2)

## 2017-12-30 LAB — SEDIMENTATION RATE: SED RATE: 70 mm/h — AB (ref 0–30)

## 2017-12-30 LAB — SPECIMEN COMPROMISED

## 2017-12-30 LAB — C-REACTIVE PROTEIN: CRP: 73.5 mg/L — ABNORMAL HIGH (ref <8.0)

## 2017-12-31 DIAGNOSIS — S2242XD Multiple fractures of ribs, left side, subsequent encounter for fracture with routine healing: Secondary | ICD-10-CM | POA: Diagnosis not present

## 2017-12-31 DIAGNOSIS — E119 Type 2 diabetes mellitus without complications: Secondary | ICD-10-CM | POA: Diagnosis not present

## 2017-12-31 DIAGNOSIS — J853 Abscess of mediastinum: Secondary | ICD-10-CM | POA: Diagnosis not present

## 2017-12-31 DIAGNOSIS — M84454D Pathological fracture, pelvis, subsequent encounter for fracture with routine healing: Secondary | ICD-10-CM | POA: Diagnosis not present

## 2017-12-31 DIAGNOSIS — L89150 Pressure ulcer of sacral region, unstageable: Secondary | ICD-10-CM | POA: Diagnosis not present

## 2017-12-31 DIAGNOSIS — I2510 Atherosclerotic heart disease of native coronary artery without angina pectoris: Secondary | ICD-10-CM | POA: Diagnosis not present

## 2018-01-01 ENCOUNTER — Other Ambulatory Visit: Payer: Self-pay | Admitting: Internal Medicine

## 2018-01-04 DIAGNOSIS — Z981 Arthrodesis status: Secondary | ICD-10-CM | POA: Diagnosis not present

## 2018-01-04 DIAGNOSIS — M479 Spondylosis, unspecified: Secondary | ICD-10-CM | POA: Diagnosis not present

## 2018-01-04 DIAGNOSIS — J860 Pyothorax with fistula: Secondary | ICD-10-CM | POA: Diagnosis not present

## 2018-01-04 DIAGNOSIS — K225 Diverticulum of esophagus, acquired: Secondary | ICD-10-CM | POA: Diagnosis not present

## 2018-01-06 DIAGNOSIS — L89150 Pressure ulcer of sacral region, unstageable: Secondary | ICD-10-CM | POA: Diagnosis not present

## 2018-01-06 DIAGNOSIS — M84454D Pathological fracture, pelvis, subsequent encounter for fracture with routine healing: Secondary | ICD-10-CM | POA: Diagnosis not present

## 2018-01-06 DIAGNOSIS — J853 Abscess of mediastinum: Secondary | ICD-10-CM | POA: Diagnosis not present

## 2018-01-06 DIAGNOSIS — E119 Type 2 diabetes mellitus without complications: Secondary | ICD-10-CM | POA: Diagnosis not present

## 2018-01-06 DIAGNOSIS — I2510 Atherosclerotic heart disease of native coronary artery without angina pectoris: Secondary | ICD-10-CM | POA: Diagnosis not present

## 2018-01-06 DIAGNOSIS — S2242XD Multiple fractures of ribs, left side, subsequent encounter for fracture with routine healing: Secondary | ICD-10-CM | POA: Diagnosis not present

## 2018-01-07 DIAGNOSIS — K228 Other specified diseases of esophagus: Secondary | ICD-10-CM | POA: Diagnosis not present

## 2018-01-07 DIAGNOSIS — J869 Pyothorax without fistula: Secondary | ICD-10-CM | POA: Diagnosis not present

## 2018-01-07 DIAGNOSIS — D649 Anemia, unspecified: Secondary | ICD-10-CM | POA: Diagnosis not present

## 2018-01-07 DIAGNOSIS — Z09 Encounter for follow-up examination after completed treatment for conditions other than malignant neoplasm: Secondary | ICD-10-CM | POA: Diagnosis not present

## 2018-01-07 DIAGNOSIS — E1165 Type 2 diabetes mellitus with hyperglycemia: Secondary | ICD-10-CM | POA: Diagnosis not present

## 2018-01-07 DIAGNOSIS — K225 Diverticulum of esophagus, acquired: Secondary | ICD-10-CM | POA: Diagnosis not present

## 2018-01-07 DIAGNOSIS — K9423 Gastrostomy malfunction: Secondary | ICD-10-CM | POA: Diagnosis not present

## 2018-01-08 DIAGNOSIS — M84454D Pathological fracture, pelvis, subsequent encounter for fracture with routine healing: Secondary | ICD-10-CM | POA: Diagnosis not present

## 2018-01-08 DIAGNOSIS — J853 Abscess of mediastinum: Secondary | ICD-10-CM | POA: Diagnosis not present

## 2018-01-08 DIAGNOSIS — L89150 Pressure ulcer of sacral region, unstageable: Secondary | ICD-10-CM | POA: Diagnosis not present

## 2018-01-08 DIAGNOSIS — E119 Type 2 diabetes mellitus without complications: Secondary | ICD-10-CM | POA: Diagnosis not present

## 2018-01-08 DIAGNOSIS — S2242XD Multiple fractures of ribs, left side, subsequent encounter for fracture with routine healing: Secondary | ICD-10-CM | POA: Diagnosis not present

## 2018-01-08 DIAGNOSIS — I2510 Atherosclerotic heart disease of native coronary artery without angina pectoris: Secondary | ICD-10-CM | POA: Diagnosis not present

## 2018-01-11 DIAGNOSIS — Z79891 Long term (current) use of opiate analgesic: Secondary | ICD-10-CM | POA: Diagnosis not present

## 2018-01-11 DIAGNOSIS — M545 Low back pain: Secondary | ICD-10-CM | POA: Diagnosis not present

## 2018-01-12 DIAGNOSIS — E119 Type 2 diabetes mellitus without complications: Secondary | ICD-10-CM | POA: Diagnosis not present

## 2018-01-12 DIAGNOSIS — S2242XD Multiple fractures of ribs, left side, subsequent encounter for fracture with routine healing: Secondary | ICD-10-CM | POA: Diagnosis not present

## 2018-01-12 DIAGNOSIS — L89150 Pressure ulcer of sacral region, unstageable: Secondary | ICD-10-CM | POA: Diagnosis not present

## 2018-01-12 DIAGNOSIS — J853 Abscess of mediastinum: Secondary | ICD-10-CM | POA: Diagnosis not present

## 2018-01-12 DIAGNOSIS — M84454D Pathological fracture, pelvis, subsequent encounter for fracture with routine healing: Secondary | ICD-10-CM | POA: Diagnosis not present

## 2018-01-12 DIAGNOSIS — I2510 Atherosclerotic heart disease of native coronary artery without angina pectoris: Secondary | ICD-10-CM | POA: Diagnosis not present

## 2018-01-13 DIAGNOSIS — I2510 Atherosclerotic heart disease of native coronary artery without angina pectoris: Secondary | ICD-10-CM | POA: Diagnosis not present

## 2018-01-13 DIAGNOSIS — J853 Abscess of mediastinum: Secondary | ICD-10-CM | POA: Diagnosis not present

## 2018-01-13 DIAGNOSIS — M84454D Pathological fracture, pelvis, subsequent encounter for fracture with routine healing: Secondary | ICD-10-CM | POA: Diagnosis not present

## 2018-01-13 DIAGNOSIS — E119 Type 2 diabetes mellitus without complications: Secondary | ICD-10-CM | POA: Diagnosis not present

## 2018-01-13 DIAGNOSIS — S2242XD Multiple fractures of ribs, left side, subsequent encounter for fracture with routine healing: Secondary | ICD-10-CM | POA: Diagnosis not present

## 2018-01-13 DIAGNOSIS — L89150 Pressure ulcer of sacral region, unstageable: Secondary | ICD-10-CM | POA: Diagnosis not present

## 2018-01-14 DIAGNOSIS — K228 Other specified diseases of esophagus: Secondary | ICD-10-CM | POA: Diagnosis not present

## 2018-01-14 DIAGNOSIS — J853 Abscess of mediastinum: Secondary | ICD-10-CM | POA: Diagnosis not present

## 2018-01-14 DIAGNOSIS — I2510 Atherosclerotic heart disease of native coronary artery without angina pectoris: Secondary | ICD-10-CM | POA: Diagnosis not present

## 2018-01-14 DIAGNOSIS — S2242XD Multiple fractures of ribs, left side, subsequent encounter for fracture with routine healing: Secondary | ICD-10-CM | POA: Diagnosis not present

## 2018-01-14 DIAGNOSIS — J860 Pyothorax with fistula: Secondary | ICD-10-CM | POA: Diagnosis not present

## 2018-01-15 DIAGNOSIS — E119 Type 2 diabetes mellitus without complications: Secondary | ICD-10-CM | POA: Diagnosis not present

## 2018-01-15 DIAGNOSIS — M84454D Pathological fracture, pelvis, subsequent encounter for fracture with routine healing: Secondary | ICD-10-CM | POA: Diagnosis not present

## 2018-01-15 DIAGNOSIS — S2242XD Multiple fractures of ribs, left side, subsequent encounter for fracture with routine healing: Secondary | ICD-10-CM | POA: Diagnosis not present

## 2018-01-15 DIAGNOSIS — J853 Abscess of mediastinum: Secondary | ICD-10-CM | POA: Diagnosis not present

## 2018-01-15 DIAGNOSIS — I2510 Atherosclerotic heart disease of native coronary artery without angina pectoris: Secondary | ICD-10-CM | POA: Diagnosis not present

## 2018-01-15 DIAGNOSIS — L89150 Pressure ulcer of sacral region, unstageable: Secondary | ICD-10-CM | POA: Diagnosis not present

## 2018-01-19 DIAGNOSIS — E119 Type 2 diabetes mellitus without complications: Secondary | ICD-10-CM | POA: Diagnosis not present

## 2018-01-19 DIAGNOSIS — M84454D Pathological fracture, pelvis, subsequent encounter for fracture with routine healing: Secondary | ICD-10-CM | POA: Diagnosis not present

## 2018-01-19 DIAGNOSIS — L89150 Pressure ulcer of sacral region, unstageable: Secondary | ICD-10-CM | POA: Diagnosis not present

## 2018-01-19 DIAGNOSIS — J853 Abscess of mediastinum: Secondary | ICD-10-CM | POA: Diagnosis not present

## 2018-01-19 DIAGNOSIS — S2242XD Multiple fractures of ribs, left side, subsequent encounter for fracture with routine healing: Secondary | ICD-10-CM | POA: Diagnosis not present

## 2018-01-19 DIAGNOSIS — I2510 Atherosclerotic heart disease of native coronary artery without angina pectoris: Secondary | ICD-10-CM | POA: Diagnosis not present

## 2018-01-20 DIAGNOSIS — S2242XD Multiple fractures of ribs, left side, subsequent encounter for fracture with routine healing: Secondary | ICD-10-CM | POA: Diagnosis not present

## 2018-01-20 DIAGNOSIS — I2510 Atherosclerotic heart disease of native coronary artery without angina pectoris: Secondary | ICD-10-CM | POA: Diagnosis not present

## 2018-01-20 DIAGNOSIS — M84454D Pathological fracture, pelvis, subsequent encounter for fracture with routine healing: Secondary | ICD-10-CM | POA: Diagnosis not present

## 2018-01-20 DIAGNOSIS — E119 Type 2 diabetes mellitus without complications: Secondary | ICD-10-CM | POA: Diagnosis not present

## 2018-01-20 DIAGNOSIS — L89150 Pressure ulcer of sacral region, unstageable: Secondary | ICD-10-CM | POA: Diagnosis not present

## 2018-01-20 DIAGNOSIS — J853 Abscess of mediastinum: Secondary | ICD-10-CM | POA: Diagnosis not present

## 2018-01-21 DIAGNOSIS — I2510 Atherosclerotic heart disease of native coronary artery without angina pectoris: Secondary | ICD-10-CM | POA: Diagnosis not present

## 2018-01-21 DIAGNOSIS — J853 Abscess of mediastinum: Secondary | ICD-10-CM | POA: Diagnosis not present

## 2018-01-21 DIAGNOSIS — E119 Type 2 diabetes mellitus without complications: Secondary | ICD-10-CM | POA: Diagnosis not present

## 2018-01-21 DIAGNOSIS — S2242XD Multiple fractures of ribs, left side, subsequent encounter for fracture with routine healing: Secondary | ICD-10-CM | POA: Diagnosis not present

## 2018-01-21 DIAGNOSIS — M84454D Pathological fracture, pelvis, subsequent encounter for fracture with routine healing: Secondary | ICD-10-CM | POA: Diagnosis not present

## 2018-01-21 DIAGNOSIS — L89150 Pressure ulcer of sacral region, unstageable: Secondary | ICD-10-CM | POA: Diagnosis not present

## 2018-02-16 ENCOUNTER — Encounter: Payer: Self-pay | Admitting: Internal Medicine

## 2018-02-16 ENCOUNTER — Ambulatory Visit (INDEPENDENT_AMBULATORY_CARE_PROVIDER_SITE_OTHER): Payer: Medicare Other | Admitting: Internal Medicine

## 2018-02-16 VITALS — BP 155/84 | HR 80 | Temp 98.1°F | Ht 62.0 in | Wt 123.0 lb

## 2018-02-16 DIAGNOSIS — K223 Perforation of esophagus: Secondary | ICD-10-CM | POA: Diagnosis not present

## 2018-02-16 DIAGNOSIS — K2289 Other specified disease of esophagus: Secondary | ICD-10-CM

## 2018-02-16 DIAGNOSIS — M4622 Osteomyelitis of vertebra, cervical region: Secondary | ICD-10-CM | POA: Diagnosis not present

## 2018-02-16 DIAGNOSIS — K228 Other specified diseases of esophagus: Secondary | ICD-10-CM

## 2018-02-16 MED ORDER — FLUCONAZOLE 200 MG PO TABS: 600.0000 mg | ORAL_TABLET | Freq: Every day | ORAL | 1 refills | Status: DC

## 2018-02-16 NOTE — Progress Notes (Signed)
Patient ID: [name], female   DOB: 15-Jun-1946, 72 y.o.   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  She was discharged 12/07/17 initially on ertapenem but now on amoxicillin and high dose fluconazole 600mg  daily  - but she ran out of fluconazole roughly 6-8 wks ago  Last visit she was having drainage from her PEG which has now improved. She is still using 3 to 4 cans per day to hit goal of 1900 cal per day,Still not gained significant weight back Neck region well healed. She Had repeat CT imaging that showed improvement and swallow study that she passed and has been eating by mouth without too much difficulty      Outpatient Encounter Medications as of 02/16/2018  Medication Sig  . amoxicillin-clavulanate (AUGMENTIN) 875-125 MG tablet Take 1 tablet by mouth 2 (two) times daily.  Marland Kitchen gabapentin (NEURONTIN) 600 MG tablet Take 600 mg by mouth 4 (four) times daily.   Marland Kitchen morphine (MS CONTIN) 30 MG 12 hr tablet Take 30 mg by mouth 4 (four) times daily.   Marland Kitchen oxyCODONE (ROXICODONE) 15 MG immediate release tablet Take 15 mg by mouth 4 (four) times daily.  Marland Kitchen albuterol (PROVENTIL HFA;VENTOLIN HFA) 108 (90 BASE) MCG/ACT inhaler Inhale 2 puffs into the lungs every 6 (six) hours as needed for wheezing.   Marland Kitchen ALPRAZolam (XANAX) 0.25 MG tablet Take one tablet daily or as needed  . budesonide-formoterol (SYMBICORT) 160-4.5 MCG/ACT inhaler Inhale 2 puffs into the lungs 2 (two) times daily as needed (Wheezing). Reported on 10/16/2015    . cholestyramine light (PREVALITE) 4 g packet Take 1 packet (4 g total) by mouth every 12 (twelve) hours.  . diclofenac (VOLTAREN) 75 MG EC tablet Take 75 mg by mouth daily as needed for moderate pain.   . fluconazole (DIFLUCAN) 2 mg/mL SOLN IVPB Inject 50 mLs (100 mg total) into the vein daily. (Patient not taking: Reported on 12/29/2017)  . losartan (COZAAR) 25 MG tablet Take 25 mg by mouth daily.   . piperacillin-tazobactam (ZOSYN) 3.375 GM/50ML IVPB Inject 50 mLs (3.375 g total) into the vein every 8 (eight) hours. (Patient not taking: Reported on 12/29/2017)  . Vancomycin (VANCOCIN) 750-5 MG/150ML-% SOLN Inject 150 mLs (750 mg total) into the vein daily. (Patient not taking: Reported on 10/26/2017)   No facility-administered encounter medications on file as of 02/16/2018.      Patient Active Problem List   Diagnosis Date Noted  . Pressure injury of skin 09/22/2017  . Chronic pain 09/20/2017  . Abscess of upper lobe of left lung with pneumonia ([place])   . Tobacco consumption 02/12/2016  . Hoarseness of voice 01/07/2016  . 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 Infected wound, initial encounter 06/20/2015  . Cholecystitis   . Esophageal fistula   . Abscess   . Discitis   . HCAP (healthcare-associated pneumonia)   . 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[place]) 04/28/2015  . Abscess of neck   . Esophageal perforation   . Loss of weight   . Esophageal dysphagia   . Recurrent  dislocation of hip joint prosthesis ([place]) 04/26/2015  . Hip dislocation, right ([place]) 04/26/2015  . Healthcare-associated pneumonia 04/26/2015  . Cholecystitis, acute 04/26/2015  . 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[place]) 04/26/2015  . PAF (paroxysmal atrial fibrillation) ([place]) 09/20/2014  . Elevated troponin 09/20/2014  . Acute renal failure syndrome ([place])   . Malnutrition of moderate degree ([place]) 08/29/2014  . Pneumonia   . Respiratory failure ([place])   . UTI (urinary tract infection) 08/27/2014   . Sepsis, unspecified organism ([place]) 08/27/2014  . Atrial fibrillation with RVR ([place]) 08/27/2014  . Shock circulatory ([place]) 08/27/2014  . Acute kidney injury ([place]) 08/27/2014  . Encephalopathy acute 08/27/2014  . Hypokalemia 08/26/2014  . Hyponatremia 08/26/2014  . Acute renal failure ([place]) 08/26/2014  . Weakness 08/26/2014  . ETOH abuse 08/07/2014  . Edema extremities 07/12/2014  . Atelectasis 08/16/2011  . Diabetes mellitus type 2, insulin dependent ([place])   . COPD (chronic obstructive pulmonary disease) ([place])   . Tobacco dependence   . Hyperlipidemia   . Essential hypertension   . Coronary artery disease      Health Maintenance Due  Topic Date Due  . Hepatitis C Screening  04/27[birth year]  . FOOT EXAM  02/09/1958  . OPHTHALMOLOGY EXAM  02/09/1958  . COLONOSCOPY  02/09/1998  . DEXA SCAN  02/09/2013  . PNA vac Low Risk Adult (2 of 2 - PPSV23) 02/09/2013  . MAMMOGRAM  10/10/2016  . HEMOGLOBIN A1C  05/10/2017     Review of Systems 12 point ros is negative except for still slow at gain weight back Physical Exam   BP (!) 155/84   Pulse 80   Temp 98.1 F (36.7 C) (Oral)   Ht 5\' 2"  (1.575 m)   Wt 123 lb (55.8 kg)   BMI 22.50 kg/m   Physical Exam  Constitutional:  oriented to person, place, and time. appears well-developed and well-nourished. No distress.  HENT: [place]/AT, PERRLA, no scleral icterus Mouth/Throat: Oropharynx is clear and moist. No oropharyngeal exudate.  Cardiovascular: Normal rate, regular rhythm and normal heart sounds. Exam reveals no gallop and no friction rub.  No murmur heard.  Pulmonary/Chest: Effort normal and breath sounds normal. No respiratory distress.  has no wheezes.  Neck = supple, no nuchal rigidity. Surgical scar healed. No drainage, erythema or fluctuance Abdominal: Soft. Bowel sounds are normal.  exhibits no distension. There is no tenderness. Peg in place, dry dressing Skin: Skin is warm and dry. No rash noted. No erythema.  Psychiatric: a  normal mood and affect.  behavior is normal.   CBC Lab Results  Component Value Date   WBC 10.0 09/22/2017   RBC 3.59 (L) 09/22/2017   HGB 10.0 (L) 09/22/2017   HCT 30.5 (L) 09/22/2017   PLT 370 09/22/2017   MCV 85.0 09/22/2017   MCH 27.9 09/22/2017   MCHC 32.8 09/22/2017   RDW 12.9 09/22/2017   LYMPHSABS 1.0 09/22/2017   MONOABS 0.8 09/22/2017   EOSABS 0.0 09/22/2017    BMET Lab Results  Component Value Date   NA 133 (L) 12/29/2017   K 4.7 12/29/2017   CL 96 (L) 12/29/2017   CO2 25 12/29/2017   GLUCOSE 118 (H) 12/29/2017   BUN 25 12/29/2017   CREATININE 0.58 12/29/2017   CALCIUM 9.0 12/29/2017   GFRNONAA 94 12/29/2017   GFRAA 109 12/29/2017   Lab Results  Component Value Date   ESRSEDRATE 75 (H) 02/16/2018   Lab Results  Component Value Date   CRP 6.0 02/16/2018  dislocation of hip joint prosthesis ([place]) 04/26/2015  . Hip dislocation, right ([place]) 04/26/2015  . Healthcare-associated pneumonia 04/26/2015  . Cholecystitis, acute 04/26/2015  . 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[place]) 04/26/2015  . PAF (paroxysmal atrial fibrillation) ([place]) 09/20/2014  . Elevated troponin 09/20/2014  . Acute renal failure syndrome ([place])   . Malnutrition of moderate degree ([place]) 08/29/2014  . Pneumonia   . Respiratory failure ([place])   . UTI (urinary tract infection) 08/27/2014   . Sepsis, unspecified organism ([place]) 08/27/2014  . Atrial fibrillation with RVR ([place]) 08/27/2014  . Shock circulatory ([place]) 08/27/2014  . Acute kidney injury ([place]) 08/27/2014  . Encephalopathy acute 08/27/2014  . Hypokalemia 08/26/2014  . Hyponatremia 08/26/2014  . Acute renal failure ([place]) 08/26/2014  . Weakness 08/26/2014  . ETOH abuse 08/07/2014  . Edema extremities 07/12/2014  . Atelectasis 08/16/2011  . Diabetes mellitus type 2, insulin dependent ([place])   . COPD (chronic obstructive pulmonary disease) ([place])   . Tobacco dependence   . Hyperlipidemia   . Essential hypertension   . Coronary artery disease      Health Maintenance Due  Topic Date Due  . Hepatitis C Screening  04/27/48  . FOOT EXAM  02/09/1958  . OPHTHALMOLOGY EXAM  02/09/1958  . COLONOSCOPY  02/09/1998  . DEXA SCAN  02/09/2013  . PNA vac Low Risk Adult (2 of 2 - PPSV23) 02/09/2013  . MAMMOGRAM  10/10/2016  . HEMOGLOBIN A1C  05/10/2017     Review of Systems 12 point ros is negative except for still slow at gain weight back Physical Exam   BP (!) 155/84   Pulse 80   Temp 98.1 F (36.7 C) (Oral)   Ht 5\' 2"  (1.575 m)   Wt 123 lb (55.8 kg)   BMI 22.50 kg/m   Physical Exam  Constitutional:  oriented to person, place, and time. appears well-developed and well-nourished. No distress.  HENT: [place]/AT, PERRLA, no scleral icterus Mouth/Throat: Oropharynx is clear and moist. No oropharyngeal exudate.  Cardiovascular: Normal rate, regular rhythm and normal heart sounds. Exam reveals no gallop and no friction rub.  No murmur heard.  Pulmonary/Chest: Effort normal and breath sounds normal. No respiratory distress.  has no wheezes.  Neck = supple, no nuchal rigidity. Surgical scar healed. No drainage, erythema or fluctuance Abdominal: Soft. Bowel sounds are normal.  exhibits no distension. There is no tenderness. Peg in place, dry dressing Skin: Skin is warm and dry. No rash noted. No erythema.  Psychiatric: a  normal mood and affect.  behavior is normal.   CBC Lab Results  Component Value Date   WBC 10.0 09/22/2017   RBC 3.59 (L) 09/22/2017   HGB 10.0 (L) 09/22/2017   HCT 30.5 (L) 09/22/2017   PLT 370 09/22/2017   MCV 85.0 09/22/2017   MCH 27.9 09/22/2017   MCHC 32.8 09/22/2017   RDW 12.9 09/22/2017   LYMPHSABS 1.0 09/22/2017   MONOABS 0.8 09/22/2017   EOSABS 0.0 09/22/2017    BMET Lab Results  Component Value Date   NA 133 (L) 12/29/2017   K 4.7 12/29/2017   CL 96 (L) 12/29/2017   CO2 25 12/29/2017   GLUCOSE 118 (H) 12/29/2017   BUN 25 12/29/2017   CREATININE 0.58 12/29/2017   CALCIUM 9.0 12/29/2017   GFRNONAA 94 12/29/2017   GFRAA 109 12/29/2017   Lab Results  Component Value Date   ESRSEDRATE 75 (H) 02/16/2018   Lab Results  Component Value Date   CRP 6.0 02/16/2018

## 2018-02-17 LAB — CBC WITH DIFFERENTIAL/PLATELET
Basophils Absolute: 34 cells/uL (ref 0–200)
Basophils Relative: 0.4 %
EOS ABS: 84 {cells}/uL (ref 15–500)
Eosinophils Relative: 1 %
HCT: 36 % (ref 35.0–45.0)
Hemoglobin: 11.8 g/dL (ref 11.7–15.5)
Lymphs Abs: 1714 cells/uL (ref 850–3900)
MCH: 28 pg (ref 27.0–33.0)
MCHC: 32.8 g/dL (ref 32.0–36.0)
MCV: 85.5 fL (ref 80.0–100.0)
MPV: 10.7 fL (ref 7.5–12.5)
Monocytes Relative: 8 %
Neutro Abs: 5897 cells/uL (ref 1500–7800)
Neutrophils Relative %: 70.2 %
PLATELETS: 273 10*3/uL (ref 140–400)
RBC: 4.21 10*6/uL (ref 3.80–5.10)
RDW: 14.9 % (ref 11.0–15.0)
TOTAL LYMPHOCYTE: 20.4 %
WBC: 8.4 10*3/uL (ref 3.8–10.8)
WBCMIX: 672 {cells}/uL (ref 200–950)

## 2018-02-17 LAB — COMPLETE METABOLIC PANEL WITH GFR
AG RATIO: 1.1 (calc) (ref 1.0–2.5)
ALKALINE PHOSPHATASE (APISO): 152 U/L — AB (ref 33–130)
ALT: 13 U/L (ref 6–29)
AST: 18 U/L (ref 10–35)
Albumin: 4 g/dL (ref 3.6–5.1)
BILIRUBIN TOTAL: 0.3 mg/dL (ref 0.2–1.2)
BUN/Creatinine Ratio: 28 (calc) — ABNORMAL HIGH (ref 6–22)
BUN: 15 mg/dL (ref 7–25)
CHLORIDE: 101 mmol/L (ref 98–110)
CO2: 26 mmol/L (ref 20–32)
Calcium: 9.2 mg/dL (ref 8.6–10.4)
Creat: 0.54 mg/dL — ABNORMAL LOW (ref 0.60–0.93)
GFR, Est African American: 111 mL/min/{1.73_m2} (ref >=60)
GFR, Est Non African American: 96 mL/min/{1.73_m2} (ref >=60)
Globulin: 3.8 g/dL (calc) — ABNORMAL HIGH (ref 1.9–3.7)
Glucose, Bld: 101 mg/dL — ABNORMAL HIGH (ref 65–99)
POTASSIUM: 3.9 mmol/L (ref 3.5–5.3)
Sodium: 137 mmol/L (ref 135–146)
Total Protein: 7.8 g/dL (ref 6.1–8.1)

## 2018-02-17 LAB — C-REACTIVE PROTEIN: CRP: 6 mg/L (ref <8.0)

## 2018-02-17 LAB — SEDIMENTATION RATE: Sed Rate: 75 mm/h — ABNORMAL HIGH (ref 0–30)

## 2018-02-18 DIAGNOSIS — D649 Anemia, unspecified: Secondary | ICD-10-CM | POA: Diagnosis not present

## 2018-02-18 DIAGNOSIS — E1165 Type 2 diabetes mellitus with hyperglycemia: Secondary | ICD-10-CM | POA: Diagnosis not present

## 2018-02-18 DIAGNOSIS — K228 Other specified diseases of esophagus: Secondary | ICD-10-CM | POA: Diagnosis not present

## 2018-02-18 DIAGNOSIS — R05 Cough: Secondary | ICD-10-CM | POA: Diagnosis not present

## 2018-02-18 DIAGNOSIS — K225 Diverticulum of esophagus, acquired: Secondary | ICD-10-CM | POA: Diagnosis not present

## 2018-02-18 DIAGNOSIS — L659 Nonscarring hair loss, unspecified: Secondary | ICD-10-CM | POA: Diagnosis not present

## 2018-02-18 DIAGNOSIS — K9423 Gastrostomy malfunction: Secondary | ICD-10-CM | POA: Diagnosis not present

## 2018-02-18 DIAGNOSIS — R7303 Prediabetes: Secondary | ICD-10-CM | POA: Diagnosis not present

## 2018-02-18 DIAGNOSIS — D500 Iron deficiency anemia secondary to blood loss (chronic): Secondary | ICD-10-CM | POA: Diagnosis not present

## 2018-02-24 DIAGNOSIS — Z87891 Personal history of nicotine dependence: Secondary | ICD-10-CM | POA: Diagnosis not present

## 2018-02-24 DIAGNOSIS — R221 Localized swelling, mass and lump, neck: Secondary | ICD-10-CM | POA: Diagnosis not present

## 2018-02-24 DIAGNOSIS — J860 Pyothorax with fistula: Secondary | ICD-10-CM | POA: Diagnosis not present

## 2018-02-24 DIAGNOSIS — J479 Bronchiectasis, uncomplicated: Secondary | ICD-10-CM | POA: Diagnosis not present

## 2018-02-24 DIAGNOSIS — J851 Abscess of lung with pneumonia: Secondary | ICD-10-CM | POA: Diagnosis not present

## 2018-02-24 DIAGNOSIS — K228 Other specified diseases of esophagus: Secondary | ICD-10-CM | POA: Diagnosis not present

## 2018-02-25 DIAGNOSIS — F329 Major depressive disorder, single episode, unspecified: Secondary | ICD-10-CM | POA: Diagnosis present

## 2018-02-25 DIAGNOSIS — K228 Other specified diseases of esophagus: Secondary | ICD-10-CM | POA: Diagnosis not present

## 2018-02-25 DIAGNOSIS — D638 Anemia in other chronic diseases classified elsewhere: Secondary | ICD-10-CM | POA: Diagnosis present

## 2018-02-25 DIAGNOSIS — J90 Pleural effusion, not elsewhere classified: Secondary | ICD-10-CM | POA: Diagnosis not present

## 2018-02-25 DIAGNOSIS — I4891 Unspecified atrial fibrillation: Secondary | ICD-10-CM | POA: Diagnosis present

## 2018-02-25 DIAGNOSIS — E785 Hyperlipidemia, unspecified: Secondary | ICD-10-CM | POA: Diagnosis present

## 2018-02-25 DIAGNOSIS — J390 Retropharyngeal and parapharyngeal abscess: Secondary | ICD-10-CM | POA: Diagnosis present

## 2018-02-25 DIAGNOSIS — R1314 Dysphagia, pharyngoesophageal phase: Secondary | ICD-10-CM | POA: Diagnosis present

## 2018-02-25 DIAGNOSIS — R918 Other nonspecific abnormal finding of lung field: Secondary | ICD-10-CM | POA: Diagnosis not present

## 2018-02-25 DIAGNOSIS — I2510 Atherosclerotic heart disease of native coronary artery without angina pectoris: Secondary | ICD-10-CM | POA: Diagnosis present

## 2018-02-25 DIAGNOSIS — R490 Dysphonia: Secondary | ICD-10-CM | POA: Diagnosis present

## 2018-02-25 DIAGNOSIS — L03221 Cellulitis of neck: Secondary | ICD-10-CM | POA: Diagnosis present

## 2018-02-25 DIAGNOSIS — E611 Iron deficiency: Secondary | ICD-10-CM | POA: Diagnosis present

## 2018-02-25 DIAGNOSIS — L89151 Pressure ulcer of sacral region, stage 1: Secondary | ICD-10-CM | POA: Diagnosis present

## 2018-02-25 DIAGNOSIS — T8149XA Infection following a procedure, other surgical site, initial encounter: Secondary | ICD-10-CM | POA: Diagnosis present

## 2018-02-25 DIAGNOSIS — K225 Diverticulum of esophagus, acquired: Secondary | ICD-10-CM | POA: Diagnosis present

## 2018-02-25 DIAGNOSIS — Z431 Encounter for attention to gastrostomy: Secondary | ICD-10-CM | POA: Diagnosis not present

## 2018-02-25 DIAGNOSIS — F419 Anxiety disorder, unspecified: Secondary | ICD-10-CM | POA: Diagnosis present

## 2018-02-25 DIAGNOSIS — E119 Type 2 diabetes mellitus without complications: Secondary | ICD-10-CM | POA: Diagnosis present

## 2018-02-25 DIAGNOSIS — G894 Chronic pain syndrome: Secondary | ICD-10-CM | POA: Diagnosis present

## 2018-02-25 DIAGNOSIS — I10 Essential (primary) hypertension: Secondary | ICD-10-CM | POA: Diagnosis present

## 2018-02-25 DIAGNOSIS — J449 Chronic obstructive pulmonary disease, unspecified: Secondary | ICD-10-CM | POA: Diagnosis present

## 2018-03-01 DIAGNOSIS — M1990 Unspecified osteoarthritis, unspecified site: Secondary | ICD-10-CM | POA: Insufficient documentation

## 2018-03-02 MED ORDER — GENERIC EXTERNAL MEDICATION
2.00 | Status: DC
Start: 2018-03-02 — End: 2018-03-02

## 2018-03-02 MED ORDER — GENERIC EXTERNAL MEDICATION
1.00 | Status: DC
Start: 2018-03-03 — End: 2018-03-02

## 2018-03-02 MED ORDER — METRONIDAZOLE 500 MG PO TABS
500.00 | ORAL_TABLET | ORAL | Status: DC
Start: 2018-03-02 — End: 2018-03-02

## 2018-03-02 MED ORDER — MORPHINE SULFATE 15 MG PO TABS
30.00 | ORAL_TABLET | ORAL | Status: DC
Start: 2018-03-02 — End: 2018-03-02

## 2018-03-02 MED ORDER — FERROUS SULFATE 325 (65 FE) MG PO TABS
325.00 | ORAL_TABLET | ORAL | Status: DC
Start: 2018-03-02 — End: 2018-03-02

## 2018-03-02 MED ORDER — OXYCODONE HCL 5 MG PO TABS
5.00 | ORAL_TABLET | ORAL | Status: DC
Start: ? — End: 2018-03-02

## 2018-03-02 MED ORDER — CELECOXIB 100 MG PO CAPS
100.00 | ORAL_CAPSULE | ORAL | Status: DC
Start: 2018-03-02 — End: 2018-03-02

## 2018-03-02 MED ORDER — ENOXAPARIN SODIUM 40 MG/0.4ML ~~LOC~~ SOLN
40.00 | SUBCUTANEOUS | Status: DC
Start: 2018-03-03 — End: 2018-03-02

## 2018-03-02 MED ORDER — SULFAMETHOXAZOLE-TRIMETHOPRIM 800-160 MG PO TABS
2.00 | ORAL_TABLET | ORAL | Status: DC
Start: 2018-03-02 — End: 2018-03-02

## 2018-03-02 MED ORDER — FIBER (GUAR GUM) PO
1.00 | ORAL | Status: DC
Start: 2018-03-02 — End: 2018-03-02

## 2018-03-02 MED ORDER — ALBUTEROL SULFATE HFA 108 (90 BASE) MCG/ACT IN AERS
2.00 | INHALATION_SPRAY | RESPIRATORY_TRACT | Status: DC
Start: ? — End: 2018-03-02

## 2018-03-02 MED ORDER — DOCUSATE SODIUM 100 MG PO CAPS
100.00 | ORAL_CAPSULE | ORAL | Status: DC
Start: ? — End: 2018-03-02

## 2018-03-02 MED ORDER — FLUCONAZOLE 200 MG PO TABS
600.00 | ORAL_TABLET | ORAL | Status: DC
Start: 2018-03-03 — End: 2018-03-02

## 2018-03-02 MED ORDER — GENERIC EXTERNAL MEDICATION
Status: DC
Start: 2018-03-02 — End: 2018-03-02

## 2018-03-02 MED ORDER — CHOLESTYRAMINE LIGHT 4 G PO PACK
4.00 g | PACK | ORAL | Status: DC
Start: 2018-03-02 — End: 2018-03-02

## 2018-03-02 MED ORDER — GABAPENTIN 600 MG PO TABS
600.00 | ORAL_TABLET | ORAL | Status: DC
Start: 2018-03-02 — End: 2018-03-02

## 2018-03-02 MED ORDER — ALPRAZOLAM 0.25 MG PO TABS
0.25 | ORAL_TABLET | ORAL | Status: DC
Start: ? — End: 2018-03-02

## 2018-03-02 MED ORDER — NYSTATIN 100000 UNIT/GM EX POWD
CUTANEOUS | Status: DC
Start: 2018-03-02 — End: 2018-03-02

## 2018-03-10 DIAGNOSIS — K228 Other specified diseases of esophagus: Secondary | ICD-10-CM | POA: Diagnosis not present

## 2018-03-10 DIAGNOSIS — L03221 Cellulitis of neck: Secondary | ICD-10-CM | POA: Diagnosis not present

## 2018-03-10 DIAGNOSIS — K9189 Other postprocedural complications and disorders of digestive system: Secondary | ICD-10-CM | POA: Diagnosis not present

## 2018-03-22 ENCOUNTER — Encounter: Payer: Self-pay | Admitting: Internal Medicine

## 2018-03-22 ENCOUNTER — Ambulatory Visit (INDEPENDENT_AMBULATORY_CARE_PROVIDER_SITE_OTHER): Payer: Medicare Other | Admitting: Internal Medicine

## 2018-03-22 ENCOUNTER — Telehealth: Payer: Self-pay

## 2018-03-22 VITALS — BP 121/72 | HR 75 | Temp 98.0°F | Wt 124.0 lb

## 2018-03-22 DIAGNOSIS — E43 Unspecified severe protein-calorie malnutrition: Secondary | ICD-10-CM | POA: Diagnosis not present

## 2018-03-22 DIAGNOSIS — L659 Nonscarring hair loss, unspecified: Secondary | ICD-10-CM

## 2018-03-22 DIAGNOSIS — L0211 Cutaneous abscess of neck: Secondary | ICD-10-CM | POA: Diagnosis not present

## 2018-03-22 DIAGNOSIS — K223 Perforation of esophagus: Secondary | ICD-10-CM

## 2018-03-22 MED ORDER — AMOXICILLIN-POT CLAVULANATE 875-125 MG PO TABS: 1.0000 | ORAL_TABLET | Freq: Two times a day (BID) | ORAL | 3 refills | Status: DC

## 2018-03-22 MED ORDER — METRONIDAZOLE 500 MG PO TABS: 500.0000 mg | ORAL_TABLET | Freq: Three times a day (TID) | ORAL | 3 refills | Status: DC

## 2018-03-22 MED ORDER — SULFAMETHOXAZOLE-TRIMETHOPRIM 800-160 MG PO TABS: 1.0000 | ORAL_TABLET | Freq: Two times a day (BID) | ORAL | 3 refills | Status: DC

## 2018-03-22 MED ORDER — FLUCONAZOLE 200 MG PO TABS
600.0000 mg | ORAL_TABLET | Freq: Every day | ORAL | 3 refills | Status: DC
Start: 2018-03-22 — End: 2018-06-01

## 2018-03-22 NOTE — Progress Notes (Signed)
Encephalopathy acute 08/27/2014  . Hypokalemia 08/26/2014  . Hyponatremia 08/26/2014  . Acute renal failure ([place]) 08/26/2014  . Weakness 08/26/2014  . ETOH abuse 08/07/2014  . Edema extremities 07/12/2014  . Atelectasis 08/16/2011  . Diabetes mellitus type 2, insulin dependent ([place])   . COPD (chronic obstructive pulmonary disease) ([place])   . Tobacco dependence   . Hyperlipidemia   . Essential hypertension   . Coronary artery disease      Health  Maintenance Due  Topic Date Due  . Hepatitis C Screening  Nov 17, 1945  . FOOT EXAM  02/09/1958  . OPHTHALMOLOGY EXAM  02/09/1958  . COLONOSCOPY  02/09/1998  . DEXA SCAN  02/09/2013  . PNA vac Low Risk Adult (2 of 2 - PPSV23) 02/09/2013  . MAMMOGRAM  10/10/2016  . HEMOGLOBIN A1C  05/10/2017     Review of Systems  Physical Exam   BP 121/72   Pulse 75   Temp 98 F (36.7 C) (Oral)   Wt 124 lb (56.2 kg)   BMI 22.68 kg/m   Physical Exam  Constitutional:  oriented to person, place, and time. appears frail and under-nourished. No distress.  HENT: [place]/AT, PERRLA, no scleral icterus, diffuse hairloss/alopecia Mouth/Throat: Oropharynx is clear and moist. No oropharyngeal exudate.  Cardiovascular: Normal rate, regular rhythm and normal heart sounds. Exam reveals no gallop and no friction rub.  No murmur heard.  Pulmonary/Chest: Effort normal and breath sounds normal. No respiratory distress.  has no wheezes.  Neck = supple, no nuchal rigidity Abdominal: Soft. Bowel sounds are normal.  exhibits no distension. There is no tenderness. PEG in place Lymphadenopathy: no cervical adenopathy. No axillary adenopathy Neurological: alert and oriented to person, place, and time.  Skin: Skin is warm and dry. No rash noted. No erythema.  Psychiatric: a normal mood and affect.  behavior is normal.   CBC Lab Results  Component Value Date   WBC 8.4 02/16/2018   RBC 4.21 02/16/2018   HGB 11.8 02/16/2018   HCT 36.0 02/16/2018   PLT 273 02/16/2018   MCV 85.5 02/16/2018   MCH 28.0 02/16/2018   MCHC 32.8 02/16/2018   RDW 14.9 02/16/2018   LYMPHSABS 1,714 02/16/2018   MONOABS 0.8 09/22/2017   EOSABS 84 02/16/2018    BMET Lab Results  Component Value Date   NA 137 02/16/2018   K 3.9 02/16/2018   CL 101 02/16/2018   CO2 26 02/16/2018   GLUCOSE 101 (H) 02/16/2018   BUN 15 02/16/2018   CREATININE 0.54 (L) 02/16/2018   CALCIUM 9.2 02/16/2018   GFRNONAA 96 02/16/2018   GFRAA 111 02/16/2018     Lab Results  Component Value Date   ESRSEDRATE 75 (H) 02/16/2018     Assessment and Plan Hx of pulmonary abscess/remote discitis = - will change to bactrim, metronidazole and fluconazole treat for addn 4 wk+ and follow markers  Moderate protein-caloric malnutrition = continue with tube feeds  Alopecia= likely multifactorial. Recommend to keep watching to see if improves as she has nutrition improved nad treating illness  Patient ID: [name], female   DOB: [birth year], 72 y.o.   MRN: [id number]  HPI 72yo F with history of esophageal diverticuli/fistula causing pulmonary abscess/fistula track. She had to have readmission since we last saw her since she had what describes leak and fluid collection/abscess collected at her previous incision which was drained. She was placed on IV abtx but now has completed them.  She has a repeat swallow evaluation in the coming month. Still continues with 4 cans of tube feeds/day  Since we last saw her she has had increase in weight though she has noticeable thinning of hair  Outpatient Encounter Medications as of 03/22/2018  Medication Sig  . albuterol (PROVENTIL HFA;VENTOLIN HFA) 108 (90 BASE) MCG/ACT inhaler Inhale 2 puffs into the lungs every 6 (six) hours as needed for wheezing.   Marland Kitchen ALPRAZolam (XANAX) 0.25 MG tablet Take one tablet daily or as needed  . amoxicillin-clavulanate (AUGMENTIN) 875-125 MG tablet Take 1 tablet by mouth 2 (two) times daily.  . budesonide-formoterol (SYMBICORT) 160-4.5 MCG/ACT inhaler Inhale 2 puffs into the lungs 2 (two) times daily as needed (Wheezing). Reported on 10/16/2015  . cholestyramine light (PREVALITE) 4 g packet Take 1 packet (4 g total) by mouth every 12 (twelve) hours.  . diclofenac (VOLTAREN) 75 MG EC tablet Take 75 mg by mouth daily as needed for moderate pain.   . fluconazole (DIFLUCAN) 200 MG tablet Take 3 tablets (600 mg total) by mouth daily.  Marland Kitchen gabapentin (NEURONTIN) 600 MG tablet Take 600 mg by mouth 4 (four) times daily.   Marland Kitchen losartan (COZAAR) 25 MG tablet Take 25 mg by mouth daily.   Marland Kitchen morphine (MS CONTIN) 30 MG 12 hr tablet Take 30 mg by mouth 4 (four) times daily.   Marland Kitchen oxyCODONE (ROXICODONE) 15 MG immediate release tablet Take 15 mg by mouth 4 (four) times daily.  . [DISCONTINUED] amoxicillin-clavulanate (AUGMENTIN) 875-125 MG tablet Take 1 tablet by mouth 2 (two) times daily.  . [DISCONTINUED] fluconazole (DIFLUCAN)  200 MG tablet Take 3 tablets (600 mg total) by mouth daily.   No facility-administered encounter medications on file as of 03/22/2018.      Patient Active Problem List   Diagnosis Date Noted  . Pressure injury of skin 09/22/2017  . Chronic pain 09/20/2017  . Abscess of upper lobe of left lung with pneumonia ([place])   . Tobacco consumption 02/12/2016  . Hoarseness of voice 01/07/2016  . 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 Infected wound, initial encounter 06/20/2015  . Cholecystitis   . Esophageal fistula   . Abscess   . Discitis   . HCAP (healthcare-associated pneumonia)   . 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[place]) 04/28/2015  . Abscess of neck   . Esophageal perforation   . Loss of weight   . Esophageal dysphagia   . Recurrent dislocation of hip joint prosthesis ([place]) 04/26/2015  . Hip dislocation, right ([place]) 04/26/2015  . Healthcare-associated pneumonia 04/26/2015  . Cholecystitis, acute 04/26/2015  . 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[place]) 04/26/2015  . PAF (paroxysmal atrial fibrillation) ([place]) 09/20/2014  . Elevated troponin 09/20/2014  . Acute renal failure syndrome ([place])   . Malnutrition of moderate degree ([place]) 08/29/2014  . Pneumonia   . Respiratory failure ([place])   . UTI (urinary tract infection) 08/27/2014  . Sepsis, unspecified organism ([place]) 08/27/2014  . Atrial fibrillation with RVR ([place]) 08/27/2014  . Shock circulatory ([place]) 08/27/2014  . Acute kidney injury ([place]) 08/27/2014  .

## 2018-03-22 NOTE — Telephone Encounter (Signed)
Called pharmacy per Dr. Baxter Flattery to cancel a prescription sent in error (Augmentin). Spoke with the pharmacist who was able to cancel this prescription for [place]. [place]

## 2018-04-01 DIAGNOSIS — D649 Anemia, unspecified: Secondary | ICD-10-CM | POA: Diagnosis not present

## 2018-04-01 DIAGNOSIS — K225 Diverticulum of esophagus, acquired: Secondary | ICD-10-CM | POA: Diagnosis not present

## 2018-04-01 DIAGNOSIS — R634 Abnormal weight loss: Secondary | ICD-10-CM | POA: Diagnosis not present

## 2018-04-01 DIAGNOSIS — R05 Cough: Secondary | ICD-10-CM | POA: Diagnosis not present

## 2018-04-01 DIAGNOSIS — R7303 Prediabetes: Secondary | ICD-10-CM | POA: Diagnosis not present

## 2018-04-01 DIAGNOSIS — K9423 Gastrostomy malfunction: Secondary | ICD-10-CM | POA: Diagnosis not present

## 2018-04-01 DIAGNOSIS — L659 Nonscarring hair loss, unspecified: Secondary | ICD-10-CM | POA: Diagnosis not present

## 2018-04-01 DIAGNOSIS — E1165 Type 2 diabetes mellitus with hyperglycemia: Secondary | ICD-10-CM | POA: Diagnosis not present

## 2018-04-01 DIAGNOSIS — K228 Other specified diseases of esophagus: Secondary | ICD-10-CM | POA: Diagnosis not present

## 2018-04-01 DIAGNOSIS — D500 Iron deficiency anemia secondary to blood loss (chronic): Secondary | ICD-10-CM | POA: Diagnosis not present

## 2018-04-12 DIAGNOSIS — K228 Other specified diseases of esophagus: Secondary | ICD-10-CM | POA: Diagnosis not present

## 2018-04-12 DIAGNOSIS — K225 Diverticulum of esophagus, acquired: Secondary | ICD-10-CM | POA: Diagnosis not present

## 2018-04-23 ENCOUNTER — Telehealth: Payer: Self-pay | Admitting: *Deleted

## 2018-04-23 DIAGNOSIS — E871 Hypo-osmolality and hyponatremia: Secondary | ICD-10-CM | POA: Diagnosis present

## 2018-04-23 DIAGNOSIS — Z87891 Personal history of nicotine dependence: Secondary | ICD-10-CM | POA: Diagnosis not present

## 2018-04-23 DIAGNOSIS — M542 Cervicalgia: Secondary | ICD-10-CM | POA: Diagnosis not present

## 2018-04-23 DIAGNOSIS — L0211 Cutaneous abscess of neck: Secondary | ICD-10-CM | POA: Insufficient documentation

## 2018-04-23 DIAGNOSIS — B952 Enterococcus as the cause of diseases classified elsewhere: Secondary | ICD-10-CM | POA: Diagnosis present

## 2018-04-23 DIAGNOSIS — E119 Type 2 diabetes mellitus without complications: Secondary | ICD-10-CM | POA: Diagnosis present

## 2018-04-23 DIAGNOSIS — J90 Pleural effusion, not elsewhere classified: Secondary | ICD-10-CM | POA: Diagnosis not present

## 2018-04-23 DIAGNOSIS — I2510 Atherosclerotic heart disease of native coronary artery without angina pectoris: Secondary | ICD-10-CM | POA: Diagnosis present

## 2018-04-23 DIAGNOSIS — Z9181 History of falling: Secondary | ICD-10-CM | POA: Diagnosis not present

## 2018-04-23 DIAGNOSIS — Z90710 Acquired absence of both cervix and uterus: Secondary | ICD-10-CM | POA: Diagnosis not present

## 2018-04-23 DIAGNOSIS — I10 Essential (primary) hypertension: Secondary | ICD-10-CM | POA: Diagnosis present

## 2018-04-23 DIAGNOSIS — Z931 Gastrostomy status: Secondary | ICD-10-CM | POA: Diagnosis not present

## 2018-04-23 DIAGNOSIS — M810 Age-related osteoporosis without current pathological fracture: Secondary | ICD-10-CM | POA: Diagnosis present

## 2018-04-23 DIAGNOSIS — Z79891 Long term (current) use of opiate analgesic: Secondary | ICD-10-CM | POA: Diagnosis not present

## 2018-04-23 DIAGNOSIS — M1990 Unspecified osteoarthritis, unspecified site: Secondary | ICD-10-CM | POA: Diagnosis present

## 2018-04-23 DIAGNOSIS — Z853 Personal history of malignant neoplasm of breast: Secondary | ICD-10-CM | POA: Diagnosis not present

## 2018-04-23 DIAGNOSIS — K228 Other specified diseases of esophagus: Secondary | ICD-10-CM | POA: Diagnosis present

## 2018-04-23 DIAGNOSIS — G894 Chronic pain syndrome: Secondary | ICD-10-CM | POA: Diagnosis present

## 2018-04-23 DIAGNOSIS — L03221 Cellulitis of neck: Secondary | ICD-10-CM | POA: Diagnosis present

## 2018-04-23 DIAGNOSIS — Z7951 Long term (current) use of inhaled steroids: Secondary | ICD-10-CM | POA: Diagnosis not present

## 2018-04-23 DIAGNOSIS — Z79899 Other long term (current) drug therapy: Secondary | ICD-10-CM | POA: Diagnosis not present

## 2018-04-23 DIAGNOSIS — Z9861 Coronary angioplasty status: Secondary | ICD-10-CM | POA: Diagnosis not present

## 2018-04-23 DIAGNOSIS — I252 Old myocardial infarction: Secondary | ICD-10-CM | POA: Diagnosis not present

## 2018-04-23 DIAGNOSIS — R1314 Dysphagia, pharyngoesophageal phase: Secondary | ICD-10-CM | POA: Diagnosis present

## 2018-04-23 DIAGNOSIS — J449 Chronic obstructive pulmonary disease, unspecified: Secondary | ICD-10-CM | POA: Diagnosis present

## 2018-04-23 DIAGNOSIS — I4891 Unspecified atrial fibrillation: Secondary | ICD-10-CM | POA: Diagnosis present

## 2018-04-23 DIAGNOSIS — E785 Hyperlipidemia, unspecified: Secondary | ICD-10-CM | POA: Diagnosis present

## 2018-04-23 DIAGNOSIS — R918 Other nonspecific abnormal finding of lung field: Secondary | ICD-10-CM | POA: Diagnosis not present

## 2018-04-23 NOTE — Telephone Encounter (Signed)
[name] called to give Dr Baxter Flattery an update.  She may not be at her appointment 8/12. Her incision has turned red yesterday and is now swollen ("looks like a pair of lips").  She is awaiting instructions from Dr Waynard Edwards office this morning. She was also calling to ask about her diflucan prescription, stating [facility] said they did not have refills available. RN called pharmacy, they have the prescription and will fill it for pick up today. Landis Gandy, RN

## 2018-04-26 ENCOUNTER — Ambulatory Visit: Payer: Medicare Other | Admitting: Internal Medicine

## 2018-04-27 MED ORDER — ONDANSETRON HCL 4 MG/2ML IJ SOLN
4.00 | INTRAMUSCULAR | Status: DC
Start: ? — End: 2018-04-27

## 2018-04-27 MED ORDER — POLYETHYLENE GLYCOL 3350 17 G PO PACK
17.00 g | PACK | ORAL | Status: DC
Start: 2018-04-28 — End: 2018-04-27

## 2018-04-27 MED ORDER — VANCOMYCIN HCL IN DEXTROSE 1-5 GM/200ML-% IV SOLN
1.00 g | INTRAVENOUS | Status: DC
Start: 2018-04-28 — End: 2018-04-27

## 2018-04-27 MED ORDER — GENERIC EXTERNAL MEDICATION
100.00 | Status: DC
Start: 2018-04-27 — End: 2018-04-27

## 2018-04-27 MED ORDER — ALPRAZOLAM 0.25 MG PO TABS
.25 | ORAL_TABLET | ORAL | Status: DC
Start: ? — End: 2018-04-27

## 2018-04-27 MED ORDER — GENERIC EXTERNAL MEDICATION
2.00 | Status: DC
Start: 2018-04-27 — End: 2018-04-27

## 2018-04-27 MED ORDER — OXYCODONE HCL 5 MG/5ML PO SOLN
10.00 | ORAL | Status: DC
Start: ? — End: 2018-04-27

## 2018-04-27 MED ORDER — LACTATED RINGERS IV SOLN
INTRAVENOUS | Status: DC
Start: ? — End: 2018-04-27

## 2018-04-27 MED ORDER — GENERIC EXTERNAL MEDICATION
3.38 g | Status: DC
Start: 2018-04-28 — End: 2018-04-27

## 2018-04-27 MED ORDER — GENERIC EXTERNAL MEDICATION
Status: DC
Start: 2018-04-27 — End: 2018-04-27

## 2018-04-27 MED ORDER — ENOXAPARIN SODIUM 40 MG/0.4ML ~~LOC~~ SOLN
40.00 | SUBCUTANEOUS | Status: DC
Start: 2018-04-28 — End: 2018-04-27

## 2018-04-27 MED ORDER — GABAPENTIN 600 MG PO TABS
600.00 | ORAL_TABLET | ORAL | Status: DC
Start: 2018-04-27 — End: 2018-04-27

## 2018-04-27 MED ORDER — GENERIC EXTERNAL MEDICATION
825.00 | Status: DC
Start: 2018-04-27 — End: 2018-04-27

## 2018-04-27 MED ORDER — MORPHINE SULFATE 15 MG PO TABS
30.00 | ORAL_TABLET | ORAL | Status: DC
Start: 2018-04-27 — End: 2018-04-27

## 2018-04-27 MED ORDER — ALBUTEROL SULFATE HFA 108 (90 BASE) MCG/ACT IN AERS
2.00 | INHALATION_SPRAY | RESPIRATORY_TRACT | Status: DC
Start: ? — End: 2018-04-27

## 2018-04-27 MED ORDER — GENERIC EXTERNAL MEDICATION
4.00 g | Status: DC
Start: 2018-04-27 — End: 2018-04-27

## 2018-04-28 DIAGNOSIS — I4891 Unspecified atrial fibrillation: Secondary | ICD-10-CM | POA: Diagnosis not present

## 2018-04-28 DIAGNOSIS — L03221 Cellulitis of neck: Secondary | ICD-10-CM | POA: Diagnosis not present

## 2018-04-28 DIAGNOSIS — E785 Hyperlipidemia, unspecified: Secondary | ICD-10-CM | POA: Diagnosis not present

## 2018-04-28 DIAGNOSIS — I2510 Atherosclerotic heart disease of native coronary artery without angina pectoris: Secondary | ICD-10-CM | POA: Diagnosis not present

## 2018-04-28 DIAGNOSIS — Z931 Gastrostomy status: Secondary | ICD-10-CM | POA: Diagnosis not present

## 2018-04-28 DIAGNOSIS — Z452 Encounter for adjustment and management of vascular access device: Secondary | ICD-10-CM | POA: Diagnosis not present

## 2018-04-28 DIAGNOSIS — L0211 Cutaneous abscess of neck: Secondary | ICD-10-CM | POA: Diagnosis not present

## 2018-04-28 DIAGNOSIS — K228 Other specified diseases of esophagus: Secondary | ICD-10-CM | POA: Diagnosis not present

## 2018-04-28 DIAGNOSIS — I10 Essential (primary) hypertension: Secondary | ICD-10-CM | POA: Diagnosis not present

## 2018-04-28 DIAGNOSIS — E119 Type 2 diabetes mellitus without complications: Secondary | ICD-10-CM | POA: Diagnosis not present

## 2018-04-28 DIAGNOSIS — Z7951 Long term (current) use of inhaled steroids: Secondary | ICD-10-CM | POA: Diagnosis not present

## 2018-04-28 DIAGNOSIS — Z87891 Personal history of nicotine dependence: Secondary | ICD-10-CM | POA: Diagnosis not present

## 2018-04-28 DIAGNOSIS — R1314 Dysphagia, pharyngoesophageal phase: Secondary | ICD-10-CM | POA: Diagnosis not present

## 2018-04-28 DIAGNOSIS — Z79891 Long term (current) use of opiate analgesic: Secondary | ICD-10-CM | POA: Diagnosis not present

## 2018-04-29 DIAGNOSIS — R1314 Dysphagia, pharyngoesophageal phase: Secondary | ICD-10-CM | POA: Diagnosis not present

## 2018-04-29 DIAGNOSIS — I2510 Atherosclerotic heart disease of native coronary artery without angina pectoris: Secondary | ICD-10-CM | POA: Diagnosis not present

## 2018-04-29 DIAGNOSIS — E119 Type 2 diabetes mellitus without complications: Secondary | ICD-10-CM | POA: Diagnosis not present

## 2018-04-29 DIAGNOSIS — L03221 Cellulitis of neck: Secondary | ICD-10-CM | POA: Diagnosis not present

## 2018-04-29 DIAGNOSIS — K228 Other specified diseases of esophagus: Secondary | ICD-10-CM | POA: Diagnosis not present

## 2018-04-29 DIAGNOSIS — L0211 Cutaneous abscess of neck: Secondary | ICD-10-CM | POA: Diagnosis not present

## 2018-04-30 ENCOUNTER — Inpatient Hospital Stay: Payer: Medicare Other | Admitting: Internal Medicine

## 2018-04-30 DIAGNOSIS — R1314 Dysphagia, pharyngoesophageal phase: Secondary | ICD-10-CM | POA: Diagnosis not present

## 2018-04-30 DIAGNOSIS — L03221 Cellulitis of neck: Secondary | ICD-10-CM | POA: Diagnosis not present

## 2018-04-30 DIAGNOSIS — I2510 Atherosclerotic heart disease of native coronary artery without angina pectoris: Secondary | ICD-10-CM | POA: Diagnosis not present

## 2018-04-30 DIAGNOSIS — L0211 Cutaneous abscess of neck: Secondary | ICD-10-CM | POA: Diagnosis not present

## 2018-04-30 DIAGNOSIS — E119 Type 2 diabetes mellitus without complications: Secondary | ICD-10-CM | POA: Diagnosis not present

## 2018-04-30 DIAGNOSIS — K228 Other specified diseases of esophagus: Secondary | ICD-10-CM | POA: Diagnosis not present

## 2018-05-03 DIAGNOSIS — T8142XD Infection following a procedure, deep incisional surgical site, subsequent encounter: Secondary | ICD-10-CM | POA: Diagnosis not present

## 2018-05-03 DIAGNOSIS — K225 Diverticulum of esophagus, acquired: Secondary | ICD-10-CM | POA: Diagnosis not present

## 2018-05-03 DIAGNOSIS — K228 Other specified diseases of esophagus: Secondary | ICD-10-CM | POA: Diagnosis not present

## 2018-05-03 DIAGNOSIS — Z48817 Encounter for surgical aftercare following surgery on the skin and subcutaneous tissue: Secondary | ICD-10-CM | POA: Diagnosis not present

## 2018-05-03 DIAGNOSIS — Z981 Arthrodesis status: Secondary | ICD-10-CM | POA: Diagnosis not present

## 2018-05-07 ENCOUNTER — Ambulatory Visit (INDEPENDENT_AMBULATORY_CARE_PROVIDER_SITE_OTHER): Payer: Medicare Other | Admitting: Internal Medicine

## 2018-05-07 VITALS — BP 135/87 | HR 90 | Temp 98.4°F | Wt 120.0 lb

## 2018-05-07 DIAGNOSIS — L0211 Cutaneous abscess of neck: Secondary | ICD-10-CM

## 2018-05-07 MED ORDER — LINEZOLID 600 MG PO TABS: 600.0000 mg | ORAL_TABLET | Freq: Two times a day (BID) | ORAL | 0 refills | Status: DC

## 2018-05-07 NOTE — Progress Notes (Signed)
RFV: esophageal fistula/occ neck abscess   Patient ID: [name], female   DOB: 1946/08/29, 72 y.o.   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 Her fistula has been slow to heal requiring debridement of lung abscess/ neck abscess in the past for which she had been on chronic suppression with amox, bactrim, fluconazole. Most recently has been struggling to have esophageal fistula/via pinpoint leak to heal. She recently had developed neck abscess thought to be due to self advancing to foods too quickly. She eventually required I  xD of left neck abscess on 8/9- that still has serous drainage. cx at that time grew enterobacter S cipro and Vanco S e.faecium. She required iv abtx then transitioned to cipro.she last saw dr Vicie Mutters in mid august who plans to do free flap repair in early fall.     She previously had been on chronic suppression with bactrim, amox, metronidazole and fluc -- now cipro added.   Outpatient Encounter Medications as of 05/07/2018  Medication Sig  . albuterol (PROVENTIL HFA;VENTOLIN HFA) 108 (90 BASE) MCG/ACT inhaler Inhale 2 puffs into the lungs every 6 (six) hours as needed for wheezing.   Marland Kitchen ALPRAZolam (XANAX) 0.25 MG tablet Take one tablet daily or as needed  . budesonide-formoterol (SYMBICORT) 160-4.5 MCG/ACT inhaler Inhale 2 puffs into the lungs 2 (two) times daily as needed (Wheezing). Reported on 10/16/2015  . cholestyramine light (PREVALITE) 4 g packet Take 1 packet (4 g total) by mouth every 12 (twelve) hours.  . ciprofloxacin (CIPRO) 500 MG tablet Take 500 mg by mouth 2 (two) times daily.  . diclofenac (VOLTAREN) 75 MG EC tablet Take 75 mg by mouth daily as needed for moderate pain.   . fluconazole (DIFLUCAN) 200 MG tablet Take 3 tablets (600 mg total) by mouth daily.  Marland Kitchen gabapentin (NEURONTIN) 600 MG tablet Take 600 mg by mouth 4 (four) times daily.    Marland Kitchen losartan (COZAAR) 25 MG tablet Take 25 mg by mouth daily.   . metroNIDAZOLE (FLAGYL) 500 MG tablet Take 1 tablet (500 mg total) by mouth 3 (three) times daily.  Marland Kitchen morphine (MS CONTIN) 30 MG 12 hr tablet Take 30 mg by mouth 4 (four) times daily.   Marland Kitchen oxyCODONE (ROXICODONE) 15 MG immediate release tablet Take 15 mg by mouth 4 (four) times daily.  Marland Kitchen sulfamethoxazole-trimethoprim (BACTRIM DS,SEPTRA DS) 800-160 MG tablet Take 1 tablet by mouth 2 (two) times daily.   No facility-administered encounter medications on file as of 05/07/2018.      Patient Active Problem List   Diagnosis Date Noted  . Pressure injury of skin 09/22/2017  . Chronic pain 09/20/2017  . Abscess of upper lobe of left lung with pneumonia ([place])   . Tobacco consumption 02/12/2016  . Hoarseness of voice 01/07/2016  . 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 Infected wound, initial encounter 06/20/2015  . Cholecystitis   . Esophageal fistula   . Abscess   . Discitis   . HCAP (healthcare-associated pneumonia)   . 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[place]) 04/28/2015  . Abscess of neck   . Esophageal perforation   . Loss of weight   . Esophageal dysphagia   . 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 Hip dislocation, right ([place]) 04/26/2015  . Healthcare-associated pneumonia 04/26/2015  . Cholecystitis, acute 04/26/2015  . 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[place])  RFV: esophageal fistula/occ neck abscess   Patient ID: [name], female   DOB: 1946/08/29, 72 y.o.   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 Her fistula has been slow to heal requiring debridement of lung abscess/ neck abscess in the past for which she had been on chronic suppression with amox, bactrim, fluconazole. Most recently has been struggling to have esophageal fistula/via pinpoint leak to heal. She recently had developed neck abscess thought to be due to self advancing to foods too quickly. She eventually required I  xD of left neck abscess on 8/9- that still has serous drainage. cx at that time grew enterobacter S cipro and Vanco S e.faecium. She required iv abtx then transitioned to cipro.she last saw dr Vicie Mutters in mid august who plans to do free flap repair in early fall.     She previously had been on chronic suppression with bactrim, amox, metronidazole and fluc -- now cipro added.   Outpatient Encounter Medications as of 05/07/2018  Medication Sig  . albuterol (PROVENTIL HFA;VENTOLIN HFA) 108 (90 BASE) MCG/ACT inhaler Inhale 2 puffs into the lungs every 6 (six) hours as needed for wheezing.   Marland Kitchen ALPRAZolam (XANAX) 0.25 MG tablet Take one tablet daily or as needed  . budesonide-formoterol (SYMBICORT) 160-4.5 MCG/ACT inhaler Inhale 2 puffs into the lungs 2 (two) times daily as needed (Wheezing). Reported on 10/16/2015  . cholestyramine light (PREVALITE) 4 g packet Take 1 packet (4 g total) by mouth every 12 (twelve) hours.  . ciprofloxacin (CIPRO) 500 MG tablet Take 500 mg by mouth 2 (two) times daily.  . diclofenac (VOLTAREN) 75 MG EC tablet Take 75 mg by mouth daily as needed for moderate pain.   . fluconazole (DIFLUCAN) 200 MG tablet Take 3 tablets (600 mg total) by mouth daily.  Marland Kitchen gabapentin (NEURONTIN) 600 MG tablet Take 600 mg by mouth 4 (four) times daily.    Marland Kitchen losartan (COZAAR) 25 MG tablet Take 25 mg by mouth daily.   . metroNIDAZOLE (FLAGYL) 500 MG tablet Take 1 tablet (500 mg total) by mouth 3 (three) times daily.  Marland Kitchen morphine (MS CONTIN) 30 MG 12 hr tablet Take 30 mg by mouth 4 (four) times daily.   Marland Kitchen oxyCODONE (ROXICODONE) 15 MG immediate release tablet Take 15 mg by mouth 4 (four) times daily.  Marland Kitchen sulfamethoxazole-trimethoprim (BACTRIM DS,SEPTRA DS) 800-160 MG tablet Take 1 tablet by mouth 2 (two) times daily.   No facility-administered encounter medications on file as of 05/07/2018.      Patient Active Problem List   Diagnosis Date Noted  . Pressure injury of skin 09/22/2017  . Chronic pain 09/20/2017  . Abscess of upper lobe of left lung with pneumonia ([place])   . Tobacco consumption 02/12/2016  . Hoarseness of voice 01/07/2016  . 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 Infected wound, initial encounter 06/20/2015  . Cholecystitis   . Esophageal fistula   . Abscess   . Discitis   . HCAP (healthcare-associated pneumonia)   . 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[place]) 04/28/2015  . Abscess of neck   . Esophageal perforation   . Loss of weight   . Esophageal dysphagia   . 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 Hip dislocation, right ([place]) 04/26/2015  . Healthcare-associated pneumonia 04/26/2015  . Cholecystitis, acute 04/26/2015  . 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[place])  04/26/2015  . PAF (paroxysmal atrial fibrillation) ([place]) 09/20/2014  . Elevated troponin 09/20/2014  . Acute renal failure syndrome ([place])   . Malnutrition of moderate degree ([place]) 08/29/2014  . Pneumonia   . Respiratory failure ([place])   . UTI (urinary tract infection) 08/27/2014  . Sepsis, unspecified organism ([place]) 08/27/2014  . Atrial fibrillation with RVR ([place]) 08/27/2014  . Shock circulatory ([place]) 08/27/2014  . Acute kidney injury ([place]) 08/27/2014  . Encephalopathy acute 08/27/2014  . Hypokalemia 08/26/2014    . Hyponatremia 08/26/2014  . Acute renal failure ([place]) 08/26/2014  . Weakness 08/26/2014  . ETOH abuse 08/07/2014  . Edema extremities 07/12/2014  . Atelectasis 08/16/2011  . Diabetes mellitus type 2, insulin dependent ([place])   . COPD (chronic obstructive pulmonary disease) ([place])   . Tobacco dependence   . Hyperlipidemia   . Essential hypertension   . Coronary artery disease      Health Maintenance Due  Topic Date Due  . Hepatitis C Screening  [birth year]  . FOOT EXAM  02/09/1958  . OPHTHALMOLOGY EXAM  02/09/1958  . COLONOSCOPY  02/09/1998  . DEXA SCAN  02/09/2013  . PNA vac Low Risk Adult (2 of 2 - PPSV23) 02/09/2013  . MAMMOGRAM  10/10/2016  . HEMOGLOBIN A1C  05/10/2017  . INFLUENZA VACCINE  04/15/2018     Review of Systems  Physical Exam   Wt 120 lb (54.4 kg)   BMI 21.95 kg/m    No results found for: CD4TCELL No results found for: CD4TABS No results found for: HIV1RNAQUANT No results found for: HEPBSAB No results found for: RPR, LABRPR  CBC Lab Results  Component Value Date   WBC 8.4 02/16/2018   RBC 4.21 02/16/2018   HGB 11.8 02/16/2018   HCT 36.0 02/16/2018   PLT 273 02/16/2018   MCV 85.5 02/16/2018   MCH 28.0 02/16/2018   MCHC 32.8 02/16/2018   RDW 14.9 02/16/2018   LYMPHSABS 1,714 02/16/2018   MONOABS 0.8 09/22/2017   EOSABS 84 02/16/2018    BMET Lab Results  Component Value Date   NA 137 02/16/2018   K 3.9 02/16/2018   CL 101 02/16/2018   CO2 26 02/16/2018   GLUCOSE 101 (H) 02/16/2018   BUN 15 02/16/2018   CREATININE 0.54 (L) 02/16/2018   CALCIUM 9.2 02/16/2018   GFRNONAA 96 02/16/2018   GFRAA 111 02/16/2018    Organism Antibiotic Method Susceptibility  Enterobacter cloacae complex Organism ID MICRO MIC SUSCEPTIBILITY    Amikacin MICRO MIC SUSCEPTIBILITY <=16: Susceptible   Amoxicillin + K Clavulanate MICRO MIC SUSCEPTIBILITY >16/8: Resistant   Ampicillin MICRO MIC SUSCEPTIBILITY >16: Resistant   Ampicillin/Sulbactam MICRO MIC  SUSCEPTIBILITY >16/8: Resistant   Aztreonam MICRO MIC SUSCEPTIBILITY >16: Resistant   Cefazolin MICRO MIC SUSCEPTIBILITY >16: Resistant   Cefepime MICRO MIC SUSCEPTIBILITY <=2: Susceptible   Cefotaxime MICRO MIC SUSCEPTIBILITY 32: Intermediate   Ceftriaxone MICRO MIC SUSCEPTIBILITY >32: Resistant   Ciprofloxacin MICRO MIC SUSCEPTIBILITY <=1: Susceptible   Ertapenem MICRO MIC SUSCEPTIBILITY 1: Intermediate   Gentamicin MICRO MIC SUSCEPTIBILITY >8: Resistant   Meropenem MICRO MIC SUSCEPTIBILITY <=1: Susceptible   Piperacillin/Tazobactam MICRO MIC SUSCEPTIBILITY 8: Susceptible   Tobramycin MICRO MIC SUSCEPTIBILITY >8: Resistant   Trimethoprim/Sulfamethoxazole MICRO MIC SUSCEPTIBILITY >2/38: Resistant  Enterococcus faecium Organism ID MICRO MIC SUSCEPTIBILITY    Ampicillin MICRO MIC SUSCEPTIBILITY >8: Resistant   Gentamicin Synergy Screen MICRO MIC SUSCEPTIBILITY <=500: Susceptible   Penicillin G MICRO MIC SUSCEPTIBILITY >8: Resistant   Streptomycin Synergy Screen MICRO MIC SUSCEPTIBILITY <=1,000:

## 2018-05-08 LAB — C-REACTIVE PROTEIN: CRP: 1.2 mg/L (ref <8.0)

## 2018-05-08 LAB — BASIC METABOLIC PANEL
BUN: 23 mg/dL (ref 7–25)
CO2: 24 mmol/L (ref 20–32)
CREATININE: 0.73 mg/dL (ref 0.60–0.93)
Calcium: 9.7 mg/dL (ref 8.6–10.4)
Chloride: 97 mmol/L — ABNORMAL LOW (ref 98–110)
Glucose, Bld: 164 mg/dL — ABNORMAL HIGH (ref 65–99)
Potassium: 4.8 mmol/L (ref 3.5–5.3)
Sodium: 133 mmol/L — ABNORMAL LOW (ref 135–146)

## 2018-05-08 LAB — CBC WITH DIFFERENTIAL/PLATELET
BASOS PCT: 0.6 %
Basophils Absolute: 42 cells/uL (ref 0–200)
Eosinophils Absolute: 91 cells/uL (ref 15–500)
Eosinophils Relative: 1.3 %
HEMATOCRIT: 40.1 % (ref 35.0–45.0)
HEMOGLOBIN: 13 g/dL (ref 11.7–15.5)
LYMPHS ABS: 1659 {cells}/uL (ref 850–3900)
MCH: 29.9 pg (ref 27.0–33.0)
MCHC: 32.4 g/dL (ref 32.0–36.0)
MCV: 92.2 fL (ref 80.0–100.0)
MPV: 11.3 fL (ref 7.5–12.5)
Monocytes Relative: 8 %
NEUTROS ABS: 4648 {cells}/uL (ref 1500–7800)
Neutrophils Relative %: 66.4 %
PLATELETS: 244 10*3/uL (ref 140–400)
RBC: 4.35 10*6/uL (ref 3.80–5.10)
RDW: 15.5 % — ABNORMAL HIGH (ref 11.0–15.0)
Total Lymphocyte: 23.7 %
WBC: 7 10*3/uL (ref 3.8–10.8)
WBCMIX: 560 {cells}/uL (ref 200–950)

## 2018-05-08 LAB — SEDIMENTATION RATE: Sed Rate: 62 mm/h — ABNORMAL HIGH (ref 0–30)

## 2018-05-11 ENCOUNTER — Other Ambulatory Visit: Payer: Self-pay | Admitting: Pharmacist

## 2018-05-11 DIAGNOSIS — M4622 Osteomyelitis of vertebra, cervical region: Secondary | ICD-10-CM

## 2018-05-11 MED ORDER — LINEZOLID 600 MG PO TABS: 600.0000 mg | ORAL_TABLET | Freq: Two times a day (BID) | ORAL | 0 refills | Status: DC

## 2018-05-13 DIAGNOSIS — G894 Chronic pain syndrome: Secondary | ICD-10-CM | POA: Diagnosis not present

## 2018-05-13 DIAGNOSIS — K9423 Gastrostomy malfunction: Secondary | ICD-10-CM | POA: Diagnosis not present

## 2018-05-13 DIAGNOSIS — Z79891 Long term (current) use of opiate analgesic: Secondary | ICD-10-CM | POA: Diagnosis not present

## 2018-05-13 DIAGNOSIS — L659 Nonscarring hair loss, unspecified: Secondary | ICD-10-CM | POA: Diagnosis not present

## 2018-05-13 DIAGNOSIS — D500 Iron deficiency anemia secondary to blood loss (chronic): Secondary | ICD-10-CM | POA: Diagnosis not present

## 2018-05-13 DIAGNOSIS — K225 Diverticulum of esophagus, acquired: Secondary | ICD-10-CM | POA: Diagnosis not present

## 2018-05-13 DIAGNOSIS — L0211 Cutaneous abscess of neck: Secondary | ICD-10-CM | POA: Diagnosis not present

## 2018-05-13 DIAGNOSIS — E1165 Type 2 diabetes mellitus with hyperglycemia: Secondary | ICD-10-CM | POA: Diagnosis not present

## 2018-05-13 DIAGNOSIS — Z79899 Other long term (current) drug therapy: Secondary | ICD-10-CM | POA: Diagnosis not present

## 2018-05-13 DIAGNOSIS — Z1211 Encounter for screening for malignant neoplasm of colon: Secondary | ICD-10-CM | POA: Diagnosis not present

## 2018-05-13 DIAGNOSIS — Z1239 Encounter for other screening for malignant neoplasm of breast: Secondary | ICD-10-CM | POA: Diagnosis not present

## 2018-05-13 DIAGNOSIS — R7303 Prediabetes: Secondary | ICD-10-CM | POA: Diagnosis not present

## 2018-05-13 DIAGNOSIS — Z9010 Acquired absence of unspecified breast and nipple: Secondary | ICD-10-CM | POA: Diagnosis not present

## 2018-05-13 DIAGNOSIS — K228 Other specified diseases of esophagus: Secondary | ICD-10-CM | POA: Diagnosis not present

## 2018-05-14 DIAGNOSIS — R1314 Dysphagia, pharyngoesophageal phase: Secondary | ICD-10-CM | POA: Diagnosis not present

## 2018-05-14 DIAGNOSIS — I2510 Atherosclerotic heart disease of native coronary artery without angina pectoris: Secondary | ICD-10-CM | POA: Diagnosis not present

## 2018-05-14 DIAGNOSIS — L03221 Cellulitis of neck: Secondary | ICD-10-CM | POA: Diagnosis not present

## 2018-05-14 DIAGNOSIS — L0211 Cutaneous abscess of neck: Secondary | ICD-10-CM | POA: Diagnosis not present

## 2018-05-14 DIAGNOSIS — K228 Other specified diseases of esophagus: Secondary | ICD-10-CM | POA: Diagnosis not present

## 2018-05-14 DIAGNOSIS — E119 Type 2 diabetes mellitus without complications: Secondary | ICD-10-CM | POA: Diagnosis not present

## 2018-05-19 DIAGNOSIS — K225 Diverticulum of esophagus, acquired: Secondary | ICD-10-CM | POA: Diagnosis not present

## 2018-05-19 DIAGNOSIS — K228 Other specified diseases of esophagus: Secondary | ICD-10-CM | POA: Diagnosis not present

## 2018-05-24 DIAGNOSIS — M7541 Impingement syndrome of right shoulder: Secondary | ICD-10-CM | POA: Diagnosis not present

## 2018-05-24 DIAGNOSIS — M7542 Impingement syndrome of left shoulder: Secondary | ICD-10-CM | POA: Insufficient documentation

## 2018-05-31 ENCOUNTER — Ambulatory Visit: Payer: Medicare Other | Admitting: Internal Medicine

## 2018-06-01 ENCOUNTER — Ambulatory Visit (INDEPENDENT_AMBULATORY_CARE_PROVIDER_SITE_OTHER): Payer: Medicare Other | Admitting: Internal Medicine

## 2018-06-01 ENCOUNTER — Encounter: Payer: Self-pay | Admitting: Internal Medicine

## 2018-06-01 VITALS — BP 130/80 | HR 66 | Temp 99.4°F | Wt 126.0 lb

## 2018-06-01 DIAGNOSIS — K228 Other specified diseases of esophagus: Secondary | ICD-10-CM | POA: Diagnosis not present

## 2018-06-01 DIAGNOSIS — Z23 Encounter for immunization: Secondary | ICD-10-CM | POA: Diagnosis not present

## 2018-06-01 DIAGNOSIS — M4622 Osteomyelitis of vertebra, cervical region: Secondary | ICD-10-CM

## 2018-06-01 DIAGNOSIS — K2289 Other specified disease of esophagus: Secondary | ICD-10-CM

## 2018-06-01 NOTE — Progress Notes (Signed)
RFV: esophageal fistula with intermittent abscess  Patient ID: [name], female   DOB: 1946/06/18, 72 y.o.   MRN: [id number]  HPI Previously on linezolid and cipro for e.cloacae and e.faecium but has finished her course. She stopped linezolid 2 days early due to so much diarrhea. Imodium taking up to 4 tabs per day, was not improving her symptoms. Has upcoming surgery on oct 8th for muscle flap. Still has evidence of fistula since having cookie bits and water through her fistula  tannish exudate- has been on amox roughly 14 days  Outpatient Encounter Medications as of 06/01/2018  Medication Sig  . albuterol (PROVENTIL HFA;VENTOLIN HFA) 108 (90 BASE) MCG/ACT inhaler Inhale 2 puffs into the lungs every 6 (six) hours as needed for wheezing.   Marland Kitchen ALPRAZolam (XANAX) 0.25 MG tablet Take one tablet daily or as needed  . budesonide-formoterol (SYMBICORT) 160-4.5 MCG/ACT inhaler Inhale 2 puffs into the lungs 2 (two) times daily as needed (Wheezing). Reported on 10/16/2015  . cholestyramine light (PREVALITE) 4 g packet Take 1 packet (4 g total) by mouth every 12 (twelve) hours.  . ciprofloxacin (CIPRO) 500 MG tablet Take 500 mg by mouth 2 (two) times daily.  . diclofenac (VOLTAREN) 75 MG EC tablet Take 75 mg by mouth daily as needed for moderate pain.   . fluconazole (DIFLUCAN) 200 MG tablet Take 3 tablets (600 mg total) by mouth daily.  Marland Kitchen gabapentin (NEURONTIN) 600 MG tablet Take 600 mg by mouth 4 (four) times daily.   Marland Kitchen linezolid (ZYVOX) 600 MG tablet Take 1 tablet (600 mg total) by mouth 2 (two) times daily.  Marland Kitchen losartan (COZAAR) 25 MG tablet Take 25 mg by mouth daily.   . metroNIDAZOLE (FLAGYL) 500 MG tablet Take 1 tablet (500 mg total) by mouth 3 (three) times daily.  Marland Kitchen morphine (MS CONTIN) 30 MG 12 hr tablet Take 30 mg by mouth 4 (four) times daily.   Marland Kitchen oxyCODONE (ROXICODONE) 15 MG immediate release tablet Take 15 mg by mouth 4 (four) times daily.  Marland Kitchen sulfamethoxazole-trimethoprim (BACTRIM  DS,SEPTRA DS) 800-160 MG tablet Take 1 tablet by mouth 2 (two) times daily.   No facility-administered encounter medications on file as of 06/01/2018.      Patient Active Problem List   Diagnosis Date Noted  . Pressure injury of skin 09/22/2017  . Chronic pain 09/20/2017  . Abscess of upper lobe of left lung with pneumonia ([facility])   . Tobacco consumption 02/12/2016  . Hoarseness of voice 01/07/2016  . 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 Infected wound, initial encounter 06/20/2015  . Cholecystitis   . Esophageal fistula   . Abscess   . Discitis   . HCAP (healthcare-associated pneumonia)   . 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[facility]) 04/28/2015  . Abscess of neck   . Esophageal perforation   . Loss of weight   . Esophageal dysphagia   . 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 Hip dislocation, right ([facility]) 04/26/2015  . Healthcare-associated pneumonia 04/26/2015  . Cholecystitis, acute 04/26/2015  . 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[facility]) 04/26/2015  . PAF (paroxysmal atrial fibrillation) ([facility]) 09/20/2014  . Elevated troponin 09/20/2014  . Acute renal failure syndrome ([facility])   . Malnutrition of moderate degree ([facility]) 08/29/2014  . Pneumonia   . Respiratory failure ([facility])   . UTI (urinary tract infection) 08/27/2014  . Sepsis, unspecified organism ([facility]) 08/27/2014  . Atrial fibrillation with RVR ([facility]) 08/27/2014  . Shock  circulatory ([facility]) 08/27/2014  . Acute kidney injury ([facility]) 08/27/2014  . Encephalopathy acute 08/27/2014  . Hypokalemia 08/26/2014  . Hyponatremia 08/26/2014  . Acute renal failure ([facility]) 08/26/2014  . Weakness 08/26/2014  . ETOH abuse 08/07/2014  . Edema extremities 07/12/2014  . Atelectasis 08/16/2011  . Diabetes mellitus type 2, insulin dependent ([facility])   . COPD (chronic obstructive pulmonary disease) ([facility])   . Tobacco dependence   . Hyperlipidemia   . Essential hypertension   . Coronary artery disease       Health Maintenance Due  Topic Date Due  . Hepatitis C Screening  10/[birth year]  . FOOT EXAM  02/09/1958  . OPHTHALMOLOGY EXAM  02/09/1958  . COLONOSCOPY  02/09/1998  . DEXA SCAN  02/09/2013  . PNA vac Low Risk Adult (2 of 2 - PPSV23) 02/09/2013  . MAMMOGRAM  10/10/2016  . HEMOGLOBIN A1C  05/10/2017  . INFLUENZA VACCINE  04/15/2018     Review of Systems  Physical Exam   BP 130/80   Pulse 66   Temp 99.4 F (37.4 C)   Wt 126 lb (57.2 kg)   BMI 23.05 kg/m   Physical Exam  Constitutional:  oriented to person, place, and time. appears chronically illand well-nourished. No distress.  HENT: [place]/AT, PERRLA, no scleral icterus Mouth/Throat: Oropharynx is clear and moist. No oropharyngeal exudate.  Cardiovascular: Normal rate, regular rhythm and normal heart sounds. Exam reveals no gallop and no friction rub.  No murmur heard. Neck: tannish exudate on dressing  Pulmonary/Chest: Effort normal and breath sounds normal. No respiratory distress.  has no wheezes.  Abdominal: Soft. Bowel sounds are normal. Peg in place.  Neurological: alert and oriented to person, place, and time.  Skin: Skin is warm and dry. No rash noted. No erythema.  Psychiatric: a normal mood and affect.  behavior is normal.   CBC Lab Results  Component Value Date   WBC 7.0 05/07/2018   RBC 4.35 05/07/2018   HGB 13.0 05/07/2018   HCT 40.1 05/07/2018   PLT 244 05/07/2018   MCV 92.2 05/07/2018   MCH 29.9 05/07/2018   MCHC 32.4 05/07/2018   RDW 15.5 (H) 05/07/2018   LYMPHSABS 1,659 05/07/2018   MONOABS 0.8 09/22/2017   EOSABS 91 05/07/2018    BMET Lab Results  Component Value Date   NA 133 (L) 05/07/2018   K 4.8 05/07/2018   CL 97 (L) 05/07/2018   CO2 24 05/07/2018   GLUCOSE 164 (H) 05/07/2018   BUN 23 05/07/2018   CREATININE 0.73 05/07/2018   CALCIUM 9.7 05/07/2018   GFRNONAA 96 02/16/2018   GFRAA 111 02/16/2018      Assessment and Plan Esophageal fistula/ abscess = Plan on treating 2  weeks prior to surgery with cipro and linezolid unless labs are much different that worse than last labs. May do longer if labs show leukocytosis. Will check cbc with sed rate and crp  Sent in rx for linezolid and cipro

## 2018-06-02 LAB — CBC WITH DIFFERENTIAL/PLATELET
BASOS ABS: 30 {cells}/uL (ref 0–200)
Basophils Relative: 0.3 %
EOS PCT: 1 %
Eosinophils Absolute: 101 cells/uL (ref 15–500)
HCT: 38.9 % (ref 35.0–45.0)
HEMOGLOBIN: 12.9 g/dL (ref 11.7–15.5)
Lymphs Abs: 2050 cells/uL (ref 850–3900)
MCH: 30.9 pg (ref 27.0–33.0)
MCHC: 33.2 g/dL (ref 32.0–36.0)
MCV: 93.1 fL (ref 80.0–100.0)
MONOS PCT: 10.7 %
MPV: 11.5 fL (ref 7.5–12.5)
NEUTROS ABS: 6838 {cells}/uL (ref 1500–7800)
Neutrophils Relative %: 67.7 %
Platelets: 203 10*3/uL (ref 140–400)
RBC: 4.18 10*6/uL (ref 3.80–5.10)
RDW: 15 % (ref 11.0–15.0)
Total Lymphocyte: 20.3 %
WBC mixed population: 1081 cells/uL — ABNORMAL HIGH (ref 200–950)
WBC: 10.1 10*3/uL (ref 3.8–10.8)

## 2018-06-02 LAB — BASIC METABOLIC PANEL
BUN/Creatinine Ratio: 43 (calc) — ABNORMAL HIGH (ref 6–22)
BUN: 23 mg/dL (ref 7–25)
CALCIUM: 9.1 mg/dL (ref 8.6–10.4)
CO2: 34 mmol/L — ABNORMAL HIGH (ref 20–32)
Chloride: 98 mmol/L (ref 98–110)
Creat: 0.54 mg/dL — ABNORMAL LOW (ref 0.60–0.93)
Glucose, Bld: 94 mg/dL (ref 65–99)
POTASSIUM: 3.9 mmol/L (ref 3.5–5.3)
Sodium: 137 mmol/L (ref 135–146)

## 2018-06-02 LAB — SEDIMENTATION RATE: SED RATE: 63 mm/h — AB (ref 0–30)

## 2018-06-02 LAB — C-REACTIVE PROTEIN: CRP: 11.8 mg/L — AB (ref <8.0)

## 2018-06-04 MED ORDER — LINEZOLID 600 MG PO TABS: 600.0000 mg | ORAL_TABLET | Freq: Two times a day (BID) | ORAL | 0 refills | Status: DC

## 2018-06-04 MED ORDER — CIPROFLOXACIN HCL 500 MG PO TABS: 500.0000 mg | ORAL_TABLET | Freq: Two times a day (BID) | ORAL | 0 refills | Status: DC

## 2018-06-07 DIAGNOSIS — J441 Chronic obstructive pulmonary disease with (acute) exacerbation: Secondary | ICD-10-CM | POA: Diagnosis not present

## 2018-06-07 DIAGNOSIS — R05 Cough: Secondary | ICD-10-CM | POA: Diagnosis not present

## 2018-06-07 DIAGNOSIS — R531 Weakness: Secondary | ICD-10-CM | POA: Diagnosis not present

## 2018-06-07 DIAGNOSIS — R9431 Abnormal electrocardiogram [ECG] [EKG]: Secondary | ICD-10-CM | POA: Diagnosis not present

## 2018-06-07 DIAGNOSIS — J449 Chronic obstructive pulmonary disease, unspecified: Secondary | ICD-10-CM | POA: Diagnosis not present

## 2018-06-07 DIAGNOSIS — F17210 Nicotine dependence, cigarettes, uncomplicated: Secondary | ICD-10-CM | POA: Diagnosis not present

## 2018-06-07 DIAGNOSIS — I493 Ventricular premature depolarization: Secondary | ICD-10-CM | POA: Diagnosis not present

## 2018-06-07 DIAGNOSIS — I517 Cardiomegaly: Secondary | ICD-10-CM | POA: Diagnosis not present

## 2018-06-07 DIAGNOSIS — R0602 Shortness of breath: Secondary | ICD-10-CM | POA: Diagnosis not present

## 2018-06-07 DIAGNOSIS — R0902 Hypoxemia: Secondary | ICD-10-CM | POA: Diagnosis not present

## 2018-06-07 DIAGNOSIS — R197 Diarrhea, unspecified: Secondary | ICD-10-CM | POA: Diagnosis not present

## 2018-06-07 DIAGNOSIS — I10 Essential (primary) hypertension: Secondary | ICD-10-CM | POA: Diagnosis not present

## 2018-06-08 DIAGNOSIS — I517 Cardiomegaly: Secondary | ICD-10-CM | POA: Diagnosis not present

## 2018-06-08 DIAGNOSIS — R9431 Abnormal electrocardiogram [ECG] [EKG]: Secondary | ICD-10-CM | POA: Diagnosis not present

## 2018-06-08 DIAGNOSIS — I493 Ventricular premature depolarization: Secondary | ICD-10-CM | POA: Diagnosis not present

## 2018-06-22 DIAGNOSIS — K228 Other specified diseases of esophagus: Secondary | ICD-10-CM | POA: Diagnosis not present

## 2018-06-22 DIAGNOSIS — J449 Chronic obstructive pulmonary disease, unspecified: Secondary | ICD-10-CM | POA: Diagnosis present

## 2018-06-22 DIAGNOSIS — E119 Type 2 diabetes mellitus without complications: Secondary | ICD-10-CM | POA: Diagnosis present

## 2018-06-22 DIAGNOSIS — K225 Diverticulum of esophagus, acquired: Secondary | ICD-10-CM | POA: Diagnosis present

## 2018-06-22 DIAGNOSIS — Z9889 Other specified postprocedural states: Secondary | ICD-10-CM | POA: Diagnosis not present

## 2018-06-22 DIAGNOSIS — G8929 Other chronic pain: Secondary | ICD-10-CM | POA: Diagnosis present

## 2018-06-22 DIAGNOSIS — R918 Other nonspecific abnormal finding of lung field: Secondary | ICD-10-CM | POA: Diagnosis not present

## 2018-06-22 DIAGNOSIS — J392 Other diseases of pharynx: Secondary | ICD-10-CM | POA: Diagnosis present

## 2018-06-22 DIAGNOSIS — I2510 Atherosclerotic heart disease of native coronary artery without angina pectoris: Secondary | ICD-10-CM | POA: Diagnosis present

## 2018-06-22 DIAGNOSIS — Z79891 Long term (current) use of opiate analgesic: Secondary | ICD-10-CM | POA: Diagnosis not present

## 2018-06-22 DIAGNOSIS — I252 Old myocardial infarction: Secondary | ICD-10-CM | POA: Diagnosis not present

## 2018-06-22 DIAGNOSIS — G8918 Other acute postprocedural pain: Secondary | ICD-10-CM | POA: Diagnosis not present

## 2018-06-22 DIAGNOSIS — M545 Low back pain: Secondary | ICD-10-CM | POA: Diagnosis not present

## 2018-06-22 DIAGNOSIS — Z9689 Presence of other specified functional implants: Secondary | ICD-10-CM | POA: Diagnosis present

## 2018-06-22 DIAGNOSIS — I10 Essential (primary) hypertension: Secondary | ICD-10-CM | POA: Diagnosis present

## 2018-06-22 DIAGNOSIS — E785 Hyperlipidemia, unspecified: Secondary | ICD-10-CM | POA: Diagnosis not present

## 2018-06-22 DIAGNOSIS — Z888 Allergy status to other drugs, medicaments and biological substances status: Secondary | ICD-10-CM | POA: Diagnosis not present

## 2018-06-22 DIAGNOSIS — K9429 Other complications of gastrostomy: Secondary | ICD-10-CM | POA: Diagnosis not present

## 2018-06-25 DIAGNOSIS — K9423 Gastrostomy malfunction: Secondary | ICD-10-CM | POA: Insufficient documentation

## 2018-06-25 MED ORDER — SODIUM CHLORIDE FLUSH 0.9 % IV SOLN
10.00 | INTRAVENOUS | Status: DC
Start: 2018-06-25 — End: 2018-06-25

## 2018-06-25 MED ORDER — ACETAMINOPHEN 325 MG PO TABS
650.00 | ORAL_TABLET | ORAL | Status: DC
Start: 2018-06-25 — End: 2018-06-25

## 2018-06-25 MED ORDER — BUPRENORPHINE HCL 2 MG SL SUBL
4.00 | SUBLINGUAL_TABLET | SUBLINGUAL | Status: DC
Start: 2018-06-25 — End: 2018-06-25

## 2018-06-25 MED ORDER — ONDANSETRON HCL 4 MG/2ML IJ SOLN
4.00 | INTRAMUSCULAR | Status: DC
Start: ? — End: 2018-06-25

## 2018-06-25 MED ORDER — ALBUTEROL SULFATE HFA 108 (90 BASE) MCG/ACT IN AERS
2.00 | INHALATION_SPRAY | RESPIRATORY_TRACT | Status: DC
Start: ? — End: 2018-06-25

## 2018-06-25 MED ORDER — BACITRACIN ZINC 500 UNIT/GM EX OINT
TOPICAL_OINTMENT | CUTANEOUS | Status: DC
Start: 2018-06-25 — End: 2018-06-25

## 2018-06-25 MED ORDER — GABAPENTIN 800 MG PO TABS
800.00 | ORAL_TABLET | ORAL | Status: DC
Start: 2018-06-25 — End: 2018-06-25

## 2018-06-25 MED ORDER — CLINDAMYCIN PHOSPHATE IN D5W 600 MG/50ML IV SOLN
600.00 | INTRAVENOUS | Status: DC
Start: 2018-06-26 — End: 2018-06-25

## 2018-06-25 MED ORDER — SODIUM CHLORIDE FLUSH 0.9 % IV SOLN
10.00 | INTRAVENOUS | Status: DC
Start: ? — End: 2018-06-25

## 2018-06-25 MED ORDER — DEXTROSE-NACL 5-0.45 % IV SOLN
INTRAVENOUS | Status: DC
Start: ? — End: 2018-06-25

## 2018-06-25 MED ORDER — ENOXAPARIN SODIUM 40 MG/0.4ML ~~LOC~~ SOLN
40.00 | SUBCUTANEOUS | Status: DC
Start: 2018-06-26 — End: 2018-06-25

## 2018-06-25 MED ORDER — CULTURELLE PO CAPS
1.00 | ORAL_CAPSULE | ORAL | Status: DC
Start: 2018-06-26 — End: 2018-06-25

## 2018-06-25 MED ORDER — BUDESONIDE-FORMOTEROL FUMARATE 160-4.5 MCG/ACT IN AERO
2.00 | INHALATION_SPRAY | RESPIRATORY_TRACT | Status: DC
Start: ? — End: 2018-06-25

## 2018-06-25 MED ORDER — GENERIC EXTERNAL MEDICATION
Status: DC
Start: 2018-06-25 — End: 2018-06-25

## 2018-06-25 MED ORDER — GENERIC EXTERNAL MEDICATION
Status: DC
Start: 2018-06-26 — End: 2018-06-25

## 2018-06-25 MED ORDER — NALOXONE HCL 0.4 MG/ML IJ SOLN
0.10 | INTRAMUSCULAR | Status: DC
Start: ? — End: 2018-06-25

## 2018-06-25 MED ORDER — OXYCODONE HCL 5 MG/5ML PO SOLN
5.00 | ORAL | Status: DC
Start: ? — End: 2018-06-25

## 2018-06-25 MED ORDER — TIZANIDINE HCL 2 MG PO TABS
2.00 | ORAL_TABLET | ORAL | Status: DC
Start: 2018-06-25 — End: 2018-06-25

## 2018-06-25 MED ORDER — CELECOXIB 200 MG PO CAPS
200.00 | ORAL_CAPSULE | ORAL | Status: DC
Start: 2018-06-25 — End: 2018-06-25

## 2018-06-25 MED ORDER — POLYETHYLENE GLYCOL 3350 17 G PO PACK
17.00 | PACK | ORAL | Status: DC
Start: 2018-06-26 — End: 2018-06-25

## 2018-06-30 ENCOUNTER — Other Ambulatory Visit: Payer: Self-pay | Admitting: Family Medicine

## 2018-06-30 DIAGNOSIS — Z1231 Encounter for screening mammogram for malignant neoplasm of breast: Secondary | ICD-10-CM

## 2018-07-12 DIAGNOSIS — K225 Diverticulum of esophagus, acquired: Secondary | ICD-10-CM | POA: Diagnosis not present

## 2018-07-12 DIAGNOSIS — Z9889 Other specified postprocedural states: Secondary | ICD-10-CM | POA: Diagnosis not present

## 2018-07-12 DIAGNOSIS — Z4802 Encounter for removal of sutures: Secondary | ICD-10-CM | POA: Diagnosis not present

## 2018-07-19 ENCOUNTER — Ambulatory Visit (INDEPENDENT_AMBULATORY_CARE_PROVIDER_SITE_OTHER): Payer: Medicare Other | Admitting: Internal Medicine

## 2018-07-19 ENCOUNTER — Encounter: Payer: Self-pay | Admitting: Internal Medicine

## 2018-07-19 VITALS — BP 126/73 | HR 70 | Temp 97.5°F | Wt 120.0 lb

## 2018-07-19 DIAGNOSIS — K2289 Other specified disease of esophagus: Secondary | ICD-10-CM

## 2018-07-19 DIAGNOSIS — R634 Abnormal weight loss: Secondary | ICD-10-CM

## 2018-07-19 DIAGNOSIS — K228 Other specified diseases of esophagus: Secondary | ICD-10-CM | POA: Diagnosis not present

## 2018-07-19 NOTE — Progress Notes (Signed)
Acute kidney injury ([place]) 08/27/2014  . Encephalopathy acute 08/27/2014  . Hypokalemia 08/26/2014  . Hyponatremia 08/26/2014  . Acute renal failure ([place]) 08/26/2014  . Weakness 08/26/2014  . ETOH abuse 08/07/2014  . Edema extremities 07/12/2014  . Atelectasis 08/16/2011  . Diabetes mellitus type 2, insulin dependent ([place])   . COPD (chronic obstructive pulmonary disease) ([place])   . Tobacco dependence   . Hyperlipidemia   . Essential hypertension   .  Coronary artery disease      Health Maintenance Due  Topic Date Due  . Hepatitis C Screening  November 06, 1945  . FOOT EXAM  02/09/1958  . OPHTHALMOLOGY EXAM  02/09/1958  . COLONOSCOPY  02/09/1998  . DEXA SCAN  02/09/2013  . PNA vac Low Risk Adult (2 of 2 - PPSV23) 02/09/2013  . MAMMOGRAM  10/10/2016  . HEMOGLOBIN A1C  05/10/2017     Review of Systems 12 point ros is negative, has still had some weight loss since last visit - down #6 lb Physical Exam   BP 126/73   Pulse 70   Temp (!) 97.5 F (36.4 C)   Wt 120 lb (54.4 kg)   BMI 21.95 kg/m   Physical Exam  Constitutional:  oriented to person, place, and time. appears under-nourished. No distress.  HENT: [place]/AT, PERRLA, no scleral icterus Mouth/Throat: Oropharynx is clear and moist. No oropharyngeal exudate. Left sided neck incision well healed Cardiovascular: Normal rate, regular rhythm and normal heart sounds. Exam reveals no gallop and no friction rub.  No murmur heard.  Pulmonary/Chest: Effort normal and breath sounds normal. No respiratory distress.  has no wheezes.  Abdominal: Soft. Bowel sounds are normal.  exhibits no distension. There is no tenderness. G tube in placed Lymphadenopathy: no cervical adenopathy. No axillary adenopathy Neurological: alert and oriented to person, place, and time.  Skin: Skin is warm and dry. No rash noted. No erythema.  Psychiatric: a normal mood and affect.  behavior is normal.   CBC Lab Results  Component Value Date   WBC 10.1 06/01/2018   RBC 4.18 06/01/2018   HGB 12.9 06/01/2018   HCT 38.9 06/01/2018   PLT 203 06/01/2018   MCV 93.1 06/01/2018   MCH 30.9 06/01/2018   MCHC 33.2 06/01/2018   RDW 15.0 06/01/2018   LYMPHSABS 2,050 06/01/2018   MONOABS 0.8 09/22/2017   EOSABS 101 06/01/2018    BMET Lab Results  Component Value Date   NA 137 06/01/2018   K 3.9 06/01/2018   CL 98 06/01/2018   CO2 34 (H) 06/01/2018   GLUCOSE 94 06/01/2018   BUN 23 06/01/2018   CREATININE 0.54  (L) 06/01/2018   CALCIUM 9.1 06/01/2018   GFRNONAA 96 02/16/2018   GFRAA 111 02/16/2018      Assessment and Plan Hx of eso fistula with previous thoracic abscess = now surgically repaired. Continue to monitor off of abtx. We will  Call at end of the week to see if she has passed her swallow study.   Weight loss = still recommend supplemental feeding even if she doesn't pass swallow study rtc 21months  RFV: follow up for esophageal fistula repair  Patient ID: [name], female   DOB: 1946-05-28, 72 y.o.   MRN: [id number]  HPI Rekita is a 72yo F with history of esophageal fistula 2/2 diverticulum associated with cervical osteo, upper thoracic abscess requiring previous evacuation and has been on broad spectrum abtx for the greater part of this past year. She underwent a FREE MUSC-SKIN FLAP W/MICROVASC ANAST FREE FLAP SCAPULARMuscle on 10/8 by Dr Vicie Mutters that is thought to be a success . Her neck incision has completely healed and Now doing better. She has not been on abtx since early October. She Has upcoming swallow exam to see if she gets green light for starting to take liquids by mouth.  No fever, chills, nightsweats, neck pain or fullness Outpatient Encounter Medications as of 07/19/2018  Medication Sig  . albuterol (PROVENTIL HFA;VENTOLIN HFA) 108 (90 BASE) MCG/ACT inhaler Inhale 2 puffs into the lungs every 6 (six) hours as needed for wheezing.   Marland Kitchen ALPRAZolam (XANAX) 0.25 MG tablet Take one tablet daily or as needed  . budesonide-formoterol (SYMBICORT) 160-4.5 MCG/ACT inhaler Inhale 2 puffs into the lungs 2 (two) times daily as needed (Wheezing). Reported on 10/16/2015  . cholestyramine light (PREVALITE) 4 g packet Take 1 packet (4 g total) by mouth every 12 (twelve) hours.  . gabapentin (NEURONTIN) 600 MG tablet Take 600 mg by mouth 4 (four) times daily.   Marland Kitchen morphine (MS CONTIN) 30 MG 12 hr tablet Take 30 mg by mouth 4 (four) times daily.   Marland Kitchen oxyCODONE (ROXICODONE) 15 MG immediate release tablet Take 15 mg by mouth 4 (four) times daily.  . [DISCONTINUED] diclofenac (VOLTAREN) 75 MG EC tablet Take 75 mg by mouth daily as needed for moderate pain.   . [DISCONTINUED] linezolid (ZYVOX) 600 MG tablet Take 1 tablet (600 mg total) by mouth 2 (two) times daily.  . [DISCONTINUED] losartan (COZAAR) 25 MG tablet Take 25 mg by mouth daily.   . [DISCONTINUED] ciprofloxacin (CIPRO) 500 MG tablet  Take 1 tablet (500 mg total) by mouth 2 (two) times daily. (Patient not taking: Reported on 07/19/2018)   No facility-administered encounter medications on file as of 07/19/2018.      Patient Active Problem List   Diagnosis Date Noted  . Pressure injury of skin 09/22/2017  . Chronic pain 09/20/2017  . Abscess of upper lobe of left lung with pneumonia ([place])   . Tobacco consumption 02/12/2016  . Hoarseness of voice 01/07/2016  . 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 Infected wound, initial encounter 06/20/2015  . Cholecystitis   . Esophageal fistula   . Abscess   . Discitis   . HCAP (healthcare-associated pneumonia)   . 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[place]) 04/28/2015  . Abscess of neck   . Esophageal perforation   . Loss of weight   . Esophageal dysphagia   . 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 Hip dislocation, right ([place]) 04/26/2015  . Healthcare-associated pneumonia 04/26/2015  . Cholecystitis, acute 04/26/2015  . 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[place]) 04/26/2015  . PAF (paroxysmal atrial fibrillation) ([place]) 09/20/2014  . Elevated troponin 09/20/2014  . Acute renal failure syndrome ([place])   . Malnutrition of moderate degree ([place]) 08/29/2014  . Pneumonia   . Respiratory failure ([place])   . UTI (urinary tract infection) 08/27/2014  . Sepsis, unspecified organism ([place]) 08/27/2014  . Atrial fibrillation with RVR ([place]) 08/27/2014  . Shock circulatory ([place]) 08/27/2014  .

## 2018-07-21 ENCOUNTER — Inpatient Hospital Stay: Payer: Medicare Other | Admitting: Internal Medicine

## 2018-07-22 DIAGNOSIS — K225 Diverticulum of esophagus, acquired: Secondary | ICD-10-CM | POA: Diagnosis not present

## 2018-07-22 DIAGNOSIS — Z431 Encounter for attention to gastrostomy: Secondary | ICD-10-CM | POA: Diagnosis not present

## 2018-07-22 DIAGNOSIS — K228 Other specified diseases of esophagus: Secondary | ICD-10-CM | POA: Diagnosis not present

## 2018-07-22 DIAGNOSIS — K9423 Gastrostomy malfunction: Secondary | ICD-10-CM | POA: Diagnosis not present

## 2018-08-03 ENCOUNTER — Ambulatory Visit
Admission: RE | Admit: 2018-08-03 | Discharge: 2018-08-03 | Disposition: A | Discharge status: Home / Self Care | Payer: Medicare Other | Source: Ambulatory Visit | Attending: Family Medicine | Admitting: Family Medicine

## 2018-08-03 DIAGNOSIS — Z1231 Encounter for screening mammogram for malignant neoplasm of breast: Secondary | ICD-10-CM | POA: Diagnosis not present

## 2018-08-09 DIAGNOSIS — E1151 Type 2 diabetes mellitus with diabetic peripheral angiopathy without gangrene: Secondary | ICD-10-CM | POA: Diagnosis not present

## 2018-08-09 DIAGNOSIS — K225 Diverticulum of esophagus, acquired: Secondary | ICD-10-CM | POA: Diagnosis not present

## 2018-08-09 DIAGNOSIS — K9423 Gastrostomy malfunction: Secondary | ICD-10-CM | POA: Diagnosis not present

## 2018-08-09 DIAGNOSIS — R600 Localized edema: Secondary | ICD-10-CM | POA: Diagnosis not present

## 2018-08-09 DIAGNOSIS — D500 Iron deficiency anemia secondary to blood loss (chronic): Secondary | ICD-10-CM | POA: Diagnosis not present

## 2018-08-09 DIAGNOSIS — K228 Other specified diseases of esophagus: Secondary | ICD-10-CM | POA: Diagnosis not present

## 2018-08-25 ENCOUNTER — Encounter: Payer: Self-pay | Admitting: Cardiology

## 2018-09-13 DIAGNOSIS — Z5181 Encounter for therapeutic drug level monitoring: Secondary | ICD-10-CM | POA: Diagnosis not present

## 2018-09-13 DIAGNOSIS — Z79899 Other long term (current) drug therapy: Secondary | ICD-10-CM | POA: Diagnosis not present

## 2018-09-13 DIAGNOSIS — G894 Chronic pain syndrome: Secondary | ICD-10-CM | POA: Diagnosis not present

## 2018-09-17 NOTE — Progress Notes (Signed)
09/20/2018   PCP: Vernie Shanks, MD   Chief Complaint  Patient presents with  . Edema    Primary Cardiologist:Dr. P. Martinique    HPI:  [name] is seen for follow up CAD. She has a history of coronary disease and has had multiple angioplasty procedures of the first obtuse marginal vessel. She is status post stenting of the right coronary using a 3.0 x 18 mm Tetra stent in 2000. Her last cardiac catheterization in July of 2010 showed nonobstructive disease. Her myoview study in 07/2014 was normal. She has a history of tobacco and Etoh abuse. She has chronic depression. Her last Echo 08/2014 with EF 60-65% moderate MR, PA peak pressure 39 mm Hg overall poor quality.  She was hospitalized  in Dec. 2015 for sepsis due to PNA and UTI.  During this acute episode she had rapid atrial fib and mild elevation of troponin at 1.20.  Prior to discharge she was in SR, it was felt no anticoagulation was warranted for brief episode of PAF with acute illness.  Her troponin elevation was felt to be due to demand ischemia. myoview in November 2015 was normal.   She later dislocated her hip. She was having progressive swallowing problems. She was diagnosed with cervical discitis with spinal canal stenosis. She was found to have an epidural abscess extending into the mediastinum and had an esophageal perforation. This required surgical exploration with removal of hardware. She lost a significant amount of weight with all this. In February 2017 she did undergo successful revision of her hip.   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 She had a feeding gastrostomy tube placed.  This was managed with chronic antibiotics.   She was admitted in January 2019 with recurrent abscess extending into the left mediastinum. On 09/28/2017 she underwent a left video-assisted thoracoscopic exploration,  thoracoscopic pneumolysis, thoracoscopic drainage of posterior mediastinal abscess and fistulous tract and a thoracoscopic wedge resection/sublobar resection of an apical posterior portion of her left upper lobe for an abscess that all resulted from a leaking Zenker's diverticulectomy. 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 Vicie Mutters at [facility] on 11/26/17 that supported the fistula site and obliterated the pathway into mediastinum. Readmitted in June with neck swelling. Swallowing evaluation revealed  focal outpouching arising from the posterior hypopharynx consistent with a Zenker's diverticulum, which is similar in size compared to prior exam.  New fistulous tract projecting superiorly from the diverticulum within the retropharyngeal/prevertebral soft tissue with a possible connection back into the posterior pharynx. In October she was admitted and underwent on 06/22/18 Open excision of fistula tract and repair of Zenker's diverticulum using the contour curved cutter stapler (Ethicon); sternocleidomastoid flap. Esophagram in November showed normal no diverticulum or leak.    With all these procedures she lost down to 110 lbs. She is now eating again and has gained 37 lbs. She notes more swelling in her legs now. This has been an issue in the past managed with compression hose. With her dramatic weight loss this resolved but has recurred now. She does eat a lot of salt. She is not smoking. Denies any chest pain, dyspnea. She is on lasix daily but doesn't know dose.     Allergies  Allergen Reactions  . Cephalexin Other (See Comments)    Took off first layer of skin inside of mouth  . Zithromax [Azithromycin Dihydrate] Other (See Comments)    ORAL ULCERS     Current Outpatient Medications  Medication Sig Dispense Refill  . albuterol  (PROVENTIL HFA;VENTOLIN HFA) 108 (90 BASE) MCG/ACT inhaler Inhale 2 puffs into the lungs every 6 (six) hours as needed for wheezing.     Marland Kitchen ALPRAZolam (XANAX) 0.25 MG tablet Take one tablet daily or as needed 90 tablet 3  . budesonide-formoterol (SYMBICORT) 160-4.5 MCG/ACT inhaler Inhale 2 puffs into the lungs 2 (two) times daily as needed (Wheezing). Reported on 10/16/2015    . cholestyramine light (PREVALITE) 4 g packet Take 1 packet (4 g total) by mouth every 12 (twelve) hours.    . gabapentin (NEURONTIN) 600 MG tablet Take 600 mg by mouth 4 (four) times daily.     Marland Kitchen morphine (MS CONTIN) 30 MG 12 hr tablet Take 30 mg by mouth 4 (four) times daily.     Marland Kitchen oxyCODONE (ROXICODONE) 15 MG immediate release tablet Take 15 mg by mouth 4 (four) times daily.    . furosemide (LASIX) 20 MG tablet Take 1 tablet (20 mg total) by mouth daily. 90 tablet 3  . nitroGLYCERIN (NITROSTAT) 0.4 MG SL tablet Place 1 tablet (0.4 mg total) under the tongue every 5 (five) minutes as needed for chest pain. 90 tablet 3   No current facility-administered medications for this visit.     Past Medical History:  Diagnosis Date  . Asthma   . Breast cancer ([place])    right breast  . COPD (chronic obstructive pulmonary disease) ([place])   . Coronary artery disease   . Diverticulum of esophagus   . Elevated LFTs   . Emphysema lung ([place])   . ETOH abuse   . H/O atrial fibrillation without current medication    only one time when she had sepsis  . Hyperlipidemia   . Hypertension    hx of but not on any medications  . Myocardial infarction ([place]) 2000  . OA (osteoarthritis) of knee   . Osteoarthritis   . Tobacco abuse     Past Surgical History:  Procedure Laterality Date  . ANTERIOR HIP REVISION Right 10/22/2015   Procedure: RIGHT  HIP REVISION;  Surgeon: Paralee Cancel, MD;  Location: [facility];  Service: Orthopedics;  Laterality: Right;  . APPLICATION OF WOUND VAC N/A 06/20/2015   Procedure: APPLICATION OF INCISIONAL WOUND VAC;   Surgeon: Melina Schools, MD;  Location: [place];  Service: Orthopedics;  Laterality: N/A;  . BREAST SURGERY  1991   right mastectomy  . CARDIAC CATHETERIZATION  04/05/2009   EF 60%  . CARDIOVASCULAR STRESS TEST  01/31/2005   EF 58%  . CESAREAN SECTION  '78, '80, '81   x 3  . CORONARY ANGIOPLASTY  08/1998   x2 OF A BIFURCATION OM-1, OM-2 LESION  . CORONARY ANGIOPLASTY WITH STENT PLACEMENT  01/1999   MID FIRST OBTUSE MARGINAL VESSEL  . CORONARY ANGIOPLASTY WITH STENT PLACEMENT  07/1999   STENTING AT THE CRUX OF THE RIGHT CORONARY ARTERY WITH A 3.8MM X 18MM TETRA STENT  . DIRECT LARYNGOSCOPY N/A 05/03/2015   Procedure: DIRECT LARYNGOSCOPY;  Surgeon: Jodi Marble, MD;  Location: [place];  Service: ENT;  Laterality: N/A;  . EYE SURGERY  05/18/2014,06/01/2014   BILATERAL CATARACT S WITH LENS IMPLANTS  . GASTROSTOMY N/A 05/04/2015   Procedure: OPEN GASTROSTOMY WITH TUBE PLACEMENT;  Surgeon: Donnie Mesa, MD;  Location: [place];  Service: General;  Laterality: N/A;  . GASTROSTOMY N/A 11/13/2016   Procedure: INSERTION OF GASTROSTOMY TUBE;  Surgeon: Coralie Keens, MD;  Location: [place];  Service: General;  Laterality: N/A;  . HARDWARE REMOVAL N/A 05/03/2015   Procedure: HARDWARE REMOVAL;  Surgeon: Melina Schools, MD;  Location: [place];  Service: Orthopedics;  Laterality: N/A;  . HIP CLOSED REDUCTION Right 04/26/2015   Procedure: CLOSED REDUCTION HIP;  Surgeon: Melina Schools, MD;  Location: [facility];  Service: Orthopedics;  Laterality: Right;  . HYSTEROSCOPY     D & C  . INCISION AND DRAINAGE ABSCESS N/A 05/03/2015   Procedure: INCISION AND DRAINAGE CERVICAL  ABSCESS AND REMOVAL OF HARDWARE;  Surgeon: Melina Schools, MD;  Location: [place];  Service: Orthopedics;  Laterality: N/A;  . IR CM INJ ANY COLONIC TUBE W/FLUORO  10/14/2017  . IR CM INJ ANY COLONIC TUBE W/FLUORO  10/23/2017  . IR CM INJ ANY COLONIC TUBE W/FLUORO  10/28/2017  . IR [place] PERCUT W/FLUORO  09/22/2017  . JOINT REPLACEMENT  08/2011    bilateral hip  . JOINT REPLACEMENT  01/2012   right hip  . MASTECTOMY    . neck fusion  2011  . PELVIC LAPAROSCOPY  2002   RSO-    . RADICAL NECK DISSECTION N/A 11/08/2016   Procedure: INCISION AND DRAINAGE OF NECK ABSCESS;  Surgeon: Jerrell Belfast, MD;  Location: [place];  Service: ENT;  Laterality: N/A;  . RADIOLOGY WITH ANESTHESIA Right 06/28/2015   Procedure: MRI OF CERVICAL SPINE  AND RIGHT HIP  WITH AND WITHOUT CONTRAST    (RADIOLOGY WITH ANESTHESIA);  Surgeon: Medication Radiologist, MD;  Location: [place];  Service: Radiology;  Laterality: Right;  . REMOVAL OF GASTROSTOMY TUBE N/A 11/14/2016   Procedure: REMOVAL OF GASTROSTOMY TUBE W/ REPLACEMENT OF GASTROSTOMY TUBE;  Surgeon: Coralie Keens, MD;  Location: [place];  Service: General;  Laterality: N/A;  . RIGID ESOPHAGOSCOPY N/A 05/03/2015   Procedure: RIGID ESOPHAGOSCOPY;  Surgeon: Jodi Marble, MD;  Location: [place];  Service: ENT;  Laterality: N/A;  . TONSILLECTOMY AND ADENOIDECTOMY    . TOTAL HIP ARTHROPLASTY  08/2010   bilat  . VULVECTOMY  1981   partial    ROS:As noted in HPI. All other systems are reviewed and are negative.   Wt Readings from Last 3 Encounters:  09/20/18 147 lb 9.6 oz (67 kg)  07/19/18 120 lb (54.4 kg)  06/01/18 126 lb (57.2 kg)    PHYSICAL EXAM BP 120/70   Pulse 77   Ht 5\' 2"  (1.575 m)   Wt 147 lb 9.6 oz (67 kg)   BMI 27.00 kg/m  GENERAL:  Well appearing WF in NAD HEENT:  PERRL, EOMI, sclera are clear. Oropharynx is clear. NECK:  No jugular venous distention, carotid upstroke brisk and symmetric, no bruits, no thyromegaly or adenopathy LUNGS:  Clear to auscultation bilaterally CHEST:  Unremarkable HEART:  RRR,  PMI not displaced or sustained,S1 and S2 within normal limits, no S3, no S4: no clicks, no rubs, no murmurs ABD:  Soft, nontender. Gastrostomy in place.  EXT:  2 + pulses throughout, 2+ edema, no cyanosis no clubbing SKIN:  Warm and dry.  No rashes NEURO:  Alert and oriented x 3.  Cranial nerves II through XII intact. PSYCH:  Cognitively intact      Laboratory data:  Lab Results  Component Value Date   WBC 10.1 06/01/2018   HGB 12.9  06/01/2018   HCT 38.9 06/01/2018   PLT 203 06/01/2018   GLUCOSE 94 06/01/2018   CHOL 164 05/30/2011   TRIG 86.0 05/30/2011   HDL 57.10 05/30/2011   LDLCALC 90 05/30/2011   ALT 13 02/16/2018   AST 18 02/16/2018   NA 137 06/01/2018   K 3.9 06/01/2018   CL 98 06/01/2018   CREATININE 0.54 (L) 06/01/2018   BUN 23 06/01/2018   CO2 34 (H) 06/01/2018   TSH 2.120 08/26/2014   INR 1.20 09/22/2017   HGBA1C 5.8 (H) 11/10/2016   Dated 04/22/16: cholesterol 186, triglycerides 273, HDL 45, LDL 86.  Dated 05/11/17 A1c 6.3%.  Dated 08/09/18: Hgb 12.2. BMET normal.   Ecg today shows NSR with nonspecific TWA. Unchanged from prior study in 2017.  Rate 77. I have personally reviewed and interpreted this study.  ASSESSMENT AND PLAN 1.  CAD- normal myoview in October 2015. No active angina. Ecg today is unchanged. Continue to monitor.  2. HTN well controlled  3. Paroxysmal Afib- only noted during episode of sepsis. No recurrent symptoms.   4. Tobacco abuse. Congratulated on smoking cessation.  5. 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 Multiple surgical procedures within the last 2 years. Associated with marked weight loss now returning.   6. COPD  7. LE edema. I suspect this is more related to venous stasis as well as low protein stores and refeeding. Agree with compression hose and lasix. Needs to limit salt more.  Follow up in one year.

## 2018-09-20 ENCOUNTER — Ambulatory Visit (INDEPENDENT_AMBULATORY_CARE_PROVIDER_SITE_OTHER): Payer: Medicare Other | Admitting: Cardiology

## 2018-09-20 ENCOUNTER — Encounter: Payer: Self-pay | Admitting: Cardiology

## 2018-09-20 VITALS — BP 120/70 | HR 77 | Ht 62.0 in | Wt 147.6 lb

## 2018-09-20 DIAGNOSIS — R600 Localized edema: Secondary | ICD-10-CM | POA: Diagnosis not present

## 2018-09-20 DIAGNOSIS — M7989 Other specified soft tissue disorders: Secondary | ICD-10-CM

## 2018-09-20 DIAGNOSIS — I10 Essential (primary) hypertension: Secondary | ICD-10-CM | POA: Diagnosis not present

## 2018-09-20 DIAGNOSIS — I2510 Atherosclerotic heart disease of native coronary artery without angina pectoris: Secondary | ICD-10-CM | POA: Diagnosis not present

## 2018-09-20 MED ORDER — NITROGLYCERIN 0.4 MG SL SUBL: 0.4000 mg | SUBLINGUAL_TABLET | SUBLINGUAL | 3 refills | Status: DC | PRN

## 2018-09-20 MED ORDER — FUROSEMIDE 20 MG PO TABS: 20.0000 mg | ORAL_TABLET | Freq: Every day | ORAL | 3 refills | Status: DC

## 2018-09-22 DIAGNOSIS — Z9889 Other specified postprocedural states: Secondary | ICD-10-CM | POA: Diagnosis not present

## 2018-09-22 DIAGNOSIS — Z431 Encounter for attention to gastrostomy: Secondary | ICD-10-CM | POA: Diagnosis not present

## 2018-10-12 DIAGNOSIS — Z881 Allergy status to other antibiotic agents status: Secondary | ICD-10-CM | POA: Diagnosis not present

## 2018-10-12 DIAGNOSIS — Z431 Encounter for attention to gastrostomy: Secondary | ICD-10-CM | POA: Diagnosis not present

## 2018-10-19 DIAGNOSIS — K316 Fistula of stomach and duodenum: Secondary | ICD-10-CM | POA: Diagnosis not present

## 2018-10-22 DIAGNOSIS — K439 Ventral hernia without obstruction or gangrene: Secondary | ICD-10-CM | POA: Diagnosis not present

## 2018-10-22 DIAGNOSIS — K660 Peritoneal adhesions (postprocedural) (postinfection): Secondary | ICD-10-CM | POA: Diagnosis not present

## 2018-10-22 DIAGNOSIS — K316 Fistula of stomach and duodenum: Secondary | ICD-10-CM | POA: Diagnosis not present

## 2018-11-02 DIAGNOSIS — K316 Fistula of stomach and duodenum: Secondary | ICD-10-CM | POA: Diagnosis not present

## 2018-11-16 DIAGNOSIS — R600 Localized edema: Secondary | ICD-10-CM | POA: Diagnosis not present

## 2018-11-16 DIAGNOSIS — E1151 Type 2 diabetes mellitus with diabetic peripheral angiopathy without gangrene: Secondary | ICD-10-CM | POA: Diagnosis not present

## 2018-11-16 DIAGNOSIS — D500 Iron deficiency anemia secondary to blood loss (chronic): Secondary | ICD-10-CM | POA: Diagnosis not present

## 2018-11-16 DIAGNOSIS — Z9889 Other specified postprocedural states: Secondary | ICD-10-CM | POA: Diagnosis not present

## 2018-11-16 DIAGNOSIS — I10 Essential (primary) hypertension: Secondary | ICD-10-CM | POA: Diagnosis not present

## 2018-11-16 DIAGNOSIS — K225 Diverticulum of esophagus, acquired: Secondary | ICD-10-CM | POA: Diagnosis not present

## 2018-11-16 DIAGNOSIS — L02211 Cutaneous abscess of abdominal wall: Secondary | ICD-10-CM | POA: Diagnosis not present

## 2018-11-16 DIAGNOSIS — K228 Other specified diseases of esophagus: Secondary | ICD-10-CM | POA: Diagnosis not present

## 2018-11-16 DIAGNOSIS — B9689 Other specified bacterial agents as the cause of diseases classified elsewhere: Secondary | ICD-10-CM | POA: Diagnosis not present

## 2018-11-30 DIAGNOSIS — K316 Fistula of stomach and duodenum: Secondary | ICD-10-CM | POA: Diagnosis not present

## 2019-01-07 DIAGNOSIS — M545 Low back pain: Secondary | ICD-10-CM | POA: Diagnosis not present

## 2019-01-07 DIAGNOSIS — Z79891 Long term (current) use of opiate analgesic: Secondary | ICD-10-CM | POA: Diagnosis not present

## 2019-01-07 DIAGNOSIS — G894 Chronic pain syndrome: Secondary | ICD-10-CM | POA: Diagnosis not present

## 2019-01-17 ENCOUNTER — Ambulatory Visit: Payer: Medicare Other | Admitting: Internal Medicine

## 2019-02-14 ENCOUNTER — Ambulatory Visit: Payer: Medicare Other | Admitting: Internal Medicine

## 2019-02-17 DIAGNOSIS — Z9010 Acquired absence of unspecified breast and nipple: Secondary | ICD-10-CM | POA: Diagnosis not present

## 2019-02-17 DIAGNOSIS — R600 Localized edema: Secondary | ICD-10-CM | POA: Diagnosis not present

## 2019-02-17 DIAGNOSIS — I10 Essential (primary) hypertension: Secondary | ICD-10-CM | POA: Diagnosis not present

## 2019-02-17 DIAGNOSIS — C50911 Malignant neoplasm of unspecified site of right female breast: Secondary | ICD-10-CM | POA: Diagnosis not present

## 2019-02-17 DIAGNOSIS — D500 Iron deficiency anemia secondary to blood loss (chronic): Secondary | ICD-10-CM | POA: Diagnosis not present

## 2019-02-17 DIAGNOSIS — S301XXA Contusion of abdominal wall, initial encounter: Secondary | ICD-10-CM | POA: Diagnosis not present

## 2019-02-17 DIAGNOSIS — F17200 Nicotine dependence, unspecified, uncomplicated: Secondary | ICD-10-CM | POA: Diagnosis not present

## 2019-02-17 DIAGNOSIS — K225 Diverticulum of esophagus, acquired: Secondary | ICD-10-CM | POA: Diagnosis not present

## 2019-02-17 DIAGNOSIS — K228 Other specified diseases of esophagus: Secondary | ICD-10-CM | POA: Diagnosis not present

## 2019-02-17 DIAGNOSIS — E1151 Type 2 diabetes mellitus with diabetic peripheral angiopathy without gangrene: Secondary | ICD-10-CM | POA: Diagnosis not present

## 2019-02-22 DIAGNOSIS — L089 Local infection of the skin and subcutaneous tissue, unspecified: Secondary | ICD-10-CM | POA: Diagnosis not present

## 2019-02-22 DIAGNOSIS — S31109D Unspecified open wound of abdominal wall, unspecified quadrant without penetration into peritoneal cavity, subsequent encounter: Secondary | ICD-10-CM | POA: Diagnosis not present

## 2019-02-28 DIAGNOSIS — M4180 Other forms of scoliosis, site unspecified: Secondary | ICD-10-CM | POA: Diagnosis not present

## 2019-02-28 DIAGNOSIS — M545 Low back pain: Secondary | ICD-10-CM | POA: Diagnosis not present

## 2019-02-28 DIAGNOSIS — M5136 Other intervertebral disc degeneration, lumbar region: Secondary | ICD-10-CM | POA: Diagnosis not present

## 2019-03-16 DIAGNOSIS — M5412 Radiculopathy, cervical region: Secondary | ICD-10-CM | POA: Diagnosis not present

## 2019-03-16 DIAGNOSIS — M7542 Impingement syndrome of left shoulder: Secondary | ICD-10-CM | POA: Diagnosis not present

## 2019-03-16 DIAGNOSIS — M7541 Impingement syndrome of right shoulder: Secondary | ICD-10-CM | POA: Diagnosis not present

## 2019-03-22 DIAGNOSIS — M546 Pain in thoracic spine: Secondary | ICD-10-CM | POA: Diagnosis not present

## 2019-03-22 DIAGNOSIS — M542 Cervicalgia: Secondary | ICD-10-CM | POA: Diagnosis not present

## 2019-04-15 DIAGNOSIS — K225 Diverticulum of esophagus, acquired: Secondary | ICD-10-CM | POA: Diagnosis not present

## 2019-04-15 DIAGNOSIS — F17200 Nicotine dependence, unspecified, uncomplicated: Secondary | ICD-10-CM | POA: Diagnosis not present

## 2019-04-15 DIAGNOSIS — E1151 Type 2 diabetes mellitus with diabetic peripheral angiopathy without gangrene: Secondary | ICD-10-CM | POA: Diagnosis not present

## 2019-04-15 DIAGNOSIS — D500 Iron deficiency anemia secondary to blood loss (chronic): Secondary | ICD-10-CM | POA: Diagnosis not present

## 2019-04-15 DIAGNOSIS — I10 Essential (primary) hypertension: Secondary | ICD-10-CM | POA: Diagnosis not present

## 2019-04-15 DIAGNOSIS — Z9010 Acquired absence of unspecified breast and nipple: Secondary | ICD-10-CM | POA: Diagnosis not present

## 2019-04-29 DIAGNOSIS — Z23 Encounter for immunization: Secondary | ICD-10-CM | POA: Diagnosis not present

## 2019-05-06 DIAGNOSIS — Z23 Encounter for immunization: Secondary | ICD-10-CM | POA: Diagnosis not present

## 2019-05-25 DIAGNOSIS — M2042 Other hammer toe(s) (acquired), left foot: Secondary | ICD-10-CM | POA: Diagnosis not present

## 2019-05-25 DIAGNOSIS — L97512 Non-pressure chronic ulcer of other part of right foot with fat layer exposed: Secondary | ICD-10-CM | POA: Diagnosis not present

## 2019-05-25 DIAGNOSIS — M2041 Other hammer toe(s) (acquired), right foot: Secondary | ICD-10-CM | POA: Diagnosis not present

## 2019-05-25 DIAGNOSIS — M79672 Pain in left foot: Secondary | ICD-10-CM | POA: Diagnosis not present

## 2019-05-25 DIAGNOSIS — M79671 Pain in right foot: Secondary | ICD-10-CM | POA: Diagnosis not present

## 2019-05-30 DIAGNOSIS — G894 Chronic pain syndrome: Secondary | ICD-10-CM | POA: Diagnosis not present

## 2019-06-10 DIAGNOSIS — Z79899 Other long term (current) drug therapy: Secondary | ICD-10-CM | POA: Diagnosis not present

## 2019-06-10 DIAGNOSIS — M12272 Villonodular synovitis (pigmented), left ankle and foot: Secondary | ICD-10-CM | POA: Diagnosis not present

## 2019-06-10 DIAGNOSIS — E119 Type 2 diabetes mellitus without complications: Secondary | ICD-10-CM | POA: Diagnosis not present

## 2019-06-10 DIAGNOSIS — M2042 Other hammer toe(s) (acquired), left foot: Secondary | ICD-10-CM | POA: Diagnosis not present

## 2019-06-10 DIAGNOSIS — Z01812 Encounter for preprocedural laboratory examination: Secondary | ICD-10-CM | POA: Diagnosis not present

## 2019-06-13 ENCOUNTER — Other Ambulatory Visit: Payer: Self-pay

## 2019-06-13 ENCOUNTER — Inpatient Hospital Stay (HOSPITAL_COMMUNITY)
Admission: EM | Admit: 2019-06-13 | Discharge: 2019-06-16 | DRG: 481 | Disposition: A | Discharge status: Home Health | Payer: Medicare Other | Attending: Internal Medicine | Admitting: Internal Medicine

## 2019-06-13 ENCOUNTER — Emergency Department (HOSPITAL_COMMUNITY): Payer: Medicare Other

## 2019-06-13 DIAGNOSIS — J449 Chronic obstructive pulmonary disease, unspecified: Secondary | ICD-10-CM | POA: Diagnosis not present

## 2019-06-13 DIAGNOSIS — W1809XA Striking against other object with subsequent fall, initial encounter: Secondary | ICD-10-CM | POA: Diagnosis present

## 2019-06-13 DIAGNOSIS — Z981 Arthrodesis status: Secondary | ICD-10-CM

## 2019-06-13 DIAGNOSIS — R52 Pain, unspecified: Secondary | ICD-10-CM | POA: Diagnosis not present

## 2019-06-13 DIAGNOSIS — I480 Paroxysmal atrial fibrillation: Secondary | ICD-10-CM | POA: Diagnosis not present

## 2019-06-13 DIAGNOSIS — Z853 Personal history of malignant neoplasm of breast: Secondary | ICD-10-CM

## 2019-06-13 DIAGNOSIS — I4891 Unspecified atrial fibrillation: Secondary | ICD-10-CM | POA: Diagnosis present

## 2019-06-13 DIAGNOSIS — F17210 Nicotine dependence, cigarettes, uncomplicated: Secondary | ICD-10-CM | POA: Diagnosis present

## 2019-06-13 DIAGNOSIS — Z20828 Contact with and (suspected) exposure to other viral communicable diseases: Secondary | ICD-10-CM | POA: Diagnosis present

## 2019-06-13 DIAGNOSIS — I10 Essential (primary) hypertension: Secondary | ICD-10-CM | POA: Diagnosis present

## 2019-06-13 DIAGNOSIS — Y92007 Garden or yard of unspecified non-institutional (private) residence as the place of occurrence of the external cause: Secondary | ICD-10-CM | POA: Diagnosis not present

## 2019-06-13 DIAGNOSIS — J439 Emphysema, unspecified: Secondary | ICD-10-CM | POA: Diagnosis present

## 2019-06-13 DIAGNOSIS — Z03818 Encounter for observation for suspected exposure to other biological agents ruled out: Secondary | ICD-10-CM | POA: Diagnosis not present

## 2019-06-13 DIAGNOSIS — G8929 Other chronic pain: Secondary | ICD-10-CM | POA: Diagnosis present

## 2019-06-13 DIAGNOSIS — D72829 Elevated white blood cell count, unspecified: Secondary | ICD-10-CM | POA: Diagnosis present

## 2019-06-13 DIAGNOSIS — S72341A Displaced spiral fracture of shaft of right femur, initial encounter for closed fracture: Principal | ICD-10-CM | POA: Diagnosis present

## 2019-06-13 DIAGNOSIS — M9701XA Periprosthetic fracture around internal prosthetic right hip joint, initial encounter: Secondary | ICD-10-CM | POA: Diagnosis present

## 2019-06-13 DIAGNOSIS — Z9842 Cataract extraction status, left eye: Secondary | ICD-10-CM | POA: Diagnosis not present

## 2019-06-13 DIAGNOSIS — Z961 Presence of intraocular lens: Secondary | ICD-10-CM | POA: Diagnosis present

## 2019-06-13 DIAGNOSIS — I2510 Atherosclerotic heart disease of native coronary artery without angina pectoris: Secondary | ICD-10-CM | POA: Diagnosis present

## 2019-06-13 DIAGNOSIS — Z8249 Family history of ischemic heart disease and other diseases of the circulatory system: Secondary | ICD-10-CM | POA: Diagnosis not present

## 2019-06-13 DIAGNOSIS — Z9011 Acquired absence of right breast and nipple: Secondary | ICD-10-CM

## 2019-06-13 DIAGNOSIS — I252 Old myocardial infarction: Secondary | ICD-10-CM | POA: Diagnosis not present

## 2019-06-13 DIAGNOSIS — Z4789 Encounter for other orthopedic aftercare: Secondary | ICD-10-CM | POA: Diagnosis not present

## 2019-06-13 DIAGNOSIS — M549 Dorsalgia, unspecified: Secondary | ICD-10-CM | POA: Diagnosis present

## 2019-06-13 DIAGNOSIS — M25561 Pain in right knee: Secondary | ICD-10-CM | POA: Diagnosis not present

## 2019-06-13 DIAGNOSIS — R410 Disorientation, unspecified: Secondary | ICD-10-CM | POA: Diagnosis not present

## 2019-06-13 DIAGNOSIS — W01198A Fall on same level from slipping, tripping and stumbling with subsequent striking against other object, initial encounter: Secondary | ICD-10-CM | POA: Diagnosis not present

## 2019-06-13 DIAGNOSIS — Z955 Presence of coronary angioplasty implant and graft: Secondary | ICD-10-CM | POA: Diagnosis not present

## 2019-06-13 DIAGNOSIS — M1710 Unilateral primary osteoarthritis, unspecified knee: Secondary | ICD-10-CM | POA: Diagnosis present

## 2019-06-13 DIAGNOSIS — Z419 Encounter for procedure for purposes other than remedying health state, unspecified: Secondary | ICD-10-CM

## 2019-06-13 DIAGNOSIS — S72344A Nondisplaced spiral fracture of shaft of right femur, initial encounter for closed fracture: Secondary | ICD-10-CM | POA: Diagnosis not present

## 2019-06-13 DIAGNOSIS — S7291XA Unspecified fracture of right femur, initial encounter for closed fracture: Secondary | ICD-10-CM

## 2019-06-13 DIAGNOSIS — Z96641 Presence of right artificial hip joint: Secondary | ICD-10-CM | POA: Diagnosis present

## 2019-06-13 DIAGNOSIS — Z9841 Cataract extraction status, right eye: Secondary | ICD-10-CM | POA: Diagnosis not present

## 2019-06-13 DIAGNOSIS — A419 Sepsis, unspecified organism: Secondary | ICD-10-CM | POA: Diagnosis not present

## 2019-06-13 DIAGNOSIS — Z881 Allergy status to other antibiotic agents status: Secondary | ICD-10-CM

## 2019-06-13 DIAGNOSIS — E785 Hyperlipidemia, unspecified: Secondary | ICD-10-CM | POA: Diagnosis present

## 2019-06-13 DIAGNOSIS — S72301A Unspecified fracture of shaft of right femur, initial encounter for closed fracture: Secondary | ICD-10-CM | POA: Diagnosis present

## 2019-06-13 DIAGNOSIS — Z79891 Long term (current) use of opiate analgesic: Secondary | ICD-10-CM

## 2019-06-13 DIAGNOSIS — W19XXXA Unspecified fall, initial encounter: Secondary | ICD-10-CM | POA: Diagnosis not present

## 2019-06-13 DIAGNOSIS — Z79899 Other long term (current) drug therapy: Secondary | ICD-10-CM

## 2019-06-13 LAB — CBC WITH DIFFERENTIAL/PLATELET
Abs Immature Granulocytes: 0.05 10*3/uL (ref 0.00–0.07)
Basophils Absolute: 0 10*3/uL (ref 0.0–0.1)
Basophils Relative: 0 %
Eosinophils Absolute: 0 10*3/uL (ref 0.0–0.5)
Eosinophils Relative: 0 %
HCT: 39.8 % (ref 36.0–46.0)
Hemoglobin: 13.9 g/dL (ref 12.0–15.0)
Immature Granulocytes: 0 %
Lymphocytes Relative: 12 %
Lymphs Abs: 1.5 10*3/uL (ref 0.7–4.0)
MCH: 31.1 pg (ref 26.0–34.0)
MCHC: 34.9 g/dL (ref 30.0–36.0)
MCV: 89 fL (ref 80.0–100.0)
Monocytes Absolute: 0.7 10*3/uL (ref 0.1–1.0)
Monocytes Relative: 6 %
Neutro Abs: 10.5 10*3/uL — ABNORMAL HIGH (ref 1.7–7.7)
Neutrophils Relative %: 82 %
Platelets: 208 10*3/uL (ref 150–400)
RBC: 4.47 MIL/uL (ref 3.87–5.11)
RDW: 14.3 % (ref 11.5–15.5)
WBC: 12.9 10*3/uL — ABNORMAL HIGH (ref 4.0–10.5)
nRBC: 0 % (ref 0.0–0.2)

## 2019-06-13 LAB — CREATININE, SERUM
Creatinine, Ser: 0.74 mg/dL (ref 0.44–1.00)
GFR calc Af Amer: >60 mL/min (ref >60)
GFR calc non Af Amer: >60 mL/min (ref >60)

## 2019-06-13 LAB — CBC
HCT: 40 % (ref 36.0–46.0)
Hemoglobin: 14.1 g/dL (ref 12.0–15.0)
MCH: 31.1 pg (ref 26.0–34.0)
MCHC: 35.3 g/dL (ref 30.0–36.0)
MCV: 88.3 fL (ref 80.0–100.0)
Platelets: 205 10*3/uL (ref 150–400)
RBC: 4.53 MIL/uL (ref 3.87–5.11)
RDW: 14.4 % (ref 11.5–15.5)
WBC: 11.2 10*3/uL — ABNORMAL HIGH (ref 4.0–10.5)
nRBC: 0 % (ref 0.0–0.2)

## 2019-06-13 LAB — BASIC METABOLIC PANEL
Anion gap: 11 (ref 5–15)
BUN: 14 mg/dL (ref 8–23)
CO2: 25 mmol/L (ref 22–32)
Calcium: 8.7 mg/dL — ABNORMAL LOW (ref 8.9–10.3)
Chloride: 97 mmol/L — ABNORMAL LOW (ref 98–111)
Creatinine, Ser: 0.81 mg/dL (ref 0.44–1.00)
GFR calc Af Amer: >60 mL/min (ref >60)
GFR calc non Af Amer: >60 mL/min (ref >60)
Glucose, Bld: 151 mg/dL — ABNORMAL HIGH (ref 70–99)
Potassium: 3.3 mmol/L — ABNORMAL LOW (ref 3.5–5.1)
Sodium: 133 mmol/L — ABNORMAL LOW (ref 135–145)

## 2019-06-13 LAB — PROTIME-INR
INR: 1 (ref 0.8–1.2)
Prothrombin Time: 13.3 seconds (ref 11.4–15.2)

## 2019-06-13 LAB — SARS CORONAVIRUS 2 BY RT PCR (HOSPITAL ORDER, PERFORMED IN ~~LOC~~ HOSPITAL LAB): SARS Coronavirus 2: NEGATIVE

## 2019-06-13 MED ORDER — OXYCODONE HCL 5 MG PO TABS
15.0000 mg | ORAL_TABLET | Freq: Four times a day (QID) | ORAL | Status: DC
  Administered 2019-06-13 – 2019-06-16 (×9): 15 mg via ORAL
  Filled 2019-06-13 (×9): qty 3

## 2019-06-13 MED ORDER — ALBUTEROL SULFATE (2.5 MG/3ML) 0.083% IN NEBU: 2.5000 mg | INHALATION_SOLUTION | Freq: Four times a day (QID) | RESPIRATORY_TRACT | Status: DC | PRN

## 2019-06-13 MED ORDER — HYDROMORPHONE HCL 1 MG/ML IJ SOLN
2.0000 mg | INTRAMUSCULAR | Status: DC | PRN
  Administered 2019-06-13 (×2): 2 mg via INTRAVENOUS
  Filled 2019-06-13 (×2): qty 2

## 2019-06-13 MED ORDER — HYDROMORPHONE HCL 1 MG/ML IJ SOLN
1.0000 mg | INTRAMUSCULAR | Status: DC | PRN
  Administered 2019-06-13 – 2019-06-14 (×5): 1 mg via INTRAVENOUS
  Filled 2019-06-13 (×5): qty 1

## 2019-06-13 MED ORDER — MOMETASONE FURO-FORMOTEROL FUM 200-5 MCG/ACT IN AERO
2.0000 | INHALATION_SPRAY | Freq: Two times a day (BID) | RESPIRATORY_TRACT | Status: DC
  Administered 2019-06-14 – 2019-06-16 (×4): 2 via RESPIRATORY_TRACT
  Filled 2019-06-13: qty 8.8

## 2019-06-13 MED ORDER — ENOXAPARIN SODIUM 40 MG/0.4ML ~~LOC~~ SOLN: 40.0000 mg | Freq: Every day | SUBCUTANEOUS | Status: DC

## 2019-06-13 MED ORDER — GABAPENTIN 300 MG PO CAPS
600.0000 mg | ORAL_CAPSULE | Freq: Three times a day (TID) | ORAL | Status: DC
  Administered 2019-06-13 – 2019-06-16 (×9): 600 mg via ORAL
  Filled 2019-06-13 (×9): qty 2

## 2019-06-13 MED ORDER — MORPHINE SULFATE ER 15 MG PO TBCR
30.0000 mg | EXTENDED_RELEASE_TABLET | Freq: Four times a day (QID) | ORAL | Status: DC
  Administered 2019-06-13 – 2019-06-16 (×9): 30 mg via ORAL
  Filled 2019-06-13 (×9): qty 2

## 2019-06-13 MED ORDER — HYDROMORPHONE HCL 1 MG/ML IJ SOLN
2.0000 mg | Freq: Once | INTRAMUSCULAR | Status: AC
  Administered 2019-06-13: 2 mg via INTRAVENOUS
  Filled 2019-06-13: qty 2

## 2019-06-13 MED ORDER — FUROSEMIDE 20 MG PO TABS
20.0000 mg | ORAL_TABLET | Freq: Every day | ORAL | Status: DC
  Administered 2019-06-14 – 2019-06-16 (×3): 20 mg via ORAL
  Filled 2019-06-13 (×4): qty 1

## 2019-06-13 NOTE — H&P (Signed)
History and Physical    [name] HQI:[id number] DOB: Sep 15, 1946 DOA: 06/13/2019  PCP: Ileana Ladd, MD  Patient coming from: Home  I have personally briefly reviewed patient's old medical records in [facility]  Chief Complaint: Right upper extremity and knee pain  HPI: [name] is a 73 y.o. female with medical history significant of breast cancer status post right mastectomy, COPD with current tobacco use, atrial fibrillation not on anticoagulation, hypertension, osteo-arthritis, chronic back pain due to degenerative disc disorder who presented status post a fall and right hip and knee pain.  Patient was working in the garden this afternoon moving large bags of dirt when she tripped on a stone and landed on her right knee onto the gardening stones.  Patient states that she heard a crack and also felt movement around her knee.  She laid there for 20 minutes until she was found by her neighbor who informed her husband and called EMS.  Denies any chest pain or shortness of breath.  ED Course: She was afebrile and normotensive on room air.  She was in visible discomfort secondary to pain.  CBC shows mild leukocytosis of 12.9.   Right knee x-ray shows partially visualized spiral fracture of the right femoral shaft.  Serpiginous sclerotic lesion in the distal femur and proximal tibia may represent bone infarct.  Hip x-ray showed large displaced spiral fracture involving the mid shaft of the right femur spanning approximately 8.1 cm.  Right hip hardware appears intact.  Review of Systems:  Constitutional: No Weight Change, No Fever ENT/Mouth: No sore throat, No Rhinorrhea Eyes: No Eye Pain, No Vision Changes Cardiovascular: No Chest Pain, no SOB Respiratory: No Cough, No Sputum, No Wheezing, no Dyspnea  Gastrointestinal: No Nausea, No Vomiting, No Diarrhea, No Constipation, No Pain Genitourinary: no Urinary Incontinence, No Urgency, No Flank Pain Musculoskeletal: No Arthralgias,  No Myalgias Skin: No Skin Lesions, No Pruritus, Neuro: no Weakness, No Numbness,  No Loss of Consciousness, No Syncope Psych: No Anxiety/Panic, No Depression, no decrease appetite Heme/Lymph: No Bruising, No Bleeding  Past Medical History:  Diagnosis Date  . Asthma   . Breast cancer (HCC)    right breast  . COPD (chronic obstructive pulmonary disease) (HCC)   . Coronary artery disease   . Diverticulum of esophagus   . Elevated LFTs   . Emphysema lung (HCC)   . ETOH abuse   . H/O atrial fibrillation without current medication    only one time when she had sepsis  . Hyperlipidemia   . Hypertension    hx of but not on any medications  . Myocardial infarction (HCC) 2000  . OA (osteoarthritis) of knee   . Osteoarthritis   . Tobacco abuse     Past Surgical History:  Procedure Laterality Date  . ANTERIOR HIP REVISION Right 10/22/2015   Procedure: RIGHT  HIP REVISION;  Surgeon: Durene Romans, MD;  Location: [facility];  Service: Orthopedics;  Laterality: Right;  . APPLICATION OF WOUND VAC N/A 06/20/2015   Procedure: APPLICATION OF INCISIONAL WOUND VAC;  Surgeon: Venita Lick, MD;  Location: [facility];  Service: Orthopedics;  Laterality: N/A;  . BREAST SURGERY  1991   right mastectomy  . CARDIAC CATHETERIZATION  04/05/2009   EF 60%  . CARDIOVASCULAR STRESS TEST  01/31/2005   EF 58%  . CESAREAN SECTION  '78, '80, '81   x 3  . CORONARY ANGIOPLASTY  08/1998   x2 OF A BIFURCATION OM-1, OM-2 LESION  .  CORONARY ANGIOPLASTY WITH STENT PLACEMENT  01/1999   MID FIRST OBTUSE MARGINAL VESSEL  . CORONARY ANGIOPLASTY WITH STENT PLACEMENT  07/1999   STENTING AT THE CRUX OF THE RIGHT CORONARY ARTERY WITH A 3.8MM X TETRA STENT  . DIRECT LARYNGOSCOPY N/A 05/03/2015   Procedure: DIRECT LARYNGOSCOPY;  Surgeon: Flo Shanks, MD;  Location: [facility] OR;  Service: ENT;  Laterality: N/A;  . EYE SURGERY  05/18/2014,06/01/2014   BILATERAL CATARACT S WITH LENS IMPLANTS  . GASTROSTOMY N/A 05/04/2015   Procedure:  OPEN GASTROSTOMY WITH TUBE PLACEMENT;  Surgeon: Manus Rudd, MD;  Location: [facility];  Service: General;  Laterality: N/A;  . GASTROSTOMY N/A 11/13/2016   Procedure: INSERTION OF GASTROSTOMY TUBE;  Surgeon: Abigail Miyamoto, MD;  Location: [facility] OR;  Service: General;  Laterality: N/A;  . HARDWARE REMOVAL N/A 05/03/2015   Procedure: HARDWARE REMOVAL;  Surgeon: Venita Lick, MD;  Location: [facility];  Service: Orthopedics;  Laterality: N/A;  . HIP CLOSED REDUCTION Right 04/26/2015   Procedure: CLOSED REDUCTION HIP;  Surgeon: Venita Lick, MD;  Location: [facility];  Service: Orthopedics;  Laterality: Right;  . HYSTEROSCOPY     D & C  . INCISION AND DRAINAGE ABSCESS N/A 05/03/2015   Procedure: INCISION AND DRAINAGE CERVICAL  ABSCESS AND REMOVAL OF HARDWARE;  Surgeon: Venita Lick, MD;  Location: [facility];  Service: Orthopedics;  Laterality: N/A;  . IR CM INJ ANY COLONIC TUBE W/FLUORO  10/14/2017  . IR CM INJ ANY COLONIC TUBE W/FLUORO  10/23/2017  . IR CM INJ ANY COLONIC TUBE W/FLUORO  10/28/2017  . IR REPLC GASTRO/COLONIC TUBE PERCUT W/FLUORO  09/22/2017  . JOINT REPLACEMENT  08/2011   bilateral hip  . JOINT REPLACEMENT  01/2012   right hip  . MASTECTOMY    . neck fusion  2011  . PELVIC LAPAROSCOPY  2002   RSO-    . RADICAL NECK DISSECTION N/A 11/08/2016   Procedure: INCISION AND DRAINAGE OF NECK ABSCESS;  Surgeon: Osborn Coho, MD;  Location: [facility] OR;  Service: ENT;  Laterality: N/A;  . RADIOLOGY WITH ANESTHESIA Right 06/28/2015   Procedure: MRI OF CERVICAL SPINE  AND RIGHT HIP  WITH AND WITHOUT CONTRAST    (RADIOLOGY WITH ANESTHESIA);  Surgeon: Medication Radiologist, MD;  Location: [facility];  Service: Radiology;  Laterality: Right;  . REMOVAL OF GASTROSTOMY TUBE N/A 11/14/2016   Procedure: REMOVAL OF GASTROSTOMY TUBE W/ REPLACEMENT OF GASTROSTOMY TUBE;  Surgeon: Abigail Miyamoto, MD;  Location: [facility];  Service: General;  Laterality: N/A;  . RIGID ESOPHAGOSCOPY N/A 05/03/2015   Procedure: RIGID ESOPHAGOSCOPY;   Surgeon: Flo Shanks, MD;  Location: [facility] OR;  Service: ENT;  Laterality: N/A;  . TONSILLECTOMY AND ADENOIDECTOMY    . TOTAL HIP ARTHROPLASTY  08/2010   bilat  . VULVECTOMY  1981   partial     reports that she has quit smoking. Her smoking use included cigarettes. She has a 50.00 pack-year smoking history. She has never used smokeless tobacco. She reports that she does not drink alcohol or use drugs.   Previously had quit tobacco use but reports that she recently started smoking 1 pack/day again secondary to living with other tobacco users.  Allergies  Allergen Reactions  . Cephalexin Other (See Comments)    Took off first layer of skin inside of mouth  . Zithromax [Azithromycin Dihydrate] Other (See Comments)    ORAL ULCERS     Family History  Problem Relation Age of Onset  . Diabetes Mother   .  Hypertension Father   . Heart disease Father   . Heart attack Father   . Stroke Father    Family history reviewed and not pertinent   Prior to Admission medications   Medication Sig Start Date End Date Taking? Authorizing Provider  albuterol (PROVENTIL HFA;VENTOLIN HFA) 108 (90 BASE) MCG/ACT inhaler Inhale 2 puffs into the lungs every 6 (six) hours as needed for wheezing.     [provider]  ALPRAZolam Prudy Feeler) 0.25 MG tablet Take one tablet daily or as needed 05/14/11   [place], Peter M, MD  budesonide-formoterol [facility]) 160-4.5 MCG/ACT inhaler Inhale 2 puffs into the lungs 2 (two) times daily as needed (Wheezing). Reported on 10/16/2015    [provider]  cholestyramine light (PREVALITE) 4 g packet Take 1 packet (4 g total) by mouth every 12 (twelve) hours. 09/22/17   Rolly Salter, MD  furosemide (LASIX) 20 MG tablet Take 1 tablet (20 mg total) by mouth daily. 09/20/18 09/15/19  [place], Peter M, MD  gabapentin (NEURONTIN) 600 MG tablet Take 600 mg by mouth 4 (four) times daily.     [provider]  morphine (MS CONTIN) 30 MG 12 hr tablet Take 30 mg by  mouth 4 (four) times daily.  12/23/16   [provider]  nitroGLYCERIN (NITROSTAT) 0.4 MG SL tablet Place 1 tablet (0.4 mg total) under the tongue every 5 (five) minutes as needed for chest pain. 09/20/18 12/19/18  [place], Peter M, MD  oxyCODONE (ROXICODONE) 15 MG immediate release tablet Take 15 mg by mouth 4 (four) times daily.    [provider]    Physical Exam: Vitals:   06/13/19 1730 06/13/19 1930 06/13/19 2000 06/13/19 2030  BP: 110/81 120/69 (!) 142/63 134/76  Pulse: 83 81 83 77  Resp:      Temp:      TempSrc:      SpO2: 96% 97% 97% 90%    Constitutional: well-appearing female laying on her left side in discomfort secondary to pain Vitals:   06/13/19 1730 06/13/19 1930 06/13/19 2000 06/13/19 2030  BP: 110/81 120/69 (!) 142/63 134/76  Pulse: 83 81 83 77  Resp:      Temp:      TempSrc:      SpO2: 96% 97% 97% 90%   Eyes: PERRL, lids and conjunctivae normal ENMT: Mucous membranes are moist. Posterior pharynx clear of any exudate or lesions.Normal dentition.  Neck: normal, supple, no masses Respiratory: Decreased aeration with mild expiratory wheezes throughout, no crackles. Normal respiratory effort. No accessory muscle use. On room air.  Cardiovascular: Regular rate and rhythm, no murmurs / rubs / gallops. No extremity edema. 2+ pedal pulses. No carotid bruits.  Abdomen: Mild epigastric tenderness, no masses palpated.  Bowel sounds positive.  Musculoskeletal: No obvious deformities of the hip joint or knee.  No ecchymosis or hematoma around her right lower extremity.  Unable to assess range of motion of the lower extremity given patient's intolerance to pain secondary fracture.  2+ pedal pulses and intact sensation of the lower extremity. Skin: no rashes, lesions, ulcers. No induration Neurologic: CN 2-12 grossly intact. Sensation intact, unable to assess lower extremity strength secondary to intolerance to pain. Psychiatric: Normal judgment and insight. Alert  and oriented x 3. Normal mood.    Labs on Admission: I have personally reviewed following labs and imaging studies  CBC: Recent Labs  Lab 06/13/19 1906  WBC 12.9*  NEUTROABS 10.5*  HGB 13.9  HCT 39.8  MCV 89.0  PLT 208   Basic Metabolic Panel: Recent Labs  Lab 06/13/19 1906  NA 133*  K 3.3*  CL 97*  CO2 25  GLUCOSE 151*  BUN 14  CREATININE 0.81  CALCIUM 8.7*   GFR: CrCl cannot be calculated (Unknown ideal weight.). Liver Function Tests: No results for input(s): AST, ALT, ALKPHOS, BILITOT, PROT, ALBUMIN in the last 168 hours. No results for input(s): LIPASE, AMYLASE in the last 168 hours. No results for input(s): AMMONIA in the last 168 hours. Coagulation Profile: Recent Labs  Lab 06/13/19 1906  INR 1.0   Cardiac Enzymes: No results for input(s): CKTOTAL, CKMB, CKMBINDEX, TROPONINI in the last 168 hours. BNP (last 3 results) No results for input(s): PROBNP in the last 8760 hours. HbA1C: No results for input(s): HGBA1C in the last 72 hours. CBG: No results for input(s): GLUCAP in the last 168 hours. Lipid Profile: No results for input(s): CHOL, HDL, LDLCALC, TRIG, CHOLHDL, LDLDIRECT in the last 72 hours. Thyroid Function Tests: No results for input(s): TSH, T4TOTAL, FREET4, T3FREE, THYROIDAB in the last 72 hours. Anemia Panel: No results for input(s): VITAMINB12, FOLATE, FERRITIN, TIBC, IRON, RETICCTPCT in the last 72 hours. 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 Pt stated that she was moving bags of dirt in her yard today, when she tripped over a stone and fell on her right knee. Pt said she felt something move in her knee when she fell on it. Pt h.*comment was truncated* EXAM: RIGHT KNEE - 1-2 VIEW COMPARISON:  None. FINDINGS: Partially visualized spiral fracture of the right femoral shaft. There are serpiginous sclerotic lesions in the distal femur and proximal tibia which may represent bony infarct. Osteopenia. No significant knee joint effusion. No evidence of acute fracture or dislocation in the right knee. IMPRESSION: 1.  Partially visualized spiral fracture of the right femoral shaft. 2. Serpiginous sclerotic lesions in the distal femur and proximal tibia may represent bony infarct. Electronically Signed   By: Emmaline Kluver M.D.   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 Pt stated that she was moving bags of dirt in her yard today, when she tripped over a stone and fell on her right knee. Pt said she felt something move in her knee when she fell on it. EXAM: DG HIP (WITH OR WITHOUT PELVIS) 2-3V RIGHT COMPARISON:  Hip radiographs 10/22/2015 FINDINGS: Limited evaluation of hip joint alignment given the patient's positioning. The arthroplasty hardware appears intact. There is a large displaced spiral fracture involving the mid shaft of the right femur spanning approximately 8.1 cm. IMPRESSION: 1. Large displaced spiral fracture involving the mid shaft of the right femur spanning approximately 8.1 cm. 2. Limited evaluation of the right hip joint alignment due to patient positioning. The right hip hardware appears intact. Electronically Signed  By: Emmaline Kluver M.D.   On: 06/13/2019 19:01    EKG: Independently reviewed.   Assessment/Plan  Spiral fracture of the  midshaft of the right femur - EDP had consulted Ortho Dr. Barry Dienes who will evaluate her - keep NPO past midnight  - ortho recommended knee immobilizer for comfort but patient declined secondary to pain -Patient has extensive opioid regimen at home for her chronic back pain.   -Will continue her on morphine 30 mg QID.  Continue oxycodone 15 mg 4 times daily. Outside records from Dr. Jodie Echevaria shows that she is taking her opioids appropriately. -1 mg of dilaudid PRN q2 hrs for severe pain  Hypertension -Continue Lasix  COPD -Stable -Continue albuterol and Dulera    DVT prophylaxis:Lovenox Code Status:Full Family Communication: Plan discussed with patient at bedside  disposition Plan: Home with at least 2 midnight stays  Consults called: Orthopedic surgery Admission status: inpatient  Wayne Brunker T Jonquil Stubbe DO [facility]   If 7PM-7AM, please contact night-coverage [email or web address] Password [email or web address]  06/13/2019, 9:02 PM

## 2019-06-13 NOTE — ED Notes (Signed)
ED TO INPATIENT HANDOFF REPORT  ED Nurse Name and Phone #:  [phone]  S Name/Age/Gender [name] 73 y.o. female Room/Bed: 042C/042C  Code Status   Code Status: Full Code  Home/SNF/Other Home Patient oriented to: self, place, time and situation Is this baseline? Yes   Triage Complete: Triage complete  Chief Complaint Fall; Hip Dislocation  Triage Note Pt was moving heavy bags of dirt when she tripped and fell onto stone concrete. Pt has history of double hip replacements and heavy narcotic pain management use. Patient endorses 10/10 pain   Allergies Allergies  Allergen Reactions  . Cephalexin Other (See Comments)    Took off first layer of skin inside of mouth  . Zithromax [Azithromycin Dihydrate] Other (See Comments)    ORAL ULCERS     Level of Care/Admitting Diagnosis ED Disposition    ED Disposition Condition Comment   Admit  Hospital Area: [facility] [[id number]]  Level of Care: Med-Surg [16]  Covid Evaluation: Asymptomatic Screening Protocol (No Symptoms)  Diagnosis: Closed fracture of shaft of right femur, unspecified fracture morphology, sequela [id number]  Admitting Physician: Orene Desanctis [id number]  Attending Physician: Orene Desanctis [id number]  Estimated length of stay: past midnight tomorrow  Certification:: I certify this patient will need inpatient services for at least 2 midnights  PT Class (Do Not Modify): Inpatient [101]  PT Acc Code (Do Not Modify): Private [1]       B Medical/Surgery History Past Medical History:  Diagnosis Date  . Asthma   . Breast cancer ([place])    right breast  . COPD (chronic obstructive pulmonary disease) ([place])   . Coronary artery disease   . Diverticulum of esophagus   . Elevated LFTs   . Emphysema lung ([place])   . ETOH abuse   . H/O atrial fibrillation without current medication    only one time when she had sepsis  . Hyperlipidemia   . Hypertension    hx of but not on any medications  .  Myocardial infarction ([place]) 2000  . OA (osteoarthritis) of knee   . Osteoarthritis   . Tobacco abuse    Past Surgical History:  Procedure Laterality Date  . ANTERIOR HIP REVISION Right 10/22/2015   Procedure: RIGHT  HIP REVISION;  Surgeon: Paralee Cancel, MD;  Location: [facility];  Service: Orthopedics;  Laterality: Right;  . APPLICATION OF WOUND VAC N/A 06/20/2015   Procedure: APPLICATION OF INCISIONAL WOUND VAC;  Surgeon: Melina Schools, MD;  Location: [facility];  Service: Orthopedics;  Laterality: N/A;  . BREAST SURGERY  1991   right mastectomy  . CARDIAC CATHETERIZATION  04/05/2009   EF 60%  . CARDIOVASCULAR STRESS TEST  01/31/2005   EF 58%  . CESAREAN SECTION  '78, '80, '81   x 3  . CORONARY ANGIOPLASTY  08/1998   x2 OF A BIFURCATION OM-1, OM-2 LESION  . CORONARY ANGIOPLASTY WITH STENT PLACEMENT  01/1999   MID FIRST OBTUSE MARGINAL VESSEL  . CORONARY ANGIOPLASTY WITH STENT PLACEMENT  07/1999   STENTING AT THE CRUX OF THE RIGHT CORONARY ARTERY WITH A 3.8MM X 18MM TETRA STENT  . DIRECT LARYNGOSCOPY N/A 05/03/2015   Procedure: DIRECT LARYNGOSCOPY;  Surgeon: Jodi Marble, MD;  Location: [facility];  Service: ENT;  Laterality: N/A;  . EYE SURGERY  05/18/2014,06/01/2014   BILATERAL CATARACT S WITH LENS IMPLANTS  . GASTROSTOMY N/A 05/04/2015   Procedure: OPEN GASTROSTOMY WITH TUBE PLACEMENT;  Surgeon: Donnie Mesa, MD;  Location: [facility];  Service: General;  Laterality: N/A;  . GASTROSTOMY N/A 11/13/2016   Procedure: INSERTION OF GASTROSTOMY TUBE;  Surgeon: Coralie Keens, MD;  Location: [facility];  Service: General;  Laterality: N/A;  . HARDWARE REMOVAL N/A 05/03/2015   Procedure: HARDWARE REMOVAL;  Surgeon: Melina Schools, MD;  Location: [facility];  Service: Orthopedics;  Laterality: N/A;  . HIP CLOSED REDUCTION Right 04/26/2015   Procedure: CLOSED REDUCTION HIP;  Surgeon: Melina Schools, MD;  Location[facility];  Service: Orthopedics;  Laterality: Right;  . HYSTEROSCOPY     D & C  . INCISION AND DRAINAGE ABSCESS  N/A 05/03/2015   Procedure: INCISION AND DRAINAGE CERVICAL  ABSCESS AND REMOVAL OF HARDWARE;  Surgeon: Melina Schools, MD;  Location: [facility];  Service: Orthopedics;  Laterality: N/A;  . IR CM INJ ANY COLONIC TUBE W/FLUORO  10/14/2017  . IR CM INJ ANY COLONIC TUBE W/FLUORO  10/23/2017  . IR CM INJ ANY COLONIC TUBE W/FLUORO  10/28/2017  . IR [place] PERCUT W/FLUORO  09/22/2017  . JOINT REPLACEMENT  08/2011   bilateral hip  . JOINT REPLACEMENT  01/2012   right hip  . MASTECTOMY    . neck fusion  2011  . PELVIC LAPAROSCOPY  2002   RSO-    . RADICAL NECK DISSECTION N/A 11/08/2016   Procedure: INCISION AND DRAINAGE OF NECK ABSCESS;  Surgeon: Jerrell Belfast, MD;  Location: [facility];  Service: ENT;  Laterality: N/A;  . RADIOLOGY WITH ANESTHESIA Right 06/28/2015   Procedure: MRI OF CERVICAL SPINE  AND RIGHT HIP  WITH AND WITHOUT CONTRAST    (RADIOLOGY WITH ANESTHESIA);  Surgeon: Medication Radiologist, MD;  Location: [facility];  Service: Radiology;  Laterality: Right;  . REMOVAL OF GASTROSTOMY TUBE N/A 11/14/2016   Procedure: REMOVAL OF GASTROSTOMY TUBE W/ REPLACEMENT OF GASTROSTOMY TUBE;  Surgeon: Coralie Keens, MD;  Location: [facility];  Service: General;  Laterality: N/A;  . RIGID ESOPHAGOSCOPY N/A 05/03/2015   Procedure: RIGID ESOPHAGOSCOPY;  Surgeon: Jodi Marble, MD;  Location: [facility];  Service: ENT;  Laterality: N/A;  . TONSILLECTOMY AND ADENOIDECTOMY    . TOTAL HIP ARTHROPLASTY  08/2010   bilat  . VULVECTOMY  1981   partial     A IV Location/Drains/Wounds Patient Lines/Drains/Airways Status   Active Line/Drains/Airways    Name:   Placement date:   Placement time:   Site:   Days:   Peripheral IV 06/13/19 Right Hand   06/13/19    1917    Hand   less than 1   Peripheral IV 06/13/19 Left Forearm   06/13/19    2040    Forearm   less than 1   PICC Double Lumen 09/22/17 PICC Left Brachial 41 cm 0 cm   09/22/17    1313     629   Open Drain 1 Anterior Neck 0.5 Fr.   11/08/16    1410    Neck    947   Gastrostomy/Enterostomy Gastrostomy 18 Fr. LUQ   05/04/15    1019    LUQ   1501   Gastrostomy/Enterostomy Gastrostomy 18 Fr. LUQ   11/14/16    1500    LUQ   941   Gastrostomy/Enterostomy Gastrostomy 18 Fr. LLQ   09/22/17    1500    LLQ   629   Incision (Closed) 05/03/15 Neck Other (Comment)   05/03/15    0941     1502   Incision (Closed) 05/04/15 Abdomen Other (Comment)   05/04/15  1042     1501   Incision (Closed) 06/20/15 Neck   06/20/15    1546     1454   Incision (Closed) 10/22/15 Hip Right   10/22/15    1502     1330   Incision (Closed) 11/08/16 Neck Other (Comment)   11/08/16    1424     947   Incision (Closed) 11/13/16 Abdomen Other (Comment)   11/13/16    1533     942   Incision (Closed) 11/14/16 Abdomen Other (Comment)   11/14/16    1502     941   Incision (Closed) 09/21/17 Abdomen Left;Mid;Lower   09/21/17    2018     630   Pressure Injury 09/21/17 Stage I -  Intact skin with non-blanchable redness of a localized area usually over a bony prominence.   09/21/17    -     630          Intake/Output Last 24 hours No intake or output data in the 24 hours ending 06/13/19 2147  Labs/Imaging Results for orders placed or performed during the hospital encounter of 06/13/19 (from the past 48 hour(s))  Basic metabolic panel     Status: Abnormal   Collection Time: 06/13/19  7:06 PM  Result Value Ref Range   Sodium 133 (L) 135 - 145 mmol/L   Potassium 3.3 (L) 3.5 - 5.1 mmol/L   Chloride 97 (L) 98 - 111 mmol/L   CO2 25 22 - 32 mmol/L   Glucose, Bld 151 (H) 70 - 99 mg/dL   BUN 14 8 - 23 mg/dL   Creatinine, Ser 0.81 0.44 - 1.00 mg/dL   Calcium 8.7 (L) 8.9 - 10.3 mg/dL   GFR calc non Af Amer >60 >60 mL/min   GFR calc Af Amer >60 >60 mL/min   Anion gap 11 5 - 15    Comment: Performed at [facility], [address] [place], [place] [address]  CBC with Differential     Status: Abnormal   Collection Time: 06/13/19  7:06 PM  Result Value Ref Range   WBC 12.9 (H) 4.0 -  10.5 K/uL   RBC 4.47 3.87 - 5.11 MIL/uL   Hemoglobin 13.9 12.0 - 15.0 g/dL   HCT 39.8 36.0 - 46.0 %   MCV 89.0 80.0 - 100.0 fL   MCH 31.1 26.0 - 34.0 pg   MCHC 34.9 30.0 - 36.0 g/dL   RDW 14.3 11.5 - 15.5 %   Platelets 208 150 - 400 K/uL   nRBC 0.0 0.0 - 0.2 %   Neutrophils Relative % 82 %   Neutro Abs 10.5 (H) 1.7 - 7.7 K/uL   Lymphocytes Relative 12 %   Lymphs Abs 1.5 0.7 - 4.0 K/uL   Monocytes Relative 6 %   Monocytes Absolute 0.7 0.1 - 1.0 K/uL   Eosinophils Relative 0 %   Eosinophils Absolute 0.0 0.0 - 0.5 K/uL   Basophils Relative 0 %   Basophils Absolute 0.0 0.0 - 0.1 K/uL   Immature Granulocytes 0 %   Abs Immature Granulocytes 0.05 0.00 - 0.07 K/uL    Comment: Performed at [facility], [address] [place], [place] [address]  Protime-INR     Status: None   Collection Time: 06/13/19  7:06 PM  Result Value Ref Range   Prothrombin Time 13.3 11.4 - 15.2 seconds   INR 1.0 0.8 - 1.2    Comment: (NOTE) INR goal varies based  on device and disease states. Performed at [facility], [place] [address] [place], [place] [address]   SARS Coronavirus 2 [facility] order, Performed in [facility] hospital lab) Nasopharyngeal Nasopharyngeal Swab     Status: None   Collection Time: 06/13/19  7:29 PM   Specimen: Nasopharyngeal Swab  Result Value Ref Range   SARS Coronavirus 2 NEGATIVE NEGATIVE    Comment: (NOTE) If result is NEGATIVE SARS-CoV-2 target nucleic acids are NOT DETECTED. The SARS-CoV-2 RNA is generally detectable in upper and lower  respiratory specimens during the acute phase of infection. The lowest  concentration of SARS-CoV-2 viral copies this assay can detect is 250  copies / mL. A negative result does not preclude SARS-CoV-2 infection  and should not be used as the sole basis for treatment or other  patient management decisions.  A negative result may occur with  improper specimen collection / handling, submission of specimen other  than  nasopharyngeal swab, presence of viral mutation(s) within the  areas targeted by this assay, and inadequate number of viral copies  (<250 copies / mL). A negative result must be combined with clinical  observations, patient history, and epidemiological information. If result is POSITIVE SARS-CoV-2 target nucleic acids are DETECTED. The SARS-CoV-2 RNA is generally detectable in upper and lower  respiratory specimens dur ing the acute phase of infection.  Positive  results are indicative of active infection with SARS-CoV-2.  Clinical  correlation with patient history and other diagnostic information is  necessary to determine patient infection status.  Positive results do  not rule out bacterial infection or co-infection with other viruses. If result is PRESUMPTIVE POSTIVE SARS-CoV-2 nucleic acids MAY BE PRESENT.   A presumptive positive result was obtained on the submitted specimen  and confirmed on repeat testing.  While 2019 novel coronavirus  (SARS-CoV-2) nucleic acids may be present in the submitted sample  additional confirmatory testing may be necessary for epidemiological  and / or clinical management purposes  to differentiate between  SARS-CoV-2 and other Sarbecovirus currently known to infect humans.  If clinically indicated additional testing with an alternate test  methodology [phone]) is advised. The SARS-CoV-2 RNA is generally  detectable in upper and lower respiratory sp ecimens during the acute  phase of infection. The expected result is Negative. Fact Sheet for Patients:  StrictlyIdeas.no Fact Sheet for Healthcare Providers: BankingDealers.co.za This test is not yet approved or cleared by the [place] FDA and has been authorized for detection and/or diagnosis of SARS-CoV-2 by FDA under an Emergency Use Authorization (EUA).  This EUA will remain in effect (meaning this test can be used) for the duration of  the COVID-19 declaration under Section 564(b)(1) of the Act, 21 U.S.C. section 360bbb-3(b)(1), unless the authorization is terminated or revoked sooner. Performed at [facility], [place] [address], [place] [address]   CBC     Status: Abnormal   Collection Time: 06/13/19  9:13 PM  Result Value Ref Range   WBC 11.2 (H) 4.0 - 10.5 K/uL   RBC 4.53 3.87 - 5.11 MIL/uL   Hemoglobin 14.1 12.0 - 15.0 g/dL   HCT 40.0 36.0 - 46.0 %   MCV 88.3 80.0 - 100.0 fL   MCH 31.1 26.0 - 34.0 pg   MCHC 35.3 30.0 - 36.0 g/dL   RDW 14.4 11.5 - 15.5 %   Platelets 205 150 - 400 K/uL   nRBC 0.0 0.0 - 0.2 %    Comment: Performed at [facility], [place] [address], [place], [place] [address]  Creatinine, serum     Status: None   Collection Time: 06/13/19  9:13 PM  Result Value Ref Range   Creatinine, Ser 0.74 0.44 - 1.00 mg/dL   GFR calc non Af Amer >60 >60 mL/min   GFR calc Af Amer >60 >60 mL/min    Comment: Performed at [facility] [address] [place], [place] [address]   Dg Knee 2 Views Right  Result Date: 06/13/2019 CLINICAL DATA:  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 Pt stated that she was moving bags of dirt in her yard today, when she tripped over a stone and fell on her right knee. Pt said she felt something move in her knee when she fell on it. Pt h.*comment was truncated* EXAM: RIGHT KNEE - 1-2 VIEW COMPARISON:  None. FINDINGS: Partially visualized spiral fracture of the right femoral shaft. There are serpiginous sclerotic lesions in the distal femur and proximal tibia which may represent bony infarct. Osteopenia. No significant knee joint effusion. No evidence of acute fracture or dislocation in the right knee. IMPRESSION: 1.  Partially visualized spiral fracture of the right femoral shaft. 2. Serpiginous sclerotic lesions in the distal femur and proximal tibia may represent bony infarct. Electronically Signed   By: Audie Pinto M.D.   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 Pt stated that she was moving bags of dirt in her yard today, when she tripped over a stone and fell on her right knee. Pt said she felt something move in her knee when she fell on it. EXAM: DG HIP (WITH OR WITHOUT PELVIS) 2-3V RIGHT COMPARISON:  Hip radiographs 10/22/2015 FINDINGS: Limited evaluation of hip joint alignment given the patient's positioning. The arthroplasty hardware appears intact. There is a large displaced spiral fracture involving the mid shaft of the right femur spanning approximately 8.1 cm. IMPRESSION: 1. Large displaced spiral fracture involving the mid shaft of the right femur spanning approximately 8.1 cm. 2. Limited evaluation of the right hip joint alignment due to patient positioning. The right hip hardware appears intact. Electronically Signed   By: Audie Pinto M.D.   On: 06/13/2019 19:01    Pending Labs Unresulted Labs (From admission, onward)    Start     Ordered   06/20/19 0500  Creatinine, serum  (enoxaparin (LOVENOX)    CrCl >/= 30 ml/min)  Weekly,   R    Comments: while on enoxaparin therapy    06/13/19 2054          Vitals/Pain Today's Vitals   06/13/19 1930 06/13/19 2000 06/13/19 2030 06/13/19 2041  BP: 120/69 (!) 142/63 134/76   Pulse: 81 83 77   Resp:      Temp:      TempSrc:      SpO2: 97% 97% 90%   PainSc:    9     Isolation Precautions No active isolations  Medications Medications  enoxaparin (LOVENOX) injection 40 mg (has no administration in time range)  HYDROmorphone (DILAUDID) injection 1 mg (has no administration in time range)  morphine (MS CONTIN) 12 hr tablet 30 mg (has no administration in time range)  oxyCODONE (Oxy IR/ROXICODONE) immediate release tablet 15 mg (has no administration in time range)  furosemide (LASIX) tablet 20 mg (has no administration in time range)  gabapentin (NEURONTIN) tablet 600  mg (has no administration in time range)  mometasone-formoterol (DULERA) 200-5 MCG/ACT inhaler 2 puff (has no administration in time range)  albuterol (VENTOLIN HFA) 108 (90 Base) MCG/ACT inhaler 2 puff (has no administration in time range)  HYDROmorphone (DILAUDID) injection 2 mg (2 mg Intravenous Given 06/13/19 1917)    Mobility non-ambulatory Moderate fall risk   Focused Assessments Cardiac Assessment Handoff:  Cardiac Rhythm: Normal sinus rhythm Lab Results  Component Value Date   CKTOTAL 91 04/05/2009   CKMB 1.2 04/05/2009   TROPONINI 1.20 ([place]) 08/27/2014   No results found for: DDIMER Does the Patient currently have chest pain? No     R Recommendations: See Admitting Provider Note  Report given to:   Additional Notes:

## 2019-06-13 NOTE — ED Triage Notes (Signed)
Pt was moving heavy bags of dirt when she tripped and fell onto stone concrete. Pt has history of double hip replacements and heavy narcotic pain management use. Patient endorses 10/10 pain

## 2019-06-13 NOTE — ED Provider Notes (Signed)
[facility] EMERGENCY DEPARTMENT Provider Note   CSN: [id number] Arrival date & time: 06/13/19  1653     History   Chief Complaint Chief Complaint  Patient presents with   Leg Injury    HPI [name] is a 73 y.o. female past medical history of osteoarthritis presents emergency department with mechanical fall and injury to the right hip and right knee.  She reports she was hauling heavy weight from her garden today and slipped on the pavement and landed on her right knee and her right hip.  She describes immediate severe pain mostly in her right hip.  She feels a disfigurement in the hip and believes that she popped out her socket.  She has a prosthetic hip that was done many many years ago, and states she has not had any dislocation since the operation.  She has not been able to walk since her fall. She denies any blood thinner use. She reports pain in her knee. She denies any numbness in her right foot. She denies any lower back pain. She denies striking her head or loss of consciousness.  She is on a quite extensive opioid pain medicine regimen at home, including morphine ER 30 mg tablets and oxycodone 15 mg tablets per PDMP.     HPI  Past Medical History:  Diagnosis Date   Asthma    Breast cancer ([place])    right breast   COPD (chronic obstructive pulmonary disease) (HCC)    Coronary artery disease    Diverticulum of esophagus    Elevated LFTs    Emphysema lung (HCC)    ETOH abuse    H/O atrial fibrillation without current medication    only one time when she had sepsis   Hyperlipidemia    Hypertension    hx of but not on any medications   Myocardial infarction ([place]) 2000   OA (osteoarthritis) of knee    Osteoarthritis    Tobacco abuse     Patient Active Problem List   Diagnosis Date Noted   Closed fracture of shaft of right femur ([place]) 06/13/2019   Pressure injury of skin 09/22/2017   Chronic pain 09/20/2017    Abscess of upper lobe of left lung with pneumonia ([place])    Tobacco consumption 02/12/2016   Hoarseness of voice 01/07/2016   S/P right TH revision 10/22/2015   S/P revision of total hip 10/22/2015   Infected wound, initial encounter 06/20/2015   Cholecystitis    Esophageal fistula    Abscess    Discitis    HCAP (healthcare-associated pneumonia)    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   Abscess of neck    Esophageal perforation    Loss of weight    Esophageal dysphagia    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   Hip dislocation, right ([place]) 04/26/2015   Healthcare-associated pneumonia 04/26/2015   Cholecystitis, acute 04/26/2015   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[place]) 04/26/2015   PAF (paroxysmal atrial fibrillation) ([place]) 09/20/2014   Elevated troponin 09/20/2014   Acute renal failure syndrome ([place])    Malnutrition of moderate degree ([place]) 08/29/2014   Pneumonia    Respiratory failure ([place])    UTI (urinary tract infection) 08/27/2014   Sepsis, unspecified organism ([place]) 08/27/2014   Atrial fibrillation with RVR ([place]) 08/27/2014   Shock circulatory ([place]) 08/27/2014   Acute kidney injury ([place]) 08/27/2014   Encephalopathy acute 08/27/2014   Hypokalemia 08/26/2014   Hyponatremia 08/26/2014   Acute renal failure (  [place]) 08/26/2014   Weakness 08/26/2014   ETOH abuse 08/07/2014   Edema extremities 07/12/2014   Atelectasis 08/16/2011   Diabetes mellitus type 2, insulin dependent (HCC)    COPD (chronic obstructive pulmonary disease) ([place])    Tobacco dependence    Hyperlipidemia    Essential hypertension    Coronary artery disease     Past Surgical History:  Procedure Laterality Date   ANTERIOR HIP REVISION Right 10/22/2015   Procedure: RIGHT  HIP REVISION;  Surgeon: Paralee Cancel, MD;  Location: [facility];  Service: Orthopedics;  Laterality: Right;   APPLICATION OF WOUND VAC N/A 06/20/2015   Procedure:  APPLICATION OF INCISIONAL WOUND VAC;  Surgeon: Melina Schools, MD;  Location: [place];  Service: Orthopedics;  Laterality: N/A;   BREAST SURGERY  1991   right mastectomy   CARDIAC CATHETERIZATION  04/05/2009   EF 60%   CARDIOVASCULAR STRESS TEST  01/31/2005   EF 58%   CESAREAN SECTION  '78, '80, '81   x 3   CORONARY ANGIOPLASTY  08/1998   x2 OF A BIFURCATION OM-1, OM-2 LESION   CORONARY ANGIOPLASTY WITH STENT PLACEMENT  01/1999   MID FIRST OBTUSE MARGINAL VESSEL   CORONARY ANGIOPLASTY WITH STENT PLACEMENT  07/1999   STENTING AT THE CRUX OF THE RIGHT CORONARY ARTERY WITH A 3.8MM X 18MM TETRA STENT   DIRECT LARYNGOSCOPY N/A 05/03/2015   Procedure: DIRECT LARYNGOSCOPY;  Surgeon: Jodi Marble, MD;  Location: [place];  Service: ENT;  Laterality: N/A;   EYE SURGERY  05/18/2014,06/01/2014   BILATERAL CATARACT S WITH LENS IMPLANTS   GASTROSTOMY N/A 05/04/2015   Procedure: OPEN GASTROSTOMY WITH TUBE PLACEMENT;  Surgeon: Donnie Mesa, MD;  Location: [place];  Service: General;  Laterality: N/A;   GASTROSTOMY N/A 11/13/2016   Procedure: INSERTION OF GASTROSTOMY TUBE;  Surgeon: Coralie Keens, MD;  Location: [place];  Service: General;  Laterality: N/A;   HARDWARE REMOVAL N/A 05/03/2015   Procedure: HARDWARE REMOVAL;  Surgeon: Melina Schools, MD;  Location: [place];  Service: Orthopedics;  Laterality: N/A;   HIP CLOSED REDUCTION Right 04/26/2015   Procedure: CLOSED REDUCTION HIP;  Surgeon: Melina Schools, MD;  [facility];  Service: Orthopedics;  Laterality: Right;   HYSTEROSCOPY     D & C   INCISION AND DRAINAGE ABSCESS N/A 05/03/2015   Procedure: INCISION AND DRAINAGE CERVICAL  ABSCESS AND REMOVAL OF HARDWARE;  Surgeon: Melina Schools, MD;  Location: [place];  Service: Orthopedics;  Laterality: N/A;   IR CM INJ ANY COLONIC TUBE W/FLUORO  10/14/2017   IR CM INJ ANY COLONIC TUBE W/FLUORO  10/23/2017   IR CM INJ ANY COLONIC TUBE W/FLUORO  10/28/2017   IR REPLC GASTRO/COLONIC TUBE PERCUT W/FLUORO   09/22/2017   JOINT REPLACEMENT  08/2011   bilateral hip   JOINT REPLACEMENT  01/2012   right hip   MASTECTOMY     neck fusion  2011   PELVIC LAPAROSCOPY  2002   RSO-     RADICAL NECK DISSECTION N/A 11/08/2016   Procedure: INCISION AND DRAINAGE OF NECK ABSCESS;  Surgeon: Jerrell Belfast, MD;  Location: [place];  Service: ENT;  Laterality: N/A;   RADIOLOGY WITH ANESTHESIA Right 06/28/2015   Procedure: MRI OF CERVICAL SPINE  AND RIGHT HIP  WITH AND WITHOUT CONTRAST    (RADIOLOGY WITH ANESTHESIA);  Surgeon: Medication Radiologist, MD;  Location: [place];  Service: Radiology;  Laterality: Right;   REMOVAL OF GASTROSTOMY TUBE N/A 11/14/2016   Procedure: REMOVAL OF  GASTROSTOMY TUBE W/ REPLACEMENT OF GASTROSTOMY TUBE;  Surgeon: Coralie Keens, MD;  Location: [place];  Service: General;  Laterality: N/A;   RIGID ESOPHAGOSCOPY N/A 05/03/2015   Procedure: RIGID ESOPHAGOSCOPY;  Surgeon: Jodi Marble, MD;  Location: [place];  Service: ENT;  Laterality: N/A;   TONSILLECTOMY AND ADENOIDECTOMY     TOTAL HIP ARTHROPLASTY  08/2010   bilat   VULVECTOMY  1981   partial     OB History   No obstetric history on file.      Home Medications    Prior to Admission medications   Medication Sig Start Date End Date Taking? Authorizing Provider  albuterol (PROVENTIL HFA;VENTOLIN HFA) 108 (90 BASE) MCG/ACT inhaler Inhale 2 puffs into the lungs every 6 (six) hours as needed for wheezing.    Yes [provider]  ALPRAZolam Duanne Moron) 0.25 MG tablet Take one tablet daily or as needed 05/14/11  Yes Martinique, Peter M, MD  budesonide-formoterol [facility]) 160-4.5 MCG/ACT inhaler Inhale 2 puffs into the lungs 2 (two) times daily as needed (Wheezing). Reported on 10/16/2015   Yes [provider]  diclofenac (VOLTAREN) 75 MG EC tablet Take 75 mg by mouth daily as needed for pain.   Yes [provider]  diclofenac sodium (VOLTAREN) 1 % GEL Apply 1 application topically 4 (four) times daily as  needed for pain.   Yes [provider]  furosemide (LASIX) 20 MG tablet Take 1 tablet (20 mg total) by mouth daily. 09/20/18 09/15/19 Yes Martinique, Peter M, MD  gabapentin (NEURONTIN) 600 MG tablet Take 600 mg by mouth 4 (four) times daily.    Yes [provider]  methocarbamol (ROBAXIN) 500 MG tablet Take 500 mg by mouth daily as needed for muscle spasms.   Yes [provider]  morphine (MS CONTIN) 30 MG 12 hr tablet Take 30 mg by mouth 4 (four) times daily.  12/23/16  Yes [provider]  nitroGLYCERIN (NITROSTAT) 0.4 MG SL tablet Place 1 tablet (0.4 mg total) under the tongue every 5 (five) minutes as needed for chest pain. 09/20/18 06/13/19 Yes Martinique, Peter M, MD  oxyCODONE (ROXICODONE) 15 MG immediate release tablet Take 15 mg by mouth 4 (four) times daily.   Yes [provider]    Family History Family History  Problem Relation Age of Onset   Diabetes Mother    Hypertension Father    Heart disease Father    Heart attack Father    Stroke Father     Social History Social History   Tobacco Use   Smoking status: Former Smoker    Packs/day: 1.00    Years: 50.00    Pack years: 50.00    Types: Cigarettes   Smokeless tobacco: Never Used   Tobacco comment: using chantix  Substance Use Topics   Alcohol use: No    Alcohol/week: 0.0 standard drinks    Comment: former alcohol abuse   Drug use: No     Allergies   Cephalexin and Zithromax [azithromycin dihydrate]   Review of Systems Review of Systems  Constitutional: Negative for chills and fever.  Respiratory: Negative for cough and shortness of breath.   Cardiovascular: Negative for chest pain and palpitations.  Gastrointestinal: Positive for nausea. Negative for abdominal pain and vomiting.  Genitourinary: Negative for dysuria.  Musculoskeletal: Positive for arthralgias, back pain, gait problem and myalgias. Negative for neck pain and neck stiffness.  Neurological: Negative  for syncope and headaches.  All other systems reviewed and are negative.  Physical Exam Updated Vital Signs BP (!) 163/73 (BP Location: Right Arm)    Pulse 80    Temp (!) 97.4 F (36.3 C) (Oral)    Resp 20    Ht 5\' 2"  (1.575 m)    Wt 68 kg    SpO2 93%    BMI 27.42 kg/m   Physical Exam Vitals signs and nursing note reviewed.  Constitutional:      General: She is in acute distress.     Appearance: She is well-developed.     Comments: Lying on bed on left side with legs bent  HENT:     Head: Normocephalic and atraumatic.  Eyes:     Conjunctiva/sclera: Conjunctivae normal.  Neck:     Musculoskeletal: Neck supple.  Cardiovascular:     Rate and Rhythm: Normal rate and regular rhythm.     Pulses: Normal pulses.  Pulmonary:     Effort: Pulmonary effort is normal. No respiratory distress.  Musculoskeletal:     Comments: Significant tenderness of the right leg near the hip, mid-thigh and right knee Patellar tenderness to palpation Unable to assess shortening or malposition of leg given patient's posture in bed (she will not re-position herself due to pain)  Skin:    General: Skin is warm and dry.  Neurological:     General: No focal deficit present.     Mental Status: She is alert and oriented to person, place, and time.      ED Treatments / Results  Labs (all labs ordered are listed, but only abnormal results are displayed) Labs Reviewed  BASIC METABOLIC PANEL - Abnormal; Notable for the following components:      Result Value   Sodium 133 (*)    Potassium 3.3 (*)    Chloride 97 (*)    Glucose, Bld 151 (*)    Calcium 8.7 (*)    All other components within normal limits  CBC WITH DIFFERENTIAL/PLATELET - Abnormal; Notable for the following components:   WBC 12.9 (*)    Neutro Abs 10.5 (*)    All other components within normal limits  CBC - Abnormal; Notable for the following components:   WBC 11.2 (*)    All other components within normal limits  SARS CORONAVIRUS 2  (HOSPITAL ORDER, [facility])  PROTIME-INR  CREATININE, SERUM    EKG None  Radiology Dg Knee 2 Views Right  Result Date: 06/13/2019 CLINICAL DATA:  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 Pt stated that she was moving bags of dirt in her yard today, when she tripped over a stone and fell on her right knee. Pt said she felt something move in her knee when she fell on it. Pt h.*comment was truncated* EXAM: RIGHT KNEE - 1-2 VIEW COMPARISON:  None. FINDINGS: Partially visualized spiral fracture of the right femoral shaft. There are serpiginous sclerotic lesions in the distal femur and proximal tibia which may represent bony infarct. Osteopenia. No significant knee joint effusion. No evidence of acute fracture or dislocation in the right knee. IMPRESSION: 1.  Partially visualized spiral fracture of the right femoral shaft. 2. Serpiginous sclerotic lesions in the distal femur and proximal tibia may represent bony infarct. Electronically Signed   By: Audie Pinto M.D.   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 Pt stated that she was moving bags of dirt in her yard today,  when she tripped over a stone and fell on her right knee. Pt said she felt something move in her knee when she fell on it. EXAM: DG HIP (WITH OR WITHOUT PELVIS) 2-3V RIGHT COMPARISON:  Hip radiographs 10/22/2015 FINDINGS: Limited evaluation of hip joint alignment given the patient's positioning. The arthroplasty hardware appears intact. There is a large displaced spiral fracture involving the mid shaft of the right femur spanning approximately 8.1 cm. IMPRESSION: 1. Large displaced spiral fracture involving the mid shaft of the right femur spanning approximately 8.1 cm. 2. Limited evaluation of the right hip joint alignment due to patient positioning. The right hip hardware appears intact. Electronically  Signed   By: Audie Pinto M.D.   On: 06/13/2019 19:01    Procedures Procedures (including critical care time)  Medications Ordered in ED Medications  enoxaparin (LOVENOX) injection 40 mg (has no administration in time range)  HYDROmorphone (DILAUDID) injection 1 mg (1 mg Intravenous Given 06/13/19 2154)  morphine (MS CONTIN) 12 hr tablet 30 mg (30 mg Oral Given 06/13/19 2149)  oxyCODONE (Oxy IR/ROXICODONE) immediate release tablet 15 mg (15 mg Oral Given 06/13/19 2148)  furosemide (LASIX) tablet 20 mg (20 mg Oral Not Given 06/13/19 2149)  gabapentin (NEURONTIN) capsule 600 mg (has no administration in time range)  mometasone-formoterol (DULERA) 200-5 MCG/ACT inhaler 2 puff (2 puffs Inhalation Not Given 06/13/19 2214)  albuterol (PROVENTIL) (2.5 MG/3ML) 0.083% nebulizer solution 2.5 mg (has no administration in time range)  HYDROmorphone (DILAUDID) injection 2 mg (2 mg Intravenous Given 06/13/19 1917)     Initial Impression / Assessment and Plan / ED Course  I have reviewed the triage vital signs and the nursing notes.  Pertinent labs & imaging results that were available during my care of the patient were reviewed by me and considered in my medical decision making (see chart for details).  Patient is a 73 year old female with a history of prosthetic hips presenting to the emergency department with mechanical fall and pain in her right hip mid leg and right knee.  She reports it was a fall from standing.  She is been unable to ambulate.  She appears quite uncomfortable on my exam.  She does appear to be neurovascularly intact in her distal extremities.  There is no evidence of open fracture.  We will give her pain control with IV Dilaudid.  She is on a very extensive pain control regimen at home with multiple opioids.  She likely has a very high tolerance to opioids.  We will also attempt to get x-rays.  I suspect a hip fracture versus dislocation given the degree of her  discomfort.  Clinical Course as of Jun 13 2347  Mon Jun 13, 2019  1904  IMPRESSION: 1. Large displaced spiral fracture involving the mid shaft of the right femur spanning approximately 8.1 cm. 2. Limited evaluation of the right hip joint alignment due to patient positioning. The right hip hardware appears intact.   [MT]  1909 Patient forms of results of x-ray.  Made n.p.o.  Will give another dose of 2 mg Dilaudid.  She has a very high opioid tolerance and unfortunately is in a lot of pain right now.   [MT]  1909 Ortho paged   [MT]  1952 I spoke to Dr Honor Loh the patient' sorthopedist and he states admit to hospitalist, NPO at midnight, and if possible place patient in knee immobilizer for comfort   [MT]  2000 I did recommend knee immobilizer for patient, who adamantly refused,  stating she has worn one in the past and thinks the pain would be intolerable.  I explained this was the orthopedic recommendation and may infact help her pain by stabilizing her fracture, but she continued to refuse.   [MT]  2023 Signout given to hospitalist, admitted   [MT]    Clinical Course User Index [MT] Meesha Sek, Carola Rhine, MD    Final Clinical Impressions(s) / ED Diagnoses   Final diagnoses:  Closed fracture of right femur, unspecified fracture morphology, initial encounter [facility])    ED Discharge Orders    None       Wyvonnia Dusky, MD 06/13/19 2351

## 2019-06-14 ENCOUNTER — Inpatient Hospital Stay (HOSPITAL_COMMUNITY): Payer: Medicare Other | Admitting: Certified Registered Nurse Anesthetist

## 2019-06-14 ENCOUNTER — Encounter (HOSPITAL_COMMUNITY)
Admission: EM | Disposition: A | Discharge status: Home Health | Payer: Self-pay | Source: Home / Self Care | Attending: Internal Medicine

## 2019-06-14 ENCOUNTER — Inpatient Hospital Stay (HOSPITAL_COMMUNITY): Payer: Medicare Other

## 2019-06-14 ENCOUNTER — Encounter (HOSPITAL_COMMUNITY): Payer: Self-pay | Admitting: Surgery

## 2019-06-14 HISTORY — PX: ORIF FEMUR FRACTURE: SHX2119

## 2019-06-14 LAB — SURGICAL PCR SCREEN
MRSA, PCR: NEGATIVE
Staphylococcus aureus: NEGATIVE

## 2019-06-14 SURGERY — OPEN REDUCTION INTERNAL FIXATION (ORIF) DISTAL FEMUR FRACTURE
Anesthesia: General | Laterality: Right

## 2019-06-14 MED ORDER — 0.9 % SODIUM CHLORIDE (POUR BTL) OPTIME
TOPICAL | Status: DC | PRN
  Administered 2019-06-14: 16:00:00 1000 mL

## 2019-06-14 MED ORDER — PROPOFOL 10 MG/ML IV BOLUS
INTRAVENOUS | Status: DC | PRN
  Administered 2019-06-14: 120 mg via INTRAVENOUS
  Administered 2019-06-14: 50 mg via INTRAVENOUS

## 2019-06-14 MED ORDER — ONDANSETRON HCL 4 MG/2ML IJ SOLN
INTRAMUSCULAR | Status: DC | PRN
  Administered 2019-06-14: 4 mg via INTRAVENOUS

## 2019-06-14 MED ORDER — LACTATED RINGERS IV SOLN
INTRAVENOUS | Status: DC
  Administered 2019-06-14: 16:00:00 via INTRAVENOUS

## 2019-06-14 MED ORDER — LIDOCAINE 2% (20 MG/ML) 5 ML SYRINGE
INTRAMUSCULAR | Status: DC | PRN
  Administered 2019-06-14: 60 mg via INTRAVENOUS

## 2019-06-14 MED ORDER — ASPIRIN EC 81 MG PO TBEC
81.0000 mg | DELAYED_RELEASE_TABLET | Freq: Two times a day (BID) | ORAL | Status: DC
  Administered 2019-06-14 – 2019-06-16 (×3): 81 mg via ORAL
  Filled 2019-06-14 (×4): qty 1

## 2019-06-14 MED ORDER — PANTOPRAZOLE SODIUM 40 MG PO TBEC
40.0000 mg | DELAYED_RELEASE_TABLET | Freq: Every day | ORAL | Status: DC
  Administered 2019-06-14 – 2019-06-16 (×3): 40 mg via ORAL
  Filled 2019-06-14 (×3): qty 1

## 2019-06-14 MED ORDER — METOCLOPRAMIDE HCL 5 MG/ML IJ SOLN: 5.0000 mg | Freq: Three times a day (TID) | INTRAMUSCULAR | Status: DC | PRN

## 2019-06-14 MED ORDER — VANCOMYCIN HCL IN DEXTROSE 1-5 GM/200ML-% IV SOLN
1000.0000 mg | Freq: Two times a day (BID) | INTRAVENOUS | Status: AC
  Administered 2019-06-15: 1000 mg via INTRAVENOUS
  Filled 2019-06-14: qty 200

## 2019-06-14 MED ORDER — METOCLOPRAMIDE HCL 5 MG PO TABS: 5.0000 mg | ORAL_TABLET | Freq: Three times a day (TID) | ORAL | Status: DC | PRN

## 2019-06-14 MED ORDER — ENSURE PRE-SURGERY PO LIQD
296.0000 mL | Freq: Once | ORAL | Status: AC
  Administered 2019-06-14: 296 mL via ORAL
  Filled 2019-06-14: qty 296

## 2019-06-14 MED ORDER — DEXAMETHASONE SODIUM PHOSPHATE 10 MG/ML IJ SOLN
INTRAMUSCULAR | Status: DC | PRN
  Administered 2019-06-14: 10 mg via INTRAVENOUS

## 2019-06-14 MED ORDER — MENTHOL 3 MG MT LOZG: 1.0000 | LOZENGE | OROMUCOSAL | Status: DC | PRN

## 2019-06-14 MED ORDER — VANCOMYCIN HCL IN DEXTROSE 1-5 GM/200ML-% IV SOLN
1000.0000 mg | INTRAVENOUS | Status: AC
  Administered 2019-06-14: 1000 mg via INTRAVENOUS
  Filled 2019-06-14: qty 200

## 2019-06-14 MED ORDER — MIDAZOLAM HCL 2 MG/2ML IJ SOLN
INTRAMUSCULAR | Status: AC
  Filled 2019-06-14: qty 2

## 2019-06-14 MED ORDER — SODIUM CHLORIDE 0.9 % IV SOLN
INTRAVENOUS | Status: DC
  Administered 2019-06-14 – 2019-06-15 (×2): via INTRAVENOUS

## 2019-06-14 MED ORDER — FERROUS SULFATE 325 (65 FE) MG PO TABS
325.0000 mg | ORAL_TABLET | Freq: Two times a day (BID) | ORAL | Status: DC
  Administered 2019-06-15 – 2019-06-16 (×3): 325 mg via ORAL
  Filled 2019-06-14 (×3): qty 1

## 2019-06-14 MED ORDER — METHOCARBAMOL 1000 MG/10ML IJ SOLN
500.0000 mg | Freq: Four times a day (QID) | INTRAVENOUS | Status: DC | PRN
  Administered 2019-06-15: 500 mg via INTRAVENOUS
  Filled 2019-06-14: qty 5
  Filled 2019-06-14: qty 500

## 2019-06-14 MED ORDER — ALUM & MAG HYDROXIDE-SIMETH 200-200-20 MG/5ML PO SUSP: 30.0000 mL | ORAL | Status: DC | PRN

## 2019-06-14 MED ORDER — ONDANSETRON HCL 4 MG/2ML IJ SOLN: 4.0000 mg | Freq: Four times a day (QID) | INTRAMUSCULAR | Status: DC | PRN

## 2019-06-14 MED ORDER — FENTANYL CITRATE (PF) 100 MCG/2ML IJ SOLN
INTRAMUSCULAR | Status: AC
  Filled 2019-06-14: qty 2

## 2019-06-14 MED ORDER — POVIDONE-IODINE 10 % EX SWAB: 2.0000 "application " | Freq: Once | CUTANEOUS | Status: DC

## 2019-06-14 MED ORDER — NICOTINE 14 MG/24HR TD PT24
14.0000 mg | MEDICATED_PATCH | Freq: Every day | TRANSDERMAL | Status: DC
  Filled 2019-06-14 (×3): qty 1

## 2019-06-14 MED ORDER — PHENOL 1.4 % MT LIQD: 1.0000 | OROMUCOSAL | Status: DC | PRN

## 2019-06-14 MED ORDER — FENTANYL CITRATE (PF) 100 MCG/2ML IJ SOLN
25.0000 ug | INTRAMUSCULAR | Status: DC | PRN
  Administered 2019-06-14 (×2): 50 ug via INTRAVENOUS

## 2019-06-14 MED ORDER — DEXAMETHASONE SODIUM PHOSPHATE 10 MG/ML IJ SOLN
INTRAMUSCULAR | Status: AC
  Filled 2019-06-14: qty 1

## 2019-06-14 MED ORDER — HYDROMORPHONE HCL 1 MG/ML IJ SOLN
0.5000 mg | INTRAMUSCULAR | Status: DC | PRN
  Administered 2019-06-15: 2 mg via INTRAVENOUS
  Administered 2019-06-15: 1 mg via INTRAVENOUS
  Administered 2019-06-16 (×2): 2 mg via INTRAVENOUS
  Filled 2019-06-14 (×3): qty 2
  Filled 2019-06-14: qty 1

## 2019-06-14 MED ORDER — FENTANYL CITRATE (PF) 250 MCG/5ML IJ SOLN
INTRAMUSCULAR | Status: AC
  Filled 2019-06-14: qty 5

## 2019-06-14 MED ORDER — DOCUSATE SODIUM 100 MG PO CAPS
100.0000 mg | ORAL_CAPSULE | Freq: Two times a day (BID) | ORAL | Status: DC
  Administered 2019-06-14 – 2019-06-16 (×4): 100 mg via ORAL
  Filled 2019-06-14 (×4): qty 1

## 2019-06-14 MED ORDER — SUGAMMADEX SODIUM 200 MG/2ML IV SOLN
INTRAVENOUS | Status: DC | PRN
  Administered 2019-06-14: 150 mg via INTRAVENOUS

## 2019-06-14 MED ORDER — POLYETHYLENE GLYCOL 3350 17 G PO PACK: 17.0000 g | PACK | Freq: Every day | ORAL | Status: DC | PRN

## 2019-06-14 MED ORDER — CHLORHEXIDINE GLUCONATE 4 % EX LIQD
60.0000 mL | Freq: Once | CUTANEOUS | Status: AC
  Administered 2019-06-14: 4 via TOPICAL
  Filled 2019-06-14: qty 60

## 2019-06-14 MED ORDER — METHOCARBAMOL 500 MG PO TABS
500.0000 mg | ORAL_TABLET | Freq: Four times a day (QID) | ORAL | Status: DC | PRN
  Administered 2019-06-16: 04:00:00 500 mg via ORAL
  Filled 2019-06-14: qty 1

## 2019-06-14 MED ORDER — ONDANSETRON HCL 4 MG PO TABS: 4.0000 mg | ORAL_TABLET | Freq: Four times a day (QID) | ORAL | Status: DC | PRN

## 2019-06-14 MED ORDER — ONDANSETRON HCL 4 MG/2ML IJ SOLN
INTRAMUSCULAR | Status: AC
  Filled 2019-06-14: qty 2

## 2019-06-14 MED ORDER — TRANEXAMIC ACID-NACL 1000-0.7 MG/100ML-% IV SOLN
1000.0000 mg | INTRAVENOUS | Status: AC
  Administered 2019-06-14: 1000 mg via INTRAVENOUS
  Filled 2019-06-14: qty 100

## 2019-06-14 MED ORDER — FENTANYL CITRATE (PF) 100 MCG/2ML IJ SOLN
INTRAMUSCULAR | Status: DC | PRN
  Administered 2019-06-14 (×3): 50 ug via INTRAVENOUS
  Administered 2019-06-14: 100 ug via INTRAVENOUS

## 2019-06-14 MED ORDER — ROCURONIUM BROMIDE 10 MG/ML (PF) SYRINGE
PREFILLED_SYRINGE | INTRAVENOUS | Status: DC | PRN
  Administered 2019-06-14: 50 mg via INTRAVENOUS

## 2019-06-14 SURGICAL SUPPLY — 65 items
BIT DRILL 4.3 (BIT) ×2
BIT DRILL 4.3MM (BIT) ×1
BIT DRILL 4.3X300MM (BIT) IMPLANT
BIT DRILL QC 3.3X195 (BIT) ×2 IMPLANT
CABLE CERLAGE W/CRIMP 1.8 (Cable) ×3 IMPLANT
CABLE CERLAGE W/CRIMP 1.8MM (Cable) ×3 IMPLANT
CAP LOCK NCB (Cap) ×8 IMPLANT
COVER WAND RF STERILE (DRAPES) ×3 IMPLANT
CUFF TOURN SGL QUICK 34 (TOURNIQUET CUFF)
CUFF TOURN SGL QUICK 42 (TOURNIQUET CUFF) IMPLANT
CUFF TRNQT CYL 34X4.125X (TOURNIQUET CUFF) IMPLANT
DRAPE C-ARM 42X72 X-RAY (DRAPES) ×3 IMPLANT
DRAPE IMP U-DRAPE 54X76 (DRAPES) ×3 IMPLANT
DRAPE INCISE IOBAN 66X45 STRL (DRAPES) ×3 IMPLANT
DRAPE ORTHO SPLIT 77X108 STRL (DRAPES) ×6
DRAPE SURG 17X23 STRL (DRAPES) ×3 IMPLANT
DRAPE SURG ORHT 6 SPLT 77X108 (DRAPES) ×2 IMPLANT
DRSG ADAPTIC 3X8 NADH LF (GAUZE/BANDAGES/DRESSINGS) ×3 IMPLANT
DRSG AQUACEL AG ADV 3.5X14 (GAUZE/BANDAGES/DRESSINGS) ×2 IMPLANT
DRSG PAD ABDOMINAL 8X10 ST (GAUZE/BANDAGES/DRESSINGS) ×3 IMPLANT
DURAPREP 26ML APPLICATOR (WOUND CARE) ×3 IMPLANT
ELECT CAUTERY BLADE 6.4 (BLADE) ×3 IMPLANT
ELECT REM PT RETURN 9FT ADLT (ELECTROSURGICAL) ×3
ELECTRODE REM PT RTRN 9FT ADLT (ELECTROSURGICAL) ×1 IMPLANT
EVACUATOR 1/8 PVC DRAIN (DRAIN) IMPLANT
GAUZE SPONGE 4X4 12PLY STRL (GAUZE/BANDAGES/DRESSINGS) ×3 IMPLANT
GLOVE BIOGEL PI IND STRL 7.5 (GLOVE) ×1 IMPLANT
GLOVE BIOGEL PI IND STRL 8 (GLOVE) ×1 IMPLANT
GLOVE BIOGEL PI INDICATOR 7.5 (GLOVE) ×2
GLOVE BIOGEL PI INDICATOR 8 (GLOVE) ×2
GLOVE ORTHO TXT STRL SZ7.5 (GLOVE) ×3 IMPLANT
GLOVE SURG ORTHO 8.0 STRL STRW (GLOVE) ×3 IMPLANT
GOWN STRL REUS W/ TWL LRG LVL3 (GOWN DISPOSABLE) ×3 IMPLANT
GOWN STRL REUS W/TWL LRG LVL3 (GOWN DISPOSABLE) ×9
K-WIRE 2.0 (WIRE) ×3
K-WIRE FXSTD 280X2XNS SS (WIRE) ×1
KIT BASIN OR (CUSTOM PROCEDURE TRAY) ×3 IMPLANT
KIT TURNOVER KIT B (KITS) ×3 IMPLANT
KWIRE FXSTD 280X2XNS SS (WIRE) IMPLANT
MANIFOLD NEPTUNE II (INSTRUMENTS) ×3 IMPLANT
NS IRRIG 1000ML POUR BTL (IV SOLUTION) ×3 IMPLANT
PACK UNIVERSAL I (CUSTOM PROCEDURE TRAY) ×3 IMPLANT
PAD ARMBOARD 7.5X6 YLW CONV (MISCELLANEOUS) ×6 IMPLANT
PLATE DISTAL FEMUR 15H 317M RT (Plate) ×2 IMPLANT
SCREW 5.0 48MM (Screw) ×2 IMPLANT
SCREW 5.0 70MM (Screw) ×2 IMPLANT
SCREW CORT NCB SELFTAP 5.0X50 (Screw) ×2 IMPLANT
SCREW CORTICAL NCB 5.0X65 (Screw) ×2 IMPLANT
SCREW NCB 3.5X75X5X6.2XST (Screw) IMPLANT
SCREW NCB 5.0X36MM (Screw) ×4 IMPLANT
SCREW NCB 5.0X55MM (Screw) ×2 IMPLANT
SCREW NCB 5.0X75MM (Screw) ×3 IMPLANT
STAPLER VISISTAT 35W (STAPLE) ×3 IMPLANT
SUT MNCRL AB 3-0 PS2 18 (SUTURE) ×2 IMPLANT
SUT MNCRL AB 3-0 PS2 27 (SUTURE) ×2 IMPLANT
SUT VIC AB 0 CT1 27 (SUTURE) ×9
SUT VIC AB 0 CT1 27XBRD ANBCTR (SUTURE) ×3 IMPLANT
SUT VIC AB 1 CT1 27 (SUTURE) ×6
SUT VIC AB 1 CT1 27XBRD ANBCTR (SUTURE) ×1 IMPLANT
SUT VIC AB 2-0 CT1 27 (SUTURE) ×6
SUT VIC AB 2-0 CT1 TAPERPNT 27 (SUTURE) ×1 IMPLANT
SUT VLOC 180 0 6IN GS21 (SUTURE) ×2 IMPLANT
TOWEL GREEN STERILE (TOWEL DISPOSABLE) ×3 IMPLANT
TOWEL GREEN STERILE FF (TOWEL DISPOSABLE) ×3 IMPLANT
WATER STERILE IRR 1000ML POUR (IV SOLUTION) ×3 IMPLANT

## 2019-06-14 NOTE — Progress Notes (Signed)
Pt arrived to floor approximately 2330 last night. She refused to allow staff to remove her clothing and extra linen or move her in any way because of pain in her right leg/hip area, consequently I was unable to perform skin assessment or pre op skin assessment. Will attempt  later when patient has better pain control .

## 2019-06-14 NOTE — Consult Note (Signed)
Reason for Consult: right femur fracture below right hip prosthesis Referring Physician: Lupita Leash, MD (Hospitalist)  [name] is an 73 y.o. female.  HPI: [name] is a 73 y.o. female with medical history significant of breast cancer status post right mastectomy, COPD with current tobacco use, atrial fibrillation not on anticoagulation, hypertension, osteo-arthritis, chronic back pain due to degenerative disc disorder who presented status post a fall and right hip and knee pain.  Patient was working in the garden this afternoon moving large bags of dirt when she tripped on a stone and landed on her right knee onto the gardening stones.  Patient states that she heard a crack and also felt movement around her knee.  She laid there for 20 minutes until she was found by her neighbor who informed her husband and called EMS.  Denies any chest pain or shortness of breath.  I was consulted based on my history with her and prior hip arthroplasties   Past Medical History:  Diagnosis Date  . Asthma   . Breast cancer ([place])    right breast  . COPD (chronic obstructive pulmonary disease) ([place])   . Coronary artery disease   . Diverticulum of esophagus   . Elevated LFTs   . Emphysema lung ([place])   . ETOH abuse   . H/O atrial fibrillation without current medication    only one time when she had sepsis  . Hyperlipidemia   . Hypertension    hx of but not on any medications  . Myocardial infarction ([place]) 2000  . OA (osteoarthritis) of knee   . Osteoarthritis   . Tobacco abuse     Past Surgical History:  Procedure Laterality Date  . ANTERIOR HIP REVISION Right 10/22/2015   Procedure: RIGHT  HIP REVISION;  Surgeon: Paralee Cancel, MD;  Location: [facility];  Service: Orthopedics;  Laterality: Right;  . APPLICATION OF WOUND VAC N/A 06/20/2015   Procedure: APPLICATION OF INCISIONAL WOUND VAC;  Surgeon: Melina Schools, MD;  Location: [place];  Service: Orthopedics;  Laterality: N/A;  . BREAST SURGERY  1991    right mastectomy  . CARDIAC CATHETERIZATION  04/05/2009   EF 60%  . CARDIOVASCULAR STRESS TEST  01/31/2005   EF 58%  . CESAREAN SECTION  '78, '80, '81   x 3  . CORONARY ANGIOPLASTY  08/1998   x2 OF A BIFURCATION OM-1, OM-2 LESION  . CORONARY ANGIOPLASTY WITH STENT PLACEMENT  01/1999   MID FIRST OBTUSE MARGINAL VESSEL  . CORONARY ANGIOPLASTY WITH STENT PLACEMENT  07/1999   STENTING AT THE CRUX OF THE RIGHT CORONARY ARTERY WITH A 3.8MM X 18MM TETRA STENT  . DIRECT LARYNGOSCOPY N/A 05/03/2015   Procedure: DIRECT LARYNGOSCOPY;  Surgeon: Jodi Marble, MD;  Location: [facility];  Service: ENT;  Laterality: N/A;  . EYE SURGERY  05/18/2014,06/01/2014   BILATERAL CATARACT S WITH LENS IMPLANTS  . GASTROSTOMY N/A 05/04/2015   Procedure: OPEN GASTROSTOMY WITH TUBE PLACEMENT;  Surgeon: Donnie Mesa, MD;  Location: [facility];  Service: General;  Laterality: N/A;  . GASTROSTOMY N/A 11/13/2016   Procedure: INSERTION OF GASTROSTOMY TUBE;  Surgeon: Coralie Keens, MD;  Location: [place];  Service: General;  Laterality: N/A;  . HARDWARE REMOVAL N/A 05/03/2015   Procedure: HARDWARE REMOVAL;  Surgeon: Melina Schools, MD;  Location: [place];  Service: Orthopedics;  Laterality: N/A;  . HIP CLOSED REDUCTION Right 04/26/2015   Procedure: CLOSED REDUCTION HIP;  Surgeon: Melina Schools, MD;  Location: [facility];  Service: Orthopedics;  Laterality: Right;  . HYSTEROSCOPY     D & C  . INCISION AND DRAINAGE ABSCESS N/A 05/03/2015   Procedure: INCISION AND DRAINAGE CERVICAL  ABSCESS AND REMOVAL OF HARDWARE;  Surgeon: Melina Schools, MD;  Location: [place];  Service: Orthopedics;  Laterality: N/A;  . IR CM INJ ANY COLONIC TUBE W/FLUORO  10/14/2017  . IR CM INJ ANY COLONIC TUBE W/FLUORO  10/23/2017  . IR CM INJ ANY COLONIC TUBE W/FLUORO  10/28/2017  . IR [place] PERCUT W/FLUORO  09/22/2017  . JOINT REPLACEMENT  08/2011   bilateral hip  . JOINT REPLACEMENT  01/2012   right hip  . MASTECTOMY    . neck fusion  2011  . PELVIC  LAPAROSCOPY  2002   RSO-    . RADICAL NECK DISSECTION N/A 11/08/2016   Procedure: INCISION AND DRAINAGE OF NECK ABSCESS;  Surgeon: Jerrell Belfast, MD;  Location: [place];  Service: ENT;  Laterality: N/A;  . RADIOLOGY WITH ANESTHESIA Right 06/28/2015   Procedure: MRI OF CERVICAL SPINE  AND RIGHT HIP  WITH AND WITHOUT CONTRAST    (RADIOLOGY WITH ANESTHESIA);  Surgeon: Medication Radiologist, MD;  Location: [place];  Service: Radiology;  Laterality: Right;  . REMOVAL OF GASTROSTOMY TUBE N/A 11/14/2016   Procedure: REMOVAL OF GASTROSTOMY TUBE W/ REPLACEMENT OF GASTROSTOMY TUBE;  Surgeon: Coralie Keens, MD;  Location: [facility];  Service: General;  Laterality: N/A;  . RIGID ESOPHAGOSCOPY N/A 05/03/2015   Procedure: RIGID ESOPHAGOSCOPY;  Surgeon: Jodi Marble, MD;  Location: [facility];  Service: ENT;  Laterality: N/A;  . TONSILLECTOMY AND ADENOIDECTOMY    . TOTAL HIP ARTHROPLASTY  08/2010   bilat  . VULVECTOMY  1981   partial    Family History  Problem Relation Age of Onset  . Diabetes Mother   . Hypertension Father   . Heart disease Father   . Heart attack Father   . Stroke Father     Social History:  reports that she has quit smoking. Her smoking use included cigarettes. She has a 50.00 pack-year smoking history. She has never used smokeless tobacco. She reports that she does not drink alcohol or use drugs.  Allergies:  Allergies  Allergen Reactions  . Cephalexin Other (See Comments)    Took off first layer of skin inside of mouth  . Chlorhexidine   . Zithromax [Azithromycin Dihydrate] Other (See Comments)    ORAL ULCERS     Medications:  I have reviewed the patient's current medications. Scheduled: . enoxaparin (LOVENOX) injection  40 mg Subcutaneous Daily  . furosemide  20 mg Oral Daily  . gabapentin  600 mg Oral TID AC & HS  . mometasone-formoterol  2 puff Inhalation BID  . morphine  30 mg Oral QID  . oxyCODONE  15 mg Oral QID    Results for orders placed or performed during the  hospital encounter of 06/13/19 (from the past 24 hour(s))  Basic metabolic panel     Status: Abnormal   Collection Time: 06/13/19  7:06 PM  Result Value Ref Range   Sodium 133 (L) 135 - 145 mmol/L   Potassium 3.3 (L) 3.5 - 5.1 mmol/L   Chloride 97 (L) 98 - 111 mmol/L   CO2 25 22 - 32 mmol/L   Glucose, Bld 151 (H) 70 - 99 mg/dL   BUN 14 8 - 23 mg/dL   Creatinine, Ser 0.81 0.44 - 1.00 mg/dL   Calcium 8.7 (L) 8.9 - 10.3 mg/dL   GFR  calc non Af Amer >60 >60 mL/min   GFR calc Af Amer >60 >60 mL/min   Anion gap 11 5 - 15  CBC with Differential     Status: Abnormal   Collection Time: 06/13/19  7:06 PM  Result Value Ref Range   WBC 12.9 (H) 4.0 - 10.5 K/uL   RBC 4.47 3.87 - 5.11 MIL/uL   Hemoglobin 13.9 12.0 - 15.0 g/dL   HCT 39.8 36.0 - 46.0 %   MCV 89.0 80.0 - 100.0 fL   MCH 31.1 26.0 - 34.0 pg   MCHC 34.9 30.0 - 36.0 g/dL   RDW 14.3 11.5 - 15.5 %   Platelets 208 150 - 400 K/uL   nRBC 0.0 0.0 - 0.2 %   Neutrophils Relative % 82 %   Neutro Abs 10.5 (H) 1.7 - 7.7 K/uL   Lymphocytes Relative 12 %   Lymphs Abs 1.5 0.7 - 4.0 K/uL   Monocytes Relative 6 %   Monocytes Absolute 0.7 0.1 - 1.0 K/uL   Eosinophils Relative 0 %   Eosinophils Absolute 0.0 0.0 - 0.5 K/uL   Basophils Relative 0 %   Basophils Absolute 0.0 0.0 - 0.1 K/uL   Immature Granulocytes 0 %   Abs Immature Granulocytes 0.05 0.00 - 0.07 K/uL  Protime-INR     Status: None   Collection Time: 06/13/19  7:06 PM  Result Value Ref Range   Prothrombin Time 13.3 11.4 - 15.2 seconds   INR 1.0 0.8 - 1.2  SARS Coronavirus 2 [facility] order, Performed in [place] hospital lab) Nasopharyngeal Nasopharyngeal Swab     Status: None   Collection Time: 06/13/19  7:29 PM   Specimen: Nasopharyngeal Swab  Result Value Ref Range   SARS Coronavirus 2 NEGATIVE NEGATIVE  CBC     Status: Abnormal   Collection Time: 06/13/19  9:13 PM  Result Value Ref Range   WBC 11.2 (H) 4.0 - 10.5 K/uL   RBC 4.53 3.87 - 5.11 MIL/uL   Hemoglobin 14.1  12.0 - 15.0 g/dL   HCT 40.0 36.0 - 46.0 %   MCV 88.3 80.0 - 100.0 fL   MCH 31.1 26.0 - 34.0 pg   MCHC 35.3 30.0 - 36.0 g/dL   RDW 14.4 11.5 - 15.5 %   Platelets 205 150 - 400 K/uL   nRBC 0.0 0.0 - 0.2 %  Creatinine, serum     Status: None   Collection Time: 06/13/19  9:13 PM  Result Value Ref Range   Creatinine, Ser 0.74 0.44 - 1.00 mg/dL   GFR calc non Af Amer >60 >60 mL/min   GFR calc Af Amer >60 >60 mL/min    X-ray: CLINICAL DATA:  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 Pt stated that she was moving bags of dirt in her yard today, when she tripped over a stone and fell on her right knee. Pt said she felt something move in her knee when she fell on it.  EXAM: DG HIP (WITH OR WITHOUT PELVIS) 2-3V RIGHT  COMPARISON:  Hip radiographs 10/22/2015  FINDINGS: Limited evaluation of hip joint alignment given the patient's positioning. The arthroplasty hardware appears intact. There is a large displaced spiral fracture involving the mid shaft of the right femur spanning approximately 8.1 cm.  IMPRESSION: 1. Large displaced spiral fracture involving the mid shaft of the right femur spanning approximately 8.1 cm. 2. Limited evaluation of the right hip joint alignment due to patient positioning. The right hip hardware appears intact.  Electronically Signed   By: Audie Pinto M.D  ROS   Constitutional: No Weight Change, No Fever ENT/Mouth: No sore throat, No Rhinorrhea Eyes: No Eye Pain, No Vision Changes Cardiovascular: No Chest Pain, no SOB Respiratory: No Cough, No Sputum, No Wheezing, no Dyspnea  Gastrointestinal: No Nausea, No Vomiting, No Diarrhea, No Constipation, No Pain Genitourinary: no Urinary Incontinence, No Urgency, No Flank Pain Musculoskeletal: No Arthralgias, No Myalgias Skin: No Skin Lesions, No Pruritus, Neuro: no Weakness, No Numbness,  No Loss of Consciousness, No Syncope Psych: No Anxiety/Panic, No Depression, no decrease  appetite Heme/Lymph: No Bruising, No Bleeding Blood pressure (!) 157/80, pulse 80, temperature 98.1 F (36.7 C), temperature source Oral, resp. rate 18, height 5\' 2"  (1.575 m), weight 68 kg, SpO2 95 %.  Physical Exam:  Eyes: PERRL, lids and conjunctivae normal ENMT: Mucous membranes are moist. Posterior pharynx clear of any exudate or lesions.Normal dentition.  Neck: normal, supple, no masses Respiratory: Decreased aeration with mild expiratory wheezes throughout, no crackles. Normal respiratory effort. No accessory muscle use. On room air.  Cardiovascular: Regular rate and rhythm, no murmurs / rubs / gallops. No extremity edema. 2+ pedal pulses. No carotid bruits.  Abdomen: Mild epigastric tenderness, no masses palpated.  Bowel sounds positive.  Musculoskeletal: Shortened, externally rotated RLE secondary to femur fracture Unable to assess range of motion of the lower extremity given patient's intolerance to pain secondary fracture.  2+ pedal pulses and intact sensation of the lower extremity. Skin: no rashes, lesions, ulcers. No induration Neurologic: CN 2-12 grossly intact. Sensation intact, unable to assess lower extremity strength secondary to intolerance to pain. Psychiatric: Normal judgment and insight. Alert and oriented x 3. Normal mood.    Assessment/Plan: Right closed periprosthetic femur fracture  Plan: Will need ORIF of femur NPO Orders placed and on schedule for ORIF this afternoon Post op course will include limited weight bearing for 6-8 weeks to allow for fracture healing  Mauri Pole 06/14/2019, 7:29 AM

## 2019-06-14 NOTE — Interval H&P Note (Signed)
History and Physical Interval Note:  06/14/2019 4:12 PM  [name]  has presented today for surgery, with the diagnosis of right prosthetic femur fracture.  The various methods of treatment have been discussed with the patient and family. After consideration of risks, benefits and other options for treatment, the patient has consented to  Procedure(s): ORIF PERI PROSTHETIC FEMUR FRACTURE (Right) as a surgical intervention.  The patient's history has been reviewed, patient examined, no change in status, stable for surgery.  I have reviewed the patient's chart and labs.  Questions were answered to the patient's satisfaction.     Mauri Pole

## 2019-06-14 NOTE — Brief Op Note (Signed)
06/13/2019 - 06/14/2019  4:14 PM  PATIENT:  Eunice Blase  73 y.o. female  PRE-OPERATIVE DIAGNOSIS:  right prosthetic femur fracture  POST-OPERATIVE DIAGNOSIS:  right prosthetic femur fracture  PROCEDURE:  Procedure(s): ORIF PERI PROSTHETIC FEMUR FRACTURE (Right)  SURGEON:  Surgeon(s) and Role:    Paralee Cancel, MD - Primary  PHYSICIAN ASSISTANT: Griffith Citron, PA-C  ANESTHESIA:   general  EBL:  300 cc  BLOOD ADMINISTERED:none  DRAINS: none   LOCAL MEDICATIONS USED:  NONE  SPECIMEN:  No Specimen  DISPOSITION OF SPECIMEN:  N/A  COUNTS:  YES  TOURNIQUET:  * No tourniquets in log *  DICTATION: .Other Dictation: Dictation Number [id number]  PLAN OF CARE: Admit to inpatient   PATIENT DISPOSITION:  PACU - hemodynamically stable.   Delay start of Pharmacological VTE agent (>24hrs) due to surgical blood loss or risk of bleeding: no

## 2019-06-14 NOTE — Anesthesia Preprocedure Evaluation (Addendum)
Anesthesia Evaluation  Patient identified by MRN, date of birth, ID band Patient awake    Reviewed: Allergy & Precautions, H&P , NPO status , Patient's Chart, lab work & pertinent test results  Airway Mallampati: II  TM Distance: >3 FB Neck ROM: Full    Dental  (+) Edentulous Upper, Edentulous Lower   Pulmonary asthma , COPD,  COPD inhaler, Current Smoker and Patient abstained from smoking.,  50 pack year history COPD: albuterol PRN and dulera   Pulmonary exam normal breath sounds clear to auscultation       Cardiovascular hypertension, Pt. on medications + CAD and + Past MI  + dysrhythmias Atrial Fibrillation  Rhythm:Regular Rate:Normal  2015: - Left ventricle: The cavity size was normal. Wall thickness was  increased in a pattern of mild LVH. Systolic function was normal.  The estimated ejection fraction was in the range of 60% to 65%.  - Mitral valve: Poorly visualized in apical views. MAC wtih  restricted posterior leaflet motion and likely moderate MR.  - Left atrium: The atrium was mildly dilated.  - Pulmonary arteries: PA peak pressure: 39 mm Hg (S).  - Pericardium, extracardiac: Likely large epicardial fat pad and  small apical effusion Consider CT to further assess pericardial  space.  - Impressions: Overall image quality is poor with non diagnostic  apical views.    Neuro/Psych negative neurological ROS  negative psych ROS   GI/Hepatic negative GI ROS, (+)     substance abuse  alcohol use,   Endo/Other  negative endocrine ROSDiabetes: denies.  Renal/GU negative Renal ROS  negative genitourinary   Musculoskeletal  (+) Arthritis , Osteoarthritis,  narcotic dependentChronic LBP   Abdominal Normal abdominal exam  (+)   Peds  Hematology negative hematology ROS (+)   Anesthesia Other Findings S/p fall while gardening now with R femoral shaft fx  Reproductive/Obstetrics negative OB  ROS Breast ca s/p R mastectomy                          Anesthesia Physical  Anesthesia Plan  ASA: III  Anesthesia Plan: General   Post-op Pain Management:    Induction: Intravenous  PONV Risk Score and Plan: 3 and Ondansetron, Dexamethasone and Treatment may vary due to age or medical condition  Airway Management Planned: Oral ETT  Additional Equipment: None  Intra-op Plan:   Post-operative Plan: Extubation in OR  Informed Consent: I have reviewed the patients History and Physical, chart, labs and discussed the procedure including the risks, benefits and alternatives for the proposed anesthesia with the patient or authorized representative who has indicated his/her understanding and acceptance.     Dental advisory given  Plan Discussed with: CRNA  Anesthesia Plan Comments:      Anesthesia Quick Evaluation

## 2019-06-14 NOTE — Transfer of Care (Signed)
Immediate Anesthesia Transfer of Care Note  Patient: TEODORA BASIC  Procedure(s) Performed: ORIF PERI PROSTHETIC FEMUR FRACTURE (Right )  Patient Location: PACU  Anesthesia Type:General  Level of Consciousness: awake, alert , oriented and patient cooperative  Airway & Oxygen Therapy: Patient Spontanous Breathing and Patient connected to nasal cannula oxygen  Post-op Assessment: Report given to RN and Post -op Vital signs reviewed and stable  Post vital signs: Reviewed and stable  Last Vitals:  Vitals Value Taken Time  BP 164/89 06/14/19 1902  Temp    Pulse 83 06/14/19 1903  Resp 17 06/14/19 1903  SpO2 100 % 06/14/19 1903  Vitals shown include unvalidated device data.  Last Pain:  Vitals:   06/14/19 1407  TempSrc: Oral  PainSc:          Complications: No apparent anesthesia complications

## 2019-06-14 NOTE — H&P (View-Only) (Signed)
Reason for Consult: right femur fracture below right hip prosthesis Referring Physician: Lupita Leash, MD (Hospitalist)  [name] is an 73 y.o. female.  HPI: [name] is a 74 y.o. female with medical history significant of breast cancer status post right mastectomy, COPD with current tobacco use, atrial fibrillation not on anticoagulation, hypertension, osteo-arthritis, chronic back pain due to degenerative disc disorder who presented status post a fall and right hip and knee pain.  Patient was working in the garden this afternoon moving large bags of dirt when she tripped on a stone and landed on her right knee onto the gardening stones.  Patient states that she heard a crack and also felt movement around her knee.  She laid there for 20 minutes until she was found by her neighbor who informed her husband and called EMS.  Denies any chest pain or shortness of breath.  I was consulted based on my history with her and prior hip arthroplasties   Past Medical History:  Diagnosis Date  . Asthma   . Breast cancer ([place])    right breast  . COPD (chronic obstructive pulmonary disease) ([place])   . Coronary artery disease   . Diverticulum of esophagus   . Elevated LFTs   . Emphysema lung ([place])   . ETOH abuse   . H/O atrial fibrillation without current medication    only one time when she had sepsis  . Hyperlipidemia   . Hypertension    hx of but not on any medications  . Myocardial infarction ([place]) 2000  . OA (osteoarthritis) of knee   . Osteoarthritis   . Tobacco abuse     Past Surgical History:  Procedure Laterality Date  . ANTERIOR HIP REVISION Right 10/22/2015   Procedure: RIGHT  HIP REVISION;  Surgeon: Paralee Cancel, MD;  Location: [facility];  Service: Orthopedics;  Laterality: Right;  . APPLICATION OF WOUND VAC N/A 06/20/2015   Procedure: APPLICATION OF INCISIONAL WOUND VAC;  Surgeon: Melina Schools, MD;  Location: [facility];  Service: Orthopedics;  Laterality: N/A;  . BREAST SURGERY  1991    right mastectomy  . CARDIAC CATHETERIZATION  04/05/2009   EF 60%  . CARDIOVASCULAR STRESS TEST  01/31/2005   EF 58%  . CESAREAN SECTION  '78, '80, '81   x 3  . CORONARY ANGIOPLASTY  08/1998   x2 OF A BIFURCATION OM-1, OM-2 LESION  . CORONARY ANGIOPLASTY WITH STENT PLACEMENT  01/1999   MID FIRST OBTUSE MARGINAL VESSEL  . CORONARY ANGIOPLASTY WITH STENT PLACEMENT  07/1999   STENTING AT THE CRUX OF THE RIGHT CORONARY ARTERY WITH A 3.8MM X 18MM TETRA STENT  . DIRECT LARYNGOSCOPY N/A 05/03/2015   Procedure: DIRECT LARYNGOSCOPY;  Surgeon: Jodi Marble, MD;  Location: [facility];  Service: ENT;  Laterality: N/A;  . EYE SURGERY  05/18/2014,06/01/2014   BILATERAL CATARACT S WITH LENS IMPLANTS  . GASTROSTOMY N/A 05/04/2015   Procedure: OPEN GASTROSTOMY WITH TUBE PLACEMENT;  Surgeon: Donnie Mesa, MD;  Location: [facility];  Service: General;  Laterality: N/A;  . GASTROSTOMY N/A 11/13/2016   Procedure: INSERTION OF GASTROSTOMY TUBE;  Surgeon: Coralie Keens, MD;  Location: [facility];  Service: General;  Laterality: N/A;  . HARDWARE REMOVAL N/A 05/03/2015   Procedure: HARDWARE REMOVAL;  Surgeon: Melina Schools, MD;  Location: [facility];  Service: Orthopedics;  Laterality: N/A;  . HIP CLOSED REDUCTION Right 04/26/2015   Procedure: CLOSED REDUCTION HIP;  Surgeon: Melina Schools, MD;  Location[facility];  Service: Orthopedics;  Laterality: Right;  . HYSTEROSCOPY     D & C  . INCISION AND DRAINAGE ABSCESS N/A 05/03/2015   Procedure: INCISION AND DRAINAGE CERVICAL  ABSCESS AND REMOVAL OF HARDWARE;  Surgeon: Melina Schools, MD;  Location: [facility];  Service: Orthopedics;  Laterality: N/A;  . IR CM INJ ANY COLONIC TUBE W/FLUORO  10/14/2017  . IR CM INJ ANY COLONIC TUBE W/FLUORO  10/23/2017  . IR CM INJ ANY COLONIC TUBE W/FLUORO  10/28/2017  . IR [place] PERCUT W/FLUORO  09/22/2017  . JOINT REPLACEMENT  08/2011   bilateral hip  . JOINT REPLACEMENT  01/2012   right hip  . MASTECTOMY    . neck fusion  2011  . PELVIC  LAPAROSCOPY  2002   RSO-    . RADICAL NECK DISSECTION N/A 11/08/2016   Procedure: INCISION AND DRAINAGE OF NECK ABSCESS;  Surgeon: Jerrell Belfast, MD;  Location: [facility];  Service: ENT;  Laterality: N/A;  . RADIOLOGY WITH ANESTHESIA Right 06/28/2015   Procedure: MRI OF CERVICAL SPINE  AND RIGHT HIP  WITH AND WITHOUT CONTRAST    (RADIOLOGY WITH ANESTHESIA);  Surgeon: Medication Radiologist, MD;  Location: [facility];  Service: Radiology;  Laterality: Right;  . REMOVAL OF GASTROSTOMY TUBE N/A 11/14/2016   Procedure: REMOVAL OF GASTROSTOMY TUBE W/ REPLACEMENT OF GASTROSTOMY TUBE;  Surgeon: Coralie Keens, MD;  Location: [facility];  Service: General;  Laterality: N/A;  . RIGID ESOPHAGOSCOPY N/A 05/03/2015   Procedure: RIGID ESOPHAGOSCOPY;  Surgeon: Jodi Marble, MD;  Location: [facility];  Service: ENT;  Laterality: N/A;  . TONSILLECTOMY AND ADENOIDECTOMY    . TOTAL HIP ARTHROPLASTY  08/2010   bilat  . VULVECTOMY  1981   partial    Family History  Problem Relation Age of Onset  . Diabetes Mother   . Hypertension Father   . Heart disease Father   . Heart attack Father   . Stroke Father     Social History:  reports that she has quit smoking. Her smoking use included cigarettes. She has a 50.00 pack-year smoking history. She has never used smokeless tobacco. She reports that she does not drink alcohol or use drugs.  Allergies:  Allergies  Allergen Reactions  . Cephalexin Other (See Comments)    Took off first layer of skin inside of mouth  . Chlorhexidine   . Zithromax [Azithromycin Dihydrate] Other (See Comments)    ORAL ULCERS     Medications:  I have reviewed the patient's current medications. Scheduled: . enoxaparin (LOVENOX) injection  40 mg Subcutaneous Daily  . furosemide  20 mg Oral Daily  . gabapentin  600 mg Oral TID AC & HS  . mometasone-formoterol  2 puff Inhalation BID  . morphine  30 mg Oral QID  . oxyCODONE  15 mg Oral QID    Results for orders placed or performed during the  hospital encounter of 06/13/19 (from the past 24 hour(s))  Basic metabolic panel     Status: Abnormal   Collection Time: 06/13/19  7:06 PM  Result Value Ref Range   Sodium 133 (L) 135 - 145 mmol/L   Potassium 3.3 (L) 3.5 - 5.1 mmol/L   Chloride 97 (L) 98 - 111 mmol/L   CO2 25 22 - 32 mmol/L   Glucose, Bld 151 (H) 70 - 99 mg/dL   BUN 14 8 - 23 mg/dL   Creatinine, Ser 0.81 0.44 - 1.00 mg/dL   Calcium 8.7 (L) 8.9 - 10.3 mg/dL   GFR  calc non Af Amer >60 >60 mL/min   GFR calc Af Amer >60 >60 mL/min   Anion gap 11 5 - 15  CBC with Differential     Status: Abnormal   Collection Time: 06/13/19  7:06 PM  Result Value Ref Range   WBC 12.9 (H) 4.0 - 10.5 K/uL   RBC 4.47 3.87 - 5.11 MIL/uL   Hemoglobin 13.9 12.0 - 15.0 g/dL   HCT 39.8 36.0 - 46.0 %   MCV 89.0 80.0 - 100.0 fL   MCH 31.1 26.0 - 34.0 pg   MCHC 34.9 30.0 - 36.0 g/dL   RDW 14.3 11.5 - 15.5 %   Platelets 208 150 - 400 K/uL   nRBC 0.0 0.0 - 0.2 %   Neutrophils Relative % 82 %   Neutro Abs 10.5 (H) 1.7 - 7.7 K/uL   Lymphocytes Relative 12 %   Lymphs Abs 1.5 0.7 - 4.0 K/uL   Monocytes Relative 6 %   Monocytes Absolute 0.7 0.1 - 1.0 K/uL   Eosinophils Relative 0 %   Eosinophils Absolute 0.0 0.0 - 0.5 K/uL   Basophils Relative 0 %   Basophils Absolute 0.0 0.0 - 0.1 K/uL   Immature Granulocytes 0 %   Abs Immature Granulocytes 0.05 0.00 - 0.07 K/uL  Protime-INR     Status: None   Collection Time: 06/13/19  7:06 PM  Result Value Ref Range   Prothrombin Time 13.3 11.4 - 15.2 seconds   INR 1.0 0.8 - 1.2  SARS Coronavirus 2 [facility] order, Performed in [place] hospital lab) Nasopharyngeal Nasopharyngeal Swab     Status: None   Collection Time: 06/13/19  7:29 PM   Specimen: Nasopharyngeal Swab  Result Value Ref Range   SARS Coronavirus 2 NEGATIVE NEGATIVE  CBC     Status: Abnormal   Collection Time: 06/13/19  9:13 PM  Result Value Ref Range   WBC 11.2 (H) 4.0 - 10.5 K/uL   RBC 4.53 3.87 - 5.11 MIL/uL   Hemoglobin 14.1  12.0 - 15.0 g/dL   HCT 40.0 36.0 - 46.0 %   MCV 88.3 80.0 - 100.0 fL   MCH 31.1 26.0 - 34.0 pg   MCHC 35.3 30.0 - 36.0 g/dL   RDW 14.4 11.5 - 15.5 %   Platelets 205 150 - 400 K/uL   nRBC 0.0 0.0 - 0.2 %  Creatinine, serum     Status: None   Collection Time: 06/13/19  9:13 PM  Result Value Ref Range   Creatinine, Ser 0.74 0.44 - 1.00 mg/dL   GFR calc non Af Amer >60 >60 mL/min   GFR calc Af Amer >60 >60 mL/min    X-ray: CLINICAL DATA:  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 Pt stated that she was moving bags of dirt in her yard today, when she tripped over a stone and fell on her right knee. Pt said she felt something move in her knee when she fell on it.  EXAM: DG HIP (WITH OR WITHOUT PELVIS) 2-3V RIGHT  COMPARISON:  Hip radiographs 10/22/2015  FINDINGS: Limited evaluation of hip joint alignment given the patient's positioning. The arthroplasty hardware appears intact. There is a large displaced spiral fracture involving the mid shaft of the right femur spanning approximately 8.1 cm.  IMPRESSION: 1. Large displaced spiral fracture involving the mid shaft of the right femur spanning approximately 8.1 cm. 2. Limited evaluation of the right hip joint alignment due to patient positioning. The right hip hardware appears intact.  Electronically Signed   By: Audie Pinto M.D  ROS   Constitutional: No Weight Change, No Fever ENT/Mouth: No sore throat, No Rhinorrhea Eyes: No Eye Pain, No Vision Changes Cardiovascular: No Chest Pain, no SOB Respiratory: No Cough, No Sputum, No Wheezing, no Dyspnea  Gastrointestinal: No Nausea, No Vomiting, No Diarrhea, No Constipation, No Pain Genitourinary: no Urinary Incontinence, No Urgency, No Flank Pain Musculoskeletal: No Arthralgias, No Myalgias Skin: No Skin Lesions, No Pruritus, Neuro: no Weakness, No Numbness,  No Loss of Consciousness, No Syncope Psych: No Anxiety/Panic, No Depression, no decrease  appetite Heme/Lymph: No Bruising, No Bleeding Blood pressure (!) 157/80, pulse 80, temperature 98.1 F (36.7 C), temperature source Oral, resp. rate 18, height 5\' 2"  (1.575 m), weight 68 kg, SpO2 95 %.  Physical Exam:  Eyes: PERRL, lids and conjunctivae normal ENMT: Mucous membranes are moist. Posterior pharynx clear of any exudate or lesions.Normal dentition.  Neck: normal, supple, no masses Respiratory: Decreased aeration with mild expiratory wheezes throughout, no crackles. Normal respiratory effort. No accessory muscle use. On room air.  Cardiovascular: Regular rate and rhythm, no murmurs / rubs / gallops. No extremity edema. 2+ pedal pulses. No carotid bruits.  Abdomen: Mild epigastric tenderness, no masses palpated.  Bowel sounds positive.  Musculoskeletal: Shortened, externally rotated RLE secondary to femur fracture Unable to assess range of motion of the lower extremity given patient's intolerance to pain secondary fracture.  2+ pedal pulses and intact sensation of the lower extremity. Skin: no rashes, lesions, ulcers. No induration Neurologic: CN 2-12 grossly intact. Sensation intact, unable to assess lower extremity strength secondary to intolerance to pain. Psychiatric: Normal judgment and insight. Alert and oriented x 3. Normal mood.    Assessment/Plan: Right closed periprosthetic femur fracture  Plan: Will need ORIF of femur NPO Orders placed and on schedule for ORIF this afternoon Post op course will include limited weight bearing for 6-8 weeks to allow for fracture healing  Mauri Pole 06/14/2019, 7:29 AM

## 2019-06-14 NOTE — Progress Notes (Signed)
PROGRESS NOTE    [name]  ZOX:[id number] DOB: Mar 05, 1946 DOA: 06/13/2019 PCP: Ileana Ladd, MD   Brief Narrative: As per H&P: 73 y.o. female with medical history significant of breast cancer status post right mastectomy, COPD with current tobacco use, atrial fibrillation not on anticoagulation, hypertension, osteo-arthritis, chronic back pain due to degenerative disc disorder who presented status post a fall and right hip and knee pain.  Patient was working in the garden this afternoon moving large bags of dirt when she tripped on a stone and landed on her right knee onto the gardening stones.  Patient states that she heard a crack and also felt movement around her knee.  She laid there for 20 minutes until she was found by her neighbor who informed her husband and called EMS.  Denies any chest pain or shortness of breath.  In [id number] and normotensive on room air.  She was in visible discomfort secondary to pain.  CBC shows mild leukocytosis of 12.9.   Right knee x-ray shows partially visualized spiral fracture of the right femoral shaft.  Serpiginous sclerotic lesion in the distal femur and proximal tibia may represent bone infarct.  Hip x-ray showed large displaced spiral fracture involving the mid shaft of the right femur spanning approximately 8.1 cm.  Right hip hardware appears intact.  Subjective: No acute events overnight. C/o ongoing pain Wants to pee- has catheter. On chronic pain due to back issues/djd  Assessment & Plan:  Closed fracture of shaft of right femur, spiral fracture of the midshaft of the right femur status post fall: Appreciate orthopedic input for operative management this afternoon.  Continue DVT prophylaxis pain control PT OT postop as per orthopedic.  Chronic opiate use/chronic pain  Due to back isssues she says.patient on oxycodone 15 mg 4 times daily, morphine MS Contin 30 mg 4 times daily, Neurontin 600 mg 4 times daily at home along with Xanax 0.5  as needed: Continue IV/p.o. opiates to manage her acute pain as well as chronic pain.  COPD not exacerbation: Continue nebulizer as well as Dulera.  Hypertension: BP fairly controlled, on Lasix. Active smoking- 1ppd- added nicotnine patch. Counseled.  Body mass index is 27.42 kg/m.    DVT prophylaxis: SCD FOR NOW Code Status: FULL Family Communication: plan of care discussed with patient in detail. Will update family Disposition Plan: Remains inpatient pending clinical improvement.  Consultants:  Orthopedics  Procedures:  Radiology:  Dg Knee 2 Views Right  IMPRESSION: 1.  Partially visualized spiral fracture of the right femoral shaft. 2. Serpiginous sclerotic lesions in the distal femur and proximal tibia may represent bony infarct. Electronically Signed   By: Emmaline Kluver M.D.   On: 06/13/2019 19:03   Dg Hip Unilat W Or Wo Pelvis 2-3 Views Right IMPRESSION: 1. Large displaced spiral fracture involving the mid shaft of the right femur spanning approximately 8.1 cm. 2. Limited evaluation of the right hip joint alignment due to patient positioning. The right hip hardware appears intact. Electronically Signed   By: Emmaline Kluver M.D.   On: 06/13/2019 19:01    Microbiology: none  Antimicrobials: Anti-infectives (From admission, onward)   Start     Dose/Rate Route Frequency Ordered Stop   06/14/19 0900  vancomycin (VANCOCIN) IVPB 1000 mg/200 mL premix     1,000 mg 200 mL/hr over 60 Minutes Intravenous To Short Stay 06/14/19 0731 06/15/19 0900       Objective: Vitals:   06/13/19 2327 06/14/19 0104 06/14/19 0149 06/14/19 0340  BP: (!) 163/73 (!) 143/69 (!) 146/67 (!) 157/80  Pulse: 80 77 77 80  Resp: 20 20 18 18   Temp: (!) 97.4 F (36.3 C) (!) 97.5 F (36.4 C) 98.1 F (36.7 C) 98.1 F (36.7 C)  TempSrc: Oral Oral Oral Oral  SpO2: 93% 95% 93% 95%  Weight: 68 kg     Height: 5\' 2"  (1.575 m)      No intake or output data in the 24 hours ending 06/14/19 0744  Filed Weights   06/13/19 2327  Weight: 68 kg   Weight change:   Body mass index is 27.42 kg/m.  Intake/Output from previous day: No intake/output data recorded. Intake/Output this shift: No intake/output data recorded.  Examination:  General exam: Appears calm and comfortable, in pain- back pain.,  HEENT:PERRL,Oral mucosa moist, Ear/Nose normal on gross exam Respiratory system: Bilateral equal air entry, normal vesicular breath sounds, no wheezes or crackles  Cardiovascular system: S1 & S2 heard,No JVD, murmurs. Gastrointestinal system: Abdomen is  soft, non tender, non distended, BS +  Nervous System:Alert and oriented. No focal neurological deficits/moving extremities, sensation intact. Extremities: No edema, no clubbing, distal peripheral pulses palpable. Skin: No rashes, lesions, no icterus MSK: Normal muscle bulk,tone ,power  Medications:  Scheduled Meds: . chlorhexidine  60 mL Topical Once  . enoxaparin (LOVENOX) injection  40 mg Subcutaneous Daily  . feeding supplement  296 mL Oral Once  . furosemide  20 mg Oral Daily  . gabapentin  600 mg Oral TID AC & HS  . mometasone-formoterol  2 puff Inhalation BID  . morphine  30 mg Oral QID  . oxyCODONE  15 mg Oral QID  . povidone-iodine  2 application Topical Once   Continuous Infusions: . tranexamic acid    . vancomycin      Data Reviewed: I have personally reviewed following labs and imaging studies  CBC: Recent Labs  Lab 06/13/19 1906 06/13/19 2113  WBC 12.9* 11.2*  NEUTROABS 10.5*  --   HGB 13.9 14.1  HCT 39.8 40.0  MCV 89.0 88.3  PLT 208 205   Basic Metabolic Panel: Recent Labs  Lab 06/13/19 1906 06/13/19 2113  NA 133*  --   K 3.3*  --   CL 97*  --   CO2 25  --   GLUCOSE 151*  --   BUN 14  --   CREATININE 0.81 0.74  CALCIUM 8.7*  --    GFR: Estimated Creatinine Clearance: 58.3 mL/min (by C-G formula based on SCr of 0.74 mg/dL). Liver Function Tests: No results for input(s): AST, ALT,  ALKPHOS, BILITOT, PROT, ALBUMIN in the last 168 hours. No results for input(s): LIPASE, AMYLASE in the last 168 hours. No results for input(s): AMMONIA in the last 168 hours. Coagulation Profile: Recent Labs  Lab 06/13/19 1906  INR 1.0   Cardiac Enzymes: No results for input(s): CKTOTAL, CKMB, CKMBINDEX, TROPONINI in the last 168 hours. BNP (last 3 results) No results for input(s): PROBNP in the last 8760 hours. HbA1C: No results for input(s): HGBA1C in the last 72 hours. CBG: No results for input(s): GLUCAP in the last 168 hours. Lipid Profile: No results for input(s): CHOL, HDL, LDLCALC, TRIG, CHOLHDL, LDLDIRECT in the last 72 hours. Thyroid Function Tests: No results for input(s): TSH, T4TOTAL, FREET4, T3FREE, THYROIDAB in the last 72 hours. Anemia Panel: No results for input(s): VITAMINB12, FOLATE, FERRITIN, TIBC, IRON, RETICCTPCT in the last 72 hours. Sepsis Labs: No results for input(s): PROCALCITON, LATICACIDVEN in the last 168  hours.  Recent Results (from the past 240 hour(s))  SARS Coronavirus 2 [facility] order, Performed in [facility] hospital lab) Nasopharyngeal Nasopharyngeal Swab     Status: None   Collection Time: 06/13/19  7:29 PM   Specimen: Nasopharyngeal Swab  Result Value Ref Range Status   SARS Coronavirus 2 NEGATIVE NEGATIVE Final    Comment: (NOTE) If result is NEGATIVE SARS-CoV-2 target nucleic acids are NOT DETECTED. The SARS-CoV-2 RNA is generally detectable in upper and lower  respiratory specimens during the acute phase of infection. The lowest  concentration of SARS-CoV-2 viral copies this assay can detect is 250  copies / mL. A negative result does not preclude SARS-CoV-2 infection  and should not be used as the sole basis for treatment or other  patient management decisions.  A negative result may occur with  improper specimen collection / handling, submission of specimen other  than nasopharyngeal swab, presence of viral mutation(s) within the   areas targeted by this assay, and inadequate number of viral copies  (<250 copies / mL). A negative result must be combined with clinical  observations, patient history, and epidemiological information. If result is POSITIVE SARS-CoV-2 target nucleic acids are DETECTED. The SARS-CoV-2 RNA is generally detectable in upper and lower  respiratory specimens dur ing the acute phase of infection.  Positive  results are indicative of active infection with SARS-CoV-2.  Clinical  correlation with patient history and other diagnostic information is  necessary to determine patient infection status.  Positive results do  not rule out bacterial infection or co-infection with other viruses. If result is PRESUMPTIVE POSTIVE SARS-CoV-2 nucleic acids MAY BE PRESENT.   A presumptive positive result was obtained on the submitted specimen  and confirmed on repeat testing.  While 2019 novel coronavirus  (SARS-CoV-2) nucleic acids may be present in the submitted sample  additional confirmatory testing may be necessary for epidemiological  and / or clinical management purposes  to differentiate between  SARS-CoV-2 and other Sarbecovirus currently known to infect humans.  If clinically indicated additional testing with an alternate test  methodology [id number]) is advised. The SARS-CoV-2 RNA is generally  detectable in upper and lower respiratory sp ecimens during the acute  phase of infection. The expected result is Negative. Fact Sheet for Patients:  BoilerBrush.com.cy Fact Sheet for Healthcare Providers: https://pope.com/ This test is not yet approved or cleared by the [place] FDA and has been authorized for detection and/or diagnosis of SARS-CoV-2 by FDA under an Emergency Use Authorization (EUA).  This EUA will remain in effect (meaning this test can be used) for the duration of the COVID-19 declaration under Section 564(b)(1) of the Act, 21 U.S.C.  section 360bbb-3(b)(1), unless the authorization is terminated or revoked sooner. Performed at [facility], [address], [place], [place] [address]       Radiology Studies: Dg Knee 2 Views Right  Result Date: 06/13/2019 CLINICAL DATA:  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 Pt stated that she was moving bags of dirt in her yard today, when she tripped over a stone and fell on her right knee. Pt said she felt something move in her knee when she fell on it. Pt h.*comment was truncated* EXAM: RIGHT KNEE - 1-2 VIEW COMPARISON:  None. FINDINGS: Partially visualized spiral fracture of the right femoral shaft. There are serpiginous sclerotic lesions in the distal femur and proximal tibia which may represent bony infarct. Osteopenia. No significant knee joint effusion. No evidence of acute fracture or dislocation in  the right knee. IMPRESSION: 1.  Partially visualized spiral fracture of the right femoral shaft. 2. Serpiginous sclerotic lesions in the distal femur and proximal tibia may represent bony infarct. Electronically Signed   By: Emmaline Kluver M.D.   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 Pt stated that she was moving bags of dirt in her yard today, when she tripped over a stone and fell on her right knee. Pt said she felt something move in her knee when she fell on it. EXAM: DG HIP (WITH OR WITHOUT PELVIS) 2-3V RIGHT COMPARISON:  Hip radiographs 10/22/2015 FINDINGS: Limited evaluation of hip joint alignment given the patient's positioning. The arthroplasty hardware appears intact. There is a large displaced spiral fracture involving the mid shaft of the right femur spanning approximately 8.1 cm. IMPRESSION: 1. Large displaced spiral fracture involving the mid shaft of the right femur spanning approximately 8.1 cm. 2. Limited evaluation of the right hip joint alignment due to  patient positioning. The right hip hardware appears intact. Electronically Signed   By: Emmaline Kluver M.D.   On: 06/13/2019 19:01      LOS: 1 day   Time spent: More than 50% of that time was spent in counseling and/or coordination of care.  Lanae Boast, MD [facility]  06/14/2019, 7:44 AM

## 2019-06-15 ENCOUNTER — Encounter (HOSPITAL_COMMUNITY): Payer: Self-pay | Admitting: Orthopedic Surgery

## 2019-06-15 LAB — BASIC METABOLIC PANEL
Anion gap: 10 (ref 5–15)
BUN: 10 mg/dL (ref 8–23)
CO2: 26 mmol/L (ref 22–32)
Calcium: 8.5 mg/dL — ABNORMAL LOW (ref 8.9–10.3)
Chloride: 100 mmol/L (ref 98–111)
Creatinine, Ser: 0.74 mg/dL (ref 0.44–1.00)
GFR calc Af Amer: >60 mL/min (ref >60)
GFR calc non Af Amer: >60 mL/min (ref >60)
Glucose, Bld: 178 mg/dL — ABNORMAL HIGH (ref 70–99)
Potassium: 3.7 mmol/L (ref 3.5–5.1)
Sodium: 136 mmol/L (ref 135–145)

## 2019-06-15 LAB — CBC
HCT: 37.8 % (ref 36.0–46.0)
Hemoglobin: 12.9 g/dL (ref 12.0–15.0)
MCH: 30.4 pg (ref 26.0–34.0)
MCHC: 34.1 g/dL (ref 30.0–36.0)
MCV: 88.9 fL (ref 80.0–100.0)
Platelets: 206 10*3/uL (ref 150–400)
RBC: 4.25 MIL/uL (ref 3.87–5.11)
RDW: 14.4 % (ref 11.5–15.5)
WBC: 12.4 10*3/uL — ABNORMAL HIGH (ref 4.0–10.5)
nRBC: 0 % (ref 0.0–0.2)

## 2019-06-15 NOTE — TOC Initial Note (Signed)
Transition of Care (TOC) - Initial/Assessment Note    Patient Details  Name: [name] MRN: [id number] Date of Birth: 12/23/1945  Transition of Care (TOC) CM/SW Contact:     H , LCSWA Phone Number: 06/15/2019, 3:28 PM  Clinical Narrative:                 CSW met with pt at bedside. Introduced self, role, reason for visit. Pt from home with her spouse, very self confident. CSW confirmed address and PCP. She states PT has already been in to see her today and she "has everything she needs." Declines any DME (has DME from previous surgeries; she sleeps in recliner and has "everything I need on the first floor") but is interested in HHPT. She requested that CSW call her home phone to speak with her husband Bill to discuss previous HH provider.  Called [phone]; no answer, HIPAA compliant message left with no call back as of 3:30pm. Per notes pt was [facility] with [facility], will retry call to husband.   Expected Discharge Plan: Home w Home Health Services Barriers to Discharge: Continued Medical Work up   Patient Goals and CMS Choice Patient states their goals for this hospitalization and ongoing recovery are:: to get home CMS Medicare.gov Compare Post Acute Care list provided to:: Patient Choice offered to / list presented to : Patient  Expected Discharge Plan and Services Expected Discharge Plan: Home w Home Health Services In-house Referral: Clinical Social Work Discharge Planning Services: CM Consult Post Acute Care Choice: Home Health Living arrangements for the past 2 months: Single Family Home HH Arranged: PT HH Agency: [facility] ([facility])   Prior Living Arrangements/Services Living arrangements for the past 2 months: Single Family Home Lives with:: Spouse Patient language and need for interpreter reviewed:: Yes(no needs) Do you feel safe going back to the place where you live?: Yes      Need for Family Participation in Patient Care:  Yes (Comment)(assistance with ADL/IADLS) Care giver support system in place?: Yes (comment)(spouse; adult daughter) Current home services: DME Criminal Activity/Legal Involvement Pertinent to Current Situation/Hospitalization: No - Comment as needed  Activities of Daily Living      Permission Sought/Granted Permission sought to share information with : Facility Contact Representative Permission granted to share information with : Yes, Verbal Permission Granted  Share Information with NAME: William (Bill) Hufstetler  Permission granted to share info w AGENCY: [facility]- [facility]  Permission granted to share info w Relationship: spouse     Emotional Assessment Appearance:: Appears stated age Attitude/Demeanor/Rapport: Self-Confident, Engaged Affect (typically observed): Blunt Orientation: : Oriented to Situation, Oriented to  Time, Oriented to Place, Oriented to Self Alcohol / Substance Use: Not Applicable Psych Involvement: No (comment)  Admission diagnosis:  Closed fracture of right femur, unspecified fracture morphology, initial encounter (HCC) [S72.91XA] Patient Active Problem List   Diagnosis Date Noted  . Closed fracture of shaft of right femur (HCC) 06/13/2019  . Pressure injury of skin 09/22/2017  . Chronic pain 09/20/2017  . Abscess of upper lobe of left lung with pneumonia (HCC)   . Tobacco consumption 02/12/2016  . Hoarseness of voice 01/07/2016  . 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 Infected wound, initial encounter 06/20/2015  . Cholecystitis   . Esophageal fistula   . Abscess   . Discitis   . HCAP (healthcare-associated pneumonia)   . 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HCC) 04/28/2015  . Abscess of neck   .   Transition of Care [facility]) - Initial/Assessment Note    Patient Details  Name: [name] MRN: [id number] Date of Birth: 1945/10/15  Transition of Care [facility]) CM/SW Contact:    Alexander Mt, [place] Phone Number: 06/15/2019, 3:28 PM  Clinical Narrative:                 CSW met with pt at bedside. Introduced self, role, reason for visit. Pt from home with her spouse, very self confident. CSW confirmed address and PCP. She states PT has already been in to see her today and she "has everything she needs." Declines any DME (has DME from previous surgeries; she sleeps in recliner and has "everything I need on the first floor") but is interested in [place]. She requested that CSW call her home phone to speak with her husband Rush Landmark to discuss previous [facility] provider.  Called [phone]; no answer, HIPAA compliant message left with no call back as of 3:30pm. Per notes pt was [facility] active with [facility], will retry call to husband.   Expected Discharge Plan: [place] Barriers to Discharge: Continued Medical Work up   Patient Goals and CMS Choice Patient states their goals for this hospitalization and ongoing recovery are:: to get home CMS Medicare.gov Compare Post Acute Care list provided to:: Patient Choice offered to / list presented to : Patient  Expected Discharge Plan and Services Expected Discharge Plan: [place] In-house Referral: Clinical Social Work Discharge Planning Services: CM Consult Post Acute Care Choice: [place] arrangements for the past 2 months: [place]: PT [place]: [place] ([facility])   Prior Living Arrangements/Services Living arrangements for the past 2 months: Single Family Home Lives with:: Spouse Patient language and need for interpreter reviewed:: Yes(no needs) Do you feel safe going back to the place where you live?: Yes      Need for Family Participation in Patient Care:  Yes (Comment)(assistance with ADL/IADLS) Care giver support system in place?: Yes (comment)(spouse; adult daughter) Current home services: DME Criminal Activity/Legal Involvement Pertinent to Current Situation/Hospitalization: No - Comment as needed  Activities of Daily Living      Permission Sought/Granted Permission sought to share information with : Chartered certified accountant granted to share information with : Yes, Verbal Permission Granted  Share Information with NAME: Gwyndolyn Saxon Data processing manager) Ysidro Evert  Permission granted to share info w AGENCY: [facility]  Permission granted to share info w Relationship: spouse     Emotional Assessment Appearance:: Appears stated age Attitude/Demeanor/Rapport: Self-Confident, Engaged Affect (typically observed): Blunt Orientation: : Oriented to Situation, Oriented to  Time, Oriented to Place, Oriented to Self Alcohol / Substance Use: Not Applicable Psych Involvement: No (comment)  Admission diagnosis:  Closed fracture of right femur, unspecified fracture morphology, initial encounter [facility]) [S72.91XA] Patient Active Problem List   Diagnosis Date Noted  . Closed fracture of shaft of right femur ([place]) 06/13/2019  . Pressure injury of skin 09/22/2017  . Chronic pain 09/20/2017  . Abscess of upper lobe of left lung with pneumonia ([place])   . Tobacco consumption 02/12/2016  . Hoarseness of voice 01/07/2016  . 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 Infected wound, initial encounter 06/20/2015  . Cholecystitis   . Esophageal fistula   . Abscess   . Discitis   . HCAP (healthcare-associated pneumonia)   . 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[place]) 04/28/2015  . Abscess of neck   .

## 2019-06-15 NOTE — Progress Notes (Signed)
Patient ID: [name], female   DOB: 1945-09-29, 73 y.o.   MRN: [id number] Subjective: 1 Day Post-Op Procedure(s) (LRB): ORIF PERI PROSTHETIC FEMUR FRACTURE (Right)    Patient a bit confused this am.  Disoriented to place/events.  Some pain right thigh.  No reported events  Objective:   VITALS:   Vitals:   06/15/19 0043 06/15/19 0518  BP: (!) 159/79 (!) 168/86  Pulse: 79 81  Resp: 18 18  Temp: 98.9 F (37.2 C) 98.5 F (36.9 C)  SpO2: 93% 94%    Neurovascular intact Incision: dressing C/D/I  LABS Recent Labs    06/13/19 1906 06/13/19 2113 06/15/19 0103  HGB 13.9 14.1 12.9  HCT 39.8 40.0 37.8  WBC 12.9* 11.2* 12.4*  PLT 208 205 206    Recent Labs    06/13/19 1906 06/13/19 2113 06/15/19 0103  NA 133*  --  136  K 3.3*  --  3.7  BUN 14  --  10  CREATININE 0.81 0.74 0.74  GLUCOSE 151*  --  178*    Recent Labs    06/13/19 1906  INR 1.0     Assessment/Plan: 1 Day Post-Op Procedure(s) (LRB): ORIF PERI PROSTHETIC FEMUR FRACTURE (Right)   [facility] diet Up with therapy - PWB RLE  Post op/medication/other etiology of confusion - continue to monitor, unfortunately she depends on chronic pain meds which could easily contribute to her confusion  Disposition depending on PT assessment of safety and strength as well as support at home  Nicotine effects on bone healing reviewed

## 2019-06-15 NOTE — Progress Notes (Signed)
Pt earlier enquired about her finger rings asking about their whereabouts. Looked around the room and couldn't locate them. Returned to the room on hourly rounding a while later and found the pt wearing all her rings. She stated that she found them in one one of her bags

## 2019-06-15 NOTE — Progress Notes (Signed)
PROGRESS NOTE    [name]  ZDG:[id number] DOB: Oct 29, 1945 DOA: 06/13/2019 PCP: Ileana Ladd, MD   Brief Narrative: As per H&P: 73 y.o. female with medical history significant of breast cancer status post right mastectomy, COPD with current tobacco use, atrial fibrillation not on anticoagulation, hypertension, osteo-arthritis, chronic back pain due to degenerative disc disorder who presented status post a fall and right hip and knee pain.  Patient was working in the garden this afternoon moving large bags of dirt when she tripped on a stone and landed on her right knee onto the gardening stones.  Patient states that she heard a crack and also felt movement around her knee.  She laid there for 20 minutes until she was found by her neighbor who informed her husband and called EMS.  Denies any chest pain or shortness of breath.  In VZ:DGLOVFIE and normotensive on room air.  She was in visible discomfort secondary to pain.  CBC shows mild leukocytosis of 12.9. Right knee x-ray shows partially visualized spiral fracture of the right femoral shaft.  Serpiginous sclerotic lesion in the distal femur and proximal tibia may represent bone infarct.  Hip x-ray showed large displaced spiral fracture involving the mid shaft of the right femur spanning approximately 8.1 cm.  Right hip hardware appears intact. Patient was admitted. S/P ORIF periprosthetic femur fracture 9/29.  Subjective: Pain controlled Alert,awake. Mildly confused this am. Was waiting to work with physical therapy.  Assessment & Plan:  Closed fracture of shaft of right femur, spiral fracture of the midshaft of the right femur status post fall: Appreciate orthopedic input s/p ORIF periprosthetic femur fracture. Cont home pain regimen on multiple narcotics. Pt/ot to ambualte and plan for dispo if stab;e  Chronic opiate use/chronic pain  Due to back isssues she says.patient on oxycodone 15 mg 4 times daily, morphine MS Contin 30 mg 4  times daily, Neurontin 600 mg 4 times daily at home along with Xanax 0.5 as needed: Continue IV/p.o. opiates to manage her acute pain as well as chronic pain.  COPD not exacerbation: Continue nebulizer as well as Dulera.  Hypertension: BP fairly controlled, on Lasix. Active smoking- 1ppd- added nicotnine patch. Counseled. Mild leukocytosis likely reactive..  Body mass index is 27.42 kg/m.   DVT prophylaxis: asa 81 mg bid Code Status: FULL Family Communication: plan of care discussed with patient in detail. Will update family Disposition Plan: Cont pt/ot. Plan on [facility] PT on d/c. Anticipate d/c  Tomorrow. She does not feel ready today 24 hrs. I followed up on the patient this afternoon after physical therapy.  Consultants:  Orthopedics  Procedures: s/p ORIF periprosthetic femur fracture  Radiology: Dg Knee 2 Views Right  IMPRESSION: 1.  Partially visualized spiral fracture of the right femoral shaft. 2. Serpiginous sclerotic lesions in the distal femur and proximal tibia may represent bony infarct. Electronically Signed   By: Emmaline Kluver M.D.   On: 06/13/2019 19:03   Dg Hip Unilat W Or Wo Pelvis 2-3 Views Right IMPRESSION: 1. Large displaced spiral fracture involving the mid shaft of the right femur spanning approximately 8.1 cm. 2. Limited evaluation of the right hip joint alignment due to patient positioning. The right hip hardware appears intact. Electronically Signed   By: Emmaline Kluver M.D.   On: 06/13/2019 19:01    Microbiology: none  Antimicrobials: Anti-infectives (From admission, onward)   Start     Dose/Rate Route Frequency Ordered Stop   06/15/19 0400  vancomycin (VANCOCIN) IVPB 1000  mg/200 mL premix     1,000 mg 200 mL/hr over 60 Minutes Intravenous Every 12 hours 06/14/19 1956 06/15/19 0510   06/14/19 0900  vancomycin (VANCOCIN) IVPB 1000 mg/200 mL premix     1,000 mg 200 mL/hr over 60 Minutes Intravenous To Short Stay 06/14/19 0731 06/14/19 1707        Objective: Vitals:   06/14/19 2222 06/15/19 0043 06/15/19 0518 06/15/19 0809  BP:  (!) 159/79 (!) 168/86   Pulse:  79 81   Resp:  18 18   Temp:  98.9 F (37.2 C) 98.5 F (36.9 C)   TempSrc:  Oral Oral   SpO2: 95% 93% 94% 93%  Weight:      Height:        Intake/Output Summary (Last 24 hours) at 06/15/2019 0841 Last data filed at 06/15/2019 0700 Gross per 24 hour  Intake 1855.29 ml  Output 1825 ml  Net 30.29 ml   Filed Weights   06/13/19 2327  Weight: 68 kg   Weight change:   Body mass index is 27.42 kg/m.  Intake/Output from previous day: 09/29 0701 - 09/30 0700 In: 1855.3 [P.O.:180; I.V.:1075.2; IV Piggyback:600.1] Out: 1825 [Urine:1525; Blood:300] Intake/Output this shift: No intake/output data recorded.  Examination:  General exam: Appears calm and comfortable, in pain- back pain.,  HEENT:PERRL,Oral mucosa moist, Ear/Nose normal on gross exam Respiratory system: Bilateral equal air entry, normal vesicular breath sounds, no wheezes or crackles  Cardiovascular system: S1 & S2 heard,No JVD, murmurs. Gastrointestinal system: Abdomen is  soft, non tender, non distended, BS +  Nervous System:Alert and oriented. No focal neurological deficits/moving extremities, sensation intact. Extremities: No edema, no clubbing, distal peripheral pulses palpable.  Right thigh with dressing intact clean and dry Skin: No rashes, lesions, no icterus MSK: Normal muscle bulk,tone ,power  Medications:  Scheduled Meds: . aspirin EC  81 mg Oral BID  . docusate sodium  100 mg Oral BID  . ferrous sulfate  325 mg Oral BID WC  . furosemide  20 mg Oral Daily  . gabapentin  600 mg Oral TID AC & HS  . mometasone-formoterol  2 puff Inhalation BID  . morphine  30 mg Oral QID  . nicotine  14 mg Transdermal Daily  . oxyCODONE  15 mg Oral QID  . pantoprazole  40 mg Oral Daily   Continuous Infusions: . sodium chloride 75 mL/hr at 06/15/19 0618  . lactated ringers 10 mL/hr at 06/14/19 1606  .  methocarbamol (ROBAXIN) IV Stopped (06/15/19 9629)    Data Reviewed: I have personally reviewed following labs and imaging studies  CBC: Recent Labs  Lab 06/13/19 1906 06/13/19 2113 06/15/19 0103  WBC 12.9* 11.2* 12.4*  NEUTROABS 10.5*  --   --   HGB 13.9 14.1 12.9  HCT 39.8 40.0 37.8  MCV 89.0 88.3 88.9  PLT 208 205 206   Basic Metabolic Panel: Recent Labs  Lab 06/13/19 1906 06/13/19 2113 06/15/19 0103  NA 133*  --  136  K 3.3*  --  3.7  CL 97*  --  100  CO2 25  --  26  GLUCOSE 151*  --  178*  BUN 14  --  10  CREATININE 0.81 0.74 0.74  CALCIUM 8.7*  --  8.5*   GFR: Estimated Creatinine Clearance: 58.3 mL/min (by C-G formula based on SCr of 0.74 mg/dL). Liver Function Tests: No results for input(s): AST, ALT, ALKPHOS, BILITOT, PROT, ALBUMIN in the last 168 hours. No results for input(s): LIPASE,  AMYLASE in the last 168 hours. No results for input(s): AMMONIA in the last 168 hours. Coagulation Profile: Recent Labs  Lab 06/13/19 1906  INR 1.0   Cardiac Enzymes: No results for input(s): CKTOTAL, CKMB, CKMBINDEX, TROPONINI in the last 168 hours. BNP (last 3 results) No results for input(s): PROBNP in the last 8760 hours. HbA1C: No results for input(s): HGBA1C in the last 72 hours. CBG: No results for input(s): GLUCAP in the last 168 hours. Lipid Profile: No results for input(s): CHOL, HDL, LDLCALC, TRIG, CHOLHDL, LDLDIRECT in the last 72 hours. Thyroid Function Tests: No results for input(s): TSH, T4TOTAL, FREET4, T3FREE, THYROIDAB in the last 72 hours. Anemia Panel: No results for input(s): VITAMINB12, FOLATE, FERRITIN, TIBC, IRON, RETICCTPCT in the last 72 hours. Sepsis Labs: No results for input(s): PROCALCITON, LATICACIDVEN in the last 168 hours.  Recent Results (from the past 240 hour(s))  SARS Coronavirus 2 [facility] order, Performed in [facility] hospital lab) Nasopharyngeal Nasopharyngeal Swab     Status: None   Collection Time: 06/13/19  7:29 PM    Specimen: Nasopharyngeal Swab  Result Value Ref Range Status   SARS Coronavirus 2 NEGATIVE NEGATIVE Final    Comment: (NOTE) If result is NEGATIVE SARS-CoV-2 target nucleic acids are NOT DETECTED. The SARS-CoV-2 RNA is generally detectable in upper and lower  respiratory specimens during the acute phase of infection. The lowest  concentration of SARS-CoV-2 viral copies this assay can detect is 250  copies / mL. A negative result does not preclude SARS-CoV-2 infection  and should not be used as the sole basis for treatment or other  patient management decisions.  A negative result may occur with  improper specimen collection / handling, submission of specimen other  than nasopharyngeal swab, presence of viral mutation(s) within the  areas targeted by this assay, and inadequate number of viral copies  (<250 copies / mL). A negative result must be combined with clinical  observations, patient history, and epidemiological information. If result is POSITIVE SARS-CoV-2 target nucleic acids are DETECTED. The SARS-CoV-2 RNA is generally detectable in upper and lower  respiratory specimens dur ing the acute phase of infection.  Positive  results are indicative of active infection with SARS-CoV-2.  Clinical  correlation with patient history and other diagnostic information is  necessary to determine patient infection status.  Positive results do  not rule out bacterial infection or co-infection with other viruses. If result is PRESUMPTIVE POSTIVE SARS-CoV-2 nucleic acids MAY BE PRESENT.   A presumptive positive result was obtained on the submitted specimen  and confirmed on repeat testing.  While 2019 novel coronavirus  (SARS-CoV-2) nucleic acids may be present in the submitted sample  additional confirmatory testing may be necessary for epidemiological  and / or clinical management purposes  to differentiate between  SARS-CoV-2 and other Sarbecovirus currently known to infect humans.  If  clinically indicated additional testing with an alternate test  methodology [phone]) is advised. The SARS-CoV-2 RNA is generally  detectable in upper and lower respiratory sp ecimens during the acute  phase of infection. The expected result is Negative. Fact Sheet for Patients:  BoilerBrush.com.cy Fact Sheet for Healthcare Providers: https://pope.com/ This test is not yet approved or cleared by the [place] FDA and has been authorized for detection and/or diagnosis of SARS-CoV-2 by FDA under an Emergency Use Authorization (EUA).  This EUA will remain in effect (meaning this test can be used) for the duration of the COVID-19 declaration under Section 564(b)(1) of the Act,  21 U.S.C. section 360bbb-3(b)(1), unless the authorization is terminated or revoked sooner. Performed at [facility], [address], [place], [place] [address]   Surgical pcr screen     Status: None   Collection Time: 06/14/19  5:06 AM   Specimen: Nasal Mucosa; Nasal Swab  Result Value Ref Range Status   MRSA, PCR NEGATIVE NEGATIVE Final   Staphylococcus aureus NEGATIVE NEGATIVE Final    Comment: (NOTE) The Xpert SA Assay (FDA approved for NASAL specimens in patients 40 years of age and older), is one component of a comprehensive surveillance program. It is not intended to diagnose infection nor to guide or monitor treatment. Performed at [facility], 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 Pt stated that she was moving bags of dirt in her yard today, when she tripped over a stone and fell on her right knee. Pt said she felt something move in her knee when she fell on it. Pt h.*comment was truncated* EXAM: RIGHT KNEE - 1-2 VIEW COMPARISON:  None. FINDINGS: Partially visualized spiral fracture of the right femoral  shaft. There are serpiginous sclerotic lesions in the distal femur and proximal tibia which may represent bony infarct. Osteopenia. No significant knee joint effusion. No evidence of acute fracture or dislocation in the right knee. IMPRESSION: 1.  Partially visualized spiral fracture of the right femoral shaft. 2. Serpiginous sclerotic lesions in the distal femur and proximal tibia may represent bony infarct. Electronically Signed   By: Emmaline Kluver M.D.   On: 06/13/2019 19:03   Dg C-arm 1-60 Min  Result Date: 06/14/2019 CLINICAL DATA:  Internal fixation EXAM: DG C-ARM 1-60 MIN; RIGHT FEMUR 2 VIEWS CONTRAST:  None FLUOROSCOPY TIME:  Fluoroscopy Time:  1 minutes 9 seconds Radiation Exposure Index (if provided by the fluoroscopic device): Number of Acquired Spot Images: 4 COMPARISON:  None. FINDINGS: Internal plate fixation of the distal femur fixation with cerclage wires and cortical screws. IMPRESSION: No complication following distal femur internal fixation Electronically Signed   By: Genevive Bi M.D.   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 Pt stated that she was moving bags of dirt in her yard today, when she tripped over a stone and fell on her right knee. Pt said she felt something move in her knee when she fell on it. EXAM: DG HIP (WITH OR WITHOUT PELVIS) 2-3V RIGHT COMPARISON:  Hip radiographs 10/22/2015 FINDINGS: Limited evaluation of hip joint alignment given the patient's positioning. The arthroplasty hardware appears intact. There is a large displaced spiral fracture involving the mid shaft of the right femur spanning approximately 8.1 cm. IMPRESSION: 1. Large displaced spiral fracture involving the mid shaft of the right femur spanning approximately 8.1 cm. 2. Limited evaluation of the right hip joint alignment due to patient positioning. The right hip hardware appears intact.  Electronically Signed   By: Emmaline Kluver M.D.   On: 06/13/2019 19:01   Dg Femur, Min 2 Views Right  Result Date: 06/14/2019 CLINICAL DATA:  Internal fixation EXAM: DG C-ARM 1-60 MIN; RIGHT FEMUR 2 VIEWS CONTRAST:  None FLUOROSCOPY TIME:  Fluoroscopy Time:  1 minutes 9 seconds Radiation Exposure Index (if provided by the fluoroscopic device): Number of Acquired Spot Images: 4  COMPARISON:  None. FINDINGS: Internal plate fixation of the distal femur fixation with cerclage wires and cortical screws. IMPRESSION: No complication following distal femur internal fixation Electronically Signed   By: Genevive Bi M.D.   On: 06/14/2019 20:08      LOS: 2 days   Time spent: More than 50% of that time was spent in counseling and/or coordination of care.  Lanae Boast, MD [facility]  06/15/2019, 8:41 AM

## 2019-06-15 NOTE — Evaluation (Signed)
Physical Therapy Evaluation Patient Details Name: [name] MRN: [id number] DOB: [birth year] Today's Date: 06/15/2019   History of Present Illness  73 y.o. female with medical history significant of breast cancer status post right mastectomy, COPD with current tobacco use, atrial fibrillation not on anticoagulation, hypertension, osteo-arthritis, chronic back pain due to degenerative disc disorder who presented status post a fall with R hip fx s/p repair (PWB)  Clinical Impression  Pt demonstrates deficits in functional mobility, gait, balance, power, endurance, and safety awareness. Pt very impulsive during session, attempting mobility prior to PT instruction, and ambulating briskly to/from bathroom. Pt does appear to abide by [facility] restrictions despite speed of mobility. Pt will benefit from further acute PT services to reinforce WB precautions and safety awareness, increase tolerance for activity, and reduce falls risk. Recommendations: Home with home health PT, no DME needs.    Follow Up Recommendations Home health PT    Equipment Recommendations  None recommended by PT    Recommendations for Other Services       Precautions / Restrictions Precautions Precautions: Fall Restrictions Weight Bearing Restrictions: Yes RLE Weight Bearing: Partial weight bearing RLE Partial Weight Bearing Percentage or Pounds: 50%      Mobility  Bed Mobility Overal bed mobility: Needs Assistance Bed Mobility: Supine to Sit     Supine to sit: Min assist        Transfers Overall transfer level: Needs assistance Equipment used: Rolling walker (2 wheeled) Transfers: Sit to/from Stand Sit to Stand: Min guard            Ambulation/Gait Ambulation/Gait assistance: Land (Feet): 15 Feet Assistive device: Rolling walker (2 wheeled) Gait Pattern/deviations: Step-to pattern;Decreased stance time - right Gait velocity: reduced Gait velocity interpretation: <1.8 ft/sec,  indicate of risk for recurrent falls General Gait Details: Pt with short step to gait with reduced stance time on RLE  Stairs            Wheelchair Mobility    Modified Rankin (Stroke Patients Only)       Balance Overall balance assessment: Needs assistance Sitting-balance support: Single extremity supported;Feet supported Sitting balance-Leahy Scale: Fair     Standing balance support: Bilateral upper extremity supported Standing balance-Leahy Scale: Fair                               Pertinent Vitals/Pain Pain Assessment: Faces Faces Pain Scale: Hurts even more Pain Location: R hip Pain Descriptors / Indicators: Aching Pain Intervention(s): Limited activity within patient's tolerance    Home Living Family/patient expects to be discharged to:: Private residence Living Arrangements: Spouse/significant other;Children Available Help at Discharge: Family;Available 24 hours/day Type of Home: House Home Access: Level entry     Home Layout: Multi-level;Able to live on main level with bedroom/bathroom Home Equipment: Dan Humphreys - 2 wheels;Cane - single point      Prior Function Level of Independence: Independent         Comments: pt fell while gardening and spreading soil around yard     Hand Dominance   Dominant Hand: Right    Extremity/Trunk Assessment   Upper Extremity Assessment Upper Extremity Assessment: Overall WFL for tasks assessed    Lower Extremity Assessment Lower Extremity Assessment: RLE deficits/detail RLE Deficits / Details: grossly 4-/5 secondary to pain    Cervical / Trunk Assessment Cervical / Trunk Assessment: Other exceptions;Kyphotic(significant scoliosis)  Communication   Communication: No difficulties  Cognition Arousal/Alertness: Awake/alert  Behavior During Therapy: [facility] for tasks assessed/performed;Impulsive Overall Cognitive Status: Within Functional Limits for tasks assessed                                         General Comments      Exercises     Assessment/Plan    PT Assessment Patient needs continued PT services  PT Problem List Decreased strength;Decreased activity tolerance;Decreased balance;Decreased mobility;Decreased knowledge of use of DME;Decreased safety awareness;Decreased knowledge of precautions;Pain       PT Treatment Interventions DME instruction;Gait training;Functional mobility training;Therapeutic activities;Therapeutic exercise;Balance training;Neuromuscular re-education    PT Goals (Current goals can be found in the Care Plan section)  Acute Rehab PT Goals Patient Stated Goal: To go home PT Goal Formulation: With patient Time For Goal Achievement: 06/22/19 Potential to Achieve Goals: Good    Frequency Min 5X/week   Barriers to discharge        Co-evaluation               AM-PAC PT "6 Clicks" Mobility  Outcome Measure Help needed turning from your back to your side while in a flat bed without using bedrails?: A Little Help needed moving from lying on your back to sitting on the side of a flat bed without using bedrails?: A Little Help needed moving to and from a bed to a chair (including a wheelchair)?: A Little Help needed standing up from a chair using your arms (e.g., wheelchair or bedside chair)?: A Little Help needed to walk in hospital room?: A Little Help needed climbing 3-5 steps with a railing? : A Little 6 Click Score: 18    End of Session Equipment Utilized During Treatment: (none) Activity Tolerance: Patient tolerated treatment well Patient left: in bed;with call bell/phone within reach;with bed alarm set Nurse Communication: Mobility status PT Visit Diagnosis: Other abnormalities of gait and mobility (R26.89)    Time: 2440-1027 PT Time Calculation (min) (ACUTE ONLY): 26 min   Charges:   PT Evaluation $PT Eval Low Complexity: 1 Low PT Treatments $Gait Training: 8-22 mins        Arlyss Gandy, PT, DPT Acute  Rehabilitation Pager: [phone]   Arlyss Gandy 06/15/2019, 12:00 PM

## 2019-06-15 NOTE — Op Note (Signed)
NAME: [name], [place] MEDICAL RECORD ZD:[id number] ACCOUNT [id number] DATE OF BIRTH:Dec 15, 1945 FACILITY: [facility] LOCATION: [facility] PHYSICIAN:Zeph Riebel D. Charlann Boxer, MD  OPERATIVE REPORT  DATE OF PROCEDURE:  06/14/2019  PREOPERATIVE DIAGNOSIS:  Right distal midshaft femoral periprosthetic fracture with a stable femoral prosthesis above.  POSTOPERATIVE DIAGNOSIS:  Right distal midshaft femoral periprosthetic fracture with a stable femoral prosthesis above.  PROCEDURE:  Open reduction internal fixation of right femur fracture utilizing a 15-hole NCB plate with Zimmer plate with 3 cables around the spiral fracture segment with proximal distal screws placement.  SURGEON:  Durene Romans, MD  ASSISTANT:  Dennie Bible, PA-C.  Note that Ms. Adrian Blackwater was present for the entirety of the case from preoperative positioning, perioperative management of the operative extremity, general facilitation of case, and primary wound closure.  ANESTHESIA:  General.  SPECIMENS:  None.  COMPLICATIONS:  None.  ESTIMATED BLOOD LOSS:  300 mL.  DRAINS:  None.  INDICATIONS FOR PROCEDURE:  The patient is a 73 year old female patient of mine from prior total hip arthroplasties.  She was working in her garden yesterday when she stumbled and fell on her right side.  She had inability to bear weight with significant  pain.  The emergency room evaluation revealed a spiral fracture to the mid to distal third of the shaft to the right femur below her right femoral component with stable femoral and acetabular components on this right side.  She was admitted to the  hospitalist service and optimized for surgery today.  Risks, benefits, and necessity of the procedure were discussed and reviewed.  The risks of malunion, nonunion, infection, need for future surgeries were discussed.  Consent was obtained for benefit of  fracture management.  PROCEDURE IN DETAIL:  The patient was brought to the operative theater.  Once adequate  anesthesia, preoperative antibiotics, vancomycin administered due to a cephalosporin allergy, she was positioned supine.  The right perineum was pre-draped.  The right  lower extremity was then prepped and draped in sterile fashion from around the iliac crest to her lower leg.  A timeout was performed identifying the patient, the planned procedure, and extremity.  An incision was then made following identification of  landmarks under fluoroscopic imaging from the knee laterally to above the fracture site.  Soft tissue dissection was carried down to the iliotibial band, which was then split.  The vastus lateralis was then elevated off the posterior intermuscular  septum, cauterized perforating vessels.  The fracture site was identified.  Once I had it adequately exposed, we used a combination of traction and external rotation and were able to reduce the fracture into a visually anatomically reduced pattern.  I  placed a clamp on this initially and confirmed this radiographically.  Once this was confirmed, I placed two 20-gauge wires around the fracture site to provide provisional stabilization while the surgeon placed the plate.  I selected a 15-hole plate as  this would go proximal enough that it would bypass the stem of her femoral prosthesis.  We then appropriately oriented over the femur laterally, placed a single screw distally.  We then made certain it was appropriately positioned over the anterior and  posterior aspect of the femur laterally.  I then placed 2 screws proximal to the fracture site but distal to the tip of her prosthesis.  I then placed 3 other screws distally.  Once this was completed, I placed 3 cables around the fracture site, removing  the 20-gauge wires.  These cables were tensioned appropriately, crimped and  cut.  The wound was irrigated with normal saline solution at the end of the case.  Final radiographs confirmed a nearly anatomic reduction of her fracture site.  At this point,   the vastus lateralis was laid over the lateral aspect of the femur, and the iliotibial band was reapproximated using #1 Vicryl and V-Loc sutures.  The remainder of the wound was closed with 2-0 Vicryl in a running Monocryl stitch.  The lateral thigh was  cleaned, dried, and dressed sterilely using surgical glue and an Aquacel dressing.  She was brought to the recovery room in stable condition, tolerating the procedure well.  LN/NUANCE  D:06/14/2019 T:06/15/2019 JOB:[id number]

## 2019-06-16 DIAGNOSIS — S72344A Nondisplaced spiral fracture of shaft of right femur, initial encounter for closed fracture: Secondary | ICD-10-CM

## 2019-06-16 LAB — CBC
HCT: 30.5 % — ABNORMAL LOW (ref 36.0–46.0)
Hemoglobin: 10.7 g/dL — ABNORMAL LOW (ref 12.0–15.0)
MCH: 31.5 pg (ref 26.0–34.0)
MCHC: 35.1 g/dL (ref 30.0–36.0)
MCV: 89.7 fL (ref 80.0–100.0)
Platelets: 157 10*3/uL (ref 150–400)
RBC: 3.4 MIL/uL — ABNORMAL LOW (ref 3.87–5.11)
RDW: 14.3 % (ref 11.5–15.5)
WBC: 10.8 10*3/uL — ABNORMAL HIGH (ref 4.0–10.5)
nRBC: 0 % (ref 0.0–0.2)

## 2019-06-16 MED ORDER — NICOTINE 14 MG/24HR TD PT24: 14.0000 mg | MEDICATED_PATCH | Freq: Every day | TRANSDERMAL | 0 refills | Status: DC

## 2019-06-16 MED ORDER — FERROUS SULFATE 325 (65 FE) MG PO TABS: 325.0000 mg | ORAL_TABLET | Freq: Two times a day (BID) | ORAL | 3 refills | Status: DC

## 2019-06-16 MED ORDER — METHOCARBAMOL 500 MG PO TABS: 500.0000 mg | ORAL_TABLET | Freq: Four times a day (QID) | ORAL | 0 refills | Status: AC | PRN

## 2019-06-16 MED ORDER — ASPIRIN 81 MG PO TBEC: 81.0000 mg | DELAYED_RELEASE_TABLET | Freq: Two times a day (BID) | ORAL | 0 refills | Status: DC

## 2019-06-16 MED ORDER — PANTOPRAZOLE SODIUM 40 MG PO TBEC: 40.0000 mg | DELAYED_RELEASE_TABLET | Freq: Every day | ORAL | 0 refills | Status: DC

## 2019-06-16 NOTE — Progress Notes (Signed)
PT plans to see pt prior to DC this am. Plan is for [place] for HHPT.

## 2019-06-16 NOTE — Discharge Summary (Signed)
Physician Discharge Summary  [name] QMV:[id number] DOB: 21-Jun-1946 DOA: 06/13/2019  PCP: Ileana Ladd, MD  Admit date: 06/13/2019 Discharge date: 06/16/2019  Admitted From: Home  Discharge disposition:  Home with Home PT   Recommendations for Outpatient Follow-Up:   Follow up with your primary care provider in one week, follow-up with orthopedic surgery in 1 to 2 weeks.   Discharge Diagnosis:   Active Problems:   Closed fracture of shaft of right femur [facility])   Discharge Condition: Improved.  Diet recommendation: Low sodium, heart healthy.    Code status: Full.   History of Present Illness:   Patient is a 73 y.o.femalewith medical history significant ofbreast cancer status post right mastectomy, COPD with current tobacco use, atrial fibrillation not on anticoagulation, hypertension, osteo-arthritis, chronic back pain due to degenerative disc disorder presented to the hospital status post a fall and right hip and knee pain. Patient was working in the garden when she tripped on a stone and landed on her right knee onto the gardening stones. Patient states that she heard a crack and also felt movement around her knee. She laid there for 20 minutes until she was found by her neighbor who informed her husband and called EMS.   In ER: Patient was afebrile and normotensive on room air.She was in visible discomfort secondary to pain.CBC shows mild leukocytosis of 12.9. Right knee x-ray shows partially visualized spiral fracture of the right femoral shaft. Serpiginous sclerotic lesion in the distal femur and proximal tibia may represent bone infarct. Hip x-ray showed large displaced spiral fracture involving the mid shaft of the right femur spanning approximately 8.1 cm. Right hip hardware appears intact. Patient was admitted. S/P ORIF periprosthetic femur fracture 9/29.   Hospital Course:  Following conditions were addressed during hospitalization,  Closed  fracture of shaft of right femur, spiral fracture of the midshaft of the right femur status post fall:  s/p ORIF for periprosthetic femur fracture on 06/14/19.  Patient was seen by physical therapy and recommended home health PT on discharge.  Chronic opiate use/chronic pain  Due to back isssues. Patient on oxycodone 15 mg 4 times daily, morphine MS Contin 30 mg 4 times daily, Neurontin 600 mg 4 times daily at home along with Xanax 0.5 as needed.  This will be resumed on discharge.  Robaxin will be added to the regimen.  COPD not exacerbation: Continue home medications on discharge.  Essential hypertension: BP fairly controlled, resume home meds  Active smoking- 1ppd. Nicotine patch prescribed.   Mild leukocytosis likely reactive.  Disposition.  At this time, patient is stable for disposition home.  Patient will have to follow-up with her primary care physician and orthopedics as outpatient.    Medical Consultants:    Orthopedic surgery   Subjective:   Today, patient feels ok.  Denies overt pain, nausea, vomiting, shortness of breath or chest pain.  Discharge Exam:   Vitals:   06/16/19 0409 06/16/19 0859  BP: 134/88   Pulse: 82   Resp:    Temp: 98.2 F (36.8 C)   SpO2: 93% 94%   Vitals:   06/15/19 2013 06/15/19 2027 06/16/19 0409 06/16/19 0859  BP: (!) 111/58  134/88   Pulse: 89  82   Resp: 14     Temp: 98.8 F (37.1 C)  98.2 F (36.8 C)   TempSrc: Oral  Oral   SpO2: 91% 91% 93% 94%  Weight:      Height:  General exam: Appears calm and comfortable ,Not in distress HEENT:PERRL,Oral mucosa moist Respiratory system: Bilateral equal air entry, normal vesicular breath sounds, no wheezes or crackles  Cardiovascular system: S1 & S2 heard, RRR.  Gastrointestinal system: Abdomen is nondistended, soft and nontender. No organomegaly or masses felt. Normal bowel sounds heard. Central nervous system: Alert and oriented. No focal neurological deficits. Extremities:  No edema, no clubbing ,no cyanosis, distal peripheral pulses palpable.  Right thigh with dressing intact dry and clean. Skin: No rashes, lesions or ulcers,no icterus ,no pallor MSK: Normal muscle bulk,tone ,power   Procedures:    s/p ORIF for periprosthetic femur fracture 06/14/19  The results of significant diagnostics from this hospitalization (including imaging, microbiology, ancillary and laboratory) are listed below for reference.     Diagnostic Studies:   Dg Knee 2 Views Right  Result Date: 06/13/2019 CLINICAL DATA:  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 Pt stated that she was moving bags of dirt in her yard today, when she tripped over a stone and fell on her right knee. Pt said she felt something move in her knee when she fell on it. Pt h.*comment was truncated* EXAM: RIGHT KNEE - 1-2 VIEW COMPARISON:  None. FINDINGS: Partially visualized spiral fracture of the right femoral shaft. There are serpiginous sclerotic lesions in the distal femur and proximal tibia which may represent bony infarct. Osteopenia. No significant knee joint effusion. No evidence of acute fracture or dislocation in the right knee. IMPRESSION: 1.  Partially visualized spiral fracture of the right femoral shaft. 2. Serpiginous sclerotic lesions in the distal femur and proximal tibia may represent bony infarct. Electronically Signed   By: Emmaline Kluver M.D.   On: 06/13/2019 19:03   Dg C-arm 1-60 Min  Result Date: 06/14/2019 CLINICAL DATA:  Internal fixation EXAM: DG C-ARM 1-60 MIN; RIGHT FEMUR 2 VIEWS CONTRAST:  None FLUOROSCOPY TIME:  Fluoroscopy Time:  1 minutes 9 seconds Radiation Exposure Index (if provided by the fluoroscopic device): Number of Acquired Spot Images: 4 COMPARISON:  None. FINDINGS: Internal plate fixation of the distal femur fixation with cerclage wires and cortical screws. IMPRESSION: No complication following distal femur internal fixation Electronically Signed   By: Genevive Bi M.D.    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 Pt stated that she was moving bags of dirt in her yard today, when she tripped over a stone and fell on her right knee. Pt said she felt something move in her knee when she fell on it. EXAM: DG HIP (WITH OR WITHOUT PELVIS) 2-3V RIGHT COMPARISON:  Hip radiographs 10/22/2015 FINDINGS: Limited evaluation of hip joint alignment given the patient's positioning. The arthroplasty hardware appears intact. There is a large displaced spiral fracture involving the mid shaft of the right femur spanning approximately 8.1 cm. IMPRESSION: 1. Large displaced spiral fracture involving the mid shaft of the right femur spanning approximately 8.1 cm. 2. Limited evaluation of the right hip joint alignment due to patient positioning. The right hip hardware appears intact. Electronically Signed   By: Emmaline Kluver M.D.   On: 06/13/2019 19:01   Dg Femur, Min 2 Views Right  Result Date: 06/14/2019 CLINICAL DATA:  Internal fixation EXAM: DG C-ARM 1-60 MIN; RIGHT FEMUR 2 VIEWS CONTRAST:  None FLUOROSCOPY TIME:  Fluoroscopy Time:  1 minutes 9 seconds Radiation Exposure Index (if provided by the fluoroscopic device): Number of Acquired Spot  Images: 4 COMPARISON:  None. FINDINGS: Internal plate fixation of the distal femur fixation with cerclage wires and cortical screws. IMPRESSION: No complication following distal femur internal fixation Electronically Signed   By: Genevive Bi M.D.   On: 06/14/2019 20:08     Labs:   Basic Metabolic Panel: Recent Labs  Lab 06/13/19 1906 06/13/19 2113 06/15/19 0103  NA 133*  --  136  K 3.3*  --  3.7  CL 97*  --  100  CO2 25  --  26  GLUCOSE 151*  --  178*  BUN 14  --  10  CREATININE 0.81 0.74 0.74  CALCIUM 8.7*  --  8.5*   GFR Estimated Creatinine Clearance: 58.3 mL/min (by C-G formula based on SCr of 0.74 mg/dL). Liver  Function Tests: No results for input(s): AST, ALT, ALKPHOS, BILITOT, PROT, ALBUMIN in the last 168 hours. No results for input(s): LIPASE, AMYLASE in the last 168 hours. No results for input(s): AMMONIA in the last 168 hours. Coagulation profile Recent Labs  Lab 06/13/19 1906  INR 1.0    CBC: Recent Labs  Lab 06/13/19 1906 06/13/19 2113 06/15/19 0103 06/16/19 0202  WBC 12.9* 11.2* 12.4* 10.8*  NEUTROABS 10.5*  --   --   --   HGB 13.9 14.1 12.9 10.7*  HCT 39.8 40.0 37.8 30.5*  MCV 89.0 88.3 88.9 89.7  PLT 208 205 206 157   Cardiac Enzymes: No results for input(s): CKTOTAL, CKMB, CKMBINDEX, TROPONINI in the last 168 hours. BNP: Invalid input(s): POCBNP CBG: No results for input(s): GLUCAP in the last 168 hours. D-Dimer No results for input(s): DDIMER in the last 72 hours. Hgb A1c No results for input(s): HGBA1C in the last 72 hours. Lipid Profile No results for input(s): CHOL, HDL, LDLCALC, TRIG, CHOLHDL, LDLDIRECT in the last 72 hours. Thyroid function studies No results for input(s): TSH, T4TOTAL, T3FREE, THYROIDAB in the last 72 hours.  Invalid input(s): FREET3 Anemia work up No results for input(s): VITAMINB12, FOLATE, FERRITIN, TIBC, IRON, RETICCTPCT in the last 72 hours. Microbiology Recent Results (from the past 240 hour(s))  SARS Coronavirus 2 [facility] order, Performed in [facility] hospital lab) Nasopharyngeal Nasopharyngeal Swab     Status: None   Collection Time: 06/13/19  7:29 PM   Specimen: Nasopharyngeal Swab  Result Value Ref Range Status   SARS Coronavirus 2 NEGATIVE NEGATIVE Final    Comment: (NOTE) If result is NEGATIVE SARS-CoV-2 target nucleic acids are NOT DETECTED. The SARS-CoV-2 RNA is generally detectable in upper and lower  respiratory specimens during the acute phase of infection. The lowest  concentration of SARS-CoV-2 viral copies this assay can detect is 250  copies / mL. A negative result does not preclude SARS-CoV-2 infection   and should not be used as the sole basis for treatment or other  patient management decisions.  A negative result may occur with  improper specimen collection / handling, submission of specimen other  than nasopharyngeal swab, presence of viral mutation(s) within the  areas targeted by this assay, and inadequate number of viral copies  (<250 copies / mL). A negative result must be combined with clinical  observations, patient history, and epidemiological information. If result is POSITIVE SARS-CoV-2 target nucleic acids are DETECTED. The SARS-CoV-2 RNA is generally detectable in upper and lower  respiratory specimens dur ing the acute phase of infection.  Positive  results are indicative of active infection with SARS-CoV-2.  Clinical  correlation with patient history and other diagnostic information is  necessary to determine patient infection status.  Positive results do  not rule out bacterial infection or co-infection with other viruses. If result is PRESUMPTIVE POSTIVE SARS-CoV-2 nucleic acids MAY BE PRESENT.   A presumptive positive result was obtained on the submitted specimen  and confirmed on repeat testing.  While 2019 novel coronavirus  (SARS-CoV-2) nucleic acids may be present in the submitted sample  additional confirmatory testing may be necessary for epidemiological  and / or clinical management purposes  to differentiate between  SARS-CoV-2 and other Sarbecovirus currently known to infect humans.  If clinically indicated additional testing with an alternate test  methodology [phone]) is advised. The SARS-CoV-2 RNA is generally  detectable in upper and lower respiratory sp ecimens during the acute  phase of infection. The expected result is Negative. Fact Sheet for Patients:  BoilerBrush.com.cy Fact Sheet for Healthcare Providers: https://pope.com/ This test is not yet approved or cleared by the [place] FDA and has  been authorized for detection and/or diagnosis of SARS-CoV-2 by FDA under an Emergency Use Authorization (EUA).  This EUA will remain in effect (meaning this test can be used) for the duration of the COVID-19 declaration under Section 564(b)(1) of the Act, 21 U.S.C. section 360bbb-3(b)(1), unless the authorization is terminated or revoked sooner. Performed at [facility], [address], [place], [place] [address]   Surgical pcr screen     Status: None   Collection Time: 06/14/19  5:06 AM   Specimen: Nasal Mucosa; Nasal Swab  Result Value Ref Range Status   MRSA, PCR NEGATIVE NEGATIVE Final   Staphylococcus aureus NEGATIVE NEGATIVE Final    Comment: (NOTE) The Xpert SA Assay (FDA approved for NASAL specimens in patients 20 years of age and older), is one component of a comprehensive surveillance program. It is not intended to diagnose infection nor to guide or monitor treatment. Performed at [facility], [address] [place], [place] [address]      Discharge Instructions:   Discharge Instructions    Call MD for:  persistant nausea and vomiting   Complete by: As directed    Call MD for:  redness, tenderness, or signs of infection (pain, swelling, redness, odor or green/yellow discharge around incision site)   Complete by: As directed    Call MD for:  severe uncontrolled pain   Complete by: As directed    Diet - low sodium heart healthy   Complete by: As directed    Discharge instructions   Complete by: As directed    Follow up with your primary care physician within 1 week.  Follow-up with orthopedics as scheduled in 1 to 2 weeks   Increase activity slowly   Complete by: As directed      Allergies as of 06/16/2019      Reactions   Cephalexin Other (See Comments)   Took off first layer of skin inside of mouth   Chlorhexidine    Zithromax [azithromycin Dihydrate] Other (See Comments)   ORAL ULCERS      Medication List    TAKE these medications    albuterol 108 (90 Base) MCG/ACT inhaler Commonly known as: VENTOLIN HFA Inhale 2 puffs into the lungs every 6 (six) hours as needed for wheezing.   ALPRAZolam 0.25 MG tablet Commonly known as: XANAX Take one tablet daily or as needed   aspirin 81 MG EC tablet Take 1 tablet (81 mg total) by mouth 2 (two) times daily.   budesonide-formoterol 160-4.5 MCG/ACT inhaler Commonly  known as: SYMBICORT Inhale 2 puffs into the lungs 2 (two) times daily as needed (Wheezing). Reported on 10/16/2015   diclofenac 75 MG EC tablet Commonly known as: VOLTAREN Take 75 mg by mouth daily as needed for pain.   diclofenac sodium 1 % Gel Commonly known as: VOLTAREN Apply 1 application topically 4 (four) times daily as needed for pain.   ferrous sulfate 325 (65 FE) MG tablet Take 1 tablet (325 mg total) by mouth 2 (two) times daily with a meal.   furosemide 20 MG tablet Commonly known as: LASIX Take 1 tablet (20 mg total) by mouth daily.   gabapentin 600 MG tablet Commonly known as: NEURONTIN Take 600 mg by mouth 4 (four) times daily.   methocarbamol 500 MG tablet Commonly known as: ROBAXIN Take 1 tablet (500 mg total) by mouth every 6 (six) hours as needed for up to 5 days for muscle spasms. What changed: when to take this   morphine 30 MG 12 hr tablet Commonly known as: MS CONTIN Take 30 mg by mouth 4 (four) times daily.   nicotine 14 mg/24hr patch Commonly known as: NICODERM CQ - dosed in mg/24 hours Place 1 patch (14 mg total) onto the skin daily.   nitroGLYCERIN 0.4 MG SL tablet Commonly known as: NITROSTAT Place 1 tablet (0.4 mg total) under the tongue every 5 (five) minutes as needed for chest pain.   oxyCODONE 15 MG immediate release tablet Commonly known as: ROXICODONE Take 15 mg by mouth 4 (four) times daily.   pantoprazole 40 MG tablet Commonly known as: PROTONIX Take 1 tablet (40 mg total) by mouth daily.      Follow-up Information    Ileana Ladd, MD. Schedule an  appointment as soon as possible for a visit in 1 week(s).   Specialty: Family Medicine Why: regular follow up Contact information: [address] [place] [address] [phone]        Durene Romans, MD. Schedule an appointment as soon as possible for a visit.   Specialty: Orthopedic Surgery Why: follow up within one week, post surgical followup Contact information: [address] [place] [address] [phone]           Time coordinating discharge: 39 minutes  Signed:  Idalia Allbritton  [facility] 06/16/2019, 9:46 AM

## 2019-06-16 NOTE — TOC Transition Note (Signed)
Transition of Care [facility]) - CM/SW Discharge Note   Patient Details  Name: [name] MRN: [id number] Date of Birth: 08/08/1946  Transition of Care [facility]) CM/SW Contact:  Alexander [place], [place] Phone Number: 06/16/2019, 10:18 AM   Clinical Narrative:    CSW reached pt husband [name], confirmed HHPT and that they prefer [facility]. Pt husband requests a 3-N-1; call placed to Bethanne Ginger for this to be deliverd to their home address [address] [place], [place], [address].    Barriers to Discharge: Continued Medical Work up   Patient Goals and CMS Choice Patient states their goals for this hospitalization and ongoing recovery are:: to get home CMS Medicare.gov Compare Post Acute Care list provided to:: Patient Choice offered to / list presented to : Patient  Discharge Placement  Discharge Plan and Services In-house Referral: Clinical Social Work Discharge Planning Services: CM Consult Post Acute Care Choice: Home Health          DME Arranged: 3-N-1 DME Agency: [employer] Date DME Agency Contacted: 06/16/19   Representative spoke with at DME Agency: [place]: PT [place]: [place] ([place]) Date [place]: 06/16/19 Time [place]: 62 Representative spoke with at [place]: [place] ([place]) Interventions     Readmission Risk Interventions No flowsheet data found.

## 2019-06-16 NOTE — Progress Notes (Addendum)
Physical Therapy Treatment Patient Details Name: [name] MRN: [id number] DOB: 09-12-1946 Today's Date: 06/16/2019    History of Present Illness 73 y.o. female with medical history significant of breast cancer status post right mastectomy, COPD with current tobacco use, atrial fibrillation not on anticoagulation, hypertension, osteo-arthritis, chronic back pain due to degenerative disc disorder who presented status post a fall with R hip fx s/p repair (PWB)    PT Comments    Pt remains limited by pain during session, and is self-limiting as well. Pt increases tolerance for ambulation and demonstrates improved compliance with WB precautions. Pt also requires decreased assistance for transfers. Pt continues to need support for RLE management during bed mobility but feels her family can provide this support. Pt will benefit from continued acute PT services to reduce falls risk, and reduce assistance requirements during bed mobility. Pt requesting [facility] as she has worked with them in the past.    Follow Up Recommendations  Home health PT     Equipment Recommendations  None recommended by PT    Recommendations for Other Services       Precautions / Restrictions Precautions Precautions: Fall Restrictions Weight Bearing Restrictions: Yes RLE Weight Bearing: Partial weight bearing RLE Partial Weight Bearing Percentage or Pounds: 50    Mobility  Bed Mobility Overal bed mobility: Needs Assistance Bed Mobility: Supine to Sit;Sit to Supine     Supine to sit: Min assist Sit to supine: Min assist   General bed mobility comments: Pt requiring support for RUE management  Transfers Overall transfer level: Needs assistance Equipment used: Rolling walker (2 wheeled) Transfers: Sit to/from Stand Sit to Stand: Supervision            Ambulation/Gait Ambulation/Gait assistance: Supervision Gait Distance (Feet): 40 Feet Assistive device: Rolling walker (2 wheeled) Gait  Pattern/deviations: Step-to pattern;Decreased stance time - right Gait velocity: reduced Gait velocity interpretation: <1.8 ft/sec, indicate of risk for recurrent falls General Gait Details: Pt with short step to gait with reduced stance time on RLE. Pt with significant trunk flexion 2/2 scoliosis   Stairs             Wheelchair Mobility    Modified Rankin (Stroke Patients Only)       Balance Overall balance assessment: Needs assistance Sitting-balance support: Feet supported;No upper extremity supported Sitting balance-Leahy Scale: Fair     Standing balance support: Single extremity supported Standing balance-Leahy Scale: Fair                              Cognition Arousal/Alertness: Awake/alert Behavior During Therapy: WFL for tasks assessed/performed;Impulsive Overall Cognitive Status: Within Functional Limits for tasks assessed                                        Exercises      General Comments        Pertinent Vitals/Pain Pain Assessment: Faces Faces Pain Scale: Hurts even more Pain Location: R hip Pain Descriptors / Indicators: Aching Pain Intervention(s): Limited activity within patient's tolerance    Home Living                      Prior Function            PT Goals (current goals can now be found in the care plan section) Acute  Rehab PT Goals Patient Stated Goal: To go home Progress towards PT goals: Progressing toward goals    Frequency    Min 5X/week      PT Plan Current plan remains appropriate    Co-evaluation              AM-PAC PT "6 Clicks" Mobility   Outcome Measure  Help needed turning from your back to your side while in a flat bed without using bedrails?: A Little Help needed moving from lying on your back to sitting on the side of a flat bed without using bedrails?: A Little Help needed moving to and from a bed to a chair (including a wheelchair)?: None Help needed  standing up from a chair using your arms (e.g., wheelchair or bedside chair)?: None Help needed to walk in hospital room?: None Help needed climbing 3-5 steps with a railing? : A Little 6 Click Score: 21    End of Session Equipment Utilized During Treatment: Gait belt Activity Tolerance: Patient tolerated treatment well Patient left: in bed;with call bell/phone within reach;with bed alarm set Nurse Communication: Mobility status PT Visit Diagnosis: Other abnormalities of gait and mobility (R26.89)     Time: 1610-9604 PT Time Calculation (min) (ACUTE ONLY): 8 min  Charges:  $Gait Training: 8-22 mins                     Arlyss Gandy, PT, DPT Acute Rehabilitation Pager: [phone]    Arlyss Gandy 06/16/2019, 11:13 AM

## 2019-06-17 NOTE — Anesthesia Postprocedure Evaluation (Signed)
Anesthesia Post Note  Patient: [name]  Procedure(s) Performed: ORIF PERI PROSTHETIC FEMUR FRACTURE (Right )     Patient location during evaluation: PACU Anesthesia Type: General Level of consciousness: awake Pain management: pain level controlled Respiratory status: spontaneous breathing Postop Assessment: no apparent nausea or vomiting Anesthetic complications: no    Last Vitals:  Vitals:   06/16/19 0409 06/16/19 0859  BP: 134/88   Pulse: 82   Resp:    Temp: 36.8 C   SpO2: 93% 94%    Last Pain:  Vitals:   06/16/19 0449  TempSrc:   PainSc: Asleep                 Laqueisha Catalina

## 2019-06-18 DIAGNOSIS — Z9181 History of falling: Secondary | ICD-10-CM | POA: Diagnosis not present

## 2019-06-18 DIAGNOSIS — E785 Hyperlipidemia, unspecified: Secondary | ICD-10-CM | POA: Diagnosis not present

## 2019-06-18 DIAGNOSIS — J439 Emphysema, unspecified: Secondary | ICD-10-CM | POA: Diagnosis not present

## 2019-06-18 DIAGNOSIS — M549 Dorsalgia, unspecified: Secondary | ICD-10-CM | POA: Diagnosis not present

## 2019-06-18 DIAGNOSIS — I2510 Atherosclerotic heart disease of native coronary artery without angina pectoris: Secondary | ICD-10-CM | POA: Diagnosis not present

## 2019-06-18 DIAGNOSIS — I10 Essential (primary) hypertension: Secondary | ICD-10-CM | POA: Diagnosis not present

## 2019-06-18 DIAGNOSIS — G8929 Other chronic pain: Secondary | ICD-10-CM | POA: Diagnosis not present

## 2019-06-18 DIAGNOSIS — Z853 Personal history of malignant neoplasm of breast: Secondary | ICD-10-CM | POA: Diagnosis not present

## 2019-06-18 DIAGNOSIS — Z9011 Acquired absence of right breast and nipple: Secondary | ICD-10-CM | POA: Diagnosis not present

## 2019-06-18 DIAGNOSIS — Z7982 Long term (current) use of aspirin: Secondary | ICD-10-CM | POA: Diagnosis not present

## 2019-06-18 DIAGNOSIS — W01198D Fall on same level from slipping, tripping and stumbling with subsequent striking against other object, subsequent encounter: Secondary | ICD-10-CM | POA: Diagnosis not present

## 2019-06-18 DIAGNOSIS — I252 Old myocardial infarction: Secondary | ICD-10-CM | POA: Diagnosis not present

## 2019-06-18 DIAGNOSIS — I4891 Unspecified atrial fibrillation: Secondary | ICD-10-CM | POA: Diagnosis not present

## 2019-06-18 DIAGNOSIS — M1990 Unspecified osteoarthritis, unspecified site: Secondary | ICD-10-CM | POA: Diagnosis not present

## 2019-06-18 DIAGNOSIS — S72341D Displaced spiral fracture of shaft of right femur, subsequent encounter for closed fracture with routine healing: Secondary | ICD-10-CM | POA: Diagnosis not present

## 2019-06-18 DIAGNOSIS — W19XXXD Unspecified fall, subsequent encounter: Secondary | ICD-10-CM | POA: Diagnosis not present

## 2019-06-18 DIAGNOSIS — K225 Diverticulum of esophagus, acquired: Secondary | ICD-10-CM | POA: Diagnosis not present

## 2019-06-18 DIAGNOSIS — Z955 Presence of coronary angioplasty implant and graft: Secondary | ICD-10-CM | POA: Diagnosis not present

## 2019-06-18 DIAGNOSIS — M9701XD Periprosthetic fracture around internal prosthetic right hip joint, subsequent encounter: Secondary | ICD-10-CM | POA: Diagnosis not present

## 2019-06-18 DIAGNOSIS — F17210 Nicotine dependence, cigarettes, uncomplicated: Secondary | ICD-10-CM | POA: Diagnosis not present

## 2019-06-18 DIAGNOSIS — Z79891 Long term (current) use of opiate analgesic: Secondary | ICD-10-CM | POA: Diagnosis not present

## 2019-06-30 DIAGNOSIS — S7221XD Displaced subtrochanteric fracture of right femur, subsequent encounter for closed fracture with routine healing: Secondary | ICD-10-CM | POA: Diagnosis not present

## 2019-06-30 DIAGNOSIS — Z4789 Encounter for other orthopedic aftercare: Secondary | ICD-10-CM | POA: Diagnosis not present

## 2019-07-07 DIAGNOSIS — Z9010 Acquired absence of unspecified breast and nipple: Secondary | ICD-10-CM | POA: Diagnosis not present

## 2019-07-07 DIAGNOSIS — E1151 Type 2 diabetes mellitus with diabetic peripheral angiopathy without gangrene: Secondary | ICD-10-CM | POA: Diagnosis not present

## 2019-07-07 DIAGNOSIS — Z09 Encounter for follow-up examination after completed treatment for conditions other than malignant neoplasm: Secondary | ICD-10-CM | POA: Diagnosis not present

## 2019-07-07 DIAGNOSIS — I10 Essential (primary) hypertension: Secondary | ICD-10-CM | POA: Diagnosis not present

## 2019-07-07 DIAGNOSIS — S728X1A Other fracture of right femur, initial encounter for closed fracture: Secondary | ICD-10-CM | POA: Diagnosis not present

## 2019-07-07 DIAGNOSIS — F17200 Nicotine dependence, unspecified, uncomplicated: Secondary | ICD-10-CM | POA: Diagnosis not present

## 2019-07-18 DIAGNOSIS — G8929 Other chronic pain: Secondary | ICD-10-CM | POA: Diagnosis not present

## 2019-07-18 DIAGNOSIS — S72341D Displaced spiral fracture of shaft of right femur, subsequent encounter for closed fracture with routine healing: Secondary | ICD-10-CM | POA: Diagnosis not present

## 2019-07-18 DIAGNOSIS — I2510 Atherosclerotic heart disease of native coronary artery without angina pectoris: Secondary | ICD-10-CM | POA: Diagnosis not present

## 2019-07-18 DIAGNOSIS — Z7982 Long term (current) use of aspirin: Secondary | ICD-10-CM | POA: Diagnosis not present

## 2019-07-18 DIAGNOSIS — Z955 Presence of coronary angioplasty implant and graft: Secondary | ICD-10-CM | POA: Diagnosis not present

## 2019-07-18 DIAGNOSIS — I252 Old myocardial infarction: Secondary | ICD-10-CM | POA: Diagnosis not present

## 2019-07-18 DIAGNOSIS — E785 Hyperlipidemia, unspecified: Secondary | ICD-10-CM | POA: Diagnosis not present

## 2019-07-18 DIAGNOSIS — I10 Essential (primary) hypertension: Secondary | ICD-10-CM | POA: Diagnosis not present

## 2019-07-18 DIAGNOSIS — M9701XD Periprosthetic fracture around internal prosthetic right hip joint, subsequent encounter: Secondary | ICD-10-CM | POA: Diagnosis not present

## 2019-07-18 DIAGNOSIS — W01198D Fall on same level from slipping, tripping and stumbling with subsequent striking against other object, subsequent encounter: Secondary | ICD-10-CM | POA: Diagnosis not present

## 2019-07-18 DIAGNOSIS — Z9181 History of falling: Secondary | ICD-10-CM | POA: Diagnosis not present

## 2019-07-18 DIAGNOSIS — J439 Emphysema, unspecified: Secondary | ICD-10-CM | POA: Diagnosis not present

## 2019-07-18 DIAGNOSIS — W19XXXD Unspecified fall, subsequent encounter: Secondary | ICD-10-CM | POA: Diagnosis not present

## 2019-07-18 DIAGNOSIS — M1990 Unspecified osteoarthritis, unspecified site: Secondary | ICD-10-CM | POA: Diagnosis not present

## 2019-07-18 DIAGNOSIS — Z79891 Long term (current) use of opiate analgesic: Secondary | ICD-10-CM | POA: Diagnosis not present

## 2019-07-18 DIAGNOSIS — K225 Diverticulum of esophagus, acquired: Secondary | ICD-10-CM | POA: Diagnosis not present

## 2019-07-18 DIAGNOSIS — F17210 Nicotine dependence, cigarettes, uncomplicated: Secondary | ICD-10-CM | POA: Diagnosis not present

## 2019-07-18 DIAGNOSIS — Z853 Personal history of malignant neoplasm of breast: Secondary | ICD-10-CM | POA: Diagnosis not present

## 2019-07-18 DIAGNOSIS — Z9011 Acquired absence of right breast and nipple: Secondary | ICD-10-CM | POA: Diagnosis not present

## 2019-07-18 DIAGNOSIS — M549 Dorsalgia, unspecified: Secondary | ICD-10-CM | POA: Diagnosis not present

## 2019-07-18 DIAGNOSIS — I4891 Unspecified atrial fibrillation: Secondary | ICD-10-CM | POA: Diagnosis not present

## 2019-07-20 DIAGNOSIS — W01198D Fall on same level from slipping, tripping and stumbling with subsequent striking against other object, subsequent encounter: Secondary | ICD-10-CM | POA: Diagnosis not present

## 2019-07-20 DIAGNOSIS — W19XXXD Unspecified fall, subsequent encounter: Secondary | ICD-10-CM | POA: Diagnosis not present

## 2019-07-20 DIAGNOSIS — M9701XD Periprosthetic fracture around internal prosthetic right hip joint, subsequent encounter: Secondary | ICD-10-CM | POA: Diagnosis not present

## 2019-07-20 DIAGNOSIS — I252 Old myocardial infarction: Secondary | ICD-10-CM | POA: Diagnosis not present

## 2019-07-20 DIAGNOSIS — S72341D Displaced spiral fracture of shaft of right femur, subsequent encounter for closed fracture with routine healing: Secondary | ICD-10-CM | POA: Diagnosis not present

## 2019-07-20 DIAGNOSIS — I2510 Atherosclerotic heart disease of native coronary artery without angina pectoris: Secondary | ICD-10-CM | POA: Diagnosis not present

## 2019-07-22 IMAGING — CT CT ABD-PELV W/ CM
2 of 8 series · 12 of 36 positions shown, 15 images · IV contrast (ISOVUE)
Comparison: Chest x-ray 09/20/2017, esophagram 11/11/2016

CLINICAL DATA: Nausea and vomiting. Pt is presented by her spouse,
both collaborate a report that since her enteral feeding tube was
taken out about a week ago, she has not "been doing well, has not
had any meaningful PO intake and is progressively worsening.

EXAM:
CT CHEST, ABDOMEN, AND PELVIS WITH CONTRAST
TECHNIQUE: Multidetector CT imaging of the chest, abdomen and pelvis was
performed following the standard protocol during bolus
administration of intravenous contrast.
CONTRAST:  100mL 5130GQ-677 IOPAMIDOL (5130GQ-677) INJECTION 61%

[Series 3: thins · axial · 0.95mm/px · z∈[-597,-71]mm · 9 of 845 slices shown, 12 images]
[im 47/845  mediastinal]
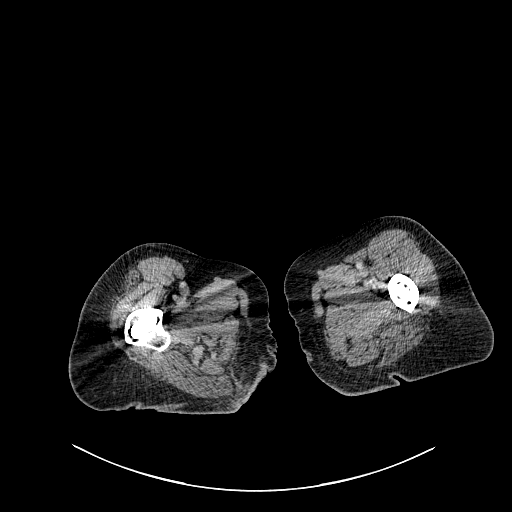
[im 47/845  lung]
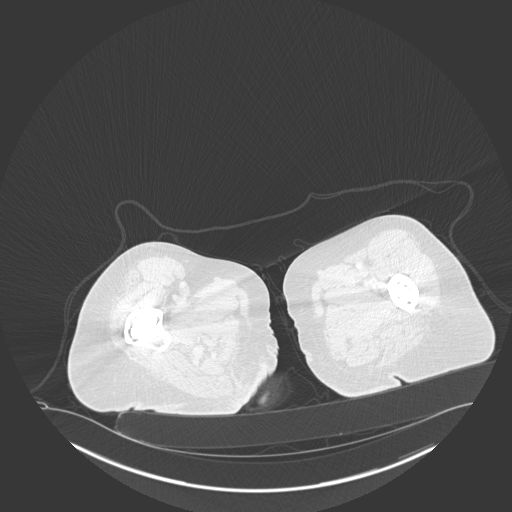
[im 141/845  lung]
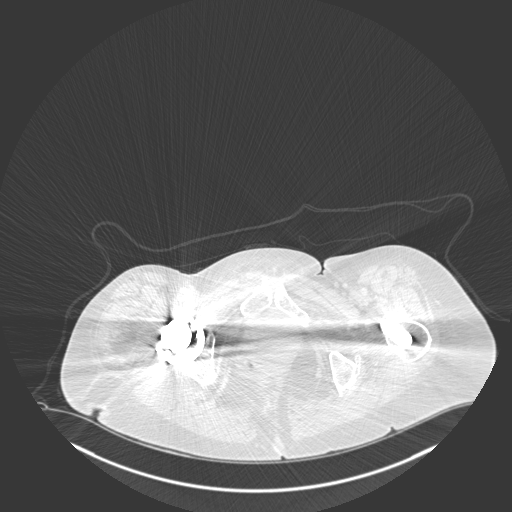
[im 235/845  lung]
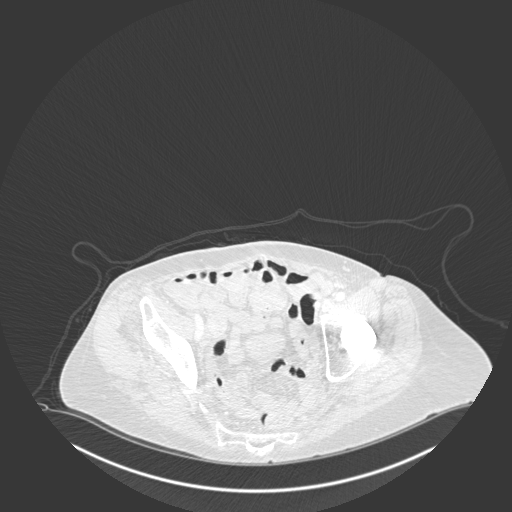
[im 329/845  lung]
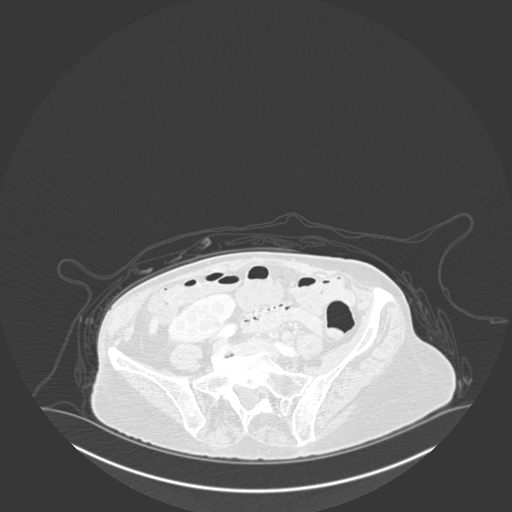
[im 423/845  mediastinal]
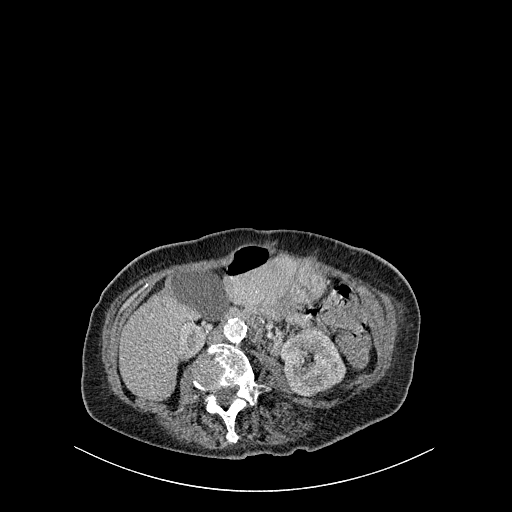
[im 423/845  lung]
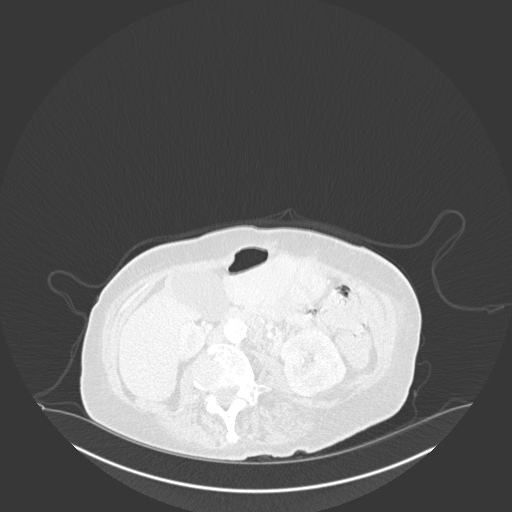
[im 516/845  lung]
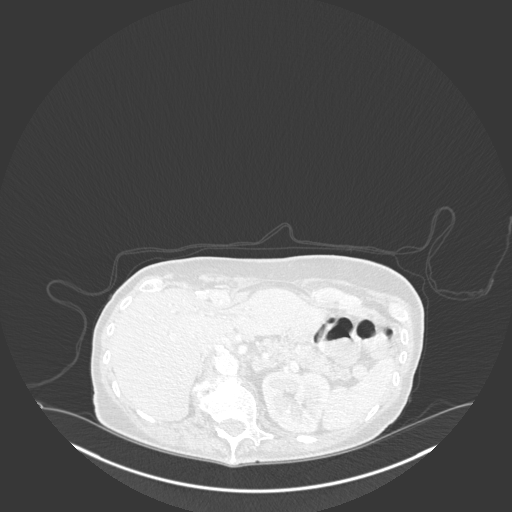
[im 610/845  lung]
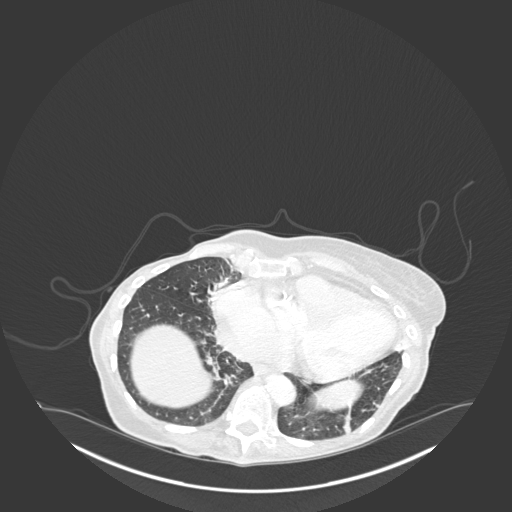
[im 704/845  lung]
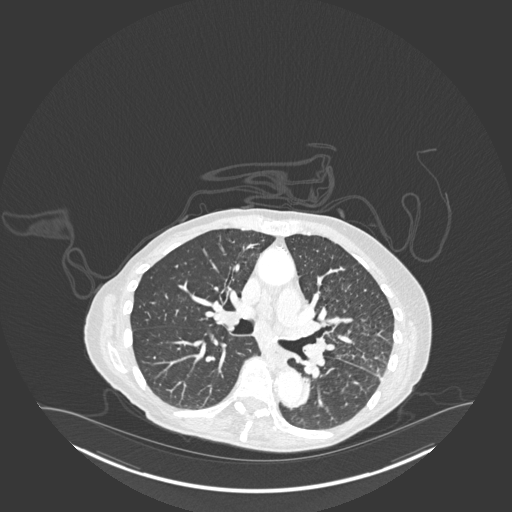
[im 798/845  mediastinal]
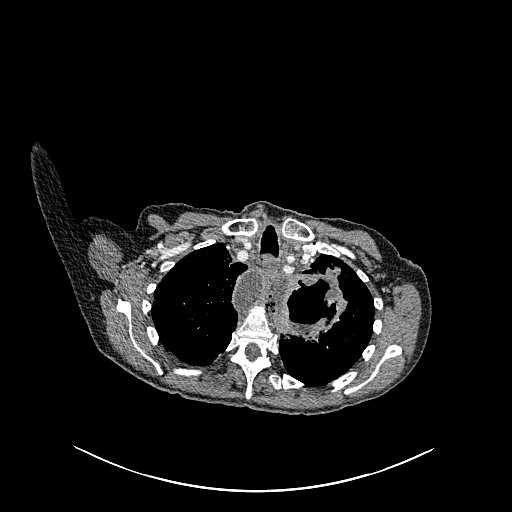
[im 798/845  lung]
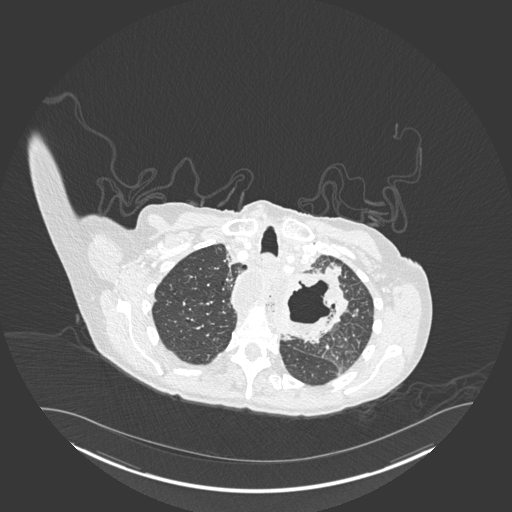

[Series 7: coronals · coronal · 0.73mm/px · 3 of 136 slices shown]
[im 28/136  lung]
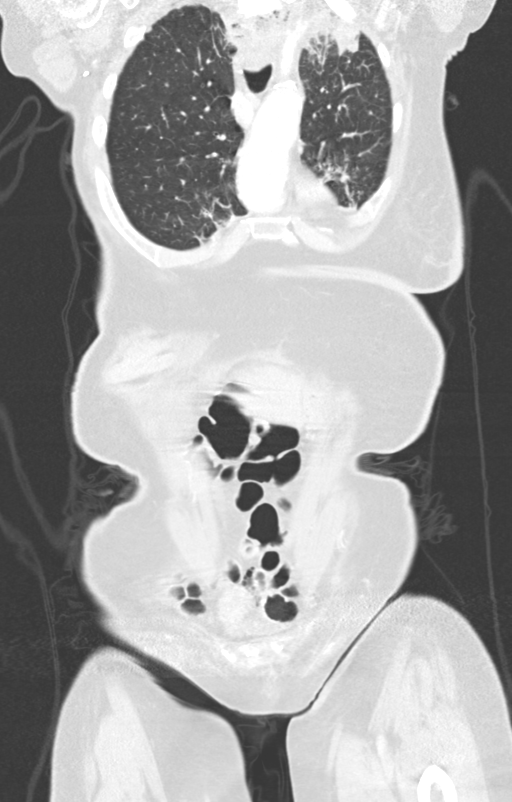
[im 55/136  lung]
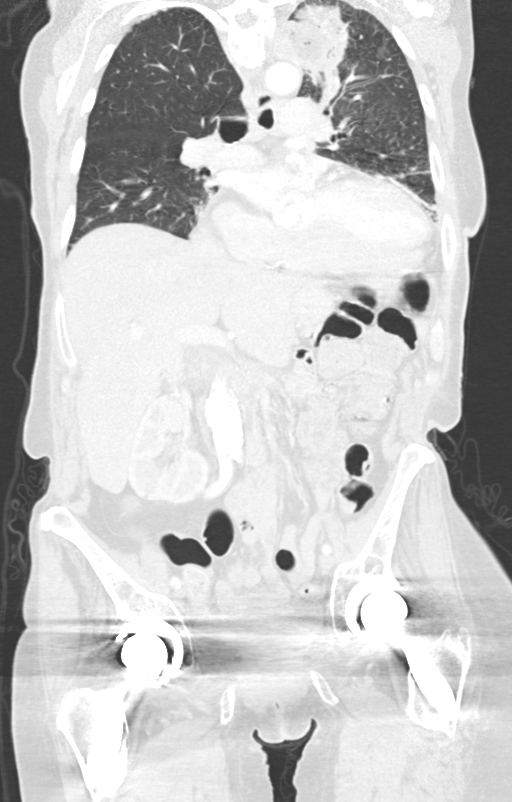
[im 82/136  lung]
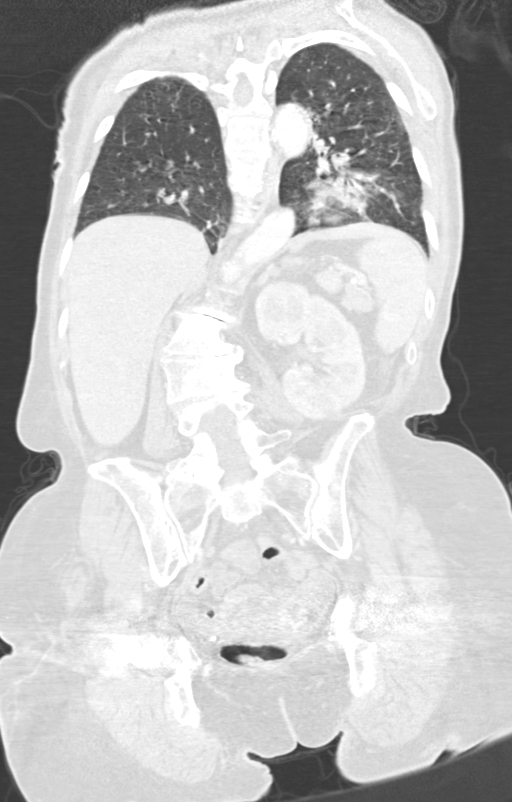

[12 of 36 positions shown; findings below may reference images not displayed]

FINDINGS: CT CHEST FINDINGS

Cardiovascular: There is pectus excavatum, accentuating the axis of
the heart. Extensive coronary artery calcifications. Mitral
calcifications. The thoracic aorta is partially calcified but not
aneurysmal. The pulmonary arteries are well opacified accounting for
the contrast bolus timing.

Mediastinum/Nodes: There is a complex air-debris collection within
the superior mediastinum, posterior to the upper esophagus.
Collection measures 5.8 x 2.6 x 7.3 cm and is centered at T1-2. This
is immediately adjacent to the left apical cavitary lesion,
described below.

Lungs/Pleura: Within the left lung apex, there is a thick-walled
cavitary lesion which measures 6.3 x 6.7 cm. Air-fluid level is
identified within this lesion. This cavitary lesion is contiguous
with the mediastinal air -debris collection. There are reticular
changes involving the left lung, right middle lobe, and medial right
lower lobe. Large airways are patent. There is atelectasis at both
lung bases, left greater than right. A small pulmonary nodule is
identified in the right upper lobe, measuring 5 mm on image 36 of
series 9.

Musculoskeletal: Increased sclerosis of incompletely imaged C7, T1,
and T2, suspicious for metastatic disease. Degenerative changes are
seen in the thoracic and lumbar spine. Bilateral hip arthroplasty.

Additional: Right mastectomy.

CT ABDOMEN PELVIS FINDINGS

Hepatobiliary: There is mild intrahepatic biliary duct dilatation.
Gallbladder is distended.

Pancreas: Unremarkable. No pancreatic ductal dilatation or
surrounding inflammatory changes.

Spleen: Normal in size without focal abnormality.

Adrenals/Urinary Tract: Normal adrenal glands. Small bilateral renal
cysts. Urinary bladder is partially obscured by significant artifact
in the pelvis.

Stomach/Bowel: Visible tract from previous gastrostomy tube.
Otherwise the stomach is normal in appearance. No bowel dilatation.
Pelvic loops are partially obscured by metallic artifact.

Vascular/Lymphatic: There is atherosclerotic calcification of the
abdominal aorta, measuring up to 2.8 cm. No significant adenopathy
in the pelvis.

Reproductive: Uterus is obscured or absent.

Other: None

Musculoskeletal: Scoliosis and significant degenerative changes in
the thoracolumbar spine.
IMPRESSION: 1. Complex air and debris collection posterior to the esophagus in
the superior mediastinum and contiguous with a large bowel cavitary
lesion in the left upper lobe. The findings are consistent with
esophageal/phalangeal perforation and abscess formation.
2. Sclerotic vertebral bodies of C7, T1, and T2, suspicious for
metastatic disease given the history of right breast cancer.
3. Further evaluation with CT of the neck with intravenous contrast
is recommended to evaluate the upper extent of the retroperitoneal
abscess.
4. Coronary artery disease.
5.  Aortic atherosclerosis.  ([id number])
6. Abdominal aortic atherosclerosis. Ectatic abdominal aorta at risk
for aneurysm development. Recommend followup by ultrasound in 5
years. This recommendation follows ACR consensus guidelines: White
Paper of the ACR Incidental Findings Committee II on Vascular
Findings. [HOSPITAL] 1047; [DATE].
7. The findings are discussed with Dr. Grobert.

## 2019-07-22 IMAGING — CR DG CHEST 2V
2 series · 2 of 2 positions shown · non-contrast
Comparison: November 08, 2016

CLINICAL DATA: Weakness.  History of breast cancer.

EXAM:
CHEST  2 VIEW

[w chest lat]
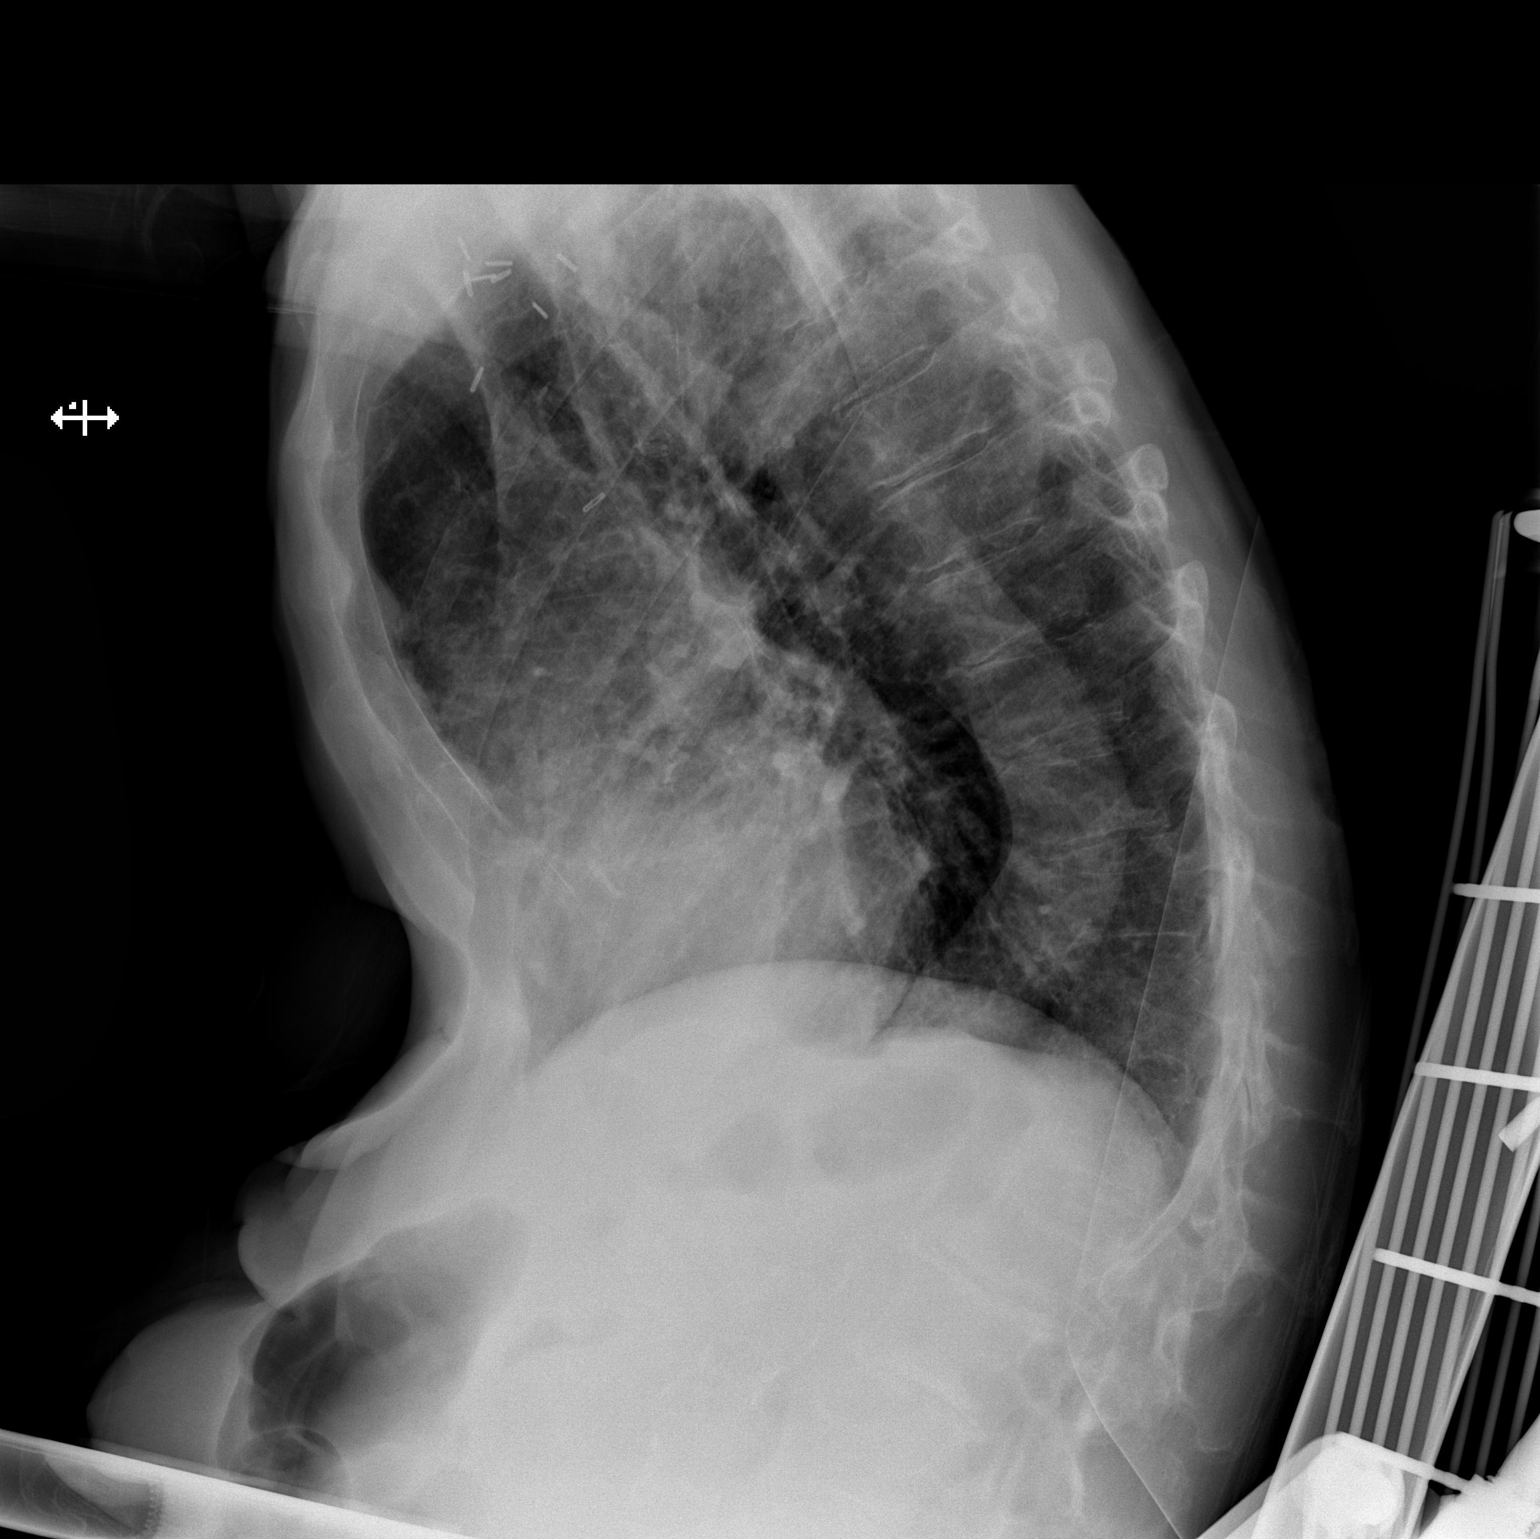

[x chest ap]
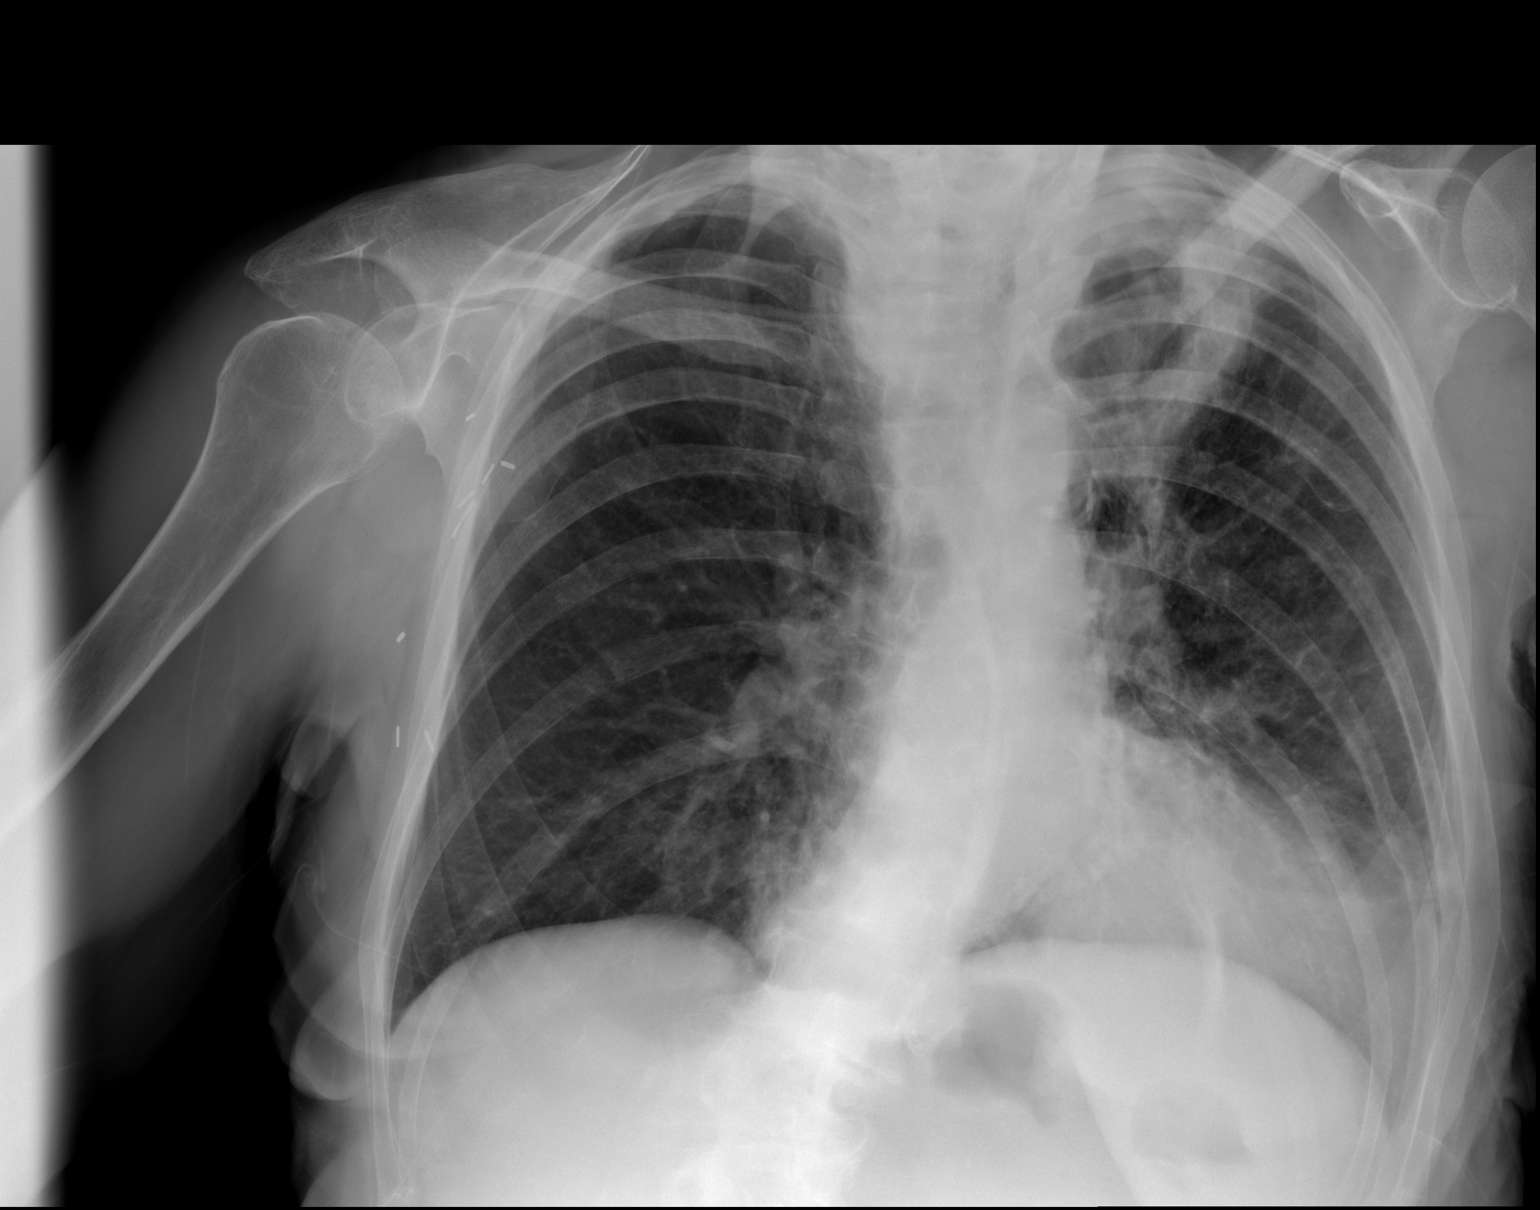

[2 of 2 positions shown; findings below may reference images not displayed]

FINDINGS: There is a rounded cavitated masslike region in the medial left
upper lung. Mild patchy opacity in the left lower lung suspected. No
pneumothorax. The right lung is clear. No other definitive changes.
IMPRESSION: There is a masslike cavitated region in the medial left upper lung
which could represent a cavitated neoplasm or cavitated infectious
process. There is also suspicion of patchy opacity throughout the
left lower lung. Recommend CT imaging for better evaluation.

## 2019-07-24 IMAGING — XA IR REPLACE G-TUBE/COLONIC TUBE
1 series · 1 of 1 positions shown · non-contrast
Comparison: none

CLINICAL DATA: Inadvertent removal of long-term indwelling
gastrostomy catheter approximately 6 days ago. Replacement is
requested.

EXAM:
GASTROSTOMY CATHETER REPLACEMENT
FLUOROSCOPY TIME:  1.3 minutes; 54 uDymB DAP
TECHNIQUE: The procedure, risks, benefits, and alternatives were explained to
the patient. Questions regarding the procedure were encouraged and
answered. The patient understands and consents to the procedure.

[Series 300: ir replace g-tube complex wo fluoro (tra · 1 of 1 slices shown]
[im 1/1]
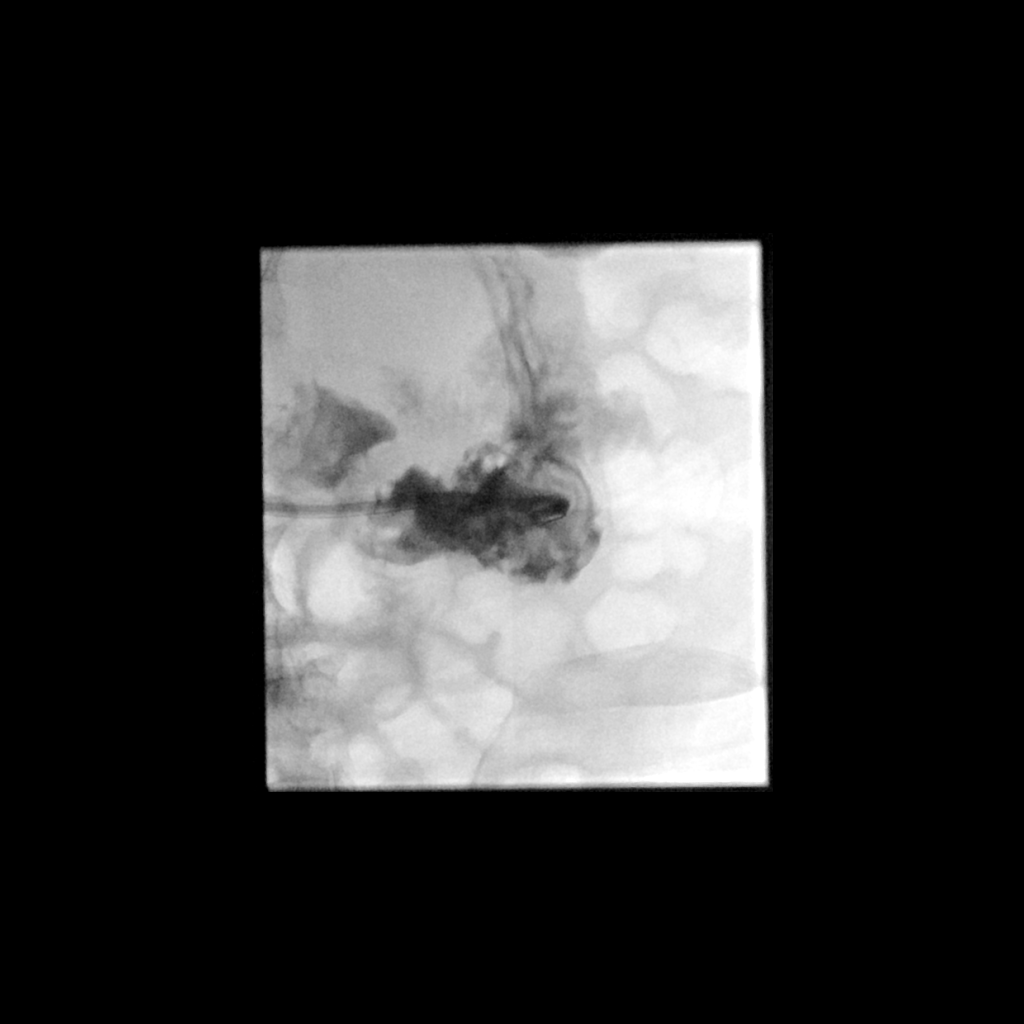

[1 of 1 positions shown; findings below may reference images not displayed]

The site of prior gastrostomy catheter was prepped and draped in
usual sterile fashion. Viscous lidocaine was applied liberally.

Under fluoroscopic guidance, a short 5 French Kumpe the catheter was
advanced through the tract into the lumen of the stomach, position
confirmed with a small contrast injection. The stomach is
decompressed. Contrast moved on into the decompressed duodenum.

The catheter was exchanged over a short Amplatz wire for serial
vascular dilators which facilitated placement of an 18 French
balloon retention gastrostomy catheter. The retention balloon was
inflated with 7 mL sterile saline. Contrast injection under
fluoroscopy confirms were appropriate positioning within the
stomach. The external bumper was applied. The catheter was capped.
The patient tolerated the procedure well.

COMPLICATIONS:
COMPLICATIONS
none
IMPRESSION: 1. Technically successful replacement of 18 French balloon retention
gastrostomy catheter under fluoroscopy.

## 2019-07-29 DIAGNOSIS — Z4789 Encounter for other orthopedic aftercare: Secondary | ICD-10-CM | POA: Diagnosis not present

## 2019-07-29 DIAGNOSIS — S7221XD Displaced subtrochanteric fracture of right femur, subsequent encounter for closed fracture with routine healing: Secondary | ICD-10-CM | POA: Diagnosis not present

## 2019-08-16 DIAGNOSIS — R300 Dysuria: Secondary | ICD-10-CM | POA: Diagnosis not present

## 2019-10-22 NOTE — Progress Notes (Signed)
10/24/2019   PCP: Vernie Shanks, MD   Chief Complaint  Patient presents with  . Coronary Artery Disease    Primary Cardiologist: Dr. P. [place]    HPI:  [name] is seen for follow up CAD. She has a history of coronary disease and has had multiple angioplasty procedures of the first obtuse marginal vessel. She is status post stenting of the right coronary using a 3.0 x 18 mm Tetra stent in 2000. Her last cardiac catheterization in July of 2010 showed nonobstructive disease. Her myoview study in 07/2014 was normal. She has a history of tobacco and Etoh abuse. She has chronic depression. Her last Echo 08/2014 with EF 60-65% moderate MR, PA peak pressure 39 mm Hg overall poor quality.  She was hospitalized  in Dec. 2015 for sepsis due to PNA and UTI.  During this acute episode she had rapid atrial fib and mild elevation of troponin at 1.20.  Prior to discharge she was in SR, it was felt no anticoagulation was warranted for brief episode of PAF with acute illness.  Her troponin elevation was felt to be due to demand ischemia. myoview in November 2015 was normal.   She later dislocated her hip. She was having progressive swallowing problems. She was diagnosed with cervical discitis with spinal canal stenosis. She was found to have an epidural abscess extending into the mediastinum and had an esophageal perforation. This required surgical exploration with removal of hardware. She lost a significant amount of weight with all this. In February 2017 she did undergo successful revision of her hip.   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 She had a feeding gastrostomy tube placed.  This was managed with chronic antibiotics.   She was admitted in January 2019 with recurrent abscess extending into the left mediastinum. On 09/28/2017 she underwent a left video-assisted thoracoscopic  exploration, thoracoscopic pneumolysis, thoracoscopic drainage of posterior mediastinal abscess and fistulous tract and a thoracoscopic wedge resection/sublobar resection of an apical posterior portion of her left upper lobe for an abscess that all resulted from a leaking Zenker's diverticulectomy. 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 underwent Left neck exploration of fistula site with irrigation and placement of repeat sternocleidomastoid rotation flap by Dr. Vicie Mutters at [facility] on 11/26/17 that supported the fistula site and obliterated the pathway into mediastinum. Readmitted in June with neck swelling. Swallowing evaluation revealed  focal outpouching arising from the posterior hypopharynx consistent with a Zenker's diverticulum, which is similar in size compared to prior exam.  New fistulous tract projecting superiorly from the diverticulum within the retropharyngeal/prevertebral soft tissue with a possible connection back into the posterior pharynx. In October she was admitted and underwent on 06/22/18 Open excision of fistula tract and repair of Zenker's diverticulum using the contour curved cutter stapler (Ethicon); sternocleidomastoid flap. Esophagram in November showed normal no diverticulum or leak.   She was admitted after a mechanical fall in September 2020 with a spiral fracture of the right femoral shaft and she underwent ORIF.    On follow up today she is doing OK. She has regained her weight and more from her prior surgeries. She does not monitor BP at home. Is not on BP or cholesterol therapy. States these were stopped when she lost all her weight. She is still smoking. Denies chest pain or SOB. Has  chronic LE edema and wears support hose.     Allergies  Allergen Reactions  . Cephalexin Other (See Comments)    Took off first layer of skin inside of mouth  . Chlorhexidine   . Zithromax [Azithromycin Dihydrate] Other (See Comments)    ORAL  ULCERS     Current Outpatient Medications  Medication Sig Dispense Refill  . albuterol (PROVENTIL HFA;VENTOLIN HFA) 108 (90 BASE) MCG/ACT inhaler Inhale 2 puffs into the lungs every 6 (six) hours as needed for wheezing.     Marland Kitchen ALPRAZolam (XANAX) 0.25 MG tablet Take one tablet daily or as needed 90 tablet 3  . aspirin EC 81 MG EC tablet Take 1 tablet (81 mg total) by mouth 2 (two) times daily. 60 tablet 0  . budesonide-formoterol (SYMBICORT) 160-4.5 MCG/ACT inhaler Inhale 2 puffs into the lungs 2 (two) times daily as needed (Wheezing). Reported on 10/16/2015    . diclofenac (VOLTAREN) 75 MG EC tablet Take 75 mg by mouth daily as needed for pain.    Marland Kitchen diclofenac sodium (VOLTAREN) 1 % GEL Apply 1 application topically 4 (four) times daily as needed for pain.    Marland Kitchen gabapentin (NEURONTIN) 600 MG tablet Take 600 mg by mouth 4 (four) times daily.     Marland Kitchen morphine (MS CONTIN) 30 MG 12 hr tablet Take 30 mg by mouth 4 (four) times daily.     Marland Kitchen oxyCODONE (ROXICODONE) 15 MG immediate release tablet Take 15 mg by mouth 4 (four) times daily.    . ferrous sulfate 325 (65 FE) MG tablet Take 1 tablet (325 mg total) by mouth 2 (two) times daily with a meal. 60 tablet 3  . furosemide (LASIX) 20 MG tablet Take 1 tablet (20 mg total) by mouth daily. 90 tablet 3  . losartan (COZAAR) 50 MG tablet Take 1 tablet (50 mg total) by mouth daily. 90 tablet 3  . nicotine (NICODERM CQ - DOSED IN MG/24 HOURS) 14 mg/24hr patch Place 1 patch (14 mg total) onto the skin daily. 28 patch 0  . nitroGLYCERIN (NITROSTAT) 0.4 MG SL tablet Place 1 tablet (0.4 mg total) under the tongue every 5 (five) minutes as needed for chest pain. 90 tablet 3  . pantoprazole (PROTONIX) 40 MG tablet Take 1 tablet (40 mg total) by mouth daily. (Patient not taking: Reported on 10/24/2019) 30 tablet 0  . rosuvastatin (CRESTOR) 10 MG tablet Take 1 tablet (10 mg total) by mouth daily. 90 tablet 3   No current facility-administered medications for this visit.     Past Medical History:  Diagnosis Date  . Asthma   . Breast cancer ([place])    right breast  . COPD (chronic obstructive pulmonary disease) ([place])   . Coronary artery disease   . Diverticulum of esophagus   . Elevated LFTs   . Emphysema lung ([place])   . ETOH abuse   . H/O atrial fibrillation without current medication    only one time when she had sepsis  . Hyperlipidemia   . Hypertension    hx of but not on any medications  . Myocardial infarction ([place]) 2000  . OA (osteoarthritis) of knee   . Osteoarthritis   . Tobacco abuse     Past Surgical History:  Procedure Laterality Date  . ANTERIOR HIP REVISION Right 10/22/2015   Procedure: RIGHT  HIP REVISION;  Surgeon: Paralee Cancel, MD;  Location: [facility];  Service: Orthopedics;  Laterality: Right;  . APPLICATION OF WOUND VAC N/A 06/20/2015   Procedure:  APPLICATION OF INCISIONAL WOUND VAC;  Surgeon: Melina Schools, MD;  Location: [place];  Service: Orthopedics;  Laterality: N/A;  . BREAST SURGERY  1991   right mastectomy  . CARDIAC CATHETERIZATION  04/05/2009   EF 60%  . CARDIOVASCULAR STRESS TEST  01/31/2005   EF 58%  . CESAREAN SECTION  '78, '80, '81   x 3  . CORONARY ANGIOPLASTY  08/1998   x2 OF A BIFURCATION OM-1, OM-2 LESION  . CORONARY ANGIOPLASTY WITH STENT PLACEMENT  01/1999   MID FIRST OBTUSE MARGINAL VESSEL  . CORONARY ANGIOPLASTY WITH STENT PLACEMENT  07/1999   STENTING AT THE CRUX OF THE RIGHT CORONARY ARTERY WITH A 3.8MM X 18MM TETRA STENT  . DIRECT LARYNGOSCOPY N/A 05/03/2015   Procedure: DIRECT LARYNGOSCOPY;  Surgeon: Jodi Marble, MD;  Location: [facility];  Service: ENT;  Laterality: N/A;  . EYE SURGERY  05/18/2014,06/01/2014   BILATERAL CATARACT S WITH LENS IMPLANTS  . GASTROSTOMY N/A 05/04/2015   Procedure: OPEN GASTROSTOMY WITH TUBE PLACEMENT;  Surgeon: Donnie Mesa, MD;  Location: [place];  Service: General;  Laterality: N/A;  . GASTROSTOMY N/A 11/13/2016   Procedure: INSERTION OF GASTROSTOMY TUBE;  Surgeon: Coralie Keens, MD;  Location: [place];  Service: General;  Laterality: N/A;  . HARDWARE REMOVAL N/A 05/03/2015   Procedure: HARDWARE REMOVAL;  Surgeon: Melina Schools, MD;  Location: [place];  Service: Orthopedics;  Laterality: N/A;  . HIP CLOSED REDUCTION Right 04/26/2015   Procedure: CLOSED REDUCTION HIP;  Surgeon: Melina Schools, MD;  Location: [facility];  Service: Orthopedics;  Laterality: Right;  . HYSTEROSCOPY     D & C  . INCISION AND DRAINAGE ABSCESS N/A 05/03/2015   Procedure: INCISION AND DRAINAGE CERVICAL  ABSCESS AND REMOVAL OF HARDWARE;  Surgeon: Melina Schools, MD;  Location: [place];  Service: Orthopedics;  Laterality: N/A;  . IR CM INJ ANY COLONIC TUBE W/FLUORO  10/14/2017  . IR CM INJ ANY COLONIC TUBE W/FLUORO  10/23/2017  . IR CM INJ ANY COLONIC TUBE W/FLUORO  10/28/2017  . IR [place] PERCUT W/FLUORO  09/22/2017  . JOINT REPLACEMENT  08/2011   bilateral hip  . JOINT REPLACEMENT  01/2012   right hip  . MASTECTOMY    . neck fusion  2011  . ORIF FEMUR FRACTURE Right 06/14/2019   Procedure: ORIF PERI PROSTHETIC FEMUR FRACTURE;  Surgeon: Paralee Cancel, MD;  Location: [place];  Service: Orthopedics;  Laterality: Right;  . PELVIC LAPAROSCOPY  2002   RSO-    . RADICAL NECK DISSECTION N/A 11/08/2016   Procedure: INCISION AND DRAINAGE OF NECK ABSCESS;  Surgeon: Jerrell Belfast, MD;  Location: [place];  Service: ENT;  Laterality: N/A;  . RADIOLOGY WITH ANESTHESIA Right 06/28/2015   Procedure: MRI OF CERVICAL SPINE  AND RIGHT HIP  WITH AND WITHOUT CONTRAST    (RADIOLOGY WITH ANESTHESIA);  Surgeon: Medication Radiologist, MD;  Location: [place];  Service: Radiology;  Laterality: Right;  . REMOVAL OF GASTROSTOMY TUBE N/A 11/14/2016   Procedure: REMOVAL OF GASTROSTOMY TUBE W/ REPLACEMENT OF GASTROSTOMY TUBE;  Surgeon: Coralie Keens, MD;  Location: [place];  Service: General;  Laterality: N/A;  . RIGID ESOPHAGOSCOPY N/A 05/03/2015   Procedure: RIGID ESOPHAGOSCOPY;  Surgeon: Jodi Marble, MD;   Location: [place];  Service: ENT;  Laterality: N/A;  . TONSILLECTOMY AND ADENOIDECTOMY    . TOTAL HIP ARTHROPLASTY  08/2010   bilat  . VULVECTOMY  1981   partial    ROS:As noted in  HPI. All other systems are reviewed and are negative.   Wt Readings from Last 3 Encounters:  10/24/19 163 lb 6.4 oz (74.1 kg)  06/13/19 149 lb 14.6 oz (68 kg)  09/20/18 147 lb 9.6 oz (67 kg)    PHYSICAL EXAM BP (!) 155/70   Pulse 72   Temp (!) 97.3 F (36.3 C)   Ht 5\' 2"  (1.575 m)   Wt 163 lb 6.4 oz (74.1 kg)   SpO2 95%   BMI 29.89 kg/m  GENERAL:  Well appearing WF in NAD HEENT:  PERRL, EOMI, sclera are clear. Oropharynx is clear. NECK:  No jugular venous distention, carotid upstroke brisk and symmetric, no bruits, no thyromegaly or adenopathy LUNGS:  Clear to auscultation bilaterally CHEST:  Unremarkable HEART:  RRR,  PMI not displaced or sustained,S1 and S2 within normal limits, no S3, no S4: no clicks, no rubs, no murmurs ABD:  Soft, nontender. Gastrostomy in place.  EXT:  2 + pulses throughout, 1-2+ edema, no cyanosis no clubbing SKIN:  Warm and dry.  No rashes NEURO:  Alert and oriented x 3. Cranial nerves II through XII intact. PSYCH:  Cognitively intact   Laboratory data:  Lab Results  Component Value Date   WBC 10.8 (H) 06/16/2019   HGB 10.7 (L) 06/16/2019   HCT 30.5 (L) 06/16/2019   PLT 157 06/16/2019   GLUCOSE 178 (H) 06/15/2019   CHOL 164 05/30/2011   TRIG 86.0 05/30/2011   HDL 57.10 05/30/2011   LDLCALC 90 05/30/2011   ALT 13 02/16/2018   AST 18 02/16/2018   NA 136 06/15/2019   K 3.7 06/15/2019   CL 100 06/15/2019   CREATININE 0.74 06/15/2019   BUN 10 06/15/2019   CO2 26 06/15/2019   TSH 2.120 08/26/2014   INR 1.0 06/13/2019   HGBA1C 5.8 (H) 11/10/2016   Dated 04/22/16: cholesterol 186, triglycerides 273, HDL 45, LDL 86.  Dated 05/11/17 A1c 6.3%.  Dated 08/09/18: Hgb 12.2. BMET normal.  Dated 11/16/18: A1c 6.4%. LFTs normal.  Ecg today shows NSR with LVH and  repolarization abnormality.   Rate 72. I have personally reviewed and interpreted this study.  ASSESSMENT AND PLAN 1.  CAD- multiple remote interventions. Normal myoview in October 2015. No active angina. Ecg today is unchanged. Continue ASA. Resume statin with Crestor 10 mg daily.  2. HTN poorly controlled. Will add losartan 50 mg daily.   3. Paroxysmal Afib- only noted during episode of sepsis. No recurrent symptoms.   4. Tobacco abuse. Counseled her on smoking cessation.  5. 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 Multiple surgical procedures within the last 2 years. Reports this is now closed.   6. COPD  7. LE edema. related to venous stasis. Agree with compression hose and lasix. Needs to limit salt more.  8. Hypercholesterolemia. Resume Crestor 10 mg daily  Follow up in 2 months with fasting lab work

## 2019-10-24 ENCOUNTER — Ambulatory Visit: Payer: Medicare Other | Admitting: Cardiology

## 2019-10-24 ENCOUNTER — Other Ambulatory Visit: Payer: Self-pay

## 2019-10-24 ENCOUNTER — Encounter: Payer: Self-pay | Admitting: Cardiology

## 2019-10-24 VITALS — BP 155/70 | HR 72 | Temp 97.3°F | Ht 62.0 in | Wt 163.4 lb

## 2019-10-24 DIAGNOSIS — E7800 Pure hypercholesterolemia, unspecified: Secondary | ICD-10-CM

## 2019-10-24 DIAGNOSIS — I10 Essential (primary) hypertension: Secondary | ICD-10-CM | POA: Diagnosis not present

## 2019-10-24 DIAGNOSIS — I2510 Atherosclerotic heart disease of native coronary artery without angina pectoris: Secondary | ICD-10-CM

## 2019-10-24 MED ORDER — ROSUVASTATIN CALCIUM 10 MG PO TABS: 10.0000 mg | ORAL_TABLET | Freq: Every day | ORAL | 3 refills | Status: DC

## 2019-10-24 MED ORDER — LOSARTAN POTASSIUM 50 MG PO TABS: 50.0000 mg | ORAL_TABLET | Freq: Every day | ORAL | 3 refills | Status: DC

## 2019-10-24 NOTE — Patient Instructions (Signed)
Start losartan 50 mg daily for blood pressure  Start Crestor 10 mg daily for cholesterol  Stop smoking  We will follow up in 2 months with fasting labs.

## 2019-12-19 NOTE — Progress Notes (Deleted)
12/19/2019   PCP: Vernie Shanks, MD   No chief complaint on file.   Primary Cardiologist: Dr. P. Martinique    HPI:  [name] is seen for follow up CAD. She has a history of coronary disease and has had multiple angioplasty procedures of the first obtuse marginal vessel. She is status post stenting of the right coronary using a 3.0 x 18 mm Tetra stent in 2000. Her last cardiac catheterization in July of 2010 showed nonobstructive disease. Her myoview study in 07/2014 was normal. She has a history of tobacco and Etoh abuse. She has chronic depression. Her last Echo 08/2014 with EF 60-65% moderate MR, PA peak pressure 39 mm Hg overall poor quality.  She was hospitalized  in Dec. 2015 for sepsis due to PNA and UTI.  During this acute episode she had rapid atrial fib and mild elevation of troponin at 1.20.  Prior to discharge she was in SR, it was felt no anticoagulation was warranted for brief episode of PAF with acute illness.  Her troponin elevation was felt to be due to demand ischemia. myoview in November 2015 was normal.   She later dislocated her hip. She was having progressive swallowing problems. She was diagnosed with cervical discitis with spinal canal stenosis. She was found to have an epidural abscess extending into the mediastinum and had an esophageal perforation. This required surgical exploration with removal of hardware. She lost a significant amount of weight with all this. In February 2017 she did undergo successful revision of her hip.   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 She had a feeding gastrostomy tube placed.  This was managed with chronic antibiotics.   She was admitted in January 2019 with recurrent abscess extending into the left mediastinum. On 09/28/2017 she underwent a left video-assisted thoracoscopic exploration, thoracoscopic pneumolysis,  thoracoscopic drainage of posterior mediastinal abscess and fistulous tract and a thoracoscopic wedge resection/sublobar resection of an apical posterior portion of her left upper lobe for an abscess that all resulted from a leaking Zenker's diverticulectomy. 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 Vicie Mutters at [facility] on 11/26/17 that supported the fistula site and obliterated the pathway into mediastinum. Readmitted in June with neck swelling. Swallowing evaluation revealed  focal outpouching arising from the posterior hypopharynx consistent with a Zenker's diverticulum, which is similar in size compared to prior exam.  New fistulous tract projecting superiorly from the diverticulum within the retropharyngeal/prevertebral soft tissue with a possible connection back into the posterior pharynx. In October she was admitted and underwent on 06/22/18 Open excision of fistula tract and repair of Zenker's diverticulum using the contour curved cutter stapler (Ethicon); sternocleidomastoid flap. Esophagram in November showed normal no diverticulum or leak.   She was admitted after a mechanical fall in September 2020 with a spiral fracture of the right femoral shaft and she underwent ORIF.    On follow up today she is doing OK. She has regained her weight and more from her prior surgeries. She does not monitor BP at home. Is not on BP or cholesterol therapy. States these were stopped when she lost all her weight. She is still smoking. Denies chest pain or SOB. Has chronic LE edema and wears support hose.  Allergies  Allergen Reactions  . Cephalexin Other (See Comments)    Took off first layer of skin inside of mouth  . Chlorhexidine   . Zithromax [Azithromycin Dihydrate] Other (See Comments)    ORAL ULCERS     Current Outpatient Medications    Medication Sig Dispense Refill  . albuterol (PROVENTIL HFA;VENTOLIN HFA) 108 (90 BASE) MCG/ACT inhaler Inhale 2 puffs into the lungs every 6 (six) hours as needed for wheezing.     Marland Kitchen ALPRAZolam (XANAX) 0.25 MG tablet Take one tablet daily or as needed 90 tablet 3  . aspirin EC 81 MG EC tablet Take 1 tablet (81 mg total) by mouth 2 (two) times daily. 60 tablet 0  . budesonide-formoterol (SYMBICORT) 160-4.5 MCG/ACT inhaler Inhale 2 puffs into the lungs 2 (two) times daily as needed (Wheezing). Reported on 10/16/2015    . diclofenac (VOLTAREN) 75 MG EC tablet Take 75 mg by mouth daily as needed for pain.    Marland Kitchen diclofenac sodium (VOLTAREN) 1 % GEL Apply 1 application topically 4 (four) times daily as needed for pain.    . ferrous sulfate 325 (65 FE) MG tablet Take 1 tablet (325 mg total) by mouth 2 (two) times daily with a meal. 60 tablet 3  . furosemide (LASIX) 20 MG tablet Take 1 tablet (20 mg total) by mouth daily. 90 tablet 3  . gabapentin (NEURONTIN) 600 MG tablet Take 600 mg by mouth 4 (four) times daily.     Marland Kitchen losartan (COZAAR) 50 MG tablet Take 1 tablet (50 mg total) by mouth daily. 90 tablet 3  . morphine (MS CONTIN) 30 MG 12 hr tablet Take 30 mg by mouth 4 (four) times daily.     . nicotine (NICODERM CQ - DOSED IN MG/24 HOURS) 14 mg/24hr patch Place 1 patch (14 mg total) onto the skin daily. 28 patch 0  . nitroGLYCERIN (NITROSTAT) 0.4 MG SL tablet Place 1 tablet (0.4 mg total) under the tongue every 5 (five) minutes as needed for chest pain. 90 tablet 3  . oxyCODONE (ROXICODONE) 15 MG immediate release tablet Take 15 mg by mouth 4 (four) times daily.    . pantoprazole (PROTONIX) 40 MG tablet Take 1 tablet (40 mg total) by mouth daily. (Patient not taking: Reported on 10/24/2019) 30 tablet 0  . rosuvastatin (CRESTOR) 10 MG tablet Take 1 tablet (10 mg total) by mouth daily. 90 tablet 3   No current facility-administered medications for this visit.    Past Medical History:  Diagnosis Date  .  Asthma   . Breast cancer ([place])    right breast  . COPD (chronic obstructive pulmonary disease) ([place])   . Coronary artery disease   . Diverticulum of esophagus   . Elevated LFTs   . Emphysema lung ([place])   . ETOH abuse   . H/O atrial fibrillation without current medication    only one time when she had sepsis  . Hyperlipidemia   . Hypertension    hx of but not on any medications  . Myocardial infarction ([place]) 2000  . OA (osteoarthritis) of knee   . Osteoarthritis   . Tobacco abuse     Past Surgical History:  Procedure Laterality Date  . ANTERIOR HIP REVISION Right 10/22/2015   Procedure: RIGHT  HIP REVISION;  Surgeon: Paralee Cancel, MD;  Location: [facility];  Service: Orthopedics;  Laterality: Right;  . APPLICATION OF WOUND VAC N/A 06/20/2015   Procedure: APPLICATION OF INCISIONAL WOUND VAC;  Surgeon: Melina Schools, MD;  Location: [place];  Service: Orthopedics;  Laterality: N/A;  . BREAST SURGERY  1991   right mastectomy  . CARDIAC CATHETERIZATION  04/05/2009   EF 60%  . CARDIOVASCULAR STRESS TEST  01/31/2005   EF 58%  . CESAREAN SECTION  '78, '80, '81   x 3  . CORONARY ANGIOPLASTY  08/1998   x2 OF A BIFURCATION OM-1, OM-2 LESION  . CORONARY ANGIOPLASTY WITH STENT PLACEMENT  01/1999   MID FIRST OBTUSE MARGINAL VESSEL  . CORONARY ANGIOPLASTY WITH STENT PLACEMENT  07/1999   STENTING AT THE CRUX OF THE RIGHT CORONARY ARTERY WITH A 3.8MM X 18MM TETRA STENT  . DIRECT LARYNGOSCOPY N/A 05/03/2015   Procedure: DIRECT LARYNGOSCOPY;  Surgeon: Jodi Marble, MD;  Location: [facility];  Service: ENT;  Laterality: N/A;  . EYE SURGERY  05/18/2014,06/01/2014   BILATERAL CATARACT S WITH LENS IMPLANTS  . GASTROSTOMY N/A 05/04/2015   Procedure: OPEN GASTROSTOMY WITH TUBE PLACEMENT;  Surgeon: Donnie Mesa, MD;  Location: [place];  Service: General;  Laterality: N/A;  . GASTROSTOMY N/A 11/13/2016   Procedure: INSERTION OF GASTROSTOMY TUBE;  Surgeon: Coralie Keens, MD;  Location: [place];  Service: General;   Laterality: N/A;  . HARDWARE REMOVAL N/A 05/03/2015   Procedure: HARDWARE REMOVAL;  Surgeon: Melina Schools, MD;  Location: [place];  Service: Orthopedics;  Laterality: N/A;  . HIP CLOSED REDUCTION Right 04/26/2015   Procedure: CLOSED REDUCTION HIP;  Surgeon: Melina Schools, MD;  Location: [facility];  Service: Orthopedics;  Laterality: Right;  . HYSTEROSCOPY     D & C  . INCISION AND DRAINAGE ABSCESS N/A 05/03/2015   Procedure: INCISION AND DRAINAGE CERVICAL  ABSCESS AND REMOVAL OF HARDWARE;  Surgeon: Melina Schools, MD;  Location: [place];  Service: Orthopedics;  Laterality: N/A;  . IR CM INJ ANY COLONIC TUBE W/FLUORO  10/14/2017  . IR CM INJ ANY COLONIC TUBE W/FLUORO  10/23/2017  . IR CM INJ ANY COLONIC TUBE W/FLUORO  10/28/2017  . IR [facility] PERCUT W/FLUORO  09/22/2017  . JOINT REPLACEMENT  08/2011   bilateral hip  . JOINT REPLACEMENT  01/2012   right hip  . MASTECTOMY    . neck fusion  2011  . ORIF FEMUR FRACTURE Right 06/14/2019   Procedure: ORIF PERI PROSTHETIC FEMUR FRACTURE;  Surgeon: Paralee Cancel, MD;  Location: [facility];  Service: Orthopedics;  Laterality: Right;  . PELVIC LAPAROSCOPY  2002   RSO-    . RADICAL NECK DISSECTION N/A 11/08/2016   Procedure: INCISION AND DRAINAGE OF NECK ABSCESS;  Surgeon: Jerrell Belfast, MD;  Location: [place];  Service: ENT;  Laterality: N/A;  . RADIOLOGY WITH ANESTHESIA Right 06/28/2015   Procedure: MRI OF CERVICAL SPINE  AND RIGHT HIP  WITH AND WITHOUT CONTRAST    (RADIOLOGY WITH ANESTHESIA);  Surgeon: Medication Radiologist, MD;  Location: [place];  Service: Radiology;  Laterality: Right;  . REMOVAL OF GASTROSTOMY TUBE N/A 11/14/2016   Procedure: REMOVAL OF GASTROSTOMY TUBE W/ REPLACEMENT OF GASTROSTOMY TUBE;  Surgeon: Coralie Keens, MD;  Location: [place];  Service: General;  Laterality: N/A;  . RIGID ESOPHAGOSCOPY N/A 05/03/2015   Procedure: RIGID ESOPHAGOSCOPY;  Surgeon: Jodi Marble, MD;  Location: [facility];  Service: ENT;  Laterality: N/A;  .  TONSILLECTOMY AND ADENOIDECTOMY    . TOTAL HIP ARTHROPLASTY  08/2010   bilat  . VULVECTOMY  1981   partial    ROS:As noted in HPI. All other systems are reviewed and are negative.  Wt Readings from Last 3 Encounters:  10/24/19 163 lb 6.4 oz (74.1 kg)  06/13/19 149 lb 14.6 oz (68 kg)  09/20/18 147 lb 9.6 oz (67 kg)    PHYSICAL EXAM There were no vitals taken for this visit. GENERAL:  Well appearing WF in NAD HEENT:  PERRL, EOMI, sclera are clear. Oropharynx is clear. NECK:  No jugular venous distention, carotid upstroke brisk and symmetric, no bruits, no thyromegaly or adenopathy LUNGS:  Clear to auscultation bilaterally CHEST:  Unremarkable HEART:  RRR,  PMI not displaced or sustained,S1 and S2 within normal limits, no S3, no S4: no clicks, no rubs, no murmurs ABD:  Soft, nontender. Gastrostomy in place.  EXT:  2 + pulses throughout, 1-2+ edema, no cyanosis no clubbing SKIN:  Warm and dry.  No rashes NEURO:  Alert and oriented x 3. Cranial nerves II through XII intact. PSYCH:  Cognitively intact   Laboratory data:  Lab Results  Component Value Date   WBC 10.8 (H) 06/16/2019   HGB 10.7 (L) 06/16/2019   HCT 30.5 (L) 06/16/2019   PLT 157 06/16/2019   GLUCOSE 178 (H) 06/15/2019   CHOL 164 05/30/2011   TRIG 86.0 05/30/2011   HDL 57.10 05/30/2011   LDLCALC 90 05/30/2011   ALT 13 02/16/2018   AST 18 02/16/2018   NA 136 06/15/2019   K 3.7 06/15/2019   CL 100 06/15/2019   CREATININE 0.74 06/15/2019   BUN 10 06/15/2019   CO2 26 06/15/2019   TSH 2.120 08/26/2014   INR 1.0 06/13/2019   HGBA1C 5.8 (H) 11/10/2016   Dated 04/22/16: cholesterol 186, triglycerides 273, HDL 45, LDL 86.  Dated 05/11/17 A1c 6.3%.  Dated 08/09/18: Hgb 12.2. BMET normal.  Dated 11/16/18: A1c 6.4%. LFTs normal. Dated 06/16/19: Hgb 10.7   Ecg today shows NSR with LVH and repolarization abnormality.   Rate 72. I have personally reviewed and interpreted this study.  ASSESSMENT AND PLAN 1.  CAD-  multiple remote interventions. Normal myoview in October 2015. No active angina. Ecg today is unchanged. Continue ASA. Resume statin with Crestor 10 mg daily.  2. HTN poorly controlled. Will add losartan 50 mg daily.   3. Paroxysmal Afib- only noted during episode of sepsis. No recurrent symptoms.   4. Tobacco abuse. Counseled her on smoking cessation.  5. 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 Multiple surgical procedures within the last 2 years. Reports this is now closed.   6. COPD  7. LE edema. related to venous stasis. Agree with compression hose and lasix. Needs to limit salt more.  8. Hypercholesterolemia. Resume Crestor 10 mg daily  Follow up in 2 months with fasting lab work

## 2019-12-20 LAB — LIPID PANEL
Chol/HDL Ratio: 2.6 ratio (ref 0.0–4.4)
Cholesterol, Total: 135 mg/dL (ref 100–199)
HDL: 51 mg/dL (ref >39)
LDL Chol Calc (NIH): 70 mg/dL (ref 0–99)
Triglycerides: 66 mg/dL (ref 0–149)
VLDL Cholesterol Cal: 14 mg/dL (ref 5–40)

## 2019-12-20 LAB — BASIC METABOLIC PANEL
BUN/Creatinine Ratio: 25 (ref 12–28)
BUN: 16 mg/dL (ref 8–27)
CO2: 23 mmol/L (ref 20–29)
Calcium: 9.5 mg/dL (ref 8.7–10.3)
Chloride: 100 mmol/L (ref 96–106)
Creatinine, Ser: 0.65 mg/dL (ref 0.57–1.00)
GFR calc Af Amer: 103 mL/min/{1.73_m2} (ref >59)
GFR calc non Af Amer: 90 mL/min/{1.73_m2} (ref >59)
Glucose: 121 mg/dL — ABNORMAL HIGH (ref 65–99)
Potassium: 5 mmol/L (ref 3.5–5.2)
Sodium: 138 mmol/L (ref 134–144)

## 2019-12-20 LAB — HEPATIC FUNCTION PANEL
ALT: 15 IU/L (ref 0–32)
AST: 22 IU/L (ref 0–40)
Albumin: 4.4 g/dL (ref 3.7–4.7)
Alkaline Phosphatase: 182 IU/L — ABNORMAL HIGH (ref 39–117)
Bilirubin Total: 0.2 mg/dL (ref 0.0–1.2)
Bilirubin, Direct: 0.1 mg/dL (ref 0.00–0.40)
Total Protein: 7.2 g/dL (ref 6.0–8.5)

## 2019-12-22 ENCOUNTER — Ambulatory Visit: Payer: Medicare Other | Admitting: Cardiology

## 2019-12-26 NOTE — Progress Notes (Signed)
12/27/2019   PCP: Vernie Shanks, MD  Chief Complaint  Patient presents with  . Coronary Artery Disease  . Hypertension   Primary Cardiologist: Dr. P. Martinique    HPI:  [name] is seen for follow up CAD. She has a history of coronary disease and has had multiple angioplasty procedures of the first obtuse marginal vessel. She is status post stenting of the right coronary using a 3.0 x 18 mm Tetra stent in 2000. Her last cardiac catheterization in July of 2010 showed nonobstructive disease. Her myoview study in 07/2014 was normal. She has a history of tobacco and Etoh abuse. She has chronic depression. Her last Echo 08/2014 with EF 60-65% moderate MR, PA peak pressure 39 mm Hg overall poor quality.  She was hospitalized  in Dec. 2015 for sepsis due to PNA and UTI.  During this acute episode she had rapid atrial fib and mild elevation of troponin at 1.20.  Prior to discharge she was in SR, it was felt no anticoagulation was warranted for brief episode of PAF with acute illness.  Her troponin elevation was felt to be due to demand ischemia. myoview in November 2015 was normal.   She later dislocated her hip. She was having progressive swallowing problems. She was diagnosed with cervical discitis with spinal canal stenosis. She was found to have an epidural abscess extending into the mediastinum and had an esophageal perforation. This required surgical exploration with removal of hardware. She lost a significant amount of weight with all this. In February 2017 she did undergo successful revision of her hip.   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 She had a feeding gastrostomy tube placed.  This was managed with chronic antibiotics.   She was admitted in January 2019 with recurrent abscess extending into the left mediastinum. On 09/28/2017 she underwent a left video-assisted  thoracoscopic exploration, thoracoscopic pneumolysis, thoracoscopic drainage of posterior mediastinal abscess and fistulous tract and a thoracoscopic wedge resection/sublobar resection of an apical posterior portion of her left upper lobe for an abscess that all resulted from a leaking Zenker's diverticulectomy. 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 Vicie Mutters at [facility] on 11/26/17 that supported the fistula site and obliterated the pathway into mediastinum. Readmitted in June with neck swelling. Swallowing evaluation revealed  focal outpouching arising from the posterior hypopharynx consistent with a Zenker's diverticulum, which is similar in size compared to prior exam.  New fistulous tract projecting superiorly from the diverticulum within the retropharyngeal/prevertebral soft tissue with a possible connection back into the posterior pharynx. In October she was admitted and underwent on 06/22/18 Open excision of fistula tract and repair of Zenker's diverticulum using the contour curved cutter stapler (Ethicon); sternocleidomastoid flap. Esophagram in November showed normal no diverticulum or leak.   She was admitted after a mechanical fall in September 2020 with a spiral fracture of the right femoral shaft and she underwent ORIF.    On follow up today she is doing well. We added losartan back to her medication last visit and BP is much better. She is taking Crestor and lipids are improved. She is back smoking 1ppd. Is considering going back on Chantix which has worked for her in the past. She is walking  daily. Active in her garden.    Allergies  Allergen Reactions  . Cephalexin Other (See Comments)    Took off first layer of skin inside of mouth  . Chlorhexidine   . Zithromax [Azithromycin Dihydrate] Other (See Comments)    ORAL  ULCERS     Current Outpatient Medications  Medication Sig Dispense Refill  . albuterol (PROVENTIL HFA;VENTOLIN HFA) 108 (90 BASE) MCG/ACT inhaler Inhale 2 puffs into the lungs every 6 (six) hours as needed for wheezing.     Marland Kitchen ALPRAZolam (XANAX) 0.25 MG tablet Take one tablet daily or as needed 90 tablet 3  . aspirin EC 81 MG EC tablet Take 1 tablet (81 mg total) by mouth 2 (two) times daily. 60 tablet 0  . budesonide-formoterol (SYMBICORT) 160-4.5 MCG/ACT inhaler Inhale 2 puffs into the lungs 2 (two) times daily as needed (Wheezing). Reported on 10/16/2015    . diclofenac (VOLTAREN) 75 MG EC tablet Take 75 mg by mouth daily as needed for pain.    Marland Kitchen diclofenac sodium (VOLTAREN) 1 % GEL Apply 1 application topically 4 (four) times daily as needed for pain.    Marland Kitchen gabapentin (NEURONTIN) 600 MG tablet Take 600 mg by mouth 4 (four) times daily.     Marland Kitchen morphine (MS CONTIN) 30 MG 12 hr tablet Take 30 mg by mouth 4 (four) times daily.     . nicotine (NICODERM CQ - DOSED IN MG/24 HOURS) 14 mg/24hr patch Place 1 patch (14 mg total) onto the skin daily. 28 patch 0  . oxyCODONE (ROXICODONE) 15 MG immediate release tablet Take 15 mg by mouth 4 (four) times daily.    . furosemide (LASIX) 20 MG tablet Take 1 tablet (20 mg total) by mouth daily as needed for edema. 90 tablet 3  . losartan (COZAAR) 50 MG tablet Take 1 tablet (50 mg total) by mouth daily. 90 tablet 3  . nitroGLYCERIN (NITROSTAT) 0.4 MG SL tablet Place 1 tablet (0.4 mg total) under the tongue every 5 (five) minutes as needed for chest pain. 90 tablet 3  . rosuvastatin (CRESTOR) 10 MG tablet Take 1 tablet (10 mg total) by mouth daily. 90 tablet 3   No current facility-administered medications for this visit.    Past Medical History:  Diagnosis Date  . Asthma   . Breast cancer ([place])    right breast  . COPD (chronic obstructive pulmonary disease) ([place])   . Coronary artery disease   . Diverticulum of esophagus   . Elevated LFTs   . Emphysema  lung ([place])   . ETOH abuse   . H/O atrial fibrillation without current medication    only one time when she had sepsis  . Hyperlipidemia   . Hypertension    hx of but not on any medications  . Myocardial infarction ([place]) 2000  . OA (osteoarthritis) of knee   . Osteoarthritis   . Tobacco abuse     Past Surgical History:  Procedure Laterality Date  . ANTERIOR HIP REVISION Right 10/22/2015   Procedure: RIGHT  HIP REVISION;  Surgeon: Paralee Cancel, MD;  Location: [facility];  Service: Orthopedics;  Laterality: Right;  . APPLICATION OF WOUND VAC N/A 06/20/2015   Procedure: APPLICATION OF INCISIONAL WOUND VAC;  Surgeon: Melina Schools, MD;  Location: [place];  Service: Orthopedics;  Laterality: N/A;  . BREAST SURGERY  1991   right mastectomy  . CARDIAC CATHETERIZATION  04/05/2009   EF 60%  . CARDIOVASCULAR STRESS TEST  01/31/2005   EF 58%  .  CESAREAN SECTION  '78, '80, '81   x 3  . CORONARY ANGIOPLASTY  08/1998   x2 OF A BIFURCATION OM-1, OM-2 LESION  . CORONARY ANGIOPLASTY WITH STENT PLACEMENT  01/1999   MID FIRST OBTUSE MARGINAL VESSEL  . CORONARY ANGIOPLASTY WITH STENT PLACEMENT  07/1999   STENTING AT THE CRUX OF THE RIGHT CORONARY ARTERY WITH A 3.8MM X 18MM TETRA STENT  . DIRECT LARYNGOSCOPY N/A 05/03/2015   Procedure: DIRECT LARYNGOSCOPY;  Surgeon: Jodi Marble, MD;  Location: [place];  Service: ENT;  Laterality: N/A;  . EYE SURGERY  05/18/2014,06/01/2014   BILATERAL CATARACT S WITH LENS IMPLANTS  . GASTROSTOMY N/A 05/04/2015   Procedure: OPEN GASTROSTOMY WITH TUBE PLACEMENT;  Surgeon: Donnie Mesa, MD;  Location: [place];  Service: General;  Laterality: N/A;  . GASTROSTOMY N/A 11/13/2016   Procedure: INSERTION OF GASTROSTOMY TUBE;  Surgeon: Coralie Keens, MD;  Location: [place];  Service: General;  Laterality: N/A;  . HARDWARE REMOVAL N/A 05/03/2015   Procedure: HARDWARE REMOVAL;  Surgeon: Melina Schools, MD;  Location: [place];  Service: Orthopedics;  Laterality: N/A;  . HIP CLOSED REDUCTION Right  04/26/2015   Procedure: CLOSED REDUCTION HIP;  Surgeon: Melina Schools, MD;  Location: [facility];  Service: Orthopedics;  Laterality: Right;  . HYSTEROSCOPY     D & C  . INCISION AND DRAINAGE ABSCESS N/A 05/03/2015   Procedure: INCISION AND DRAINAGE CERVICAL  ABSCESS AND REMOVAL OF HARDWARE;  Surgeon: Melina Schools, MD;  Location: [place];  Service: Orthopedics;  Laterality: N/A;  . IR CM INJ ANY COLONIC TUBE W/FLUORO  10/14/2017  . IR CM INJ ANY COLONIC TUBE W/FLUORO  10/23/2017  . IR CM INJ ANY COLONIC TUBE W/FLUORO  10/28/2017  . IR [place] PERCUT W/FLUORO  09/22/2017  . JOINT REPLACEMENT  08/2011   bilateral hip  . JOINT REPLACEMENT  01/2012   right hip  . MASTECTOMY    . neck fusion  2011  . ORIF FEMUR FRACTURE Right 06/14/2019   Procedure: ORIF PERI PROSTHETIC FEMUR FRACTURE;  Surgeon: Paralee Cancel, MD;  Location: [place];  Service: Orthopedics;  Laterality: Right;  . PELVIC LAPAROSCOPY  2002   RSO-    . RADICAL NECK DISSECTION N/A 11/08/2016   Procedure: INCISION AND DRAINAGE OF NECK ABSCESS;  Surgeon: Jerrell Belfast, MD;  Location: [place];  Service: ENT;  Laterality: N/A;  . RADIOLOGY WITH ANESTHESIA Right 06/28/2015   Procedure: MRI OF CERVICAL SPINE  AND RIGHT HIP  WITH AND WITHOUT CONTRAST    (RADIOLOGY WITH ANESTHESIA);  Surgeon: Medication Radiologist, MD;  Location: [place];  Service: Radiology;  Laterality: Right;  . REMOVAL OF GASTROSTOMY TUBE N/A 11/14/2016   Procedure: REMOVAL OF GASTROSTOMY TUBE W/ REPLACEMENT OF GASTROSTOMY TUBE;  Surgeon: Coralie Keens, MD;  Location: [place];  Service: General;  Laterality: N/A;  . RIGID ESOPHAGOSCOPY N/A 05/03/2015   Procedure: RIGID ESOPHAGOSCOPY;  Surgeon: Jodi Marble, MD;  Location: [place];  Service: ENT;  Laterality: N/A;  . TONSILLECTOMY AND ADENOIDECTOMY    . TOTAL HIP ARTHROPLASTY  08/2010   bilat  . VULVECTOMY  1981   partial    ROS:As noted in HPI. All other systems are reviewed and are negative.   Wt Readings from  Last 3 Encounters:  12/27/19 148 lb 12.8 oz (67.5 kg)  10/24/19 163 lb 6.4 oz (74.1 kg)  06/13/19 149 lb 14.6 oz (68 kg)    PHYSICAL EXAM BP 130/80   Pulse 78  Temp (!) 97.3 F (36.3 C)   Ht 5\' 2"  (1.575 m)   Wt 148 lb 12.8 oz (67.5 kg)   SpO2 98%   BMI 27.22 kg/m  GENERAL:  Well appearing WF in NAD HEENT:  PERRL, EOMI, sclera are clear. Oropharynx is clear. NECK:  No jugular venous distention, carotid upstroke brisk and symmetric, no bruits, no thyromegaly or adenopathy LUNGS:  Scattered rhonchi.  CHEST:  Unremarkable HEART:  RRR,  PMI not displaced or sustained,S1 and S2 within normal limits, no S3, no S4: no clicks, no rubs, no murmurs ABD:  Soft, nontender. Gastrostomy in place.  EXT:  2 + pulses throughout, 1+ edema, no cyanosis no clubbing SKIN:  Warm and dry.  No rashes NEURO:  Alert and oriented x 3. Cranial nerves II through XII intact. PSYCH:  Cognitively intact   Laboratory data:  Lab Results  Component Value Date   WBC 10.8 (H) 06/16/2019   HGB 10.7 (L) 06/16/2019   HCT 30.5 (L) 06/16/2019   PLT 157 06/16/2019   GLUCOSE 121 (H) 12/20/2019   CHOL 135 12/20/2019   TRIG 66 12/20/2019   HDL 51 12/20/2019   LDLCALC 70 12/20/2019   ALT 15 12/20/2019   AST 22 12/20/2019   NA 138 12/20/2019   K 5.0 12/20/2019   CL 100 12/20/2019   CREATININE 0.65 12/20/2019   BUN 16 12/20/2019   CO2 23 12/20/2019   TSH 2.120 08/26/2014   INR 1.0 06/13/2019   HGBA1C 5.8 (H) 11/10/2016   Dated 04/22/16: cholesterol 186, triglycerides 273, HDL 45, LDL 86.  Dated 05/11/17 A1c 6.3%.  Dated 08/09/18: Hgb 12.2. BMET normal.  Dated 11/16/18: A1c 6.4%. LFTs normal. Dated 06/16/19: Hgb 10.7     ASSESSMENT AND PLAN 1.  CAD- multiple remote interventions. Normal myoview in October 2015. No active angina.  Continue ASA. On  Crestor 10 mg daily.  2. HTN now well controlled on losartan 50 mg daily.   3. Paroxysmal Afib- only noted during episode of sepsis. No recurrent symptoms.    4. Tobacco abuse. Counseled her on smoking cessation. Discussed trying Chantix again- she is going to let me know when she is ready.   5. 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 Multiple surgical procedures within the last 2 years. Reports this is now closed.   6. COPD  7. LE edema. related to venous stasis. Agree with compression hose and lasix. Doesn't take lasix daily.  Needs to limit salt more.  8. Hypercholesterolemia. LDL 70 on Crestor 10 mg daily  Follow up in 6 months

## 2019-12-27 ENCOUNTER — Encounter: Payer: Self-pay | Admitting: Cardiology

## 2019-12-27 ENCOUNTER — Ambulatory Visit: Payer: Medicare Other | Admitting: Cardiology

## 2019-12-27 ENCOUNTER — Other Ambulatory Visit: Payer: Self-pay

## 2019-12-27 VITALS — BP 130/80 | HR 78 | Temp 97.3°F | Ht 62.0 in | Wt 148.8 lb

## 2019-12-27 DIAGNOSIS — I2510 Atherosclerotic heart disease of native coronary artery without angina pectoris: Secondary | ICD-10-CM | POA: Diagnosis not present

## 2019-12-27 DIAGNOSIS — R600 Localized edema: Secondary | ICD-10-CM | POA: Diagnosis not present

## 2019-12-27 DIAGNOSIS — E7800 Pure hypercholesterolemia, unspecified: Secondary | ICD-10-CM

## 2019-12-27 DIAGNOSIS — I10 Essential (primary) hypertension: Secondary | ICD-10-CM

## 2019-12-27 MED ORDER — LOSARTAN POTASSIUM 50 MG PO TABS: 50.0000 mg | ORAL_TABLET | Freq: Every day | ORAL | 3 refills | Status: DC

## 2019-12-27 MED ORDER — ROSUVASTATIN CALCIUM 10 MG PO TABS: 10.0000 mg | ORAL_TABLET | Freq: Every day | ORAL | 3 refills | Status: DC

## 2019-12-27 MED ORDER — FUROSEMIDE 20 MG PO TABS: 20.0000 mg | ORAL_TABLET | Freq: Every day | ORAL | 3 refills | Status: DC | PRN

## 2019-12-27 NOTE — Patient Instructions (Signed)
Quit smoking.  Continue current therapy  Follow up in 6 months

## 2020-03-27 ENCOUNTER — Other Ambulatory Visit: Payer: Self-pay | Admitting: Family Medicine

## 2020-03-27 ENCOUNTER — Other Ambulatory Visit (HOSPITAL_COMMUNITY): Payer: Self-pay | Admitting: Family Medicine

## 2020-03-27 ENCOUNTER — Ambulatory Visit (HOSPITAL_COMMUNITY): Payer: Medicare Other

## 2020-03-27 DIAGNOSIS — R1032 Left lower quadrant pain: Secondary | ICD-10-CM

## 2020-03-28 ENCOUNTER — Other Ambulatory Visit: Payer: Self-pay

## 2020-03-28 ENCOUNTER — Ambulatory Visit (HOSPITAL_COMMUNITY)
Admission: RE | Admit: 2020-03-28 | Discharge: 2020-03-28 | Disposition: A | Discharge status: Home / Self Care | Payer: Medicare Other | Source: Ambulatory Visit | Attending: Family Medicine | Admitting: Family Medicine

## 2020-03-28 DIAGNOSIS — R1032 Left lower quadrant pain: Secondary | ICD-10-CM | POA: Insufficient documentation

## 2020-03-28 LAB — POCT I-STAT CREATININE: Creatinine, Ser: 0.6 mg/dL (ref 0.44–1.00)

## 2020-03-28 MED ORDER — IOHEXOL 300 MG/ML  SOLN
100.0000 mL | Freq: Once | INTRAMUSCULAR | Status: AC | PRN
  Administered 2020-03-28: 100 mL via INTRAVENOUS

## 2020-06-21 ENCOUNTER — Encounter: Payer: Self-pay | Admitting: Cardiology

## 2020-06-21 ENCOUNTER — Ambulatory Visit: Payer: Medicare Other | Admitting: Cardiology

## 2020-06-21 ENCOUNTER — Other Ambulatory Visit: Payer: Self-pay

## 2020-06-21 DIAGNOSIS — K2289 Other specified disease of esophagus: Secondary | ICD-10-CM

## 2020-06-21 DIAGNOSIS — E785 Hyperlipidemia, unspecified: Secondary | ICD-10-CM

## 2020-06-21 DIAGNOSIS — I480 Paroxysmal atrial fibrillation: Secondary | ICD-10-CM

## 2020-06-21 DIAGNOSIS — I10 Essential (primary) hypertension: Secondary | ICD-10-CM | POA: Diagnosis not present

## 2020-06-21 DIAGNOSIS — J449 Chronic obstructive pulmonary disease, unspecified: Secondary | ICD-10-CM

## 2020-06-21 DIAGNOSIS — Z9861 Coronary angioplasty status: Secondary | ICD-10-CM

## 2020-06-21 DIAGNOSIS — I2510 Atherosclerotic heart disease of native coronary artery without angina pectoris: Secondary | ICD-10-CM | POA: Diagnosis not present

## 2020-06-21 NOTE — Assessment & Plan Note (Signed)
One episode in 2015 in the setting of CAP and sepsis- long term anticoagulation not recommended.

## 2020-06-21 NOTE — Assessment & Plan Note (Signed)
She resumed smoking

## 2020-06-21 NOTE — Assessment & Plan Note (Signed)
OM PCI '98, RCA PCI with DES 2000. Myoview low risk 2015

## 2020-06-21 NOTE — Assessment & Plan Note (Signed)
LDL 70 April 2021 on Crestor

## 2020-06-21 NOTE — Progress Notes (Signed)
Cardiology Office Note:    Date:  06/21/2020   ID:  [name], [name] 1946/02/27, MRN [id number]  PCP:  Ileana Ladd, MD  Cardiologist:  No primary care provider on file.  Electrophysiologist:  None   Referring MD: Ileana Ladd, MD   No chief complaint on file.   History of Present Illness:    [name] is a 74 y.o. female with a hx of coronary disease, status post OM1 PCI in the 1990s.  In 2000 she had an RCA PCI.  Catheterization in 2010 look good according to Dr. Swaziland.  Her last functional study was a Myoview in 2015 which was low risk.  Echocardiogram in 2015 showed an ejection fraction of 60 to 65% with moderate MR.  She had sepsis in 2015 and had PAF, it was not felt long term anticoagulation was needed.    She had several surgeries starting in 2017.  She had cervical discitis and spinal stenosis.  She dislocated her hip in 2016.  At that time she was found to have trouble swallowing.  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  This required surgical exploration.  During this time she lost significant amount of weight.  She ultimately underwent hip revision in February 2017.  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  She had a gastrotomy tube placed.  She was admitted again in January 2019 with a recurrent abscess.  She underwent thorascopic drainage of a posterior mediastinal mass and wedge resection.  She had further surgery at [facility] in March 2019.  And October 2019.  Her last surgery was September 2020 when she was had a right hip replacement after a fall.  Since then she is done well from a cardiac standpoint.  She denies any angina, typically for her it shoulder pain.  Unfortunately she has resumed smoking.  Past Medical History:  Diagnosis Date  . Asthma   . Breast cancer (HCC)    right breast   . COPD (chronic obstructive pulmonary disease) (HCC)   . Coronary artery disease   . Diverticulum of esophagus   . Elevated LFTs   . Emphysema lung (HCC)   . ETOH abuse   . H/O atrial fibrillation without current medication    only one time when she had sepsis  . Hyperlipidemia   . Hypertension    hx of but not on any medications  . Myocardial infarction (HCC) 2000  . OA (osteoarthritis) of knee   . Osteoarthritis   . Tobacco abuse     Past Surgical History:  Procedure Laterality Date  . ANTERIOR HIP REVISION Right 10/22/2015   Procedure: RIGHT  HIP REVISION;  Surgeon: Durene Romans, MD;  Location: [facility];  Service: Orthopedics;  Laterality: Right;  . APPLICATION OF WOUND VAC N/A 06/20/2015   Procedure: APPLICATION OF INCISIONAL WOUND VAC;  Surgeon: Venita Lick, MD;  Location: [facility];  Service: Orthopedics;  Laterality: N/A;  . BREAST SURGERY  1991   right mastectomy  . CARDIAC CATHETERIZATION  04/05/2009   EF 60%  . CARDIOVASCULAR STRESS TEST  01/31/2005   EF 58%  . CESAREAN SECTION  '78, '80, '81   x 3  . CORONARY ANGIOPLASTY  08/1998   x2 OF A BIFURCATION OM-1, OM-2 LESION  . CORONARY ANGIOPLASTY WITH STENT PLACEMENT  01/1999  MID FIRST OBTUSE MARGINAL VESSEL  . CORONARY ANGIOPLASTY WITH STENT PLACEMENT  07/1999   STENTING AT THE CRUX OF THE RIGHT CORONARY ARTERY WITH A 3.8MM X TETRA STENT  . DIRECT LARYNGOSCOPY N/A 05/03/2015   Procedure: DIRECT LARYNGOSCOPY;  Surgeon: Flo Shanks, MD;  Location: [facility] OR;  Service: ENT;  Laterality: N/A;  . EYE SURGERY  05/18/2014,06/01/2014   BILATERAL CATARACT S WITH LENS IMPLANTS  . GASTROSTOMY N/A 05/04/2015   Procedure: OPEN GASTROSTOMY WITH TUBE PLACEMENT;  Surgeon: Manus Rudd, MD;  Location: [facility];  Service: General;  Laterality: N/A;  . GASTROSTOMY N/A 11/13/2016   Procedure: INSERTION OF GASTROSTOMY TUBE;  Surgeon: Abigail Miyamoto, MD;  Location: [facility] OR;  Service: General;  Laterality: N/A;  . HARDWARE REMOVAL N/A 05/03/2015    Procedure: HARDWARE REMOVAL;  Surgeon: Venita Lick, MD;  Location: [facility];  Service: Orthopedics;  Laterality: N/A;  . HIP CLOSED REDUCTION Right 04/26/2015   Procedure: CLOSED REDUCTION HIP;  Surgeon: Venita Lick, MD;  Location: [facility];  Service: Orthopedics;  Laterality: Right;  . HYSTEROSCOPY     D & C  . INCISION AND DRAINAGE ABSCESS N/A 05/03/2015   Procedure: INCISION AND DRAINAGE CERVICAL  ABSCESS AND REMOVAL OF HARDWARE;  Surgeon: Venita Lick, MD;  Location: [facility];  Service: Orthopedics;  Laterality: N/A;  . IR CM INJ ANY COLONIC TUBE W/FLUORO  10/14/2017  . IR CM INJ ANY COLONIC TUBE W/FLUORO  10/23/2017  . IR CM INJ ANY COLONIC TUBE W/FLUORO  10/28/2017  . IR REPLC GASTRO/COLONIC TUBE PERCUT W/FLUORO  09/22/2017  . JOINT REPLACEMENT  08/2011   bilateral hip  . JOINT REPLACEMENT  01/2012   right hip  . MASTECTOMY    . neck fusion  2011  . ORIF FEMUR FRACTURE Right 06/14/2019   Procedure: ORIF PERI PROSTHETIC FEMUR FRACTURE;  Surgeon: Durene Romans, MD;  Location: [facility] OR;  Service: Orthopedics;  Laterality: Right;  . PELVIC LAPAROSCOPY  2002   RSO-    . RADICAL NECK DISSECTION N/A 11/08/2016   Procedure: INCISION AND DRAINAGE OF NECK ABSCESS;  Surgeon: Osborn Coho, MD;  Location: [facility] OR;  Service: ENT;  Laterality: N/A;  . RADIOLOGY WITH ANESTHESIA Right 06/28/2015   Procedure: MRI OF CERVICAL SPINE  AND RIGHT HIP  WITH AND WITHOUT CONTRAST    (RADIOLOGY WITH ANESTHESIA);  Surgeon: Medication Radiologist, MD;  Location: [facility];  Service: Radiology;  Laterality: Right;  . REMOVAL OF GASTROSTOMY TUBE N/A 11/14/2016   Procedure: REMOVAL OF GASTROSTOMY TUBE W/ REPLACEMENT OF GASTROSTOMY TUBE;  Surgeon: Abigail Miyamoto, MD;  Location: [facility];  Service: General;  Laterality: N/A;  . RIGID ESOPHAGOSCOPY N/A 05/03/2015   Procedure: RIGID ESOPHAGOSCOPY;  Surgeon: Flo Shanks, MD;  Location: [facility] OR;  Service: ENT;  Laterality: N/A;  . TONSILLECTOMY AND ADENOIDECTOMY    . TOTAL HIP  ARTHROPLASTY  08/2010   bilat  . VULVECTOMY  1981   partial    Current Medications: Current Meds  Medication Sig  . albuterol (PROVENTIL HFA;VENTOLIN HFA) 108 (90 BASE) MCG/ACT inhaler Inhale 2 puffs into the lungs every 6 (six) hours as needed for wheezing.   Marland Kitchen ALPRAZolam (XANAX) 0.25 MG tablet Take one tablet daily or as needed  . aspirin EC 81 MG EC tablet Take 1 tablet (81 mg total) by mouth 2 (two) times daily.  . budesonide-formoterol (SYMBICORT) 160-4.5 MCG/ACT inhaler Inhale 2 puffs into the lungs 2 (two) times daily as needed (Wheezing). Reported on 10/16/2015  .  diclofenac (VOLTAREN) 75 MG EC tablet Take 75 mg by mouth daily as needed for pain.  Marland Kitchen diclofenac sodium (VOLTAREN) 1 % GEL Apply 1 application topically 4 (four) times daily as needed for pain.  . furosemide (LASIX) 20 MG tablet Take 1 tablet (20 mg total) by mouth daily as needed for edema.  . gabapentin (NEURONTIN) 600 MG tablet Take 600 mg by mouth 4 (four) times daily.   Marland Kitchen losartan (COZAAR) 50 MG tablet Take 1 tablet (50 mg total) by mouth daily.  . methocarbamol (ROBAXIN) 500 MG tablet Take 500 mg by mouth daily as needed.  Marland Kitchen morphine (MS CONTIN) 30 MG 12 hr tablet Take 30 mg by mouth 4 (four) times daily.   . nicotine (NICODERM CQ - DOSED IN MG/24 HOURS) 14 mg/24hr patch Place 1 patch (14 mg total) onto the skin daily.  Marland Kitchen oxyCODONE (ROXICODONE) 15 MG immediate release tablet Take 15 mg by mouth 4 (four) times daily.  . rosuvastatin (CRESTOR) 10 MG tablet Take 1 tablet (10 mg total) by mouth daily.     Allergies:   Cephalexin, Chlorhexidine, Prednisone, and Zithromax [azithromycin dihydrate]   Social History   Socioeconomic History  . Marital status: Married    Spouse name: Not on file  . Number of children: Not on file  . Years of education: Not on file  . Highest education level: Not on file  Occupational History  . Occupation: Airline pilot  Tobacco Use  . Smoking status: Former Smoker    Packs/day: 1.00     Years: 50.00    Pack years: 50.00    Types: Cigarettes  . Smokeless tobacco: Never Used  . Tobacco comment: using chantix  Vaping Use  . Vaping Use: Never used  Substance and Sexual Activity  . Alcohol use: No    Alcohol/week: 0.0 standard drinks    Comment: former alcohol abuse  . Drug use: No  . Sexual activity: Not on file  Other Topics Concern  . Not on file  Social History Narrative  . Not on file   Social Determinants of Health   Financial Resource Strain:   . Difficulty of Paying Living Expenses: Not on file  Food Insecurity:   . Worried About Programme researcher, broadcasting/film/video in the Last Year: Not on file  . Ran Out of Food in the Last Year: Not on file  Transportation Needs:   . Lack of Transportation (Medical): Not on file  . Lack of Transportation (Non-Medical): Not on file  Physical Activity:   . Days of Exercise per Week: Not on file  . Minutes of Exercise per Session: Not on file  Stress:   . Feeling of Stress : Not on file  Social Connections:   . Frequency of Communication with Friends and Family: Not on file  . Frequency of Social Gatherings with Friends and Family: Not on file  . Attends Religious Services: Not on file  . Active Member of Clubs or Organizations: Not on file  . Attends Banker Meetings: Not on file  . Marital Status: Not on file     Family History: The patient's family history includes Diabetes in her mother; Heart attack in her father; Heart disease in her father; Hypertension in her father; Stroke in her father.  ROS:   Please see the history of present illness.     All other systems reviewed and are negative.  EKGs/Labs/Other Studies Reviewed:    The following studies were reviewed today: Myoview Nov  2015- Normal myoview study. Normal EF.  Echo Dec 2015- Study Conclusions   - Left ventricle: The cavity size was normal. Wall thickness was  increased in a pattern of mild LVH. Systolic function was normal.  The  estimated ejection fraction was in the range of 60% to 65%.  - Mitral valve: Poorly visualized in apical views. MAC wtih  restricted posterior leaflet motion and likely moderate MR.  - Left atrium: The atrium was mildly dilated.  - Pulmonary arteries: PA peak pressure: 39 mm Hg (S).  - Pericardium, extracardiac: Likely large epicardial fat pad and  small apical effusion Consider CT to further assess pericardial  space.  - Impressions: Overall image quality is poor with non diagnostic  apical views.     EKG:  EKG is ordered today.  The ekg ordered today demonstrates NSR, HR 63, inferior lateral TWI- more prominent than her EKG 10/23/2018 but similar to her EKG 09/20/2018.  Recent Labs: 12/20/2019: ALT 15; BUN 16; Potassium 5.0; Sodium 138 03/28/2020: Creatinine, Ser 0.60  Recent Lipid Panel    Component Value Date/Time   CHOL 135 12/20/2019 0839   TRIG 66 12/20/2019 0839   HDL 51 12/20/2019 0839   CHOLHDL 2.6 12/20/2019 0839   CHOLHDL 3 05/30/2011 0811   VLDL 17.2 05/30/2011 0811   LDLCALC 70 12/20/2019 0839    Physical Exam:    VS:  BP 132/78   Pulse 63   Ht 5\' 2"  (1.575 m)   Wt 164 lb 12.8 oz (74.8 kg)   SpO2 97%   BMI 30.14 kg/m     Wt Readings from Last 3 Encounters:  06/21/20 164 lb 12.8 oz (74.8 kg)  12/27/19 148 lb 12.8 oz (67.5 kg)  10/24/19 163 lb 6.4 oz (74.1 kg)     GEN:  Well nourished, well developed in no acute distress HEENT: Normal NECK: No JVD; No carotid bruits CARDIAC: RRR, no murmurs, rubs, gallops RESPIRATORY:  Clear to auscultation without rales, wheezing or rhonchi  ABDOMEN: Soft, non-tender, non-distended, surgical scar MUSCULOSKELETAL:  No edema; No deformity  SKIN: Warm and dry NEUROLOGIC:  Alert and oriented x 3 PSYCHIATRIC:  Normal affect   ASSESSMENT:    CAD S/P percutaneous coronary angioplasty OM PCI '98, RCA PCI with DES 2000. Myoview low risk 2015  COPD (chronic obstructive pulmonary disease) (HCC) She resumed  smoking  Essential hypertension Controlled  PAF (paroxysmal atrial fibrillation) (HCC) One episode in 2015 in the setting of CAP and sepsis- long term anticoagulation not recommended.  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  I strongly encouraged her to resume her efforts to stop smoking which has been difficult for her sinse she lives with smokers at home. F/U Dr Swaziland in a year.    Medication Adjustments/Labs and Tests Ordered: Current medicines are reviewed at length with the patient today.  Concerns regarding medicines are outlined above.  No orders of the defined types were placed in this encounter.  No orders of the defined types were placed in this encounter.   Patient Instructions  Medication Instructions:  Continue current medications  *If you need a refill on your cardiac medications before your next appointment, please call your pharmacy*   Lab Work: None Ordered   Testing/Procedures: None Ordered   Follow-Up: At [facility], you and your health needs are our priority.  As part of our continuing mission to provide you  with exceptional heart care, we have created designated Provider Care Teams.  These Care Teams include your primary Cardiologist (physician) and Advanced Practice Providers (APPs -  Physician Assistants and Nurse Practitioners) who all work together to provide you with the care you need, when you need it.  We recommend signing up for the patient portal called "MyChart".  Sign up information is provided on this After Visit Summary.  MyChart is used to connect with patients for Virtual Visits (Telemedicine).  Patients are able to view lab/test results, encounter notes, upcoming appointments, etc.  Non-urgent messages can be sent to your provider as well.   To learn more about what you can do with MyChart, go to [email or web address].    Your next  appointment:   1 year(s)  The format for your next appointment:   In Person  Provider:   You may see Peter, [place], MD or one of the following Advanced Practice Providers on your designated Care Team:    Azalee Course, PA-C  Micah Flesher, PA-C or   Judy Pimple, PA-C         Signed, Corine Shelter, [place]  06/21/2020 10:13 AM    [facility]

## 2020-06-21 NOTE — Patient Instructions (Signed)
Medication Instructions:  Continue current medications  *If you need a refill on your cardiac medications before your next appointment, please call your pharmacy*   Lab Work: None Ordered   Testing/Procedures: None Ordered   Follow-Up: At [facility], you and your health needs are our priority.  As part of our continuing mission to provide you with exceptional heart care, we have created designated Provider Care Teams.  These Care Teams include your primary Cardiologist (physician) and Advanced Practice Providers (APPs -  Physician Assistants and Nurse Practitioners) who all work together to provide you with the care you need, when you need it.  We recommend signing up for the patient portal called "MyChart".  Sign up information is provided on this After Visit Summary.  MyChart is used to connect with patients for Virtual Visits (Telemedicine).  Patients are able to view lab/test results, encounter notes, upcoming appointments, etc.  Non-urgent messages can be sent to your provider as well.   To learn more about what you can do with MyChart, go to [email or web address].    Your next appointment:   1 year(s)  The format for your next appointment:   In Person  Provider:   You may see Peter, [place], MD or one of the following Advanced Practice Providers on your designated Care Team:    Almyra Deforest, PA-C  Fabian Sharp, PA-C or   Roby Lofts, [place]

## 2020-06-21 NOTE — Assessment & Plan Note (Signed)
Multiple surgeries- 2016-2019

## 2020-06-21 NOTE — Assessment & Plan Note (Signed)
Controlled.  

## 2020-07-02 ENCOUNTER — Ambulatory Visit: Payer: Medicare Other | Admitting: Cardiology

## 2020-08-28 ENCOUNTER — Other Ambulatory Visit: Payer: Self-pay | Admitting: Family Medicine

## 2020-08-28 DIAGNOSIS — Z1231 Encounter for screening mammogram for malignant neoplasm of breast: Secondary | ICD-10-CM

## 2020-08-28 DIAGNOSIS — E2839 Other primary ovarian failure: Secondary | ICD-10-CM

## 2020-09-16 DIAGNOSIS — R059 Cough, unspecified: Secondary | ICD-10-CM | POA: Diagnosis not present

## 2020-09-16 DIAGNOSIS — R062 Wheezing: Secondary | ICD-10-CM | POA: Diagnosis not present

## 2020-09-16 DIAGNOSIS — U071 COVID-19: Secondary | ICD-10-CM | POA: Diagnosis not present

## 2020-09-16 DIAGNOSIS — Z8709 Personal history of other diseases of the respiratory system: Secondary | ICD-10-CM | POA: Diagnosis not present

## 2020-09-16 DIAGNOSIS — J40 Bronchitis, not specified as acute or chronic: Secondary | ICD-10-CM | POA: Diagnosis not present

## 2020-09-27 DIAGNOSIS — R062 Wheezing: Secondary | ICD-10-CM | POA: Diagnosis not present

## 2020-09-27 DIAGNOSIS — Z8709 Personal history of other diseases of the respiratory system: Secondary | ICD-10-CM | POA: Diagnosis not present

## 2020-09-27 DIAGNOSIS — N390 Urinary tract infection, site not specified: Secondary | ICD-10-CM | POA: Diagnosis not present

## 2020-09-27 DIAGNOSIS — J40 Bronchitis, not specified as acute or chronic: Secondary | ICD-10-CM | POA: Diagnosis not present

## 2020-09-27 DIAGNOSIS — U071 COVID-19: Secondary | ICD-10-CM | POA: Diagnosis not present

## 2020-11-27 DIAGNOSIS — M5459 Other low back pain: Secondary | ICD-10-CM | POA: Diagnosis not present

## 2020-11-27 DIAGNOSIS — Z79899 Other long term (current) drug therapy: Secondary | ICD-10-CM | POA: Diagnosis not present

## 2020-11-27 DIAGNOSIS — G894 Chronic pain syndrome: Secondary | ICD-10-CM | POA: Diagnosis not present

## 2020-12-04 ENCOUNTER — Other Ambulatory Visit: Payer: Self-pay

## 2020-12-04 ENCOUNTER — Ambulatory Visit
Admission: RE | Admit: 2020-12-04 | Discharge: 2020-12-04 | Disposition: A | Discharge status: Home / Self Care | Payer: Medicare Other | Source: Ambulatory Visit | Attending: Family Medicine | Admitting: Family Medicine

## 2020-12-04 DIAGNOSIS — E2839 Other primary ovarian failure: Secondary | ICD-10-CM

## 2020-12-04 DIAGNOSIS — Z1231 Encounter for screening mammogram for malignant neoplasm of breast: Secondary | ICD-10-CM | POA: Diagnosis not present

## 2020-12-04 DIAGNOSIS — Z780 Asymptomatic menopausal state: Secondary | ICD-10-CM | POA: Diagnosis not present

## 2021-01-07 ENCOUNTER — Other Ambulatory Visit: Payer: Self-pay | Admitting: Cardiology

## 2021-02-25 DIAGNOSIS — I700 Atherosclerosis of aorta: Secondary | ICD-10-CM | POA: Diagnosis not present

## 2021-02-25 DIAGNOSIS — M4155 Other secondary scoliosis, thoracolumbar region: Secondary | ICD-10-CM | POA: Diagnosis not present

## 2021-02-25 DIAGNOSIS — E1169 Type 2 diabetes mellitus with other specified complication: Secondary | ICD-10-CM | POA: Diagnosis not present

## 2021-02-25 DIAGNOSIS — F17200 Nicotine dependence, unspecified, uncomplicated: Secondary | ICD-10-CM | POA: Diagnosis not present

## 2021-02-25 DIAGNOSIS — I10 Essential (primary) hypertension: Secondary | ICD-10-CM | POA: Diagnosis not present

## 2021-02-25 DIAGNOSIS — M4015 Other secondary kyphosis, thoracolumbar region: Secondary | ICD-10-CM | POA: Diagnosis not present

## 2021-03-25 DIAGNOSIS — M545 Low back pain, unspecified: Secondary | ICD-10-CM | POA: Diagnosis not present

## 2021-03-29 DIAGNOSIS — G894 Chronic pain syndrome: Secondary | ICD-10-CM | POA: Diagnosis not present

## 2021-03-29 DIAGNOSIS — Z5181 Encounter for therapeutic drug level monitoring: Secondary | ICD-10-CM | POA: Diagnosis not present

## 2021-03-29 DIAGNOSIS — M545 Low back pain, unspecified: Secondary | ICD-10-CM | POA: Diagnosis not present

## 2021-03-29 DIAGNOSIS — Z79899 Other long term (current) drug therapy: Secondary | ICD-10-CM | POA: Diagnosis not present

## 2021-03-29 DIAGNOSIS — Z79891 Long term (current) use of opiate analgesic: Secondary | ICD-10-CM | POA: Diagnosis not present

## 2021-03-29 DIAGNOSIS — M5412 Radiculopathy, cervical region: Secondary | ICD-10-CM | POA: Diagnosis not present

## 2021-04-09 ENCOUNTER — Other Ambulatory Visit: Payer: Self-pay

## 2021-04-09 ENCOUNTER — Ambulatory Visit (HOSPITAL_BASED_OUTPATIENT_CLINIC_OR_DEPARTMENT_OTHER): Payer: Medicare Other | Attending: Orthopedic Surgery | Admitting: Physical Therapy

## 2021-04-09 ENCOUNTER — Encounter (HOSPITAL_BASED_OUTPATIENT_CLINIC_OR_DEPARTMENT_OTHER): Payer: Self-pay | Admitting: Physical Therapy

## 2021-04-09 DIAGNOSIS — G8929 Other chronic pain: Secondary | ICD-10-CM | POA: Diagnosis not present

## 2021-04-09 DIAGNOSIS — R2689 Other abnormalities of gait and mobility: Secondary | ICD-10-CM | POA: Diagnosis not present

## 2021-04-09 DIAGNOSIS — R293 Abnormal posture: Secondary | ICD-10-CM | POA: Diagnosis not present

## 2021-04-09 DIAGNOSIS — M545 Low back pain, unspecified: Secondary | ICD-10-CM | POA: Diagnosis not present

## 2021-04-09 NOTE — Patient Instructions (Signed)
Access Code: [email or web address] URL: [email or web address] Date: 04/09/2021 Prepared by: Carolyne Littles  Exercises Seated Hip Abduction with Resistance - 1 x daily - 7 x weekly - 3 sets - 10 reps Seated March - 1 x daily - 7 x weekly - 3 sets - 10 reps Shoulder External Rotation and Scapular Retraction with Resistance - 1 x daily - 7 x weekly - 3 sets - 10 reps

## 2021-04-10 NOTE — Therapy (Signed)
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[place]) 04/26/2015   PAF (paroxysmal atrial fibrillation) ([place]) 09/20/2014   Elevated troponin 09/20/2014   Acute renal failure syndrome (HCC)    Malnutrition of moderate degree ([employer]) 08/29/2014   Pneumonia    Respiratory failure ([place])    UTI (urinary tract infection) 08/27/2014   Sepsis, unspecified organism ([place]) 08/27/2014   Shock circulatory ([place]) 08/27/2014   Acute kidney injury ([place]) 08/27/2014   Encephalopathy acute 08/27/2014   Hypokalemia 08/26/2014   Hyponatremia 08/26/2014   Acute renal failure ([place]) 08/26/2014   Weakness 08/26/2014   ETOH abuse 08/07/2014   Edema extremities 07/12/2014   Atelectasis 08/16/2011   Diabetes mellitus type 2, insulin dependent (HCC)    COPD (chronic obstructive pulmonary disease) ([place])    Tobacco dependence    Dyslipidemia, goal LDL below 70    Essential hypertension    CAD S/P percutaneous coronary angioplasty     Carney Living PT DPT  04/10/2021, 8:25 AM  Davis Gourd  04/10/2021   During this treatment session, the therapist was present, participating in and directing the treatment.   [facility] [address] [place], [place], [address] Phone: [phone]   Fax:  [phone]  Name: ROSEZETTA SAMMUT MRN: [id number] Date of Birth: [birth year]  [facility] [address] [place], [place], [address] Phone: [phone]   Fax:  [phone]  Physical Therapy Evaluation  Patient Details  Name: [name] MRN: [id number] Date of Birth: 06-04-1946 Referring Provider (PT): Dr Melina Schools   Encounter Date: 04/09/2021   PT End of Session - 04/09/21 1109     Visit Number 1    Number of Visits 6    Date for PT Re-Evaluation 05/21/21    Authorization Type UHC Medicare 10 th visit progress note    PT Start Time 0845    PT Stop Time 0928    PT Time Calculation (min) 43 min    Activity Tolerance Patient tolerated treatment well    Behavior During Therapy [facility] for tasks assessed/performed             Past Medical History:  Diagnosis Date   Asthma    Breast cancer ([place])    right breast   COPD (chronic obstructive pulmonary disease) ([place])    Coronary artery disease    Diverticulum of esophagus    Elevated LFTs    Emphysema lung ([place])    ETOH abuse    H/O atrial fibrillation without current medication    only one time when she had sepsis   Hyperlipidemia    Hypertension    hx of but not on any medications   Myocardial infarction ([place]) 2000   OA (osteoarthritis) of knee    Osteoarthritis    Tobacco abuse     Past Surgical History:  Procedure Laterality Date   ANTERIOR HIP REVISION Right 10/22/2015   Procedure: RIGHT  HIP REVISION;  Surgeon: Paralee Cancel, MD;  [facility];  Service: Orthopedics;  Laterality: Right;   APPLICATION OF WOUND VAC N/A 06/20/2015   Procedure: APPLICATION OF INCISIONAL WOUND VAC;  Surgeon: Melina Schools, MD;  Location: [facility];  Service: Orthopedics;  Laterality: N/A;   BREAST SURGERY  1991   right mastectomy   CARDIAC CATHETERIZATION  04/05/2009   EF 60%   CARDIOVASCULAR STRESS TEST  01/31/2005   EF 58%   CESAREAN SECTION  '78, '80, '81   x 3   CORONARY ANGIOPLASTY  08/1998   x2 OF A BIFURCATION OM-1, OM-2 LESION   CORONARY  ANGIOPLASTY WITH STENT PLACEMENT  01/1999   MID FIRST OBTUSE MARGINAL VESSEL   CORONARY ANGIOPLASTY WITH STENT PLACEMENT  07/1999   STENTING AT THE CRUX OF THE RIGHT CORONARY ARTERY WITH A 3.8MM X 18MM TETRA STENT   DIRECT LARYNGOSCOPY N/A 05/03/2015   Procedure: DIRECT LARYNGOSCOPY;  Surgeon: Jodi Marble, MD;  Location: [facility];  Service: ENT;  Laterality: N/A;   EYE SURGERY  05/18/2014,06/01/2014   BILATERAL CATARACT S WITH LENS IMPLANTS   GASTROSTOMY N/A 05/04/2015   Procedure: OPEN GASTROSTOMY WITH TUBE PLACEMENT;  Surgeon: Donnie Mesa, MD;  Location: [facility];  Service: General;  Laterality: N/A;   GASTROSTOMY N/A 11/13/2016   Procedure: INSERTION OF GASTROSTOMY TUBE;  Surgeon: Coralie Keens, MD;  Location: [facility];  Service: General;  Laterality: N/A;   HARDWARE REMOVAL N/A 05/03/2015   Procedure: HARDWARE REMOVAL;  Surgeon: Melina Schools, MD;  Location: [facility];  Service: Orthopedics;  Laterality: N/A;   HIP CLOSED REDUCTION Right 04/26/2015   Procedure: CLOSED REDUCTION HIP;  Surgeon: Melina Schools, MD;  [facility];  Service: Orthopedics;  Laterality: Right;   HYSTEROSCOPY     D & C   INCISION AND DRAINAGE ABSCESS N/A 05/03/2015  [facility] [address] [place], [place], [address] Phone: [phone]   Fax:  [phone]  Physical Therapy Evaluation  Patient Details  Name: [name] MRN: [id number] Date of Birth: 06-04-1946 Referring Provider (PT): Dr Melina Schools   Encounter Date: 04/09/2021   PT End of Session - 04/09/21 1109     Visit Number 1    Number of Visits 6    Date for PT Re-Evaluation 05/21/21    Authorization Type UHC Medicare 10 th visit progress note    PT Start Time 0845    PT Stop Time 0928    PT Time Calculation (min) 43 min    Activity Tolerance Patient tolerated treatment well    Behavior During Therapy [facility] for tasks assessed/performed             Past Medical History:  Diagnosis Date   Asthma    Breast cancer ([place])    right breast   COPD (chronic obstructive pulmonary disease) ([place])    Coronary artery disease    Diverticulum of esophagus    Elevated LFTs    Emphysema lung ([place])    ETOH abuse    H/O atrial fibrillation without current medication    only one time when she had sepsis   Hyperlipidemia    Hypertension    hx of but not on any medications   Myocardial infarction ([place]) 2000   OA (osteoarthritis) of knee    Osteoarthritis    Tobacco abuse     Past Surgical History:  Procedure Laterality Date   ANTERIOR HIP REVISION Right 10/22/2015   Procedure: RIGHT  HIP REVISION;  Surgeon: Paralee Cancel, MD;  [facility];  Service: Orthopedics;  Laterality: Right;   APPLICATION OF WOUND VAC N/A 06/20/2015   Procedure: APPLICATION OF INCISIONAL WOUND VAC;  Surgeon: Melina Schools, MD;  Location: [facility];  Service: Orthopedics;  Laterality: N/A;   BREAST SURGERY  1991   right mastectomy   CARDIAC CATHETERIZATION  04/05/2009   EF 60%   CARDIOVASCULAR STRESS TEST  01/31/2005   EF 58%   CESAREAN SECTION  '78, '80, '81   x 3   CORONARY ANGIOPLASTY  08/1998   x2 OF A BIFURCATION OM-1, OM-2 LESION   CORONARY  ANGIOPLASTY WITH STENT PLACEMENT  01/1999   MID FIRST OBTUSE MARGINAL VESSEL   CORONARY ANGIOPLASTY WITH STENT PLACEMENT  07/1999   STENTING AT THE CRUX OF THE RIGHT CORONARY ARTERY WITH A 3.8MM X 18MM TETRA STENT   DIRECT LARYNGOSCOPY N/A 05/03/2015   Procedure: DIRECT LARYNGOSCOPY;  Surgeon: Jodi Marble, MD;  Location: [facility];  Service: ENT;  Laterality: N/A;   EYE SURGERY  05/18/2014,06/01/2014   BILATERAL CATARACT S WITH LENS IMPLANTS   GASTROSTOMY N/A 05/04/2015   Procedure: OPEN GASTROSTOMY WITH TUBE PLACEMENT;  Surgeon: Donnie Mesa, MD;  Location: [facility];  Service: General;  Laterality: N/A;   GASTROSTOMY N/A 11/13/2016   Procedure: INSERTION OF GASTROSTOMY TUBE;  Surgeon: Coralie Keens, MD;  Location: [facility];  Service: General;  Laterality: N/A;   HARDWARE REMOVAL N/A 05/03/2015   Procedure: HARDWARE REMOVAL;  Surgeon: Melina Schools, MD;  Location: [place];  Service: Orthopedics;  Laterality: N/A;   HIP CLOSED REDUCTION Right 04/26/2015   Procedure: CLOSED REDUCTION HIP;  Surgeon: Melina Schools, MD;  [facility];  Service: Orthopedics;  Laterality: Right;   HYSTEROSCOPY     D & C   INCISION AND DRAINAGE ABSCESS N/A 05/03/2015  [facility] [address] [place], [place], [address] Phone: [phone]   Fax:  [phone]  Physical Therapy Evaluation  Patient Details  Name: [name] MRN: [id number] Date of Birth: 06-04-1946 Referring Provider (PT): Dr Melina Schools   Encounter Date: 04/09/2021   PT End of Session - 04/09/21 1109     Visit Number 1    Number of Visits 6    Date for PT Re-Evaluation 05/21/21    Authorization Type UHC Medicare 10 th visit progress note    PT Start Time 0845    PT Stop Time 0928    PT Time Calculation (min) 43 min    Activity Tolerance Patient tolerated treatment well    Behavior During Therapy [facility] for tasks assessed/performed             Past Medical History:  Diagnosis Date   Asthma    Breast cancer ([place])    right breast   COPD (chronic obstructive pulmonary disease) ([place])    Coronary artery disease    Diverticulum of esophagus    Elevated LFTs    Emphysema lung ([place])    ETOH abuse    H/O atrial fibrillation without current medication    only one time when she had sepsis   Hyperlipidemia    Hypertension    hx of but not on any medications   Myocardial infarction ([place]) 2000   OA (osteoarthritis) of knee    Osteoarthritis    Tobacco abuse     Past Surgical History:  Procedure Laterality Date   ANTERIOR HIP REVISION Right 10/22/2015   Procedure: RIGHT  HIP REVISION;  Surgeon: Paralee Cancel, MD;  [facility];  Service: Orthopedics;  Laterality: Right;   APPLICATION OF WOUND VAC N/A 06/20/2015   Procedure: APPLICATION OF INCISIONAL WOUND VAC;  Surgeon: Melina Schools, MD;  Location: [facility];  Service: Orthopedics;  Laterality: N/A;   BREAST SURGERY  1991   right mastectomy   CARDIAC CATHETERIZATION  04/05/2009   EF 60%   CARDIOVASCULAR STRESS TEST  01/31/2005   EF 58%   CESAREAN SECTION  '78, '80, '81   x 3   CORONARY ANGIOPLASTY  08/1998   x2 OF A BIFURCATION OM-1, OM-2 LESION   CORONARY  ANGIOPLASTY WITH STENT PLACEMENT  01/1999   MID FIRST OBTUSE MARGINAL VESSEL   CORONARY ANGIOPLASTY WITH STENT PLACEMENT  07/1999   STENTING AT THE CRUX OF THE RIGHT CORONARY ARTERY WITH A 3.8MM X 18MM TETRA STENT   DIRECT LARYNGOSCOPY N/A 05/03/2015   Procedure: DIRECT LARYNGOSCOPY;  Surgeon: Jodi Marble, MD;  Location: [place];  Service: ENT;  Laterality: N/A;   EYE SURGERY  05/18/2014,06/01/2014   BILATERAL CATARACT S WITH LENS IMPLANTS   GASTROSTOMY N/A 05/04/2015   Procedure: OPEN GASTROSTOMY WITH TUBE PLACEMENT;  Surgeon: Donnie Mesa, MD;  Location: [facility];  Service: General;  Laterality: N/A;   GASTROSTOMY N/A 11/13/2016   Procedure: INSERTION OF GASTROSTOMY TUBE;  Surgeon: Coralie Keens, MD;  Location: [facility] [place];  Service: General;  Laterality: N/A;   HARDWARE REMOVAL N/A 05/03/2015   Procedure: HARDWARE REMOVAL;  Surgeon: Melina Schools, MD;  Location: [place];  Service: Orthopedics;  Laterality: N/A;   HIP CLOSED REDUCTION Right 04/26/2015   Procedure: CLOSED REDUCTION HIP;  Surgeon: Melina Schools, MD;  [facility];  Service: Orthopedics;  Laterality: Right;   HYSTEROSCOPY     D & C   INCISION AND DRAINAGE ABSCESS N/A 05/03/2015  [facility] [address] [place], [place], [address] Phone: [phone]   Fax:  [phone]  Physical Therapy Evaluation  Patient Details  Name: [name] MRN: [id number] Date of Birth: 06-04-1946 Referring Provider (PT): Dr Melina Schools   Encounter Date: 04/09/2021   PT End of Session - 04/09/21 1109     Visit Number 1    Number of Visits 6    Date for PT Re-Evaluation 05/21/21    Authorization Type UHC Medicare 10 th visit progress note    PT Start Time 0845    PT Stop Time 0928    PT Time Calculation (min) 43 min    Activity Tolerance Patient tolerated treatment well    Behavior During Therapy [facility] for tasks assessed/performed             Past Medical History:  Diagnosis Date   Asthma    Breast cancer ([place])    right breast   COPD (chronic obstructive pulmonary disease) ([place])    Coronary artery disease    Diverticulum of esophagus    Elevated LFTs    Emphysema lung ([place])    ETOH abuse    H/O atrial fibrillation without current medication    only one time when she had sepsis   Hyperlipidemia    Hypertension    hx of but not on any medications   Myocardial infarction ([place]) 2000   OA (osteoarthritis) of knee    Osteoarthritis    Tobacco abuse     Past Surgical History:  Procedure Laterality Date   ANTERIOR HIP REVISION Right 10/22/2015   Procedure: RIGHT  HIP REVISION;  Surgeon: Paralee Cancel, MD;  [facility];  Service: Orthopedics;  Laterality: Right;   APPLICATION OF WOUND VAC N/A 06/20/2015   Procedure: APPLICATION OF INCISIONAL WOUND VAC;  Surgeon: Melina Schools, MD;  Location: [facility];  Service: Orthopedics;  Laterality: N/A;   BREAST SURGERY  1991   right mastectomy   CARDIAC CATHETERIZATION  04/05/2009   EF 60%   CARDIOVASCULAR STRESS TEST  01/31/2005   EF 58%   CESAREAN SECTION  '78, '80, '81   x 3   CORONARY ANGIOPLASTY  08/1998   x2 OF A BIFURCATION OM-1, OM-2 LESION   CORONARY  ANGIOPLASTY WITH STENT PLACEMENT  01/1999   MID FIRST OBTUSE MARGINAL VESSEL   CORONARY ANGIOPLASTY WITH STENT PLACEMENT  07/1999   STENTING AT THE CRUX OF THE RIGHT CORONARY ARTERY WITH A 3.8MM X 18MM TETRA STENT   DIRECT LARYNGOSCOPY N/A 05/03/2015   Procedure: DIRECT LARYNGOSCOPY;  Surgeon: Jodi Marble, MD;  Location: [place];  Service: ENT;  Laterality: N/A;   EYE SURGERY  05/18/2014,06/01/2014   BILATERAL CATARACT S WITH LENS IMPLANTS   GASTROSTOMY N/A 05/04/2015   Procedure: OPEN GASTROSTOMY WITH TUBE PLACEMENT;  Surgeon: Donnie Mesa, MD;  Location: [facility] [place];  Service: General;  Laterality: N/A;   GASTROSTOMY N/A 11/13/2016   Procedure: INSERTION OF GASTROSTOMY TUBE;  Surgeon: Coralie Keens, MD;  Location: [facility];  Service: General;  Laterality: N/A;   HARDWARE REMOVAL N/A 05/03/2015   Procedure: HARDWARE REMOVAL;  Surgeon: Melina Schools, MD;  Location: [place];  Service: Orthopedics;  Laterality: N/A;   HIP CLOSED REDUCTION Right 04/26/2015   Procedure: CLOSED REDUCTION HIP;  Surgeon: Melina Schools, MD;  [facility];  Service: Orthopedics;  Laterality: Right;   HYSTEROSCOPY     D & C   INCISION AND DRAINAGE ABSCESS N/A 05/03/2015

## 2021-04-10 NOTE — Therapy (Deleted)
[place]) 09/20/2014   Elevated troponin 09/20/2014   Acute renal failure syndrome (HCC)    Malnutrition of moderate degree ([place]) 08/29/2014   Pneumonia    Respiratory failure ([place])    UTI (urinary tract infection) 08/27/2014   Sepsis, unspecified organism ([place]) 08/27/2014   Shock circulatory ([place]) 08/27/2014   Acute kidney injury ([place]) 08/27/2014   Encephalopathy acute 08/27/2014   Hypokalemia 08/26/2014   Hyponatremia 08/26/2014   Acute renal failure ([place]) 08/26/2014   Weakness 08/26/2014   ETOH abuse 08/07/2014   Edema extremities 07/12/2014   Atelectasis 08/16/2011   Diabetes mellitus type 2, insulin dependent (HCC)    COPD (chronic obstructive pulmonary disease) ([place])    Tobacco dependence    Dyslipidemia, goal LDL below 70    Essential hypertension    CAD S/P percutaneous coronary angioplasty     Carney Living PT DPT  04/10/2021, 8:23 AM  Davis Gourd SPT  04/10/2021   During this treatment session, the therapist was present, participating in and directing the treatment.   [facility] [address] [place], [place], [address] Phone: [phone]   Fax:  [phone]  Name: [name] MRN: [id number] Date of Birth: 1946/02/16  [place]) 09/20/2014   Elevated troponin 09/20/2014   Acute renal failure syndrome (HCC)    Malnutrition of moderate degree ([place]) 08/29/2014   Pneumonia    Respiratory failure ([place])    UTI (urinary tract infection) 08/27/2014   Sepsis, unspecified organism ([place]) 08/27/2014   Shock circulatory ([place]) 08/27/2014   Acute kidney injury ([place]) 08/27/2014   Encephalopathy acute 08/27/2014   Hypokalemia 08/26/2014   Hyponatremia 08/26/2014   Acute renal failure ([place]) 08/26/2014   Weakness 08/26/2014   ETOH abuse 08/07/2014   Edema extremities 07/12/2014   Atelectasis 08/16/2011   Diabetes mellitus type 2, insulin dependent (HCC)    COPD (chronic obstructive pulmonary disease) ([place])    Tobacco dependence    Dyslipidemia, goal LDL below 70    Essential hypertension    CAD S/P percutaneous coronary angioplasty     Carney Living PT DPT  04/10/2021, 8:23 AM  Davis Gourd SPT  04/10/2021   During this treatment session, the therapist was present, participating in and directing the treatment.   [facility] [address] [place], [place], [address] Phone: [phone]   Fax:  [phone]  Name: [name] MRN: [id number] Date of Birth: 1946/02/16  [facility] [address] [place], [place], [address] Phone: [phone]   Fax:  [phone]  Physical Therapy Treatment  Patient Details  Name: [name] MRN: [id number] Date of Birth: 24-Dec-1945 Referring Provider (PT): Dr Melina Schools   Encounter Date: 04/09/2021   PT End of Session - 04/09/21 1109     Visit Number 1    Number of Visits 6    Date for PT Re-Evaluation 05/21/21    Authorization Type UHC Medicare 10 th visit progress note    PT Start Time 0845    PT Stop Time 0928    PT Time Calculation (min) 43 min    Activity Tolerance Patient tolerated treatment well    Behavior During Therapy [facility] for tasks assessed/performed             Past Medical History:  Diagnosis Date   Asthma    Breast cancer ([place])    right breast   COPD (chronic obstructive pulmonary disease) ([place])    Coronary artery disease    Diverticulum of esophagus    Elevated LFTs    Emphysema lung ([place])    ETOH abuse    H/O atrial fibrillation without current medication    only one time when she had sepsis   Hyperlipidemia    Hypertension    hx of but not on any medications   Myocardial infarction ([place]) 2000   OA (osteoarthritis) of knee    Osteoarthritis    Tobacco abuse     Past Surgical History:  Procedure Laterality Date   ANTERIOR HIP REVISION Right 10/22/2015   Procedure: RIGHT  HIP REVISION;  Surgeon: Paralee Cancel, MD;  [facility];  Service: Orthopedics;  Laterality: Right;   APPLICATION OF WOUND VAC N/A 06/20/2015   Procedure: APPLICATION OF INCISIONAL WOUND VAC;  Surgeon: Melina Schools, MD;  Location: [place];  Service: Orthopedics;  Laterality: N/A;   BREAST SURGERY  1991   right mastectomy   CARDIAC CATHETERIZATION  04/05/2009   EF 60%   CARDIOVASCULAR STRESS TEST  01/31/2005   EF 58%   CESAREAN SECTION  '78, '80, '81   x 3   CORONARY ANGIOPLASTY  08/1998   x2 OF A BIFURCATION OM-1, OM-2 LESION   CORONARY  ANGIOPLASTY WITH STENT PLACEMENT  01/1999   MID FIRST OBTUSE MARGINAL VESSEL   CORONARY ANGIOPLASTY WITH STENT PLACEMENT  07/1999   STENTING AT THE CRUX OF THE RIGHT CORONARY ARTERY WITH A 3.8MM X 18MM TETRA STENT   DIRECT LARYNGOSCOPY N/A 05/03/2015   Procedure: DIRECT LARYNGOSCOPY;  Surgeon: Jodi Marble, MD;  Location: [facility];  Service: ENT;  Laterality: N/A;   EYE SURGERY  05/18/2014,06/01/2014   BILATERAL CATARACT S WITH LENS IMPLANTS   GASTROSTOMY N/A 05/04/2015   Procedure: OPEN GASTROSTOMY WITH TUBE PLACEMENT;  Surgeon: Donnie Mesa, MD;  Location: [place] [facility];  Service: General;  Laterality: N/A;   GASTROSTOMY N/A 11/13/2016   Procedure: INSERTION OF GASTROSTOMY TUBE;  Surgeon: Coralie Keens, MD;  Location: [facility];  Service: General;  Laterality: N/A;   HARDWARE REMOVAL N/A 05/03/2015   Procedure: HARDWARE REMOVAL;  Surgeon: Melina Schools, MD;  Location: [facility];  Service: Orthopedics;  Laterality: N/A;   HIP CLOSED REDUCTION Right 04/26/2015   Procedure: CLOSED REDUCTION HIP;  Surgeon: Melina Schools, MD;  [facility];  Service: Orthopedics;  Laterality: Right;   HYSTEROSCOPY     D & C   INCISION AND DRAINAGE ABSCESS N/A 05/03/2015  [facility] [address] [place], [place], [address] Phone: [phone]   Fax:  [phone]  Physical Therapy Treatment  Patient Details  Name: [name] MRN: [id number] Date of Birth: 24-Dec-1945 Referring Provider (PT): Dr Melina Schools   Encounter Date: 04/09/2021   PT End of Session - 04/09/21 1109     Visit Number 1    Number of Visits 6    Date for PT Re-Evaluation 05/21/21    Authorization Type UHC Medicare 10 th visit progress note    PT Start Time 0845    PT Stop Time 0928    PT Time Calculation (min) 43 min    Activity Tolerance Patient tolerated treatment well    Behavior During Therapy [facility] for tasks assessed/performed             Past Medical History:  Diagnosis Date   Asthma    Breast cancer ([place])    right breast   COPD (chronic obstructive pulmonary disease) ([place])    Coronary artery disease    Diverticulum of esophagus    Elevated LFTs    Emphysema lung ([place])    ETOH abuse    H/O atrial fibrillation without current medication    only one time when she had sepsis   Hyperlipidemia    Hypertension    hx of but not on any medications   Myocardial infarction ([place]) 2000   OA (osteoarthritis) of knee    Osteoarthritis    Tobacco abuse     Past Surgical History:  Procedure Laterality Date   ANTERIOR HIP REVISION Right 10/22/2015   Procedure: RIGHT  HIP REVISION;  Surgeon: Paralee Cancel, MD;  [facility];  Service: Orthopedics;  Laterality: Right;   APPLICATION OF WOUND VAC N/A 06/20/2015   Procedure: APPLICATION OF INCISIONAL WOUND VAC;  Surgeon: Melina Schools, MD;  Location: [place];  Service: Orthopedics;  Laterality: N/A;   BREAST SURGERY  1991   right mastectomy   CARDIAC CATHETERIZATION  04/05/2009   EF 60%   CARDIOVASCULAR STRESS TEST  01/31/2005   EF 58%   CESAREAN SECTION  '78, '80, '81   x 3   CORONARY ANGIOPLASTY  08/1998   x2 OF A BIFURCATION OM-1, OM-2 LESION   CORONARY  ANGIOPLASTY WITH STENT PLACEMENT  01/1999   MID FIRST OBTUSE MARGINAL VESSEL   CORONARY ANGIOPLASTY WITH STENT PLACEMENT  07/1999   STENTING AT THE CRUX OF THE RIGHT CORONARY ARTERY WITH A 3.8MM X 18MM TETRA STENT   DIRECT LARYNGOSCOPY N/A 05/03/2015   Procedure: DIRECT LARYNGOSCOPY;  Surgeon: Jodi Marble, MD;  Location: [facility];  Service: ENT;  Laterality: N/A;   EYE SURGERY  05/18/2014,06/01/2014   BILATERAL CATARACT S WITH LENS IMPLANTS   GASTROSTOMY N/A 05/04/2015   Procedure: OPEN GASTROSTOMY WITH TUBE PLACEMENT;  Surgeon: Donnie Mesa, MD;  Location: [facility];  Service: General;  Laterality: N/A;   GASTROSTOMY N/A 11/13/2016   Procedure: INSERTION OF GASTROSTOMY TUBE;  Surgeon: Coralie Keens, MD;  Location: [facility];  Service: General;  Laterality: N/A;   HARDWARE REMOVAL N/A 05/03/2015   Procedure: HARDWARE REMOVAL;  Surgeon: Melina Schools, MD;  Location: [facility];  Service: Orthopedics;  Laterality: N/A;   HIP CLOSED REDUCTION Right 04/26/2015   Procedure: CLOSED REDUCTION HIP;  Surgeon: Melina Schools, MD;  [facility];  Service: Orthopedics;  Laterality: Right;   HYSTEROSCOPY     D & C   INCISION AND DRAINAGE ABSCESS N/A 05/03/2015  [place]) 09/20/2014   Elevated troponin 09/20/2014   Acute renal failure syndrome (HCC)    Malnutrition of moderate degree ([place]) 08/29/2014   Pneumonia    Respiratory failure ([place])    UTI (urinary tract infection) 08/27/2014   Sepsis, unspecified organism ([place]) 08/27/2014   Shock circulatory ([place]) 08/27/2014   Acute kidney injury ([place]) 08/27/2014   Encephalopathy acute 08/27/2014   Hypokalemia 08/26/2014   Hyponatremia 08/26/2014   Acute renal failure ([place]) 08/26/2014   Weakness 08/26/2014   ETOH abuse 08/07/2014   Edema extremities 07/12/2014   Atelectasis 08/16/2011   Diabetes mellitus type 2, insulin dependent (HCC)    COPD (chronic obstructive pulmonary disease) ([place])    Tobacco dependence    Dyslipidemia, goal LDL below 70    Essential hypertension    CAD S/P percutaneous coronary angioplasty     Carney Living PT DPT  04/10/2021, 8:23 AM  Davis Gourd SPT  04/10/2021   During this treatment session, the therapist was present, participating in and directing the treatment.   [facility] [address] [place], [place], [address] Phone: [phone]   Fax:  [phone]  Name: [name] MRN: [id number] Date of Birth: 1946/02/16

## 2021-04-18 ENCOUNTER — Ambulatory Visit (HOSPITAL_BASED_OUTPATIENT_CLINIC_OR_DEPARTMENT_OTHER): Payer: Medicare Other | Attending: Orthopedic Surgery | Admitting: Physical Therapy

## 2021-04-18 ENCOUNTER — Encounter (HOSPITAL_BASED_OUTPATIENT_CLINIC_OR_DEPARTMENT_OTHER): Payer: Self-pay | Admitting: Physical Therapy

## 2021-04-18 ENCOUNTER — Other Ambulatory Visit: Payer: Self-pay

## 2021-04-18 DIAGNOSIS — R293 Abnormal posture: Secondary | ICD-10-CM | POA: Insufficient documentation

## 2021-04-18 DIAGNOSIS — M545 Low back pain, unspecified: Secondary | ICD-10-CM | POA: Diagnosis not present

## 2021-04-18 DIAGNOSIS — G8929 Other chronic pain: Secondary | ICD-10-CM | POA: Diagnosis not present

## 2021-04-18 DIAGNOSIS — R2689 Other abnormalities of gait and mobility: Secondary | ICD-10-CM | POA: Diagnosis not present

## 2021-04-19 ENCOUNTER — Encounter (HOSPITAL_BASED_OUTPATIENT_CLINIC_OR_DEPARTMENT_OTHER): Payer: Self-pay | Admitting: Physical Therapy

## 2021-04-19 NOTE — Therapy (Signed)
[facility] [address] [place], [place], [address] Phone: [phone]   Fax:  [phone]  Physical Therapy Treatment  Patient Details  Name: [name] MRN: [id number] Date of Birth: November 22, 1945 Referring Provider (PT): Dr Melina Schools   Encounter Date: 04/18/2021   PT End of Session - 04/18/21 2032     Visit Number 2    Number of Visits 6    Date for PT Re-Evaluation 05/21/21    Authorization Type [employer] Medicare 10 th visit progress note    PT Start Time 0846    PT Stop Time 0927    PT Time Calculation (min) 41 min    Activity Tolerance Patient tolerated treatment well    Behavior During Therapy [facility] for tasks assessed/performed             Past Medical History:  Diagnosis Date   Asthma    Breast cancer ([place])    right breast   COPD (chronic obstructive pulmonary disease) ([place])    Coronary artery disease    Diverticulum of esophagus    Elevated LFTs    Emphysema lung ([place])    ETOH abuse    H/O atrial fibrillation without current medication    only one time when she had sepsis   Hyperlipidemia    Hypertension    hx of but not on any medications   Myocardial infarction ([place]) 2000   OA (osteoarthritis) of knee    Osteoarthritis    Tobacco abuse     Past Surgical History:  Procedure Laterality Date   ANTERIOR HIP REVISION Right 10/22/2015   Procedure: RIGHT  HIP REVISION;  Surgeon: Paralee Cancel, MD;  Location: [facility];  Service: Orthopedics;  Laterality: Right;   APPLICATION OF WOUND VAC N/A 06/20/2015   Procedure: APPLICATION OF INCISIONAL WOUND VAC;  Surgeon: Melina Schools, MD;  Location: [facility];  Service: Orthopedics;  Laterality: N/A;   BREAST SURGERY  1991   right mastectomy   CARDIAC CATHETERIZATION  04/05/2009   EF 60%   CARDIOVASCULAR STRESS TEST  01/31/2005   EF 58%   CESAREAN SECTION  '78, '80, '81   x 3   CORONARY ANGIOPLASTY  08/1998   x2 OF A BIFURCATION OM-1, OM-2 LESION   CORONARY  ANGIOPLASTY WITH STENT PLACEMENT  01/1999   MID FIRST OBTUSE MARGINAL VESSEL   CORONARY ANGIOPLASTY WITH STENT PLACEMENT  07/1999   STENTING AT THE CRUX OF THE RIGHT CORONARY ARTERY WITH A 3.8MM X 18MM TETRA STENT   DIRECT LARYNGOSCOPY N/A 05/03/2015   Procedure: DIRECT LARYNGOSCOPY;  Surgeon: Jodi Marble, MD;  Location: [place];  Service: ENT;  Laterality: N/A;   EYE SURGERY  05/18/2014,06/01/2014   BILATERAL CATARACT S WITH LENS IMPLANTS   GASTROSTOMY N/A 05/04/2015   Procedure: OPEN GASTROSTOMY WITH TUBE PLACEMENT;  Surgeon: Donnie Mesa, MD;  Location: [facility];  Service: General;  Laterality: N/A;   GASTROSTOMY N/A 11/13/2016   Procedure: INSERTION OF GASTROSTOMY TUBE;  Surgeon: Coralie Keens, MD;  Location: [facility];  Service: General;  Laterality: N/A;   HARDWARE REMOVAL N/A 05/03/2015   Procedure: HARDWARE REMOVAL;  Surgeon: Melina Schools, MD;  Location: [facility];  Service: Orthopedics;  Laterality: N/A;   HIP CLOSED REDUCTION Right 04/26/2015   Procedure: CLOSED REDUCTION HIP;  Surgeon: Melina Schools, MD;  Location[facility];  Service: Orthopedics;  Laterality: Right;   HYSTEROSCOPY     D & C   INCISION AND DRAINAGE ABSCESS N/A 05/03/2015  Rotation and Scapular Retraction with Resistance - 1 x daily - 7 x weekly - 3 sets - 10 reps    Consulted and Agree with Plan of Care Patient             Patient will benefit from skilled therapeutic intervention in order to improve the following deficits and impairments:   Abnormal gait, Decreased endurance, Decreased mobility, Difficulty walking, Increased muscle spasms, Pain, Decreased range of motion, Improper body mechanics, Impaired flexibility, Impaired UE functional use, Decreased strength, Decreased activity tolerance  Visit Diagnosis: Abnormal posture  Other abnormalities of gait and mobility  Chronic bilateral low back pain without sciatica     Problem List Patient Active Problem List   Diagnosis Date Noted   Closed fracture of shaft of right femur ([place]) 06/13/2019   Pressure injury of skin 09/22/2017   Chronic pain 09/20/2017   Abscess of upper lobe of left lung with pneumonia ([place])    Tobacco consumption 02/12/2016   Hoarseness of voice 01/07/2016   S/P right TH revision 10/22/2015   S/P revision of total hip 10/22/2015   Infected wound, initial encounter 06/20/2015   Cholecystitis    Esophageal fistula    Abscess    Discitis    HCAP (healthcare-associated pneumonia)    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   Abscess of neck    Esophageal perforation    Loss of weight    Esophageal dysphagia    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   Hip dislocation, right ([place]) 04/26/2015   Healthcare-associated pneumonia 04/26/2015   Cholecystitis, acute 04/26/2015   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[place]) 04/26/2015   PAF (paroxysmal atrial fibrillation) ([place]) 09/20/2014   Elevated troponin 09/20/2014   Acute renal failure syndrome ([place])    Malnutrition of moderate degree ([place]) 08/29/2014   Pneumonia    Respiratory failure ([place])    UTI (urinary tract infection) 08/27/2014   Sepsis, unspecified organism ([place]) 08/27/2014   Shock circulatory ([place]) 08/27/2014   Acute kidney injury ([place]) 08/27/2014   Encephalopathy acute 08/27/2014   Hypokalemia 08/26/2014   Hyponatremia 08/26/2014   Acute renal failure ([place]) 08/26/2014   Weakness 08/26/2014   ETOH abuse 08/07/2014   Edema extremities 07/12/2014   Atelectasis  08/16/2011   Diabetes mellitus type 2, insulin dependent (HCC)    COPD (chronic obstructive pulmonary disease) ([place])    Tobacco dependence    Dyslipidemia, goal LDL below 70    Essential hypertension    CAD S/P percutaneous coronary angioplasty     Carney Living PT DPT  04/19/2021, 9:15 AM  [facility] [place], [place], [address] Phone: [phone]   Fax:  [phone]  Name: [name] MRN: [id number] Date of Birth: April 26, 1946  Rotation and Scapular Retraction with Resistance - 1 x daily - 7 x weekly - 3 sets - 10 reps    Consulted and Agree with Plan of Care Patient             Patient will benefit from skilled therapeutic intervention in order to improve the following deficits and impairments:   Abnormal gait, Decreased endurance, Decreased mobility, Difficulty walking, Increased muscle spasms, Pain, Decreased range of motion, Improper body mechanics, Impaired flexibility, Impaired UE functional use, Decreased strength, Decreased activity tolerance  Visit Diagnosis: Abnormal posture  Other abnormalities of gait and mobility  Chronic bilateral low back pain without sciatica     Problem List Patient Active Problem List   Diagnosis Date Noted   Closed fracture of shaft of right femur ([place]) 06/13/2019   Pressure injury of skin 09/22/2017   Chronic pain 09/20/2017   Abscess of upper lobe of left lung with pneumonia ([place])    Tobacco consumption 02/12/2016   Hoarseness of voice 01/07/2016   S/P right TH revision 10/22/2015   S/P revision of total hip 10/22/2015   Infected wound, initial encounter 06/20/2015   Cholecystitis    Esophageal fistula    Abscess    Discitis    HCAP (healthcare-associated pneumonia)    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   Abscess of neck    Esophageal perforation    Loss of weight    Esophageal dysphagia    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   Hip dislocation, right ([place]) 04/26/2015   Healthcare-associated pneumonia 04/26/2015   Cholecystitis, acute 04/26/2015   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[place]) 04/26/2015   PAF (paroxysmal atrial fibrillation) ([place]) 09/20/2014   Elevated troponin 09/20/2014   Acute renal failure syndrome ([place])    Malnutrition of moderate degree ([place]) 08/29/2014   Pneumonia    Respiratory failure ([place])    UTI (urinary tract infection) 08/27/2014   Sepsis, unspecified organism ([place]) 08/27/2014   Shock circulatory ([place]) 08/27/2014   Acute kidney injury ([place]) 08/27/2014   Encephalopathy acute 08/27/2014   Hypokalemia 08/26/2014   Hyponatremia 08/26/2014   Acute renal failure ([place]) 08/26/2014   Weakness 08/26/2014   ETOH abuse 08/07/2014   Edema extremities 07/12/2014   Atelectasis  08/16/2011   Diabetes mellitus type 2, insulin dependent (HCC)    COPD (chronic obstructive pulmonary disease) ([place])    Tobacco dependence    Dyslipidemia, goal LDL below 70    Essential hypertension    CAD S/P percutaneous coronary angioplasty     Carney Living PT DPT  04/19/2021, 9:15 AM  [facility] [place], [place], [address] Phone: [phone]   Fax:  [phone]  Name: [name] MRN: [id number] Date of Birth: April 26, 1946  [facility] [address] [place], [place], [address] Phone: [phone]   Fax:  [phone]  Physical Therapy Treatment  Patient Details  Name: [name] MRN: [id number] Date of Birth: November 22, 1945 Referring Provider (PT): Dr Melina Schools   Encounter Date: 04/18/2021   PT End of Session - 04/18/21 2032     Visit Number 2    Number of Visits 6    Date for PT Re-Evaluation 05/21/21    Authorization Type [employer] Medicare 10 th visit progress note    PT Start Time 0846    PT Stop Time 0927    PT Time Calculation (min) 41 min    Activity Tolerance Patient tolerated treatment well    Behavior During Therapy [facility] for tasks assessed/performed             Past Medical History:  Diagnosis Date   Asthma    Breast cancer ([place])    right breast   COPD (chronic obstructive pulmonary disease) ([place])    Coronary artery disease    Diverticulum of esophagus    Elevated LFTs    Emphysema lung ([place])    ETOH abuse    H/O atrial fibrillation without current medication    only one time when she had sepsis   Hyperlipidemia    Hypertension    hx of but not on any medications   Myocardial infarction ([place]) 2000   OA (osteoarthritis) of knee    Osteoarthritis    Tobacco abuse     Past Surgical History:  Procedure Laterality Date   ANTERIOR HIP REVISION Right 10/22/2015   Procedure: RIGHT  HIP REVISION;  Surgeon: Paralee Cancel, MD;  [facility];  Service: Orthopedics;  Laterality: Right;   APPLICATION OF WOUND VAC N/A 06/20/2015   Procedure: APPLICATION OF INCISIONAL WOUND VAC;  Surgeon: Melina Schools, MD;  Location: [facility];  Service: Orthopedics;  Laterality: N/A;   BREAST SURGERY  1991   right mastectomy   CARDIAC CATHETERIZATION  04/05/2009   EF 60%   CARDIOVASCULAR STRESS TEST  01/31/2005   EF 58%   CESAREAN SECTION  '78, '80, '81   x 3   CORONARY ANGIOPLASTY  08/1998   x2 OF A BIFURCATION OM-1, OM-2 LESION   CORONARY  ANGIOPLASTY WITH STENT PLACEMENT  01/1999   MID FIRST OBTUSE MARGINAL VESSEL   CORONARY ANGIOPLASTY WITH STENT PLACEMENT  07/1999   STENTING AT THE CRUX OF THE RIGHT CORONARY ARTERY WITH A 3.8MM X 18MM TETRA STENT   DIRECT LARYNGOSCOPY N/A 05/03/2015   Procedure: DIRECT LARYNGOSCOPY;  Surgeon: Jodi Marble, MD;  Location: [facility];  Service: ENT;  Laterality: N/A;   EYE SURGERY  05/18/2014,06/01/2014   BILATERAL CATARACT S WITH LENS IMPLANTS   GASTROSTOMY N/A 05/04/2015   Procedure: OPEN GASTROSTOMY WITH TUBE PLACEMENT;  Surgeon: Donnie Mesa, MD;  Location: [facility];  Service: General;  Laterality: N/A;   GASTROSTOMY N/A 11/13/2016   Procedure: INSERTION OF GASTROSTOMY TUBE;  Surgeon: Coralie Keens, MD;  Location: [facility];  Service: General;  Laterality: N/A;   HARDWARE REMOVAL N/A 05/03/2015   Procedure: HARDWARE REMOVAL;  Surgeon: Melina Schools, MD;  Location: [facility];  Service: Orthopedics;  Laterality: N/A;   HIP CLOSED REDUCTION Right 04/26/2015   Procedure: CLOSED REDUCTION HIP;  Surgeon: Melina Schools, MD;  [facility];  Service: Orthopedics;  Laterality: Right;   HYSTEROSCOPY     D & C   INCISION AND DRAINAGE ABSCESS N/A 05/03/2015

## 2021-04-23 ENCOUNTER — Other Ambulatory Visit: Payer: Self-pay

## 2021-04-23 ENCOUNTER — Encounter (HOSPITAL_BASED_OUTPATIENT_CLINIC_OR_DEPARTMENT_OTHER): Payer: Self-pay | Admitting: Physical Therapy

## 2021-04-23 ENCOUNTER — Ambulatory Visit (HOSPITAL_BASED_OUTPATIENT_CLINIC_OR_DEPARTMENT_OTHER): Payer: Medicare Other | Admitting: Physical Therapy

## 2021-04-23 DIAGNOSIS — R2689 Other abnormalities of gait and mobility: Secondary | ICD-10-CM | POA: Diagnosis not present

## 2021-04-23 DIAGNOSIS — R293 Abnormal posture: Secondary | ICD-10-CM

## 2021-04-23 DIAGNOSIS — M545 Low back pain, unspecified: Secondary | ICD-10-CM | POA: Diagnosis not present

## 2021-04-23 DIAGNOSIS — G8929 Other chronic pain: Secondary | ICD-10-CM

## 2021-04-23 NOTE — Therapy (Addendum)
and Scapular Retraction with Resistance - 1 x daily - 7 x weekly - 3 sets - 10 reps    Consulted and Agree with Plan of Care Patient             Patient will benefit from skilled therapeutic intervention in order to improve the following deficits and impairments:  Abnormal gait, Decreased  endurance, Decreased mobility, Difficulty walking, Increased muscle spasms, Pain, Decreased range of motion, Improper body mechanics, Impaired flexibility, Impaired UE functional use, Decreased strength, Decreased activity tolerance  Visit Diagnosis: Abnormal posture  Other abnormalities of gait and mobility  Chronic bilateral low back pain without sciatica     Problem List Patient Active Problem List   Diagnosis Date Noted   Closed fracture of shaft of right femur ([place]) 06/13/2019   Pressure injury of skin 09/22/2017   Chronic pain 09/20/2017   Abscess of upper lobe of left lung with pneumonia ([place])    Tobacco consumption 02/12/2016   Hoarseness of voice 01/07/2016   S/P right TH revision 10/22/2015   S/P revision of total hip 10/22/2015   Infected wound, initial encounter 06/20/2015   Cholecystitis    Esophageal fistula    Abscess    Discitis    HCAP (healthcare-associated pneumonia)    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/28/2015   Abscess of neck    Esophageal perforation    Loss of weight    Esophageal dysphagia    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   Hip dislocation, right ([place]) 04/26/2015   Healthcare-associated pneumonia 04/26/2015   Cholecystitis, acute 04/26/2015   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[place]) 04/26/2015   PAF (paroxysmal atrial fibrillation) ([place]) 09/20/2014   Elevated troponin 09/20/2014   Acute renal failure syndrome ([place])    Malnutrition of moderate degree ([place]) 08/29/2014   Pneumonia    Respiratory failure ([place])    UTI (urinary tract infection) 08/27/2014   Sepsis, unspecified organism ([place]) 08/27/2014   Shock circulatory ([place]) 08/27/2014   Acute kidney injury ([place]) 08/27/2014   Encephalopathy acute 08/27/2014   Hypokalemia 08/26/2014   Hyponatremia 08/26/2014   Acute renal failure ([place]) 08/26/2014   Weakness 08/26/2014   ETOH abuse 08/07/2014   Edema extremities 07/12/2014   Atelectasis 08/16/2011   Diabetes  mellitus type 2, insulin dependent (HCC)    COPD (chronic obstructive pulmonary disease) ([place])    Tobacco dependence    Dyslipidemia, goal LDL below 70    Essential hypertension    CAD S/P percutaneous coronary angioplasty    PHYSICAL THERAPY DISCHARGE SUMMARY  Visits from Start of Care: 3  Current functional level related to goals / functional outcomes: No real change    Remaining deficits: No real change    Education / Equipment: HEP Patient agrees to discharge. Patient goals were not met. Patient is being discharged due to the patient's request.   Carney Living PT DPT  04/23/2021, 9:43 AM  Billey Co SPT 04/23/2021, 9:43  During this treatment session, the therapist was present, participating in and directing the treatment.  [facility] [address] [place], [place], [address] Phone: [phone]   Fax:  [phone]  Name: SHARYL PANCHAL MRN: [id number] Date of Birth: 12-31-1945  [facility] [address] [place], [place], [address] Phone: [phone]   Fax:  [phone]  Physical Therapy Treatment/Discharge   Patient Details  Name: [name] MRN: [id number] Date of Birth: 29-Jan-1946 Referring Provider (PT): Dr Melina Schools   Encounter Date: 04/23/2021   PT End of Session - 04/23/21 0935     Visit Number 3    Number of Visits 6    Date for PT Re-Evaluation 05/21/21    Authorization Type [employer] Medicare 10 th visit progress note    PT Start Time 0845    PT Stop Time 0930    PT Time Calculation (min) 45 min    Activity Tolerance Patient tolerated treatment well    Behavior During Therapy [facility] for tasks assessed/performed             Past Medical History:  Diagnosis Date   Asthma    Breast cancer ([place])    right breast   COPD (chronic obstructive pulmonary disease) ([place])    Coronary artery disease    Diverticulum of esophagus    Elevated LFTs    Emphysema lung ([place])    ETOH abuse    H/O atrial fibrillation without current medication    only one time when she had sepsis   Hyperlipidemia    Hypertension    hx of but not on any medications   Myocardial infarction ([place]) 2000   OA (osteoarthritis) of knee    Osteoarthritis    Tobacco abuse     Past Surgical History:  Procedure Laterality Date   ANTERIOR HIP REVISION Right 10/22/2015   Procedure: RIGHT  HIP REVISION;  Surgeon: Paralee Cancel, MD;  Location: [facility];  Service: Orthopedics;  Laterality: Right;   APPLICATION OF WOUND VAC N/A 06/20/2015   Procedure: APPLICATION OF INCISIONAL WOUND VAC;  Surgeon: Melina Schools, MD;  Location: [facility];  Service: Orthopedics;  Laterality: N/A;   BREAST SURGERY  1991   right mastectomy   CARDIAC CATHETERIZATION  04/05/2009   EF 60%   CARDIOVASCULAR STRESS TEST  01/31/2005   EF 58%   CESAREAN SECTION  '78, '80, '81   x 3   CORONARY ANGIOPLASTY  08/1998   x2 OF A BIFURCATION OM-1, OM-2 LESION    CORONARY ANGIOPLASTY WITH STENT PLACEMENT  01/1999   MID FIRST OBTUSE MARGINAL VESSEL   CORONARY ANGIOPLASTY WITH STENT PLACEMENT  07/1999   STENTING AT THE CRUX OF THE RIGHT CORONARY ARTERY WITH A 3.8MM X 18MM TETRA STENT   DIRECT LARYNGOSCOPY N/A 05/03/2015   Procedure: DIRECT LARYNGOSCOPY;  Surgeon: Jodi Marble, MD;  Location: [facility];  Service: ENT;  Laterality: N/A;   EYE SURGERY  05/18/2014,06/01/2014   BILATERAL CATARACT S WITH LENS IMPLANTS   GASTROSTOMY N/A 05/04/2015   Procedure: OPEN GASTROSTOMY WITH TUBE PLACEMENT;  Surgeon: Donnie Mesa, MD;  Location: [facility];  Service: General;  Laterality: N/A;   GASTROSTOMY N/A 11/13/2016   Procedure: INSERTION OF GASTROSTOMY TUBE;  Surgeon: Coralie Keens, MD;  Location: [facility];  Service: General;  Laterality: N/A;   HARDWARE REMOVAL N/A 05/03/2015   Procedure: HARDWARE REMOVAL;  Surgeon: Melina Schools, MD;  Location: [facility];  Service: Orthopedics;  Laterality: N/A;   HIP CLOSED REDUCTION Right 04/26/2015   Procedure: CLOSED REDUCTION HIP;  Surgeon: Melina Schools, MD;  Location: [facility];  Service: Orthopedics;  Laterality: Right;   HYSTEROSCOPY     D & C   INCISION AND DRAINAGE ABSCESS N/A 05/03/2015  and Scapular Retraction with Resistance - 1 x daily - 7 x weekly - 3 sets - 10 reps    Consulted and Agree with Plan of Care Patient             Patient will benefit from skilled therapeutic intervention in order to improve the following deficits and impairments:  Abnormal gait, Decreased  endurance, Decreased mobility, Difficulty walking, Increased muscle spasms, Pain, Decreased range of motion, Improper body mechanics, Impaired flexibility, Impaired UE functional use, Decreased strength, Decreased activity tolerance  Visit Diagnosis: Abnormal posture  Other abnormalities of gait and mobility  Chronic bilateral low back pain without sciatica     Problem List Patient Active Problem List   Diagnosis Date Noted   Closed fracture of shaft of right femur ([place]) 06/13/2019   Pressure injury of skin 09/22/2017   Chronic pain 09/20/2017   Abscess of upper lobe of left lung with pneumonia ([place])    Tobacco consumption 02/12/2016   Hoarseness of voice 01/07/2016   S/P right TH revision 10/22/2015   S/P revision of total hip 10/22/2015   Infected wound, initial encounter 06/20/2015   Cholecystitis    Esophageal fistula    Abscess    Discitis    HCAP (healthcare-associated pneumonia)    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/28/2015   Abscess of neck    Esophageal perforation    Loss of weight    Esophageal dysphagia    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   Hip dislocation, right ([place]) 04/26/2015   Healthcare-associated pneumonia 04/26/2015   Cholecystitis, acute 04/26/2015   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[place]) 04/26/2015   PAF (paroxysmal atrial fibrillation) ([place]) 09/20/2014   Elevated troponin 09/20/2014   Acute renal failure syndrome ([place])    Malnutrition of moderate degree ([place]) 08/29/2014   Pneumonia    Respiratory failure ([place])    UTI (urinary tract infection) 08/27/2014   Sepsis, unspecified organism ([place]) 08/27/2014   Shock circulatory ([place]) 08/27/2014   Acute kidney injury ([place]) 08/27/2014   Encephalopathy acute 08/27/2014   Hypokalemia 08/26/2014   Hyponatremia 08/26/2014   Acute renal failure ([place]) 08/26/2014   Weakness 08/26/2014   ETOH abuse 08/07/2014   Edema extremities 07/12/2014   Atelectasis 08/16/2011   Diabetes  mellitus type 2, insulin dependent (HCC)    COPD (chronic obstructive pulmonary disease) ([place])    Tobacco dependence    Dyslipidemia, goal LDL below 70    Essential hypertension    CAD S/P percutaneous coronary angioplasty    PHYSICAL THERAPY DISCHARGE SUMMARY  Visits from Start of Care: 3  Current functional level related to goals / functional outcomes: No real change    Remaining deficits: No real change    Education / Equipment: HEP Patient agrees to discharge. Patient goals were not met. Patient is being discharged due to the patient's request.   Carney Living PT DPT  04/23/2021, 9:43 AM  Billey Co SPT 04/23/2021, 9:43  During this treatment session, the therapist was present, participating in and directing the treatment.  [facility] [address] [place], [place], [address] Phone: [phone]   Fax:  [phone]  Name: SHARYL PANCHAL MRN: [id number] Date of Birth: 12-31-1945  and Scapular Retraction with Resistance - 1 x daily - 7 x weekly - 3 sets - 10 reps    Consulted and Agree with Plan of Care Patient             Patient will benefit from skilled therapeutic intervention in order to improve the following deficits and impairments:  Abnormal gait, Decreased  endurance, Decreased mobility, Difficulty walking, Increased muscle spasms, Pain, Decreased range of motion, Improper body mechanics, Impaired flexibility, Impaired UE functional use, Decreased strength, Decreased activity tolerance  Visit Diagnosis: Abnormal posture  Other abnormalities of gait and mobility  Chronic bilateral low back pain without sciatica     Problem List Patient Active Problem List   Diagnosis Date Noted   Closed fracture of shaft of right femur ([place]) 06/13/2019   Pressure injury of skin 09/22/2017   Chronic pain 09/20/2017   Abscess of upper lobe of left lung with pneumonia ([place])    Tobacco consumption 02/12/2016   Hoarseness of voice 01/07/2016   S/P right TH revision 10/22/2015   S/P revision of total hip 10/22/2015   Infected wound, initial encounter 06/20/2015   Cholecystitis    Esophageal fistula    Abscess    Discitis    HCAP (healthcare-associated pneumonia)    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/28/2015   Abscess of neck    Esophageal perforation    Loss of weight    Esophageal dysphagia    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   Hip dislocation, right ([place]) 04/26/2015   Healthcare-associated pneumonia 04/26/2015   Cholecystitis, acute 04/26/2015   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[place]) 04/26/2015   PAF (paroxysmal atrial fibrillation) ([place]) 09/20/2014   Elevated troponin 09/20/2014   Acute renal failure syndrome ([place])    Malnutrition of moderate degree ([place]) 08/29/2014   Pneumonia    Respiratory failure ([place])    UTI (urinary tract infection) 08/27/2014   Sepsis, unspecified organism ([place]) 08/27/2014   Shock circulatory ([place]) 08/27/2014   Acute kidney injury ([place]) 08/27/2014   Encephalopathy acute 08/27/2014   Hypokalemia 08/26/2014   Hyponatremia 08/26/2014   Acute renal failure ([place]) 08/26/2014   Weakness 08/26/2014   ETOH abuse 08/07/2014   Edema extremities 07/12/2014   Atelectasis 08/16/2011   Diabetes  mellitus type 2, insulin dependent (HCC)    COPD (chronic obstructive pulmonary disease) ([place])    Tobacco dependence    Dyslipidemia, goal LDL below 70    Essential hypertension    CAD S/P percutaneous coronary angioplasty    PHYSICAL THERAPY DISCHARGE SUMMARY  Visits from Start of Care: 3  Current functional level related to goals / functional outcomes: No real change    Remaining deficits: No real change    Education / Equipment: HEP Patient agrees to discharge. Patient goals were not met. Patient is being discharged due to the patient's request.   Carney Living PT DPT  04/23/2021, 9:43 AM  Billey Co SPT 04/23/2021, 9:43  During this treatment session, the therapist was present, participating in and directing the treatment.  [facility] [address] [place], [place], [address] Phone: [phone]   Fax:  [phone]  Name: SHARYL PANCHAL MRN: [id number] Date of Birth: 12-31-1945

## 2021-04-30 ENCOUNTER — Ambulatory Visit (HOSPITAL_BASED_OUTPATIENT_CLINIC_OR_DEPARTMENT_OTHER): Payer: Medicare Other | Admitting: Physical Therapy

## 2021-05-07 ENCOUNTER — Ambulatory Visit (HOSPITAL_BASED_OUTPATIENT_CLINIC_OR_DEPARTMENT_OTHER): Payer: Medicare Other | Admitting: Physical Therapy

## 2021-05-12 NOTE — Progress Notes (Signed)
Cardiology Office Note   Date:  05/14/2021   ID:  Rachel, [name] August 25, 1946, MRN [id number]  PCP:  Ileana Ladd, MD  Cardiologist:  Peter Swaziland, MD EP: None  Chief Complaint  Patient presents with   Follow-up    Recent angina       History of Present Illness: [name] is a 75 y.o. female with a PMH of CAD s/p PCI to OM1 in 1990s and PCI to RCA in 2000, HTN, HLD, paroxysmal atrial fibrillation not on anticoagulation, breast cancer, diverticulum of the esophagus c/b fistula/perforation, COPD, and tobacco abuse, who presents for evaluation of recent bilateral arm pain.  Cardiac history dates back to the 1990s when she had an MI managed with PCI to OM1.  She then had another MI in 2000 and underwent PCI to RCA at that time.  Her last cardiac cath was in 2010 and looked good according to Dr. Swaziland.  Her last ischemic evaluation was a nuclear stress test in 2015 which was low risk.  She had an echocardiogram in 2015 showing EF 60 to 65% with moderate MR.  She is noted to have an isolated episode of paroxysmal atrial fibrillation in 2015 in the setting of sepsis.  Anticoagulation was not felt to be needed going forward.  In addition to her cardiac history she has had spinal surgeries for management of cervical discitis and spinal stenosis.  She has also had bilateral hip replacements, one in 2016 for management of a dislocated hip, and again in 2020 for the right hip after a fall.  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  She had a gastrostomy tube placed.  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  She was last seen by cardiology at an outpatient visit with Corine Shelter, PA-C 06/2020 at which time she was doing well from a cardiac standpoint without any anginal complaints (anginal equivalent is  shoulder pain), though unfortunately had resumed smoking.  No medication changes occurred at that visit.  She was encouraged to quit smoking and recommended to follow-up with Dr. Swaziland in 1 year.  She presents today with complaints of arm pain. She reports an episode of bilateral arm discomfort several weeks ago while gardening that was reminiscent of symptoms experienced prior to her MI's. She had no associated chest pain, SOB, diaphoresis, or palpitations with her arm discomfort. Symptoms lasted for >1 hour and resolved after taking SL nitro. She has not had any recurrence since that time and has continued to stay active, battling the crabgrass in her garden. She has not needed any SL nitro aside from that one episode. No complaints of SOB, DOE, change in chronic LE edema, orthopnea, PND, syncope, or palpitations.       Past Medical History:  Diagnosis Date   Asthma    Breast cancer (HCC)    right breast   COPD (chronic obstructive pulmonary disease) (HCC)    Coronary artery disease    Diverticulum of esophagus    Elevated LFTs    Emphysema lung (HCC)    ETOH abuse    H/O atrial fibrillation without current medication    only one time when she had sepsis   Hyperlipidemia    Hypertension    hx of but not on any medications   Myocardial infarction (HCC) 2000   OA (  osteoarthritis) of knee    Osteoarthritis    Tobacco abuse     Past Surgical History:  Procedure Laterality Date   ANTERIOR HIP REVISION Right 10/22/2015   Procedure: RIGHT  HIP REVISION;  Surgeon: Durene Romans, MD;  Location: [facility];  Service: Orthopedics;  Laterality: Right;   APPLICATION OF WOUND VAC N/A 06/20/2015   Procedure: APPLICATION OF INCISIONAL WOUND VAC;  Surgeon: Venita Lick, MD;  Location: [facility] OR;  Service: Orthopedics;  Laterality: N/A;   BREAST SURGERY  1991   right mastectomy   CARDIAC CATHETERIZATION  04/05/2009   EF 60%   CARDIOVASCULAR STRESS TEST  01/31/2005   EF 58%   CESAREAN SECTION  '78, '80,  '81   x 3   CORONARY ANGIOPLASTY  08/1998   x2 OF A BIFURCATION OM-1, OM-2 LESION   CORONARY ANGIOPLASTY WITH STENT PLACEMENT  01/1999   MID FIRST OBTUSE MARGINAL VESSEL   CORONARY ANGIOPLASTY WITH STENT PLACEMENT  07/1999   STENTING AT THE CRUX OF THE RIGHT CORONARY ARTERY WITH A 3.8MM X TETRA STENT   DIRECT LARYNGOSCOPY N/A 05/03/2015   Procedure: DIRECT LARYNGOSCOPY;  Surgeon: Flo Shanks, MD;  Location: [facility] OR;  Service: ENT;  Laterality: N/A;   EYE SURGERY  05/18/2014,06/01/2014   BILATERAL CATARACT S WITH LENS IMPLANTS   GASTROSTOMY N/A 05/04/2015   Procedure: OPEN GASTROSTOMY WITH TUBE PLACEMENT;  Surgeon: Manus Rudd, MD;  Location: [facility];  Service: General;  Laterality: N/A;   GASTROSTOMY N/A 11/13/2016   Procedure: INSERTION OF GASTROSTOMY TUBE;  Surgeon: Abigail Miyamoto, MD;  Location: [facility] OR;  Service: General;  Laterality: N/A;   HARDWARE REMOVAL N/A 05/03/2015   Procedure: HARDWARE REMOVAL;  Surgeon: Venita Lick, MD;  Location: [facility];  Service: Orthopedics;  Laterality: N/A;   HIP CLOSED REDUCTION Right 04/26/2015   Procedure: CLOSED REDUCTION HIP;  Surgeon: Venita Lick, MD;  Location: [facility];  Service: Orthopedics;  Laterality: Right;   HYSTEROSCOPY     D & C   INCISION AND DRAINAGE ABSCESS N/A 05/03/2015   Procedure: INCISION AND DRAINAGE CERVICAL  ABSCESS AND REMOVAL OF HARDWARE;  Surgeon: Venita Lick, MD;  Location: [facility];  Service: Orthopedics;  Laterality: N/A;   IR CM INJ ANY COLONIC TUBE W/FLUORO  10/14/2017   IR CM INJ ANY COLONIC TUBE W/FLUORO  10/23/2017   IR CM INJ ANY COLONIC TUBE W/FLUORO  10/28/2017   IR REPLC GASTRO/COLONIC TUBE PERCUT W/FLUORO  09/22/2017   JOINT REPLACEMENT  08/2011   bilateral hip   JOINT REPLACEMENT  01/2012   right hip   MASTECTOMY     neck fusion  2011   ORIF FEMUR FRACTURE Right 06/14/2019   Procedure: ORIF PERI PROSTHETIC FEMUR FRACTURE;  Surgeon: Durene Romans, MD;  Location: [facility] OR;  Service: Orthopedics;  Laterality: Right;    PELVIC LAPAROSCOPY  2002   RSO-     RADICAL NECK DISSECTION N/A 11/08/2016   Procedure: INCISION AND DRAINAGE OF NECK ABSCESS;  Surgeon: Osborn Coho, MD;  Location: [facility] OR;  Service: ENT;  Laterality: N/A;   RADIOLOGY WITH ANESTHESIA Right 06/28/2015   Procedure: MRI OF CERVICAL SPINE  AND RIGHT HIP  WITH AND WITHOUT CONTRAST    (RADIOLOGY WITH ANESTHESIA);  Surgeon: Medication Radiologist, MD;  Location: [facility] OR;  Service: Radiology;  Laterality: Right;   REMOVAL OF GASTROSTOMY TUBE N/A 11/14/2016   Procedure: REMOVAL OF GASTROSTOMY TUBE W/ REPLACEMENT OF GASTROSTOMY TUBE;  Surgeon: Abigail Miyamoto, MD;  Location: [facility] OR;  Service: General;  Laterality: N/A;   RIGID ESOPHAGOSCOPY N/A 05/03/2015   Procedure: RIGID ESOPHAGOSCOPY;  Surgeon: Flo Shanks, MD;  Location: [facility] OR;  Service: ENT;  Laterality: N/A;   TONSILLECTOMY AND ADENOIDECTOMY     TOTAL HIP ARTHROPLASTY  08/2010   bilat   VULVECTOMY  1981   partial     Current Outpatient Medications  Medication Sig Dispense Refill   albuterol (PROVENTIL HFA;VENTOLIN HFA) 108 (90 BASE) MCG/ACT inhaler Inhale 2 puffs into the lungs every 6 (six) hours as needed for wheezing.      ALPRAZolam (XANAX) 0.25 MG tablet Take one tablet daily or as needed 90 tablet 3   aspirin EC 81 MG EC tablet Take 1 tablet (81 mg total) by mouth 2 (two) times daily. 60 tablet 0   budesonide-formoterol (SYMBICORT) 160-4.5 MCG/ACT inhaler Inhale 2 puffs into the lungs 2 (two) times daily as needed (Wheezing). Reported on 10/16/2015     diclofenac (VOLTAREN) 75 MG EC tablet Take 75 mg by mouth daily as needed for pain.     diclofenac sodium (VOLTAREN) 1 % GEL Apply 1 application topically 4 (four) times daily as needed for pain.     gabapentin (NEURONTIN) 600 MG tablet Take 600 mg by mouth 4 (four) times daily.      losartan (COZAAR) 50 MG tablet TAKE 1 TABLET BY MOUTH EVERY DAY 90 tablet 2   metFORMIN (GLUCOPHAGE-XR) 500 MG 24 hr tablet metformin ER 500 mg  tablet,extended release 24 hr     methocarbamol (ROBAXIN) 500 MG tablet Take 500 mg by mouth daily as needed.     morphine (MS CONTIN) 30 MG 12 hr tablet Take 30 mg by mouth 4 (four) times daily.      nicotine (NICODERM CQ - DOSED IN MG/24 HOURS) 14 mg/24hr patch Place 1 patch (14 mg total) onto the skin daily. 28 patch 0   oxyCODONE (ROXICODONE) 15 MG immediate release tablet Take 15 mg by mouth 4 (four) times daily.     rosuvastatin (CRESTOR) 10 MG tablet TAKE 1 TABLET BY MOUTH EVERY DAY 90 tablet 2   furosemide (LASIX) 20 MG tablet Take 1 tablet (20 mg total) by mouth daily as needed for edema. 90 tablet 3   nitroGLYCERIN (NITROSTAT) 0.4 MG SL tablet Place 1 tablet (0.4 mg total) under the tongue every 5 (five) minutes as needed for chest pain. 25 tablet 3   No current facility-administered medications for this visit.    Allergies:   Cephalexin, Chlorhexidine, Prednisone, and Zithromax [azithromycin dihydrate]    Social History:  The patient  reports that she has quit smoking. Her smoking use included cigarettes. She has a 50.00 pack-year smoking history. She has never used smokeless tobacco. She reports that she does not drink alcohol and does not use drugs.   Family History:  The patient's family history includes Diabetes in her mother; Heart attack in her father; Heart disease in her father; Hypertension in her father; Stroke in her father.    ROS:  Please see the history of present illness.   Otherwise, review of systems are positive for none.   All other systems are reviewed and negative.    PHYSICAL EXAM: VS:  BP 132/78   Pulse 60   Ht 5' 1.5" (1.562 m)   Wt 152 lb 6.4 oz (69.1 kg)   BMI 28.33 kg/m  , BMI Body mass index is 28.33 kg/m. GEN: Well nourished, well developed, in no acute distress HEENT: sclera anicteric Neck:  no JVD, carotid bruits, or masses Cardiac: RRR; no murmurs, rubs, or gallops, trace LE edema  Respiratory:  clear to auscultation bilaterally, normal  work of breathing GI: soft, nontender, nondistended, + BS MS: no deformity or atrophy Skin: warm and dry, no rash Neuro:  Strength and sensation are intact Psych: euthymic mood, full affect   EKG:  EKG is ordered today. The ekg ordered today demonstrates sinus rhythm with rate 60 bpm, non-specific IVCD (new mild widening from 06/2020), non-specific T wave abnormalities, no STE/D   Recent Labs: No results found for requested labs within last 8760 hours.    Lipid Panel    Component Value Date/Time   CHOL 135 12/20/2019 0839   TRIG 66 12/20/2019 0839   HDL 51 12/20/2019 0839   CHOLHDL 2.6 12/20/2019 0839   CHOLHDL 3 05/30/2011 0811   VLDL 17.2 05/30/2011 0811   LDLCALC 70 12/20/2019 0839      Wt Readings from Last 3 Encounters:  05/14/21 152 lb 6.4 oz (69.1 kg)  06/21/20 164 lb 12.8 oz (74.8 kg)  12/27/19 148 lb 12.8 oz (67.5 kg)      Other studies Reviewed: Additional studies/ records that were reviewed today include:   Echocardiogram 2015: Study Conclusions   - Left ventricle: The cavity size was normal. Wall thickness was    increased in a pattern of mild LVH. Systolic function was normal.    The estimated ejection fraction was in the range of 60% to 65%.  - Mitral valve: Poorly visualized in apical views. MAC wtih    restricted posterior leaflet motion and likely moderate MR.  - Left atrium: The atrium was mildly dilated.  - Pulmonary arteries: PA peak pressure: 39 mm Hg (S).  - Pericardium, extracardiac: Likely large epicardial fat pad and    small apical effusion Consider CT to further assess pericardial    space.  - Impressions: Overall image quality is poor with non diagnostic    apical views.   Impressions:   - Overall image quality is poor with non diagnostic apical views.   NST 2015: Impression Exercise Capacity:  Lexiscan with no exercise. BP Response:  Normal blood pressure response. Clinical Symptoms:  There is dyspnea. ECG Impression:   Uninterpretable due to baseline changes. Comparison with Prior Nuclear Study: Compared to 06/24/11, no significant change.   Overall Impression:  Normal stress nuclear study.   LV Wall Motion:  NL LV Function; NL Wall Motion   ASSESSMENT AND PLAN:  1. Anginal equivalent in patient with CAD s/p PCI to OM1 in 1990s and RCA in 2000: patient presents with an isolated episode of bilateral arm discomfort while gardening several weeks ago which resolved after ~1 hour and SL nitro. No recurrence since then. Symptoms reminiscent of prior MI prompting this evaluation. EKG with slight widening of her QRS complex - non-specific IVCD and non-specific T wave abnormalities but no STE/D. - Will update a NST to r/o ischemia given isolated episode.  - Shared Decision Making/Informed Consent{ The risks [chest pain, shortness of breath, cardiac arrhythmias, dizziness, blood pressure fluctuations, myocardial infarction, stroke/transient ischemic attack, nausea, vomiting, allergic reaction, radiation exposure, metallic taste sensation and life-threatening complications (estimated to be 1 in 10,000)], benefits (risk stratification, diagnosing coronary artery disease, treatment guidance) and alternatives of a nuclear stress test were discussed in detail with Ms. Newhouse and she agrees to proceed.  - Will update an echocardiogram to evaluate LV function, wall motion, and valve function - Continue aspirin and statin -  SL nitro rx renewed  2. HTN: BP 132/78 today - continue losartan and lasix  3. HLD: LDL 71 02/2021 - Continue rosuvastatin  4. Paroxysmal atrial fibrillation: isolated episode in 2015 in the setting of sepsis, therefore anticoagulation not recommended. No known recurrence. No complaints of palpitations - Continue to monitor for recurrent Afib  5. Mitral regurgitation: moderate on echo in 2015 - Will update an echocardiogram as above  6. Tobacco abuse: - Continue to encourage cessation   Current  medicines are reviewed at length with the patient today.  The patient does not have concerns regarding medicines.  The following changes have been made:  As above  Labs/ tests ordered today include:   Orders Placed This Encounter  Procedures   Cardiac Stress Test: Informed Consent Details: Physician/Practitioner Attestation; Transcribe to consent form and obtain patient signature   MYOCARDIAL PERFUSION IMAGING   EKG 12-Lead   ECHOCARDIOGRAM COMPLETE      Disposition:   FU with Dr. Swaziland as scheduled 08/2021   Signed, Beatriz Stallion, PA-C  05/14/2021 9:03 AM

## 2021-05-14 ENCOUNTER — Ambulatory Visit: Payer: Medicare Other | Admitting: Medical

## 2021-05-14 ENCOUNTER — Other Ambulatory Visit: Payer: Self-pay

## 2021-05-14 ENCOUNTER — Encounter: Payer: Self-pay | Admitting: Medical

## 2021-05-14 VITALS — BP 132/78 | HR 60 | Ht 61.5 in | Wt 152.4 lb

## 2021-05-14 DIAGNOSIS — I480 Paroxysmal atrial fibrillation: Secondary | ICD-10-CM

## 2021-05-14 DIAGNOSIS — E785 Hyperlipidemia, unspecified: Secondary | ICD-10-CM

## 2021-05-14 DIAGNOSIS — I10 Essential (primary) hypertension: Secondary | ICD-10-CM | POA: Diagnosis not present

## 2021-05-14 DIAGNOSIS — I340 Nonrheumatic mitral (valve) insufficiency: Secondary | ICD-10-CM

## 2021-05-14 DIAGNOSIS — Z720 Tobacco use: Secondary | ICD-10-CM | POA: Diagnosis not present

## 2021-05-14 DIAGNOSIS — I25118 Atherosclerotic heart disease of native coronary artery with other forms of angina pectoris: Secondary | ICD-10-CM | POA: Diagnosis not present

## 2021-05-14 DIAGNOSIS — I2510 Atherosclerotic heart disease of native coronary artery without angina pectoris: Secondary | ICD-10-CM

## 2021-05-14 MED ORDER — NITROGLYCERIN 0.4 MG SL SUBL: 0.4000 mg | SUBLINGUAL_TABLET | SUBLINGUAL | 3 refills | Status: DC | PRN

## 2021-05-14 NOTE — Patient Instructions (Signed)
Medication Instructions:  No Changes *If you need a refill on your cardiac medications before your next appointment, please call your pharmacy*   Lab Work: No Labs If you have labs (blood work) drawn today and your tests are completely normal, you will receive your results only by: [facility] (if you have MyChart) OR A paper copy in the mail If you have any lab test that is abnormal or we need to change your treatment, we will call you to review the results.   Testing/Procedures: [address], [place] has requested that you have an echocardiogram. Echocardiography is a painless test that uses sound waves to create images of your heart. It provides your doctor with information about the size and shape of your heart and how well your heart's chambers and valves are working. This procedure takes approximately one hour. There are no restrictions for this procedure.   [address], [address] Your physician has requested that you have a Lexicographer. For further information please visit [email or web address]. Please follow instruction sheet, as given.   Follow-Up: At [facility], you and your health needs are our priority.  As part of our continuing mission to provide you with exceptional heart care, we have created designated Provider Care Teams.  These Care Teams include your primary Cardiologist (physician) and Advanced Practice Providers (APPs -  Physician Assistants and Nurse Practitioners) who all work together to provide you with the care you need, when you need it.   Your next appointment:   August 23, 2021 8:20 AM  The format for your next appointment:   In Person  Provider:   Peter Martinique, MD

## 2021-05-27 DIAGNOSIS — M79641 Pain in right hand: Secondary | ICD-10-CM | POA: Diagnosis not present

## 2021-05-27 DIAGNOSIS — M25532 Pain in left wrist: Secondary | ICD-10-CM | POA: Diagnosis not present

## 2021-05-27 DIAGNOSIS — M65341 Trigger finger, right ring finger: Secondary | ICD-10-CM | POA: Diagnosis not present

## 2021-05-27 DIAGNOSIS — I7300 Raynaud's syndrome without gangrene: Secondary | ICD-10-CM | POA: Diagnosis not present

## 2021-05-28 DIAGNOSIS — I7300 Raynaud's syndrome without gangrene: Secondary | ICD-10-CM | POA: Insufficient documentation

## 2021-06-05 ENCOUNTER — Telehealth (HOSPITAL_COMMUNITY): Payer: Self-pay

## 2021-06-05 NOTE — Telephone Encounter (Signed)
Spoke with the patient's husband per the DPR. Detailed instructions were given. He stated that he understood and would be here for his test. Asked to call back with any questions. S.Dasani Thurlow EMTP

## 2021-06-06 ENCOUNTER — Other Ambulatory Visit: Payer: Self-pay

## 2021-06-06 ENCOUNTER — Ambulatory Visit (HOSPITAL_BASED_OUTPATIENT_CLINIC_OR_DEPARTMENT_OTHER): Payer: Medicare Other

## 2021-06-06 ENCOUNTER — Ambulatory Visit (HOSPITAL_COMMUNITY): Payer: Medicare Other | Attending: Cardiology

## 2021-06-06 DIAGNOSIS — I10 Essential (primary) hypertension: Secondary | ICD-10-CM

## 2021-06-06 DIAGNOSIS — I25118 Atherosclerotic heart disease of native coronary artery with other forms of angina pectoris: Secondary | ICD-10-CM

## 2021-06-06 DIAGNOSIS — I340 Nonrheumatic mitral (valve) insufficiency: Secondary | ICD-10-CM | POA: Insufficient documentation

## 2021-06-06 DIAGNOSIS — E785 Hyperlipidemia, unspecified: Secondary | ICD-10-CM | POA: Insufficient documentation

## 2021-06-06 DIAGNOSIS — I480 Paroxysmal atrial fibrillation: Secondary | ICD-10-CM | POA: Insufficient documentation

## 2021-06-06 DIAGNOSIS — Z720 Tobacco use: Secondary | ICD-10-CM | POA: Insufficient documentation

## 2021-06-06 LAB — MYOCARDIAL PERFUSION IMAGING
LV dias vol: 85 mL (ref 46–106)
LV sys vol: 35 mL
Nuc Stress EF: 58 %
Peak HR: 83 {beats}/min
Rest HR: 62 {beats}/min
Rest Nuclear Isotope Dose: 11 mCi
SDS: 5
SRS: 0
SSS: 5
ST Depression (mm): 0 mm
Stress Nuclear Isotope Dose: 30.7 mCi
TID: 0.98

## 2021-06-06 LAB — ECHOCARDIOGRAM COMPLETE
Area-P 1/2: 3.08 cm2
S' Lateral: 2.7 cm

## 2021-06-06 MED ORDER — TECHNETIUM TC 99M TETROFOSMIN IV KIT
30.7000 | PACK | Freq: Once | INTRAVENOUS | Status: AC | PRN
  Administered 2021-06-06: 30.7 via INTRAVENOUS
  Filled 2021-06-06: qty 31

## 2021-06-06 MED ORDER — REGADENOSON 0.4 MG/5ML IV SOLN
0.4000 mg | Freq: Once | INTRAVENOUS | Status: AC
  Administered 2021-06-06: 0.4 mg via INTRAVENOUS

## 2021-06-06 MED ORDER — TECHNETIUM TC 99M TETROFOSMIN IV KIT
11.0000 | PACK | Freq: Once | INTRAVENOUS | Status: AC | PRN
  Administered 2021-06-06: 11 via INTRAVENOUS
  Filled 2021-06-06: qty 11

## 2021-06-13 ENCOUNTER — Other Ambulatory Visit: Payer: Self-pay | Admitting: Cardiology

## 2021-06-24 DIAGNOSIS — M65341 Trigger finger, right ring finger: Secondary | ICD-10-CM | POA: Diagnosis not present

## 2021-06-24 DIAGNOSIS — C50911 Malignant neoplasm of unspecified site of right female breast: Secondary | ICD-10-CM | POA: Diagnosis not present

## 2021-07-01 DIAGNOSIS — G894 Chronic pain syndrome: Secondary | ICD-10-CM | POA: Diagnosis not present

## 2021-08-19 NOTE — Progress Notes (Signed)
Cardiology Office Note   Date:  08/23/2021   ID:  Abygale, Shofner 08/16/1946, MRN [id number]  PCP:  Ileana Ladd, MD  Cardiologist:  Chantry Headen [place], MD EP: None  Chief Complaint  Patient presents with   Coronary Artery Disease        History of Present Illness: [name] is a 75 y.o. female with a PMH of CAD s/p PCI to OM1 in 1990s and PCI to RCA in 2000, HTN, HLD, paroxysmal atrial fibrillation not on anticoagulation, breast cancer, diverticulum of the esophagus c/b fistula/perforation, COPD, and tobacco abuse, who presents for evaluation of recent bilateral arm pain.  Cardiac history dates back to the 1990s when she had an MI managed with PCI to OM1.  She then had another MI in 2000 and underwent PCI to RCA at that time.  Her last cardiac cath was in 2010 and looked good according to Dr. [place].  Her last ischemic evaluation was a nuclear stress test in 2015 which was low risk.  She had an echocardiogram in 2015 showing EF 60 to 65% with moderate MR.  She is noted to have an isolated episode of paroxysmal atrial fibrillation in 2015 in the setting of sepsis.  Anticoagulation was not felt to be needed going forward.  In addition to her cardiac history she has had spinal surgeries for management of cervical discitis and spinal stenosis.  She has also had bilateral hip replacements, one in 2016 for management of a dislocated hip, and again in 2020 for the right hip after a fall.  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  She had a gastrostomy tube placed.  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  She was last seen in October 2021 was doing well from a cardiac standpoint without any anginal complaints (anginal equivalent is shoulder pain), though unfortunately had resumed smoking.  No medication  changes occurred at that visit.  She was seen in August with recurrent arm pain. Bilateral. Myoview was done and was normal. Echo was also unremarkable with only mild MR.   On follow up today she is doing well from a cardiac standpoint. No chest pain or dyspnea. Still dealing with chronic back pain. Is smoking again. Had quit when she was sick with her esophageal problems but now resumed.      Past Medical History:  Diagnosis Date   Asthma    Breast cancer (HCC)    right breast   COPD (chronic obstructive pulmonary disease) (HCC)    Coronary artery disease    Diverticulum of esophagus    Elevated LFTs    Emphysema lung (HCC)    ETOH abuse    H/O atrial fibrillation without current medication    only one time when she had sepsis   Hyperlipidemia    Hypertension    hx of but not on any medications   Myocardial infarction (HCC) 2000   OA (osteoarthritis) of knee    Osteoarthritis    Tobacco abuse     Past Surgical History:  Procedure Laterality Date   ANTERIOR HIP REVISION Right 10/22/2015   Procedure: RIGHT  HIP REVISION;  Surgeon: Durene Romans, MD;  Location: [facility];  Service: Orthopedics;  Laterality: Right;   APPLICATION OF WOUND VAC N/A 06/20/2015   Procedure: APPLICATION OF INCISIONAL WOUND VAC;  Surgeon: Debria Garret  Shon Baton, MD;  Location: [facility];  Service: Orthopedics;  Laterality: N/A;   BREAST SURGERY  1991   right mastectomy   CARDIAC CATHETERIZATION  04/05/2009   EF 60%   CARDIOVASCULAR STRESS TEST  01/31/2005   EF 58%   CESAREAN SECTION  '78, '80, '81   x 3   CORONARY ANGIOPLASTY  08/1998   x2 OF A BIFURCATION OM-1, OM-2 LESION   CORONARY ANGIOPLASTY WITH STENT PLACEMENT  01/1999   MID FIRST OBTUSE MARGINAL VESSEL   CORONARY ANGIOPLASTY WITH STENT PLACEMENT  07/1999   STENTING AT THE CRUX OF THE RIGHT CORONARY ARTERY WITH A 3.8MM X TETRA STENT   DIRECT LARYNGOSCOPY N/A 05/03/2015   Procedure: DIRECT LARYNGOSCOPY;  Surgeon: Flo Shanks, MD;  Location: [facility] OR;   Service: ENT;  Laterality: N/A;   EYE SURGERY  05/18/2014,06/01/2014   BILATERAL CATARACT S WITH LENS IMPLANTS   GASTROSTOMY N/A 05/04/2015   Procedure: OPEN GASTROSTOMY WITH TUBE PLACEMENT;  Surgeon: Manus Rudd, MD;  [facility];  Service: General;  Laterality: N/A;   GASTROSTOMY N/A 11/13/2016   Procedure: INSERTION OF GASTROSTOMY TUBE;  Surgeon: Abigail Miyamoto, MD;  [facility]: [facility];  Service: General;  Laterality: N/A;   HARDWARE REMOVAL N/A 05/03/2015   Procedure: HARDWARE REMOVAL;  Surgeon: Venita Lick, MD;  [facility];  Service: Orthopedics;  Laterality: N/A;   HIP CLOSED REDUCTION Right 04/26/2015   Procedure: CLOSED REDUCTION HIP;  Surgeon: Venita Lick, MD;  Location: [facility];  Service: Orthopedics;  Laterality: Right;   HYSTEROSCOPY     D & C   INCISION AND DRAINAGE ABSCESS N/A 05/03/2015   Procedure: INCISION AND DRAINAGE CERVICAL  ABSCESS AND REMOVAL OF HARDWARE;  Surgeon: Venita Lick, MD;  [facility];  Service: Orthopedics;  Laterality: N/A;   IR CM INJ ANY COLONIC TUBE W/FLUORO  10/14/2017   IR CM INJ ANY COLONIC TUBE W/FLUORO  10/23/2017   IR CM INJ ANY COLONIC TUBE W/FLUORO  10/28/2017   IR REPLC GASTRO/COLONIC TUBE PERCUT W/FLUORO  09/22/2017   JOINT REPLACEMENT  08/2011   bilateral hip   JOINT REPLACEMENT  01/2012   right hip   MASTECTOMY     neck fusion  2011   ORIF FEMUR FRACTURE Right 06/14/2019   Procedure: ORIF PERI PROSTHETIC FEMUR FRACTURE;  Surgeon: Durene Romans, MD;  Location: [facility] OR;  Service: Orthopedics;  Laterality: Right;   PELVIC LAPAROSCOPY  2002   RSO-     RADICAL NECK DISSECTION N/A 11/08/2016   Procedure: INCISION AND DRAINAGE OF NECK ABSCESS;  Surgeon: Osborn Coho, MD;  Location[facility] OR;  Service: ENT;  Laterality: N/A;   RADIOLOGY WITH ANESTHESIA Right 06/28/2015   Procedure: MRI OF CERVICAL SPINE  AND RIGHT HIP  WITH AND WITHOUT CONTRAST    (RADIOLOGY WITH ANESTHESIA);  Surgeon: Medication Radiologist, MD;  [facility];  Service:  Radiology;  Laterality: Right;   REMOVAL OF GASTROSTOMY TUBE N/A 11/14/2016   Procedure: REMOVAL OF GASTROSTOMY TUBE W/ REPLACEMENT OF GASTROSTOMY TUBE;  Surgeon: Abigail Miyamoto, MD;  [facility];  Service: General;  Laterality: N/A;   RIGID ESOPHAGOSCOPY N/A 05/03/2015   Procedure: RIGID ESOPHAGOSCOPY;  Surgeon: Flo Shanks, MD;  Location: [facility] OR;  Service: ENT;  Laterality: N/A;   TONSILLECTOMY AND ADENOIDECTOMY     TOTAL HIP ARTHROPLASTY  08/2010   bilat   VULVECTOMY  1981   partial     Current Outpatient Medications  Medication Sig Dispense Refill  albuterol (PROVENTIL HFA;VENTOLIN HFA) 108 (90 BASE) MCG/ACT inhaler Inhale 2 puffs into the lungs every 6 (six) hours as needed for wheezing.      ALPRAZolam (XANAX) 0.25 MG tablet Take one tablet daily or as needed 90 tablet 3   aspirin EC 81 MG EC tablet Take 1 tablet (81 mg total) by mouth 2 (two) times daily. 60 tablet 0   budesonide-formoterol (SYMBICORT) 160-4.5 MCG/ACT inhaler Inhale 2 puffs into the lungs 2 (two) times daily as needed (Wheezing). Reported on 10/16/2015     diclofenac (VOLTAREN) 75 MG EC tablet Take 75 mg by mouth daily as needed for pain.     diclofenac sodium (VOLTAREN) 1 % GEL Apply 1 application topically 4 (four) times daily as needed for pain.     gabapentin (NEURONTIN) 600 MG tablet Take 600 mg by mouth 4 (four) times daily.      losartan (COZAAR) 50 MG tablet TAKE 1 TABLET BY MOUTH EVERY DAY 90 tablet 2   metFORMIN (GLUCOPHAGE-XR) 500 MG 24 hr tablet metformin ER 500 mg tablet,extended release 24 hr     methocarbamol (ROBAXIN) 500 MG tablet Take 500 mg by mouth daily as needed.     morphine (MS CONTIN) 30 MG 12 hr tablet Take 30 mg by mouth 4 (four) times daily.      nicotine (NICODERM CQ - DOSED IN MG/24 HOURS) 14 mg/24hr patch Place 1 patch (14 mg total) onto the skin daily. 28 patch 0   oxyCODONE (ROXICODONE) 15 MG immediate release tablet Take 15 mg by mouth 4 (four) times daily.     rosuvastatin  (CRESTOR) 10 MG tablet TAKE 1 TABLET BY MOUTH EVERY DAY 90 tablet 2   furosemide (LASIX) 20 MG tablet Take 1 tablet (20 mg total) by mouth daily as needed for edema. 90 tablet 3   nitroGLYCERIN (NITROSTAT) 0.4 MG SL tablet Place 1 tablet (0.4 mg total) under the tongue every 5 (five) minutes as needed for chest pain. 25 tablet 3   No current facility-administered medications for this visit.    Allergies:   Azithromycin, Cefuroxime, Cephalexin, Chlorhexidine, Prednisone, and Zithromax [azithromycin dihydrate]    Social History:  The patient  reports that she has quit smoking. Her smoking use included cigarettes. She has a 50.00 pack-year smoking history. She has never used smokeless tobacco. She reports that she does not drink alcohol and does not use drugs.   Family History:  The patient's family history includes Diabetes in her mother; Heart attack in her father; Heart disease in her father; Hypertension in her father; Stroke in her father.    ROS:  Please see the history of present illness.   Otherwise, review of systems are positive for none.   All other systems are reviewed and negative.    PHYSICAL EXAM: VS:  BP 120/70 (BP Location: Left Arm)   Pulse 84   Ht 5\' 1"  (1.549 m)   Wt 148 lb 3.2 oz (67.2 kg)   SpO2 94%   BMI 28.00 kg/m  , BMI Body mass index is 28 kg/m. GEN: Well nourished, well developed, in no acute distress HEENT: sclera anicteric Neck: no JVD, carotid bruits, or masses Cardiac: RRR; no murmurs, rubs, or gallops, trace LE edema  Respiratory:  clear to auscultation bilaterally, normal work of breathing GI: soft, nontender, nondistended, + BS MS: no deformity or atrophy Skin: warm and dry, no rash Neuro:  Strength and sensation are intact Psych: euthymic mood, full affect   EKG:  EKG is not ordered today.   Recent Labs: No results found for requested labs within last 8760 hours.    Lipid Panel    Component Value Date/Time   CHOL 135 12/20/2019 0839    TRIG 66 12/20/2019 0839   HDL 51 12/20/2019 0839   CHOLHDL 2.6 12/20/2019 0839   CHOLHDL 3 05/30/2011 0811   VLDL 17.2 05/30/2011 0811   LDLCALC 70 12/20/2019 0839    Dated 08/20/21: cholesterol 144, triglycerides 69, HDL 58, LDL 73. A1c 6.6%. glucose 143. Otherwise CMET normal.  Wt Readings from Last 3 Encounters:  08/23/21 148 lb 3.2 oz (67.2 kg)  06/06/21 152 lb (68.9 kg)  05/14/21 152 lb 6.4 oz (69.1 kg)      Other studies Reviewed: Additional studies/ records that were reviewed today include:   Echocardiogram 2015: Study Conclusions   - Left ventricle: The cavity size was normal. Wall thickness was    increased in a pattern of mild LVH. Systolic function was normal.    The estimated ejection fraction was in the range of 60% to 65%.  - Mitral valve: Poorly visualized in apical views. MAC wtih    restricted posterior leaflet motion and likely moderate MR.  - Left atrium: The atrium was mildly dilated.  - Pulmonary arteries: PA peak pressure: 39 mm Hg (S).  - Pericardium, extracardiac: Likely large epicardial fat pad and    small apical effusion Consider CT to further assess pericardial    space.  - Impressions: Overall image quality is poor with non diagnostic    apical views.   Impressions:   - Overall image quality is poor with non diagnostic apical views.   NST 2015: Impression Exercise Capacity:  Lexiscan with no exercise. BP Response:  Normal blood pressure response. Clinical Symptoms:  There is dyspnea. ECG Impression:  Uninterpretable due to baseline changes. Comparison with Prior Nuclear Study: Compared to 06/24/11, no significant change.   Overall Impression:  Normal stress nuclear study.   LV Wall Motion:  NL LV Function; NL Wall Motion  Myoview 06/06/21: Study Highlights      The study is normal. The study is low risk.   No ST deviation was noted.   Left ventricular function is normal. Nuclear stress EF: 58 %. The left ventricular ejection fraction  is normal (55-65%). End diastolic cavity size is normal.   Prior study available for comparison from 07/27/2014.   Low risk stress nuclear study with normal perfusion and normal left ventricular regional and global systolic function.   Echo 06/06/21: IMPRESSIONS     1. Left ventricular ejection fraction, by estimation, is 65 to 70%. The  left ventricle has normal function. The left ventricle has no regional  wall motion abnormalities. Left ventricular diastolic parameters are  indeterminate.   2. Right ventricular systolic function is normal. The right ventricular  size is normal. There is normal pulmonary artery systolic pressure.   3. Left atrial size was moderately dilated.   4. Right atrial size was moderately dilated.   5. The mitral valve was not well visualized. Mild mitral valve  regurgitation. No evidence of mitral stenosis. Moderate to severe mitral  annular calcification.   6. The aortic valve is tricuspid. Aortic valve regurgitation is not  visualized. No aortic stenosis is present.   7. The inferior vena cava is normal in size with greater than 50%  respiratory variability, suggesting right atrial pressure of 3 mmHg.   Comparison(s): Mitral valve not well visualized, but MR appears  mild  (prior moderate).    ASSESSMENT AND PLAN:  1. CAD s/p PCI to OM1 in 1990s and RCA in 2000:  - normal Myoview in September 2021 - no active angina  2. HTN: BP is well controlled.  - continue current medication.   3. HLD: LDL 73  - Continue rosuvastatin  4. Paroxysmal atrial fibrillation: isolated episode in 2015 in the setting of sepsis, therefore anticoagulation not recommended. No known recurrence.   5. Mitral regurgitation: moderate on echo in 2015 - repeat Echo in August with only mild MR  6. Tobacco abuse: - recommend smoking cessation   Current medicines are reviewed at length with the patient today.  The patient does not have concerns regarding medicines.  The  following changes have been made:  As above  Labs/ tests ordered today include:   No orders of the defined types were placed in this encounter.   Disposition:   FU 6 months  Signed, Enrigue Hashimi [place], MD  08/23/2021 8:47 AM

## 2021-08-20 DIAGNOSIS — I10 Essential (primary) hypertension: Secondary | ICD-10-CM | POA: Diagnosis not present

## 2021-08-20 DIAGNOSIS — Z8709 Personal history of other diseases of the respiratory system: Secondary | ICD-10-CM | POA: Diagnosis not present

## 2021-08-20 DIAGNOSIS — F17200 Nicotine dependence, unspecified, uncomplicated: Secondary | ICD-10-CM | POA: Diagnosis not present

## 2021-08-20 DIAGNOSIS — M4155 Other secondary scoliosis, thoracolumbar region: Secondary | ICD-10-CM | POA: Diagnosis not present

## 2021-08-20 DIAGNOSIS — I700 Atherosclerosis of aorta: Secondary | ICD-10-CM | POA: Diagnosis not present

## 2021-08-20 DIAGNOSIS — E1169 Type 2 diabetes mellitus with other specified complication: Secondary | ICD-10-CM | POA: Diagnosis not present

## 2021-08-20 DIAGNOSIS — J439 Emphysema, unspecified: Secondary | ICD-10-CM | POA: Diagnosis not present

## 2021-08-20 DIAGNOSIS — Z0000 Encounter for general adult medical examination without abnormal findings: Secondary | ICD-10-CM | POA: Diagnosis not present

## 2021-08-20 DIAGNOSIS — M4015 Other secondary kyphosis, thoracolumbar region: Secondary | ICD-10-CM | POA: Diagnosis not present

## 2021-08-23 ENCOUNTER — Encounter: Payer: Self-pay | Admitting: Cardiology

## 2021-08-23 ENCOUNTER — Ambulatory Visit: Payer: Medicare Other | Admitting: Cardiology

## 2021-08-23 ENCOUNTER — Other Ambulatory Visit: Payer: Self-pay

## 2021-08-23 VITALS — BP 120/70 | HR 84 | Ht 61.0 in | Wt 148.2 lb

## 2021-08-23 DIAGNOSIS — I25118 Atherosclerotic heart disease of native coronary artery with other forms of angina pectoris: Secondary | ICD-10-CM

## 2021-08-23 DIAGNOSIS — E785 Hyperlipidemia, unspecified: Secondary | ICD-10-CM

## 2021-08-23 DIAGNOSIS — I10 Essential (primary) hypertension: Secondary | ICD-10-CM | POA: Diagnosis not present

## 2021-08-23 DIAGNOSIS — Z720 Tobacco use: Secondary | ICD-10-CM | POA: Diagnosis not present

## 2021-08-26 ENCOUNTER — Other Ambulatory Visit: Payer: Self-pay | Admitting: Cardiology

## 2021-10-01 DIAGNOSIS — M5459 Other low back pain: Secondary | ICD-10-CM | POA: Diagnosis not present

## 2021-11-18 ENCOUNTER — Ambulatory Visit: Payer: Self-pay | Admitting: Podiatry

## 2021-11-19 ENCOUNTER — Ambulatory Visit: Payer: Medicare Other | Admitting: Podiatry

## 2021-11-19 ENCOUNTER — Ambulatory Visit (INDEPENDENT_AMBULATORY_CARE_PROVIDER_SITE_OTHER): Payer: Medicare Other

## 2021-11-19 ENCOUNTER — Encounter: Payer: Self-pay | Admitting: Podiatry

## 2021-11-19 ENCOUNTER — Other Ambulatory Visit: Payer: Self-pay

## 2021-11-19 DIAGNOSIS — M2042 Other hammer toe(s) (acquired), left foot: Secondary | ICD-10-CM | POA: Diagnosis not present

## 2021-11-19 DIAGNOSIS — R0989 Other specified symptoms and signs involving the circulatory and respiratory systems: Secondary | ICD-10-CM | POA: Diagnosis not present

## 2021-11-19 DIAGNOSIS — M2040 Other hammer toe(s) (acquired), unspecified foot: Secondary | ICD-10-CM

## 2021-11-19 DIAGNOSIS — G8918 Other acute postprocedural pain: Secondary | ICD-10-CM | POA: Insufficient documentation

## 2021-11-19 DIAGNOSIS — M2041 Other hammer toe(s) (acquired), right foot: Secondary | ICD-10-CM

## 2021-11-19 DIAGNOSIS — B351 Tinea unguium: Secondary | ICD-10-CM | POA: Diagnosis not present

## 2021-11-19 DIAGNOSIS — Z9889 Other specified postprocedural states: Secondary | ICD-10-CM | POA: Insufficient documentation

## 2021-11-19 DIAGNOSIS — M79676 Pain in unspecified toe(s): Secondary | ICD-10-CM

## 2021-11-19 DIAGNOSIS — Z79891 Long term (current) use of opiate analgesic: Secondary | ICD-10-CM | POA: Insufficient documentation

## 2021-11-19 DIAGNOSIS — D2371 Other benign neoplasm of skin of right lower limb, including hip: Secondary | ICD-10-CM | POA: Diagnosis not present

## 2021-11-19 DIAGNOSIS — D2372 Other benign neoplasm of skin of left lower limb, including hip: Secondary | ICD-10-CM | POA: Diagnosis not present

## 2021-11-19 NOTE — Progress Notes (Signed)
Subjective:  Patient ID: [name], female    DOB: 1946/04/05,  MRN: [id number] HPI Chief Complaint  Patient presents with   Toe Pain    2nd toe right and 3rd, 4th, 5th toes right - hammertoe deformity x years, callused at the tips, curled under and walking on the toe on the left, very painful at times   New Patient (Initial Visit)    76 y.o. female presents with the above complaint.   ROS: Denies fever chills nausea vomiting muscle aches pains calf pain back pain chest pain shortness of breath.  Past Medical History:  Diagnosis Date   Asthma    Breast cancer (HCC)    right breast   COPD (chronic obstructive pulmonary disease) (HCC)    Coronary artery disease    Diverticulum of esophagus    Elevated LFTs    Emphysema lung (HCC)    ETOH abuse    H/O atrial fibrillation without current medication    only one time when she had sepsis   Hyperlipidemia    Hypertension    hx of but not on any medications   Myocardial infarction (HCC) 2000   OA (osteoarthritis) of knee    Osteoarthritis    Tobacco abuse    Past Surgical History:  Procedure Laterality Date   ANTERIOR HIP REVISION Right 10/22/2015   Procedure: RIGHT  HIP REVISION;  Surgeon: Durene Romans, MD;  Location: [facility];  Service: Orthopedics;  Laterality: Right;   APPLICATION OF WOUND VAC N/A 06/20/2015   Procedure: APPLICATION OF INCISIONAL WOUND VAC;  Surgeon: Venita Lick, MD;  Location: [facility];  Service: Orthopedics;  Laterality: N/A;   BREAST SURGERY  1991   right mastectomy   CARDIAC CATHETERIZATION  04/05/2009   EF 60%   CARDIOVASCULAR STRESS TEST  01/31/2005   EF 58%   CESAREAN SECTION  '78, '80, '81   x 3   CORONARY ANGIOPLASTY  08/1998   x2 OF A BIFURCATION OM-1, OM-2 LESION   CORONARY ANGIOPLASTY WITH STENT PLACEMENT  01/1999   MID FIRST OBTUSE MARGINAL VESSEL   CORONARY ANGIOPLASTY WITH STENT PLACEMENT  07/1999   STENTING AT THE CRUX OF THE RIGHT CORONARY ARTERY WITH A 3.8MM X TETRA STENT   DIRECT  LARYNGOSCOPY N/A 05/03/2015   Procedure: DIRECT LARYNGOSCOPY;  Surgeon: Flo Shanks, MD;  Location: [facility] OR;  Service: ENT;  Laterality: N/A;   EYE SURGERY  05/18/2014,06/01/2014   BILATERAL CATARACT S WITH LENS IMPLANTS   GASTROSTOMY N/A 05/04/2015   Procedure: OPEN GASTROSTOMY WITH TUBE PLACEMENT;  Surgeon: Manus Rudd, MD;  [facility];  Service: General;  Laterality: N/A;   GASTROSTOMY N/A 11/13/2016   Procedure: INSERTION OF GASTROSTOMY TUBE;  Surgeon: Abigail Miyamoto, MD;  Location: [facility];  Service: General;  Laterality: N/A;   HARDWARE REMOVAL N/A 05/03/2015   Procedure: HARDWARE REMOVAL;  Surgeon: Venita Lick, MD;  Location: [facility];  Service: Orthopedics;  Laterality: N/A;   HIP CLOSED REDUCTION Right 04/26/2015   Procedure: CLOSED REDUCTION HIP;  Surgeon: Venita Lick, MD;  Location: [facility];  Service: Orthopedics;  Laterality: Right;   HYSTEROSCOPY     D & C   INCISION AND DRAINAGE ABSCESS N/A 05/03/2015   Procedure: INCISION AND DRAINAGE CERVICAL  ABSCESS AND REMOVAL OF HARDWARE;  Surgeon: Venita Lick, MD;  Location: [facility];  Service: Orthopedics;  Laterality: N/A;   IR CM INJ ANY COLONIC TUBE W/FLUORO  10/14/2017   IR CM INJ ANY COLONIC TUBE W/FLUORO  10/23/2017   IR CM INJ ANY COLONIC TUBE W/FLUORO  10/28/2017   IR REPLC GASTRO/COLONIC TUBE PERCUT W/FLUORO  09/22/2017   JOINT REPLACEMENT  08/2011   bilateral hip   JOINT REPLACEMENT  01/2012   right hip   MASTECTOMY     neck fusion  2011   ORIF FEMUR FRACTURE Right 06/14/2019   Procedure: ORIF PERI PROSTHETIC FEMUR FRACTURE;  Surgeon: Durene Romans, MD;  Location: [facility] OR;  Service: Orthopedics;  Laterality: Right;   PELVIC LAPAROSCOPY  2002   RSO-     RADICAL NECK DISSECTION N/A 11/08/2016   Procedure: INCISION AND DRAINAGE OF NECK ABSCESS;  Surgeon: Osborn Coho, MD;  Location: [facility] OR;  Service: ENT;  Laterality: N/A;   RADIOLOGY WITH ANESTHESIA Right 06/28/2015   Procedure: MRI OF CERVICAL SPINE  AND RIGHT HIP  WITH AND  WITHOUT CONTRAST    (RADIOLOGY WITH ANESTHESIA);  Surgeon: Medication Radiologist, MD;  Location: [facility];  Service: Radiology;  Laterality: Right;   REMOVAL OF GASTROSTOMY TUBE N/A 11/14/2016   Procedure: REMOVAL OF GASTROSTOMY TUBE W/ REPLACEMENT OF GASTROSTOMY TUBE;  Surgeon: Abigail Miyamoto, MD;  Location: [facility];  Service: General;  Laterality: N/A;   RIGID ESOPHAGOSCOPY N/A 05/03/2015   Procedure: RIGID ESOPHAGOSCOPY;  Surgeon: Flo Shanks, MD;  Location: [facility] OR;  Service: ENT;  Laterality: N/A;   TONSILLECTOMY AND ADENOIDECTOMY     TOTAL HIP ARTHROPLASTY  08/2010   bilat   VULVECTOMY  1981   partial    Current Outpatient Medications:    furosemide (LASIX) 20 MG tablet, Take 1 tablet (20 mg total) by mouth daily as needed for edema., Disp: 90 tablet, Rfl: 3   albuterol (PROVENTIL HFA;VENTOLIN HFA) 108 (90 BASE) MCG/ACT inhaler, Inhale 2 puffs into the lungs every 6 (six) hours as needed for wheezing. , Disp: , Rfl:    aspirin EC 81 MG EC tablet, Take 1 tablet (81 mg total) by mouth 2 (two) times daily., Disp: 60 tablet, Rfl: 0   diclofenac sodium (VOLTAREN) 1 % GEL, Apply 1 application topically 4 (four) times daily as needed for pain., Disp: , Rfl:    gabapentin (NEURONTIN) 600 MG tablet, Take 600 mg by mouth 4 (four) times daily. , Disp: , Rfl:    losartan (COZAAR) 50 MG tablet, TAKE 1 TABLET BY MOUTH EVERY DAY, Disp: 90 tablet, Rfl: 2   metFORMIN (GLUCOPHAGE-XR) 500 MG 24 hr tablet, metformin ER 500 mg tablet,extended release 24 hr, Disp: , Rfl:    methocarbamol (ROBAXIN) 500 MG tablet, Take 500 mg by mouth daily as needed., Disp: , Rfl:    morphine (MS CONTIN) 30 MG 12 hr tablet, Take 30 mg by mouth 4 (four) times daily. , Disp: , Rfl:    nicotine (NICODERM CQ - DOSED IN MG/24 HOURS) 14 mg/24hr patch, Place 1 patch (14 mg total) onto the skin daily., Disp: 28 patch, Rfl: 0   oxyCODONE (ROXICODONE) 15 MG immediate release tablet, Take 15 mg by mouth 4 (four) times daily., Disp: , Rfl:     rosuvastatin (CRESTOR) 10 MG tablet, TAKE 1 TABLET BY MOUTH EVERY DAY, Disp: 90 tablet, Rfl: 2  Allergies  Allergen Reactions   Azithromycin Other (See Comments)   Cefuroxime Other (See Comments)   Cephalexin Other (See Comments)    Took off first layer of skin inside of mouth   Chlorhexidine    Prednisone    Zithromax [Azithromycin Dihydrate] Other (See Comments)    ORAL ULCERS  Review of Systems Objective:  There were no vitals filed for this visit.  General: Well developed, nourished, in no acute distress, alert and oriented x3   Dermatological: Skin is warm, dry and supple bilateral. Nails x 10 are well maintained; remaining integument appears unremarkable at this time. There are no open sores, no preulcerative lesions, no rash or signs of infection present.  She has distal clavi to toes #3 #4 of the left foot.  And toenails are thick yellow and dystrophic.  Vascular: Dorsalis Pedis artery and Posterior Tibial artery pedal pulses are 0/4 bilateral with delayed capillary fill time. Pedal hair growth present. No varicosities and no lower extremity edema present bilateral.   Neruologic: Grossly intact via light touch bilateral. Vibratory intact via tuning fork bilateral. Protective threshold with Semmes Wienstein monofilament intact to all pedal sites bilateral. Patellar and Achilles deep tendon reflexes 2+ bilateral. No Babinski or clonus noted bilateral.   Musculoskeletal: No gross boney pedal deformities bilateral. No pain, crepitus, or limitation noted with foot and ankle range of motion bilateral. Muscular strength 5/5 in all groups tested bilateral.  Adductovarus rotation toes 3 and 4 left foot with a dorsally contracted second metatarsal phalangeal joint.  She does have mild hallux valgus deformity hammertoe deformity second right.  Gait: Unassisted, Nonantalgic.    Radiographs:  Radiographs taken today demonstrate an osseously mature individual with severe osteopenia  adductovarus rotation of the fourth and fifth toes of the left foot elevated second toe left foot with fusion at the PIPJ.  Hallux valgus deformity on the right foot and hammertoe deformity second right. Assessment & Plan:   Assessment: Peripheral vascular disease history of smoking and Raynaud's disease hammertoe deformities with distal clavi and painful elongated toenails  Plan: Debridement of toenails and debridement of benign skin lesions.  I am sending her for vascular studies to even consider surgical intervention     Alenah Sarria T. [place], [place]

## 2021-11-21 ENCOUNTER — Ambulatory Visit (HOSPITAL_COMMUNITY)
Admission: RE | Admit: 2021-11-21 | Discharge: 2021-11-21 | Disposition: A | Discharge status: Home / Self Care | Payer: Medicare Other | Source: Ambulatory Visit | Attending: Podiatry | Admitting: Podiatry

## 2021-11-21 ENCOUNTER — Other Ambulatory Visit: Payer: Self-pay

## 2021-11-21 DIAGNOSIS — R0989 Other specified symptoms and signs involving the circulatory and respiratory systems: Secondary | ICD-10-CM | POA: Diagnosis not present

## 2021-11-28 ENCOUNTER — Telehealth: Payer: Self-pay | Admitting: Podiatry

## 2021-11-28 NOTE — Telephone Encounter (Signed)
Patient called wanting to know what the next steps are she had the vascular study done and she is having a lot of pain in her feet and toes.  Please advise

## 2021-12-10 ENCOUNTER — Ambulatory Visit: Payer: Medicare Other | Admitting: Podiatry

## 2021-12-10 ENCOUNTER — Encounter: Payer: Self-pay | Admitting: Podiatry

## 2021-12-10 ENCOUNTER — Other Ambulatory Visit: Payer: Self-pay

## 2021-12-10 DIAGNOSIS — M2042 Other hammer toe(s) (acquired), left foot: Secondary | ICD-10-CM | POA: Diagnosis not present

## 2021-12-10 NOTE — Progress Notes (Signed)
She presents today to discuss her vascular findings as well as what can be done about her hammertoe deformities of her bilateral feet.  She has previously had her hammertoes corrected #2 bilaterally and they stick up.  She states that she would like to have those release so that they can sit down.  She says these need to be straightened as she refers to the third and fourth toes on the left foot. ? ?Objective: Vital signs are stable alert oriented x3 pulses remain minimally palpable capillary fill time is immediate vascular studies do demonstrate that time she is got good blood flow to the level of the hallux hallux on the right side does have a limited blood flow and this is where she has her bunion deformity.  She does have rigid hammertoe deformities #4 #3 of the left foot with distal clavi which is painful.  She has contracted second metatarsal phalangeal joints bilaterally. ? ?Radiographs were reviewed from previous evaluation. ? 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 ? ?Plan: Discussed etiology pathology and surgical therapies at this point in time I expressed to her that most likely we would not be able to get the toes completely straight because of their chronicity she understands and is amenable to it she also understands that she has a bunion deformity on the right foot were were not able to work on because of her vascular status.  But we do consent her today for release of the second metatarsal phalangeal joint bilaterally as well as hammertoe repair third and 4 with pins on the left.  She understands this is amenable to it we discussed this in great detail the pros and cons of the surgery which may include but not limited to postop pain bleeding swell infection recurrence need for further surgery overcorrection under correction also digit loss limb loss of life.  She signed out the patient's consent form and follow-up with  her in the near future for surgical intervention. ?

## 2021-12-11 ENCOUNTER — Telehealth: Payer: Self-pay | Admitting: Urology

## 2021-12-11 NOTE — Telephone Encounter (Signed)
DOS - 12/27/21 ? ?HAMMERTOE REPAIR 3,4 LEFT --- 84210 ?CAPSULOTOMY 2ND BILAT --- 31281 ? ?[employer] EFFECTIVE DATE - 09/15/21 ? ?PLAN DEDUCTIBLE - $0.00 ?OUT OF POCKET - $3,600.00 W/ $3,450.00 REMAINING ?COINSURANCE - 0% ?COPAY - $295.00 ? ? ? ?PER [employer] WEBSITE FOR CPT CODES 18867 AND 73736 Notification or Prior Authorization is not required for the requested services ? ?This [employer] Advantage members plan does not currently require a prior authorization for these services. If you have general questions about the prior authorization requirements, please call us at [phone] or visit [email or web address] > Clinician Resources > Advance and Admission Notification Requirements. The number above acknowledges your notification. Please write this number down for future reference. Notification is not a guarantee of coverage or payment. ? ?Decision ID #:[id number] ?

## 2021-12-25 ENCOUNTER — Other Ambulatory Visit: Payer: Self-pay | Admitting: Podiatry

## 2021-12-25 MED ORDER — ONDANSETRON HCL 4 MG PO TABS: 4.0000 mg | ORAL_TABLET | Freq: Three times a day (TID) | ORAL | 0 refills | Status: DC | PRN

## 2021-12-25 MED ORDER — HYDROCODONE-ACETAMINOPHEN 10-325 MG PO TABS: 1.0000 | ORAL_TABLET | Freq: Four times a day (QID) | ORAL | 0 refills | Status: AC | PRN

## 2021-12-25 MED ORDER — DOXYCYCLINE HYCLATE 100 MG PO TABS: 100.0000 mg | ORAL_TABLET | Freq: Two times a day (BID) | ORAL | 0 refills | Status: DC

## 2021-12-27 DIAGNOSIS — M24575 Contracture, left foot: Secondary | ICD-10-CM | POA: Diagnosis not present

## 2021-12-27 DIAGNOSIS — M2042 Other hammer toe(s) (acquired), left foot: Secondary | ICD-10-CM

## 2021-12-27 DIAGNOSIS — M2041 Other hammer toe(s) (acquired), right foot: Secondary | ICD-10-CM

## 2022-01-02 ENCOUNTER — Ambulatory Visit (INDEPENDENT_AMBULATORY_CARE_PROVIDER_SITE_OTHER): Payer: Medicare Other | Admitting: Podiatry

## 2022-01-02 ENCOUNTER — Ambulatory Visit (INDEPENDENT_AMBULATORY_CARE_PROVIDER_SITE_OTHER): Payer: Medicare Other

## 2022-01-02 ENCOUNTER — Encounter: Payer: Self-pay | Admitting: Podiatry

## 2022-01-02 DIAGNOSIS — Z9889 Other specified postprocedural states: Secondary | ICD-10-CM

## 2022-01-02 DIAGNOSIS — M24576 Contracture, unspecified foot: Secondary | ICD-10-CM

## 2022-01-02 DIAGNOSIS — M2042 Other hammer toe(s) (acquired), left foot: Secondary | ICD-10-CM | POA: Diagnosis not present

## 2022-01-02 MED ORDER — HYDROCODONE-ACETAMINOPHEN 10-325 MG PO TABS: 1.0000 | ORAL_TABLET | Freq: Four times a day (QID) | ORAL | 0 refills | Status: AC | PRN

## 2022-01-02 NOTE — Progress Notes (Signed)
She presents today for postop visit date of surgery 12/27/2021 release of sec metatarsophalangeal joint bilateral also hammertoe repair with K wires #3 #4 of the left foot.  She states that seems to be doing all right but I needs more pain medicine.  She states that she finished her antibiotic. ? ?Objective: Vital signs are stable she is alert and oriented x3.  Pulses are palpable.  Sterile dressing was intact bilaterally once removed demonstrates sutures are intact margins well coapted overlying erythema distal aspect of the left foot.  Does not appear to be cellulitic in nature.  No purulence no malodor nontender. ? ?Radiographs taken today demonstrate K wires in place to the toes that she does have somewhat of the separation between the middle and the proximal phalanx of the fourth toe left foot which does not surprise me concerned we had to remove a large amount of bone. ? ?Assessment: Well-healing surgical foot. ? ?Plan: Redressed today dressed a compressive dressing follow-up with her in 1 week make sure she is doing well.  May consider removing stitches to the right foot but I do not think we can remove the ones on the left yet. ?

## 2022-01-09 ENCOUNTER — Encounter: Payer: Self-pay | Admitting: Podiatry

## 2022-01-09 ENCOUNTER — Ambulatory Visit (INDEPENDENT_AMBULATORY_CARE_PROVIDER_SITE_OTHER): Payer: Medicare Other | Admitting: Podiatry

## 2022-01-09 DIAGNOSIS — Z9889 Other specified postprocedural states: Secondary | ICD-10-CM

## 2022-01-09 DIAGNOSIS — M24576 Contracture, unspecified foot: Secondary | ICD-10-CM

## 2022-01-09 DIAGNOSIS — M2042 Other hammer toe(s) (acquired), left foot: Secondary | ICD-10-CM

## 2022-01-09 NOTE — Progress Notes (Signed)
She presents today date of surgery 12/27/2021 release of the second metatarsal phalangeal joint right and left and hammertoe repair to toes #3 and #4 of the left foot with pins.  She denies fever chills nausea vomit muscle aches and pains. ? ?Objective: Vital signs stable oriented x3 K wires are intact to toes 3 and 4 of the left foot.  Sutures are intact to the toes but there do not appear to be completely healed.  The other sutures at the tenotomy sites have gone on to heal uneventfully I removed those today. ? ?Assessment: Well-healing surgical foot. ? ?Plan: Sutures removed on the right foot she can use her shoe on left foot and her left foot she is going to continue the use of her Darco shoe to help protect the pin site dry sterile compressive dressing was applied.  Follow-up with her in 1 week for suture removal by the nurse and I will follow-up with her in about 2 weeks. ?

## 2022-01-10 ENCOUNTER — Telehealth: Payer: Self-pay | Admitting: *Deleted

## 2022-01-10 ENCOUNTER — Other Ambulatory Visit: Payer: Self-pay | Admitting: Podiatry

## 2022-01-10 MED ORDER — HYDROCODONE-ACETAMINOPHEN 5-325 MG PO TABS: 1.0000 | ORAL_TABLET | Freq: Four times a day (QID) | ORAL | 0 refills | Status: DC | PRN

## 2022-01-10 MED ORDER — HYDROCODONE-ACETAMINOPHEN 10-325 MG PO TABS: 1.0000 | ORAL_TABLET | Freq: Three times a day (TID) | ORAL | 0 refills | Status: AC | PRN

## 2022-01-10 NOTE — Telephone Encounter (Signed)
Patient is requesting a refill of pain medicine (hydrocodone-ace),half the quantity as last time given.Please advise ?

## 2022-01-13 NOTE — Telephone Encounter (Signed)
Patient notified

## 2022-01-16 ENCOUNTER — Ambulatory Visit: Payer: Medicare Other

## 2022-01-16 DIAGNOSIS — M2042 Other hammer toe(s) (acquired), left foot: Secondary | ICD-10-CM

## 2022-01-16 NOTE — Progress Notes (Signed)
Patient in office today for suture removal from left 3rd and 4th toes. Suture removed without complication. Patient denies nausea, vomiting, fever and chills. Left foot dressed with dry sterile compressive dressing. Darco shoe used to protect the pins in place. Advised to keep appointment with Dr. Milinda Pointer for pin removal.  ?

## 2022-01-23 ENCOUNTER — Ambulatory Visit (INDEPENDENT_AMBULATORY_CARE_PROVIDER_SITE_OTHER): Payer: Medicare Other | Admitting: Podiatry

## 2022-01-23 ENCOUNTER — Encounter: Payer: Self-pay | Admitting: Podiatry

## 2022-01-23 ENCOUNTER — Ambulatory Visit (INDEPENDENT_AMBULATORY_CARE_PROVIDER_SITE_OTHER): Payer: Medicare Other

## 2022-01-23 DIAGNOSIS — M2040 Other hammer toe(s) (acquired), unspecified foot: Secondary | ICD-10-CM

## 2022-01-23 DIAGNOSIS — M2042 Other hammer toe(s) (acquired), left foot: Secondary | ICD-10-CM | POA: Diagnosis not present

## 2022-01-26 NOTE — Progress Notes (Signed)
She presents today for postop visit date of surgery 12/27/2021.  Release of the second MTP joint bilaterally with a hammertoe repair #3 #4 left foot.  States that is feeling fine she states that she has been out in her garden working on keeping it covered with a Designer, television/film set. ? ?Objective: Vital signs are stable alert oriented x3 there is no erythema edema cellulitis drainage or odor.  Pulses are palpable.  K wires are intact to toes #3 and #4 of the left foot.  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  K wires are intact but due to the activity level I just do not see that this is going to heal. ? ?Assessment: Rectus toes well-healing surgical foot. ? ?Plan: I am going to follow-up with her in about 2 weeks for another set of x-rays may consider removing the K wires at that point.  After ?

## 2022-02-13 ENCOUNTER — Encounter: Payer: Medicare Other | Admitting: Podiatry

## 2022-02-14 DIAGNOSIS — M5412 Radiculopathy, cervical region: Secondary | ICD-10-CM | POA: Diagnosis not present

## 2022-02-14 DIAGNOSIS — G894 Chronic pain syndrome: Secondary | ICD-10-CM | POA: Diagnosis not present

## 2022-02-14 DIAGNOSIS — Z79891 Long term (current) use of opiate analgesic: Secondary | ICD-10-CM | POA: Diagnosis not present

## 2022-02-18 ENCOUNTER — Ambulatory Visit (INDEPENDENT_AMBULATORY_CARE_PROVIDER_SITE_OTHER): Payer: Medicare Other

## 2022-02-18 ENCOUNTER — Ambulatory Visit (INDEPENDENT_AMBULATORY_CARE_PROVIDER_SITE_OTHER): Payer: Medicare Other | Admitting: Podiatry

## 2022-02-18 ENCOUNTER — Encounter: Payer: Self-pay | Admitting: Podiatry

## 2022-02-18 DIAGNOSIS — M2042 Other hammer toe(s) (acquired), left foot: Secondary | ICD-10-CM

## 2022-02-18 DIAGNOSIS — M24576 Contracture, unspecified foot: Secondary | ICD-10-CM

## 2022-02-18 DIAGNOSIS — Z9889 Other specified postprocedural states: Secondary | ICD-10-CM

## 2022-02-18 NOTE — Progress Notes (Signed)
She presents today date of surgery 12/27/2021 release second metatarsophalangeal joint bilateral and hammertoe repair #3 #4 of the left foot.  States that it is feeling fine she says have been out of the garden a lot and the pins are starting to move.  Objective: Vital signs are stable she alert oriented x3 left foot does demonstrate some mild dry skin and scaling with some dirt.  The wires are extending from toes 3 and 4 and are very loose.  No purulence no malodor noted once the K wires were pulled.  Assessment: Well-healing surgical foot.  Plan: Demonstrated to her today how to dress the toes with Coban and I like to follow-up with her in a couple weeks and we will let her get back into her regular tennis shoes.  She can start washing this foot tomorrow.

## 2022-02-20 NOTE — Progress Notes (Unsigned)
Cardiology Office Note   Date:  02/20/2022   ID:  Rachel, [name] 1946-01-25, MRN [id number]  PCP:  Ileana Ladd, MD  Cardiologist:  Danetra Glock [place], MD EP: None  No chief complaint on file.       History of Present Illness: [name] is a 76 y.o. female with a PMH of CAD s/p PCI to OM1 in 1990s and PCI to RCA in 2000, HTN, HLD, paroxysmal atrial fibrillation not on anticoagulation, breast cancer, diverticulum of the esophagus c/b fistula/perforation, COPD, and tobacco abuse, who presents for evaluation of recent bilateral arm pain.  Cardiac history dates back to the 1990s when she had an MI managed with PCI to OM1.  She then had another MI in 2000 and underwent PCI to RCA at that time.  Her last cardiac cath was in 2010 and looked good according to Dr. [place].  Her last ischemic evaluation was a nuclear stress test in 2015 which was low risk.  She had an echocardiogram in 2015 showing EF 60 to 65% with moderate MR.  She is noted to have an isolated episode of paroxysmal atrial fibrillation in 2015 in the setting of sepsis.  Anticoagulation was not felt to be needed going forward.  In addition to her cardiac history she has had spinal surgeries for management of cervical discitis and spinal stenosis.  She has also had bilateral hip replacements, one in 2016 for management of a dislocated hip, and again in 2020 for the right hip after a fall.  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  She had a gastrostomy tube placed.  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  She was last seen in October 2021 was doing well from a cardiac standpoint without any anginal complaints (anginal equivalent is shoulder pain), though unfortunately had resumed smoking.  No medication changes occurred at that visit.  She was  seen in August with recurrent arm pain. Bilateral. Myoview was done and was normal. Echo was also unremarkable with only mild MR.   On follow up today she is doing well from a cardiac standpoint. No chest pain or dyspnea. Still dealing with chronic back pain. Is smoking again. Had quit when she was sick with her esophageal problems but now resumed.      Past Medical History:  Diagnosis Date   Asthma    Breast cancer (HCC)    right breast   COPD (chronic obstructive pulmonary disease) (HCC)    Coronary artery disease    Diverticulum of esophagus    Elevated LFTs    Emphysema lung (HCC)    ETOH abuse    H/O atrial fibrillation without current medication    only one time when she had sepsis   Hyperlipidemia    Hypertension    hx of but not on any medications   Myocardial infarction (HCC) 2000   OA (osteoarthritis) of knee    Osteoarthritis    Tobacco abuse     Past Surgical History:  Procedure Laterality Date   ANTERIOR HIP REVISION Right 10/22/2015   Procedure: RIGHT  HIP REVISION;  Surgeon: Durene Romans, MD;  Location: [facility];  Service: Orthopedics;  Laterality: Right;   APPLICATION OF WOUND VAC N/A 06/20/2015   Procedure: APPLICATION OF INCISIONAL WOUND VAC;  Surgeon: Venita Lick, MD;  Location: [facility] OR;  Service: Orthopedics;  Laterality: N/A;   BREAST SURGERY  1991   right mastectomy   CARDIAC CATHETERIZATION  04/05/2009   EF 60%   CARDIOVASCULAR STRESS TEST  01/31/2005   EF 58%   CESAREAN SECTION  '78, '80, '81   x 3   CORONARY ANGIOPLASTY  08/1998   x2 OF A BIFURCATION OM-1, OM-2 LESION   CORONARY ANGIOPLASTY WITH STENT PLACEMENT  01/1999   MID FIRST OBTUSE MARGINAL VESSEL   CORONARY ANGIOPLASTY WITH STENT PLACEMENT  07/1999   STENTING AT THE CRUX OF THE RIGHT CORONARY ARTERY WITH A 3.8MM X TETRA STENT   DIRECT LARYNGOSCOPY N/A 05/03/2015   Procedure: DIRECT LARYNGOSCOPY;  Surgeon: Flo Shanks, MD;  Location: [facility] OR;  Service: ENT;  Laterality: N/A;   EYE SURGERY   05/18/2014,06/01/2014   BILATERAL CATARACT S WITH LENS IMPLANTS   GASTROSTOMY N/A 05/04/2015   Procedure: OPEN GASTROSTOMY WITH TUBE PLACEMENT;  Surgeon: Manus Rudd, MD;  Location: [facility] OR;  Service: General;  Laterality: N/A;   GASTROSTOMY N/A 11/13/2016   Procedure: INSERTION OF GASTROSTOMY TUBE;  Surgeon: Abigail Miyamoto, MD;  Location: [facility] OR;  Service: General;  Laterality: N/A;   HARDWARE REMOVAL N/A 05/03/2015   Procedure: HARDWARE REMOVAL;  Surgeon: Venita Lick, MD;  Location: [facility] OR;  Service: Orthopedics;  Laterality: N/A;   HIP CLOSED REDUCTION Right 04/26/2015   Procedure: CLOSED REDUCTION HIP;  Surgeon: Venita Lick, MD;  Location: [facility];  Service: Orthopedics;  Laterality: Right;   HYSTEROSCOPY     D & C   INCISION AND DRAINAGE ABSCESS N/A 05/03/2015   Procedure: INCISION AND DRAINAGE CERVICAL  ABSCESS AND REMOVAL OF HARDWARE;  Surgeon: Venita Lick, MD;  Location: [facility];  Service: Orthopedics;  Laterality: N/A;   IR CM INJ ANY COLONIC TUBE W/FLUORO  10/14/2017   IR CM INJ ANY COLONIC TUBE W/FLUORO  10/23/2017   IR CM INJ ANY COLONIC TUBE W/FLUORO  10/28/2017   IR REPLC GASTRO/COLONIC TUBE PERCUT W/FLUORO  09/22/2017   JOINT REPLACEMENT  08/2011   bilateral hip   JOINT REPLACEMENT  01/2012   right hip   MASTECTOMY     neck fusion  2011   ORIF FEMUR FRACTURE Right 06/14/2019   Procedure: ORIF PERI PROSTHETIC FEMUR FRACTURE;  Surgeon: Durene Romans, MD;  Location: [facility] OR;  Service: Orthopedics;  Laterality: Right;   PELVIC LAPAROSCOPY  2002   RSO-     RADICAL NECK DISSECTION N/A 11/08/2016   Procedure: INCISION AND DRAINAGE OF NECK ABSCESS;  Surgeon: Osborn Coho, MD;  Location: [facility] OR;  Service: ENT;  Laterality: N/A;   RADIOLOGY WITH ANESTHESIA Right 06/28/2015   Procedure: MRI OF CERVICAL SPINE  AND RIGHT HIP  WITH AND WITHOUT CONTRAST    (RADIOLOGY WITH ANESTHESIA);  Surgeon: Medication Radiologist, MD;  Location: [facility] OR;  Service: Radiology;  Laterality: Right;   REMOVAL OF  GASTROSTOMY TUBE N/A 11/14/2016   Procedure: REMOVAL OF GASTROSTOMY TUBE W/ REPLACEMENT OF GASTROSTOMY TUBE;  Surgeon: Abigail Miyamoto, MD;  Location: [facility] OR;  Service: General;  Laterality: N/A;   RIGID ESOPHAGOSCOPY N/A 05/03/2015   Procedure: RIGID ESOPHAGOSCOPY;  Surgeon: Flo Shanks, MD;  Location: [facility] OR;  Service: ENT;  Laterality: N/A;   TONSILLECTOMY AND ADENOIDECTOMY     TOTAL HIP ARTHROPLASTY  08/2010   bilat   VULVECTOMY  1981   partial     Current Outpatient Medications  Medication Sig Dispense Refill   albuterol (PROVENTIL HFA;VENTOLIN HFA) 108 (90 BASE)  MCG/ACT inhaler Inhale 2 puffs into the lungs every 6 (six) hours as needed for wheezing.      aspirin EC 81 MG EC tablet Take 1 tablet (81 mg total) by mouth 2 (two) times daily. 60 tablet 0   diclofenac Sodium (VOLTAREN) 1 % GEL Apply topically.     furosemide (LASIX) 20 MG tablet Take 1 tablet (20 mg total) by mouth daily as needed for edema. 90 tablet 3   gabapentin (NEURONTIN) 600 MG tablet Take 600 mg by mouth 4 (four) times daily.      HYDROcodone-acetaminophen (NORCO) 5-325 MG tablet Take 1 tablet by mouth every 6 (six) hours as needed for moderate pain. 15 tablet 0   losartan (COZAAR) 50 MG tablet TAKE 1 TABLET BY MOUTH EVERY DAY 90 tablet 2   metFORMIN (GLUCOPHAGE-XR) 500 MG 24 hr tablet metformin ER 500 mg tablet,extended release 24 hr     methocarbamol (ROBAXIN) 500 MG tablet Take 500 mg by mouth daily as needed.     morphine (MS CONTIN) 30 MG 12 hr tablet Take 30 mg by mouth 4 (four) times daily.      nicotine (NICODERM CQ - DOSED IN MG/24 HOURS) 14 mg/24hr patch Place 1 patch (14 mg total) onto the skin daily. 28 patch 0   ondansetron (ZOFRAN) 4 MG tablet Take 1 tablet (4 mg total) by mouth every 8 (eight) hours as needed. 20 tablet 0   oxyCODONE (ROXICODONE) 15 MG immediate release tablet Take 15 mg by mouth 4 (four) times daily.     rosuvastatin (CRESTOR) 10 MG tablet TAKE 1 TABLET BY MOUTH EVERY DAY 90 tablet 2    No current facility-administered medications for this visit.    Allergies:   Azithromycin, Cefuroxime, Cephalexin, Chlorhexidine, Prednisone, and Zithromax [azithromycin dihydrate]    Social History:  The patient  reports that she has quit smoking. Her smoking use included cigarettes. She has a 50.00 pack-year smoking history. She has never used smokeless tobacco. She reports that she does not drink alcohol and does not use drugs.   Family History:  The patient's family history includes Diabetes in her mother; Heart attack in her father; Heart disease in her father; Hypertension in her father; Stroke in her father.    ROS:  Please see the history of present illness.   Otherwise, review of systems are positive for none.   All other systems are reviewed and negative.    PHYSICAL EXAM: VS:  There were no vitals taken for this visit. , BMI There is no height or weight on file to calculate BMI. GEN: Well nourished, well developed, in no acute distress HEENT: sclera anicteric Neck: no JVD, carotid bruits, or masses Cardiac: RRR; no murmurs, rubs, or gallops, trace LE edema  Respiratory:  clear to auscultation bilaterally, normal work of breathing GI: soft, nontender, nondistended, + BS MS: no deformity or atrophy Skin: warm and dry, no rash Neuro:  Strength and sensation are intact Psych: euthymic mood, full affect   EKG:  EKG is not ordered today.   Recent Labs: No results found for requested labs within last 365 days.    Lipid Panel    Component Value Date/Time   CHOL 135 12/20/2019 0839   TRIG 66 12/20/2019 0839   HDL 51 12/20/2019 0839   CHOLHDL 2.6 12/20/2019 0839   CHOLHDL 3 05/30/2011 0811   VLDL 17.2 05/30/2011 0811   LDLCALC 70 12/20/2019 0839    Dated 08/20/21: cholesterol 144, triglycerides 69, HDL 58,  LDL 73. A1c 6.6%. glucose 143. Otherwise CMET normal.  Wt Readings from Last 3 Encounters:  08/23/21 148 lb 3.2 oz (67.2 kg)  06/06/21 152 lb (68.9 kg)   05/14/21 152 lb 6.4 oz (69.1 kg)      Other studies Reviewed: Additional studies/ records that were reviewed today include:   Echocardiogram 2015: Study Conclusions   - Left ventricle: The cavity size was normal. Wall thickness was    increased in a pattern of mild LVH. Systolic function was normal.    The estimated ejection fraction was in the range of 60% to 65%.  - Mitral valve: Poorly visualized in apical views. MAC wtih    restricted posterior leaflet motion and likely moderate MR.  - Left atrium: The atrium was mildly dilated.  - Pulmonary arteries: PA peak pressure: 39 mm Hg (S).  - Pericardium, extracardiac: Likely large epicardial fat pad and    small apical effusion Consider CT to further assess pericardial    space.  - Impressions: Overall image quality is poor with non diagnostic    apical views.   Impressions:   - Overall image quality is poor with non diagnostic apical views.   NST 2015: Impression Exercise Capacity:  Lexiscan with no exercise. BP Response:  Normal blood pressure response. Clinical Symptoms:  There is dyspnea. ECG Impression:  Uninterpretable due to baseline changes. Comparison with Prior Nuclear Study: Compared to 06/24/11, no significant change.   Overall Impression:  Normal stress nuclear study.   LV Wall Motion:  NL LV Function; NL Wall Motion  Myoview 06/06/21: Study Highlights      The study is normal. The study is low risk.   No ST deviation was noted.   Left ventricular function is normal. Nuclear stress EF: 58 %. The left ventricular ejection fraction is normal (55-65%). End diastolic cavity size is normal.   Prior study available for comparison from 07/27/2014.   Low risk stress nuclear study with normal perfusion and normal left ventricular regional and global systolic function.   Echo 06/06/21: IMPRESSIONS     1. Left ventricular ejection fraction, by estimation, is 65 to 70%. The  left ventricle has normal function. The  left ventricle has no regional  wall motion abnormalities. Left ventricular diastolic parameters are  indeterminate.   2. Right ventricular systolic function is normal. The right ventricular  size is normal. There is normal pulmonary artery systolic pressure.   3. Left atrial size was moderately dilated.   4. Right atrial size was moderately dilated.   5. The mitral valve was not well visualized. Mild mitral valve  regurgitation. No evidence of mitral stenosis. Moderate to severe mitral  annular calcification.   6. The aortic valve is tricuspid. Aortic valve regurgitation is not  visualized. No aortic stenosis is present.   7. The inferior vena cava is normal in size with greater than 50%  respiratory variability, suggesting right atrial pressure of 3 mmHg.   Comparison(s): Mitral valve not well visualized, but MR appears mild  (prior moderate).    ASSESSMENT AND PLAN:  1. CAD s/p PCI to OM1 in 1990s and RCA in 2000:  - normal Myoview in September 2021 - no active angina  2. HTN: BP is well controlled.  - continue current medication.   3. HLD: LDL 73  - Continue rosuvastatin  4. Paroxysmal atrial fibrillation: isolated episode in 2015 in the setting of sepsis, therefore anticoagulation not recommended. No known recurrence.   5. Mitral regurgitation: moderate  on echo in 2015 - repeat Echo in August with only mild MR  6. Tobacco abuse: - recommend smoking cessation   Current medicines are reviewed at length with the patient today.  The patient does not have concerns regarding medicines.  The following changes have been made:  As above  Labs/ tests ordered today include:   No orders of the defined types were placed in this encounter.    Disposition:   FU 6 months  Signed, Rachel Winchell [place], MD  02/20/2022 7:27 AM

## 2022-02-21 ENCOUNTER — Ambulatory Visit (INDEPENDENT_AMBULATORY_CARE_PROVIDER_SITE_OTHER): Payer: Medicare Other | Admitting: Podiatry

## 2022-02-21 ENCOUNTER — Encounter: Payer: Self-pay | Admitting: Podiatry

## 2022-02-21 DIAGNOSIS — M79675 Pain in left toe(s): Secondary | ICD-10-CM | POA: Diagnosis not present

## 2022-02-21 DIAGNOSIS — Z87891 Personal history of nicotine dependence: Secondary | ICD-10-CM | POA: Insufficient documentation

## 2022-02-21 DIAGNOSIS — Z9010 Acquired absence of unspecified breast and nipple: Secondary | ICD-10-CM | POA: Insufficient documentation

## 2022-02-21 DIAGNOSIS — N649 Disorder of breast, unspecified: Secondary | ICD-10-CM | POA: Insufficient documentation

## 2022-02-21 DIAGNOSIS — M40209 Unspecified kyphosis, site unspecified: Secondary | ICD-10-CM | POA: Insufficient documentation

## 2022-02-21 DIAGNOSIS — R0989 Other specified symptoms and signs involving the circulatory and respiratory systems: Secondary | ICD-10-CM | POA: Insufficient documentation

## 2022-02-21 DIAGNOSIS — R0601 Orthopnea: Secondary | ICD-10-CM | POA: Insufficient documentation

## 2022-02-21 DIAGNOSIS — M419 Scoliosis, unspecified: Secondary | ICD-10-CM | POA: Insufficient documentation

## 2022-02-21 DIAGNOSIS — L659 Nonscarring hair loss, unspecified: Secondary | ICD-10-CM | POA: Insufficient documentation

## 2022-02-21 DIAGNOSIS — D649 Anemia, unspecified: Secondary | ICD-10-CM | POA: Insufficient documentation

## 2022-02-21 DIAGNOSIS — M542 Cervicalgia: Secondary | ICD-10-CM | POA: Insufficient documentation

## 2022-02-21 DIAGNOSIS — R197 Diarrhea, unspecified: Secondary | ICD-10-CM | POA: Insufficient documentation

## 2022-02-21 DIAGNOSIS — Z79899 Other long term (current) drug therapy: Secondary | ICD-10-CM | POA: Insufficient documentation

## 2022-02-21 DIAGNOSIS — Z931 Gastrostomy status: Secondary | ICD-10-CM | POA: Insufficient documentation

## 2022-02-21 DIAGNOSIS — S301XXA Contusion of abdominal wall, initial encounter: Secondary | ICD-10-CM | POA: Insufficient documentation

## 2022-02-21 DIAGNOSIS — L84 Corns and callosities: Secondary | ICD-10-CM

## 2022-02-21 DIAGNOSIS — B351 Tinea unguium: Secondary | ICD-10-CM

## 2022-02-21 DIAGNOSIS — M79674 Pain in right toe(s): Secondary | ICD-10-CM

## 2022-02-21 DIAGNOSIS — E1165 Type 2 diabetes mellitus with hyperglycemia: Secondary | ICD-10-CM | POA: Insufficient documentation

## 2022-02-21 DIAGNOSIS — R809 Proteinuria, unspecified: Secondary | ICD-10-CM | POA: Insufficient documentation

## 2022-02-21 DIAGNOSIS — M549 Dorsalgia, unspecified: Secondary | ICD-10-CM | POA: Insufficient documentation

## 2022-02-21 DIAGNOSIS — K2289 Other specified disease of esophagus: Secondary | ICD-10-CM | POA: Insufficient documentation

## 2022-02-21 DIAGNOSIS — R059 Cough, unspecified: Secondary | ICD-10-CM | POA: Insufficient documentation

## 2022-02-21 DIAGNOSIS — R9389 Abnormal findings on diagnostic imaging of other specified body structures: Secondary | ICD-10-CM | POA: Insufficient documentation

## 2022-02-21 DIAGNOSIS — F17200 Nicotine dependence, unspecified, uncomplicated: Secondary | ICD-10-CM | POA: Insufficient documentation

## 2022-02-21 DIAGNOSIS — I8310 Varicose veins of unspecified lower extremity with inflammation: Secondary | ICD-10-CM | POA: Insufficient documentation

## 2022-02-21 DIAGNOSIS — K225 Diverticulum of esophagus, acquired: Secondary | ICD-10-CM | POA: Insufficient documentation

## 2022-02-21 DIAGNOSIS — M4010 Other secondary kyphosis, site unspecified: Secondary | ICD-10-CM | POA: Insufficient documentation

## 2022-02-21 DIAGNOSIS — R509 Fever, unspecified: Secondary | ICD-10-CM | POA: Insufficient documentation

## 2022-02-21 DIAGNOSIS — D500 Iron deficiency anemia secondary to blood loss (chronic): Secondary | ICD-10-CM | POA: Insufficient documentation

## 2022-02-21 DIAGNOSIS — R600 Localized edema: Secondary | ICD-10-CM | POA: Insufficient documentation

## 2022-02-21 DIAGNOSIS — F419 Anxiety disorder, unspecified: Secondary | ICD-10-CM | POA: Insufficient documentation

## 2022-02-21 DIAGNOSIS — E119 Type 2 diabetes mellitus without complications: Secondary | ICD-10-CM | POA: Diagnosis not present

## 2022-02-21 DIAGNOSIS — Z1231 Encounter for screening mammogram for malignant neoplasm of breast: Secondary | ICD-10-CM | POA: Insufficient documentation

## 2022-02-21 DIAGNOSIS — E2839 Other primary ovarian failure: Secondary | ICD-10-CM | POA: Insufficient documentation

## 2022-02-21 DIAGNOSIS — C50919 Malignant neoplasm of unspecified site of unspecified female breast: Secondary | ICD-10-CM | POA: Insufficient documentation

## 2022-02-21 DIAGNOSIS — I700 Atherosclerosis of aorta: Secondary | ICD-10-CM | POA: Insufficient documentation

## 2022-02-24 ENCOUNTER — Encounter: Payer: Self-pay | Admitting: Cardiology

## 2022-02-24 ENCOUNTER — Ambulatory Visit (INDEPENDENT_AMBULATORY_CARE_PROVIDER_SITE_OTHER): Payer: Medicare Other | Admitting: Cardiology

## 2022-02-24 VITALS — BP 142/72 | HR 98 | Ht 60.0 in | Wt 143.2 lb

## 2022-02-24 DIAGNOSIS — I25118 Atherosclerotic heart disease of native coronary artery with other forms of angina pectoris: Secondary | ICD-10-CM | POA: Diagnosis not present

## 2022-02-24 DIAGNOSIS — E785 Hyperlipidemia, unspecified: Secondary | ICD-10-CM | POA: Diagnosis not present

## 2022-02-24 DIAGNOSIS — Z720 Tobacco use: Secondary | ICD-10-CM

## 2022-02-24 DIAGNOSIS — I10 Essential (primary) hypertension: Secondary | ICD-10-CM

## 2022-02-24 DIAGNOSIS — J449 Chronic obstructive pulmonary disease, unspecified: Secondary | ICD-10-CM | POA: Diagnosis not present

## 2022-02-24 NOTE — Patient Instructions (Signed)

## 2022-02-25 NOTE — Progress Notes (Signed)
ANNUAL DIABETIC FOOT EXAM  Subjective: [name] presents today for annual diabetic foot examination.  Patient relates 30 year h/o diabetes.  Patient denies any h/o foot wounds. She is recovering from surgical correction of toes 3, 4 left foot and b/l 2nd digits She notes no new problems. She would like instructions on how to apply the coban wrap to her toes.  Last known  HgA1c was unknown. Patient did not check blood glucose this morning.  Risk factors:  Raynaud's disease, diabetes, h/o MI, CAD, COPD, hyperlipidemia, h/o tobacco use in remission.  Vernie Shanks, MD is patient's PCP. Last visit was two months ago.  Past Medical History:  Diagnosis Date   Asthma    Breast cancer ([place])    right breast   COPD (chronic obstructive pulmonary disease) (HCC)    Coronary artery disease    Diverticulum of esophagus    Elevated LFTs    Emphysema lung (HCC)    ETOH abuse    H/O atrial fibrillation without current medication    only one time when she had sepsis   Hyperlipidemia    Hypertension    hx of but not on any medications   Myocardial infarction ([place]) 2000   OA (osteoarthritis) of knee    Osteoarthritis    Tobacco abuse    Patient Active Problem List   Diagnosis Date Noted   Acquired absence of unspecified breast and nipple 02/21/2022   Acquired diverticulum of esophagus 02/21/2022   Acquired scoliosis 02/21/2022   Alopecia 02/21/2022   Anemia 02/21/2022   Anxiety disorder 02/21/2022   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   Back pain 02/21/2022   Cardiovascular symptoms 02/21/2022   Contusion of abdominal wall 02/21/2022   Cough 02/21/2022   Decreased estrogen level 02/21/2022   Diarrhea 02/21/2022   Disorder of breast 02/21/2022   Encounter for screening mammogram for malignant neoplasm of breast 02/21/2022   Former smoker 02/21/2022   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[place]) 02/21/2022   Hardening of the aorta (main artery of the heart) ([place]) 02/21/2022   Iron deficiency  anemia due to chronic blood loss 02/21/2022   Kyphosis 02/21/2022   Malignant neoplasm of female breast ([place]) 02/21/2022   Neck pain 02/21/2022   Orthopnea 02/21/2022   Other long term (current) drug therapy 02/21/2022   Pedal edema 02/21/2022   Proteinuria 02/21/2022   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   Smoker 02/21/2022   Standard chest x-ray abnormal 02/21/2022   Temperature elevated 02/21/2022   Type 2 diabetes mellitus with hyperglycemia ([place]) 02/21/2022   Varicose veins of lower extremity with inflammation 02/21/2022   Acute postoperative pain 11/19/2021   Chronic prescription opiate use 11/19/2021   S/P flap graft 11/19/2021   Raynaud's disease 05/28/2021   Acquired trigger finger of right ring finger 05/27/2021   Pain in right hand 05/27/2021   Closed fracture of shaft of right femur ([place]) 06/13/2019   Cervical radiculopathy 03/16/2019   Gastrostomy tube dysfunction ([place]) 06/25/2018   Impingement syndrome of left shoulder region 05/24/2018   Impingement syndrome of right shoulder region 05/24/2018   Osteoarthritis 03/01/2018   Fracture of multiple ribs of left side 11/29/2017   History of breast cancer 11/29/2017   Pressure injury of sacral region, stage 1 11/29/2017   Pathological fracture of pelvis due to age-related osteoporosis ([place]) 11/22/2017   Syncope and collapse 11/22/2017   Empyema with fistula ([employer]) 11/11/2017   Chronic pain 09/20/2017   CAD (coronary artery disease) 01/26/2017   History of  atrial fibrillation 01/26/2017   Tobacco consumption 02/12/2016   S/P right TH revision 10/22/2015   S/P revision of total hip 10/22/2015   Cholecystitis    Esophageal fistula    Abscess    Discitis    HCAP (healthcare-associated pneumonia)    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   Abscess of neck    Esophageal perforation    Loss of weight    Esophageal dysphagia    Acute renal failure syndrome (HCC)    Pneumonia    Respiratory  failure ([place])    Hyponatremia 08/26/2014   COPD (chronic obstructive pulmonary disease) (HCC)    Tobacco dependence    Essential hypertension    CAD S/P percutaneous coronary angioplasty    Past Surgical History:  Procedure Laterality Date   ANTERIOR HIP REVISION Right 10/22/2015   Procedure: RIGHT  HIP REVISION;  Surgeon: Paralee Cancel, MD;  Location: [facility];  Service: Orthopedics;  Laterality: Right;   APPLICATION OF WOUND VAC N/A 06/20/2015   Procedure: APPLICATION OF INCISIONAL WOUND VAC;  Surgeon: Melina Schools, MD;  Location: [place];  Service: Orthopedics;  Laterality: N/A;   BREAST SURGERY  1991   right mastectomy   CARDIAC CATHETERIZATION  04/05/2009   EF 60%   CARDIOVASCULAR STRESS TEST  01/31/2005   EF 58%   CESAREAN SECTION  '78, '80, '81   x 3   CORONARY ANGIOPLASTY  08/1998   x2 OF A BIFURCATION OM-1, OM-2 LESION   CORONARY ANGIOPLASTY WITH STENT PLACEMENT  01/1999   MID FIRST OBTUSE MARGINAL VESSEL   CORONARY ANGIOPLASTY WITH STENT PLACEMENT  07/1999   STENTING AT THE CRUX OF THE RIGHT CORONARY ARTERY WITH A 3.8MM X 18MM TETRA STENT   DIRECT LARYNGOSCOPY N/A 05/03/2015   Procedure: DIRECT LARYNGOSCOPY;  Surgeon: Jodi Marble, MD;  Location: [facility];  Service: ENT;  Laterality: N/A;   EYE SURGERY  05/18/2014,06/01/2014   BILATERAL CATARACT S WITH LENS IMPLANTS   GASTROSTOMY N/A 05/04/2015   Procedure: OPEN GASTROSTOMY WITH TUBE PLACEMENT;  Surgeon: Donnie Mesa, MD;  Location: [place];  Service: General;  Laterality: N/A;   GASTROSTOMY N/A 11/13/2016   Procedure: INSERTION OF GASTROSTOMY TUBE;  Surgeon: Coralie Keens, MD;  Location: [place];  Service: General;  Laterality: N/A;   HARDWARE REMOVAL N/A 05/03/2015   Procedure: HARDWARE REMOVAL;  Surgeon: Melina Schools, MD;  Location: [place];  Service: Orthopedics;  Laterality: N/A;   HIP CLOSED REDUCTION Right 04/26/2015   Procedure: CLOSED REDUCTION HIP;  Surgeon: Melina Schools, MD;  Location: [facility];  Service: Orthopedics;   Laterality: Right;   HYSTEROSCOPY     D & C   INCISION AND DRAINAGE ABSCESS N/A 05/03/2015   Procedure: INCISION AND DRAINAGE CERVICAL  ABSCESS AND REMOVAL OF HARDWARE;  Surgeon: Melina Schools, MD;  Location: [place];  Service: Orthopedics;  Laterality: N/A;   IR CM INJ ANY COLONIC TUBE W/FLUORO  10/14/2017   IR CM INJ ANY COLONIC TUBE W/FLUORO  10/23/2017   IR CM INJ ANY COLONIC TUBE W/FLUORO  10/28/2017   IR REPLC GASTRO/COLONIC TUBE PERCUT W/FLUORO  09/22/2017   JOINT REPLACEMENT  08/2011   bilateral hip   JOINT REPLACEMENT  01/2012   right hip   MASTECTOMY     neck fusion  2011   ORIF FEMUR FRACTURE Right 06/14/2019   Procedure: ORIF PERI PROSTHETIC FEMUR FRACTURE;  Surgeon: Paralee Cancel, MD;  Location: [place];  Service: Orthopedics;  Laterality: Right;   PELVIC LAPAROSCOPY  2002   RSO-     RADICAL NECK DISSECTION N/A 11/08/2016   Procedure: INCISION AND DRAINAGE OF NECK ABSCESS;  Surgeon: Jerrell Belfast, MD;  Location: [place];  Service: ENT;  Laterality: N/A;   RADIOLOGY WITH ANESTHESIA Right 06/28/2015   Procedure: MRI OF CERVICAL SPINE  AND RIGHT HIP  WITH AND WITHOUT CONTRAST    (RADIOLOGY WITH ANESTHESIA);  Surgeon: Medication Radiologist, MD;  Location: [place];  Service: Radiology;  Laterality: Right;   REMOVAL OF GASTROSTOMY TUBE N/A 11/14/2016   Procedure: REMOVAL OF GASTROSTOMY TUBE W/ REPLACEMENT OF GASTROSTOMY TUBE;  Surgeon: Coralie Keens, MD;  Location: [place];  Service: General;  Laterality: N/A;   RIGID ESOPHAGOSCOPY N/A 05/03/2015   Procedure: RIGID ESOPHAGOSCOPY;  Surgeon: Jodi Marble, MD;  Location: [facility];  Service: ENT;  Laterality: N/A;   TONSILLECTOMY AND ADENOIDECTOMY     TOTAL HIP ARTHROPLASTY  08/2010   bilat   VULVECTOMY  1981   partial   Current Outpatient Medications on File Prior to Visit  Medication Sig Dispense Refill   albuterol (PROVENTIL HFA;VENTOLIN HFA) 108 (90 BASE) MCG/ACT inhaler Inhale 2 puffs into the lungs every 6 (six) hours as needed for  wheezing.      aspirin EC 81 MG EC tablet Take 1 tablet (81 mg total) by mouth 2 (two) times daily. 60 tablet 0   diclofenac Sodium (VOLTAREN) 1 % GEL Apply topically.     furosemide (LASIX) 20 MG tablet Take 1 tablet (20 mg total) by mouth daily as needed for edema. 90 tablet 3   gabapentin (NEURONTIN) 600 MG tablet Take 600 mg by mouth 4 (four) times daily.      losartan (COZAAR) 50 MG tablet TAKE 1 TABLET BY MOUTH EVERY DAY 90 tablet 2   metFORMIN (GLUCOPHAGE-XR) 500 MG 24 hr tablet metformin ER 500 mg tablet,extended release 24 hr     methocarbamol (ROBAXIN) 500 MG tablet Take 500 mg by mouth daily as needed.     morphine (MS CONTIN) 30 MG 12 hr tablet Take 30 mg by mouth 4 (four) times daily.      oxyCODONE (ROXICODONE) 15 MG immediate release tablet Take 15 mg by mouth 4 (four) times daily.     rosuvastatin (CRESTOR) 10 MG tablet TAKE 1 TABLET BY MOUTH EVERY DAY 90 tablet 2   No current facility-administered medications on file prior to visit.    Allergies  Allergen Reactions   Azithromycin Other (See Comments)   Cefuroxime Other (See Comments)   Cephalexin Other (See Comments)    Took off first layer of skin inside of mouth   Chlorhexidine    Prednisone    Zithromax [Azithromycin Dihydrate] Other (See Comments)    ORAL ULCERS    Social History   Occupational History   Occupation: Optometrist  Tobacco Use   Smoking status: Former    Packs/day: 1.00    Years: 50.00    Total pack years: 50.00    Types: Cigarettes   Smokeless tobacco: Never   Tobacco comments:    using chantix  Vaping Use   Vaping Use: Never used  Substance and Sexual Activity   Alcohol use: No    Alcohol/week: 0.0 standard drinks of alcohol    Comment: former alcohol abuse   Drug use: No   Sexual activity: Not on file   Family History  Problem Relation Age of Onset   Diabetes Mother    Hypertension Father    Heart disease Father  Heart attack Father    Stroke Father    Immunization History   Administered Date(s) Administered   Influenza Split 08/01/2011   Influenza, High Dose Seasonal PF 05/06/2019   Influenza,inj,Quad PF,6+ Mos 08/28/2014, 06/06/2015, 06/16/2017, 06/01/2018   Influenza,inj,quad, With Preservative 06/15/2017, 05/06/2019   Pneumococcal Conjugate-13 08/01/2011   Tdap 08/01/2011   Zoster Recombinat (Shingrix) 04/29/2019     Review of Systems: Negative except as noted in the HPI.   Objective: There were no vitals filed for this visit.  [name] is a pleasant 77 y.o. female in NAD. AAO X 3.  Vascular Examination: CFT <3 seconds b/l LE. Faintly palpable pedal pulses b/l. Pedal hair absent. No pain with calf compression b/l. Lower extremity skin temperature gradient within normal limits. Nonpitting edema noted BLE. Varicosities present b/l. No cyanosis or clubbing noted b/l LE.  Dermatological Examination: Pedal skin is warm and supple b/l LE. No open wounds b/l LE. No interdigital macerations noted b/l LE. Toenails 1-5 b/l elongated, discolored, dystrophic, thickened, crumbly with subungual debris and tenderness to dorsal palpation. Hyperkeratotic lesion(s) L 3rd toe, L 4th toe, and R 2nd toe.  No erythema, no edema, no drainage, no fluctuance.  Neurological Examination: Pt has subjective symptoms of neuropathy. Protective sensation intact 5/5 intact bilaterally with 10g monofilament b/l. Vibratory sensation intact b/l.  Musculoskeletal Examination: Muscle strength 5/5 to all lower extremity muscle groups bilaterally. Toes 3 and 4 left foot rectus with mild edema.Marland Kitchen  Footwear Assessment: Does the patient wear appropriate shoes? Yes. Does the patient need inserts/orthotics? No.  Assessment: 1. Pain due to onychomycosis of toenail   2. Corns   3. Controlled type 2 diabetes mellitus without complication, without long-term current use of insulin ([place])   4. Encounter for diabetic foot exam ([place])     ADA Risk Categorization: Low Risk :  Patient has  all of the following: Intact protective sensation No prior foot ulcer  No severe deformity Pedal pulses present  Plan: -Patient was evaluated and treated. All patient's and/or POA's questions/concerns answered on today's visit. -I applied a coban wrap to toes 3 and 4 left foot with visual and verbal instructions. Instructed her to not stretch the coban when applying it, but just lay it on and wrap it. If toes become numb after wrapping, her dressing is too tight. She related understanding. -Diabetic foot examination performed today. -Continue foot and shoe inspections daily. Monitor blood glucose per PCP/Endocrinologist's recommendations. -Toenails 1-5 b/l were debrided in length and girth with sterile nail nippers and dremel without iatrogenic bleeding.  -Residual corn(s)  distal tip L 3rd toe, L 4th toe, and R 2nd toe pared utilizing sterile scalpel blade without complication or incident. Total number debrided=3. -Patient/POA to call should there be question/concern in the interim. Return in about 3 months (around 05/24/2022).  Marzetta Board, DPM

## 2022-02-27 ENCOUNTER — Ambulatory Visit: Payer: Medicare Other | Admitting: Cardiology

## 2022-05-27 ENCOUNTER — Ambulatory Visit: Payer: Medicare Other | Admitting: Podiatry

## 2022-05-27 ENCOUNTER — Encounter: Payer: Self-pay | Admitting: Podiatry

## 2022-05-27 DIAGNOSIS — E119 Type 2 diabetes mellitus without complications: Secondary | ICD-10-CM | POA: Diagnosis not present

## 2022-05-27 DIAGNOSIS — M2042 Other hammer toe(s) (acquired), left foot: Secondary | ICD-10-CM

## 2022-05-27 DIAGNOSIS — B351 Tinea unguium: Secondary | ICD-10-CM | POA: Diagnosis not present

## 2022-05-27 DIAGNOSIS — M79676 Pain in unspecified toe(s): Secondary | ICD-10-CM | POA: Diagnosis not present

## 2022-05-27 DIAGNOSIS — L84 Corns and callosities: Secondary | ICD-10-CM

## 2022-05-27 DIAGNOSIS — R0989 Other specified symptoms and signs involving the circulatory and respiratory systems: Secondary | ICD-10-CM

## 2022-05-27 NOTE — Progress Notes (Signed)
This patient returns to my office for at risk foot care.  This patient requires this care by a professional since this patient will be at risk due to having type 2 diabetes, raynauds disease and renal failure This patient is unable to cut nails herself since the patient cannot reach her nails.These nails are painful walking and wearing shoes.  This patient presents for at risk foot care today.  General Appearance  Alert, conversant and in no acute stress.  Vascular  Dorsalis pedis and posterior tibial  pulses are weakly palpable  bilaterally.  Capillary return is within normal limits  bilaterally. Temperature is within normal limits  bilaterally.  Neurologic  Senn-Weinstein monofilament wire test within normal limits  bilaterally. Muscle power within normal limits bilaterally.  Nails Thick disfigured discolored nails with subungual debris  from hallux to fifth toes bilaterally. No evidence of bacterial infection or drainage bilaterally.  Orthopedic  No limitations of motion  feet .  No crepitus or effusions noted.  No bony pathology or digital deformities noted.  Skin  normotropic skin with no porokeratosis noted bilaterally.  No signs of infections or ulcers noted.     Onychomycosis  Pain in right toes  Pain in left toes  Consent was obtained for treatment procedures.   Mechanical debridement of nails 1-5  bilaterally performed with a nail nipper.  Filed with dremel without incident.    Return office visit    3 months                  Told patient to return for periodic foot care and evaluation due to potential at risk complications.   Gardiner Barefoot DPM

## 2022-06-09 ENCOUNTER — Ambulatory Visit: Payer: Medicare Other | Admitting: Cardiology

## 2022-06-11 ENCOUNTER — Other Ambulatory Visit: Payer: Self-pay | Admitting: Cardiology

## 2022-06-25 ENCOUNTER — Other Ambulatory Visit: Payer: Self-pay | Admitting: Cardiology

## 2022-08-05 ENCOUNTER — Ambulatory Visit: Payer: Medicare Other | Admitting: Podiatry

## 2022-08-08 ENCOUNTER — Encounter (HOSPITAL_COMMUNITY): Payer: Self-pay | Admitting: Internal Medicine

## 2022-08-08 ENCOUNTER — Emergency Department (HOSPITAL_COMMUNITY): Payer: Medicare Other

## 2022-08-08 ENCOUNTER — Observation Stay (HOSPITAL_COMMUNITY): Payer: Medicare Other

## 2022-08-08 ENCOUNTER — Inpatient Hospital Stay (HOSPITAL_COMMUNITY)
Admission: EM | Admit: 2022-08-08 | Discharge: 2022-08-12 | DRG: 371 | Disposition: A | Discharge status: Home / Self Care | Payer: Medicare Other | Attending: Internal Medicine | Admitting: Internal Medicine

## 2022-08-08 DIAGNOSIS — Z79899 Other long term (current) drug therapy: Secondary | ICD-10-CM

## 2022-08-08 DIAGNOSIS — Z681 Body mass index (BMI) 19 or less, adult: Secondary | ICD-10-CM | POA: Diagnosis not present

## 2022-08-08 DIAGNOSIS — A0472 Enterocolitis due to Clostridium difficile, not specified as recurrent: Secondary | ICD-10-CM | POA: Diagnosis present

## 2022-08-08 DIAGNOSIS — E119 Type 2 diabetes mellitus without complications: Secondary | ICD-10-CM | POA: Diagnosis present

## 2022-08-08 DIAGNOSIS — Z881 Allergy status to other antibiotic agents status: Secondary | ICD-10-CM

## 2022-08-08 DIAGNOSIS — Z043 Encounter for examination and observation following other accident: Secondary | ICD-10-CM | POA: Diagnosis not present

## 2022-08-08 DIAGNOSIS — I2510 Atherosclerotic heart disease of native coronary artery without angina pectoris: Secondary | ICD-10-CM | POA: Diagnosis present

## 2022-08-08 DIAGNOSIS — I7300 Raynaud's syndrome without gangrene: Secondary | ICD-10-CM | POA: Diagnosis present

## 2022-08-08 DIAGNOSIS — J449 Chronic obstructive pulmonary disease, unspecified: Secondary | ICD-10-CM | POA: Diagnosis not present

## 2022-08-08 DIAGNOSIS — J441 Chronic obstructive pulmonary disease with (acute) exacerbation: Secondary | ICD-10-CM | POA: Diagnosis present

## 2022-08-08 DIAGNOSIS — M4642 Discitis, unspecified, cervical region: Secondary | ICD-10-CM | POA: Diagnosis not present

## 2022-08-08 DIAGNOSIS — R531 Weakness: Secondary | ICD-10-CM | POA: Diagnosis not present

## 2022-08-08 DIAGNOSIS — F1120 Opioid dependence, uncomplicated: Secondary | ICD-10-CM | POA: Diagnosis present

## 2022-08-08 DIAGNOSIS — N179 Acute kidney failure, unspecified: Secondary | ICD-10-CM | POA: Diagnosis present

## 2022-08-08 DIAGNOSIS — Z9011 Acquired absence of right breast and nipple: Secondary | ICD-10-CM

## 2022-08-08 DIAGNOSIS — K6389 Other specified diseases of intestine: Secondary | ICD-10-CM | POA: Diagnosis not present

## 2022-08-08 DIAGNOSIS — J9811 Atelectasis: Secondary | ICD-10-CM | POA: Diagnosis not present

## 2022-08-08 DIAGNOSIS — G9341 Metabolic encephalopathy: Secondary | ICD-10-CM | POA: Diagnosis not present

## 2022-08-08 DIAGNOSIS — T402X1A Poisoning by other opioids, accidental (unintentional), initial encounter: Secondary | ICD-10-CM | POA: Diagnosis present

## 2022-08-08 DIAGNOSIS — I10 Essential (primary) hypertension: Secondary | ICD-10-CM | POA: Diagnosis not present

## 2022-08-08 DIAGNOSIS — Z888 Allergy status to other drugs, medicaments and biological substances status: Secondary | ICD-10-CM

## 2022-08-08 DIAGNOSIS — E43 Unspecified severe protein-calorie malnutrition: Secondary | ICD-10-CM | POA: Insufficient documentation

## 2022-08-08 DIAGNOSIS — K529 Noninfective gastroenteritis and colitis, unspecified: Secondary | ICD-10-CM | POA: Insufficient documentation

## 2022-08-08 DIAGNOSIS — E1165 Type 2 diabetes mellitus with hyperglycemia: Secondary | ICD-10-CM | POA: Diagnosis present

## 2022-08-08 DIAGNOSIS — R0902 Hypoxemia: Secondary | ICD-10-CM | POA: Diagnosis not present

## 2022-08-08 DIAGNOSIS — I252 Old myocardial infarction: Secondary | ICD-10-CM

## 2022-08-08 DIAGNOSIS — Z981 Arthrodesis status: Secondary | ICD-10-CM | POA: Diagnosis not present

## 2022-08-08 DIAGNOSIS — Z7984 Long term (current) use of oral hypoglycemic drugs: Secondary | ICD-10-CM

## 2022-08-08 DIAGNOSIS — M419 Scoliosis, unspecified: Secondary | ICD-10-CM | POA: Diagnosis not present

## 2022-08-08 DIAGNOSIS — M4319 Spondylolisthesis, multiple sites in spine: Secondary | ICD-10-CM | POA: Diagnosis not present

## 2022-08-08 DIAGNOSIS — Z853 Personal history of malignant neoplasm of breast: Secondary | ICD-10-CM | POA: Diagnosis not present

## 2022-08-08 DIAGNOSIS — Z955 Presence of coronary angioplasty implant and graft: Secondary | ICD-10-CM

## 2022-08-08 DIAGNOSIS — G928 Other toxic encephalopathy: Secondary | ICD-10-CM | POA: Diagnosis present

## 2022-08-08 DIAGNOSIS — Z8249 Family history of ischemic heart disease and other diseases of the circulatory system: Secondary | ICD-10-CM

## 2022-08-08 DIAGNOSIS — Z833 Family history of diabetes mellitus: Secondary | ICD-10-CM

## 2022-08-08 DIAGNOSIS — R7303 Prediabetes: Secondary | ICD-10-CM | POA: Insufficient documentation

## 2022-08-08 DIAGNOSIS — W19XXXA Unspecified fall, initial encounter: Secondary | ICD-10-CM | POA: Insufficient documentation

## 2022-08-08 DIAGNOSIS — Z1152 Encounter for screening for COVID-19: Secondary | ICD-10-CM | POA: Diagnosis not present

## 2022-08-08 DIAGNOSIS — I340 Nonrheumatic mitral (valve) insufficiency: Secondary | ICD-10-CM | POA: Diagnosis not present

## 2022-08-08 DIAGNOSIS — E785 Hyperlipidemia, unspecified: Secondary | ICD-10-CM | POA: Diagnosis not present

## 2022-08-08 DIAGNOSIS — G8929 Other chronic pain: Secondary | ICD-10-CM | POA: Diagnosis not present

## 2022-08-08 DIAGNOSIS — J9601 Acute respiratory failure with hypoxia: Secondary | ICD-10-CM | POA: Diagnosis not present

## 2022-08-08 DIAGNOSIS — R911 Solitary pulmonary nodule: Secondary | ICD-10-CM | POA: Diagnosis not present

## 2022-08-08 DIAGNOSIS — R059 Cough, unspecified: Secondary | ICD-10-CM | POA: Diagnosis not present

## 2022-08-08 DIAGNOSIS — Z96643 Presence of artificial hip joint, bilateral: Secondary | ICD-10-CM | POA: Diagnosis present

## 2022-08-08 DIAGNOSIS — E876 Hypokalemia: Secondary | ICD-10-CM | POA: Diagnosis present

## 2022-08-08 DIAGNOSIS — S0990XA Unspecified injury of head, initial encounter: Secondary | ICD-10-CM | POA: Diagnosis not present

## 2022-08-08 DIAGNOSIS — M4802 Spinal stenosis, cervical region: Secondary | ICD-10-CM | POA: Diagnosis present

## 2022-08-08 DIAGNOSIS — R296 Repeated falls: Secondary | ICD-10-CM | POA: Diagnosis present

## 2022-08-08 DIAGNOSIS — J439 Emphysema, unspecified: Secondary | ICD-10-CM | POA: Diagnosis not present

## 2022-08-08 DIAGNOSIS — J984 Other disorders of lung: Secondary | ICD-10-CM

## 2022-08-08 DIAGNOSIS — Z823 Family history of stroke: Secondary | ICD-10-CM

## 2022-08-08 DIAGNOSIS — R4182 Altered mental status, unspecified: Secondary | ICD-10-CM

## 2022-08-08 DIAGNOSIS — Y92009 Unspecified place in unspecified non-institutional (private) residence as the place of occurrence of the external cause: Secondary | ICD-10-CM | POA: Insufficient documentation

## 2022-08-08 DIAGNOSIS — I959 Hypotension, unspecified: Secondary | ICD-10-CM | POA: Diagnosis not present

## 2022-08-08 DIAGNOSIS — R7989 Other specified abnormal findings of blood chemistry: Secondary | ICD-10-CM | POA: Diagnosis not present

## 2022-08-08 DIAGNOSIS — Z7982 Long term (current) use of aspirin: Secondary | ICD-10-CM

## 2022-08-08 LAB — URINALYSIS, ROUTINE W REFLEX MICROSCOPIC
Bilirubin Urine: NEGATIVE
Glucose, UA: NEGATIVE mg/dL
Hgb urine dipstick: NEGATIVE
Ketones, ur: 5 mg/dL — AB
Nitrite: NEGATIVE
Protein, ur: 100 mg/dL — AB
Specific Gravity, Urine: 1.02 (ref 1.005–1.030)
pH: 5 (ref 5.0–8.0)

## 2022-08-08 LAB — CBC WITH DIFFERENTIAL/PLATELET
Abs Immature Granulocytes: 0.06 10*3/uL (ref 0.00–0.07)
Basophils Absolute: 0.1 10*3/uL (ref 0.0–0.1)
Basophils Relative: 0 %
Eosinophils Absolute: 0.2 10*3/uL (ref 0.0–0.5)
Eosinophils Relative: 1 %
HCT: 44.4 % (ref 36.0–46.0)
Hemoglobin: 14.7 g/dL (ref 12.0–15.0)
Immature Granulocytes: 0 %
Lymphocytes Relative: 15 %
Lymphs Abs: 2.3 10*3/uL (ref 0.7–4.0)
MCH: 30.9 pg (ref 26.0–34.0)
MCHC: 33.1 g/dL (ref 30.0–36.0)
MCV: 93.3 fL (ref 80.0–100.0)
Monocytes Absolute: 1.1 10*3/uL — ABNORMAL HIGH (ref 0.1–1.0)
Monocytes Relative: 7 %
Neutro Abs: 11.5 10*3/uL — ABNORMAL HIGH (ref 1.7–7.7)
Neutrophils Relative %: 77 %
Platelets: 201 10*3/uL (ref 150–400)
RBC: 4.76 MIL/uL (ref 3.87–5.11)
RDW: 13.8 % (ref 11.5–15.5)
WBC: 15.2 10*3/uL — ABNORMAL HIGH (ref 4.0–10.5)
nRBC: 0 % (ref 0.0–0.2)

## 2022-08-08 LAB — BLOOD GAS, ARTERIAL
Acid-Base Excess: 0.3 mmol/L (ref 0.0–2.0)
Acid-Base Excess: 0.3 mmol/L (ref 0.0–2.0)
Bicarbonate: 26.5 mmol/L (ref 20.0–28.0)
Bicarbonate: 26 mmol/L (ref 20.0–28.0)
Drawn by: 422461
Drawn by: 560031
FIO2: 100 %
O2 Content: 4 L/min
O2 Saturation: 90.4 %
O2 Saturation: 92.4 %
Patient temperature: 36.6
Patient temperature: 36.9
pCO2 arterial: 47 mmHg (ref 32–48)
pCO2 arterial: 45 mmHg (ref 32–48)
pH, Arterial: 7.36 (ref 7.35–7.45)
pH, Arterial: 7.37 (ref 7.35–7.45)
pO2, Arterial: 55 mmHg — ABNORMAL LOW (ref 83–108)
pO2, Arterial: 60 mmHg — ABNORMAL LOW (ref 83–108)

## 2022-08-08 LAB — I-STAT CHEM 8, ED
BUN: 55 mg/dL — ABNORMAL HIGH (ref 8–23)
Calcium, Ion: 1.16 mmol/L (ref 1.15–1.40)
Chloride: 96 mmol/L — ABNORMAL LOW (ref 98–111)
Creatinine, Ser: 2.6 mg/dL — ABNORMAL HIGH (ref 0.44–1.00)
Glucose, Bld: 166 mg/dL — ABNORMAL HIGH (ref 70–99)
HCT: 44 % (ref 36.0–46.0)
Hemoglobin: 15 g/dL (ref 12.0–15.0)
Potassium: 3.1 mmol/L — ABNORMAL LOW (ref 3.5–5.1)
Sodium: 134 mmol/L — ABNORMAL LOW (ref 135–145)
TCO2: 27 mmol/L (ref 22–32)

## 2022-08-08 LAB — RESP PANEL BY RT-PCR (FLU A&B, COVID) ARPGX2
Influenza A by PCR: NEGATIVE
Influenza B by PCR: NEGATIVE
SARS Coronavirus 2 by RT PCR: NEGATIVE

## 2022-08-08 LAB — CLOSTRIDIUM DIFFICILE BY PCR, REFLEXED: Toxigenic C. Difficile by PCR: POSITIVE — AB

## 2022-08-08 LAB — MRSA NEXT GEN BY PCR, NASAL: MRSA by PCR Next Gen: NOT DETECTED

## 2022-08-08 LAB — COMPREHENSIVE METABOLIC PANEL
ALT: 22 U/L (ref 0–44)
AST: 26 U/L (ref 15–41)
Albumin: 4.2 g/dL (ref 3.5–5.0)
Alkaline Phosphatase: 77 U/L (ref 38–126)
Anion gap: 11 (ref 5–15)
BUN: 62 mg/dL — ABNORMAL HIGH (ref 8–23)
CO2: 26 mmol/L (ref 22–32)
Calcium: 9.3 mg/dL (ref 8.9–10.3)
Chloride: 98 mmol/L (ref 98–111)
Creatinine, Ser: 2.52 mg/dL — ABNORMAL HIGH (ref 0.44–1.00)
GFR, Estimated: 19 mL/min — ABNORMAL LOW (ref >60)
Glucose, Bld: 172 mg/dL — ABNORMAL HIGH (ref 70–99)
Potassium: 3.3 mmol/L — ABNORMAL LOW (ref 3.5–5.1)
Sodium: 135 mmol/L (ref 135–145)
Total Bilirubin: 0.8 mg/dL (ref 0.3–1.2)
Total Protein: 7.8 g/dL (ref 6.5–8.1)

## 2022-08-08 LAB — ETHANOL: Alcohol, Ethyl (B): <10 mg/dL (ref <10)

## 2022-08-08 LAB — C DIFFICILE QUICK SCREEN W PCR REFLEX
C Diff antigen: POSITIVE — AB
C Diff toxin: NEGATIVE

## 2022-08-08 LAB — GLUCOSE, CAPILLARY
Glucose-Capillary: 117 mg/dL — ABNORMAL HIGH (ref 70–99)
Glucose-Capillary: 109 mg/dL — ABNORMAL HIGH (ref 70–99)
Glucose-Capillary: 76 mg/dL (ref 70–99)

## 2022-08-08 LAB — D-DIMER, QUANTITATIVE: D-Dimer, Quant: 2.01 ug/mL-FEU — ABNORMAL HIGH (ref 0.00–0.50)

## 2022-08-08 LAB — RAPID URINE DRUG SCREEN, HOSP PERFORMED
Amphetamines: NOT DETECTED
Barbiturates: NOT DETECTED
Benzodiazepines: NOT DETECTED
Cocaine: NOT DETECTED
Opiates: POSITIVE — AB
Tetrahydrocannabinol: NOT DETECTED

## 2022-08-08 LAB — ACETAMINOPHEN LEVEL: Acetaminophen (Tylenol), Serum: <10 ug/mL — ABNORMAL LOW (ref 10–30)

## 2022-08-08 LAB — MAGNESIUM: Magnesium: 2.9 mg/dL — ABNORMAL HIGH (ref 1.7–2.4)

## 2022-08-08 LAB — SARS CORONAVIRUS 2 BY RT PCR: SARS Coronavirus 2 by RT PCR: NOT DETECTED — AB

## 2022-08-08 MED ORDER — CIPROFLOXACIN IN D5W 400 MG/200ML IV SOLN
400.0000 mg | INTRAVENOUS | Status: DC
  Administered 2022-08-08: 400 mg via INTRAVENOUS
  Filled 2022-08-08: qty 200

## 2022-08-08 MED ORDER — PIPERACILLIN-TAZOBACTAM 3.375 G IVPB
3.3750 g | Freq: Three times a day (TID) | INTRAVENOUS | Status: DC
  Administered 2022-08-08: 3.375 g via INTRAVENOUS
  Filled 2022-08-08 (×2): qty 50

## 2022-08-08 MED ORDER — IPRATROPIUM-ALBUTEROL 0.5-2.5 (3) MG/3ML IN SOLN: 3.0000 mL | Freq: Four times a day (QID) | RESPIRATORY_TRACT | Status: DC | PRN

## 2022-08-08 MED ORDER — NALOXONE HCL 0.4 MG/ML IJ SOLN: 0.4000 mg | INTRAMUSCULAR | Status: DC | PRN

## 2022-08-08 MED ORDER — NALOXONE HCL 0.4 MG/ML IJ SOLN
INTRAMUSCULAR | Status: AC
  Administered 2022-08-08: 0.4 mg
  Filled 2022-08-08: qty 1

## 2022-08-08 MED ORDER — ROSUVASTATIN CALCIUM 10 MG PO TABS
10.0000 mg | ORAL_TABLET | Freq: Every day | ORAL | Status: DC
  Administered 2022-08-09 – 2022-08-12 (×4): 10 mg via ORAL
  Filled 2022-08-08 (×5): qty 1

## 2022-08-08 MED ORDER — POTASSIUM CHLORIDE 10 MEQ/100ML IV SOLN
10.0000 meq | INTRAVENOUS | Status: AC
  Administered 2022-08-08 (×6): 10 meq via INTRAVENOUS
  Filled 2022-08-08 (×6): qty 100

## 2022-08-08 MED ORDER — ALBUTEROL SULFATE (2.5 MG/3ML) 0.083% IN NEBU
2.5000 mg | INHALATION_SOLUTION | Freq: Once | RESPIRATORY_TRACT | Status: AC
  Administered 2022-08-08: 2.5 mg via RESPIRATORY_TRACT
  Filled 2022-08-08: qty 3

## 2022-08-08 MED ORDER — METRONIDAZOLE 500 MG/100ML IV SOLN
500.0000 mg | Freq: Three times a day (TID) | INTRAVENOUS | Status: DC
  Administered 2022-08-08 – 2022-08-09 (×2): 500 mg via INTRAVENOUS
  Filled 2022-08-08 (×2): qty 100

## 2022-08-08 MED ORDER — INSULIN ASPART 100 UNIT/ML IJ SOLN
0.0000 [IU] | Freq: Three times a day (TID) | INTRAMUSCULAR | Status: DC
  Administered 2022-08-09: 2 [IU] via SUBCUTANEOUS
  Administered 2022-08-09: 1 [IU] via SUBCUTANEOUS
  Administered 2022-08-09 – 2022-08-10 (×2): 2 [IU] via SUBCUTANEOUS
  Administered 2022-08-11: 1 [IU] via SUBCUTANEOUS

## 2022-08-08 MED ORDER — DEXTROSE-NACL 5-0.9 % IV SOLN: INTRAVENOUS | Status: DC

## 2022-08-08 MED ORDER — ACETAMINOPHEN 325 MG PO TABS: 650.0000 mg | ORAL_TABLET | Freq: Four times a day (QID) | ORAL | Status: DC | PRN

## 2022-08-08 MED ORDER — SODIUM CHLORIDE 0.9 % IV BOLUS
500.0000 mL | Freq: Once | INTRAVENOUS | Status: AC
  Administered 2022-08-08: 500 mL via INTRAVENOUS

## 2022-08-08 MED ORDER — IPRATROPIUM-ALBUTEROL 0.5-2.5 (3) MG/3ML IN SOLN
3.0000 mL | Freq: Four times a day (QID) | RESPIRATORY_TRACT | Status: DC
  Administered 2022-08-08 – 2022-08-10 (×10): 3 mL via RESPIRATORY_TRACT
  Filled 2022-08-08 (×10): qty 3

## 2022-08-08 MED ORDER — ASPIRIN 81 MG PO TBEC
81.0000 mg | DELAYED_RELEASE_TABLET | Freq: Every day | ORAL | Status: DC
  Administered 2022-08-09 – 2022-08-12 (×4): 81 mg via ORAL
  Filled 2022-08-08 (×5): qty 1

## 2022-08-08 MED ORDER — LIP MEDEX EX OINT
TOPICAL_OINTMENT | CUTANEOUS | Status: DC | PRN
  Filled 2022-08-08: qty 7

## 2022-08-08 MED ORDER — ORAL CARE MOUTH RINSE
15.0000 mL | OROMUCOSAL | Status: DC
  Administered 2022-08-08 – 2022-08-11 (×13): 15 mL via OROMUCOSAL

## 2022-08-08 MED ORDER — ORAL CARE MOUTH RINSE: 15.0000 mL | OROMUCOSAL | Status: DC | PRN

## 2022-08-08 MED ORDER — SODIUM CHLORIDE 0.9 % IV SOLN: INTRAVENOUS | Status: DC

## 2022-08-08 MED ORDER — ENOXAPARIN SODIUM 30 MG/0.3ML IJ SOSY
30.0000 mg | PREFILLED_SYRINGE | Freq: Every day | INTRAMUSCULAR | Status: DC
  Administered 2022-08-08 – 2022-08-09 (×2): 30 mg via SUBCUTANEOUS
  Filled 2022-08-08 (×2): qty 0.3

## 2022-08-08 MED ORDER — FENTANYL CITRATE PF 50 MCG/ML IJ SOSY
25.0000 ug | PREFILLED_SYRINGE | Freq: Once | INTRAMUSCULAR | Status: AC
  Administered 2022-08-08: 25 ug via INTRAVENOUS
  Filled 2022-08-08: qty 1

## 2022-08-08 MED ORDER — ACETAMINOPHEN 650 MG RE SUPP
650.0000 mg | Freq: Four times a day (QID) | RECTAL | Status: DC | PRN
  Administered 2022-08-08 (×2): 650 mg via RECTAL
  Filled 2022-08-08 (×2): qty 1

## 2022-08-08 MED ORDER — POTASSIUM CHLORIDE CRYS ER 20 MEQ PO TBCR
40.0000 meq | EXTENDED_RELEASE_TABLET | Freq: Two times a day (BID) | ORAL | Status: DC
  Filled 2022-08-08: qty 2

## 2022-08-08 MED ORDER — IPRATROPIUM-ALBUTEROL 0.5-2.5 (3) MG/3ML IN SOLN
3.0000 mL | Freq: Once | RESPIRATORY_TRACT | Status: AC
  Administered 2022-08-08: 3 mL via RESPIRATORY_TRACT
  Filled 2022-08-08: qty 3

## 2022-08-08 MED ORDER — ARFORMOTEROL TARTRATE 15 MCG/2ML IN NEBU
15.0000 ug | INHALATION_SOLUTION | Freq: Two times a day (BID) | RESPIRATORY_TRACT | Status: DC
  Administered 2022-08-08 – 2022-08-12 (×9): 15 ug via RESPIRATORY_TRACT
  Filled 2022-08-08 (×9): qty 2

## 2022-08-08 MED ORDER — TECHNETIUM TO 99M ALBUMIN AGGREGATED
4.2000 | Freq: Once | INTRAVENOUS | Status: AC
  Administered 2022-08-08: 4.2 via INTRAVENOUS

## 2022-08-08 NOTE — ED Notes (Signed)
Pt's SPO2 in 60s w/poor pleth. Fingers purple.. Placed on NRB  5, and pulse ox readjusted. Good pleth with 100% sat. O2 rate increased to 15 till 9am ABG results.

## 2022-08-08 NOTE — ED Provider Notes (Signed)
[place] DEPT Provider Note   CSN: [id number] Arrival date & time: 08/08/22  7096     History  Chief Complaint  Patient presents with   Fall   Weakness    [name] is a 76 y.o. female.  The history is provided by the EMS personnel. The history is limited by the condition of the patient.  Fall This is a new problem. The problem occurs constantly. The problem has been resolved. Nothing aggravates the symptoms. Nothing relieves the symptoms. The treatment provided no relief.  Patient with chronic pain on morphine percocet and gabapentin presents with AMS and fall.     Past Medical History:  Diagnosis Date   Asthma    Breast cancer ([place])    right breast   COPD (chronic obstructive pulmonary disease) (HCC)    Coronary artery disease    Diverticulum of esophagus    Elevated LFTs    Emphysema lung (HCC)    ETOH abuse    H/O atrial fibrillation without current medication    only one time when she had sepsis   Hyperlipidemia    Hypertension    hx of but not on any medications   Myocardial infarction ([place]) 2000   OA (osteoarthritis) of knee    Osteoarthritis    Tobacco abuse         Home Medications Prior to Admission medications   Medication Sig Start Date End Date Taking? Authorizing Provider  albuterol (PROVENTIL HFA;VENTOLIN HFA) 108 (90 BASE) MCG/ACT inhaler Inhale 2 puffs into the lungs every 6 (six) hours as needed for wheezing.     [provider]  aspirin EC 81 MG EC tablet Take 1 tablet (81 mg total) by mouth 2 (two) times daily. 06/16/19   Pokhrel, Corrie Mckusick, MD  diclofenac Sodium (VOLTAREN) 1 % GEL Apply topically. 11/30/21   [provider]  furosemide (LASIX) 20 MG tablet Take 1 tablet (20 mg total) by mouth daily as needed for edema. 12/27/19 02/24/22  Martinique, Peter M, MD  gabapentin (NEURONTIN) 600 MG tablet Take 600 mg by mouth 4 (four) times daily.     [provider]  losartan (COZAAR) 50 MG  tablet Take 1 tablet (50 mg total) by mouth daily. 06/11/22   [place], Peter M, MD  metFORMIN (GLUCOPHAGE-XR) 500 MG 24 hr tablet metformin ER 500 mg tablet,extended release 24 hr    [provider]  methocarbamol (ROBAXIN) 500 MG tablet Take 500 mg by mouth daily as needed. 05/30/20   [provider]  morphine (MS CONTIN) 30 MG 12 hr tablet Take 30 mg by mouth 4 (four) times daily.  12/23/16   [provider]  oxyCODONE (ROXICODONE) 15 MG immediate release tablet Take 15 mg by mouth 4 (four) times daily.    [provider]  rosuvastatin (CRESTOR) 10 MG tablet Take 1 tablet (10 mg total) by mouth daily. Please keep scheduled appointment 06/25/22   Martinique, Peter M, MD      Allergies    Azithromycin, Cefuroxime, Cephalexin, Chlorhexidine, Prednisone, and Zithromax [azithromycin dihydrate]    Review of Systems   Review of Systems  Unable to perform ROS: Mental status change  Constitutional:  Negative for fever.  Respiratory:  Negative for wheezing and stridor.   Cardiovascular:  Negative for leg swelling.    Physical Exam Updated Vital Signs BP 113/89   Pulse 86   Temp 97.9 F (36.6 C) (Oral)   Resp 16   SpO2 92%  Physical Exam Vitals and nursing note reviewed. Exam conducted with a chaperone present.  Constitutional:      General: She is not in acute distress.    Appearance: She is well-developed. She is not diaphoretic.  HENT:     Head: Normocephalic and atraumatic.     Nose: Nose normal.     Mouth/Throat:     Mouth: Mucous membranes are moist.  Eyes:     Pupils: Pupils are equal, round, and reactive to light.  Cardiovascular:     Rate and Rhythm: Normal rate and regular rhythm.     Pulses: Normal pulses.     Heart sounds: Normal heart sounds.  Pulmonary:     Effort: Pulmonary effort is normal. No respiratory distress.     Breath sounds: Wheezing and rhonchi present.  Abdominal:     General: Bowel sounds are normal. There is no distension.      Palpations: Abdomen is soft.     Tenderness: There is no abdominal tenderness. There is no guarding or rebound.  Genitourinary:    Vagina: No vaginal discharge.  Musculoskeletal:        General: Normal range of motion.     Cervical back: Neck supple.  Skin:    General: Skin is warm and dry.     Capillary Refill: Capillary refill takes less than 2 seconds.     Findings: No erythema or rash.  Neurological:     Deep Tendon Reflexes: Reflexes normal.  Psychiatric:        Mood and Affect: Mood normal.     ED Results / Procedures / Treatments   Labs (all labs ordered are listed, but only abnormal results are displayed) Labs Reviewed  CBC WITH DIFFERENTIAL/PLATELET - Abnormal; Notable for the following components:      Result Value   WBC 15.2 (*)    Neutro Abs 11.5 (*)    Monocytes Absolute 1.1 (*)    All other components within normal limits  COMPREHENSIVE METABOLIC PANEL - Abnormal; Notable for the following components:   Potassium 3.3 (*)    Glucose, Bld 172 (*)    BUN 62 (*)    Creatinine, Ser 2.52 (*)    GFR, Estimated 19 (*)    All other components within normal limits  URINALYSIS, ROUTINE W REFLEX MICROSCOPIC - Abnormal; Notable for the following components:   Color, Urine AMBER (*)    APPearance HAZY (*)    Ketones, ur 5 (*)    Protein, ur 100 (*)    Leukocytes,Ua TRACE (*)    Bacteria, UA FEW (*)    All other components within normal limits  RAPID URINE DRUG SCREEN, HOSP PERFORMED - Abnormal; Notable for the following components:   Opiates POSITIVE (*)    All other components within normal limits  ACETAMINOPHEN LEVEL - Abnormal; Notable for the following components:   Acetaminophen (Tylenol), Serum <10 (*)    All other components within normal limits  I-STAT CHEM 8, ED - Abnormal; Notable for the following components:   Sodium 134 (*)    Potassium 3.1 (*)    Chloride 96 (*)    BUN 55 (*)    Creatinine, Ser 2.60 (*)    Glucose, Bld 166 (*)    All other  components within normal limits  ETHANOL  BLOOD GAS, ARTERIAL    EKG None  Radiology CT CHEST ABDOMEN PELVIS WO CONTRAST  Result Date: 08/08/2022 CLINICAL DATA:  Polytrauma, blunt.  Fall. EXAM: CT CHEST, ABDOMEN AND  PELVIS WITHOUT CONTRAST TECHNIQUE: Multidetector CT imaging of the chest, abdomen and pelvis was performed following the standard protocol without IV contrast. RADIATION DOSE REDUCTION: This exam was performed according to the departmental dose-optimization program which includes automated exposure control, adjustment of the mA and/or kV according to patient size and/or use of iterative reconstruction technique. COMPARISON:  03/28/2020, 09/20/2017. FINDINGS: CT CHEST FINDINGS Cardiovascular: Heart is enlarged and there is no pericardial effusion. Multi-vessel coronary artery calcifications are noted. There is atherosclerotic calcification of the aorta without evidence of aneurysm. The pulmonary trunk is normal in caliber. Mediastinum/Nodes: No mediastinal or axillary lymphadenopathy. Surgical clips are noted in the right axilla. Evaluation of the hilar regions is limited due to lack of IV contrast. The thyroid gland and esophagus are within normal limits. Debris is noted in the distal trachea and mainstem bronchi bilaterally, greater on the right than on the left. Lungs/Pleura: Bronchial wall thickening is present bilaterally. Strandy atelectasis or scarring is noted bilaterally. Surgical changes are noted along the medial border of the left upper lobe with pleuroparenchymal scarring and possible small pneumatocele versus small pneumothorax. Paraseptal emphysematous changes. Scattered pulmonary nodules are noted in the lungs, the largest measuring 5 mm in the right upper lobe, axial image 42. Musculoskeletal: No acute fracture. CT ABDOMEN PELVIS FINDINGS Hepatobiliary: No focal liver abnormality is seen. No biliary ductal dilatation. Stones are present within the gallbladder. Pancreas:  Unremarkable. No pancreatic ductal dilatation or surrounding inflammatory changes. Spleen: No splenic injury or perisplenic hematoma. Adrenals/Urinary Tract: The left adrenal gland is within normal limits. The right adrenal gland is not definitely visualized on exam. The right kidney is pelvic in location. No renal calculus or hydronephrosis. The bladder is not well seen due to streak hardware artifact. Stomach/Bowel: Stomach is within normal limits. Appendix appears normal. No bowel obstruction, free air or pneumatosis. There is mild thickening of the walls of the distal transverse colon. 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 No abdominal or pelvic lymphadenopathy. Reproductive: Not seen due to streak artifact. Other: No abdominopelvic ascites. Musculoskeletal: Degenerative changes in the lumbar spine. Total hip arthroplasty changes are noted bilaterally. IMPRESSION: 1. Postsurgical changes in the left upper lobe with small lucency at the left lung apex, possible pneumatocele versus small pneumothorax. 2. No evidence of solid organ injury or acute fracture. 3. Colonic wall thickening involving the mid to distal transverse colon, which may be infectious or inflammatory. Colonoscopy is recommended for further evaluation on follow-up to exclude underlying neoplasm. 4. Emphysema. 5. Scattered pulmonary nodules bilaterally measuring up to 5 mm, unchanged from 2019. 6. Coronary artery calcifications and aortic atherosclerosis. Electronically Signed   By: Brett Fairy M.D.   On: 08/08/2022 05:06   CT Head Wo Contrast  Result Date: 08/08/2022 CLINICAL DATA:  Polytrauma, blunt.  Fall. EXAM: CT HEAD WITHOUT CONTRAST CT CERVICAL SPINE WITHOUT CONTRAST TECHNIQUE: Multidetector CT imaging of the head and cervical spine was performed following the standard protocol without intravenous contrast. Multiplanar CT image reconstructions of the  cervical spine were also generated. RADIATION DOSE REDUCTION: This exam was performed according to the departmental dose-optimization program which includes automated exposure control, adjustment of the mA and/or kV according to patient size and/or use of iterative reconstruction technique. COMPARISON:  08/26/2014, 11/06/2016. FINDINGS: CT HEAD FINDINGS Brain: No acute intracranial hemorrhage, midline shift or mass effect. No extra-axial fluid collection. Diffuse atrophy is noted. Periventricular white matter hypodensities are noted bilaterally. No hydrocephalus. Vascular: No hyperdense vessel  or unexpected calcification. Skull: Normal. Negative for fracture or focal lesion. Sinuses/Orbits: No acute finding. Other: None. CT CERVICAL SPINE FINDINGS Alignment: Mild anterolisthesis at C6-C7 and C7-T1. Skull base and vertebrae: No acute fracture. There is fusion of the C3 C5 vertebral bodies. Soft tissues and spinal canal: No prevertebral fluid or swelling. No visible canal hematoma. Disc levels: Intervertebral disc space narrowing and degenerative endplate changes are present at C2-C3, C5-C6, C6-C7, and C7-T1, resulting in mild-to-moderate spinal canal stenosis, most pronounced at C3-C4. Facet arthropathy is noted bilaterally. Upper chest: Emphysematous changes are present at the lung apices. Other: Carotid artery calcifications and aortic atherosclerosis. IMPRESSION: 1. No acute intracranial process. 2. Atrophy with chronic microvascular ischemic changes. 3. Multilevel degenerative changes with no evidence of acute fracture. Electronically Signed   By: Brett Fairy M.D.   On: 08/08/2022 04:46   CT Cervical Spine Wo Contrast  Result Date: 08/08/2022 CLINICAL DATA:  Polytrauma, blunt.  Fall. EXAM: CT HEAD WITHOUT CONTRAST CT CERVICAL SPINE WITHOUT CONTRAST TECHNIQUE: Multidetector CT imaging of the head and cervical spine was performed following the standard protocol without intravenous contrast. Multiplanar CT  image reconstructions of the cervical spine were also generated. RADIATION DOSE REDUCTION: This exam was performed according to the departmental dose-optimization program which includes automated exposure control, adjustment of the mA and/or kV according to patient size and/or use of iterative reconstruction technique. COMPARISON:  08/26/2014, 11/06/2016. FINDINGS: CT HEAD FINDINGS Brain: No acute intracranial hemorrhage, midline shift or mass effect. No extra-axial fluid collection. Diffuse atrophy is noted. Periventricular white matter hypodensities are noted bilaterally. No hydrocephalus. Vascular: No hyperdense vessel or unexpected calcification. Skull: Normal. Negative for fracture or focal lesion. Sinuses/Orbits: No acute finding. Other: None. CT CERVICAL SPINE FINDINGS Alignment: Mild anterolisthesis at C6-C7 and C7-T1. Skull base and vertebrae: No acute fracture. There is fusion of the C3 C5 vertebral bodies. Soft tissues and spinal canal: No prevertebral fluid or swelling. No visible canal hematoma. Disc levels: Intervertebral disc space narrowing and degenerative endplate changes are present at C2-C3, C5-C6, C6-C7, and C7-T1, resulting in mild-to-moderate spinal canal stenosis, most pronounced at C3-C4. Facet arthropathy is noted bilaterally. Upper chest: Emphysematous changes are present at the lung apices. Other: Carotid artery calcifications and aortic atherosclerosis. IMPRESSION: 1. No acute intracranial process. 2. Atrophy with chronic microvascular ischemic changes. 3. Multilevel degenerative changes with no evidence of acute fracture. Electronically Signed   By: Brett Fairy M.D.   On: 08/08/2022 04:46   DG Chest Portable 1 View  Result Date: 08/08/2022 CLINICAL DATA:  Fall, weakness. EXAM: PORTABLE CHEST 1 VIEW COMPARISON:  04/01/2018. FINDINGS: Cardiac silhouette is enlarged. Atherosclerotic calcification of the aorta is noted. Surgical changes are noted in the left lung with reduced lung  volume and mediastinal shift to the left. No consolidation, effusion, or pneumothorax. Surgical clips are present over the right chest. No acute osseous abnormality. IMPRESSION: 1. No active disease. 2. Postsurgical changes in the right lung with reduced lung volume and mediastinal shift to the left. 3. Mild cardiomegaly. Electronically Signed   By: Brett Fairy M.D.   On: 08/08/2022 03:20    Procedures Procedures    Medications Ordered in ED Medications  0.9 %  sodium chloride infusion ( Intravenous New Bag/Given 08/08/22 0553)  sodium chloride 0.9 % bolus 500 mL (0 mLs Intravenous Stopped 08/08/22 0505)  sodium chloride 0.9 % bolus 500 mL (500 mLs Intravenous New Bag/Given 08/08/22 0538)  albuterol (PROVENTIL) (2.5 MG/3ML) 0.083% nebulizer solution 2.5 mg (2.5 mg  Nebulization Given 08/08/22 0553)    ED Course/ Medical Decision Making/ A&P                           Medical Decision Making Patient narcotics for pain presents with AMS and fall   Problems Addressed: AKI (acute kidney injury) [facility]):    Details: Hydration and admission  Altered mental status, unspecified altered mental status type:    Details: Likely due to polypharmacy Chronic obstructive pulmonary disease, unspecified COPD type ([place]):    Details: Albuterol given   Amount and/or Complexity of Data Reviewed Independent Historian: EMS    Details: See above  External Data Reviewed: notes.    Details: Previous notes reviewed  Labs: ordered.    Details: All labs reviewed: uds positive for opiates, negative ETOH.  Elevated white count 15.2, normal hemoglobin and platelet count. Normal sodium potassium slight low 3.1, elevated creatinine 2.52.  urine with trace leuk esterase.   Radiology: ordered and independent interpretation performed.    Details: No traumatic injury of the head or c spine on CT  Risk Prescription drug management. Decision regarding hospitalization.    Final Clinical Impression(s) / ED  Diagnoses Final diagnoses:  Fall, initial encounter  AKI (acute kidney injury) ([place])  Altered mental status, unspecified altered mental status type  Chronic obstructive pulmonary disease, unspecified COPD type ([place])  Pneumatocele of lung   The patient appears reasonably stabilized for admission considering the current resources, flow, and capabilities available in the ED at this time, and I doubt any other [facility] requiring further screening and/or treatment in the ED prior to admission.   Rx / DC Orders ED Discharge Orders     None         Fredi Hurtado, MD 08/08/22 [phone]

## 2022-08-08 NOTE — Progress Notes (Signed)
Dr. Marylyn Ishihara wanted to try patient on BIPAP, but then at bedside post speaking to Dr. Carilyn Goodpasture changed his mind and let patient stay on 100% NRB with saturation 96%. RT will continue to monitor

## 2022-08-08 NOTE — Progress Notes (Signed)
Notified that patient is C diff positive. Still not reliable for PO intake (mentation). Change abx to flagyl.   Jonnie Finner, DO

## 2022-08-08 NOTE — Progress Notes (Addendum)
Informed Dr. Marylyn Ishihara that patient has an increased fever of 102.5 axillary after receiving tylenol suppository. Applied ice packs and removed top sheet.   Called lab to inquire about why the SARS result is highlighted *Abnormal* and that we cannot see the results of any Flu test.   Per laboratory the order was incorrect in the system (a quad SARS was ordered instead) but both SARS and Flu were tested and were NEGATIVE, but they will fix it in the system.

## 2022-08-08 NOTE — Progress Notes (Signed)
Discussed necessity for enteric/c-diff testing. Also informed Dr. Marylyn Ishihara about patients inablilty to urinate/bladder scan >500 mL, and increasing edema in bilateral lower extremities (currently NS gtt running at 125 mL/hr).   Per Dr. Marylyn Ishihara a verbal order to stop NS and continue to monitor patient for decreased UOP/BP changes, increased/decreased edema. Verbal order for indwelling foley catheter. Per the patients husband, the patient experienced liquid diarrhea at home, collect c-diff and keep enteric precaution.

## 2022-08-08 NOTE — Progress Notes (Signed)
RT NOTE:  ABG obtained '@0906'$  and sent to lab, lab notified '@0908'$ .

## 2022-08-08 NOTE — Progress Notes (Signed)
[place] standby. Pt is on [place] and tolerating it well.

## 2022-08-08 NOTE — Progress Notes (Addendum)
Attempted to give patient some water to assess swallowing. Patient had a difficult time understanding instructions and aspirated immediately on the water. Placed patient on NPO for diet until she is able to swallow appropriately. Patient will not be able to swallow her scheduled pills. Informed Dr. Marylyn Ishihara. Requested PO potassium to be switched to IV and switch the precautions to droplet in the meantime (rule out COVID/Flu and current precautions are airborne/contact.   See new orders

## 2022-08-08 NOTE — Progress Notes (Signed)
RT NOTE:  RT called down for placement of BiPAP, Hospitalist at bedside stated to hold off on BiPAP at this time and get a repeat ABG around 0900. BiPAP at pt bedside.

## 2022-08-08 NOTE — Progress Notes (Signed)
RT placed patient on Heated High Flow @ 30L/ 50 %     Patient appears to be tolerating well atr this time with saturation @ 92-93%  RT will continue to montior

## 2022-08-08 NOTE — Consult Note (Signed)
NAME:  [name], MRN:  [id number], DOB:  08/07/1946, LOS: 0 ADMISSION DATE:  08/08/2022, CONSULTATION DATE:  08/08/22 REFERRING MD:  Margie Ege, MD CHIEF COMPLAINT:  Hypoxemia and altered mental status   History of Present Illness:  76 year old active smoker with chronic pain on morphine, percocet and gabapentin who presents for multiple falls and weakness. Unable to obtain history from patient due to decreased responsiveness. No family at bedside. History obtained from chart. Admitted to [facility] however patient with hypoxemia and on NRB. ABG with no hypercarbia and pO2 ranging 55-60. PCCM consulted for hypoxemia and altered mental status.  Patient drowsy but answers simple questions with 1-2 word responses appropriately. Not opening eyes, but following some commands. Has pinpoint pupils. Given narcan 0.4 mg x 1 with increased alertness, answering questions in full sentences, complaining about feeling cold and following all commands. Has good cough with rhonchi bilaterally in lungs.   Pertinent  Medical History  Chronic pain, CAD s/p PCI in 1990s and 2000, atrial fibrillation without recurrence not on anticoagulation, mitral regurgitation, emphysema, HTN, HLD, DM2 on metformin, hx right breast cancer, hx ETOH abuse, raynauds disease, severe kyphoscoliosis, cervical discitis, spinal stenosis  Significant Hospital Events: Including procedures, antibiotic start and stop dates in addition to other pertinent events   11/24 Admitted to [facility]. Given Narcan with increased alertness  Interim History / Subjective:  As above  Objective   Blood pressure (!) 207/154, pulse 89, temperature (!) 100.7 F (38.2 C), temperature source Oral, resp. rate (!) 24, height 5\' 5"  (1.651 m), weight 56.6 kg, SpO2 95 %.       No intake or output data in the 24 hours ending 08/08/22 1052 Filed Weights   08/08/22 1000  Weight: 56.6 kg    Physical Exam: General: Elderly chronically ill-appearing, obtunded,  kyphoscoliosis HENT: [place], AT, OP clear, MMM Eyes: Pinpoint pupils, EOMI, no scleral icterus Respiratory: Bilateral rhonchi bilaterally.  No crackles, wheezing or rales Cardiovascular: RRR, -M/R/G, no JVD GI: BS+, soft, nontender Extremities: Purplish fingers from Raynauds, -Edema,-tenderness Neuro: Obtunded. After narcan, AAO x2, CNII-XII grossly intact, moves extremities x 4, follows commands   Resolved Hospital Problem list   N/A  Assessment & Plan:  Acute toxic encephalopathy 2/2 opioid overuse CT head neg -S/p narcan 0.4 mg x 1. Repeat if needed however caution with use in chronic opioid user ongoing pain control may be difficult -Supportive care  Acute hypoxemic respiratory failure 2/2 above COPD exacerbation -Transition from NRB to HFNC. Currently on FIO2 50% 30L -Poor candidate for BiPAP until she is more alert -Primary team ordered V/Q scan. Recommend hold on anticoagulation until results -Start Brovana and Duonebs q6h -Chest PT -Zosyn should cover pneumonia. De-escalate when able -Allergy to steroids  Severe kyphoscoliosis with severe back pain Cervical discitis, spinal stenosis On chronic pain meds -Hold home ms  contin, oxycodone and gabapentin  -Will need PRNs when more alert  CAD s/p PCI in 1990s and 2000 Mitral regurgitation HTN, HLD -Telemetry  Falls, syncope, weakness CT head and C-spine neg -Work-up per primary team -NS @ 125cc/hr gtt  Colitis -Zosyn per primary team  DM2  Raynauds disease   Best Practice (right click and "Reselect all SmartList Selections" daily)   Diet/type: NPO DVT prophylaxis: LMWH GI prophylaxis: N/A Lines: N/A Foley:  N/A Code Status:  full code Last date of multidisciplinary goals of care discussion [11/24] Confirmed with husband by primary team  Labs   CBC: Recent Labs  Lab 08/08/22  [id number] 08/08/22 0339  WBC 15.2*  --   NEUTROABS 11.5*  --   HGB 14.7 15.0  HCT 44.4 44.0  MCV 93.3  --   PLT 201  --      Basic Metabolic Panel: Recent Labs  Lab 08/08/22 0316 08/08/22 0339  NA 135 134*  K 3.3* 3.1*  CL 98 96*  CO2 26  --   GLUCOSE 172* 166*  BUN 62* 55*  CREATININE 2.52* 2.60*  CALCIUM 9.3  --    GFR: Estimated Creatinine Clearance: 17 mL/min (A) (by C-G formula based on SCr of 2.6 mg/dL (H)). Recent Labs  Lab 08/08/22 0316  WBC 15.2*    Liver Function Tests: Recent Labs  Lab 08/08/22 0316  AST 26  ALT 22  ALKPHOS 77  BILITOT 0.8  PROT 7.8  ALBUMIN 4.2   No results for input(s): "LIPASE", "AMYLASE" in the last 168 hours. No results for input(s): "AMMONIA" in the last 168 hours.  ABG    Component Value Date/Time   PHART 7.37 08/08/2022 0900   PCO2ART 45 08/08/2022 0900   PO2ART 60 (L) 08/08/2022 0900   HCO3 26.0 08/08/2022 0900   TCO2 27 08/08/2022 0339   O2SAT 92.4 08/08/2022 0900     Coagulation Profile: No results for input(s): "INR", "PROTIME" in the last 168 hours.  Cardiac Enzymes: No results for input(s): "CKTOTAL", "CKMB", "CKMBINDEX", "TROPONINI" in the last 168 hours.  HbA1C: Hgb A1c MFr Bld  Date/Time Value Ref Range Status  11/10/2016 01:30 PM 5.8 (H) 4.8 - 5.6 % Final    Comment:    (NOTE)         Pre-diabetes: 5.7 - 6.4         Diabetes: >6.4         Glycemic control for adults with diabetes: <7.0   10/15/2015 10:50 AM 6.4 (H) 4.8 - 5.6 % Final    Comment:    (NOTE)         Pre-diabetes: 5.7 - 6.4         Diabetes: >6.4         Glycemic control for adults with diabetes: <7.0     CBG: No results for input(s): "GLUCAP" in the last 168 hours.  Review of Systems:   ROS Unable to obtain due to critical illness  Past Medical History:  She,  has a past medical history of Asthma, Breast cancer (HCC), COPD (chronic obstructive pulmonary disease) (HCC), Coronary artery disease, Diverticulum of esophagus, Elevated LFTs, Emphysema lung (HCC), ETOH abuse, H/O atrial fibrillation without current medication, Hyperlipidemia,  Hypertension, Myocardial infarction (HCC) (2000), OA (osteoarthritis) of knee, Osteoarthritis, and Tobacco abuse.   Surgical History:   Past Surgical History:  Procedure Laterality Date   ANTERIOR HIP REVISION Right 10/22/2015   Procedure: RIGHT  HIP REVISION;  Surgeon: Durene Romans, MD;  Location: [facility];  Service: Orthopedics;  Laterality: Right;   APPLICATION OF WOUND VAC N/A 06/20/2015   Procedure: APPLICATION OF INCISIONAL WOUND VAC;  Surgeon: Venita Lick, MD;  Location: [facility] OR;  Service: Orthopedics;  Laterality: N/A;   BREAST SURGERY  1991   right mastectomy   CARDIAC CATHETERIZATION  04/05/2009   EF 60%   CARDIOVASCULAR STRESS TEST  01/31/2005   EF 58%   CESAREAN SECTION  '78, '80, '81   x 3   CORONARY ANGIOPLASTY  08/1998   x2 OF A BIFURCATION OM-1, OM-2 LESION   CORONARY ANGIOPLASTY WITH STENT PLACEMENT  01/1999   MID FIRST  OBTUSE MARGINAL VESSEL   CORONARY ANGIOPLASTY WITH STENT PLACEMENT  07/1999   STENTING AT THE CRUX OF THE RIGHT CORONARY ARTERY WITH A 3.8MM X TETRA STENT   DIRECT LARYNGOSCOPY N/A 05/03/2015   Procedure: DIRECT LARYNGOSCOPY;  Surgeon: Flo Shanks, MD;  Location: [facility] OR;  Service: ENT;  Laterality: N/A;   EYE SURGERY  05/18/2014,06/01/2014   BILATERAL CATARACT S WITH LENS IMPLANTS   GASTROSTOMY N/A 05/04/2015   Procedure: OPEN GASTROSTOMY WITH TUBE PLACEMENT;  Surgeon: Manus Rudd, MD;  Location: [facility];  Service: General;  Laterality: N/A;   GASTROSTOMY N/A 11/13/2016   Procedure: INSERTION OF GASTROSTOMY TUBE;  Surgeon: Abigail Miyamoto, MD;  Location: [facility] OR;  Service: General;  Laterality: N/A;   HARDWARE REMOVAL N/A 05/03/2015   Procedure: HARDWARE REMOVAL;  Surgeon: Venita Lick, MD;  Location: [facility];  Service: Orthopedics;  Laterality: N/A;   HIP CLOSED REDUCTION Right 04/26/2015   Procedure: CLOSED REDUCTION HIP;  Surgeon: Venita Lick, MD;  Location: [facility];  Service: Orthopedics;  Laterality: Right;   HYSTEROSCOPY     D & C   INCISION AND  DRAINAGE ABSCESS N/A 05/03/2015   Procedure: INCISION AND DRAINAGE CERVICAL  ABSCESS AND REMOVAL OF HARDWARE;  Surgeon: Venita Lick, MD;  Location: [facility];  Service: Orthopedics;  Laterality: N/A;   IR CM INJ ANY COLONIC TUBE W/FLUORO  10/14/2017   IR CM INJ ANY COLONIC TUBE W/FLUORO  10/23/2017   IR CM INJ ANY COLONIC TUBE W/FLUORO  10/28/2017   IR REPLC GASTRO/COLONIC TUBE PERCUT W/FLUORO  09/22/2017   JOINT REPLACEMENT  08/2011   bilateral hip   JOINT REPLACEMENT  01/2012   right hip   MASTECTOMY     neck fusion  2011   ORIF FEMUR FRACTURE Right 06/14/2019   Procedure: ORIF PERI PROSTHETIC FEMUR FRACTURE;  Surgeon: Durene Romans, MD;  Location: [facility];  Service: Orthopedics;  Laterality: Right;   PELVIC LAPAROSCOPY  2002   RSO-     RADICAL NECK DISSECTION N/A 11/08/2016   Procedure: INCISION AND DRAINAGE OF NECK ABSCESS;  Surgeon: Osborn Coho, MD;  Location: [facility] OR;  Service: ENT;  Laterality: N/A;   RADIOLOGY WITH ANESTHESIA Right 06/28/2015   Procedure: MRI OF CERVICAL SPINE  AND RIGHT HIP  WITH AND WITHOUT CONTRAST    (RADIOLOGY WITH ANESTHESIA);  Surgeon: Medication Radiologist, MD;  Location: [facility] OR;  Service: Radiology;  Laterality: Right;   REMOVAL OF GASTROSTOMY TUBE N/A 11/14/2016   Procedure: REMOVAL OF GASTROSTOMY TUBE W/ REPLACEMENT OF GASTROSTOMY TUBE;  Surgeon: Abigail Miyamoto, MD;  Location: [facility] OR;  Service: General;  Laterality: N/A;   RIGID ESOPHAGOSCOPY N/A 05/03/2015   Procedure: RIGID ESOPHAGOSCOPY;  Surgeon: Flo Shanks, MD;  Location: [facility] OR;  Service: ENT;  Laterality: N/A;   TONSILLECTOMY AND ADENOIDECTOMY     TOTAL HIP ARTHROPLASTY  08/2010   bilat   VULVECTOMY  1981   partial     Social History:   reports that she has quit smoking. Her smoking use included cigarettes. She has a 50.00 pack-year smoking history. She has never used smokeless tobacco. She reports that she does not drink alcohol and does not use drugs.   Family History:  Her family history includes  Diabetes in her mother; Heart attack in her father; Heart disease in her father; Hypertension in her father; Stroke in her father.   Allergies Allergies  Allergen Reactions   Azithromycin Other (See Comments)   Cefuroxime Other (See Comments)  Cephalexin Other (See Comments)    Took off first layer of skin inside of mouth   Chlorhexidine    Prednisone    Zithromax [Azithromycin Dihydrate] Other (See Comments)    ORAL ULCERS      Home Medications  Prior to Admission medications   Medication Sig Start Date End Date Taking? Authorizing Provider  albuterol (PROVENTIL HFA;VENTOLIN HFA) 108 (90 BASE) MCG/ACT inhaler Inhale 2 puffs into the lungs every 6 (six) hours as needed for wheezing.    Yes [provider]  ascorbic acid (VITAMIN C) 500 MG tablet Take 500 mg by mouth daily.   Yes [provider]  aspirin EC 81 MG EC tablet Take 1 tablet (81 mg total) by mouth 2 (two) times daily. Patient taking differently: Take 81 mg by mouth daily. 06/16/19  Yes Pokhrel, Laxman, MD  BIOTIN EXTRA STRENGTH PO Take 500 mg by mouth daily.   Yes [provider]  cholecalciferol (VITAMIN D3) 25 MCG (1000 UNIT) tablet Take 1,000 Units by mouth daily.   Yes [provider]  furosemide (LASIX) 20 MG tablet Take 1 tablet (20 mg total) by mouth daily as needed for edema. 12/27/19 08/08/22 Yes [place], Peter M, MD  gabapentin (NEURONTIN) 600 MG tablet Take 600 mg by mouth 4 (four) times daily.    Yes [provider]  losartan (COZAAR) 50 MG tablet Take 1 tablet (50 mg total) by mouth daily. 06/11/22  Yes [place], Peter M, MD  metFORMIN (GLUCOPHAGE-XR) 500 MG 24 hr tablet Take 500 mg by mouth every evening.   Yes [provider]  morphine (MS CONTIN) 30 MG 12 hr tablet Take 30 mg by mouth 4 (four) times daily.  12/23/16  Yes [provider]  Multiple Vitamin (MULTIVITAMIN) capsule Take 1 capsule by mouth daily. Centrum silver   Yes [provider]   oxyCODONE (ROXICODONE) 15 MG immediate release tablet Take 15 mg by mouth 4 (four) times daily.   Yes [provider]  rosuvastatin (CRESTOR) 10 MG tablet Take 1 tablet (10 mg total) by mouth daily. Please keep scheduled appointment 06/25/22  Yes [place], Peter M, MD     Critical care time: 30 min    IThe patient is critically ill with multiple organ systems failure including altered mental status and AHRF and requires high complexity decision making for assessment and support, frequent evaluation and titration of therapies, application of advanced monitoring technologies and extensive interpretation of multiple databases.    Mechele Collin, M.D. [facility] Pulmonary/Critical Care Medicine 08/08/2022 11:41 AM   Please see Amion for pager number to reach on-call Pulmonary and Critical Care Team.

## 2022-08-08 NOTE — H&P (Addendum)
History and Physical    Patient: [name] OZH:[id number] DOB: 11/29/1945 DOA: 08/08/2022 DOS: the patient was seen and examined on 08/08/2022 PCP: Ileana Ladd, MD (Inactive)  Patient coming from: Home  Chief Complaint:  Chief Complaint  Patient presents with   Fall   Weakness   HPI: [name] is a 76 y.o. female with medical history significant of chronic pain, CAD, HTN, HLD, breast CA, COPD. Presenting with falls and weakness. She give limited history as she is confused. Chart review is limited d/t limited documentation in ED. There is no family available for comment at this time. Apparently, she had a fall this morning. It is unclear if she hit her head or had LOC. She was brought to the ED for evaluation.   UPDATE: was able to connect with husband. He states she's had several falls over the last few days. She has become more confused during this time. Yesterday, she got out of her recliner and it flipped on her. She ended up hitting her head on a table, but she did not pass out. With these falls, she is not having any chest pain, palpitations, lightheadedness, or dizziness. He notes that she has not had much of an appetite. He does note that she has had some episodes where she will "boom, just be out for a while" and wake up and act like nothing has happened. No sick contacts. He reports that she had some diarrhea last night. He is not aware of any other diarrhea before them.   Review of Systems: unable to review all systems due to the inability of the patient to answer questions due to confusion. Past Medical History:  Diagnosis Date   Asthma    Breast cancer (HCC)    right breast   COPD (chronic obstructive pulmonary disease) (HCC)    Coronary artery disease    Diverticulum of esophagus    Elevated LFTs    Emphysema lung (HCC)    ETOH abuse    H/O atrial fibrillation without current medication    only one time when she had sepsis   Hyperlipidemia    Hypertension     hx of but not on any medications   Myocardial infarction (HCC) 2000   OA (osteoarthritis) of knee    Osteoarthritis    Tobacco abuse    Past Surgical History:  Procedure Laterality Date   ANTERIOR HIP REVISION Right 10/22/2015   Procedure: RIGHT  HIP REVISION;  Surgeon: Durene Romans, MD;  Location: [facility];  Service: Orthopedics;  Laterality: Right;   APPLICATION OF WOUND VAC N/A 06/20/2015   Procedure: APPLICATION OF INCISIONAL WOUND VAC;  Surgeon: Venita Lick, MD;  Location: [facility] OR;  Service: Orthopedics;  Laterality: N/A;   BREAST SURGERY  1991   right mastectomy   CARDIAC CATHETERIZATION  04/05/2009   EF 60%   CARDIOVASCULAR STRESS TEST  01/31/2005   EF 58%   CESAREAN SECTION  '78, '80, '81   x 3   CORONARY ANGIOPLASTY  08/1998   x2 OF A BIFURCATION OM-1, OM-2 LESION   CORONARY ANGIOPLASTY WITH STENT PLACEMENT  01/1999   MID FIRST OBTUSE MARGINAL VESSEL   CORONARY ANGIOPLASTY WITH STENT PLACEMENT  07/1999   STENTING AT THE CRUX OF THE RIGHT CORONARY ARTERY WITH A 3.8MM X TETRA STENT   DIRECT LARYNGOSCOPY N/A 05/03/2015   Procedure: DIRECT LARYNGOSCOPY;  Surgeon: Flo Shanks, MD;  [facility];  Service: ENT;  Laterality: N/A;   EYE  SURGERY  05/18/2014,06/01/2014   BILATERAL CATARACT S WITH LENS IMPLANTS   GASTROSTOMY N/A 05/04/2015   Procedure: OPEN GASTROSTOMY WITH TUBE PLACEMENT;  Surgeon: Manus Rudd, MD;  Location: [facility];  Service: General;  Laterality: N/A;   GASTROSTOMY N/A 11/13/2016   Procedure: INSERTION OF GASTROSTOMY TUBE;  Surgeon: Abigail Miyamoto, MD;  Location: [facility];  Service: General;  Laterality: N/A;   HARDWARE REMOVAL N/A 05/03/2015   Procedure: HARDWARE REMOVAL;  Surgeon: Venita Lick, MD;  Location: [facility];  Service: Orthopedics;  Laterality: N/A;   HIP CLOSED REDUCTION Right 04/26/2015   Procedure: CLOSED REDUCTION HIP;  Surgeon: Venita Lick, MD;  Location: [facility];  Service: Orthopedics;  Laterality: Right;   HYSTEROSCOPY     D & C   INCISION AND  DRAINAGE ABSCESS N/A 05/03/2015   Procedure: INCISION AND DRAINAGE CERVICAL  ABSCESS AND REMOVAL OF HARDWARE;  Surgeon: Venita Lick, MD;  Location: [facility];  Service: Orthopedics;  Laterality: N/A;   IR CM INJ ANY COLONIC TUBE W/FLUORO  10/14/2017   IR CM INJ ANY COLONIC TUBE W/FLUORO  10/23/2017   IR CM INJ ANY COLONIC TUBE W/FLUORO  10/28/2017   IR REPLC GASTRO/COLONIC TUBE PERCUT W/FLUORO  09/22/2017   JOINT REPLACEMENT  08/2011   bilateral hip   JOINT REPLACEMENT  01/2012   right hip   MASTECTOMY     neck fusion  2011   ORIF FEMUR FRACTURE Right 06/14/2019   Procedure: ORIF PERI PROSTHETIC FEMUR FRACTURE;  Surgeon: Durene Romans, MD;  Location: [facility] OR;  Service: Orthopedics;  Laterality: Right;   PELVIC LAPAROSCOPY  2002   RSO-     RADICAL NECK DISSECTION N/A 11/08/2016   Procedure: INCISION AND DRAINAGE OF NECK ABSCESS;  Surgeon: Osborn Coho, MD;  Location: [facility] OR;  Service: ENT;  Laterality: N/A;   RADIOLOGY WITH ANESTHESIA Right 06/28/2015   Procedure: MRI OF CERVICAL SPINE  AND RIGHT HIP  WITH AND WITHOUT CONTRAST    (RADIOLOGY WITH ANESTHESIA);  Surgeon: Medication Radiologist, MD;  Location: [facility];  Service: Radiology;  Laterality: Right;   REMOVAL OF GASTROSTOMY TUBE N/A 11/14/2016   Procedure: REMOVAL OF GASTROSTOMY TUBE W/ REPLACEMENT OF GASTROSTOMY TUBE;  Surgeon: Abigail Miyamoto, MD;  Location: [facility] OR;  Service: General;  Laterality: N/A;   RIGID ESOPHAGOSCOPY N/A 05/03/2015   Procedure: RIGID ESOPHAGOSCOPY;  Surgeon: Flo Shanks, MD;  Location: [facility] OR;  Service: ENT;  Laterality: N/A;   TONSILLECTOMY AND ADENOIDECTOMY     TOTAL HIP ARTHROPLASTY  08/2010   bilat   VULVECTOMY  1981   partial   Social History:  reports that she has quit smoking. Her smoking use included cigarettes. She has a 50.00 pack-year smoking history. She has never used smokeless tobacco. She reports that she does not drink alcohol and does not use drugs.  Allergies  Allergen Reactions   Azithromycin Other  (See Comments)   Cefuroxime Other (See Comments)   Cephalexin Other (See Comments)    Took off first layer of skin inside of mouth   Chlorhexidine    Prednisone    Zithromax [Azithromycin Dihydrate] Other (See Comments)    ORAL ULCERS     Family History  Problem Relation Age of Onset   Diabetes Mother    Hypertension Father    Heart disease Father    Heart attack Father    Stroke Father     Prior to Admission medications   Medication Sig Start Date End Date Taking? Authorizing Provider  albuterol (PROVENTIL HFA;VENTOLIN HFA) 108 (90 BASE) MCG/ACT inhaler Inhale 2 puffs into the lungs every 6 (six) hours as needed for wheezing.     [provider]  aspirin EC 81 MG EC tablet Take 1 tablet (81 mg total) by mouth 2 (two) times daily. 06/16/19   Pokhrel, Rebekah Chesterfield, MD  diclofenac Sodium (VOLTAREN) 1 % GEL Apply topically. 11/30/21   [provider]  furosemide (LASIX) 20 MG tablet Take 1 tablet (20 mg total) by mouth daily as needed for edema. 12/27/19 02/24/22  [place], Peter M, MD  gabapentin (NEURONTIN) 600 MG tablet Take 600 mg by mouth 4 (four) times daily.     [provider]  losartan (COZAAR) 50 MG tablet Take 1 tablet (50 mg total) by mouth daily. 06/11/22   [place], Peter M, MD  metFORMIN (GLUCOPHAGE-XR) 500 MG 24 hr tablet metformin ER 500 mg tablet,extended release 24 hr    [provider]  methocarbamol (ROBAXIN) 500 MG tablet Take 500 mg by mouth daily as needed. 05/30/20   [provider]  morphine (MS CONTIN) 30 MG 12 hr tablet Take 30 mg by mouth 4 (four) times daily.  12/23/16   [provider]  oxyCODONE (ROXICODONE) 15 MG immediate release tablet Take 15 mg by mouth 4 (four) times daily.    [provider]  rosuvastatin (CRESTOR) 10 MG tablet Take 1 tablet (10 mg total) by mouth daily. Please keep scheduled appointment 06/25/22   [place], Peter M, MD    Physical Exam: Vitals:   08/08/22 0257 08/08/22 0545 08/08/22  0701  BP: 93/82 113/89   Pulse: 75 86   Resp: 15 16   Temp: 97.9 F (36.6 C)  97.8 F (36.6 C)  TempSrc: Oral  Oral  SpO2: 93% 92%    General: 76 y.o. female resting in bed in NAD Eyes: PERRL, normal sclera ENMT: Nares patent w/o discharge, orophaynx clear, dentition normal, ears w/o discharge/lesions/ulcers Neck: Supple, trachea midline Cardiovascular: RRR, +S1, S2, no m/g/r, equal pulses throughout Respiratory: course, scattered rhonchi, increased WOB on 5L GI: BS+, NDNT, no masses noted, no organomegaly noted MSK: No e/c/c Skin: No rashes, bruises, ulcerations noted Neuro: A&O x 3, no focal deficit Psyc: although A&O x 3, she is still quite confused, she is calm and cooperative  Data Reviewed:  Results for orders placed or performed during the hospital encounter of 08/08/22 (from the past 24 hour(s))  Urinalysis, Routine w reflex microscopic Urine, In & Out Cath     Status: Abnormal   Collection Time: 08/08/22  3:00 AM  Result Value Ref Range   Color, Urine AMBER (A) YELLOW   APPearance HAZY (A) CLEAR   Specific Gravity, Urine 1.020 1.005 - 1.030   pH 5.0 5.0 - 8.0   Glucose, UA NEGATIVE NEGATIVE mg/dL   Hgb urine dipstick NEGATIVE NEGATIVE   Bilirubin Urine NEGATIVE NEGATIVE   Ketones, ur 5 (A) NEGATIVE mg/dL   Protein, ur 161 (A) NEGATIVE mg/dL   Nitrite NEGATIVE NEGATIVE   Leukocytes,Ua TRACE (A) NEGATIVE   RBC / HPF 0-5 0 - 5 RBC/hpf   WBC, UA 6-10 0 - 5 WBC/hpf   Bacteria, UA FEW (A) NONE SEEN   Squamous Epithelial / LPF 0-5 0 - 5   WBC Clumps PRESENT    Mucus PRESENT    Hyaline Casts, UA PRESENT   Rapid urine drug screen (hospital performed)     Status: Abnormal   Collection Time: 08/08/22  3:00 AM  Result  Value Ref Range   Opiates POSITIVE (A) NONE DETECTED   Cocaine NONE DETECTED NONE DETECTED   Benzodiazepines NONE DETECTED NONE DETECTED   Amphetamines NONE DETECTED NONE DETECTED   Tetrahydrocannabinol NONE DETECTED NONE DETECTED   Barbiturates NONE  DETECTED NONE DETECTED  CBC with Differential     Status: Abnormal   Collection Time: 08/08/22  3:16 AM  Result Value Ref Range   WBC 15.2 (H) 4.0 - 10.5 K/uL   RBC 4.76 3.87 - 5.11 MIL/uL   Hemoglobin 14.7 12.0 - 15.0 g/dL   HCT 34.7 42.5 - 95.6 %   MCV 93.3 80.0 - 100.0 fL   MCH 30.9 26.0 - 34.0 pg   MCHC 33.1 30.0 - 36.0 g/dL   RDW 38.7 56.4 - 33.2 %   Platelets 201 150 - 400 K/uL   nRBC 0.0 0.0 - 0.2 %   Neutrophils Relative % 77 %   Neutro Abs 11.5 (H) 1.7 - 7.7 K/uL   Lymphocytes Relative 15 %   Lymphs Abs 2.3 0.7 - 4.0 K/uL   Monocytes Relative 7 %   Monocytes Absolute 1.1 (H) 0.1 - 1.0 K/uL   Eosinophils Relative 1 %   Eosinophils Absolute 0.2 0.0 - 0.5 K/uL   Basophils Relative 0 %   Basophils Absolute 0.1 0.0 - 0.1 K/uL   Immature Granulocytes 0 %   Abs Immature Granulocytes 0.06 0.00 - 0.07 K/uL  Comprehensive metabolic panel     Status: Abnormal   Collection Time: 08/08/22  3:16 AM  Result Value Ref Range   Sodium 135 135 - 145 mmol/L   Potassium 3.3 (L) 3.5 - 5.1 mmol/L   Chloride 98 98 - 111 mmol/L   CO2 26 22 - 32 mmol/L   Glucose, Bld 172 (H) 70 - 99 mg/dL   BUN 62 (H) 8 - 23 mg/dL   Creatinine, Ser 9.51 (H) 0.44 - 1.00 mg/dL   Calcium 9.3 8.9 - 88.4 mg/dL   Total Protein 7.8 6.5 - 8.1 g/dL   Albumin 4.2 3.5 - 5.0 g/dL   AST 26 15 - 41 U/L   ALT 22 0 - 44 U/L   Alkaline Phosphatase 77 38 - 126 U/L   Total Bilirubin 0.8 0.3 - 1.2 mg/dL   GFR, Estimated 19 (L) >60 mL/min   Anion gap 11 5 - 15  Ethanol     Status: None   Collection Time: 08/08/22  3:37 AM  Result Value Ref Range   Alcohol, Ethyl (B) <10 <10 mg/dL  Acetaminophen level     Status: Abnormal   Collection Time: 08/08/22  3:37 AM  Result Value Ref Range   Acetaminophen (Tylenol), Serum <10 (L) 10 - 30 ug/mL  I-stat chem 8, ED (not at [facility] or [facility])     Status: Abnormal   Collection Time: 08/08/22  3:39 AM  Result Value Ref Range   Sodium 134 (L) 135 - 145 mmol/L   Potassium 3.1 (L) 3.5 -  5.1 mmol/L   Chloride 96 (L) 98 - 111 mmol/L   BUN 55 (H) 8 - 23 mg/dL   Creatinine, Ser 1.66 (H) 0.44 - 1.00 mg/dL   Glucose, Bld 063 (H) 70 - 99 mg/dL   Calcium, Ion 0.16 0.10 - 1.40 mmol/L   TCO2 27 22 - 32 mmol/L   Hemoglobin 15.0 12.0 - 15.0 g/dL   HCT 93.2 35.5 - 73.2 %  Blood gas, arterial (at [facility])     Status: Abnormal  Collection Time: 08/08/22  5:52 AM  Result Value Ref Range   O2 Content 4.0 L/min   Delivery systems NASAL CANNULA    pH, Arterial 7.36 7.35 - 7.45   pCO2 arterial 47 32 - 48 mmHg   pO2, Arterial 55 (L) 83 - 108 mmHg   Bicarbonate 26.5 20.0 - 28.0 mmol/L   Acid-Base Excess 0.3 0.0 - 2.0 mmol/L   O2 Saturation 90.4 %   Patient temperature 36.6    Collection site RIGHT RADIAL    Drawn by [id number]    Allens test (pass/fail) PASS PASS   CXR: 1. No active disease. 2. Postsurgical changes in the right lung with reduced lung volume and mediastinal shift to the left. 3. Mild cardiomegaly.  CTH CT c-spine 1. No acute intracranial process. 2. Atrophy with chronic microvascular ischemic changes. 3. Multilevel degenerative changes with no evidence of acute fracture.  CT Chest/Ab/Pelvis 1. Postsurgical changes in the left upper lobe with small lucency at the left lung apex, possible pneumatocele versus small pneumothorax. 2. No evidence of solid organ injury or acute fracture. 3. Colonic wall thickening involving the mid to distal transverse colon, which may be infectious or inflammatory. Colonoscopy is recommended for further evaluation on follow-up to exclude underlying neoplasm. 4. Emphysema. 5. Scattered pulmonary nodules bilaterally measuring up to 5 mm, unchanged from 2019. 6. Coronary artery calcifications and aortic atherosclerosis.  Assessment and Plan: Acute metabolic encephalopathy     - placed in obs, SDU     - multiple reasons possible for this: polypharmacy, infections, hypoxia     - CTH not indicative of stroke     - treat the  conditions below and monitor for improvement  Colitis     - as seen on CT     - she is denying any pain     - white count is elevated     - add zosyn  Falls     - PT/OT eval when she is more alert     - CT neg for fracture  Acute hypoxic respiratory failure COPD exacerbation     - CT neg for infection     - checking d-dimer     - place on NRB now as her sats are in the 70s on 5L; initial ABG showed pO2 of 55; will repeat in 1 hour     - nebs  AKI     - renal US     - fluids     - watch nephrotoxins  HTN     - hypotensive now; hold home regimen  HLD CAD     - continue home regimen  Chronic pain     - hold home regimen d/t AMS  DM2     - A1c, SSI, DM diet, glucose checks  Hypokalemia     - replace K+; check Mg2+  Advance Care Planning:  Code Status: FULL  Consults: None  Family Communication: Unable to reach spouse after attempts at number listed in chart.   Severity of Illness: The appropriate patient status for this patient is OBSERVATION. Observation status is judged to be reasonable and necessary in order to provide the required intensity of service to ensure the patient's safety. The patient's presenting symptoms, physical exam findings, and initial radiographic and laboratory data in the context of their medical condition is felt to place them at decreased risk for further clinical deterioration. Furthermore, it is anticipated that the patient will be medically stable for discharge from the  hospital within 2 midnights of admission.   Author: Teddy Spike, DO 08/08/2022 7:20 AM  For on call review [email or web address].

## 2022-08-09 ENCOUNTER — Observation Stay (HOSPITAL_COMMUNITY): Payer: Medicare Other

## 2022-08-09 DIAGNOSIS — E876 Hypokalemia: Secondary | ICD-10-CM | POA: Diagnosis present

## 2022-08-09 DIAGNOSIS — I340 Nonrheumatic mitral (valve) insufficiency: Secondary | ICD-10-CM | POA: Diagnosis not present

## 2022-08-09 DIAGNOSIS — A0472 Enterocolitis due to Clostridium difficile, not specified as recurrent: Secondary | ICD-10-CM | POA: Diagnosis not present

## 2022-08-09 DIAGNOSIS — R0902 Hypoxemia: Secondary | ICD-10-CM | POA: Diagnosis not present

## 2022-08-09 DIAGNOSIS — J9601 Acute respiratory failure with hypoxia: Secondary | ICD-10-CM

## 2022-08-09 DIAGNOSIS — J439 Emphysema, unspecified: Secondary | ICD-10-CM | POA: Diagnosis present

## 2022-08-09 DIAGNOSIS — R059 Cough, unspecified: Secondary | ICD-10-CM | POA: Diagnosis not present

## 2022-08-09 DIAGNOSIS — I2510 Atherosclerotic heart disease of native coronary artery without angina pectoris: Secondary | ICD-10-CM | POA: Diagnosis present

## 2022-08-09 DIAGNOSIS — M419 Scoliosis, unspecified: Secondary | ICD-10-CM | POA: Diagnosis present

## 2022-08-09 DIAGNOSIS — I7300 Raynaud's syndrome without gangrene: Secondary | ICD-10-CM | POA: Diagnosis present

## 2022-08-09 DIAGNOSIS — M4802 Spinal stenosis, cervical region: Secondary | ICD-10-CM | POA: Diagnosis present

## 2022-08-09 DIAGNOSIS — Z1152 Encounter for screening for COVID-19: Secondary | ICD-10-CM | POA: Diagnosis not present

## 2022-08-09 DIAGNOSIS — J441 Chronic obstructive pulmonary disease with (acute) exacerbation: Secondary | ICD-10-CM | POA: Diagnosis not present

## 2022-08-09 DIAGNOSIS — F1120 Opioid dependence, uncomplicated: Secondary | ICD-10-CM | POA: Diagnosis not present

## 2022-08-09 DIAGNOSIS — Z681 Body mass index (BMI) 19 or less, adult: Secondary | ICD-10-CM | POA: Diagnosis not present

## 2022-08-09 DIAGNOSIS — T402X1A Poisoning by other opioids, accidental (unintentional), initial encounter: Secondary | ICD-10-CM | POA: Diagnosis present

## 2022-08-09 DIAGNOSIS — J9811 Atelectasis: Secondary | ICD-10-CM | POA: Diagnosis not present

## 2022-08-09 DIAGNOSIS — M4642 Discitis, unspecified, cervical region: Secondary | ICD-10-CM | POA: Diagnosis present

## 2022-08-09 DIAGNOSIS — N179 Acute kidney failure, unspecified: Secondary | ICD-10-CM | POA: Diagnosis present

## 2022-08-09 DIAGNOSIS — G8929 Other chronic pain: Secondary | ICD-10-CM

## 2022-08-09 DIAGNOSIS — G9341 Metabolic encephalopathy: Secondary | ICD-10-CM | POA: Diagnosis not present

## 2022-08-09 DIAGNOSIS — Z981 Arthrodesis status: Secondary | ICD-10-CM | POA: Diagnosis not present

## 2022-08-09 DIAGNOSIS — E1165 Type 2 diabetes mellitus with hyperglycemia: Secondary | ICD-10-CM | POA: Diagnosis not present

## 2022-08-09 DIAGNOSIS — Z853 Personal history of malignant neoplasm of breast: Secondary | ICD-10-CM | POA: Diagnosis not present

## 2022-08-09 DIAGNOSIS — I10 Essential (primary) hypertension: Secondary | ICD-10-CM | POA: Diagnosis not present

## 2022-08-09 DIAGNOSIS — G928 Other toxic encephalopathy: Secondary | ICD-10-CM | POA: Diagnosis not present

## 2022-08-09 DIAGNOSIS — E785 Hyperlipidemia, unspecified: Secondary | ICD-10-CM | POA: Diagnosis present

## 2022-08-09 DIAGNOSIS — E43 Unspecified severe protein-calorie malnutrition: Secondary | ICD-10-CM | POA: Diagnosis not present

## 2022-08-09 LAB — COMPREHENSIVE METABOLIC PANEL
ALT: 23 U/L (ref 0–44)
AST: 42 U/L — ABNORMAL HIGH (ref 15–41)
Albumin: 3 g/dL — ABNORMAL LOW (ref 3.5–5.0)
Alkaline Phosphatase: 56 U/L (ref 38–126)
Anion gap: 7 (ref 5–15)
BUN: 45 mg/dL — ABNORMAL HIGH (ref 8–23)
CO2: 22 mmol/L (ref 22–32)
Calcium: 8.2 mg/dL — ABNORMAL LOW (ref 8.9–10.3)
Chloride: 108 mmol/L (ref 98–111)
Creatinine, Ser: 0.96 mg/dL (ref 0.44–1.00)
GFR, Estimated: >60 mL/min (ref >60)
Glucose, Bld: 154 mg/dL — ABNORMAL HIGH (ref 70–99)
Potassium: 4 mmol/L (ref 3.5–5.1)
Sodium: 137 mmol/L (ref 135–145)
Total Bilirubin: 1.1 mg/dL (ref 0.3–1.2)
Total Protein: 6.2 g/dL — ABNORMAL LOW (ref 6.5–8.1)

## 2022-08-09 LAB — GLUCOSE, CAPILLARY
Glucose-Capillary: 171 mg/dL — ABNORMAL HIGH (ref 70–99)
Glucose-Capillary: 191 mg/dL — ABNORMAL HIGH (ref 70–99)
Glucose-Capillary: 145 mg/dL — ABNORMAL HIGH (ref 70–99)
Glucose-Capillary: 166 mg/dL — ABNORMAL HIGH (ref 70–99)

## 2022-08-09 LAB — GASTROINTESTINAL PANEL BY PCR, STOOL (REPLACES STOOL CULTURE)

## 2022-08-09 LAB — CBC
HCT: 40.8 % (ref 36.0–46.0)
Hemoglobin: 14.5 g/dL (ref 12.0–15.0)
MCH: 31.6 pg (ref 26.0–34.0)
MCHC: 35.5 g/dL (ref 30.0–36.0)
MCV: 88.9 fL (ref 80.0–100.0)
Platelets: 142 10*3/uL — ABNORMAL LOW (ref 150–400)
RBC: 4.59 MIL/uL (ref 3.87–5.11)
RDW: 13.5 % (ref 11.5–15.5)
WBC: 3 10*3/uL — ABNORMAL LOW (ref 4.0–10.5)
nRBC: 0 % (ref 0.0–0.2)

## 2022-08-09 LAB — HEMOGLOBIN A1C
Hgb A1c MFr Bld: 6.4 % — ABNORMAL HIGH (ref 4.8–5.6)
Mean Plasma Glucose: 137 mg/dL

## 2022-08-09 MED ORDER — VANCOMYCIN HCL 125 MG PO CAPS
125.0000 mg | ORAL_CAPSULE | Freq: Four times a day (QID) | ORAL | Status: DC
  Administered 2022-08-09 – 2022-08-12 (×14): 125 mg via ORAL
  Filled 2022-08-09 (×17): qty 1

## 2022-08-09 MED ORDER — ENOXAPARIN SODIUM 40 MG/0.4ML IJ SOSY
40.0000 mg | PREFILLED_SYRINGE | Freq: Every day | INTRAMUSCULAR | Status: DC
  Administered 2022-08-10 – 2022-08-12 (×3): 40 mg via SUBCUTANEOUS
  Filled 2022-08-09 (×3): qty 0.4

## 2022-08-09 MED ORDER — OXYCODONE HCL 5 MG PO TABS
5.0000 mg | ORAL_TABLET | Freq: Four times a day (QID) | ORAL | Status: DC | PRN
  Administered 2022-08-09 – 2022-08-12 (×9): 5 mg via ORAL
  Administered 2022-08-12: 10 mg via ORAL
  Filled 2022-08-09 (×2): qty 1
  Filled 2022-08-09: qty 2
  Filled 2022-08-09 (×6): qty 1
  Filled 2022-08-09: qty 2

## 2022-08-09 MED ORDER — VANCOMYCIN HCL 500 MG IV SOLR
500.0000 mg | Freq: Four times a day (QID) | Status: DC
  Administered 2022-08-09: 500 mg via RECTAL
  Filled 2022-08-09 (×2): qty 10

## 2022-08-09 MED ORDER — ZINC OXIDE 40 % EX OINT
TOPICAL_OINTMENT | CUTANEOUS | Status: DC | PRN
  Filled 2022-08-09: qty 57

## 2022-08-09 NOTE — Assessment & Plan Note (Signed)
-   Some mild coughing, no significant wheezing - Continue breathing treatments for now

## 2022-08-09 NOTE — Assessment & Plan Note (Signed)
-   Blood pressure recovering - Resume losartan

## 2022-08-09 NOTE — Assessment & Plan Note (Signed)
-   Replete as needed 

## 2022-08-09 NOTE — Progress Notes (Signed)
RT decreased the [place] to 20L and 40%. Weaning patient as tolerated. Rt will continue to monitor

## 2022-08-09 NOTE — Progress Notes (Signed)
Placed patient on 3L [place] and patient tolerating well at this time with no distress.

## 2022-08-09 NOTE — Assessment & Plan Note (Signed)
Continue statin. 

## 2022-08-09 NOTE — Progress Notes (Signed)
[place] stand by. Pt is on 5L [place] and tolerating it well. Pt did not finish the CPT and requested it to be off. Pt did about 7 minutes.

## 2022-08-09 NOTE — Assessment & Plan Note (Addendum)
-   Database reviewed.  Last filled gabapentin on 11/17, oxy IR on 11/5, and MS Contin on 10/30 - suspect unintentional OD on opioids; at patient's age, need to start weaning  - renal failure also likely contributed to accumulation - not safe to resume home regimen; patient plans to discuss outpatient

## 2022-08-09 NOTE — Assessment & Plan Note (Addendum)
-   Patient presenting with profuse diarrhea and rectal tube in place - CT also shows colonic wall thickening involving mid to distal transverse colon consistent with infectious process - C. difficile testing also positive: Antigen positive, toxin negative, PCR positive  -Initially was too obtunded for oral intake on admission.  Mentation improved shortly after admission - continue oral vanc; course continued at discharge for 10-day total

## 2022-08-09 NOTE — Assessment & Plan Note (Signed)
-   PT consulted

## 2022-08-09 NOTE — Assessment & Plan Note (Signed)
Continue aspirin, statin.  

## 2022-08-09 NOTE — Assessment & Plan Note (Addendum)
-   Likely multifactorial for etiology in setting of unintentional opioid overdose, underlying kyphoscoliosis, interstitial scarring -Was escalated up to Optiflow on admission which has been able to be weaned - VQ scan showed diffusely diminished perfusion to the " left lung versus right" but felt to be due to left hemithorax and associated pectus deformity accounting for finding - Low suspicion for PE -Weaned to room air.  Ambulated well with no desaturations

## 2022-08-09 NOTE — Progress Notes (Signed)
Progress Note    [name]   JYN:[id number]  DOB: [birth year]  DOA: 08/08/2022     0 PCP: Ileana Ladd, MD (Inactive)  Initial CC: diarrhea, AMS  Hospital Course: [name] is a 76 yo female with opioid dependence, chronic pain, CAD, HTN, HLD, breast cancer, COPD, OA who presented with falls and weakness.  She also had confusion on admission and was unable to provide collateral information.  She had began developing diarrhea prior to admission as well. She underwent multiple imaging studies on admission including CXR, CT head, CT cervical spine, CT chest/abdomen/pelvis, renal ultrasound, and VQ scan. Findings were most notable for colonic thickening involving mid to distal transverse colon concerning for infectious or inflammatory etiology.  Postsurgical changes were noted in the lung LUL with possible pneumatocele versus small pneumothorax.  Unchanged scattered pulmonary nodules measuring up to 5 mm also noted. PCCM was also consulted on admission. She was found to be hypoxic during workup and required escalation up to Optiflow.  Interval History:  Seen this morning resting comfortably in bed and much more awake and alert.  Able to carry on conversation easily and answer questions.  Oxygen also being weaned when seen.  Assessment and Plan: * Acute metabolic encephalopathy - Considered due to unintentional opioid overuse with superimposed AKI and poor clearance along with possibly some contribution from uremia in setting of AKI - Responded well to Narcan - Will need decrease of opioids on discharge  C. difficile colitis - Patient presenting with profuse diarrhea and rectal tube in place - CT also shows colonic wall thickening involving mid to distal transverse colon consistent with infectious process - C. difficile testing also positive: Antigen positive, toxin negative, PCR positive  -Initially was too obtunded for oral intake on admission.  This morning has improved  significantly; starting fidaxomicin.  Received one-time dose of vancomycin enema on 07/09/22  Acute hypoxic respiratory failure (HCC) - Likely multifactorial for etiology in setting of unintentional opioid overdose, underlying kyphoscoliosis, interstitial scarring -Was escalated up to Optiflow on admission which has been able to be weaned - VQ scan showed diffusely diminished perfusion to the " left lung versus right" but felt to be due to left hemithorax and associated pectus deformity accounting for finding - Continue weaning oxygen as able - Low suspicion for PE  AKI (acute kidney injury) (HCC) - baseline creatinine ~ 0.8 - patient presents with increase in creat >0.3 mg/dL above baseline, creat increase >1.5x baseline presumed to have occurred within past 7 days PTA -Suspected due to GI losses from diarrhea - Creatinine 2.52 on admission - Has improved rapidly with fluids overnight  Chronic pain - Database reviewed.  Last filled gabapentin on 11/17, oxy IR on 11/5, and MS Contin on 10/30 - suspect unintentional OD on opioids; at patient's age, need to start weaning  - renal failure also likely contributed to accumulation  Falls, initial encounter - PT consulted   Type 2 diabetes mellitus with hyperglycemia (HCC) - Continue SSI and CBG monitoring  CAD (coronary artery disease) - Continue aspirin, statin  Hypokalemia - Replete as needed  Essential hypertension - BP soft on admission - Continue holding meds for now  Hyperlipidemia - Continue statin  COPD with acute exacerbation (HCC) - Some mild coughing, no significant wheezing - Continue breathing treatments for now   Old records reviewed in assessment of this patient  Antimicrobials: Vancomycin enema x 1 Flagyl 11/24 >> 11/25 Oral Vanc 11/25 >> current   DVT prophylaxis:  enoxaparin (LOVENOX) injection 40 mg Start: 08/10/22 1000   Code Status:   Code Status: Full Code  Mobility Assessment (last 72 hours)      Mobility Assessment   No documentation.           Barriers to discharge:  Disposition Plan:  Home Status is: Inpt  Objective: Blood pressure (!) 97/54, pulse 79, temperature 98.5 F (36.9 C), temperature source Axillary, resp. rate (!) 30, height 5\' 5"  (1.651 m), weight 56.6 kg, SpO2 96 %.  Examination:  Physical Exam Constitutional:      General: She is not in acute distress.    Appearance: Normal appearance.  HENT:     Head: Normocephalic and atraumatic.     Mouth/Throat:     Mouth: Mucous membranes are dry.  Eyes:     Extraocular Movements: Extraocular movements intact.  Cardiovascular:     Rate and Rhythm: Regular rhythm.  Pulmonary:     Effort: Pulmonary effort is normal. No respiratory distress.     Breath sounds: No wheezing.  Abdominal:     General: Bowel sounds are normal. There is no distension.     Palpations: Abdomen is soft.     Tenderness: There is no abdominal tenderness.  Genitourinary:    Comments: Rectal tube in place with liquid brown stool noted Musculoskeletal:        General: Normal range of motion.     Cervical back: Normal range of motion and neck supple.  Skin:    General: Skin is warm and dry.  Neurological:     General: No focal deficit present.     Mental Status: She is alert.  Psychiatric:        Mood and Affect: Mood normal.      Consultants:  PCCM  Procedures:    Data Reviewed: Results for orders placed or performed during the hospital encounter of 08/08/22 (from the past 24 hour(s))  Glucose, capillary     Status: Abnormal   Collection Time: 08/08/22  5:13 PM  Result Value Ref Range   Glucose-Capillary 109 (H) 70 - 99 mg/dL  C Difficile Quick Screen w PCR reflex     Status: Abnormal   Collection Time: 08/08/22  5:15 PM  Result Value Ref Range   C Diff antigen POSITIVE (A) NEGATIVE   C Diff toxin NEGATIVE NEGATIVE   C Diff interpretation Results are indeterminate. See PCR results.   C. Diff by PCR, Reflexed      Status: Abnormal   Collection Time: 08/08/22  5:15 PM  Result Value Ref Range   Toxigenic C. Difficile by PCR POSITIVE (A) NEGATIVE  Glucose, capillary     Status: None   Collection Time: 08/08/22  9:21 PM  Result Value Ref Range   Glucose-Capillary 76 70 - 99 mg/dL  Comprehensive metabolic panel     Status: Abnormal   Collection Time: 08/09/22  2:27 AM  Result Value Ref Range   Sodium 137 135 - 145 mmol/L   Potassium 4.0 3.5 - 5.1 mmol/L   Chloride 108 98 - 111 mmol/L   CO2 22 22 - 32 mmol/L   Glucose, Bld 154 (H) 70 - 99 mg/dL   BUN 45 (H) 8 - 23 mg/dL   Creatinine, Ser 6.06 0.44 - 1.00 mg/dL   Calcium 8.2 (L) 8.9 - 10.3 mg/dL   Total Protein 6.2 (L) 6.5 - 8.1 g/dL   Albumin 3.0 (L) 3.5 - 5.0 g/dL   AST 42 (H) 15 - 41  U/L   ALT 23 0 - 44 U/L   Alkaline Phosphatase 56 38 - 126 U/L   Total Bilirubin 1.1 0.3 - 1.2 mg/dL   GFR, Estimated >16 >10 mL/min   Anion gap 7 5 - 15  CBC     Status: Abnormal   Collection Time: 08/09/22  2:27 AM  Result Value Ref Range   WBC 3.0 (L) 4.0 - 10.5 K/uL   RBC 4.59 3.87 - 5.11 MIL/uL   Hemoglobin 14.5 12.0 - 15.0 g/dL   HCT 96.0 45.4 - 09.8 %   MCV 88.9 80.0 - 100.0 fL   MCH 31.6 26.0 - 34.0 pg   MCHC 35.5 30.0 - 36.0 g/dL   RDW 11.9 14.7 - 82.9 %   Platelets 142 (L) 150 - 400 K/uL   nRBC 0.0 0.0 - 0.2 %  Glucose, capillary     Status: Abnormal   Collection Time: 08/09/22  7:43 AM  Result Value Ref Range   Glucose-Capillary 171 (H) 70 - 99 mg/dL   Comment 1 Notify RN    Comment 2 Document in Chart   Glucose, capillary     Status: Abnormal   Collection Time: 08/09/22 11:31 AM  Result Value Ref Range   Glucose-Capillary 191 (H) 70 - 99 mg/dL    I have Reviewed nursing notes, Vitals, and Lab results since pt's last encounter. Pertinent lab results : see above I have ordered test including BMP, CBC, Mg I have reviewed the last note from staff over past 24 hours I have discussed pt's care plan and test results with nursing staff, case  manager  Time spent: Greater than 50% of the 55 minute visit was spent in counseling/coordination of care for the patient as laid out in the A&P.    LOS: 0 days   Lewie Chamber, MD [facility] 08/09/2022, 12:53 PM

## 2022-08-09 NOTE — Hospital Course (Addendum)
[name] is a 76 yo female with opioid dependence, chronic pain, CAD, HTN, HLD, breast cancer, COPD, OA who presented with falls and weakness.  She also had confusion on admission and was unable to provide collateral information.  She had began developing diarrhea prior to admission as well. She underwent multiple imaging studies on admission including CXR, CT head, CT cervical spine, CT chest/abdomen/pelvis, renal ultrasound, and VQ scan. Findings were most notable for colonic thickening involving mid to distal transverse colon concerning for infectious or inflammatory etiology.  Postsurgical changes were noted in the lung LUL with possible pneumatocele versus small pneumothorax.  Unchanged scattered pulmonary nodules measuring up to 5 mm also noted. PCCM was also consulted on admission. She was found to be hypoxic during workup and required escalation up to Optiflow. She had progressive clinical improvement and was able to be weaned to room air and also ambulated on room air with no desaturations. See below for further A&P.

## 2022-08-09 NOTE — Assessment & Plan Note (Signed)
-   baseline creatinine ~ 0.8 - patient presents with increase in creat >0.3 mg/dL above baseline, creat increase >1.5x baseline presumed to have occurred within past 7 days PTA -Suspected due to GI losses from diarrhea - Creatinine 2.52 on admission - Has improved rapidly with fluids

## 2022-08-09 NOTE — Assessment & Plan Note (Addendum)
-   Considered due to unintentional opioid overuse with superimposed AKI and poor clearance along with possibly some contribution from uremia in setting of AKI - Responded well to Narcan - Will need decrease of opioids on discharge.  Patient plans to discuss with outpatient provider

## 2022-08-09 NOTE — Consult Note (Signed)
NAME:  [name], MRN:  [id number], DOB:  December 28, 1945, LOS: 0 ADMISSION DATE:  08/08/2022, CONSULTATION DATE:  08/08/22 REFERRING MD:  Margie Ege, MD CHIEF COMPLAINT:  Hypoxemia and altered mental status   History of Present Illness:  76 year old active smoker with chronic pain on morphine, percocet and gabapentin who presents for multiple falls and weakness. Unable to obtain history from patient due to decreased responsiveness. No family at bedside. History obtained from chart. Admitted to [facility] however patient with hypoxemia and on NRB. ABG with no hypercarbia and pO2 ranging 55-60. PCCM consulted for hypoxemia and altered mental status.  Patient drowsy but answers simple questions with 1-2 word responses appropriately. Not opening eyes, but following some commands. Has pinpoint pupils. Given narcan 0.4 mg x 1 with increased alertness, answering questions in full sentences, complaining about feeling cold and following all commands. Has good cough with rhonchi bilaterally in lungs.   Pertinent  Medical History  Chronic pain, CAD s/p PCI in 1990s and 2000, atrial fibrillation without recurrence not on anticoagulation, mitral regurgitation, emphysema, HTN, HLD, DM2 on metformin, hx right breast cancer, hx ETOH abuse, raynauds disease, severe kyphoscoliosis, cervical discitis, spinal stenosis  Significant Hospital Events: Including procedures, antibiotic start and stop dates in addition to other pertinent events   11/24 Admitted to [facility]. Given Narcan with increased alertness  Interim History / Subjective:  This morning, awake alert and protecting airway. Test +C.diff Has productive cough  Objective   Blood pressure (!) 139/58, pulse 83, temperature 98.5 F (36.9 C), temperature source Axillary, resp. rate (!) 27, height 5\' 5"  (1.651 m), weight 56.6 kg, SpO2 96 %.    FiO2 (%):  [40 %-50 %] 40 %   Intake/Output Summary (Last 24 hours) at 08/09/2022 0942 Last data filed at 08/09/2022  4098 Gross per 24 hour  Intake 2207.37 ml  Output 1570 ml  Net 637.37 ml   Filed Weights   08/08/22 1000  Weight: 56.6 kg   Physical Exam: General: Elderly, chronically ill-appearing, no acute distress HENT: [place], AT, OP clear, MMM Eyes: EOMI, no scleral icterus Respiratory: Diminished bases but lung fields clear to auscultation bilaterally.  Central rhonchi when clearing airway/coughing Cardiovascular: RRR, -M/R/G, no JVD GI: BS+, soft, nontender Extremities:-Edema,-tenderness Neuro: AAO x4, CNII-XII grossly intact  Imaging, labs and test noted above have been reviewed independently by me. AKI resolved with normal Cr after foley placed V/Q perfusion neg for PE  CXR 08/09/22 - no acute changes, no infiltrate, atelectasis. Unchanged emphysema, chronic Insterstitial scarring and left mediastinal shift. Scoliosis  Resolved Hospital Problem list   N/A  Assessment & Plan:  Acute toxic encephalopathy 2/2 opioid overuse - resolved CT head neg -S/p narcan 0.4 mg x 1. Repeat if needed however caution with use in chronic opioid user ongoing pain control may be difficult -Pain management per primary team -Supportive care  Acute hypoxemic respiratory failure 2/2 above - improving COPD exacerbation? -Transition from [facility] to [facility]. Currently on FIO2 40% 20L -Continue Brovana and Duonebs q6h -Chest PT -Flutter valve q2h while awake -Allergy to steroids and azithro  Severe kyphoscoliosis with severe back pain Cervical discitis, spinal stenosis On chronic pain meds -Hold home ms  contin, oxycodone and gabapentin  -Will need PRNs when more alert. Per primary team  CAD s/p PCI in 1990s and 2000 Mitral regurgitation HTN, HLD -Telemetry  Falls, syncope, weakness CT head and C-spine neg -Work-up per primary team  C. Diff colitis -PO Vanc  DM2  Raynauds disease  Pulmonary available as needed.  Best Practice (right click and "Reselect all SmartList Selections" daily)    Diet/type: NPO DVT prophylaxis: LMWH GI prophylaxis: N/A Lines: N/A Foley:  N/A Code Status:  full code Last date of multidisciplinary goals of care discussion [11/24] Confirmed with husband by primary team  Critical care time: N/A    Care Time: 50 min  Mechele Collin, M.D. [facility] Pulmonary/Critical Care Medicine 08/09/2022 9:42 AM   Please see Amion for pager number to reach on-call Pulmonary and Critical Care Team.

## 2022-08-09 NOTE — Assessment & Plan Note (Signed)
-   Continue SSI and CBG monitoring ?

## 2022-08-10 ENCOUNTER — Inpatient Hospital Stay (HOSPITAL_COMMUNITY): Payer: Medicare Other

## 2022-08-10 LAB — CBC WITH DIFFERENTIAL/PLATELET
Abs Immature Granulocytes: 0.06 10*3/uL (ref 0.00–0.07)
Basophils Absolute: 0.1 10*3/uL (ref 0.0–0.1)
Basophils Relative: 1 %
Eosinophils Absolute: 0 10*3/uL (ref 0.0–0.5)
Eosinophils Relative: 0 %
HCT: 37 % (ref 36.0–46.0)
Hemoglobin: 12.8 g/dL (ref 12.0–15.0)
Immature Granulocytes: 1 %
Lymphocytes Relative: 11 %
Lymphs Abs: 1 10*3/uL (ref 0.7–4.0)
MCH: 30.8 pg (ref 26.0–34.0)
MCHC: 34.6 g/dL (ref 30.0–36.0)
MCV: 89.2 fL (ref 80.0–100.0)
Monocytes Absolute: 0.8 10*3/uL (ref 0.1–1.0)
Monocytes Relative: 10 %
Neutro Abs: 6.8 10*3/uL (ref 1.7–7.7)
Neutrophils Relative %: 77 %
Platelets: 152 10*3/uL (ref 150–400)
RBC: 4.15 MIL/uL (ref 3.87–5.11)
RDW: 13.5 % (ref 11.5–15.5)
WBC: 8.8 10*3/uL (ref 4.0–10.5)
nRBC: 0 % (ref 0.0–0.2)

## 2022-08-10 LAB — MAGNESIUM: Magnesium: 2.3 mg/dL (ref 1.7–2.4)

## 2022-08-10 LAB — GLUCOSE, CAPILLARY
Glucose-Capillary: 151 mg/dL — ABNORMAL HIGH (ref 70–99)
Glucose-Capillary: 119 mg/dL — ABNORMAL HIGH (ref 70–99)
Glucose-Capillary: 117 mg/dL — ABNORMAL HIGH (ref 70–99)
Glucose-Capillary: 134 mg/dL — ABNORMAL HIGH (ref 70–99)

## 2022-08-10 LAB — BASIC METABOLIC PANEL
Anion gap: 7 (ref 5–15)
BUN: 33 mg/dL — ABNORMAL HIGH (ref 8–23)
CO2: 21 mmol/L — ABNORMAL LOW (ref 22–32)
Calcium: 8.5 mg/dL — ABNORMAL LOW (ref 8.9–10.3)
Chloride: 108 mmol/L (ref 98–111)
Creatinine, Ser: 0.59 mg/dL (ref 0.44–1.00)
GFR, Estimated: >60 mL/min (ref >60)
Glucose, Bld: 122 mg/dL — ABNORMAL HIGH (ref 70–99)
Potassium: 3.2 mmol/L — ABNORMAL LOW (ref 3.5–5.1)
Sodium: 136 mmol/L (ref 135–145)

## 2022-08-10 MED ORDER — LOSARTAN POTASSIUM 50 MG PO TABS
50.0000 mg | ORAL_TABLET | Freq: Every day | ORAL | Status: DC
  Administered 2022-08-10 – 2022-08-12 (×3): 50 mg via ORAL
  Filled 2022-08-10 (×3): qty 1

## 2022-08-10 MED ORDER — POTASSIUM CHLORIDE 20 MEQ PO PACK
40.0000 meq | PACK | Freq: Once | ORAL | Status: AC
  Administered 2022-08-10: 40 meq via ORAL
  Filled 2022-08-10: qty 2

## 2022-08-10 MED ORDER — IPRATROPIUM-ALBUTEROL 0.5-2.5 (3) MG/3ML IN SOLN
3.0000 mL | Freq: Three times a day (TID) | RESPIRATORY_TRACT | Status: DC
  Administered 2022-08-11 – 2022-08-12 (×5): 3 mL via RESPIRATORY_TRACT
  Filled 2022-08-10 (×5): qty 3

## 2022-08-10 NOTE — Progress Notes (Signed)
Progress Note    [name]   WGN:[id number]  DOB: 1946-03-14  DOA: 08/08/2022     1 PCP: Ileana Ladd, MD (Inactive)  Initial CC: diarrhea, AMS  Hospital Course: Ms. Salt is a 76 yo female with opioid dependence, chronic pain, CAD, HTN, HLD, breast cancer, COPD, OA who presented with falls and weakness.  She also had confusion on admission and was unable to provide collateral information.  She had began developing diarrhea prior to admission as well. She underwent multiple imaging studies on admission including CXR, CT head, CT cervical spine, CT chest/abdomen/pelvis, renal ultrasound, and VQ scan. Findings were most notable for colonic thickening involving mid to distal transverse colon concerning for infectious or inflammatory etiology.  Postsurgical changes were noted in the lung LUL with possible pneumatocele versus small pneumothorax.  Unchanged scattered pulmonary nodules measuring up to 5 mm also noted. PCCM was also consulted on admission. She was found to be hypoxic during workup and required escalation up to Optiflow.  Interval History:  No events overnight.  Oxygen has been weaned further since yesterday.  Resting in bed comfortably.  Output from rectal tube also starting to decrease but still noted to be liquid. Denies any abdominal pain.  Remains afebrile.  Assessment and Plan: * Acute metabolic encephalopathy-resolved as of 08/10/2022 - Considered due to unintentional opioid overuse with superimposed AKI and poor clearance along with possibly some contribution from uremia in setting of AKI - Responded well to Narcan - Will need decrease of opioids on discharge  C. difficile colitis - Patient presenting with profuse diarrhea and rectal tube in place - CT also shows colonic wall thickening involving mid to distal transverse colon consistent with infectious process - C. difficile testing also positive: Antigen positive, toxin negative, PCR positive  -Initially  was too obtunded for oral intake on admission.  Mentation improved shortly after admission - continue oral vanc  Acute hypoxic respiratory failure (HCC) - Likely multifactorial for etiology in setting of unintentional opioid overdose, underlying kyphoscoliosis, interstitial scarring -Was escalated up to Optiflow on admission which has been able to be weaned - VQ scan showed diffusely diminished perfusion to the " left lung versus right" but felt to be due to left hemithorax and associated pectus deformity accounting for finding - Continue weaning oxygen as able - Low suspicion for PE  AKI (acute kidney injury) (HCC)-resolved as of 08/10/2022 - baseline creatinine ~ 0.8 - patient presents with increase in creat >0.3 mg/dL above baseline, creat increase >1.5x baseline presumed to have occurred within past 7 days PTA -Suspected due to GI losses from diarrhea - Creatinine 2.52 on admission - Has improved rapidly with fluids  Chronic pain - Database reviewed.  Last filled gabapentin on 11/17, oxy IR on 11/5, and MS Contin on 10/30 - suspect unintentional OD on opioids; at patient's age, need to start weaning  - renal failure also likely contributed to accumulation - not safe to resume home regimen; will resume some as needed  Falls, initial encounter - PT consulted   Type 2 diabetes mellitus with hyperglycemia (HCC) - Continue SSI and CBG monitoring  CAD (coronary artery disease) - Continue aspirin, statin  Hypokalemia - Replete as needed  Essential hypertension - Blood pressure recovering - Resume losartan  Hyperlipidemia - Continue statin  COPD with acute exacerbation (HCC) - Some mild coughing, no significant wheezing - Continue breathing treatments for now   Old records reviewed in assessment of this patient  Antimicrobials: Vancomycin enema x  1 Flagyl 11/24 >> 11/25 Oral Vanc 11/25 >> current   DVT prophylaxis:  enoxaparin (LOVENOX) injection 40 mg Start:  08/10/22 1000   Code Status:   Code Status: Full Code  Mobility Assessment (last 72 hours)     Mobility Assessment   No documentation.           Barriers to discharge:  Disposition Plan:  Home Status is: Inpt  Objective: Blood pressure (!) 163/82, pulse 74, temperature 98 F (36.7 C), temperature source Oral, resp. rate (!) 22, height 5\' 5"  (1.651 m), weight 56.6 kg, SpO2 98 %.  Examination:  Physical Exam Constitutional:      General: She is not in acute distress.    Appearance: Normal appearance.  HENT:     Head: Normocephalic and atraumatic.     Mouth/Throat:     Mouth: Mucous membranes are dry.  Eyes:     Extraocular Movements: Extraocular movements intact.  Cardiovascular:     Rate and Rhythm: Regular rhythm.  Pulmonary:     Effort: Pulmonary effort is normal. No respiratory distress.     Breath sounds: No wheezing.  Abdominal:     General: Bowel sounds are normal. There is no distension.     Palpations: Abdomen is soft.     Tenderness: There is no abdominal tenderness.  Genitourinary:    Comments: Rectal tube in place with liquid stool noted Musculoskeletal:        General: Normal range of motion.     Cervical back: Normal range of motion and neck supple.  Skin:    General: Skin is warm and dry.  Neurological:     General: No focal deficit present.     Mental Status: She is alert.  Psychiatric:        Mood and Affect: Mood normal.      Consultants:  PCCM  Procedures:    Data Reviewed: Results for orders placed or performed during the hospital encounter of 08/08/22 (from the past 24 hour(s))  Glucose, capillary     Status: Abnormal   Collection Time: 08/09/22  4:32 PM  Result Value Ref Range   Glucose-Capillary 145 (H) 70 - 99 mg/dL  Glucose, capillary     Status: Abnormal   Collection Time: 08/09/22  9:09 PM  Result Value Ref Range   Glucose-Capillary 166 (H) 70 - 99 mg/dL  Basic metabolic panel     Status: Abnormal   Collection Time:  08/10/22  2:34 AM  Result Value Ref Range   Sodium 136 135 - 145 mmol/L   Potassium 3.2 (L) 3.5 - 5.1 mmol/L   Chloride 108 98 - 111 mmol/L   CO2 21 (L) 22 - 32 mmol/L   Glucose, Bld 122 (H) 70 - 99 mg/dL   BUN 33 (H) 8 - 23 mg/dL   Creatinine, Ser 9.52 0.44 - 1.00 mg/dL   Calcium 8.5 (L) 8.9 - 10.3 mg/dL   GFR, Estimated >84 >13 mL/min   Anion gap 7 5 - 15  CBC with Differential/Platelet     Status: None   Collection Time: 08/10/22  2:34 AM  Result Value Ref Range   WBC 8.8 4.0 - 10.5 K/uL   RBC 4.15 3.87 - 5.11 MIL/uL   Hemoglobin 12.8 12.0 - 15.0 g/dL   HCT 24.4 01.0 - 27.2 %   MCV 89.2 80.0 - 100.0 fL   MCH 30.8 26.0 - 34.0 pg   MCHC 34.6 30.0 - 36.0 g/dL   RDW 53.6 64.4 -  15.5 %   Platelets 152 150 - 400 K/uL   nRBC 0.0 0.0 - 0.2 %   Neutrophils Relative % 77 %   Neutro Abs 6.8 1.7 - 7.7 K/uL   Lymphocytes Relative 11 %   Lymphs Abs 1.0 0.7 - 4.0 K/uL   Monocytes Relative 10 %   Monocytes Absolute 0.8 0.1 - 1.0 K/uL   Eosinophils Relative 0 %   Eosinophils Absolute 0.0 0.0 - 0.5 K/uL   Basophils Relative 1 %   Basophils Absolute 0.1 0.0 - 0.1 K/uL   WBC Morphology DOHLE BODIES    Immature Granulocytes 1 %   Abs Immature Granulocytes 0.06 0.00 - 0.07 K/uL  Magnesium     Status: None   Collection Time: 08/10/22  2:34 AM  Result Value Ref Range   Magnesium 2.3 1.7 - 2.4 mg/dL  Glucose, capillary     Status: Abnormal   Collection Time: 08/10/22  7:41 AM  Result Value Ref Range   Glucose-Capillary 151 (H) 70 - 99 mg/dL   Comment 1 Notify RN    Comment 2 Document in Chart   Glucose, capillary     Status: Abnormal   Collection Time: 08/10/22 11:43 AM  Result Value Ref Range   Glucose-Capillary 119 (H) 70 - 99 mg/dL   Comment 1 Notify RN    Comment 2 Document in Chart     I have Reviewed nursing notes, Vitals, and Lab results since pt's last encounter. Pertinent lab results : see above I have ordered test including BMP, CBC, Mg I have reviewed the last note from  staff over past 24 hours I have discussed pt's care plan and test results with nursing staff, case manager  Time spent: Greater than 50% of the 55 minute visit was spent in counseling/coordination of care for the patient as laid out in the A&P.    LOS: 1 day   Lewie Chamber, MD [facility] 08/10/2022, 4:17 PM

## 2022-08-10 NOTE — Progress Notes (Signed)
Pt unable to finish cpt this am. She requested it be stopped after a few minutes. Will encourage flutter valve.

## 2022-08-10 NOTE — Evaluation (Signed)
Physical Therapy Evaluation Patient Details Name: [name] MRN: [id number] DOB: 07/28/1946 Today's Date: 08/10/2022  History of Present Illness  Pt admitted from home 2* falls and weakness and dx with COPD with acute exacerbation, acute metabolic encphalopathy, acute hypoxic respiratory failure - likely multifactorial in setting of unintentional opiod OD; AKI, and C-diff  Clinical Impression  Pt admitted as above and presenting with functional mobility limitations 2* generalized weakness, very limited endurance, and balance deficits.  Pt very cooperative this date but tolerated only OOB to recliner.  Pt hopes to progress to dc home with family assist and would benefit from follow up HHPT to maximize IND and safety at home.     Recommendations for follow up therapy are one component of a multi-disciplinary discharge planning process, led by the attending physician.  Recommendations may be updated based on patient status, additional functional criteria and insurance authorization.  Follow Up Recommendations Home health PT      Assistance Recommended at Discharge Frequent or constant Supervision/Assistance  Patient can return home with the following  A lot of help with walking and/or transfers;A little help with bathing/dressing/bathroom;Assistance with cooking/housework;Assist for transportation;Help with stairs or ramp for entrance    Equipment Recommendations None recommended by PT  Recommendations for Other Services  OT consult    Functional Status Assessment Patient has had a recent decline in their functional status and demonstrates the ability to make significant improvements in function in a reasonable and predictable amount of time.     Precautions / Restrictions Precautions Precautions: Fall Precaution Comments: rectal tube Restrictions Weight Bearing Restrictions: No      Mobility  Bed Mobility Overal bed mobility: Needs Assistance Bed Mobility: Supine to Sit      Supine to sit: Min assist     General bed mobility comments: min assist to bring trunk to upright and to complete rotation to EOB sitting on bed pad    Transfers Overall transfer level: Needs assistance Equipment used: Rolling walker (2 wheels) Transfers: Sit to/from Stand, Bed to chair/wheelchair/BSC Sit to Stand: Min assist   Step pivot transfers: Min assist       General transfer comment: Assist to bring wt up and fwd and to balance in initial standing with RW; step pvt bed to recliner with RW    Ambulation/Gait         Gait velocity: decr     General Gait Details: Step pvt bed to chair only 2* fatigue  Stairs            Wheelchair Mobility    Modified Rankin (Stroke Patients Only)       Balance Overall balance assessment: Needs assistance Sitting-balance support: No upper extremity supported, Feet supported Sitting balance-Leahy Scale: Fair     Standing balance support: Bilateral upper extremity supported Standing balance-Leahy Scale: Poor                               Pertinent Vitals/Pain Pain Assessment Pain Assessment: No/denies pain    Home Living Family/patient expects to be discharged to:: Private residence Living Arrangements: Spouse/significant other Available Help at Discharge: Family;Available 24 hours/day Type of Home: House Home Access: Level entry       Home Layout: Multi-level;Able to live on main level with bedroom/bathroom Home Equipment: Rolling Walker (2 wheels)      Prior Function Prior Level of Function : Independent/Modified Independent  Mobility Comments: using RW for mobility       Hand Dominance        Extremity/Trunk Assessment   Upper Extremity Assessment Upper Extremity Assessment: Generalized weakness    Lower Extremity Assessment Lower Extremity Assessment: Generalized weakness    Cervical / Trunk Assessment Cervical / Trunk Assessment: Kyphotic   Communication   Communication: No difficulties  Cognition Arousal/Alertness: Awake/alert Behavior During Therapy: WFL for tasks assessed/performed, Flat affect Overall Cognitive Status: Within Functional Limits for tasks assessed                                          General Comments      Exercises     Assessment/Plan    PT Assessment Patient needs continued PT services  PT Problem List Decreased strength;Decreased activity tolerance;Decreased balance;Decreased mobility;Decreased knowledge of use of DME;Decreased safety awareness       PT Treatment Interventions DME instruction;Gait training;Stair training;Functional mobility training;Therapeutic activities;Therapeutic exercise;Patient/family education    PT Goals (Current goals can be found in the Care Plan section)  Acute Rehab PT Goals Patient Stated Goal: Regain IND PT Goal Formulation: With patient Time For Goal Achievement: 08/10/22 Potential to Achieve Goals: Fair    Frequency Min 3X/week     Co-evaluation               AM-PAC PT "6 Clicks" Mobility  Outcome Measure Help needed turning from your back to your side while in a flat bed without using bedrails?: A Little Help needed moving from lying on your back to sitting on the side of a flat bed without using bedrails?: A Little Help needed moving to and from a bed to a chair (including a wheelchair)?: A Little Help needed standing up from a chair using your arms (e.g., wheelchair or bedside chair)?: A Little Help needed to walk in hospital room?: Total Help needed climbing 3-5 steps with a railing? : Total 6 Click Score: 14    End of Session Equipment Utilized During Treatment: Gait belt;Oxygen Activity Tolerance: Patient limited by fatigue Patient left: in chair;with call bell/phone within reach;with chair alarm set Nurse Communication: Mobility status PT Visit Diagnosis: Difficulty in walking, not elsewhere classified  (R26.2);Unsteadiness on feet (R26.81);History of falling (Z91.81)    Time: 4696-2952 PT Time Calculation (min) (ACUTE ONLY): 18 min   Charges:   PT Evaluation $PT Eval Low Complexity: 1 Low          [name] PT Acute Rehabilitation Services Pager [phone] Office [phone]   [name] 08/10/2022, 4:29 PM

## 2022-08-11 ENCOUNTER — Other Ambulatory Visit (HOSPITAL_COMMUNITY): Payer: Self-pay

## 2022-08-11 ENCOUNTER — Encounter (HOSPITAL_COMMUNITY): Payer: Self-pay | Admitting: Internal Medicine

## 2022-08-11 ENCOUNTER — Other Ambulatory Visit: Payer: Self-pay

## 2022-08-11 DIAGNOSIS — E43 Unspecified severe protein-calorie malnutrition: Secondary | ICD-10-CM | POA: Insufficient documentation

## 2022-08-11 LAB — GLUCOSE, CAPILLARY
Glucose-Capillary: 95 mg/dL (ref 70–99)
Glucose-Capillary: 123 mg/dL — ABNORMAL HIGH (ref 70–99)
Glucose-Capillary: 86 mg/dL (ref 70–99)
Glucose-Capillary: 166 mg/dL — ABNORMAL HIGH (ref 70–99)

## 2022-08-11 LAB — BASIC METABOLIC PANEL
Anion gap: 9 (ref 5–15)
BUN: 26 mg/dL — ABNORMAL HIGH (ref 8–23)
CO2: 20 mmol/L — ABNORMAL LOW (ref 22–32)
Calcium: 8.2 mg/dL — ABNORMAL LOW (ref 8.9–10.3)
Chloride: 107 mmol/L (ref 98–111)
Creatinine, Ser: 0.53 mg/dL (ref 0.44–1.00)
GFR, Estimated: >60 mL/min (ref >60)
Glucose, Bld: 105 mg/dL — ABNORMAL HIGH (ref 70–99)
Potassium: 3.1 mmol/L — ABNORMAL LOW (ref 3.5–5.1)
Sodium: 136 mmol/L (ref 135–145)

## 2022-08-11 LAB — CBC WITH DIFFERENTIAL/PLATELET
Abs Immature Granulocytes: 0.07 10*3/uL (ref 0.00–0.07)
Basophils Absolute: 0 10*3/uL (ref 0.0–0.1)
Basophils Relative: 0 %
Eosinophils Absolute: 0 10*3/uL (ref 0.0–0.5)
Eosinophils Relative: 0 %
HCT: 36.5 % (ref 36.0–46.0)
Hemoglobin: 12.7 g/dL (ref 12.0–15.0)
Immature Granulocytes: 1 %
Lymphocytes Relative: 12 %
Lymphs Abs: 1.2 10*3/uL (ref 0.7–4.0)
MCH: 31.1 pg (ref 26.0–34.0)
MCHC: 34.8 g/dL (ref 30.0–36.0)
MCV: 89.5 fL (ref 80.0–100.0)
Monocytes Absolute: 0.7 10*3/uL (ref 0.1–1.0)
Monocytes Relative: 7 %
Neutro Abs: 7.7 10*3/uL (ref 1.7–7.7)
Neutrophils Relative %: 80 %
Platelets: 164 10*3/uL (ref 150–400)
RBC: 4.08 MIL/uL (ref 3.87–5.11)
RDW: 13.3 % (ref 11.5–15.5)
WBC: 9.7 10*3/uL (ref 4.0–10.5)
nRBC: 0 % (ref 0.0–0.2)

## 2022-08-11 LAB — MAGNESIUM: Magnesium: 1.8 mg/dL (ref 1.7–2.4)

## 2022-08-11 MED ORDER — ENSURE ENLIVE PO LIQD
237.0000 mL | Freq: Two times a day (BID) | ORAL | Status: DC
  Administered 2022-08-11 – 2022-08-12 (×3): 237 mL via ORAL

## 2022-08-11 MED ORDER — POTASSIUM CHLORIDE CRYS ER 20 MEQ PO TBCR
40.0000 meq | EXTENDED_RELEASE_TABLET | ORAL | Status: AC
  Administered 2022-08-11 (×2): 40 meq via ORAL
  Filled 2022-08-11 (×2): qty 2

## 2022-08-11 NOTE — Progress Notes (Signed)
Progress Note    [name]   ZOX:[id number]  DOB: May 31, 1946  DOA: 08/08/2022     2 PCP: Ileana Ladd, MD (Inactive)  Initial CC: diarrhea, AMS  Hospital Course: Ms. Bumpas is a 76 yo female with opioid dependence, chronic pain, CAD, HTN, HLD, breast cancer, COPD, OA who presented with falls and weakness.  She also had confusion on admission and was unable to provide collateral information.  She had began developing diarrhea prior to admission as well. She underwent multiple imaging studies on admission including CXR, CT head, CT cervical spine, CT chest/abdomen/pelvis, renal ultrasound, and VQ scan. Findings were most notable for colonic thickening involving mid to distal transverse colon concerning for infectious or inflammatory etiology.  Postsurgical changes were noted in the lung LUL with possible pneumatocele versus small pneumothorax.  Unchanged scattered pulmonary nodules measuring up to 5 mm also noted. PCCM was also consulted on admission. She was found to be hypoxic during workup and required escalation up to Optiflow.  Interval History:  No events overnight.  On 3 L oxygen this morning.  Output of rectal tube continues to decrease but still noted to be liquid.  Overall patient continues to improve and had no concerns or questions this morning other than when she is able to go home.  Assessment and Plan: * Acute metabolic encephalopathy-resolved as of 08/10/2022 - Considered due to unintentional opioid overuse with superimposed AKI and poor clearance along with possibly some contribution from uremia in setting of AKI - Responded well to Narcan - Will need decrease of opioids on discharge  C. difficile colitis - Patient presenting with profuse diarrhea and rectal tube in place - CT also shows colonic wall thickening involving mid to distal transverse colon consistent with infectious process - C. difficile testing also positive: Antigen positive, toxin negative, PCR  positive  -Initially was too obtunded for oral intake on admission.  Mentation improved shortly after admission - continue oral vanc  Acute hypoxic respiratory failure (HCC) - Likely multifactorial for etiology in setting of unintentional opioid overdose, underlying kyphoscoliosis, interstitial scarring -Was escalated up to Optiflow on admission which has been able to be weaned - VQ scan showed diffusely diminished perfusion to the " left lung versus right" but felt to be due to left hemithorax and associated pectus deformity accounting for finding - Continue weaning oxygen as able - Low suspicion for PE  AKI (acute kidney injury) (HCC)-resolved as of 08/10/2022 - baseline creatinine ~ 0.8 - patient presents with increase in creat >0.3 mg/dL above baseline, creat increase >1.5x baseline presumed to have occurred within past 7 days PTA -Suspected due to GI losses from diarrhea - Creatinine 2.52 on admission - Has improved rapidly with fluids  Chronic pain - Database reviewed.  Last filled gabapentin on 11/17, oxy IR on 11/5, and MS Contin on 10/30 - suspect unintentional OD on opioids; at patient's age, need to start weaning  - renal failure also likely contributed to accumulation - not safe to resume home regimen; will resume some as needed  Falls, initial encounter - PT consulted   Type 2 diabetes mellitus with hyperglycemia (HCC) - Continue SSI and CBG monitoring  CAD (coronary artery disease) - Continue aspirin, statin  Hypokalemia - Replete as needed  Essential hypertension - Blood pressure recovering - Resume losartan  Hyperlipidemia - Continue statin  COPD with acute exacerbation (HCC) - Some mild coughing, no significant wheezing - Continue breathing treatments for now   Old records reviewed in  assessment of this patient  Antimicrobials: Vancomycin enema x 1 Flagyl 11/24 >> 11/25 Oral Vanc 11/25 >> current   DVT prophylaxis:  enoxaparin (LOVENOX)  injection 40 mg Start: 08/10/22 1000   Code Status:   Code Status: Full Code  Mobility Assessment (last 72 hours)     Mobility Assessment     Row Name 08/10/22 2100 08/10/22 1600         Does patient have an order for bedrest or is patient medically unstable No - Continue assessment --      What is the highest level of mobility based on the progressive mobility assessment? Level 5 (Walks with assist in room/hall) - Balance while stepping forward/back and can walk in room with assist - Complete Level 5 (Walks with assist in room/hall) - Balance while stepping forward/back and can walk in room with assist - Complete               Barriers to discharge:  Disposition Plan:  Home Status is: Inpt  Objective: Blood pressure (!) 153/76, pulse 69, temperature 98 F (36.7 C), temperature source Oral, resp. rate 18, height 5\' 5"  (1.651 m), weight 52.7 kg, SpO2 97 %.  Examination:  Physical Exam Constitutional:      General: She is not in acute distress.    Appearance: Normal appearance.  HENT:     Head: Normocephalic and atraumatic.     Mouth/Throat:     Mouth: Mucous membranes are dry.  Eyes:     Extraocular Movements: Extraocular movements intact.  Cardiovascular:     Rate and Rhythm: Regular rhythm.  Pulmonary:     Effort: Pulmonary effort is normal. No respiratory distress.     Breath sounds: No wheezing.  Abdominal:     General: Bowel sounds are normal. There is no distension.     Palpations: Abdomen is soft.     Tenderness: There is no abdominal tenderness.  Genitourinary:    Comments: Rectal tube in place with liquid stool noted Musculoskeletal:        General: Normal range of motion.     Cervical back: Normal range of motion and neck supple.  Skin:    General: Skin is warm and dry.  Neurological:     General: No focal deficit present.     Mental Status: She is alert.  Psychiatric:        Mood and Affect: Mood normal.      Consultants:  PCCM  Procedures:     Data Reviewed: Results for orders placed or performed during the hospital encounter of 08/08/22 (from the past 24 hour(s))  Glucose, capillary     Status: Abnormal   Collection Time: 08/10/22  5:42 PM  Result Value Ref Range   Glucose-Capillary 117 (H) 70 - 99 mg/dL  Glucose, capillary     Status: Abnormal   Collection Time: 08/10/22  8:28 PM  Result Value Ref Range   Glucose-Capillary 134 (H) 70 - 99 mg/dL  Basic metabolic panel     Status: Abnormal   Collection Time: 08/11/22  3:35 AM  Result Value Ref Range   Sodium 136 135 - 145 mmol/L   Potassium 3.1 (L) 3.5 - 5.1 mmol/L   Chloride 107 98 - 111 mmol/L   CO2 20 (L) 22 - 32 mmol/L   Glucose, Bld 105 (H) 70 - 99 mg/dL   BUN 26 (H) 8 - 23 mg/dL   Creatinine, Ser 1.61 0.44 - 1.00 mg/dL   Calcium 8.2 (L)  8.9 - 10.3 mg/dL   GFR, Estimated >69 >62 mL/min   Anion gap 9 5 - 15  CBC with Differential/Platelet     Status: None   Collection Time: 08/11/22  3:35 AM  Result Value Ref Range   WBC 9.7 4.0 - 10.5 K/uL   RBC 4.08 3.87 - 5.11 MIL/uL   Hemoglobin 12.7 12.0 - 15.0 g/dL   HCT 95.2 84.1 - 32.4 %   MCV 89.5 80.0 - 100.0 fL   MCH 31.1 26.0 - 34.0 pg   MCHC 34.8 30.0 - 36.0 g/dL   RDW 40.1 02.7 - 25.3 %   Platelets 164 150 - 400 K/uL   nRBC 0.0 0.0 - 0.2 %   Neutrophils Relative % 80 %   Neutro Abs 7.7 1.7 - 7.7 K/uL   Lymphocytes Relative 12 %   Lymphs Abs 1.2 0.7 - 4.0 K/uL   Monocytes Relative 7 %   Monocytes Absolute 0.7 0.1 - 1.0 K/uL   Eosinophils Relative 0 %   Eosinophils Absolute 0.0 0.0 - 0.5 K/uL   Basophils Relative 0 %   Basophils Absolute 0.0 0.0 - 0.1 K/uL   Immature Granulocytes 1 %   Abs Immature Granulocytes 0.07 0.00 - 0.07 K/uL  Magnesium     Status: None   Collection Time: 08/11/22  3:35 AM  Result Value Ref Range   Magnesium 1.8 1.7 - 2.4 mg/dL  Glucose, capillary     Status: None   Collection Time: 08/11/22  8:25 AM  Result Value Ref Range   Glucose-Capillary 95 70 - 99 mg/dL  Glucose,  capillary     Status: Abnormal   Collection Time: 08/11/22 12:07 PM  Result Value Ref Range   Glucose-Capillary 123 (H) 70 - 99 mg/dL    I have Reviewed nursing notes, Vitals, and Lab results since pt's last encounter. Pertinent lab results : see above I have ordered test including BMP, CBC, Mg I have reviewed the last note from staff over past 24 hours I have discussed pt's care plan and test results with nursing staff, case manager  Time spent: Greater than 50% of the 55 minute visit was spent in counseling/coordination of care for the patient as laid out in the A&P.    LOS: 2 days   Lewie Chamber, MD [facility] 08/11/2022, 3:38 PM

## 2022-08-11 NOTE — Progress Notes (Signed)
Initial Nutrition Assessment  DOCUMENTATION CODES:   Severe malnutrition in context of acute illness/injury  INTERVENTION:  - Liberalize diet to Regular to promote intake.  - Ensure Enlive po BID, each supplement provides 350 kcal and 20 grams of protein. - Encourage intake as tolerated.  - Daily multivitamin to support micronutrient needs. - Monitor weight trends.    NUTRITION DIAGNOSIS:   Severe Malnutrition related to acute illness (C. difficile colitis) as evidenced by severe fat depletion, severe muscle depletion, 19% percent weight loss within the past 6 months.  GOAL:   Patient will meet greater than or equal to 90% of their needs  MONITOR:   PO intake, Supplement acceptance, Weight trends  REASON FOR ASSESSMENT:   Malnutrition Screening Tool    ASSESSMENT:   76 y.o. female with medical history significant of chronic pain, CAD, HTN, HLD, breast CA, COPD who presented with falls and weakness. Found to have C Diff  Patient reports UBW of 140# and admits she has lost some weight recently. Although she is unsure of what time frame the weight loss occurred she attributes weight loss to eating poorly over the past few weeks. Per EMR, patient weighed at 143# in June and now weighed at 116# - a 27# or 19% weight within the past 6 months, severe. Pt feels this is accurate. Pt endorses trying to eat during the day but intake has been hindered by very poor appetite. Thankfully, has been drinking Ensure 2-3x per week.  Pt shares her current appetite is the same as it has been recently. She is noted to have had 50% of her breakfast and lunch yesterday (nothing documented since then). Discussed ordering Ensure to support intake. Encouraged patient to try and eat something at all 3 meals offered and to drink Ensure to better meet calorie and protein needs.  Medications reviewed and include: Insulin, K+ replacement   Labs reviewed:  K+ 3.1 HA1C 6.4 Blood Glucose 95-151 x24  hours   NUTRITION - FOCUSED PHYSICAL EXAM:  Flowsheet Row Most Recent Value  Orbital Region Severe depletion  Upper Arm Region Severe depletion  Thoracic and Lumbar Region Severe depletion  Buccal Region Severe depletion  Temple Region Severe depletion  Clavicle Bone Region Severe depletion  Clavicle and Acromion Bone Region Severe depletion  Scapular Bone Region Unable to assess  Dorsal Hand Severe depletion  Patellar Region Moderate depletion  Anterior Thigh Region Moderate depletion  Posterior Calf Region Moderate depletion  Edema (RD Assessment) Mild  Hair Reviewed  Eyes Reviewed  Mouth Reviewed  Skin Reviewed  Nails Reviewed       Diet Order:   Diet Order             Diet regular Room service appropriate? Yes; Fluid consistency: Thin  Diet effective now                   EDUCATION NEEDS:  Education needs have been addressed  Skin:  Skin Assessment: Skin Integrity Issues: Skin Integrity Issues:: Other (Comment) Other: Irritant Dermatitis (Moisture Associated Skin Damage) to Medial Buttocks and Intertriginous Dermatitis to Coccyx  Last BM:  11/27 - diarrhea  Height:  Ht Readings from Last 1 Encounters:  08/08/22 '5\' 5"'$  (1.651 m)   Weight:  Wt Readings from Last 1 Encounters:  08/11/22 52.7 kg   BMI:  Body mass index is 19.33 kg/m.  Estimated Nutritional Needs:  Kcal:  1850-2000 kcal Protein:  80-95 grams Fluid:  >/= 1.8L    [name]  RD, LDN For contact information, refer to [facility].

## 2022-08-11 NOTE — TOC Initial Note (Signed)
Transition of Care [facility]) - Initial/Assessment Note    Patient Details  Name: [name] MRN: [id number] Date of Birth: 1946/02/14  Transition of Care [facility]) CM/SW Contact:    Golda Acre, RN Phone Number: 08/11/2022, 9:33 AM  Clinical Narrative:                  Transition of Care [facility]) Screening Note   Patient Details  Name: [name] Date of Birth: 12-05-1945   Transition of Care [facility]) CM/SW Contact:    Golda Acre, RN Phone Number: 08/11/2022, 9:33 AM    Transition of Care Department [facility]) has reviewed patient and no TOC needs have been identified at this time. We will continue to monitor patient advancement through interdisciplinary progression rounds. If new patient transition needs arise, please place a TOC consult.    Expected Discharge Plan: Home/Self Care Barriers to Discharge: Continued Medical Work up   Patient Goals and CMS Choice Patient states their goals for this hospitalization and ongoing recovery are:: to go home      Expected Discharge Plan and Services Expected Discharge Plan: Home/Self Care   Discharge Planning Services: CM Consult   Living arrangements for the past 2 months: Single Family Home                                      Prior Living Arrangements/Services Living arrangements for the past 2 months: Single Family Home Lives with:: Spouse   Do you feel safe going back to the place where you live?: Yes            Criminal Activity/Legal Involvement Pertinent to Current Situation/Hospitalization: No - Comment as needed  Activities of Daily Living Home Assistive Devices/Equipment: Other (Comment) (unknown) ADL Screening (condition at time of admission) Patient's cognitive ability adequate to safely complete daily activities?: No Is the patient deaf or have difficulty hearing?: No Does the patient have difficulty seeing, even when wearing glasses/contacts?: No Does the patient have difficulty  concentrating, remembering, or making decisions?: Yes Patient able to express need for assistance with ADLs?: No Does the patient have difficulty dressing or bathing?: Yes Independently performs ADLs?: No Does the patient have difficulty walking or climbing stairs?: Yes Weakness of Legs: Both Weakness of Arms/Hands: Both  Permission Sought/Granted                  Emotional Assessment Appearance:: Appears stated age Attitude/Demeanor/Rapport: Engaged Affect (typically observed): Calm Orientation: : Oriented to Self, Oriented to Place, Oriented to  Time, Oriented to Situation Alcohol / Substance Use: Not Applicable Psych Involvement: No (comment)  Admission diagnosis:  AKI (acute kidney injury) (HCC) [N17.9] Fall, initial encounter [W19.XXXA] Pneumatocele of lung [J98.4] Altered mental status, unspecified altered mental status type [R41.82] Acute metabolic encephalopathy [G93.41] Chronic obstructive pulmonary disease, unspecified COPD type (HCC) [J44.9] Acute hypoxic respiratory failure (HCC) [J96.01] C. difficile colitis [A04.72] Patient Active Problem List   Diagnosis Date Noted   Acute hypoxic respiratory failure (HCC) 08/08/2022   Prediabetes 08/08/2022   Falls, initial encounter 08/08/2022   C. 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   Back pain 02/21/2022   Cardiovascular symptoms 02/21/2022   Contusion of  abdominal wall 02/21/2022   Cough 02/21/2022   Decreased estrogen level 02/21/2022   Diarrhea 02/21/2022   Disorder of breast 02/21/2022   Encounter for screening mammogram for malignant neoplasm of breast 02/21/2022   Former smoker 02/21/2022   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HCC) 02/21/2022   Hardening of the aorta (main  artery of the heart) (HCC) 02/21/2022   Iron deficiency anemia due to chronic blood loss 02/21/2022   Kyphosis 02/21/2022   Malignant neoplasm of female breast (HCC) 02/21/2022   Neck pain 02/21/2022   Orthopnea 02/21/2022   Other long term (current) drug therapy 02/21/2022   Pedal edema 02/21/2022   Proteinuria 02/21/2022   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   Smoker 02/21/2022   Standard chest x-ray abnormal 02/21/2022   Temperature elevated 02/21/2022   Type 2 diabetes mellitus with hyperglycemia (HCC) 02/21/2022   Varicose veins of lower extremity with inflammation 02/21/2022   Acute postoperative pain 11/19/2021   Chronic prescription opiate use 11/19/2021   S/P flap graft 11/19/2021   Raynaud's disease 05/28/2021   Acquired trigger finger of right ring finger 05/27/2021   Pain in right hand 05/27/2021   Closed fracture of shaft of right femur (HCC) 06/13/2019   Cervical radiculopathy 03/16/2019   Gastrostomy tube dysfunction (HCC) 06/25/2018   Impingement syndrome of left shoulder region 05/24/2018   Impingement syndrome of right shoulder region 05/24/2018   Osteoarthritis 03/01/2018   Fracture of multiple ribs of left side 11/29/2017   History of breast cancer 11/29/2017   Pressure injury of sacral region, stage 1 11/29/2017   Pathological fracture of pelvis due to age-related osteoporosis 11/22/2017   Syncope and collapse 11/22/2017   Empyema with fistula (HCC) 11/11/2017   Chronic pain 09/20/2017   CAD (coronary artery disease) 01/26/2017   History of atrial fibrillation 01/26/2017   Tobacco consumption 02/12/2016   S/P right TH revision 10/22/2015   S/P revision of total hip 10/22/2015   Cholecystitis    Esophageal fistula    Abscess    Discitis    HCAP (healthcare-associated pneumonia)    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HCC) 04/28/2015   Abscess of neck    Esophageal perforation    Loss of weight    Esophageal dysphagia    Pneumonia     Respiratory failure (HCC)    Hypokalemia 08/26/2014   Hyponatremia 08/26/2014   COPD with acute exacerbation (HCC)    Tobacco dependence    Hyperlipidemia    Essential hypertension    CAD S/P percutaneous coronary angioplasty    PCP:  Ileana Ladd, MD (Inactive) Pharmacy:   [id number] [place], [place] - [address] [place] [address] Phone: [phone] Fax: [phone]  [id number] - [place], [place] - [address] [place] [address] Phone: [phone] Fax: [phone]     Social Determinants of Health (SDOH) Interventions    Readmission Risk Interventions   No data to display

## 2022-08-11 NOTE — TOC Benefit Eligibility Note (Signed)
Patient Teacher, English as a foreign language completed.    The patient is currently admitted and upon discharge could be taking Vancomycin.  The current 30 day co-pay is $100.00.   The patient is insured through [facility] Part D

## 2022-08-11 NOTE — Progress Notes (Signed)
Physical Therapy Treatment Patient Details Name: [name] MRN: [id number] DOB: July 28, 1946 Today's Date: 08/11/2022   History of Present Illness Pt admitted from home 2* falls and weakness and dx with COPD with acute exacerbation, acute metabolic encphalopathy, acute hypoxic respiratory failure - likely multifactorial in setting of unintentional opiod OD; AKI, and C-diff    PT Comments    Pt assisted with pericare and changing bed pad due to leaking rectal tube.  Pt declined OOB activity at this time.  Pt able to perform bed mobility without assist and mobility appears mostly limited by ongoing bowel movements and rectal tube.   Recommendations for follow up therapy are one component of a multi-disciplinary discharge planning process, led by the attending physician.  Recommendations may be updated based on patient status, additional functional criteria and insurance authorization.  Follow Up Recommendations  Home health PT     Assistance Recommended at Discharge Frequent or constant Supervision/Assistance  Patient can return home with the following A little help with bathing/dressing/bathroom;Assistance with cooking/housework;Assist for transportation;Help with stairs or ramp for entrance;A little help with walking and/or transfers   Equipment Recommendations  None recommended by PT    Recommendations for Other Services       Precautions / Restrictions Precautions Precautions: Fall Precaution Comments: rectal tube     Mobility  Bed Mobility Overal bed mobility: Needs Assistance Bed Mobility: Rolling Rolling: Supervision         General bed mobility comments: rolling for pericare as pt leaking around rectal tube, pt repositioned in sidelying with pillow between legs    Transfers                        Ambulation/Gait                   Stairs             Wheelchair Mobility    Modified Rankin (Stroke Patients Only)       Balance                                             Cognition Arousal/Alertness: Awake/alert Behavior During Therapy: Flat affect Overall Cognitive Status: Within Functional Limits for tasks assessed                                          Exercises      General Comments        Pertinent Vitals/Pain Pain Assessment Pain Assessment: Faces Faces Pain Scale: Hurts little more Pain Location: periarea Pain Descriptors / Indicators: Sore Pain Intervention(s): Repositioned, Monitored during session    Home Living                          Prior Function            PT Goals (current goals can now be found in the care plan section) Acute Rehab PT Goals PT Goal Formulation: With patient Time For Goal Achievement: 08/25/22 Potential to Achieve Goals: Fair Progress towards PT goals: Progressing toward goals    Frequency    Min 3X/week      PT Plan Current plan remains appropriate    Co-evaluation  AM-PAC PT "6 Clicks" Mobility   Outcome Measure  Help needed turning from your back to your side while in a flat bed without using bedrails?: A Little Help needed moving from lying on your back to sitting on the side of a flat bed without using bedrails?: A Little Help needed moving to and from a bed to a chair (including a wheelchair)?: A Little Help needed standing up from a chair using your arms (e.g., wheelchair or bedside chair)?: A Little Help needed to walk in hospital room?: A Lot Help needed climbing 3-5 steps with a railing? : A Lot 6 Click Score: 16    End of Session Equipment Utilized During Treatment: Oxygen Activity Tolerance: Patient limited by fatigue Patient left: in bed;with call bell/phone within reach   PT Visit Diagnosis: Difficulty in walking, not elsewhere classified (R26.2);Unsteadiness on feet (R26.81);History of falling (Z91.81)     Time: 9562-1308 PT Time Calculation (min) (ACUTE  ONLY): 10 min  Charges:  $Therapeutic Activity: 8-22 mins                     Thomasene Mohair PT, DPT Physical Therapist Acute Rehabilitation Services Preferred contact method: Secure Chat Weekend Pager Only: [phone] Office: [phone]    Janan Halter [place] 08/11/2022, 4:07 PM

## 2022-08-12 ENCOUNTER — Other Ambulatory Visit (HOSPITAL_COMMUNITY): Payer: Self-pay

## 2022-08-12 ENCOUNTER — Encounter (HOSPITAL_COMMUNITY): Payer: Self-pay | Admitting: Internal Medicine

## 2022-08-12 ENCOUNTER — Other Ambulatory Visit: Payer: Self-pay

## 2022-08-12 LAB — BASIC METABOLIC PANEL
Anion gap: 10 (ref 5–15)
BUN: 18 mg/dL (ref 8–23)
CO2: 22 mmol/L (ref 22–32)
Calcium: 8.2 mg/dL — ABNORMAL LOW (ref 8.9–10.3)
Chloride: 105 mmol/L (ref 98–111)
Creatinine, Ser: 0.46 mg/dL (ref 0.44–1.00)
GFR, Estimated: >60 mL/min (ref >60)
Glucose, Bld: 107 mg/dL — ABNORMAL HIGH (ref 70–99)
Potassium: 3.5 mmol/L (ref 3.5–5.1)
Sodium: 137 mmol/L (ref 135–145)

## 2022-08-12 LAB — CBC WITH DIFFERENTIAL/PLATELET
Abs Immature Granulocytes: 0.1 10*3/uL — ABNORMAL HIGH (ref 0.00–0.07)
Basophils Absolute: 0 10*3/uL (ref 0.0–0.1)
Basophils Relative: 0 %
Eosinophils Absolute: 0.1 10*3/uL (ref 0.0–0.5)
Eosinophils Relative: 1 %
HCT: 37.2 % (ref 36.0–46.0)
Hemoglobin: 13.1 g/dL (ref 12.0–15.0)
Immature Granulocytes: 1 %
Lymphocytes Relative: 14 %
Lymphs Abs: 1.3 10*3/uL (ref 0.7–4.0)
MCH: 31.3 pg (ref 26.0–34.0)
MCHC: 35.2 g/dL (ref 30.0–36.0)
MCV: 88.8 fL (ref 80.0–100.0)
Monocytes Absolute: 0.7 10*3/uL (ref 0.1–1.0)
Monocytes Relative: 7 %
Neutro Abs: 7.3 10*3/uL (ref 1.7–7.7)
Neutrophils Relative %: 77 %
Platelets: 181 10*3/uL (ref 150–400)
RBC: 4.19 MIL/uL (ref 3.87–5.11)
RDW: 13.3 % (ref 11.5–15.5)
WBC: 9.5 10*3/uL (ref 4.0–10.5)
nRBC: 0 % (ref 0.0–0.2)

## 2022-08-12 LAB — GLUCOSE, CAPILLARY
Glucose-Capillary: 107 mg/dL — ABNORMAL HIGH (ref 70–99)
Glucose-Capillary: 120 mg/dL — ABNORMAL HIGH (ref 70–99)

## 2022-08-12 LAB — MAGNESIUM: Magnesium: 1.9 mg/dL (ref 1.7–2.4)

## 2022-08-12 MED ORDER — ASPIRIN 81 MG PO TBEC: 81.0000 mg | DELAYED_RELEASE_TABLET | Freq: Every day | ORAL | Status: DC

## 2022-08-12 MED ORDER — VANCOMYCIN HCL 125 MG PO CAPS
125.0000 mg | ORAL_CAPSULE | Freq: Four times a day (QID) | ORAL | 0 refills | Status: AC
  Filled 2022-08-12: qty 24, 6d supply, fill #0

## 2022-08-12 NOTE — Care Management Important Message (Signed)
Important Message  Patient Details IM Letter given Name: LE FERRAZ MRN: [id number] Date of Birth: [birth year]   Medicare Important Message Given:  Yes     Kerin Salen 08/12/2022, 11:53 AM

## 2022-08-12 NOTE — Progress Notes (Signed)
Patient ambulated approx 25 feet on RA - maintained sats 96-99% on RA.  No need for O2

## 2022-08-12 NOTE — TOC Benefit Eligibility Note (Signed)
Patient Teacher, English as a foreign language completed.    The patient is currently admitted and upon discharge could be taking Vancomycin 125 mg capsules.  The current 10 day co-pay is $73.55.   The patient is insured through [place] [place] Patient Advocate Specialist [place] Patient Advocate Team Direct Number: [phone]  Fax: [phone]

## 2022-08-12 NOTE — Discharge Summary (Signed)
Physician Discharge Summary   [name] VWU:[id number] DOB: 1945/11/13 DOA: 08/08/2022  PCP: Rachel Ladd, MD (Inactive)  Admit date: 08/08/2022 Discharge date: 08/12/2022  Barriers to discharge: none  Admitted From: home Disposition:  home Discharging physician: Lewie Chamber, MD  Recommendations for Outpatient Follow-up:  Discuss with patient about weaning some opoids if able  Home Health:  Equipment/Devices:   Discharge Condition: stable CODE STATUS: Full Diet recommendation:  Diet Orders (From admission, onward)     Start     Ordered   08/12/22 0000  Diet general        08/12/22 1310   08/11/22 1458  Diet regular Room service appropriate? Yes; Fluid consistency: Thin  Diet effective now       Question Answer Comment  Room service appropriate? Yes   Fluid consistency: Thin      08/11/22 1458            Hospital Course: [name] is a 76 yo female with opioid dependence, chronic pain, CAD, HTN, HLD, breast cancer, COPD, OA who presented with falls and weakness.  She also had confusion on admission and was unable to provide collateral information.  She had began developing diarrhea prior to admission as well. She underwent multiple imaging studies on admission including CXR, CT head, CT cervical spine, CT chest/abdomen/pelvis, renal ultrasound, and VQ scan. Findings were most notable for colonic thickening involving mid to distal transverse colon concerning for infectious or inflammatory etiology.  Postsurgical changes were noted in the lung LUL with possible pneumatocele versus small pneumothorax.  Unchanged scattered pulmonary nodules measuring up to 5 mm also noted. PCCM was also consulted on admission. She was found to be hypoxic during workup and required escalation up to Optiflow. She had progressive clinical improvement and was able to be weaned to room air and also ambulated on room air with no desaturations. See below for further A&P.  Assessment  and Plan: * C. difficile colitis - Patient presenting with profuse diarrhea and rectal tube in place - CT also shows colonic wall thickening involving mid to distal transverse colon consistent with infectious process - C. difficile testing also positive: Antigen positive, toxin negative, PCR positive  -Initially was too obtunded for oral intake on admission.  Mentation improved shortly after admission - continue oral vanc; course continued at discharge for 10-day total  Acute hypoxic respiratory failure (HCC)-resolved as of 08/12/2022 - Likely multifactorial for etiology in setting of unintentional opioid overdose, underlying kyphoscoliosis, interstitial scarring -Was escalated up to Optiflow on admission which has been able to be weaned - VQ scan showed diffusely diminished perfusion to the " left lung versus right" but felt to be due to left hemithorax and associated pectus deformity accounting for finding - Low suspicion for PE -Weaned to room air.  Ambulated well with no desaturations  Acute metabolic encephalopathy-resolved as of 08/10/2022 - Considered due to unintentional opioid overuse with superimposed AKI and poor clearance along with possibly some contribution from uremia in setting of AKI - Responded well to Narcan - Will need decrease of opioids on discharge.  Patient plans to discuss with outpatient provider  AKI (acute kidney injury) (HCC)-resolved as of 08/10/2022 - baseline creatinine ~ 0.8 - patient presents with increase in creat >0.3 mg/dL above baseline, creat increase >1.5x baseline presumed to have occurred within past 7 days PTA -Suspected due to GI losses from diarrhea - Creatinine 2.52 on admission - Has improved rapidly with fluids  Chronic pain - Database reviewed.  Last filled gabapentin on 11/17, oxy IR on 11/5, and MS Contin on 10/30 - suspect unintentional OD on opioids; at patient's age, need to start weaning  - renal failure also likely contributed to  accumulation - not safe to resume home regimen; patient plans to discuss outpatient  Falls, initial encounter - PT consulted   Type 2 diabetes mellitus with hyperglycemia (HCC) - Continue SSI and CBG monitoring  CAD (coronary artery disease) - Continue aspirin, statin  Hypokalemia - Replete as needed  Essential hypertension - Blood pressure recovering - Resume losartan  Hyperlipidemia - Continue statin  COPD with acute exacerbation (HCC) - Some mild coughing, no significant wheezing - Continue breathing treatments for now   The patient's chronic medical conditions were treated accordingly per the patient's home medication regimen except as noted.  On day of discharge, patient was felt deemed stable for discharge. Patient/family member advised to call PCP or come back to ER if needed.   Principal Diagnosis: C. difficile colitis  Discharge Diagnoses: Active Hospital Problems   Diagnosis Date Noted   C. difficile colitis 08/08/2022    Priority: 1.   Chronic pain 09/20/2017    Priority: 4.   Protein-calorie malnutrition, severe 08/11/2022   Falls, initial encounter 08/08/2022   Type 2 diabetes mellitus with hyperglycemia (HCC) 02/21/2022   CAD (coronary artery disease) 01/26/2017   Hypokalemia 08/26/2014   COPD with acute exacerbation [facility])    Essential hypertension    Hyperlipidemia     Resolved Hospital Problems   Diagnosis Date Noted Date Resolved   Acute hypoxic respiratory failure (HCC) 08/08/2022 08/12/2022    Priority: 1.   Acute metabolic encephalopathy 08/08/2022 08/10/2022    Priority: 2.   AKI (acute kidney injury) (HCC)  08/10/2022    Priority: 3.     Discharge Instructions     Diet general   Complete by: As directed    Increase activity slowly   Complete by: As directed    No wound care   Complete by: As directed       Allergies as of 08/12/2022       Reactions   Azithromycin Other (See Comments)   Cefuroxime Other (See Comments)    Cephalexin Other (See Comments)   Took off first layer of skin inside of mouth   Chlorhexidine    Prednisone    Zithromax [azithromycin Dihydrate] Other (See Comments)   ORAL ULCERS        Medication List     TAKE these medications    albuterol 108 (90 Base) MCG/ACT inhaler Commonly known as: VENTOLIN HFA Inhale 2 puffs into the lungs every 6 (six) hours as needed for wheezing.   ascorbic acid 500 MG tablet Commonly known as: VITAMIN C Take 500 mg by mouth daily.   aspirin EC 81 MG tablet Take 1 tablet (81 mg total) by mouth 2 (two) times daily. What changed: when to take this   BIOTIN EXTRA STRENGTH PO Take 500 mg by mouth daily.   cholecalciferol 25 MCG (1000 UNIT) tablet Commonly known as: VITAMIN D3 Take 1,000 Units by mouth daily.   furosemide 20 MG tablet Commonly known as: LASIX Take 1 tablet (20 mg total) by mouth daily as needed for edema.   gabapentin 600 MG tablet Commonly known as: NEURONTIN Take 600 mg by mouth 4 (four) times daily.   losartan 50 MG tablet Commonly known as: COZAAR Take 1 tablet (50 mg total) by mouth daily.   metFORMIN 500 MG 24  hr tablet Commonly known as: GLUCOPHAGE-XR Take 500 mg by mouth every evening.   morphine 30 MG 12 hr tablet Commonly known as: MS CONTIN Take 30 mg by mouth 4 (four) times daily.   multivitamin capsule Take 1 capsule by mouth daily. Centrum silver   oxyCODONE 15 MG immediate release tablet Commonly known as: ROXICODONE Take 15 mg by mouth 4 (four) times daily.   rosuvastatin 10 MG tablet Commonly known as: CRESTOR Take 1 tablet (10 mg total) by mouth daily. Please keep scheduled appointment   vancomycin 125 MG capsule Commonly known as: VANCOCIN Take 1 capsule (125 mg total) by mouth 4 (four) times daily for 6 days.        Allergies  Allergen Reactions   Azithromycin Other (See Comments)   Cefuroxime Other (See Comments)   Cephalexin Other (See Comments)    Took off first layer of  skin inside of mouth   Chlorhexidine    Prednisone    Zithromax [Azithromycin Dihydrate] Other (See Comments)    ORAL ULCERS     Consultations:   Procedures:   Discharge Exam: BP (!) 166/90 (BP Location: Left Arm)   Pulse 69   Temp 97.9 F (36.6 C) (Oral)   Resp 17   Ht 5\' 5"  (1.651 m)   Wt 52.7 kg   SpO2 96%   BMI 19.33 kg/m  Physical Exam Constitutional:      General: She is not in acute distress.    Appearance: Normal appearance.  HENT:     Head: Normocephalic and atraumatic.     Mouth/Throat:     Mouth: Mucous membranes are dry.  Eyes:     Extraocular Movements: Extraocular movements intact.  Cardiovascular:     Rate and Rhythm: Regular rhythm.  Pulmonary:     Effort: Pulmonary effort is normal. No respiratory distress.     Breath sounds: No wheezing.  Abdominal:     General: Bowel sounds are normal. There is no distension.     Palpations: Abdomen is soft.     Tenderness: There is no abdominal tenderness.  Genitourinary:    Comments: Minimal amounts of semiformed stool noted in rectal tube Musculoskeletal:        General: Normal range of motion.     Cervical back: Normal range of motion and neck supple.  Skin:    General: Skin is warm and dry.  Neurological:     General: No focal deficit present.     Mental Status: She is alert.  Psychiatric:        Mood and Affect: Mood normal.      The results of significant diagnostics from this hospitalization (including imaging, microbiology, ancillary and laboratory) are listed below for reference.   Microbiology: Recent Results (from the past 240 hour(s))  SARS Coronavirus 2 by RT PCR (hospital order, performed in [facility] hospital lab) *cepheid single result test* Anterior Nasal Swab     Status: Abnormal   Collection Time: 08/08/22  8:32 AM   Specimen: Anterior Nasal Swab  Result Value Ref Range Status   SARS Coronavirus 2 by RT PCR SARS-CoV-2 target nucleic acids are NOT DETECTED. (A) NEGATIVE Final     Comment: Performed at [facility], [address] [place], [place] [address]  Resp Panel by RT-PCR (Flu A&B, Covid)     Status: None   Collection Time: 08/08/22  8:37 AM  Result Value Ref Range Status   SARS Coronavirus 2 by RT PCR NEGATIVE NEGATIVE  Final    Comment: (NOTE) SARS-CoV-2 target nucleic acids are NOT DETECTED.  The SARS-CoV-2 RNA is generally detectable in upper respiratory specimens during the acute phase of infection. The lowest concentration of SARS-CoV-2 viral copies this assay can detect is 138 copies/mL. A negative result does not preclude SARS-Cov-2 infection and should not be used as the sole basis for treatment or other patient management decisions. A negative result may occur with  improper specimen collection/handling, submission of specimen other than nasopharyngeal swab, presence of viral mutation(s) within the areas targeted by this assay, and inadequate number of viral copies(<138 copies/mL). A negative result must be combined with clinical observations, patient history, and epidemiological information. The expected result is Negative.  Fact Sheet for Patients:  BloggerCourse.com  Fact Sheet for Healthcare Providers:  SeriousBroker.it  This test is no t yet approved or cleared by the [place] FDA and  has been authorized for detection and/or diagnosis of SARS-CoV-2 by FDA under an Emergency Use Authorization (EUA). This EUA will remain  in effect (meaning this test can be used) for the duration of the COVID-19 declaration under Section 564(b)(1) of the Act, 21 U.S.C.section 360bbb-3(b)(1), unless the authorization is terminated  or revoked sooner.       Influenza A by PCR NEGATIVE NEGATIVE Final   Influenza B by PCR NEGATIVE NEGATIVE Final    Comment: (NOTE) The Xpert Xpress SARS-CoV-2/FLU/RSV plus assay is intended as an aid in the diagnosis of influenza from  Nasopharyngeal swab specimens and should not be used as a sole basis for treatment. Nasal washings and aspirates are unacceptable for Xpert Xpress SARS-CoV-2/FLU/RSV testing.  Fact Sheet for Patients: BloggerCourse.com  Fact Sheet for Healthcare Providers: SeriousBroker.it  This test is not yet approved or cleared by the [place] FDA and has been authorized for detection and/or diagnosis of SARS-CoV-2 by FDA under an Emergency Use Authorization (EUA). This EUA will remain in effect (meaning this test can be used) for the duration of the COVID-19 declaration under Section 564(b)(1) of the Act, 21 U.S.C. section 360bbb-3(b)(1), unless the authorization is terminated or revoked.  Performed at [facility], [address] [place], [place] [address]   MRSA Next Gen by PCR, Nasal     Status: None   Collection Time: 08/08/22  9:26 AM   Specimen: Nasal Mucosa; Nasal Swab  Result Value Ref Range Status   MRSA by PCR Next Gen NOT DETECTED NOT DETECTED Final    Comment: (NOTE) The GeneXpert MRSA Assay (FDA approved for NASAL specimens only), is one component of a comprehensive MRSA colonization surveillance program. It is not intended to diagnose MRSA infection nor to guide or monitor treatment for MRSA infections. Test performance is not FDA approved in patients less than 74 years old. Performed at [facility], [address], [place], [place] [address]   C Difficile Quick Screen w PCR reflex     Status: Abnormal   Collection Time: 08/08/22  5:15 PM  Result Value Ref Range Status   C Diff antigen POSITIVE (A) NEGATIVE Final   C Diff toxin NEGATIVE NEGATIVE Final   C Diff interpretation Results are indeterminate. See PCR results.  Final    Comment: Performed at [facility], [address] [place], [place] [address]  C. Diff by PCR, Reflexed     Status: Abnormal    Collection Time: 08/08/22  5:15 PM  Result Value Ref Range Status   Toxigenic C. Difficile by PCR POSITIVE (  A) NEGATIVE Final    Comment: Positive for toxigenic C. difficile with little to no toxin production. Only treat if clinical presentation suggests symptomatic illness. Performed at [facility], [address] [place], [place] [address]   Gastrointestinal Panel by PCR , Stool     Status: None   Collection Time: 08/08/22  5:56 PM   Specimen: Stool  Result Value Ref Range Status   Campylobacter species NOT DETECTED NOT DETECTED Final   Plesimonas shigelloides NOT DETECTED NOT DETECTED Final   Salmonella species NOT DETECTED NOT DETECTED Final   Yersinia enterocolitica NOT DETECTED NOT DETECTED Final   Vibrio species NOT DETECTED NOT DETECTED Final   Vibrio cholerae NOT DETECTED NOT DETECTED Final   Enteroaggregative E coli (EAEC) NOT DETECTED NOT DETECTED Final   Enteropathogenic E coli (EPEC) NOT DETECTED NOT DETECTED Final   Enterotoxigenic E coli (ETEC) NOT DETECTED NOT DETECTED Final   Shiga like toxin producing E coli (STEC) NOT DETECTED NOT DETECTED Final   Shigella/Enteroinvasive E coli (EIEC) NOT DETECTED NOT DETECTED Final   Cryptosporidium NOT DETECTED NOT DETECTED Final   Cyclospora cayetanensis NOT DETECTED NOT DETECTED Final   Entamoeba histolytica NOT DETECTED NOT DETECTED Final   Giardia lamblia NOT DETECTED NOT DETECTED Final   Adenovirus F40/41 NOT DETECTED NOT DETECTED Final   Astrovirus NOT DETECTED NOT DETECTED Final   Norovirus GI/GII NOT DETECTED NOT DETECTED Final   Rotavirus A NOT DETECTED NOT DETECTED Final   Sapovirus (I, II, IV, and V) NOT DETECTED NOT DETECTED Final    Comment: Performed at [facility], [address] [place], [place] [address]     Labs: BNP (last 3 results) No results for input(s): "BNP" in the last 8760 hours. Basic Metabolic Panel: Recent Labs  Lab 08/08/22 0316 08/08/22 0339 08/08/22 0944 08/09/22 0227  08/10/22 0234 08/11/22 0335 08/12/22 0412  NA 135 134*  --  137 136 136 137  K 3.3* 3.1*  --  4.0 3.2* 3.1* 3.5  CL 98 96*  --  108 108 107 105  CO2 26  --   --  22 21* 20* 22  GLUCOSE 172* 166*  --  154* 122* 105* 107*  BUN 62* 55*  --  45* 33* 26* 18  CREATININE 2.52* 2.60*  --  0.96 0.59 0.53 0.46  CALCIUM 9.3  --   --  8.2* 8.5* 8.2* 8.2*  MG  --   --  2.9*  --  2.3 1.8 1.9   Liver Function Tests: Recent Labs  Lab 08/08/22 0316 08/09/22 0227  AST 26 42*  ALT 22 23  ALKPHOS 77 56  BILITOT 0.8 1.1  PROT 7.8 6.2*  ALBUMIN 4.2 3.0*   No results for input(s): "LIPASE", "AMYLASE" in the last 168 hours. No results for input(s): "AMMONIA" in the last 168 hours. CBC: Recent Labs  Lab 08/08/22 0316 08/08/22 0339 08/09/22 0227 08/10/22 0234 08/11/22 0335 08/12/22 0412  WBC 15.2*  --  3.0* 8.8 9.7 9.5  NEUTROABS 11.5*  --   --  6.8 7.7 7.3  HGB 14.7 15.0 14.5 12.8 12.7 13.1  HCT 44.4 44.0 40.8 37.0 36.5 37.2  MCV 93.3  --  88.9 89.2 89.5 88.8  PLT 201  --  142* 152 164 181   Cardiac Enzymes: No results for input(s): "CKTOTAL", "CKMB", "CKMBINDEX", "TROPONINI" in the last 168 hours. BNP: Invalid input(s): "POCBNP" CBG: Recent Labs  Lab 08/11/22 1207 08/11/22 1636 08/11/22 2100 08/12/22 0749 08/12/22  1210  GLUCAP 123* 86 166* 107* 120*   D-Dimer No results for input(s): "DDIMER" in the last 72 hours. Hgb A1c No results for input(s): "HGBA1C" in the last 72 hours. Lipid Profile No results for input(s): "CHOL", "HDL", "LDLCALC", "TRIG", "CHOLHDL", "LDLDIRECT" in the last 72 hours. Thyroid function studies No results for input(s): "TSH", "T4TOTAL", "T3FREE", "THYROIDAB" in the last 72 hours.  Invalid input(s): "FREET3" Anemia work up No results for input(s): "VITAMINB12", "FOLATE", "FERRITIN", "TIBC", "IRON", "RETICCTPCT" in the last 72 hours. Urinalysis    Component Value Date/Time   COLORURINE AMBER (A) 08/08/2022 0300   APPEARANCEUR HAZY (A) 08/08/2022  0300   LABSPEC 1.020 08/08/2022 0300   PHURINE 5.0 08/08/2022 0300   GLUCOSEU NEGATIVE 08/08/2022 0300   HGBUR NEGATIVE 08/08/2022 0300   BILIRUBINUR NEGATIVE 08/08/2022 0300   KETONESUR 5 (A) 08/08/2022 0300   PROTEINUR 100 (A) 08/08/2022 0300   UROBILINOGEN 1.0 04/26/2015 1748   NITRITE NEGATIVE 08/08/2022 0300   LEUKOCYTESUR TRACE (A) 08/08/2022 0300   Sepsis Labs Recent Labs  Lab 08/09/22 0227 08/10/22 0234 08/11/22 0335 08/12/22 0412  WBC 3.0* 8.8 9.7 9.5   Microbiology Recent Results (from the past 240 hour(s))  SARS Coronavirus 2 by RT PCR (hospital order, performed in [facility] hospital lab) *cepheid single result test* Anterior Nasal Swab     Status: Abnormal   Collection Time: 08/08/22  8:32 AM   Specimen: Anterior Nasal Swab  Result Value Ref Range Status   SARS Coronavirus 2 by RT PCR SARS-CoV-2 target nucleic acids are NOT DETECTED. (A) NEGATIVE Final    Comment: Performed at [facility], [address], [place], [place] [address]  Resp Panel by RT-PCR (Flu A&B, Covid)     Status: None   Collection Time: 08/08/22  8:37 AM  Result Value Ref Range Status   SARS Coronavirus 2 by RT PCR NEGATIVE NEGATIVE Final    Comment: (NOTE) SARS-CoV-2 target nucleic acids are NOT DETECTED.  The SARS-CoV-2 RNA is generally detectable in upper respiratory specimens during the acute phase of infection. The lowest concentration of SARS-CoV-2 viral copies this assay can detect is 138 copies/mL. A negative result does not preclude SARS-Cov-2 infection and should not be used as the sole basis for treatment or other patient management decisions. A negative result may occur with  improper specimen collection/handling, submission of specimen other than nasopharyngeal swab, presence of viral mutation(s) within the areas targeted by this assay, and inadequate number of viral copies(<138 copies/mL). A negative result must be combined with clinical observations,  patient history, and epidemiological information. The expected result is Negative.  Fact Sheet for Patients:  BloggerCourse.com  Fact Sheet for Healthcare Providers:  SeriousBroker.it  This test is no t yet approved or cleared by the [place] FDA and  has been authorized for detection and/or diagnosis of SARS-CoV-2 by FDA under an Emergency Use Authorization (EUA). This EUA will remain  in effect (meaning this test can be used) for the duration of the COVID-19 declaration under Section 564(b)(1) of the Act, 21 U.S.C.section 360bbb-3(b)(1), unless the authorization is terminated  or revoked sooner.       Influenza A by PCR NEGATIVE NEGATIVE Final   Influenza B by PCR NEGATIVE NEGATIVE Final    Comment: (NOTE) The Xpert Xpress SARS-CoV-2/FLU/RSV plus assay is intended as an aid in the diagnosis of influenza from Nasopharyngeal swab specimens and should not be used as a sole basis for treatment. Nasal washings and aspirates are unacceptable  for Xpert Xpress SARS-CoV-2/FLU/RSV testing.  Fact Sheet for Patients: BloggerCourse.com  Fact Sheet for Healthcare Providers: SeriousBroker.it  This test is not yet approved or cleared by the [place] FDA and has been authorized for detection and/or diagnosis of SARS-CoV-2 by FDA under an Emergency Use Authorization (EUA). This EUA will remain in effect (meaning this test can be used) for the duration of the COVID-19 declaration under Section 564(b)(1) of the Act, 21 U.S.C. section 360bbb-3(b)(1), unless the authorization is terminated or revoked.  Performed at [facility], [address] [place], [place] [address]   MRSA Next Gen by PCR, Nasal     Status: None   Collection Time: 08/08/22  9:26 AM   Specimen: Nasal Mucosa; Nasal Swab  Result Value Ref Range Status   MRSA by PCR Next Gen NOT DETECTED NOT  DETECTED Final    Comment: (NOTE) The GeneXpert MRSA Assay (FDA approved for NASAL specimens only), is one component of a comprehensive MRSA colonization surveillance program. It is not intended to diagnose MRSA infection nor to guide or monitor treatment for MRSA infections. Test performance is not FDA approved in patients less than 71 years old. Performed at [facility], [address] [place], [place] [address]   C Difficile Quick Screen w PCR reflex     Status: Abnormal   Collection Time: 08/08/22  5:15 PM  Result Value Ref Range Status   C Diff antigen POSITIVE (A) NEGATIVE Final   C Diff toxin NEGATIVE NEGATIVE Final   C Diff interpretation Results are indeterminate. See PCR results.  Final    Comment: Performed at [facility], [address], [place], [place] [address]  C. Diff by PCR, Reflexed     Status: Abnormal   Collection Time: 08/08/22  5:15 PM  Result Value Ref Range Status   Toxigenic C. Difficile by PCR POSITIVE (A) NEGATIVE Final    Comment: Positive for toxigenic C. difficile with little to no toxin production. Only treat if clinical presentation suggests symptomatic illness. Performed at [facility], [address], [place], [place] [address]   Gastrointestinal Panel by PCR , Stool     Status: None   Collection Time: 08/08/22  5:56 PM   Specimen: Stool  Result Value Ref Range Status   Campylobacter species NOT DETECTED NOT DETECTED Final   Plesimonas shigelloides NOT DETECTED NOT DETECTED Final   Salmonella species NOT DETECTED NOT DETECTED Final   Yersinia enterocolitica NOT DETECTED NOT DETECTED Final   Vibrio species NOT DETECTED NOT DETECTED Final   Vibrio cholerae NOT DETECTED NOT DETECTED Final   Enteroaggregative E coli (EAEC) NOT DETECTED NOT DETECTED Final   Enteropathogenic E coli (EPEC) NOT DETECTED NOT DETECTED Final   Enterotoxigenic E coli (ETEC) NOT DETECTED NOT DETECTED Final   Shiga like toxin  producing E coli (STEC) NOT DETECTED NOT DETECTED Final   Shigella/Enteroinvasive E coli (EIEC) NOT DETECTED NOT DETECTED Final   Cryptosporidium NOT DETECTED NOT DETECTED Final   Cyclospora cayetanensis NOT DETECTED NOT DETECTED Final   Entamoeba histolytica NOT DETECTED NOT DETECTED Final   Giardia lamblia NOT DETECTED NOT DETECTED Final   Adenovirus F40/41 NOT DETECTED NOT DETECTED Final   Astrovirus NOT DETECTED NOT DETECTED Final   Norovirus GI/GII NOT DETECTED NOT DETECTED Final   Rotavirus A NOT DETECTED NOT DETECTED Final   Sapovirus (I, II, IV, and V) NOT DETECTED NOT DETECTED Final    Comment: Performed at [facility], [address], [place], [place] [address]    Procedures/Studies: [place] CHEST PORT 1 VIEW  Result Date: 08/10/2022 CLINICAL DATA:  Hypoxemia, cough. EXAM: PORTABLE CHEST 1 VIEW COMPARISON:  August 09, 2022. FINDINGS: Stable cardiomediastinal silhouette. Minimal right basilar subsegmental atelectasis is noted. Left basilar atelectasis or infiltrate is noted with associated effusion. Bony thorax is unremarkable. IMPRESSION: Left basilar opacity is noted concerning for atelectasis or infiltrate with associated effusion. Minimal right basilar subsegmental atelectasis or scarring is noted. Aortic Atherosclerosis (ICD10-I70.0). Electronically Signed   By: Lupita Raider M.D.   On: 08/10/2022 09:30   DG CHEST PORT 1 VIEW  Result Date: 08/09/2022 CLINICAL DATA:  Hypoxemia. EXAM: PORTABLE CHEST 1 VIEW COMPARISON:  Chest x-ray and chest CT 08/08/2022 FINDINGS: The cardiac silhouette, mediastinal and contours are stable. Stable emphysematous changes and pulmonary scarring. Stable surgical changes involving the left upper lobe and loss of volume in the left hemithorax. No acute pulmonary findings. Significant pectus deformity and scoliosis. Remote rib fractures. IMPRESSION: Chronic lung changes but no acute pulmonary findings. Electronically Signed   By: Rudie Meyer  M.D.   On: 08/09/2022 09:22   NM Pulmonary Perfusion  Result Date: 08/08/2022 CLINICAL DATA:  Elevated D-dimer, history of fall, blunt polytrauma EXAM: NUCLEAR MEDICINE PERFUSION LUNG SCAN TECHNIQUE: Perfusion images were obtained in multiple projections after intravenous injection of radiopharmaceutical. Ventilation scans intentionally deferred if perfusion scan and chest x-ray adequate for interpretation during COVID 19 epidemic. RADIOPHARMACEUTICALS:  4.2 mCi Tc-74m MAA IV COMPARISON:  None Correlation: Noncontrast CT chest 08/08/2022 FINDINGS: Diffusely diminished perfusion to LEFT lung versus RIGHT, with CT demonstrating small LEFT hemithorax versus RIGHT and associated pectus deformity, likely accounting for finding. No segmental or subsegmental perfusion defects are identified. IMPRESSION: Small LEFT lung versus RIGHT corresponding to smaller LEFT hemithorax on CT. Pulmonary embolism absent. Electronically Signed   By: Ulyses Southward M.D.   On: 08/08/2022 15:27   US RENAL  Result Date: 08/08/2022 CLINICAL DATA:  Acute kidney injury. EXAM: RENAL / URINARY TRACT ULTRASOUND COMPLETE COMPARISON:  CT scan of same day. FINDINGS: Right Kidney: Renal measurements: 10.4 x 5.3 x 4.4 cm = volume: 125 mL. Mildly increased echogenicity of renal parenchyma is noted. 2 simple cysts are noted, the largest measuring 2.3 cm. No mass or hydronephrosis visualized. Left Kidney: Renal measurements: 8.9 x 5.0 x 4.9 cm = volume: 115 mL. Increased echogenicity of renal parenchyma is noted. No mass or hydronephrosis visualized. Bladder: Appears normal for degree of bladder distention. Other: None. IMPRESSION: Increased echogenicity of renal parenchyma is noted bilaterally suggesting medical renal disease. No hydronephrosis or renal obstruction is noted. Electronically Signed   By: Lupita Raider M.D.   On: 08/08/2022 10:48   CT CHEST ABDOMEN PELVIS WO CONTRAST  Result Date: 08/08/2022 CLINICAL DATA:  Polytrauma, blunt.   Fall. EXAM: CT CHEST, ABDOMEN AND PELVIS WITHOUT CONTRAST TECHNIQUE: Multidetector CT imaging of the chest, abdomen and pelvis was performed following the standard protocol without IV contrast. RADIATION DOSE REDUCTION: This exam was performed according to the departmental dose-optimization program which includes automated exposure control, adjustment of the mA and/or kV according to patient size and/or use of iterative reconstruction technique. COMPARISON:  03/28/2020, 09/20/2017. FINDINGS: CT CHEST FINDINGS Cardiovascular: Heart is enlarged and there is no pericardial effusion. Multi-vessel coronary artery calcifications are noted. There is atherosclerotic calcification of the aorta without evidence of aneurysm. The pulmonary trunk is normal in caliber. Mediastinum/Nodes: No mediastinal or axillary lymphadenopathy. Surgical clips are  noted in the right axilla. Evaluation of the hilar regions is limited due to lack of IV contrast. The thyroid gland and esophagus are within normal limits. Debris is noted in the distal trachea and mainstem bronchi bilaterally, greater on the right than on the left. Lungs/Pleura: Bronchial wall thickening is present bilaterally. Strandy atelectasis or scarring is noted bilaterally. Surgical changes are noted along the medial border of the left upper lobe with pleuroparenchymal scarring and possible small pneumatocele versus small pneumothorax. Paraseptal emphysematous changes. Scattered pulmonary nodules are noted in the lungs, the largest measuring 5 mm in the right upper lobe, axial image 42. Musculoskeletal: No acute fracture. CT ABDOMEN PELVIS FINDINGS Hepatobiliary: No focal liver abnormality is seen. No biliary ductal dilatation. Stones are present within the gallbladder. Pancreas: Unremarkable. No pancreatic ductal dilatation or surrounding inflammatory changes. Spleen: No splenic injury or perisplenic hematoma. Adrenals/Urinary Tract: The left adrenal gland is within normal  limits. The right adrenal gland is not definitely visualized on exam. The right kidney is pelvic in location. No renal calculus or hydronephrosis. The bladder is not well seen due to streak hardware artifact. Stomach/Bowel: Stomach is within normal limits. Appendix appears normal. No bowel obstruction, free air or pneumatosis. There is mild thickening of the walls of the distal transverse colon. 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 No abdominal or pelvic lymphadenopathy. Reproductive: Not seen due to streak artifact. Other: No abdominopelvic ascites. Musculoskeletal: Degenerative changes in the lumbar spine. Total hip arthroplasty changes are noted bilaterally. IMPRESSION: 1. Postsurgical changes in the left upper lobe with small lucency at the left lung apex, possible pneumatocele versus small pneumothorax. 2. No evidence of solid organ injury or acute fracture. 3. Colonic wall thickening involving the mid to distal transverse colon, which may be infectious or inflammatory. Colonoscopy is recommended for further evaluation on follow-up to exclude underlying neoplasm. 4. Emphysema. 5. Scattered pulmonary nodules bilaterally measuring up to 5 mm, unchanged from 2019. 6. Coronary artery calcifications and aortic atherosclerosis. Electronically Signed   By: Thornell Sartorius M.D.   On: 08/08/2022 05:06   CT Head Wo Contrast  Result Date: 08/08/2022 CLINICAL DATA:  Polytrauma, blunt.  Fall. EXAM: CT HEAD WITHOUT CONTRAST CT CERVICAL SPINE WITHOUT CONTRAST TECHNIQUE: Multidetector CT imaging of the head and cervical spine was performed following the standard protocol without intravenous contrast. Multiplanar CT image reconstructions of the cervical spine were also generated. RADIATION DOSE REDUCTION: This exam was performed according to the departmental dose-optimization program which includes automated exposure control, adjustment of the  mA and/or kV according to patient size and/or use of iterative reconstruction technique. COMPARISON:  08/26/2014, 11/06/2016. FINDINGS: CT HEAD FINDINGS Brain: No acute intracranial hemorrhage, midline shift or mass effect. No extra-axial fluid collection. Diffuse atrophy is noted. Periventricular white matter hypodensities are noted bilaterally. No hydrocephalus. Vascular: No hyperdense vessel or unexpected calcification. Skull: Normal. Negative for fracture or focal lesion. Sinuses/Orbits: No acute finding. Other: None. CT CERVICAL SPINE FINDINGS Alignment: Mild anterolisthesis at C6-C7 and C7-T1. Skull base and vertebrae: No acute fracture. There is fusion of the C3 C5 vertebral bodies. Soft tissues and spinal canal: No prevertebral fluid or swelling. No visible canal hematoma. Disc levels: Intervertebral disc space narrowing and degenerative endplate changes are present at C2-C3, C5-C6, C6-C7, and C7-T1, resulting in mild-to-moderate spinal canal stenosis, most pronounced at C3-C4. Facet arthropathy is noted bilaterally. Upper chest: Emphysematous changes are present at the lung apices. Other: Carotid artery calcifications and aortic  atherosclerosis. IMPRESSION: 1. No acute intracranial process. 2. Atrophy with chronic microvascular ischemic changes. 3. Multilevel degenerative changes with no evidence of acute fracture. Electronically Signed   By: Thornell Sartorius M.D.   On: 08/08/2022 04:46   CT Cervical Spine Wo Contrast  Result Date: 08/08/2022 CLINICAL DATA:  Polytrauma, blunt.  Fall. EXAM: CT HEAD WITHOUT CONTRAST CT CERVICAL SPINE WITHOUT CONTRAST TECHNIQUE: Multidetector CT imaging of the head and cervical spine was performed following the standard protocol without intravenous contrast. Multiplanar CT image reconstructions of the cervical spine were also generated. RADIATION DOSE REDUCTION: This exam was performed according to the departmental dose-optimization program which includes automated exposure  control, adjustment of the mA and/or kV according to patient size and/or use of iterative reconstruction technique. COMPARISON:  08/26/2014, 11/06/2016. FINDINGS: CT HEAD FINDINGS Brain: No acute intracranial hemorrhage, midline shift or mass effect. No extra-axial fluid collection. Diffuse atrophy is noted. Periventricular white matter hypodensities are noted bilaterally. No hydrocephalus. Vascular: No hyperdense vessel or unexpected calcification. Skull: Normal. Negative for fracture or focal lesion. Sinuses/Orbits: No acute finding. Other: None. CT CERVICAL SPINE FINDINGS Alignment: Mild anterolisthesis at C6-C7 and C7-T1. Skull base and vertebrae: No acute fracture. There is fusion of the C3 C5 vertebral bodies. Soft tissues and spinal canal: No prevertebral fluid or swelling. No visible canal hematoma. Disc levels: Intervertebral disc space narrowing and degenerative endplate changes are present at C2-C3, C5-C6, C6-C7, and C7-T1, resulting in mild-to-moderate spinal canal stenosis, most pronounced at C3-C4. Facet arthropathy is noted bilaterally. Upper chest: Emphysematous changes are present at the lung apices. Other: Carotid artery calcifications and aortic atherosclerosis. IMPRESSION: 1. No acute intracranial process. 2. Atrophy with chronic microvascular ischemic changes. 3. Multilevel degenerative changes with no evidence of acute fracture. Electronically Signed   By: Thornell Sartorius M.D.   On: 08/08/2022 04:46   DG Chest Portable 1 View  Result Date: 08/08/2022 CLINICAL DATA:  Fall, weakness. EXAM: PORTABLE CHEST 1 VIEW COMPARISON:  04/01/2018. FINDINGS: Cardiac silhouette is enlarged. Atherosclerotic calcification of the aorta is noted. Surgical changes are noted in the left lung with reduced lung volume and mediastinal shift to the left. No consolidation, effusion, or pneumothorax. Surgical clips are present over the right chest. No acute osseous abnormality. IMPRESSION: 1. No active disease. 2.  Postsurgical changes in the right lung with reduced lung volume and mediastinal shift to the left. 3. Mild cardiomegaly. Electronically Signed   By: Thornell Sartorius M.D.   On: 08/08/2022 03:20     Time coordinating discharge: Over 30 minutes    Lewie Chamber, MD  [facility] 08/12/2022, 2:34 PM

## 2022-08-12 NOTE — Progress Notes (Signed)
Patient discharging home with family.  IVs removed - WNL.  Reviewed AVS and medications.  Verbalized understanding.  Waiting for Rx to be delivered and will then be assisted to [facility]

## 2022-08-20 NOTE — Progress Notes (Signed)
Cardiology Office Note   Date:  08/25/2022   ID:  [name], [name] 03/09/1946, MRN [id number]  PCP:  Ileana Ladd, MD (Inactive)  Cardiologist:  Terina Mcelhinny [place], MD EP: None  Chief Complaint  Patient presents with   Coronary Artery Disease   Atrial Fibrillation        History of Present Illness: [name] is a 76 y.o. female with a PMH of CAD s/p PCI to OM1 in 1990s and PCI to RCA in 2000, HTN, HLD, paroxysmal atrial fibrillation not on anticoagulation, breast cancer, diverticulum of the esophagus c/b fistula/perforation, COPD, and tobacco abuse, who presents for follow up   Cardiac history dates back to the 1990s when she had an MI managed with PCI to OM1.  She then had another MI in 2000 and underwent PCI to RCA at that time.  Her last cardiac cath was in 2010 and looked good according to Dr. [place].  Her last ischemic evaluation was a nuclear stress test in 2015 which was low risk.  She had an echocardiogram in 2015 showing EF 60 to 65% with moderate MR.  She is noted to have an isolated episode of paroxysmal atrial fibrillation in 2015 in the setting of sepsis.  Anticoagulation was not felt to be needed going forward.  In addition to her cardiac history she has had spinal surgeries for management of cervical discitis and spinal stenosis.  She has also had bilateral hip replacements, one in 2016 for management of a dislocated hip, and again in 2020 for the right hip after a fall.  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  She had a gastrostomy tube placed.  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  She was last seen in October 2021 was doing well from a cardiac standpoint without any anginal complaints (anginal equivalent is shoulder pain), though unfortunately had resumed smoking.  No  medication changes occurred at that visit.  She was seen in August with recurrent arm pain. Bilateral. Myoview was done and was normal. Echo was also unremarkable with only mild MR.   She was admitted in November for C. Diff colitis. Also had respiratory failure- multifactorial and mental status changes. Gradually improved and able to DC home.   On follow up today she is doing well from a cardiac standpoint. No chest pain or dyspnea. She is having severe chronic back pain and bilateral arm pain. Sleeps sitting up in a chair. Has left high pain. LE dopplers normal earlier this year. Bowels are now normal. Appetite ok. Still smoking.      Past Medical History:  Diagnosis Date   Asthma    Breast cancer (HCC)    right breast   COPD (chronic obstructive pulmonary disease) (HCC)    Coronary artery disease    Diverticulum of esophagus    Elevated LFTs    Emphysema lung (HCC)    ETOH abuse    H/O atrial fibrillation without current medication    only one time when she had sepsis   Hyperlipidemia    Hypertension    hx of but not on any medications   Myocardial infarction (HCC) 2000   OA (osteoarthritis) of knee    Osteoarthritis    Tobacco abuse     Past Surgical History:  Procedure Laterality Date   ANTERIOR HIP REVISION Right  10/22/2015   Procedure: RIGHT  HIP REVISION;  Surgeon: Durene Romans, MD;  Location: [facility];  Service: Orthopedics;  Laterality: Right;   APPLICATION OF WOUND VAC N/A 06/20/2015   Procedure: APPLICATION OF INCISIONAL WOUND VAC;  Surgeon: Venita Lick, MD;  Location: [facility] OR;  Service: Orthopedics;  Laterality: N/A;   BREAST SURGERY  1991   right mastectomy   CARDIAC CATHETERIZATION  04/05/2009   EF 60%   CARDIOVASCULAR STRESS TEST  01/31/2005   EF 58%   CESAREAN SECTION  '78, '80, '81   x 3   CORONARY ANGIOPLASTY  08/1998   x2 OF A BIFURCATION OM-1, OM-2 LESION   CORONARY ANGIOPLASTY WITH STENT PLACEMENT  01/1999   MID FIRST OBTUSE MARGINAL VESSEL   CORONARY  ANGIOPLASTY WITH STENT PLACEMENT  07/1999   STENTING AT THE CRUX OF THE RIGHT CORONARY ARTERY WITH A 3.8MM X TETRA STENT   DIRECT LARYNGOSCOPY N/A 05/03/2015   Procedure: DIRECT LARYNGOSCOPY;  Surgeon: Flo Shanks, MD;  Location: [facility] OR;  Service: ENT;  Laterality: N/A;   EYE SURGERY  05/18/2014,06/01/2014   BILATERAL CATARACT S WITH LENS IMPLANTS   GASTROSTOMY N/A 05/04/2015   Procedure: OPEN GASTROSTOMY WITH TUBE PLACEMENT;  Surgeon: Manus Rudd, MD;  Location: [facility] OR;  Service: General;  Laterality: N/A;   GASTROSTOMY N/A 11/13/2016   Procedure: INSERTION OF GASTROSTOMY TUBE;  Surgeon: Abigail Miyamoto, MD;  Location: [facility] OR;  Service: General;  Laterality: N/A;   HARDWARE REMOVAL N/A 05/03/2015   Procedure: HARDWARE REMOVAL;  Surgeon: Venita Lick, MD;  Location: [facility] OR;  Service: Orthopedics;  Laterality: N/A;   HIP CLOSED REDUCTION Right 04/26/2015   Procedure: CLOSED REDUCTION HIP;  Surgeon: Venita Lick, MD;  Location: [facility];  Service: Orthopedics;  Laterality: Right;   HYSTEROSCOPY     D & C   INCISION AND DRAINAGE ABSCESS N/A 05/03/2015   Procedure: INCISION AND DRAINAGE CERVICAL  ABSCESS AND REMOVAL OF HARDWARE;  Surgeon: Venita Lick, MD;  Location: [facility];  Service: Orthopedics;  Laterality: N/A;   IR CM INJ ANY COLONIC TUBE W/FLUORO  10/14/2017   IR CM INJ ANY COLONIC TUBE W/FLUORO  10/23/2017   IR CM INJ ANY COLONIC TUBE W/FLUORO  10/28/2017   IR REPLC GASTRO/COLONIC TUBE PERCUT W/FLUORO  09/22/2017   JOINT REPLACEMENT  08/2011   bilateral hip   JOINT REPLACEMENT  01/2012   right hip   MASTECTOMY     neck fusion  2011   ORIF FEMUR FRACTURE Right 06/14/2019   Procedure: ORIF PERI PROSTHETIC FEMUR FRACTURE;  Surgeon: Durene Romans, MD;  Location: [facility] OR;  Service: Orthopedics;  Laterality: Right;   PELVIC LAPAROSCOPY  2002   RSO-     RADICAL NECK DISSECTION N/A 11/08/2016   Procedure: INCISION AND DRAINAGE OF NECK ABSCESS;  Surgeon: Osborn Coho, MD;  Location: [facility] OR;   Service: ENT;  Laterality: N/A;   RADIOLOGY WITH ANESTHESIA Right 06/28/2015   Procedure: MRI OF CERVICAL SPINE  AND RIGHT HIP  WITH AND WITHOUT CONTRAST    (RADIOLOGY WITH ANESTHESIA);  Surgeon: Medication Radiologist, MD;  Location: [facility] OR;  Service: Radiology;  Laterality: Right;   REMOVAL OF GASTROSTOMY TUBE N/A 11/14/2016   Procedure: REMOVAL OF GASTROSTOMY TUBE W/ REPLACEMENT OF GASTROSTOMY TUBE;  Surgeon: Abigail Miyamoto, MD;  Location: [facility] OR;  Service: General;  Laterality: N/A;   RIGID ESOPHAGOSCOPY N/A 05/03/2015   Procedure: RIGID ESOPHAGOSCOPY;  Surgeon: Flo Shanks, MD;  Location: [facility] OR;  Service: ENT;  Laterality: N/A;   TONSILLECTOMY AND ADENOIDECTOMY     TOTAL HIP ARTHROPLASTY  08/2010   bilat   VULVECTOMY  1981   partial     Current Outpatient Medications  Medication Sig Dispense Refill   albuterol (PROVENTIL HFA;VENTOLIN HFA) 108 (90 BASE) MCG/ACT inhaler Inhale 2 puffs into the lungs every 6 (six) hours as needed for wheezing.      ascorbic acid (VITAMIN C) 500 MG tablet Take 500 mg by mouth daily.     aspirin EC 81 MG tablet Take 1 tablet (81 mg total) by mouth daily.     BIOTIN EXTRA STRENGTH PO Take 500 mg by mouth daily.     cholecalciferol (VITAMIN D3) 25 MCG (1000 UNIT) tablet Take 1,000 Units by mouth daily.     gabapentin (NEURONTIN) 600 MG tablet Take 600 mg by mouth 4 (four) times daily.      losartan (COZAAR) 50 MG tablet Take 1 tablet (50 mg total) by mouth daily. 90 tablet 1   metFORMIN (GLUCOPHAGE-XR) 500 MG 24 hr tablet Take 500 mg by mouth every evening.     morphine (MS CONTIN) 30 MG 12 hr tablet Take 30 mg by mouth 4 (four) times daily.      Multiple Vitamin (MULTIVITAMIN) capsule Take 1 capsule by mouth daily. Centrum silver     oxyCODONE (ROXICODONE) 15 MG immediate release tablet Take 15 mg by mouth 4 (four) times daily.     rosuvastatin (CRESTOR) 10 MG tablet Take 1 tablet (10 mg total) by mouth daily. Please keep scheduled appointment 90 tablet 0    furosemide (LASIX) 20 MG tablet Take 1 tablet (20 mg total) by mouth daily as needed for edema. 90 tablet 3   No current facility-administered medications for this visit.    Allergies:   Azithromycin, Cefuroxime, Cephalexin, Chlorhexidine, Prednisone, and Zithromax [azithromycin dihydrate]    Social History:  The patient  reports that she has quit smoking. Her smoking use included cigarettes. She has a 50.00 pack-year smoking history. She has never used smokeless tobacco. She reports that she does not drink alcohol and does not use drugs.   Family History:  The patient's family history includes Diabetes in her mother; Heart attack in her father; Heart disease in her father; Hypertension in her father; Stroke in her father.    ROS:  Please see the history of present illness.   Otherwise, review of systems are positive for none.   All other systems are reviewed and negative.    PHYSICAL EXAM: VS:  BP (!) 143/81   Pulse 88   Ht 5\' 1"  (1.549 m)   Wt 133 lb 6.4 oz (60.5 kg)   BMI 25.21 kg/m  , BMI Body mass index is 25.21 kg/m. GEN: Well nourished, well developed, in no acute distress HEENT: sclera anicteric Neck: no JVD, carotid bruits, or masses Cardiac: RRR; no murmurs, rubs, or gallops, 1+LE edema  Respiratory:  clear to auscultation bilaterally, normal work of breathing GI: soft, nontender, nondistended, + BS MS: severe kyphoscoliosis.  Skin: warm and dry, no rash Neuro:  Strength and sensation are intact Psych: euthymic mood, full affect   EKG:  EKG is not ordered today.   Recent Labs: 08/09/2022: ALT 23 08/12/2022: BUN 18; Creatinine, Ser 0.46; Hemoglobin 13.1; Magnesium 1.9; Platelets 181; Potassium 3.5; Sodium 137    Lipid Panel    Component Value Date/Time   CHOL 135 12/20/2019 0839   TRIG 66 12/20/2019 0839   HDL 51 12/20/2019 0839  CHOLHDL 2.6 12/20/2019 0839   CHOLHDL 3 05/30/2011 0811   VLDL 17.2 05/30/2011 0811   LDLCALC 70 12/20/2019 0839    Dated  08/20/21: cholesterol 144, triglycerides 69, HDL 58, LDL 73. A1c 6.6%. glucose 143. Otherwise CMET normal.  Wt Readings from Last 3 Encounters:  08/25/22 133 lb 6.4 oz (60.5 kg)  08/11/22 116 lb 2.9 oz (52.7 kg)  02/24/22 143 lb 3.2 oz (65 kg)      Other studies Reviewed: Additional studies/ records that were reviewed today include:   Echocardiogram 2015: Study Conclusions   - Left ventricle: The cavity size was normal. Wall thickness was    increased in a pattern of mild LVH. Systolic function was normal.    The estimated ejection fraction was in the range of 60% to 65%.  - Mitral valve: Poorly visualized in apical views. MAC wtih    restricted posterior leaflet motion and likely moderate MR.  - Left atrium: The atrium was mildly dilated.  - Pulmonary arteries: PA peak pressure: 39 mm Hg (S).  - Pericardium, extracardiac: Likely large epicardial fat pad and    small apical effusion Consider CT to further assess pericardial    space.  - Impressions: Overall image quality is poor with non diagnostic    apical views.   Impressions:   - Overall image quality is poor with non diagnostic apical views.   NST 2015: Impression Exercise Capacity:  Lexiscan with no exercise. BP Response:  Normal blood pressure response. Clinical Symptoms:  There is dyspnea. ECG Impression:  Uninterpretable due to baseline changes. Comparison with Prior Nuclear Study: Compared to 06/24/11, no significant change.   Overall Impression:  Normal stress nuclear study.   LV Wall Motion:  NL LV Function; NL Wall Motion  Myoview 06/06/21: Study Highlights      The study is normal. The study is low risk.   No ST deviation was noted.   Left ventricular function is normal. Nuclear stress EF: 58 %. The left ventricular ejection fraction is normal (55-65%). End diastolic cavity size is normal.   Prior study available for comparison from 07/27/2014.   Low risk stress nuclear study with normal perfusion and  normal left ventricular regional and global systolic function.   Echo 06/06/21: IMPRESSIONS     1. Left ventricular ejection fraction, by estimation, is 65 to 70%. The  left ventricle has normal function. The left ventricle has no regional  wall motion abnormalities. Left ventricular diastolic parameters are  indeterminate.   2. Right ventricular systolic function is normal. The right ventricular  size is normal. There is normal pulmonary artery systolic pressure.   3. Left atrial size was moderately dilated.   4. Right atrial size was moderately dilated.   5. The mitral valve was not well visualized. Mild mitral valve  regurgitation. No evidence of mitral stenosis. Moderate to severe mitral  annular calcification.   6. The aortic valve is tricuspid. Aortic valve regurgitation is not  visualized. No aortic stenosis is present.   7. The inferior vena cava is normal in size with greater than 50%  respiratory variability, suggesting right atrial pressure of 3 mmHg.   Comparison(s): Mitral valve not well visualized, but MR appears mild  (prior moderate).    ASSESSMENT AND PLAN:  1. CAD s/p PCI to OM1 in 1990s and RCA in 2000:  - normal Myoview in September 2022.  - no active chest pain.   2. HTN: BP is well controlled.  - continue current  medication.   3. HLD: LDL 73  - Continue rosuvastatin  4. Paroxysmal atrial fibrillation: isolated episode in 2015 in the setting of sepsis, therefore anticoagulation not recommended. No known recurrence. In NSR during recent hospital stay.   5. Mitral regurgitation: moderate on echo in 2015 - repeat Echo in September 2022 with only mild MR  6. Tobacco abuse: - recommend smoking cessation  7. Severe kyphoscoliosis with severe back pain.. Follow up with Dr Ethelene Hal.   8. Recent hospitalization with C. Diff colitis and respiratory failure. Improved.    Current medicines are reviewed at length with the patient today.  The patient does not have  concerns regarding medicines.  The following changes have been made:  As above  Labs/ tests ordered today include:   No orders of the defined types were placed in this encounter.    Disposition:   FU 6 months  Signed, Ilamae Geng [place], MD  08/25/2022 11:08 AM

## 2022-08-21 DIAGNOSIS — Z23 Encounter for immunization: Secondary | ICD-10-CM | POA: Diagnosis not present

## 2022-08-21 DIAGNOSIS — Z0000 Encounter for general adult medical examination without abnormal findings: Secondary | ICD-10-CM | POA: Diagnosis not present

## 2022-08-22 DIAGNOSIS — E1169 Type 2 diabetes mellitus with other specified complication: Secondary | ICD-10-CM | POA: Diagnosis not present

## 2022-08-22 DIAGNOSIS — I10 Essential (primary) hypertension: Secondary | ICD-10-CM | POA: Diagnosis not present

## 2022-08-22 DIAGNOSIS — J449 Chronic obstructive pulmonary disease, unspecified: Secondary | ICD-10-CM | POA: Diagnosis not present

## 2022-08-22 DIAGNOSIS — I2510 Atherosclerotic heart disease of native coronary artery without angina pectoris: Secondary | ICD-10-CM | POA: Diagnosis not present

## 2022-08-22 DIAGNOSIS — D500 Iron deficiency anemia secondary to blood loss (chronic): Secondary | ICD-10-CM | POA: Diagnosis not present

## 2022-08-22 DIAGNOSIS — Z0000 Encounter for general adult medical examination without abnormal findings: Secondary | ICD-10-CM | POA: Diagnosis not present

## 2022-08-22 DIAGNOSIS — Z09 Encounter for follow-up examination after completed treatment for conditions other than malignant neoplasm: Secondary | ICD-10-CM | POA: Diagnosis not present

## 2022-08-22 DIAGNOSIS — F17200 Nicotine dependence, unspecified, uncomplicated: Secondary | ICD-10-CM | POA: Diagnosis not present

## 2022-08-22 DIAGNOSIS — E782 Mixed hyperlipidemia: Secondary | ICD-10-CM | POA: Diagnosis not present

## 2022-08-22 DIAGNOSIS — E46 Unspecified protein-calorie malnutrition: Secondary | ICD-10-CM | POA: Diagnosis not present

## 2022-08-25 ENCOUNTER — Ambulatory Visit: Payer: Medicare Other | Attending: Cardiology | Admitting: Cardiology

## 2022-08-25 ENCOUNTER — Encounter: Payer: Self-pay | Admitting: Cardiology

## 2022-08-25 VITALS — BP 143/81 | HR 88 | Ht 61.0 in | Wt 133.4 lb

## 2022-08-25 DIAGNOSIS — I25118 Atherosclerotic heart disease of native coronary artery with other forms of angina pectoris: Secondary | ICD-10-CM

## 2022-08-25 DIAGNOSIS — Z720 Tobacco use: Secondary | ICD-10-CM | POA: Diagnosis not present

## 2022-08-25 DIAGNOSIS — I480 Paroxysmal atrial fibrillation: Secondary | ICD-10-CM

## 2022-08-25 DIAGNOSIS — I10 Essential (primary) hypertension: Secondary | ICD-10-CM | POA: Diagnosis not present

## 2022-09-05 ENCOUNTER — Inpatient Hospital Stay (HOSPITAL_COMMUNITY)
Admission: EM | Admit: 2022-09-05 | Discharge: 2022-09-07 | DRG: 065 | Disposition: A | Discharge status: Home / Self Care | Payer: Medicare Other | Attending: Family Medicine | Admitting: Family Medicine

## 2022-09-05 ENCOUNTER — Encounter (HOSPITAL_COMMUNITY): Payer: Self-pay

## 2022-09-05 ENCOUNTER — Emergency Department (HOSPITAL_COMMUNITY): Payer: Medicare Other

## 2022-09-05 ENCOUNTER — Other Ambulatory Visit: Payer: Self-pay

## 2022-09-05 DIAGNOSIS — E785 Hyperlipidemia, unspecified: Secondary | ICD-10-CM | POA: Diagnosis present

## 2022-09-05 DIAGNOSIS — R42 Dizziness and giddiness: Principal | ICD-10-CM

## 2022-09-05 DIAGNOSIS — I6389 Other cerebral infarction: Secondary | ICD-10-CM | POA: Diagnosis not present

## 2022-09-05 DIAGNOSIS — K219 Gastro-esophageal reflux disease without esophagitis: Secondary | ICD-10-CM | POA: Diagnosis not present

## 2022-09-05 DIAGNOSIS — K8020 Calculus of gallbladder without cholecystitis without obstruction: Secondary | ICD-10-CM | POA: Diagnosis not present

## 2022-09-05 DIAGNOSIS — Z888 Allergy status to other drugs, medicaments and biological substances status: Secondary | ICD-10-CM | POA: Diagnosis not present

## 2022-09-05 DIAGNOSIS — I639 Cerebral infarction, unspecified: Secondary | ICD-10-CM | POA: Diagnosis not present

## 2022-09-05 DIAGNOSIS — M7989 Other specified soft tissue disorders: Secondary | ICD-10-CM | POA: Diagnosis not present

## 2022-09-05 DIAGNOSIS — G8929 Other chronic pain: Secondary | ICD-10-CM | POA: Diagnosis not present

## 2022-09-05 DIAGNOSIS — E860 Dehydration: Secondary | ICD-10-CM | POA: Diagnosis not present

## 2022-09-05 DIAGNOSIS — R000 Tachycardia, unspecified: Secondary | ICD-10-CM | POA: Diagnosis not present

## 2022-09-05 DIAGNOSIS — Z823 Family history of stroke: Secondary | ICD-10-CM

## 2022-09-05 DIAGNOSIS — Z96643 Presence of artificial hip joint, bilateral: Secondary | ICD-10-CM | POA: Diagnosis present

## 2022-09-05 DIAGNOSIS — E119 Type 2 diabetes mellitus without complications: Secondary | ICD-10-CM | POA: Diagnosis present

## 2022-09-05 DIAGNOSIS — Z716 Tobacco abuse counseling: Secondary | ICD-10-CM

## 2022-09-05 DIAGNOSIS — Z981 Arthrodesis status: Secondary | ICD-10-CM

## 2022-09-05 DIAGNOSIS — Z881 Allergy status to other antibiotic agents status: Secondary | ICD-10-CM

## 2022-09-05 DIAGNOSIS — R270 Ataxia, unspecified: Secondary | ICD-10-CM | POA: Diagnosis not present

## 2022-09-05 DIAGNOSIS — R531 Weakness: Secondary | ICD-10-CM | POA: Diagnosis not present

## 2022-09-05 DIAGNOSIS — I2510 Atherosclerotic heart disease of native coronary artery without angina pectoris: Secondary | ICD-10-CM | POA: Diagnosis not present

## 2022-09-05 DIAGNOSIS — R29810 Facial weakness: Secondary | ICD-10-CM | POA: Diagnosis present

## 2022-09-05 DIAGNOSIS — J439 Emphysema, unspecified: Secondary | ICD-10-CM | POA: Diagnosis not present

## 2022-09-05 DIAGNOSIS — Z833 Family history of diabetes mellitus: Secondary | ICD-10-CM

## 2022-09-05 DIAGNOSIS — Z7984 Long term (current) use of oral hypoglycemic drugs: Secondary | ICD-10-CM

## 2022-09-05 DIAGNOSIS — F17210 Nicotine dependence, cigarettes, uncomplicated: Secondary | ICD-10-CM | POA: Diagnosis not present

## 2022-09-05 DIAGNOSIS — N179 Acute kidney failure, unspecified: Secondary | ICD-10-CM | POA: Diagnosis present

## 2022-09-05 DIAGNOSIS — Z961 Presence of intraocular lens: Secondary | ICD-10-CM | POA: Diagnosis present

## 2022-09-05 DIAGNOSIS — I10 Essential (primary) hypertension: Secondary | ICD-10-CM | POA: Diagnosis not present

## 2022-09-05 DIAGNOSIS — I63541 Cerebral infarction due to unspecified occlusion or stenosis of right cerebellar artery: Principal | ICD-10-CM | POA: Diagnosis present

## 2022-09-05 DIAGNOSIS — E1165 Type 2 diabetes mellitus with hyperglycemia: Secondary | ICD-10-CM | POA: Diagnosis present

## 2022-09-05 DIAGNOSIS — Z7982 Long term (current) use of aspirin: Secondary | ICD-10-CM

## 2022-09-05 DIAGNOSIS — I252 Old myocardial infarction: Secondary | ICD-10-CM

## 2022-09-05 DIAGNOSIS — N200 Calculus of kidney: Secondary | ICD-10-CM | POA: Diagnosis not present

## 2022-09-05 DIAGNOSIS — Z853 Personal history of malignant neoplasm of breast: Secondary | ICD-10-CM | POA: Diagnosis not present

## 2022-09-05 DIAGNOSIS — Z1152 Encounter for screening for COVID-19: Secondary | ICD-10-CM

## 2022-09-05 DIAGNOSIS — Z79891 Long term (current) use of opiate analgesic: Secondary | ICD-10-CM

## 2022-09-05 DIAGNOSIS — M1990 Unspecified osteoarthritis, unspecified site: Secondary | ICD-10-CM | POA: Diagnosis not present

## 2022-09-05 DIAGNOSIS — R0902 Hypoxemia: Secondary | ICD-10-CM | POA: Diagnosis not present

## 2022-09-05 DIAGNOSIS — Z8249 Family history of ischemic heart disease and other diseases of the circulatory system: Secondary | ICD-10-CM

## 2022-09-05 DIAGNOSIS — Z955 Presence of coronary angioplasty implant and graft: Secondary | ICD-10-CM

## 2022-09-05 DIAGNOSIS — I959 Hypotension, unspecified: Secondary | ICD-10-CM | POA: Diagnosis not present

## 2022-09-05 DIAGNOSIS — Z79899 Other long term (current) drug therapy: Secondary | ICD-10-CM

## 2022-09-05 DIAGNOSIS — R55 Syncope and collapse: Secondary | ICD-10-CM | POA: Diagnosis not present

## 2022-09-05 DIAGNOSIS — Z9011 Acquired absence of right breast and nipple: Secondary | ICD-10-CM

## 2022-09-05 DIAGNOSIS — I63213 Cerebral infarction due to unspecified occlusion or stenosis of bilateral vertebral arteries: Secondary | ICD-10-CM | POA: Diagnosis not present

## 2022-09-05 LAB — CBC WITH DIFFERENTIAL/PLATELET
Abs Immature Granulocytes: 0.09 10*3/uL — ABNORMAL HIGH (ref 0.00–0.07)
Basophils Absolute: 0.1 10*3/uL (ref 0.0–0.1)
Basophils Relative: 0 %
Eosinophils Absolute: 0.1 10*3/uL (ref 0.0–0.5)
Eosinophils Relative: 1 %
HCT: 44.4 % (ref 36.0–46.0)
Hemoglobin: 14.8 g/dL (ref 12.0–15.0)
Immature Granulocytes: 1 %
Lymphocytes Relative: 13 %
Lymphs Abs: 1.7 10*3/uL (ref 0.7–4.0)
MCH: 30.7 pg (ref 26.0–34.0)
MCHC: 33.3 g/dL (ref 30.0–36.0)
MCV: 92.1 fL (ref 80.0–100.0)
Monocytes Absolute: 1.4 10*3/uL — ABNORMAL HIGH (ref 0.1–1.0)
Monocytes Relative: 10 %
Neutro Abs: 9.8 10*3/uL — ABNORMAL HIGH (ref 1.7–7.7)
Neutrophils Relative %: 75 %
Platelets: 185 10*3/uL (ref 150–400)
RBC: 4.82 MIL/uL (ref 3.87–5.11)
RDW: 14 % (ref 11.5–15.5)
WBC: 13.1 10*3/uL — ABNORMAL HIGH (ref 4.0–10.5)
nRBC: 0 % (ref 0.0–0.2)

## 2022-09-05 LAB — COMPREHENSIVE METABOLIC PANEL
ALT: 21 U/L (ref 0–44)
AST: 28 U/L (ref 15–41)
Albumin: 3.7 g/dL (ref 3.5–5.0)
Alkaline Phosphatase: 71 U/L (ref 38–126)
Anion gap: 13 (ref 5–15)
BUN: 32 mg/dL — ABNORMAL HIGH (ref 8–23)
CO2: 26 mmol/L (ref 22–32)
Calcium: 9.1 mg/dL (ref 8.9–10.3)
Chloride: 98 mmol/L (ref 98–111)
Creatinine, Ser: 1.3 mg/dL — ABNORMAL HIGH (ref 0.44–1.00)
GFR, Estimated: 43 mL/min — ABNORMAL LOW (ref >60)
Glucose, Bld: 179 mg/dL — ABNORMAL HIGH (ref 70–99)
Potassium: 3.6 mmol/L (ref 3.5–5.1)
Sodium: 137 mmol/L (ref 135–145)
Total Bilirubin: 0.8 mg/dL (ref 0.3–1.2)
Total Protein: 6.9 g/dL (ref 6.5–8.1)

## 2022-09-05 LAB — URINALYSIS, ROUTINE W REFLEX MICROSCOPIC
Bilirubin Urine: NEGATIVE
Glucose, UA: NEGATIVE mg/dL
Hgb urine dipstick: NEGATIVE
Ketones, ur: NEGATIVE mg/dL
Nitrite: NEGATIVE
Protein, ur: 30 mg/dL — AB
Specific Gravity, Urine: 1.012 (ref 1.005–1.030)
pH: 6 (ref 5.0–8.0)

## 2022-09-05 LAB — RAPID URINE DRUG SCREEN, HOSP PERFORMED
Amphetamines: NOT DETECTED
Barbiturates: NOT DETECTED
Benzodiazepines: NOT DETECTED
Cocaine: NOT DETECTED
Opiates: POSITIVE — AB
Tetrahydrocannabinol: NOT DETECTED

## 2022-09-05 LAB — CBG MONITORING, ED: Glucose-Capillary: 83 mg/dL (ref 70–99)

## 2022-09-05 LAB — RESP PANEL BY RT-PCR (RSV, FLU A&B, COVID)  RVPGX2
Influenza A by PCR: NEGATIVE
Influenza B by PCR: NEGATIVE
Resp Syncytial Virus by PCR: NEGATIVE
SARS Coronavirus 2 by RT PCR: NEGATIVE

## 2022-09-05 LAB — TROPONIN I (HIGH SENSITIVITY)
Troponin I (High Sensitivity): 17 ng/L (ref <18)
Troponin I (High Sensitivity): 19 ng/L — ABNORMAL HIGH (ref <18)

## 2022-09-05 MED ORDER — LORAZEPAM 2 MG/ML IJ SOLN
1.0000 mg | Freq: Once | INTRAMUSCULAR | Status: DC | PRN
  Filled 2022-09-05: qty 1

## 2022-09-05 MED ORDER — MECLIZINE HCL 25 MG PO TABS
25.0000 mg | ORAL_TABLET | Freq: Once | ORAL | Status: AC
  Administered 2022-09-05: 25 mg via ORAL
  Filled 2022-09-05: qty 1

## 2022-09-05 MED ORDER — SODIUM CHLORIDE 0.9 % IV BOLUS
1000.0000 mL | Freq: Once | INTRAVENOUS | Status: AC
  Administered 2022-09-05: 1000 mL via INTRAVENOUS

## 2022-09-05 NOTE — ED Provider Notes (Signed)
[facility] DEPT Provider Note   CSN: [id number] Arrival date & time: 09/05/22  1831     History  Chief Complaint  Patient presents with   Near Syncope    [name] is a 76 y.o. female history of chronic pain, diabetes, here presenting with near syncope and hypotension.  Patient was recently admitted for encephalopathy and was found to have positive C. difficile.  Patient finished a course of p.o. vancomycin.  Patient states that she has not been feeling well for the last week or so.  She has some confusion and whenever she stands up she feels lightheaded and dizzy.  Patient apparently had a syncopal episode at home.  Her blood pressure was in the 70s at that time.  EMS noticed that she was hypotensive with blood pressure 90/47 and gave her some IV fluids.  Patient unable to give me much history.  Denies overdose on her pain medicine.  The history is provided by the patient and the EMS personnel.       Home Medications Prior to Admission medications   Medication Sig Start Date End Date Taking? Authorizing Provider  albuterol (PROVENTIL HFA;VENTOLIN HFA) 108 (90 BASE) MCG/ACT inhaler Inhale 2 puffs into the lungs every 6 (six) hours as needed for wheezing.     [provider]  ascorbic acid (VITAMIN C) 500 MG tablet Take 500 mg by mouth daily.    [provider]  aspirin EC 81 MG tablet Take 1 tablet (81 mg total) by mouth daily. 08/12/22   Dwyane Dee, MD  BIOTIN EXTRA STRENGTH PO Take 500 mg by mouth daily.    [provider]  cholecalciferol (VITAMIN D3) 25 MCG (1000 UNIT) tablet Take 1,000 Units by mouth daily.    [provider]  furosemide (LASIX) 20 MG tablet Take 1 tablet (20 mg total) by mouth daily as needed for edema. 12/27/19 08/08/22  [place], Peter M, MD  gabapentin (NEURONTIN) 600 MG tablet Take 600 mg by mouth 4 (four) times daily.     [provider]  losartan (COZAAR) 50 MG tablet Take 1  tablet (50 mg total) by mouth daily. 06/11/22   [place], Peter M, MD  metFORMIN (GLUCOPHAGE-XR) 500 MG 24 hr tablet Take 500 mg by mouth every evening.    [provider]  morphine (MS CONTIN) 30 MG 12 hr tablet Take 30 mg by mouth 4 (four) times daily.  12/23/16   [provider]  Multiple Vitamin (MULTIVITAMIN) capsule Take 1 capsule by mouth daily. Centrum silver    [provider]  oxyCODONE (ROXICODONE) 15 MG immediate release tablet Take 15 mg by mouth 4 (four) times daily.    [provider]  rosuvastatin (CRESTOR) 10 MG tablet Take 1 tablet (10 mg total) by mouth daily. Please keep scheduled appointment 06/25/22   [place], Peter M, MD      Allergies    Azithromycin, Cefuroxime, Cephalexin, Chlorhexidine, Prednisone, and Zithromax [azithromycin dihydrate]    Review of Systems   Review of Systems  Neurological:  Positive for dizziness.  Psychiatric/Behavioral:  Positive for confusion.   All other systems reviewed and are negative.   Physical Exam Updated Vital Signs BP 126/77   Pulse 84   Temp (!) 97.5 F (36.4 C) (Oral)   Resp 15   Ht '5\' 7"'$  (1.702 m)   Wt 54.4 kg   SpO2 93%   BMI 18.79 kg/m  Physical Exam Vitals and nursing note reviewed.  Constitutional:  Comments: Chronically ill and dehydrated  HENT:     Head: Normocephalic.     Nose: Nose normal.     Mouth/Throat:     Mouth: Mucous membranes are dry.  Eyes:     Extraocular Movements: Extraocular movements intact.     Pupils: Pupils are equal, round, and reactive to light.  Cardiovascular:     Rate and Rhythm: Normal rate and regular rhythm.     Pulses: Normal pulses.     Heart sounds: Normal heart sounds.  Pulmonary:     Effort: Pulmonary effort is normal.     Breath sounds: Normal breath sounds.  Abdominal:     General: Abdomen is flat.     Comments: Mild epigastric tenderness.  Musculoskeletal:        General: Normal range of motion.     Cervical back: Normal range  of motion and neck supple.  Skin:    General: Skin is warm.     Capillary Refill: Capillary refill takes less than 2 seconds.  Neurological:     General: No focal deficit present.  Psychiatric:        Mood and Affect: Mood normal.        Behavior: Behavior normal.     ED Results / Procedures / Treatments   Labs (all labs ordered are listed, but only abnormal results are displayed) Labs Reviewed  CBC WITH DIFFERENTIAL/PLATELET - Abnormal; Notable for the following components:      Result Value   WBC 13.1 (*)    Neutro Abs 9.8 (*)    Monocytes Absolute 1.4 (*)    Abs Immature Granulocytes 0.09 (*)    All other components within normal limits  RESP PANEL BY RT-PCR (RSV, FLU A&B, COVID)  [id number]  URINALYSIS, ROUTINE W REFLEX MICROSCOPIC  COMPREHENSIVE METABOLIC PANEL  CBG MONITORING, ED  TROPONIN I (HIGH SENSITIVITY)    EKG EKG Interpretation  Date/Time:  Friday September 05 2022 18:53:00 EST Ventricular Rate:  87 PR Interval:  136 QRS Duration: 138 QT Interval:  436 QTC Calculation: 525 R Axis:   77 Text Interpretation: Sinus rhythm LVH with secondary repolarization abnormality ST depression, consider ischemia, diffuse lds Prolonged QT interval prolonged QT new since previous Confirmed by Wandra Arthurs [id number]) on 09/05/2022 7:30:45 PM  Radiology No results found.  Procedures Procedures    Medications Ordered in ED Medications  sodium chloride 0.9 % bolus 1,000 mL (1,000 mLs Intravenous New Bag/Given 09/05/22 1929)    ED Course/ Medical Decision Making/ A&P                           Medical Decision Making [name] is a 76 y.o. female here presenting with syncope.  Patient recently was diagnosed with C. difficile.  Patient is here with hypotension.  Patient appears altered as well.  Concern for renal failure versus recurrent C. difficile versus dehydration.  Plan to get CBC and CMP and CT head and CT abdomen pelvis.  Patient will need to be admitted for IV  fluids and hypotension.  9:02 PM I reviewed patient's labs and dependently interpreted imaging studies.  Patient's creatinine is 1.3 which is more elevated compared to her baseline 0.5.  Patient appears dehydrated.  Her blood pressure is normal right now.  However patient is still very dizzy upon standing.  Her CT scan showed acute to subacute infarct.  However, we are unable to get MRI here tonight.  Plan to  get MRI brain.  If patient is unable to ambulate after getting meclizine, anticipate that patient will be admitted for mild AKI and persistent dizziness.  If MRI does not show stroke and patient is steady, anticipate that patient can go home.   Amount and/or Complexity of Data Reviewed Labs: ordered. Radiology: ordered.  Risk Prescription drug management.    Final Clinical Impression(s) / ED Diagnoses Final diagnoses:  None    Rx / DC Orders ED Discharge Orders     None         Drenda Freeze, MD 09/05/22 2106

## 2022-09-05 NOTE — ED Triage Notes (Signed)
Arrived from home via EMS, for syncopal episode, pt was confused, systolic in 70, given fluids, pt AAOx4 after bolius per EMS. Recently diagnosed for c- diff was given vancomycin, 18 in left AC.   Beverlyn Roux

## 2022-09-05 NOTE — ED Triage Notes (Signed)
Last set of vitals 90HR, O2 96%, 130 CBG, 90/47 BP.   [name]

## 2022-09-06 ENCOUNTER — Inpatient Hospital Stay (HOSPITAL_COMMUNITY): Payer: Medicare Other

## 2022-09-06 ENCOUNTER — Emergency Department (HOSPITAL_COMMUNITY): Payer: Medicare Other

## 2022-09-06 DIAGNOSIS — E860 Dehydration: Secondary | ICD-10-CM | POA: Diagnosis present

## 2022-09-06 DIAGNOSIS — R270 Ataxia, unspecified: Secondary | ICD-10-CM | POA: Diagnosis present

## 2022-09-06 DIAGNOSIS — I10 Essential (primary) hypertension: Secondary | ICD-10-CM | POA: Diagnosis present

## 2022-09-06 DIAGNOSIS — F17210 Nicotine dependence, cigarettes, uncomplicated: Secondary | ICD-10-CM | POA: Diagnosis present

## 2022-09-06 DIAGNOSIS — K219 Gastro-esophageal reflux disease without esophagitis: Secondary | ICD-10-CM | POA: Diagnosis present

## 2022-09-06 DIAGNOSIS — I639 Cerebral infarction, unspecified: Secondary | ICD-10-CM | POA: Diagnosis present

## 2022-09-06 DIAGNOSIS — Z853 Personal history of malignant neoplasm of breast: Secondary | ICD-10-CM | POA: Diagnosis not present

## 2022-09-06 DIAGNOSIS — N179 Acute kidney failure, unspecified: Secondary | ICD-10-CM

## 2022-09-06 DIAGNOSIS — E1165 Type 2 diabetes mellitus with hyperglycemia: Secondary | ICD-10-CM

## 2022-09-06 DIAGNOSIS — I2510 Atherosclerotic heart disease of native coronary artery without angina pectoris: Secondary | ICD-10-CM | POA: Diagnosis present

## 2022-09-06 DIAGNOSIS — Z881 Allergy status to other antibiotic agents status: Secondary | ICD-10-CM | POA: Diagnosis not present

## 2022-09-06 DIAGNOSIS — Z79891 Long term (current) use of opiate analgesic: Secondary | ICD-10-CM | POA: Diagnosis not present

## 2022-09-06 DIAGNOSIS — M7989 Other specified soft tissue disorders: Secondary | ICD-10-CM | POA: Diagnosis not present

## 2022-09-06 DIAGNOSIS — R29810 Facial weakness: Secondary | ICD-10-CM | POA: Diagnosis present

## 2022-09-06 DIAGNOSIS — Z7982 Long term (current) use of aspirin: Secondary | ICD-10-CM | POA: Diagnosis not present

## 2022-09-06 DIAGNOSIS — Z1152 Encounter for screening for COVID-19: Secondary | ICD-10-CM | POA: Diagnosis not present

## 2022-09-06 DIAGNOSIS — Z79899 Other long term (current) drug therapy: Secondary | ICD-10-CM | POA: Diagnosis not present

## 2022-09-06 DIAGNOSIS — I63541 Cerebral infarction due to unspecified occlusion or stenosis of right cerebellar artery: Secondary | ICD-10-CM | POA: Diagnosis present

## 2022-09-06 DIAGNOSIS — E785 Hyperlipidemia, unspecified: Secondary | ICD-10-CM

## 2022-09-06 DIAGNOSIS — M1990 Unspecified osteoarthritis, unspecified site: Secondary | ICD-10-CM | POA: Diagnosis present

## 2022-09-06 DIAGNOSIS — G8929 Other chronic pain: Secondary | ICD-10-CM | POA: Diagnosis present

## 2022-09-06 DIAGNOSIS — I6389 Other cerebral infarction: Secondary | ICD-10-CM

## 2022-09-06 DIAGNOSIS — J439 Emphysema, unspecified: Secondary | ICD-10-CM | POA: Diagnosis present

## 2022-09-06 DIAGNOSIS — Z7984 Long term (current) use of oral hypoglycemic drugs: Secondary | ICD-10-CM | POA: Diagnosis not present

## 2022-09-06 DIAGNOSIS — I252 Old myocardial infarction: Secondary | ICD-10-CM | POA: Diagnosis not present

## 2022-09-06 DIAGNOSIS — I9589 Other hypotension: Secondary | ICD-10-CM

## 2022-09-06 DIAGNOSIS — R42 Dizziness and giddiness: Secondary | ICD-10-CM | POA: Diagnosis present

## 2022-09-06 DIAGNOSIS — I959 Hypotension, unspecified: Secondary | ICD-10-CM | POA: Diagnosis present

## 2022-09-06 DIAGNOSIS — Z888 Allergy status to other drugs, medicaments and biological substances status: Secondary | ICD-10-CM | POA: Diagnosis not present

## 2022-09-06 DIAGNOSIS — I63213 Cerebral infarction due to unspecified occlusion or stenosis of bilateral vertebral arteries: Secondary | ICD-10-CM | POA: Diagnosis not present

## 2022-09-06 LAB — ECHOCARDIOGRAM COMPLETE
AR max vel: 2.85 cm2
AV Area VTI: 3.24 cm2
AV Area mean vel: 2.89 cm2
AV Mean grad: 3 mmHg
AV Peak grad: 5.9 mmHg
Ao pk vel: 1.21 m/s
Area-P 1/2: 3.37 cm2
Height: 67 in
S' Lateral: 2.5 cm
Weight: 1920 oz

## 2022-09-06 LAB — GLUCOSE, CAPILLARY: Glucose-Capillary: 130 mg/dL — ABNORMAL HIGH (ref 70–99)

## 2022-09-06 LAB — CBG MONITORING, ED: Glucose-Capillary: 77 mg/dL (ref 70–99)

## 2022-09-06 MED ORDER — SODIUM CHLORIDE 0.9 % IV SOLN: Freq: Once | INTRAVENOUS | Status: AC

## 2022-09-06 MED ORDER — OXYCODONE HCL 5 MG PO TABS
15.0000 mg | ORAL_TABLET | Freq: Four times a day (QID) | ORAL | Status: DC | PRN
  Administered 2022-09-06: 15 mg via ORAL
  Filled 2022-09-06: qty 3

## 2022-09-06 MED ORDER — CLOPIDOGREL BISULFATE 75 MG PO TABS
75.0000 mg | ORAL_TABLET | Freq: Every day | ORAL | Status: DC
  Administered 2022-09-06 – 2022-09-07 (×2): 75 mg via ORAL
  Filled 2022-09-06 (×2): qty 1

## 2022-09-06 MED ORDER — STROKE: EARLY STAGES OF RECOVERY BOOK
Freq: Once | Status: DC
  Filled 2022-09-06: qty 1

## 2022-09-06 MED ORDER — ACETAMINOPHEN 325 MG PO TABS: 650.0000 mg | ORAL_TABLET | ORAL | Status: DC | PRN

## 2022-09-06 MED ORDER — ENOXAPARIN SODIUM 40 MG/0.4ML IJ SOSY
40.0000 mg | PREFILLED_SYRINGE | INTRAMUSCULAR | Status: DC
  Administered 2022-09-06 – 2022-09-07 (×2): 40 mg via SUBCUTANEOUS
  Filled 2022-09-06 (×2): qty 0.4

## 2022-09-06 MED ORDER — ASPIRIN 81 MG PO TBEC
81.0000 mg | DELAYED_RELEASE_TABLET | Freq: Every day | ORAL | Status: DC
  Administered 2022-09-06 – 2022-09-07 (×2): 81 mg via ORAL
  Filled 2022-09-06 (×2): qty 1

## 2022-09-06 MED ORDER — IOHEXOL 350 MG/ML SOLN
50.0000 mL | Freq: Once | INTRAVENOUS | Status: AC | PRN
  Administered 2022-09-06: 50 mL via INTRAVENOUS

## 2022-09-06 MED ORDER — ROSUVASTATIN CALCIUM 5 MG PO TABS
10.0000 mg | ORAL_TABLET | Freq: Every day | ORAL | Status: DC
  Administered 2022-09-06 – 2022-09-07 (×2): 10 mg via ORAL
  Filled 2022-09-06 (×2): qty 2

## 2022-09-06 MED ORDER — OXYCODONE HCL 5 MG PO TABS
5.0000 mg | ORAL_TABLET | Freq: Four times a day (QID) | ORAL | Status: DC | PRN
  Administered 2022-09-07: 10 mg via ORAL
  Filled 2022-09-06: qty 2

## 2022-09-06 MED ORDER — INSULIN ASPART 100 UNIT/ML IJ SOLN: 0.0000 [IU] | Freq: Every day | INTRAMUSCULAR | Status: DC

## 2022-09-06 MED ORDER — ACETAMINOPHEN 160 MG/5ML PO SOLN: 650.0000 mg | ORAL | Status: DC | PRN

## 2022-09-06 MED ORDER — ACETAMINOPHEN 650 MG RE SUPP: 650.0000 mg | RECTAL | Status: DC | PRN

## 2022-09-06 MED ORDER — LACTATED RINGERS IV SOLN: INTRAVENOUS | Status: DC

## 2022-09-06 MED ORDER — INSULIN ASPART 100 UNIT/ML IJ SOLN: 0.0000 [IU] | Freq: Three times a day (TID) | INTRAMUSCULAR | Status: DC

## 2022-09-06 NOTE — ED Notes (Signed)
Pt refusing to keep cardiac cords on.

## 2022-09-06 NOTE — Progress Notes (Signed)
VASCULAR LAB    Bilateral lower extremity venous duplex has been performed.  See CV proc for preliminary results.   Tywanda Rice, RVT 09/06/2022, 11:23 AM

## 2022-09-06 NOTE — Progress Notes (Addendum)
STROKE TEAM PROGRESS NOTE   ATTENDING NOTE: I reviewed above note and agree with the assessment and plan. Pt was seen and examined.   76 year old female with history of hypertension, hyperlipidemia, diabetes, lone A-fib admitted for dizziness for 2 weeks with intermittent syncope.  Found to have low BP, hypotension and dehydration.  CT showed right cerebellum infarct.  MRI showed bilateral cerebellum, right MCA/PCA and MCA/ACA watershed infarcts.  CTA head and neck showed severe right ICA siphon stenosis, intracranial stenosis at bilateral MCA and ACA branches, bilateral V4 hypoplastic right more than left.  EF 55 to 60%, LE venous Doppler negative for DVT.  LDL and A1c pending.  Creatinine 1.3, elevated from baseline 0.4-0.5.  WBC 13.1.  UA negative.  On exam, patient AOx3, neurologic intact.  No focal deficits.  BP stabilized after IV fluid.  Etiology for patient's stroke likely due to hypoperfusion in the setting of intracranial stenosis, hypotension and dehydration.  Agree with aggressive IV fluid to treat AKI and dehydration.  Continue aspirin 81 and Plavix 75 DAPT for 3 weeks and then Plavix alone.  On Crestor 10.  Smoking cessation education provided.  PT/OT pending.  Will follow.  For detailed assessment and plan, please refer to above/below as I have made changes wherever appropriate.   Marvel Plan, MD PhD Stroke Neurology 09/06/2022 5:34 PM  I spent extra 30 inpatient minutes in total face-to-face time with the patient, more than 50% of which was spent in counseling and coordination of care, reviewing test results, images and medication, and discussing the diagnosis, treatment plan and potential prognosis. This patient's care requiresreview of multiple databases, neurological assessment, discussion with family, other specialists and medical decision making of high complexity.      INTERVAL HISTORY Patient is seen in the ED with no family at the bedside.  She states that while at home,  she became very dizzy and passed out.  She states that she had not been drinking as much as usual, and she may have become dehydrated.  On admission, she was noted to be very hypotensive with systolic blood pressure initially in the 70s.  CT head demonstrated subacute stroke in the right cerebellum.  MRI was performed showing patchy right occipital and bilateral cerebellar infarcts as well as infarcts along high right frontal parietal convexity.  CTA head and neck demonstrates atherosclerosis of bilateral ACA and MCA branches, making strokes likely the result of hypoperfusion in the setting of hypotension.  Vitals:   09/06/22 0112 09/06/22 0421 09/06/22 0800 09/06/22 0852  BP: 127/80 (!) 107/47  107/72  Pulse: 88 81  82  Resp: 18 17  18   Temp:   (!) 97.5 F (36.4 C) 98 F (36.7 C)  TempSrc:    Oral  SpO2: 94% 97%  95%  Weight:      Height:       CBC:  Recent Labs  Lab 09/05/22 1905  WBC 13.1*  NEUTROABS 9.8*  HGB 14.8  HCT 44.4  MCV 92.1  PLT 185   Basic Metabolic Panel:  Recent Labs  Lab 09/05/22 1905  NA 137  K 3.6  CL 98  CO2 26  GLUCOSE 179*  BUN 32*  CREATININE 1.30*  CALCIUM 9.1   Lipid Panel: No results for input(s): "CHOL", "TRIG", "HDL", "CHOLHDL", "VLDL", "LDLCALC" in the last 168 hours. HgbA1c: No results for input(s): "HGBA1C" in the last 168 hours. Urine Drug Screen:  Recent Labs  Lab 09/05/22 2209  LABOPIA POSITIVE*  COCAINSCRNUR NONE DETECTED  LABBENZ NONE DETECTED  AMPHETMU NONE DETECTED  THCU NONE DETECTED  LABBARB NONE DETECTED    Alcohol Level No results for input(s): "ETH" in the last 168 hours.  IMAGING past 24 hours ECHOCARDIOGRAM COMPLETE  Result Date: 09/06/2022    ECHOCARDIOGRAM REPORT   Patient Name:   KINLI WOLTZ Date of Exam: 09/06/2022 Medical Rec #:  [id number]      Height:       67.0 in Accession #:    [id number]     Weight:       120.0 lb Date of Birth:  [birth year]      BSA:          1.628 m Patient Age:    76 years       BP:            107/47 mmHg Patient Gender: F              HR:           76 bpm. Exam Location:  Inpatient Procedure: 2D Echo, Cardiac Doppler and Color Doppler Indications:    Stroke I63.9  History:        Patient has prior history of Echocardiogram examinations, most                 recent 06/06/2021. CAD and Previous Myocardial Infarction, COPD,                 Arrythmias:Atrial Fibrillation; Risk Factors:Hypertension,                 Diabetes and Current Smoker. Breast Cancer.  Sonographer:    Lucendia Herrlich Referring Phys: Lyda Perone, M  Sonographer Comments: Image acquisition challenging due to patient body habitus. IMPRESSIONS  1. Left ventricular ejection fraction, by estimation, is 55 to 60%. The left ventricle has normal function. The left ventricle has no regional wall motion abnormalities. There is mild left ventricular hypertrophy. Left ventricular diastolic parameters are consistent with Grade I diastolic dysfunction (impaired relaxation).  2. Right ventricular systolic function is normal. The right ventricular size is normal.  3. Left atrial size was mildly dilated.  4. The mitral valve is abnormal. Trivial mitral valve regurgitation. No evidence of mitral stenosis. Moderate mitral annular calcification.  5. The aortic valve is tricuspid. There is mild calcification of the aortic valve. Aortic valve regurgitation is not visualized. Aortic valve sclerosis is present, with no evidence of aortic valve stenosis.  6. The inferior vena cava is normal in size with greater than 50% respiratory variability, suggesting right atrial pressure of 3 mmHg. FINDINGS  Left Ventricle: Left ventricular ejection fraction, by estimation, is 55 to 60%. The left ventricle has normal function. The left ventricle has no regional wall motion abnormalities. The left ventricular internal cavity size was normal in size. There is  mild left ventricular hypertrophy. Left ventricular diastolic parameters are consistent with Grade I  diastolic dysfunction (impaired relaxation). Right Ventricle: The right ventricular size is normal. No increase in right ventricular wall thickness. Right ventricular systolic function is normal. Left Atrium: Left atrial size was mildly dilated. Right Atrium: Right atrial size was normal in size. Pericardium: There is no evidence of pericardial effusion. Mitral Valve: The mitral valve is abnormal. There is mild thickening of the mitral valve leaflet(s). There is mild calcification of the mitral valve leaflet(s). Moderate mitral annular calcification. Trivial mitral valve regurgitation. No evidence of mitral valve stenosis. Tricuspid Valve: The tricuspid valve is normal in structure. Tricuspid valve  regurgitation is trivial. No evidence of tricuspid stenosis. Aortic Valve: The aortic valve is tricuspid. There is mild calcification of the aortic valve. Aortic valve regurgitation is not visualized. Aortic valve sclerosis is present, with no evidence of aortic valve stenosis. Aortic valve mean gradient measures 3.0 mmHg. Aortic valve peak gradient measures 5.9 mmHg. Aortic valve area, by VTI measures 3.24 cm. Pulmonic Valve: The pulmonic valve was normal in structure. Pulmonic valve regurgitation is trivial. No evidence of pulmonic stenosis. Aorta: The aortic root is normal in size and structure. Venous: The inferior vena cava is normal in size with greater than 50% respiratory variability, suggesting right atrial pressure of 3 mmHg. IAS/Shunts: The interatrial septum was not well visualized.  LEFT VENTRICLE PLAX 2D LVIDd:         3.80 cm   Diastology LVIDs:         2.50 cm   LV e' medial:    5.66 cm/s LV PW:         1.20 cm   LV E/e' medial:  9.2 LV IVS:        1.20 cm   LV e' lateral:   11.20 cm/s LVOT diam:     2.20 cm   LV E/e' lateral: 4.7 LV SV:         67 LV SV Index:   41 LVOT Area:     3.80 cm                           3D Volume EF:                          3D EF:        53 %                          LV EDV:        97 ml                          LV ESV:       46 ml                          LV SV:        51 ml RIGHT VENTRICLE RV S prime:     8.92 cm/s TAPSE (M-mode): 1.1 cm LEFT ATRIUM           Index        RIGHT ATRIUM           Index LA diam:      4.10 cm 2.52 cm/m   RA Area:     15.00 cm LA Vol (A2C): 25.1 ml 15.42 ml/m  RA Volume:   37.40 ml  22.98 ml/m LA Vol (A4C): 25.0 ml 15.36 ml/m  AORTIC VALVE AV Area (Vmax):    2.85 cm AV Area (Vmean):   2.89 cm AV Area (VTI):     3.24 cm AV Vmax:           121.00 cm/s AV Vmean:          80.400 cm/s AV VTI:            0.206 m AV Peak Grad:      5.9 mmHg AV Mean Grad:      3.0 mmHg LVOT Vmax:  90.65 cm/s LVOT Vmean:        61.050 cm/s LVOT VTI:          0.176 m LVOT/AV VTI ratio: 0.85  AORTA Ao Root diam: 3.00 cm Ao Asc diam:  3.30 cm MITRAL VALVE               TRICUSPID VALVE MV Area (PHT): 3.37 cm    TR Peak grad:   20.6 mmHg MV Decel Time: 225 msec    TR Vmax:        227.00 cm/s MV E velocity: 52.10 cm/s MV A velocity: 80.20 cm/s  SHUNTS MV E/A ratio:  0.65        Systemic VTI:  0.18 m                            Systemic Diam: 2.20 cm Charlton Haws MD Electronically signed by Charlton Haws MD Signature Date/Time: 09/06/2022/1:14:13 PM    Final    VAS [place] LOWER EXTREMITY VENOUS (DVT)  Result Date: 09/06/2022  Lower Venous DVT Study Patient Name:  YULA ROSTRON  Date of Exam:   09/06/2022 Medical Rec #: [id number]       Accession #:    [id number] Date of Birth: Jan 20, 1946       Patient Gender: F Patient Age:   41 years Exam Location:  [facility] Procedure:      VAS [place] LOWER EXTREMITY VENOUS (DVT) Referring Phys: Scheryl Marten Arelly Whittenberg --------------------------------------------------------------------------------  Indications: Stroke, and Chronic bilateral lower extremity swelling.  Risk Factors: Hip surgery X 6. Comparison Study: No prior study Performing Technologist: Sherren Kerns RVS  Examination Guidelines: A complete evaluation includes B-mode imaging,  spectral Doppler, color Doppler, and power Doppler as needed of all accessible portions of each vessel. Bilateral testing is considered an integral part of a complete examination. Limited examinations for reoccurring indications may be performed as noted. The reflux portion of the exam is performed with the patient in reverse Trendelenburg.  +---------+---------------+---------+-----------+----------+--------------+ RIGHT    CompressibilityPhasicitySpontaneityPropertiesThrombus Aging +---------+---------------+---------+-----------+----------+--------------+ CFV      Full           Yes      Yes                                 +---------+---------------+---------+-----------+----------+--------------+ SFJ      Full                                                        +---------+---------------+---------+-----------+----------+--------------+ FV Prox  Full                                                        +---------+---------------+---------+-----------+----------+--------------+ FV Mid   Full                                                        +---------+---------------+---------+-----------+----------+--------------+ FV DistalFull                                                        +---------+---------------+---------+-----------+----------+--------------+  PFV      Full                                                        +---------+---------------+---------+-----------+----------+--------------+ POP      Full           Yes      Yes                                 +---------+---------------+---------+-----------+----------+--------------+ PTV      Full                                                        +---------+---------------+---------+-----------+----------+--------------+ PERO     Full                                                        +---------+---------------+---------+-----------+----------+--------------+    +---------+---------------+---------+-----------+----------+--------------+ LEFT     CompressibilityPhasicitySpontaneityPropertiesThrombus Aging +---------+---------------+---------+-----------+----------+--------------+ CFV      Full           Yes      Yes                                 +---------+---------------+---------+-----------+----------+--------------+ SFJ      Full                                                        +---------+---------------+---------+-----------+----------+--------------+ FV Prox  Full                                                        +---------+---------------+---------+-----------+----------+--------------+ FV Mid   Full                                                        +---------+---------------+---------+-----------+----------+--------------+ FV DistalFull                                                        +---------+---------------+---------+-----------+----------+--------------+ PFV      Full                                                        +---------+---------------+---------+-----------+----------+--------------+  POP      Full           Yes      Yes                                 +---------+---------------+---------+-----------+----------+--------------+ PTV      Full                                                        +---------+---------------+---------+-----------+----------+--------------+ PERO     Full                                                        +---------+---------------+---------+-----------+----------+--------------+    Summary: BILATERAL: - No evidence of deep vein thrombosis seen in the lower extremities, bilaterally. -No evidence of popliteal cyst, bilaterally.   *See table(s) above for measurements and observations.    Preliminary    CT ANGIO HEAD NECK W WO CM  Result Date: 09/06/2022 CLINICAL DATA:  Stroke follow-up EXAM: CT ANGIOGRAPHY HEAD AND NECK  TECHNIQUE: Multidetector CT imaging of the head and neck was performed using the standard protocol during bolus administration of intravenous contrast. Multiplanar CT image reconstructions and MIPs were obtained to evaluate the vascular anatomy. Carotid stenosis measurements (when applicable) are obtained utilizing NASCET criteria, using the distal internal carotid diameter as the denominator. RADIATION DOSE REDUCTION: This exam was performed according to the departmental dose-optimization program which includes automated exposure control, adjustment of the mA and/or kV according to patient size and/or use of iterative reconstruction technique. CONTRAST:  50mL OMNIPAQUE IOHEXOL 350 MG/ML SOLN COMPARISON:  Brain MRI from earlier today FINDINGS: CTA NECK FINDINGS Aortic arch: Atheromatous calcification with 3 vessel branching. No acute finding or dilatation. Right carotid system: Calcified plaque, mixed density and mainly at the bifurcation. No ulceration or flow limiting stenosis. Left carotid system: Postoperative left neck. Mild atheromatous plaque at the bifurcation without stenosis or ulceration. Vertebral arteries: No proximal subclavian flow limiting stenosis. Left dominant vertebral artery. The vertebral arteries are smoothly contoured and widely patent. Skeleton: Sequela of prior cervical spine fusion and infection with prevertebral scarring. Other neck: As above Upper chest: Biapical mainly subpleural scarring. Review of the MIP images confirms the above findings CTA HEAD FINDINGS Anterior circulation: Atheromatous calcification of the carotid siphons without suspected flow reducing stenosis, quantification limited by calcified plaque blooming and vessel size. Extensive atheromatous irregularity of medium size branches, advanced bilateral M2 and A2 stenoses. Negative for aneurysm Posterior circulation: Vertebral and basilar arteries are smoothly contoured and diffusely patent. Generalized atheromatous  irregularity of bilateral posterior cerebral arteries without proximal in correctable stenosis. Negative for aneurysm Venous sinuses: Unremarkable for the arterial phase. Anatomic variants: None significant Review of the MIP images confirms the above findings IMPRESSION: 1. No emergent vascular finding. 2. Advanced intracranial atherosclerosis with prominent narrowings of ACA and MCA branches. 3. Atherosclerosis in the neck without flow reducing stenosis or ulceration. Electronically Signed   By: Tiburcio Pea M.D.   On: 09/06/2022 06:03   MR BRAIN WO CONTRAST  Result Date: 09/06/2022 CLINICAL DATA:  Mental status change with  falls and stroke suspected EXAM: MRI HEAD WITHOUT CONTRAST TECHNIQUE: Multiplanar, multiecho pulse sequences of the brain and surrounding structures were obtained without intravenous contrast. COMPARISON:  Head CT from yesterday FINDINGS: Brain: Patchy restricted diffusion in the bilateral cerebellum, right occipital cortex, and along the superior right cerebral convexity involving frontal white matter and frontal parietal cortex. The frontoparietal infarcts appear more weakly restricted suggesting subacute timing. Chronic infarct in the right cerebellum with lacunar changes centrally. Subcortical infarct suggested on a chronic basis in the high left frontal white matter. Chronic small vessel ischemic change in the pons. No hemorrhage, mass, or hydrocephalus. Vascular: Normal flow voids. Skull and upper cervical spine: Normal marrow signal. Sinuses/Orbits: Negative. IMPRESSION: 1. Patchy acute infarction in the bilateral cerebellum and right occipital cortex. 2. Patchy subacute appearing infarcts along the high right frontal parietal convexity. Electronically Signed   By: Tiburcio Pea M.D.   On: 09/06/2022 04:17   DG Chest Port 1 View  Result Date: 09/05/2022 CLINICAL DATA:  Weakness, syncopal episode. EXAM: PORTABLE CHEST 1 VIEW COMPARISON:  08/10/2022. FINDINGS: The heart is  enlarged and the mediastinal contour stable. There is atherosclerotic calcification of the aorta. Surgical changes are noted in the left lung. No consolidation, effusion, or pneumothorax. Old rib fractures are noted on the left. Surgical clips are present in the right chest and cervical soft tissues on the left. IMPRESSION: 1. No active disease. 2. Cardiomegaly. Electronically Signed   By: Thornell Sartorius M.D.   On: 09/05/2022 20:15   CT ABDOMEN PELVIS WO CONTRAST  Result Date: 09/05/2022 CLINICAL DATA:  Abdominal pain, stones suspected EXAM: CT ABDOMEN AND PELVIS WITHOUT CONTRAST TECHNIQUE: Multidetector CT imaging of the abdomen and pelvis was performed following the standard protocol without IV contrast. RADIATION DOSE REDUCTION: This exam was performed according to the departmental dose-optimization program which includes automated exposure control, adjustment of the mA and/or kV according to patient size and/or use of iterative reconstruction technique. COMPARISON:  CT chest abdomen pelvis, 08/08/2022 FINDINGS: Lower chest: No acute abnormality. Coronary artery calcifications. Unchanged scarring and or atelectasis of the left lung base. Hepatobiliary: No solid liver abnormality is seen. Gallstones. No gallbladder wall thickening, or biliary dilatation. Pancreas: Unremarkable. No pancreatic ductal dilatation or surrounding inflammatory changes. Spleen: Normal in size without significant abnormality. Adrenals/Urinary Tract: Adrenal glands are unremarkable. Punctuate nonobstructive calculus of the superior pole of the right kidney. No left-sided calculi, ureteral calculi, or hydronephrosis. Dense metallic streak artifact from bilateral hip total arthroplasty significantly limits evaluation of the low pelvis; within this limitation no obvious abnormality of the bladder. Stomach/Bowel: Stomach is within normal limits. Appendix appears normal. No evidence of bowel wall thickening, distention, or inflammatory  changes. Vascular/Lymphatic: Aortic atherosclerosis. No enlarged abdominal or pelvic lymph nodes. Reproductive: Dense metallic streak artifact from bilateral hip total arthroplasty significantly limits evaluation of the low pelvis; within this limitation no obvious abnormality. Other: No abdominal wall hernia or abnormality. No ascites. Musculoskeletal: No acute or significant osseous findings. Status post bilateral hip total arthroplasty. Severe dextroscoliosis of the lumbar spine. IMPRESSION: 1. Punctuate nonobstructive calculus of the superior pole of the right kidney. No left-sided calculi, ureteral calculi, or hydronephrosis. 2. Dense metallic streak artifact from bilateral hip total arthroplasty significantly limits evaluation of the low pelvis; within this limitation no obvious abnormality of the distal ureters or bladder. 3. Cholelithiasis without evidence of acute cholecystitis. 4. Coronary artery disease. Aortic Atherosclerosis (ICD10-I70.0). Electronically Signed   By: Jearld Lesch M.D.   On: 09/05/2022 20:13  CT HEAD WO CONTRAST ( )  Result Date: 09/05/2022 CLINICAL DATA:  Mental status change, unknown cause.  Syncope. EXAM: CT HEAD WITHOUT CONTRAST TECHNIQUE: Contiguous axial images were obtained from the base of the skull through the vertex without intravenous contrast. RADIATION DOSE REDUCTION: This exam was performed according to the departmental dose-optimization program which includes automated exposure control, adjustment of the mA and/or kV according to patient size and/or use of iterative reconstruction technique. COMPARISON:  08/08/2022. FINDINGS: Brain: No acute intracranial hemorrhage, midline shift or mass effect. No extra-axial fluid collection. Diffuse atrophy is noted. Hypodensity is present in the right cerebellar hemisphere which is new from the previous exam periventricular white matter hypodensities are present bilaterally. No hydrocephalus. Vascular: No hyperdense vessel or  unexpected calcification. Skull: Normal. Negative for fracture or focal lesion. Sinuses/Orbits: No acute finding. Other: None. IMPRESSION: 1. No acute intracranial hemorrhage. 2. New hypodensity in the right cerebellar hemisphere, possible acute or subacute infarct. MRI is recommended for further evaluation. 3. Atrophy with chronic microvascular ischemic changes. Electronically Signed   By: Thornell Sartorius M.D.   On: 09/05/2022 20:12    PHYSICAL EXAM General: Alert, well-nourished, well-developed elderly patient in no acute distress Respiratory: Regular, unlabored respirations on room air  NEURO:  Mental Status: AA&Ox3  Speech/Language: speech is without dysarthria or aphasia.  Fluency, and comprehension intact.  Cranial Nerves:  II: PERRL. Visual fields full.  III, IV, VI: EOMI. Eyelids elevate symmetrically.  V: Sensation is intact to light touch and symmetrical to face.  VII: Smile is symmetrical.  VIII: hearing intact to voice. IX, X: Phonation is normal.  [id number] shrug 5/5. XII: tongue is midline without fasciculations. Motor: 5/5 strength to all muscle groups tested.  Tone: is normal and bulk is normal Sensation- Intact to light touch bilaterally.  Coordination: FTN with slight bilateral dysmetria.No drift.  Gait- deferred   ASSESSMENT/PLAN Ms. SHAMBRE PENNYCUFF is a 76 y.o. female with history of hypertension, hyperlipidemia and 1 episode of atrial fibrillation not on anticoagulation presenting with dizziness which has been present for several weeks with an episode of passing out.   She states that she had not been drinking as much as usual, and she may have become dehydrated.  On admission, she was noted to be very hypotensive with systolic blood pressure initially in the 70s.  CT head demonstrated subacute stroke in the right cerebellum.  MRI was performed showing patchy right occipital and bilateral cerebellar infarcts as well as infarcts along high right frontal parietal  convexity.  CTA head and neck demonstrates atherosclerosis of bilateral ACA and MCA branches, making strokes likely the result of hypoperfusion in the setting of hypotension.  Stroke: Bilateral cerebellar infarcts as well as patchy infarcts in right MCA/PCA and MCA/ACA watershed territory Etiology: Hypoperfusion in the setting of intracranial atherosclerosis and hypotension and dehydration CT head new hypodensity in right cerebellar hemisphere CTA head & neck no LVO, advanced intracranial atherosclerosis with narrowing of MCA and PCA branches MRI patchy acute infarcts in bilateral cerebellar hemispheres as well as right occipital cortex and along high right frontal parietal convexity 2D Echo EF 55 to 60%, mild LVH, grade 1 diastolic dysfunction, mildly dilated left atrium, interatrial septum not well visualized Lower extremity ultrasound: No evidence of DVT Consider loop recorder versus 30-day cardiac monitor on discharge given left atrial dilation and history of single episode of A-fib LDL 70 HgbA1c 6.4 VTE prophylaxis -Lovenox aspirin 81 mg daily prior to admission, now on aspirin 81 mg daily and  clopidogrel 75 mg daily for 3 weeks followed by Plavix indefinitely Therapy recommendations: Pending Disposition: Pending  History of hypertension hypotensive on presentation Syncope  Home meds: Losartan 50 mg daily on hold given hypotension on presentation BP 70s with EMS and 90s on arrival to ED Pt admitted lack of appetite recently Stable now On IVF Long-term BP goal normotensive  Hyperlipidemia Home meds: crestor 10 LDL pending, goal < 70 Add rosuvastatin 10 mg daily Continue statin at discharge  Diabetes type II Controlled Home meds: Metformin 500 mg daily HgbA1c 6.4, goal < 7.0 CBGs SSI  Tobacco abuse Current smoker Smoking cessation counseling provided Pt is willing to quit  Other Stroke Risk Factors Advanced Age >/= 59   Other Active Problems Acute kidney  injury-avoid contrast and renally dose medications as appropriate Chronic pain with home opioid use-pain medication per primary team Lone afib per chart  Hospital day # 0  Cortney E Ernestina Columbia , MSN, AGACNP-BC [facility] See Amion for schedule and pager information 09/06/2022 1:32 PM    To contact Stroke Continuity provider, please refer to [email or web address]. After hours, contact General Neurology

## 2022-09-06 NOTE — H&P (Addendum)
History and Physical    Patient: [name] ZOX:[id number] DOB: 1945-12-02 DOA: 09/05/2022 DOS: the patient was seen and examined on 09/06/2022 PCP: Ileana Ladd, MD (Inactive)  Patient coming from: Home  Chief Complaint:  Chief Complaint  Patient presents with   Near Syncope   HPI: [name] is a 76 y.o. female with medical history significant of chronic pain, diabetes, presenting with near syncope and hypotension.  Patient was recently admitted for encephalopathy and was found to have C. difficile.  Also felt to have accidental OD of her high dose narcotics secondary to worsened kidney function (taking her usual dose but ODd due to not excreting narcotics because she had AKI from dehydration)  Patient finished a course of p.o. vancomycin.  Patient states that she has not been feeling well for the last week or so.  She has some confusion and whenever she stands up she feels lightheaded and dizzy.  Patient apparently had a syncopal episode at home.  Her blood pressure was in the 70s at that time.  EMS noticed that she was hypotensive with blood pressure 90/47 and gave her some IV fluids.  Denies overdose on her pain medicine.   Review of Systems: As mentioned in the history of present illness. All other systems reviewed and are negative. Past Medical History:  Diagnosis Date   Asthma    Breast cancer (HCC)    right breast   COPD (chronic obstructive pulmonary disease) (HCC)    Coronary artery disease    Diverticulum of esophagus    Elevated LFTs    Emphysema lung (HCC)    ETOH abuse    H/O atrial fibrillation without current medication    only one time when she had sepsis   Hyperlipidemia    Hypertension    hx of but not on any medications   Myocardial infarction (HCC) 2000   OA (osteoarthritis) of knee    Osteoarthritis    Tobacco abuse    Past Surgical History:  Procedure Laterality Date   ANTERIOR HIP REVISION Right 10/22/2015   Procedure: RIGHT  HIP  REVISION;  Surgeon: Durene Romans, MD;  Location: [facility];  Service: Orthopedics;  Laterality: Right;   APPLICATION OF WOUND VAC N/A 06/20/2015   Procedure: APPLICATION OF INCISIONAL WOUND VAC;  Surgeon: Venita Lick, MD;  Location: [facility] OR;  Service: Orthopedics;  Laterality: N/A;   BREAST SURGERY  1991   right mastectomy   CARDIAC CATHETERIZATION  04/05/2009   EF 60%   CARDIOVASCULAR STRESS TEST  01/31/2005   EF 58%   CESAREAN SECTION  '78, '80, '81   x 3   CORONARY ANGIOPLASTY  08/1998   x2 OF A BIFURCATION OM-1, OM-2 LESION   CORONARY ANGIOPLASTY WITH STENT PLACEMENT  01/1999   MID FIRST OBTUSE MARGINAL VESSEL   CORONARY ANGIOPLASTY WITH STENT PLACEMENT  07/1999   STENTING AT THE CRUX OF THE RIGHT CORONARY ARTERY WITH A 3.8MM X TETRA STENT   DIRECT LARYNGOSCOPY N/A 05/03/2015   Procedure: DIRECT LARYNGOSCOPY;  Surgeon: Flo Shanks, MD;  Location: [facility] OR;  Service: ENT;  Laterality: N/A;   EYE SURGERY  05/18/2014,06/01/2014   BILATERAL CATARACT S WITH LENS IMPLANTS   GASTROSTOMY N/A 05/04/2015   Procedure: OPEN GASTROSTOMY WITH TUBE PLACEMENT;  Surgeon: Manus Rudd, MD;  Location: [facility] OR;  Service: General;  Laterality: N/A;   GASTROSTOMY N/A 11/13/2016   Procedure: INSERTION OF GASTROSTOMY TUBE;  Surgeon: Abigail Miyamoto, MD;  Location: [facility] OR;  Service: General;  Laterality: N/A;   HARDWARE REMOVAL N/A 05/03/2015   Procedure: HARDWARE REMOVAL;  Surgeon: Venita Lick, MD;  Location: [facility];  Service: Orthopedics;  Laterality: N/A;   HIP CLOSED REDUCTION Right 04/26/2015   Procedure: CLOSED REDUCTION HIP;  Surgeon: Venita Lick, MD;  Location: [facility];  Service: Orthopedics;  Laterality: Right;   HYSTEROSCOPY     D & C   INCISION AND DRAINAGE ABSCESS N/A 05/03/2015   Procedure: INCISION AND DRAINAGE CERVICAL  ABSCESS AND REMOVAL OF HARDWARE;  Surgeon: Venita Lick, MD;  Location: [facility];  Service: Orthopedics;  Laterality: N/A;   IR CM INJ ANY COLONIC TUBE W/FLUORO  10/14/2017   IR CM INJ  ANY COLONIC TUBE W/FLUORO  10/23/2017   IR CM INJ ANY COLONIC TUBE W/FLUORO  10/28/2017   IR REPLC GASTRO/COLONIC TUBE PERCUT W/FLUORO  09/22/2017   JOINT REPLACEMENT  08/2011   bilateral hip   JOINT REPLACEMENT  01/2012   right hip   MASTECTOMY     neck fusion  2011   ORIF FEMUR FRACTURE Right 06/14/2019   Procedure: ORIF PERI PROSTHETIC FEMUR FRACTURE;  Surgeon: Durene Romans, MD;  Location: [facility] OR;  Service: Orthopedics;  Laterality: Right;   PELVIC LAPAROSCOPY  2002   RSO-     RADICAL NECK DISSECTION N/A 11/08/2016   Procedure: INCISION AND DRAINAGE OF NECK ABSCESS;  Surgeon: Osborn Coho, MD;  Location: [facility] OR;  Service: ENT;  Laterality: N/A;   RADIOLOGY WITH ANESTHESIA Right 06/28/2015   Procedure: MRI OF CERVICAL SPINE  AND RIGHT HIP  WITH AND WITHOUT CONTRAST    (RADIOLOGY WITH ANESTHESIA);  Surgeon: Medication Radiologist, MD;  Location: [facility];  Service: Radiology;  Laterality: Right;   REMOVAL OF GASTROSTOMY TUBE N/A 11/14/2016   Procedure: REMOVAL OF GASTROSTOMY TUBE W/ REPLACEMENT OF GASTROSTOMY TUBE;  Surgeon: Abigail Miyamoto, MD;  Location: [facility] OR;  Service: General;  Laterality: N/A;   RIGID ESOPHAGOSCOPY N/A 05/03/2015   Procedure: RIGID ESOPHAGOSCOPY;  Surgeon: Flo Shanks, MD;  Location: [facility] OR;  Service: ENT;  Laterality: N/A;   TONSILLECTOMY AND ADENOIDECTOMY     TOTAL HIP ARTHROPLASTY  08/2010   bilat   VULVECTOMY  1981   partial   Social History:  reports that she has been smoking cigarettes. She has been smoking an average of 1 pack per day. She has never used smokeless tobacco. She reports that she does not drink alcohol and does not use drugs.  Allergies  Allergen Reactions   Azithromycin Other (See Comments)   Cefuroxime Other (See Comments)   Cephalexin Other (See Comments)    Took off first layer of skin inside of mouth   Chlorhexidine    Prednisone    Zithromax [Azithromycin Dihydrate] Other (See Comments)    ORAL ULCERS     Family History  Problem  Relation Age of Onset   Diabetes Mother    Hypertension Father    Heart disease Father    Heart attack Father    Stroke Father     Prior to Admission medications   Medication Sig Start Date End Date Taking? Authorizing Provider  albuterol (PROVENTIL HFA;VENTOLIN HFA) 108 (90 BASE) MCG/ACT inhaler Inhale 2 puffs into the lungs every 6 (six) hours as needed for wheezing.     [provider]  ascorbic acid (VITAMIN C) 500 MG tablet Take 500 mg by mouth daily.    [provider]  aspirin EC 81 MG tablet Take 1 tablet (81 mg  total) by mouth daily. 08/12/22   Lewie Chamber, MD  BIOTIN EXTRA STRENGTH PO Take 500 mg by mouth daily.    [provider]  cholecalciferol (VITAMIN D3) 25 MCG (1000 UNIT) tablet Take 1,000 Units by mouth daily.    [provider]  furosemide (LASIX) 20 MG tablet Take 1 tablet (20 mg total) by mouth daily as needed for edema. 12/27/19 08/08/22  [place], Peter M, MD  gabapentin (NEURONTIN) 600 MG tablet Take 600 mg by mouth 4 (four) times daily.     [provider]  losartan (COZAAR) 50 MG tablet Take 1 tablet (50 mg total) by mouth daily. 06/11/22   [place], Peter M, MD  metFORMIN (GLUCOPHAGE-XR) 500 MG 24 hr tablet Take 500 mg by mouth every evening.    [provider]  morphine (MS CONTIN) 30 MG 12 hr tablet Take 30 mg by mouth 4 (four) times daily.  12/23/16   [provider]  Multiple Vitamin (MULTIVITAMIN) capsule Take 1 capsule by mouth daily. Centrum silver    [provider]  oxyCODONE (ROXICODONE) 15 MG immediate release tablet Take 15 mg by mouth 4 (four) times daily.    [provider]  rosuvastatin (CRESTOR) 10 MG tablet Take 1 tablet (10 mg total) by mouth daily. Please keep scheduled appointment 06/25/22   [place], Peter M, MD    Physical Exam: Vitals:   09/05/22 2206 09/05/22 2228 09/06/22 0112 09/06/22 0421  BP: 136/88  127/80 (!) 107/47  Pulse: 86  88 81  Resp: 18  18 17    Temp: 97.7 F (36.5 C) 97.8 F (36.6 C)    TempSrc: Oral Oral    SpO2: 92%  94% 97%  Weight:      Height:       Constitutional: NAD, calm, comfortable Eyes: PERRL, lids and conjunctivae normal ENMT: Mucous membranes are dry. Posterior pharynx clear of any exudate or lesions.Normal dentition.  Neck: normal, supple, no masses, no thyromegaly Respiratory: clear to auscultation bilaterally, no wheezing, no crackles. Normal respiratory effort. No accessory muscle use.  Cardiovascular: Regular rate and rhythm, no murmurs / rubs / gallops. No extremity edema. 2+ pedal pulses. No carotid bruits.  Abdomen: no tenderness, no masses palpated. No hepatosplenomegaly. Bowel sounds positive.  Musculoskeletal: no clubbing / cyanosis. No joint deformity upper and lower extremities. Good ROM, no contractures. Normal muscle tone.  Skin: no rashes, lesions, ulcers. No induration Neurologic: Pt with some dysmetria on finger to nose testing bilaterally, Gait not tested Psychiatric: Normal judgment and insight. Alert and oriented x 3. Normal mood. Not really seeing much confusion at time of my exam.  Data Reviewed:       Latest Ref Rng & Units 09/05/2022    7:05 PM 08/12/2022    4:12 AM 08/11/2022    3:35 AM  CBC  WBC 4.0 - 10.5 K/uL 13.1  9.5  9.7   Hemoglobin 12.0 - 15.0 g/dL 10.2  72.5  36.6   Hematocrit 36.0 - 46.0 % 44.4  37.2  36.5   Platelets 150 - 400 K/uL 185  181  164       Latest Ref Rng & Units 09/05/2022    7:05 PM 08/12/2022    4:12 AM 08/11/2022    3:35 AM  CMP  Glucose 70 - 99 mg/dL 440  347  425   BUN 8 - 23 mg/dL 32  18  26   Creatinine 0.44 - 1.00 mg/dL 9.56  3.87  5.64  Sodium 135 - 145 mmol/L 137  137  136   Potassium 3.5 - 5.1 mmol/L 3.6  3.5  3.1   Chloride 98 - 111 mmol/L 98  105  107   CO2 22 - 32 mmol/L 26  22  20    Calcium 8.9 - 10.3 mg/dL 9.1  8.2  8.2   Total Protein 6.5 - 8.1 g/dL 6.9     Total Bilirubin 0.3 - 1.2 mg/dL 0.8     Alkaline Phos 38 - 126 U/L 71      AST 15 - 41 U/L 28     ALT 0 - 44 U/L 21      MRI brain: IMPRESSION: 1. Patchy acute infarction in the bilateral cerebellum and right occipital cortex. 2. Patchy subacute appearing infarcts along the high right frontal parietal convexity.    Assessment and Plan: * Acute ischemic stroke (HCC) Posterior arterial system embolic event vs basilar artery LVO. Hypotension also possible but Dr. Amada Jupiter notes that cerebellum is usually very resistant to hypotension / watershed, so less likely he feels. Stroke pathway Getting CTA head and neck now to rule out LVO / basilar artery LVO. Has mild AKI with creat 1.3 but agree that we can't risk missing a basilar artery LVO in setting of multifocal posterior acute ischemic strokes. 2d echo Tele monitor PT/OT/SLP ASA 81 daily Plavix 75 for 21 days A1C, FLP  Chronic pain Currently on extremely high doses of opiates at home including Oxy 15 Q6H, MS Contin 30mg  scheduled Q6H and gabapentin 600mg  Q6H. Just admitted to hospital at end of Nov with accidental OD secondary to AKI causing decreased elimination most likely. Discharging team felt strongly that dosing needed to be weaned down some.  Creat today 1.3 up from baseline 0.4, though not as bad as the 2.6 with last admit. Oxy IR 5-10mg  Q6H PRN Hold MS Contin Hold gabapentin for the moment  AKI (acute kidney injury) (HCC) Suspect secondary to reported hypotension this past week (pre-renal). Recovery may be delayed as we are getting CTA head and neck on her now, but agree with neurology that risks / benefits favor getting CTA at this point. IVF Hold home BP meds Hold losartan Daily BMP Strict intake and output  Type 2 diabetes mellitus with hyperglycemia (HCC) Sensitive SSI AC/HS Hold home metformin  Essential hypertension Hold home BP meds and allow permissive HTN in setting of acute ischemic strokes.  Hyperlipidemia Lipid profile pending Cont crestor for now, presumably dose  will need to be increased though depending on what lipid profile shows.      Advance Care Planning:   Code Status: Full Code  Consults: Dr. Amada Jupiter at bedside  Family Communication: No family in room  Severity of Illness: The appropriate patient status for this patient is INPATIENT. Inpatient status is judged to be reasonable and necessary in order to provide the required intensity of service to ensure the patient's safety. The patient's presenting symptoms, physical exam findings, and initial radiographic and laboratory data in the context of their chronic comorbidities is felt to place them at high risk for further clinical deterioration. Furthermore, it is not anticipated that the patient will be medically stable for discharge from the hospital within 2 midnights of admission.   * I certify that at the point of admission it is my clinical judgment that the patient will require inpatient hospital care spanning beyond 2 midnights from the point of admission due to high intensity of service, high risk for further deterioration  and high frequency of surveillance required.*  Author: Hillary Bow., DO 09/06/2022 5:23 AM  For on call review [email or web address].

## 2022-09-06 NOTE — ED Provider Notes (Signed)
Pt transferred for MRI.  She is to have AKI likely secondary to dehydration, initially had hypotension but this resolved after IV fluid hydration.  Care assumed pending MRI to evaluate for CVA given her dizziness on standing.  MRI is significant for multiple infarcts.  On examination she is drowsy, but awoken from sleep after receiving sedating medications.  Discussed with Dr. Leonel Ramsay with neurology, will see the patient in consult.  Medicine consulted for admission.  Patient updated of findings of studies and recommendation for admission and she is in agreement with plan.   Quintella Reichert, MD 09/06/22 [phone]

## 2022-09-06 NOTE — ED Notes (Signed)
Husband Kobe Jansma [phone] would like an update asap and would like information as to why she's being admitted

## 2022-09-06 NOTE — Assessment & Plan Note (Addendum)
Posterior arterial system embolic event vs basilar artery LVO. Hypotension also possible but Dr. Leonel Ramsay notes that cerebellum is usually very resistant to hypotension / watershed, so less likely he feels. Stroke pathway Getting CTA head and neck now to rule out LVO / basilar artery LVO. Has mild AKI with creat 1.3 but agree that we can't risk missing a basilar artery LVO in setting of multifocal posterior acute ischemic strokes. 2d echo Tele monitor PT/OT/SLP ASA 81 daily Plavix 75 for 21 days A1C, FLP

## 2022-09-06 NOTE — ED Notes (Signed)
Pt ambulatory to the bathroom with a steady gait.

## 2022-09-06 NOTE — Assessment & Plan Note (Signed)
Sensitive SSI AC/HS Hold home metformin

## 2022-09-06 NOTE — Progress Notes (Addendum)
This is a very pleasant and functioning 76 year old lady with history of chronic pain, diabetes who actually presented to the ED with near syncope episode as well as hypotension and was eventually diagnosed with cerebellar stroke.  Neurology was consulted.  She was on aspirin, Plavix has been added.  She is on a statin.  CTA head and neck ruled out large vessel obstruction.  Patient seen and examined the ED, she is fully alert and oriented and she has no other complaint, no weakness, no focal deficit on exam.  She denied any dizziness.  When discussed about further evaluation by PT, she says I do not think I need to be seen by PT.  Await further recommendations from neurology.  Total time spent 15 minutes.  She also appears to have mild AKI, she is on intersected which I will continue.

## 2022-09-06 NOTE — Assessment & Plan Note (Addendum)
Suspect secondary to reported hypotension this past week (pre-renal). Recovery may be delayed as we are getting CTA head and neck on her now, but agree with neurology that risks / benefits favor getting CTA at this point. IVF Hold home BP meds Hold losartan Daily BMP Strict intake and output

## 2022-09-06 NOTE — Consult Note (Addendum)
Neurology Consultation Reason for Consult: Stroke Referring Physician: Sheran Luz  CC: Stroke  History is obtained from: Patient  HPI: [name] is a 76 y.o. female with a history of hypertension, hyperlipidemia, single previous episode of atrial fibrillation who complains of dizziness that has been fairly severe for the past couple of weeks.  She estimates that she has passed out about 10 times.  On presentation, she has been severely hypotensive.  She presented today because of an episode of syncope and was very dizzy.  Due to this a CT scan was obtained which demonstrated subacute strokes and therefore she was transferred to [facility].  MRI confirmed multiple posterior circulation infarcts including the cerebellum and occipital lobe as well as at least a couple in nonposterior circulation subcortical white matter.   LKW: Unclear, dizzy for 2 weeks but suspect strokes more recent tpa given?: no, out of window  Past Medical History:  Diagnosis Date   Asthma    Breast cancer ([place])    right breast   COPD (chronic obstructive pulmonary disease) (HCC)    Coronary artery disease    Diverticulum of esophagus    Elevated LFTs    Emphysema lung (HCC)    ETOH abuse    H/O atrial fibrillation without current medication    only one time when she had sepsis   Hyperlipidemia    Hypertension    hx of but not on any medications   Myocardial infarction ([place]) 2000   OA (osteoarthritis) of knee    Osteoarthritis    Tobacco abuse      Family History  Problem Relation Age of Onset   Diabetes Mother    Hypertension Father    Heart disease Father    Heart attack Father    Stroke Father      Social History:  reports that she has been smoking cigarettes. She has been smoking an average of 1 pack per day. She has never used smokeless tobacco. She reports that she does not drink alcohol and does not use drugs.   Exam: Current vital signs: BP (!) 107/47   Pulse 81   Temp  97.8 F (36.6 C) (Oral)   Resp 17   Ht '5\' 7"'$  (1.702 m)   Wt 54.4 kg   SpO2 97%   BMI 18.79 kg/m  Vital signs in last 24 hours: Temp:  [97.5 F (36.4 C)-97.8 F (36.6 C)] 97.8 F (36.6 C) (12/22 2228) Pulse Rate:  [78-88] 81 (12/23 0421) Resp:  [14-18] 17 (12/23 0421) BP: (107-136)/(47-88) 107/47 (12/23 0421) SpO2:  [92 %-97 %] 97 % (12/23 0421) Weight:  [54.4 kg] 54.4 kg (12/22 1848)   Physical Exam  Constitutional: Appears well-developed and well-nourished.  Psych: Affect appropriate to situation Eyes: No scleral injection HENT: No OP obstruction MSK: no joint deformities.  Cardiovascular: Normal rate and regular rhythm.  Respiratory: Effort normal, non-labored breathing GI: Soft.  No distension. There is no tenderness.  Skin: WDI  Neuro: Mental Status: Patient is awake, alert, oriented to person, place, month, year, and situation. Patient is able to give a clear and coherent history. No signs of aphasia or neglect Cranial Nerves: II: Visual Fields are full. Pupils are equal, round, and reactive to light.   III,IV, VI: EOMI without ptosis or diploplia.  V: Facial sensation is symmetric to temperature VII: Facial movement is symmetric.  VIII: hearing is intact to voice X: Uvula elevates symmetrically XI: Shoulder shrug is symmetric. XII: tongue is  midline without atrophy or fasciculations.  Motor: Tone is normal. Bulk is normal. 5/5 strength was present in bilateral arms, I question very mild left leg weakness Sensory: Sensation is symmetric to light touch and temperature in the arms and legs. Cerebellar: She has mild ataxia on the left arm greater than the right arm but present in both.     I have reviewed labs in epic and the results pertinent to this consultation are: Mildly elevated creatinine at 1.3  I have reviewed the images obtained: MRI brain-multifocal strokes in the posterior circulation  Impression: 76 year old female with multiple posterior  circulation infarcts.  Though the infarcts not clearly in the posterior circulation could be watershed in nature, the posterior circulation infarcts including the cerebellum are not in typical areas for watershed and I would favor embolism.  Certainly her severe hypertension is concerning as well, and the dizziness that she complains of actually sounds more like hypoperfusion than vertiginous symptoms associated with her cerebellar strokes.  She will need to be admitted for further evaluation and secondary risk factor modification.  Recommendations: - HgbA1c, fasting lipid panel - MRI of the brain without contrast - Frequent neuro checks - Echocardiogram - CTA head and neck - Prophylactic therapy-Antiplatelet med: Aspirin - dose '81mg'$  and Plavix 75 mg daily - Telemetry monitoring - PT consult, OT consult, Speech consult - Stroke team to follow  Roland Rack, MD [facility] [phone]  If 7pm- 7am, please page neurology on call as listed in [place].

## 2022-09-06 NOTE — Assessment & Plan Note (Signed)
Hold home BP meds and allow permissive HTN in setting of acute ischemic strokes.

## 2022-09-06 NOTE — Progress Notes (Incomplete)
Echocardiogram 2D Echocardiogram has been performed.  Ronny Flurry 09/06/2022, 12:38 PM

## 2022-09-06 NOTE — Progress Notes (Deleted)
OT Screen Note  Patient Details Name: [name] MRN: [id number] DOB: 29-Nov-1945   Cancelled Treatment:    Reason Eval/Treat Not Completed: OT screened, no needs identified, will sign off Spoke with PT Logan no acute OT needs  Jeri Modena 09/06/2022, 1:12 PM

## 2022-09-06 NOTE — Assessment & Plan Note (Signed)
Currently on extremely high doses of opiates at home including Oxy 15 Q6H, MS Contin '30mg'$  scheduled Q6H and gabapentin '600mg'$  Q6H. Just admitted to hospital at end of Nov with accidental OD secondary to AKI causing decreased elimination most likely. Discharging team felt strongly that dosing needed to be weaned down some.  Creat today 1.3 up from baseline 0.4, though not as bad as the 2.6 with last admit. Oxy IR 5-'10mg'$  Q6H PRN Hold MS Contin Hold gabapentin for the moment

## 2022-09-06 NOTE — ED Notes (Signed)
Pt walking around nurses station, redirected back to stretcher. NAD noted at this time

## 2022-09-06 NOTE — ED Notes (Signed)
Pt arrived to bed H21

## 2022-09-06 NOTE — Assessment & Plan Note (Addendum)
Lipid profile pending Cont crestor for now, presumably dose will need to be increased though depending on what lipid profile shows.

## 2022-09-07 DIAGNOSIS — I639 Cerebral infarction, unspecified: Secondary | ICD-10-CM | POA: Diagnosis not present

## 2022-09-07 DIAGNOSIS — N179 Acute kidney failure, unspecified: Secondary | ICD-10-CM | POA: Diagnosis not present

## 2022-09-07 LAB — CBC WITH DIFFERENTIAL/PLATELET
Abs Immature Granulocytes: 0.03 10*3/uL (ref 0.00–0.07)
Basophils Absolute: 0 10*3/uL (ref 0.0–0.1)
Basophils Relative: 1 %
Eosinophils Absolute: 0.2 10*3/uL (ref 0.0–0.5)
Eosinophils Relative: 2 %
HCT: 36.4 % (ref 36.0–46.0)
Hemoglobin: 12.8 g/dL (ref 12.0–15.0)
Immature Granulocytes: 0 %
Lymphocytes Relative: 26 %
Lymphs Abs: 2.2 10*3/uL (ref 0.7–4.0)
MCH: 31.2 pg (ref 26.0–34.0)
MCHC: 35.2 g/dL (ref 30.0–36.0)
MCV: 88.8 fL (ref 80.0–100.0)
Monocytes Absolute: 0.6 10*3/uL (ref 0.1–1.0)
Monocytes Relative: 8 %
Neutro Abs: 5.3 10*3/uL (ref 1.7–7.7)
Neutrophils Relative %: 63 %
Platelets: 156 10*3/uL (ref 150–400)
RBC: 4.1 MIL/uL (ref 3.87–5.11)
RDW: 13.7 % (ref 11.5–15.5)
WBC: 8.3 10*3/uL (ref 4.0–10.5)
nRBC: 0 % (ref 0.0–0.2)

## 2022-09-07 LAB — LIPID PANEL
Cholesterol: 112 mg/dL (ref 0–200)
HDL: 38 mg/dL — ABNORMAL LOW (ref >40)
LDL Cholesterol: 60 mg/dL (ref 0–99)
Total CHOL/HDL Ratio: 2.9 RATIO
Triglycerides: 68 mg/dL (ref <150)
VLDL: 14 mg/dL (ref 0–40)

## 2022-09-07 LAB — BASIC METABOLIC PANEL
Anion gap: 7 (ref 5–15)
BUN: 20 mg/dL (ref 8–23)
CO2: 25 mmol/L (ref 22–32)
Calcium: 8.5 mg/dL — ABNORMAL LOW (ref 8.9–10.3)
Chloride: 105 mmol/L (ref 98–111)
Creatinine, Ser: 0.76 mg/dL (ref 0.44–1.00)
GFR, Estimated: >60 mL/min (ref >60)
Glucose, Bld: 113 mg/dL — ABNORMAL HIGH (ref 70–99)
Potassium: 3.1 mmol/L — ABNORMAL LOW (ref 3.5–5.1)
Sodium: 137 mmol/L (ref 135–145)

## 2022-09-07 LAB — GLUCOSE, CAPILLARY
Glucose-Capillary: 111 mg/dL — ABNORMAL HIGH (ref 70–99)
Glucose-Capillary: 105 mg/dL — ABNORMAL HIGH (ref 70–99)

## 2022-09-07 LAB — MAGNESIUM: Magnesium: 2 mg/dL (ref 1.7–2.4)

## 2022-09-07 MED ORDER — POTASSIUM CHLORIDE CRYS ER 20 MEQ PO TBCR
40.0000 meq | EXTENDED_RELEASE_TABLET | ORAL | Status: AC
  Administered 2022-09-07 (×2): 40 meq via ORAL
  Filled 2022-09-07 (×2): qty 2

## 2022-09-07 MED ORDER — POTASSIUM CHLORIDE CRYS ER 20 MEQ PO TBCR
40.0000 meq | EXTENDED_RELEASE_TABLET | Freq: Once | ORAL | Status: AC
  Administered 2022-09-07: 40 meq via ORAL
  Filled 2022-09-07: qty 2

## 2022-09-07 MED ORDER — ASPIRIN 81 MG PO TBEC: 81.0000 mg | DELAYED_RELEASE_TABLET | Freq: Every day | ORAL | 0 refills | Status: AC

## 2022-09-07 MED ORDER — CLOPIDOGREL BISULFATE 75 MG PO TABS: 75.0000 mg | ORAL_TABLET | Freq: Every day | ORAL | 1 refills | Status: AC

## 2022-09-07 NOTE — Progress Notes (Signed)
Pt discharged to home via personal vehicle

## 2022-09-07 NOTE — Plan of Care (Signed)
  Problem: Education: Goal: Ability to describe self-care measures that may prevent or decrease complications (Diabetes Survival Skills Education) will improve Outcome: Adequate for Discharge Goal: Individualized Educational Video(s) Outcome: Adequate for Discharge

## 2022-09-07 NOTE — Progress Notes (Addendum)
STROKE TEAM PROGRESS NOTE   INTERVAL HISTORY Patient is seen in her room with no family at the bedside.  She is eager to go home today.  Her vital signs have been stable, and her neurological exam is stable with only slight left facial droop seen.  Vitals:   09/07/22 0425 09/07/22 0557 09/07/22 0756 09/07/22 1142  BP: (!) 141/70 (!) 148/76 (!) 150/71 123/65  Pulse: 67 60 60 (!) 58  Resp: '16 15 17 18  '$ Temp: 97.8 F (36.6 C)  98.6 F (37 C) 98.7 F (37.1 C)  TempSrc: Axillary  Oral Oral  SpO2: 93% 95% 97% 98%  Weight:  56.4 kg    Height:       CBC:  Recent Labs  Lab 09/05/22 1905 09/07/22 0249  WBC 13.1* 8.3  NEUTROABS 9.8* 5.3  HGB 14.8 12.8  HCT 44.4 36.4  MCV 92.1 88.8  PLT 185 527    Basic Metabolic Panel:  Recent Labs  Lab 09/05/22 1905 09/07/22 0249  NA 137 137  K 3.6 3.1*  CL 98 105  CO2 26 25  GLUCOSE 179* 113*  BUN 32* 20  CREATININE 1.30* 0.76  CALCIUM 9.1 8.5*  MG  --  2.0    Lipid Panel:  Recent Labs  Lab 09/07/22 0249  CHOL 112  TRIG 68  HDL 38*  CHOLHDL 2.9  VLDL 14  LDLCALC 60   HgbA1c: No results for input(s): "HGBA1C" in the last 168 hours. Urine Drug Screen:  Recent Labs  Lab 09/05/22 2209  LABOPIA POSITIVE*  COCAINSCRNUR NONE DETECTED  LABBENZ NONE DETECTED  AMPHETMU NONE DETECTED  THCU NONE DETECTED  LABBARB NONE DETECTED     Alcohol Level No results for input(s): "ETH" in the last 168 hours.  IMAGING past 24 hours No results found.  PHYSICAL EXAM General: Alert, well-nourished, well-developed elderly patient in no acute distress Respiratory: Regular, unlabored respirations on room air  NEURO:  Mental Status: AA&Ox3  Speech/Language: speech is without dysarthria or aphasia.  Fluency, and comprehension intact.  Cranial Nerves:  II: PERRL. Visual fields full.  III, IV, VI: EOMI. Eyelids elevate symmetrically.  V: Sensation is intact to light touch and symmetrical to face.  VII: Very slight left facial  droop VIII: hearing intact to voice. IX, X: Phonation is normal.  PO:EUMPNTIR shrug 5/5. XII: tongue is midline without fasciculations. Motor: 5/5 strength to all muscle groups tested.  Tone: is normal and bulk is normal Sensation- Intact to light touch bilaterally.  Coordination: FTN with no dysmetria.No drift.  Gait- deferred   ASSESSMENT/PLAN [name] is a 76 y.o. female with history of hypertension, hyperlipidemia and 1 episode of atrial fibrillation not on anticoagulation presenting with dizziness which has been present for several weeks with an episode of passing out.   She states that she had not been drinking as much as usual, and she may have become dehydrated.  On admission, she was noted to be very hypotensive with systolic blood pressure initially in the 70s.  CT head demonstrated subacute stroke in the right cerebellum.  MRI was performed showing patchy right occipital and bilateral cerebellar infarcts as well as infarcts along high right frontal parietal convexity.  CTA head and neck demonstrates atherosclerosis of bilateral ACA and MCA branches, making strokes likely the result of hypoperfusion in the setting of hypotension.  Stroke: Bilateral cerebellar infarcts as well as patchy infarcts in right MCA/PCA and MCA/ACA watershed territory Etiology: Hypoperfusion in the setting of intracranial  atherosclerosis and hypotension and dehydration CT head new hypodensity in right cerebellar hemisphere CTA head & neck no LVO, advanced intracranial atherosclerosis with narrowing of MCA and PCA branches MRI patchy acute infarcts in bilateral cerebellar hemispheres as well as right occipital cortex and along high right frontal parietal convexity 2D Echo EF 55 to 60%, mildly dilated left atrium Lower extremity ultrasound: No evidence of DVT Consider 30-day cardiac monitor on discharge given left atrial dilation and history of single episode of A-fib LDL 70 HgbA1c 6.4 VTE  prophylaxis -Lovenox aspirin 81 mg daily prior to admission, now on aspirin 81 mg daily and clopidogrel 75 mg daily for 3 weeks followed by Plavix indefinitely Therapy recommendations: Pending Disposition: Pending  History of hypertension hypotensive on presentation Syncope  Home meds: Losartan 50 mg daily on hold given hypotension on presentation BP 70s with EMS and 90s on arrival to ED Pt admitted lack of appetite recently Stable now On IVF Long-term BP goal normotensive  Hyperlipidemia Home meds: crestor 10 LDL 60, goal < 70 resume rosuvastatin 10 mg daily Continue statin at discharge  Diabetes type II Controlled Home meds: Metformin 500 mg daily HgbA1c 6.4, goal < 7.0 CBGs SSI  Tobacco abuse Current smoker Smoking cessation counseling provided Pt is willing to quit  Other Stroke Risk Factors Advanced Age >/= 58   Other Active Problems Acute kidney injury-avoid contrast and renally dose medications as appropriate Chronic pain with home opioid use-pain medication per primary team Lone afib per chart  Hospital day # [place] , MSN, AGACNP-BC [facility] See Amion for schedule and pager information 09/07/2022 1:15 PM  ATTENDING NOTE: I reviewed above note and agree with the assessment and plan. No acute event overnight. Medically stable for discharge. Recommend 30 day cardiac event monitoring as outpt. Continue DAPT for 3 weeks and statin, follow up at [facility].  For detailed assessment and plan, please refer to above/below as I have made changes wherever appropriate.   Rosalin Hawking, MD PhD Stroke Neurology 09/07/2022 2:02 PM     To contact Stroke Continuity provider, please refer to [email or web address] After hours, contact General Neurology

## 2022-09-07 NOTE — Discharge Summary (Signed)
Physician Discharge Summary  [name] ZOX:[id number] DOB: 1946-05-11 DOA: 09/05/2022  PCP: Vernie Shanks, MD (Inactive)  Admit date: 09/05/2022 Discharge date: 09/07/2022 30 Day Unplanned Readmission Risk Score    Flowsheet Row ED to Hosp-Admission (Current) from 09/05/2022 in [facility]  30 Day Unplanned Readmission Risk Score (%) 19.97 Filed at 09/07/2022 1200       This score is the patient's risk of an unplanned readmission within 30 days of being discharged (0 -100%). The score is based on dignosis, age, lab data, medications, orders, and past utilization.   Low:  0-14.9   Medium: 15-21.9   High: 22-29.9   Extreme: 30 and above          Admitted From: Home Disposition: Home  Recommendations for Outpatient Follow-up:  Follow up with PCP in 1-2 weeks Please obtain BMP/CBC in one week Follow-up with neurology in 4 weeks Please continue to take both aspirin and Plavix for 3 weeks and then stop taking aspirin but continue taking Plavix afterwards. Please follow up with your PCP on the following pending results: Unresulted Labs (From admission, onward)     Start     Ordered   09/07/22 0614  Magnesium  Add-on,   AD       Question:  Specimen collection method  Answer:  Lab=Lab collect   09/07/22 0613   09/07/22 8119  Basic metabolic panel  Daily,   R      09/06/22 0526   09/07/22 0500  Hemoglobin A1c  Once,   R        09/07/22 0500              Home Health: None-patient declined Equipment/Devices: Rollator, she already has that  Discharge Condition: Stable CODE STATUS: Full code Diet recommendation: Cardiac  Subjective: Patient seen and examined.  She has no complaints.  She is eager to go home.  Brief/Interim Summary: [name] is a 76 y.o. female with medical history significant of chronic pain, diabetes, presented with near syncope and hypotension.   Patient was recently admitted for encephalopathy and was found to have C.  difficile.  Also felt to have accidental OD of her high dose narcotics secondary to worsened kidney function (taking her usual dose but ODd due to not excreting narcotics because she had AKI from dehydration). Patient finished a course of p.o. vancomycin.   Patient states that she has not been feeling well for the last week or so.  She has some confusion and whenever she stands up she feels lightheaded and dizzy. Patient apparently had a syncopal episode at home.  Her blood pressure was in the 70s at that time.  EMS noticed that she was hypotensive with blood pressure 90/47 and gave her some IV fluids.  Denied overdose on her pain medicine.  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  Patient was on aspirin, she was started on Plavix.  Crestor was continued.  Neurology saw the patient.  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  Her losartan was held during this hospitalization and blood pressure still remained low so based on that, I am discontinuing her losartan.  Patient was seen by PT, she is deemed to be at her baseline, they although recommended home health but patient declined that.  She is cleared from PT as well as neurology.  However neurology has recommended 30-day monitor  Physician Discharge Summary  [name] ZOX:[id number] DOB: 1946-05-11 DOA: 09/05/2022  PCP: Vernie Shanks, MD (Inactive)  Admit date: 09/05/2022 Discharge date: 09/07/2022 30 Day Unplanned Readmission Risk Score    Flowsheet Row ED to Hosp-Admission (Current) from 09/05/2022 in [facility]  30 Day Unplanned Readmission Risk Score (%) 19.97 Filed at 09/07/2022 1200       This score is the patient's risk of an unplanned readmission within 30 days of being discharged (0 -100%). The score is based on dignosis, age, lab data, medications, orders, and past utilization.   Low:  0-14.9   Medium: 15-21.9   High: 22-29.9   Extreme: 30 and above          Admitted From: Home Disposition: Home  Recommendations for Outpatient Follow-up:  Follow up with PCP in 1-2 weeks Please obtain BMP/CBC in one week Follow-up with neurology in 4 weeks Please continue to take both aspirin and Plavix for 3 weeks and then stop taking aspirin but continue taking Plavix afterwards. Please follow up with your PCP on the following pending results: Unresulted Labs (From admission, onward)     Start     Ordered   09/07/22 0614  Magnesium  Add-on,   AD       Question:  Specimen collection method  Answer:  Lab=Lab collect   09/07/22 0613   09/07/22 8119  Basic metabolic panel  Daily,   R      09/06/22 0526   09/07/22 0500  Hemoglobin A1c  Once,   R        09/07/22 0500              Home Health: None-patient declined Equipment/Devices: Rollator, she already has that  Discharge Condition: Stable CODE STATUS: Full code Diet recommendation: Cardiac  Subjective: Patient seen and examined.  She has no complaints.  She is eager to go home.  Brief/Interim Summary: [name] is a 76 y.o. female with medical history significant of chronic pain, diabetes, presented with near syncope and hypotension.   Patient was recently admitted for encephalopathy and was found to have C.  difficile.  Also felt to have accidental OD of her high dose narcotics secondary to worsened kidney function (taking her usual dose but ODd due to not excreting narcotics because she had AKI from dehydration). Patient finished a course of p.o. vancomycin.   Patient states that she has not been feeling well for the last week or so.  She has some confusion and whenever she stands up she feels lightheaded and dizzy. Patient apparently had a syncopal episode at home.  Her blood pressure was in the 70s at that time.  EMS noticed that she was hypotensive with blood pressure 90/47 and gave her some IV fluids.  Denied overdose on her pain medicine.  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  Patient was on aspirin, she was started on Plavix.  Crestor was continued.  Neurology saw the patient.  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  Her losartan was held during this hospitalization and blood pressure still remained low so based on that, I am discontinuing her losartan.  Patient was seen by PT, she is deemed to be at her baseline, they although recommended home health but patient declined that.  She is cleared from PT as well as neurology.  However neurology has recommended 30-day monitor  Neurology   Procedures/Studies: VAS [place] LOWER EXTREMITY VENOUS (DVT)  Result Date: 09/06/2022  Lower Venous DVT Study Patient Name:  [name]  Date of Exam:   09/06/2022 Medical Rec #: [id number]       Accession #:    [id number] Date of Birth: May 05, 1946       Patient Gender: F Patient Age:   76 years Exam Location:  [facility] Procedure:      VAS [place] LOWER EXTREMITY VENOUS (DVT) Referring Phys: Cornelius Moras XU --------------------------------------------------------------------------------  Indications: Stroke, and Chronic bilateral lower extremity swelling.  Risk Factors: Hip surgery X 6. Comparison Study: No prior study Performing Technologist: Sharion Dove RVS  Examination Guidelines: A complete evaluation includes B-mode imaging, spectral Doppler, color Doppler, and power Doppler as needed of all accessible portions of each vessel. Bilateral testing is considered an integral part of a complete examination. Limited examinations for reoccurring indications may be performed as noted. The reflux portion of the exam is performed with the patient in reverse Trendelenburg.  +---------+---------------+---------+-----------+----------+--------------+ RIGHT    CompressibilityPhasicitySpontaneityPropertiesThrombus Aging +---------+---------------+---------+-----------+----------+--------------+ CFV      Full           Yes      Yes                                  +---------+---------------+---------+-----------+----------+--------------+ SFJ      Full                                                        +---------+---------------+---------+-----------+----------+--------------+ FV Prox  Full                                                        +---------+---------------+---------+-----------+----------+--------------+ FV Mid   Full                                                        +---------+---------------+---------+-----------+----------+--------------+ FV DistalFull                                                        +---------+---------------+---------+-----------+----------+--------------+ PFV      Full                                                        +---------+---------------+---------+-----------+----------+--------------+ POP      Full           Yes      Yes                                 +---------+---------------+---------+-----------+----------+--------------+  Physician Discharge Summary  [name] ZOX:[id number] DOB: 1946-05-11 DOA: 09/05/2022  PCP: Vernie Shanks, MD (Inactive)  Admit date: 09/05/2022 Discharge date: 09/07/2022 30 Day Unplanned Readmission Risk Score    Flowsheet Row ED to Hosp-Admission (Current) from 09/05/2022 in [facility]  30 Day Unplanned Readmission Risk Score (%) 19.97 Filed at 09/07/2022 1200       This score is the patient's risk of an unplanned readmission within 30 days of being discharged (0 -100%). The score is based on dignosis, age, lab data, medications, orders, and past utilization.   Low:  0-14.9   Medium: 15-21.9   High: 22-29.9   Extreme: 30 and above          Admitted From: Home Disposition: Home  Recommendations for Outpatient Follow-up:  Follow up with PCP in 1-2 weeks Please obtain BMP/CBC in one week Follow-up with neurology in 4 weeks Please continue to take both aspirin and Plavix for 3 weeks and then stop taking aspirin but continue taking Plavix afterwards. Please follow up with your PCP on the following pending results: Unresulted Labs (From admission, onward)     Start     Ordered   09/07/22 0614  Magnesium  Add-on,   AD       Question:  Specimen collection method  Answer:  Lab=Lab collect   09/07/22 0613   09/07/22 8119  Basic metabolic panel  Daily,   R      09/06/22 0526   09/07/22 0500  Hemoglobin A1c  Once,   R        09/07/22 0500              Home Health: None-patient declined Equipment/Devices: Rollator, she already has that  Discharge Condition: Stable CODE STATUS: Full code Diet recommendation: Cardiac  Subjective: Patient seen and examined.  She has no complaints.  She is eager to go home.  Brief/Interim Summary: [name] is a 76 y.o. female with medical history significant of chronic pain, diabetes, presented with near syncope and hypotension.   Patient was recently admitted for encephalopathy and was found to have C.  difficile.  Also felt to have accidental OD of her high dose narcotics secondary to worsened kidney function (taking her usual dose but ODd due to not excreting narcotics because she had AKI from dehydration). Patient finished a course of p.o. vancomycin.   Patient states that she has not been feeling well for the last week or so.  She has some confusion and whenever she stands up she feels lightheaded and dizzy. Patient apparently had a syncopal episode at home.  Her blood pressure was in the 70s at that time.  EMS noticed that she was hypotensive with blood pressure 90/47 and gave her some IV fluids.  Denied overdose on her pain medicine.  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  Patient was on aspirin, she was started on Plavix.  Crestor was continued.  Neurology saw the patient.  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  Her losartan was held during this hospitalization and blood pressure still remained low so based on that, I am discontinuing her losartan.  Patient was seen by PT, she is deemed to be at her baseline, they although recommended home health but patient declined that.  She is cleared from PT as well as neurology.  However neurology has recommended 30-day monitor  Physician Discharge Summary  [name] ZOX:[id number] DOB: 1946-05-11 DOA: 09/05/2022  PCP: Vernie Shanks, MD (Inactive)  Admit date: 09/05/2022 Discharge date: 09/07/2022 30 Day Unplanned Readmission Risk Score    Flowsheet Row ED to Hosp-Admission (Current) from 09/05/2022 in [facility]  30 Day Unplanned Readmission Risk Score (%) 19.97 Filed at 09/07/2022 1200       This score is the patient's risk of an unplanned readmission within 30 days of being discharged (0 -100%). The score is based on dignosis, age, lab data, medications, orders, and past utilization.   Low:  0-14.9   Medium: 15-21.9   High: 22-29.9   Extreme: 30 and above          Admitted From: Home Disposition: Home  Recommendations for Outpatient Follow-up:  Follow up with PCP in 1-2 weeks Please obtain BMP/CBC in one week Follow-up with neurology in 4 weeks Please continue to take both aspirin and Plavix for 3 weeks and then stop taking aspirin but continue taking Plavix afterwards. Please follow up with your PCP on the following pending results: Unresulted Labs (From admission, onward)     Start     Ordered   09/07/22 0614  Magnesium  Add-on,   AD       Question:  Specimen collection method  Answer:  Lab=Lab collect   09/07/22 0613   09/07/22 8119  Basic metabolic panel  Daily,   R      09/06/22 0526   09/07/22 0500  Hemoglobin A1c  Once,   R        09/07/22 0500              Home Health: None-patient declined Equipment/Devices: Rollator, she already has that  Discharge Condition: Stable CODE STATUS: Full code Diet recommendation: Cardiac  Subjective: Patient seen and examined.  She has no complaints.  She is eager to go home.  Brief/Interim Summary: [name] is a 76 y.o. female with medical history significant of chronic pain, diabetes, presented with near syncope and hypotension.   Patient was recently admitted for encephalopathy and was found to have C.  difficile.  Also felt to have accidental OD of her high dose narcotics secondary to worsened kidney function (taking her usual dose but ODd due to not excreting narcotics because she had AKI from dehydration). Patient finished a course of p.o. vancomycin.   Patient states that she has not been feeling well for the last week or so.  She has some confusion and whenever she stands up she feels lightheaded and dizzy. Patient apparently had a syncopal episode at home.  Her blood pressure was in the 70s at that time.  EMS noticed that she was hypotensive with blood pressure 90/47 and gave her some IV fluids.  Denied overdose on her pain medicine.  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  Patient was on aspirin, she was started on Plavix.  Crestor was continued.  Neurology saw the patient.  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  Her losartan was held during this hospitalization and blood pressure still remained low so based on that, I am discontinuing her losartan.  Patient was seen by PT, she is deemed to be at her baseline, they although recommended home health but patient declined that.  She is cleared from PT as well as neurology.  However neurology has recommended 30-day monitor  Neurology   Procedures/Studies: VAS [place] LOWER EXTREMITY VENOUS (DVT)  Result Date: 09/06/2022  Lower Venous DVT Study Patient Name:  [name]  Date of Exam:   09/06/2022 Medical Rec #: [id number]       Accession #:    [id number] Date of Birth: May 05, 1946       Patient Gender: F Patient Age:   76 years Exam Location:  [facility] Procedure:      VAS [place] LOWER EXTREMITY VENOUS (DVT) Referring Phys: Cornelius Moras XU --------------------------------------------------------------------------------  Indications: Stroke, and Chronic bilateral lower extremity swelling.  Risk Factors: Hip surgery X 6. Comparison Study: No prior study Performing Technologist: Sharion Dove RVS  Examination Guidelines: A complete evaluation includes B-mode imaging, spectral Doppler, color Doppler, and power Doppler as needed of all accessible portions of each vessel. Bilateral testing is considered an integral part of a complete examination. Limited examinations for reoccurring indications may be performed as noted. The reflux portion of the exam is performed with the patient in reverse Trendelenburg.  +---------+---------------+---------+-----------+----------+--------------+ RIGHT    CompressibilityPhasicitySpontaneityPropertiesThrombus Aging +---------+---------------+---------+-----------+----------+--------------+ CFV      Full           Yes      Yes                                  +---------+---------------+---------+-----------+----------+--------------+ SFJ      Full                                                        +---------+---------------+---------+-----------+----------+--------------+ FV Prox  Full                                                        +---------+---------------+---------+-----------+----------+--------------+ FV Mid   Full                                                        +---------+---------------+---------+-----------+----------+--------------+ FV DistalFull                                                        +---------+---------------+---------+-----------+----------+--------------+ PFV      Full                                                        +---------+---------------+---------+-----------+----------+--------------+ POP      Full           Yes      Yes                                 +---------+---------------+---------+-----------+----------+--------------+  Neurology   Procedures/Studies: VAS [place] LOWER EXTREMITY VENOUS (DVT)  Result Date: 09/06/2022  Lower Venous DVT Study Patient Name:  [name]  Date of Exam:   09/06/2022 Medical Rec #: [id number]       Accession #:    [id number] Date of Birth: May 05, 1946       Patient Gender: F Patient Age:   76 years Exam Location:  [facility] Procedure:      VAS [place] LOWER EXTREMITY VENOUS (DVT) Referring Phys: Cornelius Moras XU --------------------------------------------------------------------------------  Indications: Stroke, and Chronic bilateral lower extremity swelling.  Risk Factors: Hip surgery X 6. Comparison Study: No prior study Performing Technologist: Sharion Dove RVS  Examination Guidelines: A complete evaluation includes B-mode imaging, spectral Doppler, color Doppler, and power Doppler as needed of all accessible portions of each vessel. Bilateral testing is considered an integral part of a complete examination. Limited examinations for reoccurring indications may be performed as noted. The reflux portion of the exam is performed with the patient in reverse Trendelenburg.  +---------+---------------+---------+-----------+----------+--------------+ RIGHT    CompressibilityPhasicitySpontaneityPropertiesThrombus Aging +---------+---------------+---------+-----------+----------+--------------+ CFV      Full           Yes      Yes                                  +---------+---------------+---------+-----------+----------+--------------+ SFJ      Full                                                        +---------+---------------+---------+-----------+----------+--------------+ FV Prox  Full                                                        +---------+---------------+---------+-----------+----------+--------------+ FV Mid   Full                                                        +---------+---------------+---------+-----------+----------+--------------+ FV DistalFull                                                        +---------+---------------+---------+-----------+----------+--------------+ PFV      Full                                                        +---------+---------------+---------+-----------+----------+--------------+ POP      Full           Yes      Yes                                 +---------+---------------+---------+-----------+----------+--------------+  PERO     Full                                                        +---------+---------------+---------+-----------+----------+--------------+     Summary: BILATERAL: - No evidence of deep vein thrombosis seen in the lower extremities, bilaterally. -No evidence of popliteal cyst, bilaterally.   *See table(s) above for measurements and observations. Electronically signed by Deitra Mayo MD on 09/06/2022 at 2:06:10 PM.    Final    ECHOCARDIOGRAM COMPLETE  Result Date: 09/06/2022    ECHOCARDIOGRAM REPORT   Patient Name:   [name] Date of Exam: 09/06/2022 Medical Rec #:  [id number]      Height:       67.0 in Accession #:    [id number]     Weight:       120.0 lb Date of Birth:  01-19-1946      BSA:          1.628 m Patient Age:    73 years       BP:           107/47 mmHg Patient Gender: F              HR:           76 bpm. Exam Location:  Inpatient Procedure: 2D Echo, Cardiac Doppler and Color Doppler Indications:    Stroke I63.9  History:        Patient has prior history of Echocardiogram examinations, most                 recent 06/06/2021. CAD and Previous Myocardial  Infarction, COPD,                 Arrythmias:Atrial Fibrillation; Risk Factors:Hypertension,                 Diabetes and Current Smoker. Breast Cancer.  Sonographer:    Ronny Flurry Referring Phys: Jennette Kettle, M  Sonographer Comments: Image acquisition challenging due to patient body habitus. IMPRESSIONS  1. Left ventricular ejection fraction, by estimation, is 55 to 60%. The left ventricle has normal function. The left ventricle has no regional wall motion abnormalities. There is mild left ventricular hypertrophy. Left ventricular diastolic parameters are consistent with Grade I diastolic dysfunction (impaired relaxation).  2. Right ventricular systolic function is normal. The right ventricular size is normal.  3. Left atrial size was mildly dilated.  4. The mitral valve is abnormal. Trivial mitral valve regurgitation. No evidence of mitral stenosis. Moderate mitral annular calcification.  5. The aortic valve is tricuspid. There is mild calcification of the aortic valve. Aortic valve regurgitation is not visualized. Aortic valve sclerosis is present, with no evidence of aortic valve stenosis.  6. The inferior vena cava is normal in size with greater than 50% respiratory variability, suggesting right atrial pressure of 3 mmHg. FINDINGS  Left Ventricle: Left ventricular ejection fraction, by estimation, is 55 to 60%. The left ventricle has normal function. The left ventricle has no regional wall motion abnormalities. The left ventricular internal cavity size was normal in size. There is  mild left ventricular hypertrophy. Left ventricular diastolic parameters are consistent with Grade I diastolic dysfunction (impaired relaxation). Right Ventricle: The right ventricular size is normal. No increase in right ventricular wall thickness. Right ventricular systolic  PERO     Full                                                        +---------+---------------+---------+-----------+----------+--------------+     Summary: BILATERAL: - No evidence of deep vein thrombosis seen in the lower extremities, bilaterally. -No evidence of popliteal cyst, bilaterally.   *See table(s) above for measurements and observations. Electronically signed by Deitra Mayo MD on 09/06/2022 at 2:06:10 PM.    Final    ECHOCARDIOGRAM COMPLETE  Result Date: 09/06/2022    ECHOCARDIOGRAM REPORT   Patient Name:   [name] Date of Exam: 09/06/2022 Medical Rec #:  [id number]      Height:       67.0 in Accession #:    [id number]     Weight:       120.0 lb Date of Birth:  01-19-1946      BSA:          1.628 m Patient Age:    73 years       BP:           107/47 mmHg Patient Gender: F              HR:           76 bpm. Exam Location:  Inpatient Procedure: 2D Echo, Cardiac Doppler and Color Doppler Indications:    Stroke I63.9  History:        Patient has prior history of Echocardiogram examinations, most                 recent 06/06/2021. CAD and Previous Myocardial  Infarction, COPD,                 Arrythmias:Atrial Fibrillation; Risk Factors:Hypertension,                 Diabetes and Current Smoker. Breast Cancer.  Sonographer:    Ronny Flurry Referring Phys: Jennette Kettle, M  Sonographer Comments: Image acquisition challenging due to patient body habitus. IMPRESSIONS  1. Left ventricular ejection fraction, by estimation, is 55 to 60%. The left ventricle has normal function. The left ventricle has no regional wall motion abnormalities. There is mild left ventricular hypertrophy. Left ventricular diastolic parameters are consistent with Grade I diastolic dysfunction (impaired relaxation).  2. Right ventricular systolic function is normal. The right ventricular size is normal.  3. Left atrial size was mildly dilated.  4. The mitral valve is abnormal. Trivial mitral valve regurgitation. No evidence of mitral stenosis. Moderate mitral annular calcification.  5. The aortic valve is tricuspid. There is mild calcification of the aortic valve. Aortic valve regurgitation is not visualized. Aortic valve sclerosis is present, with no evidence of aortic valve stenosis.  6. The inferior vena cava is normal in size with greater than 50% respiratory variability, suggesting right atrial pressure of 3 mmHg. FINDINGS  Left Ventricle: Left ventricular ejection fraction, by estimation, is 55 to 60%. The left ventricle has normal function. The left ventricle has no regional wall motion abnormalities. The left ventricular internal cavity size was normal in size. There is  mild left ventricular hypertrophy. Left ventricular diastolic parameters are consistent with Grade I diastolic dysfunction (impaired relaxation). Right Ventricle: The right ventricular size is normal. No increase in right ventricular wall thickness. Right ventricular systolic  Neurology   Procedures/Studies: VAS [place] LOWER EXTREMITY VENOUS (DVT)  Result Date: 09/06/2022  Lower Venous DVT Study Patient Name:  [name]  Date of Exam:   09/06/2022 Medical Rec #: [id number]       Accession #:    [id number] Date of Birth: May 05, 1946       Patient Gender: F Patient Age:   76 years Exam Location:  [facility] Procedure:      VAS [place] LOWER EXTREMITY VENOUS (DVT) Referring Phys: Cornelius Moras XU --------------------------------------------------------------------------------  Indications: Stroke, and Chronic bilateral lower extremity swelling.  Risk Factors: Hip surgery X 6. Comparison Study: No prior study Performing Technologist: Sharion Dove RVS  Examination Guidelines: A complete evaluation includes B-mode imaging, spectral Doppler, color Doppler, and power Doppler as needed of all accessible portions of each vessel. Bilateral testing is considered an integral part of a complete examination. Limited examinations for reoccurring indications may be performed as noted. The reflux portion of the exam is performed with the patient in reverse Trendelenburg.  +---------+---------------+---------+-----------+----------+--------------+ RIGHT    CompressibilityPhasicitySpontaneityPropertiesThrombus Aging +---------+---------------+---------+-----------+----------+--------------+ CFV      Full           Yes      Yes                                  +---------+---------------+---------+-----------+----------+--------------+ SFJ      Full                                                        +---------+---------------+---------+-----------+----------+--------------+ FV Prox  Full                                                        +---------+---------------+---------+-----------+----------+--------------+ FV Mid   Full                                                        +---------+---------------+---------+-----------+----------+--------------+ FV DistalFull                                                        +---------+---------------+---------+-----------+----------+--------------+ PFV      Full                                                        +---------+---------------+---------+-----------+----------+--------------+ POP      Full           Yes      Yes                                 +---------+---------------+---------+-----------+----------+--------------+  PERO     Full                                                        +---------+---------------+---------+-----------+----------+--------------+     Summary: BILATERAL: - No evidence of deep vein thrombosis seen in the lower extremities, bilaterally. -No evidence of popliteal cyst, bilaterally.   *See table(s) above for measurements and observations. Electronically signed by Deitra Mayo MD on 09/06/2022 at 2:06:10 PM.    Final    ECHOCARDIOGRAM COMPLETE  Result Date: 09/06/2022    ECHOCARDIOGRAM REPORT   Patient Name:   [name] Date of Exam: 09/06/2022 Medical Rec #:  [id number]      Height:       67.0 in Accession #:    [id number]     Weight:       120.0 lb Date of Birth:  01-19-1946      BSA:          1.628 m Patient Age:    73 years       BP:           107/47 mmHg Patient Gender: F              HR:           76 bpm. Exam Location:  Inpatient Procedure: 2D Echo, Cardiac Doppler and Color Doppler Indications:    Stroke I63.9  History:        Patient has prior history of Echocardiogram examinations, most                 recent 06/06/2021. CAD and Previous Myocardial  Infarction, COPD,                 Arrythmias:Atrial Fibrillation; Risk Factors:Hypertension,                 Diabetes and Current Smoker. Breast Cancer.  Sonographer:    Ronny Flurry Referring Phys: Jennette Kettle, M  Sonographer Comments: Image acquisition challenging due to patient body habitus. IMPRESSIONS  1. Left ventricular ejection fraction, by estimation, is 55 to 60%. The left ventricle has normal function. The left ventricle has no regional wall motion abnormalities. There is mild left ventricular hypertrophy. Left ventricular diastolic parameters are consistent with Grade I diastolic dysfunction (impaired relaxation).  2. Right ventricular systolic function is normal. The right ventricular size is normal.  3. Left atrial size was mildly dilated.  4. The mitral valve is abnormal. Trivial mitral valve regurgitation. No evidence of mitral stenosis. Moderate mitral annular calcification.  5. The aortic valve is tricuspid. There is mild calcification of the aortic valve. Aortic valve regurgitation is not visualized. Aortic valve sclerosis is present, with no evidence of aortic valve stenosis.  6. The inferior vena cava is normal in size with greater than 50% respiratory variability, suggesting right atrial pressure of 3 mmHg. FINDINGS  Left Ventricle: Left ventricular ejection fraction, by estimation, is 55 to 60%. The left ventricle has normal function. The left ventricle has no regional wall motion abnormalities. The left ventricular internal cavity size was normal in size. There is  mild left ventricular hypertrophy. Left ventricular diastolic parameters are consistent with Grade I diastolic dysfunction (impaired relaxation). Right Ventricle: The right ventricular size is normal. No increase in right ventricular wall thickness. Right ventricular systolic  Physician Discharge Summary  [name] ZOX:[id number] DOB: 1946-05-11 DOA: 09/05/2022  PCP: Vernie Shanks, MD (Inactive)  Admit date: 09/05/2022 Discharge date: 09/07/2022 30 Day Unplanned Readmission Risk Score    Flowsheet Row ED to Hosp-Admission (Current) from 09/05/2022 in [facility]  30 Day Unplanned Readmission Risk Score (%) 19.97 Filed at 09/07/2022 1200       This score is the patient's risk of an unplanned readmission within 30 days of being discharged (0 -100%). The score is based on dignosis, age, lab data, medications, orders, and past utilization.   Low:  0-14.9   Medium: 15-21.9   High: 22-29.9   Extreme: 30 and above          Admitted From: Home Disposition: Home  Recommendations for Outpatient Follow-up:  Follow up with PCP in 1-2 weeks Please obtain BMP/CBC in one week Follow-up with neurology in 4 weeks Please continue to take both aspirin and Plavix for 3 weeks and then stop taking aspirin but continue taking Plavix afterwards. Please follow up with your PCP on the following pending results: Unresulted Labs (From admission, onward)     Start     Ordered   09/07/22 0614  Magnesium  Add-on,   AD       Question:  Specimen collection method  Answer:  Lab=Lab collect   09/07/22 0613   09/07/22 8119  Basic metabolic panel  Daily,   R      09/06/22 0526   09/07/22 0500  Hemoglobin A1c  Once,   R        09/07/22 0500              Home Health: None-patient declined Equipment/Devices: Rollator, she already has that  Discharge Condition: Stable CODE STATUS: Full code Diet recommendation: Cardiac  Subjective: Patient seen and examined.  She has no complaints.  She is eager to go home.  Brief/Interim Summary: [name] is a 76 y.o. female with medical history significant of chronic pain, diabetes, presented with near syncope and hypotension.   Patient was recently admitted for encephalopathy and was found to have C.  difficile.  Also felt to have accidental OD of her high dose narcotics secondary to worsened kidney function (taking her usual dose but ODd due to not excreting narcotics because she had AKI from dehydration). Patient finished a course of p.o. vancomycin.   Patient states that she has not been feeling well for the last week or so.  She has some confusion and whenever she stands up she feels lightheaded and dizzy. Patient apparently had a syncopal episode at home.  Her blood pressure was in the 70s at that time.  EMS noticed that she was hypotensive with blood pressure 90/47 and gave her some IV fluids.  Denied overdose on her pain medicine.  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  Patient was on aspirin, she was started on Plavix.  Crestor was continued.  Neurology saw the patient.  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  Her losartan was held during this hospitalization and blood pressure still remained low so based on that, I am discontinuing her losartan.  Patient was seen by PT, she is deemed to be at her baseline, they although recommended home health but patient declined that.  She is cleared from PT as well as neurology.  However neurology has recommended 30-day monitor

## 2022-09-07 NOTE — Evaluation (Signed)
Physical Therapy Evaluation Patient Details Name: [name] MRN: [id number] DOB: 1945-10-24 Today's Date: 09/07/2022  History of Present Illness  Patient is a 76 y/o female who presents on 12/22 with syncopal episodes and AMS. Found to have AKI secondary to dehydration and hypotension. 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 PMH includes HTN, DM, A-fib, COPD, CAD, breast ca, MI.  Clinical Impression  Patient presents with generalized weakness, pain, impaired balance and impaired mobility s/p above. Pt lives at home with her spouse and son and reports being Mod I for ADls/IADLs at baseline, using rollator PRN. Today, pt reports her chronic pain is not under control impacting her mobility. Tolerated short distance ambulation with HHA and furniture walking for support to/from bathroom in a flexed posture due to kyphosis. Pt reports she is feeling close to baseline with regards to mobility except the pain. Will have support at home from spouse and son and encouraged to use her rollator until her strength improves. Pt is declining any HH services. Will follow acutely to maximize independence and mobility prior to return home.       Recommendations for follow up therapy are one component of a multi-disciplinary discharge planning process, led by the attending physician.  Recommendations may be updated based on patient status, additional functional criteria and insurance authorization.  Follow Up Recommendations No PT follow up (declining HH services)      Assistance Recommended at Discharge Intermittent Supervision/Assistance  Patient can return home with the following  A little help with walking and/or transfers;A little help with bathing/dressing/bathroom;Assistance with cooking/housework;Assist for transportation;Direct  supervision/assist for medications management    Equipment Recommendations None recommended by PT  Recommendations for Other Services       Functional Status Assessment Patient has had a recent decline in their functional status and demonstrates the ability to make significant improvements in function in a reasonable and predictable amount of time.     Precautions / Restrictions Precautions Precautions: Fall Restrictions Weight Bearing Restrictions: No      Mobility  Bed Mobility Overal bed mobility: Needs Assistance Bed Mobility: Supine to Sit, Sit to Supine     Supine to sit: Supervision, HOB elevated Sit to supine: Supervision   General bed mobility comments: No assist needed.    Transfers Overall transfer level: Needs assistance Equipment used: 1 person hand held assist Transfers: Sit to/from Stand Sit to Stand: Min guard           General transfer comment: Min guard for safety. Stood from [employer], from toilet x1. Min A to steady once upright    Ambulation/Gait Ambulation/Gait assistance: Min guard Gait Distance (Feet): 15 Feet (x2 bouts) Assistive device: 1 person hand held assist, None Gait Pattern/deviations: Step-through pattern, Decreased stride length, Trunk flexed Gait velocity: decreased Gait velocity interpretation: <1.31 ft/sec, indicative of household ambulator   General Gait Details: SLow, mildly unsteady gait with HHA to get to bathroom and furniture walking to return to bed, flexed posture due to kyphosis. Chronic pain.  Stairs            Wheelchair Mobility    Modified Rankin (Stroke Patients Only)       Balance Overall balance assessment: Mild deficits observed, not formally tested  Pertinent Vitals/Pain Pain Assessment Pain Assessment: 0-10 Pain Score: 5  Pain Location: back Pain Descriptors / Indicators: Sore, Grimacing, Guarding, Discomfort Pain  Intervention(s): Monitored during session, Repositioned, Limited activity within patient's tolerance    Home Living Family/patient expects to be discharged to:: Private residence Living Arrangements: Spouse/significant other;Children Available Help at Discharge: Family;Available 24 hours/day Type of Home: House Home Access: Level entry       Home Layout: Multi-level;Able to live on main level with bedroom/bathroom Home Equipment: Rollator (4 wheels)      Prior Function Prior Level of Function : Independent/Modified Independent             Mobility Comments: using rollator PRN, spouse/pt share IADls. Drives. Son assists as well as  needed. helps with grocery shopping. Son works doing Academic librarian. ADLs Comments: independent, no falls reported     Hand Dominance   Dominant Hand: Right    Extremity/Trunk Assessment   Upper Extremity Assessment Upper Extremity Assessment: Defer to OT evaluation    Lower Extremity Assessment Lower Extremity Assessment: Generalized weakness (but functional)    Cervical / Trunk Assessment Cervical / Trunk Assessment: Kyphotic  Communication   Communication: No difficulties  Cognition Arousal/Alertness: Awake/alert Behavior During Therapy: WFL for tasks assessed/performed Overall Cognitive Status: Within Functional Limits for tasks assessed                                 General Comments: Distracted by pain        General Comments General comments (skin integrity, edema, etc.): Reports feeling close to baseline except pain is not under control.    Exercises     Assessment/Plan    PT Assessment Patient needs continued PT services  PT Problem List Decreased mobility;Pain;Decreased balance       PT Treatment Interventions Therapeutic exercise;Gait training;Patient/family education;Therapeutic activities;Functional mobility training;Balance training    PT Goals (Current goals can be found in the Care Plan  section)  Acute Rehab PT Goals Patient Stated Goal: to go home PT Goal Formulation: With patient Time For Goal Achievement: 09/21/22 Potential to Achieve Goals: Good    Frequency Min 3X/week     Co-evaluation               AM-PAC PT "6 Clicks" Mobility  Outcome Measure Help needed turning from your back to your side while in a flat bed without using bedrails?: None Help needed moving from lying on your back to sitting on the side of a flat bed without using bedrails?: None Help needed moving to and from a bed to a chair (including a wheelchair)?: A Little Help needed standing up from a chair using your arms (e.g., wheelchair or bedside chair)?: A Little Help needed to walk in hospital room?: Total Help needed climbing 3-5 steps with a railing? : A Little 6 Click Score: 18    End of Session Equipment Utilized During Treatment: Gait belt Activity Tolerance: Patient limited by pain Patient left: in bed;with call bell/phone within reach;with bed alarm set Nurse Communication: Mobility status PT Visit Diagnosis: Pain;Unsteadiness on feet (R26.81);Difficulty in walking, not elsewhere classified (R26.2) Pain - part of body:  (back)    Time: 1610-9604 PT Time Calculation (min) (ACUTE ONLY): 20 min   Charges:   PT Evaluation $PT Eval Moderate Complexity: 1 Mod          Jamiracle Avants H, PT, DPT Acute Rehabilitation Services Secure chat preferred Office  [phone]     Blake Divine A Colson Barco 09/07/2022, 10:03 AM

## 2022-09-08 ENCOUNTER — Other Ambulatory Visit: Payer: Self-pay | Admitting: Home Health

## 2022-09-08 DIAGNOSIS — I639 Cerebral infarction, unspecified: Secondary | ICD-10-CM

## 2022-09-08 NOTE — Progress Notes (Signed)
30 days event monitor ordered for CVA per neuro request, established patient with Dr Martinique, if monitor has concerning finding, we will schedule follow up appointment for the patient.

## 2022-09-10 ENCOUNTER — Encounter (HOSPITAL_COMMUNITY): Payer: Self-pay

## 2022-09-10 ENCOUNTER — Other Ambulatory Visit: Payer: Self-pay | Admitting: Home Health

## 2022-09-10 ENCOUNTER — Emergency Department (HOSPITAL_COMMUNITY): Payer: Medicare Other

## 2022-09-10 ENCOUNTER — Emergency Department (HOSPITAL_COMMUNITY)
Admission: EM | Admit: 2022-09-10 | Discharge: 2022-09-10 | Disposition: A | Discharge status: Home / Self Care | Payer: Medicare Other | Attending: Emergency Medicine | Admitting: Emergency Medicine

## 2022-09-10 ENCOUNTER — Encounter: Payer: Self-pay | Admitting: Radiology

## 2022-09-10 DIAGNOSIS — S0990XA Unspecified injury of head, initial encounter: Secondary | ICD-10-CM | POA: Diagnosis not present

## 2022-09-10 DIAGNOSIS — Y92009 Unspecified place in unspecified non-institutional (private) residence as the place of occurrence of the external cause: Secondary | ICD-10-CM | POA: Insufficient documentation

## 2022-09-10 DIAGNOSIS — Z23 Encounter for immunization: Secondary | ICD-10-CM | POA: Diagnosis not present

## 2022-09-10 DIAGNOSIS — Z7982 Long term (current) use of aspirin: Secondary | ICD-10-CM | POA: Insufficient documentation

## 2022-09-10 DIAGNOSIS — W19XXXA Unspecified fall, initial encounter: Secondary | ICD-10-CM | POA: Diagnosis not present

## 2022-09-10 DIAGNOSIS — Z7902 Long term (current) use of antithrombotics/antiplatelets: Secondary | ICD-10-CM | POA: Diagnosis not present

## 2022-09-10 DIAGNOSIS — T1490XA Injury, unspecified, initial encounter: Secondary | ICD-10-CM | POA: Diagnosis not present

## 2022-09-10 DIAGNOSIS — I639 Cerebral infarction, unspecified: Secondary | ICD-10-CM

## 2022-09-10 DIAGNOSIS — W01110A Fall on same level from slipping, tripping and stumbling with subsequent striking against sharp glass, initial encounter: Secondary | ICD-10-CM | POA: Insufficient documentation

## 2022-09-10 DIAGNOSIS — E119 Type 2 diabetes mellitus without complications: Secondary | ICD-10-CM | POA: Diagnosis not present

## 2022-09-10 DIAGNOSIS — Y9301 Activity, walking, marching and hiking: Secondary | ICD-10-CM | POA: Diagnosis not present

## 2022-09-10 DIAGNOSIS — S0181XA Laceration without foreign body of other part of head, initial encounter: Secondary | ICD-10-CM | POA: Diagnosis not present

## 2022-09-10 DIAGNOSIS — I2510 Atherosclerotic heart disease of native coronary artery without angina pectoris: Secondary | ICD-10-CM | POA: Diagnosis not present

## 2022-09-10 DIAGNOSIS — R609 Edema, unspecified: Secondary | ICD-10-CM | POA: Diagnosis not present

## 2022-09-10 DIAGNOSIS — R42 Dizziness and giddiness: Secondary | ICD-10-CM

## 2022-09-10 DIAGNOSIS — S0993XA Unspecified injury of face, initial encounter: Secondary | ICD-10-CM | POA: Diagnosis not present

## 2022-09-10 DIAGNOSIS — Z043 Encounter for examination and observation following other accident: Secondary | ICD-10-CM | POA: Diagnosis not present

## 2022-09-10 DIAGNOSIS — I10 Essential (primary) hypertension: Secondary | ICD-10-CM | POA: Insufficient documentation

## 2022-09-10 DIAGNOSIS — J449 Chronic obstructive pulmonary disease, unspecified: Secondary | ICD-10-CM | POA: Diagnosis not present

## 2022-09-10 DIAGNOSIS — I4891 Unspecified atrial fibrillation: Secondary | ICD-10-CM

## 2022-09-10 LAB — COMPREHENSIVE METABOLIC PANEL
ALT: 20 U/L (ref 0–44)
AST: 33 U/L (ref 15–41)
Albumin: 3.6 g/dL (ref 3.5–5.0)
Alkaline Phosphatase: 69 U/L (ref 38–126)
Anion gap: 9 (ref 5–15)
BUN: 16 mg/dL (ref 8–23)
CO2: 24 mmol/L (ref 22–32)
Calcium: 9 mg/dL (ref 8.9–10.3)
Chloride: 100 mmol/L (ref 98–111)
Creatinine, Ser: 0.82 mg/dL (ref 0.44–1.00)
GFR, Estimated: >60 mL/min (ref >60)
Glucose, Bld: 119 mg/dL — ABNORMAL HIGH (ref 70–99)
Potassium: 3.9 mmol/L (ref 3.5–5.1)
Sodium: 133 mmol/L — ABNORMAL LOW (ref 135–145)
Total Bilirubin: 0.5 mg/dL (ref 0.3–1.2)
Total Protein: 6.8 g/dL (ref 6.5–8.1)

## 2022-09-10 LAB — I-STAT CHEM 8, ED
BUN: 17 mg/dL (ref 8–23)
Calcium, Ion: 1.08 mmol/L — ABNORMAL LOW (ref 1.15–1.40)
Chloride: 95 mmol/L — ABNORMAL LOW (ref 98–111)
Creatinine, Ser: 0.7 mg/dL (ref 0.44–1.00)
Glucose, Bld: 118 mg/dL — ABNORMAL HIGH (ref 70–99)
HCT: 36 % (ref 36.0–46.0)
Hemoglobin: 12.2 g/dL (ref 12.0–15.0)
Potassium: 3.8 mmol/L (ref 3.5–5.1)
Sodium: 131 mmol/L — ABNORMAL LOW (ref 135–145)
TCO2: 27 mmol/L (ref 22–32)

## 2022-09-10 LAB — CBC
HCT: 36 % (ref 36.0–46.0)
Hemoglobin: 12.6 g/dL (ref 12.0–15.0)
MCH: 32 pg (ref 26.0–34.0)
MCHC: 35 g/dL (ref 30.0–36.0)
MCV: 91.4 fL (ref 80.0–100.0)
Platelets: 154 10*3/uL (ref 150–400)
RBC: 3.94 MIL/uL (ref 3.87–5.11)
RDW: 13.8 % (ref 11.5–15.5)
WBC: 5.5 10*3/uL (ref 4.0–10.5)
nRBC: 0 % (ref 0.0–0.2)

## 2022-09-10 LAB — MAGNESIUM: Magnesium: 1.7 mg/dL (ref 1.7–2.4)

## 2022-09-10 MED ORDER — LIDOCAINE-EPINEPHRINE (PF) 2 %-1:200000 IJ SOLN
10.0000 mL | Freq: Once | INTRAMUSCULAR | Status: AC
  Administered 2022-09-10: 10 mL via INTRADERMAL

## 2022-09-10 MED ORDER — ACETAMINOPHEN 325 MG PO TABS
650.0000 mg | ORAL_TABLET | Freq: Once | ORAL | Status: AC
  Administered 2022-09-10: 650 mg via ORAL
  Filled 2022-09-10: qty 2

## 2022-09-10 MED ORDER — TETANUS-DIPHTH-ACELL PERTUSSIS 5-2.5-18.5 LF-MCG/0.5 IM SUSY
0.5000 mL | PREFILLED_SYRINGE | Freq: Once | INTRAMUSCULAR | Status: AC
  Administered 2022-09-10: 0.5 mL via INTRAMUSCULAR
  Filled 2022-09-10: qty 0.5

## 2022-09-10 MED ORDER — LIDOCAINE-EPINEPHRINE (PF) 2 %-1:200000 IJ SOLN
INTRAMUSCULAR | Status: AC
  Administered 2022-09-10: 10 mL via INTRADERMAL
  Filled 2022-09-10: qty 20

## 2022-09-10 MED ORDER — LIDOCAINE-EPINEPHRINE (PF) 2 %-1:200000 IJ SOLN: 10.0000 mL | Freq: Once | INTRAMUSCULAR | Status: AC

## 2022-09-10 NOTE — ED Provider Notes (Signed)
[facility] EMERGENCY DEPARTMENT Provider Note   CSN: [id number] Arrival date & time: 09/10/22  1918     History  Chief Complaint  Patient presents with   [name] is a 76 y.o. female.   Fall  Patient presents after a fall.  Fall occurred at approximately 1:30 PM.  Patient was at home, walking through her house carrying a glass.  She tripped on a rug and fell forward.  An attempt did not spill her drink, she reached out forward with a glass.  Her head struck through the glass which had fallen against an ottoman.  This caused a laceration above her right eye.  Patient has had some mild pain to this area but has not had any other areas of discomfort since the fall.  She initially went to urgent care, where she sat in the waiting room for several hours.  When she was seen, it was identified that she is on Plavix.  EMS was called due to a traumatic fall on blood thinners.  EMS reports normal mentation during transit.  Since her fall, patient has developed increased swelling bruising to the right eye.  Before the swelling progressed, she did not have any visual changes.  She has been ambulatory today.  At baseline, she walks with a cane.  She lives at home with her husband.     Home Medications Prior to Admission medications   Medication Sig Start Date End Date Taking? Authorizing Provider  albuterol (PROVENTIL HFA;VENTOLIN HFA) 108 (90 BASE) MCG/ACT inhaler Inhale 2 puffs into the lungs every 6 (six) hours as needed for wheezing.     [provider]  ascorbic acid (VITAMIN C) 500 MG tablet Take 500 mg by mouth daily.    [provider]  aspirin EC 81 MG tablet Take 1 tablet (81 mg total) by mouth daily for 21 days. 09/07/22 09/28/22  Darliss Cheney, MD  BIOTIN EXTRA STRENGTH PO Take 500 mg by mouth daily.    [provider]  cholecalciferol (VITAMIN D3) 25 MCG (1000 UNIT) tablet Take 1,000 Units by mouth daily.    [provider]  clopidogrel (PLAVIX) 75 MG tablet Take 1 tablet (75 mg total) by mouth daily. 09/08/22 11/07/22  Darliss Cheney, MD  furosemide (LASIX) 20 MG tablet Take 1 tablet (20 mg total) by mouth daily as needed for edema. Patient taking differently: Take 20 mg by mouth daily. 12/27/19 09/07/23  Martinique, Peter M, MD  gabapentin (NEURONTIN) 600 MG tablet Take 600 mg by mouth 4 (four) times daily.     [provider]  metFORMIN (GLUCOPHAGE-XR) 500 MG 24 hr tablet Take 500 mg by mouth every evening.    [provider]  methocarbamol (ROBAXIN) 500 MG tablet Take 500 mg by mouth daily as needed for muscle spasms. 09/05/22   [provider]  morphine (MS CONTIN) 30 MG 12 hr tablet Take 30 mg by mouth 4 (four) times daily.  12/23/16   [provider]  Multiple Vitamin (MULTIVITAMIN) capsule Take 1 capsule by mouth daily. Centrum silver    [provider]  oxyCODONE (ROXICODONE) 15 MG immediate release tablet Take 15 mg by mouth 4 (four) times daily.    [provider]  rosuvastatin (CRESTOR) 10 MG tablet Take 1 tablet (10 mg total) by mouth daily. Please keep scheduled appointment 06/25/22   Martinique, Peter M, MD      Allergies    Azithromycin, Cefuroxime, Cephalexin,  Chlorhexidine, Prednisone, and Zithromax [azithromycin dihydrate]    Review of Systems   Review of Systems  HENT:  Positive for facial swelling.   Skin:  Positive for wound.  Hematological:  Bruises/bleeds easily.  All other systems reviewed and are negative.   Physical Exam Updated Vital Signs BP 110/64   Pulse 66   Temp 99 F (37.2 C)   Resp 16   Ht '5\' 2"'$  (1.575 m)   Wt 57.2 kg   SpO2 96%   BMI 23.05 kg/m  Physical Exam Vitals and nursing note reviewed.  Constitutional:      General: She is not in acute distress.    Appearance: Normal appearance. She is well-developed. She is not ill-appearing, toxic-appearing or diaphoretic.  HENT:     Head: Normocephalic.      Comments: 3 cm laceration on right forehead.      Right Ear: External ear normal.     Left Ear: External ear normal.     Nose: Nose normal.     Mouth/Throat:     Mouth: Mucous membranes are moist.     Pharynx: Oropharynx is clear.  Eyes:     Conjunctiva/sclera: Conjunctivae normal.     Comments: Right upper lip swelling and bruising.  Cardiovascular:     Rate and Rhythm: Normal rate and regular rhythm.     Heart sounds: No murmur heard. Pulmonary:     Effort: Pulmonary effort is normal. No respiratory distress.     Breath sounds: Normal breath sounds. No wheezing or rales.  Chest:     Chest wall: No tenderness.  Abdominal:     General: There is no distension.     Palpations: Abdomen is soft.     Tenderness: There is no abdominal tenderness.  Musculoskeletal:        General: No swelling. Normal range of motion.     Cervical back: Normal range of motion and neck supple. No rigidity or tenderness.     Right lower leg: No edema.     Left lower leg: No edema.  Skin:    General: Skin is warm and dry.     Capillary Refill: Capillary refill takes less than 2 seconds.     Coloration: Skin is not jaundiced or pale.  Neurological:     General: No focal deficit present.     Mental Status: She is alert and oriented to person, place, and time.     Cranial Nerves: No cranial nerve deficit.     Sensory: No sensory deficit.     Motor: No weakness.     Coordination: Coordination normal.  Psychiatric:        Mood and Affect: Mood normal.        Behavior: Behavior normal.        Thought Content: Thought content normal.        Judgment: Judgment normal.     ED Results / Procedures / Treatments   Labs (all labs ordered are listed, but only abnormal results are displayed) Labs Reviewed  COMPREHENSIVE METABOLIC PANEL - Abnormal; Notable for the following components:      Result Value   Sodium 133 (*)    Glucose, Bld 119 (*)    All other components within normal limits  I-STAT CHEM 8,  ED - Abnormal; Notable for the following components:   Sodium 131 (*)    Chloride 95 (*)    Glucose, Bld 118 (*)    Calcium, Ion 1.08 (*)  All other components within normal limits  CBC  MAGNESIUM  ETHANOL    EKG None  Radiology CT MAXILLOFACIAL WO CONTRAST  Result Date: 09/10/2022 CLINICAL DATA:  Facial trauma, blunt.  Fall. EXAM: CT MAXILLOFACIAL WITHOUT CONTRAST TECHNIQUE: Multidetector CT imaging of the maxillofacial structures was performed. Multiplanar CT image reconstructions were also generated. RADIATION DOSE REDUCTION: This exam was performed according to the departmental dose-optimization program which includes automated exposure control, adjustment of the mA and/or kV according to patient size and/or use of iterative reconstruction technique. COMPARISON:  None Available. FINDINGS: Osseous: No fracture or mandibular dislocation. No destructive process. Orbits: Slight irregularity noted along the floor of the right orbit which has a different appearance when compared to the left orbital floor. This is suspicious for slightly depressed orbital floor fracture. No entrapment. Globes intact. Sinuses: Clear Soft tissues: Soft tissue swelling over the right orbit, face and forehead. Limited intracranial: See head CT report IMPRESSION: Findings suspicious for slightly depressed right orbital floor fracture. No entrapment. Electronically Signed   By: Rolm Baptise M.D.   On: 09/10/2022 20:17   CT CERVICAL SPINE WO CONTRAST  Result Date: 09/10/2022 CLINICAL DATA:  Polytrauma, blunt.  Fall. EXAM: CT CERVICAL SPINE WITHOUT CONTRAST TECHNIQUE: Multidetector CT imaging of the cervical spine was performed without intravenous contrast. Multiplanar CT image reconstructions were also generated. RADIATION DOSE REDUCTION: This exam was performed according to the departmental dose-optimization program which includes automated exposure control, adjustment of the mA and/or kV according to patient size  and/or use of iterative reconstruction technique. COMPARISON:  08/08/2022 FINDINGS: Alignment: No subluxation. Skull base and vertebrae: No acute fracture or focal bone lesion. Soft tissues and spinal canal: No prevertebral fluid or swelling. No visible canal hematoma. Disc levels: Prior fusion from C3-C5. No disc herniation. Mild bilateral facet disease. Upper chest: No acute findings. Other: None IMPRESSION: No acute bony abnormality. Electronically Signed   By: Rolm Baptise M.D.   On: 09/10/2022 20:15   CT HEAD WO CONTRAST  Result Date: 09/10/2022 CLINICAL DATA:  Head trauma, moderate-severe.  Fall. EXAM: CT HEAD WITHOUT CONTRAST TECHNIQUE: Contiguous axial images were obtained from the base of the skull through the vertex without intravenous contrast. RADIATION DOSE REDUCTION: This exam was performed according to the departmental dose-optimization program which includes automated exposure control, adjustment of the mA and/or kV according to patient size and/or use of iterative reconstruction technique. COMPARISON:  09/05/2022 FINDINGS: Brain: None evolutionary changes noted in the bilateral cerebellar and right occipital infarct, better seen than on prior CT. No acute infarct or hemorrhage. No hydrocephalus. Mild cerebral atrophy. Vascular: No hyperdense vessel or unexpected calcification. Skull: No acute calvarial abnormality. 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 No acute infarct or hemorrhage. Electronically Signed   By: Rolm Baptise M.D.   On: 09/10/2022 20:12   DG Pelvis Portable  Result Date: 09/10/2022 CLINICAL DATA:  Fall EXAM: PORTABLE PELVIS 1-2 VIEWS COMPARISON:  Eleven 16 FINDINGS: Bilateral hip replacements. Normal AP alignment. No fracture. Degenerative changes in the visualized lower lumbar spine. SI joints symmetric and unremarkable. IMPRESSION: No acute bony  abnormality. Electronically Signed   By: Rolm Baptise M.D.   On: 09/10/2022 19:39   DG Chest Port 1 View  Result Date: 09/10/2022 CLINICAL DATA:  Fall EXAM: PORTABLE CHEST 1 VIEW COMPARISON:  09/05/2022 FINDINGS: Heart is normal size. Densely calcified mitral valve annulus and aorta. No aneurysm. No confluent  airspace opacity or effusion. No acute bony abnormality. IMPRESSION: No active disease. Electronically Signed   By: Rolm Baptise M.D.   On: 09/10/2022 19:38    Procedures .Marland KitchenLaceration Repair  Date/Time: 09/10/2022 9:48 PM  Performed by: Godfrey Pick, MD Authorized by: Godfrey Pick, MD   Consent:    Consent obtained:  Verbal   Consent given by:  Patient   Risks, benefits, and alternatives were discussed: yes     Risks discussed:  Infection, pain and poor cosmetic result   Alternatives discussed:  No treatment Universal protocol:    Procedure explained and questions answered to patient or proxy's satisfaction: yes     Imaging studies available: yes     Patient identity confirmed:  Verbally with patient Anesthesia:    Anesthesia method:  Topical application and local infiltration   Topical anesthetic:  LET   Local anesthetic:  Lidocaine 2% WITH epi Laceration details:    Location:  Face   Face location:  Forehead   Length (cm):  4   Depth (mm):  0.5 Pre-procedure details:    Preparation:  Patient was prepped and draped in usual sterile fashion and imaging obtained to evaluate for foreign bodies Exploration:    Hemostasis achieved with:  LET   Imaging obtained comment:  CT   Imaging outcome: foreign body not noted     Wound exploration: wound explored through full range of motion and entire depth of wound visualized     Contaminated: no   Treatment:    Area cleansed with:  Saline   Amount of cleaning:  Standard Skin repair:    Repair method:  Sutures   Suture size:  6-0   Suture material:  Nylon   Suture technique:  Simple interrupted   Number of sutures:   6 Approximation:    Approximation:  Close Repair type:    Repair type:  Simple Post-procedure details:    Dressing:  Antibiotic ointment   Procedure completion:  Tolerated well, no immediate complications     Medications Ordered in ED Medications  Tdap (BOOSTRIX) injection 0.5 mL (0.5 mLs Intramuscular Given 09/10/22 2041)  acetaminophen (TYLENOL) tablet 650 mg (650 mg Oral Given 09/10/22 2040)  lidocaine-EPINEPHrine (XYLOCAINE W/EPI) 2 %-1:200000 (PF) injection 10 mL (10 mLs Intradermal Given by Other 09/10/22 2007)  lidocaine-EPINEPHrine (XYLOCAINE W/EPI) 2 %-1:200000 (PF) injection 10 mL (10 mLs Intradermal Given by Other 09/10/22 2142)    ED Course/ Medical Decision Making/ A&P                           Medical Decision Making Amount and/or Complexity of Data Reviewed Labs: ordered. Radiology: ordered. ECG/medicine tests: ordered.  Risk OTC drugs. Prescription drug management.   This patient presents to the ED for concern of fall, this involves an extensive number of treatment options, and is a complaint that carries with it a high risk of complications and morbidity.  The differential diagnosis includes acute injuries   Co morbidities that complicate the patient evaluation  HLD, HTN, T2DM, COPD, CAD, EtOH abuse, arthritis   Additional history obtained:  Additional history obtained from EMS External records from outside source obtained and reviewed including EMR   Lab Tests:  I Ordered, and personally interpreted labs.  The pertinent results include: Normal kidney function, normal electrolytes, normal hemoglobin, no leukocytosis   Imaging Studies ordered:  I ordered imaging studies including x-ray of chest and pelvis, CT imaging of head, face, cervical spine  I independently visualized and interpreted imaging which showed slight irregularity of floor of right orbit suspicious for slightly depressed ordered for fracture without entrapment; no other acute  findings I agree with the radiologist interpretation   Cardiac Monitoring: / EKG:  The patient was maintained on a cardiac monitor.  I personally viewed and interpreted the cardiac monitored which showed an underlying rhythm of: Sinus rhythm   Problem List / ED Course / Critical interventions / Medication management  Patient is a pleasant 76 year old female presenting to the ED from urgent care due to a fall on thinners.  This fall occurred 6 hours prior to arrival.  She describes a mechanical fall at home caused by tripping on a rug and falling forward.  When she fell forward, she struck her forehead with a glass that she was carrying.  She sustained a laceration.  She has since developed right upper eyelid swelling and bruising.  On arrival, patient is alert and oriented.  She denies any areas of discomfort other than the area above her right eye.  Wound is dressed with Vaseline gauze and Kerlix.  When dressing removed, there is some mild oozing.  Let gel was applied to wound.  Patient to undergo CT imaging.  Tetanus updated.  Tylenol ordered.  CT imaging showed findings concerning for possible right orbital floor fracture without entrapment.  Patient was advised to avoid blowing her nose and to follow-up with ophthalmology once swelling improves.  She underwent laceration repair, as per procedure note above.  She was able to ambulate with minimal assistance, which she reports is her baseline.  She was discharged in stable condition. I ordered medication including Tylenol for analgesia; antihypertensive prophylaxis; lidocaine for laceration repair Reevaluation of the patient after these medicines showed that the patient improved I have reviewed the patients home medicines and have made adjustments as needed   Social Determinants of Health:  Lives at home with husband         Final Clinical Impression(s) / ED Diagnoses Final diagnoses:  Fall, initial encounter  Laceration of forehead,  initial encounter    Rx / DC Orders ED Discharge Orders     None         Godfrey Pick, MD 09/10/22 2241

## 2022-09-10 NOTE — ED Triage Notes (Signed)
Pt BIB EMS from urgent care, patient had a ground level fall on a rug at home at 1330. Laceration to the right eyebrow with bruising and swelling. Patient takes plavix.No LOC. Patient denies any neck or back pain or any other injuries.  EMS Vitals 158/92 90 HR RR 18 97% on RA

## 2022-09-10 NOTE — ED Notes (Signed)
Pt A&Ox4 ambulatory with independent gait. Pt states she will call a taxi to get home. She called her husband to inform him she would be coming home via taxi. Pt verbalized understanding of d/c instructions and follow up care.

## 2022-09-10 NOTE — Progress Notes (Signed)
Orthopedic Tech Progress Note Patient Details:  [name] [birth year] [id number] Level 2 Trauma  Patient ID: [name], female   DOB: 08-Sep-1946, 76 y.o.   MRN: [id number]  [name] 09/10/2022, 7:27 PM

## 2022-09-10 NOTE — ED Notes (Signed)
Pt requesting her eye be taped shut as it is painful when she attempts to open it. Applied gauze over eye and secured per patient's request. LET applied to forehead by EDP.

## 2022-09-10 NOTE — Discharge Instructions (Signed)
You received 6 sutures in your forehead wound.  These will need to be removed in 1 week.  Keep area clean and dry.  If area becomes increasingly red, swollen, painful, or begins draining pus, please return to the emergency department.  On your CT scan, there was a possible right orbital floor fracture.  This is a common lumen typically heals without complication.  He should avoid blowing your nose for the next several days to not increase the pressure around this injury.  Below is a telephone number to call for a eye doctor follow-up.  Schedule a follow-up appointment in 1 week.

## 2022-09-10 NOTE — ED Notes (Signed)
Trauma Response Nurse Documentation   [name] is a 76 y.o. female arriving to [facility] ED via EMS  On clopidogrel 75 mg daily. Trauma was activated as a Level 2 by ED charge RN based on the following trauma criteria Elderly patients > 65 with head trauma on anti-coagulation (excluding ASA). Trauma team at the bedside on patient arrival.   Patient cleared for CT by Dr. Doren Custard EDP. Pt transported to CT with trauma response nurse present to monitor. RN remained with the patient throughout their absence from the department for clinical observation.   GCS 15.  History   Past Medical History:  Diagnosis Date   Asthma    Breast cancer ([place])    right breast   COPD (chronic obstructive pulmonary disease) (HCC)    Coronary artery disease    Diverticulum of esophagus    Elevated LFTs    Emphysema lung (HCC)    ETOH abuse    H/O atrial fibrillation without current medication    only one time when she had sepsis   Hyperlipidemia    Hypertension    hx of but not on any medications   Myocardial infarction ([place]) 2000   OA (osteoarthritis) of knee    Osteoarthritis    Tobacco abuse      Past Surgical History:  Procedure Laterality Date   ANTERIOR HIP REVISION Right 10/22/2015   Procedure: RIGHT  HIP REVISION;  Surgeon: Paralee Cancel, MD;  Location: [facility];  Service: Orthopedics;  Laterality: Right;   APPLICATION OF WOUND VAC N/A 06/20/2015   Procedure: APPLICATION OF INCISIONAL WOUND VAC;  Surgeon: Melina Schools, MD;  Location: [place];  Service: Orthopedics;  Laterality: N/A;   BREAST SURGERY  1991   right mastectomy   CARDIAC CATHETERIZATION  04/05/2009   EF 60%   CARDIOVASCULAR STRESS TEST  01/31/2005   EF 58%   CESAREAN SECTION  '78, '80, '81   x 3   CORONARY ANGIOPLASTY  08/1998   x2 OF A BIFURCATION OM-1, OM-2 LESION   CORONARY ANGIOPLASTY WITH STENT PLACEMENT  01/1999   MID FIRST OBTUSE MARGINAL VESSEL   CORONARY ANGIOPLASTY WITH STENT PLACEMENT  07/1999   STENTING AT THE  CRUX OF THE RIGHT CORONARY ARTERY WITH A 3.8MM X 18MM TETRA STENT   DIRECT LARYNGOSCOPY N/A 05/03/2015   Procedure: DIRECT LARYNGOSCOPY;  Surgeon: Jodi Marble, MD;  Location: [place];  Service: ENT;  Laterality: N/A;   EYE SURGERY  05/18/2014,06/01/2014   BILATERAL CATARACT S WITH LENS IMPLANTS   GASTROSTOMY N/A 05/04/2015   Procedure: OPEN GASTROSTOMY WITH TUBE PLACEMENT;  Surgeon: Donnie Mesa, MD;  Location: [place];  Service: General;  Laterality: N/A;   GASTROSTOMY N/A 11/13/2016   Procedure: INSERTION OF GASTROSTOMY TUBE;  Surgeon: Coralie Keens, MD;  Location: [place];  Service: General;  Laterality: N/A;   HARDWARE REMOVAL N/A 05/03/2015   Procedure: HARDWARE REMOVAL;  Surgeon: Melina Schools, MD;  Location: [place];  Service: Orthopedics;  Laterality: N/A;   HIP CLOSED REDUCTION Right 04/26/2015   Procedure: CLOSED REDUCTION HIP;  Surgeon: Melina Schools, MD;  Location: [facility];  Service: Orthopedics;  Laterality: Right;   HYSTEROSCOPY     D & C   INCISION AND DRAINAGE ABSCESS N/A 05/03/2015   Procedure: INCISION AND DRAINAGE CERVICAL  ABSCESS AND REMOVAL OF HARDWARE;  Surgeon: Melina Schools, MD;  Location: [place];  Service: Orthopedics;  Laterality: N/A;   IR CM INJ ANY COLONIC TUBE W/FLUORO  10/14/2017  IR CM INJ ANY COLONIC TUBE W/FLUORO  10/23/2017   IR CM INJ ANY COLONIC TUBE W/FLUORO  10/28/2017   IR REPLC GASTRO/COLONIC TUBE PERCUT W/FLUORO  09/22/2017   JOINT REPLACEMENT  08/2011   bilateral hip   JOINT REPLACEMENT  01/2012   right hip   MASTECTOMY     neck fusion  2011   ORIF FEMUR FRACTURE Right 06/14/2019   Procedure: ORIF PERI PROSTHETIC FEMUR FRACTURE;  Surgeon: Paralee Cancel, MD;  Location: [place];  Service: Orthopedics;  Laterality: Right;   PELVIC LAPAROSCOPY  2002   RSO-     RADICAL NECK DISSECTION N/A 11/08/2016   Procedure: INCISION AND DRAINAGE OF NECK ABSCESS;  Surgeon: Jerrell Belfast, MD;  Location: [place];  Service: ENT;  Laterality: N/A;   RADIOLOGY WITH ANESTHESIA Right  06/28/2015   Procedure: MRI OF CERVICAL SPINE  AND RIGHT HIP  WITH AND WITHOUT CONTRAST    (RADIOLOGY WITH ANESTHESIA);  Surgeon: Medication Radiologist, MD;  Location: [place];  Service: Radiology;  Laterality: Right;   REMOVAL OF GASTROSTOMY TUBE N/A 11/14/2016   Procedure: REMOVAL OF GASTROSTOMY TUBE W/ REPLACEMENT OF GASTROSTOMY TUBE;  Surgeon: Coralie Keens, MD;  Location: [place];  Service: General;  Laterality: N/A;   RIGID ESOPHAGOSCOPY N/A 05/03/2015   Procedure: RIGID ESOPHAGOSCOPY;  Surgeon: Jodi Marble, MD;  Location: [place];  Service: ENT;  Laterality: N/A;   TONSILLECTOMY AND ADENOIDECTOMY     TOTAL HIP ARTHROPLASTY  08/2010   bilat   VULVECTOMY  1981   partial       Initial Focused Assessment (If applicable, or please see trauma documentation): Alert/oriented female presents via EMS after a fall with approx 2 inch laceration and surrounding hematoma above right eye brow, bleeding controlled Airway patent/unobstructed, BS clear No obvious uncontrolled hemorrhage, bleeding to forehead lac controlled GCS 15  CT's Completed:   CT Head, CT Maxillofacial, and CT C-Spine   Interventions:  Trauma lab draw Portable chest and pelvis XRAY TDAP CT head, cervical, maxface Wound care/lac repair  Plan for disposition:  Pending workup  Consults completed:  none at the time of this note.  Event Summary: Presents after a fall with resulting forehead laceration and hematoma. Bleeding controlled. LET applied by EDP, escorted to CT. POC pending workup results.   Bedside handoff with ED RN Threasa Beards.    [name]  Trauma Response RN  Please call TRN at [phone] for further assistance.

## 2022-09-12 LAB — HEMOGLOBIN A1C
Hgb A1c MFr Bld: 6.2 % — ABNORMAL HIGH (ref 4.8–5.6)
Mean Plasma Glucose: 131 mg/dL

## 2022-09-13 DIAGNOSIS — G894 Chronic pain syndrome: Secondary | ICD-10-CM | POA: Diagnosis not present

## 2022-09-13 DIAGNOSIS — M5412 Radiculopathy, cervical region: Secondary | ICD-10-CM | POA: Diagnosis not present

## 2022-09-13 DIAGNOSIS — M545 Low back pain, unspecified: Secondary | ICD-10-CM | POA: Diagnosis not present

## 2022-09-13 DIAGNOSIS — Z79891 Long term (current) use of opiate analgesic: Secondary | ICD-10-CM | POA: Diagnosis not present

## 2022-09-14 ENCOUNTER — Other Ambulatory Visit: Payer: Self-pay | Admitting: Cardiology

## 2022-09-18 DIAGNOSIS — S01111D Laceration without foreign body of right eyelid and periocular area, subsequent encounter: Secondary | ICD-10-CM | POA: Diagnosis not present

## 2022-09-18 DIAGNOSIS — Z5189 Encounter for other specified aftercare: Secondary | ICD-10-CM | POA: Diagnosis not present

## 2022-09-19 ENCOUNTER — Ambulatory Visit: Payer: Medicare Other | Attending: Cardiology

## 2022-09-19 DIAGNOSIS — I639 Cerebral infarction, unspecified: Secondary | ICD-10-CM | POA: Diagnosis not present

## 2022-09-19 DIAGNOSIS — I4891 Unspecified atrial fibrillation: Secondary | ICD-10-CM | POA: Diagnosis not present

## 2022-09-19 DIAGNOSIS — R42 Dizziness and giddiness: Secondary | ICD-10-CM | POA: Diagnosis not present

## 2022-09-21 ENCOUNTER — Emergency Department (HOSPITAL_COMMUNITY)
Admission: EM | Admit: 2022-09-21 | Discharge: 2022-09-21 | Disposition: A | Discharge status: Home / Self Care | Payer: Medicare Other | Attending: Emergency Medicine | Admitting: Emergency Medicine

## 2022-09-21 ENCOUNTER — Emergency Department (HOSPITAL_COMMUNITY): Payer: Medicare Other

## 2022-09-21 ENCOUNTER — Encounter (HOSPITAL_COMMUNITY): Payer: Self-pay | Admitting: Emergency Medicine

## 2022-09-21 DIAGNOSIS — I10 Essential (primary) hypertension: Secondary | ICD-10-CM | POA: Diagnosis not present

## 2022-09-21 DIAGNOSIS — R531 Weakness: Secondary | ICD-10-CM

## 2022-09-21 DIAGNOSIS — E876 Hypokalemia: Secondary | ICD-10-CM | POA: Insufficient documentation

## 2022-09-21 DIAGNOSIS — R059 Cough, unspecified: Secondary | ICD-10-CM | POA: Diagnosis not present

## 2022-09-21 DIAGNOSIS — Z7982 Long term (current) use of aspirin: Secondary | ICD-10-CM | POA: Diagnosis not present

## 2022-09-21 DIAGNOSIS — R0682 Tachypnea, not elsewhere classified: Secondary | ICD-10-CM | POA: Diagnosis not present

## 2022-09-21 DIAGNOSIS — R000 Tachycardia, unspecified: Secondary | ICD-10-CM | POA: Insufficient documentation

## 2022-09-21 DIAGNOSIS — R509 Fever, unspecified: Secondary | ICD-10-CM | POA: Diagnosis not present

## 2022-09-21 DIAGNOSIS — Z20822 Contact with and (suspected) exposure to covid-19: Secondary | ICD-10-CM | POA: Insufficient documentation

## 2022-09-21 DIAGNOSIS — Z7401 Bed confinement status: Secondary | ICD-10-CM | POA: Diagnosis not present

## 2022-09-21 DIAGNOSIS — R062 Wheezing: Secondary | ICD-10-CM | POA: Diagnosis not present

## 2022-09-21 LAB — COMPREHENSIVE METABOLIC PANEL
ALT: 16 U/L (ref 0–44)
AST: 18 U/L (ref 15–41)
Albumin: 2.9 g/dL — ABNORMAL LOW (ref 3.5–5.0)
Alkaline Phosphatase: 72 U/L (ref 38–126)
Anion gap: 12 (ref 5–15)
BUN: 11 mg/dL (ref 8–23)
CO2: 25 mmol/L (ref 22–32)
Calcium: 8.7 mg/dL — ABNORMAL LOW (ref 8.9–10.3)
Chloride: 97 mmol/L — ABNORMAL LOW (ref 98–111)
Creatinine, Ser: 0.56 mg/dL (ref 0.44–1.00)
GFR, Estimated: >60 mL/min (ref >60)
Glucose, Bld: 108 mg/dL — ABNORMAL HIGH (ref 70–99)
Potassium: 2.8 mmol/L — ABNORMAL LOW (ref 3.5–5.1)
Sodium: 134 mmol/L — ABNORMAL LOW (ref 135–145)
Total Bilirubin: 0.9 mg/dL (ref 0.3–1.2)
Total Protein: 7.1 g/dL (ref 6.5–8.1)

## 2022-09-21 LAB — CBC WITH DIFFERENTIAL/PLATELET
Abs Immature Granulocytes: 0.05 10*3/uL (ref 0.00–0.07)
Basophils Absolute: 0 10*3/uL (ref 0.0–0.1)
Basophils Relative: 0 %
Eosinophils Absolute: 0 10*3/uL (ref 0.0–0.5)
Eosinophils Relative: 0 %
HCT: 39.4 % (ref 36.0–46.0)
Hemoglobin: 13.6 g/dL (ref 12.0–15.0)
Immature Granulocytes: 1 %
Lymphocytes Relative: 10 %
Lymphs Abs: 1 10*3/uL (ref 0.7–4.0)
MCH: 30.6 pg (ref 26.0–34.0)
MCHC: 34.5 g/dL (ref 30.0–36.0)
MCV: 88.5 fL (ref 80.0–100.0)
Monocytes Absolute: 0.6 10*3/uL (ref 0.1–1.0)
Monocytes Relative: 6 %
Neutro Abs: 8.6 10*3/uL — ABNORMAL HIGH (ref 1.7–7.7)
Neutrophils Relative %: 83 %
Platelets: 303 10*3/uL (ref 150–400)
RBC: 4.45 MIL/uL (ref 3.87–5.11)
RDW: 13.6 % (ref 11.5–15.5)
WBC: 10.3 10*3/uL (ref 4.0–10.5)
nRBC: 0 % (ref 0.0–0.2)

## 2022-09-21 LAB — RESP PANEL BY RT-PCR (RSV, FLU A&B, COVID)  RVPGX2
Influenza A by PCR: NEGATIVE
Influenza B by PCR: NEGATIVE
Resp Syncytial Virus by PCR: NEGATIVE
SARS Coronavirus 2 by RT PCR: NEGATIVE

## 2022-09-21 MED ORDER — POTASSIUM CHLORIDE CRYS ER 20 MEQ PO TBCR
60.0000 meq | EXTENDED_RELEASE_TABLET | Freq: Once | ORAL | Status: AC
  Administered 2022-09-21: 60 meq via ORAL
  Filled 2022-09-21: qty 3

## 2022-09-21 MED ORDER — POTASSIUM CHLORIDE 10 MEQ/100ML IV SOLN
10.0000 meq | INTRAVENOUS | Status: AC
  Administered 2022-09-21 (×2): 10 meq via INTRAVENOUS
  Filled 2022-09-21 (×2): qty 100

## 2022-09-21 NOTE — ED Triage Notes (Signed)
Pt here from home with c/o fever and sob , pt took tylenol at home for what she thought was a fever , , takes morphine and oxycodone for chronic pain, also c/o some slight sob , no fever on arrival

## 2022-09-21 NOTE — Discharge Instructions (Signed)
As discussed, today's evaluation has been generally reassuring.  However, you are found to have notably abnormal potassium value requiring medications provided to you in the emergency department tonight.  Please be sure to follow-up with your physician in the next 2 or 3 days for repeat lab evaluation and follow-up.  Return here for concerning changes in your condition.

## 2022-09-21 NOTE — ED Provider Notes (Signed)
[facility] DEPT Provider Note   CSN: [id number] Arrival date & time: 09/21/22  1704     History  No chief complaint on file.   [name] is a 77 y.o. female.  HPI Patient with multiple medical issues presents with concern of weakness.  She notes that she is recently developed feverishness, dyspnea.  She takes oxycodone, and morphine for chronic pain.  Today she also took Tylenol due to concern for fever.  History is notable for multiple medical issues, and admission last month following episode of possible encephalopathy, with findings concerning for possible stroke, either low perfusion or basilar artery as well as treatment for C. difficile infection.  Is unclear if the patient has been unwell since that discharge.    Home Medications Prior to Admission medications   Medication Sig Start Date End Date Taking? Authorizing Provider  albuterol (PROVENTIL HFA;VENTOLIN HFA) 108 (90 BASE) MCG/ACT inhaler Inhale 2 puffs into the lungs every 6 (six) hours as needed for wheezing.     [provider]  ascorbic acid (VITAMIN C) 500 MG tablet Take 500 mg by mouth daily.    [provider]  aspirin EC 81 MG tablet Take 1 tablet (81 mg total) by mouth daily for 21 days. 09/07/22 09/28/22  Darliss Cheney, MD  BIOTIN EXTRA STRENGTH PO Take 500 mg by mouth daily.    [provider]  cholecalciferol (VITAMIN D3) 25 MCG (1000 UNIT) tablet Take 1,000 Units by mouth daily.    [provider]  clopidogrel (PLAVIX) 75 MG tablet Take 1 tablet (75 mg total) by mouth daily. 09/08/22 11/07/22  Darliss Cheney, MD  furosemide (LASIX) 20 MG tablet Take 1 tablet (20 mg total) by mouth daily as needed for edema. Patient taking differently: Take 20 mg by mouth daily. 12/27/19 09/07/23  Martinique, Peter M, MD  gabapentin (NEURONTIN) 600 MG tablet Take 600 mg by mouth 4 (four) times daily.     [provider]  metFORMIN (GLUCOPHAGE-XR) 500 MG 24  hr tablet Take 500 mg by mouth every evening.    [provider]  methocarbamol (ROBAXIN) 500 MG tablet Take 500 mg by mouth daily as needed for muscle spasms. 09/05/22   [provider]  morphine (MS CONTIN) 30 MG 12 hr tablet Take 30 mg by mouth 4 (four) times daily.  12/23/16   [provider]  Multiple Vitamin (MULTIVITAMIN) capsule Take 1 capsule by mouth daily. Centrum silver    [provider]  oxyCODONE (ROXICODONE) 15 MG immediate release tablet Take 15 mg by mouth 4 (four) times daily.    [provider]  rosuvastatin (CRESTOR) 10 MG tablet TAKE 1 TABLET (10 MG TOTAL) BY MOUTH DAILY. PLEASE KEEP SCHEDULED APPOINTMENT 09/15/22   Martinique, Peter M, MD      Allergies    Azithromycin, Cefuroxime, Cephalexin, Chlorhexidine, Prednisone, and Zithromax [azithromycin dihydrate]    Review of Systems   Review of Systems  Unable to perform ROS: Acuity of condition    Physical Exam Updated Vital Signs BP (!) 177/94   Pulse 62   Temp 97.9 F (36.6 C)   Resp (!) 28   SpO2 95%  Physical Exam Vitals and nursing note reviewed.  Constitutional:      Appearance: She is well-developed. She is ill-appearing.  HENT:     Head: Normocephalic.     Jaw: No trismus.   Eyes:     Conjunctiva/sclera: Conjunctivae normal.  Cardiovascular:  Rate and Rhythm: Regular rhythm. Tachycardia present.  Pulmonary:     Effort: Tachypnea present.     Breath sounds: Decreased air movement present.  Abdominal:     General: There is no distension.     Tenderness: There is no guarding.  Skin:    General: Skin is warm and dry.  Neurological:     Mental Status: She is alert.     Cranial Nerves: No cranial nerve deficit.     Motor: Weakness and atrophy present.  Psychiatric:        Mood and Affect: Mood normal.     Comments: Withdrawn, but speaks, answers "briefly, seemingly appropriately.     ED Results / Procedures / Treatments   Labs (all labs ordered are  listed, but only abnormal results are displayed) Labs Reviewed  COMPREHENSIVE METABOLIC PANEL - Abnormal; Notable for the following components:      Result Value   Sodium 134 (*)    Potassium 2.8 (*)    Chloride 97 (*)    Glucose, Bld 108 (*)    Calcium 8.7 (*)    Albumin 2.9 (*)    All other components within normal limits  CBC WITH DIFFERENTIAL/PLATELET - Abnormal; Notable for the following components:   Neutro Abs 8.6 (*)    All other components within normal limits  RESP PANEL BY RT-PCR (RSV, FLU A&B, COVID)  [id number]    EKG None  Radiology DG Chest 2 View  Result Date: 09/21/2022 CLINICAL DATA:  Cough and fever EXAM: CHEST - 2 VIEW COMPARISON:  Chest x-ray 09/10/2022 FINDINGS: There are postsurgical changes in the left upper lobe, unchanged. The cardiomediastinal silhouette is stable, the heart is mildly enlarged. There is no lung infiltrate, pleural effusion or pneumothorax. There are healed left-sided rib fractures. Right axillary surgical clips are present. IMPRESSION: 1. No active cardiopulmonary disease. 2. Stable postsurgical changes in the left upper lobe. Electronically Signed   By: Ronney Asters M.D.   On: 09/21/2022 18:38    Procedures Procedures    Medications Ordered in ED Medications  potassium chloride SA (KLOR-CON M) CR tablet 60 mEq (60 mEq Oral Given 09/21/22 1938)  potassium chloride 10 mEq in 100 mL IVPB (0 mEq Intravenous Stopped 09/21/22 2034)    ED Course/ Medical Decision Making/ A&P This patient with a Hx of multiple medical issues including chronic pain, recent admission for C. difficile infection, encephalopathy presents to the ED for concern of weakness, this involves an extensive number of treatment options, and is a complaint that carries with it a high risk of complications and morbidity.    The differential diagnosis includes sepsis, bacteremia, pneumonia, medication overdose, progression of chronic disease   Social Determinants of  Health:  Chronic pain, advanced age  Additional history obtained:  Additional history and/or information obtained from chart review, notable for spittle course, discharge summary included below: Brief/Interim Summary: [name] is a 77 y.o. female with medical history significant of chronic pain, diabetes, presented with near syncope and hypotension.   Patient was recently admitted for encephalopathy and was found to have C. difficile.  Also felt to have accidental OD of her high dose narcotics secondary to worsened kidney function (taking her usual dose but ODd due to not excreting narcotics because she had AKI from dehydration). Patient finished a course of p.o. vancomycin.   Patient states that she has not been feeling well for the last week or so.  She has some confusion and whenever she stands  up she feels lightheaded and dizzy. Patient apparently had a syncopal episode at home.  Her blood pressure was in the 70s at that time.  EMS noticed that she was hypotensive with blood pressure 90/47 and gave her some IV fluids.  Denied overdose on her pain medicine.  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  Patient was on aspirin, she was started on Plavix.  Crestor was continued.  Neurology saw the patient.  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  Her losartan was held during this hospitalization and blood pressure still remained low so based on that, I am discontinuing her losartan.  Patient was seen by PT, she is deemed to be at her baseline, they although recommended home health but patient declined that.  She is cleared from PT as well as neurology.  However neurology has recommended 30-day monitor for which I have sent a message to [facility]/coordinator of the cardiology.  Patient also had mild AKI which resolved.   After the initial evaluation, orders, including: X-ray  labs bronchodilators were initiated.   Patient placed on Cardiac and Pulse-Oximetry Monitors. The patient was maintained on a cardiac monitor.  The cardiac monitored showed an rhythm of 80 sinus normal The patient was also maintained on pulse oximetry. The readings were typically 94% room air borderline   On repeat evaluation of the patient improved  Lab Tests:  I personally interpreted labs.  The pertinent results include: Hypokalemia, potassium 2.8  Imaging Studies ordered:  I independently visualized and interpreted imaging which showed chest x-ray without remarkable findings I agree with the radiologist interpretation Dispostion / Final MDM:  After consideration of the diagnostic results and the patient's response to treatment, patient is now awake, alert, agreeable to returning home.  This adult female with multiple medical problems, recent hospitalization presents with concern for fever at home, with some generalized weakness.  Here the patient is neurologically unremarkable aside from mild sluggishness.  Vital signs are reassuring, she is afebrile, COVID, flu, RSV negative.  She is found to have notably abnormal potassium, is tolerant of oral and received both oral and IV repletion.  After hours of monitoring without decompensation or other acute changes, following resuscitation as above, the hospitalization was a consideration given her baseline medical problems, patient was discharged to follow-up with outpatient physician for repeat chemistry panel and 2 days.  Final Clinical Impression(s) / ED Diagnoses Final diagnoses:  Weakness  Hypokalemia     Carmin Muskrat, MD 09/21/22 2215

## 2022-09-21 NOTE — ED Notes (Signed)
Called [facility] for transport back home

## 2022-09-23 ENCOUNTER — Telehealth: Payer: Self-pay | Admitting: *Deleted

## 2022-09-23 NOTE — Telephone Encounter (Signed)
Received alert from monitor company that on 09/20/22 @ 9 pm the patient had a pause. The rhythm strips are uninterruptable because of the artifact. It appears that the beats were low voltage and were not detected. Spoke with pt, she reports nothing going on at the time of the recording.

## 2022-10-07 ENCOUNTER — Inpatient Hospital Stay: Payer: Medicare Other | Admitting: Neurology

## 2022-10-13 ENCOUNTER — Encounter: Payer: Self-pay | Admitting: *Deleted

## 2022-10-18 ENCOUNTER — Other Ambulatory Visit: Payer: Self-pay | Admitting: Cardiology

## 2022-10-31 ENCOUNTER — Emergency Department (HOSPITAL_COMMUNITY): Payer: Medicare Other

## 2022-10-31 ENCOUNTER — Inpatient Hospital Stay (HOSPITAL_COMMUNITY)
Admission: EM | Admit: 2022-10-31 | Discharge: 2022-11-02 | DRG: 072 | Disposition: A | Discharge status: Home / Self Care | Payer: Medicare Other | Attending: Internal Medicine | Admitting: Internal Medicine

## 2022-10-31 ENCOUNTER — Encounter (HOSPITAL_COMMUNITY): Payer: Self-pay | Admitting: Emergency Medicine

## 2022-10-31 ENCOUNTER — Other Ambulatory Visit: Payer: Self-pay

## 2022-10-31 DIAGNOSIS — Z853 Personal history of malignant neoplasm of breast: Secondary | ICD-10-CM

## 2022-10-31 DIAGNOSIS — I700 Atherosclerosis of aorta: Secondary | ICD-10-CM | POA: Diagnosis present

## 2022-10-31 DIAGNOSIS — I499 Cardiac arrhythmia, unspecified: Secondary | ICD-10-CM | POA: Diagnosis not present

## 2022-10-31 DIAGNOSIS — R627 Adult failure to thrive: Secondary | ICD-10-CM

## 2022-10-31 DIAGNOSIS — J4489 Other specified chronic obstructive pulmonary disease: Secondary | ICD-10-CM | POA: Diagnosis not present

## 2022-10-31 DIAGNOSIS — Z1152 Encounter for screening for COVID-19: Secondary | ICD-10-CM

## 2022-10-31 DIAGNOSIS — Z823 Family history of stroke: Secondary | ICD-10-CM | POA: Diagnosis not present

## 2022-10-31 DIAGNOSIS — Z833 Family history of diabetes mellitus: Secondary | ICD-10-CM

## 2022-10-31 DIAGNOSIS — I252 Old myocardial infarction: Secondary | ICD-10-CM

## 2022-10-31 DIAGNOSIS — J439 Emphysema, unspecified: Secondary | ICD-10-CM | POA: Diagnosis not present

## 2022-10-31 DIAGNOSIS — Z79899 Other long term (current) drug therapy: Secondary | ICD-10-CM | POA: Diagnosis not present

## 2022-10-31 DIAGNOSIS — G894 Chronic pain syndrome: Secondary | ICD-10-CM | POA: Diagnosis present

## 2022-10-31 DIAGNOSIS — Z8673 Personal history of transient ischemic attack (TIA), and cerebral infarction without residual deficits: Secondary | ICD-10-CM | POA: Diagnosis not present

## 2022-10-31 DIAGNOSIS — A419 Sepsis, unspecified organism: Secondary | ICD-10-CM | POA: Diagnosis not present

## 2022-10-31 DIAGNOSIS — Z7984 Long term (current) use of oral hypoglycemic drugs: Secondary | ICD-10-CM

## 2022-10-31 DIAGNOSIS — R531 Weakness: Secondary | ICD-10-CM

## 2022-10-31 DIAGNOSIS — G8929 Other chronic pain: Secondary | ICD-10-CM | POA: Diagnosis present

## 2022-10-31 DIAGNOSIS — T4275XA Adverse effect of unspecified antiepileptic and sedative-hypnotic drugs, initial encounter: Secondary | ICD-10-CM | POA: Diagnosis present

## 2022-10-31 DIAGNOSIS — Z881 Allergy status to other antibiotic agents status: Secondary | ICD-10-CM | POA: Diagnosis not present

## 2022-10-31 DIAGNOSIS — R410 Disorientation, unspecified: Secondary | ICD-10-CM

## 2022-10-31 DIAGNOSIS — E119 Type 2 diabetes mellitus without complications: Secondary | ICD-10-CM | POA: Diagnosis present

## 2022-10-31 DIAGNOSIS — I2510 Atherosclerotic heart disease of native coronary artery without angina pectoris: Secondary | ICD-10-CM

## 2022-10-31 DIAGNOSIS — I10 Essential (primary) hypertension: Secondary | ICD-10-CM | POA: Diagnosis not present

## 2022-10-31 DIAGNOSIS — Z9011 Acquired absence of right breast and nipple: Secondary | ICD-10-CM | POA: Diagnosis not present

## 2022-10-31 DIAGNOSIS — E1165 Type 2 diabetes mellitus with hyperglycemia: Secondary | ICD-10-CM | POA: Diagnosis not present

## 2022-10-31 DIAGNOSIS — G9341 Metabolic encephalopathy: Principal | ICD-10-CM | POA: Diagnosis present

## 2022-10-31 DIAGNOSIS — M1710 Unilateral primary osteoarthritis, unspecified knee: Secondary | ICD-10-CM | POA: Diagnosis not present

## 2022-10-31 DIAGNOSIS — Z888 Allergy status to other drugs, medicaments and biological substances status: Secondary | ICD-10-CM

## 2022-10-31 DIAGNOSIS — T40605A Adverse effect of unspecified narcotics, initial encounter: Secondary | ICD-10-CM | POA: Diagnosis present

## 2022-10-31 DIAGNOSIS — I4891 Unspecified atrial fibrillation: Secondary | ICD-10-CM | POA: Diagnosis present

## 2022-10-31 DIAGNOSIS — E785 Hyperlipidemia, unspecified: Secondary | ICD-10-CM | POA: Diagnosis present

## 2022-10-31 DIAGNOSIS — Z8249 Family history of ischemic heart disease and other diseases of the circulatory system: Secondary | ICD-10-CM

## 2022-10-31 DIAGNOSIS — E876 Hypokalemia: Secondary | ICD-10-CM | POA: Diagnosis not present

## 2022-10-31 DIAGNOSIS — R6889 Other general symptoms and signs: Secondary | ICD-10-CM | POA: Diagnosis not present

## 2022-10-31 DIAGNOSIS — F17210 Nicotine dependence, cigarettes, uncomplicated: Secondary | ICD-10-CM | POA: Diagnosis present

## 2022-10-31 DIAGNOSIS — Z743 Need for continuous supervision: Secondary | ICD-10-CM | POA: Diagnosis not present

## 2022-10-31 DIAGNOSIS — R0902 Hypoxemia: Secondary | ICD-10-CM | POA: Diagnosis not present

## 2022-10-31 DIAGNOSIS — R4182 Altered mental status, unspecified: Secondary | ICD-10-CM

## 2022-10-31 DIAGNOSIS — R197 Diarrhea, unspecified: Secondary | ICD-10-CM | POA: Diagnosis not present

## 2022-10-31 DIAGNOSIS — Z7902 Long term (current) use of antithrombotics/antiplatelets: Secondary | ICD-10-CM

## 2022-10-31 LAB — CBC WITH DIFFERENTIAL/PLATELET
Abs Immature Granulocytes: 0.02 10*3/uL (ref 0.00–0.07)
Basophils Absolute: 0 10*3/uL (ref 0.0–0.1)
Basophils Relative: 0 %
Eosinophils Absolute: 0 10*3/uL (ref 0.0–0.5)
Eosinophils Relative: 0 %
HCT: 42.9 % (ref 36.0–46.0)
Hemoglobin: 14.9 g/dL (ref 12.0–15.0)
Immature Granulocytes: 0 %
Lymphocytes Relative: 21 %
Lymphs Abs: 1.9 10*3/uL (ref 0.7–4.0)
MCH: 30.5 pg (ref 26.0–34.0)
MCHC: 34.7 g/dL (ref 30.0–36.0)
MCV: 87.7 fL (ref 80.0–100.0)
Monocytes Absolute: 0.6 10*3/uL (ref 0.1–1.0)
Monocytes Relative: 7 %
Neutro Abs: 6.6 10*3/uL (ref 1.7–7.7)
Neutrophils Relative %: 72 %
Platelets: 302 10*3/uL (ref 150–400)
RBC: 4.89 MIL/uL (ref 3.87–5.11)
RDW: 13.8 % (ref 11.5–15.5)
WBC: 9.1 10*3/uL (ref 4.0–10.5)
nRBC: 0 % (ref 0.0–0.2)

## 2022-10-31 LAB — URINALYSIS, ROUTINE W REFLEX MICROSCOPIC
Bacteria, UA: NONE SEEN
Bilirubin Urine: NEGATIVE
Glucose, UA: NEGATIVE mg/dL
Hgb urine dipstick: NEGATIVE
Ketones, ur: 20 mg/dL — AB
Leukocytes,Ua: NEGATIVE
Nitrite: NEGATIVE
Protein, ur: 100 mg/dL — AB
Specific Gravity, Urine: 1.015 (ref 1.005–1.030)
pH: 7 (ref 5.0–8.0)

## 2022-10-31 LAB — COMPREHENSIVE METABOLIC PANEL
ALT: 16 U/L (ref 0–44)
AST: 24 U/L (ref 15–41)
Albumin: 4 g/dL (ref 3.5–5.0)
Alkaline Phosphatase: 73 U/L (ref 38–126)
Anion gap: 13 (ref 5–15)
BUN: 10 mg/dL (ref 8–23)
CO2: 25 mmol/L (ref 22–32)
Calcium: 9.1 mg/dL (ref 8.9–10.3)
Chloride: 97 mmol/L — ABNORMAL LOW (ref 98–111)
Creatinine, Ser: 0.72 mg/dL (ref 0.44–1.00)
GFR, Estimated: >60 mL/min (ref >60)
Glucose, Bld: 89 mg/dL (ref 70–99)
Potassium: 2.6 mmol/L — CL (ref 3.5–5.1)
Sodium: 135 mmol/L (ref 135–145)
Total Bilirubin: 1.2 mg/dL (ref 0.3–1.2)
Total Protein: 7.9 g/dL (ref 6.5–8.1)

## 2022-10-31 LAB — BLOOD GAS, ARTERIAL
Acid-base deficit: 0.3 mmol/L (ref 0.0–2.0)
Bicarbonate: 22.1 mmol/L (ref 20.0–28.0)
Drawn by: 560031
O2 Content: 21 L/min
O2 Saturation: 99.7 %
Patient temperature: 37
pCO2 arterial: 29 mmHg — ABNORMAL LOW (ref 32–48)
pH, Arterial: 7.49 — ABNORMAL HIGH (ref 7.35–7.45)
pO2, Arterial: 88 mmHg (ref 83–108)

## 2022-10-31 LAB — RESP PANEL BY RT-PCR (RSV, FLU A&B, COVID)  RVPGX2
Influenza A by PCR: NEGATIVE
Influenza B by PCR: NEGATIVE
Resp Syncytial Virus by PCR: NEGATIVE
SARS Coronavirus 2 by RT PCR: NEGATIVE

## 2022-10-31 LAB — CBG MONITORING, ED: Glucose-Capillary: 77 mg/dL (ref 70–99)

## 2022-10-31 LAB — PROTIME-INR
INR: 1 (ref 0.8–1.2)
Prothrombin Time: 12.8 seconds (ref 11.4–15.2)

## 2022-10-31 LAB — MAGNESIUM: Magnesium: 2 mg/dL (ref 1.7–2.4)

## 2022-10-31 LAB — GLUCOSE, CAPILLARY: Glucose-Capillary: 83 mg/dL (ref 70–99)

## 2022-10-31 LAB — CK: Total CK: 84 U/L (ref 38–234)

## 2022-10-31 LAB — AMMONIA: Ammonia: 16 umol/L (ref 9–35)

## 2022-10-31 LAB — LACTIC ACID, PLASMA: Lactic Acid, Venous: 1.7 mmol/L (ref 0.5–1.9)

## 2022-10-31 LAB — TROPONIN I (HIGH SENSITIVITY): Troponin I (High Sensitivity): 18 ng/L — ABNORMAL HIGH (ref <18)

## 2022-10-31 MED ORDER — CLOPIDOGREL BISULFATE 75 MG PO TABS
75.0000 mg | ORAL_TABLET | Freq: Every day | ORAL | Status: DC
  Administered 2022-10-31 – 2022-11-02 (×3): 75 mg via ORAL
  Filled 2022-10-31 (×3): qty 1

## 2022-10-31 MED ORDER — ORAL CARE MOUTH RINSE: 15.0000 mL | OROMUCOSAL | Status: DC | PRN

## 2022-10-31 MED ORDER — POTASSIUM CHLORIDE CRYS ER 20 MEQ PO TBCR
40.0000 meq | EXTENDED_RELEASE_TABLET | ORAL | Status: AC
  Administered 2022-10-31 (×2): 40 meq via ORAL
  Filled 2022-10-31 (×2): qty 2

## 2022-10-31 MED ORDER — POTASSIUM CHLORIDE 10 MEQ/100ML IV SOLN
10.0000 meq | INTRAVENOUS | Status: AC
  Administered 2022-10-31 (×3): 10 meq via INTRAVENOUS
  Filled 2022-10-31 (×3): qty 100

## 2022-10-31 MED ORDER — HYDRALAZINE HCL 20 MG/ML IJ SOLN
5.0000 mg | Freq: Four times a day (QID) | INTRAMUSCULAR | Status: DC | PRN
  Administered 2022-11-01: 5 mg via INTRAVENOUS
  Filled 2022-10-31: qty 1

## 2022-10-31 MED ORDER — INSULIN ASPART 100 UNIT/ML IJ SOLN
0.0000 [IU] | Freq: Three times a day (TID) | INTRAMUSCULAR | Status: DC
  Administered 2022-11-01: 2 [IU] via SUBCUTANEOUS
  Filled 2022-10-31: qty 0.15

## 2022-10-31 MED ORDER — POTASSIUM CHLORIDE CRYS ER 20 MEQ PO TBCR
40.0000 meq | EXTENDED_RELEASE_TABLET | Freq: Once | ORAL | Status: DC
  Filled 2022-10-31: qty 2

## 2022-10-31 MED ORDER — ONDANSETRON HCL 4 MG/2ML IJ SOLN: 4.0000 mg | Freq: Four times a day (QID) | INTRAMUSCULAR | Status: DC | PRN

## 2022-10-31 MED ORDER — POTASSIUM CHLORIDE CRYS ER 20 MEQ PO TBCR
40.0000 meq | EXTENDED_RELEASE_TABLET | Freq: Once | ORAL | Status: AC
  Administered 2022-10-31: 40 meq via ORAL

## 2022-10-31 MED ORDER — LACTATED RINGERS IV BOLUS
1000.0000 mL | Freq: Once | INTRAVENOUS | Status: AC
  Administered 2022-10-31: 1000 mL via INTRAVENOUS

## 2022-10-31 MED ORDER — GABAPENTIN 300 MG PO CAPS
300.0000 mg | ORAL_CAPSULE | Freq: Three times a day (TID) | ORAL | Status: DC
  Administered 2022-10-31 – 2022-11-02 (×5): 300 mg via ORAL
  Filled 2022-10-31 (×5): qty 1

## 2022-10-31 MED ORDER — ACETAMINOPHEN 325 MG PO TABS: 650.0000 mg | ORAL_TABLET | Freq: Four times a day (QID) | ORAL | Status: DC | PRN

## 2022-10-31 MED ORDER — ONDANSETRON HCL 4 MG PO TABS: 4.0000 mg | ORAL_TABLET | Freq: Four times a day (QID) | ORAL | Status: DC | PRN

## 2022-10-31 MED ORDER — ALBUTEROL SULFATE (2.5 MG/3ML) 0.083% IN NEBU
2.5000 mg | INHALATION_SOLUTION | Freq: Four times a day (QID) | RESPIRATORY_TRACT | Status: DC
  Administered 2022-10-31 – 2022-11-02 (×7): 2.5 mg via RESPIRATORY_TRACT
  Filled 2022-10-31 (×7): qty 3

## 2022-10-31 MED ORDER — OXYCODONE HCL 5 MG PO TABS
10.0000 mg | ORAL_TABLET | Freq: Four times a day (QID) | ORAL | Status: DC | PRN
  Administered 2022-11-01 – 2022-11-02 (×3): 10 mg via ORAL
  Filled 2022-10-31 (×3): qty 2

## 2022-10-31 MED ORDER — MORPHINE SULFATE ER 15 MG PO TBCR
15.0000 mg | EXTENDED_RELEASE_TABLET | Freq: Four times a day (QID) | ORAL | Status: DC
  Administered 2022-10-31 – 2022-11-02 (×6): 15 mg via ORAL
  Filled 2022-10-31 (×6): qty 1

## 2022-10-31 MED ORDER — ACETAMINOPHEN 650 MG RE SUPP: 650.0000 mg | Freq: Four times a day (QID) | RECTAL | Status: DC | PRN

## 2022-10-31 MED ORDER — INSULIN ASPART 100 UNIT/ML IJ SOLN
0.0000 [IU] | Freq: Every day | INTRAMUSCULAR | Status: DC
  Filled 2022-10-31: qty 0.05

## 2022-10-31 MED ORDER — ROSUVASTATIN CALCIUM 10 MG PO TABS
10.0000 mg | ORAL_TABLET | Freq: Every day | ORAL | Status: DC
  Administered 2022-10-31 – 2022-11-02 (×3): 10 mg via ORAL
  Filled 2022-10-31 (×2): qty 0.5
  Filled 2022-10-31 (×2): qty 1

## 2022-10-31 MED ORDER — TRAZODONE HCL 50 MG PO TABS
25.0000 mg | ORAL_TABLET | Freq: Every evening | ORAL | Status: DC | PRN
  Administered 2022-10-31: 25 mg via ORAL
  Filled 2022-10-31: qty 1

## 2022-10-31 MED ORDER — ENOXAPARIN SODIUM 40 MG/0.4ML IJ SOSY
40.0000 mg | PREFILLED_SYRINGE | INTRAMUSCULAR | Status: DC
  Administered 2022-10-31 – 2022-11-01 (×2): 40 mg via SUBCUTANEOUS
  Filled 2022-10-31 (×2): qty 0.4

## 2022-10-31 NOTE — ED Provider Notes (Signed)
[place] EMERGENCY DEPARTMENT AT [facility] Provider Note   CSN: [id number] Arrival date & time: 10/31/22  1131     History  Chief Complaint  Patient presents with   Altered Mental Status    [name] is a 77 y.o. female.  Patient generally weak, was noted to be wearing soiled clothing and appeared generally confused. Pt very poor historian - level 5 caveat. Pt indicates in general she just doesn't feel well, generally weak, but otherwise pt denies focal or specific c/o. Denies fever/chills. No headache. No neck/back pain. No chest pain or sob. No abd pain or vomiting/diarrhea. No dysuria. No extremity pain or injury.   The history is provided by the patient, medical records and the EMS personnel. The history is limited by the condition of the patient.  Altered Mental Status Presenting symptoms: confusion   Associated symptoms: no abdominal pain, no fever, no headaches, no rash and no vomiting        Home Medications Prior to Admission medications   Medication Sig Start Date End Date Taking? Authorizing Provider  albuterol (PROVENTIL HFA;VENTOLIN HFA) 108 (90 BASE) MCG/ACT inhaler Inhale 2 puffs into the lungs every 6 (six) hours as needed for wheezing.     [provider]  ascorbic acid (VITAMIN C) 500 MG tablet Take 500 mg by mouth daily.    [provider]  BIOTIN EXTRA STRENGTH PO Take 500 mg by mouth daily.    [provider]  cholecalciferol (VITAMIN D3) 25 MCG (1000 UNIT) tablet Take 1,000 Units by mouth daily.    [provider]  clopidogrel (PLAVIX) 75 MG tablet Take 1 tablet (75 mg total) by mouth daily. 09/08/22 11/07/22  Darliss Cheney, MD  furosemide (LASIX) 20 MG tablet Take 1 tablet (20 mg total) by mouth daily as needed for edema. Patient taking differently: Take 20 mg by mouth daily. 12/27/19 09/07/23  Martinique, Peter M, MD  gabapentin (NEURONTIN) 600 MG tablet Take 600 mg by mouth 4 (four) times daily.      [provider]  metFORMIN (GLUCOPHAGE-XR) 500 MG 24 hr tablet Take 500 mg by mouth every evening.    [provider]  methocarbamol (ROBAXIN) 500 MG tablet Take 500 mg by mouth daily as needed for muscle spasms. 09/05/22   [provider]  morphine (MS CONTIN) 30 MG 12 hr tablet Take 30 mg by mouth 4 (four) times daily.  12/23/16   [provider]  Multiple Vitamin (MULTIVITAMIN) capsule Take 1 capsule by mouth daily. Centrum silver    [provider]  oxyCODONE (ROXICODONE) 15 MG immediate release tablet Take 15 mg by mouth 4 (four) times daily.    [provider]  rosuvastatin (CRESTOR) 10 MG tablet TAKE 1 TABLET BY MOUTH EVERY DAY 10/23/22   Martinique, Peter M, MD      Allergies    Azithromycin, Cefuroxime, Cephalexin, Chlorhexidine, Prednisone, and Zithromax [azithromycin dihydrate]    Review of Systems   Review of Systems  Constitutional:  Negative for chills and fever.  HENT:  Negative for sore throat.   Eyes:  Negative for visual disturbance.  Respiratory:  Negative for cough and shortness of breath.   Cardiovascular:  Negative for chest pain.  Gastrointestinal:  Negative for abdominal pain, diarrhea and vomiting.  Genitourinary:  Negative for dysuria and flank pain.  Musculoskeletal:  Negative for back pain and neck pain.  Skin:  Negative for rash.  Neurological:  Negative for headaches.  Hematological:  Does not bruise/bleed easily.  Psychiatric/Behavioral:  Positive for confusion.     Physical Exam Updated Vital Signs BP (!) 166/90   Pulse 64   Temp (!) 97.5 F (36.4 C) (Oral)   Resp 17   SpO2 97%  Physical Exam Vitals and nursing note reviewed.  Constitutional:      Appearance: Normal appearance. She is well-developed.  HENT:     Head: Atraumatic.     Nose: Nose normal.     Mouth/Throat:     Mouth: Mucous membranes are moist.  Eyes:     General: No scleral icterus.    Conjunctiva/sclera: Conjunctivae normal.      Pupils: Pupils are equal, round, and reactive to light.  Neck:     Trachea: No tracheal deviation.     Comments: No stiffness or rigidity Cardiovascular:     Rate and Rhythm: Normal rate and regular rhythm.     Pulses: Normal pulses.     Heart sounds: Normal heart sounds. No murmur heard.    No friction rub. No gallop.  Pulmonary:     Effort: Pulmonary effort is normal. No respiratory distress.     Breath sounds: Normal breath sounds.  Abdominal:     General: Bowel sounds are normal. There is no distension.     Palpations: Abdomen is soft.     Tenderness: There is no abdominal tenderness. There is no guarding.  Genitourinary:    Comments: No cva tenderness.  Musculoskeletal:        General: No swelling.     Cervical back: Normal range of motion and neck supple. No rigidity. No muscular tenderness.     Comments: CTLS spine, non tender, aligned, no step off. Good rom bil extremities without pain or focal bony tenderness.   Skin:    General: Skin is warm and dry.     Findings: No rash.  Neurological:     Mental Status: She is alert.     Comments: Alert, speech normal. Motor/sens grossly intact bil.   Psychiatric:        Mood and Affect: Mood normal.     ED Results / Procedures / Treatments   Labs (all labs ordered are listed, but only abnormal results are displayed) Results for orders placed or performed during the hospital encounter of 10/31/22  Resp panel by RT-PCR (RSV, Flu A&B, Covid)   Specimen: Nasal Swab  Result Value Ref Range   SARS Coronavirus 2 by RT PCR NEGATIVE NEGATIVE   Influenza A by PCR NEGATIVE NEGATIVE   Influenza B by PCR NEGATIVE NEGATIVE   Resp Syncytial Virus by PCR NEGATIVE NEGATIVE  Comprehensive metabolic panel  Result Value Ref Range   Sodium 135 135 - 145 mmol/L   Potassium 2.6 (LL) 3.5 - 5.1 mmol/L   Chloride 97 (L) 98 - 111 mmol/L   CO2 25 22 - 32 mmol/L   Glucose, Bld 89 70 - 99 mg/dL   BUN 10 8 - 23 mg/dL   Creatinine, Ser 0.72 0.44  - 1.00 mg/dL   Calcium 9.1 8.9 - 10.3 mg/dL   Total Protein 7.9 6.5 - 8.1 g/dL   Albumin 4.0 3.5 - 5.0 g/dL   AST 24 15 - 41 U/L   ALT 16 0 - 44 U/L   Alkaline Phosphatase 73 38 - 126 U/L   Total Bilirubin 1.2 0.3 - 1.2 mg/dL   GFR, Estimated >60 >60 mL/min   Anion gap 13 5 - 15  Lactic acid, plasma  Result Value Ref Range   Lactic Acid, Venous 1.7 0.5 - 1.9 mmol/L  CBC with Differential  Result Value Ref Range   WBC 9.1 4.0 - 10.5 K/uL   RBC 4.89 3.87 - 5.11 MIL/uL   Hemoglobin 14.9 12.0 - 15.0 g/dL   HCT 42.9 36.0 - 46.0 %   MCV 87.7 80.0 - 100.0 fL   MCH 30.5 26.0 - 34.0 pg   MCHC 34.7 30.0 - 36.0 g/dL   RDW 13.8 11.5 - 15.5 %   Platelets 302 150 - 400 K/uL   nRBC 0.0 0.0 - 0.2 %   Neutrophils Relative % 72 %   Neutro Abs 6.6 1.7 - 7.7 K/uL   Lymphocytes Relative 21 %   Lymphs Abs 1.9 0.7 - 4.0 K/uL   Monocytes Relative 7 %   Monocytes Absolute 0.6 0.1 - 1.0 K/uL   Eosinophils Relative 0 %   Eosinophils Absolute 0.0 0.0 - 0.5 K/uL   Basophils Relative 0 %   Basophils Absolute 0.0 0.0 - 0.1 K/uL   Immature Granulocytes 0 %   Abs Immature Granulocytes 0.02 0.00 - 0.07 K/uL  Protime-INR  Result Value Ref Range   Prothrombin Time 12.8 11.4 - 15.2 seconds   INR 1.0 0.8 - 1.2  Urinalysis, Routine w reflex microscopic -Urine, Clean Catch  Result Value Ref Range   Color, Urine YELLOW YELLOW   APPearance CLEAR CLEAR   Specific Gravity, Urine 1.015 1.005 - 1.030   pH 7.0 5.0 - 8.0   Glucose, UA NEGATIVE NEGATIVE mg/dL   Hgb urine dipstick NEGATIVE NEGATIVE   Bilirubin Urine NEGATIVE NEGATIVE   Ketones, ur 20 (A) NEGATIVE mg/dL   Protein, ur 100 (A) NEGATIVE mg/dL   Nitrite NEGATIVE NEGATIVE   Leukocytes,Ua NEGATIVE NEGATIVE   RBC / HPF 0-5 0 - 5 RBC/hpf   WBC, UA 0-5 0 - 5 WBC/hpf   Bacteria, UA NONE SEEN NONE SEEN   Squamous Epithelial / HPF 0-5 0 - 5 /HPF   Mucus PRESENT   CK  Result Value Ref Range   Total CK 84 38 - 234 U/L  Magnesium  Result Value Ref  Range   Magnesium 2.0 1.7 - 2.4 mg/dL  Troponin I (High Sensitivity)  Result Value Ref Range   Troponin I (High Sensitivity) 18 (H) <18 ng/L   CT Head Wo Contrast  Result Date: 10/31/2022 CLINICAL DATA:  Mental status change, unknown cause. EXAM: CT HEAD WITHOUT CONTRAST TECHNIQUE: Contiguous axial images were obtained from the base of the skull through the vertex without intravenous contrast. RADIATION DOSE REDUCTION: This exam was performed according to the departmental dose-optimization program which includes automated exposure control, adjustment of the mA and/or kV according to patient size and/or use of iterative reconstruction technique. COMPARISON:  Head CT 09/10/2022.  MRI brain 09/06/2022. FINDINGS: Brain: No acute intracranial hemorrhage. 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 No hydrocephalus or extra-axial collection. No mass effect or midline shift. Vascular: No hyperdense vessel or unexpected calcification. Skull: No calvarial fracture or suspicious bone lesion. Skull base is unremarkable. Sinuses/Orbits: Unremarkable. Other: None. IMPRESSION: 1. No acute intracranial abnormality. 2. 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 Electronically Signed   By: Emmit Alexanders M.D.   On: 10/31/2022 13:52   DG Chest [facility]  Result Date: 10/31/2022 CLINICAL DATA:  Sepsis EXAM: PORTABLE CHEST 1 VIEW COMPARISON:  09/21/2022 x-ray FINDINGS: The left inferior costophrenic angle is clipped at the edge of the film. Film is rotated to the left as well. Stable cardiopericardial silhouette with calcified aorta. Surgical changes along the left upper lung. Surgical clips as well overlying the right axillary  region. The right lung is clear. What is seen of the left lung is clear. No edema. Overlapping cardiac leads. Osteopenia. IMPRESSION: Limited x-ray. Grossly clear lungs without pneumothorax or effusion. Surgical changes Repeat x-ray with standard technique as clinically appropriate Electronically Signed   By: Jill Side M.D.   On: 10/31/2022 12:44   CARDIAC EVENT MONITOR  Result Date: 10/07/2022   Normal sinus rhythm   No arrhythmia noted   No Afib.    EKG EKG Interpretation  Date/Time:  Friday October 31 2022 11:53:12 EST Ventricular Rate:  65 PR Interval:  132 QRS Duration: 122 QT Interval:  477 QTC Calculation: 496 R Axis:   46 Text Interpretation: Sinus rhythm Atrial premature complex Right bundle branch block Non-specific ST-t changes Confirmed by Lajean Saver [phone]) on 10/31/2022 12:36:46 PM  Radiology CT Head Wo Contrast  Result Date: 10/31/2022 CLINICAL DATA:  Mental status change, unknown cause. EXAM: CT HEAD WITHOUT CONTRAST TECHNIQUE: Contiguous axial images were obtained from the base of the skull through the vertex without intravenous contrast. RADIATION DOSE REDUCTION: This exam was performed according to the departmental dose-optimization program which includes automated exposure control, adjustment of the mA and/or kV according to patient size and/or use of iterative reconstruction technique. COMPARISON:  Head CT 09/10/2022.  MRI brain 09/06/2022. FINDINGS: Brain: No acute intracranial hemorrhage. 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 No hydrocephalus or extra-axial collection. No mass effect or midline shift. Vascular: No hyperdense vessel or unexpected calcification. Skull: No calvarial fracture or suspicious bone lesion. Skull base is unremarkable. Sinuses/Orbits: Unremarkable. Other: None. IMPRESSION: 1. No acute  intracranial abnormality. 2. 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 Electronically Signed   By: Emmit Alexanders M.D.   On: 10/31/2022 13:52   DG Chest Port 1 View  Result Date: 10/31/2022 CLINICAL DATA:  Sepsis EXAM: PORTABLE CHEST 1 VIEW COMPARISON:  09/21/2022 x-ray FINDINGS: The left inferior costophrenic angle is clipped at the edge of the film. Film is rotated to the left as well. Stable cardiopericardial silhouette with calcified aorta. Surgical changes along the left upper lung. Surgical clips as well overlying the right axillary region. The right lung is clear. What is seen of the left lung is clear. No edema. Overlapping cardiac leads. Osteopenia. IMPRESSION: Limited x-ray. Grossly clear lungs without pneumothorax or effusion. Surgical changes Repeat x-ray with standard technique as clinically appropriate Electronically Signed   By: Jill Side M.D.   On: 10/31/2022 12:44    Procedures Procedures    Medications Ordered in ED Medications  potassium chloride 10 mEq in 100 mL IVPB (10 mEq Intravenous New Bag/Given 10/31/22 1523)    ED Course/ Medical Decision Making/ A&P                             Medical Decision Making Problems Addressed: Acute alteration in mental status: acute illness or injury with systemic symptoms that poses  a threat to life or bodily functions Confusion: acute illness or injury with systemic symptoms that poses a threat to life or bodily functions Failure to thrive in adult: chronic illness or injury with exacerbation, progression, or side effects of treatment that poses a threat to life or bodily functions Generalized weakness: acute illness or injury with systemic symptoms that poses a threat to life or bodily functions Hypokalemia: acute illness or injury that poses a threat to life or bodily  functions Polypharmacy: chronic illness or injury that poses a threat to life or bodily functions  Amount and/or Complexity of Data Reviewed Independent Historian: EMS    Details: hx External Data Reviewed: notes. Labs: ordered. Decision-making details documented in ED Course. Radiology: ordered and independent interpretation performed. Decision-making details documented in ED Course. ECG/medicine tests: ordered and independent interpretation performed. Decision-making details documented in ED Course. Discussion of management or test interpretation with external provider(s): medicine  Risk Prescription drug management. Decision regarding hospitalization.   Iv ns. Continuous pulse ox and cardiac monitoring. Labs ordered/sent. Imaging ordered.   Differential diagnosis includes head injury, cva, anemia, dehydration, uti, etc . Dispo decision including potential need for admission considered - will get labs and imaging and reassess.   Reviewed nursing notes and prior charts for additional history. External reports reviewed. Additional history from: EMS.   Cardiac monitor: sinus rhythm, rate 66.  Labs reviewed/interpreted by me - k v low. Mg added - normal. Kcl iv.   Xrays reviewed/interpreted by me - no pna.   CT reviewed/interpreted by me - no hem.   Given altered ms/confusion (pt noted/found lying in her feces), weakness, low k, will plan to admit. Prior admits w similar presentation/?multifactorial encephalopathy noted, ?med related - will consult hospitalists for admission.   CRITICAL CARE RE: generalized weakness with altered mental status, severe hyperkalemia requiring iv replacement, dehydration/ivf.  Performed by: Mirna Mires Total critical care time: 40 minutes Critical care time was exclusive of separately billable procedures and treating other patients. Critical care was necessary to treat or prevent imminent or life-threatening deterioration. Critical care was time  spent personally by me on the following activities: development of treatment plan with patient and/or surrogate as well as nursing, discussions with consultants, evaluation of patient's response to treatment, examination of patient, obtaining history from patient or surrogate, ordering and performing treatments and interventions, ordering and review of laboratory studies, ordering and review of radiographic studies, pulse oximetry and re-evaluation of patient's condition.           Final Clinical Impression(s) / ED Diagnoses Final diagnoses:  None    Rx / DC Orders ED Discharge Orders     None         Lajean Saver, MD 10/31/22 1614

## 2022-10-31 NOTE — H&P (Signed)
History and Physical  [name] [id number] DOB: 1945/10/18 DOA: 10/31/2022   PCP: [name], FNP   Patient coming from: Home via EMS   Chief Complaint: Confusion   HPI: [name] is a 77 y.o. female with medical history significant for PE on room air, coronary artery disease, hypertension, hyperlipidemia on high doses of MS Contin, oxycodone and gabapentin due to chronic pain syndrome being admitted to the hospital with recurrent confusion.  History is taken from the medical record, and my conversation with the ER provider, as the patient is somewhat confused, not able to provide a history and I have been unable to contact family.  Patient is able to tell me that she lives with her husband, states that she has not been feeling unwell as far as she can recall.  Denies any recent changes in her medications.  Currently denies any pain, fevers, nausea, vomiting or any other complaints.  Apparently the patient was last known well about 5 days ago, today the patient was noted to be quite confused, covered in her own feces.  At baseline apparently the patient is alert and oriented x 4.  Evaluation in the emergency department reveals that the patient is afebrile, vital signs are stable other than elevated blood pressure of 151/83.  Lab work is unremarkable except for potassium of 2.6, magnesium is normal at 2.0.  Chest x-ray and urinalysis are also unimpressive.  Review of Systems: Please see HPI for pertinent positives and negatives. A complete 10 system review of systems are otherwise negative.  Past Medical History:  Diagnosis Date   Asthma    Breast cancer ([place])    right breast   COPD (chronic obstructive pulmonary disease) (HCC)    Coronary artery disease    Diverticulum of esophagus    Elevated LFTs    Emphysema lung (HCC)    ETOH abuse    H/O atrial fibrillation without current medication    only one time when she had sepsis   Hyperlipidemia    Hypertension    hx of  but not on any medications   Myocardial infarction ([place]) 2000   OA (osteoarthritis) of knee    Osteoarthritis    Tobacco abuse    Past Surgical History:  Procedure Laterality Date   ANTERIOR HIP REVISION Right 10/22/2015   Procedure: RIGHT  HIP REVISION;  Surgeon: Paralee Cancel, MD;  Location: [facility];  Service: Orthopedics;  Laterality: Right;   APPLICATION OF WOUND VAC N/A 06/20/2015   Procedure: APPLICATION OF INCISIONAL WOUND VAC;  Surgeon: Melina Schools, MD;  Location: [place];  Service: Orthopedics;  Laterality: N/A;   BREAST SURGERY  1991   right mastectomy   CARDIAC CATHETERIZATION  04/05/2009   EF 60%   CARDIOVASCULAR STRESS TEST  01/31/2005   EF 58%   CESAREAN SECTION  '78, '80, '81   x 3   CORONARY ANGIOPLASTY  08/1998   x2 OF A BIFURCATION OM-1, OM-2 LESION   CORONARY ANGIOPLASTY WITH STENT PLACEMENT  01/1999   MID FIRST OBTUSE MARGINAL VESSEL   CORONARY ANGIOPLASTY WITH STENT PLACEMENT  07/1999   STENTING AT THE CRUX OF THE RIGHT CORONARY ARTERY WITH A 3.8MM X 18MM TETRA STENT   DIRECT LARYNGOSCOPY N/A 05/03/2015   Procedure: DIRECT LARYNGOSCOPY;  Surgeon: Jodi Marble, MD;  Location: [place];  Service: ENT;  Laterality: N/A;   EYE SURGERY  05/18/2014,06/01/2014   BILATERAL CATARACT S WITH LENS IMPLANTS   GASTROSTOMY N/A 05/04/2015  Procedure: OPEN GASTROSTOMY WITH TUBE PLACEMENT;  Surgeon: Donnie Mesa, MD;  Location: [place];  Service: General;  Laterality: N/A;   GASTROSTOMY N/A 11/13/2016   Procedure: INSERTION OF GASTROSTOMY TUBE;  Surgeon: Coralie Keens, MD;  Location: [place];  Service: General;  Laterality: N/A;   HARDWARE REMOVAL N/A 05/03/2015   Procedure: HARDWARE REMOVAL;  Surgeon: Melina Schools, MD;  Location: [place];  Service: Orthopedics;  Laterality: N/A;   HIP CLOSED REDUCTION Right 04/26/2015   Procedure: CLOSED REDUCTION HIP;  Surgeon: Melina Schools, MD;  Location[facility];  Service: Orthopedics;  Laterality: Right;   HYSTEROSCOPY     D & C   INCISION AND DRAINAGE  ABSCESS N/A 05/03/2015   Procedure: INCISION AND DRAINAGE CERVICAL  ABSCESS AND REMOVAL OF HARDWARE;  Surgeon: Melina Schools, MD;  Location: [place];  Service: Orthopedics;  Laterality: N/A;   IR CM INJ ANY COLONIC TUBE W/FLUORO  10/14/2017   IR CM INJ ANY COLONIC TUBE W/FLUORO  10/23/2017   IR CM INJ ANY COLONIC TUBE W/FLUORO  10/28/2017   IR REPLC GASTRO/COLONIC TUBE PERCUT W/FLUORO  09/22/2017   JOINT REPLACEMENT  08/2011   bilateral hip   JOINT REPLACEMENT  01/2012   right hip   MASTECTOMY     neck fusion  2011   ORIF FEMUR FRACTURE Right 06/14/2019   Procedure: ORIF PERI PROSTHETIC FEMUR FRACTURE;  Surgeon: Paralee Cancel, MD;  Location: [place];  Service: Orthopedics;  Laterality: Right;   PELVIC LAPAROSCOPY  2002   RSO-     RADICAL NECK DISSECTION N/A 11/08/2016   Procedure: INCISION AND DRAINAGE OF NECK ABSCESS;  Surgeon: Jerrell Belfast, MD;  Location: [place];  Service: ENT;  Laterality: N/A;   RADIOLOGY WITH ANESTHESIA Right 06/28/2015   Procedure: MRI OF CERVICAL SPINE  AND RIGHT HIP  WITH AND WITHOUT CONTRAST    (RADIOLOGY WITH ANESTHESIA);  Surgeon: Medication Radiologist, MD;  Location: [place];  Service: Radiology;  Laterality: Right;   REMOVAL OF GASTROSTOMY TUBE N/A 11/14/2016   Procedure: REMOVAL OF GASTROSTOMY TUBE W/ REPLACEMENT OF GASTROSTOMY TUBE;  Surgeon: Coralie Keens, MD;  Location: [place];  Service: General;  Laterality: N/A;   RIGID ESOPHAGOSCOPY N/A 05/03/2015   Procedure: RIGID ESOPHAGOSCOPY;  Surgeon: Jodi Marble, MD;  Location: [place];  Service: ENT;  Laterality: N/A;   TONSILLECTOMY AND ADENOIDECTOMY     TOTAL HIP ARTHROPLASTY  08/2010   bilat   VULVECTOMY  1981   partial    Social History:  reports that she has been smoking cigarettes. She has been smoking an average of 1 pack per day. She has never used smokeless tobacco. She reports that she does not drink alcohol and does not use drugs.   Allergies  Allergen Reactions   Azithromycin Other (See Comments)    Cefuroxime Other (See Comments)   Cephalexin Other (See Comments)    Took off first layer of skin inside of mouth   Chlorhexidine    Prednisone    Zithromax [Azithromycin Dihydrate] Other (See Comments)    ORAL ULCERS     Family History  Problem Relation Age of Onset   Diabetes Mother    Hypertension Father    Heart disease Father    Heart attack Father    Stroke Father      Prior to Admission medications   Medication Sig Start Date End Date Taking? Authorizing Provider  albuterol (PROVENTIL HFA;VENTOLIN HFA) 108 (90 BASE) MCG/ACT inhaler Inhale 2 puffs into the lungs  every 6 (six) hours as needed for wheezing.     [provider]  ascorbic acid (VITAMIN C) 500 MG tablet Take 500 mg by mouth daily.    [provider]  BIOTIN EXTRA STRENGTH PO Take 500 mg by mouth daily.    [provider]  cholecalciferol (VITAMIN D3) 25 MCG (1000 UNIT) tablet Take 1,000 Units by mouth daily.    [provider]  clopidogrel (PLAVIX) 75 MG tablet Take 1 tablet (75 mg total) by mouth daily. 09/08/22 11/07/22  Darliss Cheney, MD  furosemide (LASIX) 20 MG tablet Take 1 tablet (20 mg total) by mouth daily as needed for edema. Patient taking differently: Take 20 mg by mouth daily. 12/27/19 09/07/23  Martinique, Peter M, MD  gabapentin (NEURONTIN) 600 MG tablet Take 600 mg by mouth 4 (four) times daily.     [provider]  metFORMIN (GLUCOPHAGE-XR) 500 MG 24 hr tablet Take 500 mg by mouth every evening.    [provider]  methocarbamol (ROBAXIN) 500 MG tablet Take 500 mg by mouth daily as needed for muscle spasms. 09/05/22   [provider]  morphine (MS CONTIN) 30 MG 12 hr tablet Take 30 mg by mouth 4 (four) times daily.  12/23/16   [provider]  Multiple Vitamin (MULTIVITAMIN) capsule Take 1 capsule by mouth daily. Centrum silver    [provider]  oxyCODONE (ROXICODONE) 15 MG immediate release tablet Take 15 mg by mouth 4  (four) times daily.    [provider]  rosuvastatin (CRESTOR) 10 MG tablet TAKE 1 TABLET BY MOUTH EVERY DAY 10/23/22   Martinique, Peter M, MD    Physical Exam: BP (!) 174/89   Pulse 72   Temp (!) 97.5 F (36.4 C) (Oral)   Resp 14   SpO2 98%   General:  Alert, oriented, calm, in no acute distress  Eyes: EOMI, clear conjuctivae, white sclerea Neck: supple, no masses, trachea mildline  Cardiovascular: RRR, no murmurs or rubs, no peripheral edema  Respiratory: clear to auscultation bilaterally, no wheezes, no crackles  Abdomen: soft, nontender, nondistended, normal bowel tones heard  Skin: dry, no rashes  Musculoskeletal: no joint effusions, normal range of motion  Psychiatric: appropriate affect, normal speech  Neurologic: extraocular muscles intact, clear speech, moving all extremities with intact sensorium          Labs on Admission:  Basic Metabolic Panel: Recent Labs  Lab 10/31/22 1304  NA 135  K 2.6*  CL 97*  CO2 25  GLUCOSE 89  BUN 10  CREATININE 0.72  CALCIUM 9.1  MG 2.0   Liver Function Tests: Recent Labs  Lab 10/31/22 1304  AST 24  ALT 16  ALKPHOS 73  BILITOT 1.2  PROT 7.9  ALBUMIN 4.0   No results for input(s): "LIPASE", "AMYLASE" in the last 168 hours. No results for input(s): "AMMONIA" in the last 168 hours. CBC: Recent Labs  Lab 10/31/22 1304  WBC 9.1  NEUTROABS 6.6  HGB 14.9  HCT 42.9  MCV 87.7  PLT 302   Cardiac Enzymes: Recent Labs  Lab 10/31/22 1304  CKTOTAL 84    BNP (last 3 results) No results for input(s): "BNP" in the last 8760 hours.  ProBNP (last 3 results) No results for input(s): "PROBNP" in the last 8760 hours.  CBG: No results for input(s): "GLUCAP" in the last 168 hours.  Radiological Exams on Admission: CT Head Wo Contrast  Result Date: 10/31/2022 CLINICAL DATA:  Mental status change,  unknown cause. EXAM: CT HEAD WITHOUT CONTRAST TECHNIQUE: Contiguous axial images were obtained from the base of the skull  through the vertex without intravenous contrast. RADIATION DOSE REDUCTION: This exam was performed according to the departmental dose-optimization program which includes automated exposure control, adjustment of the mA and/or kV according to patient size and/or use of iterative reconstruction technique. COMPARISON:  Head CT 09/10/2022.  MRI brain 09/06/2022. FINDINGS: Brain: No acute intracranial hemorrhage. 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 No hydrocephalus or extra-axial collection. No mass effect or midline shift. Vascular: No hyperdense vessel or unexpected calcification. Skull: No calvarial fracture or suspicious bone lesion. Skull base is unremarkable. Sinuses/Orbits: Unremarkable. Other: None. IMPRESSION: 1. No acute intracranial abnormality. 2. 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 Electronically Signed   By: Emmit Alexanders M.D.   On: 10/31/2022 13:52   DG Chest Port 1 View  Result Date: 10/31/2022 CLINICAL DATA:  Sepsis EXAM: PORTABLE CHEST 1 VIEW COMPARISON:  09/21/2022 x-ray FINDINGS: The left inferior costophrenic angle is clipped at the edge of the film. Film is rotated to the left as well. Stable cardiopericardial silhouette with calcified aorta. Surgical changes along the left upper lung. Surgical clips as well overlying the right axillary region. The right lung is clear. What is seen of the left lung is clear. No edema. Overlapping cardiac leads. Osteopenia. IMPRESSION: Limited x-ray. Grossly clear lungs without pneumothorax or effusion. Surgical changes Repeat x-ray with standard technique as clinically appropriate Electronically Signed   By: Jill Side M.D.   On: 10/31/2022 12:44     Assessment/Plan Principal Problem:   Acute metabolic encephalopathy - likely due to combination of electrolyte derangement (hypoKalemia) and polypharmacy as the patient is on very high dose narcotics and other sedating medications. - observation admission - replete K orally and via IV route - note normal Mg level - resume chronic home medications,  with Neurontin, Oxy IR and Morphine resumed at  reduced dose - check Ammonia level and ABG Active Problems:   Chronic pain   Hyperlipidemia - home Crestor   Essential hypertension -no BP meds on home medication list; blood pressures currently uncontrolled, will add as needed IV hydralazine for now   Type 2 diabetes mellitus with hyperglycemia (HCC) - DM diet when eating, with SSI    CAD (coronary artery disease) -continue Plavix  DVT prophylaxis: Lovenox   Code Status: Full   Family Communication: None present. Called husband on home phone and mobile, no answer and voicemail has not been set up.  Consults called: None   Admission status: Observation   Time spent: 38 minutes  Braylynn Ghan Neva Seat MD [facility] Pager [phone]  If 7PM-7AM, please contact night-coverage [email or web address] Password [facility]  10/31/2022, 4:56 PM

## 2022-10-31 NOTE — ED Triage Notes (Signed)
BIBA  Per EMS: pt coming from home w/ confusion. LKW Sunday. Pt covered in her own feces. Normally A&Ox4.  18G LAC  250 mL given en route  60 HR  106 CBG  165/71 BP  100 temp

## 2022-11-01 DIAGNOSIS — Z1152 Encounter for screening for COVID-19: Secondary | ICD-10-CM | POA: Diagnosis not present

## 2022-11-01 DIAGNOSIS — Z9011 Acquired absence of right breast and nipple: Secondary | ICD-10-CM | POA: Diagnosis not present

## 2022-11-01 DIAGNOSIS — I2510 Atherosclerotic heart disease of native coronary artery without angina pectoris: Secondary | ICD-10-CM | POA: Diagnosis present

## 2022-11-01 DIAGNOSIS — Z853 Personal history of malignant neoplasm of breast: Secondary | ICD-10-CM | POA: Diagnosis not present

## 2022-11-01 DIAGNOSIS — E1165 Type 2 diabetes mellitus with hyperglycemia: Secondary | ICD-10-CM | POA: Diagnosis present

## 2022-11-01 DIAGNOSIS — I4891 Unspecified atrial fibrillation: Secondary | ICD-10-CM | POA: Diagnosis present

## 2022-11-01 DIAGNOSIS — E876 Hypokalemia: Secondary | ICD-10-CM | POA: Diagnosis present

## 2022-11-01 DIAGNOSIS — M1710 Unilateral primary osteoarthritis, unspecified knee: Secondary | ICD-10-CM | POA: Diagnosis present

## 2022-11-01 DIAGNOSIS — E119 Type 2 diabetes mellitus without complications: Secondary | ICD-10-CM | POA: Diagnosis present

## 2022-11-01 DIAGNOSIS — G894 Chronic pain syndrome: Secondary | ICD-10-CM | POA: Diagnosis present

## 2022-11-01 DIAGNOSIS — Z8249 Family history of ischemic heart disease and other diseases of the circulatory system: Secondary | ICD-10-CM | POA: Diagnosis not present

## 2022-11-01 DIAGNOSIS — I10 Essential (primary) hypertension: Secondary | ICD-10-CM | POA: Diagnosis present

## 2022-11-01 DIAGNOSIS — J4489 Other specified chronic obstructive pulmonary disease: Secondary | ICD-10-CM | POA: Diagnosis present

## 2022-11-01 DIAGNOSIS — J439 Emphysema, unspecified: Secondary | ICD-10-CM | POA: Diagnosis present

## 2022-11-01 DIAGNOSIS — G9341 Metabolic encephalopathy: Secondary | ICD-10-CM | POA: Diagnosis present

## 2022-11-01 DIAGNOSIS — Z823 Family history of stroke: Secondary | ICD-10-CM | POA: Diagnosis not present

## 2022-11-01 DIAGNOSIS — I700 Atherosclerosis of aorta: Secondary | ICD-10-CM | POA: Diagnosis present

## 2022-11-01 DIAGNOSIS — E785 Hyperlipidemia, unspecified: Secondary | ICD-10-CM | POA: Diagnosis present

## 2022-11-01 DIAGNOSIS — Z8673 Personal history of transient ischemic attack (TIA), and cerebral infarction without residual deficits: Secondary | ICD-10-CM | POA: Diagnosis not present

## 2022-11-01 DIAGNOSIS — Z833 Family history of diabetes mellitus: Secondary | ICD-10-CM | POA: Diagnosis not present

## 2022-11-01 DIAGNOSIS — I252 Old myocardial infarction: Secondary | ICD-10-CM | POA: Diagnosis not present

## 2022-11-01 DIAGNOSIS — Z881 Allergy status to other antibiotic agents status: Secondary | ICD-10-CM | POA: Diagnosis not present

## 2022-11-01 DIAGNOSIS — F17210 Nicotine dependence, cigarettes, uncomplicated: Secondary | ICD-10-CM | POA: Diagnosis present

## 2022-11-01 DIAGNOSIS — Z79899 Other long term (current) drug therapy: Secondary | ICD-10-CM | POA: Diagnosis not present

## 2022-11-01 LAB — CBC
HCT: 35.8 % — ABNORMAL LOW (ref 36.0–46.0)
Hemoglobin: 12.5 g/dL (ref 12.0–15.0)
MCH: 31 pg (ref 26.0–34.0)
MCHC: 34.9 g/dL (ref 30.0–36.0)
MCV: 88.8 fL (ref 80.0–100.0)
Platelets: 220 K/uL (ref 150–400)
RBC: 4.03 MIL/uL (ref 3.87–5.11)
RDW: 13.9 % (ref 11.5–15.5)
WBC: 8.9 K/uL (ref 4.0–10.5)
nRBC: 0 % (ref 0.0–0.2)

## 2022-11-01 LAB — GLUCOSE, CAPILLARY
Glucose-Capillary: 80 mg/dL (ref 70–99)
Glucose-Capillary: 102 mg/dL — ABNORMAL HIGH (ref 70–99)
Glucose-Capillary: 100 mg/dL — ABNORMAL HIGH (ref 70–99)
Glucose-Capillary: 130 mg/dL — ABNORMAL HIGH (ref 70–99)
Glucose-Capillary: 93 mg/dL (ref 70–99)

## 2022-11-01 LAB — BASIC METABOLIC PANEL WITH GFR
Anion gap: 10 (ref 5–15)
BUN: 9 mg/dL (ref 8–23)
CO2: 21 mmol/L — ABNORMAL LOW (ref 22–32)
Calcium: 8.3 mg/dL — ABNORMAL LOW (ref 8.9–10.3)
Chloride: 105 mmol/L (ref 98–111)
Creatinine, Ser: 0.58 mg/dL (ref 0.44–1.00)
GFR, Estimated: >60 mL/min
Glucose, Bld: 69 mg/dL — ABNORMAL LOW (ref 70–99)
Potassium: 4 mmol/L (ref 3.5–5.1)
Sodium: 136 mmol/L (ref 135–145)

## 2022-11-01 LAB — VITAMIN B12: Vitamin B-12: 1126 pg/mL — ABNORMAL HIGH (ref 180–914)

## 2022-11-01 LAB — HIV ANTIBODY (ROUTINE TESTING W REFLEX): HIV Screen 4th Generation wRfx: NONREACTIVE

## 2022-11-01 LAB — TSH: TSH: 2.846 u[IU]/mL (ref 0.350–4.500)

## 2022-11-01 NOTE — Evaluation (Signed)
Physical Therapy Evaluation Patient Details Name: [name] MRN: [id number] DOB: March 29, 1946 Today's Date: 11/01/2022  History of Present Illness  Pt admitted from home 2* acute metabolic encehpalopathy and with hx of CAD, COPD, breast CA s/p mastectomy, MI, Bil THR, PE, DM, Chronic pain syndrome and ETHO abuse  Clinical Impression  Pt admitted as above and presenting with functional mobility limitations 2* generalized weakness, ambulatory balance deficits and questionable safety awareness.  Pt hopes to progress to dc home with family assist.     Recommendations for follow up therapy are one component of a multi-disciplinary discharge planning process, led by the attending physician.  Recommendations may be updated based on patient status, additional functional criteria and insurance authorization.  Follow Up Recommendations No PT follow up      Assistance Recommended at Discharge Frequent or constant Supervision/Assistance  Patient can return home with the following  A little help with walking and/or transfers;A little help with bathing/dressing/bathroom;Assistance with cooking/housework;Assist for transportation;Help with stairs or ramp for entrance    Equipment Recommendations None recommended by PT  Recommendations for Other Services       Functional Status Assessment Patient has had a recent decline in their functional status and demonstrates the ability to make significant improvements in function in a reasonable and predictable amount of time.     Precautions / Restrictions Precautions Precautions: Fall Restrictions Weight Bearing Restrictions: No      Mobility  Bed Mobility Overal bed mobility: Needs Assistance Bed Mobility: Supine to Sit, Sit to Supine     Supine to sit: Min assist Sit to supine: Supervision   General bed mobility comments: min assist to bring trunk to upright    Transfers Overall transfer level: Needs assistance Equipment used: Rolling  walker (2 wheels) Transfers: Sit to/from Stand Sit to Stand: Min guard           General transfer comment: Steady assist    Ambulation/Gait Ambulation/Gait assistance: Min assist, Min guard Gait Distance (Feet): 100 Feet Assistive device: Rolling walker (2 wheels) Gait Pattern/deviations: Step-through pattern, Decreased step length - right, Decreased step length - left, Shuffle, Trunk flexed       General Gait Details: Steady assist with cues for posture and position from [employer]            Wheelchair Mobility    Modified Rankin (Stroke Patients Only)       Balance Overall balance assessment: Needs assistance Sitting-balance support: No upper extremity supported, Feet supported Sitting balance-Leahy Scale: Good     Standing balance support: No upper extremity supported Standing balance-Leahy Scale: Fair                               Pertinent Vitals/Pain Pain Assessment Pain Assessment: No/denies pain    Home Living Family/patient expects to be discharged to:: Private residence Living Arrangements: Spouse/significant other Available Help at Discharge: Family;Available 24 hours/day Type of Home: House Home Access: Level entry       Home Layout: Multi-level;Able to live on main level with bedroom/bathroom Home Equipment: Rollator (4 wheels)      Prior Function Prior Level of Function : Independent/Modified Independent             Mobility Comments: using rollator PRN, spouse/pt share IADls. Drives. Son assists as well as  needed. helps with grocery shopping. Son works ADLs Comments: independent, no falls reported     Hand Dominance  Dominant Hand: Right    Extremity/Trunk Assessment   Upper Extremity Assessment Upper Extremity Assessment: Defer to OT evaluation    Lower Extremity Assessment Lower Extremity Assessment: Generalized weakness    Cervical / Trunk Assessment Cervical / Trunk Assessment: Kyphotic   Communication   Communication: No difficulties  Cognition Arousal/Alertness: Awake/alert Behavior During Therapy: Impulsive, Flat affect Overall Cognitive Status: No family/caregiver present to determine baseline cognitive functioning                                 General Comments: Pt with hx of multiple hospital admits 2* confusion        General Comments      Exercises     Assessment/Plan    PT Assessment Patient needs continued PT services  PT Problem List Decreased strength;Decreased activity tolerance;Decreased balance;Decreased mobility;Decreased knowledge of use of DME;Decreased cognition       PT Treatment Interventions DME instruction;Gait training;Stair training;Functional mobility training;Therapeutic activities;Therapeutic exercise;Patient/family education;Cognitive remediation    PT Goals (Current goals can be found in the Care Plan section)  Acute Rehab PT Goals Patient Stated Goal: Home PT Goal Formulation: With patient Time For Goal Achievement: 11/08/22 Potential to Achieve Goals: Good    Frequency Min 3X/week     Co-evaluation               AM-PAC PT "6 Clicks" Mobility  Outcome Measure Help needed turning from your back to your side while in a flat bed without using bedrails?: None Help needed moving from lying on your back to sitting on the side of a flat bed without using bedrails?: A Little Help needed moving to and from a bed to a chair (including a wheelchair)?: A Little Help needed standing up from a chair using your arms (e.g., wheelchair or bedside chair)?: A Little Help needed to walk in hospital room?: A Little Help needed climbing 3-5 steps with a railing? : A Little 6 Click Score: 19    End of Session Equipment Utilized During Treatment: Gait belt Activity Tolerance: Patient tolerated treatment well;Patient limited by fatigue Patient left: in bed;with call bell/phone within reach;with bed alarm set;with  nursing/sitter in room Nurse Communication: Mobility status PT Visit Diagnosis: Unsteadiness on feet (R26.81);Muscle weakness (generalized) (M62.81)    Time: [id number] PT Time Calculation (min) (ACUTE ONLY): 16 min   Charges:   PT Evaluation $PT Eval Low Complexity: 1 Low          [place] Pager [phone] Office [phone]   Jeffren Dombek 11/01/2022, 2:46 PM

## 2022-11-01 NOTE — Evaluation (Signed)
Occupational Therapy Evaluation Patient Details Name: [name] MRN: [id number] DOB: 1945-12-08 Today's Date: 11/01/2022   History of Present Illness Patient is a 77 year old female who was admitted from home 2* acute metabolic encehpalopathy and with hx of CAD, COPD, breast CA s/p mastectomy, MI, Bil THR, PE, DM, Chronic pain syndrome and ETHO abuse   Clinical Impression   Patient  is a 77 year old female who was admitted for above. Patient reported living at home with husband prior level. Currently, patient is min guard for ADL tasks with impulsive movements noted during session. Patient has poor safety awareness, decreased standing balance, decreased sitting balance, decreased functional activity tolerance impacting participation in ADLs. Patient reported plan was to transition back home at time of d/c with family. Patient would continue to benefit from skilled OT services at this time while admitted  to address noted deficits in order to improve overall safety and independence in ADLs.       Recommendations for follow up therapy are one component of a multi-disciplinary discharge planning process, led by the attending physician.  Recommendations may be updated based on patient status, additional functional criteria and insurance authorization.   Follow Up Recommendations  No OT follow up     Assistance Recommended at Discharge Frequent or constant Supervision/Assistance  Patient can return home with the following A little help with walking and/or transfers;A little help with bathing/dressing/bathroom;Assistance with cooking/housework;Direct supervision/assist for medications management;Assist for transportation;Help with stairs or ramp for entrance;Direct supervision/assist for financial management    Functional Status Assessment  Patient has had a recent decline in their functional status and demonstrates the ability to make significant improvements in function in a reasonable and  predictable amount of time.  Equipment Recommendations  None recommended by OT       Precautions / Restrictions Precautions Precautions: Fall Precaution Comments: kyphotic Restrictions Weight Bearing Restrictions: No      Mobility Bed Mobility Overal bed mobility: Needs Assistance Bed Mobility: Supine to Sit, Sit to Supine     Supine to sit: Min assist Sit to supine: Supervision   General bed mobility comments: min assist to bring trunk to upright    Transfers Overall transfer level: Needs assistance Equipment used: Rolling walker (2 wheels) Transfers: Sit to/from Stand Sit to Stand: Min guard           General transfer comment: Steady assist      Balance Overall balance assessment: Needs assistance Sitting-balance support: No upper extremity supported, Feet supported Sitting balance-Leahy Scale: Good     Standing balance support: No upper extremity supported Standing balance-Leahy Scale: Fair           ADL either performed or assessed with clinical judgement   ADL Overall ADL's : Needs assistance/impaired Eating/Feeding: Set up;Sitting   Grooming: Supervision/safety;Sitting   Upper Body Bathing: Minimal assistance;Sitting   Lower Body Bathing: Moderate assistance;Sit to/from stand Lower Body Bathing Details (indicate cue type and reason): impulsive able to reach BLE in bed reaching while in long sitting positioning with lateral lean to R side with kyphotic posture. Upper Body Dressing : Min guard;Sitting   Lower Body Dressing: Moderate assistance;Sitting/lateral leans Lower Body Dressing Details (indicate cue type and reason): patient was able to don/doff bilateral socks long sitting in bed with therapist blocking patients head from leaning over edge of bed with leaning to R noted. had to take bedrail down with no awareness of how close head was to bumping Toilet Transfer: Minimal assistance;Ambulation;Rolling walker (  2 wheels) Toilet Transfer  Details (indicate cue type and reason): with patient needing 1x A with patient impulsive and moving quick. Toileting- Clothing Manipulation and Hygiene: Maximal assistance;Sit to/from stand               Vision   Vision Assessment?: No apparent visual deficits            Pertinent Vitals/Pain Pain Assessment Pain Assessment: No/denies pain     Hand Dominance Right   Extremity/Trunk Assessment Upper Extremity Assessment Upper Extremity Assessment: Overall WFL for tasks assessed   Lower Extremity Assessment Lower Extremity Assessment: Defer to PT evaluation   Cervical / Trunk Assessment Cervical / Trunk Assessment: Kyphotic   Communication Communication Communication: No difficulties   Cognition Arousal/Alertness: Awake/alert Behavior During Therapy: Impulsive, Flat affect Overall Cognitive Status: No family/caregiver present to determine baseline cognitive functioning           General Comments: patient reported " i am here" when asked orientation questions. she also reported " i dont care" when asked what the date was.                Home Living Family/patient expects to be discharged to:: Private residence Living Arrangements: Spouse/significant other Available Help at Discharge: Family;Available 24 hours/day Type of Home: House Home Access: Level entry     Home Layout: Multi-level;Able to live on main level with bedroom/bathroom     Bathroom Shower/Tub: Teacher, early years/pre: Standard Bathroom Accessibility: Yes   Home Equipment: Rollator (4 wheels)          Prior Functioning/Environment Prior Level of Function : Independent/Modified Independent             Mobility Comments: using rollator PRN, spouse/pt share IADls. Drives. Son assists as well as  needed. helps with grocery shopping. Son works ADLs Comments: independent, no falls reported        OT Problem List: Decreased activity tolerance;Impaired balance (sitting  and/or standing);Decreased cognition;Decreased safety awareness      OT Treatment/Interventions: Self-care/ADL training;Therapeutic activities;Patient/family education;Balance training    OT Goals(Current goals can be found in the care plan section) Acute Rehab OT Goals Patient Stated Goal: none stated OT Goal Formulation: Patient unable to participate in goal setting Time For Goal Achievement: 11/15/22 Potential to Achieve Goals: Fair  OT Frequency: Min 1X/week    Co-evaluation PT/OT/SLP Co-Evaluation/Treatment: Yes Reason for Co-Treatment: Necessary to address cognition/behavior during functional activity          AM-PAC OT "6 Clicks" Daily Activity     Outcome Measure Help from another person eating meals?: None Help from another person taking care of personal grooming?: A Little Help from another person toileting, which includes using toliet, bedpan, or urinal?: A Lot Help from another person bathing (including washing, rinsing, drying)?: A Lot Help from another person to put on and taking off regular upper body clothing?: A Little Help from another person to put on and taking off regular lower body clothing?: A Lot 6 Click Score: 16   End of Session Equipment Utilized During Treatment: Gait belt;Rolling walker (2 wheels) Nurse Communication: Other (comment) (ok to participate, in room at end of session)  Activity Tolerance: Patient tolerated treatment well Patient left: in bed;with call bell/phone within reach;with bed alarm set;with nursing/sitter in room  OT Visit Diagnosis: Unsteadiness on feet (R26.81);Other abnormalities of gait and mobility (R26.89);Muscle weakness (generalized) (M62.81)                Time:  [id number] OT Time Calculation (min): 13 min Charges:  OT General Charges $OT Visit: 1 Visit OT Evaluation $OT Eval Low Complexity: 1 Low  Deaaron Fulghum OTR/L, MS Acute Rehabilitation Department Office# [phone]   Willa Rough 11/01/2022, 4:15 PM

## 2022-11-01 NOTE — Progress Notes (Addendum)
PROGRESS NOTE    [name]  [id number] DOB: 1946/02/01 DOA: 10/31/2022 PCP: [name], FNP   Brief Narrative:   [name] is a 77 y.o. female with medical history significant for PE on room air, coronary artery disease, hypertension, hyperlipidemia on high doses of MS Contin, oxycodone and gabapentin due to chronic pain syndrome being admitted to the hospital with recurrent confusion. patient was noted to be quite confused, covered in her own feces. At baseline apparently the patient is alert and oriented x 4. Evaluation in the emergency department reveals that the patient is afebrile, vital signs are stable other than elevated blood pressure of 151/83. Lab work is unremarkable except for potassium of 2.6, magnesium is normal at 2.0. Chest x-ray and urinalysis are also unimpressive.  Admitted for further evaluation and management of acute metabolic encephalopathy.  Assessment & Plan:   Acute metabolic encephalopathy: -Multifactorial in the setting of electrolyte derangement as well as polypharmacy since patient has been on high-dose of narcotics at home -Workup including CT head negative for any acute findings.  Chest x-ray negative for infection.  UA unremarkable.  Afebrile with no leukocytosis.  Lactic acid: WNL.  COVID, flu, RSV all resulted negative.  Ammonia: WNL. -Replenish electrolytes.  Check TSH, B12, RPR -PT/OT evaluation -Fall precautions/delirium precautions -Avoid high-dose narcotics  Poor p.o. intake: -Patient is family is concerned about poor p.o. intake for the past couple of days -Consulted dietitian  Hypokalemia: Replenished  Hypertension: Not on any medications at home per chart -BP elevated upon arrival.  Currently on hydralazine as needed  Type II diabetes: Last A1c 6.2%.  Continue sliding scale insulin -Monitor blood sugar closely  Hyperlipidemia: Continue Crestor  History of CVA: Continue Plavix and Crestor  Chronic pain: -Followed by  pain management outpatient -On gabapentin 600 mg 4 times a day, oxycodone 15 mg 4 times a day and MS Contin 30 mg 4 times a day at home -Resume on lower dose gabapentin 300 3 times daily, oxycodone 10 mg 4 times daily and MS Contin 15 mg 4 times a day -She needs to follow-up with PCP and pain management outpatient to discuss tapering off some of her pain medicines  Tobacco abuse: Counseled about cessation  DVT prophylaxis: Lovenox Code Status: Full code Family Communication:  None present at bedside.  Plan of care discussed with patient in length and he verbalized understanding and agreed with it.  1/17: I called patient's daughter and discussed plan of care.  I answered all of their questions.  Disposition Plan: To be determined  Consultants:  None  Procedures:  None  Antimicrobials:  None  Status is: Observation    Subjective: Patient seen and examined.  Alert but confused.  Oriented to place only, not oriented to time and person.  Denies any complaints.  No nausea, vomiting, diarrhea, abdominal pain, fever, chills.  Objective: Vitals:   11/01/22 0107 11/01/22 0346 11/01/22 0801 11/01/22 1128  BP: (!) 165/78 (!) 141/75 (!) 152/72 (!) 149/78  Pulse: 69 69 70 70  Resp: (!) 21 17 16 14  $ Temp: 98.8 F (37.1 C) 98.8 F (37.1 C) 97.8 F (36.6 C) 97.9 F (36.6 C)  TempSrc: Oral Axillary Axillary Oral  SpO2: 99% 97% 95% 96%    Intake/Output Summary (Last 24 hours) at 11/01/2022 1315 Last data filed at 11/01/2022 1135 Gross per 24 hour  Intake 240 ml  Output 400 ml  Net -160 ml   There were no vitals filed for this  visit.  Examination:  General exam: Appears calm and comfortable, on room air, appears dehydrated Respiratory system: Clear to auscultation. Respiratory effort normal. Cardiovascular system: S1 & S2 heard, RRR. No JVD, murmurs, rubs, gallops or clicks. No pedal edema. Gastrointestinal system: Abdomen is nondistended, soft and nontender. No organomegaly or  masses felt. Normal bowel sounds heard. Central nervous system: Alert but confused, following commands, moving all extremities equally. Extremities: Symmetric 5 x 5 power. Skin: No rashes, lesions or ulcers Psychiatry: Judgement and insight appear normal. Mood & affect appropriate.    Data Reviewed: I have personally reviewed following labs and imaging studies  CBC: Recent Labs  Lab 10/31/22 1304 11/01/22 0343  WBC 9.1 8.9  NEUTROABS 6.6  --   HGB 14.9 12.5  HCT 42.9 35.8*  MCV 87.7 88.8  PLT 302 [id number]   Basic Metabolic Panel: Recent Labs  Lab 10/31/22 1304 11/01/22 0343  NA 135 136  K 2.6* 4.0  CL 97* 105  CO2 25 21*  GLUCOSE 89 69*  BUN 10 9  CREATININE 0.72 0.58  CALCIUM 9.1 8.3*  MG 2.0  --    GFR: CrCl cannot be calculated (Unknown ideal weight.). Liver Function Tests: Recent Labs  Lab 10/31/22 1304  AST 24  ALT 16  ALKPHOS 73  BILITOT 1.2  PROT 7.9  ALBUMIN 4.0   No results for input(s): "LIPASE", "AMYLASE" in the last 168 hours. Recent Labs  Lab 10/31/22 1816  AMMONIA 16   Coagulation Profile: Recent Labs  Lab 10/31/22 1304  INR 1.0   Cardiac Enzymes: Recent Labs  Lab 10/31/22 1304  CKTOTAL 84   BNP (last 3 results) No results for input(s): "PROBNP" in the last 8760 hours. HbA1C: No results for input(s): "HGBA1C" in the last 72 hours. CBG: Recent Labs  Lab 10/31/22 1721 10/31/22 2139 11/01/22 0645 11/01/22 0745 11/01/22 1127  GLUCAP 77 83 80 102* 100*   Lipid Profile: No results for input(s): "CHOL", "HDL", "LDLCALC", "TRIG", "CHOLHDL", "LDLDIRECT" in the last 72 hours. Thyroid Function Tests: No results for input(s): "TSH", "T4TOTAL", "FREET4", "T3FREE", "THYROIDAB" in the last 72 hours. Anemia Panel: No results for input(s): "VITAMINB12", "FOLATE", "FERRITIN", "TIBC", "IRON", "RETICCTPCT" in the last 72 hours. Sepsis Labs: Recent Labs  Lab 10/31/22 1304  LATICACIDVEN 1.7    Recent Results (from the past 240 hour(s))   Culture, blood (Routine x 2)     Status: None (Preliminary result)   Collection Time: 10/31/22  1:04 PM   Specimen: BLOOD  Result Value Ref Range Status   Specimen Description   Final    BLOOD BLOOD RIGHT ARM Performed at [facility], [place] [address], [place], [place] [address]    Special Requests   Final    BOTTLES DRAWN AEROBIC AND ANAEROBIC Blood Culture results may not be optimal due to an inadequate volume of blood received in culture bottles Performed at [facility] [address] [place], [place] [address]    Culture   Final    NO GROWTH < 24 HOURS Performed at [facility] [address] [place], [place] [address]    Report Status PENDING  Incomplete  Culture, blood (Routine x 2)     Status: None (Preliminary result)   Collection Time: 10/31/22  1:04 PM   Specimen: BLOOD  Result Value Ref Range Status   Specimen Description   Final    BLOOD BLOOD LEFT HAND Performed at [facility] [address]  [place], [place] [address]    Special Requests   Final    BOTTLES DRAWN AEROBIC AND ANAEROBIC Blood Culture adequate volume Performed at [facility] [address] [place], [place] [address]    Culture   Final    NO GROWTH < 24 HOURS Performed at [facility] [address] [place], [place] [address]    Report Status PENDING  Incomplete  Resp panel by RT-PCR (RSV, Flu A&B, Covid)     Status: None   Collection Time: 10/31/22  1:04 PM   Specimen: Nasal Swab  Result Value Ref Range Status   SARS Coronavirus 2 by RT PCR NEGATIVE NEGATIVE Final    Comment: (NOTE) SARS-CoV-2 target nucleic acids are NOT DETECTED.  The SARS-CoV-2 RNA is generally detectable in upper respiratory specimens during the acute phase of infection. The lowest concentration of SARS-CoV-2 viral copies this assay can detect is 138 copies/mL. A negative result does not preclude SARS-Cov-2 infection and  should not be used as the sole basis for treatment or other patient management decisions. A negative result may occur with  improper specimen collection/handling, submission of specimen other than nasopharyngeal swab, presence of viral mutation(s) within the areas targeted by this assay, and inadequate number of viral copies(<138 copies/mL). A negative result must be combined with clinical observations, patient history, and epidemiological information. The expected result is Negative.  Fact Sheet for Patients:  EntrepreneurPulse.com.au  Fact Sheet for Healthcare Providers:  IncredibleEmployment.be  This test is no t yet approved or cleared by the [place] FDA and  has been authorized for detection and/or diagnosis of SARS-CoV-2 by FDA under an Emergency Use Authorization (EUA). This EUA will remain  in effect (meaning this test can be used) for the duration of the COVID-19 declaration under Section 564(b)(1) of the Act, 21 U.S.C.section 360bbb-3(b)(1), unless the authorization is terminated  or revoked sooner.       Influenza A by PCR NEGATIVE NEGATIVE Final   Influenza B by PCR NEGATIVE NEGATIVE Final    Comment: (NOTE) The Xpert Xpress SARS-CoV-2/FLU/RSV plus assay is intended as an aid in the diagnosis of influenza from Nasopharyngeal swab specimens and should not be used as a sole basis for treatment. Nasal washings and aspirates are unacceptable for Xpert Xpress SARS-CoV-2/FLU/RSV testing.  Fact Sheet for Patients: EntrepreneurPulse.com.au  Fact Sheet for Healthcare Providers: IncredibleEmployment.be  This test is not yet approved or cleared by the [place] FDA and has been authorized for detection and/or diagnosis of SARS-CoV-2 by FDA under an Emergency Use Authorization (EUA). This EUA will remain in effect (meaning this test can be used) for the duration of the COVID-19 declaration  under Section 564(b)(1) of the Act, 21 U.S.C. section 360bbb-3(b)(1), unless the authorization is terminated or revoked.     Resp Syncytial Virus by PCR NEGATIVE NEGATIVE Final    Comment: (NOTE) Fact Sheet for Patients: EntrepreneurPulse.com.au  Fact Sheet for Healthcare Providers: IncredibleEmployment.be  This test is not yet approved or cleared by the [place] FDA and has been authorized for detection and/or diagnosis of SARS-CoV-2 by FDA under an Emergency Use Authorization (EUA). This EUA will remain in effect (meaning this test can be used) for the duration of the COVID-19 declaration under Section 564(b)(1) of the Act, 21 U.S.C. section 360bbb-3(b)(1), unless the authorization is terminated or revoked.  Performed at [facility], [place] [address] [place], [place] [address]       Radiology Studies: CT Head Wo Contrast  Result Date: 10/31/2022 CLINICAL DATA:  Mental status change, unknown cause. EXAM: CT HEAD WITHOUT CONTRAST TECHNIQUE: Contiguous axial images were obtained from the base of the skull through the vertex without intravenous contrast. RADIATION DOSE REDUCTION: This exam was performed according to the departmental dose-optimization program which includes automated exposure control, adjustment of the mA and/or kV according to patient size and/or use of iterative reconstruction technique. COMPARISON:  Head CT 09/10/2022.  MRI brain 09/06/2022. FINDINGS: Brain: No acute intracranial hemorrhage. 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 No hydrocephalus or extra-axial collection. No mass effect or midline shift. Vascular: No hyperdense vessel or unexpected calcification. Skull: No calvarial fracture or suspicious bone lesion. Skull base is unremarkable. Sinuses/Orbits:  Unremarkable. Other: None. IMPRESSION: 1. No acute intracranial abnormality. 2. 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 Electronically Signed   By: Emmit Alexanders M.D.   On: 10/31/2022 13:52   DG Chest Port 1 View  Result Date: 10/31/2022 CLINICAL DATA:  Sepsis EXAM: PORTABLE CHEST 1 VIEW COMPARISON:  09/21/2022 x-ray FINDINGS: The left inferior costophrenic angle is clipped at the edge of the film. Film is rotated to the left as well. Stable cardiopericardial silhouette with calcified aorta. Surgical changes along the left upper lung. Surgical clips as well overlying the right axillary region. The right lung is clear. What is seen of the left lung is clear. No edema. Overlapping cardiac leads. Osteopenia. IMPRESSION: Limited x-ray. Grossly clear lungs without pneumothorax or effusion. Surgical changes Repeat x-ray with standard technique as clinically appropriate Electronically Signed   By: Jill Side M.D.   On: 10/31/2022 12:44    Scheduled Meds:  albuterol  2.5 mg Nebulization Q6H   clopidogrel  75 mg Oral Daily   enoxaparin (LOVENOX) injection  40 mg Subcutaneous Q24H   gabapentin  300 mg Oral TID   insulin aspart  0-15 Units Subcutaneous TID WC   insulin aspart  0-5 Units Subcutaneous QHS   morphine  15 mg Oral QID   rosuvastatin  10 mg Oral Daily   Continuous Infusions:   LOS: 0 days   Time spent: 35 minutes   Timberlee Roblero Loann Quill, MD [facility]  If 7PM-7AM, please contact night-coverage [email or web address] 11/01/2022, 1:15 PM

## 2022-11-01 NOTE — Plan of Care (Signed)
  Problem: Clinical Measurements: Goal: Ability to maintain clinical measurements within normal limits will improve Outcome: Progressing   

## 2022-11-02 DIAGNOSIS — G9341 Metabolic encephalopathy: Secondary | ICD-10-CM | POA: Diagnosis not present

## 2022-11-02 LAB — BASIC METABOLIC PANEL
Anion gap: 6 (ref 5–15)
BUN: 9 mg/dL (ref 8–23)
CO2: 22 mmol/L (ref 22–32)
Calcium: 8.3 mg/dL — ABNORMAL LOW (ref 8.9–10.3)
Chloride: 106 mmol/L (ref 98–111)
Creatinine, Ser: 0.69 mg/dL (ref 0.44–1.00)
GFR, Estimated: >60 mL/min (ref >60)
Glucose, Bld: 92 mg/dL (ref 70–99)
Potassium: 3.5 mmol/L (ref 3.5–5.1)
Sodium: 134 mmol/L — ABNORMAL LOW (ref 135–145)

## 2022-11-02 LAB — CBC
HCT: 35.6 % — ABNORMAL LOW (ref 36.0–46.0)
Hemoglobin: 12 g/dL (ref 12.0–15.0)
MCH: 30.9 pg (ref 26.0–34.0)
MCHC: 33.7 g/dL (ref 30.0–36.0)
MCV: 91.8 fL (ref 80.0–100.0)
Platelets: 167 10*3/uL (ref 150–400)
RBC: 3.88 MIL/uL (ref 3.87–5.11)
RDW: 14.8 % (ref 11.5–15.5)
WBC: 10.5 10*3/uL (ref 4.0–10.5)
nRBC: 0 % (ref 0.0–0.2)

## 2022-11-02 LAB — GLUCOSE, CAPILLARY
Glucose-Capillary: 90 mg/dL (ref 70–99)
Glucose-Capillary: 95 mg/dL (ref 70–99)

## 2022-11-02 LAB — RPR: RPR Ser Ql: NONREACTIVE

## 2022-11-02 MED ORDER — ALBUTEROL SULFATE (2.5 MG/3ML) 0.083% IN NEBU: 2.5000 mg | INHALATION_SOLUTION | Freq: Two times a day (BID) | RESPIRATORY_TRACT | Status: DC

## 2022-11-02 NOTE — Progress Notes (Signed)
Pt wants her provider to know she wants to go home.

## 2022-11-02 NOTE — Progress Notes (Signed)
Mobility Specialist - Progress Note   11/02/22 1200  Mobility  Activity Ambulated with assistance in hallway  Level of Assistance Standby assist, set-up cues, supervision of patient - no hands on  Assistive Device Front wheel walker  Distance Ambulated (ft) 120 ft  Activity Response Tolerated well  Mobility Referral Yes  $Mobility charge 1 Mobility   Pt received in bed and agreed to mobility, had no issues during session, returned to chair with all needs met and alarm on.  Roderick Pee Mobility Specialist

## 2022-11-02 NOTE — Discharge Summary (Signed)
Physician Discharge Summary  [name] [id number] DOB: 1946/04/16 DOA: 10/31/2022  PCP: [name], FNP  Admit date: 10/31/2022 Discharge date: 11/02/2022  Admitted From: Home Disposition:  Home  Recommendations for Outpatient Follow-up:  Follow-up with pain management outpatient Recommend to take gabapentin 300 mg 3 times daily, oxycodone 10 mg every 6 hours as needed for pain and MS Contin 15 mg every 6 hours as needed for severe pain.   Recommend smoking cessation  Home Health: None Equipment/Devices: None Discharge Condition: Stable CODE STATUS: Full code Diet recommendation: Heart healthy diet  Brief/Interim Summary:  [name] is a 77 y.o. female with medical history significant for PE on room air, coronary artery disease, hypertension, hyperlipidemia on high doses of MS Contin, oxycodone and gabapentin due to chronic pain syndrome being admitted to the hospital with recurrent confusion. patient was noted to be quite confused, covered in her own feces. At baseline apparently the patient is alert and oriented x 4. Evaluation in the emergency department reveals that the patient is afebrile, vital signs are stable other than elevated blood pressure of 151/83. Lab work is unremarkable except for potassium of 2.6, magnesium is normal at 2.0. Chest x-ray and urinalysis are also unimpressive.  Admitted for further evaluation and management of acute metabolic encephalopathy.   Acute metabolic encephalopathy: -Multifactorial in the setting of electrolyte derangement as well as polypharmacy since patient has been on high-dose of narcotics at home -Workup including CT head negative for any acute findings.  Chest x-ray negative for infection.  UA unremarkable.  Afebrile with no leukocytosis.  Lactic acid: WNL.  COVID, flu, RSV all resulted negative.  Ammonia: WNL.  TSH: WNL B12: Elevated, RPR and HIV negative -Replenish electrolytes. -PT/OT evaluation-recommended no PT/OT  follow-up -Avoid high-dose narcotics -Her confusion resolved.  Alert and oriented x 3.  Chronic pain: -Followed by pain management outpatient -On gabapentin 600 mg 4 times a day, oxycodone 15 mg 4 times a day and MS Contin 30 mg 4 times a day at home -Resumed on lower dose gabapentin 300 3 times daily, oxycodone 10 mg 4 times daily and MS Contin 15 mg 4 times a day.   -Needs to follow-up with pain management to discuss about going down on narcotics and gabapentin dose.  Hypokalemia: Replenished   Hypertension: Not on any medications at home per chart -BP elevated upon arrival.  Started on hydralazine as needed.  BP improved.  Type II diabetes: Last A1c 6.2%.  Started on sliding scale insulin.  Resume metformin at the time of discharge.  Hyperlipidemia: Continued Crestor   History of CVA: Continue Plavixd and Crestor   Tobacco abuse: Counseled about cessation  Aortic atherosclerosis Coronary artery disease Emphysema: -Noted on CT abdomen/pelvis on 09/05/2022   Discharge Diagnoses:  Acute metabolic encephalopathy Hypokalemia Hypertension Type 2 diabetes Hyperlipidemia History of CVA Chronic pain Tobacco abuse Aortic atherosclerosis Emphysema Coronary artery disease   Discharge Instructions  Discharge Instructions     Discharge instructions   Complete by: As directed    Follow-up with pain management outpatient and discuss tapering off your narcotics as well as gabapentin dose.   Increase activity slowly   Complete by: As directed       Allergies as of 11/02/2022       Reactions   Cefuroxime Other (See Comments)   Oral ulcers   Cephalexin Other (See Comments)   Took off first layer of skin inside of mouth   Chlorhexidine Other (See Comments)  Mouth broke out- oral ulcers   Prednisone Other (See Comments)   Possible oral ulcers   Zithromax [azithromycin Dihydrate] Other (See Comments)   ORAL ULCERS        Medication List     TAKE these  medications    albuterol 108 (90 Base) MCG/ACT inhaler Commonly known as: VENTOLIN HFA Inhale 2 puffs into the lungs every 6 (six) hours as needed for wheezing or shortness of breath.   ascorbic acid 500 MG tablet Commonly known as: VITAMIN C Take 500 mg by mouth daily.   BIOTIN EXTRA STRENGTH PO Take 500 mg by mouth daily.   Centrum Silver 50+Women Tabs Take 1 tablet by mouth 2 (two) times a week.   cholecalciferol 25 MCG (1000 UNIT) tablet Commonly known as: VITAMIN D3 Take 1,000 Units by mouth daily.   clopidogrel 75 MG tablet Commonly known as: PLAVIX Take 1 tablet (75 mg total) by mouth daily.   diclofenac 75 MG EC tablet Commonly known as: VOLTAREN Take 75 mg by mouth 2 (two) times daily as needed for mild pain.   furosemide 20 MG tablet Commonly known as: LASIX Take 1 tablet (20 mg total) by mouth daily as needed for edema. What changed: when to take this   gabapentin 600 MG tablet Commonly known as: NEURONTIN Take 600 mg by mouth 4 (four) times daily.   losartan 50 MG tablet Commonly known as: COZAAR Take 50 mg by mouth daily.   metFORMIN 500 MG 24 hr tablet Commonly known as: GLUCOPHAGE-XR Take 500 mg by mouth every evening.   methocarbamol 500 MG tablet Commonly known as: ROBAXIN Take 500 mg by mouth daily as needed for muscle spasms.   morphine 30 MG 12 hr tablet Commonly known as: MS CONTIN Take 30 mg by mouth 4 (four) times daily.   oxyCODONE 15 MG immediate release tablet Commonly known as: ROXICODONE Take 15 mg by mouth 4 (four) times daily.   rosuvastatin 10 MG tablet Commonly known as: CRESTOR TAKE 1 TABLET BY MOUTH EVERY DAY   TYLENOL 500 MG tablet Generic drug: acetaminophen Take 1,000 mg by mouth every 6 (six) hours as needed for headache or mild pain.        Allergies  Allergen Reactions   Cefuroxime Other (See Comments)    Oral ulcers   Cephalexin Other (See Comments)    Took off first layer of skin inside of mouth    Chlorhexidine Other (See Comments)    Mouth broke out- oral ulcers   Prednisone Other (See Comments)    Possible oral ulcers   Zithromax [Azithromycin Dihydrate] Other (See Comments)    ORAL ULCERS     Consultations: None   Procedures/Studies: CT Head Wo Contrast  Result Date: 10/31/2022 CLINICAL DATA:  Mental status change, unknown cause. EXAM: CT HEAD WITHOUT CONTRAST TECHNIQUE: Contiguous axial images were obtained from the base of the skull through the vertex without intravenous contrast. RADIATION DOSE REDUCTION: This exam was performed according to the departmental dose-optimization program which includes automated exposure control, adjustment of the mA and/or kV according to patient size and/or use of iterative reconstruction technique. COMPARISON:  Head CT 09/10/2022.  MRI brain 09/06/2022. FINDINGS: Brain: No acute intracranial hemorrhage. 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 No hydrocephalus or extra-axial collection. No mass effect or midline shift. Vascular: No hyperdense vessel or unexpected calcification. Skull: No calvarial fracture or suspicious  bone lesion. Skull base is unremarkable. Sinuses/Orbits: Unremarkable. Other: None. IMPRESSION: 1. No acute intracranial abnormality. 2. 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 Electronically Signed   By: Emmit Alexanders M.D.   On: 10/31/2022 13:52   DG Chest Port 1 View  Result Date: 10/31/2022 CLINICAL DATA:  Sepsis EXAM: PORTABLE CHEST 1 VIEW COMPARISON:  09/21/2022 x-ray FINDINGS: The left inferior costophrenic angle is clipped at the edge of the film. Film is rotated to the left as well. Stable cardiopericardial silhouette with calcified  aorta. Surgical changes along the left upper lung. Surgical clips as well overlying the right axillary region. The right lung is clear. What is seen of the left lung is clear. No edema. Overlapping cardiac leads. Osteopenia. IMPRESSION: Limited x-ray. Grossly clear lungs without pneumothorax or effusion. Surgical changes Repeat x-ray with standard technique as clinically appropriate Electronically Signed   By: Jill Side M.D.   On: 10/31/2022 12:44   CARDIAC EVENT MONITOR  Result Date: 10/07/2022   Normal sinus rhythm   No arrhythmia noted   No Afib.      Subjective: Patient seen and examined.  Sitting comfortably on the bed, drinking coffee.  Alert and oriented x 3.  Answered all questions appropriately.  Wishes to go home.  Talked to the daughter on the phone.  Agreed for the discharge.  Discharge Exam: Vitals:   11/02/22 0907 11/02/22 0910  BP:    Pulse:    Resp:    Temp:    SpO2: 97% 97%   Vitals:   11/02/22 0343 11/02/22 0739 11/02/22 0907 11/02/22 0910  BP: 117/63 138/69    Pulse: 70 60    Resp: 16 18    Temp: 98.4 F (36.9 C) 98.4 F (36.9 C)    TempSrc: Oral Oral    SpO2: 99% 97% 97% 97%    General: Pt is alert, awake, not in acute distress, on room air, communicating well, alert and oriented x 3 Cardiovascular: RRR, S1/S2 +, no rubs, no gallops Respiratory: CTA bilaterally, no wheezing, no rhonchi Abdominal: Soft, NT, ND, bowel sounds + Extremities: no edema, no cyanosis    The results of significant diagnostics from this hospitalization (including imaging, microbiology, ancillary and laboratory) are listed below for reference.     Microbiology: Recent Results (from the past 240 hour(s))  Culture, blood (Routine x 2)     Status: None (Preliminary result)   Collection Time: 10/31/22  1:04 PM   Specimen: BLOOD  Result Value Ref Range Status   Specimen Description   Final    BLOOD BLOOD RIGHT ARM Performed at [facility]  [address], [place], [place] [address]    Special Requests   Final    BOTTLES DRAWN AEROBIC AND ANAEROBIC Blood Culture results may not be optimal due to an inadequate volume of blood received in culture bottles Performed at [facility] [address], [place], [place] [address]    Culture   Final    NO GROWTH 2 DAYS Performed at [facility] [address] [place], [place] [address]    Report Status PENDING  Incomplete  Culture, blood (Routine x 2)     Status: None (Preliminary result)   Collection Time: 10/31/22  1:04 PM   Specimen: BLOOD  Result Value Ref Range Status   Specimen Description   Final  BLOOD BLOOD LEFT HAND Performed at [facility] [address], [place], [place] [address]    Special Requests   Final    BOTTLES DRAWN AEROBIC AND ANAEROBIC Blood Culture adequate volume Performed at [facility] [address] [place], [place] [address]    Culture   Final    NO GROWTH 2 DAYS Performed at [facility] [address], [place], [place] [address]    Report Status PENDING  Incomplete  Resp panel by RT-PCR (RSV, Flu A&B, Covid)     Status: None   Collection Time: 10/31/22  1:04 PM   Specimen: Nasal Swab  Result Value Ref Range Status   SARS Coronavirus 2 by RT PCR NEGATIVE NEGATIVE Final    Comment: (NOTE) SARS-CoV-2 target nucleic acids are NOT DETECTED.  The SARS-CoV-2 RNA is generally detectable in upper respiratory specimens during the acute phase of infection. The lowest concentration of SARS-CoV-2 viral copies this assay can detect is 138 copies/mL. A negative result does not preclude SARS-Cov-2 infection and should not be used as the sole basis for treatment or other patient management decisions. A negative result may occur with  improper specimen collection/handling, submission of specimen other than nasopharyngeal swab, presence of viral mutation(s) within the areas targeted by  this assay, and inadequate number of viral copies(<138 copies/mL). A negative result must be combined with clinical observations, patient history, and epidemiological information. The expected result is Negative.  Fact Sheet for Patients:  EntrepreneurPulse.com.au  Fact Sheet for Healthcare Providers:  IncredibleEmployment.be  This test is no t yet approved or cleared by the [place] FDA and  has been authorized for detection and/or diagnosis of SARS-CoV-2 by FDA under an Emergency Use Authorization (EUA). This EUA will remain  in effect (meaning this test can be used) for the duration of the COVID-19 declaration under Section 564(b)(1) of the Act, 21 U.S.C.section 360bbb-3(b)(1), unless the authorization is terminated  or revoked sooner.       Influenza A by PCR NEGATIVE NEGATIVE Final   Influenza B by PCR NEGATIVE NEGATIVE Final    Comment: (NOTE) The Xpert Xpress SARS-CoV-2/FLU/RSV plus assay is intended as an aid in the diagnosis of influenza from Nasopharyngeal swab specimens and should not be used as a sole basis for treatment. Nasal washings and aspirates are unacceptable for Xpert Xpress SARS-CoV-2/FLU/RSV testing.  Fact Sheet for Patients: EntrepreneurPulse.com.au  Fact Sheet for Healthcare Providers: IncredibleEmployment.be  This test is not yet approved or cleared by the [place] FDA and has been authorized for detection and/or diagnosis of SARS-CoV-2 by FDA under an Emergency Use Authorization (EUA). This EUA will remain in effect (meaning this test can be used) for the duration of the COVID-19 declaration under Section 564(b)(1) of the Act, 21 U.S.C. section 360bbb-3(b)(1), unless the authorization is terminated or revoked.     Resp Syncytial Virus by PCR NEGATIVE NEGATIVE Final    Comment: (NOTE) Fact Sheet for Patients: EntrepreneurPulse.com.au  Fact Sheet  for Healthcare Providers: IncredibleEmployment.be  This test is not yet approved or cleared by the [place] FDA and has been authorized for detection and/or diagnosis of SARS-CoV-2 by FDA under an Emergency Use Authorization (EUA). This EUA will remain in effect (meaning this test can be used) for the duration of the COVID-19 declaration under Section 564(b)(1) of the Act, 21 U.S.C. section 360bbb-3(b)(1), unless the authorization is terminated or revoked.  Performed at [facility], [address] [place], [place]  [address]      Labs: BNP (last 3 results) No results for input(s): "BNP" in the last 8760 hours. Basic Metabolic Panel: Recent Labs  Lab 10/31/22 1304 11/01/22 0343 11/02/22 0735  NA 135 136 134*  K 2.6* 4.0 3.5  CL 97* 105 106  CO2 25 21* 22  GLUCOSE 89 69* 92  BUN 10 9 9  $ CREATININE 0.72 0.58 0.69  CALCIUM 9.1 8.3* 8.3*  MG 2.0  --   --    Liver Function Tests: Recent Labs  Lab 10/31/22 1304  AST 24  ALT 16  ALKPHOS 73  BILITOT 1.2  PROT 7.9  ALBUMIN 4.0   No results for input(s): "LIPASE", "AMYLASE" in the last 168 hours. Recent Labs  Lab 10/31/22 1816  AMMONIA 16   CBC: Recent Labs  Lab 10/31/22 1304 11/01/22 0343 11/02/22 0735  WBC 9.1 8.9 10.5  NEUTROABS 6.6  --   --   HGB 14.9 12.5 12.0  HCT 42.9 35.8* 35.6*  MCV 87.7 88.8 91.8  PLT 302 220 167   Cardiac Enzymes: Recent Labs  Lab 10/31/22 1304  CKTOTAL 84   BNP: Invalid input(s): "POCBNP" CBG: Recent Labs  Lab 11/01/22 0745 11/01/22 1127 11/01/22 1619 11/01/22 2045 11/02/22 0736  GLUCAP 102* 100* 130* 93 90   D-Dimer No results for input(s): "DDIMER" in the last 72 hours. Hgb A1c No results for input(s): "HGBA1C" in the last 72 hours. Lipid Profile No results for input(s): "CHOL", "HDL", "LDLCALC", "TRIG", "CHOLHDL", "LDLDIRECT" in the last 72 hours. Thyroid function studies Recent Labs    11/01/22 1432  TSH 2.846    Anemia work up Recent Labs    11/01/22 1432  VITAMINB12 1,126*   Urinalysis    Component Value Date/Time   COLORURINE YELLOW 10/31/2022 [place] 10/31/2022 1514   LABSPEC 1.015 10/31/2022 1514   PHURINE 7.0 10/31/2022 1514   GLUCOSEU NEGATIVE 10/31/2022 1514   HGBUR NEGATIVE 10/31/2022 [place] 10/31/2022 1514   KETONESUR 20 (A) 10/31/2022 1514   PROTEINUR 100 (A) 10/31/2022 1514   UROBILINOGEN 1.0 04/26/2015 1748   NITRITE NEGATIVE 10/31/2022 1514   LEUKOCYTESUR NEGATIVE 10/31/2022 1514   Sepsis Labs Recent Labs  Lab 10/31/22 1304 11/01/22 0343 11/02/22 0735  WBC 9.1 8.9 10.5   Microbiology Recent Results (from the past 240 hour(s))  Culture, blood (Routine x 2)     Status: None (Preliminary result)   Collection Time: 10/31/22  1:04 PM   Specimen: BLOOD  Result Value Ref Range Status   Specimen Description   Final    BLOOD BLOOD RIGHT ARM Performed at [facility], [place] [address], [place], [place] [address]    Special Requests   Final    BOTTLES DRAWN AEROBIC AND ANAEROBIC Blood Culture results may not be optimal due to an inadequate volume of blood received in culture bottles Performed at [facility] [address] [place], [place] [address]    Culture   Final    NO GROWTH 2 DAYS Performed at [facility] [address], [place],  [address]    Report Status PENDING  Incomplete  Culture, blood (Routine x 2)     Status: None (Preliminary result)   Collection Time: 10/31/22  1:04 PM   Specimen: BLOOD  Result Value Ref Range Status   Specimen Description   Final    BLOOD BLOOD LEFT HAND Performed at [facility]  [address] [place], [place] [address]    Special Requests   Final    BOTTLES DRAWN AEROBIC AND ANAEROBIC Blood Culture adequate volume Performed at [facility] [address], [place], [place] [address]     Culture   Final    NO GROWTH 2 DAYS Performed at [facility] [address] [place], [place] [address]    Report Status PENDING  Incomplete  Resp panel by RT-PCR (RSV, Flu A&B, Covid)     Status: None   Collection Time: 10/31/22  1:04 PM   Specimen: Nasal Swab  Result Value Ref Range Status   SARS Coronavirus 2 by RT PCR NEGATIVE NEGATIVE Final    Comment: (NOTE) SARS-CoV-2 target nucleic acids are NOT DETECTED.  The SARS-CoV-2 RNA is generally detectable in upper respiratory specimens during the acute phase of infection. The lowest concentration of SARS-CoV-2 viral copies this assay can detect is 138 copies/mL. A negative result does not preclude SARS-Cov-2 infection and should not be used as the sole basis for treatment or other patient management decisions. A negative result may occur with  improper specimen collection/handling, submission of specimen other than nasopharyngeal swab, presence of viral mutation(s) within the areas targeted by this assay, and inadequate number of viral copies(<138 copies/mL). A negative result must be combined with clinical observations, patient history, and epidemiological information. The expected result is Negative.  Fact Sheet for Patients:  EntrepreneurPulse.com.au  Fact Sheet for Healthcare Providers:  IncredibleEmployment.be  This test is no t yet approved or cleared by the [place] FDA and  has been authorized for detection and/or diagnosis of SARS-CoV-2 by FDA under an Emergency Use Authorization (EUA). This EUA will remain  in effect (meaning this test can be used) for the duration of the COVID-19 declaration under Section 564(b)(1) of the Act, 21 U.S.C.section 360bbb-3(b)(1), unless the authorization is terminated  or revoked sooner.       Influenza A by PCR NEGATIVE NEGATIVE Final   Influenza B by PCR NEGATIVE NEGATIVE Final    Comment: (NOTE) The Xpert Xpress SARS-CoV-2/FLU/RSV  plus assay is intended as an aid in the diagnosis of influenza from Nasopharyngeal swab specimens and should not be used as a sole basis for treatment. Nasal washings and aspirates are unacceptable for Xpert Xpress SARS-CoV-2/FLU/RSV testing.  Fact Sheet for Patients: EntrepreneurPulse.com.au  Fact Sheet for Healthcare Providers: IncredibleEmployment.be  This test is not yet approved or cleared by the [place] FDA and has been authorized for detection and/or diagnosis of SARS-CoV-2 by FDA under an Emergency Use Authorization (EUA). This EUA will remain in effect (meaning this test can be used) for the duration of the COVID-19 declaration under Section 564(b)(1) of the Act, 21 U.S.C. section 360bbb-3(b)(1), unless the authorization is terminated or revoked.     Resp Syncytial Virus by PCR NEGATIVE NEGATIVE Final    Comment: (NOTE) Fact Sheet for Patients: EntrepreneurPulse.com.au  Fact Sheet for Healthcare Providers: IncredibleEmployment.be  This test is not yet approved or cleared by the [place] FDA and has been authorized for detection and/or diagnosis of SARS-CoV-2 by FDA under an Emergency Use Authorization (EUA). This EUA will remain in effect (meaning this test can be used) for the duration of the COVID-19 declaration under Section 564(b)(1) of the Act, 21 U.S.C. section 360bbb-3(b)(1), unless the authorization is terminated or revoked.  Performed at [facility], [place] [address] [place], [place] [address]      Time coordinating discharge: Over 30 minutes  SIGNED:   Mckinley Jewel, MD  [facility] 11/02/2022, 11:47 AM Pager   If 7PM-7AM, please contact night-coverage [email or web address]

## 2022-11-02 NOTE — Progress Notes (Signed)
  Transition of Care [facility]) Screening Note   Patient Details  Name: [name] Date of Birth: Apr 04, 1946   Transition of Care [facility]) CM/SW Contact:    Vassie Moselle, LCSW Phone Number: 11/02/2022, 12:01 PM    Transition of Care Department [facility]) has reviewed patient and no TOC needs have been identified at this time. We will continue to monitor patient advancement through interdisciplinary progression rounds. If new patient transition needs arise, please place a TOC consult.

## 2022-11-05 LAB — CULTURE, BLOOD (ROUTINE X 2)
Culture: NO GROWTH
Culture: NO GROWTH
Special Requests: ADEQUATE

## 2022-11-07 DIAGNOSIS — Z09 Encounter for follow-up examination after completed treatment for conditions other than malignant neoplasm: Secondary | ICD-10-CM | POA: Diagnosis not present

## 2022-11-07 DIAGNOSIS — E876 Hypokalemia: Secondary | ICD-10-CM | POA: Diagnosis not present

## 2022-11-07 DIAGNOSIS — N390 Urinary tract infection, site not specified: Secondary | ICD-10-CM | POA: Diagnosis not present

## 2022-11-07 DIAGNOSIS — I10 Essential (primary) hypertension: Secondary | ICD-10-CM | POA: Diagnosis not present

## 2022-11-15 ENCOUNTER — Other Ambulatory Visit: Payer: Self-pay

## 2022-11-15 ENCOUNTER — Emergency Department (HOSPITAL_COMMUNITY): Payer: Medicare Other

## 2022-11-15 ENCOUNTER — Inpatient Hospital Stay (HOSPITAL_COMMUNITY)
Admission: EM | Admit: 2022-11-15 | Discharge: 2022-11-19 | DRG: 092 | Disposition: A | Discharge status: Home / Self Care | Payer: Medicare Other | Attending: Internal Medicine | Admitting: Internal Medicine

## 2022-11-15 DIAGNOSIS — Z881 Allergy status to other antibiotic agents status: Secondary | ICD-10-CM | POA: Diagnosis not present

## 2022-11-15 DIAGNOSIS — M47816 Spondylosis without myelopathy or radiculopathy, lumbar region: Secondary | ICD-10-CM | POA: Diagnosis not present

## 2022-11-15 DIAGNOSIS — R531 Weakness: Secondary | ICD-10-CM | POA: Diagnosis not present

## 2022-11-15 DIAGNOSIS — Y92009 Unspecified place in unspecified non-institutional (private) residence as the place of occurrence of the external cause: Secondary | ICD-10-CM | POA: Diagnosis not present

## 2022-11-15 DIAGNOSIS — A0811 Acute gastroenteropathy due to Norwalk agent: Secondary | ICD-10-CM | POA: Insufficient documentation

## 2022-11-15 DIAGNOSIS — F1120 Opioid dependence, uncomplicated: Secondary | ICD-10-CM | POA: Diagnosis present

## 2022-11-15 DIAGNOSIS — T40605A Adverse effect of unspecified narcotics, initial encounter: Secondary | ICD-10-CM | POA: Diagnosis not present

## 2022-11-15 DIAGNOSIS — I2510 Atherosclerotic heart disease of native coronary artery without angina pectoris: Secondary | ICD-10-CM | POA: Diagnosis present

## 2022-11-15 DIAGNOSIS — J4489 Other specified chronic obstructive pulmonary disease: Secondary | ICD-10-CM | POA: Diagnosis present

## 2022-11-15 DIAGNOSIS — G319 Degenerative disease of nervous system, unspecified: Secondary | ICD-10-CM | POA: Diagnosis not present

## 2022-11-15 DIAGNOSIS — G9341 Metabolic encephalopathy: Secondary | ICD-10-CM | POA: Diagnosis present

## 2022-11-15 DIAGNOSIS — G928 Other toxic encephalopathy: Secondary | ICD-10-CM | POA: Diagnosis not present

## 2022-11-15 DIAGNOSIS — E785 Hyperlipidemia, unspecified: Secondary | ICD-10-CM | POA: Diagnosis not present

## 2022-11-15 DIAGNOSIS — G9340 Encephalopathy, unspecified: Secondary | ICD-10-CM | POA: Diagnosis not present

## 2022-11-15 DIAGNOSIS — Z6820 Body mass index (BMI) 20.0-20.9, adult: Secondary | ICD-10-CM

## 2022-11-15 DIAGNOSIS — G8929 Other chronic pain: Secondary | ICD-10-CM | POA: Diagnosis not present

## 2022-11-15 DIAGNOSIS — B370 Candidal stomatitis: Secondary | ICD-10-CM | POA: Diagnosis not present

## 2022-11-15 DIAGNOSIS — Z8249 Family history of ischemic heart disease and other diseases of the circulatory system: Secondary | ICD-10-CM

## 2022-11-15 DIAGNOSIS — Z853 Personal history of malignant neoplasm of breast: Secondary | ICD-10-CM | POA: Diagnosis not present

## 2022-11-15 DIAGNOSIS — Z1152 Encounter for screening for COVID-19: Secondary | ICD-10-CM | POA: Diagnosis not present

## 2022-11-15 DIAGNOSIS — I10 Essential (primary) hypertension: Secondary | ICD-10-CM | POA: Diagnosis not present

## 2022-11-15 DIAGNOSIS — R627 Adult failure to thrive: Secondary | ICD-10-CM | POA: Diagnosis not present

## 2022-11-15 DIAGNOSIS — Z515 Encounter for palliative care: Secondary | ICD-10-CM

## 2022-11-15 DIAGNOSIS — E876 Hypokalemia: Secondary | ICD-10-CM | POA: Diagnosis not present

## 2022-11-15 DIAGNOSIS — Z8673 Personal history of transient ischemic attack (TIA), and cerebral infarction without residual deficits: Secondary | ICD-10-CM

## 2022-11-15 DIAGNOSIS — I6523 Occlusion and stenosis of bilateral carotid arteries: Secondary | ICD-10-CM | POA: Diagnosis not present

## 2022-11-15 DIAGNOSIS — R8271 Bacteriuria: Secondary | ICD-10-CM | POA: Diagnosis not present

## 2022-11-15 DIAGNOSIS — Z79899 Other long term (current) drug therapy: Secondary | ICD-10-CM

## 2022-11-15 DIAGNOSIS — Z888 Allergy status to other drugs, medicaments and biological substances status: Secondary | ICD-10-CM | POA: Diagnosis not present

## 2022-11-15 DIAGNOSIS — I252 Old myocardial infarction: Secondary | ICD-10-CM | POA: Diagnosis not present

## 2022-11-15 DIAGNOSIS — Z823 Family history of stroke: Secondary | ICD-10-CM

## 2022-11-15 DIAGNOSIS — F17210 Nicotine dependence, cigarettes, uncomplicated: Secondary | ICD-10-CM | POA: Diagnosis not present

## 2022-11-15 DIAGNOSIS — E119 Type 2 diabetes mellitus without complications: Secondary | ICD-10-CM

## 2022-11-15 DIAGNOSIS — M1710 Unilateral primary osteoarthritis, unspecified knee: Secondary | ICD-10-CM | POA: Diagnosis present

## 2022-11-15 DIAGNOSIS — R9431 Abnormal electrocardiogram [ECG] [EKG]: Secondary | ICD-10-CM | POA: Diagnosis present

## 2022-11-15 DIAGNOSIS — Z743 Need for continuous supervision: Secondary | ICD-10-CM | POA: Diagnosis not present

## 2022-11-15 DIAGNOSIS — Z833 Family history of diabetes mellitus: Secondary | ICD-10-CM

## 2022-11-15 DIAGNOSIS — E860 Dehydration: Secondary | ICD-10-CM | POA: Diagnosis present

## 2022-11-15 DIAGNOSIS — R6889 Other general symptoms and signs: Secondary | ICD-10-CM | POA: Diagnosis not present

## 2022-11-15 LAB — COMPREHENSIVE METABOLIC PANEL
ALT: 13 U/L (ref 0–44)
AST: 20 U/L (ref 15–41)
Albumin: 3.3 g/dL — ABNORMAL LOW (ref 3.5–5.0)
Alkaline Phosphatase: 64 U/L (ref 38–126)
Anion gap: 12 (ref 5–15)
BUN: 10 mg/dL (ref 8–23)
CO2: 24 mmol/L (ref 22–32)
Calcium: 8.6 mg/dL — ABNORMAL LOW (ref 8.9–10.3)
Chloride: 98 mmol/L (ref 98–111)
Creatinine, Ser: 0.55 mg/dL (ref 0.44–1.00)
GFR, Estimated: >60 mL/min (ref >60)
Glucose, Bld: 103 mg/dL — ABNORMAL HIGH (ref 70–99)
Potassium: 2.5 mmol/L — CL (ref 3.5–5.1)
Sodium: 134 mmol/L — ABNORMAL LOW (ref 135–145)
Total Bilirubin: 1 mg/dL (ref 0.3–1.2)
Total Protein: 7 g/dL (ref 6.5–8.1)

## 2022-11-15 LAB — CBC WITH DIFFERENTIAL/PLATELET
Abs Immature Granulocytes: 0.02 10*3/uL (ref 0.00–0.07)
Basophils Absolute: 0 10*3/uL (ref 0.0–0.1)
Basophils Relative: 1 %
Eosinophils Absolute: 0 10*3/uL (ref 0.0–0.5)
Eosinophils Relative: 0 %
HCT: 38.2 % (ref 36.0–46.0)
Hemoglobin: 13.3 g/dL (ref 12.0–15.0)
Immature Granulocytes: 0 %
Lymphocytes Relative: 19 %
Lymphs Abs: 1.5 10*3/uL (ref 0.7–4.0)
MCH: 31.1 pg (ref 26.0–34.0)
MCHC: 34.8 g/dL (ref 30.0–36.0)
MCV: 89.3 fL (ref 80.0–100.0)
Monocytes Absolute: 0.6 10*3/uL (ref 0.1–1.0)
Monocytes Relative: 7 %
Neutro Abs: 5.9 10*3/uL (ref 1.7–7.7)
Neutrophils Relative %: 73 %
Platelets: 262 10*3/uL (ref 150–400)
RBC: 4.28 MIL/uL (ref 3.87–5.11)
RDW: 14.1 % (ref 11.5–15.5)
WBC: 8.1 10*3/uL (ref 4.0–10.5)
nRBC: 0 % (ref 0.0–0.2)

## 2022-11-15 LAB — TROPONIN I (HIGH SENSITIVITY): Troponin I (High Sensitivity): 9 ng/L (ref <18)

## 2022-11-15 LAB — RESP PANEL BY RT-PCR (RSV, FLU A&B, COVID)  RVPGX2
Influenza A by PCR: NEGATIVE
Influenza B by PCR: NEGATIVE
Resp Syncytial Virus by PCR: NEGATIVE
SARS Coronavirus 2 by RT PCR: NEGATIVE

## 2022-11-15 LAB — CBG MONITORING, ED: Glucose-Capillary: 108 mg/dL — ABNORMAL HIGH (ref 70–99)

## 2022-11-15 LAB — LIPASE, BLOOD: Lipase: 41 U/L (ref 11–51)

## 2022-11-15 LAB — LACTIC ACID, PLASMA: Lactic Acid, Venous: 1.2 mmol/L (ref 0.5–1.9)

## 2022-11-15 MED ORDER — CALCIUM CARBONATE ANTACID 500 MG PO CHEW: 1.0000 | CHEWABLE_TABLET | Freq: Three times a day (TID) | ORAL | Status: DC | PRN

## 2022-11-15 MED ORDER — LORAZEPAM 2 MG/ML IJ SOLN
1.0000 mg | Freq: Once | INTRAMUSCULAR | Status: AC
  Administered 2022-11-15: 1 mg via INTRAVENOUS
  Filled 2022-11-15: qty 1

## 2022-11-15 MED ORDER — ASPIRIN 81 MG PO TBEC
81.0000 mg | DELAYED_RELEASE_TABLET | Freq: Every day | ORAL | Status: DC
  Administered 2022-11-15 – 2022-11-19 (×5): 81 mg via ORAL
  Filled 2022-11-15 (×5): qty 1

## 2022-11-15 MED ORDER — POTASSIUM CHLORIDE CRYS ER 20 MEQ PO TBCR
40.0000 meq | EXTENDED_RELEASE_TABLET | Freq: Once | ORAL | Status: AC
  Administered 2022-11-15: 40 meq via ORAL
  Filled 2022-11-15: qty 2

## 2022-11-15 MED ORDER — POTASSIUM CHLORIDE 10 MEQ/100ML IV SOLN
10.0000 meq | INTRAVENOUS | Status: AC
  Administered 2022-11-15 – 2022-11-16 (×4): 10 meq via INTRAVENOUS
  Filled 2022-11-15 (×3): qty 100

## 2022-11-15 MED ORDER — ROSUVASTATIN CALCIUM 10 MG PO TABS
10.0000 mg | ORAL_TABLET | Freq: Every day | ORAL | Status: DC
  Administered 2022-11-15 – 2022-11-19 (×5): 10 mg via ORAL
  Filled 2022-11-15 (×5): qty 1

## 2022-11-15 MED ORDER — POTASSIUM CHLORIDE 10 MEQ/100ML IV SOLN
10.0000 meq | Freq: Once | INTRAVENOUS | Status: AC
  Administered 2022-11-15: 10 meq via INTRAVENOUS
  Filled 2022-11-15: qty 100

## 2022-11-15 NOTE — ED Triage Notes (Signed)
Pt arrived via EMS from home for failure to thrive. Pt has not eaten or had meds in 4 plus days.   BP 180/92 HR 70 RR 20 O2 98%  CBG 103

## 2022-11-15 NOTE — ED Provider Notes (Signed)
EMERGENCY DEPARTMENT AT [facility] Provider Note   CSN: [id number] Arrival date & time: 11/15/22  1706     History  Chief Complaint  Patient presents with   Failure To Thrive    [name] is a 77 y.o. female.  77 year old female with prior medical history as detailed below presents for evaluation.  Patient arrives with EMS transport from home.  Family reports that patient has had minimal to no intake orally for the last 4 days.  Patient reports that she feels weak.  She is otherwise without complaint.  She denies chest pain, shortness of breath, nausea, vomiting, abdominal pain, fevers.  Patient has not forthcoming with additional details.  Attempted contact made with patient's spouse and with patient's daughter.  No answer with attempted phone calls.    The history is provided by the patient, medical records and the EMS personnel.       Home Medications Prior to Admission medications   Medication Sig Start Date End Date Taking? Authorizing Provider  albuterol (PROVENTIL HFA;VENTOLIN HFA) 108 (90 BASE) MCG/ACT inhaler Inhale 2 puffs into the lungs every 6 (six) hours as needed for wheezing or shortness of breath.    [provider]  ascorbic acid (VITAMIN C) 500 MG tablet Take 500 mg by mouth daily.    [provider]  BIOTIN EXTRA STRENGTH PO Take 500 mg by mouth daily.    [provider]  cholecalciferol (VITAMIN D3) 25 MCG (1000 UNIT) tablet Take 1,000 Units by mouth daily.    [provider]  diclofenac (VOLTAREN) 75 MG EC tablet Take 75 mg by mouth 2 (two) times daily as needed for mild pain.    [provider]  furosemide (LASIX) 20 MG tablet Take 1 tablet (20 mg total) by mouth daily as needed for edema. Patient taking differently: Take 20 mg by mouth daily. 12/27/19 09/07/23  Martinique, Cathren Sween M, MD  gabapentin (NEURONTIN) 600 MG tablet Take 600 mg by mouth 4 (four) times daily.     [provider]  losartan (COZAAR) 50 MG tablet Take 50 mg by mouth daily.    [provider]  metFORMIN (GLUCOPHAGE-XR) 500 MG 24 hr tablet Take 500 mg by mouth every evening.    [provider]  methocarbamol (ROBAXIN) 500 MG tablet Take 500 mg by mouth daily as needed for muscle spasms. 09/05/22   [provider]  morphine (MS CONTIN) 30 MG 12 hr tablet Take 30 mg by mouth 4 (four) times daily.  12/23/16   [provider]  Multiple Vitamins-Minerals (CENTRUM SILVER 50+WOMEN) TABS Take 1 tablet by mouth 2 (two) times a week.    [provider]  oxyCODONE (ROXICODONE) 15 MG immediate release tablet Take 15 mg by mouth 4 (four) times daily.    [provider]  rosuvastatin (CRESTOR) 10 MG tablet TAKE 1 TABLET BY MOUTH EVERY DAY 10/23/22   Martinique, Jennamarie Goings M, MD  TYLENOL 500 MG tablet Take 1,000 mg by mouth every 6 (six) hours as needed for headache or mild pain.    [provider]      Allergies    Cefuroxime, Cephalexin, Chlorhexidine, Prednisone, and Zithromax [azithromycin dihydrate]    Review of Systems   Review of Systems  All other systems reviewed and are negative.   Physical Exam Updated Vital Signs BP (!) 172/80 (BP Location: Left Arm)   Pulse 62   Resp 18   SpO2 98%  Physical Exam  Vitals and nursing note reviewed.  Constitutional:      General: She is not in acute distress.    Appearance: She is well-developed.     Comments: Alert, chronically ill in appearance  HENT:     Head: Normocephalic and atraumatic.     Mouth/Throat:     Mouth: Mucous membranes are dry.  Eyes:     Conjunctiva/sclera: Conjunctivae normal.     Pupils: Pupils are equal, round, and reactive to light.  Cardiovascular:     Rate and Rhythm: Normal rate and regular rhythm.     Heart sounds: Normal heart sounds.  Pulmonary:     Effort: Pulmonary effort is normal. No respiratory distress.     Breath sounds: Normal breath sounds.  Abdominal:      General: There is no distension.     Palpations: Abdomen is soft.     Tenderness: There is no abdominal tenderness.  Musculoskeletal:        General: No deformity. Normal range of motion.     Cervical back: Normal range of motion and neck supple.  Skin:    General: Skin is warm and dry.  Neurological:     General: No focal deficit present.     Mental Status: She is alert and oriented to person, place, and time.     ED Results / Procedures / Treatments   Labs (all labs ordered are listed, but only abnormal results are displayed) Labs Reviewed  COMPREHENSIVE METABOLIC PANEL - Abnormal; Notable for the following components:      Result Value   Sodium 134 (*)    Potassium 2.5 (*)    Glucose, Bld 103 (*)    Calcium 8.6 (*)    Albumin 3.3 (*)    All other components within normal limits  CBG MONITORING, ED - Abnormal; Notable for the following components:   Glucose-Capillary 108 (*)    All other components within normal limits  RESP PANEL BY RT-PCR (RSV, FLU A&B, COVID)  [id number]  CULTURE, BLOOD (ROUTINE X 2)  CULTURE, BLOOD (ROUTINE X 2)  CBC WITH DIFFERENTIAL/PLATELET  LIPASE, BLOOD  LACTIC ACID, PLASMA  URINALYSIS, ROUTINE W REFLEX MICROSCOPIC  LACTIC ACID, PLASMA  TROPONIN I (HIGH SENSITIVITY)  TROPONIN I (HIGH SENSITIVITY)    EKG EKG Interpretation  Date/Time:  Saturday November 15 2022 18:21:37 EST Ventricular Rate:  60 PR Interval:  130 QRS Duration: 143 QT Interval:  514 QTC Calculation: 514 R Axis:   67 Text Interpretation: Sinus rhythm LVH with IVCD and secondary repol abnrm Prolonged QT interval Confirmed by Dene Gentry [phone]) on 11/15/2022 6:27:31 PM  Radiology DG Chest Port 1 View  Result Date: 11/15/2022 CLINICAL DATA:  Weakness, failure to thrive EXAM: PORTABLE CHEST 1 VIEW COMPARISON:  10/31/2022 FINDINGS: Suture line in the left upper lobe. Associated volume loss in left hemithorax. Right lung is clear. No pneumothorax. The heart is normal in size.  Surgical clips in the right chest wall/axilla. Old left rib fracture deformities. IMPRESSION: No evidence of acute cardiopulmonary disease. Electronically Signed   By: Julian Hy M.D.   On: 11/15/2022 17:55    Procedures Procedures    Medications Ordered in ED Medications  potassium chloride 10 mEq in 100 mL IVPB (10 mEq Intravenous New Bag/Given 11/15/22 1902)    ED Course/ Medical Decision Making/ A&P  Medical Decision Making Amount and/or Complexity of Data Reviewed Labs: ordered. Radiology: ordered.  Risk Prescription drug management. Decision regarding hospitalization.    Medical Screen Complete  This patient presented to the ED with complaint of decreased PO intake.  This complaint involves an extensive number of treatment options. The initial differential diagnosis includes, but is not limited to, polypharmacy, dehydration, metabolic abnormality, etc.  This presentation is: Acute, Chronic, Self-Limited, Previously Undiagnosed, Uncertain Prognosis, Complicated, Systemic Symptoms, and Threat to Life/Bodily Function  Presents with complaint of decreased p.o. intake and generalized weakness.  Patient is nonfocal on exam.  Labs reveal hypokalemia.  Patient's repletion begun here in the ED.  Case discussed with Dr. Alcario Drought (hospitalist) - who requests MRI Brain to RO CVA.   Additional history obtained:  External records from outside sources obtained and reviewed including prior ED visits and prior Inpatient records.    Lab Tests:  I ordered and personally interpreted labs.  The pertinent results include: CBC, CMP, troponin, lipase, CBG, COVID, flu, UA   Imaging Studies ordered:  I ordered imaging studies including CT Head, CXR  I independently visualized and interpreted obtained imaging which showed NAD I agree with the radiologist interpretation.   Cardiac Monitoring:  The patient was maintained on a cardiac monitor.  I  personally viewed and interpreted the cardiac monitor which showed an underlying rhythm of: NSR   Medicines ordered:  I ordered medication including potassium, IVF  for suspected dehydration  Reevaluation of the patient after these medicines showed that the patient: stayed the same   Problem List / ED Course:  Weakness, hypokalemia   Reevaluation:  After the interventions noted above, I reevaluated the patient and found that they have: improved   Disposition:  After consideration of the diagnostic results and the patients response to treatment, I feel that the patent would benefit from admission.          Final Clinical Impression(s) / ED Diagnoses Final diagnoses:  Hypokalemia    Rx / DC Orders ED Discharge Orders     None         Valarie Merino, MD 11/15/22 [phone]

## 2022-11-16 ENCOUNTER — Encounter (HOSPITAL_COMMUNITY): Payer: Self-pay | Admitting: Internal Medicine

## 2022-11-16 ENCOUNTER — Observation Stay (HOSPITAL_COMMUNITY): Payer: Medicare Other

## 2022-11-16 DIAGNOSIS — G8929 Other chronic pain: Secondary | ICD-10-CM | POA: Diagnosis not present

## 2022-11-16 DIAGNOSIS — G9340 Encephalopathy, unspecified: Secondary | ICD-10-CM | POA: Diagnosis not present

## 2022-11-16 DIAGNOSIS — E876 Hypokalemia: Secondary | ICD-10-CM | POA: Diagnosis not present

## 2022-11-16 DIAGNOSIS — E119 Type 2 diabetes mellitus without complications: Secondary | ICD-10-CM | POA: Diagnosis not present

## 2022-11-16 DIAGNOSIS — I10 Essential (primary) hypertension: Secondary | ICD-10-CM

## 2022-11-16 DIAGNOSIS — I6523 Occlusion and stenosis of bilateral carotid arteries: Secondary | ICD-10-CM | POA: Diagnosis not present

## 2022-11-16 DIAGNOSIS — E785 Hyperlipidemia, unspecified: Secondary | ICD-10-CM

## 2022-11-16 LAB — CBC
HCT: 39.3 % (ref 36.0–46.0)
Hemoglobin: 13.3 g/dL (ref 12.0–15.0)
MCH: 31 pg (ref 26.0–34.0)
MCHC: 33.8 g/dL (ref 30.0–36.0)
MCV: 91.6 fL (ref 80.0–100.0)
Platelets: 263 10*3/uL (ref 150–400)
RBC: 4.29 MIL/uL (ref 3.87–5.11)
RDW: 14.3 % (ref 11.5–15.5)
WBC: 8.2 10*3/uL (ref 4.0–10.5)
nRBC: 0 % (ref 0.0–0.2)

## 2022-11-16 LAB — URINALYSIS, COMPLETE (UACMP) WITH MICROSCOPIC
Bacteria, UA: NONE SEEN
Bilirubin Urine: NEGATIVE
Glucose, UA: NEGATIVE mg/dL
Ketones, ur: NEGATIVE mg/dL
Nitrite: NEGATIVE
Protein, ur: NEGATIVE mg/dL
Specific Gravity, Urine: 1.008 (ref 1.005–1.030)
WBC, UA: >50 WBC/hpf (ref 0–5)
pH: 8 (ref 5.0–8.0)

## 2022-11-16 LAB — TSH: TSH: 1.173 u[IU]/mL (ref 0.350–4.500)

## 2022-11-16 LAB — RAPID URINE DRUG SCREEN, HOSP PERFORMED
Amphetamines: NOT DETECTED
Barbiturates: NOT DETECTED
Benzodiazepines: NOT DETECTED
Cocaine: NOT DETECTED
Opiates: POSITIVE — AB
Tetrahydrocannabinol: NOT DETECTED

## 2022-11-16 LAB — BASIC METABOLIC PANEL
Anion gap: 12 (ref 5–15)
BUN: 10 mg/dL (ref 8–23)
CO2: 23 mmol/L (ref 22–32)
Calcium: 8.6 mg/dL — ABNORMAL LOW (ref 8.9–10.3)
Chloride: 101 mmol/L (ref 98–111)
Creatinine, Ser: 0.57 mg/dL (ref 0.44–1.00)
GFR, Estimated: >60 mL/min (ref >60)
Glucose, Bld: 92 mg/dL (ref 70–99)
Potassium: 3.1 mmol/L — ABNORMAL LOW (ref 3.5–5.1)
Sodium: 136 mmol/L (ref 135–145)

## 2022-11-16 LAB — MAGNESIUM: Magnesium: 1.8 mg/dL (ref 1.7–2.4)

## 2022-11-16 LAB — LACTIC ACID, PLASMA: Lactic Acid, Venous: 1.5 mmol/L (ref 0.5–1.9)

## 2022-11-16 LAB — TROPONIN I (HIGH SENSITIVITY): Troponin I (High Sensitivity): 11 ng/L (ref <18)

## 2022-11-16 MED ORDER — LOSARTAN POTASSIUM 50 MG PO TABS
50.0000 mg | ORAL_TABLET | Freq: Every day | ORAL | Status: DC
  Administered 2022-11-16 – 2022-11-19 (×4): 50 mg via ORAL
  Filled 2022-11-16 (×4): qty 1

## 2022-11-16 MED ORDER — STROKE: EARLY STAGES OF RECOVERY BOOK
Freq: Once | Status: AC
  Filled 2022-11-16: qty 1

## 2022-11-16 MED ORDER — ONDANSETRON HCL 4 MG PO TABS: 4.0000 mg | ORAL_TABLET | Freq: Four times a day (QID) | ORAL | Status: DC | PRN

## 2022-11-16 MED ORDER — ENOXAPARIN SODIUM 40 MG/0.4ML IJ SOSY
40.0000 mg | PREFILLED_SYRINGE | INTRAMUSCULAR | Status: DC
  Administered 2022-11-16 – 2022-11-19 (×4): 40 mg via SUBCUTANEOUS
  Filled 2022-11-16 (×4): qty 0.4

## 2022-11-16 MED ORDER — POTASSIUM CHLORIDE 10 MEQ/100ML IV SOLN
INTRAVENOUS | Status: AC
  Filled 2022-11-16: qty 100

## 2022-11-16 MED ORDER — LACTATED RINGERS IV SOLN: INTRAVENOUS | Status: DC

## 2022-11-16 MED ORDER — HYDRALAZINE HCL 20 MG/ML IJ SOLN
5.0000 mg | INTRAMUSCULAR | Status: DC | PRN
  Administered 2022-11-16: 10 mg via INTRAVENOUS
  Filled 2022-11-16: qty 1

## 2022-11-16 MED ORDER — ONDANSETRON HCL 4 MG/2ML IJ SOLN: 4.0000 mg | Freq: Four times a day (QID) | INTRAMUSCULAR | Status: DC | PRN

## 2022-11-16 MED ORDER — ACETAMINOPHEN 325 MG PO TABS
650.0000 mg | ORAL_TABLET | Freq: Four times a day (QID) | ORAL | Status: DC | PRN
  Administered 2022-11-18: 650 mg via ORAL
  Filled 2022-11-16: qty 2

## 2022-11-16 MED ORDER — ACETAMINOPHEN 650 MG RE SUPP: 650.0000 mg | Freq: Four times a day (QID) | RECTAL | Status: DC | PRN

## 2022-11-16 MED ORDER — IOHEXOL 350 MG/ML SOLN
75.0000 mL | Freq: Once | INTRAVENOUS | Status: AC | PRN
  Administered 2022-11-16: 75 mL via INTRAVENOUS

## 2022-11-16 NOTE — Plan of Care (Signed)
  Problem: Education: Goal: Knowledge of General Education information will improve Description: Including pain rating scale, medication(s)/side effects and non-pharmacologic comfort measures Outcome: Progressing   Problem: Nutrition: Goal: Adequate nutrition will be maintained Outcome: Progressing   Problem: Coping: Goal: Level of anxiety will decrease Outcome: Progressing   

## 2022-11-16 NOTE — H&P (Addendum)
History and Physical    Patient: [name] DOB: 22-Mar-1946 DOA: 11/15/2022 DOS: the patient was seen and examined on 11/16/2022 PCP: [name], FNP  Patient coming from: Home  Chief Complaint:  Chief Complaint  Patient presents with   Failure To Thrive   HPI: [name] is a 77 y.o. female with medical history significant of CAD, HTN, HLD, stroke, chronic pain on chronic opiates.  Pt with admission in Nov for AMS + AKI, felt to be due to polypharmacy including opiates.  Pt with admission in Dec for multifocal acute strokes, felt by neurology to be due to CVD and hypotension in setting of polypharmacy including opiates.  Pt with admission 2 weeks ago in Feb for AMS / lethargy and confusion which was, once again, felt to be due to ongoing polypharmacy.  She was ultimately restarted on a reduced dose of pain meds for her chronic pain at time of discharge.  Since discharge, her husband reports that she went back to using exact same dosing that was felt to be the cause of her AMS prior to her admission 2 weeks ago.  And he reports that she has not reduced her pain medication usage, as instructed, at all.  For the past 4 days she has been lethargic at home.  Ultimately sent in to ER for lethargy and confusion.  Patient reports that she feels weak. She is otherwise without complaint. She denies chest pain, shortness of breath, nausea, vomiting, abdominal pain, fevers.   She states she is "not sure" how much pain medication she is taking at home (is clearly confused at this time).   Review of Systems: As mentioned in the history of present illness. All other systems reviewed and are negative. Past Medical History:  Diagnosis Date   Asthma    Breast cancer ([place])    right breast   COPD (chronic obstructive pulmonary disease) (HCC)    Coronary artery disease    Diverticulum of esophagus    Elevated LFTs    Emphysema lung (HCC)    ETOH abuse    H/O atrial  fibrillation without current medication    only one time when she had sepsis   Hyperlipidemia    Hypertension    hx of but not on any medications   Myocardial infarction ([place]) 2000   OA (osteoarthritis) of knee    Osteoarthritis    Tobacco abuse    Past Surgical History:  Procedure Laterality Date   ANTERIOR HIP REVISION Right 10/22/2015   Procedure: RIGHT  HIP REVISION;  Surgeon: Paralee Cancel, MD;  Location: [facility];  Service: Orthopedics;  Laterality: Right;   APPLICATION OF WOUND VAC N/A 06/20/2015   Procedure: APPLICATION OF INCISIONAL WOUND VAC;  Surgeon: Melina Schools, MD;  Location: [place];  Service: Orthopedics;  Laterality: N/A;   BREAST SURGERY  1991   right mastectomy   CARDIAC CATHETERIZATION  04/05/2009   EF 60%   CARDIOVASCULAR STRESS TEST  01/31/2005   EF 58%   CESAREAN SECTION  '78, '80, '81   x 3   CORONARY ANGIOPLASTY  08/1998   x2 OF A BIFURCATION OM-1, OM-2 LESION   CORONARY ANGIOPLASTY WITH STENT PLACEMENT  01/1999   MID FIRST OBTUSE MARGINAL VESSEL   CORONARY ANGIOPLASTY WITH STENT PLACEMENT  07/1999   STENTING AT THE CRUX OF THE RIGHT CORONARY ARTERY WITH A 3.8MM X 18MM TETRA STENT   DIRECT LARYNGOSCOPY N/A 05/03/2015   Procedure: DIRECT LARYNGOSCOPY;  Surgeon:  Jodi Marble, MD;  Location: [place];  Service: ENT;  Laterality: N/A;   EYE SURGERY  05/18/2014,06/01/2014   BILATERAL CATARACT S WITH LENS IMPLANTS   GASTROSTOMY N/A 05/04/2015   Procedure: OPEN GASTROSTOMY WITH TUBE PLACEMENT;  Surgeon: Donnie Mesa, MD;  Location: [place];  Service: General;  Laterality: N/A;   GASTROSTOMY N/A 11/13/2016   Procedure: INSERTION OF GASTROSTOMY TUBE;  Surgeon: Coralie Keens, MD;  Location: [place];  Service: General;  Laterality: N/A;   HARDWARE REMOVAL N/A 05/03/2015   Procedure: HARDWARE REMOVAL;  Surgeon: Melina Schools, MD;  Location: [place];  Service: Orthopedics;  Laterality: N/A;   HIP CLOSED REDUCTION Right 04/26/2015   Procedure: CLOSED REDUCTION HIP;  Surgeon: Melina Schools, MD;  Location: [facility];  Service: Orthopedics;  Laterality: Right;   HYSTEROSCOPY     D & C   INCISION AND DRAINAGE ABSCESS N/A 05/03/2015   Procedure: INCISION AND DRAINAGE CERVICAL  ABSCESS AND REMOVAL OF HARDWARE;  Surgeon: Melina Schools, MD;  Location: [place];  Service: Orthopedics;  Laterality: N/A;   IR CM INJ ANY COLONIC TUBE W/FLUORO  10/14/2017   IR CM INJ ANY COLONIC TUBE W/FLUORO  10/23/2017   IR CM INJ ANY COLONIC TUBE W/FLUORO  10/28/2017   IR REPLC GASTRO/COLONIC TUBE PERCUT W/FLUORO  09/22/2017   JOINT REPLACEMENT  08/2011   bilateral hip   JOINT REPLACEMENT  01/2012   right hip   MASTECTOMY     neck fusion  2011   ORIF FEMUR FRACTURE Right 06/14/2019   Procedure: ORIF PERI PROSTHETIC FEMUR FRACTURE;  Surgeon: Paralee Cancel, MD;  Location: [place];  Service: Orthopedics;  Laterality: Right;   PELVIC LAPAROSCOPY  2002   RSO-     RADICAL NECK DISSECTION N/A 11/08/2016   Procedure: INCISION AND DRAINAGE OF NECK ABSCESS;  Surgeon: Jerrell Belfast, MD;  Location: [place];  Service: ENT;  Laterality: N/A;   RADIOLOGY WITH ANESTHESIA Right 06/28/2015   Procedure: MRI OF CERVICAL SPINE  AND RIGHT HIP  WITH AND WITHOUT CONTRAST    (RADIOLOGY WITH ANESTHESIA);  Surgeon: Medication Radiologist, MD;  Location: [place];  Service: Radiology;  Laterality: Right;   REMOVAL OF GASTROSTOMY TUBE N/A 11/14/2016   Procedure: REMOVAL OF GASTROSTOMY TUBE W/ REPLACEMENT OF GASTROSTOMY TUBE;  Surgeon: Coralie Keens, MD;  Location: [place];  Service: General;  Laterality: N/A;   RIGID ESOPHAGOSCOPY N/A 05/03/2015   Procedure: RIGID ESOPHAGOSCOPY;  Surgeon: Jodi Marble, MD;  Location: [place];  Service: ENT;  Laterality: N/A;   TONSILLECTOMY AND ADENOIDECTOMY     TOTAL HIP ARTHROPLASTY  08/2010   bilat   VULVECTOMY  1981   partial   Social History:  reports that she has been smoking cigarettes. She has been smoking an average of 1 pack per day. She has never used smokeless tobacco. She reports that she  does not drink alcohol and does not use drugs.  Allergies  Allergen Reactions   Cefuroxime Other (See Comments)    Oral ulcers   Cephalexin Other (See Comments)    Took off first layer of skin inside of mouth   Chlorhexidine Other (See Comments)    Mouth broke out- oral ulcers   Prednisone Other (See Comments)    Possible oral ulcers   Zithromax [Azithromycin Dihydrate] Other (See Comments)    ORAL ULCERS     Family History  Problem Relation Age of Onset   Diabetes Mother    Hypertension Father    Heart  disease Father    Heart attack Father    Stroke Father     Prior to Admission medications   Medication Sig Start Date End Date Taking? Authorizing Provider  albuterol (PROVENTIL HFA;VENTOLIN HFA) 108 (90 BASE) MCG/ACT inhaler Inhale 2 puffs into the lungs every 6 (six) hours as needed for wheezing or shortness of breath.    [provider]  ascorbic acid (VITAMIN C) 500 MG tablet Take 500 mg by mouth daily.    [provider]  BIOTIN EXTRA STRENGTH PO Take 500 mg by mouth daily.    [provider]  cholecalciferol (VITAMIN D3) 25 MCG (1000 UNIT) tablet Take 1,000 Units by mouth daily.    [provider]  diclofenac (VOLTAREN) 75 MG EC tablet Take 75 mg by mouth 2 (two) times daily as needed for mild pain.    [provider]  furosemide (LASIX) 20 MG tablet Take 1 tablet (20 mg total) by mouth daily as needed for edema. Patient taking differently: Take 20 mg by mouth daily. 12/27/19 09/07/23  Martinique, Peter M, MD  gabapentin (NEURONTIN) 600 MG tablet Take 600 mg by mouth 4 (four) times daily.     [provider]  losartan (COZAAR) 50 MG tablet Take 50 mg by mouth daily.    [provider]  metFORMIN (GLUCOPHAGE-XR) 500 MG 24 hr tablet Take 500 mg by mouth every evening.    [provider]  methocarbamol (ROBAXIN) 500 MG tablet Take 500 mg by mouth daily as needed for muscle spasms. 09/05/22   [provider]  morphine (MS CONTIN) 30 MG 12 hr tablet Take 30 mg by mouth 4 (four) times daily.  12/23/16   [provider]  Multiple Vitamins-Minerals (CENTRUM SILVER 50+WOMEN) TABS Take 1 tablet by mouth 2 (two) times a week.    [provider]  oxyCODONE (ROXICODONE) 15 MG immediate release tablet Take 15 mg by mouth 4 (four) times daily.    [provider]  rosuvastatin (CRESTOR) 10 MG tablet TAKE 1 TABLET BY MOUTH EVERY DAY 10/23/22   Martinique, Peter M, MD  TYLENOL 500 MG tablet Take 1,000 mg by mouth every 6 (six) hours as needed for headache or mild pain.    [provider]    Physical Exam: Vitals:   11/15/22 2035 11/15/22 2245 11/16/22 0100 11/16/22 0102  BP:  (!) 186/87 (!) 190/92   Pulse:  61 (!) 59   Resp:  18 (!) 27   Temp: 98.1 F (36.7 C)   97.6 F (36.4 C)  TempSrc: Oral   Oral  SpO2:  98% 94%    Constitutional: NAD, calm, comfortable Respiratory: clear to auscultation bilaterally, no wheezing, no crackles. Normal respiratory effort. No accessory muscle use.  Cardiovascular: Regular rate and rhythm, no murmurs / rubs / gallops. No extremity edema. 2+ pedal pulses. No carotid bruits.  Abdomen: no tenderness, no masses palpated. No hepatosplenomegaly. Bowel sounds positive.  Neurologic: CN 2-12 grossly intact. Sensation intact, DTR normal. Strength 5/5 in all 4.  Psychiatric: Somewhat confused, oriented to self, location.  Not situation, not how much pain meds she has taken recently.  Data Reviewed:      Assessment and Plan: * Acute encephalopathy Pt returns to ED with lethargy and AMS. Given the HPI discussed above, as well as the fact that her MRI today is negative for acute stroke (just shows small subacute stroke), I am most suspicious that this, once again, is probably secondary to polypharmacy.  Hold Oxycodone, MS Contin, neurontin, and robaxin We will of course continue to work up other possible causes in-case there is more  going on: Check TSH Check UA Check UDS Observe for development of SIRS Check Mg Patients husband also suspects that polypharm is once again the issue, has asked if we can let Dr. Nelva Bush know our suspicions (Dr. Nelva Bush is who is prescribing her narcotics). Ill put in a message to day team that they should consider contacting him about this (Does HIPAA allow this?) Replace K, as this may also be contributing to AMS Repeat BMP in AM  Chronic pain Hold all home pain meds given that we, once again, suspect her AMS and lethergy may be due to polypharmacy.  DM2 (diabetes mellitus, type 2) (HCC) Hold metformin Sensitive SSI Q4H for the moment.  Essential hypertension Cont home Losartan Adding PRN hydralazine  Hyperlipidemia Cont crestor.      Advance Care Planning:   Code Status: Full Code  Consults: None  Family Communication: Discussed with husband on phone  Severity of Illness: The appropriate patient status for this patient is OBSERVATION. Observation status is judged to be reasonable and necessary in order to provide the required intensity of service to ensure the patient's safety. The patient's presenting symptoms, physical exam findings, and initial radiographic and laboratory data in the context of their medical condition is felt to place them at decreased risk for further clinical deterioration. Furthermore, it is anticipated that the patient will be medically stable for discharge from the hospital within 2 midnights of admission.   Author: Etta Quill., DO 11/16/2022 2:28 AM  For on call review [email or web address].

## 2022-11-16 NOTE — Assessment & Plan Note (Signed)
Hold all home pain meds given that we, once again, suspect her AMS and lethergy may be due to polypharmacy.

## 2022-11-16 NOTE — ED Notes (Addendum)
Mittens placed on pt to attempt to keep monitoring equipment on patient and IV intact for remaining potassium runs

## 2022-11-16 NOTE — Assessment & Plan Note (Addendum)
Pt returns to ED with lethargy and AMS. Given the HPI discussed above, as well as the fact that her MRI today is negative for acute stroke (just shows small subacute stroke), I am most suspicious that this, once again, is probably secondary to polypharmacy. Hold Oxycodone, MS Contin, neurontin, and robaxin We will of course continue to work up other possible causes in-case there is more going on: Check TSH Check UA Check UDS Observe for development of SIRS Check Mg Patients husband also suspects that polypharm is once again the issue, has asked if we can let Dr. Nelva Bush know our suspicions (Dr. Nelva Bush is who is prescribing her narcotics). Ill put in a message to day team that they should consider contacting him about this (Does HIPAA allow this?) Replace K, as this may also be contributing to AMS Repeat BMP in AM

## 2022-11-16 NOTE — Progress Notes (Signed)
Mobility Specialist - Progress Note   11/16/22 1333  Mobility  Activity Dangled on edge of bed  Level of Assistance Minimal assist, patient does 75% or more  Range of Motion/Exercises Active  Activity Response Tolerated fair  $Mobility charge 1 Mobility   Pt was found in bed and agreeable to try in room ambulation. Was min-A for bed mobility and once sitting EOB stated wanting to go back in bed due to her husband being on the way. Encouraged pt to still try ambulation in room or trying to sit up for lunch but she refused. At EOS was left in bed with necessities.  Ferd Hibbs Mobility Specialist

## 2022-11-16 NOTE — ED Notes (Addendum)
ED TO INPATIENT HANDOFF REPORT  ED Nurse Name and Phone #: Seniah Lawrence, Paramedic  S Name/Age/Gender [name] 77 y.o. female Room/Bed: WA24/WA24  Code Status   Code Status: Full Code  Home/SNF/Other  Patient oriented to: self Is this baseline?   Triage Complete: Triage complete  Chief Complaint Acute encephalopathy [G93.40]  Triage Note Pt arrived via EMS from home for failure to thrive. Pt has not eaten or had meds in 4 plus days.   BP 180/92 HR 70 RR 20 O2 98%  CBG 103    Allergies Allergies  Allergen Reactions   Cefuroxime Other (See Comments)    Oral ulcers   Cephalexin Other (See Comments)    Took off first layer of skin inside of mouth   Chlorhexidine Other (See Comments)    Mouth broke out- oral ulcers   Prednisone Other (See Comments)    Possible oral ulcers   Zithromax [Azithromycin Dihydrate] Other (See Comments)    ORAL ULCERS     Level of Care/Admitting Diagnosis ED Disposition     ED Disposition  Admit   Condition  --   Comment  Hospital Area: [facility] [[id number]]  Level of Care: Med-Surg [16]  May place patient in observation at [facility] or [facility] if equivalent level of care is available:: No  Covid Evaluation: Asymptomatic - no recent exposure (last 10 days) testing not required  Diagnosis: Acute encephalopathy [id number]  Admitting Physician: Etta Quill [id number]  Attending Physician: Etta Quill [phone]          B Medical/Surgery History Past Medical History:  Diagnosis Date   Asthma    Breast cancer ([place])    right breast   COPD (chronic obstructive pulmonary disease) ([place])    Coronary artery disease    Diverticulum of esophagus    Elevated LFTs    Emphysema lung ([place])    ETOH abuse    H/O atrial fibrillation without current medication    only one time when she had sepsis   Hyperlipidemia    Hypertension    hx of but not on any medications   Myocardial infarction ([place]) 2000    OA (osteoarthritis) of knee    Osteoarthritis    Tobacco abuse    Past Surgical History:  Procedure Laterality Date   ANTERIOR HIP REVISION Right 10/22/2015   Procedure: RIGHT  HIP REVISION;  Surgeon: Paralee Cancel, MD;  Location: [facility];  Service: Orthopedics;  Laterality: Right;   APPLICATION OF WOUND VAC N/A 06/20/2015   Procedure: APPLICATION OF INCISIONAL WOUND VAC;  Surgeon: Melina Schools, MD;  Location: [facility];  Service: Orthopedics;  Laterality: N/A;   BREAST SURGERY  1991   right mastectomy   CARDIAC CATHETERIZATION  04/05/2009   EF 60%   CARDIOVASCULAR STRESS TEST  01/31/2005   EF 58%   CESAREAN SECTION  '78, '80, '81   x 3   CORONARY ANGIOPLASTY  08/1998   x2 OF A BIFURCATION OM-1, OM-2 LESION   CORONARY ANGIOPLASTY WITH STENT PLACEMENT  01/1999   MID FIRST OBTUSE MARGINAL VESSEL   CORONARY ANGIOPLASTY WITH STENT PLACEMENT  07/1999   STENTING AT THE CRUX OF THE RIGHT CORONARY ARTERY WITH A 3.8MM X 18MM TETRA STENT   DIRECT LARYNGOSCOPY N/A 05/03/2015   Procedure: DIRECT LARYNGOSCOPY;  Surgeon: Jodi Marble, MD;  Location: [facility];  Service: ENT;  Laterality: N/A;   EYE SURGERY  05/18/2014,06/01/2014   BILATERAL CATARACT S WITH LENS IMPLANTS  GASTROSTOMY N/A 05/04/2015   Procedure: OPEN GASTROSTOMY WITH TUBE PLACEMENT;  Surgeon: Donnie Mesa, MD;  Location: [facility];  Service: General;  Laterality: N/A;   GASTROSTOMY N/A 11/13/2016   Procedure: INSERTION OF GASTROSTOMY TUBE;  Surgeon: Coralie Keens, MD;  Location: [facility];  Service: General;  Laterality: N/A;   HARDWARE REMOVAL N/A 05/03/2015   Procedure: HARDWARE REMOVAL;  Surgeon: Melina Schools, MD;  Location: [facility];  Service: Orthopedics;  Laterality: N/A;   HIP CLOSED REDUCTION Right 04/26/2015   Procedure: CLOSED REDUCTION HIP;  Surgeon: Melina Schools, MD;  [facility];  Service: Orthopedics;  Laterality: Right;   HYSTEROSCOPY     D & C   INCISION AND DRAINAGE ABSCESS N/A 05/03/2015   Procedure: INCISION AND DRAINAGE  CERVICAL  ABSCESS AND REMOVAL OF HARDWARE;  Surgeon: Melina Schools, MD;  Location: [facility];  Service: Orthopedics;  Laterality: N/A;   IR CM INJ ANY COLONIC TUBE W/FLUORO  10/14/2017   IR CM INJ ANY COLONIC TUBE W/FLUORO  10/23/2017   IR CM INJ ANY COLONIC TUBE W/FLUORO  10/28/2017   IR REPLC GASTRO/COLONIC TUBE PERCUT W/FLUORO  09/22/2017   JOINT REPLACEMENT  08/2011   bilateral hip   JOINT REPLACEMENT  01/2012   right hip   MASTECTOMY     neck fusion  2011   ORIF FEMUR FRACTURE Right 06/14/2019   Procedure: ORIF PERI PROSTHETIC FEMUR FRACTURE;  Surgeon: Paralee Cancel, MD;  Location: [facility];  Service: Orthopedics;  Laterality: Right;   PELVIC LAPAROSCOPY  2002   RSO-     RADICAL NECK DISSECTION N/A 11/08/2016   Procedure: INCISION AND DRAINAGE OF NECK ABSCESS;  Surgeon: Jerrell Belfast, MD;  Location: [facility];  Service: ENT;  Laterality: N/A;   RADIOLOGY WITH ANESTHESIA Right 06/28/2015   Procedure: MRI OF CERVICAL SPINE  AND RIGHT HIP  WITH AND WITHOUT CONTRAST    (RADIOLOGY WITH ANESTHESIA);  Surgeon: Medication Radiologist, MD;  Location: [facility];  Service: Radiology;  Laterality: Right;   REMOVAL OF GASTROSTOMY TUBE N/A 11/14/2016   Procedure: REMOVAL OF GASTROSTOMY TUBE W/ REPLACEMENT OF GASTROSTOMY TUBE;  Surgeon: Coralie Keens, MD;  Location: [facility];  Service: General;  Laterality: N/A;   RIGID ESOPHAGOSCOPY N/A 05/03/2015   Procedure: RIGID ESOPHAGOSCOPY;  Surgeon: Jodi Marble, MD;  Location: [facility];  Service: ENT;  Laterality: N/A;   TONSILLECTOMY AND ADENOIDECTOMY     TOTAL HIP ARTHROPLASTY  08/2010   bilat   VULVECTOMY  1981   partial     A IV Location/Drains/Wounds Patient Lines/Drains/Airways Status     Active Line/Drains/Airways     Name Placement date Placement time Site Days   Peripheral IV 11/15/22 20 G 1" Anterior;Right Forearm 11/15/22  2342  Forearm  1            Intake/Output Last 24 hours  Intake/Output Summary (Last 24 hours) at 11/16/2022 0738 Last data filed  at 11/16/2022 0546 Gross per 24 hour  Intake 200 ml  Output --  Net 200 ml    Labs/Imaging Results for orders placed or performed during the hospital encounter of 11/15/22 (from the past 48 hour(s))  CBC with Differential     Status: None   Collection Time: 11/15/22  5:52 PM  Result Value Ref Range   WBC 8.1 4.0 - 10.5 K/uL   RBC 4.28 3.87 - 5.11 MIL/uL   Hemoglobin 13.3 12.0 - 15.0 g/dL   HCT 38.2 36.0 - 46.0 %   MCV 89.3  80.0 - 100.0 fL   MCH 31.1 26.0 - 34.0 pg   MCHC 34.8 30.0 - 36.0 g/dL   RDW 14.1 11.5 - 15.5 %   Platelets 262 150 - 400 K/uL   nRBC 0.0 0.0 - 0.2 %   Neutrophils Relative % 73 %   Neutro Abs 5.9 1.7 - 7.7 K/uL   Lymphocytes Relative 19 %   Lymphs Abs 1.5 0.7 - 4.0 K/uL   Monocytes Relative 7 %   Monocytes Absolute 0.6 0.1 - 1.0 K/uL   Eosinophils Relative 0 %   Eosinophils Absolute 0.0 0.0 - 0.5 K/uL   Basophils Relative 1 %   Basophils Absolute 0.0 0.0 - 0.1 K/uL   Immature Granulocytes 0 %   Abs Immature Granulocytes 0.02 0.00 - 0.07 K/uL    Comment: Performed at [facility], [place] [address] [place], [place] [address]  Resp panel by RT-PCR (RSV, Flu A&B, Covid) Anterior Nasal Swab     Status: None   Collection Time: 11/15/22  5:52 PM   Specimen: Anterior Nasal Swab  Result Value Ref Range   SARS Coronavirus 2 by RT PCR NEGATIVE NEGATIVE    Comment: (NOTE) SARS-CoV-2 target nucleic acids are NOT DETECTED.  The SARS-CoV-2 RNA is generally detectable in upper respiratory specimens during the acute phase of infection. The lowest concentration of SARS-CoV-2 viral copies this assay can detect is 138 copies/mL. A negative result does not preclude SARS-Cov-2 infection and should not be used as the sole basis for treatment or other patient management decisions. A negative result may occur with  improper specimen collection/handling, submission of specimen other than nasopharyngeal swab, presence of viral mutation(s) within the areas  targeted by this assay, and inadequate number of viral copies(<138 copies/mL). A negative result must be combined with clinical observations, patient history, and epidemiological information. The expected result is Negative.  Fact Sheet for Patients:  EntrepreneurPulse.com.au  Fact Sheet for Healthcare Providers:  IncredibleEmployment.be  This test is no t yet approved or cleared by the [place] FDA and  has been authorized for detection and/or diagnosis of SARS-CoV-2 by FDA under an Emergency Use Authorization (EUA). This EUA will remain  in effect (meaning this test can be used) for the duration of the COVID-19 declaration under Section 564(b)(1) of the Act, 21 U.S.C.section 360bbb-3(b)(1), unless the authorization is terminated  or revoked sooner.       Influenza A by PCR NEGATIVE NEGATIVE   Influenza B by PCR NEGATIVE NEGATIVE    Comment: (NOTE) The Xpert Xpress SARS-CoV-2/FLU/RSV plus assay is intended as an aid in the diagnosis of influenza from Nasopharyngeal swab specimens and should not be used as a sole basis for treatment. Nasal washings and aspirates are unacceptable for Xpert Xpress SARS-CoV-2/FLU/RSV testing.  Fact Sheet for Patients: EntrepreneurPulse.com.au  Fact Sheet for Healthcare Providers: IncredibleEmployment.be  This test is not yet approved or cleared by the [place] FDA and has been authorized for detection and/or diagnosis of SARS-CoV-2 by FDA under an Emergency Use Authorization (EUA). This EUA will remain in effect (meaning this test can be used) for the duration of the COVID-19 declaration under Section 564(b)(1) of the Act, 21 U.S.C. section 360bbb-3(b)(1), unless the authorization is terminated or revoked.     Resp Syncytial Virus by PCR NEGATIVE NEGATIVE    Comment: (NOTE) Fact Sheet for Patients: EntrepreneurPulse.com.au  Fact Sheet for  Healthcare Providers: IncredibleEmployment.be  This test is not yet approved or cleared by the [place] FDA  and has been authorized for detection and/or diagnosis of SARS-CoV-2 by FDA under an Emergency Use Authorization (EUA). This EUA will remain in effect (meaning this test can be used) for the duration of the COVID-19 declaration under Section 564(b)(1) of the Act, 21 U.S.C. section 360bbb-3(b)(1), unless the authorization is terminated or revoked.  Performed at [facility], [place] [address] [place], [place] [address]   Troponin I (High Sensitivity)     Status: None   Collection Time: 11/15/22  5:52 PM  Result Value Ref Range   Troponin I (High Sensitivity) 9 <18 ng/L    Comment: (NOTE) Elevated high sensitivity troponin I (hsTnI) values and significant  changes across serial measurements may suggest ACS but many other  chronic and acute conditions are known to elevate hsTnI results.  Refer to the "Links" section for chest pain algorithms and additional  guidance. Performed at [facility], [place] [address], [place], [place] [address]   Comprehensive metabolic panel     Status: Abnormal   Collection Time: 11/15/22  5:52 PM  Result Value Ref Range   Sodium 134 (L) 135 - 145 mmol/L   Potassium 2.5 (LL) 3.5 - 5.1 mmol/L    Comment: CRITICAL RESULT CALLED TO, READ BACK BY AND VERIFIED WITH SINCLAIR, L RN @ 1843 11/15/22. GILBERTL    Chloride 98 98 - 111 mmol/L   CO2 24 22 - 32 mmol/L   Glucose, Bld 103 (H) 70 - 99 mg/dL    Comment: Glucose reference range applies only to samples taken after fasting for at least 8 hours.   BUN 10 8 - 23 mg/dL   Creatinine, Ser 0.55 0.44 - 1.00 mg/dL   Calcium 8.6 (L) 8.9 - 10.3 mg/dL   Total Protein 7.0 6.5 - 8.1 g/dL   Albumin 3.3 (L) 3.5 - 5.0 g/dL   AST 20 15 - 41 U/L   ALT 13 0 - 44 U/L   Alkaline Phosphatase 64 38 - 126 U/L   Total Bilirubin 1.0 0.3 - 1.2 mg/dL   GFR,  Estimated >60 >60 mL/min    Comment: (NOTE) Calculated using the CKD-EPI Creatinine Equation (2021)    Anion gap 12 5 - 15    Comment: Performed at [facility], [place] [address], [place], [place] [address]  Lipase, blood     Status: None   Collection Time: 11/15/22  5:52 PM  Result Value Ref Range   Lipase 41 11 - 51 U/L    Comment: Performed at [facility], [place] [address], [place], [place] [address]  Lactic acid, plasma     Status: None   Collection Time: 11/15/22  5:52 PM  Result Value Ref Range   Lactic Acid, Venous 1.2 0.5 - 1.9 mmol/L    Comment: Performed at [facility], [place] [address], [place], [place] [address]  CBG monitoring, ED     Status: Abnormal   Collection Time: 11/15/22  6:13 PM  Result Value Ref Range   Glucose-Capillary 108 (H) 70 - 99 mg/dL    Comment: Glucose reference range applies only to samples taken after fasting for at least 8 hours.  Troponin I (High Sensitivity)     Status: None   Collection Time: 11/16/22  3:30 AM  Result Value Ref Range   Troponin I (High Sensitivity) 11 <18 ng/L    Comment: (NOTE) Elevated high sensitivity troponin I (hsTnI) values and significant  changes across serial measurements may suggest  ACS but many other  chronic and acute conditions are known to elevate hsTnI results.  Refer to the "Links" section for chest pain algorithms and additional  guidance. Performed at [facility], [place] [address] [place], [place] [address]   CBC     Status: None   Collection Time: 11/16/22  3:30 AM  Result Value Ref Range   WBC 8.2 4.0 - 10.5 K/uL   RBC 4.29 3.87 - 5.11 MIL/uL   Hemoglobin 13.3 12.0 - 15.0 g/dL   HCT 39.3 36.0 - 46.0 %   MCV 91.6 80.0 - 100.0 fL   MCH 31.0 26.0 - 34.0 pg   MCHC 33.8 30.0 - 36.0 g/dL   RDW 14.3 11.5 - 15.5 %   Platelets 263 150 - 400 K/uL   nRBC 0.0 0.0 - 0.2 %    Comment: Performed at [facility], [place]  [address], [place], [place] [id number]  Basic metabolic panel     Status: Abnormal   Collection Time: 11/16/22  3:30 AM  Result Value Ref Range   Sodium 136 135 - 145 mmol/L   Potassium 3.1 (L) 3.5 - 5.1 mmol/L   Chloride 101 98 - 111 mmol/L   CO2 23 22 - 32 mmol/L   Glucose, Bld 92 70 - 99 mg/dL    Comment: Glucose reference range applies only to samples taken after fasting for at least 8 hours.   BUN 10 8 - 23 mg/dL   Creatinine, Ser 0.57 0.44 - 1.00 mg/dL   Calcium 8.6 (L) 8.9 - 10.3 mg/dL   GFR, Estimated >60 >60 mL/min    Comment: (NOTE) Calculated using the CKD-EPI Creatinine Equation (2021)    Anion gap 12 5 - 15    Comment: Performed at [facility], [place] [address] [place], [place] [address]  TSH     Status: None   Collection Time: 11/16/22  3:30 AM  Result Value Ref Range   TSH 1.173 0.350 - 4.500 uIU/mL    Comment: Performed by a 3rd Generation assay with a functional sensitivity of <=0.01 uIU/mL. Performed at [facility], [place] [address], [place], [place] [address]   Magnesium     Status: None   Collection Time: 11/16/22  3:30 AM  Result Value Ref Range   Magnesium 1.8 1.7 - 2.4 mg/dL    Comment: Performed at [facility], [place] [address] [place], [place] [address]  Urinalysis, Complete w Microscopic -Urine, Clean Catch     Status: Abnormal   Collection Time: 11/16/22  4:15 AM  Result Value Ref Range   Color, Urine YELLOW YELLOW   APPearance CLOUDY (A) CLEAR   Specific Gravity, Urine 1.008 1.005 - 1.030   pH 8.0 5.0 - 8.0   Glucose, UA NEGATIVE NEGATIVE mg/dL   Hgb urine dipstick SMALL (A) NEGATIVE   Bilirubin Urine NEGATIVE NEGATIVE   Ketones, ur NEGATIVE NEGATIVE mg/dL   Protein, ur NEGATIVE NEGATIVE mg/dL   Nitrite NEGATIVE NEGATIVE   Leukocytes,Ua LARGE (A) NEGATIVE   RBC / HPF 0-5 0 - 5 RBC/hpf   WBC, UA >50 0 - 5 WBC/hpf   Bacteria, UA NONE SEEN NONE SEEN   Squamous Epithelial / HPF 0-5 0 - 5 /HPF    WBC Clumps PRESENT    Mucus PRESENT     Comment: Performed at [facility], [place] [address] [place], [place] [address]  Rapid urine drug screen (hospital performed)  Status: Abnormal   Collection Time: 11/16/22  4:15 AM  Result Value Ref Range   Opiates POSITIVE (A) NONE DETECTED   Cocaine NONE DETECTED NONE DETECTED   Benzodiazepines NONE DETECTED NONE DETECTED   Amphetamines NONE DETECTED NONE DETECTED   Tetrahydrocannabinol NONE DETECTED NONE DETECTED   Barbiturates NONE DETECTED NONE DETECTED    Comment: (NOTE) DRUG SCREEN FOR MEDICAL PURPOSES ONLY.  IF CONFIRMATION IS NEEDED FOR ANY PURPOSE, NOTIFY LAB WITHIN 5 DAYS.  LOWEST DETECTABLE LIMITS FOR URINE DRUG SCREEN Drug Class                     Cutoff (ng/mL) Amphetamine and metabolites    1000 Barbiturate and metabolites    200 Benzodiazepine                 200 Opiates and metabolites        300 Cocaine and metabolites        300 THC                            50 Performed at [facility], [place] [address] [place], [place] [address]    MR BRAIN WO CONTRAST  Result Date: 11/16/2022 CLINICAL DATA:  Initial evaluation for altered mental status. EXAM: MRI HEAD WITHOUT CONTRAST TECHNIQUE: Multiplanar, multiecho pulse sequences of the brain and surrounding structures were obtained without intravenous contrast. COMPARISON:  Prior CT from 11/15/2022. FINDINGS: Brain: Examination moderately to severely degraded by motion artifact. Generalized age-related cerebral atrophy. Few scattered remote bilateral cerebellar infarcts noted. Chronic microvascular ischemic disease noted within the pons. Few scattered patchy subcentimeter foci of diffusion signal abnormality are seen involving the right frontal centrum semi ovale (series 5, images 25, 26). Associated T2/FLAIR signal without visible signal loss on ADC map. Findings likely reflect small evolving subacute small vessel type infarcts. No  associated hemorrhage or mass effect. No other evidence for acute or recent infarction. Gray-white matter differentiation otherwise maintained. No other visible areas of chronic cortical infarction. No visible acute or chronic intracranial blood products. No mass lesion, midline shift or mass effect. No hydrocephalus or extra-axial fluid collection. Vascular: Major intracranial vascular flow voids are maintained. Skull and upper cervical spine: Bone marrow signal intensity grossly within normal limits. No scalp soft tissue abnormality. Sinuses/Orbits: Prior bilateral ocular lens replacement. Paranasal sinuses are clear. No significant mastoid effusion. Other: None. IMPRESSION: 1. Motion degraded exam. 2. Few scattered subcentimeter foci of diffusion signal abnormality involving the right frontal centrum semi ovale, likely reflecting small evolving subacute small vessel type infarcts. No associated hemorrhage or mass effect. 3. Underlying age-related cerebral atrophy with mild chronic small vessel ischemic disease, with a few scattered remote bilateral cerebellar infarcts. Electronically Signed   By: Jeannine Boga M.D.   On: 11/16/2022 01:04   DG Abdomen 1 View  Result Date: 11/15/2022 CLINICAL DATA:  Pre MRI evaluation EXAM: ABDOMEN - 1 VIEW COMPARISON:  09/10/2022 FINDINGS: Bilateral hip replacements are noted. Degenerative changes of lumbar spine are seen. Postsurgical changes are noted in the left upper quadrant. No radiopaque foreign body is seen. No free air is noted. IMPRESSION: No evidence of radiopaque foreign body. Electronically Signed   By: Inez Catalina M.D.   On: 11/15/2022 23:16   CT Head Wo Contrast  Result Date: 11/15/2022 CLINICAL DATA:  Altered mental status, failure to thrive EXAM: CT HEAD WITHOUT CONTRAST TECHNIQUE: Contiguous axial images were obtained from the  base of the skull through the vertex without intravenous contrast. RADIATION DOSE REDUCTION: This exam was performed  according to the departmental dose-optimization program which includes automated exposure control, adjustment of the mA and/or kV according to patient size and/or use of iterative reconstruction technique. COMPARISON:  10/31/2022 FINDINGS: Brain: No evidence of acute infarction, hemorrhage, hydrocephalus, extra-axial collection or mass lesion/mass effect. Mild cortical atrophy. Vascular: Mild intracranial atherosclerosis. Skull: Normal. Negative for fracture or focal lesion. Sinuses/Orbits: The visualized paranasal sinuses are essentially clear. The mastoid air cells are unopacified. Other: None. IMPRESSION: No evidence of acute intracranial abnormality. Mild cortical atrophy. Electronically Signed   By: Julian Hy M.D.   On: 11/15/2022 21:15   DG Chest Port 1 View  Result Date: 11/15/2022 CLINICAL DATA:  Weakness, failure to thrive EXAM: PORTABLE CHEST 1 VIEW COMPARISON:  10/31/2022 FINDINGS: Suture line in the left upper lobe. Associated volume loss in left hemithorax. Right lung is clear. No pneumothorax. The heart is normal in size. Surgical clips in the right chest wall/axilla. Old left rib fracture deformities. IMPRESSION: No evidence of acute cardiopulmonary disease. Electronically Signed   By: Julian Hy M.D.   On: 11/15/2022 17:55    Pending Labs Unresulted Labs (From admission, onward)     Start     Ordered   11/15/22 1722  Lactic acid, plasma  Now then every 2 hours,   R (with STAT occurrences)      11/15/22 1721   11/15/22 1722  Culture, blood (routine x 2)  BLOOD CULTURE X 2,   R (with STAT occurrences)      11/15/22 1721            Vitals/Pain Today's Vitals   11/16/22 0530 11/16/22 0545 11/16/22 0600 11/16/22 0700  BP: 137/78 (!) 153/91 (!) 168/81 (!) 173/78  Pulse: 72 72 73 69  Resp: (!) 21 (!) 32 (!) 24 (!) 25  Temp:      TempSrc:      SpO2: 100% 100% 100% 100%  PainSc:        Isolation Precautions No active isolations  Medications Medications   rosuvastatin (CRESTOR) tablet 10 mg (10 mg Oral Given 11/15/22 2233)  aspirin EC tablet 81 mg (81 mg Oral Given 11/15/22 2234)  losartan (COZAAR) tablet 50 mg (has no administration in time range)  hydrALAZINE (APRESOLINE) injection 5-10 mg (10 mg Intravenous Given 11/16/22 0421)  enoxaparin (LOVENOX) injection 40 mg (has no administration in time range)  acetaminophen (TYLENOL) tablet 650 mg (has no administration in time range)    Or  acetaminophen (TYLENOL) suppository 650 mg (has no administration in time range)  ondansetron (ZOFRAN) tablet 4 mg (has no administration in time range)    Or  ondansetron (ZOFRAN) injection 4 mg (has no administration in time range)  potassium chloride 10 mEq in 100 mL IVPB (0 mEq Intravenous Stopped 11/15/22 2002)  potassium chloride 10 mEq in 100 mL IVPB (0 mEq Intravenous Stopped 11/16/22 0546)  potassium chloride SA (KLOR-CON M) CR tablet 40 mEq (40 mEq Oral Given 11/15/22 2233)  LORazepam (ATIVAN) injection 1 mg (1 mg Intravenous Given 11/15/22 2356)    Mobility walks with person assist     Focused Assessments Pt found to have low Potassium on arrival.  Pt has been treated for same.  Pt also ambulatory with assistance to bathroom.  Pt is somewhat confused about staying in bed so bed alarm is used with pt.    R Recommendations: See Admitting Provider Note  Report given to:   Additional Notes:

## 2022-11-16 NOTE — Assessment & Plan Note (Signed)
Hold metformin Sensitive SSI Q4H for the moment.

## 2022-11-16 NOTE — Progress Notes (Signed)
Neurology attempted consult note  Neurology was consulted on this 77 year old woman with a past medical history of CAD, hypertension, hyperlipidemia, diabetes, stroke, chronic pain on chronic opiates who presented to the ED for evaluation of lethargy and confusion earlier today.  She has had multiple recent admissions for polypharmacy and AMS.  MRI brain revealed a few scattered subcentimeter foci of diffusion signal abnormality in the right frontal centrum semiovale.  Neurology was consulted for stroke workup.  Our nurse practitioner did see the patient today however when I came over from [facility] patient was off the floor and had not returned by the time I had to return to [facility].  Will be formally staffed tomorrow.  On nurse practitioners examination she was disoriented but had no focal deficits.  Interim recommendations:  - HgbA1c, fasting lipid panel - Frequent neuro checks - Echocardiogram - CTA head and neck - Prophylactic therapy-Antiplatelet med: Aspirin - dose '81mg'$  and plavix '75mg'$  daily   - Risk factor modification - Telemetry monitoring - PT consult, OT consult, Speech consult  Su Monks, MD [facility] [phone]  If 7pm- 7am, please page neurology on call as listed in [place].

## 2022-11-16 NOTE — Assessment & Plan Note (Signed)
Cont crestor °

## 2022-11-16 NOTE — Consult Note (Signed)
Neurology Consultation  Reason for Consult: stroke on MRI  Referring Physician: Dr. Erlinda Hong   CC: Failure to thrive   History is obtained from:medical record   HPI: [name] is a 77 y.o. female with past medical history of CAD, HTN, HLD, DM, stroke, chronic pain on chronic opiates presented to the ED for evaluation of lethargy and confusion on 3/3.  Multiple recent admissions for polypharmacy/ AMS. MRI brain revealed Few scattered subcentimeter foci of diffusion signal abnormality involving the right frontal centrum semi ovale. Neurology consulted    LKW: unclear  IV thrombolysis given?: no EVT:  No, low NIHSS  Premorbid modified Rankin scale (mRS):  2-Slight disability-UNABLE to perform all activities but does not need assistance    ROS:  Unable to obtain due to altered mental status.   Past Medical History:  Diagnosis Date   Asthma    Breast cancer ([place])    right breast   COPD (chronic obstructive pulmonary disease) (HCC)    Coronary artery disease    Diverticulum of esophagus    Elevated LFTs    Emphysema lung (HCC)    ETOH abuse    H/O atrial fibrillation without current medication    only one time when she had sepsis   Hyperlipidemia    Hypertension    hx of but not on any medications   Myocardial infarction ([place]) 2000   OA (osteoarthritis) of knee    Osteoarthritis    Tobacco abuse      Family History  Problem Relation Age of Onset   Diabetes Mother    Hypertension Father    Heart disease Father    Heart attack Father    Stroke Father      Social History:   reports that she has been smoking cigarettes. She has been smoking an average of 1 pack per day. She has never used smokeless tobacco. She reports that she does not drink alcohol and does not use drugs.  Medications  Current Facility-Administered Medications:    acetaminophen (TYLENOL) tablet 650 mg, 650 mg, Oral, Q6H PRN **OR** acetaminophen (TYLENOL) suppository 650 mg, 650 mg, Rectal, Q6H PRN,  Alcario Drought, Jared M, DO   aspirin EC tablet 81 mg, 81 mg, Oral, Daily, Alcario Drought, Jared M, DO, 81 mg at 11/16/22 0934   enoxaparin (LOVENOX) injection 40 mg, 40 mg, Subcutaneous, Q24H, Alcario Drought, Jared M, DO, 40 mg at 11/16/22 [id number]   hydrALAZINE (APRESOLINE) injection 5-10 mg, 5-10 mg, Intravenous, Q4H PRN, Etta Quill, DO, 10 mg at 11/16/22 0421   losartan (COZAAR) tablet 50 mg, 50 mg, Oral, Daily, Alcario Drought, Jared M, DO, 50 mg at 11/16/22 0934   ondansetron (ZOFRAN) tablet 4 mg, 4 mg, Oral, Q6H PRN **OR** ondansetron (ZOFRAN) injection 4 mg, 4 mg, Intravenous, Q6H PRN, Alcario Drought, Jared M, DO   rosuvastatin (CRESTOR) tablet 10 mg, 10 mg, Oral, Daily, Alcario Drought, Jared M, DO, 10 mg at 11/16/22 [id number]   Exam: Current vital signs: BP (!) 169/78 (BP Location: Left Arm)   Pulse 65   Temp 98.4 F (36.9 C) (Oral)   Resp 20   Wt 50.9 kg   SpO2 98%   BMI 20.52 kg/m  Vital signs in last 24 hours: Temp:  [97.6 F (36.4 C)-98.4 F (36.9 C)] 98.4 F (36.9 C) (03/03 1128) Pulse Rate:  [59-80] 65 (03/03 1128) Resp:  [17-32] 20 (03/03 1128) BP: (137-191)/(61-95) 169/78 (03/03 1128) SpO2:  [94 %-100 %] 98 % (03/03 1128) Weight:  [50.9 kg] 50.9 kg (03/03  1200)  GENERAL: frail elderly female Awake, alert in NAD HEENT: - Normocephalic and atraumatic, dry mm, edentulous LUNGS - Clear to auscultation bilaterally with no wheezes CV - S1S2 RRR, no m/r/g, equal pulses bilaterally. ABDOMEN - Soft, nontender, nondistended with normoactive BS Ext: warm, well perfused, intact peripheral pulses, no edema  NEURO:  Mental Status: Poor attention and concentration. AA&O to self, and place "[employer] and "[place]." Unable to state correct date of birth, year or age.  Language: speech is clear. Can name, repeat, and follow commands  Cranial Nerves: PERRL EOMI, visual fields full, no facial asymmetry, facial sensation intact, hearing intact, tongue/uvula/soft palate midline, normal sternocleidomastoid and trapezius  muscle strength. No evidence of tongue atrophy or fibrillations Motor: 4/5 in all 4 extremities  Sensation- Intact to light touch bilaterally Coordination: FTN grossly intact bilaterally Gait- deferred  NIHSS 1a Level of Conscious.: 0 1b LOC Questions: 2 1c LOC Commands: 0 2 Best Gaze: 0 3 Visual: 0 4 Facial Palsy: 0 5a Motor Arm - left: 0 5b Motor Arm - Right: 0 6a Motor Leg - Left: 0 6b Motor Leg - Right: 0 7 Limb Ataxia: 0 8 Sensory: 0 9 Best Language: 0 10 Dysarthria: 0 11 Extinct. and Inatten.: 0 TOTAL: 2   Labs I have reviewed labs in epic and the results pertinent to this consultation are:  CBC    Component Value Date/Time   WBC 8.2 11/16/2022 0330   RBC 4.29 11/16/2022 0330   HGB 13.3 11/16/2022 0330   HCT 39.3 11/16/2022 0330   PLT 263 11/16/2022 0330   MCV 91.6 11/16/2022 0330   MCH 31.0 11/16/2022 0330   MCHC 33.8 11/16/2022 0330   RDW 14.3 11/16/2022 0330   LYMPHSABS 1.5 11/15/2022 1752   MONOABS 0.6 11/15/2022 1752   EOSABS 0.0 11/15/2022 1752   BASOSABS 0.0 11/15/2022 1752    CMP     Component Value Date/Time   NA 136 11/16/2022 0330   NA 138 12/20/2019 0839   K 3.1 (L) 11/16/2022 0330   CL 101 11/16/2022 0330   CO2 23 11/16/2022 0330   GLUCOSE 92 11/16/2022 0330   BUN 10 11/16/2022 0330   BUN 16 12/20/2019 0839   CREATININE 0.57 11/16/2022 0330   CREATININE 0.54 (L) 06/01/2018 1502   CALCIUM 8.6 (L) 11/16/2022 0330   PROT 7.0 11/15/2022 1752   PROT 7.2 12/20/2019 0839   ALBUMIN 3.3 (L) 11/15/2022 1752   ALBUMIN 4.4 12/20/2019 0839   AST 20 11/15/2022 1752   ALT 13 11/15/2022 1752   ALKPHOS 64 11/15/2022 1752   BILITOT 1.0 11/15/2022 1752   BILITOT 0.2 12/20/2019 0839   GFRNONAA >60 11/16/2022 0330   GFRNONAA 96 02/16/2018 1042   GFRAA 103 12/20/2019 0839   GFRAA 111 02/16/2018 1042    Lipid Panel     Component Value Date/Time   CHOL 112 09/07/2022 0249   CHOL 135 12/20/2019 0839   TRIG 68 09/07/2022 0249   HDL 38 (L)  09/07/2022 0249   HDL 51 12/20/2019 0839   CHOLHDL 2.9 09/07/2022 0249   VLDL 14 09/07/2022 0249   LDLCALC 60 09/07/2022 0249   LDLCALC 70 12/20/2019 0839      Imaging I have reviewed the images obtained:  CT-head no acute process   MRI examination of the brain  Few scattered subcentimeter foci of diffusion signal abnormality involving the right frontal centrum semi ovale, likely reflecting small evolving subacute small vessel type infarcts.   Assessment:  77 y.o. female with past medical history of CAD, HTN, HLD, DM, stroke, chronic pain on chronic opiates presented to the ED for evaluation of lethargy and confusion on 3/3.  Incidental Subacute right frontal ischemic infarct   Recommendations: - HgbA1c, fasting lipid panel - Frequent neuro checks - Echocardiogram - CTA head and neck - Prophylactic therapy-Antiplatelet med: Aspirin - dose '81mg'$  and plavix '75mg'$  daily   - Risk factor modification - Telemetry monitoring - PT consult, OT consult, Speech consult - Stroke team to follow  Beulah Gandy DNP, ACNPC-AG  [facility]

## 2022-11-16 NOTE — Assessment & Plan Note (Signed)
Cont home Losartan Adding PRN hydralazine

## 2022-11-16 NOTE — Progress Notes (Addendum)
No charge note: Patient is admitted this morning for failure to thrive, altered mental status, decreased po intake, currently she is weak, knows she is at [facility] long, not oriented to time, mri brain showed subacute infarct, neurology consulted,  She appear dehydrated, start hydration, full liquid diet for now , she states she does not want to eat or drink , has no appetite, she denies difficulty swallowing, denies dysphagia, no n/v, speech eval.

## 2022-11-17 DIAGNOSIS — B370 Candidal stomatitis: Secondary | ICD-10-CM | POA: Diagnosis present

## 2022-11-17 DIAGNOSIS — E876 Hypokalemia: Secondary | ICD-10-CM | POA: Diagnosis present

## 2022-11-17 DIAGNOSIS — Z6820 Body mass index (BMI) 20.0-20.9, adult: Secondary | ICD-10-CM | POA: Diagnosis not present

## 2022-11-17 DIAGNOSIS — J4489 Other specified chronic obstructive pulmonary disease: Secondary | ICD-10-CM | POA: Diagnosis present

## 2022-11-17 DIAGNOSIS — Z8673 Personal history of transient ischemic attack (TIA), and cerebral infarction without residual deficits: Secondary | ICD-10-CM | POA: Diagnosis not present

## 2022-11-17 DIAGNOSIS — G8929 Other chronic pain: Secondary | ICD-10-CM | POA: Diagnosis present

## 2022-11-17 DIAGNOSIS — R8271 Bacteriuria: Secondary | ICD-10-CM | POA: Diagnosis present

## 2022-11-17 DIAGNOSIS — I252 Old myocardial infarction: Secondary | ICD-10-CM | POA: Diagnosis not present

## 2022-11-17 DIAGNOSIS — I6523 Occlusion and stenosis of bilateral carotid arteries: Secondary | ICD-10-CM | POA: Diagnosis present

## 2022-11-17 DIAGNOSIS — F1120 Opioid dependence, uncomplicated: Secondary | ICD-10-CM | POA: Diagnosis present

## 2022-11-17 DIAGNOSIS — Z888 Allergy status to other drugs, medicaments and biological substances status: Secondary | ICD-10-CM | POA: Diagnosis not present

## 2022-11-17 DIAGNOSIS — G9341 Metabolic encephalopathy: Secondary | ICD-10-CM | POA: Diagnosis not present

## 2022-11-17 DIAGNOSIS — I10 Essential (primary) hypertension: Secondary | ICD-10-CM | POA: Diagnosis not present

## 2022-11-17 DIAGNOSIS — G928 Other toxic encephalopathy: Secondary | ICD-10-CM | POA: Diagnosis present

## 2022-11-17 DIAGNOSIS — Z1152 Encounter for screening for COVID-19: Secondary | ICD-10-CM | POA: Diagnosis not present

## 2022-11-17 DIAGNOSIS — A0811 Acute gastroenteropathy due to Norwalk agent: Secondary | ICD-10-CM | POA: Diagnosis present

## 2022-11-17 DIAGNOSIS — E785 Hyperlipidemia, unspecified: Secondary | ICD-10-CM | POA: Diagnosis present

## 2022-11-17 DIAGNOSIS — Y92009 Unspecified place in unspecified non-institutional (private) residence as the place of occurrence of the external cause: Secondary | ICD-10-CM | POA: Diagnosis not present

## 2022-11-17 DIAGNOSIS — Z515 Encounter for palliative care: Secondary | ICD-10-CM | POA: Diagnosis not present

## 2022-11-17 DIAGNOSIS — Z853 Personal history of malignant neoplasm of breast: Secondary | ICD-10-CM | POA: Diagnosis not present

## 2022-11-17 DIAGNOSIS — Z881 Allergy status to other antibiotic agents status: Secondary | ICD-10-CM | POA: Diagnosis not present

## 2022-11-17 DIAGNOSIS — E119 Type 2 diabetes mellitus without complications: Secondary | ICD-10-CM | POA: Diagnosis present

## 2022-11-17 DIAGNOSIS — G9340 Encephalopathy, unspecified: Secondary | ICD-10-CM | POA: Diagnosis not present

## 2022-11-17 DIAGNOSIS — R627 Adult failure to thrive: Secondary | ICD-10-CM | POA: Diagnosis present

## 2022-11-17 DIAGNOSIS — I2510 Atherosclerotic heart disease of native coronary artery without angina pectoris: Secondary | ICD-10-CM | POA: Diagnosis present

## 2022-11-17 DIAGNOSIS — T40605A Adverse effect of unspecified narcotics, initial encounter: Secondary | ICD-10-CM | POA: Diagnosis present

## 2022-11-17 DIAGNOSIS — F17210 Nicotine dependence, cigarettes, uncomplicated: Secondary | ICD-10-CM | POA: Diagnosis present

## 2022-11-17 LAB — GASTROINTESTINAL PANEL BY PCR, STOOL (REPLACES STOOL CULTURE)

## 2022-11-17 LAB — LIPID PANEL
Cholesterol: 151 mg/dL (ref 0–200)
HDL: 30 mg/dL — ABNORMAL LOW (ref >40)
LDL Cholesterol: 101 mg/dL — ABNORMAL HIGH (ref 0–99)
Total CHOL/HDL Ratio: 5 RATIO
Triglycerides: 102 mg/dL (ref <150)
VLDL: 20 mg/dL (ref 0–40)

## 2022-11-17 LAB — C DIFFICILE QUICK SCREEN W PCR REFLEX
C Diff antigen: NEGATIVE
C Diff interpretation: NOT DETECTED
C Diff toxin: NEGATIVE

## 2022-11-17 LAB — BASIC METABOLIC PANEL
Anion gap: 11 (ref 5–15)
Anion gap: 8 (ref 5–15)
BUN: 9 mg/dL (ref 8–23)
BUN: 10 mg/dL (ref 8–23)
CO2: 22 mmol/L (ref 22–32)
CO2: 21 mmol/L — ABNORMAL LOW (ref 22–32)
Calcium: 8.6 mg/dL — ABNORMAL LOW (ref 8.9–10.3)
Calcium: 8.5 mg/dL — ABNORMAL LOW (ref 8.9–10.3)
Chloride: 101 mmol/L (ref 98–111)
Chloride: 103 mmol/L (ref 98–111)
Creatinine, Ser: 0.47 mg/dL (ref 0.44–1.00)
Creatinine, Ser: 0.56 mg/dL (ref 0.44–1.00)
GFR, Estimated: >60 mL/min (ref >60)
GFR, Estimated: >60 mL/min (ref >60)
Glucose, Bld: 87 mg/dL (ref 70–99)
Glucose, Bld: 131 mg/dL — ABNORMAL HIGH (ref 70–99)
Potassium: 2.4 mmol/L — CL (ref 3.5–5.1)
Potassium: 3.4 mmol/L — ABNORMAL LOW (ref 3.5–5.1)
Sodium: 134 mmol/L — ABNORMAL LOW (ref 135–145)
Sodium: 132 mmol/L — ABNORMAL LOW (ref 135–145)

## 2022-11-17 LAB — MAGNESIUM: Magnesium: 1.7 mg/dL (ref 1.7–2.4)

## 2022-11-17 LAB — PHOSPHORUS: Phosphorus: 3.7 mg/dL (ref 2.5–4.6)

## 2022-11-17 MED ORDER — POTASSIUM CHLORIDE IN NACL 20-0.9 MEQ/L-% IV SOLN
INTRAVENOUS | Status: DC
  Administered 2022-11-18: 50 mL/h via INTRAVENOUS
  Filled 2022-11-17 (×4): qty 1000

## 2022-11-17 MED ORDER — POTASSIUM CHLORIDE CRYS ER 20 MEQ PO TBCR
20.0000 meq | EXTENDED_RELEASE_TABLET | Freq: Once | ORAL | Status: AC
  Administered 2022-11-17: 20 meq via ORAL
  Filled 2022-11-17: qty 1

## 2022-11-17 MED ORDER — POTASSIUM CHLORIDE 10 MEQ/100ML IV SOLN
10.0000 meq | INTRAVENOUS | Status: AC
  Administered 2022-11-17 (×6): 10 meq via INTRAVENOUS
  Filled 2022-11-17 (×6): qty 100

## 2022-11-17 MED ORDER — POTASSIUM CHLORIDE 20 MEQ PO PACK: 60.0000 meq | PACK | Freq: Once | ORAL | Status: DC

## 2022-11-17 MED ORDER — POTASSIUM CHLORIDE CRYS ER 20 MEQ PO TBCR
40.0000 meq | EXTENDED_RELEASE_TABLET | Freq: Once | ORAL | Status: AC
  Administered 2022-11-17: 40 meq via ORAL
  Filled 2022-11-17: qty 2

## 2022-11-17 MED ORDER — MAGNESIUM SULFATE IN D5W 1-5 GM/100ML-% IV SOLN
1.0000 g | Freq: Once | INTRAVENOUS | Status: AC
  Administered 2022-11-17: 1 g via INTRAVENOUS
  Filled 2022-11-17: qty 100

## 2022-11-17 NOTE — Hospital Course (Addendum)
77 y.o. female with medical history significant of CAD, HTN, HLD, stroke, chronic pain on chronic opiate who was admitted in Nov for AMS + AKI, felt to be due to polypharmacy including opiates and in Dec for multifocal acute strokes, felt by neurology to be due to CVD and hypotension in setting of polypharmacy including opiates and again recent admission 2/16 - 2/18 for AMS / lethargy and confusion which was, once again, felt to be due to ongoing polypharmacy.  She was ultimately restarted on a reduced dose of pain meds for her chronic pain at time of discharge.  Since discharge, her husband reports that she went back to using exact same dosing that was felt to be the cause of her AMS prior to her admission 2 weeks ago.  And he reports that she has not reduced her pain medication usage, as instructed, at all. For the past 4 days she has been lethargic at home.  Ultimately sent in to ER for lethargy and confusion. Patient reports that she feels weak. She is otherwise without complaint. She denies chest pain, shortness of breath, nausea, vomiting, abdominal pain, fevers.  She states she is "not sure" how much pain medication she is taking at home (was clearly confused at this time).  Patient admitted for acute encephalopathy with lethargy altered mental status with polypharmacy, chronic pain diabetes essential hypertension hyperlipidemia

## 2022-11-17 NOTE — Evaluation (Signed)
Physical Therapy Evaluation Patient Details Name: [name] MRN: [id number] DOB: 07-23-1946 Today's Date: 11/17/2022  History of Present Illness  77 yo female admitted with acute encephalopathy/AMS, FTT, weakness. Ultimately sent in to ER from home for lethargy and confusion. Medical hx of CAD, COPD, breast CA s/p mastectomy, MI, Bil THR, PE, DM, Chronic pain syndrome with back pain, and ETOH abuse.  Clinical Impression  On eval, pt required Min guard-Min A for transfer to/from bsc. Pt presents with general weakness, decreased activity tolerance, and impaired gait and balance. She fatigues quickly/easily with minimal activity. No family was present during session. If pt will have 24/7 supervision/assist at home, she could return home with HHPT f/u. Will continue to follow and progress activity as tolerated.        Recommendations for follow up therapy are one component of a multi-disciplinary discharge planning process, led by the attending physician.  Recommendations may be updated based on patient status, additional functional criteria and insurance authorization.  Follow Up Recommendations Home health PT      Assistance Recommended at Discharge Frequent or constant Supervision/Assistance  Patient can return home with the following  A little help with walking and/or transfers;A little help with bathing/dressing/bathroom;Assistance with cooking/housework;Assist for transportation;Help with stairs or ramp for entrance    Equipment Recommendations None recommended by PT  Recommendations for Other Services       Functional Status Assessment Patient has had a recent decline in their functional status and demonstrates the ability to make significant improvements in function in a reasonable and predictable amount of time.     Precautions / Restrictions Precautions Precautions: Fall Precaution Comments: kyphotic Restrictions Weight Bearing Restrictions: No      Mobility  Bed  Mobility Overal bed mobility: Needs Assistance Bed Mobility: Supine to Sit, Sit to Supine     Supine to sit: Min guard, HOB elevated Sit to supine: HOB elevated, Min guard   General bed mobility comments: Increased time. Cues for safety. Motivated by need to use bathroom    Transfers Overall transfer level: Needs assistance   Transfers: Bed to chair/wheelchair/BSC   Stand pivot transfers: Min assist         General transfer comment: Pt relied on arms of bsc and or bedrail for UE support. Increased time. Cues for safety. Fatigues very easily/quickly.    Ambulation/Gait               General Gait Details: NT-fatigued  Stairs            Wheelchair Mobility    Modified Rankin (Stroke Patients Only)       Balance Overall balance assessment: Needs assistance Sitting-balance support: Bilateral upper extremity supported Sitting balance-Leahy Scale: Fair       Standing balance-Leahy Scale: Poor Standing balance comment: Poor standing tolerance. Very quick to fatigue.                             Pertinent Vitals/Pain Pain Assessment Pain Assessment: Faces Faces Pain Scale: Hurts a little bit Pain Location: "not comfortable" Pain Descriptors / Indicators: Discomfort Pain Intervention(s): Monitored during session    Home Living Family/patient expects to be discharged to:: Private residence Living Arrangements: Spouse/significant other Available Help at Discharge: Family;Available 24 hours/day Type of Home: House Home Access: Level entry       Home Layout: Multi-level;Able to live on main level with bedroom/bathroom Home Equipment: Rollator (4 wheels)  Prior Function Prior Level of Function : Needs assist             Mobility Comments: using rollator PRN, spouse/pt share IADls. Drives. Pt reports that she has resumed riving since this past recent hospitalization. Son assists as well as  needed. helps with grocery shopping. Son  works       Journalist, newspaper        Extremity/Trunk Assessment   Upper Extremity Assessment Upper Extremity Assessment: Defer to OT evaluation    Lower Extremity Assessment Lower Extremity Assessment: Generalized weakness    Cervical / Trunk Assessment Cervical / Trunk Assessment: Kyphotic Cervical / Trunk Exceptions: h/o chronic back pain but pt denied back surgeries in the past.  Communication   Communication: No difficulties  Cognition Arousal/Alertness: Awake/alert Behavior During Therapy: Flat affect, Impulsive Overall Cognitive Status: No family/caregiver present to determine baseline cognitive functioning Area of Impairment: Orientation                 Orientation Level: Disoriented to, Situation, Time   Memory: Decreased short-term memory                  General Comments      Exercises     Assessment/Plan    PT Assessment Patient needs continued PT services  PT Problem List Decreased strength;Decreased activity tolerance;Decreased balance;Decreased mobility;Decreased knowledge of use of DME;Decreased cognition       PT Treatment Interventions DME instruction;Gait training;Stair training;Functional mobility training;Therapeutic activities;Therapeutic exercise;Patient/family education;Cognitive remediation    PT Goals (Current goals can be found in the Care Plan section)  Acute Rehab PT Goals Patient Stated Goal: none stated PT Goal Formulation: With patient Time For Goal Achievement: 12/01/22 Potential to Achieve Goals: Fair    Frequency Min 3X/week     Co-evaluation               AM-PAC PT "6 Clicks" Mobility  Outcome Measure Help needed turning from your back to your side while in a flat bed without using bedrails?: None Help needed moving from lying on your back to sitting on the side of a flat bed without using bedrails?: A Little Help needed moving to and from a bed to a chair (including a wheelchair)?: A Little Help  needed standing up from a chair using your arms (e.g., wheelchair or bedside chair)?: A Little Help needed to walk in hospital room?: A Lot Help needed climbing 3-5 steps with a railing? : A Lot 6 Click Score: 17    End of Session   Activity Tolerance: Patient limited by fatigue Patient left: in bed;with call bell/phone within reach;with bed alarm set   PT Visit Diagnosis: Muscle weakness (generalized) (M62.81);Difficulty in walking, not elsewhere classified (R26.2)    Time: [id number] PT Time Calculation (min) (ACUTE ONLY): 14 min   Charges:   PT Evaluation $PT Eval Low Complexity: [place], PT Acute Rehabilitation  Office: [phone]

## 2022-11-17 NOTE — Evaluation (Signed)
Occupational Therapy Evaluation Patient Details Name: [name] MRN: [id number] DOB: 05-30-1946 Today's Date: 11/17/2022   History of Present Illness Patient is a 77 year old female who was admitted from home 2* acute metabolic encehpalopathy and returned home on 11/02/22. Since discharge home, her husband reports that she went back to using exact same dosing that was felt to be the cause of her AMS prior to her admission 2 weeks ago.  And he reports that she has not reduced her pain medication usage, as instructed, at all.  For the past 4 days prior to current admission, she has been lethargic at home.  Ultimately sent in to ER for lethargy and confusion. Medical hx of CAD, COPD, breast CA s/p mastectomy, MI, Bil THR, PE, DM, Chronic pain syndrome with back pain, and ETHO abuse.   Clinical Impression   Patient is currently requiring assistance with ADLs including moderate assist with Lower body ADLs, minimal assist with Upper body ADLs,  as well as  minimal assist with bed mobility and minimal assist with functional transfers to toilet.  Current level of function is below patient's typical baseline, however pt showed same challenges with her recent hospitalization last month.   During this evaluation, patient was limited by generalized weakness, impaired activity tolerance, chronic back pain which pt stated was not affecting her during OT visit, and cognitive deficits with perseveration, disorientation, impaired memory, all of which has the potential to impact patient's safety and independence during functional mobility, as well as performance for ADLs.  Patient lives with her husband and son, and pt's spouse is able to provide 24/7 supervision and assistance while son works.  Patient demonstrates fair rehab potential, and should benefit from continued skilled occupational therapy services while in acute care to maximize safety, independence and quality of life at home.  Continued occupational therapy  services in the home is recommended.  ?       Recommendations for follow up therapy are one component of a multi-disciplinary discharge planning process, led by the attending physician.  Recommendations may be updated based on patient status, additional functional criteria and insurance authorization.   Follow Up Recommendations  Home health OT     Assistance Recommended at Discharge Frequent or constant Supervision/Assistance  Patient can return home with the following A little help with walking and/or transfers;A little help with bathing/dressing/bathroom;Assistance with cooking/housework;Direct supervision/assist for medications management;Assist for transportation;Help with stairs or ramp for entrance;Direct supervision/assist for financial management    Functional Status Assessment  Patient has had a recent decline in their functional status and demonstrates the ability to make significant improvements in function in a reasonable and predictable amount of time.  Equipment Recommendations  None recommended by OT    Recommendations for Other Services       Precautions / Restrictions Precautions Precautions: Fall Precaution Comments: kyphotic Restrictions Weight Bearing Restrictions: No      Mobility Bed Mobility Overal bed mobility: Needs Assistance Bed Mobility: Supine to Sit, Sit to Supine     Supine to sit: Min assist Sit to supine: Supervision   General bed mobility comments: min assist to bring trunk to upright with pt initiating pulling self up by grabbing OT's arm.  Bed flat.    Transfers Overall transfer level: Needs assistance Equipment used: Rolling walker (2 wheels) Transfers: Sit to/from Stand Sit to Stand: Min guard, Min assist                  Balance Overall balance  assessment: Needs assistance Sitting-balance support: No upper extremity supported, Feet supported Sitting balance-Leahy Scale: Fair     Standing balance support: Reliant on  assistive device for balance, During functional activity, Bilateral upper extremity supported Standing balance-Leahy Scale: Poor Standing balance comment: Poor standing tolerance. Very quick to fatigue.                           ADL either performed or assessed with clinical judgement   ADL Overall ADL's : Needs assistance/impaired   Eating/Feeding Details (indicate cue type and reason): Refusing despite encouragement.  Appears capable of self-feeding. Grooming: Sitting;Set up;Supervision/safety Grooming Details (indicate cue type and reason): Refused all grooming stating too fatigued.  Likely able to perform grooming at EOB with setup/supervision. Upper Body Bathing: Minimal assistance;Sitting   Lower Body Bathing: Moderate assistance;Sit to/from stand   Upper Body Dressing : Minimal assistance;Sitting   Lower Body Dressing: Moderate assistance;Sitting/lateral leans Lower Body Dressing Details (indicate cue type and reason): patient was able to don/doff LT sock only due to fatigue at EOB with therapist blocking patients trunk for safety as pt leaned all the way down to the floor for task. Pt unable to achieve Figure 4 postioning at EOB. Increased effort with pt SHOB after returning to upright sitting. Toilet Transfer: Minimal assistance;Rolling walker (2 wheels) Toilet Transfer Details (indicate cue type and reason): Minimal assist to stand from EOB to RW and take about 2 LT lateral steps to [facility] with Min As. Toileting- Clothing Manipulation and Hygiene: Maximal assistance;Sit to/from stand       Functional mobility during ADLs: Minimal assistance;Rolling walker (2 wheels)       Vision   Vision Assessment?: No apparent visual deficits     Perception     Praxis      Pertinent Vitals/Pain Pain Assessment Pain Assessment: No/denies pain (Pt endorses chronic back pain but denied pain throughout OT visit.)     Hand Dominance Right   Extremity/Trunk Assessment Upper  Extremity Assessment Upper Extremity Assessment: Generalized weakness   Lower Extremity Assessment Lower Extremity Assessment: Generalized weakness   Cervical / Trunk Assessment Cervical / Trunk Assessment: Kyphotic;Other exceptions Cervical / Trunk Exceptions: h/o chronic back pain but pt denied back surgeries in the past.   Communication Communication Communication: No difficulties   Cognition Arousal/Alertness: Awake/alert Behavior During Therapy: Flat affect, Impulsive Overall Cognitive Status: No family/caregiver present to determine baseline cognitive functioning Area of Impairment: Orientation, Memory                 Orientation Level: Disoriented to, Time, Situation, Place   Memory: Decreased short-term memory (Pt denied some parts of her PMH, for example h/o hip replacement.)               General Comments       Exercises     Shoulder Instructions      Home Living                                          Prior Functioning/Environment Prior Level of Function : Independent/Modified Independent             Mobility Comments: using rollator PRN, spouse/pt share IADls. Drives. Pt reports that she has resumed riving since this past recent hospitalization. Son assists as well as  needed. helps with grocery shopping. Son works ADLs Comments:  independent, no falls reported        OT Problem List: Decreased activity tolerance;Impaired balance (sitting and/or standing);Decreased cognition;Decreased safety awareness;Pain;Cardiopulmonary status limiting activity      OT Treatment/Interventions: Self-care/ADL training;Therapeutic activities;Patient/family education;Balance training;Neuromuscular education;Therapeutic exercise (Non-pharm pain management techniques.)    OT Goals(Current goals can be found in the care plan section) Acute Rehab OT Goals OT Goal Formulation: Patient unable to participate in goal setting Time For Goal  Achievement: 12/01/22 Potential to Achieve Goals: Fair ADL Goals Pt Will Perform Lower Body Dressing: with modified independence;with adaptive equipment;sitting/lateral leans;sit to/from stand Pt Will Transfer to Toilet: with modified independence;ambulating Pt Will Perform Toileting - Clothing Manipulation and hygiene: with modified independence;with adaptive equipment;sitting/lateral leans;sit to/from stand Pt Will Perform Tub/Shower Transfer: Tub transfer;with min guard assist Pt/caregiver will Perform Home Exercise Program: Increased strength;Both right and left upper extremity;With Supervision Additional ADL Goal #1: Pt will identify at least 2 pain management techniques that due not involve Rx medication, in order to support quiality of life at home and prevent future admissions to hospital.  OT Frequency: Min 2X/week    Co-evaluation              AM-PAC OT "6 Clicks" Daily Activity     Outcome Measure Help from another person eating meals?: None Help from another person taking care of personal grooming?: A Little Help from another person toileting, which includes using toliet, bedpan, or urinal?: A Lot Help from another person bathing (including washing, rinsing, drying)?: A Lot Help from another person to put on and taking off regular upper body clothing?: A Little Help from another person to put on and taking off regular lower body clothing?: A Lot 6 Click Score: 16   End of Session Equipment Utilized During Treatment: Rolling walker (2 wheels) Nurse Communication: Mobility status  Activity Tolerance: Patient limited by fatigue Patient left: in bed;with call bell/phone within reach;with bed alarm set;with nursing/sitter in room  OT Visit Diagnosis: Unsteadiness on feet (R26.81);Other abnormalities of gait and mobility (R26.89);Muscle weakness (generalized) (M62.81);Pain                Time: [id number] OT Time Calculation (min): 21 min Charges:  OT General Charges $OT  Visit: 1 Visit OT Evaluation $OT Eval Low Complexity: [place], [place] Office: [phone] 11/17/2022  Julien Girt 11/17/2022, 12:52 PM

## 2022-11-17 NOTE — Progress Notes (Signed)
PROGRESS NOTE [name]  [id number] DOB: [birth year] DOA: 11/15/2022 PCP: [name], FNP  Brief Narrative/Hospital Course: 77 y.o. female with medical history significant of CAD, HTN, HLD, stroke, chronic pain on chronic opiate who was admitted in Nov for AMS + AKI, felt to be due to polypharmacy including opiates and in Dec for multifocal acute strokes, felt by neurology to be due to CVD and hypotension in setting of polypharmacy including opiates and again recent admission 2/16 - 2/18 for AMS / lethargy and confusion which was, once again, felt to be due to ongoing polypharmacy.  She was ultimately restarted on a reduced dose of pain meds for her chronic pain at time of discharge.  Since discharge, her husband reports that she went back to using exact same dosing that was felt to be the cause of her AMS prior to her admission 2 weeks ago.  And he reports that she has not reduced her pain medication usage, as instructed, at all. For the past 4 days she has been lethargic at home.  Ultimately sent in to ER for lethargy and confusion. Patient reports that she feels weak. She is otherwise without complaint. She denies chest pain, shortness of breath, nausea, vomiting, abdominal pain, fevers.  She states she is "not sure" how much pain medication she is taking at home (was clearly confused at this time).  Patient admitted for acute encephalopathy with lethargy altered mental status with polypharmacy, chronic pain diabetes essential hypertension hyperlipidemia     Subjective: Seen and examined this morning she is more alert awake oriented to self current place, unable to continue  tell her date of birth Moving upper and lower extremities awake follows commands.   Having diarrhea    Assessment and Plan: Principal Problem:   Acute encephalopathy Active Problems:   Chronic pain   Hyperlipidemia   Essential hypertension   DM2 (diabetes mellitus, type 2) (HCC)   Lethargy/altered mental  status loss acute metabolic/toxic encephalopathy: multifactorial suspect due opiate use.  Also likely has underlying UTI, previous similar admission.Workup with normal TSH, UA WBC more than 50 LE large-add urine culture on 3/4, UDS positive for opiates.  CT angio head stable no acute finding.  She will need medical instruction on which appears not to take.  Concern for stroke with few scattered subcentimeter foci of diffusion signal abnormality in the MRI: Neuro has been consulted workup A1c . LDL 101, echo CTA head and neck AS PER NEUROLOGY- she did have echo on 09/06/22. Cont on telemetry PT OT speech consulted.  ?  UTI urine culture ordered-follow-up-holding off on antibiotic pending urine culture, given she also has diarrhea, and mentation to continue to remain Diarrhea check for C. difficile and GI panel.  Chronic opioid use/chronic pain: She has had previous similar admission with altered mental status in the setting of excessive opioid use.    Diabetes mellitus: Blood sugar is controlled, continue SSI hold metformin Recent Labs  Lab 11/15/22 1813  GLUCAP 108*   Hypokalemia being replaced aggressively labs k 2.4.  Likely from diarrhea.  Recheck lab this afternoon Hypomagnesemia repleted Recent Labs  Lab 11/15/22 1752 11/16/22 0330 11/17/22 0338  K 2.5* 3.1* 2.4*  CALCIUM 8.6* 8.6* 8.6*  MG  --  1.8 1.7  PHOS  --   --  3.7     Hypertension: Fairly controlled on losartan Hyperlipidemia: Continue Crestor  DVT prophylaxis: enoxaparin (LOVENOX) injection 40 mg Start: 11/16/22 1000 Code Status:   Code Status: Full Code  Family Communication: plan of care discussed with patient at bedside. Patient status is:  inpatient because of diarrhea, hypokalemia, altered mental status Level of care: Telemetry   Dispo: The patient is from: home            Anticipated disposition: TBD-PT OT consulted  Objective: Vitals last 24 hrs: Vitals:   11/16/22 1548 11/16/22 2112 11/16/22 2322  11/17/22 0423  BP: (!) 175/85 (!) 168/84 (!) 170/83 (!) 154/68  Pulse: 65 66 62 62  Resp: 19   20  Temp: 98.1 F (36.7 C) 98.8 F (37.1 C) 98.4 F (36.9 C) 98.1 F (36.7 C)  TempSrc: Oral Oral Oral Oral  SpO2: 100% 96% 98% 99%  Weight:      Height:    '5\' 2"'$  (1.575 m)   Weight change:   Physical Examination: General exam: alert awake, Looking, older than stated age HEENT:Oral mucosa moist, Ear/Nose WNL grossly Respiratory system: bilaterally clear BS, no use of accessory muscle Cardiovascular system: S1 & S2 +, No JVD. Gastrointestinal system: Abdomen soft,NT,ND, BS+ Nervous System:Alert, awake, moving extremities. Extremities: LE edema neg,distal peripheral pulses palpable.  Skin: No rashes,no icterus. MSK: Normal muscle bulk,tone, power  Medications reviewed:  Scheduled Meds:   stroke: early stages of recovery book   Does not apply Once   aspirin EC  81 mg Oral Daily   enoxaparin (LOVENOX) injection  40 mg Subcutaneous Q24H   losartan  50 mg Oral Daily   rosuvastatin  10 mg Oral Daily   Continuous Infusions:  0.9 % NaCl with KCl 20 mEq / L 75 mL/hr at 11/17/22 0750   magnesium sulfate bolus IVPB 1 g (11/17/22 1036)   potassium chloride 10 mEq (11/17/22 0955)      Diet Order             Diet full liquid Fluid consistency: Thin  Diet effective now                            Intake/Output Summary (Last 24 hours) at 11/17/2022 1045 Last data filed at 11/17/2022 0154 Gross per 24 hour  Intake 200.78 ml  Output 900 ml  Net -699.22 ml   Net IO Since Admission: -499.22 mL [11/17/22 1045]  Wt Readings from Last 3 Encounters:  11/16/22 50.9 kg  09/10/22 57.2 kg  09/07/22 56.4 kg     Unresulted Labs (From admission, onward)     Start     Ordered   11/17/22 1009  Gastrointestinal Panel by PCR , Stool  (Gastrointestinal Panel by PCR, Stool                                                                                                                                                      **Does Not include CLOSTRIDIUM DIFFICILE  testing. **If CDIFF testing is needed, place order from the "C Difficile Testing" order set.**)  Once,   R        11/17/22 1008   11/17/22 1008  C Difficile Quick Screen w PCR reflex  (C Difficile quick screen w PCR reflex panel )  Once, for 24 hours,   TIMED       References:    CDiff Information Tool   11/17/22 1008   11/17/22 0902  Urine Culture (for pregnant, neutropenic or urologic patients or patients with an indwelling urinary catheter)  (Urine Labs)  Add-on,   AD       Question:  Indication  Answer:  Altered mental status (if no other cause identified)   11/17/22 0901          Data Reviewed: I have personally reviewed following labs and imaging studies CBC: Recent Labs  Lab 11/15/22 1752 11/16/22 0330  WBC 8.1 8.2  NEUTROABS 5.9  --   HGB 13.3 13.3  HCT 38.2 39.3  MCV 89.3 91.6  PLT 262 [id number]   Basic Metabolic Panel: Recent Labs  Lab 11/15/22 1752 11/16/22 0330 11/17/22 0338  NA 134* 136 134*  K 2.5* 3.1* 2.4*  CL 98 101 101  CO2 '24 23 22  '$ GLUCOSE 103* 92 87  BUN '10 10 9  '$ CREATININE 0.55 0.57 0.47  CALCIUM 8.6* 8.6* 8.6*  MG  --  1.8 1.7  PHOS  --   --  3.7   GFR: Estimated Creatinine Clearance: 48.8 mL/min (by C-G formula based on SCr of 0.47 mg/dL). Liver Function Tests: Recent Labs  Lab 11/15/22 1752  AST 20  ALT 13  ALKPHOS 64  BILITOT 1.0  PROT 7.0  ALBUMIN 3.3*   Recent Labs  Lab 11/15/22 1752  LIPASE 41   Sepsis Labs: Recent Labs  Lab 11/15/22 1752 11/16/22 0840  LATICACIDVEN 1.2 1.5    Recent Results (from the past 240 hour(s))  Resp panel by RT-PCR (RSV, Flu A&B, Covid) Anterior Nasal Swab     Status: None   Collection Time: 11/15/22  5:52 PM   Specimen: Anterior Nasal Swab  Result Value Ref Range Status   SARS Coronavirus 2 by RT PCR NEGATIVE NEGATIVE Final    Comment: (NOTE) SARS-CoV-2 target nucleic acids are NOT DETECTED.  The SARS-CoV-2 RNA is  generally detectable in upper respiratory specimens during the acute phase of infection. The lowest concentration of SARS-CoV-2 viral copies this assay can detect is 138 copies/mL. A negative result does not preclude SARS-Cov-2 infection and should not be used as the sole basis for treatment or other patient management decisions. A negative result may occur with  improper specimen collection/handling, submission of specimen other than nasopharyngeal swab, presence of viral mutation(s) within the areas targeted by this assay, and inadequate number of viral copies(<138 copies/mL). A negative result must be combined with clinical observations, patient history, and epidemiological information. The expected result is Negative.  Fact Sheet for Patients:  EntrepreneurPulse.com.au  Fact Sheet for Healthcare Providers:  IncredibleEmployment.be  This test is no t yet approved or cleared by the [place] FDA and  has been authorized for detection and/or diagnosis of SARS-CoV-2 by FDA under an Emergency Use Authorization (EUA). This EUA will remain  in effect (meaning this test can be used) for the duration of the COVID-19 declaration under Section 564(b)(1) of the Act, 21 U.S.C.section 360bbb-3(b)(1), unless the authorization is terminated  or revoked sooner.       Influenza  A by PCR NEGATIVE NEGATIVE Final   Influenza B by PCR NEGATIVE NEGATIVE Final    Comment: (NOTE) The Xpert Xpress SARS-CoV-2/FLU/RSV plus assay is intended as an aid in the diagnosis of influenza from Nasopharyngeal swab specimens and should not be used as a sole basis for treatment. Nasal washings and aspirates are unacceptable for Xpert Xpress SARS-CoV-2/FLU/RSV testing.  Fact Sheet for Patients: EntrepreneurPulse.com.au  Fact Sheet for Healthcare Providers: IncredibleEmployment.be  This test is not yet approved or cleared by the [place] FDA and has been authorized for detection and/or diagnosis of SARS-CoV-2 by FDA under an Emergency Use Authorization (EUA). This EUA will remain in effect (meaning this test can be used) for the duration of the COVID-19 declaration under Section 564(b)(1) of the Act, 21 U.S.C. section 360bbb-3(b)(1), unless the authorization is terminated or revoked.     Resp Syncytial Virus by PCR NEGATIVE NEGATIVE Final    Comment: (NOTE) Fact Sheet for Patients: EntrepreneurPulse.com.au  Fact Sheet for Healthcare Providers: IncredibleEmployment.be  This test is not yet approved or cleared by the [place] FDA and has been authorized for detection and/or diagnosis of SARS-CoV-2 by FDA under an Emergency Use Authorization (EUA). This EUA will remain in effect (meaning this test can be used) for the duration of the COVID-19 declaration under Section 564(b)(1) of the Act, 21 U.S.C. section 360bbb-3(b)(1), unless the authorization is terminated or revoked.  Performed at [facility], [place] [address] [place], [place] [address]   Culture, blood (routine x 2)     Status: None (Preliminary result)   Collection Time: 11/15/22  5:52 PM   Specimen: BLOOD  Result Value Ref Range Status   Specimen Description   Final    BLOOD LEFT ANTECUBITAL Performed at [facility] [address], [place], [place] [address]    Special Requests   Final    BOTTLES DRAWN AEROBIC AND ANAEROBIC Blood Culture adequate volume Performed at [facility] [address], [place], [place] [address]    Culture   Final    NO GROWTH 2 DAYS Performed at [facility] [address], [place], [place] [address]    Report Status PENDING  Incomplete  Culture, blood (routine x 2)     Status: None (Preliminary result)   Collection Time: 11/15/22  6:02 PM   Specimen: BLOOD  Result Value Ref Range Status   Specimen  Description   Final    BLOOD LEFT ANTECUBITAL Performed at [facility] [address], [place], [place] [address]    Special Requests   Final    BOTTLES DRAWN AEROBIC AND ANAEROBIC Blood Culture adequate volume Performed at [facility] [address], [place], [place] [address]    Culture   Final    NO GROWTH 2 DAYS Performed at [facility] [address] [place], [place] [address]    Report Status PENDING  Incomplete    Antimicrobials: Anti-infectives (From admission, onward)    None      Culture/Microbiology    Component Value Date/Time   SDES  11/15/2022 1802    BLOOD LEFT ANTECUBITAL Performed at [facility], [place] [address] [place], [place] [address]    SPECREQUEST  11/15/2022 1802    BOTTLES DRAWN AEROBIC AND ANAEROBIC Blood Culture adequate volume Performed at [facility] [address] [place],  [address]    CULT  11/15/2022 [phone]  NO GROWTH 2 DAYS Performed at [facility], [place] [address] [place], [place] [address]    REPTSTATUS PENDING 11/15/2022 1802   Other culture-see note  Radiology Studies: CT ANGIO HEAD NECK W WO CM  Result Date: 11/16/2022 CLINICAL DATA:  TIA. EXAM: CT ANGIOGRAPHY HEAD AND NECK TECHNIQUE: Multidetector CT imaging of the head and neck was performed using the standard protocol during bolus administration of intravenous contrast. Multiplanar CT image reconstructions and MIPs were obtained to evaluate the vascular anatomy. Carotid stenosis measurements (when applicable) are obtained utilizing NASCET criteria, using the distal internal carotid diameter as the denominator. RADIATION DOSE REDUCTION: This exam was performed according to the departmental dose-optimization program which includes automated exposure control, adjustment of the mA and/or kV according to patient size and/or use of iterative reconstruction technique. CONTRAST:  28m  OMNIPAQUE IOHEXOL 350 MG/ML SOLN COMPARISON:  Same day brain MRI, noncontrast CT head 1 day prior. CTA head/neck 09/06/2022 FINDINGS: CT HEAD FINDINGS Brain: There is no acute intracranial hemorrhage, extra-axial fluid collection, or acute infarct. The small subacute infarcts seen on the prior brain MRI are not seen on the current study Parenchymal volume is normal. The ventricles are normal in size. Gray-white differentiation is preserved. Remote infarcts in the bilateral cerebellar hemispheres are again seen. The pituitary and suprasellar region are normal there is no mass lesion there is no mass effect or midline shift. Vascular: See below. Skull: Normal. Negative for fracture or focal lesion. Sinuses/Orbits: The paranasal sinuses are clear. Bilateral lens implants are in place. The globes and orbits are otherwise unremarkable. Other: None. Review of the MIP images confirms the above findings CTA NECK FINDINGS Aortic arch: There is calcified plaque in the imaged aortic arch. The origins of the major branch vessels are patent. The subclavian arteries are patent to the level imaged. Right carotid system: The right common carotid artery is patent. There is mixed plaque at the bifurcation resulting in less than 50% stenosis. The distal internal carotid artery is patent. The external carotid artery is patent. There is no evidence of dissection or aneurysm. Left carotid system: The left common carotid artery is patent. There is mild plaque at the bifurcation resulting in less than 50% stenosis. The distal internal carotid artery is patent. The external carotid artery is patent. There is no evidence of dissection or aneurysm. Vertebral arteries: The vertebral arteries are patent, without hemodynamically significant stenosis or occlusion there is no evidence of dissection or aneurysm. Skeleton: There is no acute osseous abnormality or suspicious osseous lesion. Sequela of prior cervical fusion and infection is unchanged.  There is no visible canal hematoma. Other neck: The soft tissues of the neck are unremarkable. Upper chest: Biapical scarring and left upper lobe postsurgical changes and bronchiectasis are again noted. There is no acute abnormality. Review of the MIP images confirms the above findings CTA HEAD FINDINGS Anterior circulation: There is calcified plaque in the carotid siphons without hemodynamically significant stenosis. The bilateral M1 segments are patent. There is atherosclerotic irregularity of the distal branches without proximal high-grade stenosis or occlusion. The ACAs are patent, with atherosclerotic irregularity and narrowing of the distal branches but no proximal high-grade stenosis or occlusion. Findings are unchanged. There is no aneurysm or AVM. Posterior circulation: The bilateral V4 segments are patent. The basilar artery is patent. The major cerebellar arteries appear patent. The bilateral PCAs are patent, without proximal high-grade stenosis or occlusion. Bilateral posterior communicating arteries are identified. There is no aneurysm or AVM. Venous sinuses:  As permitted by contrast timing, patent. Anatomic variants: None. Review of the MIP images confirms the above findings IMPRESSION: 1. Stable noncontrast head CT with no acute intracranial pathology. 2. Stable CTA head/neck since 09/06/2022. 3. Unchanged calcified plaque at the carotid bifurcations, right worse than left, without hemodynamically significant stenosis. 4. Stable intracranial vasculature with no proximal high-grade stenosis or occlusion. Electronically Signed   By: Valetta Mole M.D.   On: 11/16/2022 16:21   MR BRAIN WO CONTRAST  Result Date: 11/16/2022 CLINICAL DATA:  Initial evaluation for altered mental status. EXAM: MRI HEAD WITHOUT CONTRAST TECHNIQUE: Multiplanar, multiecho pulse sequences of the brain and surrounding structures were obtained without intravenous contrast. COMPARISON:  Prior CT from 11/15/2022. FINDINGS: Brain:  Examination moderately to severely degraded by motion artifact. Generalized age-related cerebral atrophy. Few scattered remote bilateral cerebellar infarcts noted. Chronic microvascular ischemic disease noted within the pons. Few scattered patchy subcentimeter foci of diffusion signal abnormality are seen involving the right frontal centrum semi ovale (series 5, images 25, 26). Associated T2/FLAIR signal without visible signal loss on ADC map. Findings likely reflect small evolving subacute small vessel type infarcts. No associated hemorrhage or mass effect. No other evidence for acute or recent infarction. Gray-white matter differentiation otherwise maintained. No other visible areas of chronic cortical infarction. No visible acute or chronic intracranial blood products. No mass lesion, midline shift or mass effect. No hydrocephalus or extra-axial fluid collection. Vascular: Major intracranial vascular flow voids are maintained. Skull and upper cervical spine: Bone marrow signal intensity grossly within normal limits. No scalp soft tissue abnormality. Sinuses/Orbits: Prior bilateral ocular lens replacement. Paranasal sinuses are clear. No significant mastoid effusion. Other: None. IMPRESSION: 1. Motion degraded exam. 2. Few scattered subcentimeter foci of diffusion signal abnormality involving the right frontal centrum semi ovale, likely reflecting small evolving subacute small vessel type infarcts. No associated hemorrhage or mass effect. 3. Underlying age-related cerebral atrophy with mild chronic small vessel ischemic disease, with a few scattered remote bilateral cerebellar infarcts. Electronically Signed   By: Jeannine Boga M.D.   On: 11/16/2022 01:04   DG Abdomen 1 View  Result Date: 11/15/2022 CLINICAL DATA:  Pre MRI evaluation EXAM: ABDOMEN - 1 VIEW COMPARISON:  09/10/2022 FINDINGS: Bilateral hip replacements are noted. Degenerative changes of lumbar spine are seen. Postsurgical changes are noted  in the left upper quadrant. No radiopaque foreign body is seen. No free air is noted. IMPRESSION: No evidence of radiopaque foreign body. Electronically Signed   By: Inez Catalina M.D.   On: 11/15/2022 23:16   CT Head Wo Contrast  Result Date: 11/15/2022 CLINICAL DATA:  Altered mental status, failure to thrive EXAM: CT HEAD WITHOUT CONTRAST TECHNIQUE: Contiguous axial images were obtained from the base of the skull through the vertex without intravenous contrast. RADIATION DOSE REDUCTION: This exam was performed according to the departmental dose-optimization program which includes automated exposure control, adjustment of the mA and/or kV according to patient size and/or use of iterative reconstruction technique. COMPARISON:  10/31/2022 FINDINGS: Brain: No evidence of acute infarction, hemorrhage, hydrocephalus, extra-axial collection or mass lesion/mass effect. Mild cortical atrophy. Vascular: Mild intracranial atherosclerosis. Skull: Normal. Negative for fracture or focal lesion. Sinuses/Orbits: The visualized paranasal sinuses are essentially clear. The mastoid air cells are unopacified. Other: None. IMPRESSION: No evidence of acute intracranial abnormality. Mild cortical atrophy. Electronically Signed   By: Julian Hy M.D.   On: 11/15/2022 21:15   DG Chest Port 1 View  Result Date: 11/15/2022 CLINICAL DATA:  Weakness, failure  to thrive EXAM: PORTABLE CHEST 1 VIEW COMPARISON:  10/31/2022 FINDINGS: Suture line in the left upper lobe. Associated volume loss in left hemithorax. Right lung is clear. No pneumothorax. The heart is normal in size. Surgical clips in the right chest wall/axilla. Old left rib fracture deformities. IMPRESSION: No evidence of acute cardiopulmonary disease. Electronically Signed   By: Julian Hy M.D.   On: 11/15/2022 17:55     LOS: 0 days   Antonieta Pert, MD [facility]  11/17/2022, 10:45 AM

## 2022-11-18 DIAGNOSIS — A0811 Acute gastroenteropathy due to Norwalk agent: Secondary | ICD-10-CM | POA: Insufficient documentation

## 2022-11-18 LAB — BASIC METABOLIC PANEL
Anion gap: 7 (ref 5–15)
BUN: 8 mg/dL (ref 8–23)
CO2: 23 mmol/L (ref 22–32)
Calcium: 8.6 mg/dL — ABNORMAL LOW (ref 8.9–10.3)
Chloride: 104 mmol/L (ref 98–111)
Creatinine, Ser: 0.52 mg/dL (ref 0.44–1.00)
GFR, Estimated: >60 mL/min (ref >60)
Glucose, Bld: 99 mg/dL (ref 70–99)
Potassium: 3.5 mmol/L (ref 3.5–5.1)
Sodium: 134 mmol/L — ABNORMAL LOW (ref 135–145)

## 2022-11-18 MED ORDER — ALBUTEROL SULFATE HFA 108 (90 BASE) MCG/ACT IN AERS: 2.0000 | INHALATION_SPRAY | Freq: Four times a day (QID) | RESPIRATORY_TRACT | Status: DC | PRN

## 2022-11-18 MED ORDER — ALBUTEROL SULFATE (2.5 MG/3ML) 0.083% IN NEBU: 2.5000 mg | INHALATION_SOLUTION | Freq: Four times a day (QID) | RESPIRATORY_TRACT | Status: DC | PRN

## 2022-11-18 MED ORDER — GABAPENTIN 600 MG PO TABS: 600.0000 mg | ORAL_TABLET | Freq: Four times a day (QID) | ORAL | Status: DC

## 2022-11-18 MED ORDER — METHOCARBAMOL 500 MG PO TABS
500.0000 mg | ORAL_TABLET | Freq: Every day | ORAL | Status: DC | PRN
  Administered 2022-11-18: 500 mg via ORAL
  Filled 2022-11-18: qty 1

## 2022-11-18 MED ORDER — GABAPENTIN 300 MG PO CAPS
300.0000 mg | ORAL_CAPSULE | Freq: Four times a day (QID) | ORAL | Status: DC
  Administered 2022-11-18 – 2022-11-19 (×5): 300 mg via ORAL
  Filled 2022-11-18 (×5): qty 1

## 2022-11-18 MED ORDER — VITAMIN C 500 MG PO TABS
500.0000 mg | ORAL_TABLET | Freq: Every day | ORAL | Status: DC
  Administered 2022-11-18 – 2022-11-19 (×2): 500 mg via ORAL
  Filled 2022-11-18 (×2): qty 1

## 2022-11-18 MED ORDER — VITAMIN D 25 MCG (1000 UNIT) PO TABS
1000.0000 [IU] | ORAL_TABLET | Freq: Every day | ORAL | Status: DC
  Administered 2022-11-18 – 2022-11-19 (×2): 1000 [IU] via ORAL
  Filled 2022-11-18 (×2): qty 1

## 2022-11-18 MED ORDER — DICLOFENAC SODIUM 75 MG PO TBEC: 75.0000 mg | DELAYED_RELEASE_TABLET | Freq: Two times a day (BID) | ORAL | Status: DC | PRN

## 2022-11-18 MED ORDER — MORPHINE SULFATE ER 15 MG PO TBCR
15.0000 mg | EXTENDED_RELEASE_TABLET | Freq: Two times a day (BID) | ORAL | Status: DC
  Administered 2022-11-18 – 2022-11-19 (×3): 15 mg via ORAL
  Filled 2022-11-18 (×3): qty 1

## 2022-11-18 NOTE — Progress Notes (Signed)
PROGRESS NOTE [name]  [id number] DOB: 07-16-1946 DOA: 11/15/2022 PCP: [name], FNP  Brief Narrative/Hospital Course: 77 y.o. female with medical history significant of CAD, HTN, HLD, stroke, chronic pain on chronic opiate who was admitted in Nov for AMS + AKI, felt to be due to polypharmacy including opiates and in Dec for multifocal acute strokes, felt by neurology to be due to CVD and hypotension in setting of polypharmacy including opiates and again recent admission 2/16 - 2/18 for AMS / lethargy and confusion which was, once again, felt to be due to ongoing polypharmacy.  She was ultimately restarted on a reduced dose of pain meds for her chronic pain at time of discharge.  Since discharge, her husband reports that she went back to using exact same dosing that was felt to be the cause of her AMS prior to her admission 2 weeks ago.  And he reports that she has not reduced her pain medication usage, as instructed, at all. For the past 4 days she has been lethargic at home.  Ultimately sent in to ER for lethargy and confusion. Patient reports that she feels weak. She is otherwise without complaint. She denies chest pain, shortness of breath, nausea, vomiting, abdominal pain, fevers.  She states she is "not sure" how much pain medication she is taking at home (was clearly confused at this time).  Patient admitted for acute encephalopathy with lethargy altered mental status with polypharmacy, chronic pain diabetes essential hypertension hyperlipidemia     Subjective: Seen and examined.  Alert awake confused about date, thinks Tawni Pummel is the president Complains of back pain asking for pain medication Overnight afebrile BP in [id number] to [id number] systolic, on room air GI panel tested positive for norovirus last night labs Worked with PT OT yesterday Nursing reports diarrhea is better  Assessment and Plan: Principal Problem:   Acute encephalopathy Active Problems:   Chronic pain    Hyperlipidemia   Essential hypertension   DM2 (diabetes mellitus, type 2) ([place])   Acute metabolic encephalopathy   Acute gastroenteropathy due to Norovirus   Lethargy/altered mental status loss acute metabolic/toxic encephalopathy: multifactorial suspect due opiate use.  UA grossly abnormal question if she has UTI urine culture pending.previous similar admissions 2/2 opiate. She has normal TSH, UDS positive for opiates.She will need clear instruction regarding her opiate use at home and will need close follow-up with PCP and pain management  Concern for stroke with few scattered subcentimeter foci of diffusion signal abnormality in the MRI: Neuro has been consulted Dr. Cheral Marker reviewed and I discussed with him 11/18/22 :"The punctate findings on MRI actually look more consistent with artifact and He doesn't think that these are actual strokes (overcalled by Radiology). And no further work up is needed. . Of note  LDL 101, CT angio head and neck 3/3-stable no intracranial pathology, stable CTA head and neck, unchanged calcified plaque at the carotid bifurcation-she did have echo on 09/06/22. Cont on telemetry PT OT speech On aspirin 81 mg, Crestor 10 mg.  ?  UTI:UA abnormal pending urine culture holding off on antibiotics   Acute norovirus gastroenteritis with diarrhea C. difficile was negative supportive care enteric precaution has been ordered at the time of testing   Chronic opioid use/chronic pain: She takes MS Contin 30 mg 4 times a day and oxycodone 15 mg 4 times a day.  Resuming OxyContin 50 mg twice daily, Robaxin, diclofenac, Neurontin at lower dose. She has had previous similar admissions with altered mental status  in the setting of excessive opioid use.  She will need clear instruction regarding her opiate use at home and will need close follow-up with PCP and pain management  Diabetes mellitus: Blood sugar is controlled, continue SSI hold metformin Recent Labs  Lab 11/15/22 1813  GLUCAP  108*  Hypokalemia replaced aggressively> improving, repeat labs improved Hypomagnesemia repleted Recent Labs  Lab 11/15/22 1752 11/16/22 0330 11/17/22 0338 11/17/22 1502 11/18/22 0840  K 2.5* 3.1* 2.4* 3.4* 3.5  CALCIUM 8.6* 8.6* 8.6* 8.5* 8.6*  MG  --  1.8 1.7  --   --   PHOS  --   --  3.7  --   --      Hypertension: Fairly controlled on losartan Hyperlipidemia: Continue Crestor  DVT prophylaxis: enoxaparin (LOVENOX) injection 40 mg Start: 11/16/22 1000 Code Status:   Code Status: Full Code Family Communication: plan of care discussed with patient at bedside. Patient status is:  inpatient because of diarrhea, hypokalemia, altered mental status Level of care: Telemetry   Dispo: The patient is from: home            Anticipated disposition: Home health PT OT 1-2 days  Objective: Vitals last 24 hrs: Vitals:   11/18/22 0353 11/18/22 0732 11/18/22 0734 11/18/22 0957  BP: (!) 160/78  (!) 159/80   Pulse: 69  66   Resp:  (!) 23 (!) 22 (!) 22  Temp: 98.7 F (37.1 C)  97.7 F (36.5 C)   TempSrc: Oral  Oral   SpO2: 97%     Weight:      Height:       Weight change:   Physical Examination: General exam: alert awake oriented x1-2,older than stated age HEENT:Oral mucosa moist, Ear/Nose WNL grossly Respiratory system: Bilaterally clear BS, no use of accessory muscle Cardiovascular system: S1 & S2 +, No JVD. Gastrointestinal system: Abdomen soft,NT,ND, BS+ Nervous System: Alert, awake, moving extremities, she follows commands. Extremities: LE edema neg,distal peripheral pulses palpable.  Skin: No rashes,no icterus. MSK: Normal muscle bulk,tone, power  Medications reviewed:  Scheduled Meds:  ascorbic acid  500 mg Oral Daily   aspirin EC  81 mg Oral Daily   cholecalciferol  1,000 Units Oral Daily   enoxaparin (LOVENOX) injection  40 mg Subcutaneous Q24H   gabapentin  300 mg Oral QID   losartan  50 mg Oral Daily   morphine  15 mg Oral Q12H   rosuvastatin  10 mg Oral Daily    Continuous Infusions:  0.9 % NaCl with KCl 20 mEq / L 75 mL/hr at 11/18/22 0732      Diet Order             Diet full liquid Fluid consistency: Thin  Diet effective now                   Intake/Output Summary (Last 24 hours) at 11/18/2022 1102 Last data filed at 11/18/2022 1013 Gross per 24 hour  Intake 2515.69 ml  Output 1650 ml  Net 865.69 ml   Net IO Since Admission: 26.47 mL [11/18/22 1102]  Wt Readings from Last 3 Encounters:  11/16/22 50.9 kg  09/10/22 57.2 kg  09/07/22 56.4 kg     Unresulted Labs (From admission, onward)     Start     Ordered   11/19/22 [id number]  Basic metabolic panel  Tomorrow morning,   R        11/18/22 0802   11/17/22 0902  Urine Culture (for pregnant, neutropenic or  urologic patients or patients with an indwelling urinary catheter)  (Urine Labs)  Add-on,   AD       Question:  Indication  Answer:  Altered mental status (if no other cause identified)   11/17/22 0901          Data Reviewed: I have personally reviewed following labs and imaging studies CBC: Recent Labs  Lab 11/15/22 1752 11/16/22 0330  WBC 8.1 8.2  NEUTROABS 5.9  --   HGB 13.3 13.3  HCT 38.2 39.3  MCV 89.3 91.6  PLT 262 [id number]   Basic Metabolic Panel: Recent Labs  Lab 11/15/22 1752 11/16/22 0330 11/17/22 0338 11/17/22 1502 11/18/22 0840  NA 134* 136 134* 132* 134*  K 2.5* 3.1* 2.4* 3.4* 3.5  CL 98 101 101 103 104  CO2 '24 23 22 '$ 21* 23  GLUCOSE 103* 92 87 131* 99  BUN '10 10 9 10 8  '$ CREATININE 0.55 0.57 0.47 0.56 0.52  CALCIUM 8.6* 8.6* 8.6* 8.5* 8.6*  MG  --  1.8 1.7  --   --   PHOS  --   --  3.7  --   --    GFR: Estimated Creatinine Clearance: 48.8 mL/min (by C-G formula based on SCr of 0.52 mg/dL). Liver Function Tests: Recent Labs  Lab 11/15/22 1752  AST 20  ALT 13  ALKPHOS 64  BILITOT 1.0  PROT 7.0  ALBUMIN 3.3*   Recent Labs  Lab 11/15/22 1752  LIPASE 41   Sepsis Labs: Recent Labs  Lab 11/15/22 1752 11/16/22 0840  LATICACIDVEN 1.2  1.5    Recent Results (from the past 240 hour(s))  Resp panel by RT-PCR (RSV, Flu A&B, Covid) Anterior Nasal Swab     Status: None   Collection Time: 11/15/22  5:52 PM   Specimen: Anterior Nasal Swab  Result Value Ref Range Status   SARS Coronavirus 2 by RT PCR NEGATIVE NEGATIVE Final    Comment: (NOTE) SARS-CoV-2 target nucleic acids are NOT DETECTED.  The SARS-CoV-2 RNA is generally detectable in upper respiratory specimens during the acute phase of infection. The lowest concentration of SARS-CoV-2 viral copies this assay can detect is 138 copies/mL. A negative result does not preclude SARS-Cov-2 infection and should not be used as the sole basis for treatment or other patient management decisions. A negative result may occur with  improper specimen collection/handling, submission of specimen other than nasopharyngeal swab, presence of viral mutation(s) within the areas targeted by this assay, and inadequate number of viral copies(<138 copies/mL). A negative result must be combined with clinical observations, patient history, and epidemiological information. The expected result is Negative.  Fact Sheet for Patients:  EntrepreneurPulse.com.au  Fact Sheet for Healthcare Providers:  IncredibleEmployment.be  This test is no t yet approved or cleared by the [place] FDA and  has been authorized for detection and/or diagnosis of SARS-CoV-2 by FDA under an Emergency Use Authorization (EUA). This EUA will remain  in effect (meaning this test can be used) for the duration of the COVID-19 declaration under Section 564(b)(1) of the Act, 21 U.S.C.section 360bbb-3(b)(1), unless the authorization is terminated  or revoked sooner.       Influenza A by PCR NEGATIVE NEGATIVE Final   Influenza B by PCR NEGATIVE NEGATIVE Final    Comment: (NOTE) The Xpert Xpress SARS-CoV-2/FLU/RSV plus assay is intended as an aid in the diagnosis of influenza from  Nasopharyngeal swab specimens and should not be used as a sole basis for treatment. Nasal washings and  aspirates are unacceptable for Xpert Xpress SARS-CoV-2/FLU/RSV testing.  Fact Sheet for Patients: EntrepreneurPulse.com.au  Fact Sheet for Healthcare Providers: IncredibleEmployment.be  This test is not yet approved or cleared by the [place] FDA and has been authorized for detection and/or diagnosis of SARS-CoV-2 by FDA under an Emergency Use Authorization (EUA). This EUA will remain in effect (meaning this test can be used) for the duration of the COVID-19 declaration under Section 564(b)(1) of the Act, 21 U.S.C. section 360bbb-3(b)(1), unless the authorization is terminated or revoked.     Resp Syncytial Virus by PCR NEGATIVE NEGATIVE Final    Comment: (NOTE) Fact Sheet for Patients: EntrepreneurPulse.com.au  Fact Sheet for Healthcare Providers: IncredibleEmployment.be  This test is not yet approved or cleared by the [place] FDA and has been authorized for detection and/or diagnosis of SARS-CoV-2 by FDA under an Emergency Use Authorization (EUA). This EUA will remain in effect (meaning this test can be used) for the duration of the COVID-19 declaration under Section 564(b)(1) of the Act, 21 U.S.C. section 360bbb-3(b)(1), unless the authorization is terminated or revoked.  Performed at [facility], [place] [address], [place], [place] [address]   Culture, blood (routine x 2)     Status: None (Preliminary result)   Collection Time: 11/15/22  5:52 PM   Specimen: BLOOD  Result Value Ref Range Status   Specimen Description   Final    BLOOD LEFT ANTECUBITAL Performed at [facility] [address], [place], [place] [address]    Special Requests   Final    BOTTLES DRAWN AEROBIC AND ANAEROBIC Blood Culture adequate volume Performed at [facility] [address] [place], [place] [address]    Culture   Final    NO GROWTH 3 DAYS Performed at [facility], [place] [address], [place], [place] [address]    Report Status PENDING  Incomplete  Culture, blood (routine x 2)     Status: None (Preliminary result)   Collection Time: 11/15/22  6:02 PM   Specimen: BLOOD  Result Value Ref Range Status   Specimen Description   Final    BLOOD LEFT ANTECUBITAL Performed at [facility] [address], [place], [place] [address]    Special Requests   Final    BOTTLES DRAWN AEROBIC AND ANAEROBIC Blood Culture adequate volume Performed at [facility] [address], [place], [place] [address]    Culture   Final    NO GROWTH 3 DAYS Performed at [facility], [place] [address] [place],  [address]    Report Status PENDING  Incomplete  C Difficile Quick Screen w PCR reflex     Status: None   Collection Time: 11/17/22 10:13 AM   Specimen: STOOL  Result Value Ref Range Status   C Diff antigen NEGATIVE NEGATIVE Final   C Diff toxin NEGATIVE NEGATIVE Final   C Diff interpretation No C. difficile detected.  Final    Comment: Performed at [facility], [place] [address], [place],  [address]  Gastrointestinal Panel by PCR , Stool     Status: Abnormal   Collection Time: 11/17/22 10:13 AM   Specimen: STOOL  Result Value Ref Range Status   Campylobacter species NOT DETECTED NOT DETECTED Final   Plesimonas shigelloides NOT DETECTED NOT DETECTED Final   Salmonella species NOT DETECTED NOT DETECTED Final   Yersinia enterocolitica NOT DETECTED NOT DETECTED Final   Vibrio species NOT  DETECTED NOT DETECTED Final   Vibrio cholerae NOT DETECTED NOT DETECTED Final   Enteroaggregative E coli (EAEC) NOT DETECTED NOT DETECTED Final   Enteropathogenic E coli (EPEC) NOT DETECTED NOT DETECTED Final   Enterotoxigenic E coli (ETEC) NOT DETECTED NOT DETECTED Final   Shiga like  toxin producing E coli (STEC) NOT DETECTED NOT DETECTED Final   Shigella/Enteroinvasive E coli (EIEC) NOT DETECTED NOT DETECTED Final   Cryptosporidium NOT DETECTED NOT DETECTED Final   Cyclospora cayetanensis NOT DETECTED NOT DETECTED Final   Entamoeba histolytica NOT DETECTED NOT DETECTED Final   Giardia lamblia NOT DETECTED NOT DETECTED Final   Adenovirus F40/41 NOT DETECTED NOT DETECTED Final   Astrovirus NOT DETECTED NOT DETECTED Final   Norovirus GI/GII DETECTED (A) NOT DETECTED Final    Comment: RESULT CALLED TO, READ BACK BY AND VERIFIED WITH: DREW ALQUIRIA AT 2117 ON 11/17/22 BY SS    Rotavirus A NOT DETECTED NOT DETECTED Final   Sapovirus (I, II, IV, and V) NOT DETECTED NOT DETECTED Final    Comment: Performed at [facility], [address] [place], [place] [address]    Antimicrobials: Anti-infectives (From admission, onward)    None      Culture/Microbiology    Component Value Date/Time   SDES  11/15/2022 1802    BLOOD LEFT ANTECUBITAL Performed at [facility], [place] [address], [place], [place] [address]    SPECREQUEST  11/15/2022 1802    BOTTLES DRAWN AEROBIC AND ANAEROBIC Blood Culture adequate volume Performed at [facility] [address], [place], [place] [address]    CULT  11/15/2022 1802    NO GROWTH 3 DAYS Performed at [facility] [address], [place], [place] [address]    REPTSTATUS PENDING 11/15/2022 1802   Other culture-see note  Radiology Studies: CT ANGIO HEAD NECK W WO CM  Result Date: 11/16/2022 CLINICAL DATA:  TIA. EXAM: CT ANGIOGRAPHY HEAD AND NECK TECHNIQUE: Multidetector CT imaging of the head and neck was performed using the standard protocol during bolus administration of intravenous contrast. Multiplanar CT image reconstructions and MIPs were obtained to evaluate the vascular anatomy. Carotid stenosis measurements (when applicable) are obtained utilizing NASCET criteria, using the  distal internal carotid diameter as the denominator. RADIATION DOSE REDUCTION: This exam was performed according to the departmental dose-optimization program which includes automated exposure control, adjustment of the mA and/or kV according to patient size and/or use of iterative reconstruction technique. CONTRAST:  18m OMNIPAQUE IOHEXOL 350 MG/ML SOLN COMPARISON:  Same day brain MRI, noncontrast CT head 1 day prior. CTA head/neck 09/06/2022 FINDINGS: CT HEAD FINDINGS Brain: There is no acute intracranial hemorrhage, extra-axial fluid collection, or acute infarct. The small subacute infarcts seen on the prior brain MRI are not seen on the current study Parenchymal volume is normal. The ventricles are normal in size. Gray-white differentiation is preserved. Remote infarcts in the bilateral cerebellar hemispheres are again seen. The pituitary and suprasellar region are normal there is no mass lesion there is no mass effect or midline shift. Vascular: See below. Skull: Normal. Negative for fracture or focal lesion. Sinuses/Orbits: The paranasal sinuses are clear. Bilateral lens implants are in place. The globes and orbits are otherwise unremarkable. Other: None. Review of the MIP images confirms the above findings CTA NECK FINDINGS Aortic arch: There is calcified plaque in the imaged aortic arch. The origins of the major branch vessels are patent. The subclavian arteries are patent to the level imaged. Right  carotid system: The right common carotid artery is patent. There is mixed plaque at the bifurcation resulting in less than 50% stenosis. The distal internal carotid artery is patent. The external carotid artery is patent. There is no evidence of dissection or aneurysm. Left carotid system: The left common carotid artery is patent. There is mild plaque at the bifurcation resulting in less than 50% stenosis. The distal internal carotid artery is patent. The external carotid artery is patent. There is no evidence  of dissection or aneurysm. Vertebral arteries: The vertebral arteries are patent, without hemodynamically significant stenosis or occlusion there is no evidence of dissection or aneurysm. Skeleton: There is no acute osseous abnormality or suspicious osseous lesion. Sequela of prior cervical fusion and infection is unchanged. There is no visible canal hematoma. Other neck: The soft tissues of the neck are unremarkable. Upper chest: Biapical scarring and left upper lobe postsurgical changes and bronchiectasis are again noted. There is no acute abnormality. Review of the MIP images confirms the above findings CTA HEAD FINDINGS Anterior circulation: There is calcified plaque in the carotid siphons without hemodynamically significant stenosis. The bilateral M1 segments are patent. There is atherosclerotic irregularity of the distal branches without proximal high-grade stenosis or occlusion. The ACAs are patent, with atherosclerotic irregularity and narrowing of the distal branches but no proximal high-grade stenosis or occlusion. Findings are unchanged. There is no aneurysm or AVM. Posterior circulation: The bilateral V4 segments are patent. The basilar artery is patent. The major cerebellar arteries appear patent. The bilateral PCAs are patent, without proximal high-grade stenosis or occlusion. Bilateral posterior communicating arteries are identified. There is no aneurysm or AVM. Venous sinuses: As permitted by contrast timing, patent. Anatomic variants: None. Review of the MIP images confirms the above findings IMPRESSION: 1. Stable noncontrast head CT with no acute intracranial pathology. 2. Stable CTA head/neck since 09/06/2022. 3. Unchanged calcified plaque at the carotid bifurcations, right worse than left, without hemodynamically significant stenosis. 4. Stable intracranial vasculature with no proximal high-grade stenosis or occlusion. Electronically Signed   By: Valetta Mole M.D.   On: 11/16/2022 16:21      LOS: 1 day   Antonieta Pert, MD [facility]  11/18/2022, 11:02 AM

## 2022-11-18 NOTE — Progress Notes (Signed)
Patient complains of 10/10 pain, Tylenol offered.   Patient also reports she would like to go home. Hospitalist informed.    Bed alarm on, call bell within reach.

## 2022-11-19 DIAGNOSIS — A0811 Acute gastroenteropathy due to Norwalk agent: Secondary | ICD-10-CM

## 2022-11-19 DIAGNOSIS — G9341 Metabolic encephalopathy: Secondary | ICD-10-CM

## 2022-11-19 LAB — BASIC METABOLIC PANEL WITH GFR
Anion gap: 4 — ABNORMAL LOW (ref 5–15)
BUN: 9 mg/dL (ref 8–23)
CO2: 24 mmol/L (ref 22–32)
Calcium: 8.4 mg/dL — ABNORMAL LOW (ref 8.9–10.3)
Chloride: 109 mmol/L (ref 98–111)
Creatinine, Ser: 0.64 mg/dL (ref 0.44–1.00)
GFR, Estimated: >60 mL/min
Glucose, Bld: 95 mg/dL (ref 70–99)
Potassium: 3.7 mmol/L (ref 3.5–5.1)
Sodium: 137 mmol/L (ref 135–145)

## 2022-11-19 LAB — CBC WITH DIFFERENTIAL/PLATELET
Abs Immature Granulocytes: 0.02 K/uL (ref 0.00–0.07)
Basophils Absolute: 0.1 K/uL (ref 0.0–0.1)
Basophils Relative: 1 %
Eosinophils Absolute: 0.2 K/uL (ref 0.0–0.5)
Eosinophils Relative: 3 %
HCT: 36.7 % (ref 36.0–46.0)
Hemoglobin: 12.2 g/dL (ref 12.0–15.0)
Immature Granulocytes: 0 %
Lymphocytes Relative: 35 %
Lymphs Abs: 2.2 K/uL (ref 0.7–4.0)
MCH: 31 pg (ref 26.0–34.0)
MCHC: 33.2 g/dL (ref 30.0–36.0)
MCV: 93.1 fL (ref 80.0–100.0)
Monocytes Absolute: 0.5 K/uL (ref 0.1–1.0)
Monocytes Relative: 8 %
Neutro Abs: 3.3 K/uL (ref 1.7–7.7)
Neutrophils Relative %: 53 %
Platelets: 201 K/uL (ref 150–400)
RBC: 3.94 MIL/uL (ref 3.87–5.11)
RDW: 14.7 % (ref 11.5–15.5)
WBC: 6.3 K/uL (ref 4.0–10.5)
nRBC: 0 % (ref 0.0–0.2)

## 2022-11-19 LAB — PHOSPHORUS: Phosphorus: 3.7 mg/dL (ref 2.5–4.6)

## 2022-11-19 LAB — MAGNESIUM: Magnesium: 1.8 mg/dL (ref 1.7–2.4)

## 2022-11-19 LAB — URINE CULTURE: Culture: >=100000 — AB

## 2022-11-19 MED ORDER — MORPHINE SULFATE ER 15 MG PO TBCR: 15.0000 mg | EXTENDED_RELEASE_TABLET | Freq: Two times a day (BID) | ORAL | 0 refills | Status: AC

## 2022-11-19 MED ORDER — FLUCONAZOLE 100 MG PO TABS
100.0000 mg | ORAL_TABLET | Freq: Once | ORAL | Status: AC
  Administered 2022-11-19: 100 mg via ORAL
  Filled 2022-11-19: qty 1

## 2022-11-19 MED ORDER — ASPIRIN 81 MG PO TBEC: 81.0000 mg | DELAYED_RELEASE_TABLET | Freq: Every day | ORAL | 12 refills | Status: AC

## 2022-11-19 MED ORDER — MAGNESIUM SULFATE 2 GM/50ML IV SOLN
2.0000 g | Freq: Once | INTRAVENOUS | Status: AC
  Administered 2022-11-19: 2 g via INTRAVENOUS
  Filled 2022-11-19: qty 50

## 2022-11-19 MED ORDER — ONDANSETRON HCL 4 MG PO TABS: 4.0000 mg | ORAL_TABLET | Freq: Four times a day (QID) | ORAL | 0 refills | Status: AC | PRN

## 2022-11-19 NOTE — Progress Notes (Signed)
Occupational Therapy Treatment Patient Details Name: [name] MRN: [id number] DOB: [birth year] Today's Date: 11/19/2022   History of present illness 77 yo female admitted with acute encephalopathy/AMS, FTT, weakness. Ultimately sent in to ER from home for lethargy and confusion. Medical hx of CAD, COPD, breast CA s/p mastectomy, MI, Bil THR, PE, DM, Chronic pain syndrome with back pain, and ETOH abuse.   OT comments  Patient progressing and showed improved ability to perform standing ADLs without AD or external assistance except occasional Min guard for safety, compared to previous session. Pt also showed improved activity tolerance by standing for bathing and to assist CNA with making her bed. Patient remains quick to fatigue and impulsive and  limited by chronic pain, generalized weakness and decreased activity tolerance along with deficits noted below.   Pt continues to demonstrate fair rehab potential and would benefit from continued skilled OT to increase safety and independence with ADLs and functional transfers to allow pt to return home safely and reduce caregiver burden and fall risk. Unfortunately, pt reports plan to sign herself out today.  Hoping that [facility] therapy will be permitted to continue working with pt to optimize safety and reduce chance of re-hospitalization.     Recommendations for follow up therapy are one component of a multi-disciplinary discharge planning process, led by the attending physician.  Recommendations may be updated based on patient status, additional functional criteria and insurance authorization.    Follow Up Recommendations  Home health OT     Assistance Recommended at Discharge Frequent or constant Supervision/Assistance  Patient can return home with the following  A little help with walking and/or transfers;A little help with bathing/dressing/bathroom;Assistance with cooking/housework;Direct supervision/assist for medications management;Assist for  transportation;Help with stairs or ramp for entrance;Direct supervision/assist for financial management   Equipment Recommendations  None recommended by OT    Recommendations for Other Services      Precautions / Restrictions Precautions Precautions: Fall Restrictions Weight Bearing Restrictions: No       Mobility Bed Mobility Overal bed mobility: Modified Independent Bed Mobility: Supine to Sit, Sit to Supine     Supine to sit: Modified independent (Device/Increase time), HOB elevated Sit to supine: Modified independent (Device/Increase time), HOB elevated        Transfers                         Balance     Sitting balance-Leahy Scale: Good     Standing balance support: No upper extremity supported, During functional activity Standing balance-Leahy Scale: Fair                             ADL either performed or assessed with clinical judgement   ADL Overall ADL's : Needs assistance/impaired     Grooming: Supervision/safety;Standing Grooming Details (indicate cue type and reason): Washed face and hands while performing sponge bath, standing from [facility] often. Upper Body Bathing: Set up;Standing;Supervision/ safety   Lower Body Bathing: Min guard;Sitting/lateral leans;Sit to/from stand   Upper Body Dressing : Set up;Sitting Upper Body Dressing Details (indicate cue type and reason): Assist for lines only.   Lower Body Dressing Details (indicate cue type and reason): Pt declined to address bathing or dressing to feet stating, "They're clean". Toilet Transfer: Armed forces technical officer Details (indicate cue type and reason): Pt stood from EOB with supervision and no AD. Pt pivoting between BSC and EOB for bathing. Toileting- Clothing  Manipulation and Hygiene: Min guard;Sit to/from stand Toileting - Clothing Manipulation Details (indicate cue type and reason): Pt able to stand and wash all peri areas with  supervision to Min guard for safety.     Functional mobility during ADLs: Supervision/safety;Min guard General ADL Comments: IADL note: While pt standing and bathing, pt assisted CNA with stripping and making her bed without any loss of balance and without AD.    Extremity/Trunk Assessment Upper Extremity Assessment Upper Extremity Assessment: Overall WFL for tasks assessed   Lower Extremity Assessment Lower Extremity Assessment: Generalized weakness   Cervical / Trunk Assessment Cervical / Trunk Assessment: Kyphotic Cervical / Trunk Exceptions: Severe appearing scoliosis/kyphosis    Vision   Vision Assessment?: No apparent visual deficits   Perception     Praxis      Cognition Arousal/Alertness: Awake/alert Behavior During Therapy: WFL for tasks assessed/performed, Impulsive Overall Cognitive Status: No family/caregiver present to determine baseline cognitive functioning                                 General Comments: Remains impulisve but showing improved mentation. States, "Of course I was confused, I had a fever." Pt denying that pain medication use at home contributed to her admission.        Exercises Other Exercises Other Exercises: Pt educated on non-pharmacological pain mangement techniquyes including chair tai chi, which pt reports her daughter does so she is somewhat familiar. Discussed distration and use of free youtube videos on pt's smart phone for medication and guided visualization spcifically connected to pain management. Pt eagerly agreed that she would try these things at home. Cautioned pt re: taking too many pain pills with risk of re-hospitalization. Pt responded, "I'm not coming back."    Shoulder Instructions       General Comments      Pertinent Vitals/ Pain       Pain Assessment Pain Assessment: Faces Faces Pain Scale: No hurt Pain Intervention(s): Other (comment) (See education on pain management techniques under Misc  exercises.)  Home Living                                          Prior Functioning/Environment              Frequency  Min 2X/week        Progress Toward Goals  OT Goals(current goals can now be found in the care plan section)  Progress towards OT goals: Progressing toward goals  Acute Rehab OT Goals Patient Stated Goal: Pt reports that she is signing herself out today. OT Goal Formulation: With patient Time For Goal Achievement: 12/01/22 Potential to Achieve Goals: Fair  Plan      Co-evaluation                 AM-PAC OT "6 Clicks" Daily Activity     Outcome Measure   Help from another person eating meals?: None Help from another person taking care of personal grooming?: A Little Help from another person toileting, which includes using toliet, bedpan, or urinal?: A Little Help from another person bathing (including washing, rinsing, drying)?: A Little Help from another person to put on and taking off regular upper body clothing?: A Little Help from another person to put on and taking off regular lower body clothing?: A Lot 6  Click Score: 18    End of Session Equipment Utilized During Treatment:  (None)  OT Visit Diagnosis: Pain;Unsteadiness on feet (R26.81) Pain - part of body:  (back, chronic)   Activity Tolerance Patient tolerated treatment well   Patient Left in bed;with call bell/phone within reach;with bed alarm set   Nurse Communication Other (comment) (CNA assisting with bed change.)        Time: 1013-1040 OT Time Calculation (min): 27 min  Charges: OT General Charges $OT Visit: 1 Visit OT Treatments $Self Care/Home Management : 8-22 mins $Therapeutic Activity: 8-22 mins  Anderson Malta, [place] Office: [phone] 11/19/2022  Julien Girt 11/19/2022, 10:53 AM

## 2022-11-19 NOTE — TOC Transition Note (Signed)
Transition of Care [facility]) - CM/SW Discharge Note   Patient Details  Name: [name] MRN: [id number] Date of Birth: 09-05-1946  [facility] CM/SW Contact:  Illene Regulus, LCSW Phone Number: 11/19/2022, 2:42 PM   Clinical Narrative:    CSW spoke with pt regarding home health services. Pt has declined , report she will follow up with her PCP. No additional needs TOC sign off.     Final next level of care: Home/Self Care     Patient Goals and CMS Choice      Discharge Placement                         Discharge Plan and Services Additional resources added to the After Visit Summary for                                       Social Determinants of Health (SDOH) Interventions SDOH Screenings   Food Insecurity: No Food Insecurity (11/16/2022)  Housing: Low Risk  (11/16/2022)  Transportation Needs: No Transportation Needs (11/16/2022)  Utilities: Not At Risk (11/16/2022)  Tobacco Use: High Risk (11/16/2022)     Readmission Risk Interventions    11/02/2022   12:01 PM  Readmission Risk Prevention Plan  Transportation Screening Complete  PCP or Specialist Appt within 5-7 Days Complete  Home Care Screening Complete  Medication Review (RN CM) Complete

## 2022-11-19 NOTE — Discharge Summary (Signed)
Physician Discharge Summary   Patient: [name] MRN: [id number] DOB: 03/25/1946  Admit date:     11/15/2022  Discharge date: 11/19/22  Discharge Physician: Kerney Elbe   PCP: [name], FNP   Recommendations at discharge:   Follow-up with PCP within 1 to 2 weeks and repeat CBC, CMP, mag, Phos within 1 week Follow-up with pain specialist within 1 week  Discharge Diagnoses: Principal Problem:   Acute encephalopathy Active Problems:   Chronic pain   Hyperlipidemia   Essential hypertension   DM2 (diabetes mellitus, type 2) ([name])   Acute metabolic encephalopathy   Acute gastroenteropathy due to Norovirus  Resolved Problems:   * No resolved hospital problems. *  Hospital Course: 77 y.o. female with medical history significant of CAD, HTN, HLD, stroke, chronic pain on chronic opiate who was admitted in Nov for AMS + AKI, felt to be due to polypharmacy including opiates and in Dec for multifocal acute strokes, felt by neurology to be due to CVD and hypotension in setting of polypharmacy including opiates and again recent admission 2/16 - 2/18 for AMS / lethargy and confusion which was, once again, felt to be due to ongoing polypharmacy.  She was ultimately restarted on a reduced dose of pain meds for her chronic pain at time of discharge.  Since discharge, her husband reports that she went back to using exact same dosing that was felt to be the cause of her AMS prior to her admission 2 weeks ago.  And he reports that she has not reduced her pain medication usage, as instructed, at all. For the past 4 days she has been lethargic at home.  Ultimately sent in to ER for lethargy and confusion. Patient reports that she feels weak. She is otherwise without complaint. She denies chest pain, shortness of breath, nausea, vomiting, abdominal pain, fevers.  She states she is "not sure" how much pain medication she is taking at home (was clearly confused at this time).  Patient  admitted for acute encephalopathy with lethargy altered mental status with polypharmacy, chronic pain diabetes essential hypertension hyperlipidemia    Assessment and Plan:  Lethargy/altered mental status loss acute metabolic/toxic encephalopathy, improved -Multifactorial suspect due opiate use.  UA grossly abnormal question if she has UTI urine culture pending.previous similar admissions 2/2 opiate. She has normal TSH, UDS positive for opiates.She will need clear instruction regarding her opiate use at home and will need close follow-up with PCP and pain management and I have called to have her follow-up.  Will just discharge her on 50 mg of MS Contin twice daily   Concern for stroke with few scattered subcentimeter foci of diffusion signal abnormality in the MRI: Neuro has been consulted Dr. Cheral Marker reviewed and Dr. Myriam Jacobson discussed with him 11/18/22 :"The punctate findings on MRI actually look more consistent with artifact and He doesn't think that these are actual strokes (overcalled by Radiology). And no further work up is needed. . Of note  LDL 101, CT angio head and neck 3/3-stable no intracranial pathology, stable CTA head and neck, unchanged calcified plaque at the carotid bifurcation-she did have echo on 09/06/22. Cont on telemetry PT OT speech On aspirin 81 mg, Crestor 10 mg.  Will continue   ? E Coli UTI -UA was not very convincing for UTI and urine culture did grow out greater than 100,000 coliform months of E. coli however this is likely bacteriuria given that she does not really have symptoms   Acute norovirus gastroenteritis  with diarrhea C. difficile was negative supportive care enteric precaution has been ordered at the time of testing and she is improved   Chronic opioid use/chronic pain: She takes MS Contin 30 mg 4 times a day and oxycodone 15 mg 4 times a day.  Resuming MS Contin at 15 twice daily, Robaxin, diclofenac, Neurontin at lower dose. She has had previous similar admissions  with altered mental status in the setting of excessive opioid use.  She will need clear instruction regarding her opiate use at home and will need close follow-up with PCP and pain management   Diabetes mellitus: Blood sugar is controlled, continue SSI hold metformin Last Labs     Recent Labs  Lab 11/15/22 1813  GLUCAP 108*     Electrolyte abnormalities, corrected Recent Labs  Lab 11/02/22 0735 11/15/22 1752 11/16/22 0330 11/16/22 0330 11/17/22 0338 11/17/22 1502 11/18/22 0840 11/19/22 0425  K 3.5 2.5* 3.1*  --  2.4* 3.4* 3.5 3.7  MG  --   --  1.8  --  1.7  --   --  1.8  CALCIUM 8.3* 8.6* 8.6*  --  8.6* 8.5* 8.6* 8.4*  PHOS  --   --   --    < > 3.7  --   --  3.7   < > = values in this interval not displayed.  -Follow-up in outpatient setting    Hypertension: Fairly controlled on losartan  Hyperlipidemia: Continue Crestor  Oral candidiasis -Given one-time dose of fluconazole prior to discharge and can be followed in the outpatient setting with PCP  Consultants: None but discussed with her pain specialist clinic Procedures performed: As delineated as above Disposition: Home as the patient declined home health services Diet recommendation:  Discharge Diet Orders (From admission, onward)     Start     Ordered   11/19/22 0000  Diet - low sodium heart healthy        11/19/22 1224           Cardiac diet DISCHARGE MEDICATION: Allergies as of 11/19/2022       Reactions   Cefuroxime Other (See Comments)   Oral ulcers   Cephalexin Other (See Comments)   Took off first layer of skin inside of mouth   Chlorhexidine Other (See Comments)   Mouth broke out- oral ulcers   Prednisone Other (See Comments)   Possible oral ulcers   Zithromax [azithromycin Dihydrate] Other (See Comments)   ORAL ULCERS        Medication List     STOP taking these medications    oxyCODONE 15 MG immediate release tablet Commonly known as: ROXICODONE       TAKE these medications     albuterol 108 (90 Base) MCG/ACT inhaler Commonly known as: VENTOLIN HFA Inhale 2 puffs into the lungs every 6 (six) hours as needed for wheezing or shortness of breath.   ascorbic acid 500 MG tablet Commonly known as: VITAMIN C Take 500 mg by mouth daily.   aspirin EC 81 MG tablet Take 1 tablet (81 mg total) by mouth daily. Swallow whole. Start taking on: November 20, 2022   BIOTIN EXTRA STRENGTH PO Take 500 mg by mouth daily.   Centrum Silver 50+Women Tabs Take 1 tablet by mouth daily.   cholecalciferol 25 MCG (1000 UNIT) tablet Commonly known as: VITAMIN D3 Take 1,000 Units by mouth daily.   clopidogrel 75 MG tablet Commonly known as: PLAVIX Take 75 mg by mouth daily.   diclofenac 75 MG  EC tablet Commonly known as: VOLTAREN Take 75 mg by mouth 2 (two) times daily as needed for mild pain.   furosemide 20 MG tablet Commonly known as: LASIX Take 1 tablet (20 mg total) by mouth daily as needed for edema. What changed: when to take this   gabapentin 600 MG tablet Commonly known as: NEURONTIN Take 600 mg by mouth 4 (four) times daily.   losartan 50 MG tablet Commonly known as: COZAAR Take 50 mg by mouth daily.   metFORMIN 500 MG 24 hr tablet Commonly known as: GLUCOPHAGE-XR Take 500 mg by mouth daily with breakfast.   methocarbamol 500 MG tablet Commonly known as: ROBAXIN Take 500 mg by mouth daily as needed for muscle spasms.   morphine 15 MG 12 hr tablet Commonly known as: MS CONTIN Take 1 tablet (15 mg total) by mouth every 12 (twelve) hours for 7 days. What changed:  medication strength how much to take when to take this   ondansetron 4 MG tablet Commonly known as: ZOFRAN Take 1 tablet (4 mg total) by mouth every 6 (six) hours as needed for nausea.   rosuvastatin 10 MG tablet Commonly known as: CRESTOR TAKE 1 TABLET BY MOUTH EVERY DAY   TYLENOL 500 MG tablet Generic drug: acetaminophen Take 1,000 mg by mouth every 6 (six) hours as needed for  headache or mild pain.        Discharge Exam: Filed Weights   11/16/22 1200  Weight: 50.9 kg   Vitals:   11/19/22 1041 11/19/22 1314  BP:  114/63  Pulse: 88 78  Resp:  16  Temp:  98 F (36.7 C)  SpO2:  100%   Examination: Physical Exam:  Constitutional: Thin chronically ill-appearing Caucasian elderly female in no acute distress and is much more awake and alert Respiratory: Diminished to auscultation bilaterally with coarse breath sounds, no wheezing, rales, rhonchi or crackles. Normal respiratory effort and patient is not tachypenic. No accessory muscle use.,  Unlabored breathing Cardiovascular: RRR, no murmurs / rubs / gallops. S1 and S2 auscultated. No extremity edema. Abdomen: Soft, non-tender, non-distended. Bowel sounds positive.  GU: Deferred. Musculoskeletal: No clubbing / cyanosis of digits/nails. No joint deformity upper and lower extremities.   Skin: No rashes, lesions, ulcers on limited skin evaluation. No induration; Warm and dry.  Neurologic: CN 2-12 grossly intact with no focal deficits. Romberg sign and cerebellar reflexes not assessed.  Psychiatric: Normal judgment and insight. Alert and oriented x 3. Normal mood and appropriate affect.   Condition at discharge: stable  The results of significant diagnostics from this hospitalization (including imaging, microbiology, ancillary and laboratory) are listed below for reference.   Imaging Studies: CT ANGIO HEAD NECK W WO CM  Result Date: 11/16/2022 CLINICAL DATA:  TIA. EXAM: CT ANGIOGRAPHY HEAD AND NECK TECHNIQUE: Multidetector CT imaging of the head and neck was performed using the standard protocol during bolus administration of intravenous contrast. Multiplanar CT image reconstructions and MIPs were obtained to evaluate the vascular anatomy. Carotid stenosis measurements (when applicable) are obtained utilizing NASCET criteria, using the distal internal carotid diameter as the denominator. RADIATION DOSE  REDUCTION: This exam was performed according to the departmental dose-optimization program which includes automated exposure control, adjustment of the mA and/or kV according to patient size and/or use of iterative reconstruction technique. CONTRAST:  56m OMNIPAQUE IOHEXOL 350 MG/ML SOLN COMPARISON:  Same day brain MRI, noncontrast CT head 1 day prior. CTA head/neck 09/06/2022 FINDINGS: CT HEAD FINDINGS Brain: There is no acute intracranial  hemorrhage, extra-axial fluid collection, or acute infarct. The small subacute infarcts seen on the prior brain MRI are not seen on the current study Parenchymal volume is normal. The ventricles are normal in size. Gray-white differentiation is preserved. Remote infarcts in the bilateral cerebellar hemispheres are again seen. The pituitary and suprasellar region are normal there is no mass lesion there is no mass effect or midline shift. Vascular: See below. Skull: Normal. Negative for fracture or focal lesion. Sinuses/Orbits: The paranasal sinuses are clear. Bilateral lens implants are in place. The globes and orbits are otherwise unremarkable. Other: None. Review of the MIP images confirms the above findings CTA NECK FINDINGS Aortic arch: There is calcified plaque in the imaged aortic arch. The origins of the major branch vessels are patent. The subclavian arteries are patent to the level imaged. Right carotid system: The right common carotid artery is patent. There is mixed plaque at the bifurcation resulting in less than 50% stenosis. The distal internal carotid artery is patent. The external carotid artery is patent. There is no evidence of dissection or aneurysm. Left carotid system: The left common carotid artery is patent. There is mild plaque at the bifurcation resulting in less than 50% stenosis. The distal internal carotid artery is patent. The external carotid artery is patent. There is no evidence of dissection or aneurysm. Vertebral arteries: The vertebral arteries  are patent, without hemodynamically significant stenosis or occlusion there is no evidence of dissection or aneurysm. Skeleton: There is no acute osseous abnormality or suspicious osseous lesion. Sequela of prior cervical fusion and infection is unchanged. There is no visible canal hematoma. Other neck: The soft tissues of the neck are unremarkable. Upper chest: Biapical scarring and left upper lobe postsurgical changes and bronchiectasis are again noted. There is no acute abnormality. Review of the MIP images confirms the above findings CTA HEAD FINDINGS Anterior circulation: There is calcified plaque in the carotid siphons without hemodynamically significant stenosis. The bilateral M1 segments are patent. There is atherosclerotic irregularity of the distal branches without proximal high-grade stenosis or occlusion. The ACAs are patent, with atherosclerotic irregularity and narrowing of the distal branches but no proximal high-grade stenosis or occlusion. Findings are unchanged. There is no aneurysm or AVM. Posterior circulation: The bilateral V4 segments are patent. The basilar artery is patent. The major cerebellar arteries appear patent. The bilateral PCAs are patent, without proximal high-grade stenosis or occlusion. Bilateral posterior communicating arteries are identified. There is no aneurysm or AVM. Venous sinuses: As permitted by contrast timing, patent. Anatomic variants: None. Review of the MIP images confirms the above findings IMPRESSION: 1. Stable noncontrast head CT with no acute intracranial pathology. 2. Stable CTA head/neck since 09/06/2022. 3. Unchanged calcified plaque at the carotid bifurcations, right worse than left, without hemodynamically significant stenosis. 4. Stable intracranial vasculature with no proximal high-grade stenosis or occlusion. Electronically Signed   By: Valetta Mole M.D.   On: 11/16/2022 16:21   MR BRAIN WO CONTRAST  Result Date: 11/16/2022 CLINICAL DATA:  Initial  evaluation for altered mental status. EXAM: MRI HEAD WITHOUT CONTRAST TECHNIQUE: Multiplanar, multiecho pulse sequences of the brain and surrounding structures were obtained without intravenous contrast. COMPARISON:  Prior CT from 11/15/2022. FINDINGS: Brain: Examination moderately to severely degraded by motion artifact. Generalized age-related cerebral atrophy. Few scattered remote bilateral cerebellar infarcts noted. Chronic microvascular ischemic disease noted within the pons. Few scattered patchy subcentimeter foci of diffusion signal abnormality are seen involving the right frontal centrum semi ovale (series 5, images 25,  26). Associated T2/FLAIR signal without visible signal loss on ADC map. Findings likely reflect small evolving subacute small vessel type infarcts. No associated hemorrhage or mass effect. No other evidence for acute or recent infarction. Gray-white matter differentiation otherwise maintained. No other visible areas of chronic cortical infarction. No visible acute or chronic intracranial blood products. No mass lesion, midline shift or mass effect. No hydrocephalus or extra-axial fluid collection. Vascular: Major intracranial vascular flow voids are maintained. Skull and upper cervical spine: Bone marrow signal intensity grossly within normal limits. No scalp soft tissue abnormality. Sinuses/Orbits: Prior bilateral ocular lens replacement. Paranasal sinuses are clear. No significant mastoid effusion. Other: None. IMPRESSION: 1. Motion degraded exam. 2. Few scattered subcentimeter foci of diffusion signal abnormality involving the right frontal centrum semi ovale, likely reflecting small evolving subacute small vessel type infarcts. No associated hemorrhage or mass effect. 3. Underlying age-related cerebral atrophy with mild chronic small vessel ischemic disease, with a few scattered remote bilateral cerebellar infarcts. Electronically Signed   By: Jeannine Boga M.D.   On: 11/16/2022  01:04   DG Abdomen 1 View  Result Date: 11/15/2022 CLINICAL DATA:  Pre MRI evaluation EXAM: ABDOMEN - 1 VIEW COMPARISON:  09/10/2022 FINDINGS: Bilateral hip replacements are noted. Degenerative changes of lumbar spine are seen. Postsurgical changes are noted in the left upper quadrant. No radiopaque foreign body is seen. No free air is noted. IMPRESSION: No evidence of radiopaque foreign body. Electronically Signed   By: Inez Catalina M.D.   On: 11/15/2022 23:16   CT Head Wo Contrast  Result Date: 11/15/2022 CLINICAL DATA:  Altered mental status, failure to thrive EXAM: CT HEAD WITHOUT CONTRAST TECHNIQUE: Contiguous axial images were obtained from the base of the skull through the vertex without intravenous contrast. RADIATION DOSE REDUCTION: This exam was performed according to the departmental dose-optimization program which includes automated exposure control, adjustment of the mA and/or kV according to patient size and/or use of iterative reconstruction technique. COMPARISON:  10/31/2022 FINDINGS: Brain: No evidence of acute infarction, hemorrhage, hydrocephalus, extra-axial collection or mass lesion/mass effect. Mild cortical atrophy. Vascular: Mild intracranial atherosclerosis. Skull: Normal. Negative for fracture or focal lesion. Sinuses/Orbits: The visualized paranasal sinuses are essentially clear. The mastoid air cells are unopacified. Other: None. IMPRESSION: No evidence of acute intracranial abnormality. Mild cortical atrophy. Electronically Signed   By: Julian Hy M.D.   On: 11/15/2022 21:15   DG Chest Port 1 View  Result Date: 11/15/2022 CLINICAL DATA:  Weakness, failure to thrive EXAM: PORTABLE CHEST 1 VIEW COMPARISON:  10/31/2022 FINDINGS: Suture line in the left upper lobe. Associated volume loss in left hemithorax. Right lung is clear. No pneumothorax. The heart is normal in size. Surgical clips in the right chest wall/axilla. Old left rib fracture deformities. IMPRESSION: No  evidence of acute cardiopulmonary disease. Electronically Signed   By: Julian Hy M.D.   On: 11/15/2022 17:55   CT Head Wo Contrast  Result Date: 10/31/2022 CLINICAL DATA:  Mental status change, unknown cause. EXAM: CT HEAD WITHOUT CONTRAST TECHNIQUE: Contiguous axial images were obtained from the base of the skull through the vertex without intravenous contrast. RADIATION DOSE REDUCTION: This exam was performed according to the departmental dose-optimization program which includes automated exposure control, adjustment of the mA and/or kV according to patient size and/or use of iterative reconstruction technique. COMPARISON:  Head CT 09/10/2022.  MRI brain 09/06/2022. FINDINGS: Brain: No acute intracranial hemorrhage. Continued evolution of now chronic infarcts in the right-greater-than-left posterior frontal, right parietal and  right occipital lobes. 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 No hydrocephalus or extra-axial collection. No mass effect or midline shift. Vascular: No hyperdense vessel or unexpected calcification. Skull: No calvarial fracture or suspicious bone lesion. Skull base is unremarkable. Sinuses/Orbits: Unremarkable. Other: None. IMPRESSION: 1. No acute intracranial abnormality. 2. 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 Electronically Signed   By: Emmit Alexanders M.D.   On: 10/31/2022 13:52   DG Chest Port 1 View  Result Date: 10/31/2022 CLINICAL DATA:  Sepsis EXAM: PORTABLE CHEST 1 VIEW COMPARISON:  09/21/2022 x-ray FINDINGS: The left inferior costophrenic angle is clipped at the edge of the film. Film is rotated to the left as well. Stable cardiopericardial silhouette with calcified aorta. Surgical changes along the left upper lung. Surgical clips as well overlying the right axillary region. The right lung is clear.  What is seen of the left lung is clear. No edema. Overlapping cardiac leads. Osteopenia. IMPRESSION: Limited x-ray. Grossly clear lungs without pneumothorax or effusion. Surgical changes Repeat x-ray with standard technique as clinically appropriate Electronically Signed   By: Jill Side M.D.   On: 10/31/2022 12:44    Microbiology: Results for orders placed or performed during the hospital encounter of 11/15/22  Resp panel by RT-PCR (RSV, Flu A&B, Covid) Anterior Nasal Swab     Status: None   Collection Time: 11/15/22  5:52 PM   Specimen: Anterior Nasal Swab  Result Value Ref Range Status   SARS Coronavirus 2 by RT PCR NEGATIVE NEGATIVE Final    Comment: (NOTE) SARS-CoV-2 target nucleic acids are NOT DETECTED.  The SARS-CoV-2 RNA is generally detectable in upper respiratory specimens during the acute phase of infection. The lowest concentration of SARS-CoV-2 viral copies this assay can detect is 138 copies/mL. A negative result does not preclude SARS-Cov-2 infection and should not be used as the sole basis for treatment or other patient management decisions. A negative result may occur with  improper specimen collection/handling, submission of specimen other than nasopharyngeal swab, presence of viral mutation(s) within the areas targeted by this assay, and inadequate number of viral copies(<138 copies/mL). A negative result must be combined with clinical observations, patient history, and epidemiological information. The expected result is Negative.  Fact Sheet for Patients:  EntrepreneurPulse.com.au  Fact Sheet for Healthcare Providers:  IncredibleEmployment.be  This test is no t yet approved or cleared by the [place] FDA and  has been authorized for detection and/or diagnosis of SARS-CoV-2 by FDA under an Emergency Use Authorization (EUA). This EUA will remain  in effect (meaning this test can be used) for the duration of the COVID-19  declaration under Section 564(b)(1) of the Act, 21 U.S.C.section 360bbb-3(b)(1), unless the authorization is terminated  or revoked sooner.       Influenza A by PCR NEGATIVE NEGATIVE Final   Influenza B by PCR NEGATIVE NEGATIVE Final    Comment: (NOTE) The Xpert Xpress SARS-CoV-2/FLU/RSV plus assay is intended as an aid in the diagnosis of influenza from Nasopharyngeal swab specimens and should not be used as a sole basis for treatment. Nasal washings and aspirates are unacceptable for Xpert Xpress SARS-CoV-2/FLU/RSV testing.  Fact Sheet for Patients: EntrepreneurPulse.com.au  Fact Sheet for Healthcare Providers: IncredibleEmployment.be  This test is not yet approved or cleared by the [place] FDA and has been authorized for detection and/or diagnosis of SARS-CoV-2 by FDA under an Emergency Use Authorization (EUA).  This EUA will remain in effect (meaning this test can be used) for the duration of the COVID-19 declaration under Section 564(b)(1) of the Act, 21 U.S.C. section 360bbb-3(b)(1), unless the authorization is terminated or revoked.     Resp Syncytial Virus by PCR NEGATIVE NEGATIVE Final    Comment: (NOTE) Fact Sheet for Patients: EntrepreneurPulse.com.au  Fact Sheet for Healthcare Providers: IncredibleEmployment.be  This test is not yet approved or cleared by the [place] FDA and has been authorized for detection and/or diagnosis of SARS-CoV-2 by FDA under an Emergency Use Authorization (EUA). This EUA will remain in effect (meaning this test can be used) for the duration of the COVID-19 declaration under Section 564(b)(1) of the Act, 21 U.S.C. section 360bbb-3(b)(1), unless the authorization is terminated or revoked.  Performed at [facility], [place] [address] [place], [place] [address]   Culture, blood (routine x 2)     Status: None (Preliminary result)    Collection Time: 11/15/22  5:52 PM   Specimen: BLOOD  Result Value Ref Range Status   Specimen Description   Final    BLOOD LEFT ANTECUBITAL Performed at [place] [address] [place], [place] [address]    Special Requests   Final    BOTTLES DRAWN AEROBIC AND ANAEROBIC Blood Culture adequate volume Performed at [place] [address] [place], [place] [address]    Culture   Final    NO GROWTH 4 DAYS Performed at [facility], [place] [address], [place], [place] [address]    Report Status PENDING  Incomplete  Culture, blood (routine x 2)     Status: None (Preliminary result)   Collection Time: 11/15/22  6:02 PM   Specimen: BLOOD  Result Value Ref Range Status   Specimen Description   Final    BLOOD LEFT ANTECUBITAL Performed at [place] [address], [place], [place] [address]    Special Requests   Final    BOTTLES DRAWN AEROBIC AND ANAEROBIC Blood Culture adequate volume Performed at [place] [address] [place], [place] [place] [id number]    Culture   Final    NO GROWTH 4 DAYS Performed at [facility], [place] [address], [place], [place] [address]    Report Status PENDING  Incomplete  Urine Culture (for pregnant, neutropenic or urologic patients or patients with an indwelling urinary catheter)     Status: Abnormal   Collection Time: 11/16/22  4:15 AM   Specimen: Urine, Clean Catch  Result Value Ref Range Status   Specimen Description   Final    URINE, CLEAN CATCH Performed at [facility], [place] [address] [place], [place] [address]    Special Requests   Final    NONE Performed at [facility], [place] [address], [place], [place] [address]    Culture >=100,000 COLONIES/mL ESCHERICHIA COLI (A)  Final   Report Status 11/19/2022 FINAL  Final   Organism ID, Bacteria ESCHERICHIA COLI (A)  Final      Susceptibility   Escherichia coli -  MIC*    AMPICILLIN >=32 RESISTANT Resistant     CEFAZOLIN <=4 SENSITIVE Sensitive     CEFEPIME <=0.12 SENSITIVE Sensitive     CEFTRIAXONE <=0.25 SENSITIVE Sensitive     CIPROFLOXACIN <=0.25 SENSITIVE Sensitive     GENTAMICIN <=1 SENSITIVE Sensitive     IMIPENEM <=0.25 SENSITIVE Sensitive     NITROFURANTOIN <=16 SENSITIVE Sensitive  TRIMETH/SULFA <=20 SENSITIVE Sensitive     AMPICILLIN/SULBACTAM >=32 RESISTANT Resistant     PIP/TAZO <=4 SENSITIVE Sensitive     * >=100,000 COLONIES/mL ESCHERICHIA COLI  C Difficile Quick Screen w PCR reflex     Status: None   Collection Time: 11/17/22 10:13 AM   Specimen: STOOL  Result Value Ref Range Status   C Diff antigen NEGATIVE NEGATIVE Final   C Diff toxin NEGATIVE NEGATIVE Final   C Diff interpretation No C. difficile detected.  Final    Comment: Performed at [facility], [place] [address] [place], [place] [address]  Gastrointestinal Panel by PCR , Stool     Status: Abnormal   Collection Time: 11/17/22 10:13 AM   Specimen: STOOL  Result Value Ref Range Status   Campylobacter species NOT DETECTED NOT DETECTED Final   Plesimonas shigelloides NOT DETECTED NOT DETECTED Final   Salmonella species NOT DETECTED NOT DETECTED Final   Yersinia enterocolitica NOT DETECTED NOT DETECTED Final   Vibrio species NOT DETECTED NOT DETECTED Final   Vibrio cholerae NOT DETECTED NOT DETECTED Final   Enteroaggregative E coli (EAEC) NOT DETECTED NOT DETECTED Final   Enteropathogenic E coli (EPEC) NOT DETECTED NOT DETECTED Final   Enterotoxigenic E coli (ETEC) NOT DETECTED NOT DETECTED Final   Shiga like toxin producing E coli (STEC) NOT DETECTED NOT DETECTED Final   Shigella/Enteroinvasive E coli (EIEC) NOT DETECTED NOT DETECTED Final   Cryptosporidium NOT DETECTED NOT DETECTED Final   Cyclospora cayetanensis NOT DETECTED NOT DETECTED Final   Entamoeba histolytica NOT DETECTED NOT DETECTED Final   Giardia lamblia NOT DETECTED NOT DETECTED  Final   Adenovirus F40/41 NOT DETECTED NOT DETECTED Final   Astrovirus NOT DETECTED NOT DETECTED Final   Norovirus GI/GII DETECTED (A) NOT DETECTED Final    Comment: RESULT CALLED TO, READ BACK BY AND VERIFIED WITH: DREW ALQUIRIA AT 2117 ON 11/17/22 BY SS    Rotavirus A NOT DETECTED NOT DETECTED Final   Sapovirus (I, II, IV, and V) NOT DETECTED NOT DETECTED Final    Comment: Performed at [facility], [place] [place], [place] [address]    Labs: CBC: Recent Labs  Lab 11/15/22 1752 11/16/22 0330 11/19/22 0425  WBC 8.1 8.2 6.3  NEUTROABS 5.9  --  3.3  HGB 13.3 13.3 12.2  HCT 38.2 39.3 36.7  MCV 89.3 91.6 93.1  PLT 262 263 [id number]   Basic Metabolic Panel: Recent Labs  Lab 11/16/22 0330 11/17/22 0338 11/17/22 1502 11/18/22 0840 11/19/22 0425  NA 136 134* 132* 134* 137  K 3.1* 2.4* 3.4* 3.5 3.7  CL 101 101 103 104 109  CO2 23 22 21* 23 24  GLUCOSE 92 87 131* 99 95  BUN '10 9 10 8 9  '$ CREATININE 0.57 0.47 0.56 0.52 0.64  CALCIUM 8.6* 8.6* 8.5* 8.6* 8.4*  MG 1.8 1.7  --   --  1.8  PHOS  --  3.7  --   --  3.7   Liver Function Tests: Recent Labs  Lab 11/15/22 1752  AST 20  ALT 13  ALKPHOS 64  BILITOT 1.0  PROT 7.0  ALBUMIN 3.3*   CBG: Recent Labs  Lab 11/15/22 1813  GLUCAP 108*   Discharge time spent: greater than 30 minutes.  Signed: Raiford Noble, DO [facility] 11/19/2022

## 2022-11-19 NOTE — Progress Notes (Signed)
Patient reports daughter will be here after 2 pm to assist with transportation.

## 2022-11-19 NOTE — Progress Notes (Signed)
AVS instructions were reviewed with daughter, Nira Conn and husband Gwyndolyn Saxon at bedside.   Everyone verbalizes understanding, all questions were answered.    All personal belongings were returned.    Patient's daughter will be transporting patient home.

## 2022-11-19 NOTE — Progress Notes (Signed)
Patient requesting to leave AMA, after conversation with MD.   Patient informed of risks associated, and is agreeable to waiting for IV medication to be administered prior to leaving.   Call bell within reach. Patient A/O X4, denies any needs.

## 2022-11-19 NOTE — Evaluation (Addendum)
Speech Language Pathology Evaluation Patient Details Name: [name] MRN: [id number] DOB: 02-Apr-1946 Today's Date: 11/19/2022 Time: [id number] SLP Time Calculation (min) (ACUTE ONLY): 29 min  Problem List:  Patient Active Problem List   Diagnosis Date Noted   Acute gastroenteropathy due to Norovirus 11/18/2022   Acute encephalopathy 11/16/2022   Acute ischemic stroke ([place]) 09/06/2022   Protein-calorie malnutrition, severe 0000000   Acute metabolic encephalopathy [id number]   Prediabetes 08/08/2022   Falls, initial encounter 08/08/2022   C. 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   Back pain 02/21/2022   Cardiovascular symptoms 02/21/2022   Contusion of abdominal wall 02/21/2022   Cough 02/21/2022   Decreased estrogen level 02/21/2022   Diarrhea 02/21/2022   Disorder of breast 02/21/2022   Encounter for screening mammogram for malignant neoplasm of breast 02/21/2022   Former smoker 02/21/2022   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[place]) 02/21/2022   Hardening of the aorta (main artery of the heart) ([place]) 02/21/2022   Iron deficiency anemia due to chronic blood loss 02/21/2022   Kyphosis 02/21/2022   Malignant neoplasm of female breast ([place]) 02/21/2022   Neck pain 02/21/2022   Orthopnea 02/21/2022   Other long term (current) drug therapy 02/21/2022   Pedal edema 02/21/2022   Proteinuria 02/21/2022   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   Smoker 02/21/2022   Standard chest x-ray abnormal 02/21/2022   Temperature elevated 02/21/2022   DM2 (diabetes mellitus, type 2) ([place]) 02/21/2022   Varicose veins of lower extremity with inflammation 02/21/2022   Acute postoperative pain 11/19/2021   Chronic prescription opiate use 11/19/2021    S/P flap graft 11/19/2021   Raynaud's disease 05/28/2021   Acquired trigger finger of right ring finger 05/27/2021   Pain in right hand 05/27/2021   Closed fracture of shaft of right femur ([place]) 06/13/2019   Cervical radiculopathy 03/16/2019   Gastrostomy tube dysfunction ([place]) 06/25/2018   Impingement syndrome of left shoulder region 05/24/2018   Impingement syndrome of right shoulder region 05/24/2018   Osteoarthritis 03/01/2018   Fracture of multiple ribs of left side 11/29/2017   History of breast cancer 11/29/2017   Pressure injury of sacral region, stage 1 11/29/2017   Pathological fracture of pelvis due to age-related osteoporosis 11/22/2017   Syncope and collapse 11/22/2017   Empyema with fistula ([place]) 11/11/2017   Chronic pain 09/20/2017   CAD (coronary artery disease) 01/26/2017   History of atrial fibrillation 01/26/2017   Tobacco consumption 02/12/2016   S/P right TH revision 10/22/2015   S/P revision of total hip 10/22/2015   Cholecystitis    Esophageal fistula    Abscess    Discitis    HCAP (healthcare-associated pneumonia)    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[place]) 04/28/2015   Abscess of neck    Esophageal perforation    Loss of weight    Esophageal dysphagia    AKI (acute kidney injury) ([place])    Pneumonia    Respiratory failure ([place])    Hypokalemia 08/26/2014   Hyponatremia 08/26/2014   COPD with acute exacerbation ([place])    Tobacco dependence    Hyperlipidemia    Essential hypertension    CAD S/P percutaneous coronary angioplasty    Past Medical History:  Past Medical History:  Diagnosis Date   Asthma    Breast  cancer [facility])    right breast   COPD (chronic obstructive pulmonary disease) (H[facility])    Coronary artery disease    Diverticulum of esophagus    Elevated LFTs    Emphysema lung ([facility])    ETOH abuse    H/O atrial fibrillation without current medication    only one time when she had sepsis   Hyperlipidemia    Hypertension    hx of but not on  any medications   Myocardial infarction ([place]) 2000   OA (osteoarthritis) of knee    Osteoarthritis    Tobacco abuse    Past Surgical History:  Past Surgical History:  Procedure Laterality Date   ANTERIOR HIP REVISION Right 10/22/2015   Procedure: RIGHT  HIP REVISION;  Surgeon: Paralee Cancel, MD;  Location: [facility];  Service: Orthopedics;  Laterality: Right;   APPLICATION OF WOUND VAC N/A 06/20/2015   Procedure: APPLICATION OF INCISIONAL WOUND VAC;  Surgeon: Melina Schools, MD;  Location: [place];  Service: Orthopedics;  Laterality: N/A;   BREAST SURGERY  1991   right mastectomy   CARDIAC CATHETERIZATION  04/05/2009   EF 60%   CARDIOVASCULAR STRESS TEST  01/31/2005   EF 58%   CESAREAN SECTION  '78, '80, '81   x 3   CORONARY ANGIOPLASTY  08/1998   x2 OF A BIFURCATION OM-1, OM-2 LESION   CORONARY ANGIOPLASTY WITH STENT PLACEMENT  01/1999   MID FIRST OBTUSE MARGINAL VESSEL   CORONARY ANGIOPLASTY WITH STENT PLACEMENT  07/1999   STENTING AT THE CRUX OF THE RIGHT CORONARY ARTERY WITH A 3.8MM X 18MM TETRA STENT   DIRECT LARYNGOSCOPY N/A 05/03/2015   Procedure: DIRECT LARYNGOSCOPY;  Surgeon: Jodi Marble, MD;  Location: [place];  Service: ENT;  Laterality: N/A;   EYE SURGERY  05/18/2014,06/01/2014   BILATERAL CATARACT S WITH LENS IMPLANTS   GASTROSTOMY N/A 05/04/2015   Procedure: OPEN GASTROSTOMY WITH TUBE PLACEMENT;  Surgeon: Donnie Mesa, MD;  Location: [place];  Service: General;  Laterality: N/A;   GASTROSTOMY N/A 11/13/2016   Procedure: INSERTION OF GASTROSTOMY TUBE;  Surgeon: Coralie Keens, MD;  Location: [place];  Service: General;  Laterality: N/A;   HARDWARE REMOVAL N/A 05/03/2015   Procedure: HARDWARE REMOVAL;  Surgeon: Melina Schools, MD;  Location: [place];  Service: Orthopedics;  Laterality: N/A;   HIP CLOSED REDUCTION Right 04/26/2015   Procedure: CLOSED REDUCTION HIP;  Surgeon: Melina Schools, MD;  Location[facility];  Service: Orthopedics;  Laterality: Right;   HYSTEROSCOPY     D & C    INCISION AND DRAINAGE ABSCESS N/A 05/03/2015   Procedure: INCISION AND DRAINAGE CERVICAL  ABSCESS AND REMOVAL OF HARDWARE;  Surgeon: Melina Schools, MD;  Location: [place];  Service: Orthopedics;  Laterality: N/A;   IR CM INJ ANY COLONIC TUBE W/FLUORO  10/14/2017   IR CM INJ ANY COLONIC TUBE W/FLUORO  10/23/2017   IR CM INJ ANY COLONIC TUBE W/FLUORO  10/28/2017   IR REPLC GASTRO/COLONIC TUBE PERCUT W/FLUORO  09/22/2017   JOINT REPLACEMENT  08/2011   bilateral hip   JOINT REPLACEMENT  01/2012   right hip   MASTECTOMY     neck fusion  2011   ORIF FEMUR FRACTURE Right 06/14/2019   Procedure: ORIF PERI PROSTHETIC FEMUR FRACTURE;  Surgeon: Paralee Cancel, MD;  Location: [facility];  Service: Orthopedics;  Laterality: Right;   PELVIC LAPAROSCOPY  2002   RSO-     RADICAL NECK DISSECTION N/A 11/08/2016   Procedure: INCISION AND DRAINAGE  OF NECK ABSCESS;  Surgeon: Jerrell [place], MD;  Location: [place];  Service: ENT;  Laterality: N/A;   RADIOLOGY WITH ANESTHESIA Right 06/28/2015   Procedure: MRI OF CERVICAL SPINE  AND RIGHT HIP  WITH AND WITHOUT CONTRAST    (RADIOLOGY WITH ANESTHESIA);  Surgeon: Medication Radiologist, MD;  Location: [facility];  Service: Radiology;  Laterality: Right;   REMOVAL OF GASTROSTOMY TUBE N/A 11/14/2016   Procedure: REMOVAL OF GASTROSTOMY TUBE W/ REPLACEMENT OF GASTROSTOMY TUBE;  Surgeon: Coralie Keens, MD;  Location: [place];  Service: General;  Laterality: N/A;   RIGID ESOPHAGOSCOPY N/A 05/03/2015   Procedure: RIGID ESOPHAGOSCOPY;  Surgeon: Jodi Marble, MD;  Location: [facility];  Service: ENT;  Laterality: N/A;   TONSILLECTOMY AND ADENOIDECTOMY     TOTAL HIP ARTHROPLASTY  08/2010   bilat   VULVECTOMY  1981   partial   HPI:  Per MD note " CAD, HTN, HLD, stroke, chronic pain on chronic opiate who was admitted in Nov for AMS + AKI, felt to be due to polypharmacy including opiates and in Dec for multifocal acute strokes, felt by neurology to be due to CVD and hypotension in setting of  polypharmacy including opiates and again recent admission 2/16 - 2/18 for AMS / lethargy and confusion which was, once again, felt to be due to ongoing polypharmacy.  She was ultimately restarted on a reduced dose of pain meds for her chronic pain at time of discharge."  Speech evaluation ordered.   Assessment / Plan / Recommendation Clinical Impression  Patient with intact speech/language and no dysarthria.  SLUMS cog screen administered with pt scoring 20/30 - indicative of cognitive deficits per authors.  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  She demonstrates impaired recall of words - needed cues for 4/5 words - but able to state from category cue or mulitple choice.   SLP advised pt have her spouse check to assure she is taking meds as needed, appointments, etc. She did have awareness to state "I didn't know that I had prior strokes".  No SLP follow up as she appears to have support needed at home.   Pt appears with white coating on her tongue - concerning for oral candidiasis - pt aware. Advised she brush her tongue - and determine if clears - if not inform MD.  RN reports she was aware and informed MD yesterday.    SLP Assessment  SLP Recommendation/Assessment: Patient does not need any further Speech [place] [facility] SLP Visit Diagnosis: Cognitive communication deficit (R41.841)    Recommendations for follow up therapy are one component of a multi-disciplinary discharge planning process, led by the attending physician.  Recommendations may be updated based on patient status, additional functional criteria and insurance authorization.    Follow Up Recommendations  No SLP follow up    Assistance Recommended at Discharge  None  Functional Status Assessment Patient has had a recent decline in their functional status and demonstrates the ability to make significant improvements in function in a reasonable and  predictable amount of time.  Frequency and Duration           SLP Evaluation Cognition  Overall Cognitive Status: No family/caregiver present to determine baseline cognitive functioning Orientation Level: Oriented to person;Oriented to place;Disoriented to situation;Disoriented to time (oriented to month, year but not date) Year: 2024 Month: March Day of Week: Correct Attention: Focused;Sustained Focused Attention: Appears intact Sustained Attention: Impaired Sustained Attention Impairment:  Verbal complex Memory: Impaired Memory Impairment: Retrieval deficit Awareness:  (pt appears with limited insight but reports willing to have spouse help assure medications taken appropriately) Problem Solving: Appears intact (for testing items) Behaviors: Other (comment) (verbose)       Comprehension  Auditory Comprehension Overall Auditory Comprehension: Appears within functional limits for tasks assessed Yes/No Questions: Not tested Commands: Within Functional Limits Conversation: Complex Visual Recognition/Discrimination Discrimination: Within Function Limits Reading Comprehension Reading Status: Not tested    Expression Expression Primary Mode of Expression: Verbal Verbal Expression Overall Verbal Expression: Appears within functional limits for tasks assessed Initiation: No impairment Level of Generative/Spontaneous Verbalization: Conversation Repetition: No impairment Naming: Not tested Pragmatics: No impairment Written Expression Dominant Hand: Right   Oral / Motor  Oral Motor/Sensory Function Overall Oral Motor/Sensory Function: Within functional limits Motor Speech Overall Motor Speech: Appears within functional limits for tasks assessed Respiration: Within functional limits Phonation: Hoarse (pt reports baseline) Resonance: Within functional limits Articulation: Within functional limitis Intelligibility: Intelligible Motor Planning: Witnin functional  limits Interfering Components: Premorbid status           Rachel Lime, MS [facility] SLP Acute Rehab Services Office [phone]  [name] 11/19/2022, 1:06 PM

## 2022-11-19 NOTE — Progress Notes (Signed)
Physical Therapy Treatment Patient Details Name: [name] MRN: [id number] DOB: [birth year] Today's Date: 11/19/2022   History of Present Illness 77 yo female admitted with acute encephalopathy/AMS, FTT, weakness. Ultimately sent in to ER from home for lethargy and confusion. Medical hx of CAD, COPD, breast CA s/p mastectomy, MI, Bil THR, PE, DM, Chronic pain syndrome with back pain, and ETOH abuse.    PT Comments    Pt agreeable to participate. She was a bit ornery on today-generally upset about all things and eager to be released to go home. She continues to fatigue fairly easily and she is generally weak and unsteady. Assisted pt back to bed at her request.     Recommendations for follow up therapy are one component of a multi-disciplinary discharge planning process, led by the attending physician.  Recommendations may be updated based on patient status, additional functional criteria and insurance authorization.  Follow Up Recommendations  Home health PT     Assistance Recommended at Discharge Frequent or constant Supervision/Assistance  Patient can return home with the following A little help with walking and/or transfers;A little help with bathing/dressing/bathroom;Assistance with cooking/housework;Assist for transportation;Help with stairs or ramp for entrance   Equipment Recommendations  None recommended by PT    Recommendations for Other Services       Precautions / Restrictions Precautions Precautions: Fall Precaution Comments: kyphotic Restrictions Weight Bearing Restrictions: No     Mobility  Bed Mobility Overal bed mobility: Modified Independent                  Transfers Overall transfer level: Needs assistance   Transfers: Sit to/from Stand, Bed to chair/wheelchair/BSC Sit to Stand: Min guard Stand pivot transfers: Min guard         General transfer comment: Pt relied on arms of bsc and or bedrail for UE support. Increased time. Cues for  safety.    Ambulation/Gait Ambulation/Gait assistance: Min guard Gait Distance (Feet): 15 Feet   Gait Pattern/deviations: Step-through pattern, Decreased step length - right, Decreased step length - left, Shuffle, Trunk flexed       General Gait Details: pt walked around bed and back-furniture cruising-stated "no, i don't use a walker". dyspnea 2/4. O2 >90% on RA   Stairs             Wheelchair Mobility    Modified Rankin (Stroke Patients Only)       Balance Overall balance assessment: Needs assistance         Standing balance support: During functional activity Standing balance-Leahy Scale: Fair                              Cognition Arousal/Alertness: Awake/alert Behavior During Therapy: WFL for tasks assessed/performed Overall Cognitive Status: No family/caregiver present to determine baseline cognitive functioning Area of Impairment: Safety/judgement                     Memory: Decreased short-term memory   Safety/Judgement: Decreased awareness of safety              Exercises      General Comments        Pertinent Vitals/Pain Pain Assessment Pain Assessment: Faces Faces Pain Scale: Hurts even more Pain Location: back Pain Descriptors / Indicators: Aching, Grimacing Pain Intervention(s): Limited activity within patient's tolerance, Monitored during session, Repositioned, Patient requesting pain meds-RN notified    Home Living  Prior Function            PT Goals (current goals can now be found in the care plan section) Progress towards PT goals: Progressing toward goals    Frequency    Min 3X/week      PT Plan Current plan remains appropriate    Co-evaluation              AM-PAC PT "6 Clicks" Mobility   Outcome Measure  Help needed turning from your back to your side while in a flat bed without using bedrails?: None Help needed moving from lying on your back  to sitting on the side of a flat bed without using bedrails?: A Little Help needed moving to and from a bed to a chair (including a wheelchair)?: A Little Help needed standing up from a chair using your arms (e.g., wheelchair or bedside chair)?: A Little Help needed to walk in hospital room?: A Little Help needed climbing 3-5 steps with a railing? : A Lot 6 Click Score: 18    End of Session   Activity Tolerance: Patient limited by fatigue Patient left: in bed;with call bell/phone within reach;with bed alarm set   PT Visit Diagnosis: Muscle weakness (generalized) (M62.81);Difficulty in walking, not elsewhere classified (R26.2)     Time: [id number] PT Time Calculation (min) (ACUTE ONLY): 13 min  Charges:  $Gait Training: 8-22 mins                        Doreatha Massed, PT Acute Rehabilitation  Office: [phone]

## 2022-11-20 LAB — CULTURE, BLOOD (ROUTINE X 2)
Culture: NO GROWTH
Culture: NO GROWTH
Special Requests: ADEQUATE
Special Requests: ADEQUATE

## 2022-11-26 DIAGNOSIS — E46 Unspecified protein-calorie malnutrition: Secondary | ICD-10-CM | POA: Diagnosis not present

## 2022-11-26 DIAGNOSIS — E119 Type 2 diabetes mellitus without complications: Secondary | ICD-10-CM | POA: Diagnosis not present

## 2022-11-26 DIAGNOSIS — Z09 Encounter for follow-up examination after completed treatment for conditions other than malignant neoplasm: Secondary | ICD-10-CM | POA: Diagnosis not present

## 2022-11-26 DIAGNOSIS — M7062 Trochanteric bursitis, left hip: Secondary | ICD-10-CM | POA: Diagnosis not present

## 2022-11-26 DIAGNOSIS — J449 Chronic obstructive pulmonary disease, unspecified: Secondary | ICD-10-CM | POA: Diagnosis not present

## 2022-11-26 DIAGNOSIS — I700 Atherosclerosis of aorta: Secondary | ICD-10-CM | POA: Diagnosis not present

## 2022-12-01 DIAGNOSIS — G8929 Other chronic pain: Secondary | ICD-10-CM | POA: Diagnosis not present

## 2022-12-06 ENCOUNTER — Inpatient Hospital Stay (HOSPITAL_COMMUNITY)
Admission: EM | Admit: 2022-12-06 | Discharge: 2022-12-10 | DRG: 071 | Disposition: A | Discharge status: Skilled Nursing Facility | Payer: Medicare Other | Attending: Internal Medicine | Admitting: Internal Medicine

## 2022-12-06 ENCOUNTER — Emergency Department (HOSPITAL_COMMUNITY): Payer: Medicare Other

## 2022-12-06 ENCOUNTER — Other Ambulatory Visit: Payer: Self-pay

## 2022-12-06 DIAGNOSIS — Z1152 Encounter for screening for COVID-19: Secondary | ICD-10-CM | POA: Diagnosis not present

## 2022-12-06 DIAGNOSIS — I69828 Other speech and language deficits following other cerebrovascular disease: Secondary | ICD-10-CM | POA: Diagnosis not present

## 2022-12-06 DIAGNOSIS — G8929 Other chronic pain: Secondary | ICD-10-CM | POA: Diagnosis present

## 2022-12-06 DIAGNOSIS — I2510 Atherosclerotic heart disease of native coronary artery without angina pectoris: Secondary | ICD-10-CM | POA: Diagnosis present

## 2022-12-06 DIAGNOSIS — R0602 Shortness of breath: Secondary | ICD-10-CM | POA: Diagnosis not present

## 2022-12-06 DIAGNOSIS — E876 Hypokalemia: Secondary | ICD-10-CM | POA: Diagnosis present

## 2022-12-06 DIAGNOSIS — J439 Emphysema, unspecified: Secondary | ICD-10-CM | POA: Diagnosis not present

## 2022-12-06 DIAGNOSIS — Z8249 Family history of ischemic heart disease and other diseases of the circulatory system: Secondary | ICD-10-CM | POA: Diagnosis not present

## 2022-12-06 DIAGNOSIS — Z96643 Presence of artificial hip joint, bilateral: Secondary | ICD-10-CM | POA: Diagnosis present

## 2022-12-06 DIAGNOSIS — Z833 Family history of diabetes mellitus: Secondary | ICD-10-CM | POA: Diagnosis not present

## 2022-12-06 DIAGNOSIS — Z9011 Acquired absence of right breast and nipple: Secondary | ICD-10-CM | POA: Diagnosis not present

## 2022-12-06 DIAGNOSIS — I69398 Other sequelae of cerebral infarction: Secondary | ICD-10-CM | POA: Diagnosis not present

## 2022-12-06 DIAGNOSIS — M1710 Unilateral primary osteoarthritis, unspecified knee: Secondary | ICD-10-CM | POA: Diagnosis not present

## 2022-12-06 DIAGNOSIS — I252 Old myocardial infarction: Secondary | ICD-10-CM

## 2022-12-06 DIAGNOSIS — G894 Chronic pain syndrome: Secondary | ICD-10-CM | POA: Diagnosis present

## 2022-12-06 DIAGNOSIS — Z781 Physical restraint status: Secondary | ICD-10-CM

## 2022-12-06 DIAGNOSIS — F05 Delirium due to known physiological condition: Secondary | ICD-10-CM | POA: Diagnosis present

## 2022-12-06 DIAGNOSIS — M1990 Unspecified osteoarthritis, unspecified site: Secondary | ICD-10-CM | POA: Diagnosis present

## 2022-12-06 DIAGNOSIS — Z888 Allergy status to other drugs, medicaments and biological substances status: Secondary | ICD-10-CM

## 2022-12-06 DIAGNOSIS — E861 Hypovolemia: Secondary | ICD-10-CM | POA: Diagnosis present

## 2022-12-06 DIAGNOSIS — Z7401 Bed confinement status: Secondary | ICD-10-CM | POA: Diagnosis not present

## 2022-12-06 DIAGNOSIS — R5383 Other fatigue: Secondary | ICD-10-CM | POA: Diagnosis present

## 2022-12-06 DIAGNOSIS — Z8673 Personal history of transient ischemic attack (TIA), and cerebral infarction without residual deficits: Secondary | ICD-10-CM | POA: Diagnosis not present

## 2022-12-06 DIAGNOSIS — J4489 Other specified chronic obstructive pulmonary disease: Secondary | ICD-10-CM | POA: Diagnosis present

## 2022-12-06 DIAGNOSIS — R6889 Other general symptoms and signs: Secondary | ICD-10-CM | POA: Diagnosis not present

## 2022-12-06 DIAGNOSIS — F1120 Opioid dependence, uncomplicated: Secondary | ICD-10-CM | POA: Diagnosis present

## 2022-12-06 DIAGNOSIS — Z955 Presence of coronary angioplasty implant and graft: Secondary | ICD-10-CM | POA: Diagnosis not present

## 2022-12-06 DIAGNOSIS — J449 Chronic obstructive pulmonary disease, unspecified: Secondary | ICD-10-CM | POA: Diagnosis not present

## 2022-12-06 DIAGNOSIS — G9341 Metabolic encephalopathy: Principal | ICD-10-CM | POA: Diagnosis present

## 2022-12-06 DIAGNOSIS — F03911 Unspecified dementia, unspecified severity, with agitation: Secondary | ICD-10-CM | POA: Diagnosis not present

## 2022-12-06 DIAGNOSIS — I4821 Permanent atrial fibrillation: Secondary | ICD-10-CM | POA: Diagnosis not present

## 2022-12-06 DIAGNOSIS — Z853 Personal history of malignant neoplasm of breast: Secondary | ICD-10-CM

## 2022-12-06 DIAGNOSIS — E871 Hypo-osmolality and hyponatremia: Secondary | ICD-10-CM | POA: Diagnosis present

## 2022-12-06 DIAGNOSIS — Z823 Family history of stroke: Secondary | ICD-10-CM

## 2022-12-06 DIAGNOSIS — J45909 Unspecified asthma, uncomplicated: Secondary | ICD-10-CM | POA: Diagnosis not present

## 2022-12-06 DIAGNOSIS — B3731 Acute candidiasis of vulva and vagina: Secondary | ICD-10-CM

## 2022-12-06 DIAGNOSIS — Z981 Arthrodesis status: Secondary | ICD-10-CM

## 2022-12-06 DIAGNOSIS — Z79899 Other long term (current) drug therapy: Secondary | ICD-10-CM

## 2022-12-06 DIAGNOSIS — R4182 Altered mental status, unspecified: Principal | ICD-10-CM

## 2022-12-06 DIAGNOSIS — E785 Hyperlipidemia, unspecified: Secondary | ICD-10-CM | POA: Diagnosis not present

## 2022-12-06 DIAGNOSIS — F17210 Nicotine dependence, cigarettes, uncomplicated: Secondary | ICD-10-CM | POA: Diagnosis present

## 2022-12-06 DIAGNOSIS — T426X5A Adverse effect of other antiepileptic and sedative-hypnotic drugs, initial encounter: Secondary | ICD-10-CM | POA: Diagnosis present

## 2022-12-06 DIAGNOSIS — I10 Essential (primary) hypertension: Secondary | ICD-10-CM | POA: Diagnosis not present

## 2022-12-06 DIAGNOSIS — A0472 Enterocolitis due to Clostridium difficile, not specified as recurrent: Secondary | ICD-10-CM | POA: Diagnosis present

## 2022-12-06 DIAGNOSIS — R2689 Other abnormalities of gait and mobility: Secondary | ICD-10-CM | POA: Diagnosis not present

## 2022-12-06 DIAGNOSIS — M6281 Muscle weakness (generalized): Secondary | ICD-10-CM | POA: Diagnosis not present

## 2022-12-06 DIAGNOSIS — I499 Cardiac arrhythmia, unspecified: Secondary | ICD-10-CM | POA: Diagnosis not present

## 2022-12-06 DIAGNOSIS — Z7902 Long term (current) use of antithrombotics/antiplatelets: Secondary | ICD-10-CM

## 2022-12-06 DIAGNOSIS — R7303 Prediabetes: Secondary | ICD-10-CM

## 2022-12-06 DIAGNOSIS — E119 Type 2 diabetes mellitus without complications: Secondary | ICD-10-CM | POA: Diagnosis not present

## 2022-12-06 DIAGNOSIS — Z881 Allergy status to other antibiotic agents status: Secondary | ICD-10-CM

## 2022-12-06 DIAGNOSIS — Z743 Need for continuous supervision: Secondary | ICD-10-CM | POA: Diagnosis not present

## 2022-12-06 DIAGNOSIS — R404 Transient alteration of awareness: Secondary | ICD-10-CM | POA: Diagnosis not present

## 2022-12-06 DIAGNOSIS — Z7984 Long term (current) use of oral hypoglycemic drugs: Secondary | ICD-10-CM

## 2022-12-06 DIAGNOSIS — M549 Dorsalgia, unspecified: Secondary | ICD-10-CM | POA: Diagnosis not present

## 2022-12-06 DIAGNOSIS — T50995A Adverse effect of other drugs, medicaments and biological substances, initial encounter: Secondary | ICD-10-CM | POA: Diagnosis present

## 2022-12-06 DIAGNOSIS — Z7982 Long term (current) use of aspirin: Secondary | ICD-10-CM

## 2022-12-06 LAB — URINALYSIS, ROUTINE W REFLEX MICROSCOPIC
Bacteria, UA: NONE SEEN
Bilirubin Urine: NEGATIVE
Glucose, UA: NEGATIVE mg/dL
Hgb urine dipstick: NEGATIVE
Ketones, ur: 5 mg/dL — AB
Leukocytes,Ua: NEGATIVE
Nitrite: NEGATIVE
Protein, ur: 30 mg/dL — AB
Specific Gravity, Urine: 1.009 (ref 1.005–1.030)
pH: 7 (ref 5.0–8.0)

## 2022-12-06 LAB — CBC WITH DIFFERENTIAL/PLATELET
Abs Immature Granulocytes: 0.02 10*3/uL (ref 0.00–0.07)
Basophils Absolute: 0 10*3/uL (ref 0.0–0.1)
Basophils Relative: 0 %
Eosinophils Absolute: 0 10*3/uL (ref 0.0–0.5)
Eosinophils Relative: 1 %
HCT: 33.8 % — ABNORMAL LOW (ref 36.0–46.0)
Hemoglobin: 11.5 g/dL — ABNORMAL LOW (ref 12.0–15.0)
Immature Granulocytes: 0 %
Lymphocytes Relative: 19 %
Lymphs Abs: 1.4 10*3/uL (ref 0.7–4.0)
MCH: 30.7 pg (ref 26.0–34.0)
MCHC: 34 g/dL (ref 30.0–36.0)
MCV: 90.4 fL (ref 80.0–100.0)
Monocytes Absolute: 0.6 10*3/uL (ref 0.1–1.0)
Monocytes Relative: 8 %
Neutro Abs: 5.2 10*3/uL (ref 1.7–7.7)
Neutrophils Relative %: 72 %
Platelets: 283 10*3/uL (ref 150–400)
RBC: 3.74 MIL/uL — ABNORMAL LOW (ref 3.87–5.11)
RDW: 14.7 % (ref 11.5–15.5)
WBC: 7.3 10*3/uL (ref 4.0–10.5)
nRBC: 0 % (ref 0.0–0.2)

## 2022-12-06 LAB — COMPREHENSIVE METABOLIC PANEL
ALT: 13 U/L (ref 0–44)
AST: 19 U/L (ref 15–41)
Albumin: 3.1 g/dL — ABNORMAL LOW (ref 3.5–5.0)
Alkaline Phosphatase: 57 U/L (ref 38–126)
Anion gap: 9 (ref 5–15)
BUN: 12 mg/dL (ref 8–23)
CO2: 24 mmol/L (ref 22–32)
Calcium: 8.5 mg/dL — ABNORMAL LOW (ref 8.9–10.3)
Chloride: 99 mmol/L (ref 98–111)
Creatinine, Ser: 0.51 mg/dL (ref 0.44–1.00)
GFR, Estimated: >60 mL/min (ref >60)
Glucose, Bld: 104 mg/dL — ABNORMAL HIGH (ref 70–99)
Potassium: 2.6 mmol/L — CL (ref 3.5–5.1)
Sodium: 132 mmol/L — ABNORMAL LOW (ref 135–145)
Total Bilirubin: 0.8 mg/dL (ref 0.3–1.2)
Total Protein: 6.8 g/dL (ref 6.5–8.1)

## 2022-12-06 MED ORDER — LACTATED RINGERS IV SOLN: INTRAVENOUS | Status: DC

## 2022-12-06 MED ORDER — LACTATED RINGERS IV BOLUS
1000.0000 mL | Freq: Once | INTRAVENOUS | Status: AC
  Administered 2022-12-06: 1000 mL via INTRAVENOUS

## 2022-12-06 MED ORDER — POTASSIUM CHLORIDE 10 MEQ/100ML IV SOLN
10.0000 meq | Freq: Once | INTRAVENOUS | Status: AC
  Administered 2022-12-06: 10 meq via INTRAVENOUS
  Filled 2022-12-06: qty 100

## 2022-12-06 MED ORDER — POTASSIUM CHLORIDE CRYS ER 20 MEQ PO TBCR
60.0000 meq | EXTENDED_RELEASE_TABLET | Freq: Once | ORAL | Status: AC
  Administered 2022-12-06: 60 meq via ORAL
  Filled 2022-12-06: qty 3

## 2022-12-06 NOTE — ED Triage Notes (Signed)
Pt BIBA from home. Family reports pt has had limited PO intake, and AMS for past 4 days. Pt normally able to walk, and talk w/o issue per EMS.  BP: 140/92 HR: 80 RR: 20 SPO2: 100 RA CBG: 92

## 2022-12-06 NOTE — ED Provider Notes (Signed)
[place] AT [facility] Provider Note   CSN: [id number] Arrival date & time: 12/06/22  1841     History  Chief Complaint  Patient presents with   Altered Mental Status    [name] is a 77 y.o. female.  77 year old female presents with 4 days of altered mental status.  She has had decreased oral intake.  She has not had any emesis or diarrhea.  No fever or chills reported.  Patient unable to walk but not currently.  She has not been eating the way she normally does.  Family called EMS and blood sugar was 92.  Transported here for further management       Home Medications Prior to Admission medications   Medication Sig Start Date End Date Taking? Authorizing Provider  albuterol (PROVENTIL HFA;VENTOLIN HFA) 108 (90 BASE) MCG/ACT inhaler Inhale 2 puffs into the lungs every 6 (six) hours as needed for wheezing or shortness of breath.    [provider]  ascorbic acid (VITAMIN C) 500 MG tablet Take 500 mg by mouth daily.    [provider]  aspirin EC 81 MG tablet Take 1 tablet (81 mg total) by mouth daily. Swallow whole. 11/20/22   Sheikh, Omair Latif, DO  BIOTIN EXTRA STRENGTH PO Take 500 mg by mouth daily.    [provider]  cholecalciferol (VITAMIN D3) 25 MCG (1000 UNIT) tablet Take 1,000 Units by mouth daily.    [provider]  clopidogrel (PLAVIX) 75 MG tablet Take 75 mg by mouth daily.    [provider]  diclofenac (VOLTAREN) 75 MG EC tablet Take 75 mg by mouth 2 (two) times daily as needed for mild pain.    [provider]  furosemide (LASIX) 20 MG tablet Take 1 tablet (20 mg total) by mouth daily as needed for edema. Patient taking differently: Take 20 mg by mouth daily. 12/27/19 09/07/23  Martinique, Peter M, MD  gabapentin (NEURONTIN) 600 MG tablet Take 600 mg by mouth 4 (four) times daily.     [provider]  losartan (COZAAR) 50 MG tablet Take 50 mg by mouth daily.     [provider]  metFORMIN (GLUCOPHAGE-XR) 500 MG 24 hr tablet Take 500 mg by mouth daily with breakfast.    [provider]  methocarbamol (ROBAXIN) 500 MG tablet Take 500 mg by mouth daily as needed for muscle spasms. 09/05/22   [provider]  Multiple Vitamins-Minerals (CENTRUM SILVER 50+WOMEN) TABS Take 1 tablet by mouth daily.    [provider]  ondansetron (ZOFRAN) 4 MG tablet Take 1 tablet (4 mg total) by mouth every 6 (six) hours as needed for nausea. 11/19/22   Raiford Noble Latif, DO  rosuvastatin (CRESTOR) 10 MG tablet TAKE 1 TABLET BY MOUTH EVERY DAY 10/23/22   Martinique, Peter M, MD  TYLENOL 500 MG tablet Take 1,000 mg by mouth every 6 (six) hours as needed for headache or mild pain.    [provider]      Allergies    Cefuroxime, Cephalexin, Chlorhexidine, Prednisone, and Zithromax [azithromycin dihydrate]    Review of Systems   Review of Systems  All other systems reviewed and are negative.   Physical Exam Updated Vital Signs SpO2 100%  Physical Exam Vitals and nursing note reviewed.  Constitutional:      General: She is not in acute distress.    Appearance: Normal appearance. She is well-developed. She is not toxic-appearing.  HENT:  Head: Normocephalic and atraumatic.  Eyes:     General: Lids are normal.     Conjunctiva/sclera: Conjunctivae normal.     Pupils: Pupils are equal, round, and reactive to light.  Neck:     Thyroid: No thyroid mass.     Trachea: No tracheal deviation.  Cardiovascular:     Rate and Rhythm: Normal rate and regular rhythm.     Heart sounds: Normal heart sounds. No murmur heard.    No gallop.  Pulmonary:     Effort: Pulmonary effort is normal. No respiratory distress.     Breath sounds: Normal breath sounds. No stridor. No decreased breath sounds, wheezing, rhonchi or rales.  Abdominal:     General: There is no distension.     Palpations: Abdomen is soft.     Tenderness: There is no  abdominal tenderness. There is no rebound.  Musculoskeletal:        General: No tenderness. Normal range of motion.     Cervical back: Normal range of motion and neck supple.  Skin:    General: Skin is warm and dry.     Findings: No abrasion or rash.  Neurological:     General: No focal deficit present.     Mental Status: She is alert and oriented to person, place, and time. Mental status is at baseline.     GCS: GCS eye subscore is 4. GCS verbal subscore is 5. GCS motor subscore is 6.     Cranial Nerves: No cranial nerve deficit.     Sensory: No sensory deficit.     Motor: Motor function is intact.  Psychiatric:        Attention and Perception: Attention normal.        Mood and Affect: Affect is flat.        Speech: Speech is delayed.        Behavior: Behavior normal.     ED Results / Procedures / Treatments   Labs (all labs ordered are listed, but only abnormal results are displayed) Labs Reviewed  CBC WITH DIFFERENTIAL/PLATELET  COMPREHENSIVE METABOLIC PANEL  URINALYSIS, ROUTINE W REFLEX MICROSCOPIC    EKG None  Radiology No results found.  Procedures Procedures    Medications Ordered in ED Medications  lactated ringers bolus 1,000 mL (has no administration in time range)  lactated ringers infusion (has no administration in time range)    ED Course/ Medical Decision Making/ A&P                             Medical Decision Making Amount and/or Complexity of Data Reviewed Labs: ordered. Radiology: ordered. ECG/medicine tests: ordered.  Risk Prescription drug management.   Patient has evidence of severe hypokalemia here with K of 2.6.  Magnesium added.  Started on IV potassium.  Due to altered mental status, had a head CT which per interpretation showed no acute findings here.  She has a chest x-ray per my interpretation shows no acute findings.  Patient began to sundown and required restraints.  Chest with hospitalist and patient to be  admitted       Final Clinical Impression(s) / ED Diagnoses Final diagnoses:  None    Rx / DC Orders ED Discharge Orders     None         Lacretia Leigh, MD 12/06/22 2324

## 2022-12-07 ENCOUNTER — Observation Stay (HOSPITAL_COMMUNITY): Payer: Medicare Other

## 2022-12-07 DIAGNOSIS — I10 Essential (primary) hypertension: Secondary | ICD-10-CM | POA: Diagnosis present

## 2022-12-07 DIAGNOSIS — Z9011 Acquired absence of right breast and nipple: Secondary | ICD-10-CM | POA: Diagnosis not present

## 2022-12-07 DIAGNOSIS — F03911 Unspecified dementia, unspecified severity, with agitation: Secondary | ICD-10-CM | POA: Diagnosis present

## 2022-12-07 DIAGNOSIS — G8929 Other chronic pain: Secondary | ICD-10-CM

## 2022-12-07 DIAGNOSIS — J4489 Other specified chronic obstructive pulmonary disease: Secondary | ICD-10-CM | POA: Diagnosis present

## 2022-12-07 DIAGNOSIS — B3731 Acute candidiasis of vulva and vagina: Secondary | ICD-10-CM | POA: Diagnosis present

## 2022-12-07 DIAGNOSIS — Z8673 Personal history of transient ischemic attack (TIA), and cerebral infarction without residual deficits: Secondary | ICD-10-CM | POA: Diagnosis not present

## 2022-12-07 DIAGNOSIS — E876 Hypokalemia: Secondary | ICD-10-CM | POA: Diagnosis present

## 2022-12-07 DIAGNOSIS — E119 Type 2 diabetes mellitus without complications: Secondary | ICD-10-CM | POA: Diagnosis present

## 2022-12-07 DIAGNOSIS — Z853 Personal history of malignant neoplasm of breast: Secondary | ICD-10-CM | POA: Diagnosis not present

## 2022-12-07 DIAGNOSIS — Z955 Presence of coronary angioplasty implant and graft: Secondary | ICD-10-CM | POA: Diagnosis not present

## 2022-12-07 DIAGNOSIS — G9341 Metabolic encephalopathy: Secondary | ICD-10-CM | POA: Diagnosis present

## 2022-12-07 DIAGNOSIS — E871 Hypo-osmolality and hyponatremia: Secondary | ICD-10-CM | POA: Diagnosis present

## 2022-12-07 DIAGNOSIS — I2510 Atherosclerotic heart disease of native coronary artery without angina pectoris: Secondary | ICD-10-CM | POA: Diagnosis present

## 2022-12-07 DIAGNOSIS — A0472 Enterocolitis due to Clostridium difficile, not specified as recurrent: Secondary | ICD-10-CM | POA: Diagnosis present

## 2022-12-07 DIAGNOSIS — Z1152 Encounter for screening for COVID-19: Secondary | ICD-10-CM | POA: Diagnosis not present

## 2022-12-07 DIAGNOSIS — Z8249 Family history of ischemic heart disease and other diseases of the circulatory system: Secondary | ICD-10-CM | POA: Diagnosis not present

## 2022-12-07 DIAGNOSIS — Z833 Family history of diabetes mellitus: Secondary | ICD-10-CM | POA: Diagnosis not present

## 2022-12-07 DIAGNOSIS — F17210 Nicotine dependence, cigarettes, uncomplicated: Secondary | ICD-10-CM | POA: Diagnosis present

## 2022-12-07 DIAGNOSIS — F05 Delirium due to known physiological condition: Secondary | ICD-10-CM | POA: Diagnosis present

## 2022-12-07 DIAGNOSIS — Z823 Family history of stroke: Secondary | ICD-10-CM | POA: Diagnosis not present

## 2022-12-07 DIAGNOSIS — R4182 Altered mental status, unspecified: Secondary | ICD-10-CM | POA: Insufficient documentation

## 2022-12-07 DIAGNOSIS — E785 Hyperlipidemia, unspecified: Secondary | ICD-10-CM | POA: Diagnosis present

## 2022-12-07 DIAGNOSIS — J439 Emphysema, unspecified: Secondary | ICD-10-CM | POA: Diagnosis present

## 2022-12-07 DIAGNOSIS — I252 Old myocardial infarction: Secondary | ICD-10-CM | POA: Diagnosis not present

## 2022-12-07 DIAGNOSIS — G894 Chronic pain syndrome: Secondary | ICD-10-CM | POA: Diagnosis present

## 2022-12-07 LAB — BASIC METABOLIC PANEL
Anion gap: 10 (ref 5–15)
BUN: 12 mg/dL (ref 8–23)
CO2: 23 mmol/L (ref 22–32)
Calcium: 8.5 mg/dL — ABNORMAL LOW (ref 8.9–10.3)
Chloride: 101 mmol/L (ref 98–111)
Creatinine, Ser: 0.48 mg/dL (ref 0.44–1.00)
GFR, Estimated: >60 mL/min (ref >60)
Glucose, Bld: 109 mg/dL — ABNORMAL HIGH (ref 70–99)
Potassium: 2.6 mmol/L — CL (ref 3.5–5.1)
Sodium: 134 mmol/L — ABNORMAL LOW (ref 135–145)

## 2022-12-07 LAB — RAPID URINE DRUG SCREEN, HOSP PERFORMED
Amphetamines: NOT DETECTED
Barbiturates: NOT DETECTED
Benzodiazepines: NOT DETECTED
Cocaine: NOT DETECTED
Opiates: POSITIVE — AB
Tetrahydrocannabinol: NOT DETECTED

## 2022-12-07 LAB — CLOSTRIDIUM DIFFICILE BY PCR, REFLEXED: Toxigenic C. Difficile by PCR: POSITIVE — AB

## 2022-12-07 LAB — MAGNESIUM
Magnesium: 1.8 mg/dL (ref 1.7–2.4)
Magnesium: 1.7 mg/dL (ref 1.7–2.4)

## 2022-12-07 LAB — RESP PANEL BY RT-PCR (RSV, FLU A&B, COVID)  RVPGX2
Influenza A by PCR: NEGATIVE
Influenza B by PCR: NEGATIVE
Resp Syncytial Virus by PCR: NEGATIVE
SARS Coronavirus 2 by RT PCR: NEGATIVE

## 2022-12-07 LAB — POTASSIUM: Potassium: 3.7 mmol/L (ref 3.5–5.1)

## 2022-12-07 LAB — C DIFFICILE QUICK SCREEN W PCR REFLEX
C Diff antigen: POSITIVE — AB
C Diff toxin: NEGATIVE

## 2022-12-07 MED ORDER — LABETALOL HCL 5 MG/ML IV SOLN: 10.0000 mg | INTRAVENOUS | Status: DC | PRN

## 2022-12-07 MED ORDER — ENOXAPARIN SODIUM 40 MG/0.4ML IJ SOSY
40.0000 mg | PREFILLED_SYRINGE | INTRAMUSCULAR | Status: DC
  Administered 2022-12-07 – 2022-12-10 (×4): 40 mg via SUBCUTANEOUS
  Filled 2022-12-07 (×4): qty 0.4

## 2022-12-07 MED ORDER — MAGNESIUM SULFATE 2 GM/50ML IV SOLN
2.0000 g | Freq: Once | INTRAVENOUS | Status: AC
  Administered 2022-12-07: 2 g via INTRAVENOUS
  Filled 2022-12-07: qty 50

## 2022-12-07 MED ORDER — POTASSIUM CHLORIDE 10 MEQ/100ML IV SOLN
10.0000 meq | INTRAVENOUS | Status: AC
  Administered 2022-12-07 (×5): 10 meq via INTRAVENOUS
  Filled 2022-12-07 (×5): qty 100

## 2022-12-07 MED ORDER — NYSTATIN 100000 UNIT/GM EX OINT
TOPICAL_OINTMENT | Freq: Two times a day (BID) | CUTANEOUS | Status: DC
  Filled 2022-12-07: qty 15

## 2022-12-07 MED ORDER — POTASSIUM CHLORIDE CRYS ER 20 MEQ PO TBCR
40.0000 meq | EXTENDED_RELEASE_TABLET | Freq: Once | ORAL | Status: AC
  Administered 2022-12-07: 40 meq via ORAL
  Filled 2022-12-07: qty 2

## 2022-12-07 MED ORDER — LOSARTAN POTASSIUM 50 MG PO TABS
50.0000 mg | ORAL_TABLET | Freq: Every day | ORAL | Status: DC
  Administered 2022-12-07 – 2022-12-08 (×2): 50 mg via ORAL
  Filled 2022-12-07 (×2): qty 1

## 2022-12-07 MED ORDER — ASPIRIN 81 MG PO TBEC
81.0000 mg | DELAYED_RELEASE_TABLET | Freq: Every day | ORAL | Status: DC
  Administered 2022-12-07 – 2022-12-10 (×4): 81 mg via ORAL
  Filled 2022-12-07 (×4): qty 1

## 2022-12-07 MED ORDER — ROSUVASTATIN CALCIUM 10 MG PO TABS
10.0000 mg | ORAL_TABLET | Freq: Every day | ORAL | Status: DC
  Administered 2022-12-07 – 2022-12-10 (×4): 10 mg via ORAL
  Filled 2022-12-07 (×4): qty 1

## 2022-12-07 MED ORDER — CLOPIDOGREL BISULFATE 75 MG PO TABS
75.0000 mg | ORAL_TABLET | Freq: Every day | ORAL | Status: DC
  Administered 2022-12-07 – 2022-12-10 (×4): 75 mg via ORAL
  Filled 2022-12-07 (×4): qty 1

## 2022-12-07 MED ORDER — AMLODIPINE BESYLATE 10 MG PO TABS
10.0000 mg | ORAL_TABLET | Freq: Every day | ORAL | Status: DC
  Administered 2022-12-07 – 2022-12-10 (×4): 10 mg via ORAL
  Filled 2022-12-07 (×4): qty 1

## 2022-12-07 NOTE — Assessment & Plan Note (Signed)
-  pt had numerous admissions in the past due to polypharmacy especially her opioids. She had her oxycodone discontinued and morphine dose lowered by pain management. Not sure if this episode is caused by polypharmacy as she is being tapered down. She is still on high dose Gabapentin which we will hold overnight  -she had 2 episode of bowel incontinence and has hx of C.diff. Will test for C.diff. -UA was negative but had lower abdominal pain. Check abd X-ray. May need CT A/P. -also check UDS -check COVID PCR

## 2022-12-07 NOTE — Progress Notes (Signed)
  Transition of Care [facility]) Screening Note   Patient Details  Name: [name] Date of Birth: 08/01/1946   Transition of Care [facility]) CM/SW Contact:    Henrietta Dine, RN Phone Number: 12/07/2022, 1:28 PM    Transition of Care Department [facility]) has reviewed patient and no TOC needs have been identified at this time. We will continue to monitor patient advancement through interdisciplinary progression rounds. If new patient transition needs arise, please place a TOC consult.

## 2022-12-07 NOTE — Assessment & Plan Note (Signed)
-  around genitalia and gluteal fold -nystatin cream BID

## 2022-12-07 NOTE — ED Notes (Signed)
Lab to add on Rapid Urine drug screen

## 2022-12-07 NOTE — Assessment & Plan Note (Signed)
-  continue aspirin and plavix

## 2022-12-07 NOTE — H&P (Signed)
History and Physical    Patient: [name] [id number] DOB: 1946/06/24 DOA: 12/06/2022 DOS: the patient was seen and examined on 12/07/2022 PCP: [name], FNP  Patient coming from: Home  Chief Complaint:  Chief Complaint  Patient presents with   Altered Mental Status   HPI: [name] is a 77 y.o. female with medical history significant of CAD s/p PCI, HTN, CVA, COPD, T2DM, C.diff, chronic pain on opioids who presents with AMS and weakness.   Hx obtained from husband over the phone as patient was alert and oriented only to self. Patient has been more lethargic and weak over the past few days.  Has mostly been sleeping in her bed and not eating. A few days ago she was still able to join her church group and make pillows.  She also had 2 episodes of bowel incontinence which is unusual as she is normally able to use the bathroom on her own.  He denies any fever.  No nausea or vomiting.  Patient has been admitted numerous times in the past in November for altered mental status and AKI thought to be polypharmacy including opioids. She was again admitted in February for altered mental status/lethargy that again was felt to be ongoing to polypharmacy.  She had reduction of her pain medications at discharge. However she continue to use the exact same dosage upon discharge and was readmitted again on 11/16/2022.  Husband says that her primary pain management Dr. Nelva Bush has discontinued her oxycodone and has decrease her morphine dosage. She takes her own medications and her husband checks after that. He feels that she has been taking them appropriately and there has not been any leftover opioids from previous dosages.   In the ED, she was afebrile, heart rate of 60, BP of 174/90 and on 100% on room air.  No leukocytosis, hemoglobin 11.5.  Sodium of 132, hypokalemia of 2.6, creatinine of 0.51, CBG of 104.  Magnesium is pending.  CT head is negative.  UA is negative.   Review of  Systems: unable to review all systems due to the inability of the patient to answer questions. Past Medical History:  Diagnosis Date   Asthma    Breast cancer ([place])    right breast   COPD (chronic obstructive pulmonary disease) (HCC)    Coronary artery disease    Diverticulum of esophagus    Elevated LFTs    Emphysema lung (HCC)    ETOH abuse    H/O atrial fibrillation without current medication    only one time when she had sepsis   Hyperlipidemia    Hypertension    hx of but not on any medications   Myocardial infarction ([place]) 2000   OA (osteoarthritis) of knee    Osteoarthritis    Tobacco abuse    Past Surgical History:  Procedure Laterality Date   ANTERIOR HIP REVISION Right 10/22/2015   Procedure: RIGHT  HIP REVISION;  Surgeon: Paralee Cancel, MD;  Location: [facility];  Service: Orthopedics;  Laterality: Right;   APPLICATION OF WOUND VAC N/A 06/20/2015   Procedure: APPLICATION OF INCISIONAL WOUND VAC;  Surgeon: Melina Schools, MD;  Location: [place];  Service: Orthopedics;  Laterality: N/A;   BREAST SURGERY  1991   right mastectomy   CARDIAC CATHETERIZATION  04/05/2009   EF 60%   CARDIOVASCULAR STRESS TEST  01/31/2005   EF 58%   CESAREAN SECTION  '78, '80, '81   x 3   CORONARY ANGIOPLASTY  08/1998  x2 OF A BIFURCATION OM-1, OM-2 LESION   CORONARY ANGIOPLASTY WITH STENT PLACEMENT  01/1999   MID FIRST OBTUSE MARGINAL VESSEL   CORONARY ANGIOPLASTY WITH STENT PLACEMENT  07/1999   STENTING AT THE CRUX OF THE RIGHT CORONARY ARTERY WITH A 3.8MM X 18MM TETRA STENT   DIRECT LARYNGOSCOPY N/A 05/03/2015   Procedure: DIRECT LARYNGOSCOPY;  Surgeon: Jodi Marble, MD;  Location: [place];  Service: ENT;  Laterality: N/A;   EYE SURGERY  05/18/2014,06/01/2014   BILATERAL CATARACT S WITH LENS IMPLANTS   GASTROSTOMY N/A 05/04/2015   Procedure: OPEN GASTROSTOMY WITH TUBE PLACEMENT;  Surgeon: Donnie Mesa, MD;  Location: [place];  Service: General;  Laterality: N/A;   GASTROSTOMY N/A 11/13/2016    Procedure: INSERTION OF GASTROSTOMY TUBE;  Surgeon: Coralie Keens, MD;  Location: [place];  Service: General;  Laterality: N/A;   HARDWARE REMOVAL N/A 05/03/2015   Procedure: HARDWARE REMOVAL;  Surgeon: Melina Schools, MD;  Location: [place];  Service: Orthopedics;  Laterality: N/A;   HIP CLOSED REDUCTION Right 04/26/2015   Procedure: CLOSED REDUCTION HIP;  Surgeon: Melina Schools, MD;  Location: [facility];  Service: Orthopedics;  Laterality: Right;   HYSTEROSCOPY     D & C   INCISION AND DRAINAGE ABSCESS N/A 05/03/2015   Procedure: INCISION AND DRAINAGE CERVICAL  ABSCESS AND REMOVAL OF HARDWARE;  Surgeon: Melina Schools, MD;  Location: [place];  Service: Orthopedics;  Laterality: N/A;   IR CM INJ ANY COLONIC TUBE W/FLUORO  10/14/2017   IR CM INJ ANY COLONIC TUBE W/FLUORO  10/23/2017   IR CM INJ ANY COLONIC TUBE W/FLUORO  10/28/2017   IR REPLC GASTRO/COLONIC TUBE PERCUT W/FLUORO  09/22/2017   JOINT REPLACEMENT  08/2011   bilateral hip   JOINT REPLACEMENT  01/2012   right hip   MASTECTOMY     neck fusion  2011   ORIF FEMUR FRACTURE Right 06/14/2019   Procedure: ORIF PERI PROSTHETIC FEMUR FRACTURE;  Surgeon: Paralee Cancel, MD;  Location: [place];  Service: Orthopedics;  Laterality: Right;   PELVIC LAPAROSCOPY  2002   RSO-     RADICAL NECK DISSECTION N/A 11/08/2016   Procedure: INCISION AND DRAINAGE OF NECK ABSCESS;  Surgeon: Jerrell Belfast, MD;  Location: [place];  Service: ENT;  Laterality: N/A;   RADIOLOGY WITH ANESTHESIA Right 06/28/2015   Procedure: MRI OF CERVICAL SPINE  AND RIGHT HIP  WITH AND WITHOUT CONTRAST    (RADIOLOGY WITH ANESTHESIA);  Surgeon: Medication Radiologist, MD;  Location: [place];  Service: Radiology;  Laterality: Right;   REMOVAL OF GASTROSTOMY TUBE N/A 11/14/2016   Procedure: REMOVAL OF GASTROSTOMY TUBE W/ REPLACEMENT OF GASTROSTOMY TUBE;  Surgeon: Coralie Keens, MD;  Location: [place];  Service: General;  Laterality: N/A;   RIGID ESOPHAGOSCOPY N/A 05/03/2015   Procedure: RIGID  ESOPHAGOSCOPY;  Surgeon: Jodi Marble, MD;  Location: [place];  Service: ENT;  Laterality: N/A;   TONSILLECTOMY AND ADENOIDECTOMY     TOTAL HIP ARTHROPLASTY  08/2010   bilat   VULVECTOMY  1981   partial   Social History:  reports that she has been smoking cigarettes. She has been smoking an average of 1 pack per day. She has never used smokeless tobacco. She reports that she does not drink alcohol and does not use drugs.  Allergies  Allergen Reactions   Cefuroxime Other (See Comments)    Oral ulcers   Cephalexin Other (See Comments)    Took off first layer of skin inside of mouth  Chlorhexidine Other (See Comments)    Mouth broke out- oral ulcers   Prednisone Other (See Comments)    Possible oral ulcers   Zithromax [Azithromycin Dihydrate] Other (See Comments)    ORAL ULCERS     Family History  Problem Relation Age of Onset   Diabetes Mother    Hypertension Father    Heart disease Father    Heart attack Father    Stroke Father     Prior to Admission medications   Medication Sig Start Date End Date Taking? Authorizing Provider  albuterol (PROVENTIL HFA;VENTOLIN HFA) 108 (90 BASE) MCG/ACT inhaler Inhale 2 puffs into the lungs every 6 (six) hours as needed for wheezing or shortness of breath.   Yes [provider]  ascorbic acid (VITAMIN C) 500 MG tablet Take 500 mg by mouth daily.   Yes [provider]  aspirin EC 81 MG tablet Take 1 tablet (81 mg total) by mouth daily. Swallow whole. 11/20/22  Yes Sheikh, Omair Latif, DO  cholecalciferol (VITAMIN D3) 25 MCG (1000 UNIT) tablet Take 1,000 Units by mouth daily.   Yes [provider]  clopidogrel (PLAVIX) 75 MG tablet Take 75 mg by mouth daily.   Yes [provider]  furosemide (LASIX) 20 MG tablet Take 1 tablet (20 mg total) by mouth daily as needed for edema. Patient taking differently: Take 20 mg by mouth in the morning. 12/27/19 09/07/23 Yes Martinique, Peter M, MD  gabapentin (NEURONTIN) 600 MG  tablet Take 600 mg by mouth 4 (four) times daily.    Yes [provider]  losartan (COZAAR) 50 MG tablet Take 50 mg by mouth daily.   Yes [provider]  metFORMIN (GLUCOPHAGE-XR) 500 MG 24 hr tablet Take 500 mg by mouth daily with breakfast.   Yes [provider]  methocarbamol (ROBAXIN) 500 MG tablet Take 500 mg by mouth daily as needed for muscle spasms. 09/05/22  Yes [provider]  morphine (MS CONTIN) 15 MG 12 hr tablet Take 15 mg by mouth every 12 (twelve) hours.   Yes [provider]  Multiple Vitamins-Minerals (CENTRUM SILVER 50+WOMEN) TABS Take 1 tablet by mouth daily with breakfast.   Yes [provider]  ondansetron (ZOFRAN) 4 MG tablet Take 1 tablet (4 mg total) by mouth every 6 (six) hours as needed for nausea. 11/19/22  Yes Sheikh, Omair Latif, DO  rosuvastatin (CRESTOR) 10 MG tablet TAKE 1 TABLET BY MOUTH EVERY DAY 10/23/22  Yes Martinique, Peter M, MD  TYLENOL 500 MG tablet Take 1,000 mg by mouth every 6 (six) hours as needed for headache or mild pain.   Yes [provider]  BIOTIN EXTRA STRENGTH PO Take 500 mg by mouth daily.    [provider]  diclofenac (VOLTAREN) 75 MG EC tablet Take 75 mg by mouth 2 (two) times daily as needed for mild pain.    [provider]    Physical Exam: Vitals:   12/06/22 1909 12/06/22 2041 12/06/22 2130 12/06/22 2231  BP: (!) 176/100 (!) 153/95 (!) 174/90   Pulse: 73 66 65   Resp: 20 19 18    Temp:  99 F (37.2 C)  98.2 F (36.8 C)  TempSrc:    Rectal  SpO2: 99% 99% 99%    Constitutional: NAD, thin, chronically ill-appearing thin cachetic female laying in bed with mittens and blankets thrown on the ground Eyes: lids and conjunctivae normal ENMT: Mucous membranes are moist.  Neck: normal, supple Respiratory: clear to auscultation bilaterally,  no wheezing, no crackles. Normal respiratory effort. No accessory muscle use.  Cardiovascular: Regular rate and rhythm, no  murmurs / rubs / gallops. No extremity edema.  Abdomen: Soft, nontender distended with lower abdominal tenderness with palpation.  Bowel sounds positive.  Musculoskeletal: no clubbing / cyanosis. No joint deformity upper and lower extremities.  Skin: erythematous moist plaques to groin/genitalia and gluteal fold Neurologic: CN 2-12 grossly intact. Alert and oriented only to self. Otherwise not responsive to questioning Psychiatric: normal mood. Alerted mentation.  Data Reviewed:  See HPI  Assessment and Plan: * Acute metabolic encephalopathy -pt had numerous admissions in the past due to polypharmacy especially her opioids. She had her oxycodone discontinued and morphine dose lowered by pain management. Not sure if this episode is caused by polypharmacy as she is being tapered down. She is still on high dose Gabapentin which we will hold overnight  -she had 2 episode of bowel incontinence and has hx of C.diff. Will test for C.diff. -UA was negative but had lower abdominal pain. Check abd X-ray. May need CT A/P. -also check UDS -check COVID PCR   Chronic pain -holding Gabapentin and MS contin overnight with acute encephalopathy -has been working with pain management outpatient to continual tapering of her regimen   Essential hypertension -elevated -continue losartan  Hyperlipidemia -continue statin  Candidiasis of female genitalia -around genitalia and gluteal fold -nystatin cream BID  History of CVA (cerebrovascular accident) -continue aspirin and plavix  Hypokalemia -potassium of 2.6 on presentation. -administered 46meq of potassium and IV 36meq in ED -follow repeat in the morning -follow Mg level       Advance Care Planning:   Code Status: Full Code   Consults: NONE  Family Communication: husband over the phone  Severity of Illness: The appropriate patient status for this patient is OBSERVATION. Observation status is judged to be reasonable and necessary in  order to provide the required intensity of service to ensure the patient's safety. The patient's presenting symptoms, physical exam findings, and initial radiographic and laboratory data in the context of their medical condition is felt to place them at decreased risk for further clinical deterioration. Furthermore, it is anticipated that the patient will be medically stable for discharge from the hospital within 2 midnights of admission.   Author: Orene Desanctis, DO 12/07/2022 12:35 AM  For on call review [email or web address].

## 2022-12-07 NOTE — Assessment & Plan Note (Addendum)
-  potassium of 2.6 on presentation. -administered 2meq of potassium and IV 61meq in ED -follow repeat in the morning -follow Mg level

## 2022-12-07 NOTE — Assessment & Plan Note (Signed)
continue statin

## 2022-12-07 NOTE — Assessment & Plan Note (Signed)
-  elevated -continue losartan

## 2022-12-07 NOTE — Plan of Care (Signed)
  Problem: Education: Goal: Knowledge of disease or condition will improve Outcome: Progressing   Problem: Nutrition: Goal: Risk of aspiration will decrease Outcome: Progressing   Problem: Clinical Measurements: Goal: Ability to maintain clinical measurements within normal limits will improve Outcome: Progressing Goal: Will remain free from infection Outcome: Progressing   Problem: Activity: Goal: Risk for activity intolerance will decrease Outcome: Progressing   Problem: Nutrition: Goal: Adequate nutrition will be maintained Outcome: Progressing   Problem: Elimination: Goal: Will not experience complications related to bowel motility Outcome: Progressing   Problem: Pain Managment: Goal: General experience of comfort will improve Outcome: Progressing   Problem: Safety: Goal: Ability to remain free from injury will improve Outcome: Progressing   Problem: Skin Integrity: Goal: Risk for impaired skin integrity will decrease Outcome: Progressing

## 2022-12-07 NOTE — Assessment & Plan Note (Addendum)
-  holding Gabapentin and MS contin overnight with acute encephalopathy -has been working with pain management outpatient to continual tapering of her regimen

## 2022-12-07 NOTE — Progress Notes (Signed)
Brief same-day note  Patient is a 25 female with history of coronary artery disease, status post PCI, hypertension, CVA, COPD, type 2 diabetes, C. difficile, chronic pain syndrome on opiates who presented from home with altered mentation, weakness.  History was taken from husband.  As per the report, she was more lethargic and weak over the last few days.  She has been admitted numerous times in the past for altered mental status and AKI thought to be from polypharmacy including opiates.  The last one was in February this year.  She follows with pain management Dr. Nelva Bush.  On presentation, she was  hypertensive otherwise hemodynamically stable.  Lab work showed potassium of 2.6.  CT head was negative for acute findings.  Urinalysis not suspicious for UTI.  Patient was admitted for further management. Patient seen and examined at bedside this morning.  During evaluation, she looked comfortable, she had mittens on bilateral hands.  She is alert and awake, communicates and follows commands but looks confused.Not in any distress  Assessment and plan:  Acute metabolic encephalopathy: Thought to be secondary to polypharmacy.  Numerous admissions in the past with the same.  Follows with pain management.  Recently her provider had discontinued oxycodone and put her on morphine.  She is also on gabapentin.  Continue to monitor.  Positive C. difficile: This is most likely a colonization.  She has history of C. difficile in the past.  C. difficile antigen/PCR positive with negative toxin.  no leukocytosis or AKI.   Abdominal x-ray did not show any acute findings. Will continue to hold treatment.Looks like she has chronic diarrhoea  Chronic pain syndrome: On gabapentin, MS Contin at home  Currently on hold due to encephalopathy  Hypertension: Blood pressure elevated.  Continue losartan.  Monitor.Added amlodipine.  Continue as needed meds for severe hypertension  Hyperlipidemia: On statin  Candidiasis of female  genitalia: Suspected to have fungal infection around genitalia and gluteal fold.  Nystatin cream ordered  History of CVA: On aspirin, Plavix  Severe hypokalemia: Potassium of 2.6.  Currently being aggressively supplemented.  Magnesium of 1.7, supplemented.  Will continue to monitor the levels

## 2022-12-08 DIAGNOSIS — G9341 Metabolic encephalopathy: Secondary | ICD-10-CM | POA: Diagnosis not present

## 2022-12-08 LAB — CBC
HCT: 35.3 % — ABNORMAL LOW (ref 36.0–46.0)
Hemoglobin: 12.2 g/dL (ref 12.0–15.0)
MCH: 30.8 pg (ref 26.0–34.0)
MCHC: 34.6 g/dL (ref 30.0–36.0)
MCV: 89.1 fL (ref 80.0–100.0)
Platelets: 271 10*3/uL (ref 150–400)
RBC: 3.96 MIL/uL (ref 3.87–5.11)
RDW: 14.6 % (ref 11.5–15.5)
WBC: 6.9 10*3/uL (ref 4.0–10.5)
nRBC: 0 % (ref 0.0–0.2)

## 2022-12-08 LAB — POTASSIUM: Potassium: 3.4 mmol/L — ABNORMAL LOW (ref 3.5–5.1)

## 2022-12-08 LAB — BASIC METABOLIC PANEL
Anion gap: 12 (ref 5–15)
BUN: 8 mg/dL (ref 8–23)
CO2: 21 mmol/L — ABNORMAL LOW (ref 22–32)
Calcium: 8.4 mg/dL — ABNORMAL LOW (ref 8.9–10.3)
Chloride: 98 mmol/L (ref 98–111)
Creatinine, Ser: 0.41 mg/dL — ABNORMAL LOW (ref 0.44–1.00)
GFR, Estimated: >60 mL/min (ref >60)
Glucose, Bld: 91 mg/dL (ref 70–99)
Potassium: 2.9 mmol/L — ABNORMAL LOW (ref 3.5–5.1)
Sodium: 131 mmol/L — ABNORMAL LOW (ref 135–145)

## 2022-12-08 LAB — MAGNESIUM: Magnesium: 1.8 mg/dL (ref 1.7–2.4)

## 2022-12-08 MED ORDER — POTASSIUM CHLORIDE CRYS ER 20 MEQ PO TBCR
40.0000 meq | EXTENDED_RELEASE_TABLET | Freq: Once | ORAL | Status: AC
  Administered 2022-12-08: 40 meq via ORAL
  Filled 2022-12-08: qty 2

## 2022-12-08 MED ORDER — LOSARTAN POTASSIUM 50 MG PO TABS
100.0000 mg | ORAL_TABLET | Freq: Every day | ORAL | Status: DC
  Administered 2022-12-09 – 2022-12-10 (×2): 100 mg via ORAL
  Filled 2022-12-08 (×2): qty 2

## 2022-12-08 MED ORDER — MELATONIN 3 MG PO TABS
3.0000 mg | ORAL_TABLET | Freq: Once | ORAL | Status: AC
  Administered 2022-12-08: 3 mg via ORAL
  Filled 2022-12-08: qty 1

## 2022-12-08 MED ORDER — MAGNESIUM SULFATE 2 GM/50ML IV SOLN
2.0000 g | Freq: Once | INTRAVENOUS | Status: AC
  Administered 2022-12-08: 2 g via INTRAVENOUS
  Filled 2022-12-08: qty 50

## 2022-12-08 MED ORDER — POTASSIUM CHLORIDE 10 MEQ/100ML IV SOLN
10.0000 meq | INTRAVENOUS | Status: AC
  Administered 2022-12-08 (×6): 10 meq via INTRAVENOUS
  Filled 2022-12-08 (×6): qty 100

## 2022-12-08 MED ORDER — VANCOMYCIN HCL 125 MG PO CAPS
125.0000 mg | ORAL_CAPSULE | Freq: Four times a day (QID) | ORAL | Status: DC
  Administered 2022-12-08 – 2022-12-10 (×9): 125 mg via ORAL
  Filled 2022-12-08 (×11): qty 1

## 2022-12-08 NOTE — TOC Initial Note (Signed)
Transition of Care [facility]) - Initial/Assessment Note   Patient Details  Name: [name] MRN: [id number] Date of Birth: [birth year]  Transition of Care [facility]) CM/SW Contact:    Sherie Don, LCSW Phone Number: 12/08/2022, 12:01 PM  Clinical Narrative: PT evaluation recommended SNF. As patient is currently oriented to self only, CSW spoke with patient's spouse and daughter regarding recommendation. Family is agreeable to SNF and daughter would like patient to stay in the [place] area if possible.  FL2 done; PASRR confirmed. Initial referral faxed out. TOC awaiting bed offers.  Expected Discharge Plan: Skilled Nursing Facility Barriers to Discharge: Ship broker, Continued Medical Work up, SNF Pending bed offer  Patient Goals and CMS Choice Patient states their goals for this hospitalization and ongoing recovery are:: Go to rehab before returning home CMS Medicare.gov Compare Post Acute Care list provided to:: Patient Represenative (must comment) Choice offered to / list presented to : Adult Children, Spouse  Expected Discharge Plan and Services In-house Referral: Clinical Social Work Post Acute Care Choice: [place] Living arrangements for the past 2 months: Single Family Home           DME Arranged: N/A DME Agency: NA  Prior Living Arrangements/Services Living arrangements for the past 2 months: Single Family Home Lives with:: Spouse Patient language and need for interpreter reviewed:: Yes Do you feel safe going back to the place where you live?: Yes      Need for Family Participation in Patient Care: Yes (Comment) (Patient is currently oriented to self only.) Care giver support system in place?: Yes (comment) Criminal Activity/Legal Involvement Pertinent to Current Situation/Hospitalization: No - Comment as needed  Permission Sought/Granted Permission sought to share information with : Facility Art therapist granted to share  information with : Yes, Verbal Permission Granted Permission granted to share info w AGENCY: SNFs  Emotional Assessment Orientation: : Oriented to Self  Admission diagnosis:  Altered mental status, unspecified altered mental status type [R41.82] AMS (altered mental status) [id number] Metabolic encephalopathy [id number] Patient Active Problem List   Diagnosis Date Noted   AMS (altered mental status) 12/07/2022   History of CVA (cerebrovascular accident) 12/07/2022   Candidiasis of female genitalia 99991111   Metabolic encephalopathy 99991111   Acute gastroenteropathy due to Norovirus 11/18/2022   Acute encephalopathy 11/16/2022   Acute ischemic stroke ([place]) 09/06/2022   Protein-calorie malnutrition, severe 0000000   Acute metabolic encephalopathy [id number]   Prediabetes 08/08/2022   Falls, initial encounter 08/08/2022   C. 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   Back pain 02/21/2022   Cardiovascular symptoms 02/21/2022   Contusion of abdominal wall 02/21/2022   Cough 02/21/2022   Decreased estrogen level 02/21/2022   Diarrhea 02/21/2022   Disorder of breast 02/21/2022   Encounter for screening mammogram for malignant neoplasm of breast 02/21/2022   Former smoker 02/21/2022   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[place]) 02/21/2022   Hardening of the aorta (main artery of the heart) ([place]) 02/21/2022   Iron deficiency anemia due to chronic blood loss 02/21/2022   Kyphosis 02/21/2022   Malignant neoplasm of female breast ([place]) 02/21/2022   Neck pain 02/21/2022   Orthopnea 02/21/2022   Other long term (current) drug therapy 02/21/2022   Pedal edema 02/21/2022   Proteinuria 02/21/2022   Secondary kyphosis  deformity of spine 02/21/2022   Smoker  02/21/2022   Standard chest x-ray abnormal 02/21/2022   Temperature elevated 02/21/2022   DM2 (diabetes mellitus, type 2) ([place]) 02/21/2022   Varicose veins of lower extremity with inflammation 02/21/2022   Acute postoperative pain 11/19/2021   Chronic prescription opiate use 11/19/2021   S/P flap graft 11/19/2021   Raynaud's disease 05/28/2021   Acquired trigger finger of right ring finger 05/27/2021   Pain in right hand 05/27/2021   Closed fracture of shaft of right femur ([place]) 06/13/2019   Cervical radiculopathy 03/16/2019   Gastrostomy tube dysfunction ([place]) 06/25/2018   Impingement syndrome of left shoulder region 05/24/2018   Impingement syndrome of right shoulder region 05/24/2018   Osteoarthritis 03/01/2018   Fracture of multiple ribs of left side 11/29/2017   History of breast cancer 11/29/2017   Pressure injury of sacral region, stage 1 11/29/2017   Pathological fracture of pelvis due to age-related osteoporosis 11/22/2017   Syncope and collapse 11/22/2017   Empyema with fistula ([place]) 11/11/2017   Chronic pain 09/20/2017   CAD (coronary artery disease) 01/26/2017   History of atrial fibrillation 01/26/2017   Tobacco consumption 02/12/2016   S/P right TH revision 10/22/2015   S/P revision of total hip 10/22/2015   Cholecystitis    Esophageal fistula    Abscess    Discitis    HCAP (healthcare-associated pneumonia)    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   Abscess of neck    Esophageal perforation    Loss of weight    Esophageal dysphagia    AKI (acute kidney injury) ([place])    Pneumonia    Respiratory failure ([place])    Hypokalemia 08/26/2014   Hyponatremia 08/26/2014   COPD with acute exacerbation ([place])    Tobacco dependence    Hyperlipidemia    Essential hypertension    CAD S/P percutaneous coronary angioplasty    PCP:  [name], FNP Pharmacy:   [id number] - [place], [place] [address] [place] [address] Phone:  [phone] Fax: [phone]  Social Determinants of Health (SDOH) Social History: SDOH Screenings   Food Insecurity: No Food Insecurity (11/16/2022)  Housing: Low Risk  (11/16/2022)  Transportation Needs: No Transportation Needs (11/16/2022)  Utilities: Not At Risk (11/16/2022)  Tobacco Use: High Risk (11/16/2022)   SDOH Interventions:    Readmission Risk Interventions    11/02/2022   12:01 PM  Readmission Risk Prevention Plan  Transportation Screening Complete  PCP or Specialist Appt within 5-7 Days Complete  Home Care Screening Complete  Medication Review (RN CM) Complete

## 2022-12-08 NOTE — Plan of Care (Signed)

## 2022-12-08 NOTE — Evaluation (Signed)
Physical Therapy Evaluation Patient Details Name: [name] MRN: [id number] DOB: Oct 05, 1945 Today's Date: 12/08/2022  History of Present Illness  77 yo female admitted with acute encephalopathy/AMS, FTT, weakness. Ultimately sent in to ER from home for lethargy and confusion. CT head was negative for acute findings.  Urinalysis not suspicious for UTI. Medical hx of CAD, COPD, breast CA s/p mastectomy, MI, Bil THR, PE, DM, Chronic pain syndrome with back pain, and ETOH abuse.  Clinical Impression  Pt admitted with above diagnosis.  Pt currently with functional limitations due to the deficits listed below (see PT Problem List). Pt will benefit from acute skilled PT to increase their independence and safety with mobility to allow discharge.   Patient presents with significant debility. Patient having frequent loose BM's so only assisted with bed mobility and sitting.  RN reports patient 1 assist for transfers to [facility] yesterday.  Continue mobility with PT as tolerated.     Recommendations for follow up therapy are one component of a multi-disciplinary discharge planning process, led by the attending physician.  Recommendations may be updated based on patient status, additional functional criteria and insurance authorization.  Follow Up Recommendations Can patient physically be transported by private vehicle: No     Assistance Recommended at Discharge Frequent or constant Supervision/Assistance  Patient can return home with the following  Two people to help with walking and/or transfers;A lot of help with bathing/dressing/bathroom;Assistance with cooking/housework;Assist for transportation;Help with stairs or ramp for entrance    Equipment Recommendations None recommended by PT  Recommendations for Other Services       Functional Status Assessment Patient has had a recent decline in their functional status and demonstrates the ability to make significant improvements in function in a  reasonable and predictable amount of time.     Precautions / Restrictions Precautions Precautions: Fall Precaution Comments: cdiff/diarrhea Restrictions Weight Bearing Restrictions: No      Mobility  Bed Mobility   Bed Mobility: Rolling, Sidelying to Sit, Sit to Sidelying Rolling: Min assist Sidelying to sit: Mod assist   Sit to supine: Mod assist   General bed mobility comments: Increased time. Cues for safety.. Needs support at trunk and legs    Transfers                   General transfer comment: pt. declined    Ambulation/Gait                  Stairs            Wheelchair Mobility    Modified Rankin (Stroke Patients Only)       Balance   Sitting-balance support: Bilateral upper extremity supported Sitting balance-Leahy Scale: Fair                                       Pertinent Vitals/Pain Pain Assessment Faces Pain Scale: Hurts worst Pain Location: back and rectum Pain Descriptors / Indicators: Aching, Grimacing Pain Intervention(s): Monitored during session    Home Living Family/patient expects to be discharged to:: Private residence Living Arrangements: Spouse/significant other Available Help at Discharge: Family;Available 24 hours/day Type of Home: House Home Access: Level entry       Home Layout: Multi-level;Able to live on main level with bedroom/bathroom Home Equipment: Rollator (4 wheels);Wheelchair - manual      Prior Function Prior Level of Function : Needs assist  Mobility Comments: pt limited in giving info. states spouse assists her,       Hand Dominance   Dominant Hand: Right    Extremity/Trunk Assessment   Upper Extremity Assessment Upper Extremity Assessment: Generalized weakness    Lower Extremity Assessment Lower Extremity Assessment: Generalized weakness    Cervical / Trunk Assessment Cervical / Trunk Assessment: Kyphotic Cervical / Trunk Exceptions:  Severe appearing scoliosis/kyphosis  Communication   Communication: No difficulties  Cognition Arousal/Alertness: Awake/alert Behavior During Therapy: WFL for tasks assessed/performed Overall Cognitive Status: No family/caregiver present to determine baseline cognitive functioning                   Orientation Level: Disoriented to, Situation, Time             General Comments: slow to respond and answer orientation, states she has not been eating, does ambulate with rollator        General Comments      Exercises     Assessment/Plan    PT Assessment Patient needs continued PT services  PT Problem List Decreased strength;Decreased activity tolerance;Decreased balance;Decreased mobility;Decreased knowledge of use of DME;Decreased cognition       PT Treatment Interventions DME instruction;Gait training;Stair training;Functional mobility training;Therapeutic activities;Therapeutic exercise;Patient/family education;Cognitive remediation    PT Goals (Current goals can be found in the Care Plan section)  Acute Rehab PT Goals Patient Stated Goal: none stated PT Goal Formulation: With patient Time For Goal Achievement: 12/22/22 Potential to Achieve Goals: Fair    Frequency Min 2X/week     Co-evaluation               AM-PAC PT "6 Clicks" Mobility  Outcome Measure Help needed turning from your back to your side while in a flat bed without using bedrails?: A Lot Help needed moving from lying on your back to sitting on the side of a flat bed without using bedrails?: A Lot Help needed moving to and from a bed to a chair (including a wheelchair)?: Total Help needed standing up from a chair using your arms (e.g., wheelchair or bedside chair)?: Total Help needed to walk in hospital room?: Total Help needed climbing 3-5 steps with a railing? : Total 6 Click Score: 8    End of Session   Activity Tolerance: Patient limited by fatigue Patient left: in bed;with  call bell/phone within reach;with bed alarm set Nurse Communication: Mobility status PT Visit Diagnosis: Muscle weakness (generalized) (M62.81);Difficulty in walking, not elsewhere classified (R26.2)    Time: 1030-1050 PT Time Calculation (min) (ACUTE ONLY): 20 min   Charges:   PT Evaluation $PT Eval Low Complexity: 1 Low          [place] Office [phone] Weekend [id number]   Claretha Cooper 12/08/2022, 11:39 AM

## 2022-12-08 NOTE — Progress Notes (Signed)
PROGRESS NOTE  [name]  [id number] DOB: 1946-09-07 DOA: 12/06/2022 PCP: [name], FNP   Brief Narrative: Patient is a 25 female with history of coronary artery disease, status post PCI, hypertension, CVA, COPD, type 2 diabetes, C. difficile, chronic pain syndrome on opiates who presented from home with altered mentation, weakness.  History was taken from husband.  As per the report, she was more lethargic and weak over the last few days.  She has been admitted numerous times in the past for altered mental status and AKI thought to be from polypharmacy including opiates.  The last one was in February this year.  She follows with pain management Dr. Nelva Bush.  On presentation, she was  hypertensive otherwise hemodynamically stable.  Lab work showed potassium of 2.6.  CT head was negative for acute findings.  Urinalysis not suspicious for UTI.  Patient was admitted for further management.  Patient noted to have diarrhea, C. difficile antigen/PCR came out to be positive with negative toxin but since she still has persistent diarrhea, started on oral vancomycin.  PT recommended SNF.  TOC consulted and following  Assessment & Plan:  Principal Problem:   Acute metabolic encephalopathy Active Problems:   Chronic pain   Hyperlipidemia   Essential hypertension   Hypokalemia   History of CVA (cerebrovascular accident)   Candidiasis of female genitalia   Metabolic encephalopathy  Acute metabolic encephalopathy: Thought to be secondary to polypharmacy.  Numerous admissions in the past with the same.  Follows with pain management.  Recently her provider had discontinued oxycodone and put her on morphine.  She is also on gabapentin.  Continue to monitor. Mental status has significantly improved today, currently at baseline.  Oriented to place.   Positive C. 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  C. difficile antigen/PCR positive with negative toxin.  no leukocytosis or AKI.    Abdominal x-ray did not show any acute findings. But patient has frequent diarrhea so started on 10-day course of vancomycin   chronic pain syndrome: On gabapentin, MS Contin at home  Currently on hold due to encephalopathy   Hypertension: Blood pressure elevated.  Continue losartan with increased dose.  Monitor.Added amlodipine.  Continue as needed meds for severe hypertension   Hyperlipidemia: On statin   Candidiasis of female genitalia: Suspected to have fungal infection around genitalia and gluteal fold.  Nystatin cream ordered   History of CVA: On aspirin, Plavix   Severe hypokalemia: Potassium and magnesium being supplemented and monitored        DVT prophylaxis:enoxaparin (LOVENOX) injection 40 mg Start: 12/07/22 1000     Code Status: Full Code  Family Communication: Called and discussed with husband on phone on 3/25  Patient status:Inpatient  Patient is from :home  Anticipated discharge to:SNF  Estimated DC date:1-2 days   Consultants: None  Procedures:None  Antimicrobials:  Anti-infectives (From admission, onward)    Start     Dose/Rate Route Frequency Ordered Stop   12/08/22 1200  vancomycin (VANCOCIN) capsule 125 mg        125 mg Oral 4 times daily 12/08/22 1045 12/18/22 1359       Subjective: Patient seen and examined bedside today.  Hemodynamically stable.  She looks more alert and communicates well.  Obeys commands.  She knew that today she is in the hospital.  When I was there in the room, she had a very watery diarrhea.  Denies abdominal pain.  Objective: Vitals:  12/07/22 1426 12/07/22 1802 12/07/22 2110 12/08/22 0531  BP: (!) 172/84 (!) 155/82 (!) 175/95 (!) 162/90  Pulse: 70 72 79 80  Resp: 18 20 18 19   Temp: 98.2 F (36.8 C) 98.5 F (36.9 C) 98.7 F (37.1 C) 97.9 F (36.6 C)  TempSrc: Oral Oral    SpO2: 100% 99% 98% 98%  Weight:      Height:        Intake/Output Summary (Last 24 hours) at 12/08/2022 1233 Last data filed at  12/07/2022 1500 Gross per 24 hour  Intake 0 ml  Output --  Net 0 ml   Filed Weights   12/07/22 0215  Weight: 54.5 kg    Examination:  General exam: Overall comfortable, not in distress, deconditioned, weak, thin built HEENT: PERRL Respiratory system:  no wheezes or crackles  Cardiovascular system: S1 & S2 heard, RRR.  Gastrointestinal system: Abdomen is nondistended, soft and nontender. Central nervous system: Alert and awake, obese,, oriented to place Extremities: No edema, no clubbing ,no cyanosis Skin: No rashes, no ulcers,no icterus     Data Reviewed: I have personally reviewed following labs and imaging studies  CBC: Recent Labs  Lab 12/06/22 1955 12/08/22 0317  WBC 7.3 6.9  NEUTROABS 5.2  --   HGB 11.5* 12.2  HCT 33.8* 35.3*  MCV 90.4 89.1  PLT 283 [id number]   Basic Metabolic Panel: Recent Labs  Lab 12/06/22 1955 12/07/22 0328 12/07/22 1438 12/08/22 0317  NA 132* 134*  --  131*  K 2.6* 2.6* 3.7 2.9*  CL 99 101  --  98  CO2 24 23  --  21*  GLUCOSE 104* 109*  --  91  BUN 12 12  --  8  CREATININE 0.51 0.48  --  0.41*  CALCIUM 8.5* 8.5*  --  8.4*  MG 1.8 1.7  --  1.8     Recent Results (from the past 240 hour(s))  Resp panel by RT-PCR (RSV, Flu A&B, Covid) Anterior Nasal Swab     Status: None   Collection Time: 12/07/22  1:10 AM   Specimen: Anterior Nasal Swab  Result Value Ref Range Status   SARS Coronavirus 2 by RT PCR NEGATIVE NEGATIVE Final    Comment: (NOTE) SARS-CoV-2 target nucleic acids are NOT DETECTED.  The SARS-CoV-2 RNA is generally detectable in upper respiratory specimens during the acute phase of infection. The lowest concentration of SARS-CoV-2 viral copies this assay can detect is 138 copies/mL. A negative result does not preclude SARS-Cov-2 infection and should not be used as the sole basis for treatment or other patient management decisions. A negative result may occur with  improper specimen collection/handling, submission of  specimen other than nasopharyngeal swab, presence of viral mutation(s) within the areas targeted by this assay, and inadequate number of viral copies(<138 copies/mL). A negative result must be combined with clinical observations, patient history, and epidemiological information. The expected result is Negative.  Fact Sheet for Patients:  EntrepreneurPulse.com.au  Fact Sheet for Healthcare Providers:  IncredibleEmployment.be  This test is no t yet approved or cleared by the [place] FDA and  has been authorized for detection and/or diagnosis of SARS-CoV-2 by FDA under an Emergency Use Authorization (EUA). This EUA will remain  in effect (meaning this test can be used) for the duration of the COVID-19 declaration under Section 564(b)(1) of the Act, 21 U.S.C.section 360bbb-3(b)(1), unless the authorization is terminated  or revoked sooner.       Influenza A by PCR NEGATIVE  NEGATIVE Final   Influenza B by PCR NEGATIVE NEGATIVE Final    Comment: (NOTE) The Xpert Xpress SARS-CoV-2/FLU/RSV plus assay is intended as an aid in the diagnosis of influenza from Nasopharyngeal swab specimens and should not be used as a sole basis for treatment. Nasal washings and aspirates are unacceptable for Xpert Xpress SARS-CoV-2/FLU/RSV testing.  Fact Sheet for Patients: EntrepreneurPulse.com.au  Fact Sheet for Healthcare Providers: IncredibleEmployment.be  This test is not yet approved or cleared by the [place] FDA and has been authorized for detection and/or diagnosis of SARS-CoV-2 by FDA under an Emergency Use Authorization (EUA). This EUA will remain in effect (meaning this test can be used) for the duration of the COVID-19 declaration under Section 564(b)(1) of the Act, 21 U.S.C. section 360bbb-3(b)(1), unless the authorization is terminated or revoked.     Resp Syncytial Virus by PCR NEGATIVE NEGATIVE Final     Comment: (NOTE) Fact Sheet for Patients: EntrepreneurPulse.com.au  Fact Sheet for Healthcare Providers: IncredibleEmployment.be  This test is not yet approved or cleared by the [place] FDA and has been authorized for detection and/or diagnosis of SARS-CoV-2 by FDA under an Emergency Use Authorization (EUA). This EUA will remain in effect (meaning this test can be used) for the duration of the COVID-19 declaration under Section 564(b)(1) of the Act, 21 U.S.C. section 360bbb-3(b)(1), unless the authorization is terminated or revoked.  Performed at [facility], [place] [address], [place], [place] [address]   C Difficile Quick Screen w PCR reflex     Status: Abnormal   Collection Time: 12/07/22  2:46 AM   Specimen: STOOL  Result Value Ref Range Status   C Diff antigen POSITIVE (A) NEGATIVE Final   C Diff toxin NEGATIVE NEGATIVE Final   C Diff interpretation Results are indeterminate. See PCR results.  Final    Comment: Performed at [facility], [place] [address], [place], [place] [address]  C. Diff by PCR, Reflexed     Status: Abnormal   Collection Time: 12/07/22  2:46 AM  Result Value Ref Range Status   Toxigenic C. Difficile by PCR POSITIVE (A) NEGATIVE Final    Comment: Positive for toxigenic C. difficile with little to no toxin production. Only treat if clinical presentation suggests symptomatic illness. Performed at [facility], [place] [address], [place], [place] [address]      Radiology Studies: DG Abd Portable 2V  Result Date: 12/07/2022 CLINICAL DATA:  Incontinence EXAM: PORTABLE ABDOMEN - 2 VIEW COMPARISON:  11/15/2022 FINDINGS: Scattered large and small bowel gas is noted. No obstructive changes are seen. Degenerative changes of lumbar spine are noted. Bilateral hip replacements are seen. IMPRESSION: No acute abnormality noted. Electronically Signed   By: Inez Catalina M.D.   On: 12/07/2022  01:03   CT Head Wo Contrast  Result Date: 12/06/2022 CLINICAL DATA:  Mental status change, unknown cause EXAM: CT HEAD WITHOUT CONTRAST TECHNIQUE: Contiguous axial images were obtained from the base of the skull through the vertex without intravenous contrast. RADIATION DOSE REDUCTION: This exam was performed according to the departmental dose-optimization program which includes automated exposure control, adjustment of the mA and/or kV according to patient size and/or use of iterative reconstruction technique. COMPARISON:  MRI head 11/16/2022 FINDINGS: Brain: Cerebral ventricle sizes are concordant with the degree of cerebral volume loss. Patchy and confluent areas of decreased attenuation are noted throughout the deep and periventricular white matter of the cerebral hemispheres bilaterally, compatible with chronic microvascular ischemic disease. Bilateral  chronic cerebellar infarctions. No evidence of large-territorial acute infarction. No parenchymal hemorrhage. No mass lesion. No extra-axial collection. No mass effect or midline shift. No hydrocephalus. Basilar cisterns are patent. Vascular: No hyperdense vessel. Skull: No acute fracture or focal lesion. Sinuses/Orbits: Paranasal sinuses and mastoid air cells are clear. Bilateral lens replacement. The orbits are unremarkable. Other: None. IMPRESSION: No acute intracranial abnormality. Electronically Signed   By: Iven Finn M.D.   On: 12/06/2022 19:42   DG Chest Port 1 View  Result Date: 12/06/2022 CLINICAL DATA:  Shortness of breath, anorexia, and altered mental status for 4 days EXAM: PORTABLE CHEST 1 VIEW COMPARISON:  Portable exam 1839 hours compared to 11/15/2022 FINDINGS: Normal heart size with mitral annular calcification. Mediastinal contours and pulmonary vascularity normal. Atherosclerotic calcification aorta. Postsurgical changes LEFT lung with mild volume loss LEFT chest. No pulmonary infiltrate, pleural effusion, or pneumothorax. Bones  demineralized. Postsurgical changes of RIGHT mastectomy and axillary node dissection. IMPRESSION: No acute abnormalities. Aortic Atherosclerosis (ICD10-I70.0). Electronically Signed   By: Lavonia Dana M.D.   On: 12/06/2022 19:42    Scheduled Meds:  amLODipine  10 mg Oral Daily   aspirin EC  81 mg Oral Daily   clopidogrel  75 mg Oral Daily   enoxaparin (LOVENOX) injection  40 mg Subcutaneous Q24H   losartan  50 mg Oral Daily   nystatin ointment   Topical BID   rosuvastatin  10 mg Oral Daily   vancomycin  125 mg Oral QID   Continuous Infusions:  lactated ringers 75 mL/hr at 12/08/22 1222   magnesium sulfate bolus IVPB       LOS: 1 day   Shelly Coss, MD [facility] P3/25/2024, 12:33 PM

## 2022-12-08 NOTE — Progress Notes (Addendum)
Patient incontinent of stool X5 with total bed changes each time.   Patient has refused to eat for the last two days. The only water she has drank has been to take water with pills.    Dewayne Hatch, RN

## 2022-12-08 NOTE — NC FL2 (Signed)
[place] LEVEL OF CARE FORM     IDENTIFICATION  Patient Name: [name] Birthdate: 07-07-1946 Sex: female Admission Date (Current Location): 12/06/2022  [facility] and [place] Number:  Herbalist and Address:  [facility],  [place], [place]      Provider Number: [id number]  Attending Physician Name and Address:  Shelly Coss, MD  Relative Name and Phone Number:  Felecia Nard (spouse) Ph: [phone]    Current Level of Care: Hospital Recommended Level of Care: [place] Prior Approval Number:    Date Approved/Denied:   PASRR Number: [id number]  Discharge Plan: SNF    Current Diagnoses: Patient Active Problem List   Diagnosis Date Noted   AMS (altered mental status) 12/07/2022   History of CVA (cerebrovascular accident) 12/07/2022   Candidiasis of female genitalia 99991111   Metabolic encephalopathy 99991111   Acute gastroenteropathy due to Norovirus 11/18/2022   Acute encephalopathy 11/16/2022   Acute ischemic stroke ([place]) 09/06/2022   Protein-calorie malnutrition, severe 0000000   Acute metabolic encephalopathy [id number]   Prediabetes 08/08/2022   Falls, initial encounter 08/08/2022   C. 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   Back pain 02/21/2022   Cardiovascular symptoms 02/21/2022   Contusion of abdominal wall 02/21/2022   Cough 02/21/2022   Decreased estrogen level 02/21/2022   Diarrhea 02/21/2022   Disorder of breast 02/21/2022   Encounter for screening mammogram for malignant neoplasm of breast 02/21/2022   Former smoker 02/21/2022   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   Hardening of the aorta (main artery of the heart)  ([place]) 02/21/2022   Iron deficiency anemia due to chronic blood loss 02/21/2022   Kyphosis 02/21/2022   Malignant neoplasm of female breast ([place]) 02/21/2022   Neck pain 02/21/2022   Orthopnea 02/21/2022   Other long term (current) drug therapy 02/21/2022   Pedal edema 02/21/2022   Proteinuria 02/21/2022   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   Smoker 02/21/2022   Standard chest x-ray abnormal 02/21/2022   Temperature elevated 02/21/2022   DM2 (diabetes mellitus, type 2) ([place]) 02/21/2022   Varicose veins of lower extremity with inflammation 02/21/2022   Acute postoperative pain 11/19/2021   Chronic prescription opiate use 11/19/2021   S/P flap graft 11/19/2021   Raynaud's disease 05/28/2021   Acquired trigger finger of right ring finger 05/27/2021   Pain in right hand 05/27/2021   Closed fracture of shaft of right femur ([place]) 06/13/2019   Cervical radiculopathy 03/16/2019   Gastrostomy tube dysfunction ([place]) 06/25/2018   Impingement syndrome of left shoulder region 05/24/2018   Impingement syndrome of right shoulder region 05/24/2018   Osteoarthritis 03/01/2018   Fracture of multiple ribs of left side 11/29/2017   History of breast cancer 11/29/2017   Pressure injury of sacral region, stage 1 11/29/2017   Pathological fracture of pelvis due to age-related osteoporosis 11/22/2017   Syncope and collapse 11/22/2017   Empyema with fistula ([place]) 11/11/2017   Chronic pain 09/20/2017   CAD (coronary artery disease) 01/26/2017   History of atrial fibrillation 01/26/2017   Tobacco consumption 02/12/2016   S/P right TH revision 10/22/2015   S/P revision of total hip 10/22/2015   Cholecystitis    Esophageal fistula    Abscess  Discitis    HCAP (healthcare-associated pneumonia)    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   Abscess of neck    Esophageal perforation    Loss of weight    Esophageal dysphagia    AKI (acute kidney injury) ([place])    Pneumonia     Respiratory failure ([place])    Hypokalemia 08/26/2014   Hyponatremia 08/26/2014   COPD with acute exacerbation (HCC)    Tobacco dependence    Hyperlipidemia    Essential hypertension    CAD S/P percutaneous coronary angioplasty     Orientation RESPIRATION BLADDER Height & Weight     Self  Normal Incontinent Weight: 120 lb 2.4 oz (54.5 kg) Height:  5\' 2"  (157.5 cm)  BEHAVIORAL SYMPTOMS/MOOD NEUROLOGICAL BOWEL NUTRITION STATUS     (N/A) Incontinent Diet (Carb modified diet)  AMBULATORY STATUS COMMUNICATION OF NEEDS Skin   Extensive Assist Verbally Other (Comment) (Ecchymosis: left thigh; Erythema: bilateral buttocks, perineum; Rash: perineum)                       Personal Care Assistance Level of Assistance  Bathing, Feeding, Dressing Bathing Assistance: Maximum assistance Feeding assistance: Limited assistance Dressing Assistance: Maximum assistance     Functional Limitations Info  Sight, Hearing, Speech Sight Info: Impaired Hearing Info: Adequate Speech Info: Adequate    SPECIAL CARE FACTORS FREQUENCY  PT (By licensed PT), OT (By licensed OT)     PT Frequency: 5x's/week OT Frequency: 5x's/week            Contractures Contractures Info: Not present    Additional Factors Info  Code Status, Allergies, Isolation Precautions Code Status Info: Full Allergies Info: Cefuroxime, Cephalexin, Chlorhexidine, Prednisone, Zithromax (Azithromycin Dihydrate)     Isolation Precautions Info: Enteric precautions     Current Medications (12/08/2022):  This is the current hospital active medication list Current Facility-Administered Medications  Medication Dose Route Frequency Provider Last Rate Last Admin   amLODipine (NORVASC) tablet 10 mg  10 mg Oral Daily Shelly Coss, MD   10 mg at 12/08/22 0945   aspirin EC tablet 81 mg  81 mg Oral Daily Tu, Ching T, DO   81 mg at 12/08/22 0945   clopidogrel (PLAVIX) tablet 75 mg  75 mg Oral Daily Tu, Ching T, DO   75 mg at  12/08/22 0945   enoxaparin (LOVENOX) injection 40 mg  40 mg Subcutaneous Q24H Tu, Ching T, DO   40 mg at 12/08/22 0945   labetalol (NORMODYNE) injection 10 mg  10 mg Intravenous Q2H PRN Shelly Coss, MD       lactated ringers infusion   Intravenous Continuous Shelly Coss, MD 75 mL/hr at 12/07/22 1957 New Bag at 12/07/22 1957   losartan (COZAAR) tablet 50 mg  50 mg Oral Daily Tu, Ching T, DO   50 mg at 12/08/22 0945   nystatin ointment (MYCOSTATIN)   Topical BID Tu, Ching T, DO   Given at 12/08/22 1113   potassium chloride 10 mEq in 100 mL IVPB  10 mEq Intravenous Q1 Hr x 6 Kathryne Eriksson, NP 100 mL/hr at 12/08/22 1112 10 mEq at 12/08/22 1112   rosuvastatin (CRESTOR) tablet 10 mg  10 mg Oral Daily Tu, Ching T, DO   10 mg at 12/08/22 0945   vancomycin (VANCOCIN) capsule 125 mg  125 mg Oral QID Shelly Coss, MD   125 mg at 12/08/22 1113     Discharge Medications: Please see discharge summary for a list  of discharge medications.  Relevant Imaging Results:  Relevant Lab Results:   Additional Information SSN: [id number]  Sherie Don, LCSW

## 2022-12-09 ENCOUNTER — Encounter (HOSPITAL_COMMUNITY): Payer: Self-pay | Admitting: Internal Medicine

## 2022-12-09 DIAGNOSIS — G9341 Metabolic encephalopathy: Secondary | ICD-10-CM | POA: Diagnosis not present

## 2022-12-09 LAB — MAGNESIUM: Magnesium: 2.1 mg/dL (ref 1.7–2.4)

## 2022-12-09 LAB — BASIC METABOLIC PANEL
Anion gap: 11 (ref 5–15)
BUN: 8 mg/dL (ref 8–23)
CO2: 18 mmol/L — ABNORMAL LOW (ref 22–32)
Calcium: 8.6 mg/dL — ABNORMAL LOW (ref 8.9–10.3)
Chloride: 98 mmol/L (ref 98–111)
Creatinine, Ser: 0.5 mg/dL (ref 0.44–1.00)
GFR, Estimated: >60 mL/min (ref >60)
Glucose, Bld: 81 mg/dL (ref 70–99)
Potassium: 3.7 mmol/L (ref 3.5–5.1)
Sodium: 127 mmol/L — ABNORMAL LOW (ref 135–145)

## 2022-12-09 MED ORDER — OXYCODONE HCL 5 MG PO TABS
5.0000 mg | ORAL_TABLET | Freq: Four times a day (QID) | ORAL | Status: DC | PRN
  Administered 2022-12-09 – 2022-12-10 (×3): 5 mg via ORAL
  Filled 2022-12-09 (×3): qty 1

## 2022-12-09 MED ORDER — SODIUM CHLORIDE 0.9 % IV SOLN: INTRAVENOUS | Status: DC

## 2022-12-09 NOTE — Plan of Care (Signed)

## 2022-12-09 NOTE — Plan of Care (Signed)
Patient alert and oriented x 1, impulsive, attempting to get out of bed frequently. X 1 assist to bedside commode. IVF infusing.  Bed alarm on.  Problem: Education: Goal: Knowledge of disease or condition will improve Outcome: Progressing Goal: Knowledge of secondary prevention will improve (MUST DOCUMENT ALL) Outcome: Progressing Goal: Knowledge of patient specific risk factors will improve Elta [place] N/A or DELETE if not current risk factor) Outcome: Progressing   Problem: Ischemic Stroke/TIA Tissue Perfusion: Goal: Complications of ischemic stroke/TIA will be minimized Outcome: Progressing   Problem: Coping: Goal: Will verbalize positive feelings about self Outcome: Progressing Goal: Will identify appropriate support needs Outcome: Progressing   Problem: Health Behavior/Discharge Planning: Goal: Ability to manage health-related needs will improve Outcome: Progressing Goal: Goals will be collaboratively established with patient/family Outcome: Progressing   Problem: Self-Care: Goal: Ability to participate in self-care as condition permits will improve Outcome: Progressing Goal: Verbalization of feelings and concerns over difficulty with self-care will improve Outcome: Progressing Goal: Ability to communicate needs accurately will improve Outcome: Progressing   Problem: Nutrition: Goal: Risk of aspiration will decrease Outcome: Progressing Goal: Dietary intake will improve Outcome: Progressing   Problem: Education: Goal: Knowledge of General Education information will improve Description: Including pain rating scale, medication(s)/side effects and non-pharmacologic comfort measures Outcome: Progressing   Problem: Health Behavior/Discharge Planning: Goal: Ability to manage health-related needs will improve Outcome: Progressing   Problem: Clinical Measurements: Goal: Ability to maintain clinical measurements within normal limits will improve Outcome: Progressing Goal:  Will remain free from infection Outcome: Progressing Goal: Diagnostic test results will improve Outcome: Progressing Goal: Respiratory complications will improve Outcome: Progressing Goal: Cardiovascular complication will be avoided Outcome: Progressing   Problem: Activity: Goal: Risk for activity intolerance will decrease Outcome: Progressing   Problem: Nutrition: Goal: Adequate nutrition will be maintained Outcome: Progressing   Problem: Coping: Goal: Level of anxiety will decrease Outcome: Progressing   Problem: Elimination: Goal: Will not experience complications related to bowel motility Outcome: Progressing Goal: Will not experience complications related to urinary retention Outcome: Progressing   Problem: Pain Managment: Goal: General experience of comfort will improve Outcome: Progressing   Problem: Safety: Goal: Ability to remain free from injury will improve Outcome: Progressing   Problem: Skin Integrity: Goal: Risk for impaired skin integrity will decrease Outcome: Progressing

## 2022-12-09 NOTE — Progress Notes (Signed)
Chaplain responded to [facility], attempted to complete consult but pt seemed very tired and didn't engage much today. Needs follow up.   [place], [place]   12/09/22 1700  Spiritual Encounters  Type of Visit Initial  Care provided to: Patient  Conversation partners present during encounter Nurse  Referral source Nurse (RN/NT/LPN)  Reason for visit Routine spiritual support  Spiritual Framework  Presenting Themes Impactful experiences and emotions  Patient Stress Factors Health changes  Interventions  Spiritual Care Interventions Made Established relationship of care and support;Compassionate presence  Intervention Outcomes  Outcomes Connection to spiritual care;Awareness of support  Spiritual Care Plan  Spiritual Care Issues Still Outstanding Referring to oncoming chaplain for further support

## 2022-12-09 NOTE — TOC Progression Note (Addendum)
Transition of Care [facility]) - Progression Note   Patient Details  Name: [name] MRN: [id number] Date of Birth: [birth year]  Transition of Care [facility]) CM/SW [place], LCSW Phone Number: 12/09/2022, 2:41 PM  Clinical Narrative: Patient has been approved for SNF. Reference ID # is: [id number]. Patient has been approved for 12/09/2022-12/11/2022.  Addendum: CSW spoke with patient's husband as he was asking about [facility], but the facility did not not make a bed offer so he is agreeable to continuing with [facility].  Expected Discharge Plan: [place] Barriers to Discharge: Continued Medical Work up  Expected Discharge Plan and Services In-house Referral: Clinical Social Work Post Acute Care Choice: [place] Living arrangements for the past 2 months: Single Family Home             DME Arranged: N/A DME Agency: NA  Social Determinants of Health (SDOH) Interventions SDOH Screenings   Food Insecurity: No Food Insecurity (11/16/2022)  Housing: Low Risk  (11/16/2022)  Transportation Needs: No Transportation Needs (11/16/2022)  Utilities: Not At Risk (11/16/2022)  Tobacco Use: High Risk (11/16/2022)   Readmission Risk Interventions    12/08/2022   12:02 PM 11/02/2022   12:01 PM  Readmission Risk Prevention Plan  Transportation Screening Complete Complete  PCP or Specialist Appt within 5-7 Days  Complete  Home Care Screening  Complete  Medication Review (RN CM)  Complete  HRI or Home Care Consult Complete   Social Work Consult for [place] Planning/Counseling Complete   Palliative Care Screening Not Applicable   Medication Review Press photographer) Complete

## 2022-12-09 NOTE — Progress Notes (Signed)
PROGRESS NOTE  [name]  [id number] DOB: 03-22-1946 DOA: 12/06/2022 PCP: [name], FNP   Brief Narrative: Patient is a 5 female with history of coronary artery disease, status post PCI, hypertension, CVA, COPD, type 2 diabetes, C. difficile, chronic pain syndrome on opiates who presented from home with altered mentation, weakness.  History was taken from husband.  As per the report, she was more lethargic and weak over the last few days.  She has been admitted numerous times in the past for altered mental status and AKI thought to be from polypharmacy including opiates.  The last one was in February this year.  She follows with pain management Dr. Nelva Bush.  On presentation, she was  hypertensive otherwise hemodynamically stable.  Lab work showed potassium of 2.6.  CT head was negative for acute findings.  Urinalysis not suspicious for UTI.  Patient was admitted for further management.  Patient noted to have diarrhea, C. difficile antigen/PCR came out to be positive with negative toxin but since she had persistent diarrhea, started on oral vancomycin with improvement.  PT recommended SNF.  TOC consulted and following, she might have a bed at SNF tomorrow  Assessment & Plan:  Principal Problem:   Acute metabolic encephalopathy Active Problems:   Chronic pain   Hyperlipidemia   Essential hypertension   Hypokalemia   History of CVA (cerebrovascular accident)   Candidiasis of female genitalia   Metabolic encephalopathy  Acute metabolic encephalopathy on the background of dementia: Thought to be secondary to polypharmacy.  Numerous admissions in the past with the same.  Follows with pain management.  Recently her provider had discontinued oxycodone and put her on morphine.  She is also on gabapentin.  Continue to monitor. Mental status has  improved , but still intermittently confused, oriented to place.Called and discussed with husband who states that this is chronic problem, she is  forgetful and has memory issues.   Positive C. 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  C. difficile antigen/PCR positive with negative toxin.  no leukocytosis or AKI.   Abdominal x-ray did not show any acute findings. But patient has frequent diarrhea so started on 10-day course of vancomycin .  Diarrhea better.   Hyponatremia: This could be from hypovolemic hyponatremia  from diarrhea.  Started on gentle normal saline.  Check BMP tomorrow  chronic pain syndrome: On gabapentin, MS Contin at home  Currently on hold due to encephalopathy   Hypertension: Blood pressure was elevated.  Continue losartan with increased dose.  Added amlodipine.  Continue as needed meds for severe hypertension   Hyperlipidemia: On statin   Candidiasis of female genitalia: Suspected to have fungal infection around genitalia and gluteal fold.  Nystatin cream ordered   History of CVA: On aspirin, Plavix   Hypokalemia: Potassium and magnesium supplemented, normalized today        DVT prophylaxis:enoxaparin (LOVENOX) injection 40 mg Start: 12/07/22 1000     Code Status: Full Code  Family Communication: Called and discussed with husband on phone on 3/26  Patient status:Inpatient  Patient is from :home  Anticipated discharge to:SNF  Estimated DC date: Likely tomorrow   Consultants: None  Procedures:None  Antimicrobials:  Anti-infectives (From admission, onward)    Start     Dose/Rate Route Frequency Ordered Stop   12/08/22 1200  vancomycin (VANCOCIN) capsule 125 mg        125 mg Oral 4 times daily 12/08/22 1045 12/18/22 1359  Subjective: Patient seen and examined at bedside today.  Hemodynamically stable.  Lying in the bed.  As per the RN, diarrhea has stopped.  She denies any abdominal pain, nausea or vomiting today.  She is oriented to place but still confused to time.  Not agitated.  Objective: Vitals:   12/08/22 1349 12/08/22 1926 12/09/22 0551 12/09/22 1242   BP: 137/80 (!) 167/84 (!) 178/83 (!) 141/83  Pulse: 91 77 89 79  Resp: 14 19 17 16   Temp: 98.7 F (37.1 C) 97.9 F (36.6 C) 97.8 F (36.6 C) 98.6 F (37 C)  TempSrc: Oral Oral Oral Oral  SpO2: 100% 100% 100% 100%  Weight:      Height:        Intake/Output Summary (Last 24 hours) at 12/09/2022 1316 Last data filed at 12/09/2022 1200 Gross per 24 hour  Intake 2337.44 ml  Output --  Net 2337.44 ml   Filed Weights   12/07/22 0215  Weight: 54.5 kg    Examination:   General exam: Overall comfortable, not in distress, thin built, weak, deconditioned HEENT: PERRL Respiratory system:  no wheezes or crackles  Cardiovascular system: S1 & S2 heard, RRR.  Gastrointestinal system: Abdomen is nondistended, soft and nontender. Central nervous system: Alert and awake, obeys commands, oriented to place Extremities: No edema, no clubbing ,no cyanosis Skin: No rashes, no ulcers,no icterus      Data Reviewed: I have personally reviewed following labs and imaging studies  CBC: Recent Labs  Lab 12/06/22 1955 12/08/22 0317  WBC 7.3 6.9  NEUTROABS 5.2  --   HGB 11.5* 12.2  HCT 33.8* 35.3*  MCV 90.4 89.1  PLT 283 [id number]   Basic Metabolic Panel: Recent Labs  Lab 12/06/22 1955 12/07/22 0328 12/07/22 1438 12/08/22 0317 12/08/22 1402 12/09/22 0358  NA 132* 134*  --  131*  --  127*  K 2.6* 2.6* 3.7 2.9* 3.4* 3.7  CL 99 101  --  98  --  98  CO2 24 23  --  21*  --  18*  GLUCOSE 104* 109*  --  91  --  81  BUN 12 12  --  8  --  8  CREATININE 0.51 0.48  --  0.41*  --  0.50  CALCIUM 8.5* 8.5*  --  8.4*  --  8.6*  MG 1.8 1.7  --  1.8  --  2.1     Recent Results (from the past 240 hour(s))  Resp panel by RT-PCR (RSV, Flu A&B, Covid) Anterior Nasal Swab     Status: None   Collection Time: 12/07/22  1:10 AM   Specimen: Anterior Nasal Swab  Result Value Ref Range Status   SARS Coronavirus 2 by RT PCR NEGATIVE NEGATIVE Final    Comment: (NOTE) SARS-CoV-2 target nucleic acids are  NOT DETECTED.  The SARS-CoV-2 RNA is generally detectable in upper respiratory specimens during the acute phase of infection. The lowest concentration of SARS-CoV-2 viral copies this assay can detect is 138 copies/mL. A negative result does not preclude SARS-Cov-2 infection and should not be used as the sole basis for treatment or other patient management decisions. A negative result may occur with  improper specimen collection/handling, submission of specimen other than nasopharyngeal swab, presence of viral mutation(s) within the areas targeted by this assay, and inadequate number of viral copies(<138 copies/mL). A negative result must be combined with clinical observations, patient history, and epidemiological information. The expected result is Negative.  Fact Sheet for Patients:  EntrepreneurPulse.com.au  Fact Sheet for Healthcare Providers:  IncredibleEmployment.be  This test is no t yet approved or cleared by the [place] FDA and  has been authorized for detection and/or diagnosis of SARS-CoV-2 by FDA under an Emergency Use Authorization (EUA). This EUA will remain  in effect (meaning this test can be used) for the duration of the COVID-19 declaration under Section 564(b)(1) of the Act, 21 U.S.C.section 360bbb-3(b)(1), unless the authorization is terminated  or revoked sooner.       Influenza A by PCR NEGATIVE NEGATIVE Final   Influenza B by PCR NEGATIVE NEGATIVE Final    Comment: (NOTE) The Xpert Xpress SARS-CoV-2/FLU/RSV plus assay is intended as an aid in the diagnosis of influenza from Nasopharyngeal swab specimens and should not be used as a sole basis for treatment. Nasal washings and aspirates are unacceptable for Xpert Xpress SARS-CoV-2/FLU/RSV testing.  Fact Sheet for Patients: EntrepreneurPulse.com.au  Fact Sheet for Healthcare Providers: IncredibleEmployment.be  This test is not  yet approved or cleared by the [place] FDA and has been authorized for detection and/or diagnosis of SARS-CoV-2 by FDA under an Emergency Use Authorization (EUA). This EUA will remain in effect (meaning this test can be used) for the duration of the COVID-19 declaration under Section 564(b)(1) of the Act, 21 U.S.C. section 360bbb-3(b)(1), unless the authorization is terminated or revoked.     Resp Syncytial Virus by PCR NEGATIVE NEGATIVE Final    Comment: (NOTE) Fact Sheet for Patients: EntrepreneurPulse.com.au  Fact Sheet for Healthcare Providers: IncredibleEmployment.be  This test is not yet approved or cleared by the [place] FDA and has been authorized for detection and/or diagnosis of SARS-CoV-2 by FDA under an Emergency Use Authorization (EUA). This EUA will remain in effect (meaning this test can be used) for the duration of the COVID-19 declaration under Section 564(b)(1) of the Act, 21 U.S.C. section 360bbb-3(b)(1), unless the authorization is terminated or revoked.  Performed at [facility], [place] [address] [place], [place] [address]   C Difficile Quick Screen w PCR reflex     Status: Abnormal   Collection Time: 12/07/22  2:46 AM   Specimen: STOOL  Result Value Ref Range Status   C Diff antigen POSITIVE (A) NEGATIVE Final   C Diff toxin NEGATIVE NEGATIVE Final   C Diff interpretation Results are indeterminate. See PCR results.  Final    Comment: Performed at [facility], [place] [address] [place], [place] [address]  C. Diff by PCR, Reflexed     Status: Abnormal   Collection Time: 12/07/22  2:46 AM  Result Value Ref Range Status   Toxigenic C. Difficile by PCR POSITIVE (A) NEGATIVE Final    Comment: Positive for toxigenic C. difficile with little to no toxin production. Only treat if clinical presentation suggests symptomatic illness. Performed at [facility], [place] [address] [place], [place] [address]      Radiology Studies: No results found.  Scheduled Meds:  amLODipine  10 mg Oral Daily   aspirin EC  81 mg Oral Daily   clopidogrel  75 mg Oral Daily   enoxaparin (LOVENOX) injection  40 mg Subcutaneous Q24H   losartan  100 mg Oral Daily   nystatin ointment   Topical BID   rosuvastatin  10 mg Oral Daily   vancomycin  125 mg Oral QID   Continuous Infusions:  sodium chloride 75 mL/hr at 12/09/22 1050     LOS: 2 days   Shelly Coss, MD  [facility] P3/26/2024, 1:16 PM

## 2022-12-09 NOTE — TOC Progression Note (Signed)
Transition of Care [facility]) - Progression Note    Patient Details  Name: [name] MRN: [id number] Date of Birth: 05-22-1946  [facility]) CM/SW Contact  Purcell Mouton, RN Phone Number: 12/09/2022, 12:53 PM  Clinical Narrative:     Insurance authorization started for [facility].   Expected Discharge Plan: Skilled Nursing Facility Barriers to Discharge: Ship broker, Continued Medical Work up, SNF Pending bed offer  Expected Discharge Plan and Services In-house Referral: Clinical Social Work   Post Acute Care Choice: [place] Living arrangements for the past 2 months: Single Family Home                 DME Arranged: N/A DME Agency: NA                   Social Determinants of Health (SDOH) Interventions SDOH Screenings   Food Insecurity: No Food Insecurity (11/16/2022)  Housing: Low Risk  (11/16/2022)  Transportation Needs: No Transportation Needs (11/16/2022)  Utilities: Not At Risk (11/16/2022)  Tobacco Use: High Risk (11/16/2022)    Readmission Risk Interventions    12/08/2022   12:02 PM 11/02/2022   12:01 PM  Readmission Risk Prevention Plan  Transportation Screening Complete Complete  PCP or Specialist Appt within 5-7 Days  Complete  Home Care Screening  Complete  Medication Review (RN CM)  Complete  HRI or Home Care Consult Complete   Social Work Consult for [place] Planning/Counseling Complete   Palliative Care Screening Not Applicable   Medication Review Press photographer) Complete

## 2022-12-09 NOTE — TOC Progression Note (Signed)
Transition of Care [facility]) - Progression Note    Patient Details  Name: [name] MRN: [id number] Date of Birth: 1945/09/26  [facility]) CM/SW Contact  Purcell Mouton, RN Phone Number: 12/09/2022, 12:39 PM  Clinical Narrative:    Bed offers given to pt's husband Kadeidra Ventress via telephone. [facility], will need insurance authorization.    Expected Discharge Plan: Skilled Nursing Facility Barriers to Discharge: Ship broker, Continued Medical Work up, SNF Pending bed offer  Expected Discharge Plan and Services In-house Referral: Clinical Social Work   Post Acute Care Choice: [place] Living arrangements for the past 2 months: Single Family Home                 DME Arranged: N/A DME Agency: NA                   Social Determinants of Health (SDOH) Interventions SDOH Screenings   Food Insecurity: No Food Insecurity (11/16/2022)  Housing: Low Risk  (11/16/2022)  Transportation Needs: No Transportation Needs (11/16/2022)  Utilities: Not At Risk (11/16/2022)  Tobacco Use: High Risk (11/16/2022)    Readmission Risk Interventions    12/08/2022   12:02 PM 11/02/2022   12:01 PM  Readmission Risk Prevention Plan  Transportation Screening Complete Complete  PCP or Specialist Appt within 5-7 Days  Complete  Home Care Screening  Complete  Medication Review (RN CM)  Complete  HRI or Home Care Consult Complete   Social Work Consult for [place] Planning/Counseling Complete   Palliative Care Screening Not Applicable   Medication Review Press photographer) Complete

## 2022-12-10 ENCOUNTER — Inpatient Hospital Stay: Payer: Medicare Other | Admitting: Neurology

## 2022-12-10 DIAGNOSIS — G9341 Metabolic encephalopathy: Secondary | ICD-10-CM | POA: Diagnosis not present

## 2022-12-10 DIAGNOSIS — I4821 Permanent atrial fibrillation: Secondary | ICD-10-CM | POA: Diagnosis not present

## 2022-12-10 DIAGNOSIS — I2510 Atherosclerotic heart disease of native coronary artery without angina pectoris: Secondary | ICD-10-CM | POA: Diagnosis not present

## 2022-12-10 DIAGNOSIS — E785 Hyperlipidemia, unspecified: Secondary | ICD-10-CM | POA: Diagnosis not present

## 2022-12-10 DIAGNOSIS — M6281 Muscle weakness (generalized): Secondary | ICD-10-CM | POA: Diagnosis not present

## 2022-12-10 DIAGNOSIS — E876 Hypokalemia: Secondary | ICD-10-CM | POA: Diagnosis not present

## 2022-12-10 DIAGNOSIS — J449 Chronic obstructive pulmonary disease, unspecified: Secondary | ICD-10-CM | POA: Diagnosis not present

## 2022-12-10 DIAGNOSIS — G894 Chronic pain syndrome: Secondary | ICD-10-CM | POA: Diagnosis not present

## 2022-12-10 DIAGNOSIS — Z7401 Bed confinement status: Secondary | ICD-10-CM | POA: Diagnosis not present

## 2022-12-10 DIAGNOSIS — I69828 Other speech and language deficits following other cerebrovascular disease: Secondary | ICD-10-CM | POA: Diagnosis not present

## 2022-12-10 DIAGNOSIS — E119 Type 2 diabetes mellitus without complications: Secondary | ICD-10-CM | POA: Diagnosis not present

## 2022-12-10 DIAGNOSIS — M1710 Unilateral primary osteoarthritis, unspecified knee: Secondary | ICD-10-CM | POA: Diagnosis not present

## 2022-12-10 DIAGNOSIS — R2689 Other abnormalities of gait and mobility: Secondary | ICD-10-CM | POA: Diagnosis not present

## 2022-12-10 DIAGNOSIS — I69398 Other sequelae of cerebral infarction: Secondary | ICD-10-CM | POA: Diagnosis not present

## 2022-12-10 DIAGNOSIS — R404 Transient alteration of awareness: Secondary | ICD-10-CM | POA: Diagnosis not present

## 2022-12-10 DIAGNOSIS — J45909 Unspecified asthma, uncomplicated: Secondary | ICD-10-CM | POA: Diagnosis not present

## 2022-12-10 DIAGNOSIS — M1990 Unspecified osteoarthritis, unspecified site: Secondary | ICD-10-CM | POA: Diagnosis not present

## 2022-12-10 LAB — BASIC METABOLIC PANEL
Anion gap: 7 (ref 5–15)
BUN: 10 mg/dL (ref 8–23)
CO2: 21 mmol/L — ABNORMAL LOW (ref 22–32)
Calcium: 8.3 mg/dL — ABNORMAL LOW (ref 8.9–10.3)
Chloride: 103 mmol/L (ref 98–111)
Creatinine, Ser: 0.44 mg/dL (ref 0.44–1.00)
GFR, Estimated: >60 mL/min (ref >60)
Glucose, Bld: 102 mg/dL — ABNORMAL HIGH (ref 70–99)
Potassium: 3.2 mmol/L — ABNORMAL LOW (ref 3.5–5.1)
Sodium: 131 mmol/L — ABNORMAL LOW (ref 135–145)

## 2022-12-10 MED ORDER — NYSTATIN 100000 UNIT/GM EX OINT: TOPICAL_OINTMENT | Freq: Two times a day (BID) | CUTANEOUS | 0 refills | Status: DC

## 2022-12-10 MED ORDER — OXYCODONE HCL 5 MG PO TABS: 5.0000 mg | ORAL_TABLET | Freq: Four times a day (QID) | ORAL | 0 refills | Status: DC | PRN

## 2022-12-10 MED ORDER — GABAPENTIN 100 MG PO CAPS: 100.0000 mg | ORAL_CAPSULE | Freq: Two times a day (BID) | ORAL | 2 refills | Status: DC

## 2022-12-10 MED ORDER — LOSARTAN POTASSIUM 100 MG PO TABS: 100.0000 mg | ORAL_TABLET | Freq: Every day | ORAL | Status: DC

## 2022-12-10 MED ORDER — POTASSIUM CHLORIDE CRYS ER 20 MEQ PO TBCR
40.0000 meq | EXTENDED_RELEASE_TABLET | Freq: Once | ORAL | Status: AC
  Administered 2022-12-10: 40 meq via ORAL
  Filled 2022-12-10: qty 2

## 2022-12-10 MED ORDER — AMLODIPINE BESYLATE 10 MG PO TABS: 10.0000 mg | ORAL_TABLET | Freq: Every day | ORAL | 0 refills | Status: DC

## 2022-12-10 MED ORDER — VANCOMYCIN HCL 125 MG PO CAPS: 125.0000 mg | ORAL_CAPSULE | Freq: Four times a day (QID) | ORAL | 0 refills | Status: AC

## 2022-12-10 MED ORDER — POTASSIUM CHLORIDE CRYS ER 20 MEQ PO TBCR: 40.0000 meq | EXTENDED_RELEASE_TABLET | Freq: Every day | ORAL | 0 refills | Status: DC

## 2022-12-10 MED ORDER — MELATONIN 3 MG PO TABS
3.0000 mg | ORAL_TABLET | Freq: Once | ORAL | Status: AC
  Administered 2022-12-10: 3 mg via ORAL
  Filled 2022-12-10: qty 1

## 2022-12-10 NOTE — Progress Notes (Signed)
Chaplain engaged in an initial visit with [name]. [name] had just visited with her pastor and had received spiritual care support. Chaplain offered support and made her aware of Chaplain presence as needed. [name] seemed to be in a good space and while she desires to go home, she noted good things about the rehab facility she is going to.   Bea Graff, MDiv  12/10/22 1300  Spiritual Encounters  Type of Visit Initial  Care provided to: Patient

## 2022-12-10 NOTE — Plan of Care (Signed)
  Problem: Education: Goal: Knowledge of disease or condition will improve Outcome: Adequate for Discharge Goal: Knowledge of secondary prevention will improve (MUST DOCUMENT ALL) Outcome: Adequate for Discharge Goal: Knowledge of patient specific risk factors will improve (Mark N/A or DELETE if not current risk factor) Outcome: Adequate for Discharge   Problem: Ischemic Stroke/TIA Tissue Perfusion: Goal: Complications of ischemic stroke/TIA will be minimized Outcome: Adequate for Discharge   Problem: Coping: Goal: Will verbalize positive feelings about self Outcome: Adequate for Discharge Goal: Will identify appropriate support needs Outcome: Adequate for Discharge   Problem: Health Behavior/Discharge Planning: Goal: Ability to manage health-related needs will improve Outcome: Adequate for Discharge Goal: Goals will be collaboratively established with patient/family Outcome: Adequate for Discharge   Problem: Self-Care: Goal: Ability to participate in self-care as condition permits will improve Outcome: Adequate for Discharge Goal: Verbalization of feelings and concerns over difficulty with self-care will improve Outcome: Adequate for Discharge Goal: Ability to communicate needs accurately will improve Outcome: Adequate for Discharge   Problem: Nutrition: Goal: Risk of aspiration will decrease Outcome: Adequate for Discharge Goal: Dietary intake will improve Outcome: Adequate for Discharge   Problem: Education: Goal: Knowledge of General Education information will improve Description: Including pain rating scale, medication(s)/side effects and non-pharmacologic comfort measures Outcome: Adequate for Discharge   Problem: Health Behavior/Discharge Planning: Goal: Ability to manage health-related needs will improve Outcome: Adequate for Discharge   Problem: Clinical Measurements: Goal: Ability to maintain clinical measurements within normal limits will improve Outcome:  Adequate for Discharge Goal: Will remain free from infection Outcome: Adequate for Discharge Goal: Diagnostic test results will improve Outcome: Adequate for Discharge Goal: Respiratory complications will improve Outcome: Adequate for Discharge Goal: Cardiovascular complication will be avoided Outcome: Adequate for Discharge   Problem: Activity: Goal: Risk for activity intolerance will decrease Outcome: Adequate for Discharge   Problem: Nutrition: Goal: Adequate nutrition will be maintained Outcome: Adequate for Discharge   Problem: Coping: Goal: Level of anxiety will decrease Outcome: Adequate for Discharge   Problem: Elimination: Goal: Will not experience complications related to bowel motility Outcome: Adequate for Discharge Goal: Will not experience complications related to urinary retention Outcome: Adequate for Discharge   Problem: Pain Managment: Goal: General experience of comfort will improve Outcome: Adequate for Discharge   Problem: Safety: Goal: Ability to remain free from injury will improve Outcome: Adequate for Discharge   Problem: Skin Integrity: Goal: Risk for impaired skin integrity will decrease Outcome: Adequate for Discharge   

## 2022-12-10 NOTE — Progress Notes (Signed)
Updated Husband Gwyndolyn Saxon of Discharge

## 2022-12-10 NOTE — Discharge Summary (Signed)
Physician Discharge Summary  [name] [id number] DOB: [birth year] DOA: 12/06/2022  PCP: [name], FNP  Admit date: 12/06/2022 Discharge date: 12/10/2022  Admitted From: Home Disposition:  SNF  Discharge Condition:Stable CODE STATUS:FULL Diet recommendation:  Carb Modified    Brief/Interim Summary:  Patient is a 19 female with history of coronary artery disease, status post PCI, hypertension, CVA, COPD, type 2 diabetes, C. difficile, chronic pain syndrome on opiates who presented from home with altered mentation, weakness.  History was taken from husband.  As per the report, she was more lethargic and weak over the last few days.  She has been admitted numerous times in the past for altered mental status and AKI thought to be from polypharmacy including opiates.  The last one was in February this year.  She follows with pain management Dr. Nelva Bush.  On presentation, she was  hypertensive otherwise hemodynamically stable.  Lab work showed potassium of 2.6.  CT head was negative for acute findings.  Urinalysis not suspicious for UTI.  Patient was admitted for further management.  Patient noted to have diarrhea, C. difficile antigen/PCR came out to be positive with negative toxin but since she had persistent diarrhea, started on oral vancomycin with improvement.  PT recommended SNF.  Medically stable for discharge today.   Following problems were addressed during the hospitalization:  Acute metabolic encephalopathy on the background of dementia: Thought to be secondary to polypharmacy.  Numerous admissions in the past with the same.  Follows with pain management.  Recently her provider had discontinued oxycodone and put her on morphine.  She is also on gabapentin.  Mental status has  improved , but still intermittently confused, oriented to place.As per  husband this is chronic problem, she is forgetful and has memory issues. This morning she looks comfortable, not agitated.  Answers  questions but disoriented to time.   Positive C. 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  C. difficile antigen/PCR positive with negative toxin.  no leukocytosis or AKI.   Abdominal x-ray did not show any acute findings. But patient has frequent diarrhea so started on 10-day course of vancomycin .  Diarrhea resolved   Hyponatremia: This could be from hypovolemic hyponatremia  from diarrhea. Improved   chronic pain syndrome: On gabapentin, MS Contin at home  .  Resume low-dose oxycodone, low-dose gabapentin   Hypertension:   Continue losartan with increased dose.  Added amlodipine.     Hyperlipidemia: On statin   Candidiasis of female genitalia: Suspected to have fungal infection around genitalia and gluteal fold.  Nystatin cream ordered   History of CVA: On aspirin, Plavix   Hypokalemia: Potassium and magnesium supplemented  Discharge Diagnoses:  Principal Problem:   Acute metabolic encephalopathy Active Problems:   Chronic pain   Hyperlipidemia   Essential hypertension   Hypokalemia   History of CVA (cerebrovascular accident)   Candidiasis of female genitalia   Metabolic encephalopathy    Discharge Instructions  Discharge Instructions     Diet Carb Modified   Complete by: As directed    Discharge instructions   Complete by: As directed    1)Please take prescribed medications as instructed 2)Do  a CBC and BMP tests in a  week   Increase activity slowly   Complete by: As directed       Allergies as of 12/10/2022       Reactions   Cefuroxime Other (See Comments)   Oral ulcers   Cephalexin  Other (See Comments)   Took off first layer of skin inside of mouth   Chlorhexidine Other (See Comments)   Mouth broke out- oral ulcers   Prednisone Other (See Comments)   Possible oral ulcers   Zithromax [azithromycin Dihydrate] Other (See Comments)   ORAL ULCERS        Medication List     STOP taking these medications    furosemide 20 MG  tablet Commonly known as: LASIX   gabapentin 600 MG tablet Commonly known as: NEURONTIN Replaced by: gabapentin 100 MG capsule   morphine 15 MG 12 hr tablet Commonly known as: MS CONTIN       TAKE these medications    albuterol 108 (90 Base) MCG/ACT inhaler Commonly known as: VENTOLIN HFA Inhale 2 puffs into the lungs every 6 (six) hours as needed for wheezing or shortness of breath.   amLODipine 10 MG tablet Commonly known as: NORVASC Take 1 tablet (10 mg total) by mouth daily. Start taking on: December 11, 2022   ascorbic acid 500 MG tablet Commonly known as: VITAMIN C Take 500 mg by mouth daily.   aspirin EC 81 MG tablet Take 1 tablet (81 mg total) by mouth daily. Swallow whole.   BIOTIN EXTRA STRENGTH PO Take 500 mg by mouth daily.   Centrum Silver 50+Women Tabs Take 1 tablet by mouth daily with breakfast.   cholecalciferol 25 MCG (1000 UNIT) tablet Commonly known as: VITAMIN D3 Take 1,000 Units by mouth daily.   clopidogrel 75 MG tablet Commonly known as: PLAVIX Take 75 mg by mouth daily.   diclofenac 75 MG EC tablet Commonly known as: VOLTAREN Take 75 mg by mouth 2 (two) times daily as needed for mild pain.   gabapentin 100 MG capsule Commonly known as: Neurontin Take 1 capsule (100 mg total) by mouth 2 (two) times daily. Replaces: gabapentin 600 MG tablet   losartan 100 MG tablet Commonly known as: COZAAR Take 1 tablet (100 mg total) by mouth daily. Start taking on: December 11, 2022 What changed:  medication strength how much to take   metFORMIN 500 MG 24 hr tablet Commonly known as: GLUCOPHAGE-XR Take 500 mg by mouth daily with breakfast.   methocarbamol 500 MG tablet Commonly known as: ROBAXIN Take 500 mg by mouth daily as needed for muscle spasms.   nystatin ointment Commonly known as: MYCOSTATIN Apply topically 2 (two) times daily.   ondansetron 4 MG tablet Commonly known as: ZOFRAN Take 1 tablet (4 mg total) by mouth every 6 (six)  hours as needed for nausea.   oxyCODONE 5 MG immediate release tablet Commonly known as: Oxy IR/ROXICODONE Take 1 tablet (5 mg total) by mouth every 6 (six) hours as needed for moderate pain or severe pain.   potassium chloride SA 20 MEQ tablet Commonly known as: KLOR-CON M Take 2 tablets (40 mEq total) by mouth daily for 7 days. Start taking on: December 11, 2022   rosuvastatin 10 MG tablet Commonly known as: CRESTOR TAKE 1 TABLET BY MOUTH EVERY DAY   TYLENOL 500 MG tablet Generic drug: acetaminophen Take 1,000 mg by mouth every 6 (six) hours as needed for headache or mild pain.   vancomycin 125 MG capsule Commonly known as: VANCOCIN Take 1 capsule (125 mg total) by mouth 4 (four) times daily for 8 days.        Contact information for after-discharge care     Destination     [facility] Preferred SNF .  Service: Skilled Nursing Contact information: [place] [address] [phone]                    Allergies  Allergen Reactions   Cefuroxime Other (See Comments)    Oral ulcers   Cephalexin Other (See Comments)    Took off first layer of skin inside of mouth   Chlorhexidine Other (See Comments)    Mouth broke out- oral ulcers   Prednisone Other (See Comments)    Possible oral ulcers   Zithromax [Azithromycin Dihydrate] Other (See Comments)    ORAL ULCERS     Consultations: None   Procedures/Studies: DG Abd Portable 2V  Result Date: 12/07/2022 CLINICAL DATA:  Incontinence EXAM: PORTABLE ABDOMEN - 2 VIEW COMPARISON:  11/15/2022 FINDINGS: Scattered large and small bowel gas is noted. No obstructive changes are seen. Degenerative changes of lumbar spine are noted. Bilateral hip replacements are seen. IMPRESSION: No acute abnormality noted. Electronically Signed   By: Inez Catalina M.D.   On: 12/07/2022 01:03   CT Head Wo Contrast  Result Date: 12/06/2022 CLINICAL DATA:  Mental status change, unknown cause  EXAM: CT HEAD WITHOUT CONTRAST TECHNIQUE: Contiguous axial images were obtained from the base of the skull through the vertex without intravenous contrast. RADIATION DOSE REDUCTION: This exam was performed according to the departmental dose-optimization program which includes automated exposure control, adjustment of the mA and/or kV according to patient size and/or use of iterative reconstruction technique. COMPARISON:  MRI head 11/16/2022 FINDINGS: Brain: Cerebral ventricle sizes are concordant with the degree of cerebral volume loss. Patchy and confluent areas of decreased attenuation are noted throughout the deep and periventricular white matter of the cerebral hemispheres bilaterally, compatible with chronic microvascular ischemic disease. Bilateral chronic cerebellar infarctions. No evidence of large-territorial acute infarction. No parenchymal hemorrhage. No mass lesion. No extra-axial collection. No mass effect or midline shift. No hydrocephalus. Basilar cisterns are patent. Vascular: No hyperdense vessel. Skull: No acute fracture or focal lesion. Sinuses/Orbits: Paranasal sinuses and mastoid air cells are clear. Bilateral lens replacement. The orbits are unremarkable. Other: None. IMPRESSION: No acute intracranial abnormality. Electronically Signed   By: Iven Finn M.D.   On: 12/06/2022 19:42   DG Chest Port 1 View  Result Date: 12/06/2022 CLINICAL DATA:  Shortness of breath, anorexia, and altered mental status for 4 days EXAM: PORTABLE CHEST 1 VIEW COMPARISON:  Portable exam 1839 hours compared to 11/15/2022 FINDINGS: Normal heart size with mitral annular calcification. Mediastinal contours and pulmonary vascularity normal. Atherosclerotic calcification aorta. Postsurgical changes LEFT lung with mild volume loss LEFT chest. No pulmonary infiltrate, pleural effusion, or pneumothorax. Bones demineralized. Postsurgical changes of RIGHT mastectomy and axillary node dissection. IMPRESSION: No acute  abnormalities. Aortic Atherosclerosis (ICD10-I70.0). Electronically Signed   By: Lavonia Dana M.D.   On: 12/06/2022 19:42   CT ANGIO HEAD NECK W WO CM  Result Date: 11/16/2022 CLINICAL DATA:  TIA. EXAM: CT ANGIOGRAPHY HEAD AND NECK TECHNIQUE: Multidetector CT imaging of the head and neck was performed using the standard protocol during bolus administration of intravenous contrast. Multiplanar CT image reconstructions and MIPs were obtained to evaluate the vascular anatomy. Carotid stenosis measurements (when applicable) are obtained utilizing NASCET criteria, using the distal internal carotid diameter as the denominator. RADIATION DOSE REDUCTION: This exam was performed according to the departmental dose-optimization program which includes automated exposure control, adjustment of the mA and/or kV according to patient size and/or use of iterative reconstruction technique. CONTRAST:  35mL OMNIPAQUE  IOHEXOL 350 MG/ML SOLN COMPARISON:  Same day brain MRI, noncontrast CT head 1 day prior. CTA head/neck 09/06/2022 FINDINGS: CT HEAD FINDINGS Brain: There is no acute intracranial hemorrhage, extra-axial fluid collection, or acute infarct. The small subacute infarcts seen on the prior brain MRI are not seen on the current study Parenchymal volume is normal. The ventricles are normal in size. Gray-white differentiation is preserved. Remote infarcts in the bilateral cerebellar hemispheres are again seen. The pituitary and suprasellar region are normal there is no mass lesion there is no mass effect or midline shift. Vascular: See below. Skull: Normal. Negative for fracture or focal lesion. Sinuses/Orbits: The paranasal sinuses are clear. Bilateral lens implants are in place. The globes and orbits are otherwise unremarkable. Other: None. Review of the MIP images confirms the above findings CTA NECK FINDINGS Aortic arch: There is calcified plaque in the imaged aortic arch. The origins of the major branch vessels are patent.  The subclavian arteries are patent to the level imaged. Right carotid system: The right common carotid artery is patent. There is mixed plaque at the bifurcation resulting in less than 50% stenosis. The distal internal carotid artery is patent. The external carotid artery is patent. There is no evidence of dissection or aneurysm. Left carotid system: The left common carotid artery is patent. There is mild plaque at the bifurcation resulting in less than 50% stenosis. The distal internal carotid artery is patent. The external carotid artery is patent. There is no evidence of dissection or aneurysm. Vertebral arteries: The vertebral arteries are patent, without hemodynamically significant stenosis or occlusion there is no evidence of dissection or aneurysm. Skeleton: There is no acute osseous abnormality or suspicious osseous lesion. Sequela of prior cervical fusion and infection is unchanged. There is no visible canal hematoma. Other neck: The soft tissues of the neck are unremarkable. Upper chest: Biapical scarring and left upper lobe postsurgical changes and bronchiectasis are again noted. There is no acute abnormality. Review of the MIP images confirms the above findings CTA HEAD FINDINGS Anterior circulation: There is calcified plaque in the carotid siphons without hemodynamically significant stenosis. The bilateral M1 segments are patent. There is atherosclerotic irregularity of the distal branches without proximal high-grade stenosis or occlusion. The ACAs are patent, with atherosclerotic irregularity and narrowing of the distal branches but no proximal high-grade stenosis or occlusion. Findings are unchanged. There is no aneurysm or AVM. Posterior circulation: The bilateral V4 segments are patent. The basilar artery is patent. The major cerebellar arteries appear patent. The bilateral PCAs are patent, without proximal high-grade stenosis or occlusion. Bilateral posterior communicating arteries are identified.  There is no aneurysm or AVM. Venous sinuses: As permitted by contrast timing, patent. Anatomic variants: None. Review of the MIP images confirms the above findings IMPRESSION: 1. Stable noncontrast head CT with no acute intracranial pathology. 2. Stable CTA head/neck since 09/06/2022. 3. Unchanged calcified plaque at the carotid bifurcations, right worse than left, without hemodynamically significant stenosis. 4. Stable intracranial vasculature with no proximal high-grade stenosis or occlusion. Electronically Signed   By: Valetta Mole M.D.   On: 11/16/2022 16:21   MR BRAIN WO CONTRAST  Result Date: 11/16/2022 CLINICAL DATA:  Initial evaluation for altered mental status. EXAM: MRI HEAD WITHOUT CONTRAST TECHNIQUE: Multiplanar, multiecho pulse sequences of the brain and surrounding structures were obtained without intravenous contrast. COMPARISON:  Prior CT from 11/15/2022. FINDINGS: Brain: Examination moderately to severely degraded by motion artifact. Generalized age-related cerebral atrophy. Few scattered remote bilateral cerebellar infarcts noted. Chronic  microvascular ischemic disease noted within the pons. Few scattered patchy subcentimeter foci of diffusion signal abnormality are seen involving the right frontal centrum semi ovale (series 5, images 25, 26). Associated T2/FLAIR signal without visible signal loss on ADC map. Findings likely reflect small evolving subacute small vessel type infarcts. No associated hemorrhage or mass effect. No other evidence for acute or recent infarction. Gray-white matter differentiation otherwise maintained. No other visible areas of chronic cortical infarction. No visible acute or chronic intracranial blood products. No mass lesion, midline shift or mass effect. No hydrocephalus or extra-axial fluid collection. Vascular: Major intracranial vascular flow voids are maintained. Skull and upper cervical spine: Bone marrow signal intensity grossly within normal limits. No scalp  soft tissue abnormality. Sinuses/Orbits: Prior bilateral ocular lens replacement. Paranasal sinuses are clear. No significant mastoid effusion. Other: None. IMPRESSION: 1. Motion degraded exam. 2. Few scattered subcentimeter foci of diffusion signal abnormality involving the right frontal centrum semi ovale, likely reflecting small evolving subacute small vessel type infarcts. No associated hemorrhage or mass effect. 3. Underlying age-related cerebral atrophy with mild chronic small vessel ischemic disease, with a few scattered remote bilateral cerebellar infarcts. Electronically Signed   By: Jeannine Boga M.D.   On: 11/16/2022 01:04   DG Abdomen 1 View  Result Date: 11/15/2022 CLINICAL DATA:  Pre MRI evaluation EXAM: ABDOMEN - 1 VIEW COMPARISON:  09/10/2022 FINDINGS: Bilateral hip replacements are noted. Degenerative changes of lumbar spine are seen. Postsurgical changes are noted in the left upper quadrant. No radiopaque foreign body is seen. No free air is noted. IMPRESSION: No evidence of radiopaque foreign body. Electronically Signed   By: Inez Catalina M.D.   On: 11/15/2022 23:16   CT Head Wo Contrast  Result Date: 11/15/2022 CLINICAL DATA:  Altered mental status, failure to thrive EXAM: CT HEAD WITHOUT CONTRAST TECHNIQUE: Contiguous axial images were obtained from the base of the skull through the vertex without intravenous contrast. RADIATION DOSE REDUCTION: This exam was performed according to the departmental dose-optimization program which includes automated exposure control, adjustment of the mA and/or kV according to patient size and/or use of iterative reconstruction technique. COMPARISON:  10/31/2022 FINDINGS: Brain: No evidence of acute infarction, hemorrhage, hydrocephalus, extra-axial collection or mass lesion/mass effect. Mild cortical atrophy. Vascular: Mild intracranial atherosclerosis. Skull: Normal. Negative for fracture or focal lesion. Sinuses/Orbits: The visualized paranasal  sinuses are essentially clear. The mastoid air cells are unopacified. Other: None. IMPRESSION: No evidence of acute intracranial abnormality. Mild cortical atrophy. Electronically Signed   By: Julian Hy M.D.   On: 11/15/2022 21:15   DG Chest Port 1 View  Result Date: 11/15/2022 CLINICAL DATA:  Weakness, failure to thrive EXAM: PORTABLE CHEST 1 VIEW COMPARISON:  10/31/2022 FINDINGS: Suture line in the left upper lobe. Associated volume loss in left hemithorax. Right lung is clear. No pneumothorax. The heart is normal in size. Surgical clips in the right chest wall/axilla. Old left rib fracture deformities. IMPRESSION: No evidence of acute cardiopulmonary disease. Electronically Signed   By: Julian Hy M.D.   On: 11/15/2022 17:55      Subjective: Patient seen and examined at bedside today.  Hemodynamically stable.  Comfortable.  Lying in bed.  Diarrhea has improved.  Denies abdomen pain, nausea or vomiting.  She communicates well and answers questions, obeys commands but not oriented to time.I called and discussed with husband on phone about discharge planning  Discharge Exam: Vitals:   12/09/22 2019 12/10/22 0443  BP: (!) 144/87 129/81  Pulse: 80  100  Resp: 16 16  Temp: 98.4 F (36.9 C) 98.2 F (36.8 C)  SpO2: 98% 96%   Vitals:   12/09/22 0551 12/09/22 1242 12/09/22 2019 12/10/22 0443  BP: (!) 178/83 (!) 141/83 (!) 144/87 129/81  Pulse: 89 79 80 100  Resp: 17 16 16 16   Temp: 97.8 F (36.6 C) 98.6 F (37 C) 98.4 F (36.9 C) 98.2 F (36.8 C)  TempSrc: Oral Oral Oral Oral  SpO2: 100% 100% 98% 96%  Weight:      Height:        General: Pt is alert, awake, not in acute distress, thin built, deconditioned Cardiovascular: RRR, S1/S2 +, no rubs, no gallops Respiratory: CTA bilaterally, no wheezing, no rhonchi Abdominal: Soft, NT, ND, bowel sounds + Extremities: no edema, no cyanosis    The results of significant diagnostics from this hospitalization (including  imaging, microbiology, ancillary and laboratory) are listed below for reference.     Microbiology: Recent Results (from the past 240 hour(s))  Resp panel by RT-PCR (RSV, Flu A&B, Covid) Anterior Nasal Swab     Status: None   Collection Time: 12/07/22  1:10 AM   Specimen: Anterior Nasal Swab  Result Value Ref Range Status   SARS Coronavirus 2 by RT PCR NEGATIVE NEGATIVE Final    Comment: (NOTE) SARS-CoV-2 target nucleic acids are NOT DETECTED.  The SARS-CoV-2 RNA is generally detectable in upper respiratory specimens during the acute phase of infection. The lowest concentration of SARS-CoV-2 viral copies this assay can detect is 138 copies/mL. A negative result does not preclude SARS-Cov-2 infection and should not be used as the sole basis for treatment or other patient management decisions. A negative result may occur with  improper specimen collection/handling, submission of specimen other than nasopharyngeal swab, presence of viral mutation(s) within the areas targeted by this assay, and inadequate number of viral copies(<138 copies/mL). A negative result must be combined with clinical observations, patient history, and epidemiological information. The expected result is Negative.  Fact Sheet for Patients:  EntrepreneurPulse.com.au  Fact Sheet for Healthcare Providers:  IncredibleEmployment.be  This test is no t yet approved or cleared by the [place] FDA and  has been authorized for detection and/or diagnosis of SARS-CoV-2 by FDA under an Emergency Use Authorization (EUA). This EUA will remain  in effect (meaning this test can be used) for the duration of the COVID-19 declaration under Section 564(b)(1) of the Act, 21 U.S.C.section 360bbb-3(b)(1), unless the authorization is terminated  or revoked sooner.       Influenza A by PCR NEGATIVE NEGATIVE Final   Influenza B by PCR NEGATIVE NEGATIVE Final    Comment: (NOTE) The Xpert  Xpress SARS-CoV-2/FLU/RSV plus assay is intended as an aid in the diagnosis of influenza from Nasopharyngeal swab specimens and should not be used as a sole basis for treatment. Nasal washings and aspirates are unacceptable for Xpert Xpress SARS-CoV-2/FLU/RSV testing.  Fact Sheet for Patients: EntrepreneurPulse.com.au  Fact Sheet for Healthcare Providers: IncredibleEmployment.be  This test is not yet approved or cleared by the [place] FDA and has been authorized for detection and/or diagnosis of SARS-CoV-2 by FDA under an Emergency Use Authorization (EUA). This EUA will remain in effect (meaning this test can be used) for the duration of the COVID-19 declaration under Section 564(b)(1) of the Act, 21 U.S.C. section 360bbb-3(b)(1), unless the authorization is terminated or revoked.     Resp Syncytial Virus by PCR NEGATIVE NEGATIVE Final    Comment: (NOTE) Fact Sheet for  Patients: EntrepreneurPulse.com.au  Fact Sheet for Healthcare Providers: IncredibleEmployment.be  This test is not yet approved or cleared by the [place] FDA and has been authorized for detection and/or diagnosis of SARS-CoV-2 by FDA under an Emergency Use Authorization (EUA). This EUA will remain in effect (meaning this test can be used) for the duration of the COVID-19 declaration under Section 564(b)(1) of the Act, 21 U.S.C. section 360bbb-3(b)(1), unless the authorization is terminated or revoked.  Performed at [facility], [place] [address] [place], [place] [address]   C Difficile Quick Screen w PCR reflex     Status: Abnormal   Collection Time: 12/07/22  2:46 AM   Specimen: STOOL  Result Value Ref Range Status   C Diff antigen POSITIVE (A) NEGATIVE Final   C Diff toxin NEGATIVE NEGATIVE Final   C Diff interpretation Results are indeterminate. See PCR results.  Final    Comment: Performed at [facility], [place] [address] [place], [place] [address]  C. Diff by PCR, Reflexed     Status: Abnormal   Collection Time: 12/07/22  2:46 AM  Result Value Ref Range Status   Toxigenic C. Difficile by PCR POSITIVE (A) NEGATIVE Final    Comment: Positive for toxigenic C. difficile with little to no toxin production. Only treat if clinical presentation suggests symptomatic illness. Performed at [facility], [place] [address], [place], [place] [address]      Labs: BNP (last 3 results) No results for input(s): "BNP" in the last 8760 hours. Basic Metabolic Panel: Recent Labs  Lab 12/06/22 1955 12/07/22 0328 12/07/22 1438 12/08/22 0317 12/08/22 1402 12/09/22 0358 12/10/22 0407  NA 132* 134*  --  131*  --  127* 131*  K 2.6* 2.6* 3.7 2.9* 3.4* 3.7 3.2*  CL 99 101  --  98  --  98 103  CO2 24 23  --  21*  --  18* 21*  GLUCOSE 104* 109*  --  91  --  81 102*  BUN 12 12  --  8  --  8 10  CREATININE 0.51 0.48  --  0.41*  --  0.50 0.44  CALCIUM 8.5* 8.5*  --  8.4*  --  8.6* 8.3*  MG 1.8 1.7  --  1.8  --  2.1  --    Liver Function Tests: Recent Labs  Lab 12/06/22 1955  AST 19  ALT 13  ALKPHOS 57  BILITOT 0.8  PROT 6.8  ALBUMIN 3.1*   No results for input(s): "LIPASE", "AMYLASE" in the last 168 hours. No results for input(s): "AMMONIA" in the last 168 hours. CBC: Recent Labs  Lab 12/06/22 1955 12/08/22 0317  WBC 7.3 6.9  NEUTROABS 5.2  --   HGB 11.5* 12.2  HCT 33.8* 35.3*  MCV 90.4 89.1  PLT 283 271   Cardiac Enzymes: No results for input(s): "CKTOTAL", "CKMB", "CKMBINDEX", "TROPONINI" in the last 168 hours. BNP: Invalid input(s): "POCBNP" CBG: No results for input(s): "GLUCAP" in the last 168 hours. D-Dimer No results for input(s): "DDIMER" in the last 72 hours. Hgb A1c No results for input(s): "HGBA1C" in the last 72 hours. Lipid Profile No results for input(s): "CHOL", "HDL", "LDLCALC", "TRIG", "CHOLHDL", "LDLDIRECT" in the last 72 hours. Thyroid  function studies No results for input(s): "TSH", "T4TOTAL", "T3FREE", "THYROIDAB" in the last 72 hours.  Invalid input(s): "FREET3" Anemia work up No results for input(s): "VITAMINB12", "FOLATE", "FERRITIN", "TIBC", "IRON", "RETICCTPCT" in the last 72 hours. Urinalysis  Component Value Date/Time   COLORURINE STRAW (A) 12/06/2022 2232   APPEARANCEUR CLEAR 12/06/2022 2232   LABSPEC 1.009 12/06/2022 2232   PHURINE 7.0 12/06/2022 2232   GLUCOSEU NEGATIVE 12/06/2022 2232   HGBUR NEGATIVE 12/06/2022 2232   BILIRUBINUR NEGATIVE 12/06/2022 2232   KETONESUR 5 (A) 12/06/2022 2232   PROTEINUR 30 (A) 12/06/2022 2232   UROBILINOGEN 1.0 04/26/2015 1748   NITRITE NEGATIVE 12/06/2022 2232   LEUKOCYTESUR NEGATIVE 12/06/2022 2232   Sepsis Labs Recent Labs  Lab 12/06/22 1955 12/08/22 0317  WBC 7.3 6.9   Microbiology Recent Results (from the past 240 hour(s))  Resp panel by RT-PCR (RSV, Flu A&B, Covid) Anterior Nasal Swab     Status: None   Collection Time: 12/07/22  1:10 AM   Specimen: Anterior Nasal Swab  Result Value Ref Range Status   SARS Coronavirus 2 by RT PCR NEGATIVE NEGATIVE Final    Comment: (NOTE) SARS-CoV-2 target nucleic acids are NOT DETECTED.  The SARS-CoV-2 RNA is generally detectable in upper respiratory specimens during the acute phase of infection. The lowest concentration of SARS-CoV-2 viral copies this assay can detect is 138 copies/mL. A negative result does not preclude SARS-Cov-2 infection and should not be used as the sole basis for treatment or other patient management decisions. A negative result may occur with  improper specimen collection/handling, submission of specimen other than nasopharyngeal swab, presence of viral mutation(s) within the areas targeted by this assay, and inadequate number of viral copies(<138 copies/mL). A negative result must be combined with clinical observations, patient history, and epidemiological information. The expected  result is Negative.  Fact Sheet for Patients:  EntrepreneurPulse.com.au  Fact Sheet for Healthcare Providers:  IncredibleEmployment.be  This test is no t yet approved or cleared by the [place] FDA and  has been authorized for detection and/or diagnosis of SARS-CoV-2 by FDA under an Emergency Use Authorization (EUA). This EUA will remain  in effect (meaning this test can be used) for the duration of the COVID-19 declaration under Section 564(b)(1) of the Act, 21 U.S.C.section 360bbb-3(b)(1), unless the authorization is terminated  or revoked sooner.       Influenza A by PCR NEGATIVE NEGATIVE Final   Influenza B by PCR NEGATIVE NEGATIVE Final    Comment: (NOTE) The Xpert Xpress SARS-CoV-2/FLU/RSV plus assay is intended as an aid in the diagnosis of influenza from Nasopharyngeal swab specimens and should not be used as a sole basis for treatment. Nasal washings and aspirates are unacceptable for Xpert Xpress SARS-CoV-2/FLU/RSV testing.  Fact Sheet for Patients: EntrepreneurPulse.com.au  Fact Sheet for Healthcare Providers: IncredibleEmployment.be  This test is not yet approved or cleared by the [place] FDA and has been authorized for detection and/or diagnosis of SARS-CoV-2 by FDA under an Emergency Use Authorization (EUA). This EUA will remain in effect (meaning this test can be used) for the duration of the COVID-19 declaration under Section 564(b)(1) of the Act, 21 U.S.C. section 360bbb-3(b)(1), unless the authorization is terminated or revoked.     Resp Syncytial Virus by PCR NEGATIVE NEGATIVE Final    Comment: (NOTE) Fact Sheet for Patients: EntrepreneurPulse.com.au  Fact Sheet for Healthcare Providers: IncredibleEmployment.be  This test is not yet approved or cleared by the [place] FDA and has been authorized for detection and/or diagnosis of  SARS-CoV-2 by FDA under an Emergency Use Authorization (EUA). This EUA will remain in effect (meaning this test can be used) for the duration of the COVID-19 declaration under Section 564(b)(1) of the Act, 21  U.S.C. section 360bbb-3(b)(1), unless the authorization is terminated or revoked.  Performed at [facility], [place] [address] [place], [place] [address]   C Difficile Quick Screen w PCR reflex     Status: Abnormal   Collection Time: 12/07/22  2:46 AM   Specimen: STOOL  Result Value Ref Range Status   C Diff antigen POSITIVE (A) NEGATIVE Final   C Diff toxin NEGATIVE NEGATIVE Final   C Diff interpretation Results are indeterminate. See PCR results.  Final    Comment: Performed at [facility], [place] [address] [place], [place] [address]  C. Diff by PCR, Reflexed     Status: Abnormal   Collection Time: 12/07/22  2:46 AM  Result Value Ref Range Status   Toxigenic C. Difficile by PCR POSITIVE (A) NEGATIVE Final    Comment: Positive for toxigenic C. difficile with little to no toxin production. Only treat if clinical presentation suggests symptomatic illness. Performed at [facility], [place] [address], [place], [place] [address]     Please note: You were cared for by a hospitalist during your hospital stay. Once you are discharged, your primary care physician will handle any further medical issues. Please note that NO REFILLS for any discharge medications will be authorized once you are discharged, as it is imperative that you return to your primary care physician (or establish a relationship with a primary care physician if you do not have one) for your post hospital discharge needs so that they can reassess your need for medications and monitor your lab values.    Time coordinating discharge: 40 minutes  SIGNED:   Shelly Coss, MD  [facility] 12/10/2022, 11:06 AM Pager [phone]  If 7PM-7AM, please contact  night-coverage [email or web address] Password [email or web address]

## 2022-12-10 NOTE — TOC Progression Note (Signed)
Transition of Care [facility]) - Progression Note    Patient Details  Name: [name] MRN: [id number] Date of Birth: 1945-09-26  [facility]) CM/SW Contact  Purcell Mouton, RN Phone Number: 12/10/2022, 1:14 PM  Clinical Narrative:     Corey Harold was called for transport to [facility].   Expected Discharge Plan: [place] Barriers to Discharge: Continued Medical Work up  Expected Discharge Plan and Services In-house Referral: Clinical Social Work   Post Acute Care Choice: [place] Living arrangements for the past 2 months: [place] Expected Discharge Date: 12/10/22               DME Arranged: N/A DME Agency: NA                   Social Determinants of Health (SDOH) Interventions SDOH Screenings   Food Insecurity: No Food Insecurity (12/09/2022)  Housing: [place]  (12/09/2022)  Transportation Needs: No Transportation Needs (12/09/2022)  Utilities: Not At Risk (12/09/2022)  Tobacco Use: High Risk (12/09/2022)    Readmission Risk Interventions    12/08/2022   12:02 PM 11/02/2022   12:01 PM  Readmission Risk Prevention Plan  Transportation Screening Complete Complete  PCP or Specialist Appt within 5-7 Days  Complete  Home Care Screening  Complete  Medication Review (RN CM)  Complete  HRI or Home Care Consult Complete   Social Work Consult for [place] Planning/Counseling Complete   Palliative Care Screening Not Applicable   Medication Review Press photographer) Complete

## 2022-12-10 NOTE — Plan of Care (Signed)
  Problem: Education: Goal: Knowledge of disease or condition will improve Outcome: Progressing   Problem: Coping: Goal: Will verbalize positive feelings about self Outcome: Progressing   Problem: Health Behavior/Discharge Planning: Goal: Ability to manage health-related needs will improve Outcome: Progressing   Problem: Self-Care: Goal: Ability to participate in self-care as condition permits will improve Outcome: Progressing Goal: Ability to communicate needs accurately will improve Outcome: Progressing   Problem: Nutrition: Goal: Risk of aspiration will decrease Outcome: Progressing   Problem: Clinical Measurements: Goal: Diagnostic test results will improve Outcome: Progressing   Problem: Activity: Goal: Risk for activity intolerance will decrease Outcome: Progressing   Problem: Elimination: Goal: Will not experience complications related to bowel motility Outcome: Progressing   Problem: Safety: Goal: Ability to remain free from injury will improve Outcome: Progressing

## 2022-12-11 DIAGNOSIS — M6281 Muscle weakness (generalized): Secondary | ICD-10-CM | POA: Diagnosis not present

## 2022-12-11 DIAGNOSIS — J449 Chronic obstructive pulmonary disease, unspecified: Secondary | ICD-10-CM | POA: Diagnosis not present

## 2022-12-11 DIAGNOSIS — R2689 Other abnormalities of gait and mobility: Secondary | ICD-10-CM | POA: Diagnosis not present

## 2022-12-12 DIAGNOSIS — E876 Hypokalemia: Secondary | ICD-10-CM | POA: Diagnosis not present

## 2022-12-12 DIAGNOSIS — G894 Chronic pain syndrome: Secondary | ICD-10-CM | POA: Diagnosis not present

## 2022-12-15 DIAGNOSIS — E785 Hyperlipidemia, unspecified: Secondary | ICD-10-CM | POA: Diagnosis not present

## 2022-12-15 DIAGNOSIS — G9340 Encephalopathy, unspecified: Secondary | ICD-10-CM | POA: Diagnosis not present

## 2022-12-15 DIAGNOSIS — I10 Essential (primary) hypertension: Secondary | ICD-10-CM | POA: Diagnosis not present

## 2022-12-15 DIAGNOSIS — I639 Cerebral infarction, unspecified: Secondary | ICD-10-CM | POA: Diagnosis not present

## 2022-12-25 DIAGNOSIS — R062 Wheezing: Secondary | ICD-10-CM | POA: Diagnosis not present

## 2022-12-25 DIAGNOSIS — Z6820 Body mass index (BMI) 20.0-20.9, adult: Secondary | ICD-10-CM | POA: Diagnosis not present

## 2022-12-25 DIAGNOSIS — D649 Anemia, unspecified: Secondary | ICD-10-CM | POA: Diagnosis not present

## 2022-12-25 DIAGNOSIS — E876 Hypokalemia: Secondary | ICD-10-CM | POA: Diagnosis not present

## 2022-12-25 DIAGNOSIS — Z09 Encounter for follow-up examination after completed treatment for conditions other than malignant neoplasm: Secondary | ICD-10-CM | POA: Diagnosis not present

## 2022-12-25 DIAGNOSIS — N390 Urinary tract infection, site not specified: Secondary | ICD-10-CM | POA: Diagnosis not present

## 2022-12-25 DIAGNOSIS — I10 Essential (primary) hypertension: Secondary | ICD-10-CM | POA: Diagnosis not present

## 2022-12-25 DIAGNOSIS — R300 Dysuria: Secondary | ICD-10-CM | POA: Diagnosis not present

## 2022-12-29 ENCOUNTER — Emergency Department (HOSPITAL_COMMUNITY): Payer: Medicare Other

## 2022-12-29 ENCOUNTER — Inpatient Hospital Stay (HOSPITAL_COMMUNITY)
Admission: EM | Admit: 2022-12-29 | Discharge: 2023-01-06 | DRG: 535 | Disposition: A | Discharge status: Skilled Nursing Facility | Payer: Medicare Other | Attending: Internal Medicine | Admitting: Internal Medicine

## 2022-12-29 ENCOUNTER — Inpatient Hospital Stay (HOSPITAL_COMMUNITY): Payer: Medicare Other

## 2022-12-29 DIAGNOSIS — Z888 Allergy status to other drugs, medicaments and biological substances status: Secondary | ICD-10-CM

## 2022-12-29 DIAGNOSIS — J440 Chronic obstructive pulmonary disease with acute lower respiratory infection: Secondary | ICD-10-CM | POA: Diagnosis not present

## 2022-12-29 DIAGNOSIS — R0602 Shortness of breath: Secondary | ICD-10-CM | POA: Diagnosis not present

## 2022-12-29 DIAGNOSIS — J439 Emphysema, unspecified: Secondary | ICD-10-CM | POA: Diagnosis not present

## 2022-12-29 DIAGNOSIS — E876 Hypokalemia: Secondary | ICD-10-CM | POA: Diagnosis not present

## 2022-12-29 DIAGNOSIS — I472 Ventricular tachycardia, unspecified: Secondary | ICD-10-CM | POA: Diagnosis present

## 2022-12-29 DIAGNOSIS — I2510 Atherosclerotic heart disease of native coronary artery without angina pectoris: Secondary | ICD-10-CM | POA: Diagnosis not present

## 2022-12-29 DIAGNOSIS — T17990A Other foreign object in respiratory tract, part unspecified in causing asphyxiation, initial encounter: Secondary | ICD-10-CM | POA: Diagnosis not present

## 2022-12-29 DIAGNOSIS — M25572 Pain in left ankle and joints of left foot: Secondary | ICD-10-CM | POA: Diagnosis not present

## 2022-12-29 DIAGNOSIS — Z96649 Presence of unspecified artificial hip joint: Secondary | ICD-10-CM | POA: Diagnosis not present

## 2022-12-29 DIAGNOSIS — G928 Other toxic encephalopathy: Secondary | ICD-10-CM | POA: Diagnosis not present

## 2022-12-29 DIAGNOSIS — S72001A Fracture of unspecified part of neck of right femur, initial encounter for closed fracture: Principal | ICD-10-CM | POA: Diagnosis present

## 2022-12-29 DIAGNOSIS — E119 Type 2 diabetes mellitus without complications: Secondary | ICD-10-CM | POA: Diagnosis present

## 2022-12-29 DIAGNOSIS — M6281 Muscle weakness (generalized): Secondary | ICD-10-CM | POA: Diagnosis not present

## 2022-12-29 DIAGNOSIS — J90 Pleural effusion, not elsewhere classified: Secondary | ICD-10-CM | POA: Diagnosis not present

## 2022-12-29 DIAGNOSIS — M25561 Pain in right knee: Secondary | ICD-10-CM | POA: Diagnosis not present

## 2022-12-29 DIAGNOSIS — I10 Essential (primary) hypertension: Secondary | ICD-10-CM | POA: Diagnosis not present

## 2022-12-29 DIAGNOSIS — E785 Hyperlipidemia, unspecified: Secondary | ICD-10-CM | POA: Diagnosis not present

## 2022-12-29 DIAGNOSIS — E871 Hypo-osmolality and hyponatremia: Secondary | ICD-10-CM | POA: Diagnosis not present

## 2022-12-29 DIAGNOSIS — I7143 Infrarenal abdominal aortic aneurysm, without rupture: Secondary | ICD-10-CM | POA: Diagnosis present

## 2022-12-29 DIAGNOSIS — M47812 Spondylosis without myelopathy or radiculopathy, cervical region: Secondary | ICD-10-CM | POA: Diagnosis not present

## 2022-12-29 DIAGNOSIS — J45909 Unspecified asthma, uncomplicated: Secondary | ICD-10-CM | POA: Diagnosis not present

## 2022-12-29 DIAGNOSIS — E860 Dehydration: Secondary | ICD-10-CM | POA: Diagnosis not present

## 2022-12-29 DIAGNOSIS — J984 Other disorders of lung: Secondary | ICD-10-CM | POA: Diagnosis not present

## 2022-12-29 DIAGNOSIS — Z743 Need for continuous supervision: Secondary | ICD-10-CM | POA: Diagnosis not present

## 2022-12-29 DIAGNOSIS — Z7982 Long term (current) use of aspirin: Secondary | ICD-10-CM

## 2022-12-29 DIAGNOSIS — G9340 Encephalopathy, unspecified: Secondary | ICD-10-CM | POA: Diagnosis not present

## 2022-12-29 DIAGNOSIS — Z1152 Encounter for screening for COVID-19: Secondary | ICD-10-CM | POA: Diagnosis not present

## 2022-12-29 DIAGNOSIS — J9811 Atelectasis: Secondary | ICD-10-CM | POA: Diagnosis not present

## 2022-12-29 DIAGNOSIS — T40601A Poisoning by unspecified narcotics, accidental (unintentional), initial encounter: Secondary | ICD-10-CM | POA: Diagnosis present

## 2022-12-29 DIAGNOSIS — W19XXXA Unspecified fall, initial encounter: Secondary | ICD-10-CM

## 2022-12-29 DIAGNOSIS — Z833 Family history of diabetes mellitus: Secondary | ICD-10-CM

## 2022-12-29 DIAGNOSIS — T84019A Broken internal joint prosthesis, unspecified site, initial encounter: Secondary | ICD-10-CM | POA: Diagnosis not present

## 2022-12-29 DIAGNOSIS — Z955 Presence of coronary angioplasty implant and graft: Secondary | ICD-10-CM

## 2022-12-29 DIAGNOSIS — M25551 Pain in right hip: Secondary | ICD-10-CM | POA: Diagnosis not present

## 2022-12-29 DIAGNOSIS — F17200 Nicotine dependence, unspecified, uncomplicated: Secondary | ICD-10-CM | POA: Diagnosis not present

## 2022-12-29 DIAGNOSIS — N281 Cyst of kidney, acquired: Secondary | ICD-10-CM | POA: Diagnosis not present

## 2022-12-29 DIAGNOSIS — T17998A Other foreign object in respiratory tract, part unspecified causing other injury, initial encounter: Secondary | ICD-10-CM | POA: Diagnosis not present

## 2022-12-29 DIAGNOSIS — L89151 Pressure ulcer of sacral region, stage 1: Secondary | ICD-10-CM | POA: Diagnosis present

## 2022-12-29 DIAGNOSIS — Z7902 Long term (current) use of antithrombotics/antiplatelets: Secondary | ICD-10-CM

## 2022-12-29 DIAGNOSIS — M9701XA Periprosthetic fracture around internal prosthetic right hip joint, initial encounter: Secondary | ICD-10-CM | POA: Diagnosis present

## 2022-12-29 DIAGNOSIS — Z823 Family history of stroke: Secondary | ICD-10-CM

## 2022-12-29 DIAGNOSIS — N179 Acute kidney failure, unspecified: Secondary | ICD-10-CM | POA: Diagnosis not present

## 2022-12-29 DIAGNOSIS — F17210 Nicotine dependence, cigarettes, uncomplicated: Secondary | ICD-10-CM | POA: Diagnosis present

## 2022-12-29 DIAGNOSIS — Z853 Personal history of malignant neoplasm of breast: Secondary | ICD-10-CM

## 2022-12-29 DIAGNOSIS — L8990 Pressure ulcer of unspecified site, unspecified stage: Secondary | ICD-10-CM | POA: Insufficient documentation

## 2022-12-29 DIAGNOSIS — R911 Solitary pulmonary nodule: Secondary | ICD-10-CM | POA: Diagnosis not present

## 2022-12-29 DIAGNOSIS — T50915A Adverse effect of multiple unspecified drugs, medicaments and biological substances, initial encounter: Secondary | ICD-10-CM | POA: Diagnosis present

## 2022-12-29 DIAGNOSIS — Z79899 Other long term (current) drug therapy: Secondary | ICD-10-CM

## 2022-12-29 DIAGNOSIS — W1830XA Fall on same level, unspecified, initial encounter: Secondary | ICD-10-CM | POA: Diagnosis present

## 2022-12-29 DIAGNOSIS — F419 Anxiety disorder, unspecified: Secondary | ICD-10-CM | POA: Diagnosis present

## 2022-12-29 DIAGNOSIS — Z7984 Long term (current) use of oral hypoglycemic drugs: Secondary | ICD-10-CM

## 2022-12-29 DIAGNOSIS — I480 Paroxysmal atrial fibrillation: Secondary | ICD-10-CM | POA: Diagnosis present

## 2022-12-29 DIAGNOSIS — M549 Dorsalgia, unspecified: Secondary | ICD-10-CM | POA: Diagnosis present

## 2022-12-29 DIAGNOSIS — J449 Chronic obstructive pulmonary disease, unspecified: Secondary | ICD-10-CM

## 2022-12-29 DIAGNOSIS — I4729 Other ventricular tachycardia: Secondary | ICD-10-CM | POA: Diagnosis not present

## 2022-12-29 DIAGNOSIS — Z043 Encounter for examination and observation following other accident: Secondary | ICD-10-CM | POA: Diagnosis not present

## 2022-12-29 DIAGNOSIS — S7291XA Unspecified fracture of right femur, initial encounter for closed fracture: Secondary | ICD-10-CM

## 2022-12-29 DIAGNOSIS — Z8673 Personal history of transient ischemic attack (TIA), and cerebral infarction without residual deficits: Secondary | ICD-10-CM | POA: Diagnosis not present

## 2022-12-29 DIAGNOSIS — R8271 Bacteriuria: Secondary | ICD-10-CM | POA: Diagnosis present

## 2022-12-29 DIAGNOSIS — E43 Unspecified severe protein-calorie malnutrition: Secondary | ICD-10-CM | POA: Diagnosis present

## 2022-12-29 DIAGNOSIS — I252 Old myocardial infarction: Secondary | ICD-10-CM | POA: Diagnosis not present

## 2022-12-29 DIAGNOSIS — Z9861 Coronary angioplasty status: Secondary | ICD-10-CM

## 2022-12-29 DIAGNOSIS — Z79891 Long term (current) use of opiate analgesic: Secondary | ICD-10-CM

## 2022-12-29 DIAGNOSIS — D649 Anemia, unspecified: Secondary | ICD-10-CM | POA: Diagnosis present

## 2022-12-29 DIAGNOSIS — Z8619 Personal history of other infectious and parasitic diseases: Secondary | ICD-10-CM

## 2022-12-29 DIAGNOSIS — Z881 Allergy status to other antibiotic agents status: Secondary | ICD-10-CM

## 2022-12-29 DIAGNOSIS — J690 Pneumonitis due to inhalation of food and vomit: Secondary | ICD-10-CM | POA: Diagnosis present

## 2022-12-29 DIAGNOSIS — Z96643 Presence of artificial hip joint, bilateral: Secondary | ICD-10-CM | POA: Diagnosis present

## 2022-12-29 DIAGNOSIS — T17900A Unspecified foreign body in respiratory tract, part unspecified causing asphyxiation, initial encounter: Secondary | ICD-10-CM | POA: Diagnosis not present

## 2022-12-29 DIAGNOSIS — E1165 Type 2 diabetes mellitus with hyperglycemia: Secondary | ICD-10-CM

## 2022-12-29 DIAGNOSIS — J9601 Acute respiratory failure with hypoxia: Secondary | ICD-10-CM | POA: Diagnosis not present

## 2022-12-29 DIAGNOSIS — Y92009 Unspecified place in unspecified non-institutional (private) residence as the place of occurrence of the external cause: Secondary | ICD-10-CM | POA: Diagnosis not present

## 2022-12-29 DIAGNOSIS — R6889 Other general symptoms and signs: Secondary | ICD-10-CM | POA: Diagnosis not present

## 2022-12-29 DIAGNOSIS — Z8249 Family history of ischemic heart disease and other diseases of the circulatory system: Secondary | ICD-10-CM

## 2022-12-29 DIAGNOSIS — Z981 Arthrodesis status: Secondary | ICD-10-CM

## 2022-12-29 DIAGNOSIS — Z9011 Acquired absence of right breast and nipple: Secondary | ICD-10-CM

## 2022-12-29 DIAGNOSIS — G894 Chronic pain syndrome: Secondary | ICD-10-CM | POA: Diagnosis present

## 2022-12-29 LAB — CBC
HCT: 32.1 % — ABNORMAL LOW (ref 36.0–46.0)
HCT: 28.2 % — ABNORMAL LOW (ref 36.0–46.0)
Hemoglobin: 10.8 g/dL — ABNORMAL LOW (ref 12.0–15.0)
Hemoglobin: 9.2 g/dL — ABNORMAL LOW (ref 12.0–15.0)
MCH: 30.9 pg (ref 26.0–34.0)
MCH: 30.4 pg (ref 26.0–34.0)
MCHC: 33.6 g/dL (ref 30.0–36.0)
MCHC: 32.6 g/dL (ref 30.0–36.0)
MCV: 92 fL (ref 80.0–100.0)
MCV: 93.1 fL (ref 80.0–100.0)
Platelets: 192 10*3/uL (ref 150–400)
Platelets: 175 10*3/uL (ref 150–400)
RBC: 3.49 MIL/uL — ABNORMAL LOW (ref 3.87–5.11)
RBC: 3.03 MIL/uL — ABNORMAL LOW (ref 3.87–5.11)
RDW: 14.6 % (ref 11.5–15.5)
RDW: 14.4 % (ref 11.5–15.5)
WBC: 9.2 10*3/uL (ref 4.0–10.5)
WBC: 6.9 10*3/uL (ref 4.0–10.5)
nRBC: 0 % (ref 0.0–0.2)
nRBC: 0 % (ref 0.0–0.2)

## 2022-12-29 LAB — HEMOGLOBIN A1C
Hgb A1c MFr Bld: 5.4 % (ref 4.8–5.6)
Mean Plasma Glucose: 108.28 mg/dL

## 2022-12-29 LAB — URINALYSIS, W/ REFLEX TO CULTURE (INFECTION SUSPECTED)
Bilirubin Urine: NEGATIVE
Glucose, UA: NEGATIVE mg/dL
Ketones, ur: NEGATIVE mg/dL
Nitrite: POSITIVE — AB
Protein, ur: 30 mg/dL — AB
Specific Gravity, Urine: 1.017 (ref 1.005–1.030)
pH: 6 (ref 5.0–8.0)

## 2022-12-29 LAB — BLOOD GAS, VENOUS
Acid-Base Excess: 4 mmol/L — ABNORMAL HIGH (ref 0.0–2.0)
Bicarbonate: 29.2 mmol/L — ABNORMAL HIGH (ref 20.0–28.0)
O2 Saturation: 55 %
Patient temperature: 37
pCO2, Ven: 45 mmHg (ref 44–60)
pH, Ven: 7.42 (ref 7.25–7.43)
pO2, Ven: 32 mmHg (ref 32–45)

## 2022-12-29 LAB — HEMOGLOBIN AND HEMATOCRIT, BLOOD
HCT: 29.3 % — ABNORMAL LOW (ref 36.0–46.0)
Hemoglobin: 9.8 g/dL — ABNORMAL LOW (ref 12.0–15.0)

## 2022-12-29 LAB — CK: Total CK: 440 U/L — ABNORMAL HIGH (ref 38–234)

## 2022-12-29 LAB — RETICULOCYTES
Immature Retic Fract: 15.5 % (ref 2.3–15.9)
RBC.: 2.99 MIL/uL — ABNORMAL LOW (ref 3.87–5.11)
Retic Count, Absolute: 38 10*3/uL (ref 19.0–186.0)
Retic Ct Pct: 1.3 % (ref 0.4–3.1)

## 2022-12-29 LAB — COMPREHENSIVE METABOLIC PANEL
ALT: 25 U/L (ref 0–44)
AST: 34 U/L (ref 15–41)
Albumin: 3.7 g/dL (ref 3.5–5.0)
Alkaline Phosphatase: 64 U/L (ref 38–126)
Anion gap: 12 (ref 5–15)
BUN: 32 mg/dL — ABNORMAL HIGH (ref 8–23)
CO2: 24 mmol/L (ref 22–32)
Calcium: 9.1 mg/dL (ref 8.9–10.3)
Chloride: 94 mmol/L — ABNORMAL LOW (ref 98–111)
Creatinine, Ser: 1.37 mg/dL — ABNORMAL HIGH (ref 0.44–1.00)
GFR, Estimated: 41 mL/min — ABNORMAL LOW (ref >60)
Glucose, Bld: 153 mg/dL — ABNORMAL HIGH (ref 70–99)
Potassium: 4 mmol/L (ref 3.5–5.1)
Sodium: 130 mmol/L — ABNORMAL LOW (ref 135–145)
Total Bilirubin: 0.6 mg/dL (ref 0.3–1.2)
Total Protein: 6.9 g/dL (ref 6.5–8.1)

## 2022-12-29 LAB — IRON AND TIBC
Iron: 20 ug/dL — ABNORMAL LOW (ref 28–170)
Saturation Ratios: 6 % — ABNORMAL LOW (ref 10.4–31.8)
TIBC: 336 ug/dL (ref 250–450)
UIBC: 316 ug/dL

## 2022-12-29 LAB — CORTISOL: Cortisol, Plasma: 9.4 ug/dL

## 2022-12-29 LAB — TSH: TSH: 0.564 u[IU]/mL (ref 0.350–4.500)

## 2022-12-29 LAB — CBG MONITORING, ED: Glucose-Capillary: 114 mg/dL — ABNORMAL HIGH (ref 70–99)

## 2022-12-29 LAB — FOLATE: Folate: 18.3 ng/mL (ref >5.9)

## 2022-12-29 LAB — MAGNESIUM: Magnesium: 1.8 mg/dL (ref 1.7–2.4)

## 2022-12-29 LAB — FERRITIN: Ferritin: 33 ng/mL (ref 11–307)

## 2022-12-29 LAB — ETHANOL: Alcohol, Ethyl (B): <10 mg/dL (ref <10)

## 2022-12-29 LAB — GLUCOSE, CAPILLARY
Glucose-Capillary: 107 mg/dL — ABNORMAL HIGH (ref 70–99)
Glucose-Capillary: 100 mg/dL — ABNORMAL HIGH (ref 70–99)

## 2022-12-29 LAB — VITAMIN B12: Vitamin B-12: 354 pg/mL (ref 180–914)

## 2022-12-29 LAB — AMMONIA: Ammonia: 20 umol/L (ref 9–35)

## 2022-12-29 LAB — URINE CULTURE

## 2022-12-29 MED ORDER — PIPERACILLIN-TAZOBACTAM 3.375 G IVPB
3.3750 g | Freq: Three times a day (TID) | INTRAVENOUS | Status: DC
  Administered 2022-12-29 – 2022-12-30 (×2): 3.375 g via INTRAVENOUS
  Filled 2022-12-29 (×2): qty 50

## 2022-12-29 MED ORDER — NALOXONE HCL 0.4 MG/ML IJ SOLN
0.4000 mg | Freq: Once | INTRAMUSCULAR | Status: AC
  Administered 2022-12-29: 0.4 mg via INTRAVENOUS
  Filled 2022-12-29: qty 1

## 2022-12-29 MED ORDER — LEVOFLOXACIN IN D5W 750 MG/150ML IV SOLN: 750.0000 mg | INTRAVENOUS | Status: DC

## 2022-12-29 MED ORDER — PIPERACILLIN-TAZOBACTAM 3.375 G IVPB 30 MIN
3.3750 g | Freq: Once | INTRAVENOUS | Status: AC
  Administered 2022-12-29: 3.375 g via INTRAVENOUS
  Filled 2022-12-29: qty 50

## 2022-12-29 MED ORDER — HYDROCODONE-ACETAMINOPHEN 5-325 MG PO TABS: 1.0000 | ORAL_TABLET | Freq: Four times a day (QID) | ORAL | Status: DC | PRN

## 2022-12-29 MED ORDER — IOHEXOL 350 MG/ML SOLN
75.0000 mL | Freq: Once | INTRAVENOUS | Status: AC | PRN
  Administered 2022-12-29: 75 mL via INTRAVENOUS

## 2022-12-29 MED ORDER — INSULIN ASPART 100 UNIT/ML IJ SOLN
0.0000 [IU] | INTRAMUSCULAR | Status: DC
  Administered 2022-12-31 – 2023-01-01 (×4): 1 [IU] via SUBCUTANEOUS
  Administered 2023-01-01: 2 [IU] via SUBCUTANEOUS
  Administered 2023-01-01: 1 [IU] via SUBCUTANEOUS
  Administered 2023-01-02: 2 [IU] via SUBCUTANEOUS
  Administered 2023-01-03 (×2): 1 [IU] via SUBCUTANEOUS

## 2022-12-29 MED ORDER — SODIUM CHLORIDE 0.9 % IV SOLN: INTRAVENOUS | Status: DC

## 2022-12-29 MED ORDER — HYDROMORPHONE HCL 1 MG/ML IJ SOLN: 0.5000 mg | INTRAMUSCULAR | Status: DC | PRN

## 2022-12-29 MED ORDER — LACTATED RINGERS IV BOLUS
500.0000 mL | Freq: Once | INTRAVENOUS | Status: AC
  Administered 2022-12-29: 500 mL via INTRAVENOUS
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
## 2022-12-29 NOTE — ED Notes (Signed)
Please update family [phone]
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
## 2022-12-29 NOTE — Progress Notes (Addendum)
Some improvement in status after Narcan but still drowsy.  CT abdomen and pelvis without acute finding.  TSH, ammonia, cortisol within normal.  UA concerning for UTI.  Hgb trended down to 9.2.   Patient has history of cephalosporin allergy.  Cannot use Bactrim due to hyponatremia. Pharmacy reviewed med history.  Previously tolerated IV Zosyn.  Ordered Zosyn per pharmacy pending culture speciation and sensitivity.  Follow other encephalopathy labs.   Trend H&H.

## 2022-12-29 NOTE — ED Notes (Signed)
Pt transported to imaging.
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
## 2022-12-29 NOTE — ED Notes (Signed)
Pt transported to CT ?

## 2022-12-29 NOTE — Plan of Care (Signed)

## 2022-12-29 NOTE — Progress Notes (Signed)
Pharmacy Antibiotic Note  [name] is a 77 y.o. female for which pharmacy has been consulted for zosyn dosing for UTI. Patient with a history of cephalosporin intolerance. Chart review reveals tolerating zosyn numerous time.   SCr 1.37 WBC 6.9; T 99; HR 77; RR 15  Plan: Zosyn 3.375g IV q8h (4 hour infusion) Trend WBC, Fever, Renal function F/u cultures, clinical course, WBC De-escalate when able     Temp (24hrs), Avg:98.6 F (37 C), Min:98.2 F (36.8 C), Max:99 F (37.2 C)  Recent Labs  Lab 12/29/22 0920 12/29/22 1231  WBC 9.2 6.9  CREATININE 1.37*  --     CrCl cannot be calculated (Unknown ideal weight.).    Allergies  Allergen Reactions   Cefuroxime Other (See Comments)    Oral ulcers   Cephalexin Other (See Comments)    Took off first layer of skin inside of mouth   Chlorhexidine Other (See Comments)    Mouth broke out- oral ulcers   Prednisone Other (See Comments)    Possible oral ulcers   Zithromax [Azithromycin Dihydrate] Other (See Comments)    ORAL ULCERS    Microbiology results: Pending  Thank you for allowing pharmacy to be a part of this patient's care.  Delmar Landau, PharmD, BCPS 12/29/2022 3:06 PM ED Clinical Pharmacist -  [phone]

## 2022-12-29 NOTE — Progress Notes (Signed)
   12/29/22 0900  Spiritual Encounters  Type of Visit Initial  Care provided to: Pt and family  Conversation partners present during encounter Nurse  Referral source Trauma page  Reason for visit Trauma  OnCall Visit No   Ch responded to level 2. Pt's daughter was at the bedside. Ch established rapport and connectedness. No follow-up needed at this time.

## 2022-12-29 NOTE — Progress Notes (Signed)
SLP Cancellation Note  Patient Details Name: [name] MRN: [id number] DOB: Nov 28, 1945   Cancelled evaluation: Orders received for swallowing assessment; pt not sufficiently alert for participation; D/W RN. SLP will follow for readiness.  Mckenzee Beem L. Samson Frederic, MA CCC/SLP Clinical Specialist - Acute Care SLP Acute Rehabilitation Services Office number [phone]                                                                                                       Blenda Mounts Laurice 12/29/2022, 1:47 PM

## 2022-12-30 ENCOUNTER — Ambulatory Visit: Payer: Medicare Other | Admitting: Podiatry

## 2022-12-30 DIAGNOSIS — G9340 Encephalopathy, unspecified: Secondary | ICD-10-CM | POA: Diagnosis not present

## 2022-12-30 DIAGNOSIS — S7291XA Unspecified fracture of right femur, initial encounter for closed fracture: Secondary | ICD-10-CM | POA: Diagnosis not present

## 2022-12-30 DIAGNOSIS — W19XXXA Unspecified fall, initial encounter: Secondary | ICD-10-CM | POA: Diagnosis not present

## 2022-12-30 DIAGNOSIS — Z96649 Presence of unspecified artificial hip joint: Secondary | ICD-10-CM

## 2022-12-30 DIAGNOSIS — T84019A Broken internal joint prosthesis, unspecified site, initial encounter: Secondary | ICD-10-CM | POA: Diagnosis not present

## 2022-12-30 LAB — COMPREHENSIVE METABOLIC PANEL
ALT: 21 U/L (ref 0–44)
AST: 28 U/L (ref 15–41)
Albumin: 2.8 g/dL — ABNORMAL LOW (ref 3.5–5.0)
Alkaline Phosphatase: 56 U/L (ref 38–126)
Anion gap: 8 (ref 5–15)
BUN: 13 mg/dL (ref 8–23)
CO2: 26 mmol/L (ref 22–32)
Calcium: 8.2 mg/dL — ABNORMAL LOW (ref 8.9–10.3)
Chloride: 100 mmol/L (ref 98–111)
Creatinine, Ser: 0.75 mg/dL (ref 0.44–1.00)
GFR, Estimated: >60 mL/min (ref >60)
Glucose, Bld: 110 mg/dL — ABNORMAL HIGH (ref 70–99)
Potassium: 3.1 mmol/L — ABNORMAL LOW (ref 3.5–5.1)
Sodium: 134 mmol/L — ABNORMAL LOW (ref 135–145)
Total Bilirubin: 1 mg/dL (ref 0.3–1.2)
Total Protein: 5.6 g/dL — ABNORMAL LOW (ref 6.5–8.1)

## 2022-12-30 LAB — CBC
HCT: 29.5 % — ABNORMAL LOW (ref 36.0–46.0)
Hemoglobin: 10.3 g/dL — ABNORMAL LOW (ref 12.0–15.0)
MCH: 31.1 pg (ref 26.0–34.0)
MCHC: 34.9 g/dL (ref 30.0–36.0)
MCV: 89.1 fL (ref 80.0–100.0)
Platelets: 157 10*3/uL (ref 150–400)
RBC: 3.31 MIL/uL — ABNORMAL LOW (ref 3.87–5.11)
RDW: 14 % (ref 11.5–15.5)
WBC: 8.7 10*3/uL (ref 4.0–10.5)
nRBC: 0 % (ref 0.0–0.2)

## 2022-12-30 LAB — RPR: RPR Ser Ql: NONREACTIVE

## 2022-12-30 LAB — HEMOGLOBIN AND HEMATOCRIT, BLOOD
HCT: 29.6 % — ABNORMAL LOW (ref 36.0–46.0)
HCT: 31.4 % — ABNORMAL LOW (ref 36.0–46.0)
Hemoglobin: 10.3 g/dL — ABNORMAL LOW (ref 12.0–15.0)
Hemoglobin: 10.6 g/dL — ABNORMAL LOW (ref 12.0–15.0)

## 2022-12-30 LAB — GLUCOSE, CAPILLARY
Glucose-Capillary: 104 mg/dL — ABNORMAL HIGH (ref 70–99)
Glucose-Capillary: 105 mg/dL — ABNORMAL HIGH (ref 70–99)
Glucose-Capillary: 116 mg/dL — ABNORMAL HIGH (ref 70–99)
Glucose-Capillary: 121 mg/dL — ABNORMAL HIGH (ref 70–99)
Glucose-Capillary: 71 mg/dL (ref 70–99)
Glucose-Capillary: 96 mg/dL (ref 70–99)

## 2022-12-30 LAB — MAGNESIUM: Magnesium: 1.7 mg/dL (ref 1.7–2.4)

## 2022-12-30 MED ORDER — OXYCODONE HCL 5 MG PO TABS
5.0000 mg | ORAL_TABLET | Freq: Four times a day (QID) | ORAL | Status: DC | PRN
  Administered 2022-12-30: 5 mg via ORAL
  Filled 2022-12-30: qty 1

## 2022-12-30 MED ORDER — ADULT MULTIVITAMIN W/MINERALS CH
1.0000 | ORAL_TABLET | Freq: Every day | ORAL | Status: DC
  Administered 2022-12-30 – 2023-01-06 (×8): 1 via ORAL
  Filled 2022-12-30 (×8): qty 1

## 2022-12-30 MED ORDER — ASPIRIN 81 MG PO TBEC
81.0000 mg | DELAYED_RELEASE_TABLET | Freq: Every day | ORAL | Status: DC
  Administered 2022-12-30 – 2023-01-06 (×8): 81 mg via ORAL
  Filled 2022-12-30 (×8): qty 1

## 2022-12-30 MED ORDER — NALOXONE HCL 0.4 MG/ML IJ SOLN: 0.4000 mg | INTRAMUSCULAR | Status: DC | PRN

## 2022-12-30 MED ORDER — ENOXAPARIN SODIUM 30 MG/0.3ML IJ SOSY
30.0000 mg | PREFILLED_SYRINGE | INTRAMUSCULAR | Status: DC
  Administered 2022-12-30 – 2023-01-06 (×8): 30 mg via SUBCUTANEOUS
  Filled 2022-12-30 (×8): qty 0.3

## 2022-12-30 MED ORDER — AMLODIPINE BESYLATE 10 MG PO TABS
10.0000 mg | ORAL_TABLET | Freq: Every day | ORAL | Status: DC
  Administered 2022-12-30 – 2023-01-06 (×8): 10 mg via ORAL
  Filled 2022-12-30 (×8): qty 1

## 2022-12-30 MED ORDER — ENSURE ENLIVE PO LIQD
237.0000 mL | Freq: Two times a day (BID) | ORAL | Status: DC
  Administered 2022-12-30 – 2023-01-06 (×13): 237 mL via ORAL

## 2022-12-30 MED ORDER — METHOCARBAMOL 500 MG PO TABS
500.0000 mg | ORAL_TABLET | Freq: Every day | ORAL | Status: DC | PRN
  Administered 2023-01-03 – 2023-01-05 (×3): 500 mg via ORAL
  Filled 2022-12-30 (×4): qty 1

## 2022-12-30 MED ORDER — MAGNESIUM SULFATE 2 GM/50ML IV SOLN
2.0000 g | Freq: Once | INTRAVENOUS | Status: AC
  Administered 2022-12-30: 2 g via INTRAVENOUS
  Filled 2022-12-30: qty 50

## 2022-12-30 MED ORDER — ROSUVASTATIN CALCIUM 5 MG PO TABS
10.0000 mg | ORAL_TABLET | Freq: Every day | ORAL | Status: DC
  Administered 2022-12-30 – 2023-01-06 (×8): 10 mg via ORAL
  Filled 2022-12-30 (×4): qty 2
  Filled 2022-12-30 (×2): qty 1
  Filled 2022-12-30 (×2): qty 2
  Filled 2022-12-30: qty 1

## 2022-12-30 MED ORDER — IPRATROPIUM-ALBUTEROL 0.5-2.5 (3) MG/3ML IN SOLN
3.0000 mL | Freq: Four times a day (QID) | RESPIRATORY_TRACT | Status: DC | PRN
  Administered 2022-12-31: 3 mL via RESPIRATORY_TRACT

## 2022-12-30 MED ORDER — MORPHINE SULFATE (PF) 2 MG/ML IV SOLN
2.0000 mg | INTRAVENOUS | Status: DC | PRN
  Administered 2022-12-30 – 2023-01-06 (×19): 2 mg via INTRAVENOUS
  Filled 2022-12-30 (×21): qty 1

## 2022-12-30 MED ORDER — GABAPENTIN 100 MG PO CAPS
100.0000 mg | ORAL_CAPSULE | Freq: Two times a day (BID) | ORAL | Status: DC
  Administered 2022-12-30 – 2023-01-06 (×15): 100 mg via ORAL
  Filled 2022-12-30 (×15): qty 1

## 2022-12-30 MED ORDER — ACETAMINOPHEN 500 MG PO TABS
1000.0000 mg | ORAL_TABLET | Freq: Three times a day (TID) | ORAL | Status: DC | PRN
  Administered 2022-12-30 – 2023-01-05 (×6): 1000 mg via ORAL
  Filled 2022-12-30 (×7): qty 2

## 2022-12-30 MED ORDER — OXYCODONE HCL 5 MG PO TABS
5.0000 mg | ORAL_TABLET | Freq: Four times a day (QID) | ORAL | Status: DC | PRN
  Administered 2022-12-30 – 2023-01-02 (×9): 10 mg via ORAL
  Administered 2023-01-02: 5 mg via ORAL
  Administered 2023-01-02 – 2023-01-06 (×11): 10 mg via ORAL
  Filled 2022-12-30 (×14): qty 2
  Filled 2022-12-30: qty 1
  Filled 2022-12-30 (×7): qty 2

## 2022-12-30 MED ORDER — POTASSIUM CHLORIDE CRYS ER 20 MEQ PO TBCR
40.0000 meq | EXTENDED_RELEASE_TABLET | ORAL | Status: AC
  Administered 2022-12-30 (×2): 40 meq via ORAL
  Filled 2022-12-30 (×2): qty 2

## 2022-12-30 MED ORDER — CLOPIDOGREL BISULFATE 75 MG PO TABS
75.0000 mg | ORAL_TABLET | Freq: Every day | ORAL | Status: DC
  Administered 2022-12-30 – 2023-01-06 (×8): 75 mg via ORAL
  Filled 2022-12-30 (×8): qty 1

## 2022-12-30 NOTE — Progress Notes (Signed)
Dr. Loney Loh was made aware that I was called by monitor tech that pt has 19 beat SVT. Pt denied CP & SOB, skin warm & dry. BP=115/58. HR is now 81. Check chart review for strip.
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
## 2022-12-30 NOTE — Progress Notes (Signed)
Dr Loney Loh called to see how pt's pain was. I told her pt was sleeping. Dr. Loney Loh said to have day team follow up with pain management for pt.

## 2022-12-30 NOTE — Progress Notes (Signed)
Dr. Loney Loh was made aware that EKG was done and uploaded to media.

## 2022-12-30 NOTE — Progress Notes (Signed)
Initial Nutrition Assessment  DOCUMENTATION CODES:   Severe malnutrition in context of chronic illness  INTERVENTION:   Multivitamin w/ minerals daily Ensure Enlive po BID, each supplement provides 350 kcal and 20 grams of protein. Liberalize pt diet to regular due to malnutrition and poor PO intake during admission.  Encourage good PO intake   NUTRITION DIAGNOSIS:   Severe Malnutrition related to chronic illness as evidenced by severe muscle depletion, severe fat depletion.  GOAL:   Patient will meet greater than or equal to 90% of their needs  MONITOR:   Supplement acceptance, PO intake, Labs, Weight trends, I & O's  REASON FOR ASSESSMENT:   Consult Assessment of nutrition requirement/status, Hip fracture protocol  ASSESSMENT:   77 y.o. female presented to the ED with AMS, lethargy, and weakness. PMH includes CVA, CAD, COPD, T2DM, HTN, dysphagia, and breast cancer. Pt admitted with closed R proximal femoral fracture and acute encephalopathy.   No plans for surgery per Ortho's note.   Pt laying in bed, reports that she did not eat breakfast this morning and RD observed lunch tray in room, untouched. Pt reports that she does not want it and is not hungry. Pt reports a good appetite PTA, typically eating 3 meals per day + 1 Ensure. Pt denies any N/V/D or constipation. Denies any difficulty chewing or swallowing. Unsure of UBW and denies any recent weight changes. Reports use of a cane to ambulate at baseline. Pt meets criteria for severe malnutrition based on physical exam. Pt with no new weight his admission, asked RN to obtain.  Pt agreeable to RD ordering Ensure while in the hospital. Discussed with RN.   Medications reviewed and include: NovoLog SSI, Potassium Chloride, IV antibiotics   Labs reviewed: Sodium 134, Potassium 3.1, Magnesium 1.7, Hgb A1c 5.4, 24 hr CBGs 96-116  NUTRITION - FOCUSED PHYSICAL EXAM:  Flowsheet Row Most Recent Value  Orbital Region Severe  depletion  Upper Arm Region Severe depletion  Thoracic and Lumbar Region Severe depletion  Buccal Region Severe depletion  Temple Region Severe depletion  Clavicle Bone Region Severe depletion  Clavicle and Acromion Bone Region Severe depletion  Scapular Bone Region Severe depletion  Dorsal Hand Unable to assess  Patellar Region Severe depletion  Anterior Thigh Region Severe depletion  Posterior Calf Region Moderate depletion  Edema (RD Assessment) Mild  Hair Reviewed  Eyes Unable to assess  Mouth Unable to assess  Skin Reviewed  Nails Unable to assess   Diet Order:   Diet Order             Diet regular Room service appropriate? Yes with Assist; Fluid consistency: Thin  Diet effective now                  EDUCATION NEEDS:   Not appropriate for education at this time  Skin:  Skin Assessment: Reviewed RN Assessment  Last BM:  4/15  Height:  Ht Readings from Last 1 Encounters:  12/07/22 5\' 2"  (1.575 m)   Weight:  Wt Readings from Last 1 Encounters:  12/07/22 54.5 kg   Ideal Body Weight:  50 kg  BMI:  There is no height or weight on file to calculate BMI.  Estimated Nutritional Needs:  Kcal:  1700-1900 Protein:  85-100 grams Fluid:  >/= 1.7 L   Kirby Crigler RD, LDN Clinical Dietitian See [facility] for contact information.
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
## 2022-12-30 NOTE — Progress Notes (Signed)
Pt is A&OX4. Swallow screening is repeated, and pt passed. Tylenol 1000mg  given by mouth.
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
## 2022-12-30 NOTE — Progress Notes (Signed)
Patient was found to have purple, green, and blue pills in her denture container in her room. Patient stated "those are my pain meds." Per security, unmarked pills have to be disposed of. Patient educated that she cannot have home medication in room, especially not in original containers.  Patient was reluctant but agreed to let staff dispose of medication. Medication disposed of in cactus in med room.

## 2022-12-30 NOTE — Progress Notes (Signed)
  Transition of Care [facility]) Screening Note   Patient Details  Name: [name] Date of Birth: [birth year]   [facility]) CM/SW Contact:    Mearl Latin, LCSW Phone Number: 12/30/2022, 9:25 AM    Transition of Care Department [facility]) has reviewed patient from home with spouse. She went last month to [facility] for rehab. CSW will find out how many Medicare days she utilized. We will continue to monitor patient advancement through interdisciplinary progression rounds. If new patient transition needs arise, please place a TOC consult.
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
## 2022-12-30 NOTE — Plan of Care (Signed)
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
## 2022-12-31 ENCOUNTER — Inpatient Hospital Stay (HOSPITAL_COMMUNITY): Payer: Medicare Other

## 2022-12-31 DIAGNOSIS — J9811 Atelectasis: Secondary | ICD-10-CM | POA: Diagnosis not present

## 2022-12-31 DIAGNOSIS — G9340 Encephalopathy, unspecified: Secondary | ICD-10-CM

## 2022-12-31 DIAGNOSIS — J690 Pneumonitis due to inhalation of food and vomit: Secondary | ICD-10-CM | POA: Diagnosis not present

## 2022-12-31 DIAGNOSIS — J9601 Acute respiratory failure with hypoxia: Secondary | ICD-10-CM | POA: Diagnosis not present

## 2022-12-31 DIAGNOSIS — T84019A Broken internal joint prosthesis, unspecified site, initial encounter: Secondary | ICD-10-CM | POA: Diagnosis not present

## 2022-12-31 DIAGNOSIS — W19XXXA Unspecified fall, initial encounter: Secondary | ICD-10-CM | POA: Diagnosis not present

## 2022-12-31 DIAGNOSIS — S7291XA Unspecified fracture of right femur, initial encounter for closed fracture: Secondary | ICD-10-CM | POA: Diagnosis not present

## 2022-12-31 LAB — CBC
HCT: 29 % — ABNORMAL LOW (ref 36.0–46.0)
Hemoglobin: 10 g/dL — ABNORMAL LOW (ref 12.0–15.0)
MCH: 30.9 pg (ref 26.0–34.0)
MCHC: 34.5 g/dL (ref 30.0–36.0)
MCV: 89.5 fL (ref 80.0–100.0)
Platelets: 176 10*3/uL (ref 150–400)
RBC: 3.24 MIL/uL — ABNORMAL LOW (ref 3.87–5.11)
RDW: 13.9 % (ref 11.5–15.5)
WBC: 7 10*3/uL (ref 4.0–10.5)
nRBC: 0 % (ref 0.0–0.2)

## 2022-12-31 LAB — COMPREHENSIVE METABOLIC PANEL
ALT: 17 U/L (ref 0–44)
AST: 21 U/L (ref 15–41)
Albumin: 2.4 g/dL — ABNORMAL LOW (ref 3.5–5.0)
Alkaline Phosphatase: 55 U/L (ref 38–126)
Anion gap: 6 (ref 5–15)
BUN: 11 mg/dL (ref 8–23)
CO2: 27 mmol/L (ref 22–32)
Calcium: 8.3 mg/dL — ABNORMAL LOW (ref 8.9–10.3)
Chloride: 103 mmol/L (ref 98–111)
Creatinine, Ser: 0.65 mg/dL (ref 0.44–1.00)
GFR, Estimated: >60 mL/min (ref >60)
Glucose, Bld: 91 mg/dL (ref 70–99)
Potassium: 3.7 mmol/L (ref 3.5–5.1)
Sodium: 136 mmol/L (ref 135–145)
Total Bilirubin: 0.5 mg/dL (ref 0.3–1.2)
Total Protein: 5.4 g/dL — ABNORMAL LOW (ref 6.5–8.1)

## 2022-12-31 LAB — TROPONIN I (HIGH SENSITIVITY)
Troponin I (High Sensitivity): 16 ng/L (ref <18)
Troponin I (High Sensitivity): 13 ng/L (ref <18)

## 2022-12-31 LAB — GLUCOSE, CAPILLARY
Glucose-Capillary: 91 mg/dL (ref 70–99)
Glucose-Capillary: 92 mg/dL (ref 70–99)
Glucose-Capillary: 93 mg/dL (ref 70–99)
Glucose-Capillary: 197 mg/dL — ABNORMAL HIGH (ref 70–99)
Glucose-Capillary: 180 mg/dL — ABNORMAL HIGH (ref 70–99)

## 2022-12-31 LAB — URINE CULTURE

## 2022-12-31 LAB — MAGNESIUM: Magnesium: 2.1 mg/dL (ref 1.7–2.4)

## 2022-12-31 LAB — BRAIN NATRIURETIC PEPTIDE: B Natriuretic Peptide: 391.2 pg/mL — ABNORMAL HIGH (ref 0.0–100.0)

## 2022-12-31 MED ORDER — GUAIFENESIN ER 600 MG PO TB12
600.0000 mg | ORAL_TABLET | Freq: Two times a day (BID) | ORAL | Status: DC
  Administered 2022-12-31 – 2023-01-06 (×13): 600 mg via ORAL
  Filled 2022-12-31 (×13): qty 1

## 2022-12-31 MED ORDER — NITROGLYCERIN 0.4 MG SL SUBL
0.4000 mg | SUBLINGUAL_TABLET | SUBLINGUAL | Status: DC | PRN
  Administered 2022-12-31: 0.4 mg via SUBLINGUAL
  Filled 2022-12-31: qty 1

## 2022-12-31 MED ORDER — IOHEXOL 350 MG/ML SOLN
125.0000 mL | Freq: Once | INTRAVENOUS | Status: AC | PRN
  Administered 2022-12-31: 125 mL via INTRAVENOUS

## 2022-12-31 MED ORDER — VANCOMYCIN 50 MG/ML ORAL SOLUTION: 125.0000 mg | Freq: Four times a day (QID) | ORAL | Status: DC

## 2022-12-31 MED ORDER — SODIUM CHLORIDE 3 % IN NEBU
4.0000 mL | INHALATION_SOLUTION | Freq: Four times a day (QID) | RESPIRATORY_TRACT | Status: DC
  Administered 2022-12-31 – 2023-01-05 (×13): 4 mL via RESPIRATORY_TRACT
  Filled 2022-12-31 (×23): qty 4

## 2022-12-31 MED ORDER — SODIUM CHLORIDE 3 % IN NEBU
4.0000 mL | INHALATION_SOLUTION | Freq: Four times a day (QID) | RESPIRATORY_TRACT | Status: DC
  Filled 2022-12-31: qty 4

## 2022-12-31 MED ORDER — SODIUM CHLORIDE 0.9 % IV SOLN
3.0000 g | Freq: Three times a day (TID) | INTRAVENOUS | Status: DC
  Administered 2022-12-31 – 2023-01-04 (×13): 3 g via INTRAVENOUS
  Filled 2022-12-31 (×13): qty 8

## 2022-12-31 MED ORDER — VANCOMYCIN HCL 125 MG PO CAPS
125.0000 mg | ORAL_CAPSULE | Freq: Every day | ORAL | Status: DC
  Administered 2022-12-31 – 2023-01-06 (×7): 125 mg via ORAL
  Filled 2022-12-31 (×8): qty 1

## 2022-12-31 MED ORDER — SODIUM CHLORIDE 3 % IN NEBU
4.0000 mL | INHALATION_SOLUTION | Freq: Two times a day (BID) | RESPIRATORY_TRACT | Status: DC
  Administered 2022-12-31: 4 mL via RESPIRATORY_TRACT
  Filled 2022-12-31 (×2): qty 4

## 2022-12-31 MED ORDER — IPRATROPIUM-ALBUTEROL 0.5-2.5 (3) MG/3ML IN SOLN
3.0000 mL | Freq: Four times a day (QID) | RESPIRATORY_TRACT | Status: DC
  Administered 2022-12-31 – 2023-01-03 (×12): 3 mL via RESPIRATORY_TRACT
  Filled 2022-12-31 (×14): qty 3

## 2022-12-31 NOTE — Progress Notes (Signed)
Pharmacy Antibiotic Note  LATRESSA BANGE is a 77 y.o. female for which pharmacy has been consulted for zosyn dosing for UTI. Patient with a history of cephalosporin intolerance. Chart review reveals tolerating zosyn numerous time.   Plan to restart abx with unasyn to cover for asp PNA.   Scr<1  Plan: Unasyn 3g IV q8    Temp (24hrs), Avg:98.2 F (36.8 C), Min:97.8 F (36.6 C), Max:98.7 F (37.1 C)  Recent Labs  Lab 12/29/22 0920 12/29/22 1231 12/30/22 0704 12/31/22 0343  WBC 9.2 6.9 8.7 7.0  CREATININE 1.37*  --  0.75 0.65     CrCl cannot be calculated (Unknown ideal weight.).    Allergies  Allergen Reactions   Cefuroxime Other (See Comments)    Oral ulcers   Cephalexin Other (See Comments)    Took off first layer of skin inside of mouth   Chlorhexidine Other (See Comments)    Mouth broke out- oral ulcers   Prednisone Other (See Comments)    Possible oral ulcers   Zithromax [Azithromycin Dihydrate] Other (See Comments)    ORAL ULCERS    Microbiology results: 4/15 zosyn>>4/16 4/17 Unasyn>>   4/15 urine>> e.coli pan sens except amp/unasyn  Ulyses Southward, PharmD, BCIDP, AAHIVP, CPP Infectious Disease Pharmacist 12/31/2022 1:27 PM

## 2022-12-31 NOTE — TOC CAGE-AID Note (Signed)
Transition of Care [facility]) - CAGE-AID Screening   Patient Details  Name: [name] MRN: [id number] Date of Birth: [birth year]  Transition of Care [facility]) CM/SW Contact:    Leota Sauers, RN Phone Number: 12/31/2022, 5:54 AM   Clinical Narrative:  Patient denies use of alcohol and drugs. Education not offered at this time.  CAGE-AID Screening:    Have You Ever Felt You Ought to Cut Down on Your Drinking or Drug Use?: No Have People Annoyed You By Critizing Your Drinking Or Drug Use?: No Have You Felt Bad Or Guilty About Your Drinking Or Drug Use?: No Have You Ever Had a Drink or Used Drugs First Thing In The Morning to Steady Your Nerves or to Get Rid of a Hangover?: No CAGE-AID Score: 0  Substance Abuse Education Offered: No
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
## 2023-01-01 ENCOUNTER — Inpatient Hospital Stay (HOSPITAL_COMMUNITY): Payer: Medicare Other

## 2023-01-01 DIAGNOSIS — W19XXXA Unspecified fall, initial encounter: Secondary | ICD-10-CM | POA: Diagnosis not present

## 2023-01-01 DIAGNOSIS — G9340 Encephalopathy, unspecified: Secondary | ICD-10-CM | POA: Diagnosis not present

## 2023-01-01 DIAGNOSIS — S7291XA Unspecified fracture of right femur, initial encounter for closed fracture: Secondary | ICD-10-CM | POA: Diagnosis not present

## 2023-01-01 DIAGNOSIS — T84019A Broken internal joint prosthesis, unspecified site, initial encounter: Secondary | ICD-10-CM | POA: Diagnosis not present

## 2023-01-01 DIAGNOSIS — J9811 Atelectasis: Secondary | ICD-10-CM | POA: Diagnosis not present

## 2023-01-01 LAB — CBC
HCT: 33.6 % — ABNORMAL LOW (ref 36.0–46.0)
Hemoglobin: 11.2 g/dL — ABNORMAL LOW (ref 12.0–15.0)
MCH: 30.5 pg (ref 26.0–34.0)
MCHC: 33.3 g/dL (ref 30.0–36.0)
MCV: 91.6 fL (ref 80.0–100.0)
Platelets: 208 10*3/uL (ref 150–400)
RBC: 3.67 MIL/uL — ABNORMAL LOW (ref 3.87–5.11)
RDW: 13.9 % (ref 11.5–15.5)
WBC: 8.4 10*3/uL (ref 4.0–10.5)
nRBC: 0 % (ref 0.0–0.2)

## 2023-01-01 LAB — BASIC METABOLIC PANEL
Anion gap: 12 (ref 5–15)
BUN: 10 mg/dL (ref 8–23)
CO2: 25 mmol/L (ref 22–32)
Calcium: 8.3 mg/dL — ABNORMAL LOW (ref 8.9–10.3)
Chloride: 98 mmol/L (ref 98–111)
Creatinine, Ser: 0.62 mg/dL (ref 0.44–1.00)
GFR, Estimated: >60 mL/min (ref >60)
Glucose, Bld: 185 mg/dL — ABNORMAL HIGH (ref 70–99)
Potassium: 3.2 mmol/L — ABNORMAL LOW (ref 3.5–5.1)
Sodium: 135 mmol/L (ref 135–145)

## 2023-01-01 LAB — GLUCOSE, CAPILLARY
Glucose-Capillary: 106 mg/dL — ABNORMAL HIGH (ref 70–99)
Glucose-Capillary: 118 mg/dL — ABNORMAL HIGH (ref 70–99)
Glucose-Capillary: 153 mg/dL — ABNORMAL HIGH (ref 70–99)
Glucose-Capillary: 157 mg/dL — ABNORMAL HIGH (ref 70–99)
Glucose-Capillary: 179 mg/dL — ABNORMAL HIGH (ref 70–99)
Glucose-Capillary: 224 mg/dL — ABNORMAL HIGH (ref 70–99)

## 2023-01-01 LAB — SARS CORONAVIRUS 2 BY RT PCR: SARS Coronavirus 2 by RT PCR: NEGATIVE

## 2023-01-01 MED ORDER — POTASSIUM CHLORIDE 20 MEQ PO PACK
40.0000 meq | PACK | Freq: Four times a day (QID) | ORAL | Status: AC
  Administered 2023-01-01 (×2): 40 meq via ORAL
  Filled 2023-01-01 (×2): qty 2

## 2023-01-01 NOTE — Progress Notes (Signed)
RT attempted CPT (vest) and only tolerated 4 minutes of CPT. Pt states it hurts her back. Performed Flutter valve this round instead.

## 2023-01-01 NOTE — Anesthesia Preprocedure Evaluation (Addendum)
Anesthesia Evaluation  Patient identified by MRN, date of birth, ID band Patient awake    Reviewed: Allergy & Precautions, H&P , NPO status , Patient's Chart, lab work & pertinent test results  Airway Mallampati: III  TM Distance: >3 FB Neck ROM: Full    Dental  (+) Edentulous Upper, Edentulous Lower   Pulmonary asthma , COPD,  COPD inhaler, Current Smoker and Patient abstained from smoking. 50 pack year history COPD: albuterol PRN and dulera   breath sounds clear to auscultation + decreased breath sounds      Cardiovascular hypertension, Pt. on medications + CAD, + Past MI and + Orthopnea  + dysrhythmias Atrial Fibrillation  Rhythm:Regular Rate:Normal  Echo 08/2022  1. Left ventricular ejection fraction, by estimation, is 55 to 60%. The left ventricle has normal function. The left ventricle has no regional wall motion abnormalities. There is mild left ventricular hypertrophy. Left ventricular diastolic parameters are consistent with Grade I diastolic dysfunction (impaired relaxation).   2. Right ventricular systolic function is normal. The right ventricular size is normal.   3. Left atrial size was mildly dilated.   4. The mitral valve is abnormal. Trivial mitral valve regurgitation. No evidence of mitral stenosis. Moderate mitral annular calcification.   5. The aortic valve is tricuspid. There is mild calcification of the aortic valve. Aortic valve regurgitation is not visualized. Aortic valve sclerosis is present, with no evidence of aortic valve stenosis.   6. The inferior vena cava is normal in size with greater than 50% respiratory variability, suggesting right atrial pressure of 3 mmHg.    Stress MPS 2022   The study is normal. The study is low risk.   No ST deviation was noted.   Left ventricular function is normal. Nuclear stress EF: 58 %. The left ventricular ejection fraction is normal (55-65%). End diastolic cavity size  is normal.   Prior study available for comparison from 07/27/2014.   Low risk stress nuclear study with normal perfusion and normal left ventricular regional and global systolic function.     Echo 2015: - Left ventricle: The cavity size was normal. Wall thickness was    increased in a pattern of mild LVH. Systolic function was normal.    The estimated ejection fraction was in the range of 60% to 65%.  - Mitral valve: Poorly visualized in apical views. MAC wtih    restricted posterior leaflet motion and likely moderate MR.  - Left atrium: The atrium was mildly dilated.  - Pulmonary arteries: PA peak pressure: 39 mm Hg (S).  - Pericardium, extracardiac: Likely large epicardial fat pad and    small apical effusion Consider CT to further assess pericardial    space.  - Impressions: Overall image quality is poor with non diagnostic    apical views.    Neuro/Psych  PSYCHIATRIC DISORDERS Anxiety     CVA    GI/Hepatic negative GI ROS,,,(+)     substance abuse  alcohol use  Endo/Other  diabetes (denies)    Renal/GU Renal disease     Musculoskeletal  (+) Arthritis , Osteoarthritis,  narcotic dependentChronic LBP   Abdominal Normal abdominal exam  (+)   Peds  Hematology  (+) Blood dyscrasia, anemia   Anesthesia Other Findings S/p fall while gardening now with R femoral shaft fx  Reproductive/Obstetrics negative OB ROS Breast ca s/p R mastectomy  Anesthesia Physical Anesthesia Plan  ASA: 3  Anesthesia Plan: General   Post-op Pain Management: Minimal or no pain anticipated   Induction: Intravenous  PONV Risk Score and Plan: 3 and Ondansetron, Dexamethasone and Treatment may vary due to age or medical condition  Airway Management Planned: Oral ETT  Additional Equipment:   Intra-op Plan:   Post-operative Plan: Possible Post-op intubation/ventilation  Informed Consent: I have reviewed the patients History and  Physical, chart, labs and discussed the procedure including the risks, benefits and alternatives for the proposed anesthesia with the patient or authorized representative who has indicated his/her understanding and acceptance.     Dental advisory given  Plan Discussed with: CRNA  Anesthesia Plan Comments:          Anesthesia Quick Evaluation

## 2023-01-01 NOTE — Progress Notes (Signed)
Pt ate some of her breakfast this morning.  Needs to be NPO for at least 8 hours prior to bronchoscopy.  Will need to schedule bronchoscopy for morning of 01/02/23 instead.  Coralyn Helling, MD [facility] Pulmonary/Critical Care Pager - [phone] or [phone] 01/01/2023, 9:07 AM
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
## 2023-01-01 NOTE — Consult Note (Addendum)
NAME:  [name], MRN:  [id number], DOB:  07-Jun-1946, LOS: 3 ADMISSION DATE:  12/29/2022, CONSULTATION DATE:  4/17 REFERRING MD:  Dr. Jerral Ralph, CHIEF COMPLAINT:  ARF w/ hypoxia   History of Present Illness:  77 yo female with chronic pain seems to have an accidental opiate overdose. She had altered mental status and fell. This resulted in fracture of her Rt prosthetic hip. She developed Lt sided chest pain and worsening hypoxia. CT chest showed collapse of LLL. She has weak cough from chest pain.   Pertinent  Medical History  Asthma/COPD, Breast cancer, CAD, Esophageal diverticulum, ETOH, A fib, HLD, HTN, Arthritis  Significant Hospital Events: Including procedures, antibiotic start and stop dates in addition to other pertinent events   4/15 admitted w/ encephalopathy  4/17 worsening hypoxia; LLL collapse; pccm consulted  Interim History / Subjective:  Has weak cough.  Still has pleuritic pain on Lt side.  Objective   Blood pressure 130/67, pulse 80, temperature 98 F (36.7 C), temperature source Axillary, resp. rate (!) 22, SpO2 93 %.        Intake/Output Summary (Last 24 hours) at 01/01/2023 0843 Last data filed at 01/01/2023 0428 Gross per 24 hour  Intake 317 ml  Output 1400 ml  Net -1083 ml   There were no vitals filed for this visit.  Examination:  General - alert, frail Eyes - pupils reactive ENT - no sinus tenderness, no stridor Cardiac - regular rate/rhythm, no murmur Chest - diminished breath sounds on Lt Abdomen - soft, non tender, + bowel sounds Extremities - no cyanosis, clubbing, or edema Skin - no rashes Neuro - normal strength, moves extremities, follows commands  Assessment & Plan:   Acute hypoxic respiratory failure from aspiration pneumonitis with Lt lower lung collapse. Hx of COPD/asthma. - CXR looks worse on 4/18 - will need bronchoscopy with removal of mucus plugging - procedure discussed with pt; risks detailed as bleeding, pneumothorax,  infection, and respiratory failure - she is agreeable to proceed with bronchoscopy - f/u CXR after procedure - continue supplemental oxygen to keep SpO2 > 90% - continue chest PT, flutter valve, nebulizer therapy - will need COVID testing prior to getting bronchoscopy done per endo protocol >> order placed for swab - continue ABx  Rt hip periprosthetic fracture. Chronic pain. Hx of CVA. HLD. HTN. DM type 2. Hx of C diff. - per primary team   Labs       Latest Ref Rng & Units 01/01/2023    7:31 AM 12/31/2022    3:43 AM 12/30/2022    7:04 AM  CMP  Glucose 70 - 99 mg/dL 168  91  372   BUN 8 - 23 mg/dL 10  11  13    Creatinine 0.44 - 1.00 mg/dL 9.02  1.11  5.52   Sodium 135 - 145 mmol/L 135  136  134   Potassium 3.5 - 5.1 mmol/L 3.2  3.7  3.1   Chloride 98 - 111 mmol/L 98  103  100   CO2 22 - 32 mmol/L 25  27  26    Calcium 8.9 - 10.3 mg/dL 8.3  8.3  8.2   Total Protein 6.5 - 8.1 g/dL  5.4  5.6   Total Bilirubin 0.3 - 1.2 mg/dL  0.5  1.0   Alkaline Phos 38 - 126 U/L  55  56   AST 15 - 41 U/L  21  28   ALT 0 - 44 U/L  17  21  Latest Ref Rng & Units 01/01/2023    7:31 AM 12/31/2022    3:43 AM 12/30/2022    9:04 AM  CBC  WBC 4.0 - 10.5 K/uL 8.4  7.0    Hemoglobin 12.0 - 15.0 g/dL 16.1  09.6  04.5   Hematocrit 36.0 - 46.0 % 33.6  29.0  29.6   Platelets 150 - 400 K/uL 208  176     CBG (last 3)  Recent Labs    12/31/22 2030 01/01/23 0337 01/01/23 0815  GLUCAP 180* 118* 224*    Signature:  Coralyn Helling, MD [facility] Pager - [phone] or [phone] 01/01/2023, 8:52 AM

## 2023-01-01 NOTE — Care Management Important Message (Signed)
Important Message  Patient Details  Name: [name] MRN: [id number] Date of Birth: 01-12-1946   Medicare Important Message Given:  Yes     Dorena Bodo 01/01/2023, 2:31 PM

## 2023-01-02 ENCOUNTER — Encounter (HOSPITAL_COMMUNITY)
Admission: EM | Disposition: A | Discharge status: Skilled Nursing Facility | Payer: Self-pay | Source: Home / Self Care | Attending: Internal Medicine

## 2023-01-02 ENCOUNTER — Encounter (HOSPITAL_COMMUNITY): Payer: Self-pay | Admitting: Student

## 2023-01-02 ENCOUNTER — Inpatient Hospital Stay (HOSPITAL_COMMUNITY): Payer: Medicare Other | Admitting: Certified Registered"

## 2023-01-02 ENCOUNTER — Inpatient Hospital Stay (HOSPITAL_COMMUNITY): Payer: Medicare Other

## 2023-01-02 DIAGNOSIS — I2510 Atherosclerotic heart disease of native coronary artery without angina pectoris: Secondary | ICD-10-CM

## 2023-01-02 DIAGNOSIS — J449 Chronic obstructive pulmonary disease, unspecified: Secondary | ICD-10-CM | POA: Diagnosis not present

## 2023-01-02 DIAGNOSIS — J9601 Acute respiratory failure with hypoxia: Secondary | ICD-10-CM | POA: Diagnosis not present

## 2023-01-02 DIAGNOSIS — T17900A Unspecified foreign body in respiratory tract, part unspecified causing asphyxiation, initial encounter: Secondary | ICD-10-CM

## 2023-01-02 DIAGNOSIS — I10 Essential (primary) hypertension: Secondary | ICD-10-CM

## 2023-01-02 DIAGNOSIS — F17210 Nicotine dependence, cigarettes, uncomplicated: Secondary | ICD-10-CM

## 2023-01-02 HISTORY — PX: BRONCHIAL WASHINGS: SHX5105

## 2023-01-02 HISTORY — PX: VIDEO BRONCHOSCOPY: SHX5072

## 2023-01-02 LAB — CBC
HCT: 30.6 % — ABNORMAL LOW (ref 36.0–46.0)
Hemoglobin: 10.6 g/dL — ABNORMAL LOW (ref 12.0–15.0)
MCH: 31.1 pg (ref 26.0–34.0)
MCHC: 34.6 g/dL (ref 30.0–36.0)
MCV: 89.7 fL (ref 80.0–100.0)
Platelets: 187 10*3/uL (ref 150–400)
RBC: 3.41 MIL/uL — ABNORMAL LOW (ref 3.87–5.11)
RDW: 14.1 % (ref 11.5–15.5)
WBC: 8.3 10*3/uL (ref 4.0–10.5)
nRBC: 0 % (ref 0.0–0.2)

## 2023-01-02 LAB — BASIC METABOLIC PANEL
Anion gap: 8 (ref 5–15)
BUN: 11 mg/dL (ref 8–23)
CO2: 25 mmol/L (ref 22–32)
Calcium: 8.3 mg/dL — ABNORMAL LOW (ref 8.9–10.3)
Chloride: 102 mmol/L (ref 98–111)
Creatinine, Ser: 0.52 mg/dL (ref 0.44–1.00)
GFR, Estimated: >60 mL/min (ref >60)
Glucose, Bld: 135 mg/dL — ABNORMAL HIGH (ref 70–99)
Potassium: 3.7 mmol/L (ref 3.5–5.1)
Sodium: 135 mmol/L (ref 135–145)

## 2023-01-02 LAB — BODY FLUID CELL COUNT WITH DIFFERENTIAL
Eos, Fluid: 0 %
Lymphs, Fluid: 1 %
Monocyte-Macrophage-Serous Fluid: 1 % — ABNORMAL LOW (ref 50–90)
Neutrophil Count, Fluid: 98 % — ABNORMAL HIGH (ref 0–25)
Total Nucleated Cell Count, Fluid: 4550 cu mm — ABNORMAL HIGH (ref 0–1000)

## 2023-01-02 LAB — GLUCOSE, CAPILLARY
Glucose-Capillary: 125 mg/dL — ABNORMAL HIGH (ref 70–99)
Glucose-Capillary: 131 mg/dL — ABNORMAL HIGH (ref 70–99)
Glucose-Capillary: 137 mg/dL — ABNORMAL HIGH (ref 70–99)
Glucose-Capillary: 134 mg/dL — ABNORMAL HIGH (ref 70–99)
Glucose-Capillary: 210 mg/dL — ABNORMAL HIGH (ref 70–99)
Glucose-Capillary: 133 mg/dL — ABNORMAL HIGH (ref 70–99)
Glucose-Capillary: 90 mg/dL (ref 70–99)

## 2023-01-02 LAB — MRSA NEXT GEN BY PCR, NASAL: MRSA by PCR Next Gen: NOT DETECTED

## 2023-01-02 LAB — CULTURE, RESPIRATORY W GRAM STAIN

## 2023-01-02 SURGERY — VIDEO BRONCHOSCOPY WITHOUT FLUORO
Anesthesia: General

## 2023-01-02 MED ORDER — LIDOCAINE 2% (20 MG/ML) 5 ML SYRINGE
INTRAMUSCULAR | Status: DC | PRN
  Administered 2023-01-02: 60 mg via INTRAVENOUS

## 2023-01-02 MED ORDER — ORAL CARE MOUTH RINSE: 15.0000 mL | OROMUCOSAL | Status: DC | PRN

## 2023-01-02 MED ORDER — LIDOCAINE HCL URETHRAL/MUCOSAL 2 % EX GEL: 1.0000 | Freq: Once | CUTANEOUS | Status: DC

## 2023-01-02 MED ORDER — IPRATROPIUM-ALBUTEROL 0.5-2.5 (3) MG/3ML IN SOLN
RESPIRATORY_TRACT | Status: AC
  Filled 2023-01-02: qty 3

## 2023-01-02 MED ORDER — PROPOFOL 10 MG/ML IV BOLUS
INTRAVENOUS | Status: DC | PRN
  Administered 2023-01-02: 100 mg via INTRAVENOUS

## 2023-01-02 MED ORDER — SUCCINYLCHOLINE CHLORIDE 200 MG/10ML IV SOSY
PREFILLED_SYRINGE | INTRAVENOUS | Status: DC | PRN
  Administered 2023-01-02: 80 mg via INTRAVENOUS

## 2023-01-02 MED ORDER — BUTAMBEN-TETRACAINE-BENZOCAINE 2-2-14 % EX AERO: 1.0000 | INHALATION_SPRAY | Freq: Once | CUTANEOUS | Status: DC

## 2023-01-02 MED ORDER — FENTANYL CITRATE (PF) 100 MCG/2ML IJ SOLN
INTRAMUSCULAR | Status: AC
  Filled 2023-01-02: qty 2

## 2023-01-02 MED ORDER — FENTANYL CITRATE (PF) 100 MCG/2ML IJ SOLN: 25.0000 ug | INTRAMUSCULAR | Status: DC | PRN

## 2023-01-02 MED ORDER — PHENYLEPHRINE HCL 0.25 % NA SOLN: 1.0000 | Freq: Four times a day (QID) | NASAL | Status: DC | PRN

## 2023-01-02 MED ORDER — PHENYLEPHRINE 80 MCG/ML (10ML) SYRINGE FOR IV PUSH (FOR BLOOD PRESSURE SUPPORT)
PREFILLED_SYRINGE | INTRAVENOUS | Status: DC | PRN
  Administered 2023-01-02 (×3): 160 ug via INTRAVENOUS

## 2023-01-02 MED ORDER — LACTATED RINGERS IV SOLN: INTRAVENOUS | Status: DC

## 2023-01-02 MED ORDER — IPRATROPIUM-ALBUTEROL 0.5-2.5 (3) MG/3ML IN SOLN
3.0000 mL | Freq: Once | RESPIRATORY_TRACT | Status: AC
  Administered 2023-01-02: 3 mL via RESPIRATORY_TRACT

## 2023-01-02 MED ORDER — AMISULPRIDE (ANTIEMETIC) 5 MG/2ML IV SOLN: 10.0000 mg | Freq: Once | INTRAVENOUS | Status: DC | PRN

## 2023-01-02 MED ORDER — SODIUM CHLORIDE 0.9 % IV SOLN: INTRAVENOUS | Status: DC | PRN

## 2023-01-02 NOTE — Progress Notes (Signed)
Patient refused to do do cpt.

## 2023-01-02 NOTE — Anesthesia Procedure Notes (Signed)
Procedure Name: Intubation Date/Time: 01/02/2023 7:32 AM  Performed by: Alwyn Ren, CRNAPre-anesthesia Checklist: Patient identified, Emergency Drugs available, Suction available and Patient being monitored Patient Re-evaluated:Patient Re-evaluated prior to induction Oxygen Delivery Method: Circle system utilized Preoxygenation: Pre-oxygenation with 100% oxygen Induction Type: IV induction Ventilation: Mask ventilation without difficulty Grade View: Grade I Tube type: Oral Tube size: 8.5 mm Number of attempts: 1 Airway Equipment and Method: Stylet and Oral airway Placement Confirmation: ETT inserted through vocal cords under direct vision, positive ETCO2 and breath sounds checked- equal and bilateral Secured at: 21 cm Tube secured with: Tape Dental Injury: Teeth and Oropharynx as per pre-operative assessment

## 2023-01-02 NOTE — Progress Notes (Signed)
OT Cancellation Note  Patient Details Name: [name] MRN: [id number] DOB: 1946/07/14   Cancelled Treatment:    Reason Eval/Treat Not Completed: Patient not medically ready (Medical issues which prohibited therapy. Pt received bronchoscopy this AM, now transferred to ICU and on BIPAP. OT will hold and follow up as appropriate/available. Thank you.)  [name] "Orson Eva., OTR/L, MA Acute Rehab [phone]   [name] 01/02/2023, 10:03 AM

## 2023-01-02 NOTE — Progress Notes (Addendum)
PCCM note  Post bronch patient was in resp distress after extubation with increased WOB. Able to hear breath sounds on both sides PCXR reviewed with improved aeration of the left lung, no pneumothorax Will being to ICU on Bipap for 24 hr monitoring Husband informed over telephone  The patient is critically ill with multiple organ system failure and requires high complexity decision making for assessment and support, frequent evaluation and titration of therapies, advanced monitoring, review of radiographic studies and interpretation of complex data.   Critical Care Time devoted to patient care services, exclusive of separately billable procedures, described in this note is 35 minutes.   Chilton Greathouse MD [facility] See Amion for pager  If no response to pager , please call [phone] until 7pm After 7:00 pm call Elink  [phone] 01/02/2023, 9:27 AM

## 2023-01-02 NOTE — Progress Notes (Signed)
Patient was transported on BIPAP to 3M13 without any complications.

## 2023-01-02 NOTE — Op Note (Signed)
[facility] Cardiopulmonary Patient Name: [name] Pocedure Date: 01/02/2023 MRN: [id number] Attending MD: Chilton Greathouse , MD, [id number] Date of Birth: Jul 16, 1946 CSN: Finalized Age: 77 Admit Type: Inpatient Gender: Female Procedure:             Bronchoscopy Indications:           Mucous plug Providers:             Chilton Greathouse, MD, Adolph Pollack, RN, Priscella Mann, Technician Referring MD:           Medicines:             General Anesthesia Complications:         No immediate complications. Estimated Blood Loss:  Estimated blood loss: none. Procedure:             Pre-Anesthesia Assessment:                        - A History and Physical has been performed. Patient                         meds and allergies have been reviewed. The risks and                         benefits of the procedure and the sedation options and                         risks were discussed with the patient. All questions                         were answered and informed consent was obtained.                         Patient identification and proposed procedure were                         verified prior to the procedure by the                         anesthesiologist in the pre-procedure area. Mental                         Status Examination: alert and oriented. Airway                         Examination: normal oropharyngeal airway. Respiratory                         Examination: poor air movement. CV Examination: RRR,                         no murmurs, no S3 or S4. ASA Grade Assessment: II - A                         patient with mild systemic disease. After reviewing  the risks and benefits, the patient was deemed in                         satisfactory condition to undergo the procedure. The                         anesthesia plan was to use general anesthesia.                         Immediately prior to administration of  medications,                         the patient was re-assessed for adequacy to receive                         sedatives. The heart rate, respiratory rate, oxygen                         saturations, blood pressure, adequacy of pulmonary                         ventilation, and response to care were monitored                         throughout the procedure. The physical status of the                         patient was re-assessed after the procedure.                        After obtaining informed consent, the bronchoscope was                         passed under direct vision. Throughout the procedure,                         the patient's blood pressure, pulse, and oxygen                         saturations were monitored continuously. the [id number]                         ([id number]) Olympus bronchoscope was introduced through                         the mouth, via the endotracheal tube and advanced to                         the tracheobronchial tree of both lungs. Scope In: Scope Out: Findings:      Left Lung Abnormalities. The entire left lung was occluded with thick       mucus plugs which were cleared with bronchial washings. The bronchoscope       was advanced until wedged at the desired location for bronchoalveolar       lavage. BAL was performed in the LLL lateral basal segments (B9) of the       lung and sent for cell count, bacterial culture, viral smears & culture,       and  fungal & AFB analysis. 120 mL of fluid were instilled. 60 mL were       returned. The return was mucopurulent. There were no mucoid plugs in the       return fluid. Impression:            - Mucous plug of left lung                        - Bronchoalveolar lavage was performed. Moderate Sedation:      OTHER      NA Recommendation:        - Await BAL results. Procedure Code(s):     --- Professional ---                        [phone], Bronchoscopy, rigid or flexible, including                          fluoroscopic guidance, when performed; with bronchial                         alveolar lavage Diagnosis Code(s):     --- Professional ---                        T17.990A, Other foreign object in respiratory tract,                         part unspecified in causing asphyxiation, initial                         encounter CPT copyright 2022 [employer]. All rights reserved. The codes documented in this report are preliminary and upon coder review may  be revised to meet current compliance requirements. Chilton Greathouse, MD Chilton Greathouse, MD 01/02/2023 8:01:21 AM Number of Addenda: 0

## 2023-01-02 NOTE — Progress Notes (Signed)
Underwent bronchoscopy earlier this morning before I could see this patient. Informed by Dr. Isaiah Serge that patient will be moved to the ICU as she is now requiring BiPAP. Discussed with Dr. Astrid Divine will take over her care-and will transfer her back to [facility] when she is stable.

## 2023-01-02 NOTE — Transfer of Care (Signed)
Immediate Anesthesia Transfer of Care Note  Patient: [name]  Procedure(s) Performed: VIDEO BRONCHOSCOPY WITHOUT FLUORO BRONCHIAL WASHINGS  Patient Location: PACU  Anesthesia Type:General  Level of Consciousness: awake and patient cooperative  Airway & Oxygen Therapy: Patient Spontanous Breathing, Patient connected to face mask oxygen, and After extubation, pt sats dropping into mid 80's with work of breathing increasing. Dr. Renold Don called and pt sent to PACU  Post-op Assessment: Report given to RN and Post -op Vital signs reviewed and stable  Post vital signs: Reviewed and stable  Last Vitals:  Vitals Value Taken Time  BP 120/70 01/02/23 0815  Temp    Pulse 85 01/02/23 0816  Resp 30 01/02/23 0816  SpO2 91 % 01/02/23 0816  Vitals shown include unvalidated device data.  Last Pain:  Vitals:   01/02/23 0701  TempSrc: Temporal  PainSc: 0-No pain         Complications: No notable events documented.

## 2023-01-02 NOTE — Progress Notes (Signed)
   NAME:  [name], MRN:  [id number], DOB:  February 23, 1946, LOS: 4 ADMISSION DATE:  12/29/2022, CONSULTATION DATE:  4/17 REFERRING MD:  Dr. Jerral Ralph, CHIEF COMPLAINT:  ARF w/ hypoxia   History of Present Illness:   77 yo female with chronic pain seems to have an accidental opiate overdose. She had altered mental status and fell. This resulted in fracture of her Rt prosthetic hip. She developed Lt sided chest pain and worsening hypoxia. CT chest showed collapse of LLL. She has weak cough from chest pain.   Pertinent  Medical History  Asthma/COPD, Breast cancer, CAD, Esophageal diverticulum, ETOH, A fib, HLD, HTN, Arthritis  Significant Hospital Events: Including procedures, antibiotic start and stop dates in addition to other pertinent events   4/15 admitted w/ encephalopathy  4/17 worsening hypoxia; LLL collapse; pccm consulted  Interim History / Subjective:   Patient examined in the preprocedure area.  Does not have any complaints and is ready for the procedure  Objective   Blood pressure (!) 144/77, pulse 81, temperature (!) 97.4 F (36.3 C), temperature source Temporal, resp. rate (!) 24, SpO2 97 %.    FiO2 (%):  [92 %] 92 %   Intake/Output Summary (Last 24 hours) at 01/02/2023 0719 Last data filed at 01/02/2023 0620 Gross per 24 hour  Intake 510.78 ml  Output 2500 ml  Net -1989.22 ml   There were no vitals filed for this visit.  Examination: Gen:      No acute distress, frail, elderly HEENT:  EOMI, sclera anicteric Neck:     No masses; no thyromegaly Lungs:    Diminished breath sounds on the left CV:         Regular rate and rhythm; no murmurs Abd:      + bowel sounds; soft, non-tender; no palpable masses, no distension Ext:    No edema; adequate peripheral perfusion Skin:      Warm and dry; no rash Neuro: Sleepy but wakes up appropriately and answers questions  Chest x-ray today reviewed with persistent opacification of left hemithorax.  Assessment & Plan:   Acute  hypoxic respiratory failure from aspiration pneumonitis with Lt lower lung collapse. Hx of COPD/asthma. X-ray looks unchanged She is scheduled for bronchoscopy today for inspection and clearance of mucous plugging She is agreeable for the procedure and has signed consent Chest PT, flutter valve, nebulizer therapy Continue antibiotics.  Rt hip periprosthetic fracture. Chronic pain. Hx of CVA. HLD. HTN. DM type 2. Hx of C diff. - per primary team  Signature:   Chilton Greathouse MD [place] See Amion for pager  If no response to pager , please call [phone] until 7pm After 7:00 pm call Elink  [phone] 01/02/2023, 7:20 AM

## 2023-01-02 NOTE — Progress Notes (Signed)
Patient is tired and does not want to do cpt at this time. Refused, will try again next rounds.

## 2023-01-02 NOTE — Progress Notes (Signed)
RT note: Pt refusing CPT and Neb tx's at this time. Pt on 6L [place] tolerating well Bipap on standby.

## 2023-01-02 NOTE — Progress Notes (Signed)
PT Cancellation Note  Patient Details Name: [name] MRN: [id number] DOB: 03-14-1946   Cancelled Treatment:    Reason Eval/Treat Not Completed: Medical issues which prohibited therapy. Pt received bronchoscopy this AM, now transferred to ICU and on BIPAP. PT will hold and follow up as appropriate/available. Thank you.    Leonie Man 01/02/2023, 9:28 AM

## 2023-01-03 DIAGNOSIS — L8990 Pressure ulcer of unspecified site, unspecified stage: Secondary | ICD-10-CM | POA: Insufficient documentation

## 2023-01-03 DIAGNOSIS — T17998A Other foreign object in respiratory tract, part unspecified causing other injury, initial encounter: Secondary | ICD-10-CM

## 2023-01-03 DIAGNOSIS — J9601 Acute respiratory failure with hypoxia: Secondary | ICD-10-CM | POA: Diagnosis not present

## 2023-01-03 LAB — CBC WITH DIFFERENTIAL/PLATELET
Abs Immature Granulocytes: 0.02 10*3/uL (ref 0.00–0.07)
Basophils Absolute: 0 10*3/uL (ref 0.0–0.1)
Basophils Relative: 0 %
Eosinophils Absolute: 0.1 10*3/uL (ref 0.0–0.5)
Eosinophils Relative: 1 %
HCT: 28 % — ABNORMAL LOW (ref 36.0–46.0)
Hemoglobin: 9.4 g/dL — ABNORMAL LOW (ref 12.0–15.0)
Immature Granulocytes: 0 %
Lymphocytes Relative: 20 %
Lymphs Abs: 1.2 10*3/uL (ref 0.7–4.0)
MCH: 30.8 pg (ref 26.0–34.0)
MCHC: 33.6 g/dL (ref 30.0–36.0)
MCV: 91.8 fL (ref 80.0–100.0)
Monocytes Absolute: 0.4 10*3/uL (ref 0.1–1.0)
Monocytes Relative: 7 %
Neutro Abs: 4.5 10*3/uL (ref 1.7–7.7)
Neutrophils Relative %: 72 %
Platelets: 188 10*3/uL (ref 150–400)
RBC: 3.05 MIL/uL — ABNORMAL LOW (ref 3.87–5.11)
RDW: 14.1 % (ref 11.5–15.5)
WBC: 6.2 10*3/uL (ref 4.0–10.5)
nRBC: 0 % (ref 0.0–0.2)

## 2023-01-03 LAB — GLUCOSE, CAPILLARY
Glucose-Capillary: 106 mg/dL — ABNORMAL HIGH (ref 70–99)
Glucose-Capillary: 107 mg/dL — ABNORMAL HIGH (ref 70–99)
Glucose-Capillary: 178 mg/dL — ABNORMAL HIGH (ref 70–99)
Glucose-Capillary: 120 mg/dL — ABNORMAL HIGH (ref 70–99)
Glucose-Capillary: 162 mg/dL — ABNORMAL HIGH (ref 70–99)

## 2023-01-03 LAB — BASIC METABOLIC PANEL
Anion gap: 8 (ref 5–15)
BUN: 13 mg/dL (ref 8–23)
CO2: 27 mmol/L (ref 22–32)
Calcium: 8 mg/dL — ABNORMAL LOW (ref 8.9–10.3)
Chloride: 100 mmol/L (ref 98–111)
Creatinine, Ser: 0.55 mg/dL (ref 0.44–1.00)
GFR, Estimated: >60 mL/min (ref >60)
Glucose, Bld: 98 mg/dL (ref 70–99)
Potassium: 3.6 mmol/L (ref 3.5–5.1)
Sodium: 135 mmol/L (ref 135–145)

## 2023-01-03 LAB — CULTURE, RESPIRATORY W GRAM STAIN

## 2023-01-03 LAB — BRAIN NATRIURETIC PEPTIDE: B Natriuretic Peptide: 149 pg/mL — ABNORMAL HIGH (ref 0.0–100.0)

## 2023-01-03 LAB — MAGNESIUM: Magnesium: 1.7 mg/dL (ref 1.7–2.4)

## 2023-01-03 MED ORDER — POTASSIUM CHLORIDE CRYS ER 20 MEQ PO TBCR
40.0000 meq | EXTENDED_RELEASE_TABLET | Freq: Once | ORAL | Status: AC
  Administered 2023-01-03: 40 meq via ORAL
  Filled 2023-01-03: qty 2

## 2023-01-03 MED ORDER — IPRATROPIUM-ALBUTEROL 0.5-2.5 (3) MG/3ML IN SOLN
3.0000 mL | Freq: Two times a day (BID) | RESPIRATORY_TRACT | Status: DC
  Administered 2023-01-04 – 2023-01-06 (×5): 3 mL via RESPIRATORY_TRACT
  Filled 2023-01-03 (×5): qty 3

## 2023-01-03 MED ORDER — IPRATROPIUM-ALBUTEROL 0.5-2.5 (3) MG/3ML IN SOLN: 3.0000 mL | Freq: Two times a day (BID) | RESPIRATORY_TRACT | Status: DC

## 2023-01-03 MED ORDER — ALBUTEROL SULFATE (2.5 MG/3ML) 0.083% IN NEBU: 2.5000 mg | INHALATION_SOLUTION | RESPIRATORY_TRACT | Status: DC | PRN

## 2023-01-03 NOTE — Progress Notes (Addendum)
CPT not done. During breathing treatment patient asked to stop and get on bedside commode.  UPDATE:  Treatment & CPT complete.

## 2023-01-03 NOTE — TOC Progression Note (Addendum)
Transition of Care [facility]) - Progression Note    Patient Details  Name: KEYRI SALBERG MRN: [id number] Date of Birth: [birth year]  Transition of Care [facility]) CM/SW Contact  Heiress Williamson [place], [place] Phone Number: 01/03/2023, 2:40 PM  Clinical Narrative:    Met with patient at bedside to discuss bed offers. Patient requested that her  daughter be contacted.  Phone call to patient's daughter, bed offers provided. She will review and follow up with this Child psychotherapist with bed choice.  Arieanna Pressey, LCSW Transition of Care     Expected Discharge Plan: Skilled Nursing Facility Barriers to Discharge: Continued Medical Work up, English as a second language teacher, SNF Pending bed offer  Expected Discharge Plan and Services In-house Referral: Clinical Social Work   Post Acute Care Choice: Skilled Nursing Facility Living arrangements for the past 2 months: Single Family Home                                       Social Determinants of Health (SDOH) Interventions SDOH Screenings   Food Insecurity: No Food Insecurity (12/29/2022)  Housing: Low Risk  (12/29/2022)  Transportation Needs: No Transportation Needs (12/29/2022)  Utilities: Not At Risk (12/29/2022)  Tobacco Use: High Risk (01/02/2023)    Readmission Risk Interventions    12/30/2022    3:18 PM 12/08/2022   12:02 PM 11/02/2022   12:01 PM  Readmission Risk Prevention Plan  Transportation Screening Complete Complete Complete  PCP or Specialist Appt within 5-7 Days   Complete  Home Care Screening   Complete  Medication Review (RN CM)   Complete  HRI or Home Care Consult  Complete   Social Work Consult for Recovery Care Planning/Counseling  Complete   Palliative Care Screening  Not Applicable   Medication Review Oceanographer) Complete Complete   PCP or Specialist appointment within 3-5 days of discharge Complete    HRI or Home Care Consult Complete    SW Recovery Care/Counseling Consult Complete    Palliative Care  Screening Not Applicable    Skilled Nursing Facility Complete

## 2023-01-03 NOTE — Progress Notes (Signed)
Patient declined bath at beginning of shift and on 0400 assessment.

## 2023-01-03 NOTE — Progress Notes (Signed)
BiPAP therapy on standby, pt is currently tolerating room air at this time.
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
## 2023-01-03 NOTE — Progress Notes (Signed)
Pt's daughter Herbert Seta, notified about pt's transfer to 5N.

## 2023-01-04 DIAGNOSIS — Z96649 Presence of unspecified artificial hip joint: Secondary | ICD-10-CM | POA: Diagnosis not present

## 2023-01-04 DIAGNOSIS — T84019A Broken internal joint prosthesis, unspecified site, initial encounter: Secondary | ICD-10-CM | POA: Diagnosis not present

## 2023-01-04 LAB — CBC WITH DIFFERENTIAL/PLATELET
Abs Immature Granulocytes: 0.01 10*3/uL (ref 0.00–0.07)
Basophils Absolute: 0 10*3/uL (ref 0.0–0.1)
Basophils Relative: 1 %
Eosinophils Absolute: 0.1 10*3/uL (ref 0.0–0.5)
Eosinophils Relative: 2 %
HCT: 29.4 % — ABNORMAL LOW (ref 36.0–46.0)
Hemoglobin: 9.8 g/dL — ABNORMAL LOW (ref 12.0–15.0)
Immature Granulocytes: 0 %
Lymphocytes Relative: 17 %
Lymphs Abs: 1.1 10*3/uL (ref 0.7–4.0)
MCH: 30.4 pg (ref 26.0–34.0)
MCHC: 33.3 g/dL (ref 30.0–36.0)
MCV: 91.3 fL (ref 80.0–100.0)
Monocytes Absolute: 0.4 10*3/uL (ref 0.1–1.0)
Monocytes Relative: 7 %
Neutro Abs: 4.6 10*3/uL (ref 1.7–7.7)
Neutrophils Relative %: 73 %
Platelets: 210 10*3/uL (ref 150–400)
RBC: 3.22 MIL/uL — ABNORMAL LOW (ref 3.87–5.11)
RDW: 14 % (ref 11.5–15.5)
WBC: 6.3 10*3/uL (ref 4.0–10.5)
nRBC: 0 % (ref 0.0–0.2)

## 2023-01-04 LAB — GLUCOSE, CAPILLARY
Glucose-Capillary: 103 mg/dL — ABNORMAL HIGH (ref 70–99)
Glucose-Capillary: 103 mg/dL — ABNORMAL HIGH (ref 70–99)
Glucose-Capillary: 119 mg/dL — ABNORMAL HIGH (ref 70–99)
Glucose-Capillary: 133 mg/dL — ABNORMAL HIGH (ref 70–99)
Glucose-Capillary: 140 mg/dL — ABNORMAL HIGH (ref 70–99)
Glucose-Capillary: 147 mg/dL — ABNORMAL HIGH (ref 70–99)

## 2023-01-04 LAB — BASIC METABOLIC PANEL
Anion gap: 6 (ref 5–15)
BUN: 13 mg/dL (ref 8–23)
CO2: 23 mmol/L (ref 22–32)
Calcium: 8 mg/dL — ABNORMAL LOW (ref 8.9–10.3)
Chloride: 103 mmol/L (ref 98–111)
Creatinine, Ser: 0.45 mg/dL (ref 0.44–1.00)
GFR, Estimated: >60 mL/min (ref >60)
Glucose, Bld: 126 mg/dL — ABNORMAL HIGH (ref 70–99)
Potassium: 3.6 mmol/L (ref 3.5–5.1)
Sodium: 132 mmol/L — ABNORMAL LOW (ref 135–145)

## 2023-01-04 LAB — MAGNESIUM: Magnesium: 2 mg/dL (ref 1.7–2.4)

## 2023-01-04 LAB — CULTURE, RESPIRATORY W GRAM STAIN

## 2023-01-04 LAB — BRAIN NATRIURETIC PEPTIDE: B Natriuretic Peptide: 173.2 pg/mL — ABNORMAL HIGH (ref 0.0–100.0)

## 2023-01-04 MED ORDER — POTASSIUM CHLORIDE 20 MEQ PO PACK
20.0000 meq | PACK | Freq: Once | ORAL | Status: AC
  Administered 2023-01-04: 20 meq via ORAL
  Filled 2023-01-04: qty 1

## 2023-01-04 MED ORDER — LIDOCAINE 5 % EX PTCH
1.0000 | MEDICATED_PATCH | CUTANEOUS | Status: DC
  Administered 2023-01-04 – 2023-01-06 (×3): 1 via TRANSDERMAL
  Filled 2023-01-04 (×3): qty 1

## 2023-01-04 MED ORDER — POTASSIUM CHLORIDE 10 MEQ/100ML IV SOLN
10.0000 meq | INTRAVENOUS | Status: AC
  Administered 2023-01-04 (×2): 10 meq via INTRAVENOUS
  Filled 2023-01-04 (×2): qty 100

## 2023-01-04 MED ORDER — SODIUM CHLORIDE 0.9 % IV SOLN
3.0000 g | Freq: Three times a day (TID) | INTRAVENOUS | Status: AC
  Administered 2023-01-04 – 2023-01-06 (×6): 3 g via INTRAVENOUS
  Filled 2023-01-04 (×6): qty 8

## 2023-01-04 NOTE — Plan of Care (Signed)
  Problem: Education: Goal: Ability to describe self-care measures that may prevent or decrease complications (Diabetes Survival Skills Education) will improve Outcome: Progressing   Problem: Nutritional: Goal: Maintenance of adequate nutrition will improve Outcome: Progressing   Problem: Education: Goal: Knowledge of General Education information will improve Description: Including pain rating scale, medication(s)/side effects and non-pharmacologic comfort measures Outcome: Progressing
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
## 2023-01-04 NOTE — Progress Notes (Signed)
While giving AM treatment, patient was complaining of severe back pain and stating that she takes meds at home every 4 hours.  Will not be able to perform CPT at this time.  Will continue to monitor.

## 2023-01-04 NOTE — Plan of Care (Signed)
  Problem: Education: Goal: Ability to describe self-care measures that may prevent or decrease complications (Diabetes Survival Skills Education) will improve Outcome: Progressing   Problem: Coping: Goal: Ability to adjust to condition or change in health will improve Outcome: Progressing   Problem: Fluid Volume: Goal: Ability to maintain a balanced intake and output will improve Outcome: Progressing   Problem: Health Behavior/Discharge Planning: Goal: Ability to identify and utilize available resources and services will improve Outcome: Progressing Goal: Ability to manage health-related needs will improve Outcome: Progressing   Problem: Metabolic: Goal: Ability to maintain appropriate glucose levels will improve Outcome: Progressing   Problem: Nutritional: Goal: Maintenance of adequate nutrition will improve Outcome: Progressing Goal: Progress toward achieving an optimal weight will improve Outcome: Progressing   Problem: Skin Integrity: Goal: Risk for impaired skin integrity will decrease Outcome: Progressing   Problem: Tissue Perfusion: Goal: Adequacy of tissue perfusion will improve Outcome: Progressing   Problem: Education: Goal: Knowledge of General Education information will improve Description: Including pain rating scale, medication(s)/side effects and non-pharmacologic comfort measures Outcome: Progressing

## 2023-01-04 NOTE — Progress Notes (Signed)
Pharmacy Antibiotic Note  AVEREE HARB is a 77 y.o. female for which pharmacy has been consulted for zosyn dosing for UTI. Patient with a history of cephalosporin intolerance. Chart review reveals tolerating zosyn numerous time.   Unasyn was started to cover for asp PNA. Patient has received 5 consistent days of antibiotics. Pt is now satting in the 93-97% range on RA. Afebrile, BP okay.   Note e coli resistant to ampicillin/unasyn in urine culture and physician okay with monitoring at this time. No note of symptoms.   Plan: Continue Unasyn 3g IV q8 for 7 total days (last dose 4/23, stop date entered) F/u clinical status  F/u discontinuing vancomycin PO when Unasyn discontinued (can continue for up to 7 days after antibiotics)  Height:  (157.5 cm) Weight: 54.4 kg (119 lb 14.9 oz) IBW/kg (Calculated) : 50.1  Temp (24hrs), Avg:98.2 F (36.8 C), Min:97.4 F (36.3 C), Max:98.5 F (36.9 C)  Recent Labs  Lab 12/31/22 0343 01/01/23 0731 01/02/23 0330 01/03/23 0114 01/04/23 0223  WBC 7.0 8.4 8.3 6.2 6.3  CREATININE 0.65 0.62 0.52 0.55 0.45     Estimated Creatinine Clearance: 48.8 mL/min (by C-G formula based on SCr of 0.45 mg/dL).    Allergies  Allergen Reactions   Cefuroxime Other (See Comments)    Oral ulcers   Cephalexin Other (See Comments)    Took off first layer of skin inside of mouth   Chlorhexidine Other (See Comments)    Mouth broke out- oral ulcers   Prednisone Other (See Comments)    Possible oral ulcers   Zithromax [Azithromycin Dihydrate] Other (See Comments)    ORAL ULCERS    Microbiology results: 4/15 zosyn>>4/16 4/17 Unasyn>> (4/23)   4/15 urine >> e.coli pan sens except amp/unasyn 4/19 MRSA PCR: not detected  4/19 resp culture > WBC present, no bacteria noted   Fara Olden, PharmD, BCPS, BCOP Clinical Pharmacist 01/04/2023 3:56 PM

## 2023-01-04 NOTE — TOC Progression Note (Addendum)
Transition of Care [facility]) - Progression Note    Patient Details  Name: [name] MRN: [id number] Date of Birth: [birth year]  Transition of Care [facility]) CM/SW Contact  Malicia Blasdel, [place], [place] Phone Number: 01/04/2023, 1:06 PM  Clinical Narrative:    Phone call to patient's daughter Denton Lank, to follow up on bed offer choice. VM left for a return call.  3:12PM return call from patient's daughter, chose [facility] for bed choice  Reighlynn Swiney, LCSW Transition of Care    Expected Discharge Plan: Skilled Nursing Facility Barriers to Discharge: Continued Medical Work up, English as a second language teacher, SNF Pending bed offer  Expected Discharge Plan and Services In-house Referral: Clinical Social Work   Post Acute Care Choice: Skilled Nursing Facility Living arrangements for the past 2 months: Single Family Home                                       Social Determinants of Health (SDOH) Interventions SDOH Screenings   Food Insecurity: No Food Insecurity (12/29/2022)  Housing: Low Risk  (12/29/2022)  Transportation Needs: No Transportation Needs (12/29/2022)  Utilities: Not At Risk (12/29/2022)  Tobacco Use: High Risk (01/02/2023)    Readmission Risk Interventions    12/30/2022    3:18 PM 12/08/2022   12:02 PM 11/02/2022   12:01 PM  Readmission Risk Prevention Plan  Transportation Screening Complete Complete Complete  PCP or Specialist Appt within 5-7 Days   Complete  Home Care Screening   Complete  Medication Review (RN CM)   Complete  HRI or Home Care Consult  Complete   Social Work Consult for Recovery Care Planning/Counseling  Complete   Palliative Care Screening  Not Applicable   Medication Review Oceanographer) Complete Complete   PCP or Specialist appointment within 3-5 days of discharge Complete    HRI or Home Care Consult Complete    SW Recovery Care/Counseling Consult Complete    Palliative Care Screening Not Applicable    Skilled Nursing  Facility Complete

## 2023-01-05 DIAGNOSIS — T84019A Broken internal joint prosthesis, unspecified site, initial encounter: Secondary | ICD-10-CM | POA: Diagnosis not present

## 2023-01-05 DIAGNOSIS — Z96649 Presence of unspecified artificial hip joint: Secondary | ICD-10-CM | POA: Diagnosis not present

## 2023-01-05 LAB — BASIC METABOLIC PANEL
Anion gap: 8 (ref 5–15)
BUN: 10 mg/dL (ref 8–23)
CO2: 23 mmol/L (ref 22–32)
Calcium: 8.2 mg/dL — ABNORMAL LOW (ref 8.9–10.3)
Chloride: 104 mmol/L (ref 98–111)
Creatinine, Ser: 0.42 mg/dL — ABNORMAL LOW (ref 0.44–1.00)
GFR, Estimated: >60 mL/min (ref >60)
Glucose, Bld: 113 mg/dL — ABNORMAL HIGH (ref 70–99)
Potassium: 3.4 mmol/L — ABNORMAL LOW (ref 3.5–5.1)
Sodium: 135 mmol/L (ref 135–145)

## 2023-01-05 LAB — CBC WITH DIFFERENTIAL/PLATELET
Abs Immature Granulocytes: 0.02 10*3/uL (ref 0.00–0.07)
Basophils Absolute: 0 10*3/uL (ref 0.0–0.1)
Basophils Relative: 1 %
Eosinophils Absolute: 0.1 10*3/uL (ref 0.0–0.5)
Eosinophils Relative: 2 %
HCT: 31.9 % — ABNORMAL LOW (ref 36.0–46.0)
Hemoglobin: 10.6 g/dL — ABNORMAL LOW (ref 12.0–15.0)
Immature Granulocytes: 0 %
Lymphocytes Relative: 25 %
Lymphs Abs: 1.3 10*3/uL (ref 0.7–4.0)
MCH: 30.6 pg (ref 26.0–34.0)
MCHC: 33.2 g/dL (ref 30.0–36.0)
MCV: 92.2 fL (ref 80.0–100.0)
Monocytes Absolute: 0.4 10*3/uL (ref 0.1–1.0)
Monocytes Relative: 8 %
Neutro Abs: 3.2 10*3/uL (ref 1.7–7.7)
Neutrophils Relative %: 64 %
Platelets: 247 10*3/uL (ref 150–400)
RBC: 3.46 MIL/uL — ABNORMAL LOW (ref 3.87–5.11)
RDW: 14.2 % (ref 11.5–15.5)
WBC: 5 10*3/uL (ref 4.0–10.5)
nRBC: 0 % (ref 0.0–0.2)

## 2023-01-05 LAB — BRAIN NATRIURETIC PEPTIDE: B Natriuretic Peptide: 147.8 pg/mL — ABNORMAL HIGH (ref 0.0–100.0)

## 2023-01-05 LAB — GLUCOSE, CAPILLARY
Glucose-Capillary: 101 mg/dL — ABNORMAL HIGH (ref 70–99)
Glucose-Capillary: 105 mg/dL — ABNORMAL HIGH (ref 70–99)
Glucose-Capillary: 115 mg/dL — ABNORMAL HIGH (ref 70–99)

## 2023-01-05 LAB — MAGNESIUM: Magnesium: 1.9 mg/dL (ref 1.7–2.4)

## 2023-01-05 MED ORDER — OXYCODONE HCL 5 MG PO TABS
5.0000 mg | ORAL_TABLET | Freq: Once | ORAL | Status: AC
  Administered 2023-01-05: 5 mg via ORAL
  Filled 2023-01-05: qty 1

## 2023-01-05 MED ORDER — OXYCODONE HCL 5 MG PO TABS: 5.0000 mg | ORAL_TABLET | Freq: Four times a day (QID) | ORAL | 0 refills | Status: DC | PRN

## 2023-01-05 MED ORDER — IPRATROPIUM-ALBUTEROL 0.5-2.5 (3) MG/3ML IN SOLN: 3.0000 mL | Freq: Two times a day (BID) | RESPIRATORY_TRACT | 0 refills | Status: DC

## 2023-01-05 MED ORDER — LIDOCAINE 5 % EX PTCH: 1.0000 | MEDICATED_PATCH | CUTANEOUS | 0 refills | Status: DC

## 2023-01-05 MED ORDER — SODIUM CHLORIDE 3 % IN NEBU
4.0000 mL | INHALATION_SOLUTION | Freq: Two times a day (BID) | RESPIRATORY_TRACT | Status: DC
  Administered 2023-01-06: 4 mL via RESPIRATORY_TRACT
  Filled 2023-01-05 (×2): qty 4

## 2023-01-05 MED ORDER — VANCOMYCIN HCL 125 MG PO CAPS: 125.0000 mg | ORAL_CAPSULE | Freq: Every day | ORAL | 0 refills | Status: AC

## 2023-01-05 MED ORDER — AMOXICILLIN-POT CLAVULANATE 875-125 MG PO TABS: 1.0000 | ORAL_TABLET | Freq: Two times a day (BID) | ORAL | 0 refills | Status: DC

## 2023-01-05 MED ORDER — GUAIFENESIN ER 600 MG PO TB12: 600.0000 mg | ORAL_TABLET | Freq: Two times a day (BID) | ORAL | 0 refills | Status: DC

## 2023-01-05 MED ORDER — ENOXAPARIN SODIUM 30 MG/0.3ML IJ SOSY: 30.0000 mg | PREFILLED_SYRINGE | INTRAMUSCULAR | 0 refills | Status: DC

## 2023-01-05 MED ORDER — POTASSIUM CHLORIDE CRYS ER 20 MEQ PO TBCR
40.0000 meq | EXTENDED_RELEASE_TABLET | Freq: Once | ORAL | Status: AC
  Administered 2023-01-05: 40 meq via ORAL
  Filled 2023-01-05: qty 2

## 2023-01-05 MED ORDER — SODIUM CHLORIDE 3 % IN NEBU: 4.0000 mL | INHALATION_SOLUTION | Freq: Two times a day (BID) | RESPIRATORY_TRACT | 12 refills | Status: DC

## 2023-01-05 NOTE — Anesthesia Postprocedure Evaluation (Signed)
Anesthesia Post Note  Patient: [name]  Procedure(s) Performed: VIDEO BRONCHOSCOPY WITHOUT FLUORO BRONCHIAL WASHINGS     Patient location during evaluation: PACU Anesthesia Type: General Level of consciousness: sedated and patient cooperative Pain management: pain level controlled Vital Signs Assessment: post-procedure vital signs reviewed and stable Respiratory status: spontaneous breathing Cardiovascular status: stable Anesthetic complications: no Comments: Marginal SpO2 and increased WOB. Discussed with Dr. Isaiah Serge. Plan for bipap in PACU. Bipap started and pts WOB greatly improved.   No notable events documented.  Last Vitals:  Vitals:   01/05/23 0800 01/05/23 0900  BP:    Pulse:  74  Resp:  16  Temp:    SpO2: 95% 94%    Last Pain:  Vitals:   01/05/23 0745  TempSrc: Oral  PainSc:                  Lewie Loron

## 2023-01-05 NOTE — TOC Progression Note (Addendum)
Transition of Care [facility]) - Progression Note    Patient Details  Name: [name] MRN: [id number] Date of Birth: November 08, 1945  Transition of [facility]) CM/SW Contact  Lorri Frederick, LCSW Phone Number: 01/05/2023, 9:50 AM  Clinical Narrative:   CSW confirmed with [facility] that they can accept pt today.  Pt needs new PT note for insurance auth, PT notified.   1510-PT note not in  Expected Discharge Plan: Skilled Nursing Facility Barriers to Discharge: Continued Medical Work up, English as a second language teacher, SNF Pending bed offer  Expected Discharge Plan and Services In-house Referral: Clinical Social Work   Post Acute Care Choice: Skilled Nursing Facility Living arrangements for the past 2 months: Single Family Home                                       Social Determinants of Health (SDOH) Interventions SDOH Screenings   Food Insecurity: No Food Insecurity (12/29/2022)  Housing: Low Risk  (12/29/2022)  Transportation Needs: No Transportation Needs (12/29/2022)  Utilities: Not At Risk (12/29/2022)  Tobacco Use: High Risk (01/02/2023)    Readmission Risk Interventions    12/30/2022    3:18 PM 12/08/2022   12:02 PM 11/02/2022   12:01 PM  Readmission Risk Prevention Plan  Transportation Screening Complete Complete Complete  PCP or Specialist Appt within 5-7 Days   Complete  Home Care Screening   Complete  Medication Review (RN CM)   Complete  HRI or Home Care Consult  Complete   Social Work Consult for Recovery Care Planning/Counseling  Complete   Palliative Care Screening  Not Applicable   Medication Review Oceanographer) Complete Complete   PCP or Specialist appointment within 3-5 days of discharge Complete    HRI or Home Care Consult Complete    SW Recovery Care/Counseling Consult Complete    Palliative Care Screening Not Applicable    Skilled Nursing Facility Complete

## 2023-01-05 NOTE — Progress Notes (Signed)
Patient prescriptions printed and placed in white discharge envelope in drawer next to patient's room for discharge.
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
## 2023-01-05 NOTE — Discharge Summary (Signed)
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  Incontinence EXAM: PORTABLE ABDOMEN - 2 VIEW COMPARISON:  11/15/2022 FINDINGS: Scattered large and small bowel gas is noted. No obstructive changes are seen. Degenerative changes of lumbar spine are noted. Bilateral hip replacements are seen. IMPRESSION: No acute abnormality noted. Electronically Signed   By: Alcide Clever M.D.   On: 12/07/2022 01:03   CT Head Wo Contrast  Result Date: 12/06/2022 CLINICAL DATA:  Mental status change, unknown cause EXAM: CT HEAD WITHOUT CONTRAST TECHNIQUE: Contiguous axial images were obtained from the base of the skull through the vertex without intravenous contrast. RADIATION DOSE REDUCTION: This exam was performed according to the departmental dose-optimization program which includes automated exposure control, adjustment of the mA and/or kV according to patient size and/or use of iterative reconstruction technique. COMPARISON:  MRI head 11/16/2022  FINDINGS: Brain: Cerebral ventricle sizes are concordant with the degree of cerebral volume loss. Patchy and confluent areas of decreased attenuation are noted throughout the deep and periventricular white matter of the cerebral hemispheres bilaterally, compatible with chronic microvascular ischemic disease. Bilateral chronic cerebellar infarctions. No evidence of large-territorial acute infarction. No parenchymal hemorrhage. No mass lesion. No extra-axial collection. No mass effect or midline shift. No hydrocephalus. Basilar cisterns are patent. Vascular: No hyperdense vessel. Skull: No acute fracture or focal lesion. Sinuses/Orbits: Paranasal sinuses and mastoid air cells are clear. Bilateral lens replacement. The orbits are unremarkable. Other: None. IMPRESSION: No acute intracranial abnormality. Electronically Signed   By: Tish Frederickson M.D.   On: 12/06/2022 19:42   DG Chest Port 1 View  Result Date: 12/06/2022 CLINICAL DATA:  Shortness of breath, anorexia, and altered mental status for 4 days EXAM: PORTABLE CHEST 1 VIEW COMPARISON:  Portable exam 1839 hours compared to 11/15/2022 FINDINGS: Normal heart size with mitral annular calcification. Mediastinal contours and pulmonary vascularity normal. Atherosclerotic calcification aorta. Postsurgical changes LEFT lung  with mild volume loss LEFT chest. No pulmonary infiltrate, pleural effusion, or pneumothorax. Bones demineralized. Postsurgical changes of RIGHT mastectomy and axillary node dissection. IMPRESSION: No acute abnormalities. Aortic Atherosclerosis (ICD10-I70.0). Electronically Signed   By: Ulyses Southward M.D.   On: 12/06/2022 19:42    Microbiology: Results for orders placed or performed during the hospital encounter of 12/29/22  Urine Culture     Status: Abnormal   Collection Time: 12/29/22  1:40 PM   Specimen: Urine, Random  Result Value Ref Range Status   Specimen Description URINE, RANDOM  Final   Special Requests   Final    NONE Performed  at [facility], [address] [place], [place] [address]    Culture >=100,000 COLONIES/mL ESCHERICHIA COLI (A)  Final   Report Status 12/31/2022 FINAL  Final   Organism ID, Bacteria ESCHERICHIA COLI (A)  Final      Susceptibility   Escherichia coli - MIC*    AMPICILLIN >=32 RESISTANT Resistant     CEFAZOLIN <=4 SENSITIVE Sensitive     CEFEPIME <=0.12 SENSITIVE Sensitive     CEFTRIAXONE <=0.25 SENSITIVE Sensitive     CIPROFLOXACIN <=0.25 SENSITIVE Sensitive     GENTAMICIN <=1 SENSITIVE Sensitive     IMIPENEM <=0.25 SENSITIVE Sensitive     NITROFURANTOIN <=16 SENSITIVE Sensitive     TRIMETH/SULFA <=20 SENSITIVE Sensitive     AMPICILLIN/SULBACTAM 16 INTERMEDIATE Intermediate     PIP/TAZO <=4 SENSITIVE Sensitive     * >=100,000 COLONIES/mL ESCHERICHIA COLI  SARS Coronavirus 2 by RT PCR (hospital order, performed in [facility] hospital lab) *cepheid single result test* Anterior Nasal Swab     Status: None   Collection Time: 01/01/23  9:21 AM   Specimen: Anterior Nasal Swab  Result Value Ref Range Status   SARS Coronavirus 2 by RT PCR NEGATIVE NEGATIVE Final    Comment: Performed at [facility], [address], [place], [place] [address]  Culture, Respiratory w Gram Stain     Status: None   Collection Time: 01/02/23  8:02 AM   Specimen: Bronchoalveolar Lavage; Respiratory  Result Value Ref Range Status   Specimen Description BRONCHIAL ALVEOLAR LAVAGE  Final   Special Requests NONE  Final   Gram Stain   Final    ABUNDANT WBC PRESENT, PREDOMINANTLY PMN FEW YEAST Performed at [facility], [address] [place], [place] [address]    Culture FEW CANDIDA TROPICALIS  Final   Report Status 01/04/2023 FINAL  Final  MRSA Next Gen by PCR, Nasal     Status: None   Collection Time: 01/02/23  9:15 AM   Specimen: Nasal Mucosa; Nasal Swab  Result Value Ref Range Status   MRSA by PCR Next Gen NOT DETECTED NOT DETECTED Final    Comment: (NOTE) The GeneXpert MRSA Assay  (FDA approved for NASAL specimens only), is one component of a comprehensive MRSA colonization surveillance program. It is not intended to diagnose MRSA infection nor to guide or monitor treatment for MRSA infections. Test performance is not FDA approved in patients less than 59 years old. Performed at [facility], [address], [place], [place] [address]     Labs: CBC: Recent Labs  Lab 01/01/23 0731 01/02/23 0330 01/03/23 0114 01/04/23 0223 01/05/23 0255  WBC 8.4 8.3 6.2 6.3 5.0  NEUTROABS  --   --  4.5 4.6 3.2  HGB 11.2* 10.6* 9.4* 9.8* 10.6*  HCT 33.6* 30.6* 28.0* 29.4* 31.9*  MCV 91.6  89.7 91.8 91.3 92.2  PLT 208 187 188 210 247   Basic Metabolic Panel: Recent Labs  Lab 12/30/22 0704 12/31/22 0343 01/01/23 0731 01/02/23 0330 01/03/23 0114 01/04/23 0223 01/05/23 0255  NA 134* 136 135 135 135 132* 135  K 3.1* 3.7 3.2* 3.7 3.6 3.6 3.4*  CL 100 103 98 102 100 103 104  CO2 26 27 25 25 27 23 23   GLUCOSE 110* 91 185* 135* 98 126* 113*  BUN 13 11 10 11 13 13 10   CREATININE 0.75 0.65 0.62 0.52 0.55 0.45 0.42*  CALCIUM 8.2* 8.3* 8.3* 8.3* 8.0* 8.0* 8.2*  MG 1.7 2.1  --   --  1.7 2.0 1.9   Liver Function Tests: Recent Labs  Lab 12/30/22 0704 12/31/22 0343  AST 28 21  ALT 21 17  ALKPHOS 56 55  BILITOT 1.0 0.5  PROT 5.6* 5.4*  ALBUMIN 2.8* 2.4*   CBG: Recent Labs  Lab 01/04/23 1629 01/04/23 2036 01/05/23 0002 01/05/23 0748 01/05/23 1140  GLUCAP 103* 147* 101* 105* 115*    Discharge time spent: greater than 30 minutes.  Signed: Alba Cory, MD [facility] 01/05/2023

## 2023-01-06 ENCOUNTER — Encounter (HOSPITAL_COMMUNITY): Payer: Self-pay | Admitting: Pulmonary Disease

## 2023-01-06 DIAGNOSIS — I4729 Other ventricular tachycardia: Secondary | ICD-10-CM

## 2023-01-06 DIAGNOSIS — D649 Anemia, unspecified: Secondary | ICD-10-CM | POA: Diagnosis not present

## 2023-01-06 DIAGNOSIS — S7291XA Unspecified fracture of right femur, initial encounter for closed fracture: Secondary | ICD-10-CM | POA: Diagnosis not present

## 2023-01-06 DIAGNOSIS — Z1152 Encounter for screening for COVID-19: Secondary | ICD-10-CM | POA: Diagnosis not present

## 2023-01-06 DIAGNOSIS — J690 Pneumonitis due to inhalation of food and vomit: Secondary | ICD-10-CM | POA: Diagnosis not present

## 2023-01-06 DIAGNOSIS — S72001A Fracture of unspecified part of neck of right femur, initial encounter for closed fracture: Secondary | ICD-10-CM | POA: Diagnosis not present

## 2023-01-06 DIAGNOSIS — E43 Unspecified severe protein-calorie malnutrition: Secondary | ICD-10-CM | POA: Diagnosis not present

## 2023-01-06 DIAGNOSIS — I10 Essential (primary) hypertension: Secondary | ICD-10-CM | POA: Diagnosis not present

## 2023-01-06 DIAGNOSIS — I2510 Atherosclerotic heart disease of native coronary artery without angina pectoris: Secondary | ICD-10-CM | POA: Diagnosis not present

## 2023-01-06 DIAGNOSIS — J449 Chronic obstructive pulmonary disease, unspecified: Secondary | ICD-10-CM | POA: Diagnosis not present

## 2023-01-06 DIAGNOSIS — G629 Polyneuropathy, unspecified: Secondary | ICD-10-CM | POA: Diagnosis not present

## 2023-01-06 DIAGNOSIS — I639 Cerebral infarction, unspecified: Secondary | ICD-10-CM | POA: Diagnosis not present

## 2023-01-06 DIAGNOSIS — T84019A Broken internal joint prosthesis, unspecified site, initial encounter: Secondary | ICD-10-CM | POA: Diagnosis not present

## 2023-01-06 DIAGNOSIS — G928 Other toxic encephalopathy: Secondary | ICD-10-CM | POA: Diagnosis not present

## 2023-01-06 DIAGNOSIS — N179 Acute kidney failure, unspecified: Secondary | ICD-10-CM | POA: Diagnosis not present

## 2023-01-06 DIAGNOSIS — W19XXXD Unspecified fall, subsequent encounter: Secondary | ICD-10-CM | POA: Diagnosis not present

## 2023-01-06 DIAGNOSIS — M6281 Muscle weakness (generalized): Secondary | ICD-10-CM | POA: Diagnosis not present

## 2023-01-06 DIAGNOSIS — J9601 Acute respiratory failure with hypoxia: Secondary | ICD-10-CM | POA: Diagnosis not present

## 2023-01-06 DIAGNOSIS — E119 Type 2 diabetes mellitus without complications: Secondary | ICD-10-CM | POA: Diagnosis not present

## 2023-01-06 DIAGNOSIS — S7291XD Unspecified fracture of right femur, subsequent encounter for closed fracture with routine healing: Secondary | ICD-10-CM | POA: Diagnosis not present

## 2023-01-06 DIAGNOSIS — D638 Anemia in other chronic diseases classified elsewhere: Secondary | ICD-10-CM | POA: Diagnosis not present

## 2023-01-06 DIAGNOSIS — I7143 Infrarenal abdominal aortic aneurysm, without rupture: Secondary | ICD-10-CM | POA: Diagnosis not present

## 2023-01-06 DIAGNOSIS — J440 Chronic obstructive pulmonary disease with acute lower respiratory infection: Secondary | ICD-10-CM | POA: Diagnosis not present

## 2023-01-06 DIAGNOSIS — Z96649 Presence of unspecified artificial hip joint: Secondary | ICD-10-CM | POA: Diagnosis not present

## 2023-01-06 DIAGNOSIS — J9811 Atelectasis: Secondary | ICD-10-CM | POA: Diagnosis not present

## 2023-01-06 DIAGNOSIS — G9340 Encephalopathy, unspecified: Secondary | ICD-10-CM | POA: Diagnosis not present

## 2023-01-06 DIAGNOSIS — E1165 Type 2 diabetes mellitus with hyperglycemia: Secondary | ICD-10-CM | POA: Diagnosis not present

## 2023-01-06 LAB — BASIC METABOLIC PANEL
Anion gap: 10 (ref 5–15)
Anion gap: 11 (ref 5–15)
BUN: 21 mg/dL (ref 8–23)
BUN: 19 mg/dL (ref 8–23)
CO2: 23 mmol/L (ref 22–32)
CO2: 24 mmol/L (ref 22–32)
Calcium: 8.6 mg/dL — ABNORMAL LOW (ref 8.9–10.3)
Calcium: 8.5 mg/dL — ABNORMAL LOW (ref 8.9–10.3)
Chloride: 101 mmol/L (ref 98–111)
Chloride: 101 mmol/L (ref 98–111)
Creatinine, Ser: 0.54 mg/dL (ref 0.44–1.00)
Creatinine, Ser: 0.55 mg/dL (ref 0.44–1.00)
GFR, Estimated: >60 mL/min (ref >60)
GFR, Estimated: >60 mL/min (ref >60)
Glucose, Bld: 112 mg/dL — ABNORMAL HIGH (ref 70–99)
Glucose, Bld: 144 mg/dL — ABNORMAL HIGH (ref 70–99)
Potassium: 4.2 mmol/L (ref 3.5–5.1)
Potassium: 4 mmol/L (ref 3.5–5.1)
Sodium: 134 mmol/L — ABNORMAL LOW (ref 135–145)
Sodium: 136 mmol/L (ref 135–145)

## 2023-01-06 LAB — CBC WITH DIFFERENTIAL/PLATELET
Abs Immature Granulocytes: 0.02 10*3/uL (ref 0.00–0.07)
Basophils Absolute: 0 10*3/uL (ref 0.0–0.1)
Basophils Relative: 1 %
Eosinophils Absolute: 0.1 10*3/uL (ref 0.0–0.5)
Eosinophils Relative: 2 %
HCT: 30.4 % — ABNORMAL LOW (ref 36.0–46.0)
Hemoglobin: 10.4 g/dL — ABNORMAL LOW (ref 12.0–15.0)
Immature Granulocytes: 0 %
Lymphocytes Relative: 24 %
Lymphs Abs: 1.5 10*3/uL (ref 0.7–4.0)
MCH: 31 pg (ref 26.0–34.0)
MCHC: 34.2 g/dL (ref 30.0–36.0)
MCV: 90.7 fL (ref 80.0–100.0)
Monocytes Absolute: 0.6 10*3/uL (ref 0.1–1.0)
Monocytes Relative: 9 %
Neutro Abs: 4 10*3/uL (ref 1.7–7.7)
Neutrophils Relative %: 64 %
Platelets: 251 10*3/uL (ref 150–400)
RBC: 3.35 MIL/uL — ABNORMAL LOW (ref 3.87–5.11)
RDW: 14.2 % (ref 11.5–15.5)
WBC: 6.3 10*3/uL (ref 4.0–10.5)
nRBC: 0 % (ref 0.0–0.2)

## 2023-01-06 LAB — MAGNESIUM
Magnesium: 2.1 mg/dL (ref 1.7–2.4)
Magnesium: 2 mg/dL (ref 1.7–2.4)

## 2023-01-06 MED ORDER — DILTIAZEM HCL ER COATED BEADS 120 MG PO CP24: 120.0000 mg | ORAL_CAPSULE | Freq: Every day | ORAL | Status: DC

## 2023-01-06 MED ORDER — DILTIAZEM HCL ER COATED BEADS 120 MG PO CP24: 120.0000 mg | ORAL_CAPSULE | Freq: Every day | ORAL | 1 refills | Status: DC

## 2023-01-06 NOTE — Care Management Important Message (Signed)
Important Message  Patient Details  Name: [name] MRN: [id number] Date of Birth: [birth year]   Medicare Important Message Given:  Yes     Sherilyn Banker 01/06/2023, 1:35 PM

## 2023-01-06 NOTE — TOC Transition Note (Signed)
Transition of Care [facility]) - CM/SW Discharge Note   Patient Details  Name: LEYLANI DULEY MRN: [id number] Date of Birth: 1945/10/20  Transition of Care [facility]) CM/SW Contact:  Lorri Frederick, LCSW Phone Number: 01/06/2023, 4:35 PM   Clinical Narrative:   Pt discharging to [facility].  RN call report to [phone].    Daughter Herbert Seta will transport, estimated she will be at main north tower entrance around 1745.  Will need pt brought down to her and assistance getting into the vehicle.  Final next level of care: Skilled Nursing Facility Barriers to Discharge: Barriers Resolved   Patient Goals and CMS Choice CMS Medicare.gov Compare Post Acute Care list provided to:: Patient Represenative (must comment) Choice offered to / list presented to : Spouse, Patient  Discharge Placement                Patient [facility] bed at: [facility] Patient to be transferred to facility by: daughter Herbert Seta Name of family member notified: daughter Herbert Seta Patient and family notified of of transfer: 01/06/23  Discharge Plan and Services Additional resources added to the After Visit Summary for   In-house Referral: Clinical Social Work   Post Acute Care Choice: Skilled Nursing Facility                               Social Determinants of Health (SDOH) Interventions SDOH Screenings   Food Insecurity: No Food Insecurity (12/29/2022)  Housing: Low Risk  (12/29/2022)  Transportation Needs: No Transportation Needs (12/29/2022)  Utilities: Not At Risk (12/29/2022)  Tobacco Use: High Risk (01/06/2023)     Readmission Risk Interventions    12/30/2022    3:18 PM 12/08/2022   12:02 PM 11/02/2022   12:01 PM  Readmission Risk Prevention Plan  Transportation Screening Complete Complete Complete  PCP or Specialist Appt within 5-7 Days   Complete  Home Care Screening   Complete  Medication Review (RN CM)   Complete  HRI or Home Care Consult  Complete   Social Work Consult  for Recovery Care Planning/Counseling  Complete   Palliative Care Screening  Not Applicable   Medication Review Oceanographer) Complete Complete   PCP or Specialist appointment within 3-5 days of discharge Complete    HRI or Home Care Consult Complete    SW Recovery Care/Counseling Consult Complete    Palliative Care Screening Not Applicable    Skilled Nursing Facility Complete
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
## 2023-01-06 NOTE — Discharge Summary (Signed)
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 no focal consolidation or pulmonary edema. There is no pleural effusion or pneumothorax. Remote left-sided rib fractures are again seen. There is no acute displaced rib fracture or other acute osseous abnormality. Right axillary surgical clips are again noted. IMPRESSION: Stable chest with no radiographic evidence of acute cardiopulmonary process. Electronically Signed   By: Lesia Hausen M.D.   On: 12/29/2022 09:33    Microbiology: Results for orders placed or performed during the hospital encounter of 12/29/22  Urine Culture     Status: Abnormal   Collection Time: 12/29/22  1:40 PM   Specimen: Urine, Random  Result Value Ref Range Status   Specimen Description URINE, RANDOM  Final   Special Requests   Final    NONE Performed at [facility], [address], [place], [place] [address]    Culture >=100,000 COLONIES/mL ESCHERICHIA COLI (A)  Final   Report Status 12/31/2022  FINAL  Final   Organism ID, Bacteria ESCHERICHIA COLI (A)  Final      Susceptibility   Escherichia coli - MIC*    AMPICILLIN >=32 RESISTANT Resistant     CEFAZOLIN <=4 SENSITIVE Sensitive     CEFEPIME <=0.12 SENSITIVE Sensitive     CEFTRIAXONE <=0.25 SENSITIVE Sensitive     CIPROFLOXACIN <=0.25 SENSITIVE Sensitive     GENTAMICIN <=1 SENSITIVE Sensitive     IMIPENEM <=0.25 SENSITIVE Sensitive     NITROFURANTOIN <=16 SENSITIVE Sensitive     TRIMETH/SULFA <=20 SENSITIVE Sensitive     AMPICILLIN/SULBACTAM 16 INTERMEDIATE Intermediate     PIP/TAZO <=4 SENSITIVE Sensitive     * >=100,000 COLONIES/mL ESCHERICHIA COLI  SARS Coronavirus 2 by RT PCR (hospital order, performed in [facility] hospital lab) *cepheid single result test* Anterior Nasal Swab     Status: None   Collection Time:  01/01/23  9:21 AM   Specimen: Anterior Nasal Swab  Result Value Ref Range Status   SARS Coronavirus 2 by RT PCR NEGATIVE NEGATIVE Final    Comment: Performed at [facility], [address], [place], [place] [address]  Culture, Respiratory w Gram Stain     Status: None   Collection Time: 01/02/23  8:02 AM   Specimen: Bronchoalveolar Lavage; Respiratory  Result Value Ref Range Status   Specimen Description BRONCHIAL ALVEOLAR LAVAGE  Final   Special Requests NONE  Final   Gram Stain   Final    ABUNDANT WBC PRESENT, PREDOMINANTLY PMN FEW YEAST Performed at [facility], [address], [place], [place] [address]    Culture FEW CANDIDA TROPICALIS  Final   Report Status 01/04/2023 FINAL  Final  MRSA Next Gen by PCR, Nasal     Status: None   Collection Time: 01/02/23  9:15 AM   Specimen: Nasal Mucosa; Nasal Swab  Result Value Ref Range Status   MRSA by PCR Next Gen NOT DETECTED NOT DETECTED Final    Comment: (NOTE) The GeneXpert MRSA Assay (FDA approved for NASAL specimens only), is one component of a comprehensive MRSA colonization surveillance program. It is not intended to diagnose MRSA  infection nor to guide or monitor treatment for MRSA infections. Test performance is not FDA approved in patients less than 13 years old. Performed at [facility], [address] [place], [place] [address]     Labs: CBC: Recent Labs  Lab 01/02/23 0330 01/03/23 0114 01/04/23 0223 01/05/23 0255 01/06/23 0148  WBC 8.3 6.2 6.3 5.0 6.3  NEUTROABS  --  4.5 4.6 3.2 4.0  HGB 10.6* 9.4* 9.8* 10.6* 10.4*  HCT 30.6* 28.0* 29.4* 31.9* 30.4*  MCV 89.7 91.8 91.3 92.2 90.7  PLT 187 188 210 247 251   Basic Metabolic Panel: Recent Labs  Lab 01/03/23 0114 01/04/23 0223 01/05/23 0255 01/06/23 0148 01/06/23 0959  NA 135 132* 135 134* 136  K 3.6 3.6 3.4* 4.2 4.0  CL 100 103 104 101 101  CO2 27 23 23 23 24   GLUCOSE 98 126* 113* 112* 144*  BUN 13 13 10 21 19   CREATININE 0.55 0.45 0.42* 0.54 0.55  CALCIUM 8.0* 8.0* 8.2* 8.6* 8.5*  MG 1.7 2.0 1.9 2.1 2.0   Liver Function Tests: Recent Labs  Lab 12/31/22 0343  AST 21  ALT 17  ALKPHOS 55  BILITOT 0.5  PROT 5.4*  ALBUMIN 2.4*   CBG: Recent Labs  Lab 01/04/23 1629 01/04/23 2036 01/05/23 0002 01/05/23 0748 01/05/23 1140  GLUCAP 103* 147* 101* 105* 115*    Discharge time spent: greater than 30 minutes.  Signed: Alba Cory, MD [facility] 01/06/2023
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
## 2023-01-06 NOTE — TOC Progression Note (Addendum)
Transition of Care [facility]) - Progression Note    Patient Details  Name: LILIBETH OPIE MRN: [id number] Date of Birth: 07-14-1946  Transition of [facility]) CM/SW Contact  Lorri Frederick, LCSW Phone Number: 01/06/2023, 8:54 AM  Clinical Narrative:   CSW spoke with pt who reports she does want to continue forward with DC plan for SNF at [facility] request submitted in [place].   1330: Auth approved: [id number], [id number], 3 days: 4/23-4/25.  MD notified.   1420: Pt cleared for DC.  CSW confirmed with [facility] and they can accept pt today.  CSW spoke with pt and then with pt daughter Herbert Seta, who can provide transportation.  RN aware   Expected Discharge Plan: Skilled Nursing Facility Barriers to Discharge: Continued Medical Work up, English as a second language teacher, SNF Pending bed offer  Expected Discharge Plan and Services In-house Referral: Clinical Social Work   Post Acute Care Choice: Skilled Nursing Facility Living arrangements for the past 2 months: Single Family Home Expected Discharge Date: 01/05/23                                     Social Determinants of Health (SDOH) Interventions SDOH Screenings   Food Insecurity: No Food Insecurity (12/29/2022)  Housing: Low Risk  (12/29/2022)  Transportation Needs: No Transportation Needs (12/29/2022)  Utilities: Not At Risk (12/29/2022)  Tobacco Use: High Risk (01/02/2023)    Readmission Risk Interventions    12/30/2022    3:18 PM 12/08/2022   12:02 PM 11/02/2022   12:01 PM  Readmission Risk Prevention Plan  Transportation Screening Complete Complete Complete  PCP or Specialist Appt within 5-7 Days   Complete  Home Care Screening   Complete  Medication Review (RN CM)   Complete  HRI or Home Care Consult  Complete   Social Work Consult for Recovery Care Planning/Counseling  Complete   Palliative Care Screening  Not Applicable   Medication Review Oceanographer) Complete Complete   PCP or Specialist  appointment within 3-5 days of discharge Complete    HRI or Home Care Consult Complete    SW Recovery Care/Counseling Consult Complete    Palliative Care Screening Not Applicable    Skilled Nursing Facility Complete

## 2023-01-07 DIAGNOSIS — D638 Anemia in other chronic diseases classified elsewhere: Secondary | ICD-10-CM | POA: Diagnosis not present

## 2023-01-07 DIAGNOSIS — G629 Polyneuropathy, unspecified: Secondary | ICD-10-CM | POA: Diagnosis not present

## 2023-01-07 DIAGNOSIS — I10 Essential (primary) hypertension: Secondary | ICD-10-CM | POA: Diagnosis not present

## 2023-01-07 DIAGNOSIS — J690 Pneumonitis due to inhalation of food and vomit: Secondary | ICD-10-CM | POA: Diagnosis not present

## 2023-01-07 DIAGNOSIS — I2510 Atherosclerotic heart disease of native coronary artery without angina pectoris: Secondary | ICD-10-CM | POA: Diagnosis not present

## 2023-01-07 DIAGNOSIS — I639 Cerebral infarction, unspecified: Secondary | ICD-10-CM | POA: Diagnosis not present

## 2023-01-07 DIAGNOSIS — E119 Type 2 diabetes mellitus without complications: Secondary | ICD-10-CM | POA: Diagnosis not present

## 2023-01-07 DIAGNOSIS — S7291XD Unspecified fracture of right femur, subsequent encounter for closed fracture with routine healing: Secondary | ICD-10-CM | POA: Diagnosis not present

## 2023-01-07 DIAGNOSIS — J9811 Atelectasis: Secondary | ICD-10-CM | POA: Diagnosis not present

## 2023-01-07 DIAGNOSIS — W19XXXD Unspecified fall, subsequent encounter: Secondary | ICD-10-CM | POA: Diagnosis not present

## 2023-01-07 DIAGNOSIS — J449 Chronic obstructive pulmonary disease, unspecified: Secondary | ICD-10-CM | POA: Diagnosis not present

## 2023-01-08 DIAGNOSIS — J449 Chronic obstructive pulmonary disease, unspecified: Secondary | ICD-10-CM | POA: Diagnosis not present

## 2023-01-08 DIAGNOSIS — D638 Anemia in other chronic diseases classified elsewhere: Secondary | ICD-10-CM | POA: Diagnosis not present

## 2023-01-08 DIAGNOSIS — G629 Polyneuropathy, unspecified: Secondary | ICD-10-CM | POA: Diagnosis not present

## 2023-01-08 DIAGNOSIS — I10 Essential (primary) hypertension: Secondary | ICD-10-CM | POA: Diagnosis not present

## 2023-01-08 DIAGNOSIS — S7291XD Unspecified fracture of right femur, subsequent encounter for closed fracture with routine healing: Secondary | ICD-10-CM | POA: Diagnosis not present

## 2023-01-08 DIAGNOSIS — E119 Type 2 diabetes mellitus without complications: Secondary | ICD-10-CM | POA: Diagnosis not present

## 2023-01-09 DIAGNOSIS — I10 Essential (primary) hypertension: Secondary | ICD-10-CM | POA: Diagnosis not present

## 2023-01-09 DIAGNOSIS — E1165 Type 2 diabetes mellitus with hyperglycemia: Secondary | ICD-10-CM | POA: Diagnosis not present

## 2023-01-09 DIAGNOSIS — G9340 Encephalopathy, unspecified: Secondary | ICD-10-CM | POA: Diagnosis not present

## 2023-01-23 ENCOUNTER — Other Ambulatory Visit: Payer: Self-pay | Admitting: Cardiology

## 2023-01-23 DIAGNOSIS — J449 Chronic obstructive pulmonary disease, unspecified: Secondary | ICD-10-CM | POA: Diagnosis not present

## 2023-01-23 DIAGNOSIS — E119 Type 2 diabetes mellitus without complications: Secondary | ICD-10-CM | POA: Diagnosis not present

## 2023-01-23 DIAGNOSIS — Z09 Encounter for follow-up examination after completed treatment for conditions other than malignant neoplasm: Secondary | ICD-10-CM | POA: Diagnosis not present

## 2023-01-23 DIAGNOSIS — S72001D Fracture of unspecified part of neck of right femur, subsequent encounter for closed fracture with routine healing: Secondary | ICD-10-CM | POA: Diagnosis not present

## 2023-01-23 DIAGNOSIS — Z9181 History of falling: Secondary | ICD-10-CM | POA: Diagnosis not present

## 2023-01-29 DIAGNOSIS — E119 Type 2 diabetes mellitus without complications: Secondary | ICD-10-CM | POA: Diagnosis not present

## 2023-02-10 ENCOUNTER — Other Ambulatory Visit: Payer: Self-pay | Admitting: Cardiology

## 2023-04-08 DIAGNOSIS — E119 Type 2 diabetes mellitus without complications: Secondary | ICD-10-CM | POA: Diagnosis not present

## 2023-04-08 DIAGNOSIS — I10 Essential (primary) hypertension: Secondary | ICD-10-CM | POA: Diagnosis not present

## 2023-04-08 DIAGNOSIS — E1169 Type 2 diabetes mellitus with other specified complication: Secondary | ICD-10-CM | POA: Diagnosis not present

## 2023-04-08 DIAGNOSIS — E1129 Type 2 diabetes mellitus with other diabetic kidney complication: Secondary | ICD-10-CM | POA: Diagnosis not present

## 2023-04-08 DIAGNOSIS — J449 Chronic obstructive pulmonary disease, unspecified: Secondary | ICD-10-CM | POA: Diagnosis not present

## 2023-04-08 DIAGNOSIS — R109 Unspecified abdominal pain: Secondary | ICD-10-CM | POA: Diagnosis not present

## 2023-04-10 ENCOUNTER — Other Ambulatory Visit (HOSPITAL_BASED_OUTPATIENT_CLINIC_OR_DEPARTMENT_OTHER): Payer: Self-pay | Admitting: Family Medicine

## 2023-04-10 DIAGNOSIS — R109 Unspecified abdominal pain: Secondary | ICD-10-CM

## 2023-04-20 ENCOUNTER — Other Ambulatory Visit: Payer: Self-pay | Admitting: Cardiology

## 2023-04-25 ENCOUNTER — Ambulatory Visit (HOSPITAL_BASED_OUTPATIENT_CLINIC_OR_DEPARTMENT_OTHER)
Admission: RE | Admit: 2023-04-25 | Discharge: 2023-04-25 | Disposition: A | Discharge status: Home / Self Care | Payer: Medicare Other | Source: Ambulatory Visit | Attending: Family Medicine | Admitting: Family Medicine

## 2023-04-25 DIAGNOSIS — R109 Unspecified abdominal pain: Secondary | ICD-10-CM | POA: Diagnosis not present

## 2023-04-25 DIAGNOSIS — I7143 Infrarenal abdominal aortic aneurysm, without rupture: Secondary | ICD-10-CM | POA: Diagnosis not present

## 2023-04-25 DIAGNOSIS — K8020 Calculus of gallbladder without cholecystitis without obstruction: Secondary | ICD-10-CM | POA: Diagnosis not present

## 2023-04-25 DIAGNOSIS — N281 Cyst of kidney, acquired: Secondary | ICD-10-CM | POA: Diagnosis not present

## 2023-04-25 LAB — POCT I-STAT CREATININE: Creatinine, Ser: 0.6 mg/dL (ref 0.44–1.00)

## 2023-04-25 MED ORDER — IOHEXOL 300 MG/ML  SOLN
75.0000 mL | Freq: Once | INTRAMUSCULAR | Status: AC | PRN
  Administered 2023-04-25: 75 mL via INTRAVENOUS

## 2023-06-16 DIAGNOSIS — Z23 Encounter for immunization: Secondary | ICD-10-CM | POA: Diagnosis not present

## 2023-06-19 ENCOUNTER — Encounter: Payer: Self-pay | Admitting: Podiatry

## 2023-06-19 ENCOUNTER — Ambulatory Visit (INDEPENDENT_AMBULATORY_CARE_PROVIDER_SITE_OTHER): Payer: Medicare Other | Admitting: Podiatry

## 2023-06-19 DIAGNOSIS — M79676 Pain in unspecified toe(s): Secondary | ICD-10-CM

## 2023-06-19 DIAGNOSIS — E119 Type 2 diabetes mellitus without complications: Secondary | ICD-10-CM | POA: Diagnosis not present

## 2023-06-19 DIAGNOSIS — B351 Tinea unguium: Secondary | ICD-10-CM | POA: Diagnosis not present

## 2023-06-19 NOTE — Progress Notes (Signed)
This patient returns to my office for at risk foot care.  This patient requires this care by a professional since this patient will be at risk due to having type 2 diabetes, raynauds disease and renal failure This patient is unable to cut nails herself since the patient cannot reach her nails.These nails are painful walking and wearing shoes.  This patient presents for at risk foot care today.  General Appearance  Alert, conversant and in no acute stress.  Vascular  Dorsalis pedis and posterior tibial  pulses are weakly palpable  bilaterally.  Capillary return is within normal limits  bilaterally. Temperature is within normal limits  bilaterally.  Neurologic  Senn-Weinstein monofilament wire test within normal limits  bilaterally. Muscle power within normal limits bilaterally.  Nails Thick disfigured discolored nails with subungual debris  from hallux to fifth toes bilaterally. No evidence of bacterial infection or drainage bilaterally.  Orthopedic  No limitations of motion  feet .  No crepitus or effusions noted.  No bony pathology or digital deformities noted.  Skin  normotropic skin with no porokeratosis noted bilaterally.  No signs of infections or ulcers noted.     Onychomycosis  Pain in right toes  Pain in left toes  Consent was obtained for treatment procedures.   Mechanical debridement of nails 1-5  bilaterally performed with a nail nipper.  Filed with dremel without incident.    Return office visit    3 months                  Told patient to return for periodic foot care and evaluation due to potential at risk complications.   Gardiner Barefoot DPM

## 2023-08-07 ENCOUNTER — Other Ambulatory Visit: Payer: Self-pay | Admitting: Cardiology

## 2023-08-18 DIAGNOSIS — S42212A Unspecified displaced fracture of surgical neck of left humerus, initial encounter for closed fracture: Secondary | ICD-10-CM | POA: Diagnosis not present

## 2023-08-25 ENCOUNTER — Encounter (HOSPITAL_COMMUNITY): Payer: Self-pay

## 2023-08-25 ENCOUNTER — Emergency Department (HOSPITAL_BASED_OUTPATIENT_CLINIC_OR_DEPARTMENT_OTHER): Payer: Medicare Other

## 2023-08-25 ENCOUNTER — Emergency Department (HOSPITAL_COMMUNITY)
Admission: EM | Admit: 2023-08-25 | Discharge: 2023-08-25 | Disposition: A | Discharge status: Home / Self Care | Payer: Medicare Other | Attending: Emergency Medicine | Admitting: Emergency Medicine

## 2023-08-25 ENCOUNTER — Other Ambulatory Visit: Payer: Self-pay

## 2023-08-25 DIAGNOSIS — M79602 Pain in left arm: Secondary | ICD-10-CM | POA: Diagnosis not present

## 2023-08-25 DIAGNOSIS — M7989 Other specified soft tissue disorders: Secondary | ICD-10-CM | POA: Diagnosis not present

## 2023-08-25 DIAGNOSIS — R000 Tachycardia, unspecified: Secondary | ICD-10-CM | POA: Diagnosis not present

## 2023-08-25 DIAGNOSIS — J449 Chronic obstructive pulmonary disease, unspecified: Secondary | ICD-10-CM | POA: Insufficient documentation

## 2023-08-25 DIAGNOSIS — Z7984 Long term (current) use of oral hypoglycemic drugs: Secondary | ICD-10-CM | POA: Diagnosis not present

## 2023-08-25 DIAGNOSIS — R062 Wheezing: Secondary | ICD-10-CM | POA: Diagnosis not present

## 2023-08-25 DIAGNOSIS — Z7982 Long term (current) use of aspirin: Secondary | ICD-10-CM | POA: Diagnosis not present

## 2023-08-25 DIAGNOSIS — S42392A Other fracture of shaft of left humerus, initial encounter for closed fracture: Secondary | ICD-10-CM | POA: Diagnosis not present

## 2023-08-25 DIAGNOSIS — G8929 Other chronic pain: Secondary | ICD-10-CM | POA: Insufficient documentation

## 2023-08-25 DIAGNOSIS — M545 Low back pain, unspecified: Secondary | ICD-10-CM | POA: Insufficient documentation

## 2023-08-25 MED ORDER — OXYCODONE HCL 5 MG PO TABS
5.0000 mg | ORAL_TABLET | Freq: Once | ORAL | Status: AC
  Administered 2023-08-25: 5 mg via ORAL
  Filled 2023-08-25: qty 1

## 2023-08-25 MED ORDER — OXYCODONE-ACETAMINOPHEN 5-325 MG PO TABS
2.0000 | ORAL_TABLET | Freq: Once | ORAL | Status: AC
  Administered 2023-08-25: 2 via ORAL
  Filled 2023-08-25: qty 2

## 2023-08-25 MED ORDER — OXYCODONE HCL 5 MG PO TABS: 5.0000 mg | ORAL_TABLET | ORAL | 0 refills | Status: AC | PRN

## 2023-08-25 NOTE — ED Provider Notes (Signed)
[place] EMERGENCY DEPARTMENT AT [facility] Provider Note   CSN: [id number] Arrival date & time: 08/25/23  1537     History  Chief Complaint  Patient presents with   Arm Pain    [name] is a 77 y.o. female with pertinent past medical history of COPD, multiple prior admissions for altered mental status likely secondary to opioid intoxication, chronic pain of the lower back, and recent fall with resulting displaced surgical neck fracture of the left humerus.  She was seen by [facility] on 08/18/2023, was given oxycodone 5 mg every 4-6 hour with tentative plans for follow-up in 2 weeks.  She presented today with left arm pain which she states has become unbearable.  She says she has been taking her medications as prescribed, and has 5-6 oxycodone pills left in her prescription.  She states that the pain is not necessarily any worse.  She does have new edema and weakness in the left hand which she says has developed over the past 3 days.  Denies numbness of the left hand.    Arm Pain Pertinent negatives include no chest pain and no shortness of breath.       Home Medications Prior to Admission medications   Medication Sig Start Date End Date Taking? Authorizing Provider  oxyCODONE (OXY IR/ROXICODONE) 5 MG immediate release tablet Take 1 tablet (5 mg total) by mouth every 4 (four) hours as needed for up to 3 days for severe pain (pain score 7-10). 08/25/23 08/28/23 Yes Lovie Macadamia, MD  albuterol (PROVENTIL HFA;VENTOLIN HFA) 108 (90 BASE) MCG/ACT inhaler Inhale 2 puffs into the lungs every 6 (six) hours as needed for wheezing or shortness of breath.    [provider]  aspirin EC 81 MG tablet Take 1 tablet (81 mg total) by mouth daily. Swallow whole. 11/20/22   Sheikh, Omair Latif, DO  BIOTIN EXTRA STRENGTH PO Take 500 mg by mouth every other day.    [provider]  cholecalciferol (VITAMIN D3) 25 MCG (1000 UNIT) tablet Take 1,000 Units by  mouth daily.    [provider]  clopidogrel (PLAVIX) 75 MG tablet Take 75 mg by mouth daily.    [provider]  diltiazem (CARDIZEM CD) 120 MG 24 hr capsule Take 1 capsule (120 mg total) by mouth daily. 01/07/23   Regalado, Belkys A, MD  enoxaparin (LOVENOX) 30 MG/0.3ML injection Inject 0.3 mLs (30 mg total) into the skin daily for 14 days. 01/06/23 01/20/23  Regalado, Belkys A, MD  furosemide (LASIX) 20 MG tablet Take 20 mg by mouth in the morning.    [provider]  gabapentin (NEURONTIN) 100 MG capsule Take 1 capsule (100 mg total) by mouth 2 (two) times daily. 12/10/22 12/10/23  Burnadette Pop, MD  guaiFENesin (MUCINEX) 600 MG 12 hr tablet Take 1 tablet (600 mg total) by mouth 2 (two) times daily. 01/05/23   Regalado, Belkys A, MD  ipratropium-albuterol (DUONEB) 0.5-2.5 (3) MG/3ML SOLN Take 3 mLs by nebulization 2 (two) times daily. 01/05/23   Regalado, Belkys A, MD  lidocaine (LIDODERM) 5 % Place 1 patch onto the skin daily. Remove & Discard patch within 12 hours or as directed by MD 01/05/23   Regalado, Jon Billings A, MD  losartan (COZAAR) 50 MG tablet TAKE 1 TABLET BY MOUTH EVERY DAY 08/07/23   [place], Peter M, MD  metFORMIN (GLUCOPHAGE-XR) 500 MG 24 hr tablet Take 500 mg by mouth daily with breakfast.    [provider]  methocarbamol (  ROBAXIN) 500 MG tablet Take 500 mg by mouth daily as needed for muscle spasms. 09/05/22   [provider]  Multiple Vitamins-Minerals (CENTRUM SILVER 50+WOMEN) TABS Take 1 tablet by mouth daily with breakfast.    [provider]  ondansetron (ZOFRAN) 4 MG tablet Take 1 tablet (4 mg total) by mouth every 6 (six) hours as needed for nausea. 11/19/22   Marguerita Merles Latif, DO  rosuvastatin (CRESTOR) 10 MG tablet TAKE 1 TABLET BY MOUTH EVERY DAY 04/20/23   [place], Peter M, MD  sodium chloride HYPERTONIC 3 % nebulizer solution Take 4 mLs by nebulization every 12 (twelve) hours. 01/05/23   Regalado, Belkys A, MD  TYLENOL 500 MG  tablet Take 1,000 mg by mouth every 6 (six) hours as needed for headache or mild pain.    [provider]      Allergies    Cefuroxime, Cephalexin, Chlorhexidine, Prednisone, and Zithromax [azithromycin dihydrate]    Review of Systems   Review of Systems  Respiratory:  Positive for cough and wheezing. Negative for shortness of breath.   Cardiovascular:  Negative for chest pain.  Musculoskeletal:  Positive for joint swelling and myalgias.  Neurological:  Negative for numbness.    Physical Exam Updated Vital Signs BP 130/70 (BP Location: Right Arm)   Pulse 80   Temp 98.1 F (36.7 C) (Oral)   Resp 18   Ht 5\' 2"  (1.575 m)   Wt 54.4 kg   SpO2 96%   BMI 21.94 kg/m  Physical Exam Cardiovascular:     Rate and Rhythm: Tachycardia present.     Pulses: Normal pulses.     Heart sounds: Normal heart sounds.  Pulmonary:     Breath sounds: Wheezing present.  Musculoskeletal:     Comments: Left upper extremity pain most pronounced at the left humerus.  The left arm and hand is edematous. Radial pulses intact.  Neurological:     Comments: Left hand weakness with grip as well as resisted finger abduction.  No numbness or sensory deficits appreciated in the left hand.     ED Results / Procedures / Treatments   Labs (all labs ordered are listed, but only abnormal results are displayed) Labs Reviewed - No data to display  EKG None  Radiology VAS [place] UPPER EXTREMITY VENOUS DUPLEX  Result Date: 08/25/2023 UPPER VENOUS STUDY  Patient Name:  ZEPPELIN STINNETT  Date of Exam:   08/25/2023 Medical Rec #: [id number]       Accession #:    [id number] Date of Birth: 05/01/1946       Patient Gender: F Patient Age:   71 years Exam Location:  [facility] Procedure:      VAS [place] UPPER EXTREMITY VENOUS DUPLEX Referring Phys: Almeta Monas COUNTRYMAN --------------------------------------------------------------------------------  Indications: Swelling, Edema, Pain, and Erythema Other Indications:  Fracture left shoulder. Comparison Study: No prior exam. Performing Technologist: Fernande Bras  Examination Guidelines: A complete evaluation includes B-mode imaging, spectral Doppler, color Doppler, and power Doppler as needed of all accessible portions of each vessel. Bilateral testing is considered an integral part of a complete examination. Limited examinations for reoccurring indications may be performed as noted.  Right Findings: +----------+------------+---------+-----------+----------+-------+ RIGHT     CompressiblePhasicitySpontaneousPropertiesSummary +----------+------------+---------+-----------+----------+-------+ Subclavian    Full       Yes       Yes                      +----------+------------+---------+-----------+----------+-------+  Left Findings: +----------+------------+---------+-----------+----------+--------------+  LEFT      CompressiblePhasicitySpontaneousProperties   Summary     +----------+------------+---------+-----------+----------+--------------+ IJV           Full       Yes       Yes                             +----------+------------+---------+-----------+----------+--------------+ Subclavian    Full       Yes       Yes                             +----------+------------+---------+-----------+----------+--------------+ Axillary                                            Not visualized +----------+------------+---------+-----------+----------+--------------+ Brachial                                            Not visualized +----------+------------+---------+-----------+----------+--------------+ Radial                                              Not visualized +----------+------------+---------+-----------+----------+--------------+ Ulnar                                               Not visualized +----------+------------+---------+-----------+----------+--------------+ Cephalic      Full       Yes        Yes                             +----------+------------+---------+-----------+----------+--------------+ Basilic                                             Not visualized +----------+------------+---------+-----------+----------+--------------+ Patient unable to externally rotate arm due to dislocated shoulder. Limited study. Dr. made aware.  Summary:  Right: No evidence of thrombosis in the subclavian.  *See table(s) above for measurements and observations.     Preliminary     Procedures Procedures    Medications Ordered in ED Medications  oxyCODONE-acetaminophen (PERCOCET/ROXICET) 5-325 MG per tablet 2 tablet (2 tablets Oral Given 08/25/23 1729)  oxyCODONE (Oxy IR/ROXICODONE) immediate release tablet 5 mg (5 mg Oral Given 08/25/23 2041)    ED Course/ Medical Decision Making/ A&P Clinical Course as of 08/25/23 2347  Tue Aug 25, 2023  1708 Stable 60 YOF with a chief complaint of arm pain Larey Seat last week. Left humerus fracture. Pain uncontrolled after completing her pain meds [CC]  1728 Long conversation with patient and family at bedside. Pain is chronic in nature and patient spent years on 30 mg 3 times daily MS IR and 15 mg slow release oxycodone.  Had to be rapidly down titrated in the outpatient setting due to multiple presentations for altered mental status secondary to narcotic intoxication. She appears to be hyperalgesic  secondary to her longstanding narcotic exposure.  Will refill her appropriate dose narcotic for an additional 5 days per the [place] stop ACT with her understanding that she will need to follow-up with PCP and orthopedics for any further pain medication administration and should be down titrated back to her normal regimen as soon as possible.  Patient frustrated that we cannot do surgery today.  I educated her on the process and that she needs to continue following up with her orthopedist.  [CC]    Clinical Course User Index [CC] Glyn Ade, MD                                  Medical Decision Making This is a 78 year old female with multiple comorbidities presenting with left upper extremity pain in the setting of known humeral fracture.  She is being managed by [facility], who elected for nonoperative management and close follow-up.  She says that her pain has become unbearable, though she does admit that the pain is not gotten significantly worse.  She does have new onset edema of the left hand as well as some weakness of the left hand.  I imagine much of this is dependent edema in the setting of known fracture, however we will go ahead and get a DVT study in order to rule out upper extremity DVT in this patient with known risk factors for venous thrombus.  Her pain is difficult to control, she does have history of chronic pain and likely does have some aspect of medication tolerance.  Sadly the patient has multiple admissions for altered mental status in the setting of opioid intoxication, complicating pain management.  The patient states she tried to contact the orthopedist today, but is unable to get hold of them.  We will go ahead and give her 10 mg Percocet while she is here, and likely will discharge with additional short course opioid regimen to bridge her to her next visit.  I would recommend that the patient attempt to move up her orthopedics appointment if possible.  DVT study negative, but limited due to pain. Will place patient in new sling and discharge home with short course oxycodone.  Amount and/or Complexity of Data Reviewed External Data Reviewed: notes.    Details: Reviewed [facility] note from 08/18/2023.  Risk Prescription drug management.           Final Clinical Impression(s) / ED Diagnoses Final diagnoses:  Left arm pain    Rx / DC Orders ED Discharge Orders          Ordered    oxyCODONE (OXY IR/ROXICODONE) 5 MG immediate release tablet  Every 4 hours PRN        08/25/23 2023               Lovie Macadamia, MD 08/25/23 2956    Glyn Ade, MD 08/26/23 0008

## 2023-08-25 NOTE — Discharge Instructions (Addendum)
You were seen today for pain of the left shoulder.  Due to your swelling in your left hand and pain we got a study to make sure there is no blood clot in the left arm.  This study did not show any blood clot, but was limited in how much we could see due to pain.   Please contact your orthopedist and your PCP as soon as possible to follow-up with them for management of your left shoulder fracture and ongoing pain.   Please continue to take your inhalers and breathing treatments.  If you have worsening cough or shortness of breath please seek medical attention.  If you have any worsening weakness, numbness, or if you are arm becomes pale and painful these may all be signs to seek additional medical attention.

## 2023-08-25 NOTE — Progress Notes (Signed)
Left upper extremity venous has been completed Results can be found in chart review under CV Proc.  08/25/2023 8:30 PM  Fernande Bras, RVT.

## 2023-08-25 NOTE — ED Triage Notes (Signed)
Patient reports she fell last week and fractured her left shoulder. Today she is having increased pain and swelling. Took home pain meds without relief. Patient's arm is in a sling at time of triage. Extremity is warm to the touch and pulse is palpable.

## 2023-08-25 NOTE — ED Provider Notes (Incomplete)
Resident

## 2023-08-26 ENCOUNTER — Other Ambulatory Visit: Payer: Self-pay

## 2023-08-26 ENCOUNTER — Emergency Department (HOSPITAL_COMMUNITY)
Admission: EM | Admit: 2023-08-26 | Discharge: 2023-08-26 | Discharge status: Against Medical Advice | Payer: Medicare Other | Attending: Emergency Medicine | Admitting: Emergency Medicine

## 2023-08-26 ENCOUNTER — Encounter (HOSPITAL_COMMUNITY): Payer: Self-pay

## 2023-08-26 DIAGNOSIS — M79642 Pain in left hand: Secondary | ICD-10-CM | POA: Diagnosis not present

## 2023-08-26 DIAGNOSIS — Z5321 Procedure and treatment not carried out due to patient leaving prior to being seen by health care provider: Secondary | ICD-10-CM | POA: Insufficient documentation

## 2023-08-26 DIAGNOSIS — M79602 Pain in left arm: Secondary | ICD-10-CM | POA: Diagnosis not present

## 2023-08-26 DIAGNOSIS — M7989 Other specified soft tissue disorders: Secondary | ICD-10-CM | POA: Diagnosis not present

## 2023-08-26 LAB — CBC WITH DIFFERENTIAL/PLATELET
Abs Immature Granulocytes: 0.02 10*3/uL (ref 0.00–0.07)
Basophils Absolute: 0.1 10*3/uL (ref 0.0–0.1)
Basophils Relative: 1 %
Eosinophils Absolute: 0.2 10*3/uL (ref 0.0–0.5)
Eosinophils Relative: 2 %
HCT: 33.3 % — ABNORMAL LOW (ref 36.0–46.0)
Hemoglobin: 10.9 g/dL — ABNORMAL LOW (ref 12.0–15.0)
Immature Granulocytes: 0 %
Lymphocytes Relative: 18 %
Lymphs Abs: 1.3 10*3/uL (ref 0.7–4.0)
MCH: 30.7 pg (ref 26.0–34.0)
MCHC: 32.7 g/dL (ref 30.0–36.0)
MCV: 93.8 fL (ref 80.0–100.0)
Monocytes Absolute: 0.7 10*3/uL (ref 0.1–1.0)
Monocytes Relative: 11 %
Neutro Abs: 4.8 10*3/uL (ref 1.7–7.7)
Neutrophils Relative %: 68 %
Platelets: 294 10*3/uL (ref 150–400)
RBC: 3.55 MIL/uL — ABNORMAL LOW (ref 3.87–5.11)
RDW: 14.6 % (ref 11.5–15.5)
WBC: 7.1 10*3/uL (ref 4.0–10.5)
nRBC: 0 % (ref 0.0–0.2)

## 2023-08-26 LAB — BASIC METABOLIC PANEL
Anion gap: 16 — ABNORMAL HIGH (ref 5–15)
BUN: 17 mg/dL (ref 8–23)
CO2: 25 mmol/L (ref 22–32)
Calcium: 9.6 mg/dL (ref 8.9–10.3)
Chloride: 97 mmol/L — ABNORMAL LOW (ref 98–111)
Creatinine, Ser: 0.87 mg/dL (ref 0.44–1.00)
GFR, Estimated: >60 mL/min (ref >60)
Glucose, Bld: 157 mg/dL — ABNORMAL HIGH (ref 70–99)
Potassium: 3.7 mmol/L (ref 3.5–5.1)
Sodium: 138 mmol/L (ref 135–145)

## 2023-08-26 MED ORDER — OXYCODONE-ACETAMINOPHEN 5-325 MG PO TABS: 2.0000 | ORAL_TABLET | Freq: Once | ORAL | Status: DC

## 2023-08-26 NOTE — ED Triage Notes (Signed)
Pt came to ED for left hand swelling. Pt has broken left shoulder. Left hand is swollen and bruised.

## 2023-08-26 NOTE — ED Notes (Signed)
Patient has decided to leave AMA.  

## 2023-08-26 NOTE — ED Provider Triage Note (Signed)
Emergency Medicine Provider Triage Evaluation Note  [name] , a 77 y.o. female  was evaluated in triage.  Pt complains of arm pain. Pt broke her shoulder a week ago.  Now having progressive worsening pain, swelling and tingling sensation down her L arm.  Seen in the ER yesterday for same and was given pain medication. Pain is not adequately controlled.  Next ortho appointment is 1 week from today.    Review of Systems  Positive: As above Negative: As above  Physical Exam  BP (!) 144/88   Pulse 97   Temp 97.9 F (36.6 C)   Resp 17   Ht 5' (1.524 m)   Wt 59 kg   SpO2 90%   BMI 25.39 kg/m  Gen:   Awake, no distress   Resp:  Normal effort  MSK:   Moves extremities without difficulty  Other:    Medical Decision Making  Medically screening exam initiated at 5:07 PM.  Appropriate orders placed.  [name] was informed that the remainder of the evaluation will be completed by another provider, this initial triage assessment does not replace that evaluation, and the importance of remaining in the ED until their evaluation is complete.  May need admission for pain control.   Fayrene Helper, PA-C 08/26/23 1708

## 2023-08-30 ENCOUNTER — Other Ambulatory Visit: Payer: Self-pay | Admitting: Cardiology

## 2023-09-02 DIAGNOSIS — S42212A Unspecified displaced fracture of surgical neck of left humerus, initial encounter for closed fracture: Secondary | ICD-10-CM | POA: Diagnosis not present

## 2023-09-03 ENCOUNTER — Telehealth: Payer: Self-pay | Admitting: *Deleted

## 2023-09-03 NOTE — Telephone Encounter (Signed)
   Name: [name]  DOB: [birth year]  MRN: [id number]  Primary Cardiologist: Peter [place], MD  Chart reviewed as part of pre-operative protocol coverage. Because of [name]'s past medical history and time since last visit, she will require a follow-up in-office visit in order to better assess preoperative cardiovascular risk.  Pre-op covering staff: - Please schedule appointment and call patient to inform them. If patient already had an upcoming appointment within acceptable timeframe, please add "pre-op clearance" to the appointment notes so provider is aware. - Please contact requesting surgeon's office via preferred method (i.e, phone, fax) to inform them of need for appointment prior to surgery.   Napoleon Form, Leodis Rains, NP  09/03/2023, 8:08 AM

## 2023-09-03 NOTE — Telephone Encounter (Signed)
1st attempt to reach pt regarding surgical clearance and the need for an IN ONFFICE appointment.  No answer / no voicemail.

## 2023-09-03 NOTE — Telephone Encounter (Signed)
   Pre-operative Risk Assessment    Patient Name: [name]  DOB: [birth year] MRN: [id number]      Request for Surgical Clearance    Procedure:   LEFT SHOULDER REVERSE TOTAL SHOULDER ARTHROPLASTY DUE TO FRACTURE  Date of Surgery:  Clearance 09/18/23                                 Surgeon:  DR. Duwayne Heck Surgeon's Group or Practice Name:  [facility] Phone number:  [phone] Fax number:  [phone]   Type of Clearance Requested:   - Medical  - Pharmacy:  Hold Aspirin and Clopidogrel (Plavix) NOT INDICATED HOW LONG   Type of Anesthesia:   CHOICE   Additional requests/questions:    Wilhemina Cash   09/03/2023, 7:33 AM

## 2023-09-07 ENCOUNTER — Telehealth: Payer: Self-pay | Admitting: *Deleted

## 2023-09-07 NOTE — Telephone Encounter (Signed)
S/w pt made in office visit for Dec 26. Pt is aware of location and provider. Pre op added to appt notes. Will remove from pre op pool.

## 2023-09-07 NOTE — Telephone Encounter (Signed)
S/w pt stated pt don't drive, spouse does not drive. Pt can't move so how is pt supposed to come in for office visit. Explained pt can call [facility] and get back to office if pt can't make it in.

## 2023-09-08 ENCOUNTER — Encounter (HOSPITAL_COMMUNITY): Payer: Self-pay

## 2023-09-08 NOTE — Progress Notes (Signed)
Surgical Instructions   Your procedure is scheduled on Friday September 18, 2023. Report to [facility] Main Entrance "A" at 11:00 A.M., then check in with the Admitting office. Any questions or running late day of surgery: call [phone]  Questions prior to your surgery date: call [phone], Monday-Friday, 8am-4pm. If you experience any cold or flu symptoms such as cough, fever, chills, shortness of breath, etc. between now and your scheduled surgery, please notify us at the above number.     Remember:  Do not eat after midnight the night before your surgery  You may drink clear liquids until 10:00 the morning of your surgery.   Clear liquids allowed are: Water, Non-Citrus Juices (without pulp), Carbonated Beverages, Clear Tea (no milk, honey, etc.), Black Coffee Only (NO MILK, CREAM OR POWDERED CREAMER of any kind), and Gatorade.  Patient Instructions  The night before surgery:  No food after midnight. ONLY clear liquids after midnight  The day of surgery (if you have diabetes): Drink ONE (1) 12 oz G2 given to you in your pre admission testing appointment by 10:00 the morning of surgery. Drink in one sitting. Do not sip.  This drink was given to you during your hospital  pre-op appointment visit.  Nothing else to drink after completing the  12 oz bottle of G2.         If you have questions, please contact your surgeon's office.    Take these medicines the morning of surgery with A SIP OF WATER  amLODipine (NORVASC)  diltiazem (CARDIZEM CD)  rosuvastatin (CRESTOR)   May take these medicines IF NEEDED: albuterol (PROVENTIL HFA;VENTOLIN HFA) 108 (90 BASE) MCG/ACT inhaler.  Please bring with you to the hospital.   gabapentin (NEURONTIN)  ipratropium-albuterol (DUONEB) 0.5-2.5 (3) MG/3ML SOLN  methocarbamol (ROBAXIN)  ondansetron (ZOFRAN)  oxyCODONE-acetaminophen (PERCOCET)  TYLENOL   Follow your surgeon's instructions on when to stop Asprin and clopidogrel (PLAVIX) .  If  no instructions were given by your surgeon then you will need to call the office to get those instructions.     One week prior to surgery, STOP taking any Aspirin (unless otherwise instructed by your surgeon) Aleve, Naproxen, Ibuprofen, Motrin, Advil, Goody's, BC's, all herbal medications, fish oil, and non-prescription vitamins.  WHAT DO I DO ABOUT MY DIABETES MEDICATION?   Do not take oral diabetes medicines (pills) the morning of surgery.        DO NOT TAKE YOUR metFORMIN (GLUCOPHAGE-XR) THE MORNING OF SURGERY.     The day of surgery, do not take other diabetes injectables, including Byetta (exenatide), Bydureon (exenatide ER), Victoza (liraglutide), or Trulicity (dulaglutide).  If your CBG is greater than 220 mg/dL, you may take  of your sliding scale (correction) dose of insulin.   HOW TO MANAGE YOUR DIABETES BEFORE AND AFTER SURGERY  Why is it important to control my blood sugar before and after surgery? Improving blood sugar levels before and after surgery helps healing and can limit problems. A way of improving blood sugar control is eating a healthy diet by:  Eating less sugar and carbohydrates  Increasing activity/exercise  Talking with your doctor about reaching your blood sugar goals High blood sugars (greater than 180 mg/dL) can raise your risk of infections and slow your recovery, so you will need to focus on controlling your diabetes during the weeks before surgery. Make sure that the doctor who takes care of your diabetes knows about your planned surgery including the date and location.  How do I  manage my blood sugar before surgery? Check your blood sugar at least 4 times a day, starting 2 days before surgery, to make sure that the level is not too high or low.  Check your blood sugar the morning of your surgery when you wake up and every 2 hours until you get to the Short Stay unit.  If your blood sugar is less than 70 mg/dL, you will need to treat for low blood  sugar: Do not take insulin. Treat a low blood sugar (less than 70 mg/dL) with  cup of clear juice (cranberry or apple), 4 glucose tablets, OR glucose gel. Recheck blood sugar in 15 minutes after treatment (to make sure it is greater than 70 mg/dL). If your blood sugar is not greater than 70 mg/dL on recheck, call [phone] for further instructions. Report your blood sugar to the short stay nurse when you get to Short Stay.  If you are admitted to the hospital after surgery: Your blood sugar will be checked by the staff and you will probably be given insulin after surgery (instead of oral diabetes medicines) to make sure you have good blood sugar levels. The goal for blood sugar control after surgery is 80-180 mg/dL.                      Do NOT Smoke (Tobacco/Vaping) for 24 hours prior to your procedure.  If you use a CPAP at night, you may bring your mask/headgear for your overnight stay.   You will be asked to remove any contacts, glasses, piercing's, hearing aid's, dentures/partials prior to surgery. Please bring cases for these items if needed.    Patients discharged the day of surgery will not be allowed to drive home, and someone needs to stay with them for 24 hours.  SURGICAL WAITING ROOM VISITATION Patients may have no more than 2 support people in the waiting area - these visitors may rotate.   Pre-op nurse will coordinate an appropriate time for 1 ADULT support person, who may not rotate, to accompany patient in pre-op.  Children under the age of 18 must have an adult with them who is not the patient and must remain in the main waiting area with an adult.  If the patient needs to stay at the hospital during part of their recovery, the visitor guidelines for inpatient rooms apply.  Please refer to the [facility] website for the visitor guidelines for any additional information.   If you received a COVID test during your pre-op visit  it is requested that you wear a mask  when out in public, stay away from anyone that may not be feeling well and notify your surgeon if you develop symptoms. If you have been in contact with anyone that has tested positive in the last 10 days please notify you surgeon.      [facility]- Preparing for Total Shoulder Arthroplasty  Before surgery, you can play an important role. Because skin is not sterile, your skin needs to be as free of germs as possible. You can reduce the number of germs on your skin by using the following products.   Benzoyl Peroxide Gel  o Reduces the number of germs present on the skin  o Applied twice a day to shoulder area starting two days before surgery   Chlorhexidine Gluconate (CHG) Soap (instructions listed above on how to wash with CHG Soap)  o An antiseptic cleaner that kills germs and bonds with the skin to  continue killing germs even after washing  o Used for showering the night before surgery and morning of surgery   ==================================================================  Please follow these instructions carefully:  BENZOYL PEROXIDE 5% GEL  Please do not use if you have an allergy to benzoyl peroxide. If your skin becomes reddened/irritated stop using the benzoyl peroxide.  Starting two days before surgery, apply as follows:  1. Apply benzoyl peroxide in the morning and at night. Apply after taking a shower. If you are not taking a shower clean entire shoulder front, back, and side along with the armpit with a clean wet washcloth.  2. Place a quarter-sized dollop on your SHOULDER and rub in thoroughly, making sure to cover the front, back, and side of your shoulder, along with the armpit.   2 Days prior to Surgery First Dose on _____________ Morning Second Dose on ______________ Night  Day Before Surgery First Dose on ______________ Morning Night before surgery wash (entire body except face and private areas) with CHG Soap THEN Second Dose on ____________ Night    Morning of Surgery  wash BODY AGAIN with CHG Soap   4. Do NOT apply benzoyl peroxide gel on the day of surgery       Pre-operative 5 CHG Bathing Instructions   You can play a key role in reducing the risk of infection after surgery. Your skin needs to be as free of germs as possible. You can reduce the number of germs on your skin by washing with CHG (chlorhexidine gluconate) soap before surgery. CHG is an antiseptic soap that kills germs and continues to kill germs even after washing.   DO NOT use if you have an allergy to chlorhexidine/CHG or antibacterial soaps. If your skin becomes reddened or irritated, stop using the CHG and notify one of our RNs at [phone].   Please shower with the CHG soap starting 4 days before surgery using the following schedule:     Please keep in mind the following:  DO NOT shave, including legs and underarms, starting the day of your first shower.   You may shave your face at any point before/day of surgery.  Place clean sheets on your bed the day you start using CHG soap. Use a clean washcloth (not used since being washed) for each shower. DO NOT sleep with pets once you start using the CHG.   CHG Shower Instructions:  Wash your face and private area with normal soap. If you choose to wash your hair, wash first with your normal shampoo.  After you use shampoo/soap, rinse your hair and body thoroughly to remove shampoo/soap residue.  Turn the water OFF and apply about 3 tablespoons (45 ml) of CHG soap to a CLEAN washcloth.  Apply CHG soap ONLY FROM YOUR NECK DOWN TO YOUR TOES (washing for 3-5 minutes)  DO NOT use CHG soap on face, private areas, open wounds, or sores.  Pay special attention to the area where your surgery is being performed.  If you are having back surgery, having someone wash your back for you may be helpful. Wait 2 minutes after CHG soap is applied, then you may rinse off the CHG soap.  Pat dry with a clean towel  Put on  clean clothes/pajamas   If you choose to wear lotion, please use ONLY the CHG-compatible lotions on the back of this paper.   Additional instructions for the day of surgery: DO NOT APPLY any lotions, deodorants or perfumes.   Do not bring valuables to  the hospital. [facility] is not responsible for any belongings/valuables. Do not wear nail polish, gel polish, artificial nails, or any other type of covering on natural nails (fingers and toes) Do not wear jewelry or makeup Put on clean/comfortable clothes.  Please brush your teeth.  Ask your nurse before applying any prescription medications to the skin.     CHG Compatible Lotions   Aveeno Moisturizing lotion  Cetaphil Moisturizing Cream  Cetaphil Moisturizing Lotion  Clairol Herbal Essence Moisturizing Lotion, Dry Skin  Clairol Herbal Essence Moisturizing Lotion, Extra Dry Skin  Clairol Herbal Essence Moisturizing Lotion, Normal Skin  Curel Age Defying Therapeutic Moisturizing Lotion with Alpha Hydroxy  Curel Extreme Care Body Lotion  Curel Soothing Hands Moisturizing Hand Lotion  Curel Therapeutic Moisturizing Cream, Fragrance-Free  Curel Therapeutic Moisturizing Lotion, Fragrance-Free  Curel Therapeutic Moisturizing Lotion, Original Formula  Eucerin Daily Replenishing Lotion  Eucerin Dry Skin Therapy Plus Alpha Hydroxy Crme  Eucerin Dry Skin Therapy Plus Alpha Hydroxy Lotion  Eucerin Original Crme  Eucerin Original Lotion  Eucerin Plus Crme Eucerin Plus Lotion  Eucerin TriLipid Replenishing Lotion  Keri Anti-Bacterial Hand Lotion  Keri Deep Conditioning Original Lotion Dry Skin Formula Softly Scented  Keri Deep Conditioning Original Lotion, Fragrance Free Sensitive Skin Formula  Keri Lotion Fast Absorbing Fragrance Free Sensitive Skin Formula  Keri Lotion Fast Absorbing Softly Scented Dry Skin Formula  Keri Original Lotion  Keri Skin Renewal Lotion Keri Silky Smooth Lotion  Keri Silky Smooth Sensitive Skin Lotion   Nivea Body Creamy Conditioning Oil  Nivea Body Extra Enriched Lotion  Nivea Body Original Lotion  Nivea Body Sheer Moisturizing Lotion Nivea Crme  Nivea Skin Firming Lotion  NutraDerm 30 Skin Lotion  NutraDerm Skin Lotion  NutraDerm Therapeutic Skin Cream  NutraDerm Therapeutic Skin Lotion  ProShield Protective Hand Cream  Provon moisturizing lotion  Please read over the following fact sheets that you were given.

## 2023-09-09 ENCOUNTER — Other Ambulatory Visit: Payer: Self-pay | Admitting: Cardiology
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
## 2023-09-10 ENCOUNTER — Ambulatory Visit: Payer: Medicare Other | Attending: Physician Assistant | Admitting: Physician Assistant

## 2023-09-10 ENCOUNTER — Encounter: Payer: Self-pay | Admitting: Physician Assistant

## 2023-09-10 VITALS — BP 116/78 | HR 86 | Ht 61.0 in | Wt 135.0 lb

## 2023-09-10 DIAGNOSIS — Z01818 Encounter for other preprocedural examination: Secondary | ICD-10-CM

## 2023-09-10 DIAGNOSIS — I2510 Atherosclerotic heart disease of native coronary artery without angina pectoris: Secondary | ICD-10-CM

## 2023-09-10 DIAGNOSIS — I10 Essential (primary) hypertension: Secondary | ICD-10-CM

## 2023-09-10 NOTE — Patient Instructions (Addendum)
Medication Instructions:  Your physician recommends that you continue on your current medications as directed. Please refer to the Current Medication list given to you today.   *If you need a refill on your cardiac medications before your next appointment, please call your pharmacy*   Lab Work:  NONE ORDERED  TODAY    If you have labs (blood work) drawn today and your tests are completely normal, you will receive your results only by: MyChart Message (if you have MyChart) OR A paper copy in the mail If you have any lab test that is abnormal or we need to change your treatment, we will call you to review the results.   Testing/Procedures: NONE ORDERED  TODAY    Follow-Up: At [facility], you and your health needs are our priority.  As part of our continuing mission to provide you with exceptional heart care, we have created designated Provider Care Teams.  These Care Teams include your primary Cardiologist (physician) and Advanced Practice Providers (APPs -  Physician Assistants and Nurse Practitioners) who all work together to provide you with the care you need, when you need it.  We recommend signing up for the patient portal called "MyChart".  Sign up information is provided on this After Visit Summary.  MyChart is used to connect with patients for Virtual Visits (Telemedicine).  Patients are able to view lab/test results, encounter notes, upcoming appointments, etc.  Non-urgent messages can be sent to your provider as well.   To learn more about what you can do with MyChart, go to [email or web address].    Your next appointment:  4 MONTHS OR NEXT AVAILABLE ( RECALL JUNE 2024)   Provider:   Peter Swaziland, MD  /APP   Other Instructions

## 2023-09-11 ENCOUNTER — Encounter (HOSPITAL_COMMUNITY): Payer: Self-pay

## 2023-09-11 ENCOUNTER — Encounter (HOSPITAL_COMMUNITY)
Admission: RE | Admit: 2023-09-11 | Discharge: 2023-09-11 | Disposition: A | Discharge status: Home / Self Care | Payer: Medicare Other | Source: Ambulatory Visit | Attending: Orthopedic Surgery

## 2023-09-11 ENCOUNTER — Telehealth: Payer: Self-pay | Admitting: Cardiology

## 2023-09-11 ENCOUNTER — Other Ambulatory Visit: Payer: Self-pay

## 2023-09-11 VITALS — BP 134/86 | HR 106 | Temp 98.3°F | Resp 18 | Ht 61.0 in | Wt 122.9 lb

## 2023-09-11 DIAGNOSIS — Z7982 Long term (current) use of aspirin: Secondary | ICD-10-CM | POA: Insufficient documentation

## 2023-09-11 DIAGNOSIS — Z01812 Encounter for preprocedural laboratory examination: Secondary | ICD-10-CM | POA: Insufficient documentation

## 2023-09-11 DIAGNOSIS — I4819 Other persistent atrial fibrillation: Secondary | ICD-10-CM | POA: Diagnosis not present

## 2023-09-11 DIAGNOSIS — I10 Essential (primary) hypertension: Secondary | ICD-10-CM | POA: Insufficient documentation

## 2023-09-11 DIAGNOSIS — Z7902 Long term (current) use of antithrombotics/antiplatelets: Secondary | ICD-10-CM | POA: Insufficient documentation

## 2023-09-11 DIAGNOSIS — E785 Hyperlipidemia, unspecified: Secondary | ICD-10-CM | POA: Insufficient documentation

## 2023-09-11 DIAGNOSIS — J449 Chronic obstructive pulmonary disease, unspecified: Secondary | ICD-10-CM | POA: Diagnosis not present

## 2023-09-11 DIAGNOSIS — M549 Dorsalgia, unspecified: Secondary | ICD-10-CM | POA: Diagnosis not present

## 2023-09-11 DIAGNOSIS — Z01818 Encounter for other preprocedural examination: Secondary | ICD-10-CM

## 2023-09-11 DIAGNOSIS — F17210 Nicotine dependence, cigarettes, uncomplicated: Secondary | ICD-10-CM | POA: Diagnosis not present

## 2023-09-11 DIAGNOSIS — Z8673 Personal history of transient ischemic attack (TIA), and cerebral infarction without residual deficits: Secondary | ICD-10-CM | POA: Diagnosis not present

## 2023-09-11 HISTORY — DX: Type 2 diabetes mellitus without complications: E11.9

## 2023-09-11 HISTORY — DX: Personal history of pneumonia (recurrent): Z87.01

## 2023-09-11 HISTORY — DX: Transient cerebral ischemic attack, unspecified: G45.9

## 2023-09-11 LAB — BASIC METABOLIC PANEL
Anion gap: 10 (ref 5–15)
BUN: 11 mg/dL (ref 8–23)
CO2: 26 mmol/L (ref 22–32)
Calcium: 9.4 mg/dL (ref 8.9–10.3)
Chloride: 101 mmol/L (ref 98–111)
Creatinine, Ser: 0.68 mg/dL (ref 0.44–1.00)
GFR, Estimated: >60 mL/min (ref >60)
Glucose, Bld: 114 mg/dL — ABNORMAL HIGH (ref 70–99)
Potassium: 3.6 mmol/L (ref 3.5–5.1)
Sodium: 137 mmol/L (ref 135–145)

## 2023-09-11 LAB — CBC
HCT: 40.7 % (ref 36.0–46.0)
Hemoglobin: 13.4 g/dL (ref 12.0–15.0)
MCH: 30.6 pg (ref 26.0–34.0)
MCHC: 32.9 g/dL (ref 30.0–36.0)
MCV: 92.9 fL (ref 80.0–100.0)
Platelets: 387 10*3/uL (ref 150–400)
RBC: 4.38 MIL/uL (ref 3.87–5.11)
RDW: 14.9 % (ref 11.5–15.5)
WBC: 9.7 10*3/uL (ref 4.0–10.5)
nRBC: 0 % (ref 0.0–0.2)

## 2023-09-11 LAB — SURGICAL PCR SCREEN
MRSA, PCR: NEGATIVE
Staphylococcus aureus: POSITIVE — AB

## 2023-09-11 LAB — GLUCOSE, CAPILLARY: Glucose-Capillary: 99 mg/dL (ref 70–99)

## 2023-09-11 NOTE — Progress Notes (Signed)
Per Dr. Aundria Rud, patient can hold Plavix for 3 days and hold 81mg  Aspirin on the day of surgery.  Followed up with Sheilah Pigeon, PA and confirmed that patient can hold Plavix for 3 days prior to surgery - Renee stated that this was was fine.    Per Dr. Hart Rochester, if patient holds Plavix for 3 days, patient can still receive an interscalene block on the day of surgery (Dr. Aundria Rud' request).    Last dose of Plavix - 09/14/23 Last dose of  Aspirin - 09/17/23  Patient has been made aware of the instructions and verbalizes understanding.

## 2023-09-11 NOTE — Progress Notes (Signed)
PCP - [facility] Cardiologist - Dr. Peter Swaziland  PPM/ICD - denies Device Orders - n/a Rep Notified - n/a  Chest x-ray - 01/02/23 EKG - 01/06/23 Stress Test - 06/06/21 ECHO - 09/06/22 Cardiac Cath - 04/05/2009  Sleep Study - denies CPAP - n/a  Fasting Blood Sugar - unknown - patient does not check blood sugar at home   Last dose of GLP1 agonist-  n/a GLP1 instructions: n/a  Blood Thinner Instructions: Patient has not received instructions about when to stop Plavix or Aspirin from neither Dr. Aundria Rud nor her cardiologist.  Message has been left with Dr. Aundria Rud office and Dr. Aundria Rud was paged.  Notified patient that this nurse will call her with instructions as soon as Dr. Aundria Rud responds.  Patient is also aware to call Dr. Aundria Rud' office on Monday morning if no instructions are received today.     ERAS Protcol - clears until 1000  G2- complete by 1000  COVID TEST- n/a   Anesthesia review: yes - CAD, HTN, MI, COPD, TIA   Patient denies shortness of breath, fever, cough and chest pain at PAT appointment   All instructions explained to the patient, with a verbal understanding of the material. Patient agrees to go over the instructions while at home for a better understanding. Patient also instructed to self quarantine after being tested for COVID-19. The opportunity to ask questions was provided.  Patient has allergy to chlorhexidine - patient instructed to wash with antibacterial soap.

## 2023-09-11 NOTE — Telephone Encounter (Signed)
Caller [name]) stated patient wants a call back directly to discuss pre-procedure instructions regarding her Plavix and Aspirin

## 2023-09-11 NOTE — Telephone Encounter (Signed)
I s/w [name] with [facility].  [name] did tell me that she s/w Francis Dowse, [facility] today and was able to confirm the instructions for the Plavix and ASA. [name] thanked me though for still giving her a call.   Pre-op shoulder surgery Her CAD felt stable for many years Ischemic eval in 2022 was good/low risk with preserved LVEF Prior to her last fall, she reports being active inside and outside, gardening without intolerances or symptoms (outside of her chronic back pain), independent with all ADLs I do not think she needs new cardiac work up pre-operatively She has a 6.6% calculated cardiac clinic risk for surgery planned This was discussed with her/her daughter   Addend: Surgical team requests she be off Plavix 3 days and hold ASA day of surgery, that would be OK with [place], resume Plavix/ASA as soon as safe afterwards Francis Dowse, PA-C 09/11/23

## 2023-09-12 LAB — HEMOGLOBIN A1C
Hgb A1c MFr Bld: 6.3 % — ABNORMAL HIGH (ref 4.8–5.6)
Mean Plasma Glucose: 134 mg/dL

## 2023-09-14 NOTE — Anesthesia Preprocedure Evaluation (Signed)
Anesthesia Evaluation    Airway        Dental   Pulmonary Current Smoker          Cardiovascular hypertension,      Neuro/Psych    GI/Hepatic   Endo/Other  diabetes    Renal/GU      Musculoskeletal   Abdominal   Peds  Hematology   Anesthesia Other Findings   Reproductive/Obstetrics                              Anesthesia Physical Anesthesia Plan  ASA:   Anesthesia Plan:    Post-op Pain Management:    Induction:   PONV Risk Score and Plan:   Airway Management Planned:   Additional Equipment:   Intra-op Plan:   Post-operative Plan:   Informed Consent:   Plan Discussed with:   Anesthesia Plan Comments: (PAT note by Antionette Poles, PA-C:  77 yo female follows with cardiology for hx of CAD (PCI to OM1 in 1990s and PCI to RCA in 2000), HTN, HLD, AFib, stroke. Her last ischemic evaluation was a nuclear stress test in 2022 which was low risk.  Echo 08/2022 showing EF 55-60%, grade 1dd. She is noted to have an isolated episode of paroxysmal atrial fibrillation in 2015 in the setting of sepsis. Anticoagulation was not felt to be needed going forward. Seen by Francis Dowse, PA-C 09/10/23 for preop eval. Per note, "Her CAD felt stable for many years. Ischemic eval in 2022 was good/low risk with preserved LVEF. Prior to her last fall, she reports being active inside and outside, gardening without intolerances or symptoms (outside of her chronic back pain), independent with all ADLs. I do not think she needs new cardiac work up pre-operatively. She has a 6.6% calculated cardiac clinic risk for surgery planned. This was discussed with her/her daughter. 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  Current smoker with associated COPD, NIDDM2 (A1c 6.3 on 09/11/23), severe back pain/issues, spinal surgeries for management of cervical discitis and spinal stenosis   Preop labs reviewed, unremarkable.   EKG 09/10/23 (read per Francis Dowse, PA-C note same date): SR 86bpm, artifact, IVCD, nonspecific ST/T changes look similar to priors. Hx of icRBBB, suspect different lead position today. No new/ischemic looking changes  TTE 09/06/22:  1. Left ventricular ejection fraction, by estimation, is 55 to 60%. The  left ventricle has normal function. The left ventricle has no regional  wall motion abnormalities. There is mild left ventricular hypertrophy.  Left ventricular diastolic parameters  are consistent with Grade I diastolic dysfunction (impaired relaxation).   2. Right ventricular systolic function is normal. The right ventricular  size is normal.   3. Left atrial size was mildly dilated.   4. The mitral valve is abnormal. Trivial mitral valve regurgitation. No  evidence of mitral stenosis. Moderate mitral annular calcification.   5. The aortic valve is tricuspid. There is mild calcification of the  aortic valve. Aortic valve regurgitation is not visualized. Aortic valve  sclerosis is present, with no evidence of aortic valve stenosis.   6. The inferior vena cava is normal in size  with greater than 50%  respiratory variability, suggesting right atrial pressure of 3 mmHg.    Myoview 06/06/21:    The study is normal. The study is low risk.   No ST deviation was noted.   Left ventricular function is normal. Nuclear stress EF: 58 %. The left ventricular ejection fraction is normal (55-65%). End diastolic cavity size is normal.   Prior study available for comparison from 07/27/2014.   Low risk stress nuclear study with normal perfusion and  normal left ventricular regional and global systolic function.    )         Anesthesia Quick Evaluation

## 2023-09-14 NOTE — Progress Notes (Signed)
Anesthesia Chart Review:  77 yo female follows with cardiology for hx of CAD (PCI to OM1 in 1990s and PCI to RCA in 2000), HTN, HLD, AFib, stroke. Her last ischemic evaluation was a nuclear stress test in 2022 which was low risk.  Echo 08/2022 showing EF 55-60%, grade 1dd. She is noted to have an isolated episode of paroxysmal atrial fibrillation in 2015 in the setting of sepsis. Anticoagulation was not felt to be needed going forward. Seen by Francis Dowse, PA-C 09/10/23 for preop eval. Per note, "Her CAD felt stable for many years. Ischemic eval in 2022 was good/low risk with preserved LVEF. Prior to her last fall, she reports being active inside and outside, gardening without intolerances or symptoms (outside of her chronic back pain), independent with all ADLs. I do not think she needs new cardiac work up pre-operatively. She has a 6.6% calculated cardiac clinic risk for surgery planned. This was discussed with her/her daughter. Addend: Surgical team requests she be off Plavix 3 days and hold ASA day of surgery, that would be OK with [place], resume Plavix/ASA as soon as safe afterwards."  Other pertinent PMHx includes diverticulum of the esophagus c/b fistula/perforation > gastrostomy tube placed. 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  Current smoker with associated COPD, NIDDM2 (A1c 6.3 on 09/11/23), severe back pain/issues, spinal surgeries for management of cervical discitis and spinal stenosis   Preop labs reviewed, unremarkable.   EKG 09/10/23 (read per Francis Dowse, PA-C note same date): SR 86bpm, artifact, IVCD, nonspecific ST/T changes look similar to priors. Hx of icRBBB, suspect different lead position today. No new/ischemic looking changes  TTE 09/06/22:  1. Left ventricular ejection fraction, by estimation, is 55 to 60%. The  left ventricle has normal function. The left ventricle has no regional  wall  motion abnormalities. There is mild left ventricular hypertrophy.  Left ventricular diastolic parameters  are consistent with Grade I diastolic dysfunction (impaired relaxation).   2. Right ventricular systolic function is normal. The right ventricular  size is normal.   3. Left atrial size was mildly dilated.   4. The mitral valve is abnormal. Trivial mitral valve regurgitation. No  evidence of mitral stenosis. Moderate mitral annular calcification.   5. The aortic valve is tricuspid. There is mild calcification of the  aortic valve. Aortic valve regurgitation is not visualized. Aortic valve  sclerosis is present, with no evidence of aortic valve stenosis.   6. The inferior vena cava is normal in size with greater than 50%  respiratory variability, suggesting right atrial pressure of 3 mmHg.    Myoview 06/06/21:    The study is normal. The study is low risk.   No ST deviation was noted.   Left ventricular function is normal. Nuclear stress EF: 58 %. The left ventricular ejection fraction is normal (55-65%). End diastolic cavity size is normal.   Prior study available for comparison from 07/27/2014.   Low risk stress nuclear study with normal perfusion and normal left ventricular regional and global systolic function.      Zannie Cove [facility]/Anesthesiology Phone [phone] 09/14/2023 10:05 AM

## 2023-09-18 ENCOUNTER — Ambulatory Visit (HOSPITAL_COMMUNITY): Payer: Medicare Other | Admitting: Anesthesiology

## 2023-09-18 ENCOUNTER — Encounter (HOSPITAL_COMMUNITY)
Admission: RE | Disposition: A | Discharge status: Home / Self Care | Payer: Self-pay | Source: Home / Self Care | Attending: Orthopedic Surgery

## 2023-09-18 ENCOUNTER — Other Ambulatory Visit: Payer: Self-pay

## 2023-09-18 ENCOUNTER — Ambulatory Visit (HOSPITAL_COMMUNITY): Payer: Medicare Other | Admitting: Physician Assistant

## 2023-09-18 ENCOUNTER — Ambulatory Visit (HOSPITAL_COMMUNITY): Payer: Medicare Other

## 2023-09-18 ENCOUNTER — Observation Stay (HOSPITAL_COMMUNITY)
Admission: RE | Admit: 2023-09-18 | Discharge: 2023-09-20 | Disposition: A | Discharge status: Home / Self Care | Payer: Medicare Other | Attending: Orthopedic Surgery | Admitting: Orthopedic Surgery

## 2023-09-18 ENCOUNTER — Encounter (HOSPITAL_COMMUNITY): Payer: Self-pay | Admitting: Orthopedic Surgery

## 2023-09-18 DIAGNOSIS — Z955 Presence of coronary angioplasty implant and graft: Secondary | ICD-10-CM | POA: Insufficient documentation

## 2023-09-18 DIAGNOSIS — Z01818 Encounter for other preprocedural examination: Secondary | ICD-10-CM

## 2023-09-18 DIAGNOSIS — S42202A Unspecified fracture of upper end of left humerus, initial encounter for closed fracture: Secondary | ICD-10-CM | POA: Diagnosis not present

## 2023-09-18 DIAGNOSIS — Z7902 Long term (current) use of antithrombotics/antiplatelets: Secondary | ICD-10-CM | POA: Diagnosis not present

## 2023-09-18 DIAGNOSIS — J449 Chronic obstructive pulmonary disease, unspecified: Secondary | ICD-10-CM | POA: Insufficient documentation

## 2023-09-18 DIAGNOSIS — I10 Essential (primary) hypertension: Secondary | ICD-10-CM | POA: Insufficient documentation

## 2023-09-18 DIAGNOSIS — Z96643 Presence of artificial hip joint, bilateral: Secondary | ICD-10-CM | POA: Insufficient documentation

## 2023-09-18 DIAGNOSIS — Z8673 Personal history of transient ischemic attack (TIA), and cerebral infarction without residual deficits: Secondary | ICD-10-CM | POA: Insufficient documentation

## 2023-09-18 DIAGNOSIS — Z7982 Long term (current) use of aspirin: Secondary | ICD-10-CM | POA: Diagnosis not present

## 2023-09-18 DIAGNOSIS — I2510 Atherosclerotic heart disease of native coronary artery without angina pectoris: Secondary | ICD-10-CM

## 2023-09-18 DIAGNOSIS — I4891 Unspecified atrial fibrillation: Secondary | ICD-10-CM | POA: Insufficient documentation

## 2023-09-18 DIAGNOSIS — Z79899 Other long term (current) drug therapy: Secondary | ICD-10-CM | POA: Insufficient documentation

## 2023-09-18 DIAGNOSIS — Z96612 Presence of left artificial shoulder joint: Secondary | ICD-10-CM | POA: Diagnosis not present

## 2023-09-18 DIAGNOSIS — F17210 Nicotine dependence, cigarettes, uncomplicated: Secondary | ICD-10-CM | POA: Diagnosis not present

## 2023-09-18 DIAGNOSIS — E119 Type 2 diabetes mellitus without complications: Secondary | ICD-10-CM | POA: Diagnosis not present

## 2023-09-18 DIAGNOSIS — Z853 Personal history of malignant neoplasm of breast: Secondary | ICD-10-CM | POA: Diagnosis not present

## 2023-09-18 DIAGNOSIS — Z471 Aftercare following joint replacement surgery: Secondary | ICD-10-CM | POA: Diagnosis not present

## 2023-09-18 DIAGNOSIS — F17200 Nicotine dependence, unspecified, uncomplicated: Secondary | ICD-10-CM | POA: Diagnosis not present

## 2023-09-18 DIAGNOSIS — W010XXA Fall on same level from slipping, tripping and stumbling without subsequent striking against object, initial encounter: Secondary | ICD-10-CM | POA: Diagnosis not present

## 2023-09-18 DIAGNOSIS — G8918 Other acute postprocedural pain: Secondary | ICD-10-CM | POA: Diagnosis not present

## 2023-09-18 DIAGNOSIS — S42212A Unspecified displaced fracture of surgical neck of left humerus, initial encounter for closed fracture: Secondary | ICD-10-CM | POA: Diagnosis not present

## 2023-09-18 HISTORY — PX: REVERSE SHOULDER ARTHROPLASTY: SHX5054

## 2023-09-18 LAB — GLUCOSE, CAPILLARY
Glucose-Capillary: 118 mg/dL — ABNORMAL HIGH (ref 70–99)
Glucose-Capillary: 122 mg/dL — ABNORMAL HIGH (ref 70–99)
Glucose-Capillary: 106 mg/dL — ABNORMAL HIGH (ref 70–99)
Glucose-Capillary: 59 mg/dL — ABNORMAL LOW (ref 70–99)
Glucose-Capillary: 73 mg/dL (ref 70–99)

## 2023-09-18 SURGERY — ARTHROPLASTY, SHOULDER, TOTAL, REVERSE
Anesthesia: General | Site: Shoulder | Laterality: Left

## 2023-09-18 MED ORDER — DILTIAZEM HCL ER COATED BEADS 120 MG PO CP24: 120.0000 mg | ORAL_CAPSULE | Freq: Every day | ORAL | Status: DC

## 2023-09-18 MED ORDER — INSULIN ASPART 100 UNIT/ML IJ SOLN: 0.0000 [IU] | Freq: Three times a day (TID) | INTRAMUSCULAR | Status: DC

## 2023-09-18 MED ORDER — LACTATED RINGERS IV SOLN: INTRAVENOUS | Status: DC

## 2023-09-18 MED ORDER — ORAL CARE MOUTH RINSE
15.0000 mL | Freq: Once | OROMUCOSAL | Status: AC
  Administered 2023-09-18: 15 mL via OROMUCOSAL

## 2023-09-18 MED ORDER — PHENYLEPHRINE HCL-NACL 20-0.9 MG/250ML-% IV SOLN
INTRAVENOUS | Status: DC | PRN
  Administered 2023-09-18: 50 ug/min via INTRAVENOUS
  Administered 2023-09-18: 75 ug/min via INTRAVENOUS

## 2023-09-18 MED ORDER — FENTANYL CITRATE (PF) 250 MCG/5ML IJ SOLN
INTRAMUSCULAR | Status: AC
  Filled 2023-09-18: qty 5

## 2023-09-18 MED ORDER — FENTANYL CITRATE (PF) 250 MCG/5ML IJ SOLN
INTRAMUSCULAR | Status: DC | PRN
  Administered 2023-09-18: 100 ug via INTRAVENOUS

## 2023-09-18 MED ORDER — BUPIVACAINE HCL (PF) 0.5 % IJ SOLN
INTRAMUSCULAR | Status: DC | PRN
  Administered 2023-09-18: 15 mL via PERINEURAL

## 2023-09-18 MED ORDER — PROPOFOL 10 MG/ML IV BOLUS
INTRAVENOUS | Status: DC | PRN
  Administered 2023-09-18: 100 mg via INTRAVENOUS

## 2023-09-18 MED ORDER — ONDANSETRON HCL 4 MG/2ML IJ SOLN: 4.0000 mg | Freq: Four times a day (QID) | INTRAMUSCULAR | Status: DC | PRN

## 2023-09-18 MED ORDER — INSULIN ASPART 100 UNIT/ML IJ SOLN
0.0000 [IU] | Freq: Every day | INTRAMUSCULAR | Status: DC
Start: 2023-09-18 — End: 2023-09-18

## 2023-09-18 MED ORDER — GABAPENTIN 300 MG PO CAPS: 600.0000 mg | ORAL_CAPSULE | Freq: Two times a day (BID) | ORAL | Status: DC | PRN

## 2023-09-18 MED ORDER — TRANEXAMIC ACID-NACL 1000-0.7 MG/100ML-% IV SOLN
1000.0000 mg | INTRAVENOUS | Status: AC
  Administered 2023-09-18: 1000 mg via INTRAVENOUS
  Filled 2023-09-18: qty 100

## 2023-09-18 MED ORDER — IPRATROPIUM-ALBUTEROL 0.5-2.5 (3) MG/3ML IN SOLN: 3.0000 mL | RESPIRATORY_TRACT | Status: DC | PRN

## 2023-09-18 MED ORDER — VANCOMYCIN HCL IN DEXTROSE 1-5 GM/200ML-% IV SOLN
1000.0000 mg | Freq: Two times a day (BID) | INTRAVENOUS | Status: AC
  Administered 2023-09-18: 1000 mg via INTRAVENOUS
  Filled 2023-09-18: qty 200

## 2023-09-18 MED ORDER — ALBUTEROL SULFATE HFA 108 (90 BASE) MCG/ACT IN AERS: 2.0000 | INHALATION_SPRAY | Freq: Four times a day (QID) | RESPIRATORY_TRACT | Status: DC | PRN

## 2023-09-18 MED ORDER — VANCOMYCIN HCL IN DEXTROSE 1-5 GM/200ML-% IV SOLN
1000.0000 mg | INTRAVENOUS | Status: AC
  Administered 2023-09-18: 1000 mg via INTRAVENOUS
  Filled 2023-09-18: qty 200

## 2023-09-18 MED ORDER — METFORMIN HCL ER 500 MG PO TB24: 500.0000 mg | ORAL_TABLET | Freq: Every day | ORAL | Status: DC

## 2023-09-18 MED ORDER — ONDANSETRON HCL 4 MG/2ML IJ SOLN: 4.0000 mg | Freq: Once | INTRAMUSCULAR | Status: DC | PRN

## 2023-09-18 MED ORDER — MUPIROCIN 2 % EX OINT: 1.0000 | TOPICAL_OINTMENT | Freq: Two times a day (BID) | CUTANEOUS | 0 refills | Status: AC

## 2023-09-18 MED ORDER — ONDANSETRON HCL 4 MG/2ML IJ SOLN
INTRAMUSCULAR | Status: DC | PRN
  Administered 2023-09-18: 4 mg via INTRAVENOUS

## 2023-09-18 MED ORDER — CHLORHEXIDINE GLUCONATE 0.12 % MT SOLN: 15.0000 mL | Freq: Once | OROMUCOSAL | Status: AC

## 2023-09-18 MED ORDER — OXYCODONE HCL 5 MG PO TABS: 5.0000 mg | ORAL_TABLET | ORAL | 0 refills | Status: DC | PRN

## 2023-09-18 MED ORDER — INSULIN ASPART 100 UNIT/ML IJ SOLN: 0.0000 [IU] | Freq: Every day | INTRAMUSCULAR | Status: DC

## 2023-09-18 MED ORDER — 0.9 % SODIUM CHLORIDE (POUR BTL) OPTIME
TOPICAL | Status: DC | PRN
  Administered 2023-09-18: 1000 mL

## 2023-09-18 MED ORDER — CLOPIDOGREL BISULFATE 75 MG PO TABS
75.0000 mg | ORAL_TABLET | Freq: Every day | ORAL | Status: DC
  Administered 2023-09-19 – 2023-09-20 (×2): 75 mg via ORAL
  Filled 2023-09-18 (×3): qty 1

## 2023-09-18 MED ORDER — LOSARTAN POTASSIUM 50 MG PO TABS: 50.0000 mg | ORAL_TABLET | Freq: Every day | ORAL | Status: DC

## 2023-09-18 MED ORDER — AMISULPRIDE (ANTIEMETIC) 5 MG/2ML IV SOLN: 10.0000 mg | Freq: Once | INTRAVENOUS | Status: DC | PRN

## 2023-09-18 MED ORDER — OXYCODONE HCL 5 MG PO TABS: 10.0000 mg | ORAL_TABLET | ORAL | Status: DC | PRN

## 2023-09-18 MED ORDER — LEVOFLOXACIN IN D5W 500 MG/100ML IV SOLN
500.0000 mg | INTRAVENOUS | Status: AC
Start: 2023-09-18 — End: 2023-09-18
  Administered 2023-09-18: 500 mg via INTRAVENOUS
  Filled 2023-09-18: qty 100

## 2023-09-18 MED ORDER — ONDANSETRON HCL 4 MG PO TABS: 4.0000 mg | ORAL_TABLET | Freq: Four times a day (QID) | ORAL | Status: DC | PRN

## 2023-09-18 MED ORDER — FENTANYL CITRATE (PF) 100 MCG/2ML IJ SOLN
INTRAMUSCULAR | Status: AC
  Administered 2023-09-18: 50 ug via INTRAVENOUS
  Filled 2023-09-18: qty 2

## 2023-09-18 MED ORDER — VANCOMYCIN HCL 1000 MG IV SOLR
INTRAVENOUS | Status: AC
  Filled 2023-09-18: qty 20

## 2023-09-18 MED ORDER — ACETAMINOPHEN 500 MG PO TABS
1000.0000 mg | ORAL_TABLET | Freq: Four times a day (QID) | ORAL | Status: AC
  Administered 2023-09-19: 1000 mg via ORAL
  Filled 2023-09-18 (×3): qty 2

## 2023-09-18 MED ORDER — FENTANYL CITRATE (PF) 100 MCG/2ML IJ SOLN: 50.0000 ug | Freq: Once | INTRAMUSCULAR | Status: AC

## 2023-09-18 MED ORDER — MIDAZOLAM HCL 2 MG/2ML IJ SOLN
INTRAMUSCULAR | Status: AC
  Filled 2023-09-18: qty 2

## 2023-09-18 MED ORDER — BUPIVACAINE LIPOSOME 1.3 % IJ SUSP
INTRAMUSCULAR | Status: DC | PRN
  Administered 2023-09-18: 10 mL via PERINEURAL

## 2023-09-18 MED ORDER — ACETAMINOPHEN 325 MG PO TABS: 325.0000 mg | ORAL_TABLET | Freq: Four times a day (QID) | ORAL | Status: DC | PRN

## 2023-09-18 MED ORDER — ASPIRIN 81 MG PO TBEC
81.0000 mg | DELAYED_RELEASE_TABLET | Freq: Every day | ORAL | Status: DC
  Administered 2023-09-19 – 2023-09-20 (×2): 81 mg via ORAL
  Filled 2023-09-18 (×3): qty 1

## 2023-09-18 MED ORDER — ROCURONIUM BROMIDE 10 MG/ML (PF) SYRINGE
PREFILLED_SYRINGE | INTRAVENOUS | Status: DC | PRN
  Administered 2023-09-18: 80 mg via INTRAVENOUS

## 2023-09-18 MED ORDER — ALBUTEROL SULFATE (2.5 MG/3ML) 0.083% IN NEBU: 3.0000 mL | INHALATION_SOLUTION | Freq: Four times a day (QID) | RESPIRATORY_TRACT | Status: DC | PRN

## 2023-09-18 MED ORDER — DOCUSATE SODIUM 100 MG PO CAPS
100.0000 mg | ORAL_CAPSULE | Freq: Two times a day (BID) | ORAL | Status: DC
  Administered 2023-09-19 – 2023-09-20 (×2): 100 mg via ORAL
  Filled 2023-09-18 (×3): qty 1

## 2023-09-18 MED ORDER — AMLODIPINE BESYLATE 10 MG PO TABS: 10.0000 mg | ORAL_TABLET | Freq: Every day | ORAL | Status: DC

## 2023-09-18 MED ORDER — METOCLOPRAMIDE HCL 5 MG PO TABS: 5.0000 mg | ORAL_TABLET | Freq: Three times a day (TID) | ORAL | Status: DC | PRN

## 2023-09-18 MED ORDER — PHENOL 1.4 % MT LIQD: 1.0000 | OROMUCOSAL | Status: DC | PRN

## 2023-09-18 MED ORDER — HYDROMORPHONE HCL 1 MG/ML IJ SOLN: 0.5000 mg | INTRAMUSCULAR | Status: DC | PRN

## 2023-09-18 MED ORDER — METHOCARBAMOL 500 MG PO TABS
500.0000 mg | ORAL_TABLET | Freq: Every day | ORAL | Status: DC | PRN
  Administered 2023-09-20: 500 mg via ORAL
  Filled 2023-09-18: qty 1

## 2023-09-18 MED ORDER — LIDOCAINE 2% (20 MG/ML) 5 ML SYRINGE
INTRAMUSCULAR | Status: DC | PRN
  Administered 2023-09-18: 60 mg via INTRAVENOUS

## 2023-09-18 MED ORDER — SUGAMMADEX SODIUM 200 MG/2ML IV SOLN
INTRAVENOUS | Status: DC | PRN
  Administered 2023-09-18: 200 mg via INTRAVENOUS

## 2023-09-18 MED ORDER — VANCOMYCIN HCL 1 G IV SOLR
INTRAVENOUS | Status: DC | PRN
  Administered 2023-09-18: 1000 mg

## 2023-09-18 MED ORDER — ROSUVASTATIN CALCIUM 5 MG PO TABS
10.0000 mg | ORAL_TABLET | Freq: Every day | ORAL | Status: DC
  Administered 2023-09-19 – 2023-09-20 (×2): 10 mg via ORAL
  Filled 2023-09-18 (×2): qty 2

## 2023-09-18 MED ORDER — SODIUM CHLORIDE 0.9 % IV SOLN: INTRAVENOUS | Status: DC

## 2023-09-18 MED ORDER — OXYCODONE HCL 5 MG PO TABS
5.0000 mg | ORAL_TABLET | ORAL | Status: DC | PRN
  Administered 2023-09-19: 10 mg via ORAL
  Administered 2023-09-19: 5 mg via ORAL
  Administered 2023-09-20: 10 mg via ORAL
  Filled 2023-09-18: qty 2
  Filled 2023-09-18: qty 1
  Filled 2023-09-18: qty 2

## 2023-09-18 MED ORDER — METOCLOPRAMIDE HCL 5 MG/ML IJ SOLN: 5.0000 mg | Freq: Three times a day (TID) | INTRAMUSCULAR | Status: DC | PRN

## 2023-09-18 MED ORDER — HYDROMORPHONE HCL 1 MG/ML IJ SOLN
0.2500 mg | INTRAMUSCULAR | Status: DC | PRN
Start: 2023-09-18 — End: 2023-09-18

## 2023-09-18 MED ORDER — MENTHOL 3 MG MT LOZG: 1.0000 | LOZENGE | OROMUCOSAL | Status: DC | PRN

## 2023-09-18 SURGICAL SUPPLY — 70 items
BAG COUNTER SPONGE SURGICOUNT (BAG) ×2 IMPLANT
BASEPLATE GLENOSPHERE 25 (Plate) IMPLANT
BIT DRILL 5/64X5 DISP (BIT) ×2 IMPLANT
BIT DRILL TWIST 2.7 (BIT) IMPLANT
BLADE SAG 18X100X1.27 (BLADE) ×2 IMPLANT
BNDG COHESIVE 4X5 TAN STRL LF (GAUZE/BANDAGES/DRESSINGS) IMPLANT
COVER SURGICAL LIGHT HANDLE (MISCELLANEOUS) ×2 IMPLANT
DRAPE IMP U-DRAPE 54X76 (DRAPES) ×4 IMPLANT
DRAPE INCISE IOBAN 66X45 STRL (DRAPES) ×2 IMPLANT
DRAPE SURG 17X23 STRL (DRAPES) ×2 IMPLANT
DRAPE SURG ORHT 6 SPLT 77X108 (DRAPES) ×4 IMPLANT
DRAPE U-SHAPE 47X51 STRL (DRAPES) ×2 IMPLANT
DRESSING AQUACEL AG SP 3.5X6 (GAUZE/BANDAGES/DRESSINGS) ×2 IMPLANT
DRSG AQUACEL AG ADV 3.5X 6 (GAUZE/BANDAGES/DRESSINGS) IMPLANT
DRSG AQUACEL AG ADV 3.5X10 (GAUZE/BANDAGES/DRESSINGS) ×2 IMPLANT
DRSG AQUACEL AG SP 3.5X6 (GAUZE/BANDAGES/DRESSINGS)
DURAPREP 26ML APPLICATOR (WOUND CARE) ×2 IMPLANT
ELECT BLADE 4.0 EZ CLEAN MEGAD (MISCELLANEOUS) ×1
ELECT REM PT RETURN 9FT ADLT (ELECTROSURGICAL) ×1
ELECTRODE BLDE 4.0 EZ CLN MEGD (MISCELLANEOUS) ×2 IMPLANT
ELECTRODE REM PT RTRN 9FT ADLT (ELECTROSURGICAL) ×2 IMPLANT
FACESHIELD WRAPAROUND (MASK)
FACESHIELD WRAPAROUND OR TEAM (MASK) ×2 IMPLANT
GLENOID SPHERE STD STRL 36MM (Orthopedic Implant) IMPLANT
GLOVE BIO SURGEON STRL SZ7.5 (GLOVE) ×4 IMPLANT
GLOVE BIOGEL PI IND STRL 8 (GLOVE) ×4 IMPLANT
GOWN STRL REUS W/ TWL LRG LVL3 (GOWN DISPOSABLE) ×2 IMPLANT
GOWN STRL REUS W/ TWL XL LVL3 (GOWN DISPOSABLE) ×4 IMPLANT
KIT BASIN OR (CUSTOM PROCEDURE TRAY) ×2 IMPLANT
KIT TURNOVER KIT B (KITS) ×2 IMPLANT
MANIFOLD NEPTUNE II (INSTRUMENTS) ×2 IMPLANT
NDL 1/2 CIR MAYO (NEEDLE) ×2 IMPLANT
NDL HYPO 25GX1X1/2 BEV (NEEDLE) ×2 IMPLANT
NEEDLE 1/2 CIR MAYO (NEEDLE) ×1
NEEDLE HYPO 25GX1X1/2 BEV (NEEDLE) ×1
NS IRRIG 1000ML POUR BTL (IV SOLUTION) ×2 IMPLANT
PACK SHOULDER (CUSTOM PROCEDURE TRAY) ×2 IMPLANT
PAD ARMBOARD 7.5X6 YLW CONV (MISCELLANEOUS) ×4 IMPLANT
PIN THREADED REVERSE (PIN) IMPLANT
RESTRAINT HEAD UNIVERSAL NS (MISCELLANEOUS) ×2 IMPLANT
SCREW CENTRAL 6.5X20MM (Screw) IMPLANT
SCREW LOCKING 4.75MMX15MM (Screw) IMPLANT
SCREW LOCKING NS 4.75MMX20MM (Screw) IMPLANT
SCREW LOCKING STRL 4.75X25X3.5 (Screw) IMPLANT
SLING ARM FOAM STRAP LRG (SOFTGOODS) IMPLANT
SLING ARM IMMOBILIZER LRG (SOFTGOODS) IMPLANT
SLING ARM IMMOBILIZER MED (SOFTGOODS) IMPLANT
SPONGE T-LAP 18X18 ~~LOC~~+RFID (SPONGE) IMPLANT
SPONGE T-LAP 4X18 ~~LOC~~+RFID (SPONGE) ×2 IMPLANT
STEM HUMERAL STRL 11MMX83MM (Stem) IMPLANT
STOCKINETTE IMPERVIOUS 9X36 MD (GAUZE/BANDAGES/DRESSINGS) IMPLANT
STRIP CLOSURE SKIN 1/2X4 (GAUZE/BANDAGES/DRESSINGS) ×2 IMPLANT
SUCTION TUBE FRAZIER 10FR DISP (SUCTIONS) ×2 IMPLANT
SUT BROADBAND TAPE 2PK 1.5 (SUTURE) IMPLANT
SUT ETHILON 3 0 PS 1 (SUTURE) IMPLANT
SUT FIBERWIRE #2 38 T-5 BLUE (SUTURE)
SUT MAXBRAID #2 CVD NDL (SUTURE) IMPLANT
SUT MNCRL AB 3-0 PS2 18 (SUTURE) ×2 IMPLANT
SUT MON AB 2-0 SH27 (SUTURE) IMPLANT
SUT VIC AB 1 CT1 27XBRD ANBCTR (SUTURE) IMPLANT
SUT VIC AB 2-0 CT1 TAPERPNT 27 (SUTURE) ×2 IMPLANT
SUTURE FIBERWR #2 38 T-5 BLUE (SUTURE) ×2 IMPLANT
SYR CONTROL 10ML LL (SYRINGE) ×2 IMPLANT
TOWEL GREEN STERILE (TOWEL DISPOSABLE) ×2 IMPLANT
TOWEL GREEN STERILE FF (TOWEL DISPOSABLE) ×2 IMPLANT
TOWER CARTRIDGE SMART MIX (DISPOSABLE) IMPLANT
TRAY HUM REV SHOULDER 36 +3 (Shoulder) IMPLANT
TRAY HUM REV SHOULDER STD +6 (Shoulder) IMPLANT
WATER STERILE IRR 1000ML POUR (IV SOLUTION) ×2 IMPLANT
YANKAUER SUCT BULB TIP NO VENT (SUCTIONS) ×2 IMPLANT

## 2023-09-18 NOTE — Anesthesia Procedure Notes (Signed)
 Anesthesia Regional Block: Interscalene brachial plexus block   Pre-Anesthetic Checklist: , timeout performed,  Correct Patient, Correct Site, Correct Laterality,  Correct Procedure, Correct Position, site marked,  Risks and benefits discussed,  Surgical consent,  Pre-op evaluation,  At surgeon's request and post-op pain management  Laterality: Left  Prep: Dura Prep       Needles:  Injection technique: Single-shot  Needle Type: Echogenic Stimulator Needle     Needle Length: 10cm  Needle Gauge: 21   Needle insertion depth: 3 cm   Additional Needles:   Procedures:,,,, ultrasound used (permanent image in chart),,   Motor weakness within 5 minutes.  Narrative:  Start time: 09/18/2023 12:30 PM End time: 09/18/2023 12:35 PM Injection made incrementally with aspirations every 5 mL.  Performed by: Personally  Anesthesiologist: Jerrye Sharper, MD  Additional Notes: Timeout performed. Patient sedated. Relevant anatomy ID'd using US . Incremental 2-5ml injection of LA with frequent aspiration. Patient tolerated procedure well.

## 2023-09-18 NOTE — Brief Op Note (Signed)
 09/18/2023  3:03 PM  PATIENT:  [name]  78 y.o. female  PRE-OPERATIVE DIAGNOSIS:  Left proximal humerus fracture  POST-OPERATIVE DIAGNOSIS:  Left proximal humerus fracture  PROCEDURE:  Procedure(s): LEFT REVERSE TOTAL SHOULDER ARTHROPLASTY (Left)  SURGEON:  Surgeons and Role:    * Sharl Selinda Dover, MD - Primary  ASSISTANTS: Magdalene Fireman, RNFA   ANESTHESIA:   regional and general  EBL:  100 cc  BLOOD ADMINISTERED:none  DRAINS: none   LOCAL MEDICATIONS USED:  NONE  SPECIMEN:  No Specimen  DISPOSITION OF SPECIMEN:  N/A  COUNTS:  YES  TOURNIQUET:  * No tourniquets in log *  DICTATION: .Note written in EPIC  PLAN OF CARE: Admit to inpatient   PATIENT DISPOSITION:  PACU - hemodynamically stable.   Delay start of Pharmacological VTE agent (>24hrs) due to surgical blood loss or risk of bleeding: resume asa and plavix  09/19/2023

## 2023-09-18 NOTE — Transfer of Care (Signed)
 Immediate Anesthesia Transfer of Care Note  Patient: [name]  Procedure(s) Performed: LEFT REVERSE TOTAL SHOULDER ARTHROPLASTY (Left: Shoulder)  Patient Location: PACU  Anesthesia Type:General  Level of Consciousness: awake, alert , and oriented  Airway & Oxygen Therapy: Patient Spontanous Breathing  Post-op Assessment: Report given to RN  Post vital signs: Reviewed and stable  Last Vitals:  Vitals Value Taken Time  BP 123/62 09/18/23 1530  Temp    Pulse 76 09/18/23 1533  Resp 19 09/18/23 1533  SpO2 98 % 09/18/23 1533  Vitals shown include unfiled device data.  Last Pain:  Vitals:   09/18/23 1108  TempSrc:   PainSc: 6       Patients Stated Pain Goal: 0 (09/18/23 1108)  Complications: No notable events documented.

## 2023-09-18 NOTE — Op Note (Signed)
 Date: September 18, 2023   PRE-OPERATIVE DIAGNOSIS: Left proximal humerus fracture   POST-OPERATIVE DIAGNOSIS:  Same   PROCEDURE:  1.  Left REVERSE SHOULDER ARTHROPLASTY 2.  Open reduction internal fixation of greater and lesser tuberosities of left proximal humerus fracture   SURGEON:  Selinda Belvie Gosling, MD   ASSISTANT: Magdalene Fireman, RNFA   ANESTHESIA:   General with a block   ESTIMATED BLOOD LOSS: See anesthesia record   PREOPERATIVE INDICATIONS: Ms. [name] is a right-hand-dominant 78 year old female who sustained a left proximal humerus fracture following a fall.  Due to the comminution and displaced nature of the fracture and head splitting components we discussed operative management.  We discussed moving forward with reverse shoulder arthroplasty for this injury to allow earlier weight bearing on a walker and/or cane as needed and lower risk of AVN, non union or progression of shoulder arthritis following the fracture. Thus we elected to proceed with reverse shoulder arthroplasty for the plan.  The risks benefits and alternatives were discussed with the patient preoperatively including but not limited to the risks of infection, bleeding, nerve injury, cardiopulmonary complications, the need for revision surgery, dislocation, brachial plexus palsy, incomplete relief of pain, among others, and the patient was willing to proceed. The patient did provided informed consent.   OPERATIVE IMPLANTS: Biomet size 11 humeral stem press fitted  with a 40mm +3 polyethylene liner and a -6 extended tray and a 36 mm +0 glenosphere with a 25 mm (mini) baseplate and 4, 4.75 locking screws and one central 6.5 mm nonlocking screw.   OPERATIVE FINDINGS:   Significant essentially 2 part proximal humerus fracture with greater than 200% displacement.  The proximal aspect of the humeral shaft piece had displaced significantly medially as well as deep to the conjoined tendon.  Conjoined tendon was intact.   There were nondisplaced fracture components of the lesser and greater tuberosities.  There was a 100% fracture through the surgical neck.  Also of note the patient has significant thoracic degenerative kyphosis which did bring her thoracic rib wall very close to the proximal aspect of the humeral shaft piece.  She was very thin as well.  Otherwise the remaining rotator cuff appeared intact.   OPERATIVE PROCEDURE: The patient was brought to the operating room and placed in the supine position. General anesthesia was administered. IV antibiotics were given. Time out was performed. The upper extremity was prepped and draped in usual sterile fashion. The patient was in a beachchair position. Deltopectoral approach was carried out. After dissection through skin and subcutaneous fat, the cephalic vein was identified with the deltopectoral interval.  This was mobilized and taken laterally.  At this juncture we recognize that there was significant displacement of the humeral shaft piece deep to the conjoined tendon and very medial with proximal aspect in contact with the thoracic wall.   The fracture was identified and working through the fracture in the biceps groove the lesser and greater tuberosities were freed up and tagged with #2 Fiber wire sutures.  The humeral head was fragmented and removed from the wound.     Next, the long head of the biceps tendon was released.  I then performed circumferential releases of the humerus.  I then moved to sizing the humerus.  There was moderate calcar bone loss and this was used to reference the height of the stem, as was the upper border of the pectoralis major tendon.  The canal was reamed and found to fit best with a  11 mm stem.   We next turned to the glenoid.  Deep retractors were placed, and I resected the labrum as well as the residual long head of biceps, and then placed a guidepin into the center position on the glenoid, with slight inferior declination. I then  reamed over the guidepin, and this created a small metaphyseal cancellus blush inferiorly, removing just the cartilage to the subchondral bone superiorly. The base plate was selected and impacted place, and then I secured it centrally with a nonlocking screw, and I had excellent purchase both inferiorly and superiorly. I placed a short locking screws on anterior aspect,  And posterior aspect.   I then turned my attention to the glenosphere, and impacted this into place, placing slight inferior offset.    The glenoid sphere was completely seated, and had engagement of the [facility] taper. I then turned my attention back to the humerus.    The 11 mm stem was seated to the appropriate height to allow approximate 5.6 cm from the top of the Pectoralis major tendon to the top of the glenosphere.  We did use some humeral head autograft to help firm up the fit and our millimeter press-fit the stem.  The stem was placed in 30 degrees of retroversion.    Once the stem was impacted into place we trialed poly liners.  Using the -6 extended humeral metaglen,  The shoulder had excellent motion, and was stable.  The final poly, +3 mm, was impacted and again showed good motion and stability.  Next, I irrigated the wounds copiously.    The greater tuberosity was brought back to the humeral stem suture holes and secured with bone graft from the humeral head as augment.  Likewise, lesser tuberosity repaired back to the stem with sutures based along the introspect of the stem.  The axillary nerve was palpated at the end of implanting, and found to be in continuity and not under undo tension.   I then irrigated the shoulder copiously once more, placed 1 g of vancomycin  powder into the interval, repaired the deltopectoral interval with Vicryl followed by subcutaneous monocryl and then subcuticular monocryl with Steri-Strips and sterile gauze for the skin. The patient was awakened and returned back in stable and satisfactory  condition. There no complications and he tolerated the procedure well.  All counts were correct.  The patient awakened from general anesthesia with no complications and transferred to PACU in stable condition.   Postoperative Plan: [name] will remain in the sling for approximately 1 month.  She will begin active elbow, hand and wrist range of motion immediately.  However will be quiet at the shoulder for the first 2 weeks.  We will then begin pendulums at 2 weeks.  Full passive range of motion through the shoulder at 4 weeks and begin active range of motion of the shoulder 6 weeks postop.  She will be admitted to our service with the hospitalist following along due to medical comorbidities.

## 2023-09-18 NOTE — Consult Note (Signed)
 [facility] Initial Consultation Note  Patient Name: [name]    FMW:[id number] PCP: Marvene Prentice SAUNDERS, FNP     DOB: [birth year]  DOA: 09/18/2023 DOS: the patient was seen and examined on 09/18/2023  Primary Service: Sharl Selinda Dover, MD Referring physician: Dr. Sharl Reason for consult: Medical management  Assessment/Plan Left proximal humerus fracture. Underwent left reverse shoulder arthroplasty with ORIF. Had general anesthesia with a block. Pain appears to be well-controlled for now. Monitor for worsening respiratory status.  Type 2 diabetes mellitus, uncontrolled with hyper and hypoglycemia. Recommend to hold her oral hypoglycemic agents for now. Using sensitive sliding scale only. Monitor.  CAD. TIA. Patient is on aspirin  and Plavix . Resumption per orthopedics.  HTN. Blood pressure currently soft. Will provide IV hydration. Hold amlodipine  and losartan . Patient is actually not on Cardizem .  Will discontinue that medication on discharge.  HLD. Continue statin.  COPD. Currently stable. DuoNebs as needed.  We will continue to follow the patient.   HPI: [name] is a 78 y.o. female with Past medical history of CAD, TIA, COPD, type II DM, osteoarthritis. Present to the hospital for management of her left humerus fracture. Patient sustained a ground-level fall.  No syncope.  Was found to have displaced proximal humerus fracture.  Seen by orthopedic in the clinic and decided that the patient should proceed with left reverse total arthroplasty. Cardiology saw the patient in the clinic for preop clearance. Patient stopped her Plavix  and aspirin  and underwent the surgery. At the time of my evaluation patient denies any acute complaint.  No nausea or vomiting.  No chest pain.  No shortness of breath.  Pain appears to be well-controlled.  Patient is coming from Home  Review of Systems: as mentioned in the history of present illness.  All other systems  reviewed and are negative.  Past Medical History:  Diagnosis Date   Asthma    Breast cancer (HCC)    right breast   COPD (chronic obstructive pulmonary disease) (HCC)    Coronary artery disease    Diabetes mellitus without complication (HCC)    Diverticulum of esophagus    Elevated LFTs    Emphysema lung (HCC)    ETOH abuse    H/O atrial fibrillation without current medication    only one time when she had sepsis   History of pneumonia    Hyperlipidemia    Hypertension    hx of but not on any medications   Myocardial infarction (HCC) 2000   OA (osteoarthritis) of knee    Osteoarthritis    TIA (transient ischemic attack)    Tobacco abuse    Past Surgical History:  Procedure Laterality Date   ANTERIOR HIP REVISION Right 10/22/2015   Procedure: RIGHT  HIP REVISION;  Surgeon: Donnice Car, MD;  Location: [facility];  Service: Orthopedics;  Laterality: Right;   APPLICATION OF WOUND VAC N/A 06/20/2015   Procedure: APPLICATION OF INCISIONAL WOUND VAC;  Surgeon: Donaciano Sprang, MD;  [facility];  Service: Orthopedics;  Laterality: N/A;   BREAST SURGERY  1991   right mastectomy   BRONCHIAL WASHINGS  01/02/2023   Procedure: BRONCHIAL WASHINGS;  Surgeon: Mannam, Praveen, MD;  [facility]: [facility];  Service: Cardiopulmonary;;   CARDIAC CATHETERIZATION  04/05/2009   EF 60%   CARDIOVASCULAR STRESS TEST  01/31/2005   EF 58%   CESAREAN SECTION  '78, '80, '81   x 3   CORONARY ANGIOPLASTY  08/1998   x2 OF A BIFURCATION OM-1,  OM-2 LESION   CORONARY ANGIOPLASTY WITH STENT PLACEMENT  01/1999   MID FIRST OBTUSE MARGINAL VESSEL   CORONARY ANGIOPLASTY WITH STENT PLACEMENT  07/1999   STENTING AT THE CRUX OF THE RIGHT CORONARY ARTERY WITH A 3.8MM X TETRA STENT   DIRECT LARYNGOSCOPY N/A 05/03/2015   Procedure: DIRECT LARYNGOSCOPY;  Surgeon: Marlyce Finer, MD;  Location: [facility] OR;  Service: ENT;  Laterality: N/A;   EYE SURGERY  05/18/2014,06/01/2014   BILATERAL CATARACT S WITH LENS IMPLANTS    GASTROSTOMY N/A 05/04/2015   Procedure: OPEN GASTROSTOMY WITH TUBE PLACEMENT;  Surgeon: Donnice Lima, MD;  [facility];  Service: General;  Laterality: N/A;   GASTROSTOMY N/A 11/13/2016   Procedure: INSERTION OF GASTROSTOMY TUBE;  Surgeon: Vicenta Poli, MD;  [facility];  Service: General;  Laterality: N/A;   HARDWARE REMOVAL N/A 05/03/2015   Procedure: HARDWARE REMOVAL;  Surgeon: Donaciano Sprang, MD;  [facility];  Service: Orthopedics;  Laterality: N/A;   HIP CLOSED REDUCTION Right 04/26/2015   Procedure: CLOSED REDUCTION HIP;  Surgeon: Donaciano Sprang, MD;  Location: [facility];  Service: Orthopedics;  Laterality: Right;   HYSTEROSCOPY     D & C   INCISION AND DRAINAGE ABSCESS N/A 05/03/2015   Procedure: INCISION AND DRAINAGE CERVICAL  ABSCESS AND REMOVAL OF HARDWARE;  Surgeon: Donaciano Sprang, MD;  [facility];  Service: Orthopedics;  Laterality: N/A;   IR CM INJ ANY COLONIC TUBE W/FLUORO  10/14/2017   IR CM INJ ANY COLONIC TUBE W/FLUORO  10/23/2017   IR CM INJ ANY COLONIC TUBE W/FLUORO  10/28/2017   IR REPLC GASTRO/COLONIC TUBE PERCUT W/FLUORO  09/22/2017   JOINT REPLACEMENT  08/2011   bilateral hip   JOINT REPLACEMENT  01/2012   right hip   MASTECTOMY     neck fusion  2011   ORIF FEMUR FRACTURE Right 06/14/2019   Procedure: ORIF PERI PROSTHETIC FEMUR FRACTURE;  Surgeon: Ernie Donnice, MD;  Location: [facility] OR;  Service: Orthopedics;  Laterality: Right;   PELVIC LAPAROSCOPY  2002   RSO-     RADICAL NECK DISSECTION N/A 11/08/2016   Procedure: INCISION AND DRAINAGE OF NECK ABSCESS;  Surgeon: Alm Bouche, MD;  Location: [facility] OR;  Service: ENT;  Laterality: N/A;   RADIOLOGY WITH ANESTHESIA Right 06/28/2015   Procedure: MRI OF CERVICAL SPINE  AND RIGHT HIP  WITH AND WITHOUT CONTRAST    (RADIOLOGY WITH ANESTHESIA);  Surgeon: Medication Radiologist, MD;  [facility];  Service: Radiology;  Laterality: Right;   REMOVAL OF GASTROSTOMY TUBE N/A 11/14/2016   Procedure: REMOVAL OF GASTROSTOMY  TUBE W/ REPLACEMENT OF GASTROSTOMY TUBE;  Surgeon: Vicenta Poli, MD;  [facility];  Service: General;  Laterality: N/A;   RIGID ESOPHAGOSCOPY N/A 05/03/2015   Procedure: RIGID ESOPHAGOSCOPY;  Surgeon: Marlyce Finer, MD;  Location: [facility] OR;  Service: ENT;  Laterality: N/A;   TONSILLECTOMY AND ADENOIDECTOMY     TOTAL HIP ARTHROPLASTY  08/2010   bilat   VIDEO BRONCHOSCOPY N/A 01/02/2023   Procedure: VIDEO BRONCHOSCOPY WITHOUT FLUORO;  Surgeon: Theophilus Roosevelt, MD;  [facility];  Service: Cardiopulmonary;  Laterality: N/A;   VULVECTOMY  1981   partial   Social History:  reports that she has been smoking cigarettes. She has never used smokeless tobacco. She reports that she does not drink alcohol and does not use drugs. Allergies  Allergen Reactions   Cefuroxime Other (See Comments)    Oral ulcers   Cephalexin  Other (See Comments)  Took off first layer of skin inside of mouth   Chlorhexidine  Other (See Comments)    Mouth broke out- oral ulcers   Prednisone  Other (See Comments)    Possible oral ulcers   Penicillins Rash    Pt reports questionable childhood mild reaction.  Has patient had a PCN reaction causing immediate rash, facial/tongue/throat swelling, SOB or lightheadedness with hypotension: unsure Has patient had a PCN reaction causing severe rash involving mucus membranes or skin necrosis: unsure Has patient had a PCN reaction that required hospitalization No Has patient had a PCN reaction occurring within the last 10 years: Yes If all of the above answers are NO, then may proceed with Cephalosporin use.   Zithromax [Azithromycin Dihydrate] Other (See Comments)    ORAL ULCERS    Family History  Problem Relation Age of Onset   Diabetes Mother    Hypertension Father    Heart disease Father    Heart attack Father    Stroke Father    Prior to Admission medications   Medication Sig Start Date End Date Taking? Authorizing Provider  albuterol  (PROVENTIL   HFA;VENTOLIN  HFA) 108 (90 BASE) MCG/ACT inhaler Inhale 2 puffs into the lungs every 6 (six) hours as needed for wheezing or shortness of breath.   Yes [provider]  amLODipine  (NORVASC ) 10 MG tablet Take 10 mg by mouth daily. 03/17/23  Yes [provider]  aspirin  EC 81 MG tablet Take 1 tablet (81 mg total) by mouth daily. Swallow whole. 11/20/22  Yes Sheikh, Omair Latif, DO  clopidogrel  (PLAVIX ) 75 MG tablet Take 75 mg by mouth daily.   Yes [provider]  gabapentin  (NEURONTIN ) 600 MG tablet Take 600 mg by mouth 2 (two) times daily as needed (pain).   Yes [provider]  losartan  (COZAAR ) 50 MG tablet TAKE 1 TABLET BY MOUTH EVERY DAY 09/10/23  Yes Jordan, Peter M, MD  metFORMIN  (GLUCOPHAGE -XR) 500 MG 24 hr tablet Take 500 mg by mouth daily with breakfast.   Yes [provider]  Multiple Vitamins-Minerals (CENTRUM SILVER 50+WOMEN) TABS Take 1 tablet by mouth daily with breakfast.   Yes [provider]  mupirocin  ointment (BACTROBAN ) 2 % Place 1 Application into the nose 2 (two) times daily for 60 doses. Use as directed 2 times daily for 5 days every other week for 6 weeks. 09/18/23 10/18/23 Yes Sharl Selinda Dover, MD  oxyCODONE  (ROXICODONE ) 5 MG immediate release tablet Take 1 tablet (5 mg total) by mouth every 4 (four) hours as needed for moderate pain (pain score 4-6) or severe pain (pain score 7-10). 09/18/23 09/17/24 Yes Sharl Selinda Dover, MD  oxyCODONE -acetaminophen  (PERCOCET) 10-325 MG tablet Take 1 tablet by mouth every 6 (six) hours as needed for pain. 09/02/23  Yes [provider]  rosuvastatin  (CRESTOR ) 10 MG tablet TAKE 1 TABLET BY MOUTH EVERY DAY 04/20/23  Yes Jordan, Peter M, MD  TYLENOL  500 MG tablet Take 1,000 mg by mouth every 6 (six) hours as needed for headache or mild pain.   Yes [provider]  methocarbamol  (ROBAXIN ) 500 MG tablet Take 500 mg by mouth daily as needed for muscle spasms. 09/05/22   [provider]  ondansetron  (ZOFRAN ) 4 MG tablet Take 1 tablet (4 mg total) by mouth every 6 (six) hours as needed for nausea. 11/19/22   Sheikh, Omair Latif, DO  sodium chloride  HYPERTONIC 3 % nebulizer solution Take 4 mLs by nebulization every 12 (twelve) hours. 01/05/23   Regalado, Owen LABOR, MD  Physical Exam: Vitals:   09/18/23 1700 09/18/23 1730 09/18/23 1800 09/18/23 1830  BP: (!) 92/55 110/60 112/62 111/65  Pulse: 72 71 74 86  Resp: 19 19 (!) 21 20  Temp:   97.6 F (36.4 C) 97.8 F (36.6 C)  TempSrc:    Oral  SpO2: 95% 95% 97% 90%  Weight:      Height:       General: Appear in mild distress; Cardiovascular: S1 and S2 Present, aortic systolic Murmur, Respiratory: good respiratory effort, Bilateral Air entry present, CTA, no Crackles, no wheezes Abdomen: Bowel Sound present, Non tender  Extremities: no Pedal edema Neurology: alert and oriented to time, place, and person  Data Reviewed: Since last encounter, pertinent lab results CBC and BMP   . I have ordered test including CBC and BMP  .   Family Communication: No one at bedside Thank you very much for involving us  in care of your patient.  Author: Yetta Blanch, MD [facility] 09/18/2023 7:06 PM    To reach us , Look up on care teams to locate the [facility] team or provider name and reach out to them via secure chat or [email or web address]. Between 7PM-7AM, please contact night-coverage. If you still have difficulty reaching the attending provider, please page the Director on Call-DOC for [facility] on amion for assistance.

## 2023-09-18 NOTE — Anesthesia Postprocedure Evaluation (Signed)
 Anesthesia Post Note  Patient: [name]  Procedure(s) Performed: LEFT REVERSE TOTAL SHOULDER ARTHROPLASTY (Left: Shoulder)     Patient location during evaluation: PACU Anesthesia Type: General Level of consciousness: awake and alert and oriented Pain management: pain level controlled Vital Signs Assessment: post-procedure vital signs reviewed and stable Respiratory status: spontaneous breathing, nonlabored ventilation and respiratory function stable Cardiovascular status: blood pressure returned to baseline and stable Postop Assessment: no apparent nausea or vomiting Anesthetic complications: no   No notable events documented.  Last Vitals:  Vitals:   09/18/23 1615 09/18/23 1630  BP: (!) 96/54 (!) 101/52  Pulse: 75 74  Resp: 19 19  Temp:    SpO2: 93% 95%    Last Pain:  Vitals:   09/18/23 1630  TempSrc:   PainSc: 0-No pain                 Ry Moody A.

## 2023-09-18 NOTE — Progress Notes (Signed)
 Ok to use right hand for PIV per Dr. Malen Gauze.

## 2023-09-18 NOTE — H&P (Addendum)
 ORTHOPAEDIC H&P REQUESTING PHYSICIAN: Sharl Rachel Dover, MD  PCP:  Marvene Prentice SAUNDERS, FNP  Chief Complaint: Left proximal humerus fracture  HPI: [name] is a 78 y.o. female who complains of pain and dysfunction of the left shoulder following a ground-level fall.  This resulted in a 100% displaced proximal humerus fracture with comminution and head splitting component.  Indicated for reverse arthroplasty.  Here today for surgery.  Past Medical History:  Diagnosis Date   Asthma    Breast cancer (HCC)    right breast   COPD (chronic obstructive pulmonary disease) (HCC)    Coronary artery disease    Diabetes mellitus without complication (HCC)    Diverticulum of esophagus    Elevated LFTs    Emphysema lung (HCC)    ETOH abuse    H/O atrial fibrillation without current medication    only one time when she had sepsis   History of pneumonia    Hyperlipidemia    Hypertension    hx of but not on any medications   Myocardial infarction (HCC) 2000   OA (osteoarthritis) of knee    Osteoarthritis    TIA (transient ischemic attack)    Tobacco abuse    Past Surgical History:  Procedure Laterality Date   ANTERIOR HIP REVISION Right 10/22/2015   Procedure: RIGHT  HIP REVISION;  Surgeon: Donnice Car, MD;  Location: [facility];  Service: Orthopedics;  Laterality: Right;   APPLICATION OF WOUND VAC N/A 06/20/2015   Procedure: APPLICATION OF INCISIONAL WOUND VAC;  Surgeon: Donaciano Sprang, MD;  [facility]: [facility];  Service: Orthopedics;  Laterality: N/A;   BREAST SURGERY  1991   right mastectomy   BRONCHIAL WASHINGS  01/02/2023   Procedure: BRONCHIAL WASHINGS;  Surgeon: Mannam, Praveen, MD;  [facility]: [facility];  Service: Cardiopulmonary;;   CARDIAC CATHETERIZATION  04/05/2009   EF 60%   CARDIOVASCULAR STRESS TEST  01/31/2005   EF 58%   CESAREAN SECTION  '78, '80, '81   x 3   CORONARY ANGIOPLASTY  08/1998   x2 OF A BIFURCATION OM-1, OM-2 LESION   CORONARY ANGIOPLASTY WITH STENT  PLACEMENT  01/1999   MID FIRST OBTUSE MARGINAL VESSEL   CORONARY ANGIOPLASTY WITH STENT PLACEMENT  07/1999   STENTING AT THE CRUX OF THE RIGHT CORONARY ARTERY WITH A 3.8MM X TETRA STENT   DIRECT LARYNGOSCOPY N/A 05/03/2015   Procedure: DIRECT LARYNGOSCOPY;  Surgeon: Marlyce Finer, MD;  Location: [facility] OR;  Service: ENT;  Laterality: N/A;   EYE SURGERY  05/18/2014,06/01/2014   BILATERAL CATARACT S WITH LENS IMPLANTS   GASTROSTOMY N/A 05/04/2015   Procedure: OPEN GASTROSTOMY WITH TUBE PLACEMENT;  Surgeon: Donnice Lima, MD;  [facility];  Service: General;  Laterality: N/A;   GASTROSTOMY N/A 11/13/2016   Procedure: INSERTION OF GASTROSTOMY TUBE;  Surgeon: Vicenta Poli, MD;  [facility];  Service: General;  Laterality: N/A;   HARDWARE REMOVAL N/A 05/03/2015   Procedure: HARDWARE REMOVAL;  Surgeon: Donaciano Sprang, MD;  [facility];  Service: Orthopedics;  Laterality: N/A;   HIP CLOSED REDUCTION Right 04/26/2015   Procedure: CLOSED REDUCTION HIP;  Surgeon: Donaciano Sprang, MD;  Location: [facility];  Service: Orthopedics;  Laterality: Right;   HYSTEROSCOPY     D & C   INCISION AND DRAINAGE ABSCESS N/A 05/03/2015   Procedure: INCISION AND DRAINAGE CERVICAL  ABSCESS AND REMOVAL OF HARDWARE;  Surgeon: Donaciano Sprang, MD;  [facility];  Service: Orthopedics;  Laterality: N/A;  IR CM INJ ANY COLONIC TUBE W/FLUORO  10/14/2017   IR CM INJ ANY COLONIC TUBE W/FLUORO  10/23/2017   IR CM INJ ANY COLONIC TUBE W/FLUORO  10/28/2017   IR REPLC GASTRO/COLONIC TUBE PERCUT W/FLUORO  09/22/2017   JOINT REPLACEMENT  08/2011   bilateral hip   JOINT REPLACEMENT  01/2012   right hip   MASTECTOMY     neck fusion  2011   ORIF FEMUR FRACTURE Right 06/14/2019   Procedure: ORIF PERI PROSTHETIC FEMUR FRACTURE;  Surgeon: Ernie Cough, MD;  Location: [facility] OR;  Service: Orthopedics;  Laterality: Right;   PELVIC LAPAROSCOPY  2002   RSO-     RADICAL NECK DISSECTION N/A 11/08/2016   Procedure: INCISION AND DRAINAGE OF NECK  ABSCESS;  Surgeon: Alm Bouche, MD;  Location: [facility] OR;  Service: ENT;  Laterality: N/A;   RADIOLOGY WITH ANESTHESIA Right 06/28/2015   Procedure: MRI OF CERVICAL SPINE  AND RIGHT HIP  WITH AND WITHOUT CONTRAST    (RADIOLOGY WITH ANESTHESIA);  Surgeon: Medication Radiologist, MD;  [facility];  Service: Radiology;  Laterality: Right;   REMOVAL OF GASTROSTOMY TUBE N/A 11/14/2016   Procedure: REMOVAL OF GASTROSTOMY TUBE W/ REPLACEMENT OF GASTROSTOMY TUBE;  Surgeon: Vicenta Poli, MD;  [facility];  Service: General;  Laterality: N/A;   RIGID ESOPHAGOSCOPY N/A 05/03/2015   Procedure: RIGID ESOPHAGOSCOPY;  Surgeon: Marlyce Finer, MD;  Location: [facility] OR;  Service: ENT;  Laterality: N/A;   TONSILLECTOMY AND ADENOIDECTOMY     TOTAL HIP ARTHROPLASTY  08/2010   bilat   VIDEO BRONCHOSCOPY N/A 01/02/2023   Procedure: VIDEO BRONCHOSCOPY WITHOUT FLUORO;  Surgeon: Theophilus Roosevelt, MD;  [facility];  Service: Cardiopulmonary;  Laterality: N/A;   VULVECTOMY  1981   partial   Social History   Socioeconomic History   Marital status: Married    Spouse name: Not on file   Number of children: 3   Years of education: Not on file   Highest education level: Not on file  Occupational History   Occupation: accountant  Tobacco Use   Smoking status: Every Day    Current packs/day: 0.50    Types: Cigarettes   Smokeless tobacco: Never   Tobacco comments:    using chantix  Vaping Use   Vaping status: Never Used  Substance and Sexual Activity   Alcohol use: No    Alcohol/week: 0.0 standard drinks of alcohol    Comment: former alcohol abuse   Drug use: No   Sexual activity: Not on file  Other Topics Concern   Not on file  Social History Narrative   Not on file   Social Drivers of Health   Financial Resource Strain: Not on file  Food Insecurity: No Food Insecurity (12/29/2022)   Hunger Vital Sign    Worried About Running Out of Food in the Last Year: Never true    Ran Out of Food in  the Last Year: Never true  Transportation Needs: No Transportation Needs (12/29/2022)   PRAPARE - Administrator, Civil Service (Medical): No    Lack of Transportation (Non-Medical): No  Physical Activity: Not on file  Stress: Not on file  Social Connections: Not on file   Family History  Problem Relation Age of Onset   Diabetes Mother    Hypertension Father    Heart disease Father    Heart attack Father    Stroke Father    Allergies  Allergen Reactions   Cefuroxime Other (See  Comments)    Oral ulcers   Cephalexin  Other (See Comments)    Took off first layer of skin inside of mouth   Chlorhexidine  Other (See Comments)    Mouth broke out- oral ulcers   Prednisone  Other (See Comments)    Possible oral ulcers   Penicillins Rash    Pt reports questionable childhood mild reaction.  Has patient had a PCN reaction causing immediate rash, facial/tongue/throat swelling, SOB or lightheadedness with hypotension: unsure Has patient had a PCN reaction causing severe rash involving mucus membranes or skin necrosis: unsure Has patient had a PCN reaction that required hospitalization No Has patient had a PCN reaction occurring within the last 10 years: Yes If all of the above answers are NO, then may proceed with Cephalosporin use.   Zithromax [Azithromycin Dihydrate] Other (See Comments)    ORAL ULCERS    Prior to Admission medications   Medication Sig Start Date End Date Taking? Authorizing Provider  albuterol  (PROVENTIL  HFA;VENTOLIN  HFA) 108 (90 BASE) MCG/ACT inhaler Inhale 2 puffs into the lungs every 6 (six) hours as needed for wheezing or shortness of breath.   Yes [provider]  amLODipine  (NORVASC ) 10 MG tablet Take 10 mg by mouth daily. 03/17/23  Yes [provider]  aspirin  EC 81 MG tablet Take 1 tablet (81 mg total) by mouth daily. Swallow whole. 11/20/22  Yes Sheikh, Omair Latif, DO  clopidogrel  (PLAVIX ) 75 MG tablet Take 75 mg by mouth daily.   Yes  [provider]  diltiazem  (CARDIZEM  CD) 120 MG 24 hr capsule Take 1 capsule (120 mg total) by mouth daily. 01/07/23  Yes Regalado, Belkys A, MD  gabapentin  (NEURONTIN ) 600 MG tablet Take 600 mg by mouth 2 (two) times daily as needed (pain).   Yes [provider]  losartan  (COZAAR ) 50 MG tablet TAKE 1 TABLET BY MOUTH EVERY DAY 09/10/23  Yes Jordan, Peter M, MD  metFORMIN  (GLUCOPHAGE -XR) 500 MG 24 hr tablet Take 500 mg by mouth daily with breakfast.   Yes [provider]  Multiple Vitamins-Minerals (CENTRUM SILVER 50+WOMEN) TABS Take 1 tablet by mouth daily with breakfast.   Yes [provider]  oxyCODONE -acetaminophen  (PERCOCET) 10-325 MG tablet Take 1 tablet by mouth every 6 (six) hours as needed for pain. 09/02/23  Yes [provider]  rosuvastatin  (CRESTOR ) 10 MG tablet TAKE 1 TABLET BY MOUTH EVERY DAY 04/20/23  Yes Jordan, Peter M, MD  TYLENOL  500 MG tablet Take 1,000 mg by mouth every 6 (six) hours as needed for headache or mild pain.   Yes [provider]  methocarbamol  (ROBAXIN ) 500 MG tablet Take 500 mg by mouth daily as needed for muscle spasms. 09/05/22   [provider]  ondansetron  (ZOFRAN ) 4 MG tablet Take 1 tablet (4 mg total) by mouth every 6 (six) hours as needed for nausea. 11/19/22   Sherrill Cable Latif, DO  sodium chloride  HYPERTONIC 3 % nebulizer solution Take 4 mLs by nebulization every 12 (twelve) hours. 01/05/23   Regalado, Owen A, MD   No results found.  Positive ROS: All other systems have been reviewed and were otherwise negative with the exception of those mentioned in the HPI and as above.  Physical Exam: General: Alert, no acute distress Cardiovascular: No pedal edema Respiratory: No cyanosis, no use of accessory musculature GI: No organomegaly, abdomen is soft and non-tender Skin: No lesions in the area of chief complaint Neurologic: Sensation intact distally Psychiatric: Patient is competent for consent  with normal  mood and affect Lymphatic: No axillary or cervical lymphadenopathy  MUSCULOSKELETAL: Left upper extremity is warm and well-perfused.  Still some residual bruising down into the antecubital fossa and along the volar forearm.  Otherwise distally neurovascular intact.  Assessment: Left proximal humerus fracture  Plan: Plan to proceed today with reverse arthroplasty for definitive treatment.  We again discussed the risk and benefits of the procedure in detail.  These include but are not limited to bleeding, infection, damage to surrounding nerves and vessels, stiffness, dislocation, fracture and periprosthetic fashion, need for revision surgery as well as the risk of anesthesia.  She has provided informed consent.  Admit post op for obs    Rachel Belvie Gosling, MD Cell [phone]    09/18/2023 11:33 AM   This is a late addition to the above H&P.  Once we get the patient is sleeping were able to roll her we did note there was a posterior elbow pressure sore that appeared to be initially a stage III.  However was in some early stages of healing with mature scab noted but some surrounding erythema but no blanching and no signs of infectious process.

## 2023-09-18 NOTE — Discharge Instructions (Signed)
 Orthopedic surgery discharge instructions:  -Maintain postoperative bandages until your follow-up appointment.  This is an occlusive dressing and you may shower with this in place.  You may begin showering in 2 days.  Please do not submerge underwater.  -Maintain your arm in sling at all times.  You should only remove for showering and getting dressed.  No lifting with the operative arm.  -You may begin using your hand, elbow, and wrist immediately following surgery.  -For mild to moderate pain use Tylenol and Advil in alternating fashion around-the-clock.  For breakthrough pain use oxycodone as necessary.  -Please apply ice to the right shoulder for 20-30 minutes out of each hour that you are awake.  Do this around-the-clock for the first 3 days from surgery.  -Follow-up in 2 weeks for routine postoperative check.

## 2023-09-19 DIAGNOSIS — Z8673 Personal history of transient ischemic attack (TIA), and cerebral infarction without residual deficits: Secondary | ICD-10-CM | POA: Diagnosis not present

## 2023-09-19 DIAGNOSIS — E119 Type 2 diabetes mellitus without complications: Secondary | ICD-10-CM | POA: Diagnosis not present

## 2023-09-19 DIAGNOSIS — Z96643 Presence of artificial hip joint, bilateral: Secondary | ICD-10-CM | POA: Diagnosis not present

## 2023-09-19 DIAGNOSIS — Z955 Presence of coronary angioplasty implant and graft: Secondary | ICD-10-CM | POA: Diagnosis not present

## 2023-09-19 DIAGNOSIS — Z79899 Other long term (current) drug therapy: Secondary | ICD-10-CM | POA: Diagnosis not present

## 2023-09-19 DIAGNOSIS — S42202A Unspecified fracture of upper end of left humerus, initial encounter for closed fracture: Secondary | ICD-10-CM | POA: Diagnosis not present

## 2023-09-19 DIAGNOSIS — F17210 Nicotine dependence, cigarettes, uncomplicated: Secondary | ICD-10-CM | POA: Diagnosis not present

## 2023-09-19 DIAGNOSIS — Z96612 Presence of left artificial shoulder joint: Secondary | ICD-10-CM | POA: Diagnosis not present

## 2023-09-19 DIAGNOSIS — J449 Chronic obstructive pulmonary disease, unspecified: Secondary | ICD-10-CM | POA: Diagnosis not present

## 2023-09-19 DIAGNOSIS — Z853 Personal history of malignant neoplasm of breast: Secondary | ICD-10-CM | POA: Diagnosis not present

## 2023-09-19 DIAGNOSIS — I10 Essential (primary) hypertension: Secondary | ICD-10-CM | POA: Diagnosis not present

## 2023-09-19 DIAGNOSIS — I2510 Atherosclerotic heart disease of native coronary artery without angina pectoris: Secondary | ICD-10-CM | POA: Diagnosis not present

## 2023-09-19 DIAGNOSIS — I4891 Unspecified atrial fibrillation: Secondary | ICD-10-CM | POA: Diagnosis not present

## 2023-09-19 DIAGNOSIS — Z7982 Long term (current) use of aspirin: Secondary | ICD-10-CM | POA: Diagnosis not present

## 2023-09-19 DIAGNOSIS — Z7902 Long term (current) use of antithrombotics/antiplatelets: Secondary | ICD-10-CM | POA: Diagnosis not present

## 2023-09-19 LAB — COMPREHENSIVE METABOLIC PANEL
ALT: 12 U/L (ref 0–44)
AST: 18 U/L (ref 15–41)
Albumin: 2.6 g/dL — ABNORMAL LOW (ref 3.5–5.0)
Alkaline Phosphatase: 119 U/L (ref 38–126)
Anion gap: 9 (ref 5–15)
BUN: 8 mg/dL (ref 8–23)
CO2: 24 mmol/L (ref 22–32)
Calcium: 8.4 mg/dL — ABNORMAL LOW (ref 8.9–10.3)
Chloride: 100 mmol/L (ref 98–111)
Creatinine, Ser: 0.71 mg/dL (ref 0.44–1.00)
GFR, Estimated: >60 mL/min (ref >60)
Glucose, Bld: 111 mg/dL — ABNORMAL HIGH (ref 70–99)
Potassium: 3.7 mmol/L (ref 3.5–5.1)
Sodium: 133 mmol/L — ABNORMAL LOW (ref 135–145)
Total Bilirubin: 0.9 mg/dL (ref 0.0–1.2)
Total Protein: 6 g/dL — ABNORMAL LOW (ref 6.5–8.1)

## 2023-09-19 LAB — CBC WITH DIFFERENTIAL/PLATELET
Abs Immature Granulocytes: 0.03 10*3/uL (ref 0.00–0.07)
Basophils Absolute: 0 10*3/uL (ref 0.0–0.1)
Basophils Relative: 0 %
Eosinophils Absolute: 0.1 10*3/uL (ref 0.0–0.5)
Eosinophils Relative: 1 %
HCT: 33.3 % — ABNORMAL LOW (ref 36.0–46.0)
Hemoglobin: 11.1 g/dL — ABNORMAL LOW (ref 12.0–15.0)
Immature Granulocytes: 0 %
Lymphocytes Relative: 12 %
Lymphs Abs: 1.1 10*3/uL (ref 0.7–4.0)
MCH: 30.6 pg (ref 26.0–34.0)
MCHC: 33.3 g/dL (ref 30.0–36.0)
MCV: 91.7 fL (ref 80.0–100.0)
Monocytes Absolute: 0.9 10*3/uL (ref 0.1–1.0)
Monocytes Relative: 11 %
Neutro Abs: 6.6 10*3/uL (ref 1.7–7.7)
Neutrophils Relative %: 76 %
Platelets: 238 10*3/uL (ref 150–400)
RBC: 3.63 MIL/uL — ABNORMAL LOW (ref 3.87–5.11)
RDW: 14.6 % (ref 11.5–15.5)
WBC: 8.7 10*3/uL (ref 4.0–10.5)
nRBC: 0 % (ref 0.0–0.2)

## 2023-09-19 LAB — GLUCOSE, CAPILLARY
Glucose-Capillary: 119 mg/dL — ABNORMAL HIGH (ref 70–99)
Glucose-Capillary: 120 mg/dL — ABNORMAL HIGH (ref 70–99)
Glucose-Capillary: 148 mg/dL — ABNORMAL HIGH (ref 70–99)
Glucose-Capillary: 131 mg/dL — ABNORMAL HIGH (ref 70–99)

## 2023-09-19 LAB — MAGNESIUM: Magnesium: 1.7 mg/dL (ref 1.7–2.4)

## 2023-09-19 MED ORDER — ENSURE ENLIVE PO LIQD
237.0000 mL | Freq: Three times a day (TID) | ORAL | Status: DC
  Administered 2023-09-19: 237 mL via ORAL

## 2023-09-19 NOTE — Evaluation (Signed)
 Occupational Therapy Evaluation Patient Details Name: [name] MRN: [id number] DOB: [birth year] Today's Date: 09/19/2023   History of Present Illness Pt is a 78 yo female who presents to [facility] on 09/18/23 after a ground level fall sustaining a L humerus fx, she is now s/p L RTSA and is NWB on stated UE. PMH of CAD, TIA, COPD, DM II, OA, Emphysema, R hip revision.   Clinical Impression   Pt admitted for above, she was very brief with OT and was reluctant to do much in session. She states I have help at home. Educated pt on AROM of uninvolved joints, UBD and sling use/fitting, she does need a fair amount of assist with both tasks and needs help scooting forward to EOB. OT to make efforts to follow-up with pt tomorrow in hopes of more compliance with compensatory strategy education. Pt to follow Physician's plan at DC for any post acute rehab.       If plan is discharge home, recommend the following: A lot of help with bathing/dressing/bathroom;Assistance with cooking/housework;Assist for transportation    Functional Status Assessment  Patient has had a recent decline in their functional status and demonstrates the ability to make significant improvements in function in a reasonable and predictable amount of time.  Equipment Recommendations  None recommended by OT    Recommendations for Other Services       Precautions / Restrictions Precautions Precautions: Shoulder Type of Shoulder Precautions: quiet shoulder per MD. No ROM Shoulder Interventions: Shoulder sling/immobilizer Precaution Booklet Issued: Yes (comment) Precaution Comments: shoulder DC packet Required Braces or Orthoses: Sling (LUE) Restrictions Weight Bearing Restrictions Per Provider Order: Yes LUE Weight Bearing Per Provider Order: Non weight bearing      Mobility Bed Mobility Overal bed mobility: Needs Assistance Bed Mobility: Sidelying to Sit, Sit to Supine   Sidelying to sit: Supervision   Sit to  supine: Supervision   General bed mobility comments: Pt needing mod A + pad to scoot to EOB with her assist of RUE    Transfers                   General transfer comment: pt declined      Balance Overall balance assessment: Needs assistance Sitting-balance support: Feet supported, No upper extremity supported Sitting balance-Leahy Scale: Good Sitting balance - Comments: sitting EOB                                   ADL either performed or assessed with clinical judgement   ADL                   Upper Body Dressing : Sitting;Moderate assistance Upper Body Dressing Details (indicate cue type and reason): don gown like a jacket                   General ADL Comments: Pt very brief with OT, stating she just wants to lay back down. Reviewed with pt proper sling setup and AROM of uninvolved joints. Messaged team to inform them of pt status and reluctance to fully participate in therapy.     Vision         Perception         Praxis         Pertinent Vitals/Pain Pain Assessment Pain Assessment: 0-10 Pain Score: 4  Pain Location: L shoulder Pain Descriptors / Indicators: Aching, Sore Pain Intervention(s): Limited  activity within patient's tolerance, RN gave pain meds during session, Monitored during session     Extremity/Trunk Assessment Upper Extremity Assessment Upper Extremity Assessment: LUE deficits/detail;Right hand dominant LUE Deficits / Details: in sling, tingling along medial dorsal aspect of forearm LUE: Unable to fully assess due to immobilization LUE Sensation: decreased light touch           Communication Communication Communication: No apparent difficulties   Cognition Arousal: Lethargic Behavior During Therapy: Agitated Overall Cognitive Status: Within Functional Limits for tasks assessed                                 General Comments: Pt very breif with OT and seemingly irriated with  efforts to get to EOB and review precautions.     General Comments  Messaged team about pt limited compliance with therapy. Pt Sp02 88% on RA and 90% on 1L    Exercises     Shoulder Instructions      Home Living Family/patient expects to be discharged to:: Private residence Living Arrangements: Spouse/significant other Available Help at Discharge: Family;Available 24 hours/day Type of Home: House Home Access: Level entry (no steps through garage)     Home Layout: One level     Bathroom Shower/Tub: Walk-in shower;Tub/shower unit   Bathroom Toilet: Handicapped height     Home Equipment: BSC/3in1;Grab bars - toilet;Shower seat - built in;Rollator (4 wheels);Cane - single point   Additional Comments: Only the fall leading to admission      Prior Functioning/Environment Prior Level of Function : Independent/Modified Independent;Driving;History of Falls (last six months)             Mobility Comments: Using SPC with R hand ADLs Comments: Ind        OT Problem List: Decreased knowledge of precautions;Impaired UE functional use;Pain;Decreased range of motion      OT Treatment/Interventions: Self-care/ADL training;Therapeutic exercise;Balance training;Patient/family education;Therapeutic activities    OT Goals(Current goals can be found in the care plan section) Acute Rehab OT Goals Patient Stated Goal: To go home OT Goal Formulation: With patient Time For Goal Achievement: 10/03/23 Potential to Achieve Goals: Fair ADL Goals Pt Will Perform Grooming: with set-up;standing Pt Will Perform Lower Body Dressing: with set-up;sitting/lateral leans Pt Will Transfer to Toilet: ambulating;with supervision Pt Will Perform Toileting - Clothing Manipulation and hygiene: sit to/from stand;sitting/lateral leans;with set-up Pt/caregiver will Perform Home Exercise Program: Increased ROM;Left upper extremity;With written HEP provided;Independently (uninvolved joints)  OT Frequency:  Min 1X/week    Co-evaluation              AM-PAC OT 6 Clicks Daily Activity     Outcome Measure Help from another person eating meals?: A Little Help from another person taking care of personal grooming?: A Little (RUE) Help from another person toileting, which includes using toliet, bedpan, or urinal?: A Little (RUE) Help from another person bathing (including washing, rinsing, drying)?: A Lot Help from another person to put on and taking off regular upper body clothing?: A Lot Help from another person to put on and taking off regular lower body clothing?: A Lot 6 Click Score: 15   End of Session Nurse Communication: Mobility status;Precautions;Weight bearing status (LUE NWB, no AROM/PROM)  Activity Tolerance: Other (comment) (limited compliance) Patient left: in bed;with call bell/phone within reach;with bed alarm set  OT Visit Diagnosis: Pain Pain - Right/Left: Left Pain - part of body: Shoulder  Time: 9045-8985 OT Time Calculation (min): 20 min Charges:  OT General Charges $OT Visit: 1 Visit OT Evaluation $OT Eval Moderate Complexity: 1 Mod  09/19/2023  AB, OTR/L  Acute Rehabilitation Services  Office: [phone]   Curtistine JONETTA Das 09/19/2023, 10:36 AM

## 2023-09-19 NOTE — Plan of Care (Signed)
  Problem: Education: Goal: Knowledge of General Education information will improve Description: Including pain rating scale, medication(s)/side effects and non-pharmacologic comfort measures Outcome: Not Progressing   Problem: Health Behavior/Discharge Planning: Goal: Ability to manage health-related needs will improve Outcome: Not Progressing   Problem: Clinical Measurements: Goal: Ability to maintain clinical measurements within normal limits will improve Outcome: Not Progressing Goal: Will remain free from infection Outcome: Not Progressing Goal: Diagnostic test results will improve Outcome: Not Progressing Goal: Respiratory complications will improve Outcome: Not Progressing Goal: Cardiovascular complication will be avoided Outcome: Not Progressing   Problem: Activity: Goal: Risk for activity intolerance will decrease Outcome: Not Progressing   Problem: Nutrition: Goal: Adequate nutrition will be maintained Outcome: Not Progressing   Problem: Coping: Goal: Level of anxiety will decrease Outcome: Not Progressing   Problem: Elimination: Goal: Will not experience complications related to bowel motility Outcome: Not Progressing Goal: Will not experience complications related to urinary retention Outcome: Not Progressing   Problem: Pain Management: Goal: General experience of comfort will improve Outcome: Not Progressing   Problem: Safety: Goal: Ability to remain free from injury will improve Outcome: Not Progressing   Problem: Skin Integrity: Goal: Risk for impaired skin integrity will decrease Outcome: Not Progressing   Problem: Education: Goal: Ability to describe self-care measures that may prevent or decrease complications (Diabetes Survival Skills Education) will improve Outcome: Not Progressing Goal: Individualized Educational Video(s) Outcome: Not Progressing   Problem: Coping: Goal: Ability to adjust to condition or change in health will  improve Outcome: Not Progressing   Problem: Fluid Volume: Goal: Ability to maintain a balanced intake and output will improve Outcome: Not Progressing   Problem: Health Behavior/Discharge Planning: Goal: Ability to identify and utilize available resources and services will improve Outcome: Not Progressing Goal: Ability to manage health-related needs will improve Outcome: Not Progressing   Problem: Metabolic: Goal: Ability to maintain appropriate glucose levels will improve Outcome: Not Progressing   Problem: Nutritional: Goal: Maintenance of adequate nutrition will improve Outcome: Not Progressing Goal: Progress toward achieving an optimal weight will improve Outcome: Not Progressing   Problem: Skin Integrity: Goal: Risk for impaired skin integrity will decrease Outcome: Not Progressing   Problem: Tissue Perfusion: Goal: Adequacy of tissue perfusion will improve Outcome: Not Progressing   Problem: Education: Goal: Knowledge of the prescribed therapeutic regimen will improve Outcome: Not Progressing Goal: Understanding of activity limitations/precautions following surgery will improve Outcome: Not Progressing Goal: Individualized Educational Video(s) Outcome: Not Progressing   Problem: Activity: Goal: Ability to tolerate increased activity will improve Outcome: Not Progressing   Problem: Pain Management: Goal: Pain level will decrease with appropriate interventions Outcome: Not Progressing

## 2023-09-19 NOTE — Care Management Obs Status (Signed)
 MEDICARE OBSERVATION STATUS NOTIFICATION   Patient Details  Name: [name] MRN: [id number] Date of Birth: Aug 25, 1946   Medicare Observation Status Notification Given:  Yes    Ronny Bacon, RN 09/19/2023, 9:17 AM

## 2023-09-19 NOTE — Progress Notes (Signed)
   Subjective: 1 Day Post-Op Procedure(s) (LRB): LEFT REVERSE TOTAL SHOULDER ARTHROPLASTY (Left) Patient reports pain as moderate.   Patient seen in rounds for Dr. Sharl. Patient is resting in bed on exam this morning. She tells me she is having a lot of pain in her shoulder right now, and just took some pain medication a few minutes ago. No pain elsewhere.  We will start therapy today.   Objective: Vital signs in last 24 hours: Temp:  [97.6 F (36.4 C)-99.4 F (37.4 C)] 98.7 F (37.1 C) (01/04 0510) Pulse Rate:  [70-95] 70 (01/04 0510) Resp:  [12-23] 17 (01/04 0510) BP: (92-144)/(52-96) 133/70 (01/04 0510) SpO2:  [90 %-100 %] 97 % (01/04 0510) Weight:  [56.7 kg] 56.7 kg (01/03 1101)  Intake/Output from previous day:  Intake/Output Summary (Last 24 hours) at 09/19/2023 0802 Last data filed at 09/19/2023 0514 Gross per 24 hour  Intake 1935 ml  Output 900 ml  Net 1035 ml     Intake/Output this shift: No intake/output data recorded.  Labs: Recent Labs    09/19/23 0422  HGB 11.1*   Recent Labs    09/19/23 0422  WBC 8.7  RBC 3.63*  HCT 33.3*  PLT 238   Recent Labs    09/19/23 0422  NA 133*  K 3.7  CL 100  CO2 24  BUN 8  CREATININE 0.71  GLUCOSE 111*  CALCIUM  8.4*   No results for input(s): LABPT, INR in the last 72 hours.  Exam: General - Patient is Alert and Appropriate Left Shoulder: Aquacel clean and dry Sling in place Moving fingers and wrist and notes sensation is returning   Past Medical History:  Diagnosis Date   Asthma    Breast cancer (HCC)    right breast   COPD (chronic obstructive pulmonary disease) (HCC)    Coronary artery disease    Diabetes mellitus without complication (HCC)    Diverticulum of esophagus    Elevated LFTs    Emphysema lung (HCC)    ETOH abuse    H/O atrial fibrillation without current medication    only one time when she had sepsis   History of pneumonia    Hyperlipidemia    Hypertension    hx of but not  on any medications   Myocardial infarction (HCC) 2000   OA (osteoarthritis) of knee    Osteoarthritis    TIA (transient ischemic attack)    Tobacco abuse     Assessment/Plan: 1 Day Post-Op Procedure(s) (LRB): LEFT REVERSE TOTAL SHOULDER ARTHROPLASTY (Left) Principal Problem:   S/P reverse total shoulder arthroplasty, left  Estimated body mass index is 23.62 kg/m as calculated from the following:   Height as of this encounter: 5' 1 (1.549 m).   Weight as of this encounter: 56.7 kg.  Hgb stable at 11.1 this AM  Up with PT/OT as able NWB in sling Continue pain management Was going to add tylenol  back in scheduled but per [facility] patient refused this when ordered   Rosina Calin, PA-C Orthopedic Surgery [phone] 09/19/2023, 8:02 AM

## 2023-09-19 NOTE — Plan of Care (Signed)
   Problem: Health Behavior/Discharge Planning: Goal: Ability to manage health-related needs will improve Outcome: Progressing

## 2023-09-19 NOTE — Progress Notes (Signed)
 [facility] Consultation Progress Note  Patient: [name] FMW:[id number]   PCP: Marvene Prentice SAUNDERS, FNP DOB: 12/14/1945   DOA: 09/18/2023   DOS: 09/19/2023   Date of Service: the patient was seen and examined on 09/19/2023 Primary service: Sharl Selinda Dover, MD   Brief Hospital Course: [name] is a 77 y.o. female with Past medical history of CAD, TIA, COPD, type II DM, osteoarthritis. Present to the hospital for management of her left humerus fracture. [facility] was consulted for medical management. Postop blood pressure has been soft therefore holding blood pressure medication. Preoperatively her aspirin  and Plavix  was held which is resumed. Has some basal crackles likely atelectasis.   Assessment/Plan Left proximal humerus fracture. Underwent left reverse shoulder arthroplasty with ORIF. Had general anesthesia with a block. Pain appears to be well-controlled for now. Monitor for worsening respiratory status.   Type 2 diabetes mellitus, uncontrolled with hyper and hypoglycemia. Recommend to hold her oral hypoglycemic agents for now. Using sensitive sliding scale only. Monitor.   CAD. TIA. Patient is on aspirin  and Plavix . Resumption per orthopedics.   HTN. Blood pressure remains on the soft side. Will continue to hold home blood pressure medications. Patient was receiving IV hydration which is also going to be on hold given crackles developing on the lungs.   HLD. Continue statin.   COPD. Currently stable. DuoNebs as needed.  Normocytic anemia. Hemoglobin at baseline around 11.  It appeared her admission hemoglobin was 13 although she chronically has been in the range of 10-11. For now we will monitor.   We will continue to follow the patient.   Subjective: Pain well-controlled.  No nausea no vomiting no fever no chills.  Has some cough as well as shortness of breath.  No chest pain.  Objective: Vitals:   09/19/23 0510 09/19/23 0810 09/19/23 1145 09/19/23  1643  BP: 133/70 (!) 114/59 124/68 123/67  Pulse: 70 84 91 77  Resp: 17 16 20 16   Temp: 98.7 F (37.1 C) 97.9 F (36.6 C) 98.2 F (36.8 C) 97.6 F (36.4 C)  TempSrc: Oral     SpO2: 97% 90% 93% 93%  Weight:      Height:        Basal crackles production S1-S2 present No wheezing. No murmur. No edema. Bowel sound present.  Nontender.  Family Communication: No one at bedside  Data Reviewed: Since last encounter, pertinent lab results CBC and BMP   . I have ordered test including CBC and BMP  .   Author: Yetta Blanch, MD  [facility] 09/19/2023  6:58 PM  To reach On-call, Look up on care teams to locate the [facility] team or provider name and reach out to them via secure chat or [email or web address] Between 7PM-7AM, please contact night-coverage. If you still have difficulty reaching the attending provider, please page the [facility] (Director on Call) for [facility] on amion for assistance.

## 2023-09-20 DIAGNOSIS — Z955 Presence of coronary angioplasty implant and graft: Secondary | ICD-10-CM | POA: Diagnosis not present

## 2023-09-20 DIAGNOSIS — E119 Type 2 diabetes mellitus without complications: Secondary | ICD-10-CM | POA: Diagnosis not present

## 2023-09-20 DIAGNOSIS — Z79899 Other long term (current) drug therapy: Secondary | ICD-10-CM | POA: Diagnosis not present

## 2023-09-20 DIAGNOSIS — Z8673 Personal history of transient ischemic attack (TIA), and cerebral infarction without residual deficits: Secondary | ICD-10-CM | POA: Diagnosis not present

## 2023-09-20 DIAGNOSIS — Z853 Personal history of malignant neoplasm of breast: Secondary | ICD-10-CM | POA: Diagnosis not present

## 2023-09-20 DIAGNOSIS — Z96643 Presence of artificial hip joint, bilateral: Secondary | ICD-10-CM | POA: Diagnosis not present

## 2023-09-20 DIAGNOSIS — J449 Chronic obstructive pulmonary disease, unspecified: Secondary | ICD-10-CM | POA: Diagnosis not present

## 2023-09-20 DIAGNOSIS — Z96612 Presence of left artificial shoulder joint: Secondary | ICD-10-CM | POA: Diagnosis not present

## 2023-09-20 DIAGNOSIS — Z7982 Long term (current) use of aspirin: Secondary | ICD-10-CM | POA: Diagnosis not present

## 2023-09-20 DIAGNOSIS — I4891 Unspecified atrial fibrillation: Secondary | ICD-10-CM | POA: Diagnosis not present

## 2023-09-20 DIAGNOSIS — F17210 Nicotine dependence, cigarettes, uncomplicated: Secondary | ICD-10-CM | POA: Diagnosis not present

## 2023-09-20 DIAGNOSIS — S42202A Unspecified fracture of upper end of left humerus, initial encounter for closed fracture: Secondary | ICD-10-CM | POA: Diagnosis not present

## 2023-09-20 DIAGNOSIS — I2510 Atherosclerotic heart disease of native coronary artery without angina pectoris: Secondary | ICD-10-CM | POA: Diagnosis not present

## 2023-09-20 DIAGNOSIS — Z7902 Long term (current) use of antithrombotics/antiplatelets: Secondary | ICD-10-CM | POA: Diagnosis not present

## 2023-09-20 DIAGNOSIS — I10 Essential (primary) hypertension: Secondary | ICD-10-CM | POA: Diagnosis not present

## 2023-09-20 LAB — GLUCOSE, CAPILLARY: Glucose-Capillary: 110 mg/dL — ABNORMAL HIGH (ref 70–99)

## 2023-09-20 NOTE — Discharge Summary (Signed)
 In most cases prophylactic antibiotics for Dental procdeures after total joint surgery are not necessary.  Exceptions are as follows:  1. History of prior total joint infection  2. Severely immunocompromised (Organ Transplant, cancer chemotherapy, Rheumatoid biologic meds such as Humera)  3. Poorly controlled diabetes (A1C &gt; 8.0, blood glucose over 200)  If you have one of these conditions, contact your surgeon for an antibiotic prescription, prior to your dental procedure. Orthopedic Discharge Summary        Physician Discharge Summary  Patient ID: [name] MRN: [id number] DOB/AGE: [birth year] 78 y.o.  Admit date: 09/18/2023 Discharge date: 09/20/2023   Procedures:  Procedure(s) (LRB): LEFT REVERSE TOTAL SHOULDER ARTHROPLASTY (Left)  Attending Physician:  Dr. Selinda Gosling  Admission Diagnoses:   left proximal humerus fracture  Discharge Diagnoses:  left proximal humerus fracture   Past Medical History:  Diagnosis Date   Asthma    Breast cancer (HCC)    right breast   COPD (chronic obstructive pulmonary disease) (HCC)    Coronary artery disease    Diabetes mellitus without complication (HCC)    Diverticulum of esophagus    Elevated LFTs    Emphysema lung (HCC)    ETOH abuse    H/O atrial fibrillation without current medication    only one time when she had sepsis   History of pneumonia    Hyperlipidemia    Hypertension    hx of but not on any medications   Myocardial infarction (HCC) 2000   OA (osteoarthritis) of knee    Osteoarthritis    TIA (transient ischemic attack)    Tobacco abuse     PCP: Marvene Prentice SAUNDERS, FNP   Discharged Condition: good  Hospital Course:  Patient underwent the above stated procedure on 09/18/2023. Patient tolerated the procedure well and brought to the recovery room in good condition and subsequently to the floor. Patient had an uncomplicated hospital course and was stable for discharge.   Disposition: Discharge  disposition: 01-Home or Self Care      with follow up in 2 weeks    Follow-up Information     Gosling Selinda Dover, MD Follow up in 2 week(s).   Specialty: Orthopedic Surgery Why: For wound re-check, For suture removal Contact information: [address] [place] [address] [phone]                 Dental Antibiotics:  In most cases prophylactic antibiotics for Dental procdeures after total joint surgery are not necessary.  Exceptions are as follows:  1. History of prior total joint infection  2. Severely immunocompromised (Organ Transplant, cancer chemotherapy, Rheumatoid biologic meds such as Humera)  3. Poorly controlled diabetes (A1C &gt; 8.0, blood glucose over 200)  If you have one of these conditions, contact your surgeon for an antibiotic prescription, prior to your dental procedure.  Discharge Instructions     Call MD / Call 911   Complete by: As directed    If you experience chest pain or shortness of breath, CALL 911 and be transported to the hospital emergency room.  If you develope a fever above 101 F, pus (white drainage) or increased drainage or redness at the wound, or calf pain, call your surgeon's office.   Constipation Prevention   Complete by: As directed    Drink plenty of fluids.  Prune juice may be helpful.  You may use a stool softener, such as Colace (over the counter) 100 mg twice a day.  Use  MiraLax  (over the counter) for constipation as needed.   Diet - low sodium heart healthy   Complete by: As directed    Increase activity slowly as tolerated   Complete by: As directed    Post-operative opioid taper instructions:   Complete by: As directed    POST-OPERATIVE OPIOID TAPER INSTRUCTIONS: It is important to wean off of your opioid medication as soon as possible. If you do not need pain medication after your surgery it is ok to stop day one. Opioids include: Codeine, Hydrocodone (Norco, Vicodin),  Oxycodone (Percocet, oxycontin ) and hydromorphone  amongst others.  Long term and even short term use of opiods can cause: Increased pain response Dependence Constipation Depression Respiratory depression And more.  Withdrawal symptoms can include Flu like symptoms Nausea, vomiting And more Techniques to manage these symptoms Hydrate well Eat regular healthy meals Stay active Use relaxation techniques(deep breathing, meditating, yoga) Do Not substitute Alcohol to help with tapering If you have been on opioids for less than two weeks and do not have pain than it is ok to stop all together.  Plan to wean off of opioids This plan should start within one week post op of your joint replacement. Maintain the same interval or time between taking each dose and first decrease the dose.  Cut the total daily intake of opioids by one tablet each day Next start to increase the time between doses. The last dose that should be eliminated is the evening dose.          Allergies as of 09/20/2023       Reactions   Cefuroxime Other (See Comments)   Oral ulcers   Cephalexin  Other (See Comments)   Took off first layer of skin inside of mouth   Chlorhexidine  Other (See Comments)   Mouth broke out- oral ulcers   Prednisone  Other (See Comments)   Possible oral ulcers   Penicillins Rash   Pt reports questionable childhood mild reaction. Has patient had a PCN reaction causing immediate rash, facial/tongue/throat swelling, SOB or lightheadedness with hypotension: unsure Has patient had a PCN reaction causing severe rash involving mucus membranes or skin necrosis: unsure Has patient had a PCN reaction that required hospitalization No Has patient had a PCN reaction occurring within the last 10 years: Yes If all of the above answers are NO, then may proceed with Cephalosporin use.   Zithromax [azithromycin Dihydrate] Other (See Comments)   ORAL ULCERS        Medication List     TAKE these  medications    albuterol  108 (90 Base) MCG/ACT inhaler Commonly known as: VENTOLIN  HFA Inhale 2 puffs into the lungs every 6 (six) hours as needed for wheezing or shortness of breath.   amLODipine  10 MG tablet Commonly known as: NORVASC  Take 10 mg by mouth daily.   aspirin  EC 81 MG tablet Take 1 tablet (81 mg total) by mouth daily. Swallow whole.   Centrum Silver 50+Women Tabs Take 1 tablet by mouth daily with breakfast.   clopidogrel  75 MG tablet Commonly known as: PLAVIX  Take 75 mg by mouth daily.   gabapentin  600 MG tablet Commonly known as: NEURONTIN  Take 600 mg by mouth 2 (two) times daily as needed (pain).   losartan  50 MG tablet Commonly known as: COZAAR  TAKE 1 TABLET BY MOUTH EVERY DAY   metFORMIN  500 MG 24 hr tablet Commonly known as: GLUCOPHAGE -XR Take 500 mg by mouth daily with breakfast.   methocarbamol  500 MG tablet Commonly known as: ROBAXIN  Take 500  mg by mouth daily as needed for muscle spasms.   mupirocin  ointment 2 % Commonly known as: BACTROBAN  Place 1 Application into the nose 2 (two) times daily for 60 doses. Use as directed 2 times daily for 5 days every other week for 6 weeks.   ondansetron  4 MG tablet Commonly known as: ZOFRAN  Take 1 tablet (4 mg total) by mouth every 6 (six) hours as needed for nausea.   oxyCODONE  5 MG immediate release tablet Commonly known as: Roxicodone  Take 1 tablet (5 mg total) by mouth every 4 (four) hours as needed for moderate pain (pain score 4-6) or severe pain (pain score 7-10).   oxyCODONE -acetaminophen  10-325 MG tablet Commonly known as: PERCOCET Take 1 tablet by mouth every 6 (six) hours as needed for pain.   rosuvastatin  10 MG tablet Commonly known as: CRESTOR  TAKE 1 TABLET BY MOUTH EVERY DAY   sodium chloride  HYPERTONIC 3 % nebulizer solution Take 4 mLs by nebulization every 12 (twelve) hours.   TYLENOL  500 MG tablet Generic drug: acetaminophen  Take 1,000 mg by mouth every 6 (six) hours as needed  for headache or mild pain.          Signed: Debby KATHEE Fireman 09/20/2023, 8:12 AM  [facility] is now [employer] [address], [address], [place], [place] [address] Phone: [phone] Facebook  Instagram  [employer]

## 2023-09-20 NOTE — Discharge Planning (Signed)
 Patient alert. IV access removed. Dishcarge teaching given to [name] by Almira Coaster RN. Patient verbalized understanding of teaching. Discharge summary placed in discharge packet. Patient will be transported home by her daughter.

## 2023-09-20 NOTE — Progress Notes (Signed)
 I informed the pt that I needed to remove her Foley. She said no because she does not want to get up. She said what until it is time for her to go home. I told her it needs to come out and she needs to start walking. She said not now.

## 2023-09-20 NOTE — Plan of Care (Signed)
  Problem: Education: Goal: Knowledge of General Education information will improve Description: Including pain rating scale, medication(s)/side effects and non-pharmacologic comfort measures Outcome: Progressing   Problem: Health Behavior/Discharge Planning: Goal: Ability to manage health-related needs will improve Outcome: Progressing   Problem: Clinical Measurements: Goal: Ability to maintain clinical measurements within normal limits will improve Outcome: Progressing Goal: Will remain free from infection Outcome: Progressing Goal: Diagnostic test results will improve Outcome: Progressing Goal: Respiratory complications will improve Outcome: Progressing Goal: Cardiovascular complication will be avoided Outcome: Progressing   Problem: Activity: Goal: Risk for activity intolerance will decrease Outcome: Progressing   Problem: Nutrition: Goal: Adequate nutrition will be maintained Outcome: Progressing   Problem: Coping: Goal: Level of anxiety will decrease Outcome: Progressing   Problem: Elimination: Goal: Will not experience complications related to bowel motility Outcome: Progressing Goal: Will not experience complications related to urinary retention Outcome: Progressing   Problem: Pain Management: Goal: General experience of comfort will improve Outcome: Progressing   Problem: Safety: Goal: Ability to remain free from injury will improve Outcome: Progressing   Problem: Skin Integrity: Goal: Risk for impaired skin integrity will decrease Outcome: Progressing   Problem: Education: Goal: Ability to describe self-care measures that may prevent or decrease complications (Diabetes Survival Skills Education) will improve Outcome: Progressing Goal: Individualized Educational Video(s) Outcome: Progressing   Problem: Coping: Goal: Ability to adjust to condition or change in health will improve Outcome: Progressing   Problem: Fluid Volume: Goal: Ability to  maintain a balanced intake and output will improve Outcome: Progressing   Problem: Health Behavior/Discharge Planning: Goal: Ability to identify and utilize available resources and services will improve Outcome: Progressing Goal: Ability to manage health-related needs will improve Outcome: Progressing   Problem: Metabolic: Goal: Ability to maintain appropriate glucose levels will improve Outcome: Progressing   Problem: Nutritional: Goal: Maintenance of adequate nutrition will improve Outcome: Progressing Goal: Progress toward achieving an optimal weight will improve Outcome: Progressing   Problem: Skin Integrity: Goal: Risk for impaired skin integrity will decrease Outcome: Progressing   Problem: Tissue Perfusion: Goal: Adequacy of tissue perfusion will improve Outcome: Progressing   Problem: Education: Goal: Knowledge of the prescribed therapeutic regimen will improve Outcome: Progressing Goal: Understanding of activity limitations/precautions following surgery will improve Outcome: Progressing Goal: Individualized Educational Video(s) Outcome: Progressing   Problem: Activity: Goal: Ability to tolerate increased activity will improve Outcome: Progressing   Problem: Pain Management: Goal: Pain level will decrease with appropriate interventions Outcome: Progressing

## 2023-09-20 NOTE — Progress Notes (Signed)
 PROGRESS NOTE    [name]  FMW:[id number] DOB: 11/11/1945 DOA: 09/18/2023 PCP: Marvene Prentice SAUNDERS, FNP    Brief Narrative:  Patient admitted for elective left shoulder arthroplasty.  We are seeing for medical comanagement.  She does have a history of coronary artery disease, TIA, COPD, type 2 diabetes.  Subjective: Patient seen and examined.  Pain controlled.  No other issues.  Blood pressures adequate.  She is eager to go home. Assessment & Plan:    Shoulder arthroplasty: As per orthopedics. Type 2 diabetes: Resume metformin .  Very well-controlled.  A1c is 6.3. Essential hypertension: Blood pressure is stable.  Can resume losartan  and amlodipine  on discharge. Coronary artery disease: On aspirin  Plavix .  Can resume.  She is also on Crestor .  Medically stable for discharge.   DVT prophylaxis: SCD's Start: 09/18/23 1828   Code Status: Full code Family Communication: None at the bedside Disposition Plan: Status is: Observation Discharging today     Consultants:  TRH  Procedures:  Left shoulder reverse arthroplasty  Antimicrobials:  None     Objective: Vitals:   09/19/23 1643 09/19/23 1957 09/20/23 0500 09/20/23 0738  BP: 123/67 (!) 141/91 128/79 113/65  Pulse: 77 86 89 84  Resp: 16 18 17 17   Temp: 97.6 F (36.4 C) 98.6 F (37 C) 97.9 F (36.6 C) 99.2 F (37.3 C)  TempSrc:  Oral Axillary Oral  SpO2: 93% 95% 96% 94%  Weight:      Height:        Intake/Output Summary (Last 24 hours) at 09/20/2023 0908 Last data filed at 09/20/2023 0200 Gross per 24 hour  Intake 20 ml  Output 650 ml  Net -630 ml   Filed Weights   09/18/23 1101  Weight: 56.7 kg    Examination:  Looks fairly comfortable. Left shoulder on sling, swelling present.  Distal neurovascular status intact.    Data Reviewed: I have personally reviewed following labs and imaging studies  CBC: Recent Labs  Lab 09/19/23 0422  WBC 8.7  NEUTROABS 6.6  HGB 11.1*  HCT 33.3*  MCV 91.7   PLT 238   Basic Metabolic Panel: Recent Labs  Lab 09/19/23 0422  NA 133*  K 3.7  CL 100  CO2 24  GLUCOSE 111*  BUN 8  CREATININE 0.71  CALCIUM  8.4*  MG 1.7   GFR: Estimated Creatinine Clearance: 45.8 mL/min (by C-G formula based on SCr of 0.71 mg/dL). Liver Function Tests: Recent Labs  Lab 09/19/23 0422  AST 18  ALT 12  ALKPHOS 119  BILITOT 0.9  PROT 6.0*  ALBUMIN  2.6*   No results for input(s): LIPASE, AMYLASE in the last 168 hours. No results for input(s): AMMONIA in the last 168 hours. Coagulation Profile: No results for input(s): INR, PROTIME in the last 168 hours. Cardiac Enzymes: No results for input(s): CKTOTAL, CKMB, CKMBINDEX, TROPONINI in the last 168 hours. BNP (last 3 results) No results for input(s): PROBNP in the last 8760 hours. HbA1C: No results for input(s): HGBA1C in the last 72 hours. CBG: Recent Labs  Lab 09/19/23 0900 09/19/23 1144 09/19/23 1640 09/19/23 2156 09/20/23 0636  GLUCAP 119* 120* 148* 131* 110*   Lipid Profile: No results for input(s): CHOL, HDL, LDLCALC, TRIG, CHOLHDL, LDLDIRECT in the last 72 hours. Thyroid  Function Tests: No results for input(s): TSH, T4TOTAL, FREET4, T3FREE, THYROIDAB in the last 72 hours. Anemia Panel: No results for input(s): VITAMINB12, FOLATE, FERRITIN, TIBC, IRON, RETICCTPCT in the last 72 hours. Sepsis Labs: No results for  input(s): PROCALCITON, LATICACIDVEN in the last 168 hours.  Recent Results (from the past 240 hours)  Surgical pcr screen     Status: Abnormal   Collection Time: 09/11/23  1:46 PM   Specimen: Nasal Mucosa; Nasal Swab  Result Value Ref Range Status   MRSA, PCR NEGATIVE NEGATIVE Final   Staphylococcus aureus POSITIVE (A) NEGATIVE Final    Comment: (NOTE) The Xpert SA Assay (FDA approved for NASAL specimens in patients 40 years of age and older), is one component of a comprehensive surveillance program. It is not  intended to diagnose infection nor to guide or monitor treatment. Performed at [facility], [address], [place], [place] [address]          Radiology Studies: DG Shoulder Left Port Result Date: 09/18/2023 CLINICAL DATA:  Status post reversed total shoulder arthroplasty EXAM: LEFT SHOULDER COMPARISON:  None available FINDINGS: Changes of left shoulder replacement. No hardware complicating feature. Fracture fragments noted laterally in the region of the greater tuberosity. IMPRESSION: Left shoulder replacement. Fracture fragments noted laterally in the region of the greater tuberosity. Electronically Signed   By: Franky Crease M.D.   On: 09/18/2023 19:37        Scheduled Meds:  aspirin  EC  81 mg Oral Daily   clopidogrel   75 mg Oral Daily   docusate sodium   100 mg Oral BID   feeding supplement  237 mL Oral TID BM   insulin  aspart  0-5 Units Subcutaneous QHS   insulin  aspart  0-9 Units Subcutaneous TID WC   rosuvastatin   10 mg Oral Daily   Continuous Infusions:   LOS: 0 days    Time spent: 25 minutes    Renato Applebaum, MD [facility]

## 2023-09-20 NOTE — Progress Notes (Signed)
   Subjective: 2 Days Post-Op Procedure(s) (LRB): LEFT REVERSE TOTAL SHOULDER ARTHROPLASTY (Left)  Pt doing well Minimal pain Ready for d/c home Denies any new symptoms or issues Patient reports pain as mild.  Objective:   VITALS:   Vitals:   09/20/23 0500 09/20/23 0738  BP: 128/79 113/65  Pulse: 89 84  Resp: 17 17  Temp: 97.9 F (36.6 C) 99.2 F (37.3 C)  SpO2: 96% 94%    Left shoulder: incision healing well Dressing changed for new aquacel Nv intact distally Slight decreased sensation to left thumb Sling in place No rashes or signs of infection  LABS Recent Labs    09/19/23 0422  HGB 11.1*  HCT 33.3*  WBC 8.7  PLT 238    Recent Labs    09/19/23 0422  NA 133*  K 3.7  BUN 8  CREATININE 0.71  GLUCOSE 111*     Assessment/Plan: 2 Days Post-Op Procedure(s) (LRB): LEFT REVERSE TOTAL SHOULDER ARTHROPLASTY (Left) Plan for d/c later today F/u in the office in 2 weeks Pain management as needed   [name], MPAS [facility] is now [employer] [address], [place], [place] [address] Phone: [phone] [email or web address] Facebook  [employer]

## 2023-09-21 ENCOUNTER — Encounter (HOSPITAL_COMMUNITY): Payer: Self-pay | Admitting: Orthopedic Surgery

## 2023-09-24 ENCOUNTER — Ambulatory Visit: Payer: Medicare Other | Admitting: Podiatry

## 2023-09-25 ENCOUNTER — Ambulatory Visit (INDEPENDENT_AMBULATORY_CARE_PROVIDER_SITE_OTHER): Payer: Medicare Other | Admitting: Podiatry

## 2023-09-25 ENCOUNTER — Encounter: Payer: Self-pay | Admitting: Podiatry

## 2023-09-25 DIAGNOSIS — B351 Tinea unguium: Secondary | ICD-10-CM

## 2023-09-25 DIAGNOSIS — M79676 Pain in unspecified toe(s): Secondary | ICD-10-CM

## 2023-09-25 DIAGNOSIS — L84 Corns and callosities: Secondary | ICD-10-CM | POA: Diagnosis not present

## 2023-09-25 DIAGNOSIS — E119 Type 2 diabetes mellitus without complications: Secondary | ICD-10-CM

## 2023-09-25 NOTE — Progress Notes (Signed)
This patient returns to my office for at risk foot care.  This patient requires this care by a professional since this patient will be at risk due to having type 2 diabetes, raynauds disease and renal failure This patient is unable to cut nails herself since the patient cannot reach her nails.These nails are painful walking and wearing shoes.  This patient presents for at risk foot care today.  General Appearance  Alert, conversant and in no acute stress.  Vascular  Dorsalis pedis and posterior tibial  pulses are weakly palpable  bilaterally.  Capillary return is within normal limits  bilaterally. Temperature is within normal limits  bilaterally.  Neurologic  Senn-Weinstein monofilament wire test within normal limits  bilaterally. Muscle power within normal limits bilaterally.  Nails Thick disfigured discolored nails with subungual debris  from hallux to fifth toes bilaterally. No evidence of bacterial infection or drainage bilaterally.  Orthopedic  No limitations of motion  feet .  No crepitus or effusions noted.  No bony pathology or digital deformities noted.  Skin  normotropic skin with no porokeratosis noted bilaterally.  No signs of infections or ulcers noted.     Onychomycosis  Pain in right toes  Pain in left toes  Consent was obtained for treatment procedures.   Mechanical debridement of nails 1-5  bilaterally performed with a nail nipper.  Filed with dremel without incident.    Return office visit    3 months                  Told patient to return for periodic foot care and evaluation due to potential at risk complications.   Gardiner Barefoot DPM

## 2023-10-02 DIAGNOSIS — J449 Chronic obstructive pulmonary disease, unspecified: Secondary | ICD-10-CM | POA: Diagnosis not present

## 2023-10-02 DIAGNOSIS — R809 Proteinuria, unspecified: Secondary | ICD-10-CM | POA: Diagnosis not present

## 2023-10-02 DIAGNOSIS — E1121 Type 2 diabetes mellitus with diabetic nephropathy: Secondary | ICD-10-CM | POA: Diagnosis not present

## 2023-10-02 DIAGNOSIS — E1169 Type 2 diabetes mellitus with other specified complication: Secondary | ICD-10-CM | POA: Diagnosis not present

## 2023-10-02 DIAGNOSIS — I2510 Atherosclerotic heart disease of native coronary artery without angina pectoris: Secondary | ICD-10-CM | POA: Diagnosis not present

## 2023-10-02 DIAGNOSIS — Z0000 Encounter for general adult medical examination without abnormal findings: Secondary | ICD-10-CM | POA: Diagnosis not present

## 2023-10-02 DIAGNOSIS — Z8673 Personal history of transient ischemic attack (TIA), and cerebral infarction without residual deficits: Secondary | ICD-10-CM | POA: Diagnosis not present

## 2023-10-02 DIAGNOSIS — I10 Essential (primary) hypertension: Secondary | ICD-10-CM | POA: Diagnosis not present

## 2023-10-02 DIAGNOSIS — E782 Mixed hyperlipidemia: Secondary | ICD-10-CM | POA: Diagnosis not present

## 2023-10-03 DIAGNOSIS — M25512 Pain in left shoulder: Secondary | ICD-10-CM | POA: Diagnosis not present

## 2023-10-09 ENCOUNTER — Other Ambulatory Visit: Payer: Self-pay | Admitting: Family Medicine

## 2023-10-09 DIAGNOSIS — E2839 Other primary ovarian failure: Secondary | ICD-10-CM

## 2023-10-27 DIAGNOSIS — Z4789 Encounter for other orthopedic aftercare: Secondary | ICD-10-CM | POA: Diagnosis not present

## 2023-11-25 DIAGNOSIS — M25512 Pain in left shoulder: Secondary | ICD-10-CM | POA: Diagnosis not present

## 2023-12-25 ENCOUNTER — Encounter: Payer: Self-pay | Admitting: Podiatry

## 2023-12-25 ENCOUNTER — Ambulatory Visit: Payer: Medicare Other | Admitting: Podiatry

## 2023-12-25 DIAGNOSIS — M79676 Pain in unspecified toe(s): Secondary | ICD-10-CM

## 2023-12-25 DIAGNOSIS — B351 Tinea unguium: Secondary | ICD-10-CM | POA: Diagnosis not present

## 2023-12-25 DIAGNOSIS — E119 Type 2 diabetes mellitus without complications: Secondary | ICD-10-CM
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
## 2023-12-25 NOTE — Progress Notes (Signed)
This patient returns to my office for at risk foot care.  This patient requires this care by a professional since this patient will be at risk due to having type 2 diabetes, raynauds disease and renal failure This patient is unable to cut nails herself since the patient cannot reach her nails.These nails are painful walking and wearing shoes.  This patient presents for at risk foot care today.  General Appearance  Alert, conversant and in no acute stress.  Vascular  Dorsalis pedis and posterior tibial  pulses are weakly palpable  bilaterally.  Capillary return is within normal limits  bilaterally. Temperature is within normal limits  bilaterally.  Neurologic  Senn-Weinstein monofilament wire test within normal limits  bilaterally. Muscle power within normal limits bilaterally.  Nails Thick disfigured discolored nails with subungual debris  from hallux to fifth toes bilaterally. No evidence of bacterial infection or drainage bilaterally.  Orthopedic  No limitations of motion  feet .  No crepitus or effusions noted.  No bony pathology or digital deformities noted.  Skin  normotropic skin with no porokeratosis noted bilaterally.  No signs of infections or ulcers noted.     Onychomycosis  Pain in right toes  Pain in left toes  Consent was obtained for treatment procedures.   Mechanical debridement of nails 1-5  bilaterally performed with a nail nipper.  Filed with dremel without incident.    Return office visit    3 months                  Told patient to return for periodic foot care and evaluation due to potential at risk complications.   Gardiner Barefoot DPM

## 2023-12-30 ENCOUNTER — Ambulatory Visit: Payer: Medicare Other | Attending: Cardiology | Admitting: Cardiology

## 2023-12-30 ENCOUNTER — Encounter: Payer: Self-pay | Admitting: Cardiology

## 2023-12-30 VITALS — BP 100/62 | Ht 61.0 in | Wt 129.0 lb

## 2023-12-30 DIAGNOSIS — I2510 Atherosclerotic heart disease of native coronary artery without angina pectoris: Secondary | ICD-10-CM | POA: Diagnosis not present

## 2023-12-30 DIAGNOSIS — I10 Essential (primary) hypertension: Secondary | ICD-10-CM

## 2023-12-30 DIAGNOSIS — Z720 Tobacco use: Secondary | ICD-10-CM | POA: Diagnosis not present

## 2023-12-30 DIAGNOSIS — I480 Paroxysmal atrial fibrillation: Secondary | ICD-10-CM

## 2023-12-30 MED ORDER — ROSUVASTATIN CALCIUM 10 MG PO TABS: 10.0000 mg | ORAL_TABLET | Freq: Every day | ORAL | 0 refills | Status: DC

## 2023-12-30 NOTE — Patient Instructions (Signed)
 Medication Instructions:  Continue same medications *If you need a refill on your cardiac medications before your next appointment, please call your pharmacy*  Lab Work: None ordered  Testing/Procedures: None ordered  Follow-Up: At [facility], you and your health needs are our priority.  As part of our continuing mission to provide you with exceptional heart care, our providers are all part of one team.  This team includes your primary Cardiologist (physician) and Advanced Practice Providers or APPs (Physician Assistants and Nurse Practitioners) who all work together to provide you with the care you need, when you need it.  Your next appointment:  6 months   Call in July to schedule Oct appointment     Provider:  Dr.Jordan  We recommend signing up for the patient portal called "MyChart".  Sign up information is provided on this After Visit Summary.  MyChart is used to connect with patients for Virtual Visits (Telemedicine).  Patients are able to view lab/test results, encounter notes, upcoming appointments, etc.  Non-urgent messages can be sent to your provider as well.   To learn more about what you can do with MyChart, go to [email or web address].         1st Floor: - Lobby - Registration  - Pharmacy  - Lab - Cafe  2nd Floor: - PV Lab - Diagnostic Testing (echo, CT, nuclear med)  3rd Floor: - Vacant  4th Floor: - TCTS (cardiothoracic surgery) - AFib Clinic - Structural Heart Clinic - Vascular Surgery  - Vascular Ultrasound  5th Floor: - [facility] (general and EP) - Clinical Pharmacy for coumadin, hypertension, lipid, weight-loss medications, and med management appointments    Valet parking services will be available as well.

## 2024-01-14 DIAGNOSIS — Z4789 Encounter for other orthopedic aftercare: Secondary | ICD-10-CM | POA: Diagnosis not present

## 2024-02-16 DIAGNOSIS — Z96612 Presence of left artificial shoulder joint: Secondary | ICD-10-CM | POA: Diagnosis not present

## 2024-04-14 ENCOUNTER — Ambulatory Visit: Admitting: Podiatry

## 2024-05-04 DIAGNOSIS — M545 Low back pain, unspecified: Secondary | ICD-10-CM | POA: Diagnosis not present

## 2024-06-14 DIAGNOSIS — M545 Low back pain, unspecified: Secondary | ICD-10-CM | POA: Diagnosis not present

## 2024-06-30 ENCOUNTER — Other Ambulatory Visit: Payer: Self-pay | Admitting: Cardiology

## 2024-07-27 ENCOUNTER — Encounter: Payer: Self-pay | Admitting: Podiatry

## 2024-07-27 ENCOUNTER — Ambulatory Visit: Admitting: Podiatry

## 2024-07-27 DIAGNOSIS — B351 Tinea unguium: Secondary | ICD-10-CM

## 2024-07-27 DIAGNOSIS — M79676 Pain in unspecified toe(s): Secondary | ICD-10-CM | POA: Diagnosis not present

## 2024-07-27 DIAGNOSIS — E119 Type 2 diabetes mellitus without complications: Secondary | ICD-10-CM | POA: Diagnosis not present

## 2024-07-27 NOTE — Progress Notes (Signed)
This patient returns to my office for at risk foot care.  This patient requires this care by a professional since this patient will be at risk due to having type 2 diabetes, raynauds disease and renal failure This patient is unable to cut nails herself since the patient cannot reach her nails.These nails are painful walking and wearing shoes.  This patient presents for at risk foot care today.  General Appearance  Alert, conversant and in no acute stress.  Vascular  Dorsalis pedis and posterior tibial  pulses are weakly palpable  bilaterally.  Capillary return is within normal limits  bilaterally. Temperature is within normal limits  bilaterally.  Neurologic  Senn-Weinstein monofilament wire test within normal limits  bilaterally. Muscle power within normal limits bilaterally.  Nails Thick disfigured discolored nails with subungual debris  from hallux to fifth toes bilaterally. No evidence of bacterial infection or drainage bilaterally.  Orthopedic  No limitations of motion  feet .  No crepitus or effusions noted.  No bony pathology or digital deformities noted.  Skin  normotropic skin with no porokeratosis noted bilaterally.  No signs of infections or ulcers noted.     Onychomycosis  Pain in right toes  Pain in left toes  Consent was obtained for treatment procedures.   Mechanical debridement of nails 1-5  bilaterally performed with a nail nipper.  Filed with dremel without incident.    Return office visit    3 months                  Told patient to return for periodic foot care and evaluation due to potential at risk complications.   Gardiner Barefoot DPM

## 2024-09-28 ENCOUNTER — Other Ambulatory Visit: Payer: Self-pay | Admitting: Cardiology

## 2024-09-29 NOTE — Telephone Encounter (Signed)
 In accordance with refill protocols, please review and address the following requirements before this medication refill can be authorized:  Labs

## 2024-10-27 ENCOUNTER — Ambulatory Visit: Admitting: Podiatry

## 2024-10-28 ENCOUNTER — Ambulatory Visit: Admitting: Cardiology
